# Patient Record
Sex: Male | Born: 1950 | Race: White | Hispanic: No | Marital: Single | Smoking: Former smoker
Health system: Southern US, Community
[De-identification: ages and names within clinical notes are randomized; demographics above are authoritative.]

## PROBLEM LIST (undated history)

## (undated) DIAGNOSIS — R7303 Prediabetes: Principal | ICD-10-CM

## (undated) DIAGNOSIS — R0600 Dyspnea, unspecified: Secondary | ICD-10-CM

## (undated) DIAGNOSIS — J189 Pneumonia, unspecified organism: Secondary | ICD-10-CM

## (undated) DIAGNOSIS — M1711 Unilateral primary osteoarthritis, right knee: Secondary | ICD-10-CM

## (undated) DIAGNOSIS — I25119 Atherosclerotic heart disease of native coronary artery with unspecified angina pectoris: Secondary | ICD-10-CM

## (undated) DIAGNOSIS — I5022 Chronic systolic (congestive) heart failure: Secondary | ICD-10-CM

## (undated) DIAGNOSIS — I2510 Atherosclerotic heart disease of native coronary artery without angina pectoris: Secondary | ICD-10-CM

## (undated) DIAGNOSIS — N5089 Other specified disorders of the male genital organs: Secondary | ICD-10-CM

## (undated) DIAGNOSIS — N400 Enlarged prostate without lower urinary tract symptoms: Secondary | ICD-10-CM

## (undated) DIAGNOSIS — I499 Cardiac arrhythmia, unspecified: Secondary | ICD-10-CM

## (undated) DIAGNOSIS — Z8719 Personal history of other diseases of the digestive system: Secondary | ICD-10-CM

## (undated) DIAGNOSIS — E785 Hyperlipidemia, unspecified: Secondary | ICD-10-CM

## (undated) DIAGNOSIS — N183 Chronic kidney disease, stage 3 unspecified: Secondary | ICD-10-CM

## (undated) DIAGNOSIS — M1990 Unspecified osteoarthritis, unspecified site: Secondary | ICD-10-CM

## (undated) DIAGNOSIS — I10 Essential (primary) hypertension: Secondary | ICD-10-CM

## (undated) HISTORY — DX: Prediabetes: R73.03

## (undated) HISTORY — DX: Personal history of other diseases of the digestive system: Z87.19

## (undated) HISTORY — DX: Atherosclerotic heart disease of native coronary artery with unspecified angina pectoris: I25.119

## (undated) HISTORY — DX: Other specified disorders of the male genital organs: N50.89

## (undated) HISTORY — DX: Unilateral primary osteoarthritis, right knee: M17.11

## (undated) HISTORY — DX: Atherosclerotic heart disease of native coronary artery without angina pectoris: I25.10

---

## 1989-01-28 HISTORY — PX: INGUINAL HERNIA REPAIR: SUR1180

## 2001-02-06 ENCOUNTER — Encounter: Payer: Self-pay | Admitting: Emergency Medicine

## 2001-02-06 ENCOUNTER — Emergency Department (HOSPITAL_COMMUNITY): Admission: EM | Admit: 2001-02-06 | Discharge: 2001-02-06 | Payer: Self-pay | Admitting: Emergency Medicine

## 2001-02-07 ENCOUNTER — Encounter: Payer: Self-pay | Admitting: *Deleted

## 2001-02-07 ENCOUNTER — Inpatient Hospital Stay (HOSPITAL_COMMUNITY): Admission: EM | Admit: 2001-02-07 | Discharge: 2001-02-12 | Payer: Self-pay | Admitting: Emergency Medicine

## 2004-11-08 ENCOUNTER — Emergency Department (HOSPITAL_COMMUNITY): Admission: EM | Admit: 2004-11-08 | Discharge: 2004-11-08 | Payer: Self-pay | Admitting: Emergency Medicine

## 2004-12-15 ENCOUNTER — Ambulatory Visit: Payer: Self-pay | Admitting: *Deleted

## 2004-12-15 ENCOUNTER — Ambulatory Visit: Payer: Self-pay | Admitting: Internal Medicine

## 2004-12-29 ENCOUNTER — Ambulatory Visit: Payer: Self-pay | Admitting: Internal Medicine

## 2005-01-21 ENCOUNTER — Ambulatory Visit: Payer: Self-pay | Admitting: Internal Medicine

## 2005-02-09 ENCOUNTER — Encounter: Payer: Self-pay | Admitting: Cardiology

## 2005-02-09 ENCOUNTER — Ambulatory Visit: Payer: Self-pay | Admitting: Cardiology

## 2005-02-09 ENCOUNTER — Ambulatory Visit (HOSPITAL_COMMUNITY): Admission: RE | Admit: 2005-02-09 | Discharge: 2005-02-09 | Payer: Self-pay | Admitting: Internal Medicine

## 2005-03-25 ENCOUNTER — Ambulatory Visit: Payer: Self-pay | Admitting: Internal Medicine

## 2005-03-25 LAB — CONVERTED CEMR LAB: PSA: 4.14 ng/mL

## 2005-05-20 ENCOUNTER — Ambulatory Visit: Payer: Self-pay | Admitting: Internal Medicine

## 2005-05-31 ENCOUNTER — Ambulatory Visit: Payer: Self-pay | Admitting: Internal Medicine

## 2005-06-23 ENCOUNTER — Ambulatory Visit: Payer: Self-pay | Admitting: Internal Medicine

## 2005-06-23 LAB — CONVERTED CEMR LAB: PSA: 4.71 ng/mL

## 2005-10-24 ENCOUNTER — Emergency Department (HOSPITAL_COMMUNITY): Admission: EM | Admit: 2005-10-24 | Discharge: 2005-10-24 | Payer: Self-pay | Admitting: Emergency Medicine

## 2006-02-20 ENCOUNTER — Ambulatory Visit: Payer: Self-pay | Admitting: Internal Medicine

## 2006-02-20 LAB — CONVERTED CEMR LAB
Hgb A1c MFr Bld: 5.4 %
Microalbumin U total vol: 3.54 mg/L

## 2006-03-22 ENCOUNTER — Ambulatory Visit: Payer: Self-pay | Admitting: Internal Medicine

## 2006-03-27 ENCOUNTER — Ambulatory Visit: Payer: Self-pay | Admitting: Internal Medicine

## 2006-05-08 ENCOUNTER — Ambulatory Visit: Payer: Self-pay | Admitting: Internal Medicine

## 2006-08-16 ENCOUNTER — Emergency Department (HOSPITAL_COMMUNITY): Admission: EM | Admit: 2006-08-16 | Discharge: 2006-08-16 | Payer: Self-pay | Admitting: Emergency Medicine

## 2006-09-25 ENCOUNTER — Ambulatory Visit: Payer: Self-pay | Admitting: Internal Medicine

## 2006-09-25 LAB — CONVERTED CEMR LAB: Triglycerides: 556 mg/dL

## 2006-12-31 ENCOUNTER — Emergency Department (HOSPITAL_COMMUNITY): Admission: EM | Admit: 2006-12-31 | Discharge: 2006-12-31 | Payer: Self-pay | Admitting: Emergency Medicine

## 2007-01-04 ENCOUNTER — Encounter (INDEPENDENT_AMBULATORY_CARE_PROVIDER_SITE_OTHER): Payer: Self-pay | Admitting: Internal Medicine

## 2007-01-04 ENCOUNTER — Ambulatory Visit: Payer: Self-pay | Admitting: Internal Medicine

## 2007-01-10 ENCOUNTER — Ambulatory Visit: Payer: Self-pay | Admitting: Internal Medicine

## 2007-01-16 ENCOUNTER — Encounter (INDEPENDENT_AMBULATORY_CARE_PROVIDER_SITE_OTHER): Payer: Self-pay | Admitting: Internal Medicine

## 2007-01-16 DIAGNOSIS — E119 Type 2 diabetes mellitus without complications: Secondary | ICD-10-CM | POA: Insufficient documentation

## 2007-01-16 DIAGNOSIS — I10 Essential (primary) hypertension: Secondary | ICD-10-CM

## 2007-01-16 DIAGNOSIS — K8020 Calculus of gallbladder without cholecystitis without obstruction: Secondary | ICD-10-CM | POA: Insufficient documentation

## 2007-01-16 DIAGNOSIS — I509 Heart failure, unspecified: Secondary | ICD-10-CM

## 2007-01-16 DIAGNOSIS — E785 Hyperlipidemia, unspecified: Secondary | ICD-10-CM

## 2007-01-16 DIAGNOSIS — K5730 Diverticulosis of large intestine without perforation or abscess without bleeding: Secondary | ICD-10-CM | POA: Insufficient documentation

## 2007-01-16 DIAGNOSIS — N400 Enlarged prostate without lower urinary tract symptoms: Secondary | ICD-10-CM | POA: Insufficient documentation

## 2007-01-17 ENCOUNTER — Ambulatory Visit: Payer: Self-pay | Admitting: Internal Medicine

## 2007-02-06 DIAGNOSIS — F329 Major depressive disorder, single episode, unspecified: Secondary | ICD-10-CM

## 2007-02-14 ENCOUNTER — Encounter (INDEPENDENT_AMBULATORY_CARE_PROVIDER_SITE_OTHER): Payer: Self-pay | Admitting: *Deleted

## 2007-03-06 ENCOUNTER — Telehealth (INDEPENDENT_AMBULATORY_CARE_PROVIDER_SITE_OTHER): Payer: Self-pay | Admitting: *Deleted

## 2007-03-13 ENCOUNTER — Ambulatory Visit: Payer: Self-pay | Admitting: Internal Medicine

## 2007-03-13 DIAGNOSIS — F528 Other sexual dysfunction not due to a substance or known physiological condition: Secondary | ICD-10-CM | POA: Insufficient documentation

## 2007-03-22 ENCOUNTER — Telehealth (INDEPENDENT_AMBULATORY_CARE_PROVIDER_SITE_OTHER): Payer: Self-pay | Admitting: Internal Medicine

## 2007-03-28 ENCOUNTER — Ambulatory Visit: Payer: Self-pay | Admitting: Internal Medicine

## 2007-05-02 ENCOUNTER — Encounter (INDEPENDENT_AMBULATORY_CARE_PROVIDER_SITE_OTHER): Payer: Self-pay | Admitting: Internal Medicine

## 2007-08-21 DIAGNOSIS — Z8719 Personal history of other diseases of the digestive system: Secondary | ICD-10-CM

## 2007-08-21 DIAGNOSIS — E039 Hypothyroidism, unspecified: Secondary | ICD-10-CM | POA: Insufficient documentation

## 2007-08-21 HISTORY — DX: Personal history of other diseases of the digestive system: Z87.19

## 2007-08-22 ENCOUNTER — Inpatient Hospital Stay (HOSPITAL_COMMUNITY): Admission: EM | Admit: 2007-08-22 | Discharge: 2007-08-24 | Payer: Self-pay | Admitting: Emergency Medicine

## 2007-08-24 ENCOUNTER — Encounter (INDEPENDENT_AMBULATORY_CARE_PROVIDER_SITE_OTHER): Payer: Self-pay | Admitting: Internal Medicine

## 2007-10-02 ENCOUNTER — Ambulatory Visit: Payer: Self-pay | Admitting: Internal Medicine

## 2007-10-02 DIAGNOSIS — G4700 Insomnia, unspecified: Secondary | ICD-10-CM

## 2007-10-02 LAB — CONVERTED CEMR LAB: Hgb A1c MFr Bld: 5.5 %

## 2007-10-14 LAB — CONVERTED CEMR LAB
Calcium: 9.2 mg/dL (ref 8.4–10.5)
Creatinine, Ser: 1.24 mg/dL (ref 0.40–1.50)
Glucose, Bld: 124 mg/dL — ABNORMAL HIGH (ref 70–99)
Potassium: 3.7 meq/L (ref 3.5–5.3)

## 2007-10-15 ENCOUNTER — Encounter (INDEPENDENT_AMBULATORY_CARE_PROVIDER_SITE_OTHER): Payer: Self-pay | Admitting: *Deleted

## 2007-10-16 ENCOUNTER — Ambulatory Visit: Payer: Self-pay | Admitting: Internal Medicine

## 2007-10-29 ENCOUNTER — Telehealth (INDEPENDENT_AMBULATORY_CARE_PROVIDER_SITE_OTHER): Payer: Self-pay | Admitting: Internal Medicine

## 2008-02-02 ENCOUNTER — Ambulatory Visit: Payer: Self-pay | Admitting: Internal Medicine

## 2008-02-02 ENCOUNTER — Observation Stay (HOSPITAL_COMMUNITY): Admission: EM | Admit: 2008-02-02 | Discharge: 2008-02-04 | Payer: Self-pay | Admitting: Emergency Medicine

## 2008-02-05 ENCOUNTER — Telehealth (INDEPENDENT_AMBULATORY_CARE_PROVIDER_SITE_OTHER): Payer: Self-pay | Admitting: *Deleted

## 2008-02-10 ENCOUNTER — Encounter (INDEPENDENT_AMBULATORY_CARE_PROVIDER_SITE_OTHER): Payer: Self-pay | Admitting: Internal Medicine

## 2008-02-19 ENCOUNTER — Ambulatory Visit: Payer: Self-pay | Admitting: Internal Medicine

## 2008-02-19 LAB — CONVERTED CEMR LAB
Blood Glucose, Fingerstick: 160
Calcium: 9.7 mg/dL (ref 8.4–10.5)
Glucose, Bld: 142 mg/dL — ABNORMAL HIGH (ref 70–99)
Hgb A1c MFr Bld: 5.8 %
Sodium: 145 meq/L (ref 135–145)
TSH: 1.048 microintl units/mL (ref 0.350–4.50)

## 2008-02-26 ENCOUNTER — Telehealth (INDEPENDENT_AMBULATORY_CARE_PROVIDER_SITE_OTHER): Payer: Self-pay | Admitting: Internal Medicine

## 2008-03-06 ENCOUNTER — Telehealth (INDEPENDENT_AMBULATORY_CARE_PROVIDER_SITE_OTHER): Payer: Self-pay | Admitting: Internal Medicine

## 2008-03-27 ENCOUNTER — Ambulatory Visit: Payer: Self-pay | Admitting: Internal Medicine

## 2008-03-31 ENCOUNTER — Telehealth (INDEPENDENT_AMBULATORY_CARE_PROVIDER_SITE_OTHER): Payer: Self-pay | Admitting: Internal Medicine

## 2008-04-30 ENCOUNTER — Ambulatory Visit: Payer: Self-pay | Admitting: Internal Medicine

## 2008-04-30 LAB — CONVERTED CEMR LAB
Albumin: 4.9 g/dL (ref 3.5–5.2)
Alkaline Phosphatase: 66 units/L (ref 39–117)
BUN: 18 mg/dL (ref 6–23)
Calcium: 9.5 mg/dL (ref 8.4–10.5)
Cholesterol: 149 mg/dL (ref 0–200)
Creatinine, Ser: 1.17 mg/dL (ref 0.40–1.50)
Glucose, Bld: 152 mg/dL — ABNORMAL HIGH (ref 70–99)
LDL Cholesterol: 82 mg/dL (ref 0–99)
Microalb, Ur: 4.33 mg/dL — ABNORMAL HIGH (ref 0.00–1.89)
Total Bilirubin: 0.8 mg/dL (ref 0.3–1.2)
Total CHOL/HDL Ratio: 4
Triglycerides: 152 mg/dL — ABNORMAL HIGH (ref <150)

## 2008-05-02 ENCOUNTER — Ambulatory Visit: Payer: Self-pay | Admitting: Internal Medicine

## 2008-05-02 DIAGNOSIS — H3530 Unspecified macular degeneration: Secondary | ICD-10-CM | POA: Insufficient documentation

## 2008-05-09 ENCOUNTER — Ambulatory Visit (HOSPITAL_COMMUNITY): Admission: RE | Admit: 2008-05-09 | Discharge: 2008-05-09 | Payer: Self-pay | Admitting: Internal Medicine

## 2008-05-09 ENCOUNTER — Ambulatory Visit: Payer: Self-pay | Admitting: Cardiovascular Disease

## 2008-05-09 ENCOUNTER — Encounter (INDEPENDENT_AMBULATORY_CARE_PROVIDER_SITE_OTHER): Payer: Self-pay | Admitting: Internal Medicine

## 2008-05-14 ENCOUNTER — Telehealth (INDEPENDENT_AMBULATORY_CARE_PROVIDER_SITE_OTHER): Payer: Self-pay | Admitting: Internal Medicine

## 2008-05-14 ENCOUNTER — Encounter (INDEPENDENT_AMBULATORY_CARE_PROVIDER_SITE_OTHER): Payer: Self-pay | Admitting: Internal Medicine

## 2008-05-26 ENCOUNTER — Ambulatory Visit: Payer: Self-pay | Admitting: Cardiology

## 2008-06-06 ENCOUNTER — Ambulatory Visit (HOSPITAL_COMMUNITY): Admission: RE | Admit: 2008-06-06 | Discharge: 2008-06-06 | Payer: Self-pay | Admitting: Cardiology

## 2008-06-06 ENCOUNTER — Ambulatory Visit: Payer: Self-pay | Admitting: Cardiology

## 2008-07-23 ENCOUNTER — Telehealth (INDEPENDENT_AMBULATORY_CARE_PROVIDER_SITE_OTHER): Payer: Self-pay | Admitting: Internal Medicine

## 2008-10-30 ENCOUNTER — Encounter (INDEPENDENT_AMBULATORY_CARE_PROVIDER_SITE_OTHER): Payer: Self-pay | Admitting: Internal Medicine

## 2008-12-01 ENCOUNTER — Emergency Department (HOSPITAL_COMMUNITY): Admission: EM | Admit: 2008-12-01 | Discharge: 2008-12-01 | Payer: Self-pay | Admitting: Emergency Medicine

## 2008-12-02 ENCOUNTER — Telehealth (INDEPENDENT_AMBULATORY_CARE_PROVIDER_SITE_OTHER): Payer: Self-pay | Admitting: Internal Medicine

## 2008-12-15 ENCOUNTER — Telehealth (INDEPENDENT_AMBULATORY_CARE_PROVIDER_SITE_OTHER): Payer: Self-pay | Admitting: Internal Medicine

## 2008-12-15 ENCOUNTER — Ambulatory Visit: Payer: Self-pay | Admitting: Internal Medicine

## 2008-12-16 ENCOUNTER — Encounter (INDEPENDENT_AMBULATORY_CARE_PROVIDER_SITE_OTHER): Payer: Self-pay | Admitting: Internal Medicine

## 2008-12-16 LAB — CONVERTED CEMR LAB
Basophils Absolute: 0 10*3/uL (ref 0.0–0.1)
Lymphocytes Relative: 15 % (ref 12–46)
Lymphs Abs: 1.1 10*3/uL (ref 0.7–4.0)
MCHC: 32.7 g/dL (ref 30.0–36.0)
MCV: 88.2 fL (ref 78.0–100.0)
Monocytes Relative: 8 % (ref 3–12)
Neutrophils Relative %: 76 % (ref 43–77)
RBC: 5.1 M/uL (ref 4.22–5.81)
RDW: 14.8 % (ref 11.5–15.5)
WBC: 7.3 10*3/uL (ref 4.0–10.5)

## 2008-12-26 ENCOUNTER — Ambulatory Visit: Payer: Self-pay | Admitting: Internal Medicine

## 2008-12-26 DIAGNOSIS — R3129 Other microscopic hematuria: Secondary | ICD-10-CM

## 2008-12-26 LAB — CONVERTED CEMR LAB
Bacteria, UA: NONE SEEN
Blood in Urine, dipstick: NEGATIVE
Glucose, Urine, Semiquant: NEGATIVE
Hgb A1c MFr Bld: 5.6 %
Ketones, urine, test strip: NEGATIVE
RBC / HPF: NONE SEEN (ref <3)
WBC, UA: NONE SEEN cells/hpf (ref <3)

## 2009-01-13 ENCOUNTER — Emergency Department (HOSPITAL_COMMUNITY): Admission: EM | Admit: 2009-01-13 | Discharge: 2009-01-13 | Payer: Self-pay | Admitting: Emergency Medicine

## 2009-01-26 ENCOUNTER — Ambulatory Visit: Payer: Self-pay | Admitting: Cardiology

## 2009-02-02 LAB — CONVERTED CEMR LAB
GFR calc non Af Amer: 65.5 mL/min (ref >60)
Potassium: 3.7 meq/L (ref 3.5–5.1)
Sodium: 140 meq/L (ref 135–145)

## 2009-02-06 ENCOUNTER — Ambulatory Visit: Payer: Self-pay | Admitting: Internal Medicine

## 2009-02-23 LAB — CONVERTED CEMR LAB
ALT: 20 units/L (ref 0–53)
AST: 20 units/L (ref 0–37)
Bilirubin, Direct: 0.3 mg/dL (ref 0.0–0.3)
Cholesterol: 88 mg/dL (ref 0–200)
HDL: 41 mg/dL (ref >39)
Total CHOL/HDL Ratio: 2.1
Triglycerides: 180 mg/dL — ABNORMAL HIGH (ref <150)

## 2009-03-02 ENCOUNTER — Ambulatory Visit: Payer: Self-pay | Admitting: Internal Medicine

## 2009-03-11 ENCOUNTER — Emergency Department (HOSPITAL_COMMUNITY): Admission: EM | Admit: 2009-03-11 | Discharge: 2009-03-11 | Payer: Self-pay | Admitting: Emergency Medicine

## 2009-03-24 ENCOUNTER — Encounter: Admission: RE | Admit: 2009-03-24 | Discharge: 2009-03-24 | Payer: Self-pay | Admitting: Chiropractic Medicine

## 2009-03-26 ENCOUNTER — Ambulatory Visit: Payer: Self-pay | Admitting: Cardiology

## 2009-04-27 ENCOUNTER — Ambulatory Visit: Payer: Self-pay | Admitting: Internal Medicine

## 2009-05-12 ENCOUNTER — Ambulatory Visit: Payer: Self-pay | Admitting: Cardiology

## 2009-05-18 LAB — CONVERTED CEMR LAB
Cholesterol: 108 mg/dL (ref 0–200)
VLDL: 35 mg/dL (ref 0–40)

## 2009-07-06 ENCOUNTER — Telehealth (INDEPENDENT_AMBULATORY_CARE_PROVIDER_SITE_OTHER): Payer: Self-pay | Admitting: Internal Medicine

## 2009-07-14 ENCOUNTER — Encounter (INDEPENDENT_AMBULATORY_CARE_PROVIDER_SITE_OTHER): Payer: Self-pay | Admitting: *Deleted

## 2009-07-24 ENCOUNTER — Ambulatory Visit: Payer: Self-pay | Admitting: Internal Medicine

## 2009-08-03 LAB — CONVERTED CEMR LAB
AST: 14 units/L (ref 0–37)
Albumin: 4.6 g/dL (ref 3.5–5.2)
Alkaline Phosphatase: 75 units/L (ref 39–117)
HDL: 37 mg/dL — ABNORMAL LOW (ref >39)
LDL Cholesterol: 21 mg/dL (ref 0–99)
Total Protein: 7.1 g/dL (ref 6.0–8.3)
Triglycerides: 134 mg/dL (ref <150)

## 2009-10-13 ENCOUNTER — Telehealth (INDEPENDENT_AMBULATORY_CARE_PROVIDER_SITE_OTHER): Payer: Self-pay | Admitting: Internal Medicine

## 2010-01-26 ENCOUNTER — Ambulatory Visit: Payer: Self-pay | Admitting: Internal Medicine

## 2010-01-26 ENCOUNTER — Telehealth (INDEPENDENT_AMBULATORY_CARE_PROVIDER_SITE_OTHER): Payer: Self-pay | Admitting: Internal Medicine

## 2010-01-26 DIAGNOSIS — M25569 Pain in unspecified knee: Secondary | ICD-10-CM

## 2010-01-26 DIAGNOSIS — R011 Cardiac murmur, unspecified: Secondary | ICD-10-CM

## 2010-02-03 ENCOUNTER — Ambulatory Visit (HOSPITAL_COMMUNITY): Admission: RE | Admit: 2010-02-03 | Discharge: 2010-02-03 | Payer: Self-pay | Admitting: Internal Medicine

## 2010-02-03 ENCOUNTER — Ambulatory Visit: Payer: Self-pay | Admitting: Internal Medicine

## 2010-02-09 ENCOUNTER — Telehealth (INDEPENDENT_AMBULATORY_CARE_PROVIDER_SITE_OTHER): Payer: Self-pay | Admitting: Internal Medicine

## 2010-02-12 ENCOUNTER — Ambulatory Visit: Payer: Self-pay | Admitting: Cardiology

## 2010-02-15 ENCOUNTER — Telehealth (INDEPENDENT_AMBULATORY_CARE_PROVIDER_SITE_OTHER): Payer: Self-pay | Admitting: Internal Medicine

## 2010-02-15 LAB — CONVERTED CEMR LAB
ALT: 14 units/L (ref 0–53)
AST: 14 units/L (ref 0–37)
Albumin: 4.6 g/dL (ref 3.5–5.2)
Basophils Absolute: 0 10*3/uL (ref 0.0–0.1)
Basophils Relative: 0 % (ref 0–1)
Calcium: 9.3 mg/dL (ref 8.4–10.5)
Chloride: 102 meq/L (ref 96–112)
MCHC: 34.8 g/dL (ref 30.0–36.0)
Neutro Abs: 7.5 10*3/uL (ref 1.7–7.7)
Neutrophils Relative %: 73 % (ref 43–77)
PSA: 1.76 ng/mL (ref 0.10–4.00)
Platelets: 148 10*3/uL — ABNORMAL LOW (ref 150–400)
Potassium: 3.9 meq/L (ref 3.5–5.3)
RBC: 4.9 M/uL (ref 4.22–5.81)
RDW: 15.1 % (ref 11.5–15.5)
Sodium: 143 meq/L (ref 135–145)
Total CHOL/HDL Ratio: 2.1
VLDL: 39 mg/dL (ref 0–40)

## 2010-03-09 ENCOUNTER — Ambulatory Visit: Payer: Self-pay | Admitting: Internal Medicine

## 2010-04-08 ENCOUNTER — Telehealth (INDEPENDENT_AMBULATORY_CARE_PROVIDER_SITE_OTHER): Payer: Self-pay | Admitting: Internal Medicine

## 2010-04-16 ENCOUNTER — Ambulatory Visit: Payer: Self-pay | Admitting: Internal Medicine

## 2010-04-16 LAB — CONVERTED CEMR LAB
Cholesterol: 153 mg/dL (ref 0–200)
HDL: 35 mg/dL — ABNORMAL LOW (ref >39)
Triglycerides: 221 mg/dL — ABNORMAL HIGH (ref <150)
VLDL: 44 mg/dL — ABNORMAL HIGH (ref 0–40)

## 2010-04-19 ENCOUNTER — Encounter
Admission: RE | Admit: 2010-04-19 | Discharge: 2010-05-29 | Payer: Self-pay | Source: Home / Self Care | Attending: Internal Medicine | Admitting: Internal Medicine

## 2010-04-20 ENCOUNTER — Ambulatory Visit: Payer: Self-pay | Admitting: Internal Medicine

## 2010-06-04 ENCOUNTER — Ambulatory Visit
Admission: RE | Admit: 2010-06-04 | Discharge: 2010-06-04 | Payer: Self-pay | Source: Home / Self Care | Attending: Cardiology | Admitting: Cardiology

## 2010-06-04 ENCOUNTER — Encounter: Payer: Self-pay | Admitting: Cardiology

## 2010-06-07 ENCOUNTER — Encounter
Admission: RE | Admit: 2010-06-07 | Discharge: 2010-06-28 | Payer: Self-pay | Source: Home / Self Care | Attending: Internal Medicine | Admitting: Internal Medicine

## 2010-06-07 ENCOUNTER — Encounter (INDEPENDENT_AMBULATORY_CARE_PROVIDER_SITE_OTHER): Payer: Self-pay | Admitting: Internal Medicine

## 2010-06-11 ENCOUNTER — Encounter (INDEPENDENT_AMBULATORY_CARE_PROVIDER_SITE_OTHER): Payer: Self-pay | Admitting: Internal Medicine

## 2010-06-11 ENCOUNTER — Ambulatory Visit
Admission: RE | Admit: 2010-06-11 | Discharge: 2010-06-11 | Payer: Self-pay | Source: Home / Self Care | Attending: Internal Medicine | Admitting: Internal Medicine

## 2010-06-11 LAB — CONVERTED CEMR LAB
AST: 13 units/L (ref 0–37)
Albumin: 5.1 g/dL (ref 3.5–5.2)
Alkaline Phosphatase: 70 units/L (ref 39–117)
Blood Glucose, Fingerstick: 139
HDL: 42 mg/dL (ref >39)
Total CHOL/HDL Ratio: 3
Total Protein: 7.3 g/dL (ref 6.0–8.3)
Triglycerides: 135 mg/dL (ref <150)

## 2010-06-16 ENCOUNTER — Encounter (INDEPENDENT_AMBULATORY_CARE_PROVIDER_SITE_OTHER): Payer: Self-pay | Admitting: Internal Medicine

## 2010-06-22 ENCOUNTER — Encounter: Admit: 2010-06-22 | Payer: Self-pay | Admitting: Internal Medicine

## 2010-06-29 ENCOUNTER — Encounter (INDEPENDENT_AMBULATORY_CARE_PROVIDER_SITE_OTHER): Payer: Self-pay | Admitting: Internal Medicine

## 2010-07-01 NOTE — Letter (Signed)
Summary: [facility]  [facility]   Imported By: Arta Bruce 04/29/2010 11:20:37  _____________________________________________________________________  External Attachment:    Type:   Image     Comment:   External Document

## 2010-07-01 NOTE — Progress Notes (Signed)
Summary: [facility] referral   Phone Note Outgoing Call   Summary of Call: [name]--see if can get into [facility]--has macular degeneration and has not been to an eye doctor in 3 years. Initial call taken by: Julieanne Manson MD,  January 26, 2010 1:13 PM  Follow-up for Phone Call        I LVM TO PT TO CALL ME REGARDING TO HIS REFERRALL .Marland KitchenCheryll Dessert  February 10, 2010 1:12 PM I SPOKE TO [facility] THEY TOLD THAT THEY CAN'T HELP ME BECAUSE HE IS NOT BLIND  AND THEY GIVE ME THE PH # FOR A [employer]IAL [employer] I SPOKE WITH LAKEISHA CLEMMONS SHE TOLD ME THAT CAN'T HELP ME AND SHE REFER ME TO [facility] AND THEY ONLY DO SURGERY REFERRAL FROM [place]  WITH THE [facility] CARD . THE ONLY THING THAT CAME TO MY MIND IS REFERRAL TO DR GROAT $100 VISIT AND AFTER THAT IT WILL BE PAYMENT PLAN .  Follow-up by: Cheryll Dessert,  February 11, 2010 12:21 PM  Additional Follow-up for Phone Call Additional follow up Details #1::        See if Patrick [name] is willing to pay the charges to be seen by Dr. Dione Booze as noted above Additional Follow-up by: Julieanne Manson MD,  February 11, 2010 10:38 PM    Additional Follow-up for Phone Call Additional follow up Details #2::    pt will be unable to afford ANY payments as he has no income. Sister is helping with all his bills right now, she would not be able to help with anything additional. may need to hold referral until family is able to save funds. Follow-up by: Michelle Nasuti,  February 12, 2010 12:47 PM  Additional Follow-up for Phone Call Additional follow up Details #3:: Details for Additional Follow-up Action Taken: [name]--could you see if Patrick [name] could go to [facility] if we could arrange it?  If he is able to do so, could you look into how much it would cost with charity care--thanks Julieanne Manson MD  March 23, 2010 11:15 PM  *[facility] charges $100 the first visit and after they can apply for  Financial  Assistance Program ..Cheryll Dessert  March 24, 2010 8:48 AM Did you share this info with Patrick [name]?  If he is willing to do this, please set up.  Is there anywhere else that Patrick [name] st is getting folks for eye care in?  Julieanne Manson MD  April 07, 2010 10:30 AM  *I talk to Patrick [name] she said that he can't pay $100 andher sister is helping himwith the bills and Patrick [name] Emeril't do that no more just referrals with [facility] .Marland KitchenCheryll Dessert  April 08, 2010 10:22 AM I talk to him and he will let ud know .Marland KitchenCheryll Dessert  April 28, 2010 4:12 PM   Okay--thanks for looking into this.  Please let Patrick [name] know to call us when he thinks he can afford the $100.  Julieanne Manson MD  April 27, 2010 2:48 PM

## 2010-07-01 NOTE — Progress Notes (Signed)
Summary: WANTS KNEE X-RAY RESULTS  Phone Note Call from Patient Call back at Home Phone [phone] Call back at OR CELL [phone]   Reason for Call: Lab or Test Results Summary of Call: [name] PT. Patrick [name] CALLED TO GET HIS KNEE X-RAY RESULTS. HE SAYS THAT THERE IS COMETHING GOING TON WITH HIS KNEE THAT CONCERNS HIM. Initial call taken by: Leodis Rains,  February 09, 2010 2:49 PM  Follow-up for Phone Call        Left message on answering machine for pt. to return call.  Dutch Quint RN  February 09, 2010 3:54 PM  Having a lot of pain with his right knee.  When he gets up and puts weight on it, or tries to strengthen it, or leg has been bent, has severe pain.  Wants to know if xray shows anything. Follow-up by: Dutch Quint RN,  February 10, 2010 10:08 AM  Additional Follow-up for Phone Call Additional follow up Details #1::        Patrick Becht CALLED AGAIN TO SEE IF HE  CAN GET HIS KNEE X-RAHY RESULTS. Additional Follow-up by: Leodis Rains,  February 11, 2010 3:37 PM    Additional Follow-up for Phone Call Additional follow up Details #2::    Please see append to his labs--it appears he did not answer his phone--was a letter sent with my requested changes and information regarding lab and Xray results? If changes with meds and other information has not yet been passed on to him, then please do so--see the append as above. Please try pt. again and let him know we tried to contact him previously regarding this. If he feels his knee is much worse, he can come in to discuss further evaluation and treatment, but appears he has some arthritis.  Julieanne Manson MD  February 11, 2010 10:55 PM   Pt. advised of provider's instructions and lab results as per append.  I did not see where a letter had been sent; advised pt. of prior attempts at notification.  Repeat FLP appt. 04/16/10.  Dutch Quint RN  February 12, 2010 12:24 PM

## 2010-07-01 NOTE — Assessment & Plan Note (Signed)
Summary: 3 MONTH FU ON RIGHT KNEE PAIN//[facility]   Vital Signs:  Patient profile:   60 year old male Weight:      227 pounds Temp:     97 degrees F oral Pulse rate:   76 / minute Pulse rhythm:   regular Resp:     22 per minute BP sitting:   118 / 78  (left arm) Cuff size:   regular  Vitals Entered By: Hale Drone CMA (June 11, 2010 2:38 PM) CC: 3 month f/u on right knee pain. Would like a Rx for ibuprofen and would like to know if you would approve him for a Handicapp sticker.  Is Patient Diabetic? Yes Pain Assessment Patient in pain? yes     Location: right knee Intensity: 2 Type: aching Onset of pain  Constant CBG Result 139 CBG Device ID A fasting  Does patient need assistance? Functional Status Self care Ambulation Normal   Primary Care Provider:  Julieanne Manson MD  CC:  3 month f/u on right knee pain. Would like a Rx for ibuprofen and would like to know if you would approve him for a Handicapp sticker. Marland Kitchen  History of Present Illness: 1.  Insomnia:  Taking 100 mg of Trazodone at bedtime and sleep is much better.  Is getting out and walking more, exercises in the house for which he was instructed by PT.  Volunteering in a thrift store with a [employer].  Feels much better now he has something he is excited to do.    2.  Knee pain:  Still doing physical therapy.  Pain markedly better when walks.  Still difficulty with hip.  They are working on the hip now.  Hip pain suspected to be related to initial knee problems.  3.  Dyslipidemia:  Is fasting despite it being afternoon.  Taking 1/2 Lipitor 10 mg tab every other day.  Tolerating fine.  Still taking Niaspan  and Lovaza as well.  Current Medications (verified): 1)  Lisinopril 40 Mg  Tabs (Lisinopril) .Marland Kitchen.. 1 Tab By Mouth Daily 2)  Celexa 40 Mg Tabs (Citalopram Hydrobromide) .Marland Kitchen.. 1 By Mouth Once Daily 3)  Aspirin 325 Mg Tabs (Aspirin) .Marland Kitchen.. 1 By Mouth Once Daily 4)  Niaspan 500 Mg  Tbcr (Niacin  (Antihyperlipidemic)) .... 4 Tabs By Mouth At Bedtime. 5)  Klor-Con M20 20 Meq  Tbcr (Potassium Chloride Crys Cr) .Marland Kitchen.. 1 By Mouth  Once Daily 6)  Proscar 5 Mg  Tabs (Finasteride) .Marland Kitchen.. 1 Tab By Mouth Daily 7)  Carvedilol 25 Mg Tabs (Carvedilol) .... One By Mouth Bid 8)  Triamterene-Hctz 75-50 Mg  Tabs (Triamterene-Hctz) .... 1/2 Tab By Mouth in Am 9)  Glucometer Elite Classic   Kit (Blood Glucose Monitoring Suppl) .... Use For Glucose Testing Once Daily 250.00 10)  Glucometer Elite Test   Strp (Glucose Blood) .... Use For Daily Glucose Testing 250.00 11)  Wellbutrin Sr 150 Mg Xr12h-Tab (Bupropion Hcl) .... Take 1 Tablet By Mouth Every 12 Hours 12)  Lovaza 1 Gm Caps (Omega-3-Acid Ethyl Esters) .... 4 Caps By Mouth Daily 13)  Norvasc 10 Mg Tabs (Amlodipine Besylate) .Marland Kitchen.. 1 Tab By Mouth Daily 14)  Furosemide 40 Mg Tabs (Furosemide) .Marland Kitchen.. 1 By Mouth Once Daily 15)  Vitamin C Cr 1000 Mg Cr-Tabs (Ascorbic Acid) .... 4 By Mouth Daily 16)  Lipitor 10 Mg Tabs (Atorvastatin Calcium) .... 1/2 Tab By Mouth Every Other Day. 17)  Trazodone Hcl 50 Mg Tabs (Trazodone Hcl) .Marland Kitchen.. 1-2 Tabs By  Mouth At Bedtime For Sleep  Allergies (verified): No Known Drug Allergies  Physical Exam  Lungs:  Normal respiratory effort, chest expands symmetrically. Lungs are clear to auscultation, no crackles or wheezes. Heart:  Normal rate and regular rhythm. S1 and S2 normal without gallop, murmur, click, rub or other extra sounds.  Radial pulses normal and equal Extremities:  No edema.  Still with some pain and limping with right hip and knee pain    Impression & Recommendations:  Problem # 1:  KNEE PAIN, RIGHT (ICD-719.46) Improved. Limit Ibuprofen as already on ASA and concern for bp and renal function His updated medication list for this problem includes:    Aspirin 325 Mg Tabs (Aspirin) .Marland Kitchen... 1 by mouth once daily    Ibuprofen 800 Mg Tabs (Ibuprofen) .Marland Kitchen... 1 tab by mouth three times a day with meals  Problem # 2:   INSOMNIA (ICD-780.52) Much improved with Trazodone and lifestyle changes. Continue  Problem # 3:  DYSLIPIDEMIA (ICD-272.4) Lipitor recently restarted His updated medication list for this problem includes:    Niaspan 500 Mg Tbcr (Niacin (antihyperlipidemic)) .Marland KitchenMarland KitchenMarland KitchenMarland Kitchen 4 tabs by mouth at bedtime.    Lovaza 1 Gm Caps (Omega-3-acid ethyl esters) .Marland KitchenMarland KitchenMarland KitchenMarland Kitchen 4 caps by mouth daily    Lipitor 10 Mg Tabs (Atorvastatin calcium) .Marland Kitchen... 1/2 tab by mouth every other day.  Orders: T-Lipid Profile ([id number])  Problem # 4:  DEPRESSION (ICD-311) Doing well and appears much happier now he is getting out of house. His updated medication list for this problem includes:    Celexa 40 Mg Tabs (Citalopram hydrobromide) .Marland Kitchen... 1 by mouth once daily    Wellbutrin Sr 150 Mg Xr12h-tab (Bupropion hcl) .Marland Kitchen... Take 1 tablet by mouth every 12 hours    Trazodone Hcl 50 Mg Tabs (Trazodone hcl) .Marland Kitchen... 1-2 tabs by mouth at bedtime for sleep  Complete Medication List: 1)  Lisinopril 40 Mg Tabs (Lisinopril) .Marland Kitchen.. 1 tab by mouth daily 2)  Celexa 40 Mg Tabs (Citalopram hydrobromide) .Marland Kitchen.. 1 by mouth once daily 3)  Aspirin 325 Mg Tabs (Aspirin) .Marland Kitchen.. 1 by mouth once daily 4)  Niaspan 500 Mg Tbcr (Niacin (antihyperlipidemic)) .... 4 tabs by mouth at bedtime. 5)  Klor-con M20 20 Meq Tbcr (Potassium chloride crys cr) .Marland Kitchen.. 1 by mouth  once daily 6)  Proscar 5 Mg Tabs (Finasteride) .Marland Kitchen.. 1 tab by mouth daily 7)  Carvedilol 25 Mg Tabs (Carvedilol) .... One by mouth bid 8)  Triamterene-hctz 75-50 Mg Tabs (Triamterene-hctz) .... 1/2 tab by mouth in am 9)  Glucometer Elite Classic Kit (Blood glucose monitoring suppl) .... Use for glucose testing once daily 250.00 10)  Glucometer Elite Test Strp (Glucose blood) .... Use for daily glucose testing 250.00 11)  Wellbutrin Sr 150 Mg Xr12h-tab (Bupropion hcl) .... Take 1 tablet by mouth every 12 hours 12)  Lovaza 1 Gm Caps (Omega-3-acid ethyl esters) .... 4 caps by mouth daily 13)  Norvasc 10 Mg Tabs  (Amlodipine besylate) .Marland Kitchen.. 1 tab by mouth daily 14)  Furosemide 40 Mg Tabs (Furosemide) .Marland Kitchen.. 1 by mouth once daily 15)  Vitamin C Cr 1000 Mg Cr-tabs (Ascorbic acid) .... 4 by mouth daily 16)  Lipitor 10 Mg Tabs (Atorvastatin calcium) .... 1/2 tab by mouth every other day. 17)  Trazodone Hcl 50 Mg Tabs (Trazodone hcl) .Marland Kitchen.. 1-2 tabs by mouth at bedtime for sleep 18)  Ibuprofen 800 Mg Tabs (Ibuprofen) .Marland Kitchen.. 1 tab by mouth three times a day with meals  Other Orders: Capillary Blood Glucose/CBG (69629) T-Hepatic  Function [phone])  Patient Instructions: 1)  Follow up with Dr. Delrae Alfred in 4 months for CPE  Prescriptions: IBUPROFEN 800 MG TABS (IBUPROFEN) 1 tab by mouth three times a day with meals  #90 x 1   Entered and Authorized by:   Julieanne Manson MD   Signed by:   Julieanne Manson MD on 06/11/2010   Method used:   Faxed to ...       [facility] (retail)       [address] [place], [place]  [address]       Ph: [phone]       Fax: [phone]   RxID:   [id number]    Orders Added: 1)  Capillary Blood Glucose/CBG [82948] 2)  Est. Patient Level III [[address]] 3)  T-Lipid Profile [80061-22930] 4)  T-Hepatic Function [66440-34742]

## 2010-07-01 NOTE — Assessment & Plan Note (Signed)
Summary: PT WANT TO DISCUSS PAIN MEDS  / NS   Vital Signs:  Patient profile:   60 year old male Height:      68 inches Weight:      232 pounds Temp:     98.7 degrees F oral Pulse rate:   80 / minute Pulse rhythm:   regular Resp:     20 per minute BP sitting:   140 / 80  (left arm) Cuff size:   large  Vitals Entered By: [place] Shannon (April 20, 2010 8:30 AM) CC: pt is here for leg pain...; VISUAL FOOT EXAM DONE TOENAILS LONG AND THICK Is Patient Diabetic? Yes Did you bring your meter with you today? No Pain Assessment Patient in pain? no      CBG Result 129 CBG Device ID B  Does patient need assistance? Functional Status Self care Ambulation Impaired:Risk for fall   Primary Care Provider:  Julieanne Manson MD  CC:  pt is here for leg pain...; VISUAL FOOT EXAM DONE TOENAILS LONG AND THICK.  History of Present Illness: 1. Insomnia:  has had problems for years--preceded pain issue.  Goes to bed about 1 a.m.  daily.  Generally watching T.V. prior.  Has difficulties initiating and staying asleep.  Lies in bed and tosses and turns.  Sometimes will sit up and reads Bible.  Arises at 10 or 11 a.m.  Not much  exercise during the day.  Drinks 2 glasses of tea daily--otherwise no caffeine.  Takes Celexa at 11 a.m. on arising.    2.  Knee and leg pain:  had first PT evaluation yesterday.  Sometimes right knee and sometimes into the thigh, lateral thigh.   Vicodin was not really that helpful.  Current Medications (verified): 1)  Lisinopril 40 Mg  Tabs (Lisinopril) .Marland Kitchen.. 1 Tab By Mouth Daily 2)  Celexa 40 Mg Tabs (Citalopram Hydrobromide) .Marland Kitchen.. 1 By Mouth Once Daily 3)  Aspirin 325 Mg Tabs (Aspirin) .Marland Kitchen.. 1 By Mouth Once Daily 4)  Niaspan 500 Mg  Tbcr (Niacin (Antihyperlipidemic)) .... 4 Tabs By Mouth At Bedtime. 5)  Klor-Con M20 20 Meq  Tbcr (Potassium Chloride Crys Cr) .Marland Kitchen.. 1 By Mouth  Once Daily 6)  Proscar 5 Mg  Tabs (Finasteride) .Marland Kitchen.. 1 Tab By Mouth Daily 7)  Carvedilol 25 Mg  Tabs (Carvedilol) .... One By Mouth Bid 8)  Triamterene-Hctz 75-50 Mg  Tabs (Triamterene-Hctz) .... 1/2 Tab By Mouth in Am 9)  Glucometer Elite Classic   Kit (Blood Glucose Monitoring Suppl) .... Use For Glucose Testing Once Daily 250.00 10)  Glucometer Elite Test   Strp (Glucose Blood) .... Use For Daily Glucose Testing 250.00 11)  Wellbutrin Sr 150 Mg Xr12h-Tab (Bupropion Hcl) .... Take 1 Tablet By Mouth Every 12 Hours 12)  Lovaza 1 Gm Caps (Omega-3-Acid Ethyl Esters) .... 4 Caps By Mouth Daily 13)  Norvasc 10 Mg Tabs (Amlodipine Besylate) .Marland Kitchen.. 1 Tab By Mouth Daily 14)  Furosemide 40 Mg Tabs (Furosemide) .Marland Kitchen.. 1 By Mouth Once Daily 15)  Vitamin C Cr 1000 Mg Cr-Tabs (Ascorbic Acid) .... 4 By Mouth Daily 16)  Ambien 5 Mg Tabs (Zolpidem Tartrate) .... At Bedtime 17)  Hydrocodone-Acetaminophen 5-500 Mg Tabs (Hydrocodone-Acetaminophen) .Marland Kitchen.. 1 Tab By Mouth At Bedtime For Right Knee Pain  Allergies (verified): No Known Drug Allergies  Physical Exam  General:  NAD Lungs:  Normal respiratory effort, chest expands symmetrically. Lungs are clear to auscultation, no crackles or wheezes. Heart:  Normal rate and regular rhythm. S1 and  S2 normal without gallop, murmur, click, rub or other extra sounds.  Radial pulses normal and equal   Impression & Recommendations:  Problem # 1:  KNEE PAIN, RIGHT (ICD-719.46) Pain meds did not help--no refills Will see if PT helps The following medications were removed from the medication list:    Hydrocodone-acetaminophen 5-500 Mg Tabs (Hydrocodone-acetaminophen) .Marland Kitchen... 1 tab by mouth at bedtime for right knee pain His updated medication list for this problem includes:    Aspirin 325 Mg Tabs (Aspirin) .Marland Kitchen... 1 by mouth once daily  Problem # 2:  INSOMNIA (ICD-780.52) Start Trazodone Long discussion regarding good sleep hygiene His updated medication list for this problem includes:    Ambien 5 Mg Tabs (Zolpidem tartrate) .Marland Kitchen... At bedtime  Problem # 3:   DYSLIPIDEMIA (ICD-272.4) Restart low dose Lipitor as LDL and Trigs up with discontinuation. His updated medication list for this problem includes:    Niaspan 500 Mg Tbcr (Niacin (antihyperlipidemic)) .Marland KitchenMarland KitchenMarland KitchenMarland Kitchen 4 tabs by mouth at bedtime.    Lovaza 1 Gm Caps (Omega-3-acid ethyl esters) .Marland KitchenMarland KitchenMarland KitchenMarland Kitchen 4 caps by mouth daily    Lipitor 10 Mg Tabs (Atorvastatin calcium) .Marland Kitchen... 1/2 tab by mouth every other day.  Complete Medication List: 1)  Lisinopril 40 Mg Tabs (Lisinopril) .Marland Kitchen.. 1 tab by mouth daily 2)  Celexa 40 Mg Tabs (Citalopram hydrobromide) .Marland Kitchen.. 1 by mouth once daily 3)  Aspirin 325 Mg Tabs (Aspirin) .Marland Kitchen.. 1 by mouth once daily 4)  Niaspan 500 Mg Tbcr (Niacin (antihyperlipidemic)) .... 4 tabs by mouth at bedtime. 5)  Klor-con M20 20 Meq Tbcr (Potassium chloride crys cr) .Marland Kitchen.. 1 by mouth  once daily 6)  Proscar 5 Mg Tabs (Finasteride) .Marland Kitchen.. 1 tab by mouth daily 7)  Carvedilol 25 Mg Tabs (Carvedilol) .... One by mouth bid 8)  Triamterene-hctz 75-50 Mg Tabs (Triamterene-hctz) .... 1/2 tab by mouth in am 9)  Glucometer Elite Classic Kit (Blood glucose monitoring suppl) .... Use for glucose testing once daily 250.00 10)  Glucometer Elite Test Strp (Glucose blood) .... Use for daily glucose testing 250.00 11)  Wellbutrin Sr 150 Mg Xr12h-tab (Bupropion hcl) .... Take 1 tablet by mouth every 12 hours 12)  Lovaza 1 Gm Caps (Omega-3-acid ethyl esters) .... 4 caps by mouth daily 13)  Norvasc 10 Mg Tabs (Amlodipine besylate) .Marland Kitchen.. 1 tab by mouth daily 14)  Furosemide 40 Mg Tabs (Furosemide) .Marland Kitchen.. 1 by mouth once daily 15)  Vitamin C Cr 1000 Mg Cr-tabs (Ascorbic acid) .... 4 by mouth daily 16)  Ambien 5 Mg Tabs (Zolpidem tartrate) .... At bedtime 17)  Lipitor 10 Mg Tabs (Atorvastatin calcium) .... 1/2 tab by mouth every other day. 18)  Trazodone Hcl 50 Mg Tabs (Trazodone hcl) .Marland Kitchen.. 1-2 tabs by mouth at bedtime for sleep  Other Orders: Capillary Blood Glucose/CBG ([address])  Patient Instructions: 1)  Keep appt. in  January--come fasting so we can check FLP and liver enzymes. 2)  [facility] and see [facility] qualify for scholarship--WATER EXERCISES!!! 3)  Go to bed at 11 p.m. and up at 8-9 a.m. 4)  Find something you enjoy to do on a regular bases--Volunteer. Prescriptions: TRAZODONE HCL 50 MG TABS (TRAZODONE HCL) 1-2 tabs by mouth at bedtime for sleep  #60 x 11   Entered and Authorized by:   Julieanne Manson MD   Signed by:   Julieanne Manson MD on 04/20/2010   Method used:   Faxed to ...       [facility]ail)  [address] [place], [place]  [address]       Ph: [phone]       Fax: [phone]   RxID:   [id number] LIPITOR 10 MG TABS (ATORVASTATIN CALCIUM) 1/2 tab by mouth every other day.  #8 x 11   Entered and Authorized by:   Julieanne Manson MD   Signed by:   Julieanne Manson MD on 04/20/2010   Method used:   Faxed to ...       [facility] (retail)       [address] [place], [place]  [address]       Ph: [phone]       Fax: [phone]   RxID:   [id number]    Orders Added: 1)  Capillary Blood Glucose/CBG [82948] 2)  Est. Patient Level IV [16010]

## 2010-07-01 NOTE — Progress Notes (Signed)
Summary: Wants something for arthritis pain  Phone Note Call from Patient   Summary of Call: XRAY OF KNEE WAS DONE ABOUT A WEEK AGO//OUR OFFICE CALLED AND SAID IT WAS MILD ARTHRITIS IN KNEE AND PT WAS WANDERING IF SOMETHING COULD BE CALLED IN FOR THE  PAIN ,IT'S REALLY HURTING//[facility]//[phone] OR [phone] Initial call taken by: Arta Bruce,  February 15, 2010 9:33 AM  Follow-up for Phone Call        Sent to E. Mulberry.  Dutch Quint RN  February 15, 2010 12:35 PM   Additional Follow-up for Phone Call Additional follow up Details #1::        MR Odriscoll CALLED AGAIN AND WANTS TO KNOW IF YOU WILL BE WILLING TO CALL HIM IN SOMETHING TO [facility]. Additional Follow-up by: Leodis Rains,  February 18, 2010 2:26 PM    Additional Follow-up for Phone Call Additional follow up Details #2::    Please see previous note asking him to make an appt. to reevaluate with severe pain. I'm assuming Ibuprofen is not helping--please clarify. Follow-up by: Julieanne Manson MD,  February 18, 2010 6:38 PM  Additional Follow-up for Phone Call Additional follow up Details #3:: Details for Additional Follow-up Action Taken: pt aware Michelle Nasuti  February 19, 2010 2:19 PM

## 2010-07-01 NOTE — Miscellaneous (Signed)
Summary: Rehab Report/INITIAL SUMMARY  Rehab Report/INITIAL SUMMARY   Imported By: Arta Bruce 06/11/2010 12:28:03  _____________________________________________________________________  External Attachment:    Type:   Image     Comment:   External Document

## 2010-07-01 NOTE — Letter (Signed)
Summary: *HSN Results Follow up  [facility]  [address] [place], [place] [address]   Phone: [phone]  Fax: [phone]      07/14/2009   F[facility] Hellmann [address] [place], [place]  [address]   Dear  Mr. Lynard Jurczyk,                            ____S.Drinkard,FNP   ____D. Gore,FNP       ____B. McPherson,MD   ____V. Rankins,MD    __xx__E. Mulberry,MD    ____N. Daphine Deutscher, FNP  ____D. Reche Dixon, MD    ____K. Philipp Deputy, MD    ____Other     This letter is to inform you that your recent test(s):  _______Pap Smear    _______Lab Test     _______X-ray    _______ is within acceptable limits  _______ requires a medication change  _______ requires a follow-up lab visit  _______ requires a follow-up visit with your provider   Comments:  Mr. Marrs, we have tried to return your call several times, if we can still be of assistance, please let us know.  Thank you.       _________________________________________________________ If you have any questions, please contact our office                     Sincerely,  Vesta Mixer CMA [facility]

## 2010-07-01 NOTE — Assessment & Plan Note (Signed)
Summary: evaluate knee pain..pain management..cm   Vital Signs:  Patient profile:   60 year old male Weight:      227.3 pounds Temp:     98.7 degrees F Pulse rate:   74 / minute Pulse rhythm:   regular Resp:     14 per minute BP sitting:   118 / 62  (left arm) Cuff size:   large  Vitals Entered By: Michelle Nasuti (March 09, 2010 3:34 PM) CC: R KNEE PAIN Is Patient Diabetic? Yes Pain Assessment Patient in pain? yes      Intensity: 6 CBG Result 124 CBG Device ID NF   Primary Care Provider:  Julieanne Manson MD  CC:  R KNEE PAIN.  History of Present Illness: 1.  Right knee pain:  Mild arthritis on radiologic exam.  Taking 800 mg of Ibuprofen only at night.  No real relief with the pain.  Entire knee hurts.  Feels like something grinding.  No sense of catching in knee.  No swelling or erythema, heat from knee.  If knee bends when sleeping, very painful to straighten back out--also notes if sitting for a while and tries to get up.    Problems Prior to Update: 1)  Cardiac Murmur  (ICD-785.2) 2)  Knee Pain, Right  (ICD-719.46) 3)  Microscopic Hematuria  (ICD-599.72) 4)  Macular Degeneration  (ICD-362.50) 5)  Encounter For Long-term Use of Other Medications  (ICD-V58.69) 6)  Insomnia  (ICD-780.52) 7)  Hypothyroidism  (ICD-244.9) 8)  Small Bowel Obstruction, Hx of  (ICD-V12.79) 9)  Erectile Dysfunction  (ICD-302.72) 10)  Depression  (ICD-311) 11)  Hypertension  (ICD-401.9) 12)  Cholelithiasis  (ICD-574.20) 13)  Diverticular Disease  (ICD-562.10) 14)  Benign Prostatic Hypertrophy, Hx of  (ICD-V13.8) 15)  Dyslipidemia  (ICD-272.4) 16)  Dm, Uncomplicated, Type II  (ICD-250.00) 17)  Congestive Heart Failure  (ICD-428.0)  Medications Prior to Update: 1)  Lisinopril 40 Mg  Tabs (Lisinopril) .Marland Kitchen.. 1 Tab By Mouth Daily 2)  Celexa 40 Mg Tabs (Citalopram Hydrobromide) .Marland Kitchen.. 1 By Mouth Once Daily 3)  Aspirin 325 Mg Tabs (Aspirin) .Marland Kitchen.. 1 By Mouth Once Daily 4)  Ibuprofen 800 Mg  Tabs (Ibuprofen) .... As Needed  Take 1 Tablet By Mouth Every 8 Hours As Needed Joint Pain 5)  Niaspan 500 Mg  Tbcr (Niacin (Antihyperlipidemic)) .... 4 Tabs By Mouth At Bedtime. 6)  Klor-Con M20 20 Meq  Tbcr (Potassium Chloride Crys Cr) .Marland Kitchen.. 1 By Mouth  Once Daily 7)  Proscar 5 Mg  Tabs (Finasteride) .Marland Kitchen.. 1 Tab By Mouth Daily 8)  Carvedilol 25 Mg Tabs (Carvedilol) .... One By Mouth Bid 9)  Triamterene-Hctz 75-50 Mg  Tabs (Triamterene-Hctz) .... 1/2 Tab By Mouth in Am 10)  Glucometer Elite Classic   Kit (Blood Glucose Monitoring Suppl) .... Use For Glucose Testing Once Daily 250.00 11)  Glucometer Elite Test   Strp (Glucose Blood) .... Use For Daily Glucose Testing 250.00 12)  Wellbutrin Sr 150 Mg Xr12h-Tab (Bupropion Hcl) .... Take 1 Tablet By Mouth Every 12 Hours 13)  Lovaza 1 Gm Caps (Omega-3-Acid Ethyl Esters) .... 4 Caps By Mouth Daily 14)  Norvasc 10 Mg Tabs (Amlodipine Besylate) .Marland Kitchen.. 1 Tab By Mouth Daily 15)  Furosemide 40 Mg Tabs (Furosemide) .Marland Kitchen.. 1 By Mouth Once Daily 16)  Vitamin C Cr 1000 Mg Cr-Tabs (Ascorbic Acid) .... 4 By Mouth Daily 17)  Ambien 5 Mg Tabs (Zolpidem Tartrate) .... At Bedtime  Current Medications (verified): 1)  Lisinopril 40 Mg  Tabs (  Lisinopril) .Marland Kitchen.. 1 Tab By Mouth Daily 2)  Celexa 40 Mg Tabs (Citalopram Hydrobromide) .Marland Kitchen.. 1 By Mouth Once Daily 3)  Aspirin 325 Mg Tabs (Aspirin) .Marland Kitchen.. 1 By Mouth Once Daily 4)  Ibuprofen 800 Mg Tabs (Ibuprofen) .... As Needed  Take 1 Tablet By Mouth Every 8 Hours As Needed Joint Pain 5)  Niaspan 500 Mg  Tbcr (Niacin (Antihyperlipidemic)) .... 4 Tabs By Mouth At Bedtime. 6)  Klor-Con M20 20 Meq  Tbcr (Potassium Chloride Crys Cr) .Marland Kitchen.. 1 By Mouth  Once Daily 7)  Proscar 5 Mg  Tabs (Finasteride) .Marland Kitchen.. 1 Tab By Mouth Daily 8)  Carvedilol 25 Mg Tabs (Carvedilol) .... One By Mouth Bid 9)  Triamterene-Hctz 75-50 Mg  Tabs (Triamterene-Hctz) .... 1/2 Tab By Mouth in Am 10)  Glucometer Elite Classic   Kit (Blood Glucose Monitoring Suppl) ....  Use For Glucose Testing Once Daily 250.00 11)  Glucometer Elite Test   Strp (Glucose Blood) .... Use For Daily Glucose Testing 250.00 12)  Wellbutrin Sr 150 Mg Xr12h-Tab (Bupropion Hcl) .... Take 1 Tablet By Mouth Every 12 Hours 13)  Lovaza 1 Gm Caps (Omega-3-Acid Ethyl Esters) .... 4 Caps By Mouth Daily 14)  Norvasc 10 Mg Tabs (Amlodipine Besylate) .Marland Kitchen.. 1 Tab By Mouth Daily 15)  Furosemide 40 Mg Tabs (Furosemide) .Marland Kitchen.. 1 By Mouth Once Daily 16)  Vitamin C Cr 1000 Mg Cr-Tabs (Ascorbic Acid) .... 4 By Mouth Daily 17)  Ambien 5 Mg Tabs (Zolpidem Tartrate) .... At Bedtime  Allergies (verified): No Known Drug Allergies  Physical Exam  General:  NAD Extremities:  Mild edema of both ankles. Full ROM of right knee, though has diffiulties getting to full extension after flexing.  No definite effusion.  Mild tenderness over posterior/medial aspect of joint margin.  No pain or laxity on stressing of collateral or cruciate ligaments.     Impression & Recommendations:  Problem # 1:  KNEE PAIN, RIGHT (ICD-719.46) To take the Hydrocodone only at bedtime. Will refill only every 30 days The following medications were removed from the medication list:    Ibuprofen 800 Mg Tabs (Ibuprofen) .Marland Kitchen... As needed  take 1 tablet by mouth every 8 hours as needed joint pain His updated medication list for this problem includes:    Aspirin 325 Mg Tabs (Aspirin) .Marland Kitchen... 1 by mouth once daily    Hydrocodone-acetaminophen 5-500 Mg Tabs (Hydrocodone-acetaminophen) .Marland Kitchen... 1 tab by mouth at bedtime for right knee pain  Orders: Physical Therapy Referral (PT)  Problem # 2:  Preventive Health Care (ICD-V70.0) Flu vaccine  Complete Medication List: 1)  Lisinopril 40 Mg Tabs (Lisinopril) .Marland Kitchen.. 1 tab by mouth daily 2)  Celexa 40 Mg Tabs (Citalopram hydrobromide) .Marland Kitchen.. 1 by mouth once daily 3)  Aspirin 325 Mg Tabs (Aspirin) .Marland Kitchen.. 1 by mouth once daily 4)  Niaspan 500 Mg Tbcr (Niacin (antihyperlipidemic)) .... 4 tabs by mouth  at bedtime. 5)  Klor-con M20 20 Meq Tbcr (Potassium chloride crys cr) .Marland Kitchen.. 1 by mouth  once daily 6)  Proscar 5 Mg Tabs (Finasteride) .Marland Kitchen.. 1 tab by mouth daily 7)  Carvedilol 25 Mg Tabs (Carvedilol) .... One by mouth bid 8)  Triamterene-hctz 75-50 Mg Tabs (Triamterene-hctz) .... 1/2 tab by mouth in am 9)  Glucometer Elite Classic Kit (Blood glucose monitoring suppl) .... Use for glucose testing once daily 250.00 10)  Glucometer Elite Test Strp (Glucose blood) .... Use for daily glucose testing 250.00 11)  Wellbutrin Sr 150 Mg Xr12h-tab (Bupropion hcl) .... Take 1 tablet  by mouth every 12 hours 12)  Lovaza 1 Gm Caps (Omega-3-acid ethyl esters) .... 4 caps by mouth daily 13)  Norvasc 10 Mg Tabs (Amlodipine besylate) .Marland Kitchen.. 1 tab by mouth daily 14)  Furosemide 40 Mg Tabs (Furosemide) .Marland Kitchen.. 1 by mouth once daily 15)  Vitamin C Cr 1000 Mg Cr-tabs (Ascorbic acid) .... 4 by mouth daily 16)  Ambien 5 Mg Tabs (Zolpidem tartrate) .... At bedtime 17)  Hydrocodone-acetaminophen 5-500 Mg Tabs (Hydrocodone-acetaminophen) .Marland Kitchen.. 1 tab by mouth at bedtime for right knee pain  Other Orders: Influenza Vaccine NON MCR (16109)  Patient Instructions: 1)  Follow up with Dr. Delrae Alfred in 3 months --right knee pain Prescriptions: HYDROCODONE-ACETAMINOPHEN 5-500 MG TABS (HYDROCODONE-ACETAMINOPHEN) 1 tab by mouth at bedtime for right knee pain  #30 x 0   Entered and Authorized by:   Julieanne Manson MD   Signed by:   Julieanne Manson MD on 03/09/2010   Method used:   Print then Give to Patient   RxID:   [id number]    Influenza Vaccine    Vaccine Type: Fluvax Non-MCR    Site: left deltoid    Mfr: [email or web address]    Dose: 0.5 ml    Route: IM    Given by: Michelle Nasuti    Exp. Date: 11/20/2010    Lot #: [id number]    VIS given: 12/22/09 version given March 09, 2010.  Flu Vaccine Consent Questions    Do you have a history of severe allergic reactions to this vaccine? no    Any prior history of  allergic reactions to egg and/or gelatin? no    Do you have a sensitivity to the preservative Thimersol? no    Do you have a past history of Guillan-Barre Syndrome? no    Do you currently have an acute febrile illness? no    Have you ever had a severe reaction to latex? no    Vaccine information given and explained to patient? yes

## 2010-07-01 NOTE — Progress Notes (Signed)
   Phone Note Call from Patient Call back at [facility] Phone [phone] Call back at [phone]   Summary of Call: PT WAKE UP IN THE MIDDLE OF THE NIGHT AND HE HAS SORE THROAT AND HE DOESN'T KNOW IF DR Kippy Melena CAN SEE HIM FOR THAT OR WAIT FOR FEW MORE DAYS.   Also he had pain on his neck for two weeks and he has problem to sleep because of the pain and he is not using anything for this problem. Lundon Rosier MD Initial call taken by: Manon Hilding,  July 06, 2009 11:45 AM  Follow-up for Phone Call        No answer at [phone]. Follow-up by: Vesta Mixer CMA,  July 07, 2009 5:05 PM  Additional Follow-up for Phone Call Additional follow up Details #1::        Still no answer. Additional Follow-up by: Vesta Mixer CMA,  July 10, 2009 5:09 PM    Additional Follow-up for Phone Call Additional follow up Details #2::    Wireless customer unavailable, will also mail letter since 3rd attempt to reach pt. Follow-up by: Vesta Mixer CMA,  July 14, 2009 10:02 AM

## 2010-07-01 NOTE — Progress Notes (Signed)
Summary: CONCERNS ABOUT REFILLS   Phone Note Call from Patient Call back at Home Phone [phone]   Reason for Call: Refill Medication Summary of Call: Patrick [name] PT. Patrick [name] CALLED AND SAYS THAT HE IS VERY CONCERNED, BECAUSE HIS ELIGIBILITY WILL EXPIRE ON MAY 21, AND HE WANTS TO KNOW WHAT DOES HE DO BECAUSE HE WILL NEED REFILLS ON HIS MEDICATIONS AND HIS ELIG. DATE IS NOT UNTIL JULY 12th. I TOLD HIM THAT I THINK HE SHOULD BE ABLE T GET ENOUGH REFILLS UNTIL HIS ELIG APPOINTMENT. Initial call taken by: Leodis Rains,  Oct 13, 2009 2:35 PM  Follow-up for Phone Call        left message on answer machine for pt. to return call Gaylyn Cheers RN  Oct 27, 2009 1:37 PM    Additional Follow-up for Phone Call Additional follow up Details #1::        pt. notified, will be able to get refills until his eligibility appt. Additional Follow-up by: Gaylyn Cheers RN,  Oct 27, 2009 3:27 PM
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
## 2010-07-01 NOTE — Assessment & Plan Note (Signed)
Summary: per check out/szf      Allergies Added: NKDA  Visit Type:  Follow-up Primary Provider:  Julieanne Manson MD  CC:  CHF.  History of Present Illness: The patient presents for followup of his known cardiomyopathy. Since I last saw him he has had no new cardiovascular complaints. These had no new palpitations, presyncope or syncope. He has had no chest pressure, neck or arm discomfort. He has had no shortness of breath, PND or orthopnea. He has no weight gain or edema. He is limited by knee pain and hip pain but is having therapy for this. He does walk frequently with his cane.  Current Medications (verified): 1)  Lisinopril 40 Mg  Tabs (Lisinopril) .Marland Kitchen.. 1 Tab By Mouth Daily 2)  Celexa 40 Mg Tabs (Citalopram Hydrobromide) .Marland Kitchen.. 1 By Mouth Once Daily 3)  Aspirin 325 Mg Tabs (Aspirin) .Marland Kitchen.. 1 By Mouth Once Daily 4)  Niaspan 500 Mg  Tbcr (Niacin (Antihyperlipidemic)) .... 4 Tabs By Mouth At Bedtime. 5)  Klor-Con M20 20 Meq  Tbcr (Potassium Chloride Crys Cr) .Marland Kitchen.. 1 By Mouth  Once Daily 6)  Proscar 5 Mg  Tabs (Finasteride) .Marland Kitchen.. 1 Tab By Mouth Daily 7)  Carvedilol 25 Mg Tabs (Carvedilol) .... One By Mouth Bid 8)  Triamterene-Hctz 75-50 Mg  Tabs (Triamterene-Hctz) .... 1/2 Tab By Mouth in Am 9)  Glucometer Elite Classic   Kit (Blood Glucose Monitoring Suppl) .... Use For Glucose Testing Once Daily 250.00 10)  Glucometer Elite Test   Strp (Glucose Blood) .... Use For Daily Glucose Testing 250.00 11)  Wellbutrin Sr 150 Mg Xr12h-Tab (Bupropion Hcl) .... Take 1 Tablet By Mouth Every 12 Hours 12)  Lovaza 1 Gm Caps (Omega-3-Acid Ethyl Esters) .... 4 Caps By Mouth Daily 13)  Norvasc 10 Mg Tabs (Amlodipine Besylate) .Marland Kitchen.. 1 Tab By Mouth Daily 14)  Furosemide 40 Mg Tabs (Furosemide) .Marland Kitchen.. 1 By Mouth Once Daily 15)  Vitamin C Cr 1000 Mg Cr-Tabs (Ascorbic Acid) .... 4 By Mouth Daily 16)  Lipitor 10 Mg Tabs (Atorvastatin Calcium) .... 1/2 Tab By Mouth Every Other Day. 17)  Trazodone Hcl 50 Mg Tabs  (Trazodone Hcl) .Marland Kitchen.. 1-2 Tabs By Mouth At Bedtime For Sleep  Allergies (verified): No Known Drug Allergies  Past History:  Past Medical History: Reviewed history from 03/26/2009 and no changes required. 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 2/07 Prostate biopsies-Tannenbaum-Benign Umbilical hernia repair.      Review of Systems       As stated in the HPI and negative for all other systems.   Vital Signs:  Patient profile:   60 year old male Height:      68 inches Weight:      226 pounds BMI:     34.49 Pulse rate:   81 / minute Resp:     16 per minute BP sitting:   128 / 86  (right arm)  Vitals Entered By: Marrion Coy, CNA (June 04, 2010 10:28 AM)  Physical Exam  General:  Well developed, well nourished, in no acute distress. Head:  normocephalic and atraumatic Neck:  Neck supple, no JVD. No masses, thyromegaly or abnormal cervical nodes. Chest Wall:  no deformities or breast masses noted Lungs:  Normal respiratory effort, chest expands symmetrically. Lungs are clear to auscultation, no crackles  or wheezes. Abdomen:  Bowel sounds positive; abdomen soft and non-tender without masses, organomegaly, or hernias noted. No hepatosplenomegaly. Msk:  Back normal, normal gait. Muscle strength and tone normal. Extremities:  No clubbing or cyanosis. Neurologic:  Alert and oriented x 3. Cervical Nodes:  no significant adenopathy Psych:  Normal affect.   Detailed Cardiovascular Exam  Neck    Carotids: Carotids full and equal bilaterally without bruits.      Neck Veins: Normal, no JVD.    Heart    Inspection: no deformities or lifts noted.      Palpation:  normal PMI with no thrills palpable.      Auscultation: regular rate and rhythm, S1, S2 without murmurs, rubs, gallops, or clicks.    Vascular    Abdominal Aorta: no palpable masses, pulsations, or audible bruits.      Femoral Pulses: normal femoral pulses bilaterally.      Pedal Pulses: pulses normal in all 4 extremities    Radial Pulses: normal radial pulses bilaterally.      Peripheral Circulation: no clubbing, cyanosis, or edema noted with normal capillary refill.     EKG  Procedure date:  06/04/2010  Findings:      Sinus rhythm with premature ectopic complexes, right bundle branch block  Impression & Recommendations:  Problem # 1:  CONGESTIVE HEART FAILURE (ICD-428.0) He has no new symptoms. No change in therapy is indicated. He will continue with meds as listed. Orders: EKG w/ Interpretation (93000)  Problem # 2:  HYPERTENSION (ICD-401.9) His pressure is excellent and he will continue on meds as listed. Orders: EKG w/ Interpretation (93000)  Patient Instructions: 1)  Your physician recommends that you schedule a follow-up appointment in: 1 year 2)  Your physician recommends that you continue on your current medications as directed. Please refer to the Current Medication list given to you today.

## 2010-07-01 NOTE — Assessment & Plan Note (Signed)
Summary: rov per pt call/lg  Medications Added VITAMIN C CR 1000 MG CR-TABS (ASCORBIC ACID) 4 by mouth daily AMBIEN 5 MG TABS (ZOLPIDEM TARTRATE) at bedtime      Allergies Added: NKDA  Visit Type:  Follow-up Primary Provider:  Julieanne Manson MD  CC:  cardiomyopathy.  History of Present Illness: The patient presents for followup of the above. Since I last saw him he has had no new cardiovascular complaints. He's been limited by joint pain particularly in his right knee. He denies any chest pressure, neck or arm discomfort. He denies any palpitations, presyncope or syncope. He has had no new shortness of breath, PND or orthopnea. Of note his cholesterol demonstrated an LDL recently of 7. He was taken off of Lipitor.  Current Medications (verified): 1)  Lisinopril 40 Mg  Tabs (Lisinopril) .Marland Kitchen.. 1 Tab By Mouth Daily 2)  Celexa 40 Mg Tabs (Citalopram Hydrobromide) .Marland Kitchen.. 1 By Mouth Once Daily 3)  Aspirin 325 Mg Tabs (Aspirin) .Marland Kitchen.. 1 By Mouth Once Daily 4)  Ibuprofen 800 Mg Tabs (Ibuprofen) .... As Needed  Take 1 Tablet By Mouth Every 8 Hours As Needed Joint Pain 5)  Niaspan 500 Mg  Tbcr (Niacin (Antihyperlipidemic)) .... 4 Tabs By Mouth At Bedtime. 6)  Klor-Con M20 20 Meq  Tbcr (Potassium Chloride Crys Cr) .Marland Kitchen.. 1 By Mouth  Once Daily 7)  Proscar 5 Mg  Tabs (Finasteride) .Marland Kitchen.. 1 Tab By Mouth Daily 8)  Carvedilol 25 Mg Tabs (Carvedilol) .... One By Mouth Bid 9)  Triamterene-Hctz 75-50 Mg  Tabs (Triamterene-Hctz) .... 1/2 Tab By Mouth in Am 10)  Glucometer Elite Classic   Kit (Blood Glucose Monitoring Suppl) .... Use For Glucose Testing Once Daily 250.00 11)  Glucometer Elite Test   Strp (Glucose Blood) .... Use For Daily Glucose Testing 250.00 12)  Wellbutrin Sr 150 Mg Xr12h-Tab (Bupropion Hcl) .... Take 1 Tablet By Mouth Every 12 Hours 13)  Lovaza 1 Gm Caps (Omega-3-Acid Ethyl Esters) .... 4 Caps By Mouth Daily 14)  Norvasc 10 Mg Tabs (Amlodipine Besylate) .Marland Kitchen.. 1 Tab By Mouth Daily 15)   Furosemide 40 Mg Tabs (Furosemide) .Marland Kitchen.. 1 By Mouth Once Daily 16)  Vitamin C Cr 1000 Mg Cr-Tabs (Ascorbic Acid) .... 4 By Mouth Daily 17)  Ambien 5 Mg Tabs (Zolpidem Tartrate) .... At Bedtime  Allergies (verified): No Known Drug Allergies  Past History:  Past Medical History: Reviewed history from 03/26/2009 and no changes required. 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 2/07 Prostate biopsies-Tannenbaum-Benign Umbilical hernia repair.      Review of Systems       Positive for insomnia. Otherwise as stated in the history of present illness negative for all other systems.  Vital Signs:  Patient profile:   60 year old male Height:      68 inches Weight:      225 pounds BMI:     34.33 Pulse rate:   63 / minute Resp:     18 per minute BP sitting:   138 / 86  (right arm)  Vitals Entered By: Marrion Coy, CNA (February 12, 2010 4:21 PM)  Physical Exam  General:  Well developed, well nourished, in no acute distress. Head:  normocephalic and atraumatic Mouth:  Very poor dentition.  Oral mucosa normal. Neck:  Neck supple,  no JVD. No masses, thyromegaly or abnormal cervical nodes. Chest Wall:  no deformities or breast masses noted Lungs:  Clear bilaterally to auscultation and percussion. Heart:  Non-displaced PMI, chest non-tender; regular rate and rhythm, S1, S2 without murmurs, rubs or gallops. Carotid upstroke normal, no bruit. Normal abdominal aortic size, no bruits. Femorals normal pulses, no bruits. Pedals normal pulses. No edema, no varicosities. Abdomen:  Bowel sounds positive; abdomen soft and non-tender without masses, organomegaly, or hernias noted. No  hepatosplenomegaly. Msk:  Back normal, normal gait. Muscle strength and tone normal. Pulses:  pulses normal in all 4 extremities Extremities:  Mild bilateral edema Neurologic:  Alert and oriented x 3. Skin:  Intact without lesions or rashes. Cervical Nodes:  no significant adenopathy Psych:  Normal affect.   EKG  Procedure date:  02/12/2010  Findings:      Sinus rhythm, rate 69, right bundle branch block, premature ventricular contraction, no acute ST-T wave changes  Impression & Recommendations:  Problem # 1:  CONGESTIVE HEART FAILURE (ICD-428.0) At this point I think the patient has no new symptoms or suggestion of worsening cardiomyopathy. I would make no change to his medical regimen. No further imaging is indicated. Of note he had his last stress test in 2010 with no evidence of ischemia or infarct. His last echo was December 2009 EF 45%. Orders: EKG w/ Interpretation (93000)  Problem # 2:  INSOMNIA (ICD-780.52) I did give him a very limited supply of Ambien but suggested that if he was to have this renewed he needed to come from his primary physician.  Problem # 3:  HYPERTENSION (ICD-401.9) His blood pressure is controlled. He will continue the medicines as listed.  Patient Instructions: 1)  Your physician recommends that you schedule a follow-up appointment in: 12 months with Dr Antoine Poche 2)  Your physician recommends that you continue on your current medications as directed. Please refer to the Current Medication list given to you today. Prescriptions: AMBIEN 5 MG TABS (ZOLPIDEM TARTRATE) at bedtime  #10 x 0   Entered by:   Charolotte Capuchin, RN   Authorized by:   Rollene Rotunda, MD, [facility]   Signed by:   Charolotte Capuchin, RN on 02/12/2010   Method used:   Print then Give to Patient   RxID:   [id number]  I have reviewed and approved all prescriptions at the time of this visit. Rollene Rotunda, MD, [facility]  February 12, 2010 6:14 PM

## 2010-07-01 NOTE — Letter (Signed)
Summary: Lipid Letter  [facility]  [address] [place], [place] [address]   Phone: [phone]  Fax: [phone]    06/16/2010  Patrick [name] [address] [place], [place]  [address]  Dear Patrick [name]:  We have carefully reviewed your last lipid profile from 06/11/2010 and the results are noted below with a summary of recommendations for lipid management.    Cholesterol:       127     Goal: <200   HDL "good" Cholesterol:   42     Goal: >45   LDL "bad" Cholesterol:   58     Goal: <70   Triglycerides:       135     Goal: <150    Cholesterol panel is fine--continue current meds    TLC Diet (Therapeutic Lifestyle Change): Saturated Fats & Transfatty acids should be kept < 7% of total calories ***Reduce Saturated Fats Polyunstaurated Fat can be up to 10% of total calories Monounsaturated Fat Fat can be up to 20% of total calories Total Fat should be no greater than 25-35% of total calories Carbohydrates should be 50-60% of total calories Protein should be approximately 15% of total calories Fiber should be at least 20-30 grams a day ***Increased fiber may help lower LDL Total Cholesterol should be < 200mg /day Consider adding plant stanol/sterols to diet (example: Benacol spread) ***A higher intake of unsaturated fat may reduce Triglycerides and Increase HDL    Adjunctive Measures (may lower LIPIDS and reduce risk of Heart Attack) include: Aerobic Exercise (20-30 minutes 3-4 times a week) Limit Alcohol Consumption Weight Reduction Aspirin 75-81 mg a day by mouth (if not allergic or contraindicated) Dietary Fiber 20-30 grams a day by mouth     Current Medications: 1)    Lisinopril 40 Mg  Tabs (Lisinopril) .Marland Kitchen.. 1 tab by mouth daily 2)    Celexa 40 Mg Tabs (Citalopram hydrobromide) .Marland Kitchen.. 1 by mouth once daily 3)    Aspirin 325 Mg Tabs (Aspirin) .Marland Kitchen.. 1 by mouth once daily 4)    Niaspan 500 Mg  Tbcr (Niacin (antihyperlipidemic)) .... 4 tabs by mouth  at bedtime. 5)    Klor-con M20 20 Meq  Tbcr (Potassium chloride crys cr) .Marland Kitchen.. 1 by mouth  once daily 6)    Proscar 5 Mg  Tabs (Finasteride) .Marland Kitchen.. 1 tab by mouth daily 7)    Carvedilol 25 Mg Tabs (Carvedilol) .... One by mouth bid 8)    Triamterene-hctz 75-50 Mg  Tabs (Triamterene-hctz) .... 1/2 tab by mouth in am 9)    Glucometer Elite Classic   Kit (Blood glucose monitoring suppl) .... Use for glucose testing once daily 250.00 10)    Glucometer Elite Test   Strp (Glucose blood) .... Use for daily glucose testing 250.00 11)    Wellbutrin Sr 150 Mg Xr12h-tab (Bupropion hcl) .... Take 1 tablet by mouth every 12 hours 12)    Lovaza 1 Gm Caps (Omega-3-acid ethyl esters) .... 4 caps by mouth daily 13)    Norvasc 10 Mg Tabs (Amlodipine besylate) .Marland Kitchen.. 1 tab by mouth daily 14)    Furosemide 40 Mg Tabs (Furosemide) .Marland Kitchen.. 1 by mouth once daily 15)    Vitamin C Cr 1000 Mg Cr-tabs (Ascorbic acid) .... 4 by mouth daily 16)    Lipitor 10 Mg Tabs (Atorvastatin calcium) .... 1/2 tab by mouth every other day. 17)    Trazodone Hcl 50 Mg Tabs (Trazodone hcl) .Marland Kitchen.. 1-2 tabs by mouth at  bedtime for sleep 18)    Ibuprofen 800 Mg Tabs (Ibuprofen) .Marland Kitchen.. 1 tab by mouth three times a day with meals  If you have any questions, please call. We appreciate being able to work with you.   Sincerely,    [facility] Julieanne Manson MD

## 2010-07-01 NOTE — Progress Notes (Signed)
Summary: NEED CHANGE MEDS  Phone Note Call from Patient Call back at [phone]   Reason for Call: Talk to Nurse Summary of Call: PATIENT IS CALLING TO SEE IF YOU CAN CHANGE MEDICIENE FOR TRACADONE OR SOMETHING LIKE IT. [facility] [address]Marland Kitchen  FIRST IF YOU CALL SOMETHING IN TO TRY [facility], AND IF NOT THEN TO [facility]. Initial call taken by: Domenic Polite,  April 08, 2010 2:15 PM  Follow-up for Phone Call        Left message on voicemail for pt. to return call.  Dutch Quint RN  April 08, 2010 2:29 PM  States hydrocodone 5-500 mg. is not working -- still having a lot of pain in his leg, unable to sleep a full night. Follow-up by: Dutch Quint RN,  April 12, 2010 10:07 AM  Additional Follow-up for Phone Call Additional follow up Details #1::        He needs to make an appt. to discuss and injection of the knee--1/2 hour in case we decide to inject Additional Follow-up by: Julieanne Manson MD,  April 14, 2010 2:26 PM    Additional Follow-up for Phone Call Additional follow up Details #2::    Left message on voicemail for pt. to return call.  Dutch Quint RN  April 14, 2010 3:05 PM  Advised of provider's recommendation -- appt. schedled 04/20/10.  Dutch Quint RN  April 14, 2010 3:30 PM

## 2010-07-13 ENCOUNTER — Telehealth (INDEPENDENT_AMBULATORY_CARE_PROVIDER_SITE_OTHER): Payer: Self-pay | Admitting: Internal Medicine

## 2010-07-21 NOTE — Miscellaneous (Signed)
Summary: Rehab Report//DISCHARGE SUMMARY  Rehab Report//DISCHARGE SUMMARY   Imported By: Arta Bruce 07/15/2010 13:54:03  _____________________________________________________________________  External Attachment:    Type:   Image     Comment:   External Document

## 2010-07-22 ENCOUNTER — Encounter (INDEPENDENT_AMBULATORY_CARE_PROVIDER_SITE_OTHER): Payer: Self-pay | Admitting: Internal Medicine

## 2010-07-27 NOTE — Letter (Signed)
Summary: CALL A NURSE  CALL A NURSE   Imported By: Arta Bruce 07/23/2010 11:56:11  _____________________________________________________________________  External Attachment:    Type:   Image     Comment:   External Document

## 2010-07-28 ENCOUNTER — Encounter (INDEPENDENT_AMBULATORY_CARE_PROVIDER_SITE_OTHER): Payer: Self-pay | Admitting: Internal Medicine

## 2010-07-29 ENCOUNTER — Telehealth (INDEPENDENT_AMBULATORY_CARE_PROVIDER_SITE_OTHER): Payer: Self-pay | Admitting: Internal Medicine

## 2010-08-05 ENCOUNTER — Telehealth (INDEPENDENT_AMBULATORY_CARE_PROVIDER_SITE_OTHER): Payer: Self-pay | Admitting: Internal Medicine

## 2010-08-05 NOTE — Progress Notes (Signed)
Summary: TRASADONE REFILL  Phone Note Call from Patient Call back at [phone]   Caller: Patient Summary of Call: DR Aqua Denslow PT NEED A HIGHER DOSE TRAZADON [facility] . Fuller Song HIM AT [phone] [facility]  Initial call taken by: Cheryll Dessert,  July 29, 2010 12:57 PM  Follow-up for Phone Call        Left message on voicemail for pt. to return call.  Dutch Quint RN  July 29, 2010 1:05 PM  Spoke with pt. and gave instructions from provider re phone call of 07/13/10 - to try 3 tablets for one week and call us back to let us know if effective.  If so, will change Rx.  Pt. verbalized understanding and agreement.  Dutch Quint RN  July 29, 2010 3:09 PM

## 2010-08-05 NOTE — Letter (Signed)
Summary: Generic Letter  [facility]  [address] [place], [place] [address]   Phone: [phone]  Fax: [phone]        07/28/2010  Lathyn Bouwman [address] [place], [place]  [address]  Dear Mr. SERMENO,  We have been unable to contact you by telephone.  Please call our office, at your earliest convenience, so that we may speak with you regarding your recent phone call to this office.  Sincerely,   Dutch Quint RN

## 2010-08-05 NOTE — Progress Notes (Signed)
Summary: Trazodone not working anymore  Phone Note Call from Patient Call back at cell [phone]   Summary of Call: pt says his trazadone doesn't work like it use to.... pt says he toss and turn again when he is asleep.... pt would like to know if you can up the dose of the med... [facility] Initial call taken by: Armenia Shannon,  July 13, 2010 2:53 PM  Follow-up for Phone Call        Phone is busy.  Dutch Quint RN  July 13, 2010 3:10 PM  States hasn't been working for the past 3-4 nights.  No changes in his life or diet, mind won't rest.  Is tossing and turning trying to get to sleep.  Is waking up tired.   Dutch Quint RN  July 13, 2010 4:07 PM   Additional Follow-up for Phone Call Additional follow up Details #1::        Can try 3 tabs at bedtime--to call in one week and let us know how that works, as will need to change his prescription. Additional Follow-up by: Julieanne Manson MD,  July 21, 2010 12:11 PM    Additional Follow-up for Phone Call Additional follow up Details #2::    Left message on answering machine for pt. to return call.  Dutch Quint RN  July 22, 2010 9:39 AM  Received message from Call A Nurse with pt.'s cell number.  Left message on voicemail for pt. to return call.  Dutch Quint RN  July 23, 2010 11:51 AM  Left message on voicemail for pt. to return call.  Dutch Quint RN  July 27, 2010 2:41 PM  Left message on voicemail for pt. to return call.  Letter sent.  Dutch Quint RN  July 28, 2010 11:18 AM

## 2010-08-17 NOTE — Progress Notes (Signed)
Summary: Change of trazodone dosage  Phone Note Call from Patient   Summary of Call: Pt. states that the increase of Trazadone to 3 tabs per night is working great.  Needs change of Rx.   Initial call taken by: Dutch Quint RN,  August 05, 2010 12:41 PM    New/Updated Medications: TRAZODONE HCL 50 MG TABS (TRAZODONE HCL) 3 tabs by mouth at bedtime for sleep Prescriptions: TRAZODONE HCL 50 MG TABS (TRAZODONE HCL) 3 tabs by mouth at bedtime for sleep  #90 x 11   Entered and Authorized by:   Julieanne Manson MD   Signed by:   Julieanne Manson MD on 08/09/2010   Method used:   Faxed to ...       [facility] (retail)       [address] [place], [place]  [address]       Ph: [phone]       Fax: [phone]   RxID:   [id number]

## 2010-09-04 LAB — POCT I-STAT, CHEM 8
Chloride: 105 mEq/L (ref 96–112)
Creatinine, Ser: 1.2 mg/dL (ref 0.4–1.5)
Hemoglobin: 13.6 g/dL (ref 13.0–17.0)
Potassium: 3.2 mEq/L — ABNORMAL LOW (ref 3.5–5.1)
Sodium: 141 mEq/L (ref 135–145)

## 2010-09-04 LAB — POCT I-STAT 3, VENOUS BLOOD GAS (G3P V)
Acid-Base Excess: 2 mmol/L (ref 0.0–2.0)
Bicarbonate: 25.9 mEq/L — ABNORMAL HIGH (ref 20.0–24.0)
pCO2, Ven: 36.2 mmHg — ABNORMAL LOW (ref 45.0–50.0)
pH, Ven: 7.463 — ABNORMAL HIGH (ref 7.250–7.300)
pO2, Ven: 55 mmHg — ABNORMAL HIGH (ref 30.0–45.0)

## 2010-09-04 LAB — POCT CARDIAC MARKERS
CKMB, poc: <1 ng/mL — ABNORMAL LOW (ref 1.0–8.0)
Myoglobin, poc: 88.5 ng/mL (ref 12–200)
Troponin i, poc: <0.05 ng/mL (ref 0.00–0.09)

## 2010-09-04 LAB — CBC
Platelets: 176 10*3/uL (ref 150–400)
RDW: 14.2 % (ref 11.5–15.5)
WBC: 12.6 10*3/uL — ABNORMAL HIGH (ref 4.0–10.5)

## 2010-09-04 LAB — BRAIN NATRIURETIC PEPTIDE: Pro B Natriuretic peptide (BNP): 719 pg/mL — ABNORMAL HIGH (ref 0.0–100.0)

## 2010-09-04 LAB — DIFFERENTIAL
Basophils Absolute: 0 10*3/uL (ref 0.0–0.1)
Eosinophils Relative: 1 % (ref 0–5)
Lymphocytes Relative: 9 % — ABNORMAL LOW (ref 12–46)
Lymphs Abs: 1.2 10*3/uL (ref 0.7–4.0)
Neutro Abs: 10 10*3/uL — ABNORMAL HIGH (ref 1.7–7.7)
Neutrophils Relative %: 79 % — ABNORMAL HIGH (ref 43–77)

## 2010-09-05 LAB — DIFFERENTIAL
Basophils Absolute: 0 10*3/uL (ref 0.0–0.1)
Basophils Relative: 0 % (ref 0–1)
Neutro Abs: 11.2 10*3/uL — ABNORMAL HIGH (ref 1.7–7.7)
Neutrophils Relative %: 81 % — ABNORMAL HIGH (ref 43–77)

## 2010-09-05 LAB — BASIC METABOLIC PANEL
CO2: 27 mEq/L (ref 19–32)
Calcium: 9.3 mg/dL (ref 8.4–10.5)
Creatinine, Ser: 1.1 mg/dL (ref 0.4–1.5)
GFR calc Af Amer: >60 mL/min (ref >60)
GFR calc non Af Amer: >60 mL/min (ref >60)
Glucose, Bld: 146 mg/dL — ABNORMAL HIGH (ref 70–99)

## 2010-09-05 LAB — URINE MICROSCOPIC-ADD ON

## 2010-09-05 LAB — URINALYSIS, ROUTINE W REFLEX MICROSCOPIC
Leukocytes, UA: NEGATIVE
Nitrite: NEGATIVE
Specific Gravity, Urine: 1.013 (ref 1.005–1.030)
Urobilinogen, UA: 0.2 mg/dL (ref 0.0–1.0)

## 2010-09-05 LAB — CBC
MCHC: 34.3 g/dL (ref 30.0–36.0)
RBC: 4.77 MIL/uL (ref 4.22–5.81)
RDW: 13.5 % (ref 11.5–15.5)

## 2010-10-12 NOTE — Discharge Summary (Signed)
NAMESERVANDO, Patrick [name]                    ACCOUNT NO.:  [id number]   MEDICAL RECORD NO.:  [id number]          PATIENT TYPE:  INP   LOCATION:  4735                         FACILITY:  [facility]   PHYSICIAN:  Alvester Morin, M.D.  DATE OF BIRTH:  11-01-1950   DATE OF ADMISSION:  02/02/2008  DATE OF DISCHARGE:  02/04/2008                               DISCHARGE SUMMARY   DISCHARGE DIAGNOSES:  1. Congestive heart failure exacerbation.  2. Dizziness.  3. Hypokalemia.  4. Hypertension.  5. Type 2 diabetes.  6. Depression.  7. Hyperlipidemia.   DISCHARGE MEDICATIONS:  1. Aspirin 325 mg p.o. daily.  2. Amlodipine 5 mg p.o. daily.  3. Maxzide 25/37.5 mg p.o. daily.  4. Lisinopril 40 mg p.o. daily.  5. Finasteride 5 mg p.o. daily.  6. Niaspan 1500 mg p.o. nightly.  7. Lopid 600 mg p.o. b.i.d.  8. Klor-Con 20 mEq p.o. daily.  9. Celexa 40 mg p.o. daily.  10.Bupropion 150 mg p.o. daily.  11.Atenolol 50 mg p.o. b.i.d.   DISPOSITION AND FOLLOWUP:  The patient was discharged to home and was  instructed to make appointment with PCP, Dr. Delrae Alfred at [facility]  within 10 days.  At that time, the patient should have a basic metabolic  panel checked primarily to evaluate his potassium status as the patient  was hypokalemic in the hospital in the context of Lasix administration.   PROCEDURES PERFORMED:  None.   CONSULTATIONS:  None.   ADMITTING HISTORY AND PHYSICAL:  The patient is a 60 year old male with  a history of congestive heart failure and diabetes as well as  hypertension and hyperlipidemia who presented with complaints of  shortness of breath as well as nausea and dizziness.  The patient  reported shortness of breath starting around noon 1 day prior to  admission.  The patient after going to bed that evening awoke suddenly  feeling nauseated and dizzy.  The dizziness was described as feeling  like the room was spinning.  Upon standing both dizziness and nausea  worsened.  The patient  describes an associated pressured feeling in his  head at the time of dizziness; however, denies any associated chest pain  or any other pain whatsoever.  The patient called EMS.  Blood pressure  was noted to be 180/120 by EMS.  The patient's only other associated  symptoms during this episode was diaphoresis.  The patient has had  similar exacerbation of CHF in the past but without nausea, vomiting, or  dizziness.  The patient reported couple of episodes of palpitations  during episode.  There was no exertional trigger.  The patient did  ultimately report a possible increase in salt intake over the previous 4  days.   PAST MEDICAL HISTORY:  1. Hypertension, diagnosed 25 years ago.  2. Congestive heart failure, diagnosed in 2003.  3. Type 2 diabetes, diagnosed in 2001.  4. Depression, 2008.  5. Dyslipidemia.  6. Benign prostatic hypertrophy.   PAST SURGICAL HISTORY:  Hernia repair in 1980s.   HOSPITALIZATIONS:  A hospitalization last year in 2008 for possible  small bowel obstruction.   PHYSICAL EXAMINATION:  VITAL SIGNS:  At admission, temperature 98.4,  blood pressure 158/98, respiratory rate 18, and saturating 98% on 2 L.  GENERAL:  A well-appearing obese male in no acute distress.  EYES:  Pupils equal, round, and reactive to light.  Extraocular  movements intact.  Sclerae is anicteric.  RESPIRATORY:  Clear to auscultation with the exception of mild crackles  in the bilateral lung bases.  No wheezing.  CARDIOVASCULAR:  No jugular venous distention.  Regular rate and rhythm.  Normal S1 and S2.  Positive S3.  No murmurs or rubs.  ABDOMINAL:  Soft, nontender, and nondistended.  Normoactive bowel  sounds.  EXTREMITIES:  Trace pitting edema in the bilateral lower extremities.  NEUROLOGIC:  The patient was alert and oriented x4.  No focal deficits.  Sensation was full to light touch throughout.  Cranial nerves II through  XII were intact.  Finger-to-nose examination was normal.   Romberg was  normal and rapid alternating movements were normal.   LABORATORY DATA:  The patient's C-MET includes sodium 143, potassium  3.9, chloride 105, bicarb 30, BUN 16, creatinine 1.14, glucose 139 with  calcium 9.1.  AST 18, ALT 20, total protein 6.8, and albumin 3.6.  CBC,  white blood cell 8.0, H and H 14 and 41.7 with MCV of 89.5, and  platelets 195.  ANC 6.3.  BNP 873.  Cardiac enzymes, CK-MB 1.6 followed  by 1.8, troponin I less than 0.05 followed by less than 0.05, and  myoglobin 67.8 followed by 71.6.   Additional studies include a chest x-ray showing interstitial pulmonary  edema developing in the interval from previous comparison study as well  as stable cardiomegaly.   HOSPITAL COURSE:  1. Congestive heart failure exacerbation:  The patient's symptoms of      dizziness and nausea had resolved by the time I saw the patient in      the ED.  The patient had received some Zofran.  Prior to that, the      patient did still complain of some moderate shortness of breath.      The patient's nausea and dizziness did Patrick recur throughout his      inpatient stay.  The patient did remain slightly short of breath      throughout his hospital day #1.  By hospital day #2, the patient      reported improvement in shortness of breath.  Physical exam had      also improved by hospital day #2 after the patient received 40 mg      of Lasix IV.  By hospital day #2, his crackles in his bilateral      lung bases had resolved.  The patient's urine output was      appropriate for his dose of Lasix and he was net negative 2 liters.      The patient's initial complaints were also worked up for possible      ACS.  This was ruled out with multiple unchanged EKGs.  His EKGs      did show a baseline abnormality of right bundle-branch block;      however, did Patrick show signs of ischemia.  His cardiac enzymes were      also cycled and remained within normal limits and unchanged.  The      patient's  presenting symptoms were also concerning for possible      cerebellar CVA given his acute onset of dizziness, nausea,  and      shortness of breath; however, this was Patrick worked up further      because the patient's neuro exam was nonfocal and his symptoms had      resolved.  CHF exacerbation was the final diagnosis.  The patient's      beta-blocker was held while he was an inpatient.  The patient was      markedly improved in his symptoms and physical exam by the day of      discharge.  2. Dizziness:  The patient also had reported a worsening of symptoms      while standing initially and was orthostatic.  As discussed above,      CVA or TIA was initially considered for cause of the patient's      symptoms.  However, these were Patrick worked up further.  After      receiving dose of Lasix and having an increased and negative I's      and O's, the patient's orthostatics were checked again on hospital      day #2, and the patient had no symptoms and also was shown Patrick to      be orthostatic.  Also, considered arrhythmia as a cause of the      patient's dizziness, so the patient was monitored on telemetry      throughout his stay.  The evening of hospital day #1, the patient      was shown to have 3 beats of tachycardia.  However, these were      initially reported as wide complex; however, on further      examination, it was difficult to determine due to the limits on the      gain on telemetry.  However, it was felt that these were likely Patrick      wide complex.  However, given the patient's multiple risk factors      for VTach given his cardiomyopathy, it was felt that maintaining      the patient's potassium and magnesium at 4 and 2 respectively would      be in his best interest.  The patient did Patrick experience any more      symptoms of dizziness after his initial stay in the ED.  3. Hypokalemia:  In the context of receiving a 40 mg dose of Lasix on      hospital day #1, the patient's  potassium on hospital day #2 was      shown to be slightly low at 3.4.  The patient was already receiving      his baseline 20 mEq daily of potassium.  An additional 40 mEq of      potassium were given p.o. to try to correct this.  4. Hypertension:  The patient remained on his home meds throughout his      stay with the exception of atenolol, which was held given the      context of his exacerbation of CHF.  The patient's blood pressures      remained around the 130s to 150s over 80s to low 100s throughout      his stay.  At discharge, the patient was told to restart his      atenolol dose.  5. Type 2 diabetes:  The patient's last A1c was well within normal      limits.  His A1c was rechecked and [name] to be well within normal      limits at 5.6%.  The  patient was Patrick on home pharmacologic therapy      for diabetes and was Patrick started on anything here.  He remained      asymptomatic regarding his diabetes.  6. Depression:  The patient's depression was stable.  He was continued      on his home meds.  7. Dyslipidemia:  The patient had a fasting lipid panel checked while      here and was all [name] to be within normal limits with the      exception of his HDL which was low at 18.  The patient was      continued on his home regimen of gemfibrozil and niacin.   DISCHARGE VITALS:  VITAL SIGNS:  T-max 98.1, systolic blood pressures  140s to 150s over diastolics 70s to 90s, pulse rate of 85, respiratory  rate of 20, and saturating 93% on room air.   The patient's I and O's were negative 2 L.   DISCHARGE LABORATORY DATA:  Chem-7, sodium 141, potassium 3.3, chloride  98, bicarb 33, BUN 17, creatinine 1.27, and glucose 135 with calcium of  9.0.   Dictation by Brooks Sailors, MS IV for Joaquin Courts, MD      Joaquin Courts, MD  Electronically Signed      Alvester Morin, M.D.  Electronically Signed    VW/MEDQ  D:  02/05/2008  T:  02/05/2008  Job:  [id number]   cc:   Marcene Duos, M.D.

## 2010-10-12 NOTE — H&P (Signed)
NAME:  Patrick [name], Patrick [name]                    ACCOUNT NO.:  [id number]   MEDICAL RECORD NO.:  [id number]          PATIENT TYPE:  INP   LOCATION:  3743                         FACILITY:  [facility]   PHYSICIAN:  Hind I Elsaid, MD      DATE OF BIRTH:  1951-04-01   DATE OF ADMISSION:  08/21/2007  DATE OF DISCHARGE:                              HISTORY & PHYSICAL   CHIEF COMPLAINT:  Abdominal pain for one day.   HISTORY OF PRESENT ILLNESS:  This is a 60 year old man with history of  congestive heart failure, ejection fraction 40-50%, diabetes mellitus,  hypertension. The patient was in his usual health until yesterday  afternoon when he started to develop sudden onset of abdominal pain,  mainly periumbilical. Pain was so severe, gradually built up, colicky in  nature, severity 10/10, associated with nausea, but patient denies any  vomiting. The patient admitted he had bowel movement yesterday. Also,  patient noticed some abdominal distention with bloating. The pain was so  severe the patient had to call the ambulance because he was scared to  drive his car. The patient denies any diarrhea. Denies any chest pain,  denies any shortness of breath or palpitations. When he came to the  emergency room, the patient had an x-ray which showed small bowel with  fluid level suggesting obstruction. CT abdomen and pelvis was done which  confirmed the presence of a small-bowel obstruction with possible  adhesion related. The patient was seen and examined. At that time, the  patient denies, feeling much better, being improved.  At this time, the  patient admitted pain around 1 to 2 to 3 and denies any vomiting. The  patient admitted nausea. Abdominal bloating is decreased in size, but  the patient denies any passing gas or flatus at this time and denies any  vomiting.   PAST MEDICAL HISTORY:  1. Congestive heart failure.  2. Diabetes mellitus.  3. Hypertension.  4. Benign prostatic hypertrophy.  5.  Hypercholesterolemia.  6. History of gouty arthritis.   MEDICATIONS:  1. Potassium chloride 20 mEq p.o. daily.  2. Atenolol 50 mg p.o. b.i.d.  3. Lisinopril 40 mg p.o. daily.  4. Niaspan 1500 mg at bedtime.  5. Triamterene/hydrochlorothiazide half tablet daily.  6. Proscar 5 mg p.o. daily.  7. Aspirin 325 mg p.o. daily.  8. _neurontin 600 mg p.o. b.i.d.  9. Celexa 40 mg p.o. daily.  10.Wellbutrin 150 mg every day.  11.Norvasc 2.5 mg daily.   ALLERGIES:  No known drug allergies.   PAST SURGICAL HISTORY:  History of umbilical hernia repair.   FAMILY HISTORY:  Mother died with hypertension, diabetes, and stroke  complications. Father died with Lewy-Body syndrome at age 19.   SYSTEMIC REVIEW:  The patient denies any headache. Denies any numbness  or weakness of his extremities. Denies anesthesia. Positive for nausea  and bloating. Positive for abdominal distention but decreased in size.  Abdominal pain is gone at this time. Denies any hematuria. Denies skin  rashes.   SOCIAL HISTORY:  The patient is single, lives by himself. He  smokes  cigars on and off, but he is not a heavy smoker. Denies any alcohol.  Denies any IV drug abuse. He is on unemployment at this time.   PHYSICAL EXAMINATION:  VITAL SIGNS:  Temperature 96.9, blood pressure  152/89, pulse rate 81, respiratory rate 22, saturating 97% on room air.  Patient lying flat on bed, no respiratory distress or shortness of  breath. The patient denies any pain at this time.  HEENT:  Pupils are equal, round, and reactive to light and  accommodation. Extraocular muscle movements normal.  NECK EXAMINATION:  Supple. No JVD. No lymphadenopathy. No masses.  LUNG EXAMINATION:  Diminished air entry in the left upper lung.  Otherwise, no evidence of wheezing or crackles.  ABDOMINAL EXAM:  Distended. Soft. Nontender. Bowel sounds positive.  RECTAL EXAMINATION:  Not done.  LOWER EXTREMITIES:  There is no edema. Peripheral pulses  intact.  CENTRAL NERVOUS SYSTEM:  Patient alert and oriented x3 with no focal  neurological findings.   LABORATORY DATA:  Lipase 21. Sodium 137, potassium 4, chloride 100,  bicarbonate 28, glucose 158, BUN 18, creatinine 1.46. Bilirubin 1,  alkaline phosphatase 67, AST 10, ALT 16, total protein 7.2, albumin 4,  and calcium 9. White blood cells 16.4, hemoglobin 17.1, hematocrit 49.1,  platelets 183. CT scan of his abdomen and pelvis showed small-bowel  obstruction with two apparent transition points in the distal ileum,  possible adhesions, the most proximal area, may contain a focal area of  the small bowel wall thickening, consider followup. A small amount of  ascites. Cholelithiasis. Mild splenomegaly and probable left adenoma. CT  pelvis:  Prostate enlargement, chronic diverticulosis without evidence  of diverticulitis.  An abdominal x-ray:  Dilated small bowel with fluid  levels suggesting obstruction. Cholelithiasis.   ASSESSMENT AND PLAN:  1. Abdominal pain, most probably small-bowel obstruction, secondary to      adhesions. The patient will be kept n.p.o., start gentle IV fluid      hydration secondary to the history of congestive heart failure.      Will hold off  NG tube at this time as the patient's pain is      resolved, and there is no evidence of vomiting. Dr. Abbey Chatters      informed about the patient, and he will see the patient in a.m. At      this time, I do not think the patient needs to go immediately to      the operating room. Will ask Dr. Abbey Chatters for consultation and      followup.  2. Acute renal failure. Continue with IV fluids. Will change blood      pressure medication to IV and topical, will hold off on any p.o.      medication.  3. Leukocytosis. Cause is unclear. Most probably secondary to the      possibility of underlying infection. Will get chest x-ray,      portable. Will hold off on any antibiotics at this time.  4. Deep vein thrombosis and  gastrointestinal prophylaxis.      Hind Bosie Helper, MD  Electronically Signed     HIE/MEDQ  D:  08/22/2007  T:  08/22/2007  Job:  [id number]

## 2010-10-12 NOTE — Assessment & Plan Note (Signed)
HEALTHCARE                            CARDIOLOGY OFFICE NOTE   NAME:Patrick [name]                           MRN:          [id number]  DATE:05/26/2008                            DOB:          31-Aug-1950    PRIMARY CARE PHYSICIAN:  Marcene Duos, MD   REASON FOR PRESENTATION:  Evaluate the patient with cardiomyopathy.   HISTORY OF PRESENT ILLNESS:  The patient presents for followup of a  reduced ejection fraction.  He was hospitalized in September 2009 with  congestive heart failure.  I went back in the computer and noted that in  2002, there was a description of reduced ejection fraction.  He was  apparently followed by another cardiologist at that time, but he does  not recall this.  He saw Dr. Delrae Alfred recently and had an echocardiogram  this month.  This demonstrates that his ejection fraction is about 45%  with global hypokinesis.  There is mild mitral regurgitation.  There  were no significant other abnormalities.   Since discharge from the hospital in September, the patient states he  has done relatively well.  He does do a little walking for exercise  about 3 times a week.  He states he could walk a 100-yard football field  without problems.  He might have shortness of breath climbing a flight  of stairs, however.  He does not have any resting shortness of breath.  Denies any PND or orthopnea.  She does not have any palpitations,  presyncope, or syncope.  He states he has never had any chest  discomfort, neck or arm discomfort.  He states his diabetes is well  controlled.  He also has his lipids followed by Dr. Delrae Alfred.   PAST MEDICAL HISTORY:  Hypertension x15 years, hyperlipidemia x1 year,  diabetes mellitus x1 year, hypothyroidism, history of small bowel  obstruction, erectile dysfunction, depression, cholelithiasis,  diverticular disease, and benign prostatic hypertrophy.   PAST SURGICAL HISTORY:  Hernia repair.   ALLERGIES:   None.   MEDICATIONS:  1. Lisinopril 40 mg daily.  2. Celexa 40 mg daily.  3. Aspirin 325 mg daily.  4. Niaspan 1500 mg nightly.  5. Proscar 5 mg daily.  6. Atenolol 50 mg b.i.d.  7. Triamterene/hydrochlorothiazide 75/50 one half q.a.m.  8. Wellbutrin 150 mg q.a.m.  9. Norvasc 5 mg daily.  10.Lovaza 4 g daily.  11.Pravastatin 80 mg daily.   SOCIAL HISTORY:  The patient is disabled and unemployed.  He is single.  He does not have any children.  He does not smoke cigarettes.  He does  not drink alcohol.   FAMILY HISTORY:  Noncontributory for early coronary artery disease,  although his mother died complications of hypertension and diabetes at  56.   REVIEW OF SYSTEMS:  As stated in the HPI and negative for all other  systems.   PHYSICAL EXAMINATION:  GENERAL:  The patient is in no distress.  VITAL SIGNS:  Blood pressure 148/89, heart rate 64 and regular, weight  241 pounds, and body mass index 33.  HEENT:  Eyelids  are unremarkable.  Pupils equal, round, and reactive to  light, fundi within normal limits, oral mucosa unremarkable, and poor  dentition.  NECK:  No jugular venous distention at 45 degrees, carotid upstroke  brisk and symmetrical.  No bruits or thyromegaly.  LYMPHATICS:  No cervical, axillary, or inguinal adenopathy.  LUNGS:  Clear to auscultation bilaterally.  BACK:  No costovertebral angle tenderness.  CHEST:  Unremarkable.  HEART:  PMI not displaced or sustained, S1 and S2 within normal limits,  no S3, no S4, no clicks, rubs, or murmurs.  ABDOMEN:  Morbidly obese, positive bowel sounds normal in frequency and  pitch, no bruits, rebound, guarding, or midline pulsatile mass.  No  hepatomegaly or splenomegaly.  SKIN:  No rashes, no nodules.  EXTREMITIES:  Pulses 2+ throughout, no edema, cyanosis, or clubbing.  NEUROLOGIC:  Oriented to person, place, and time, cranial nerves II  through XII grossly intact, and motor grossly intact.   EKG sinus rhythm, rate 64,  right bundle-branch block, and no acute ST-T  wave changes.   ASSESSMENT AND PLAN:  1. Cardiomyopathy.  The patient has a mildly reduced ejection      fraction.  He seems to have class I symptoms at this point.  I      suspect, this is related to his hypertension.  However, he does not      report any kind of ischemia workup.  Therefore, one is indicated.      He will have an exercise Cardiolite.  If there is no evidence of      ischemia or infarct, we will manage this as a nonischemic      hypertensive cardiomyopathy.  2. Hypertension.  I have taken the liberty of increasing his Norvasc      to 10 mg daily.  He thinks he may be on this dose everywhere I see      listed it is 5 mg.  3. Obesity.  He understands the need to lose weight with diet and      exercise.  4. Dyslipidemia per Dr. Delrae Alfred.  5. Diabetes per Dr. Delrae Alfred.  6. Followup.  I would see the patient back in about 6 months unless      there is an obvious abnormality on the stress perfusion study.     Rollene Rotunda, MD, [facility]  Electronically Signed    JH/MedQ  DD: 05/26/2008  DT: 05/27/2008  Job #: [id number]   cc:   Marcene Duos, M.D.

## 2010-10-12 NOTE — Discharge Summary (Signed)
Patrick [name], Patrick [name]                    ACCOUNT NO.:  [id number]   MEDICAL RECORD NO.:  [id number]          PATIENT TYPE:  INP   LOCATION:  3743                         FACILITY:  [facility]   PHYSICIAN:  Madaline Savage, MD        DATE OF BIRTH:  1950-08-31   DATE OF ADMISSION:  08/21/2007  DATE OF DISCHARGE:  08/24/2007                               DISCHARGE SUMMARY   PRIMARY CARE PHYSICIAN:  [facility].   CONSULTATIONS IN THE HOSPITAL:  He was seen by the surgeon on call with  Critical Care Surgery.   DISCHARGE DIAGNOSES:  1. Small bowel obstruction with narrowing in the ileocecal region.  2. Acute renal failure, which has resolved.  3. Hypokalemia.  4. Hypertension.  5. History of congestive cardiac failure.  6. Diabetes mellitus.  7. Hypothyroidism.   DISCHARGE MEDICATIONS:  1. Celexa 40 mg daily.  2. Lopid 600 mg twice daily.  3. Niaspan 500 mg 3 tablets daily.  4. Potassium chloride 20 mEq daily.  5. Lisinopril 40 mg daily.  6. Triamterene/hydrochlorothiazide 75/50 half tablet daily.  7. Wellbutrin 150 mg daily.  8. Atenolol 50 mg twice daily.  9. Aspirin 325 mg daily.  10.Proscar 5 mg daily.  11.Norvasc 2.5 mg daily.   HISTORY OF PRESENT ILLNESS:  For full history and physical, see the  history and physical dictated on August 21, 2007.  In short, Patrick [name] is  a 60 year old gentleman with a history of congestive cardiac failure  with an ejection fraction of 40%-50%, diabetes mellitus who comes in  with sudden onset of abdominal pain.  His initial evaluation showed that  he had small bowel obstruction and Critical Care Surgery was consulted.   PROCEDURES DONE IN THE HOSPITAL:  1. He had a CT of the abdomen and pelvis done on August 22, 2007, which      shows small bowel obstruction with 2 apparent transition points in      the distal ileum, possibly adhesions.  The proximal area may      contain a focal area of small bowel wall thickening.  2. He had an x-ray of chest,  portable, done on August 22, 2007, which      shows cardiomegaly, pulmonary vascular congestion.  3. This patient had a followup abdominal 2-view x-ray on August 23, 2007, which showed improvement in small bowel obstruction with      distal progression of contrast, cholelithiasis.   PROBLEM LIST:  1. Small bowel obstruction.  Mr. Tant came in with abdominal pain and      CT findings consistent with small bowel obstruction.  There were 2      transition points which were narrowed and so Surgery was consulted.      He was kept n.p.o. and his abdominal pain improved and he was      started on po diet  has had bowel movements and he was tolerating a      regular diet without any difficulty.  At this time, Surgery thinks  that there is some narrowing of the ileocecal valve and he needs a      colonoscopy versus CT enterography in a few weeks once he is      stable.  At this time, I will refer him back to his primary care      doctor who can refer him to a gastroenterologist for a colonoscopy.      I have told the patient that he needs to discuss this with his      primary care doctor.  2. History of congestive cardiac failure.  The patient was at his      baseline while he was in the hospital.  He did have short run of 5      beats of wide-complex tachycardia with a slow rate.  He is on a      beta-blocker, and I will continue him on this.  This patient was      asymptomatic at that time.  3. Acute renal failure.  On admission, his creatinine was elevated at      1.46, which is prerenal.  With hydration, his creatinine is now      back to his baseline.   DISPOSITION:  He is now being discharged home in stable condition.   FOLLOWUP:  He is asked to follow up with his primary care doctor in  [facility] in 1-2 weeks.  He is also asked to get a referral for  gastroenterology from his primary care doctor to get colonoscopy/CT  enterography for further evaluation of his narrowing in the  ileocecal  region.      Madaline Savage, MD  Electronically Signed     PKN/MEDQ  D:  08/24/2007  T:  08/25/2007  Job:  [id number]   cc:   Melvern Banker

## 2010-10-12 NOTE — Consult Note (Signed)
Patrick [name], HORSCH                    ACCOUNT NO.:  [id number]   MEDICAL RECORD NO.:  [id number]          PATIENT TYPE:  INP   LOCATION:  1823                         FACILITY:  [facility]   PHYSICIAN:  Adolph Pollack, M.D.DATE OF BIRTH:  1951/05/22   DATE OF CONSULTATION:  08/22/2007  DATE OF DISCHARGE:                                 CONSULTATION   HISTORY OF PRESENT ILLNESS:  This is a 60 year old male in his normal  state of health until yesterday afternoon when he had the gradual onset  of central abdominal pain that became more and more severe and came in  waves.  It was associated with nausea.  He had two bowel movements  yesterday.  The pain got so bad that he called EMS and was brought to  the emergency department.  Since being here, he has been treated was  some pain medication and is currently pain free at this time.  He has  not passed any gas or had a bowel movement since he had one yesterday  afternoon.  No history of small-bowel obstruction.  He has had a left  inguinal hernia repair, but no intra-abdominal surgeries.  He had x-rays  and a CT scan suggesting small-bowel obstruction, and we were asked to  see him.   PAST MEDICAL HISTORY:  1. Cholelithiasis.  2. Congestive heart failure.  3. Hypertension.  4. Type 2 diabetes mellitus.  5. Hyperlipidemia.  6. Depression.  7. BPH with elevated PSA.   PREVIOUS OPERATIONS:  Left inguinal hernia repair.   ALLERGIES:  None.   MEDICATIONS:  1. Potassium chloride.  2. Atenolol.  3. Lisinopril.  4. Niaspan.  5. Triamterene-HCTZ.  6. Proscar.  7. Aspirin.  8. Lopid.  9. Celexa.  10.Wellbutrin.  11.Norvasc.   SOCIAL HISTORY:  He lives alone and is unemployed currently.  Rare  tobacco use.  Denies alcohol use.   FAMILY HISTORY:  Positive for prostate cancer in his father.   REVIEW OF SYSTEMS:  CARDIOVASCULAR:  He denies any heart attack.  PULMONARY:  He denies COPD, asthma, pneumonia.  GI:  As per HPI.  GU:  He  has got some mild dysuria.  He had a history of a bladder stone.  He  states he has had prostate biopsies by Dr. Patsi Sears, and these did not  show cancer by his report.  ENDOCRINE:  As per past medical history.  NEUROLOGIC:  No strokes or seizures.  HEMATOLOGIC:  No blood clots or  bleeding disorders.   PHYSICAL EXAMINATION:  GENERAL:  A mild to moderately overweight male  who is in no acute distress, pleasant and cooperative.  He is pain and  nausea free at this time.  VITAL SIGNS:  Temperature is 97.8, blood pressure is 122/68, pulse of  78.  RESPIRATORY:  Breath sounds are equal and clear.  Respirations  unlabored.  CARDIOVASCULAR:  A regular rate, regular rhythm.  There is no murmur I  can hear.  ABDOMEN:  Soft, nontender, and does not appear to be distended.  There  are no hernias and no  masses palpable, but he does have high-pitched  rushing bowel sounds.  GU:  Well-healed left inguinal scar.  No hernias.  MUSCULOSKELETAL:  As no edema.   LABORATORY DATA:  A white blood cell count was 16,400 with a hemoglobin  of 17.1.  Glucose is 158, potassium is 4.0.  Lipase is 21.  Liver  function tests normal.   Abdominal x-rays demonstrate some dilated small bowel loops, air fluid  levels, no free air and gallstones.  CT scan demonstrates  cholelithiasis.  There are dilated small bowel loops with a transition  zone in the distal ileum.  Comparison to an August 2008 CT scan  demonstrates a similar finding, and there appears be some sort of an  atypical area in the distal ileum, as well, that was present in August  of 2008.   IMPRESSION:  Abdominal pain and x-ray findings which appear to be  consistent with a partial or small bowel obstruction.  He has a  persistent abnormal finding on CT in the distal ileum area.  Currently,  he is asymptomatic.  Also has multiple medical problems.   PLAN:  I agree with admission, bowel rest and repeating x-rays tomorrow.  If he has some vomiting,  I think an NG tube should be inserted to low  wall suction.      Adolph Pollack, M.D.  Electronically Signed     TJR/MEDQ  D:  08/22/2007  T:  08/22/2007  Job:  [id number]

## 2010-10-15 NOTE — Discharge Summary (Signed)
[facility]  Patient:    BARTLEY, VUOLO Visit Number: [id number] MRN: [id number]          Service Type: MED Location: [phone] Attending Physician:  Darlin Priestly Dictated by:   Jetty Duhamel, M.D. Admit Date:  02/07/2001 Disc. Date: 02/12/01   CC:         Dr. Ellene Route, primary care physician  Lenise Herald, M.D., cardiovascular physician  Anselmo Rod, M.D., GI physician   Discharge Summary  DISCHARGE DIAGNOSES: 1. Intractable watery diarrhea - etiology unknown. 2. Hypokalemia secondary to #1. 3. Newly diagnosed adult onset diabetes mellitus. 4. Hypertension. 5. Presumed gout affecting right ankle and great toe. 6. Negative evaluation for cardiomyopathy with echo revealing no such    disorder.  DISCHARGE MEDICATIONS: 1. Potassium chloride 20 mEq q.d. 2. Cipro 500 mg one b.i.d. for six days. 3. Flagyl 500 mg one t.i.d. for six days. 4. Lotensin 20 mg q.d. 5. Vioxx 25 mg two tablets a day for three days. 6. Hydrochlorothiazide 12.5 mg q.d.  FOLLOW-UP: 1. The patient will keep his scheduled follow-up appointment in 24 hours with    Dr. Ellene Route.  At that time, recheck of basic metabolic panel is    suggested.  Also, ongoing outpatient diabetes education should be    continued. Also, the patient will require basic metabolic panel in    approximately two weeks time to assure that he is tolerating his ACE    inhibitor without difficulty and to reassess the need for potassium    chloride replacement therapy. 2. The patient is instructed to call Anselmo Rod, M.D., for follow-up    GI services. 3. The patient does not need specific cardiovascular follow-up but is    instructed to call the offices of [facility]    p.r.n.  CONSULTATIONS: 1. Initially admitted to the service of Lenise Herald, M.D., with transfer    to the service of Jetty Duhamel, M.D. 2. Dr. Anselmo Rod of  gastroenterology.  PROCEDURES:  None in hospital.  HISTORY OF PRESENT ILLNESS:  Mr. Sudberry is a pleasant 60 year old male who was initially sent to Dr. Loraine Grip office on February 07, 2001, for evaluation of a chest x-ray finding from February 06, 2001, suggesting a massive globular heart.  Echocardiogram was accomplished in the office and was unremarkable and therefore, massive cardiomegaly was ruled out.  Nonetheless, the patient reported to Dr. Jenne Campus symptoms of severe abdominal pain, cramping and five to six daily episodes of watery diarrhea for approximately two days beginning after eating [place] food.  Abdominal x-ray per Dr. Jenne Campus revealed a small-bowel ileus versus an early small-bowel obstruction and multiple gallstones.  Because of his intractable watery diarrhea, the patient was admitted for hospitalization for IV fluid and evaluation.  HOSPITAL COURSE BY PROBLEM:  #1 - DIARRHEA WITH ABDOMINAL PAIN:  The patient was admitted to the hospital and dosed with D5 IV fluid initially.  Dr. Charna Elizabeth was consulted and numerous stool cultures and studies were obtained.  Ultimately, the patients stool was negative for evidence of salmonella Shigella, Campylobacter ___________ or enterotoxigenic E. coli.  The decision was made by Dr. Loreta Ave to treat the patient with an empiric course of Cipro and Flagyl.  The patient tolerated this without difficulty.  Prior to his discharge, the diffuse watery stools had discontinued and the patient was simply having formed loose stools of a normal frequency of recurrence.  It should be  noted that C. diff. toxin was negative as part of his initial evaluation as well.  The feeling was that completion of the empiric course of Cipro and Flagyl was most appropriate and the patient is to contact Dr. Loreta Ave in the outpatient setting for ongoing work-up if symptoms do not completely resolve.  It should be further noted that stool for AFB and fungus was  negative.  #2 - REFRACTORY HYPOKALEMIA:  Shortly after admission, the patient was found to have a potassium of 2.6.  This was accompanied by prolongation of QTC on EKG.  THis was felt to most likely be secondary to the patients liquid diarrhea and therefore repletion was accomplished via IV and p.o. routes. Hypomagnesemia was also addressed to assure that hypokalemia was appropriately corrected.  As the patients watery diarrhea resolved and with aggressive p.o. replacement, the patients potassium did return to a normal level and was stable at 3.3 to 3.7 at the time of his discharge.  He is being discharged on a low dose of potassium chloride on a q.d. basis to be used in conjunction with his diuretic therapy as well as ACE inhibitor.  It is possible that this will be able to be discontinued in the future because of the balancing effect of the ACE inhibitor and the diuretic.  It is recommended that the basic metabolic panel be obtained in approximately two weeks to assure the patients potassium continues to be well balanced and to assess the need for further titration of the patients potassium chloride dosing.  #3 - NEWLY DIAGNOSED DIABETES:  The patient reported that two days prior to his admission, a new diagnosis of diabetes mellitus was made through the office of his primary care physician, Dr. Tiburcio Pea.  CBGs during hospitalization did in fact support this diagnosis with multiple values in 150 to 160 range. After I was called in for consultation, I discontinued his D5 fluid and changed him to normal saline alone and did advance to diabetic diet.  He was able to tolerate this without difficulty and blood sugars ranged in the 120 to 138 range on an appropriately strict diabetic diet.  The decision was made to not initiate a diabetic medication at this time.  The patient was educated extensively as to appropriate diabetic diet through diabetic teaching and  nutrition.  He was connected  with the appropriate outpatient facility to continue this education.  He was provided with a glucometer and instructed on its use and was informed that he should check his blood sugar every morning prior to having breakfast, to record these values and report them to Dr. Tiburcio Pea.  In the future in follow-up, the consideration should be given to initiation of a diabetic medication if blood sugars prove that this will be necessary.  Furthermore, the patient was initiated on an ACE inhibitor for appropriate renal protective effect and it is suggested this is to be continued long-term.  Of note, the patient did have a urinalysis which was positive for protein on February 06, 2001, which further necessitates the use of the ACE inhibitor.  Creatinine clearance during this hospitalization was estimated at 989.  Hemoglobin A1C was upper limits of normal at 6.5.  It is felt that with strict adherence to a diabetic diet, the patient may be able to avoid the use of oral hypoglycemic agents at this time.  #4 - HYPERTENSION:  At the time of admission the patient carried a previous diagnosis of hypertension.  His HCTZ was held throughout initial portion  of his hospitalization, because of his hypokalemia as well as his diarrhea.  Once the patients potassium was repleted and he was cleared for discharge, the decision was made to restart his HCTZ.  This was started at a low dose, however, because of the possibility of the patient having a new diagnosis of gout as well.  Electrolytes should be followed up as noted above.  The need for strict blood pressure control in the setting of diabetes was explained to the patient.  #5 - QUESTIONABLY NEWLY DIAGNOSED GOUT:  During this hospitalization, the patient developed acute episode of right ankle pain with erythema about the ankle and edema in the foot. The foot was exquisitely tender to palpation and warm to touch.  By history, the patient did report  approximately six previous episodes of a similar response; however, he stated that this was focused about his right great toe.  He reports that these resolved with use of ibuprofen 800 mg t.i.d. for a regular course.  Given this history and physical exam, findings with an acute gouty attack, a presumptive diagnosis of acute gout was made.  In that this diagnosis was not reached until approximately a day and a half after the acute onset, decision was made to avoid colchicine or indomethacin and to focus our treatment on use of a selective Cox II inhibitor.  The patients treatment was initiated with Vioxx and he tolerated this without difficulty.  He is discharged to complete a five-day course of maximal Vioxx therapy.  In the future, possibility of initiation of allopurinol should be entertained or Colchicine as an alternative therapy. There is no evidence at this time that the patients gout is severe enough to warrant both.  Of note, a serum uric acid was obtained during this hospitalization and was noted to be at the upper limit of the normal range of 6.4 with the upper normal being 7.0.  Before initiation of colchicine or allopurinol, one might consider a formal diagnosis with arthrocentesis and microscopic confirmation of urate crystals.  At the time of discharge, however, the patient was able to ambulate on his foot and there was no evidence of a septic arthritis.  #6 - QUESTIONABLE MASSIVE CARDIOMYOPATHY:  This entire hospitalization was initially instigated by an emergency room chest x-ray which revealed a "massive globular heart."  As a result, the patient was referred to [facility] where he underwent a two-dimensional echocardiogram the date of his admission.  This per patients report, in fact, revealed that there was no such cardiomyopathy and that his heart was "normal."  No further work-up was felt to be necessary by the cardiology service. The  patient was cleared from this indication but nevertheless did require admission because of the issues listed above.  After initial two to three days of hospitalization under the service of the cardiology service, I was consulted for general medical care and treatment of hypokalemia, newly diagnosed diabetes and possible gout.  In the medical issues predominated, I agreed to transfer the patient to my service on February 10, 2001.  DISCHARGE LABORATORIES:  Sodium 146, potassium 3.7, chloride 109, CO2 28, BUN 9, creatinine 0.9, glucose 138.  QTC of 467 with 81 beats per minute and normal sinus rhythm on a normal EKG. Dictated by:   Jetty Duhamel, M.D. Attending Physician:  Darlin Priestly DD:  02/12/01 TD:  02/12/01 Job: [id number]

## 2010-10-15 NOTE — Consult Note (Signed)
[place]  Patient:    Patrick [name], Patrick [name] Visit Number: [id number] MRN: [id number]          Service Type: MED Location: [phone] Attending Physician:  Darlin Priestly Dictated by:   Jetty Duhamel, M.D. Admit Date:  02/07/2001   CC:         Juluis Mire, M.D.   Consultation Report  DATE OF BIRTH:  November 26, 1950  PRIMARY CARE PHYSICIAN:  Juluis Mire, M.D. - unassigned.  GASTROENTEROLOGIST:  Anselmo Rod, M.D.  CARDIOLOGIST:  [facility].  REASON FOR CONSULTATION:  General medical management.  HISTORY OF PRESENT ILLNESS:  The patient is a pleasant 60 year old gentleman with a previous history of hypertension and recently diagnosed diabetes.  he was admitted to the hospital son February 07, 2001 for evaluation of a questionably enlarged heart as well as symptoms of profuse watery diarrhea with dehydration and hypokalemia.  During his hospitalization, he has been evaluated by Dr. Loreta Ave with gastroenterology and was initiated on Cipro and Flagyl therapy empirically for his diarrhea.  Since that time, his diarrhea has resolved and stool studies have been negative thus far.  Hypokalemia continues, but is being repleted and is slowly responding.  His initially dehydration is resolving, as well, with IV fluid.  He has had a minor setback, however, in that, approximately two days prior to this dictation, he had the acute onset of severe pain in his right foot ankle with swelling and warmth. He reports a history of intermittent symptoms similar to this in his right great toe, which he has previously treated with ibuprofen 800 mg at acute onset.  He reports that he has never had an occurrence in his entire foot or focused about the ankle.  Other than this new complaint, the patient is comfortable at this time, and is beginning to tolerate a full regular diet. He has no further complaints at this time.  REVIEW OF  SYSTEMS:  Entirely negative with the exception of positives noted in the history of present illness above.  MEDICAL HISTORY: 1. Diabetes mellitus diagnosed in the outpatient setting approximately two    days prior to this admission.    a. Urinalysis positive for protein on February 06, 2001.    b. No dietary education to date.    c. No current hemoglobin A1c.    d. Creatinine clearance of 90. 2. Refractory hypokalemia secondary to profuse diarrhea. 3. Refractory profuse diarrhea since February 05, 2001, treated with Cipro and    Flagyl per GI with stool studies negative - not resolved. 4. Hypertension.  Systolics in the 140-170 range. 5. Status post right ankle and hernia repair in 1996. 6. Obesity. 7. Right ankle arthritic pain consistent with questionable gout - no previous    history of crystal diagnosis, but signs and symptoms consistent with gouty    flares involving the right great toe with a total of approximately six    episodes.  MEDICATIONS IN THE OUTPATIENT SETTING:  HCTZ dose unknown.  ALLERGIES:  No known drug allergies.  FAMILY HISTORY:  His father is alive and well with a history of prostate cancer.  He is currently 1.  Mother is deceased at the age of 30 with complications of diabetes mellitus.  SOCIAL HISTORY:  The patient is single.  He lives in [place].  He shares a home with his father.  He denies tobacco, alcohol or illicit drugs.  He works as a Office manager  Technical sales engineer.  PHYSICAL EXAMINATION:  GENERAL:  Well-appearing 60 year old male who is pleasant in no acute respiratory distress sitting on the side of the bed.  VITAL SIGNS:  Temperature 97.1, blood pressure 160/85, heart rate 85, respiratory rate 18, oxygen saturation 96% on room air.  HEENT:  Normocephalic and atraumatic.  Pupils equal, round and reactive to light and accommodation.  Extraocular movements intact bilaterally.  Poor dental condition with multiple carious teeth, but OC/OP clear  otherwise.  NECK:  No lymphadenopathy or thyromegaly.  CARDIOVASCULAR:  Regular rate and rhythm without murmurs, rubs, or gallops. Normal S1 and S2.  LUNGS:  Clear to auscultation bilaterally without wheezes or rhonchi.  ABDOMEN:  Nontender and nondistended.  Soft bowel sounds present.  No hepatosplenomegaly.  No rebound.  No ascites.  EXTREMITIES:  No clubbing, cyanosis, or edema of bilateral lower extremities with the exception of pedal edema of the right foot with mild erythema and calor and tenderness to palpation about the right ankle and into the feet.  NEUROLOGIC:  Strength is 5/5 in upper and lower extremities.  Intact sensation to touch throughout.  No Babinski.  LABORATORY DATA:  CBG 133.  Sodium 137, potassium 3.1, chloride 106, CO2 28, albumin 6, creatinine 1.0, glucose 181, calcium 7.9, magnesium 1.7.  From September 12, TSH 1.93.  From this admission, lipase 17.  Normal coag studies.  EKG reveals normal sinus rhythm, 87 beats per minute.  Prolonged QTC and 5/5.  Acute abdominal series from September 10 showing massive, globular heart with questionable effusion, calcified gallstones and ileus versus small bowel obstruction.  Chest x-ray on September 11 revealing no change of cardiac shadow.  IMPRESSION AND RECOMMENDATIONS: 1. Newly-diagnosed diabetes mellitus.    a. We will begin ACE inhibitor, especially given the proteinuria noted on       his admission urinalysis.    b. I will consult nutrition for diabetic education, as I believe there is a       reasonable chance we can control this patients diabetes with       appropriate diet alone.    c. We will discontinue his D5 IV fluid.    d. We will check a hemoglobin A1c.    e. We will withhold oral diabetic agent at this time and follow CBG off of       D5 IV fluids to get a better idea of his baseline CBG.    f. We will begin an aspirin per day after his acute gouty flare has       resolved.     g. We will  attempt to provide the patient with a glucometer and instruct       him on proper monitoring of his CBG. 2. Hypokalemia - The patient is severely hypokalemic, and I agree it is likely    secondary to severe total body deficit because of significant prolonged    diarrhea.  I will push additional p.o. boluses of potassium chloride today    and follow up his level in the morning.  I agree with empiric magnesium    replacement, as well. 3. Hypertension - I will resume low dose of hydrochlorothiazide after the    patients gouty flare has resolved, with preference to a 12.5 mg q.d.    dosing.  I will initiate ACE inhibitor therapy at this time as discussed    above, and will discontinue his beta blocker in order to allow for maximum    ACE inhibitor titration in the  future.  Our goal, of course, is blood    pressure control with systolics in the 110-125 range. 4. Probable gout - The patient has a history consistent with gout, considering    his approximately six prior episodes of right great toe warmth, erythema    and acute pain.  His physical examination at this time would support the    diagnosis of gout involving the right ankle and foot.  At this point, he is    too far out for acute high dose Indocin to have its maximum effect.  I will    begin a COX-II inhibitor for an expected 5- to 7-day course and continue    empiric Protonix.  We will discontinue prescribed Allopurinol, as it has no    role in an acute flare, and I will choose to avoid Colchicine at this point    in the setting of the patients recent diarrhea.  As noted above, when his    hydrochlorothiazide is restarted, we will use a low dose so as to attempt    to not deleteriously affect his serum uric acid. 5. Diarrhea - As per GI.  Thank you for the consult on this pleasant gentleman.  At this time, it appears that medical issues predominate, and the patient has been cleared per the history, which he provides be, by cardiology,  with an apparently unremarkable echocardiogram.  As a result, I will be glad to transfer the patient to my service, though I do not feel that his inpatient stay will extend much longer.  It is likely if we are able to achieve steady state with his potassium, he will be discharged in the next 24-48 hours. Dictated by:   Jetty Duhamel, M.D. Attending Physician:  Darlin Priestly DD:  02/10/01 TD:  02/10/01 Job: [id number]

## 2010-11-15 ENCOUNTER — Encounter (HOSPITAL_BASED_OUTPATIENT_CLINIC_OR_DEPARTMENT_OTHER): Payer: Self-pay | Admitting: Internal Medicine

## 2010-11-15 ENCOUNTER — Other Ambulatory Visit: Payer: Self-pay | Admitting: Internal Medicine

## 2010-11-15 DIAGNOSIS — D696 Thrombocytopenia, unspecified: Secondary | ICD-10-CM

## 2010-11-15 LAB — CBC WITH DIFFERENTIAL/PLATELET
Basophils Absolute: 0 10*3/uL (ref 0.0–0.1)
EOS%: 1.7 % (ref 0.0–7.0)
MCH: 31.1 pg (ref 27.2–33.4)
MCV: 90 fL (ref 79.3–98.0)
MONO%: 7.2 % (ref 0.0–14.0)
RBC: 4.6 10*6/uL (ref 4.20–5.82)
RDW: 14.3 % (ref 11.0–14.6)

## 2010-11-15 LAB — COMPREHENSIVE METABOLIC PANEL
AST: 13 U/L (ref 0–37)
Albumin: 4.3 g/dL (ref 3.5–5.2)
Alkaline Phosphatase: 54 U/L (ref 39–117)
BUN: 18 mg/dL (ref 6–23)
Potassium: 3.3 mEq/L — ABNORMAL LOW (ref 3.5–5.3)

## 2010-12-10 ENCOUNTER — Other Ambulatory Visit: Payer: Self-pay | Admitting: Internal Medicine

## 2010-12-10 DIAGNOSIS — R609 Edema, unspecified: Secondary | ICD-10-CM

## 2010-12-11 ENCOUNTER — Ambulatory Visit (HOSPITAL_COMMUNITY)
Admission: RE | Admit: 2010-12-11 | Discharge: 2010-12-11 | Disposition: A | Discharge status: Home / Self Care | Payer: Self-pay | Source: Ambulatory Visit | Attending: Internal Medicine | Admitting: Internal Medicine

## 2010-12-11 ENCOUNTER — Other Ambulatory Visit: Payer: Self-pay | Admitting: Internal Medicine

## 2010-12-11 DIAGNOSIS — R52 Pain, unspecified: Secondary | ICD-10-CM

## 2010-12-11 DIAGNOSIS — R609 Edema, unspecified: Secondary | ICD-10-CM

## 2010-12-11 DIAGNOSIS — Z01818 Encounter for other preprocedural examination: Secondary | ICD-10-CM | POA: Insufficient documentation

## 2010-12-11 DIAGNOSIS — M7989 Other specified soft tissue disorders: Secondary | ICD-10-CM | POA: Insufficient documentation

## 2010-12-11 DIAGNOSIS — M7020 Olecranon bursitis, unspecified elbow: Secondary | ICD-10-CM | POA: Insufficient documentation

## 2010-12-11 MED ORDER — GADOBENATE DIMEGLUMINE 529 MG/ML IV SOLN
20.0000 mL | Freq: Once | INTRAVENOUS | Status: AC | PRN
  Administered 2010-12-11: 20 mL via INTRAVENOUS

## 2011-02-21 LAB — DIFFERENTIAL
Basophils Absolute: 0.1
Lymphocytes Relative: 5 — ABNORMAL LOW
Lymphs Abs: 0.8
Neutro Abs: 14.6 — ABNORMAL HIGH
Neutrophils Relative %: 89 — ABNORMAL HIGH

## 2011-02-21 LAB — CBC
HCT: 39.9
HCT: 40
HCT: 49.1
Hemoglobin: 14
Hemoglobin: 14
Hemoglobin: 17.1 — ABNORMAL HIGH
MCHC: 34.8
MCV: 90.6
Platelets: 187
RBC: 4.43
RBC: 4.49
RBC: 5.42
RDW: 13.6
RDW: 13.6
WBC: 7.8
WBC: 8.3

## 2011-02-21 LAB — COMPREHENSIVE METABOLIC PANEL
Albumin: 3.4 — ABNORMAL LOW
BUN: 16
BUN: 18
CO2: 28
Calcium: 9.2
Chloride: 105
Creatinine, Ser: 1.3
Creatinine, Ser: 1.46
GFR calc Af Amer: 60 — ABNORMAL LOW
GFR calc non Af Amer: 49 — ABNORMAL LOW
Glucose, Bld: 158 — ABNORMAL HIGH
Total Bilirubin: 0.8
Total Protein: 6.4

## 2011-02-21 LAB — URINALYSIS, ROUTINE W REFLEX MICROSCOPIC
Ketones, ur: NEGATIVE
Nitrite: NEGATIVE
Protein, ur: NEGATIVE
pH: 5.5

## 2011-02-21 LAB — CK TOTAL AND CKMB (NOT AT ARMC)
CK, MB: 1.9
CK, MB: 2.4
CK, MB: 3
Total CK: 66

## 2011-02-21 LAB — RAPID URINE DRUG SCREEN, HOSP PERFORMED
Amphetamines: NOT DETECTED
Cocaine: NOT DETECTED
Opiates: POSITIVE — AB
Tetrahydrocannabinol: NOT DETECTED

## 2011-02-21 LAB — TROPONIN I
Troponin I: 0.02
Troponin I: 0.02

## 2011-02-21 LAB — BASIC METABOLIC PANEL
BUN: 11
Calcium: 8.4
Chloride: 105
GFR calc Af Amer: >60
GFR calc Af Amer: >60
GFR calc non Af Amer: >60
GFR calc non Af Amer: >60
Glucose, Bld: 80
Glucose, Bld: 88
Potassium: 3.1 — ABNORMAL LOW
Potassium: 3.3 — ABNORMAL LOW
Potassium: 3.4 — ABNORMAL LOW
Sodium: 139
Sodium: 140
Sodium: 141

## 2011-02-21 LAB — LIPASE, BLOOD: Lipase: 21

## 2011-02-21 LAB — OCCULT BLOOD X 1 CARD TO LAB, STOOL: Fecal Occult Bld: NEGATIVE

## 2011-02-21 LAB — MAGNESIUM: Magnesium: 1.9

## 2011-02-21 LAB — HEMOGLOBIN A1C: Mean Plasma Glucose: 140

## 2011-02-23 ENCOUNTER — Other Ambulatory Visit: Payer: Self-pay | Admitting: Internal Medicine

## 2011-02-23 ENCOUNTER — Encounter (HOSPITAL_BASED_OUTPATIENT_CLINIC_OR_DEPARTMENT_OTHER): Payer: Self-pay | Admitting: Internal Medicine

## 2011-02-23 DIAGNOSIS — D696 Thrombocytopenia, unspecified: Secondary | ICD-10-CM

## 2011-02-23 LAB — CBC WITH DIFFERENTIAL/PLATELET
BASO%: 0.5 % (ref 0.0–2.0)
Basophils Absolute: 0 10*3/uL (ref 0.0–0.1)
HCT: 42.8 % (ref 38.4–49.9)
HGB: 14.9 g/dL (ref 13.0–17.1)
LYMPH%: 20.2 % (ref 14.0–49.0)
MCH: 29.6 pg (ref 27.2–33.4)
MCHC: 34.8 g/dL (ref 32.0–36.0)
MONO#: 0.4 10*3/uL (ref 0.1–0.9)
NEUT%: 73.1 % (ref 39.0–75.0)
Platelets: 174 10*3/uL (ref 140–400)
lymph#: 1.7 10*3/uL (ref 0.9–3.3)

## 2011-03-02 LAB — DIFFERENTIAL
Basophils Absolute: 0
Lymphocytes Relative: 11 — ABNORMAL LOW
Lymphs Abs: 0.9
Monocytes Absolute: 0.7
Neutro Abs: 6.3

## 2011-03-02 LAB — URINALYSIS, ROUTINE W REFLEX MICROSCOPIC
Glucose, UA: NEGATIVE
Protein, ur: NEGATIVE
Urobilinogen, UA: 0.2

## 2011-03-02 LAB — POCT CARDIAC MARKERS
Troponin i, poc: <0.05
Troponin i, poc: <0.05

## 2011-03-02 LAB — LIPID PANEL
Cholesterol: 131
LDL Cholesterol: 86

## 2011-03-02 LAB — CBC
HCT: 41.7
MCHC: 33.7
MCV: 89.5
Platelets: 195
RDW: 13.2

## 2011-03-02 LAB — COMPREHENSIVE METABOLIC PANEL
Albumin: 3.6
BUN: 16
Calcium: 9.1
Chloride: 105
Creatinine, Ser: 1.14
Total Bilirubin: 0.8
Total Protein: 6.8

## 2011-03-02 LAB — CARDIAC PANEL(CRET KIN+CKTOT+MB+TROPI)
CK, MB: 1.4
Relative Index: INVALID
Total CK: 32
Total CK: 34

## 2011-03-02 LAB — BASIC METABOLIC PANEL
BUN: 17
BUN: 17
Chloride: 103
GFR calc non Af Amer: 58 — ABNORMAL LOW
Glucose, Bld: 94
Potassium: 3.3 — ABNORMAL LOW
Potassium: 3.4 — ABNORMAL LOW
Sodium: 141

## 2011-03-02 LAB — RAPID URINE DRUG SCREEN, HOSP PERFORMED
Amphetamines: NOT DETECTED
Benzodiazepines: NOT DETECTED

## 2011-03-02 LAB — GLUCOSE, CAPILLARY: Glucose-Capillary: 137 — ABNORMAL HIGH

## 2011-03-02 LAB — MAGNESIUM: Magnesium: 2.3

## 2011-03-02 LAB — CK TOTAL AND CKMB (NOT AT ARMC): Relative Index: INVALID

## 2011-03-14 LAB — POCT CARDIAC MARKERS
CKMB, poc: 4.5
Myoglobin, poc: 63.7
Operator id: 257131

## 2011-03-14 LAB — DIFFERENTIAL
Basophils Absolute: 0
Basophils Relative: 0
Eosinophils Absolute: 0.1
Eosinophils Relative: 1
Monocytes Absolute: 1 — ABNORMAL HIGH
Neutro Abs: 10.6 — ABNORMAL HIGH

## 2011-03-14 LAB — URINALYSIS, ROUTINE W REFLEX MICROSCOPIC
Bilirubin Urine: NEGATIVE
Glucose, UA: NEGATIVE
Hgb urine dipstick: NEGATIVE
Ketones, ur: NEGATIVE
Protein, ur: 100 — AB
Urobilinogen, UA: 0.2

## 2011-03-14 LAB — COMPREHENSIVE METABOLIC PANEL
ALT: 16
AST: 20
Albumin: 4.2
Alkaline Phosphatase: 70
BUN: 20
Chloride: 99
Potassium: 4
Sodium: 141
Total Bilirubin: 1.3 — ABNORMAL HIGH

## 2011-03-14 LAB — CBC
HCT: 48.7
Platelets: 186
WBC: 12.9 — ABNORMAL HIGH

## 2011-04-07 ENCOUNTER — Other Ambulatory Visit: Payer: Self-pay | Admitting: Internal Medicine

## 2011-04-07 DIAGNOSIS — R413 Other amnesia: Secondary | ICD-10-CM

## 2011-04-14 ENCOUNTER — Other Ambulatory Visit (HOSPITAL_COMMUNITY): Payer: Self-pay

## 2011-04-15 ENCOUNTER — Ambulatory Visit (HOSPITAL_COMMUNITY)
Admission: RE | Admit: 2011-04-15 | Discharge: 2011-04-15 | Disposition: A | Discharge status: Home / Self Care | Payer: Self-pay | Source: Ambulatory Visit | Attending: Internal Medicine | Admitting: Internal Medicine

## 2011-04-15 DIAGNOSIS — F29 Unspecified psychosis not due to a substance or known physiological condition: Secondary | ICD-10-CM | POA: Insufficient documentation

## 2011-04-15 DIAGNOSIS — G319 Degenerative disease of nervous system, unspecified: Secondary | ICD-10-CM | POA: Insufficient documentation

## 2011-04-15 DIAGNOSIS — R413 Other amnesia: Secondary | ICD-10-CM | POA: Insufficient documentation

## 2011-04-15 MED ORDER — GADOBENATE DIMEGLUMINE 529 MG/ML IV SOLN
20.0000 mL | Freq: Once | INTRAVENOUS | Status: AC
  Administered 2011-04-15: 20 mL via INTRAVENOUS

## 2011-10-29 ENCOUNTER — Emergency Department (HOSPITAL_COMMUNITY)
Admission: EM | Admit: 2011-10-29 | Discharge: 2011-10-29 | Disposition: A | Discharge status: Home / Self Care | Payer: No Typology Code available for payment source | Attending: Emergency Medicine | Admitting: Emergency Medicine

## 2011-10-29 ENCOUNTER — Emergency Department (HOSPITAL_COMMUNITY): Payer: No Typology Code available for payment source

## 2011-10-29 ENCOUNTER — Encounter (HOSPITAL_COMMUNITY): Payer: Self-pay | Admitting: *Deleted

## 2011-10-29 DIAGNOSIS — R51 Headache: Secondary | ICD-10-CM | POA: Insufficient documentation

## 2011-10-29 DIAGNOSIS — M542 Cervicalgia: Secondary | ICD-10-CM | POA: Insufficient documentation

## 2011-10-29 DIAGNOSIS — I10 Essential (primary) hypertension: Secondary | ICD-10-CM | POA: Insufficient documentation

## 2011-10-29 DIAGNOSIS — S139XXA Sprain of joints and ligaments of unspecified parts of neck, initial encounter: Secondary | ICD-10-CM | POA: Insufficient documentation

## 2011-10-29 DIAGNOSIS — R42 Dizziness and giddiness: Secondary | ICD-10-CM | POA: Insufficient documentation

## 2011-10-29 DIAGNOSIS — S161XXA Strain of muscle, fascia and tendon at neck level, initial encounter: Secondary | ICD-10-CM

## 2011-10-29 HISTORY — DX: Essential (primary) hypertension: I10

## 2011-10-29 NOTE — ED Notes (Signed)
Pt ambulated to bathroom 
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
## 2011-10-29 NOTE — ED Notes (Signed)
Patient given discharge instructions, information, prescriptions, and diet order. Patient states that they adequately understand discharge information given and to return to ED if symptoms return or worsen.     
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
## 2011-10-29 NOTE — ED Notes (Signed)
[id number]<AV> Expected date:10/29/11<BR> Expected time: 7:39 PM<BR> Means of arrival:Ambulance<BR> Comments:<BR> Seizure, Hx of same

## 2012-01-06 ENCOUNTER — Emergency Department (HOSPITAL_COMMUNITY)
Admission: EM | Admit: 2012-01-06 | Discharge: 2012-01-06 | Disposition: A | Discharge status: Home / Self Care | Payer: Self-pay | Attending: Emergency Medicine | Admitting: Emergency Medicine

## 2012-01-06 ENCOUNTER — Emergency Department (HOSPITAL_COMMUNITY): Payer: Self-pay

## 2012-01-06 ENCOUNTER — Encounter (HOSPITAL_COMMUNITY): Payer: Self-pay | Admitting: Emergency Medicine

## 2012-01-06 DIAGNOSIS — F17200 Nicotine dependence, unspecified, uncomplicated: Secondary | ICD-10-CM | POA: Insufficient documentation

## 2012-01-06 DIAGNOSIS — M25569 Pain in unspecified knee: Secondary | ICD-10-CM

## 2012-01-06 DIAGNOSIS — I509 Heart failure, unspecified: Secondary | ICD-10-CM | POA: Insufficient documentation

## 2012-01-06 DIAGNOSIS — M1990 Unspecified osteoarthritis, unspecified site: Secondary | ICD-10-CM

## 2012-01-06 DIAGNOSIS — M1710 Unilateral primary osteoarthritis, unspecified knee: Secondary | ICD-10-CM | POA: Insufficient documentation

## 2012-01-06 DIAGNOSIS — E109 Type 1 diabetes mellitus without complications: Secondary | ICD-10-CM | POA: Insufficient documentation

## 2012-01-06 DIAGNOSIS — I10 Essential (primary) hypertension: Secondary | ICD-10-CM | POA: Insufficient documentation

## 2012-01-06 MED ORDER — HYDROCODONE-ACETAMINOPHEN 5-325 MG PO TABS: 1.0000 | ORAL_TABLET | ORAL | Status: AC | PRN
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
## 2012-03-15 ENCOUNTER — Ambulatory Visit: Payer: Self-pay | Admitting: Family Medicine

## 2012-03-22 ENCOUNTER — Encounter: Payer: Self-pay | Admitting: Family Medicine

## 2012-03-22 ENCOUNTER — Other Ambulatory Visit: Payer: Self-pay | Admitting: Family Medicine

## 2012-03-22 ENCOUNTER — Ambulatory Visit (INDEPENDENT_AMBULATORY_CARE_PROVIDER_SITE_OTHER): Payer: Self-pay | Admitting: Family Medicine

## 2012-03-22 VITALS — BP 147/84 | HR 66 | Temp 97.7°F | Ht 69.0 in | Wt 229.0 lb

## 2012-03-22 DIAGNOSIS — E119 Type 2 diabetes mellitus without complications: Secondary | ICD-10-CM

## 2012-03-22 DIAGNOSIS — I10 Essential (primary) hypertension: Secondary | ICD-10-CM

## 2012-03-22 DIAGNOSIS — I509 Heart failure, unspecified: Secondary | ICD-10-CM

## 2012-03-22 LAB — POCT GLYCOSYLATED HEMOGLOBIN (HGB A1C): Hemoglobin A1C: 5.4

## 2012-03-22 MED ORDER — TRIAMTERENE-HCTZ 75-50 MG PO TABS: 0.5000 | ORAL_TABLET | Freq: Every day | ORAL | Status: DC

## 2012-03-22 MED ORDER — ATORVASTATIN CALCIUM 10 MG PO TABS: 5.0000 mg | ORAL_TABLET | ORAL | Status: DC

## 2012-03-22 MED ORDER — POTASSIUM CHLORIDE ER 10 MEQ PO TBCR: 20.0000 meq | EXTENDED_RELEASE_TABLET | Freq: Every day | ORAL | Status: DC

## 2012-03-22 MED ORDER — LISINOPRIL 40 MG PO TABS: 40.0000 mg | ORAL_TABLET | Freq: Every day | ORAL | Status: DC

## 2012-03-22 MED ORDER — ASPIRIN 325 MG PO TABS: 325.0000 mg | ORAL_TABLET | Freq: Every day | ORAL | Status: DC

## 2012-03-22 MED ORDER — OMEGA-3-ACID ETHYL ESTERS 1 G PO CAPS: 2.0000 g | ORAL_CAPSULE | Freq: Two times a day (BID) | ORAL | Status: DC

## 2012-03-22 MED ORDER — FUROSEMIDE 40 MG PO TABS: 40.0000 mg | ORAL_TABLET | Freq: Every day | ORAL | Status: DC

## 2012-03-22 MED ORDER — CARVEDILOL 25 MG PO TABS: 25.0000 mg | ORAL_TABLET | Freq: Two times a day (BID) | ORAL | Status: DC

## 2012-03-22 MED ORDER — AMLODIPINE BESYLATE 10 MG PO TABS: 10.0000 mg | ORAL_TABLET | Freq: Every day | ORAL | Status: DC

## 2012-03-22 MED ORDER — FINASTERIDE 5 MG PO TABS: 5.0000 mg | ORAL_TABLET | Freq: Every day | ORAL | Status: DC

## 2012-03-22 NOTE — Assessment & Plan Note (Signed)
Slightly elevated but patient says this is "good for him."  Will continue current medications, (all refilled) and monitor.

## 2012-03-22 NOTE — Progress Notes (Signed)
  Subjective:    Patient ID: Patrick [name], male    DOB: 05/09/1951, 61 y.o.   MRN: [id number]  HPI  Patrick [name] comes in to establish care.   He carries a diagnosis of DM II, but has never been on medications for it, he says he controls it with his diet.  He does not check blood sugars at home, he says he has not had polyuria or polydipsia.   CV- history of HTN and CHF, taking Amlodipine, Lasix, Lisinopril, Maxzide.  He denies chest pain, palpitations, weight gain, LE edema.   Past Medical History  Diagnosis Date  . Diabetes 1.5, managed as type 1   . CHF (congestive heart failure)   . Hypertension    Family History  Problem Relation Age of Onset  . Hypertension Mother   . Diabetes Mother   . Stroke Mother   . Cancer Father 27    Prostate  . Dementia Father   . COPD Sister   . Arthritis Sister   . Edema Sister    History  Substance Use Topics  . Smoking status: Passive Smoke Exposure - Never Smoker    Types: Pipe, Cigars  . Smokeless tobacco: Not on file  . Alcohol Use: No     Review of Systems See HPI    Objective:   Physical Exam  BP 147/84  Pulse 66  Temp 97.7 F (36.5 C) (Oral)  Ht 5\' 9"  (1.753 m)  Wt 229 lb (103.874 kg)  BMI 33.82 kg/m2 General appearance: alert, cooperative and no distress Neck: supple, symmetrical, trachea midline and thyroid not enlarged, symmetric, no tenderness/mass/nodules Lungs: clear to auscultation bilaterally Heart: regular rate and rhythm, S1, S2 normal, no murmur, click, rub or gallop Extremities: extremities normal, atraumatic, no cyanosis or edema      Assessment & Plan:

## 2012-03-22 NOTE — Assessment & Plan Note (Signed)
A1C 5.4 today, no changes today.

## 2012-03-22 NOTE — Patient Instructions (Signed)
It was nice to meet you.  I have refilled all of your medications- please see if the [facility] is open or try to find out at which pharmacy your medications are cheapest.   Please make an appointment to come back and see me in about one month after you have the orange card.  If you remember, come in fasting so we can do your blood work.

## 2012-03-22 NOTE — Assessment & Plan Note (Signed)
Well compensated, refill current medications, and plan on checking labs next visit.

## 2012-04-10 ENCOUNTER — Telehealth: Payer: Self-pay | Admitting: Family Medicine

## 2012-04-10 NOTE — Telephone Encounter (Signed)
Patient is calling because he needs a refill on Trazadone.  His last appt, was his first appt and he said he discussed increasing the dose since what he currently takes, 150mg , doesn't seem to be working.  He would like it sent to [facility] on [address].

## 2012-04-11 NOTE — Telephone Encounter (Signed)
I am not sure about the conversation regarding Trazodone and am covering Dr. Melina Modena box today.  I left message with patient informing him that Dr. Lula Olszewski is not in town today and that we will try to address this medication tomorrow or Friday.  He was asked to call back if this will be an issue (having medication addressed in 1-2 days instead of today).

## 2012-04-16 ENCOUNTER — Other Ambulatory Visit: Payer: Self-pay | Admitting: Family Medicine

## 2012-04-16 MED ORDER — TRAZODONE HCL 50 MG PO TABS: 50.0000 mg | ORAL_TABLET | Freq: Every day | ORAL | Status: DC

## 2012-04-16 NOTE — Telephone Encounter (Signed)
Rx sent 

## 2012-04-16 NOTE — Telephone Encounter (Signed)
Patient is calling for a refill on Trazadone to be sent to [facility] on [address].

## 2012-04-18 ENCOUNTER — Telehealth: Payer: Self-pay | Admitting: Family Medicine

## 2012-04-18 NOTE — Telephone Encounter (Signed)
Patient is calling because the dose of the Trazadone isn't working for him anymore and he is struggling with getting sleep.  He left a message last week, but says he did not hear back anything.  The previous encounter says differently.

## 2012-04-19 NOTE — Telephone Encounter (Signed)
Patient will need to be seen for sleep medication changes. He has an appointment in one week, we can talk about it then or sooner if I have open appointments and he feels it is urgent.

## 2012-04-19 NOTE — Telephone Encounter (Signed)
LMOVM informing pt that we would discuss "the question he called with at his visit on Wednesday". [name], Maryjo Rochester

## 2012-04-20 NOTE — Telephone Encounter (Signed)
Pt called back and I informed him that  Dr. Lula Olszewski will discuss this matter with him on Wednesday when he comes in however, pt informed me that he is unable to come into the office due to not having been recertified for the [facility] card at this point and has not gotten an appt with Britta Mccreedy although he has called to be seen by her. I asked him if he could bring in $20 on Wednesday when he came in and he stated that he cannot afford to do so and is asking if Dr. Lula Olszewski would call him back regarding his question.Laureen Ochs, Viann Shove

## 2012-04-24 NOTE — Telephone Encounter (Signed)
Called patient back.  He is on the waiting list for the [facility] card.  He says he cannot afford to pay co-pay.  I told him I was sorry for the delay.  I also told him that unfortunately, I do not feel comfortable making changes on medications over the phone, that I would have to see him in the office to increase trazodone.  He says he understands.

## 2012-04-25 ENCOUNTER — Ambulatory Visit: Payer: Self-pay | Admitting: Family Medicine

## 2012-06-01 ENCOUNTER — Ambulatory Visit: Payer: Self-pay | Admitting: Family Medicine

## 2012-06-14 ENCOUNTER — Other Ambulatory Visit: Payer: Self-pay | Admitting: *Deleted

## 2012-06-15 MED ORDER — CARVEDILOL 25 MG PO TABS: 25.0000 mg | ORAL_TABLET | Freq: Two times a day (BID) | ORAL | Status: DC

## 2012-06-22 ENCOUNTER — Ambulatory Visit (INDEPENDENT_AMBULATORY_CARE_PROVIDER_SITE_OTHER): Payer: No Typology Code available for payment source | Admitting: Family Medicine

## 2012-06-22 ENCOUNTER — Encounter: Payer: Self-pay | Admitting: Family Medicine

## 2012-06-22 VITALS — BP 131/90 | HR 71 | Temp 98.4°F | Ht 69.0 in | Wt 226.0 lb

## 2012-06-22 DIAGNOSIS — I10 Essential (primary) hypertension: Secondary | ICD-10-CM

## 2012-06-22 DIAGNOSIS — E785 Hyperlipidemia, unspecified: Secondary | ICD-10-CM

## 2012-06-22 DIAGNOSIS — E119 Type 2 diabetes mellitus without complications: Secondary | ICD-10-CM

## 2012-06-22 DIAGNOSIS — G4700 Insomnia, unspecified: Secondary | ICD-10-CM

## 2012-06-22 DIAGNOSIS — Z23 Encounter for immunization: Secondary | ICD-10-CM

## 2012-06-22 LAB — BASIC METABOLIC PANEL
BUN: 16 mg/dL (ref 6–23)
CO2: 33 mEq/L — ABNORMAL HIGH (ref 19–32)
Chloride: 101 mEq/L (ref 96–112)
Creat: 0.98 mg/dL (ref 0.50–1.35)

## 2012-06-22 LAB — LIPID PANEL
Cholesterol: 116 mg/dL (ref 0–200)
Total CHOL/HDL Ratio: 4 Ratio

## 2012-06-22 MED ORDER — IBUPROFEN 800 MG PO TABS: 800.0000 mg | ORAL_TABLET | Freq: Three times a day (TID) | ORAL | Status: DC | PRN

## 2012-06-22 MED ORDER — TRAZODONE HCL 150 MG PO TABS: 150.0000 mg | ORAL_TABLET | Freq: Every day | ORAL | Status: DC

## 2012-06-22 MED ORDER — LISINOPRIL 20 MG PO TABS: 20.0000 mg | ORAL_TABLET | Freq: Every day | ORAL | Status: DC

## 2012-06-22 MED ORDER — TRIAMTERENE-HCTZ 75-50 MG PO TABS: 0.5000 | ORAL_TABLET | Freq: Every day | ORAL | Status: DC

## 2012-06-22 MED ORDER — CARVEDILOL 25 MG PO TABS: 25.0000 mg | ORAL_TABLET | Freq: Two times a day (BID) | ORAL | Status: DC

## 2012-06-22 MED ORDER — FINASTERIDE 5 MG PO TABS: 5.0000 mg | ORAL_TABLET | Freq: Every day | ORAL | Status: DC

## 2012-06-22 MED ORDER — LOVASTATIN 20 MG PO TABS: 20.0000 mg | ORAL_TABLET | Freq: Every day | ORAL | Status: DC

## 2012-06-22 MED ORDER — FUROSEMIDE 40 MG PO TABS: 40.0000 mg | ORAL_TABLET | Freq: Every day | ORAL | Status: DC

## 2012-06-22 NOTE — Assessment & Plan Note (Signed)
Discussed sleep hygiene, bedtime routines, setting bed time and wake time.  Rx for trazodone 150 mg PO QHS.

## 2012-06-22 NOTE — Assessment & Plan Note (Signed)
Relatively good control without amlodipine or lisinopril, but diastolic slightly high.  Will re-start lisinopril at lower dose considering diet controlled DM.

## 2012-06-22 NOTE — Patient Instructions (Signed)
It was good to see you today.  Your Hemoglobin A1C is  Lab Results  Component Value Date   HGBA1C 5.4 06/22/2012  .  Remember your goal for A1C is less than 7.  Your goal for fasting morning blood sugar is 80-120.    Your blood pressure today was BP: 131/90 mmHg.  Remember your goal blood pressure is about 130/80.  Please be sure to take your medication every day.  I have taken amlodipine off your medication list, you do not need it.  However, you do need to take Lisinopril 20 mg daily in addition to the tramterene-HCTZ, the Carvedilol and the furosemide.   For your sleep, please try the trazadone 150 mg at bedtime.   I will send you a letter with your lab results, or call you if anything is abnormal.

## 2012-06-22 NOTE — Assessment & Plan Note (Signed)
Pt is fasting today, will check lipids, also rx for Lovastatin as this is on $4 list, will have him d/c atorvastatin when he finishes his current bottle.

## 2012-06-22 NOTE — Progress Notes (Signed)
  Subjective:    Patient ID: Patrick [name], male    DOB: 1950-07-20, 62 y.o.   MRN: [id number]  HPI  Mr. Balistreri returns for follow up.    CV: Taking maxzide, coreg, lasix without difficulty.  Was not able to get the lisinopril because 40mg  tablet not on $4 list at [facility]. Denies chest pain, dizziness, palpitations, LE edema.  Patient does not check blood pressures.  DM: diet controlled, does not take medications for it, does not check blood sugars.  No hyper or hypoglycemic episodes, no polyuria or polydypisa.  Taking ASA, stopped taking Ace Inhibitor.  HLD: Patient is taking lipitor without difficulty.  Last lipid profile was 2011.  Patient is making lifestyle modifications. He says that when he runs out of the lipitor he has he will no longer be able to afford it, wants a statin on the $4 list.   Insomina- was taking trazadone 150 mg for sleep, but Rx was for 50 mg.  He says he does not have a tv in his bedroom but he watches TV until right before he goes to bed.  He says that he wakes up 10 times a night.  Only has to go to the bathroom once.  Does not feel rested.  Not working, so he stays up late, sleeps in.   Past Medical History  Diagnosis Date  . CHF (congestive heart failure)   . Hypertension   . DM II (diabetes mellitus, type II), controlled     Diet controlled.    Family History  Problem Relation Age of Onset  . Hypertension Mother   . Diabetes Mother   . Stroke Mother   . Cancer Father 50    Prostate  . Dementia Father   . COPD Sister   . Arthritis Sister   . Edema Sister    History  Substance Use Topics  . Smoking status: Passive Smoke Exposure - Never Smoker    Types: Pipe, Cigars  . Smokeless tobacco: Not on file  . Alcohol Use: No    Review of Systems Pertinent items in HPI    Objective:   Physical Exam BP 131/90  Pulse 71  Temp 98.4 F (36.9 C) (Oral)  Ht 5\' 9"  (1.753 m)  Wt 226 lb (102.513 kg)  BMI 33.37 kg/m2 General appearance: alert, cooperative  and no distress Lungs: clear to auscultation bilaterally Heart: regular rate and rhythm, S1, S2 normal, no murmur, click, rub or gallop Extremities: extremities normal, atraumatic, no cyanosis or edema Pulses: 2+ and symmetric      Assessment & Plan:

## 2012-06-22 NOTE — Assessment & Plan Note (Signed)
Continues to be well controlled, encouraged him to continue diet and exercise.

## 2012-06-26 ENCOUNTER — Telehealth: Payer: Self-pay | Admitting: Family Medicine

## 2012-06-26 ENCOUNTER — Encounter: Payer: Self-pay | Admitting: Family Medicine

## 2012-06-26 NOTE — Telephone Encounter (Signed)
Per Dr Lula Olszewski pt was told that there is not a percise  Amount for her to tell him,only to keep an eye on his urine.to make sure urine is light yellow in color.pt voiced understanding. Patrick [name], Virgel Bouquet

## 2012-06-26 NOTE — Telephone Encounter (Signed)
Patient is calling to find out how much fluid should he be taking in each day.  He doesn't want to have problems with dehydration.

## 2012-09-30 ENCOUNTER — Inpatient Hospital Stay (HOSPITAL_COMMUNITY)
Admission: EM | Admit: 2012-09-30 | Discharge: 2012-10-02 | DRG: 389 | Disposition: A | Discharge status: Home / Self Care | Payer: MEDICAID | Attending: Family Medicine | Admitting: Family Medicine

## 2012-09-30 ENCOUNTER — Emergency Department (HOSPITAL_COMMUNITY): Payer: MEDICAID

## 2012-09-30 ENCOUNTER — Encounter (HOSPITAL_COMMUNITY): Payer: Self-pay | Admitting: Family Medicine

## 2012-09-30 DIAGNOSIS — Z6833 Body mass index (BMI) 33.0-33.9, adult: Secondary | ICD-10-CM

## 2012-09-30 DIAGNOSIS — R011 Cardiac murmur, unspecified: Secondary | ICD-10-CM

## 2012-09-30 DIAGNOSIS — E860 Dehydration: Secondary | ICD-10-CM | POA: Diagnosis present

## 2012-09-30 DIAGNOSIS — H3530 Unspecified macular degeneration: Secondary | ICD-10-CM | POA: Diagnosis present

## 2012-09-30 DIAGNOSIS — K56609 Unspecified intestinal obstruction, unspecified as to partial versus complete obstruction: Secondary | ICD-10-CM

## 2012-09-30 DIAGNOSIS — I509 Heart failure, unspecified: Secondary | ICD-10-CM | POA: Diagnosis present

## 2012-09-30 DIAGNOSIS — K5000 Crohn's disease of small intestine without complications: Secondary | ICD-10-CM | POA: Diagnosis present

## 2012-09-30 DIAGNOSIS — Z79899 Other long term (current) drug therapy: Secondary | ICD-10-CM

## 2012-09-30 DIAGNOSIS — E039 Hypothyroidism, unspecified: Secondary | ICD-10-CM | POA: Diagnosis present

## 2012-09-30 DIAGNOSIS — F3289 Other specified depressive episodes: Secondary | ICD-10-CM | POA: Diagnosis present

## 2012-09-30 DIAGNOSIS — R109 Unspecified abdominal pain: Secondary | ICD-10-CM

## 2012-09-30 DIAGNOSIS — R112 Nausea with vomiting, unspecified: Secondary | ICD-10-CM

## 2012-09-30 DIAGNOSIS — E785 Hyperlipidemia, unspecified: Secondary | ICD-10-CM | POA: Diagnosis present

## 2012-09-30 DIAGNOSIS — I10 Essential (primary) hypertension: Secondary | ICD-10-CM

## 2012-09-30 DIAGNOSIS — D72829 Elevated white blood cell count, unspecified: Secondary | ICD-10-CM | POA: Diagnosis present

## 2012-09-30 DIAGNOSIS — E876 Hypokalemia: Secondary | ICD-10-CM | POA: Diagnosis present

## 2012-09-30 DIAGNOSIS — E669 Obesity, unspecified: Secondary | ICD-10-CM | POA: Diagnosis present

## 2012-09-30 DIAGNOSIS — F329 Major depressive disorder, single episode, unspecified: Secondary | ICD-10-CM | POA: Diagnosis present

## 2012-09-30 DIAGNOSIS — E119 Type 2 diabetes mellitus without complications: Secondary | ICD-10-CM | POA: Diagnosis present

## 2012-09-30 HISTORY — DX: Hyperlipidemia, unspecified: E78.5

## 2012-09-30 LAB — COMPREHENSIVE METABOLIC PANEL
ALT: 16 U/L (ref 0–53)
AST: 21 U/L (ref 0–37)
Albumin: 4.1 g/dL (ref 3.5–5.2)
Alkaline Phosphatase: 80 U/L (ref 39–117)
BUN: 18 mg/dL (ref 6–23)
CO2: 31 mEq/L (ref 19–32)
Calcium: 10 mg/dL (ref 8.4–10.5)
Chloride: 98 mEq/L (ref 96–112)
Creatinine, Ser: 0.95 mg/dL (ref 0.50–1.35)
GFR calc Af Amer: >90 mL/min (ref >90)
GFR calc non Af Amer: 86 mL/min — ABNORMAL LOW (ref >90)
Glucose, Bld: 168 mg/dL — ABNORMAL HIGH (ref 70–99)
Potassium: 3.1 mEq/L — ABNORMAL LOW (ref 3.5–5.1)
Sodium: 142 mEq/L (ref 135–145)
Total Bilirubin: 0.8 mg/dL (ref 0.3–1.2)
Total Protein: 7.6 g/dL (ref 6.0–8.3)

## 2012-09-30 LAB — URINALYSIS, ROUTINE W REFLEX MICROSCOPIC
Bilirubin Urine: NEGATIVE
Glucose, UA: NEGATIVE mg/dL
Hgb urine dipstick: NEGATIVE
Ketones, ur: NEGATIVE mg/dL
Leukocytes, UA: NEGATIVE
Nitrite: NEGATIVE
Protein, ur: 100 mg/dL — AB
Specific Gravity, Urine: >1.046 — ABNORMAL HIGH (ref 1.005–1.030)
Urobilinogen, UA: 0.2 mg/dL (ref 0.0–1.0)
pH: 5.5 (ref 5.0–8.0)

## 2012-09-30 LAB — CBC WITH DIFFERENTIAL/PLATELET
Basophils Absolute: 0 10*3/uL (ref 0.0–0.1)
Basophils Relative: 0 % (ref 0–1)
Eosinophils Absolute: 0.1 10*3/uL (ref 0.0–0.7)
Eosinophils Relative: 0 % (ref 0–5)
HCT: 46.6 % (ref 39.0–52.0)
Hemoglobin: 17.7 g/dL — ABNORMAL HIGH (ref 13.0–17.0)
Lymphocytes Relative: 8 % — ABNORMAL LOW (ref 12–46)
Lymphs Abs: 1.2 10*3/uL (ref 0.7–4.0)
MCH: 30.1 pg (ref 26.0–34.0)
MCHC: 38 g/dL — ABNORMAL HIGH (ref 30.0–36.0)
MCV: 79.1 fL (ref 78.0–100.0)
Monocytes Absolute: 0.9 10*3/uL (ref 0.1–1.0)
Monocytes Relative: 6 % (ref 3–12)
Neutro Abs: 12.2 10*3/uL — ABNORMAL HIGH (ref 1.7–7.7)
Neutrophils Relative %: 85 % — ABNORMAL HIGH (ref 43–77)
Platelets: 144 10*3/uL — ABNORMAL LOW (ref 150–400)
RBC: 5.89 MIL/uL — ABNORMAL HIGH (ref 4.22–5.81)
RDW: 14.4 % (ref 11.5–15.5)
WBC: 14.4 10*3/uL — ABNORMAL HIGH (ref 4.0–10.5)

## 2012-09-30 LAB — URINE MICROSCOPIC-ADD ON

## 2012-09-30 LAB — CBC
HCT: 48.5 % (ref 39.0–52.0)
Hemoglobin: 18 g/dL — ABNORMAL HIGH (ref 13.0–17.0)
MCV: 80.7 fL (ref 78.0–100.0)
RDW: 14.8 % (ref 11.5–15.5)
WBC: 14.4 10*3/uL — ABNORMAL HIGH (ref 4.0–10.5)

## 2012-09-30 LAB — CREATININE, SERUM: GFR calc Af Amer: 76 mL/min — ABNORMAL LOW (ref >90)

## 2012-09-30 LAB — TSH: TSH: 1.16 u[IU]/mL (ref 0.350–4.500)

## 2012-09-30 LAB — LIPASE, BLOOD: Lipase: 31 U/L (ref 11–59)

## 2012-09-30 MED ORDER — IOHEXOL 300 MG/ML  SOLN
100.0000 mL | Freq: Once | INTRAMUSCULAR | Status: AC | PRN
  Administered 2012-09-30: 100 mL via INTRAVENOUS

## 2012-09-30 MED ORDER — HYDRALAZINE HCL 20 MG/ML IJ SOLN
5.0000 mg | Freq: Four times a day (QID) | INTRAMUSCULAR | Status: DC | PRN
  Administered 2012-10-01: 5 mg via INTRAVENOUS
  Filled 2012-09-30: qty 1

## 2012-09-30 MED ORDER — SODIUM CHLORIDE 0.9 % IV SOLN
INTRAVENOUS | Status: DC
  Administered 2012-09-30 – 2012-10-01 (×2): via INTRAVENOUS

## 2012-09-30 MED ORDER — METRONIDAZOLE IN NACL 5-0.79 MG/ML-% IV SOLN
500.0000 mg | Freq: Three times a day (TID) | INTRAVENOUS | Status: DC
  Administered 2012-09-30 – 2012-10-01 (×3): 500 mg via INTRAVENOUS
  Filled 2012-09-30 (×5): qty 100

## 2012-09-30 MED ORDER — MORPHINE SULFATE 4 MG/ML IJ SOLN
6.0000 mg | Freq: Once | INTRAMUSCULAR | Status: AC
  Administered 2012-09-30: 6 mg via INTRAVENOUS
  Filled 2012-09-30: qty 2

## 2012-09-30 MED ORDER — MORPHINE SULFATE 2 MG/ML IJ SOLN: 1.0000 mg | INTRAMUSCULAR | Status: DC | PRN

## 2012-09-30 MED ORDER — CIPROFLOXACIN IN D5W 400 MG/200ML IV SOLN
400.0000 mg | Freq: Two times a day (BID) | INTRAVENOUS | Status: DC
  Administered 2012-09-30 – 2012-10-01 (×2): 400 mg via INTRAVENOUS
  Filled 2012-09-30 (×3): qty 200

## 2012-09-30 MED ORDER — HEPARIN SODIUM (PORCINE) 5000 UNIT/ML IJ SOLN
5000.0000 [IU] | Freq: Three times a day (TID) | INTRAMUSCULAR | Status: DC
  Administered 2012-09-30 – 2012-10-02 (×6): 5000 [IU] via SUBCUTANEOUS
  Filled 2012-09-30 (×10): qty 1

## 2012-09-30 MED ORDER — SODIUM CHLORIDE 0.9 % IJ SOLN
3.0000 mL | Freq: Two times a day (BID) | INTRAMUSCULAR | Status: DC
  Administered 2012-10-01 – 2012-10-02 (×3): 3 mL via INTRAVENOUS

## 2012-09-30 MED ORDER — IOHEXOL 300 MG/ML  SOLN
50.0000 mL | Freq: Once | INTRAMUSCULAR | Status: AC | PRN
  Administered 2012-09-30: 50 mL via ORAL

## 2012-09-30 MED ORDER — ONDANSETRON HCL 4 MG/2ML IJ SOLN: 4.0000 mg | Freq: Four times a day (QID) | INTRAMUSCULAR | Status: DC | PRN

## 2012-09-30 MED ORDER — POTASSIUM CHLORIDE 10 MEQ/100ML IV SOLN
10.0000 meq | INTRAVENOUS | Status: AC
Start: 2012-09-30 — End: 2012-09-30
  Administered 2012-09-30 (×4): 10 meq via INTRAVENOUS
  Filled 2012-09-30 (×4): qty 100

## 2012-09-30 MED ORDER — POTASSIUM CHLORIDE CRYS ER 20 MEQ PO TBCR
40.0000 meq | EXTENDED_RELEASE_TABLET | Freq: Once | ORAL | Status: DC
  Administered 2012-09-30: 40 meq via ORAL

## 2012-09-30 NOTE — ED Notes (Signed)
MD at bedside. 

## 2012-09-30 NOTE — ED Notes (Signed)
Pt finished drinking contrast. CT aware. 

## 2012-09-30 NOTE — ED Provider Notes (Signed)
History     CSN: [id number]  Arrival date & time 09/30/12  0555   First MD Initiated Contact with Patient 09/30/12 [phone]      Chief Complaint  Patient presents with  . Abdominal Pain    (Consider location/radiation/quality/duration/timing/severity/associated sxs/prior treatment) HPI Patient is 62 y.o white male with history of diverticular disease, HTN, DM, hyperlipidemia, CHF who presents to the Emergency department with abdominal pain.  Pain is peri-umbilical and right sided, constant, gnawing, and sharp.  Pain started around 8pm last night just prior to eating dinner and continued throughout the night without relief.  Patient reports having several "sour burps" and some nausea without vomiting.  He did not take any medications for this problem. Patient has never experienced this before.  He takes 800mg  ibuprofen every day, something three times/day.  Patient denies fever, diarrhea, constipation, dark tarry stools, bloody stools, chest pain, and shortness of breath.  Only abdominal surgery was for inguinal hernia repair.   Past Medical History  Diagnosis Date  . CHF (congestive heart failure)   . Hypertension   . DM II (diabetes mellitus, type II), controlled     Diet controlled.   . Hyperlipidemia     Past Surgical History  Procedure Laterality Date  . Hernia repair      Family History  Problem Relation Age of Onset  . Hypertension Mother   . Diabetes Mother   . Stroke Mother   . Cancer Father 46    Prostate  . Dementia Father   . COPD Sister   . Arthritis Sister   . Edema Sister     History  Substance Use Topics  . Smoking status: Passive Smoke Exposure - Never Smoker    Types: Pipe, Cigars  . Smokeless tobacco: Not on file  . Alcohol Use: No      Review of Systems Review of systems as above, otherwise negative Allergies  Review of patient's allergies indicates no known allergies.  Home Medications   Current Outpatient Rx  Name  Route  Sig  Dispense   Refill  . Ascorbic Acid (VITAMIN C) 1000 MG tablet   Oral   Take 2,000 mg by mouth 2 (two) times daily.         Marland Kitchen aspirin 325 MG tablet   Oral   Take 1 tablet (325 mg total) by mouth daily.   30 tablet   2   . carvedilol (COREG) 25 MG tablet   Oral   Take 1 tablet (25 mg total) by mouth 2 (two) times daily with a meal.   60 tablet   5   . finasteride (PROSCAR) 5 MG tablet   Oral   Take 1 tablet (5 mg total) by mouth daily.   30 tablet   5   . furosemide (LASIX) 40 MG tablet   Oral   Take 1 tablet (40 mg total) by mouth daily.   30 tablet   5   . ibuprofen (ADVIL,MOTRIN) 800 MG tablet   Oral   Take 1 tablet (800 mg total) by mouth every 8 (eight) hours as needed. For pain   30 tablet   0   . lisinopril (PRINIVIL,ZESTRIL) 20 MG tablet   Oral   Take 1 tablet (20 mg total) by mouth daily.   30 tablet   5   . lovastatin (MEVACOR) 20 MG tablet   Oral   Take 1 tablet (20 mg total) by mouth at bedtime.   30 tablet  5   . Multiple Vitamins-Minerals (MULTIVITAMIN WITH MINERALS) tablet   Oral   Take 1 tablet by mouth daily.         Marland Kitchen omega-3 acid ethyl esters (LOVAZA) 1 G capsule   Oral   Take 2 capsules (2 g total) by mouth 2 (two) times daily.   120 capsule   2   . potassium chloride (K-DUR) 10 MEQ tablet   Oral   Take 2 tablets (20 mEq total) by mouth daily.   60 tablet   2   . traZODone (DESYREL) 150 MG tablet   Oral   Take 1 tablet (150 mg total) by mouth at bedtime.   30 tablet   5   . triamterene-hydrochlorothiazide (MAXZIDE) 75-50 MG per tablet   Oral   Take 0.5 tablets by mouth daily.   15 tablet   5     BP 186/99  Pulse 90  Temp(Src) 97.9 F (36.6 C) (Oral)  Resp 20  SpO2 97%  Physical Exam  Constitutional: He is oriented to person, place, and time. He appears well-developed and well-nourished. No distress.  HENT:  Head: Normocephalic and atraumatic.  Eyes: Conjunctivae are normal. Pupils are equal, round, and reactive to  light.  Cardiovascular: Normal rate, regular rhythm, normal heart sounds and intact distal pulses.  Exam reveals no gallop and no friction rub.   No murmur heard. Pulmonary/Chest: Effort normal and breath sounds normal. No respiratory distress. He has no wheezes. He has no rales. He exhibits no tenderness.  Abdominal: Normal appearance and bowel sounds are normal. He exhibits no distension. There is no hepatosplenomegaly. There is tenderness in the right upper quadrant, right lower quadrant and periumbilical area. There is no rigidity, no rebound, no guarding, no tenderness at McBurney's point and negative Murphy's sign.    Musculoskeletal: He exhibits no edema.  Neurological: He is alert and oriented to person, place, and time.  Skin: Skin is warm and dry.    ED Course  Procedures (including critical care time)  Labs Reviewed  CBC WITH DIFFERENTIAL  COMPREHENSIVE METABOLIC PANEL  LIPASE, BLOOD  URINALYSIS, ROUTINE W REFLEX MICROSCOPIC   I spoke with general surgery, and the family practice resident about the patient and they will come down to see the patient for admission.  Patient is given the results of his tests and all questions were answered.   MDM  MDM Reviewed: nursing note, vitals and previous chart Interpretation: labs and CT scan Consults: admitting MD and general surgery            Carlyle Dolly, PA-C 09/30/12 1139

## 2012-09-30 NOTE — H&P (Signed)
Family Medicine Teaching [facility] Admission History and Physical Service Pager: [phone]  Patient name: Patrick [name] Medical record number: [id number] Date of birth: 1951-01-18 Age: 62 y.o. Gender: male  Primary Care Provider: Ardyth Gal, MD  Chief Complaint: Abdominal pain  Assessment and Plan: Patrick [name] is a 62 y.o. year old male with PMH of diet-controlled DM-2, HTN, CHF and HLD who presents with abdominal pain.  CT scan revealed SBO and evidence of terminal ileitis.   # Small bowel obstruction and terminal ileitis - Unlear etiology at this time.  Patient does have history of hernia surgery.  He does not report any history of diarrhea or bloody stools that would be consistent with history of IBD. - Will admit to inpatient, Attending Dr. Mauricio Po - General surgery to evaluate patient today. - Will continue NPO status and NG tube for decompression. Gentle IV hydration in the setting of Hx of CHF - NS @ 75 mL/hr. - Given leukocytosis and evidence of terminal ileitis on CT scan, will start empiric abx - Cipro/flagyl. - Will monitor closely with serial abdominal exams.   # Dehydration  - IV fluids as above  # Hx of CHF - Last Echo (2009) revealed slightly decreased EF of 45% - Will administer IV fluids as above in the setting of dehydration.   - Will monitor volume status closely. Daily weights and Strict I/O's.  # DM-2 - Diet controlled.  Last A1C was 5.4 in January, 2014. - Will not monitor CBG's or start insulin therapy.  # HTN - As patient is currently NPO and SBP was ~120's earlier, will hold PO antihypertensives at this time. - PRN IV Hydralazine 5 mg for SBP >160 or DBP >100.  FEN/GI: IV fluids as above.  NPO Prophylaxis: Heparin SQ Disposition: Pending clinical improvement. Code Status: Full Code  History of Present Illness: Patrick [name] is a 62 y.o. year old male with PMH of diet-controlled DM-2, CHF, HTN, and HLD who presents with abdominal pain.  Patient  reports that last night at approximately 7 pm he developed acute, severe abdominal pain located in the epigastric and periumbilical regions.  He reports some associated nausea, but no vomiting or diarrhea.  He also denies fever, chills, SOB, chest pain.  The pain persisted and worsened throughout the night prompting him to go to the ED this morning for evaluation.  In the ED, basic laboratory work up revealed leukocytosis with left shift (WBC 14.4), hemoconcentration with Hb of 17.7, and hypokalemia. LFT's, Alk phos, and lipase were WNL.  CT abdomen and pelvis was obtained and revealed distal small bowel obstruction extending to the terminal ileum in the right lower quadrant.  It also revealed findings suspicious for terminal ileitis.    Patient Active Problem List   Diagnosis Date Noted  . KNEE PAIN, RIGHT 01/26/2010  . CARDIAC MURMUR 01/26/2010  . MICROSCOPIC HEMATURIA 12/26/2008  . MACULAR DEGENERATION 05/02/2008  . INSOMNIA 10/02/2007  . HYPOTHYROIDISM 08/21/2007  . SMALL BOWEL OBSTRUCTION, HX OF 08/21/2007  . ERECTILE DYSFUNCTION 03/13/2007  . DEPRESSION 02/06/2007  . Diet-controlled type 2 diabetes mellitus 01/16/2007  . DYSLIPIDEMIA 01/16/2007  . HYPERTENSION 01/16/2007  . CONGESTIVE HEART FAILURE 01/16/2007  . DIVERTICULAR DISEASE 01/16/2007  . CHOLELITHIASIS 01/16/2007  . BENIGN PROSTATIC HYPERTROPHY, HX OF 01/16/2007   Past Medical History: Past Medical History  Diagnosis Date  . CHF (congestive heart failure)   . Hypertension   . DM II (diabetes mellitus, type II), controlled     Diet  controlled.   . Hyperlipidemia    Past Surgical History: Past Surgical History  Procedure Laterality Date  . Hernia repair     Social History: History  Substance Use Topics  . Smoking status: Passive Smoke Exposure - Never Smoker    Types: Pipe, Cigars  . Smokeless tobacco: Not on file  . Alcohol Use: No    Family History: Family History  Problem Relation Age of Onset  .  Hypertension Mother   . Diabetes Mother   . Stroke Mother   . Cancer Father 73    Prostate  . Dementia Father   . COPD Sister   . Arthritis Sister   . Edema Sister    Allergies: No Known Allergies No current facility-administered medications on file prior to encounter.   Current Outpatient Prescriptions on File Prior to Encounter  Medication Sig Dispense Refill  . Ascorbic Acid (VITAMIN C) 1000 MG tablet Take 2,000 mg by mouth 2 (two) times daily.      Marland Kitchen aspirin 325 MG tablet Take 1 tablet (325 mg total) by mouth daily.  30 tablet  2  . carvedilol (COREG) 25 MG tablet Take 1 tablet (25 mg total) by mouth 2 (two) times daily with a meal.  60 tablet  5  . finasteride (PROSCAR) 5 MG tablet Take 1 tablet (5 mg total) by mouth daily.  30 tablet  5  . furosemide (LASIX) 40 MG tablet Take 1 tablet (40 mg total) by mouth daily.  30 tablet  5  . lisinopril (PRINIVIL,ZESTRIL) 20 MG tablet Take 1 tablet (20 mg total) by mouth daily.  30 tablet  5  . lovastatin (MEVACOR) 20 MG tablet Take 1 tablet (20 mg total) by mouth at bedtime.  30 tablet  5  . Multiple Vitamins-Minerals (MULTIVITAMIN WITH MINERALS) tablet Take 1 tablet by mouth daily.      . traZODone (DESYREL) 150 MG tablet Take 1 tablet (150 mg total) by mouth at bedtime.  30 tablet  5  . triamterene-hydrochlorothiazide (MAXZIDE) 75-50 MG per tablet Take 0.5 tablets by mouth daily.  15 tablet  5   Review Of Systems: Per HPI. Otherwise 12 point review of systems was performed and was unremarkable.  Physical Exam: BP 186/99  Pulse 90  Temp(Src) 97.9 F (36.6 C) (Oral)  Resp 20  SpO2 97% Exam: General: patient sitting up in Bed; NG tube in place.  Appears comfortable and in NAD. HEENT: NCAT. No scleral icterus.  Cardiovascular: RRR. No murmurs, rubs, or gallops. Respiratory: CTAB. No rales, rhonchi, or wheezing. Abdomen: obese, slightly distended.  Tender to palpation in the epigastric region. Normoactive BS present.  Extremities:  warm, well perfused.  No LE edema noted. Skin: warm, dry, intact. Neuro: AO x 3. No focal deficits.  Labs and Imaging: CBC BMET   Recent Labs Lab 09/30/12 0634  WBC 14.4*  HGB 17.7*  HCT 46.6  PLT 144*    Recent Labs Lab 09/30/12 0634  NA 142  K 3.1*  CL 98  CO2 31  BUN 18  CREATININE 0.95  GLUCOSE 168*  CALCIUM 10.0     Ct Abdomen Pelvis W Contrast 09/30/2012  *RADIOLOGY REPORT*  Clinical Data: Lower abdominal and pelvic pain, nausea, diaphoresis  CT ABDOMEN AND PELVIS WITH CONTRAST  Technique:  Multidetector CT imaging of the abdomen and pelvis was performed following the standard protocol during bolus administration of intravenous contrast.  Contrast: OMNIPAQUE IOHEXOL 300 MG/ML  SOLN, 50mL OMNIPAQUE IOHEXOL 300 MG/ML  SOLN  Comparison: 08/22/2007  Findings: The lung bases are clear.  No pericardial or pleural effusion.  Mild cardiac enlargement evident.  No hiatal hernia.  Abdomen:  Dependent layering calcified gallstones as before.  No biliary dilatation or obstruction.  Liver, biliary system, pancreas, spleen, and right adrenal gland are within normal limits and demonstrate no acute process.  Left adrenal gland demonstrates a stable 10 mm nodule, image 37 compatible with an adenoma.  Small amount of left upper quadrant perisplenic free fluid, image 20.  Diffuse distention of the stomach and entire small bowel.  Terminal ileum demonstrates mild wall thickening, images 71 through 83. Minor terminal ileal inflammatory stranding noted.  Adjacent right lower quadrant free fluid, image 83.  Appearance is concerning for terminal ileitis possibly from inflammatory bowel disease with an associated distal small bowel obstruction.  No free air or pneumatosis evident.  Colon is decompressed. Chronic diverticulosis of the sigmoid noted without surrounding inflammation.  No abdominal fluid collection, hemorrhage, abscess, or adenopathy.  Kidneys demonstrate small cortical cyst measuring 10  mm less in size.  Normal renal excretion.  No hydronephrosis or obstruction.  Atherosclerosis of the aorta without aneurysm or occlusive process.  Pelvis:  Small amount pelvic free fluid.  Sigmoid diverticulosis again evident.  Urinary bladder unremarkable.  Prostate gland is enlarged.  No pelvic abscess, hemorrhage, adenopathy, or inguinal abnormality.  No hernia.  Diffuse degenerative changes of the spine, pelvis and hips.  Advanced degenerative changes noted of the right hip as before.  IMPRESSION: Distal small bowel obstruction extending to the terminal ileum in the right lower quadrant.  Terminal ileum demonstrates wall thickening with mucosal enhancement and adjacent mild inflammatory stranding and free fluid suspicious for terminal ileitis.  No free air or abscess  Cholelithiasis  Diverticulosis  Critical Value/emergent results were called by telephone at the time of interpretation on 09/30/2012 at 8:35 a.m. to Dr. Otila Kluver, who verbally acknowledged these results.   Everlene Other, DO 09/30/2012, 10:16 AM  I have evaluated that patient and reviewed Dr. Patsey Berthold note. I agree with his assessment and plan.   Si Raider Clinton Sawyer, MD, MBA 09/30/2012, 5:09 PM Family Medicine Resident, PGY-2

## 2012-09-30 NOTE — ED Notes (Signed)
Pt return from CT.

## 2012-09-30 NOTE — H&P (Signed)
[facility] Attending Admit Note Patient seen and examined by me in ED, case discussed with resident team and I agree with plan as per H&P.  Patient with SBO by CT abdomen; unclear etiology.  Terminal ileitis also noted on scan.  Has NGT already placed and feeling better after decompression. For surgery consult.  To start abx.  Bowel rest.  Paula Compton, MD

## 2012-09-30 NOTE — Consult Note (Signed)
Reason for Consult:Possible Bowel obstruction Referring Physician: Tibor [name] is an 62 y.o. male.  HPI: Abdominal pain started first with subsequent nausea and vomiting.  Has multiple medical problems.  Pain has since original consultation resolve.  He has had a large bowel movement.  He is passing gass; however, he did hav > 2.0 L of drainage form his NGT prior to being clamped by the admitting service  Past Medical History  Diagnosis Date  . CHF (congestive heart failure)   . Hypertension   . DM II (diabetes mellitus, type II), controlled     Diet controlled.   . Hyperlipidemia     Past Surgical History  Procedure Laterality Date  . Hernia repair      Family History  Problem Relation Age of Onset  . Hypertension Mother   . Diabetes Mother   . Stroke Mother   . Cancer Father 85    Prostate  . Dementia Father   . COPD Sister   . Arthritis Sister   . Edema Sister     Social History:  reports that he has been passively smoking Pipe and Cigars.  He does not have any smokeless tobacco history on file. He reports that he does not drink alcohol or use illicit drugs.  Allergies: No Known Allergies  Medications: I have reviewed the patient's current medications.  Results for orders placed during the hospital encounter of 09/30/12 (from the past 48 hour(s))  CBC WITH DIFFERENTIAL     Status: Abnormal   Collection Time    09/30/12  6:34 AM      Result Value Range   WBC 14.4 (*) 4.0 - 10.5 K/uL   RBC 5.89 (*) 4.22 - 5.81 MIL/uL   Hemoglobin 17.7 (*) 13.0 - 17.0 g/dL   HCT 16.1  09.6 - 04.5 %   MCV 79.1  78.0 - 100.0 fL   MCH 30.1  26.0 - 34.0 pg   MCHC 38.0 (*) 30.0 - 36.0 g/dL   Comment: RULED OUT INTERFERING SUBSTANCES   RDW 14.4  11.5 - 15.5 %   Platelets 144 (*) 150 - 400 K/uL   Neutrophils Relative 85 (*) 43 - 77 %   Neutro Abs 12.2 (*) 1.7 - 7.7 K/uL   Lymphocytes Relative 8 (*) 12 - 46 %   Lymphs Abs 1.2  0.7 - 4.0 K/uL   Monocytes Relative 6  3 - 12 %    Monocytes Absolute 0.9  0.1 - 1.0 K/uL   Eosinophils Relative 0  0 - 5 %   Eosinophils Absolute 0.1  0.0 - 0.7 K/uL   Basophils Relative 0  0 - 1 %   Basophils Absolute 0.0  0.0 - 0.1 K/uL  COMPREHENSIVE METABOLIC PANEL     Status: Abnormal   Collection Time    09/30/12  6:34 AM      Result Value Range   Sodium 142  135 - 145 mEq/L   Potassium 3.1 (*) 3.5 - 5.1 mEq/L   Chloride 98  96 - 112 mEq/L   CO2 31  19 - 32 mEq/L   Glucose, Bld 168 (*) 70 - 99 mg/dL   BUN 18  6 - 23 mg/dL   Creatinine, Ser 4.09  0.50 - 1.35 mg/dL   Calcium 81.1  8.4 - 91.4 mg/dL   Total Protein 7.6  6.0 - 8.3 g/dL   Albumin 4.1  3.5 - 5.2 g/dL   AST 21  0 - 37 U/L  ALT 16  0 - 53 U/L   Alkaline Phosphatase 80  39 - 117 U/L   Total Bilirubin 0.8  0.3 - 1.2 mg/dL   GFR calc non Af Amer 86 (*) >90 mL/min   GFR calc Af Amer >90  >90 mL/min   Comment:            The eGFR has been calculated     using the CKD EPI equation.     This calculation has not been     validated in all clinical     situations.     eGFR's persistently     <90 mL/min signify     possible Chronic Kidney Disease.  LIPASE, BLOOD     Status: None   Collection Time    09/30/12  6:34 AM      Result Value Range   Lipase 31  11 - 59 U/L  CBC     Status: Abnormal   Collection Time    09/30/12  2:35 PM      Result Value Range   WBC 14.4 (*) 4.0 - 10.5 K/uL   RBC 6.01 (*) 4.22 - 5.81 MIL/uL   Hemoglobin 18.0 (*) 13.0 - 17.0 g/dL   HCT 16.1  09.6 - 04.5 %   MCV 80.7  78.0 - 100.0 fL   MCH 30.0  26.0 - 34.0 pg   MCHC 37.1 (*) 30.0 - 36.0 g/dL   Comment: RULED OUT INTERFERING SUBSTANCES   RDW 14.8  11.5 - 15.5 %   Platelets 157  150 - 400 K/uL  CREATININE, SERUM     Status: Abnormal   Collection Time    09/30/12  2:35 PM      Result Value Range   Creatinine, Ser 1.15  0.50 - 1.35 mg/dL   GFR calc non Af Amer 65 (*) >90 mL/min   GFR calc Af Amer 76 (*) >90 mL/min   Comment:            The eGFR has been calculated     using  the CKD EPI equation.     This calculation has not been     validated in all clinical     situations.     eGFR's persistently     <90 mL/min signify     possible Chronic Kidney Disease.  URINALYSIS, ROUTINE W REFLEX MICROSCOPIC     Status: Abnormal   Collection Time    09/30/12  4:33 PM      Result Value Range   Color, Urine YELLOW  YELLOW   APPearance CLEAR  CLEAR   Specific Gravity, Urine >1.046 (*) 1.005 - 1.030   pH 5.5  5.0 - 8.0   Glucose, UA NEGATIVE  NEGATIVE mg/dL   Hgb urine dipstick NEGATIVE  NEGATIVE   Bilirubin Urine NEGATIVE  NEGATIVE   Ketones, ur NEGATIVE  NEGATIVE mg/dL   Protein, ur 409 (*) NEGATIVE mg/dL   Urobilinogen, UA 0.2  0.0 - 1.0 mg/dL   Nitrite NEGATIVE  NEGATIVE   Leukocytes, UA NEGATIVE  NEGATIVE  URINE MICROSCOPIC-ADD ON     Status: None   Collection Time    09/30/12  4:33 PM      Result Value Range   WBC, UA 0-2  <3 WBC/hpf    Ct Abdomen Pelvis W Contrast  09/30/2012  *RADIOLOGY REPORT*  Clinical Data: Lower abdominal and pelvic pain, nausea, diaphoresis  CT ABDOMEN AND PELVIS WITH CONTRAST  Technique:  Multidetector CT imaging of  the abdomen and pelvis was performed following the standard protocol during bolus administration of intravenous contrast.  Contrast: OMNIPAQUE IOHEXOL 300 MG/ML  SOLN, 50mL OMNIPAQUE IOHEXOL 300 MG/ML  SOLN  Comparison: 08/22/2007  Findings: The lung bases are clear.  No pericardial or pleural effusion.  Mild cardiac enlargement evident.  No hiatal hernia.  Abdomen:  Dependent layering calcified gallstones as before.  No biliary dilatation or obstruction.  Liver, biliary system, pancreas, spleen, and right adrenal gland are within normal limits and demonstrate no acute process.  Left adrenal gland demonstrates a stable 10 mm nodule, image 37 compatible with an adenoma.  Small amount of left upper quadrant perisplenic free fluid, image 20.  Diffuse distention of the stomach and entire small bowel.  Terminal ileum  demonstrates mild wall thickening, images 71 through 83. Minor terminal ileal inflammatory stranding noted.  Adjacent right lower quadrant free fluid, image 83.  Appearance is concerning for terminal ileitis possibly from inflammatory bowel disease with an associated distal small bowel obstruction.  No free air or pneumatosis evident.  Colon is decompressed. Chronic diverticulosis of the sigmoid noted without surrounding inflammation.  No abdominal fluid collection, hemorrhage, abscess, or adenopathy.  Kidneys demonstrate small cortical cyst measuring 10 mm less in size.  Normal renal excretion.  No hydronephrosis or obstruction.  Atherosclerosis of the aorta without aneurysm or occlusive process.  Pelvis:  Small amount pelvic free fluid.  Sigmoid diverticulosis again evident.  Urinary bladder unremarkable.  Prostate gland is enlarged.  No pelvic abscess, hemorrhage, adenopathy, or inguinal abnormality.  No hernia.  Diffuse degenerative changes of the spine, pelvis and hips.  Advanced degenerative changes noted of the right hip as before.  IMPRESSION: Distal small bowel obstruction extending to the terminal ileum in the right lower quadrant.  Terminal ileum demonstrates wall thickening with mucosal enhancement and adjacent mild inflammatory stranding and free fluid suspicious for terminal ileitis.  No free air or abscess  Cholelithiasis  Diverticulosis  Critical Value/emergent results were called by telephone at the time of interpretation on 09/30/2012 at 8:35 a.m. to Dr. Otila Kluver, who verbally acknowledged these results.   Original Report Authenticated By: Judie Petit. Miles Costain, M.D.     Review of Systems  Constitutional: Negative.   HENT: Negative.   Eyes: Negative.   Respiratory: Negative.   Cardiovascular: Negative.   Gastrointestinal: Positive for abdominal pain (started earlier today, before nausea or vomiting).  Genitourinary: Negative.   Musculoskeletal: Negative.   Skin: Negative.   Neurological: Negative.    Endo/Heme/Allergies: Negative.   Psychiatric/Behavioral: Negative.   All other systems reviewed and are negative.   Blood pressure 145/97, pulse 96, temperature 97.8 F (36.6 C), temperature source Oral, resp. rate 20, height 5\' 9"  (1.753 m), weight 102.967 kg (227 lb), SpO2 96.00%. Physical Exam  Constitutional: He is oriented to person, place, and time. He appears well-developed and well-nourished.  HENT:  Head: Normocephalic and atraumatic.  Eyes: Conjunctivae and EOM are normal. Pupils are equal, round, and reactive to light.  Neck: Normal range of motion. Neck supple.  Cardiovascular: Normal rate, regular rhythm and normal heart sounds.   Respiratory: Effort normal and breath sounds normal.  GI: Soft. He exhibits no distension. There is no tenderness. There is no rebound and no guarding.  Musculoskeletal: Normal range of motion.  Neurological: He is alert and oriented to person, place, and time. He has normal reflexes.  Skin: Skin is warm and dry.  Psychiatric: He has a normal mood and affect. His behavior is  normal. Judgment and thought content normal.    Assessment/Plan: Patient admitted for possible SBO.  I have reviewed his CT and think that this is more consistent with an ileus versus an SBO, but he did drain a significant amount from his NGT.  I would recommend keeping the patient on suction for at least a day prior to clamping or removal.  If he continues to improve by tomorrow morning with BMs and flatus, would DC NGT and start clear liquids.  I will restart the NGT suction.  Cherylynn Ridges 09/30/2012, 6:10 PM

## 2012-09-30 NOTE — Progress Notes (Signed)
Interim Progress Note  Family Medicine Resident Pager [phone]  Patient name: Patrick [name] Medical record number: [id number] Date of birth: Aug 12, 1950 Age: 62 y.o. Gender: male  Overview: 62 year old M with SBO.   S: Patient notes rapid improvement in abdominal pain and distention since this morning, denies current nausea and vomiting, had 1 bowel movement today with formed stool  O: BP 145/97  Pulse 96  Temp(Src) 97.8 F (36.6 C) (Oral)  Resp 20  Ht 5\' 9"  (1.753 m)  Wt 227 lb (102.967 kg)  BMI 33.51 kg/m2  SpO2 96% Gen: well appearing, pleasant and conversant Abd: mild distention, non tender, hypoactive bowel sounds  A/P:  Improving SBO - Clamp NG tube - Allow PO water and ice - Cont antibiotics  Si Raider. Clinton Sawyer, MD, MBA 09/30/2012, 5:12 PM Family Medicine Resident, PGY-2

## 2012-09-30 NOTE — ED Provider Notes (Signed)
Medical screening examination/treatment/procedure(s) were performed by non-physician practitioner and as supervising physician I was immediately available for consultation/collaboration.   Celene Kras, MD 09/30/12 [phone]

## 2012-09-30 NOTE — ED Notes (Signed)
Patient transported to CT 

## 2012-09-30 NOTE — ED Notes (Signed)
PT c/o intermittent central abdominal pain that began at approx 1900 last night. Pt c/o nausea. Denies V/D, SOB, CP, or Chills .

## 2012-10-01 DIAGNOSIS — E119 Type 2 diabetes mellitus without complications: Secondary | ICD-10-CM

## 2012-10-01 DIAGNOSIS — I10 Essential (primary) hypertension: Secondary | ICD-10-CM

## 2012-10-01 LAB — CBC
HCT: 44.6 % (ref 39.0–52.0)
Hemoglobin: 15.8 g/dL (ref 13.0–17.0)
MCV: 82.1 fL (ref 78.0–100.0)
RBC: 5.43 MIL/uL (ref 4.22–5.81)
WBC: 11.3 10*3/uL — ABNORMAL HIGH (ref 4.0–10.5)

## 2012-10-01 LAB — BASIC METABOLIC PANEL
CO2: 35 mEq/L — ABNORMAL HIGH (ref 19–32)
Chloride: 99 mEq/L (ref 96–112)

## 2012-10-01 MED ORDER — METRONIDAZOLE 500 MG PO TABS
500.0000 mg | ORAL_TABLET | Freq: Three times a day (TID) | ORAL | Status: DC
  Administered 2012-10-01 – 2012-10-02 (×4): 500 mg via ORAL
  Filled 2012-10-01 (×6): qty 1

## 2012-10-01 MED ORDER — TRIAMTERENE-HCTZ 37.5-25 MG PO TABS
1.0000 | ORAL_TABLET | Freq: Every day | ORAL | Status: DC
  Administered 2012-10-01 – 2012-10-02 (×2): 1 via ORAL
  Filled 2012-10-01 (×2): qty 1

## 2012-10-01 MED ORDER — CARVEDILOL 25 MG PO TABS
25.0000 mg | ORAL_TABLET | Freq: Two times a day (BID) | ORAL | Status: DC
  Administered 2012-10-01 – 2012-10-02 (×3): 25 mg via ORAL
  Filled 2012-10-01 (×5): qty 1

## 2012-10-01 MED ORDER — POTASSIUM CHLORIDE CRYS ER 20 MEQ PO TBCR
40.0000 meq | EXTENDED_RELEASE_TABLET | Freq: Three times a day (TID) | ORAL | Status: AC
  Administered 2012-10-01 (×3): 40 meq via ORAL
  Filled 2012-10-01 (×3): qty 2

## 2012-10-01 MED ORDER — CIPROFLOXACIN HCL 500 MG PO TABS
500.0000 mg | ORAL_TABLET | Freq: Two times a day (BID) | ORAL | Status: AC
  Administered 2012-10-01: 500 mg via ORAL
  Filled 2012-10-01: qty 1

## 2012-10-01 MED ORDER — CIPROFLOXACIN HCL 500 MG PO TABS
500.0000 mg | ORAL_TABLET | Freq: Two times a day (BID) | ORAL | Status: DC
  Administered 2012-10-02: 500 mg via ORAL
  Filled 2012-10-01 (×4): qty 1

## 2012-10-01 MED ORDER — TRIAMTERENE-HCTZ 75-50 MG PO TABS: 0.5000 | ORAL_TABLET | Freq: Every day | ORAL | Status: DC

## 2012-10-01 MED ORDER — ASPIRIN 325 MG PO TABS
325.0000 mg | ORAL_TABLET | Freq: Every day | ORAL | Status: DC
  Administered 2012-10-01 – 2012-10-02 (×2): 325 mg via ORAL
  Filled 2012-10-01 (×2): qty 1

## 2012-10-01 NOTE — Progress Notes (Signed)
D/C NGT per MD order.  Pt tolerated well.  Will  Continue to monitor.  Amanda Pea, Charity fundraiser.

## 2012-10-01 NOTE — Progress Notes (Signed)
Pt had 4 beats run of VT.  Pt asymptomatic.  BP157/83, P 83 Dr.Kuneff informed.  No new order given.  Amanda Pea, RN

## 2012-10-01 NOTE — Progress Notes (Signed)
PCP Brief Note:  S: Mr. Barley reports feeling much better, says "breakfast" never tasted as good as the broth and jello did this morning.  He reports improved abdominal pain.  No other complaints.  O: BP 147/90  Pulse 91  Temp(Src) 98.4 F (36.9 C) (Oral)  Resp 20  Ht 5\' 9"  (1.753 m)  Wt 220 lb (99.791 kg)  BMI 32.47 kg/m2  SpO2 96% Gen: Alert, no distress Abd: hypoactive bowel sounds, mild distention  Pulses: 2+ Extrem: No edema A/P:  62 year old male with SBO, improving, possible ileitis:  - Continue advancing diet as tolearated - continue cipro/flagyl.  - Thanks to FMTS and CCS for their input - Will plan on patient to follow up with me in clinic about 1 week after d/c  Falynn Ailey 10/01/2012 12:13 PM

## 2012-10-01 NOTE — Progress Notes (Signed)
FMTS Attending Admission Note: Kehinde Eniola,MD I  have seen and examined this patient, reviewed their chart. I have discussed this patient with the resident. I agree with the resident's findings, assessment and care plan.  Patient doing well this morning,having is soft diet,denies any abdominal pain,s/p NGT removal with some small bleed from his nose otherwise denies any concern. Continue to advance diet,consider d/c home tomorrow if remain stable.

## 2012-10-01 NOTE — Progress Notes (Signed)
Pt had some SVT, asymptomatic.  BP157/91, P 101.  Dr. Norina Buzzard informed.  No new order.  Will continue to monitor.  Amanda Pea, Charity fundraiser.

## 2012-10-01 NOTE — Progress Notes (Signed)
Patient ID: Patrick [name], male   DOB: Jun 07, 1950, 62 y.o.   MRN: [id number]    Subjective: Pt feels much better this morning.  Passed flatus and had 3 BMs yesterday.  Bloating and presenting symptoms have resolved.  Objective: Vital signs in last 24 hours: Temp:  [97.5 F (36.4 C)-99.2 F (37.3 C)] 98.4 F (36.9 C) (05/05 0602) Pulse Rate:  [90-96] 90 (05/05 0602) Resp:  [20] 20 (05/05 0602) BP: (145-189)/(88-97) 148/96 mmHg (05/05 0602) SpO2:  [95 %-96 %] 96 % (05/05 0602) Weight:  [220 lb (99.791 kg)-227 lb (102.967 kg)] 220 lb (99.791 kg) (05/05 0602) Last BM Date: 09/30/12  Intake/Output from previous day: 05/04 0701 - 05/05 0700 In: 880 [P.O.:180; IV Piggyback:700] Out: 4001 [Urine:600; Emesis/NG output:2600; Stool:1] Intake/Output this shift: Total I/O In: -  Out: 1000 [Emesis/NG output:1000]  PE: Abd: soft, minimally tender in RLQ, +BS, ND  Lab Results:   Recent Labs  09/30/12 1435 10/01/12 0555  WBC 14.4* 11.3*  HGB 18.0* 15.8  HCT 48.5 44.6  PLT 157 144*   BMET  Recent Labs  09/30/12 0634 09/30/12 1435 10/01/12 0555  NA 142  --  144  K 3.1*  --  2.8*  CL 98  --  99  CO2 31  --  35*  GLUCOSE 168*  --  138*  BUN 18  --  22  CREATININE 0.95 1.15 1.08  CALCIUM 10.0  --  8.8   PT/INR No results found for this basename: LABPROT, INR,  in the last 72 hours CMP     Component Value Date/Time   NA 144 10/01/2012 0555   K 2.8* 10/01/2012 0555   CL 99 10/01/2012 0555   CO2 35* 10/01/2012 0555   GLUCOSE 138* 10/01/2012 0555   BUN 22 10/01/2012 0555   CREATININE 1.08 10/01/2012 0555   CREATININE 0.98 06/22/2012 1417   CALCIUM 8.8 10/01/2012 0555   PROT 7.6 09/30/2012 0634   ALBUMIN 4.1 09/30/2012 0634   AST 21 09/30/2012 0634   ALT 16 09/30/2012 0634   ALKPHOS 80 09/30/2012 0634   BILITOT 0.8 09/30/2012 0634   GFRNONAA 71* 10/01/2012 0555   GFRAA 82* 10/01/2012 0555   Lipase     Component Value Date/Time   LIPASE 31 09/30/2012 0634       Studies/Results: Ct Abdomen  Pelvis W Contrast  09/30/2012  *RADIOLOGY REPORT*  Clinical Data: Lower abdominal and pelvic pain, nausea, diaphoresis  CT ABDOMEN AND PELVIS WITH CONTRAST  Technique:  Multidetector CT imaging of the abdomen and pelvis was performed following the standard protocol during bolus administration of intravenous contrast.  Contrast: OMNIPAQUE IOHEXOL 300 MG/ML  SOLN, 50mL OMNIPAQUE IOHEXOL 300 MG/ML  SOLN  Comparison: 08/22/2007  Findings: The lung bases are clear.  No pericardial or pleural effusion.  Mild cardiac enlargement evident.  No hiatal hernia.  Abdomen:  Dependent layering calcified gallstones as before.  No biliary dilatation or obstruction.  Liver, biliary system, pancreas, spleen, and right adrenal gland are within normal limits and demonstrate no acute process.  Left adrenal gland demonstrates a stable 10 mm nodule, image 37 compatible with an adenoma.  Small amount of left upper quadrant perisplenic free fluid, image 20.  Diffuse distention of the stomach and entire small bowel.  Terminal ileum demonstrates mild wall thickening, images 71 through 83. Minor terminal ileal inflammatory stranding noted.  Adjacent right lower quadrant free fluid, image 83.  Appearance is concerning for terminal ileitis possibly from inflammatory bowel  disease with an associated distal small bowel obstruction.  No free air or pneumatosis evident.  Colon is decompressed. Chronic diverticulosis of the sigmoid noted without surrounding inflammation.  No abdominal fluid collection, hemorrhage, abscess, or adenopathy.  Kidneys demonstrate small cortical cyst measuring 10 mm less in size.  Normal renal excretion.  No hydronephrosis or obstruction.  Atherosclerosis of the aorta without aneurysm or occlusive process.  Pelvis:  Small amount pelvic free fluid.  Sigmoid diverticulosis again evident.  Urinary bladder unremarkable.  Prostate gland is enlarged.  No pelvic abscess, hemorrhage, adenopathy, or inguinal abnormality.  No  hernia.  Diffuse degenerative changes of the spine, pelvis and hips.  Advanced degenerative changes noted of the right hip as before.  IMPRESSION: Distal small bowel obstruction extending to the terminal ileum in the right lower quadrant.  Terminal ileum demonstrates wall thickening with mucosal enhancement and adjacent mild inflammatory stranding and free fluid suspicious for terminal ileitis.  No free air or abscess  Cholelithiasis  Diverticulosis  Critical Value/emergent results were called by telephone at the time of interpretation on 09/30/2012 at 8:35 a.m. to Dr. Otila Kluver, who verbally acknowledged these results.   Original Report Authenticated By: Judie Petit. Miles Costain, M.D.     Anti-infectives: Anti-infectives   Start     Dose/Rate Route Frequency Ordered Stop   09/30/12 1330  metroNIDAZOLE (FLAGYL) IVPB 500 mg     500 mg 100 mL/hr over 60 Minutes Intravenous Every 8 hours 09/30/12 1318     09/30/12 1315  ciprofloxacin (CIPRO) IVPB 400 mg     400 mg 200 mL/hr over 60 Minutes Intravenous Every 12 hours 09/30/12 1318         Assessment/Plan  1. SBO, resolving  Plan: 1. Dc NGT and give clear liquids. 2. Could probably advance diet as tolerates.   LOS: 1 day    Prentiss Hammett E 10/01/2012, 7:44 AM Pager: [phone]

## 2012-10-01 NOTE — Progress Notes (Signed)
Utilization Review Completed.   Makenah Karas, RN, BSN Nurse Case Manager  [phone]  

## 2012-10-01 NOTE — Progress Notes (Signed)
Family Medicine Teaching Service Daily Progress Note Service Page: [phone]  Subjective:  Feeling well this am.  Reports 3 BM's yesterday.  Currently passing flatus. Surgery has seen patient this am and D/C NG tube.  Patient now eating clear liquid diet.  Objective: Temp:  [97.5 F (36.4 C)-99.2 F (37.3 C)] 98.4 F (36.9 C) (05/05 0602) Pulse Rate:  [90-96] 90 (05/05 0602) Resp:  [20] 20 (05/05 0602) BP: (145-189)/(88-97) 148/96 mmHg (05/05 0602) SpO2:  [95 %-96 %] 96 % (05/05 0602) Weight:  [220 lb (99.791 kg)-227 lb (102.967 kg)] 220 lb (99.791 kg) (05/05 0602) Filed Weights   09/30/12 1330 10/01/12 0602  Weight: 227 lb (102.967 kg) 220 lb (99.791 kg)    Intake/Output Summary (Last 24 hours) at 10/01/12 0918 Last data filed at 10/01/12 0805  Gross per 24 hour  Intake    880 ml  Output   5001 ml  Net  -4121 ml   Exam: General: well appearing, sitting up in bed, eating breakfast Cardiovascular: RRR. No murmurs, rubs, or gallops. Respiratory: CTAB. No rales, rhonchi, or wheeze. Abdomen: soft, mild epigastric/RUQ tenderness. + BS. Extremities: No edema noted.  CBC BMET   Recent Labs Lab 09/30/12 0634 09/30/12 1435 10/01/12 0555  WBC 14.4* 14.4* 11.3*  HGB 17.7* 18.0* 15.8  HCT 46.6 48.5 44.6  PLT 144* 157 144*    Recent Labs Lab 09/30/12 0634 09/30/12 1435 10/01/12 0555  NA 142  --  144  K 3.1*  --  2.8*  CL 98  --  99  CO2 31  --  35*  BUN 18  --  22  CREATININE 0.95 1.15 1.08  GLUCOSE 168*  --  138*  CALCIUM 10.0  --  8.8     Imaging/Diagnostic Tests:  Ct Abdomen Pelvis W Contrast 09/30/2012  IMPRESSION: Distal small bowel obstruction extending to the terminal ileum in the right lower quadrant.  Terminal ileum demonstrates wall thickening with mucosal enhancement and adjacent mild inflammatory stranding and free fluid suspicious for terminal ileitis.  No free air or abscess  Cholelithiasis  Diverticulosis  Critical Value/emergent results were called  by telephone at the time of interpretation on 09/30/2012 at 8:35 a.m. to Dr. Otila Kluver, who verbally acknowledged these results.     Assessment/Plan: Patrick [name] is a 62 y.o. year old male with PMH of diet-controlled DM-2, HTN, CHF and HLD who presents with abdominal pain. CT scan revealed SBO and evidence of terminal ileitis.   # Small bowel obstruction and terminal ileitis - Unlear etiology at this time. Patient does have history of hernia surgery. He does not report any history of diarrhea or bloody stools that would be consistent with history of IBD.  - General surgery following. We appreciate their input and help managing this patient. - NG now discontinued and patient tolerating Clear liquid diet this morning.  - Will advance diet as tolerated. - Leukocytosis now improving (11.3).  Will continue empiric Cipro/Flagyl at this time and continue to monitor patient closely with serial abdominal exams.   # Dehydration  - Resolved. Patient now tolerating clear liquid diet.  # Hx of CHF   - Last Echo (2009) revealed slightly decreased EF of 45%  - Will monitor volume status closely. No signs of acute exacerbation at this time.  Will continue to hold home Lasix.  # DM-2  - Diet controlled. Last A1C was 5.4 in January, 2014.  - Will not monitor CBG's or start insulin therapy.   # HTN  -  Patient now tolerating diet - BP elevated at 148/96 this am.  Will restart home Coreg and Triamterene/HCTZ.  Holding ACEI at this time.   - Will monitor BP closely and restart ACEI if BP not controlled with above regimen.  FEN/GI: NPO  Prophylaxis: Heparin SQ  Disposition: Pending clinical improvement.  Code Status: Full Code  Everlene Other, DO 10/01/2012, 9:13 AM

## 2012-10-01 NOTE — Progress Notes (Signed)
Patient tolerating clear liquids. Had a BM this afternoon. Abd - soft, non-tender. Advance to full liquids Advance as tolerated.  Discharge per primary team. No surgical problems - will sign off for now.  Call us if needed.  Wilmon Arms. Corliss Skains, MD, [facility]  10/01/2012 3:50 PM

## 2012-10-02 DIAGNOSIS — E860 Dehydration: Secondary | ICD-10-CM

## 2012-10-02 DIAGNOSIS — E876 Hypokalemia: Secondary | ICD-10-CM

## 2012-10-02 DIAGNOSIS — K5000 Crohn's disease of small intestine without complications: Secondary | ICD-10-CM

## 2012-10-02 DIAGNOSIS — K56609 Unspecified intestinal obstruction, unspecified as to partial versus complete obstruction: Secondary | ICD-10-CM

## 2012-10-02 LAB — BASIC METABOLIC PANEL
CO2: 32 mEq/L (ref 19–32)
GFR calc non Af Amer: 62 mL/min — ABNORMAL LOW (ref >90)
Glucose, Bld: 165 mg/dL — ABNORMAL HIGH (ref 70–99)
Potassium: 3.6 mEq/L (ref 3.5–5.1)
Sodium: 137 mEq/L (ref 135–145)

## 2012-10-02 MED ORDER — CIPROFLOXACIN HCL 500 MG PO TABS: 500.0000 mg | ORAL_TABLET | Freq: Two times a day (BID) | ORAL | Status: DC

## 2012-10-02 MED ORDER — LISINOPRIL 20 MG PO TABS
20.0000 mg | ORAL_TABLET | Freq: Every day | ORAL | Status: DC
  Administered 2012-10-02: 20 mg via ORAL
  Filled 2012-10-02: qty 1

## 2012-10-02 MED ORDER — ONDANSETRON HCL 4 MG PO TABS: 4.0000 mg | ORAL_TABLET | Freq: Three times a day (TID) | ORAL | Status: DC | PRN

## 2012-10-02 MED ORDER — METRONIDAZOLE 500 MG PO TABS: 500.0000 mg | ORAL_TABLET | Freq: Three times a day (TID) | ORAL | Status: DC

## 2012-10-02 NOTE — Progress Notes (Signed)
10/02/12 1145 In to speak with pt. about discharge planning and medication assistance.  Pt. does not have any insurance at this time, and is interested in medication assistance.  Pt. qualifies for the MATCH program.  NCM will give pt. MATCH letter that will afford the pt. 34days of medications for a $3 co-pay/ RX.  Pt. will have to use a specific pharmacies listed on the MATCH letter, and the pt. has to use the letter within 7days of dc.  Pt. to dc home today.  Rashia Mckesson, RN, BSN NCM [phone] 

## 2012-10-02 NOTE — Discharge Summary (Signed)
Family Medicine Teaching [facility] Discharge Summary  Patient name: Patrick [name] Medical record number: [id number] Date of birth: [birth year] Age: 62 y.o. Gender: male Date of Admission: 09/30/2012  Date of Discharge: 10/02/12 Admitting Physician: Barbaraann Barthel, MD  Primary Care Provider: Ardyth Gal, MD  Indication for Hospitalization: Abdominal pain  Discharge Diagnoses:  Principal Problem:   Small bowel obstruction Active Problems:   Diet-controlled type 2 diabetes mellitus   HYPERTENSION   CONGESTIVE HEART FAILURE   Terminal ileitis   Hypokalemia   Dehydration  Brief Hospital Course:  62 y.o. year old male with PMH of diet-controlled DM-2, HTN, CHF and HLD who presents with abdominal pain. CT scan revealed SBO and evidence of terminal ileitis.   1) Small bowel obstruction and terminal ileitis - Given presentation, mild leukocytosis, and abdominal pain, CT scan of the abdomen/pelvis was obtained and revealed distal small bowel obstruction extending to the terminal ileum and wall thickening and inflammatory stranding consistent with terminal ileitis. - General surgery was consulted. Patient was made NPO and NG tube was placed for decompression. - Patient was started on IV fluids, pain was controlled with IV morphine, and nausea was managed with Zofran.  Given evidence of terminal ileitis patient was started on empiric cipro and flagyl.  - Patient improved quickly and began passing stool and flatus.  NG tube was removed on 5/5 and clear liquid diet was started.  Patient tolerated clear liquid diet and was slowly advanced to regular diet. - At discharge, patient was tolerating diet without nausea and had no abdominal pain.  Of note, patient did have some diarrhea prior to discharge.  Patient discharged home on Cipro/flagyl to complete a 7 day course.   2) Dehydration  - Patient dry on physical exam and was hemoconcentrated with Hb of 18 on admission. - Patient was given IV  fluids until he was taking adequate PO intake.  3) Hx of CHF  - Last Echo (2009) revealed slightly decreased EF of 45%  - In the setting of dehydration, home Lasix was held.  - Volume status was monitored closely and patient had no signs of exacerbation during admission.   4) DM-2  - Diet controlled. Last A1C was 5.4 in January, 2014.  - CBG's were not obtained during admission and no therapy was started.  5) HTN  - HTN was treated initially with PRN hydralazine in the setting of NPO status. - Home medications (Triamterene/HCTZ, Coreg, and Lisinopril) were restarted after patient was tolerating PO intake.  6) Hypokalemia  - Resolved after repletion.  Significant Labs and Imaging:   CBC BMET   Recent Labs Lab 09/30/12 0634 09/30/12 1435 10/01/12 0555  WBC 14.4* 14.4* 11.3*  HGB 17.7* 18.0* 15.8  HCT 46.6 48.5 44.6  PLT 144* 157 144*    Recent Labs Lab 09/30/12 0634 09/30/12 1435 10/01/12 0555 10/02/12 1035  NA 142  --  144 137  K 3.1*  --  2.8* 3.6  CL 98  --  99 97  CO2 31  --  35* 32  BUN 18  --  22 18  CREATININE 0.95 1.15 1.08 1.20  GLUCOSE 168*  --  138* 165*  CALCIUM 10.0  --  8.8 9.0     Ct Abdomen Pelvis W Contrast  09/30/2012 IMPRESSION: Distal small bowel obstruction extending to the terminal ileum in the right lower quadrant. Terminal ileum demonstrates wall thickening with mucosal enhancement and adjacent mild inflammatory stranding and free fluid suspicious for terminal ileitis.  No free air or abscess Cholelithiasis Diverticulosis. Procedures: None  Consultations: General Surgery, Dr. Lindie Spruce  Discharge Medications:    Medication List    TAKE these medications       aspirin 325 MG tablet  Take 1 tablet (325 mg total) by mouth daily.     BIOFLAVONOIDS PO  Take 1 tablet by mouth 2 (two) times daily.     carvedilol 25 MG tablet  Commonly known as:  COREG  Take 1 tablet (25 mg total) by mouth 2 (two) times daily with a meal.     ciprofloxacin  500 MG tablet  Commonly known as:  CIPRO  Take 1 tablet (500 mg total) by mouth 2 (two) times daily. For 5 days.     finasteride 5 MG tablet  Commonly known as:  PROSCAR  Take 1 tablet (5 mg total) by mouth daily.     furosemide 40 MG tablet  Commonly known as:  LASIX  Take 1 tablet (40 mg total) by mouth daily.     ibuprofen 800 MG tablet  Commonly known as:  ADVIL,MOTRIN  Take 800 mg by mouth every evening.     lisinopril 20 MG tablet  Commonly known as:  PRINIVIL,ZESTRIL  Take 1 tablet (20 mg total) by mouth daily.     lovastatin 20 MG tablet  Commonly known as:  MEVACOR  Take 1 tablet (20 mg total) by mouth at bedtime.     metroNIDAZOLE 500 MG tablet  Commonly known as:  FLAGYL  Take 1 tablet (500 mg total) by mouth 3 (three) times daily. For 5 days.     multivitamin with minerals tablet  Take 1 tablet by mouth daily.     ondansetron 4 MG tablet  Commonly known as:  ZOFRAN  Take 1 tablet (4 mg total) by mouth every 8 (eight) hours as needed for nausea.     potassium gluconate 595 MG Tabs  Take 595 mg by mouth 2 (two) times daily.     traZODone 150 MG tablet  Commonly known as:  DESYREL  Take 1 tablet (150 mg total) by mouth at bedtime.     triamterene-hydrochlorothiazide 75-50 MG per tablet  Commonly known as:  MAXZIDE  Take 0.5 tablets by mouth daily.     vitamin C 1000 MG tablet  Take 2,000 mg by mouth 2 (two) times daily.       Issues for Follow Up: 1) Resolution of symptoms 2) Follow up diarrhea and ensure resolution.   3) HTN - consider increasing/adding medications 4) Completion of antibiotic course  Outstanding Results: None  Discharge Instructions:  Patient was counseled important signs and symptoms that should prompt return to medical care, changes in medications, dietary instructions, activity restrictions, and follow up appointments.   Follow-up Information   Follow up with [facility], MD On 10/10/2012. (245pm)    Contact  information:   [address] [place] [address] [phone]      Discharge Condition: Stable. Discharged home.  Everlene Other, DO 10/02/2012, 4:59 PM

## 2012-10-02 NOTE — Progress Notes (Signed)
Family Medicine Teaching Service Daily Progress Note Service Page: [phone]  Subjective:  Overnight: 4 beat run of asymptomatic VT.  Feeling well this am.  Having BM's and passing flatus. No complaints. Tolerating diet well.  Objective: Temp:  [97.9 F (36.6 C)-99.1 F (37.3 C)] 99.1 F (37.3 C) (05/06 0514) Pulse Rate:  [77-92] 92 (05/06 0514) Resp:  [20] 20 (05/06 0514) BP: (132-156)/(77-90) 156/78 mmHg (05/06 0514) SpO2:  [94 %-96 %] 96 % (05/06 0514) Weight:  [221 lb 3.2 oz (100.336 kg)] 221 lb 3.2 oz (100.336 kg) (05/06 0514) Filed Weights   09/30/12 1330 10/01/12 0602 10/02/12 0514  Weight: 227 lb (102.967 kg) 220 lb (99.791 kg) 221 lb 3.2 oz (100.336 kg)    Intake/Output Summary (Last 24 hours) at 10/02/12 0919 Last data filed at 10/02/12 4782  Gross per 24 hour  Intake 2669.16 ml  Output    850 ml  Net 1819.16 ml   Exam: General: well appearing, sitting up in bed, eating breakfast Cardiovascular: RRR. No murmurs, rubs, or gallops. Respiratory: CTAB. No rales, rhonchi, or wheeze. Abdomen: soft, nontender, nondistended. +BS. Extremities: No edema noted.  CBC BMET   Recent Labs Lab 09/30/12 0634 09/30/12 1435 10/01/12 0555  WBC 14.4* 14.4* 11.3*  HGB 17.7* 18.0* 15.8  HCT 46.6 48.5 44.6  PLT 144* 157 144*    Recent Labs Lab 09/30/12 0634 09/30/12 1435 10/01/12 0555  NA 142  --  144  K 3.1*  --  2.8*  CL 98  --  99  CO2 31  --  35*  BUN 18  --  22  CREATININE 0.95 1.15 1.08  GLUCOSE 168*  --  138*  CALCIUM 10.0  --  8.8     Imaging/Diagnostic Tests:  Ct Abdomen Pelvis W Contrast 09/30/2012  IMPRESSION: Distal small bowel obstruction extending to the terminal ileum in the right lower quadrant.  Terminal ileum demonstrates wall thickening with mucosal enhancement and adjacent mild inflammatory stranding and free fluid suspicious for terminal ileitis.  No free air or abscess  Cholelithiasis  Diverticulosis  Critical Value/emergent results were  called by telephone at the time of interpretation on 09/30/2012 at 8:35 a.m. to Dr. Otila Kluver, who verbally acknowledged these results.     Assessment/Plan: Patrick [name] is a 62 y.o. year old male with PMH of diet-controlled DM-2, HTN, CHF and HLD who presents with abdominal pain. CT scan revealed SBO and evidence of terminal ileitis.   # Small bowel obstruction and terminal ileitis - Unlear etiology at this time. Patient does have history of hernia surgery. He does not report any history of diarrhea or bloody stools that would be consistent with history of IBD.  - NG now discontinued and patient tolerating Full liquid diet. - Leukocytosis now improving (11.3).  Will continue empiric Cipro/Flagyl for 7 day course.  # Dehydration  - Resolved. Patient now tolerating clear liquid diet.  # Hx of CHF   - Last Echo (2009) revealed slightly decreased EF of 45%  - Will monitor volume status closely. No signs of acute exacerbation at this time.  Will continue to hold home Lasix.  # DM-2  - Diet controlled. Last A1C was 5.4 in January, 2014.  - Will not monitor CBG's or start insulin therapy.   # HTN  - Will continue home Triamterene/HCTZ, Coreg. - ACEI restarted today.  # Hypokalemia - BMP this am.  Will replete if needed.  FEN/GI: Full liquid diet. Prophylaxis: Heparin SQ  Disposition: D/C home  today. Code Status: Full Code  Everlene Other, DO 10/02/2012, 9:19 AM

## 2012-10-02 NOTE — Progress Notes (Signed)
FMTS Attending Admission Note: Patrick Hyder,MD I  have seen and examined this patient, reviewed their chart. I have discussed this patient with the resident. I agree with the resident's findings, assessment and care plan.  Patient improved but now having watery stool,might get C.diff since he has been on antibiotic,also to advance his diet today.

## 2012-10-03 NOTE — Discharge Summary (Signed)
FMTS Attending Admission Note: Alia Parsley,MD I  have seen and examined this patient, reviewed their chart. I have discussed this patient with the resident. I agree with the resident's findings, assessment and care plan.  

## 2012-10-10 ENCOUNTER — Encounter: Payer: Self-pay | Admitting: Family Medicine

## 2012-10-10 ENCOUNTER — Ambulatory Visit (INDEPENDENT_AMBULATORY_CARE_PROVIDER_SITE_OTHER): Payer: No Typology Code available for payment source | Admitting: Family Medicine

## 2012-10-10 VITALS — BP 135/76 | HR 73 | Temp 98.3°F | Ht 69.0 in | Wt 221.2 lb

## 2012-10-10 DIAGNOSIS — E876 Hypokalemia: Secondary | ICD-10-CM

## 2012-10-10 DIAGNOSIS — K56609 Unspecified intestinal obstruction, unspecified as to partial versus complete obstruction: Secondary | ICD-10-CM

## 2012-10-10 DIAGNOSIS — K5000 Crohn's disease of small intestine without complications: Secondary | ICD-10-CM

## 2012-10-10 DIAGNOSIS — I10 Essential (primary) hypertension: Secondary | ICD-10-CM

## 2012-10-10 MED ORDER — POTASSIUM CHLORIDE CRYS ER 20 MEQ PO TBCR: 20.0000 meq | EXTENDED_RELEASE_TABLET | Freq: Two times a day (BID) | ORAL | Status: DC

## 2012-10-10 NOTE — Assessment & Plan Note (Signed)
Resolved, doing well, knows to call/seek medical care if he starts having symptoms again.

## 2012-10-10 NOTE — Progress Notes (Signed)
  Subjective:    Patient ID: Patrick [name], male    DOB: 04-19-1951, 62 y.o.   MRN: [id number]  HPI:  Patrick [name] comes in for hospital follow up.  He says he is doing much better.  He had a small bowel obstruction, and had to receive IVF and do bowel rest.  There was also concern for terminal ileitis  (c-diff neg) due to diarrhea and CT scan findings and he has finished his course of cipro/flagyl.  He reports no more diarrhea.    He has a history of CHF and takes BP medications and diuretics.  He has had hypokalemia in the past, and was prescribed potassium chloride.  This was expensive, so he bought OTC potassium gluconate.  His potassium was low again in the hospital so he wanted to know if this was enough potassium.  He is taking his blood pressure medications without difficulty, no chest pain, dyspnea, LE edema, palpitations.   Past Medical History  Diagnosis Date  . CHF (congestive heart failure)   . Hypertension   . DM II (diabetes mellitus, type II), controlled     Diet controlled.   . Hyperlipidemia     History  Substance Use Topics  . Smoking status: Passive Smoke Exposure - Never Smoker    Types: Pipe, Cigars  . Smokeless tobacco: Not on file  . Alcohol Use: No    Family History  Problem Relation Age of Onset  . Hypertension Mother   . Diabetes Mother   . Stroke Mother   . Cancer Father 52    Prostate  . Dementia Father   . COPD Sister   . Arthritis Sister   . Edema Sister      ROS Pertinent items in HPI    Objective:  Physical Exam:  BP 135/76  Pulse 73  Temp(Src) 98.3 F (36.8 C) (Oral)  Ht 5\' 9"  (1.753 m)  Wt 221 lb 4 oz (100.358 kg)  BMI 32.66 kg/m2 General appearance: alert, cooperative and no distress Head: Normocephalic, without obvious abnormality, atraumatic Lungs: clear to auscultation bilaterally Heart: regular rate and rhythm, S1, S2 normal, no murmur, click, rub or gallop Abdomen: +BS, soft non-tender, no rebound, no organomegaly.  Pulses: 2+ and  symmetric       Assessment & Plan:

## 2012-10-10 NOTE — Assessment & Plan Note (Signed)
Symptoms resolved and pt has completed antibiotics.

## 2012-10-10 NOTE — Assessment & Plan Note (Signed)
Well-controlled on current medications, no changes. 

## 2012-10-10 NOTE — Assessment & Plan Note (Signed)
Consulted with pharmacy student, patient would need to take about 8 calcium gluconate tablets daily to match 20 meq of K-dur.  He agrees to buy the K-dur.

## 2012-10-10 NOTE — Patient Instructions (Signed)
It was good to see you today, I am glad you are feeling so much better.  I recommend you getting the prescription strength dose of potassium, but if you are unable to, you need to take two of the over the counter pills four times a day to get the same amount of potassium.   Your blood pressure today was BP: 135/76 mmHg.  Remember your goal blood pressure is about 130/80.  Please be sure to take your medication every day.    Please call the office if you are having any more stomach issues.

## 2012-10-18 ENCOUNTER — Telehealth: Payer: Self-pay | Admitting: Family Medicine

## 2012-10-18 MED ORDER — POTASSIUM CHLORIDE CRYS ER 20 MEQ PO TBCR: 20.0000 meq | EXTENDED_RELEASE_TABLET | Freq: Two times a day (BID) | ORAL | Status: DC

## 2012-10-18 NOTE — Telephone Encounter (Signed)
Pt is confused about new potassium prescription. It states to take it twice a day but only got 30. Also could he get the generic Please advise

## 2012-10-18 NOTE — Telephone Encounter (Signed)
LMOVM regarding message below from MD.  Radene Ou, CMA

## 2012-10-18 NOTE — Telephone Encounter (Signed)
New Rx sent in with 60 pills/month.  Also, instructions to give Generic in Rx. Please notify him.

## 2012-10-18 NOTE — Telephone Encounter (Signed)
Will fwd to MD for advice.  Tonye Tancredi L, CMA  

## 2012-11-05 ENCOUNTER — Other Ambulatory Visit: Payer: Self-pay | Admitting: Family Medicine

## 2012-12-10 ENCOUNTER — Other Ambulatory Visit: Payer: Self-pay | Admitting: Family Medicine

## 2012-12-14 NOTE — Telephone Encounter (Signed)
I discussed with patient Ibuprofen 800 mg prn is an high dose with s/e such as HTN,CHF,bleeding ulcers,he stated he used medication only as needed,he is aware of s/e and will like to continue current dose.

## 2012-12-19 ENCOUNTER — Other Ambulatory Visit: Payer: Self-pay | Admitting: Family Medicine

## 2012-12-19 ENCOUNTER — Telehealth: Payer: Self-pay | Admitting: Family Medicine

## 2012-12-19 MED ORDER — OLMESARTAN MEDOXOMIL 20 MG PO TABS: 20.0000 mg | ORAL_TABLET | Freq: Every day | ORAL | Status: DC

## 2012-12-19 MED ORDER — CARVEDILOL PHOSPHATE ER 40 MG PO CP24: 40.0000 mg | ORAL_CAPSULE | Freq: Every day | ORAL | Status: DC

## 2012-12-19 MED ORDER — ROSUVASTATIN CALCIUM 10 MG PO TABS: 10.0000 mg | ORAL_TABLET | Freq: Every day | ORAL | Status: DC

## 2012-12-19 MED ORDER — DUTASTERIDE 0.5 MG PO CAPS: 0.5000 mg | ORAL_CAPSULE | Freq: Every day | ORAL | Status: DC

## 2012-12-19 NOTE — Telephone Encounter (Signed)
Patient had been on Lisinopril in addition to Triamterene-HCTZ,also taking potassium for hypokalemia, last K+ was normal. I switched his Lisinopril to benicar due to cost effectiveness. I spoke to him and advised he d/c potassium supplement for now due to risk of hyperkalemia. He is also advised to follow up in few months for K+ check,he verbalized understanding.

## 2012-12-19 NOTE — Telephone Encounter (Signed)
Patient's insurance does not cover Lisinopril, Carvedilol,Finasteride and Lovastatin,alternative includes benicar,Coreg CR,Avodart and Crestor, I discussed with patient he is aware and will like to switch his medications.

## 2013-01-07 ENCOUNTER — Other Ambulatory Visit: Payer: Self-pay | Admitting: Family Medicine

## 2013-02-05 ENCOUNTER — Other Ambulatory Visit: Payer: Self-pay | Admitting: Family Medicine

## 2013-02-05 DIAGNOSIS — I10 Essential (primary) hypertension: Secondary | ICD-10-CM

## 2013-02-05 DIAGNOSIS — E119 Type 2 diabetes mellitus without complications: Secondary | ICD-10-CM

## 2013-02-05 MED ORDER — FUROSEMIDE 40 MG PO TABS: 40.0000 mg | ORAL_TABLET | Freq: Every day | ORAL | Status: DC

## 2013-02-07 ENCOUNTER — Ambulatory Visit (INDEPENDENT_AMBULATORY_CARE_PROVIDER_SITE_OTHER): Payer: No Typology Code available for payment source | Admitting: *Deleted

## 2013-02-07 DIAGNOSIS — Z23 Encounter for immunization: Secondary | ICD-10-CM

## 2013-03-14 ENCOUNTER — Telehealth: Payer: Self-pay | Admitting: Family Medicine

## 2013-03-14 DIAGNOSIS — E119 Type 2 diabetes mellitus without complications: Secondary | ICD-10-CM

## 2013-03-14 DIAGNOSIS — I10 Essential (primary) hypertension: Secondary | ICD-10-CM

## 2013-03-14 NOTE — Telephone Encounter (Signed)
[name] pharmacy sent a request to [place] on Sat.  Haven't heard back.  Please send refill for his bp medication, triamterene-hctz

## 2013-03-15 MED ORDER — TRIAMTERENE-HCTZ 75-50 MG PO TABS
0.5000 | ORAL_TABLET | Freq: Every day | ORAL | Status: DC
Start: 2013-03-15 — End: 2013-05-13

## 2013-03-15 NOTE — Telephone Encounter (Signed)
Left message making pt aware that rx was called into pharmacy. Jazmin Hartsell,CMA

## 2013-03-15 NOTE — Telephone Encounter (Signed)
Done sent to [facility] on [address].  Recommend checking BMET for creatinine/potassium as patient is on HCTZ-triamterene, ACE-I, and ARB.

## 2013-03-28 ENCOUNTER — Encounter: Payer: Self-pay | Admitting: Family Medicine

## 2013-03-28 ENCOUNTER — Ambulatory Visit (INDEPENDENT_AMBULATORY_CARE_PROVIDER_SITE_OTHER): Payer: No Typology Code available for payment source | Admitting: Family Medicine

## 2013-03-28 VITALS — BP 154/82 | HR 64 | Temp 98.6°F | Ht 69.0 in | Wt 220.0 lb

## 2013-03-28 DIAGNOSIS — M25561 Pain in right knee: Secondary | ICD-10-CM

## 2013-03-28 DIAGNOSIS — I509 Heart failure, unspecified: Secondary | ICD-10-CM

## 2013-03-28 DIAGNOSIS — I10 Essential (primary) hypertension: Secondary | ICD-10-CM

## 2013-03-28 DIAGNOSIS — M79609 Pain in unspecified limb: Secondary | ICD-10-CM

## 2013-03-28 DIAGNOSIS — E785 Hyperlipidemia, unspecified: Secondary | ICD-10-CM

## 2013-03-28 DIAGNOSIS — M79644 Pain in right finger(s): Secondary | ICD-10-CM

## 2013-03-28 DIAGNOSIS — M79646 Pain in unspecified finger(s): Secondary | ICD-10-CM | POA: Insufficient documentation

## 2013-03-28 DIAGNOSIS — E876 Hypokalemia: Secondary | ICD-10-CM

## 2013-03-28 DIAGNOSIS — M25569 Pain in unspecified knee: Secondary | ICD-10-CM

## 2013-03-28 DIAGNOSIS — E119 Type 2 diabetes mellitus without complications: Secondary | ICD-10-CM

## 2013-03-28 LAB — BASIC METABOLIC PANEL
CO2: 32 mEq/L (ref 19–32)
Chloride: 100 mEq/L (ref 96–112)
Sodium: 142 mEq/L (ref 135–145)

## 2013-03-28 LAB — POCT GLYCOSYLATED HEMOGLOBIN (HGB A1C): Hemoglobin A1C: 5.3

## 2013-03-28 NOTE — Assessment & Plan Note (Signed)
COntinue Crestor. Continue diet and exercise control as well.

## 2013-03-28 NOTE — Patient Instructions (Signed)
Knee Pain  Knee pain can be a result of an injury or other medical conditions. Treatment will depend on the cause of your pain.  HOME CARE   Only take medicine as told by your doctor.   Keep a healthy weight. Being overweight can make the knee hurt more.   Stretch before exercising or playing sports.   If there is constant knee pain, change the way you exercise. Ask your doctor for advice.   Make sure shoes fit well. Choose the right shoe for the sport or activity.   Protect your knees. Wear kneepads if needed.   Rest when you are tired.  GET HELP RIGHT AWAY IF:    Your knee pain does not stop.   Your knee pain does not get better.   Your knee joint feels hot to the touch.   You have a temperature by mouth above 102 F (38.9 C), not controlled by medicine.   Your baby is older than 3 months with a rectal temperature of 102 F (38.9 C) or higher.   Your baby is 3 months old or younger with a rectal temperature of 100.4 F (38 C) or higher.  MAKE SURE YOU:    Understand these instructions.   Will watch this condition.   Will get help right away if you are not doing well or get worse.  Document Released: 08/12/2008 Document Revised: 08/08/2011 Document Reviewed: 08/12/2008  [employer] Patient Information 2014 [employer].

## 2013-03-28 NOTE — Assessment & Plan Note (Addendum)
A1C checked today 5.2 Continue diet control, may check A1C Q 6 month. DM foot exam completed: Normal pulses,no ulceration,no loss of sensation,abnormal toe nail likely fungal infection. To f/u to address toe nail deformity. Retinal scanning done today,will f/u with report.
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
## 2013-03-28 NOTE — Assessment & Plan Note (Signed)
R/O RA. Rheumatoid factor ordered.

## 2013-03-28 NOTE — Assessment & Plan Note (Signed)
Likely arthritis. Xray ordered. Tylenol prn pain. If xray normal will refer to PT

## 2013-03-28 NOTE — Assessment & Plan Note (Signed)
Initial BP check was 170s/90s. Repeat BP few min later was 154/82. Plan to increase Benicar once Creatinine level is normal. I will call to discuss test result and medication refill with patient. Advised home BP monitoring.

## 2013-03-28 NOTE — Assessment & Plan Note (Signed)
BMP checked today.

## 2013-03-28 NOTE — Assessment & Plan Note (Signed)
Euvolemic,asymptomatic. COntinue Coreg and ASA as well as ACEi.

## 2013-03-29 ENCOUNTER — Telehealth: Payer: Self-pay | Admitting: *Deleted

## 2013-03-29 ENCOUNTER — Other Ambulatory Visit: Payer: Self-pay | Admitting: Family Medicine

## 2013-03-29 MED ORDER — OLMESARTAN MEDOXOMIL 40 MG PO TABS: 40.0000 mg | ORAL_TABLET | Freq: Every day | ORAL | Status: DC

## 2013-03-29 NOTE — Telephone Encounter (Signed)
Message copied by Osborne Oman on Fri Mar 29, 2013 11:47 AM ------      Message from: Janit Pagan T      Created: Fri Mar 29, 2013 11:22 AM       Please call patient to come pick up stool card for hemoccult testing. ------

## 2013-03-29 NOTE — Telephone Encounter (Signed)
LMOVM asking pt to return call.  Please ask if he is willing to come pick these up.  If so I will put them up front. [name], Maryjo Rochester

## 2013-04-01 ENCOUNTER — Telehealth: Payer: Self-pay | Admitting: Family Medicine

## 2013-04-01 NOTE — Telephone Encounter (Signed)
Pt will come in to get the stool cards Told him not to come until afternoon

## 2013-05-07 ENCOUNTER — Ambulatory Visit (HOSPITAL_COMMUNITY)
Admission: RE | Admit: 2013-05-07 | Discharge: 2013-05-07 | Disposition: A | Discharge status: Home / Self Care | Payer: No Typology Code available for payment source | Source: Ambulatory Visit | Attending: Family Medicine | Admitting: Family Medicine

## 2013-05-07 ENCOUNTER — Other Ambulatory Visit: Payer: Self-pay | Admitting: Family Medicine

## 2013-05-07 DIAGNOSIS — IMO0002 Reserved for concepts with insufficient information to code with codable children: Secondary | ICD-10-CM | POA: Insufficient documentation

## 2013-05-07 DIAGNOSIS — M25561 Pain in right knee: Secondary | ICD-10-CM

## 2013-05-07 DIAGNOSIS — M1710 Unilateral primary osteoarthritis, unspecified knee: Secondary | ICD-10-CM | POA: Insufficient documentation

## 2013-05-07 MED ORDER — TRAZODONE HCL 150 MG PO TABS: 150.0000 mg | ORAL_TABLET | Freq: Every day | ORAL | Status: DC

## 2013-05-13 ENCOUNTER — Other Ambulatory Visit: Payer: Self-pay | Admitting: Family Medicine

## 2013-06-24 ENCOUNTER — Telehealth: Payer: Self-pay | Admitting: Family Medicine

## 2013-06-24 NOTE — Telephone Encounter (Signed)
Would like to have ibuphrone refilled. He would 90 pills a month. That gives him 2 per day. [facility] [address] [facility]

## 2013-06-25 ENCOUNTER — Other Ambulatory Visit: Payer: Self-pay | Admitting: Family Medicine

## 2013-06-25 MED ORDER — IBUPROFEN 800 MG PO TABS: 800.0000 mg | ORAL_TABLET | Freq: Two times a day (BID) | ORAL | Status: DC | PRN

## 2013-06-25 NOTE — Telephone Encounter (Signed)
Please let patient know refill done to his pharmacy of choice.

## 2013-06-25 NOTE — Telephone Encounter (Signed)
LM for pt informing him that his "rx was sent to pharmacy". Kashden Deboy,CMA

## 2013-07-03 ENCOUNTER — Ambulatory Visit (INDEPENDENT_AMBULATORY_CARE_PROVIDER_SITE_OTHER): Payer: Medicare Other | Admitting: Family Medicine

## 2013-07-03 VITALS — BP 144/90 | HR 69 | Temp 97.9°F | Wt 222.0 lb

## 2013-07-03 DIAGNOSIS — M25461 Effusion, right knee: Secondary | ICD-10-CM

## 2013-07-03 DIAGNOSIS — M25569 Pain in unspecified knee: Secondary | ICD-10-CM

## 2013-07-03 DIAGNOSIS — M25469 Effusion, unspecified knee: Secondary | ICD-10-CM

## 2013-07-03 NOTE — Assessment & Plan Note (Signed)
Acute joint effusion development over a day. Arthrocentesis performed for further evaluation and to improve patient comfort. Pain improved from 10/10 to near baseline at 3/10. Hemorrhagic on aspiration, therefore did not inject with steroids. Will send for cell count, culture and gram stain, and crystal analysis. History of gout in the past a possibility and want to rule out infection.

## 2013-07-03 NOTE — Patient Instructions (Addendum)
I am sorry you were having knee pain and swelling. You had a large effusion around the knee which we drained a significant portion of. Try to keep the ace bandage on to help your knee not get fluid again on it. We think this is due to your arthritis but we are going to evaluate for gout and infection.   For your labs, I will send you a letter if there are no medication changes needed. I will call you if we need to discuss your lab results.  Orders Placed This Encounter  Procedures  . Body fluid culture  . Gram stain  . Synovial cell count + diff, w/ crystals   See [place] back if this comes back or pain worsens,  Dr. Yong Channel Knee Arthrocentesis Arthrocentesis is a procedure for removing fluid from a joint. The procedure is used to remove uncomfortable amounts of fluid from a joint or to get a sample of joint fluid for testing. Testing joint fluid can help your health care provider figure out the cause of the pain or swelling you are having in your joint. Infection or gout, among other conditions, can cause fluid to form in joints, resulting in pain or swelling.  LET YOUR CAREGIVER KNOW ABOUT:   Allergies.  Medications taken including herbs, eye drops, over the counter medications, and creams.  Use of steroids (by mouth or creams).  Previous problems with anesthetics or Novocaine.  History of blood clots (thrombophlebitis).  History of bleeding or blood problems.  Previous surgery.  Other health problems. RISKS AND COMPLICATIONS  A local anesthetic may not numb the area well enough and you may feel some minor discomfort. In rare cases, you may have an allergic reaction to the drug used to numb the skin.  More fluid may form in the joint.  You may develop infection or bleeding. BEFORE THE PROCEDURE  Wash all of the skin around the entire knee area. Try to remove any loose, scaling skin. There is no other specific preparation necessary unless advised otherwise by your caregiver. AFTER  THE PROCEDURE   You can go home after the procedure.  You may need to put ice on the joint 15-20 minutes, 03-04 times a day until pain goes away.  You may need to put an elastic bandage on the joint. HOME CARE INSTRUCTIONS   Only take over-the-counter or prescription medicines for pain, discomfort, or fever as directed by your caregiver.  You should avoid stressing the joint. Unless advised otherwise, this includes jogging, bicycling, recreational climbing, hiking, and other activities that would put a lot of pressure on a knee joint.  Laying down and elevating the leg/knee above the level of your heart can help to minimize return of swelling. SEEK MEDICAL CARE IF:   You have repeated or worsening swelling.  There is drainage from the puncture area.  You develop red streaking that extends above or below the site where the needle had been placed. SEEK IMMEDIATE MEDICAL CARE IF:   You develop a fever.  You have pain that gets worse even though you are taking pain medicine.  The area is red and warm and you have trouble moving the joint. MAKE SURE YOU:   Understand these instructions.  Will watch your condition.  Will get help right away if you are not doing well or get worse. Document Released: 08/04/2006 Document Revised: 08/08/2011 Document Reviewed: 04/30/2007 [facility] Patient Information 2014 [place], [place].  Health Maintenance Due  Topic Date Due  . Colonoscopy  06/13/1998  . Zostavax  06/13/2008  . Pneumococcal Polysaccharide Vaccine Age 53 And Over  06/13/2013

## 2013-07-03 NOTE — Progress Notes (Signed)
Patrick Reddish, MD Phone: [phone]  Subjective:  Chief complaint-noted  Right knee pain and swelling Patient with a history of right knee arthritis. Usually pain 2-3/10 when taking ibuprofen. X-rays in both of the last 2 years which were unchanged. Had gout many years ago but no recurrence in last 5 years. Takes an aspirin but no other blood thinner. No recent medication changes.   Yesterday, patient noted gardual progression of his normal pain and it got up to 10/10 achy pain with occasional sharp feelings. Hurts through the whole knee. No radiation. Couldn't sleep well and constantly in pain last night. Having difficulty walking due to pain.    ROS- no locking, popping or giving way of joint. No fevers. Occasional chill last night. No redness over the joint.  Past Medical History- hypothyroidism, erectile dysfunction, hyperlipidemia, hypertension, CHF, BPH. History of similar knee pain about 2 years ago without arthrocentesis but does not remember if there was swelling.  Medications- reviewed (on aspirin) Current Outpatient Prescriptions  Medication Sig Dispense Refill  . Ascorbic Acid (VITAMIN C) 1000 MG tablet Take 2,000 mg by mouth 2 (two) times daily.      Marland Kitchen aspirin 325 MG tablet Take 1 tablet (325 mg total) by mouth daily.  30 tablet  2  . BIOFLAVONOIDS PO Take 1 tablet by mouth 2 (two) times daily.      . carvedilol (COREG CR) 40 MG 24 hr capsule Take 1 capsule (40 mg total) by mouth daily.  90 capsule  1  . dutasteride (AVODART) 0.5 MG capsule Take 1 capsule (0.5 mg total) by mouth daily.  90 capsule  1  . furosemide (LASIX) 40 MG tablet Take 1 tablet (40 mg total) by mouth daily.  30 tablet  5  . ibuprofen (ADVIL,MOTRIN) 800 MG tablet Take 1 tablet (800 mg total) by mouth every 12 (twelve) hours as needed for fever, headache, mild pain or moderate pain.  180 tablet  1  . Multiple Vitamins-Minerals (MULTIVITAMIN WITH MINERALS) tablet Take 1 tablet by mouth daily.      Marland Kitchen  olmesartan (BENICAR) 40 MG tablet Take 1 tablet (40 mg total) by mouth daily.  90 tablet  1  . rosuvastatin (CRESTOR) 10 MG tablet Take 1 tablet (10 mg total) by mouth daily.  90 tablet  1  . traZODone (DESYREL) 150 MG tablet Take 1 tablet (150 mg total) by mouth at bedtime.  30 tablet  4  . triamterene-hydrochlorothiazide (MAXZIDE) 75-50 MG per tablet TAKE ONE-HALF TABLET BY MOUTH ONCE DAILY  15 tablet  4   No current facility-administered medications for this visit.   Objective: BP 144/90  Pulse 69  Temp(Src) 97.9 F (36.6 C) (Oral)  Wt 222 lb (100.699 kg) Gen: uncomfortable when walking, moves very slowly and walks gingerly on right leg Ext: no edema at ankles  Left Knee: Normal to inspection with no erythema or effusion or obvious bony abnormalities. Palpation normal with no warmth or joint line tenderness or patellar tenderness or condyle tenderness. ROM normal in flexion and extension and lower leg rotation. Ligaments with solid consistent endpoints including ACL, PCL, LCL, MCL. Negative Mcmurray's  Non painful patellar compression. Patellar and quadriceps tendons unremarkable. Hamstring and quadriceps strength is normal.  Right Knee: Large effusion but no obvious bony abnormalities. Palpation reveals warmth, effusion, and joint line tenderness.  ROM limited in flexion and extension due to effusion Ligaments with solid consistent endpoints including ACL, PCL, LCL, MCL. Negative Mcmurray's and provocative meniscal tests. Patellar and  quadriceps tendons unremarkable. Hamstring and quadriceps strength is normal.   Skin: warm, dry, no redness over joint Neuro: intact distal sensation  Assessment/Plan:  BP mildly elevated due to pain.   KNEE PAIN, RIGHT Acute joint effusion development over a day. Arthrocentesis performed for further evaluation and to improve patient comfort. Pain improved from 10/10 to near baseline at 3/10. Hemorrhagic on aspiration, therefore did not  inject with steroids. Will send for cell count, culture and gram stain, and crystal analysis. History of gout in the past a possibility and want to rule out infection.    Orders Placed This Encounter  Procedures  . Body fluid culture  . Gram stain  . Synovial cell count + diff, w/ crystals

## 2013-07-03 NOTE — Progress Notes (Signed)
Procedure Note-Right Knee Arthrocentesis  After consent was obtained, using sterile technique the Right knee was prepped and 4 ml's of 1% plain Lidocaine used to anesthetize the needle tract into the joint from the lateral infrapatellar approach. The knee joint was entered and 95 ml's of red colored fluid was withdrawn and sent for cell count, differential, culture, gram stain, and fluid analysis.  The procedure was well tolerated.  Patient with immediate improvement of pain from 10/10 to 3/10.   The patient is asked to continue to rest the knee for a few more days before resuming regular activities.  Watch for fever, or increased swelling or persistent pain in knee. Call or return to clinic prn if such symptoms occur or the knee fails to improve as anticipated.

## 2013-07-04 LAB — SYNOVIAL CELL COUNT + DIFF, W/ CRYSTALS
EOSINOPHILS-SYNOVIAL: 0 % (ref 0–1)
LYMPHOCYTES-SYNOVIAL FLD: 2 % (ref 0–20)
Monocyte/Macrophage: 3 % — ABNORMAL LOW (ref 50–90)
NEUTROPHIL, SYNOVIAL: 95 % — AB (ref 0–25)
WBC, SYNOVIAL: 22080 uL — AB (ref 0–200)

## 2013-07-05 ENCOUNTER — Telehealth: Payer: Self-pay | Admitting: Family Medicine

## 2013-07-05 DIAGNOSIS — M25569 Pain in unspecified knee: Secondary | ICD-10-CM

## 2013-07-05 NOTE — Telephone Encounter (Signed)
Pt is calling back to see if he should still be taking Asprin once a day since he also taking ibuprofen? Please call and advise. jw

## 2013-07-05 NOTE — Telephone Encounter (Signed)
Pt called because he said that we called him. I didn't see anything. jw

## 2013-07-05 NOTE — Assessment & Plan Note (Signed)
Hemorrhagic aspiration (from the beginning and did not later turn hemorrhagic). Does have some monosodium urate crystals but neither this or arthritis would explain hemorrhagic aspirate. Aspirate was inflammatory but culture has not yielded any bacteria and septic joint was thought unlikely. Differential includes tibial plateau fracture (doubt as no injury), ACL/PCL/meniscus injury (but exam was reassuring for these), or Pigmented villonodular synovitis. Doubt gout as cause of such a large effusion despite monosodium urate crystals.  I discussed the importance of the hemorrhagic aspirate with patient and asked him to follow up in 2 weeks with Dr. Gwendlyn Deutscher, if pain is completely back to baseline or resolved (currently 4/10) then no further immediate workup. If recurred later and was severe or if no resolution of current symptoms, would need to consider MRI.

## 2013-07-05 NOTE — Telephone Encounter (Signed)
See right knee pain notes.

## 2013-07-05 NOTE — Telephone Encounter (Signed)
Pt is calling back to see if he should still be taking Asprin once a day since he also taking ibuprofen? Please call and advise. jw °

## 2013-07-05 NOTE — Telephone Encounter (Signed)
Returned call to patient and left message on VM that he should not take both meds daily.  Informed to call our office back on Monday.  Nolene Ebbs, RN

## 2013-07-05 NOTE — Telephone Encounter (Signed)
Please call to inform patient: ASA is to protect his heart and does help with pain,this is a daily medication,if helping with his pain he should not need to take Ibuprofen. Now Ibuprofen is only as needed medication and should not be taken daily. Remind patient Ibuprofen can also cause  GERD in addition to his ASA, so if anything he can take his ASA as oppose to his Ibuprofen.

## 2013-07-07 LAB — BODY FLUID CULTURE
GRAM STAIN: NONE SEEN
Organism ID, Bacteria: NO GROWTH

## 2013-07-08 ENCOUNTER — Other Ambulatory Visit: Payer: Self-pay | Admitting: *Deleted

## 2013-07-08 NOTE — Telephone Encounter (Signed)
Will need to fax written Rx to [facility] at [phone] (90-day supply).  ~Semaj Kham, BSN, RN-BC  

## 2013-07-09 MED ORDER — OLMESARTAN MEDOXOMIL 40 MG PO TABS: 40.0000 mg | ORAL_TABLET | Freq: Every day | ORAL | Status: DC

## 2013-07-16 ENCOUNTER — Ambulatory Visit: Payer: Medicare Other | Admitting: Family Medicine

## 2013-07-22 ENCOUNTER — Other Ambulatory Visit: Payer: Self-pay | Admitting: Family Medicine

## 2013-07-29 ENCOUNTER — Telehealth: Payer: Self-pay | Admitting: Family Medicine

## 2013-07-29 NOTE — Telephone Encounter (Signed)
Needs refills on the following; colestripol HCL 1 gram finastreride Carvedilol lisinipriol Omega 3 acid etheyl esters 1 gram capsule For Crestor--either atorvastin, pravstatin or simvastin He needs these refills because he has been dropped from the MAP program [facility] on [address]

## 2013-07-31 ENCOUNTER — Other Ambulatory Visit: Payer: Self-pay | Admitting: Family Medicine

## 2013-07-31 MED ORDER — LISINOPRIL 40 MG PO TABS: 40.0000 mg | ORAL_TABLET | Freq: Every day | ORAL | Status: DC

## 2013-07-31 MED ORDER — OMEGA-3-ACID ETHYL ESTERS 1 G PO CAPS: 2.0000 g | ORAL_CAPSULE | Freq: Two times a day (BID) | ORAL | Status: DC

## 2013-07-31 MED ORDER — FINASTERIDE 5 MG PO TABS: 5.0000 mg | ORAL_TABLET | Freq: Every day | ORAL | Status: DC

## 2013-07-31 MED ORDER — CARVEDILOL PHOSPHATE ER 40 MG PO CP24: 40.0000 mg | ORAL_CAPSULE | Freq: Every day | ORAL | Status: DC

## 2013-07-31 NOTE — Telephone Encounter (Signed)
Patient recently switched his insurance,hence switching his pharmacy and some of his medications. Avodart changed to Finasteride. Benicar changed to Lisinopril Crestor changed to Atorvastatin

## 2013-08-01 ENCOUNTER — Ambulatory Visit: Payer: Medicare Other | Admitting: Family Medicine

## 2013-08-01 ENCOUNTER — Other Ambulatory Visit: Payer: Self-pay | Admitting: Family Medicine

## 2013-08-01 MED ORDER — ATORVASTATIN CALCIUM 20 MG PO TABS: 20.0000 mg | ORAL_TABLET | Freq: Every day | ORAL | Status: DC

## 2013-08-01 NOTE — Telephone Encounter (Signed)
Pt called because all the medications where called in except Crestor. He would like it replaced with either Atorvastin, Pravstatin, or Simvastin. jw

## 2013-08-01 NOTE — Telephone Encounter (Signed)
Lipitor has been e-prescribed.

## 2013-08-01 NOTE — Telephone Encounter (Signed)
Will forward to MD to refill medication. Jazmin Hartsell,CMA  

## 2013-08-05 ENCOUNTER — Telehealth: Payer: Self-pay | Admitting: Family Medicine

## 2013-08-05 NOTE — Telephone Encounter (Signed)
Spoke with pharmacy, pt was previously receiving assistance with his coreg cr.  Now that he does not have this help, he needs something that would be covered by his insurance.   Pharmacist was unable to tell me what was covered. Will forward to MD [name], [name]

## 2013-08-05 NOTE — Telephone Encounter (Signed)
Pt called because he was able to pick up most of the medications except Carvedilol. He said that pharmacy didn't receive the request for that one. Can we resend this. jw

## 2013-08-05 NOTE — Telephone Encounter (Signed)
Pt called because he was able to pickup all his refills except Carvedilol. He said the pharmacy didn't have a request for this. Can we resend this to them so he can pick it up. jw

## 2013-08-06 ENCOUNTER — Other Ambulatory Visit: Payer: Self-pay | Admitting: Family Medicine

## 2013-08-06 ENCOUNTER — Telehealth: Payer: Self-pay | Admitting: *Deleted

## 2013-08-06 MED ORDER — CARVEDILOL 25 MG PO TABS: 25.0000 mg | ORAL_TABLET | Freq: Two times a day (BID) | ORAL | Status: DC

## 2013-08-06 NOTE — Telephone Encounter (Signed)
Please inform patient his Coreg has been switched to generic form which should be covered by his insurance,he is now on Coreg 25 mg BID instead of 40 mg daily of his extended release.Thank you.

## 2013-08-06 NOTE — Telephone Encounter (Signed)
Received Rx refill request for Benicar 20 mg tab: take 1 tablet by mouth daily from [place].  Did not see medication on current list.  MAP fax [phone] or pharmacist [phone]. Derl Barrow, RN

## 2013-08-06 NOTE — Telephone Encounter (Signed)
Pt informed. [name], Jessica Dawn  

## 2013-08-07 NOTE — Telephone Encounter (Signed)
Message left on Pharmacist line [phone] stating that pt is not on Benicar and not using [facility] anymore per PCP.  Derl Barrow, RN

## 2013-08-07 NOTE — Telephone Encounter (Signed)
Patient does not use [facility] anymore, please let them know this and he is no more on Benicar. Please also review patient's note. Thanks.

## 2013-08-13 ENCOUNTER — Ambulatory Visit (INDEPENDENT_AMBULATORY_CARE_PROVIDER_SITE_OTHER): Payer: Medicare Other | Admitting: Family Medicine

## 2013-08-13 ENCOUNTER — Encounter: Payer: Self-pay | Admitting: Family Medicine

## 2013-08-13 VITALS — BP 158/93 | HR 61 | Temp 98.3°F | Wt 228.0 lb

## 2013-08-13 DIAGNOSIS — E785 Hyperlipidemia, unspecified: Secondary | ICD-10-CM

## 2013-08-13 DIAGNOSIS — I509 Heart failure, unspecified: Secondary | ICD-10-CM

## 2013-08-13 DIAGNOSIS — E119 Type 2 diabetes mellitus without complications: Secondary | ICD-10-CM

## 2013-08-13 DIAGNOSIS — M79609 Pain in unspecified limb: Secondary | ICD-10-CM

## 2013-08-13 DIAGNOSIS — I10 Essential (primary) hypertension: Secondary | ICD-10-CM

## 2013-08-13 DIAGNOSIS — G4700 Insomnia, unspecified: Secondary | ICD-10-CM

## 2013-08-13 DIAGNOSIS — M79646 Pain in unspecified finger(s): Secondary | ICD-10-CM

## 2013-08-13 LAB — POCT GLYCOSYLATED HEMOGLOBIN (HGB A1C): Hemoglobin A1C: 5.9

## 2013-08-13 NOTE — Assessment & Plan Note (Signed)
Chronic. RF checked Oct 2014 was negative. Likely OA. I recommended Tylenol prn pain. Consider imaging if no improvement or worsening.

## 2013-08-13 NOTE — Assessment & Plan Note (Signed)
On Lipitor 20 mg qd. FLP checked today.

## 2013-08-13 NOTE — Patient Instructions (Signed)
Arthritis, Nonspecific  Arthritis is pain, redness, warmth, or puffiness (inflammation) of a joint. The joint may be stiff or hurt when you move it. One or more joints may be affected. There are many types of arthritis. Your doctor may not know what type you have right away. The most common cause of arthritis is wear and tear on the joint (osteoarthritis).  HOME CARE   · Only take medicine as told by your doctor.  · Rest the joint as much as possible.  · Raise (elevate) your joint if it is puffy.  · Use crutches if the painful joint is in your leg.  · Drink enough fluids to keep your pee (urine) clear or pale yellow.  · Follow your doctor's diet instructions.  · Use cold packs for very bad joint pain for 10 to 15 minutes every hour. Ask your doctor if it is okay for you to use hot packs.  · Exercise as told by your doctor.  · Take a warm shower if you have stiffness in the morning.  · Move your sore joints throughout the day.  GET HELP RIGHT AWAY IF:   · You have a fever.  · You have very bad joint pain, puffiness, or redness.  · You have many joints that are painful and puffy.  · You are not getting better with treatment.  · You have very bad back pain or leg weakness.  · You cannot control when you poop (bowel movement) or pee (urinate).  · You do not feel better in 24 hours or are getting worse.  · You are having side effects from your medicine.  MAKE SURE YOU:   · Understand these instructions.  · Will watch your condition.  · Will get help right away if you are not doing well or get worse.  Document Released: 08/10/2009 Document Revised: 11/15/2011 Document Reviewed: 08/10/2009  [employer]® Patient Information ©2014 [employer].

## 2013-08-13 NOTE — Progress Notes (Signed)
Subjective:     Patient ID: Patrick [name], male   DOB: 13-Aug-1950, 63 y.o.   MRN: [id number]  HPI Finger pain: C/O pain in her right index finger and his ring finger,pain is shooting nature on and off for about a yr,he denies worsening of his symptoms. He endorsed morning stiffness of his ring finger. He denies any swelling, no redness, he denies recent injury to his fingers. He is right handed,at times pain is bad enough to make him use his left. HTN/HLD:Compliant with his medications which includes Coreg 25 BID ( was on Coreg XR 40 qd),Lasix 40  Mg qd,Lisinopril 40 mg qd,and Maxzide 75/50 mg qd. Here for follow up.Recently started Lipitor for his HLD,denies any S/E to medication. DM:On diet control, here for follow up. CHF: Compliant with coreg. He has been entered into iron study in HF,hence need blood test done today. He denies any other concern.  Insomnia: Patient c/o difficulty sleeping, he uses Trazodone but this does not work as much as it used to. Patient stated typically he would go to bed at 1am and not sleep till 3am. He wakes up at 12 noon. This has been his normal sleeping schedule for many years,but he can't understand why he has to wait 2 hrs before falling asleep. He denies any choking or SOB at night, he was told he snores.  Current Outpatient Prescriptions on File Prior to Visit  Medication Sig Dispense Refill  . Ascorbic Acid (VITAMIN C) 1000 MG tablet Take 2,000 mg by mouth 2 (two) times daily.      Marland Kitchen aspirin 325 MG tablet Take 1 tablet (325 mg total) by mouth daily.  30 tablet  2  . atorvastatin (LIPITOR) 20 MG tablet Take 1 tablet (20 mg total) by mouth daily.  90 tablet  1  . BIOFLAVONOIDS PO Take 1 tablet by mouth 2 (two) times daily.      . carvedilol (COREG) 25 MG tablet Take 1 tablet (25 mg total) by mouth 2 (two) times daily with a meal.  180 tablet  1  . finasteride (PROSCAR) 5 MG tablet Take 1 tablet (5 mg total) by mouth daily.  90 tablet  1  . furosemide (LASIX) 40 MG  tablet TAKE 1 TABLET BY MOUTH EVERY DAY  30 tablet  6  . ibuprofen (ADVIL,MOTRIN) 800 MG tablet Take 1 tablet (800 mg total) by mouth every 12 (twelve) hours as needed for fever, headache, mild pain or moderate pain.  180 tablet  1  . lisinopril (PRINIVIL,ZESTRIL) 40 MG tablet Take 1 tablet (40 mg total) by mouth daily.  90 tablet  1  . Multiple Vitamins-Minerals (MULTIVITAMIN WITH MINERALS) tablet Take 1 tablet by mouth daily.      Marland Kitchen omega-3 acid ethyl esters (LOVAZA) 1 G capsule Take 2 capsules (2 g total) by mouth 2 (two) times daily.  360 capsule  1  . traZODone (DESYREL) 150 MG tablet Take 1 tablet (150 mg total) by mouth at bedtime.  30 tablet  4  . triamterene-hydrochlorothiazide (MAXZIDE) 75-50 MG per tablet TAKE ONE-HALF TABLET BY MOUTH ONCE DAILY  15 tablet  4   No current facility-administered medications on file prior to visit.   Past Medical History  Diagnosis Date  . CHF (congestive heart failure)   . Hypertension   . DM II (diabetes mellitus, type II), controlled     Diet controlled.   . Hyperlipidemia       Review of Systems  Eyes: Negative.  Respiratory: Negative.   Cardiovascular: Negative.  Negative for leg swelling.  Gastrointestinal: Negative.   Musculoskeletal:       Finger pain  Neurological: Negative.   Psychiatric/Behavioral: Positive for sleep disturbance.  All other systems reviewed and are negative.   Filed Vitals:   08/13/13 1046 08/13/13 1115 08/13/13 1130  BP: 150/85 168/103 158/93  Pulse: 61    Temp: 98.3 F (36.8 C)    TempSrc: Oral    Weight: 228 lb (103.42 kg)         Objective:   Physical Exam  Nursing note and vitals reviewed. Constitutional: He is oriented to person, place, and time. He appears well-developed. No distress.  Cardiovascular: Normal rate, regular rhythm, normal heart sounds and intact distal pulses.   No murmur heard. Pulmonary/Chest: Effort normal and breath sounds normal. No respiratory distress. He has no  wheezes. He exhibits no tenderness.  Abdominal: Soft. Bowel sounds are normal. He exhibits no distension and no mass. There is no tenderness.  Musculoskeletal: Normal range of motion. He exhibits no edema.  Neurological: He is alert and oriented to person, place, and time. No cranial nerve deficit.  Psychiatric: He has a normal mood and affect. His behavior is normal. Judgment and thought content normal.       Assessment:     Finger pain-right HTN Hyperlipdemia DM CHF Insomnia    Plan:     Check problem list.

## 2013-08-13 NOTE — Assessment & Plan Note (Addendum)
BP elevated today.Mostly concern about his diastolic readings. May be due to recent BP medication change. I recommended reduce salt intake. Continue current antihypertensive regimen. BMP checked today. Will recheck his BP in 2wks,if no improvement might consider increasing his regimen.

## 2013-08-13 NOTE — Assessment & Plan Note (Signed)
Likely due to poor sleep hygiene. Counseling done on sleep hygiene. He will work on this. Continue Trazodone prn insomnia.

## 2013-08-13 NOTE — Assessment & Plan Note (Signed)
Euvolemic. I discussed with patient about iron study in CHF patient. He agreed with iron study today. Iron study checked today. Continue coreg as instructed.

## 2013-08-13 NOTE — Assessment & Plan Note (Signed)
A1C 5.3. As discussed with her we can check his A1C yearly. Continue diet control.

## 2013-08-14 ENCOUNTER — Other Ambulatory Visit: Payer: Self-pay | Admitting: Family Medicine

## 2013-08-14 LAB — LIPID PANEL
Cholesterol: 71 mg/dL (ref 0–200)
HDL: 36 mg/dL — ABNORMAL LOW (ref >39)
Total CHOL/HDL Ratio: 2 Ratio
Triglycerides: 192 mg/dL — ABNORMAL HIGH (ref <150)
VLDL: 38 mg/dL (ref 0–40)

## 2013-08-14 LAB — BASIC METABOLIC PANEL
BUN: 14 mg/dL (ref 6–23)
CHLORIDE: 98 meq/L (ref 96–112)
CO2: 31 meq/L (ref 19–32)
Calcium: 9.2 mg/dL (ref 8.4–10.5)
Creat: 1.06 mg/dL (ref 0.50–1.35)
Glucose, Bld: 139 mg/dL — ABNORMAL HIGH (ref 70–99)
Potassium: 3.1 mEq/L — ABNORMAL LOW (ref 3.5–5.3)
Sodium: 142 mEq/L (ref 135–145)

## 2013-08-14 LAB — IRON: Iron: 106 ug/dL (ref 42–165)

## 2013-08-14 LAB — FERRITIN: Ferritin: 40 ng/mL (ref 22–322)

## 2013-08-14 MED ORDER — POTASSIUM CHLORIDE CRYS ER 20 MEQ PO TBCR: 20.0000 meq | EXTENDED_RELEASE_TABLET | ORAL | Status: DC

## 2013-08-18 ENCOUNTER — Other Ambulatory Visit: Payer: Self-pay | Admitting: Family Medicine

## 2013-08-26 ENCOUNTER — Telehealth: Payer: Self-pay | Admitting: *Deleted

## 2013-08-26 NOTE — Telephone Encounter (Signed)
Received fax from [place] office for refill for Crestor 10 mg PO.  Left voice message for pharmacist stating that pt is not on Crestor, now on Liptor and uses [facility] pharmacy now. Per previous MD notes.  Derl Barrow, RN

## 2013-09-03 ENCOUNTER — Ambulatory Visit (INDEPENDENT_AMBULATORY_CARE_PROVIDER_SITE_OTHER): Payer: Medicare Other | Admitting: Family Medicine

## 2013-09-03 ENCOUNTER — Encounter: Payer: Self-pay | Admitting: Family Medicine

## 2013-09-03 ENCOUNTER — Other Ambulatory Visit: Payer: Self-pay | Admitting: Family Medicine

## 2013-09-03 VITALS — BP 153/89 | HR 65 | Temp 97.8°F | Ht 69.0 in | Wt 226.0 lb

## 2013-09-03 DIAGNOSIS — B351 Tinea unguium: Secondary | ICD-10-CM

## 2013-09-03 DIAGNOSIS — Z1211 Encounter for screening for malignant neoplasm of colon: Secondary | ICD-10-CM

## 2013-09-03 DIAGNOSIS — M79644 Pain in right finger(s): Secondary | ICD-10-CM

## 2013-09-03 DIAGNOSIS — I10 Essential (primary) hypertension: Secondary | ICD-10-CM

## 2013-09-03 DIAGNOSIS — M79609 Pain in unspecified limb: Secondary | ICD-10-CM

## 2013-09-03 DIAGNOSIS — M79646 Pain in unspecified finger(s): Secondary | ICD-10-CM

## 2013-09-03 MED ORDER — TRIAMTERENE-HCTZ 75-50 MG PO TABS: 1.0000 | ORAL_TABLET | Freq: Every day | ORAL | Status: DC

## 2013-09-03 NOTE — Assessment & Plan Note (Signed)
Still moderately elevated despite compliance. I increased his Maxzide to 1 tab per day from 1/2 tab per day. Continue home BP check. F/U in 4 wks for reassessment.

## 2013-09-03 NOTE — Assessment & Plan Note (Signed)
Likely need biopsy to confirm. He also need trimming of his nail. I referred him to podiatrist for nail trimming and fungal culture.

## 2013-09-03 NOTE — Progress Notes (Signed)
Subjective:     Patient ID: Patrick [name], male   DOB: 1951-05-27, 63 y.o.   MRN: [id number]  HPI HTN: Here for follow up, he is currently on Maxzide 75/50 mg 1/2 tab daily, Coreg 25 mg BID,Lasix 40 mg qd,Lisinopril 40 mg qd. Finger pain: Here for follow up, he still does not have any improvement of his right index finger pain.  Toe nail problem: [facility] podiatry referral. HM: Never had colonoscopy. He has not been interested in getting it done.  Current Outpatient Prescriptions on File Prior to Visit  Medication Sig Dispense Refill  . Ascorbic Acid (VITAMIN C) 1000 MG tablet Take 2,000 mg by mouth 2 (two) times daily.      Marland Kitchen aspirin 325 MG tablet Take 1 tablet (325 mg total) by mouth daily.  30 tablet  2  . atorvastatin (LIPITOR) 20 MG tablet Take 1 tablet (20 mg total) by mouth daily.  90 tablet  1  . BIOFLAVONOIDS PO Take 1 tablet by mouth 2 (two) times daily.      . carvedilol (COREG) 25 MG tablet Take 1 tablet (25 mg total) by mouth 2 (two) times daily with a meal.  180 tablet  1  . finasteride (PROSCAR) 5 MG tablet Take 1 tablet (5 mg total) by mouth daily.  90 tablet  1  . furosemide (LASIX) 40 MG tablet TAKE 1 TABLET BY MOUTH EVERY DAY  30 tablet  6  . ibuprofen (ADVIL,MOTRIN) 800 MG tablet Take 1 tablet (800 mg total) by mouth every 12 (twelve) hours as needed for fever, headache, mild pain or moderate pain.  180 tablet  1  . lisinopril (PRINIVIL,ZESTRIL) 40 MG tablet Take 1 tablet (40 mg total) by mouth daily.  90 tablet  1  . omega-3 acid ethyl esters (LOVAZA) 1 G capsule Take 2 capsules (2 g total) by mouth 2 (two) times daily.  360 capsule  1  . potassium chloride SA (K-DUR,KLOR-CON) 20 MEQ tablet Take 1 tablet (20 mEq total) by mouth once a week.  4 tablet  3  . traZODone (DESYREL) 150 MG tablet Take 1 tablet (150 mg total) by mouth at bedtime.  30 tablet  4  . Multiple Vitamins-Minerals (MULTIVITAMIN WITH MINERALS) tablet Take 1 tablet by mouth daily.       No current  facility-administered medications on file prior to visit.   Past Medical History  Diagnosis Date  . CHF (congestive heart failure)   . Hypertension   . DM II (diabetes mellitus, type II), controlled     Diet controlled.   . Hyperlipidemia       Review of Systems  Respiratory: Negative.   Cardiovascular: Negative.   Gastrointestinal: Negative.   Genitourinary: Negative.   Musculoskeletal: Positive for arthralgias.  All other systems reviewed and are negative.   Filed Vitals:   09/03/13 1130 09/03/13 1155 09/03/13 1157  BP: 159/88 158/91 153/89  Pulse: 65    Temp: 97.8 F (36.6 C)    TempSrc: Oral    Height: 5\' 9"  (1.753 m)    Weight: 226 lb (102.513 kg)         Objective:   Physical Exam  Nursing note and vitals reviewed. Constitutional: He appears well-developed. No distress.  Cardiovascular: Normal rate, regular rhythm, normal heart sounds and intact distal pulses.   No murmur heard. Pulmonary/Chest: Effort normal and breath sounds normal. No respiratory distress. He has no wheezes.  Abdominal: Soft. Bowel sounds are normal. He exhibits no distension  and no mass. There is no tenderness.  Musculoskeletal:       Right hand: Normal.       Left hand: Normal.  Skin:  Deformed yellowish thickened toe nail on his right 1st-3rd toes and his 2nd left toe.       Assessment:     HTN Finger pain  Onychomycosis Colon cancer screening     Plan:     Check problem list.

## 2013-09-03 NOTE — Assessment & Plan Note (Signed)
Counseling done on need for colon cancer screening. Patient will like to discuss various option with gastroenterologist. I referred him to GI.

## 2013-09-03 NOTE — Patient Instructions (Addendum)
Patrick [name], your blood pressure is still slightly elevated, I think we can go up on your Maxzide from 1/2 pill daily to 1 pill daily. Continue other BP medication and please try to check you BP atleast 1-2 times per week. Call if less than 100/60 or more than 160/90   Managing Your High Blood Pressure Blood pressure is a measurement of how forceful your blood is pressing against the walls of the arteries. Arteries are muscular tubes within the circulatory system. Blood pressure does not stay the same. Blood pressure rises when you are active, excited, or nervous; and it lowers during sleep and relaxation. If the numbers measuring your blood pressure stay above normal most of the time, you are at risk for health problems. High blood pressure (hypertension) is a long-term (chronic) condition in which blood pressure is elevated. A blood pressure reading is recorded as two numbers, such as 120 over 80 (or 120/80). The first, higher number is called the systolic pressure. It is a measure of the pressure in your arteries as the heart beats. The second, lower number is called the diastolic pressure. It is a measure of the pressure in your arteries as the heart relaxes between beats.  Keeping your blood pressure in a normal range is important to your overall health and prevention of health problems, such as heart disease and stroke. When your blood pressure is uncontrolled, your heart has to work harder than normal. High blood pressure is a very common condition in adults because blood pressure tends to rise with age. Men and women are equally likely to have hypertension but at different times in life. Before age 28, men are more likely to have hypertension. After 65 years of age, women are more likely to have it. Hypertension is especially common in African Americans. This condition often has no signs or symptoms. The cause of the condition is usually not known. Your caregiver can help you come up with a plan to keep  your blood pressure in a normal, healthy range. BLOOD PRESSURE STAGES Blood pressure is classified into four stages: normal, prehypertension, stage 1, and stage 2. Your blood pressure reading will be used to determine what type of treatment, if any, is necessary. Appropriate treatment options are tied to these four stages:  Normal  Systolic pressure (mm Hg): below 120.  Diastolic pressure (mm Hg): below 80. Prehypertension  Systolic pressure (mm Hg): 120 to 139.  Diastolic pressure (mm Hg): 80 to 89. Stage1  Systolic pressure (mm Hg): 140 to 159.  Diastolic pressure (mm Hg): 90 to 99. Stage2  Systolic pressure (mm Hg): 160 or above.  Diastolic pressure (mm Hg): 100 or above. RISKS RELATED TO HIGH BLOOD PRESSURE Managing your blood pressure is an important responsibility. Uncontrolled high blood pressure can lead to:  A heart attack.  A stroke.  A weakened blood vessel (aneurysm).  Heart failure.  Kidney damage.  Eye damage.  Metabolic syndrome.  Memory and concentration problems. HOW TO MANAGE YOUR BLOOD PRESSURE Blood pressure can be managed effectively with lifestyle changes and medicines (if needed). Your caregiver will help you come up with a plan to bring your blood pressure within a normal range. Your plan should include the following: Education  Read all information provided by your caregivers about how to control blood pressure.  Educate yourself on the latest guidelines and treatment recommendations. New research is always being done to further define the risks and treatments for high blood pressure. Lifestylechanges  Control your weight.  Avoid smoking.  Stay physically active.  Reduce the amount of salt in your diet.  Reduce stress.  Control any chronic conditions, such as high cholesterol or diabetes.  Reduce your alcohol intake. Medicines  Several medicines (antihypertensive medicines) are available, if needed, to bring blood pressure  within a normal range. Communication  Review all the medicines you take with your caregiver because there may be side effects or interactions.  Talk with your caregiver about your diet, exercise habits, and other lifestyle factors that may be contributing to high blood pressure.  See your caregiver regularly. Your caregiver can help you create and adjust your plan for managing high blood pressure. RECOMMENDATIONS FOR TREATMENT AND FOLLOW-UP  The following recommendations are based on current guidelines for managing high blood pressure in nonpregnant adults. Use these recommendations to identify the proper follow-up period or treatment option based on your blood pressure reading. You can discuss these options with your caregiver.  Systolic pressure of 081 to 448 or diastolic pressure of 80 to 89: Follow up with your caregiver as directed.  Systolic pressure of 185 to 631 or diastolic pressure of 90 to 100: Follow up with your caregiver within 2 months.  Systolic pressure above 497 or diastolic pressure above 026: Follow up with your caregiver within 1 month.  Systolic pressure above 378 or diastolic pressure above 588: Consider antihypertensive therapy; follow up with your caregiver within 1 week.  Systolic pressure above 502 or diastolic pressure above 774: Begin antihypertensive therapy; follow up with your caregiver within 1 week. Document Released: 02/08/2012 Document Reviewed: 02/08/2012 [facility] Patient Information 2014 [place], [place].

## 2013-09-03 NOTE — Assessment & Plan Note (Addendum)
Continue current pain regimen. Xray of his Finger ordered to r/o OA RF and ANA negative. Continue Ibuprofen prn pain.

## 2013-09-12 ENCOUNTER — Other Ambulatory Visit: Payer: Self-pay | Admitting: Family Medicine

## 2013-09-25 ENCOUNTER — Ambulatory Visit (INDEPENDENT_AMBULATORY_CARE_PROVIDER_SITE_OTHER): Payer: Medicare Other

## 2013-09-25 VITALS — Ht 69.0 in | Wt 228.0 lb

## 2013-09-25 DIAGNOSIS — E1149 Type 2 diabetes mellitus with other diabetic neurological complication: Secondary | ICD-10-CM

## 2013-09-25 DIAGNOSIS — L608 Other nail disorders: Secondary | ICD-10-CM

## 2013-09-25 DIAGNOSIS — M79609 Pain in unspecified limb: Secondary | ICD-10-CM

## 2013-09-25 DIAGNOSIS — E1140 Type 2 diabetes mellitus with diabetic neuropathy, unspecified: Secondary | ICD-10-CM

## 2013-09-25 DIAGNOSIS — S90129A Contusion of unspecified lesser toe(s) without damage to nail, initial encounter: Secondary | ICD-10-CM

## 2013-09-25 DIAGNOSIS — E1142 Type 2 diabetes mellitus with diabetic polyneuropathy: Secondary | ICD-10-CM

## 2013-09-25 DIAGNOSIS — B351 Tinea unguium: Secondary | ICD-10-CM

## 2013-09-25 MED ORDER — CEPHALEXIN 500 MG PO CAPS: 500.0000 mg | ORAL_CAPSULE | Freq: Three times a day (TID) | ORAL | Status: DC

## 2013-09-25 NOTE — Progress Notes (Signed)
   Subjective:    Patient ID: Patrick [name], male    DOB: 04-Nov-1950, 63 y.o.   MRN: [id number]  HPI Comments: N debridement L 1 - 10 toenails, and left 2nd toenail area is bleeding D long-term O history of right 2nd toenail catching on some object C thick, elongated, curling toenails     Left 2 toenail is loose, and has blood around the area A diabetes, multiple hip and back injuries, and difficulty cutting T hx of debridements at [facility]     Review of Systems  All other systems reviewed and are negative.      Objective:   Physical Exam 63 year old white male process this time well-developed well-nourished oriented x3 with complaints of a trauma to his second toe left foot started bleeding this morning every can shower my above. Toe pulses thick deformed criptotic nails have not been cut for more than a year to 2 years. Does have history of diabetes and complications abnormal sensations. Lower extremity objective findings as follows vascular status is intact pedal pulses palpable DP pulse two over four bilateral PT one over 4 bilateral capillary refill time 3 seconds all digits epicritic sensations intact and symmetrical decreased on Semmes Weinstein testing to forefoot digits and plantar arch bilateral. Neurologically skin color pigment normal hair growth absent nails thick brittle crumbly friable criptotic with discoloration and friability tenderness hallux through fifth right hallux second and third left. These nails are painful tender with enclosed shoe activities and ambulation does have diabetic shoes although worn need replacing at some point in the future. Patient orthopedic exam reveals digital contractures HAV deformity bilateral no x-rays taken at this time no open wounds ulcerations or dried blood second toe nailbed nails extremely thick criptotic discolored over an inch long and have been cut likely a year to 2 years.       Assessment & Plan:  Assessment #1 is contusion  with onychomycosis second toe left foot with partial nail avulsion. Plan at this time local anesthetic blocks Mr. Betadine prep performed the nail plate is avulsed per patient request my recommendation of phenol matricectomy is carried out to prevent any future recurrence of deformity gratified nail. Use of phenol followed Betadine ointment and dressed with dressing reapplied will initiate daily soaking Betadine warm water Neosporin and Band-Aid dressings prescription for cephalexin is also furnished as a precaution prevent infection. 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 for diabetic shoes and future palliative nail care and as-needed basis  Harriet Masson DPM

## 2013-09-25 NOTE — Patient Instructions (Addendum)
Betadine Soak Instructions  Purchase an 8 oz. bottle of BETADINE solution (Povidone)  THE DAY AFTER THE PROCEDURE  Place 1 tablespoon of betadine solution in a quart of warm tap water.  Submerge your foot or feet with outer bandage intact for the initial soak; this will allow the bandage to become moist and wet for easy lift off.  Once you remove your bandage, continue to soak in the solution for 20 minutes.  This soak should be done twice a day.  Next, remove your foot or feet from solution, blot dry the affected area and cover.  You may use a band aid large enough to cover the area or use gauze and tape.  Apply other medications to the area as directed by the doctor such as cortisporin otic solution (ear drops) or neosporin. Apply Neosporin and Band-Aid dressing after soaks daily Betadine or Epson salt soaks need to be used for at least 2 weeks' duration  IF YOUR SKIN BECOMES IRRITATED WHILE USING THESE INSTRUCTIONS, IT IS OKAY TO SWITCH TO EPSOM SALTS AND WATER OR WHITE VINEGAR AND WATER.  For pain recommended Tylenol take 2 tablets 3 times daily as needed for pain

## 2013-09-27 ENCOUNTER — Telehealth: Payer: Self-pay | Admitting: *Deleted

## 2013-09-27 NOTE — Telephone Encounter (Signed)
Had toenail removed Wednesday.  Confused on the treatment of how to take care of it.  I returned his call.  He stated he's been soaking in the Betadine, that's going to get really expensive for him.  He asked if he can switch to [facility].  I informed him that is fine.

## 2013-10-01 ENCOUNTER — Ambulatory Visit (HOSPITAL_COMMUNITY)
Admission: RE | Admit: 2013-10-01 | Discharge: 2013-10-01 | Disposition: A | Discharge status: Home / Self Care | Payer: Medicare Other | Source: Ambulatory Visit | Attending: Family Medicine | Admitting: Family Medicine

## 2013-10-01 DIAGNOSIS — M79644 Pain in right finger(s): Secondary | ICD-10-CM

## 2013-10-01 DIAGNOSIS — M79609 Pain in unspecified limb: Secondary | ICD-10-CM | POA: Insufficient documentation

## 2013-10-02 ENCOUNTER — Telehealth: Payer: Self-pay | Admitting: Family Medicine

## 2013-10-02 ENCOUNTER — Encounter: Payer: Self-pay | Admitting: Family Medicine

## 2013-10-02 NOTE — Telephone Encounter (Signed)
I called to discuss his finger xray report which showed mild arthritis and incidental calcification. As discussed with patient, I spoke directly with the radiologist; Dr Radford Pax who read the report, she stated calcification looked benign,not suspicious for malignancy and no further imaging warranted at this time. Patient verbalized understanding. Plan to treat his pain with Tylenol as needed.

## 2013-10-03 ENCOUNTER — Encounter: Payer: Self-pay | Admitting: Family Medicine

## 2013-10-03 ENCOUNTER — Ambulatory Visit (INDEPENDENT_AMBULATORY_CARE_PROVIDER_SITE_OTHER): Payer: Medicare Other | Admitting: Family Medicine

## 2013-10-03 VITALS — BP 122/73 | HR 58 | Temp 97.8°F | Wt 219.0 lb

## 2013-10-03 DIAGNOSIS — E669 Obesity, unspecified: Secondary | ICD-10-CM | POA: Insufficient documentation

## 2013-10-03 DIAGNOSIS — Z1211 Encounter for screening for malignant neoplasm of colon: Secondary | ICD-10-CM

## 2013-10-03 DIAGNOSIS — L989 Disorder of the skin and subcutaneous tissue, unspecified: Secondary | ICD-10-CM | POA: Insufficient documentation

## 2013-10-03 DIAGNOSIS — I10 Essential (primary) hypertension: Secondary | ICD-10-CM

## 2013-10-03 DIAGNOSIS — M79646 Pain in unspecified finger(s): Secondary | ICD-10-CM

## 2013-10-03 DIAGNOSIS — M79609 Pain in unspecified limb: Secondary | ICD-10-CM

## 2013-10-03 DIAGNOSIS — B351 Tinea unguium: Secondary | ICD-10-CM

## 2013-10-03 DIAGNOSIS — M25569 Pain in unspecified knee: Secondary | ICD-10-CM

## 2013-10-03 MED ORDER — TRAMADOL HCL 50 MG PO TABS: 50.0000 mg | ORAL_TABLET | Freq: Three times a day (TID) | ORAL | Status: DC | PRN

## 2013-10-03 NOTE — Assessment & Plan Note (Signed)
BMI improved from 33.6 to 32.3 Intentional weight loss. Patient commended on his effort.

## 2013-10-03 NOTE — Assessment & Plan Note (Signed)
Finger xray discussed with patient. Mild DJD. Continue Ibuprofen prn alternating with Tramadol prn depending on which helps better.

## 2013-10-03 NOTE — Assessment & Plan Note (Addendum)
R/O malignant lesion. Plan to schedule follow up at the dermatology clinic for biopsy.

## 2013-10-03 NOTE — Assessment & Plan Note (Signed)
Patient refused to go for his GI appointment for colon cancer screening. I gave him various option of testing including stool testing. He stated he does not know why but he just Phelan't want to get screened. I will readdress this at his next visit.

## 2013-10-03 NOTE — Progress Notes (Signed)
Subjective:     Patient ID: Patrick [name], male   DOB: 1951-05-01, 63 y.o.   MRN: [id number]  HPI HTN: Here for follow up after dose adjustment from last visit, he denies any concern,he feels well. Onychomycosis: Seen by podiatrist recently who did nail trimming and removal, he feels very good now. Finger/Knee pain: Still having finger pain,maily her right finger and occasional right knee pain. Ibuprofen does not help him much,he is requesting for a better medication for his pain. Skin lesion: C/O raised bump on his left foot that has been there for about a year, he is not sure how it started, he denies any pain or injury over the lesion. Weight management: Working on losing weight by cutting back on his diet and exercise. NW:GNFAOZH stated he was contacted by the GI clinic to schedule appointment for colon cancer screening, he told them to not schedule his appointment since he is not ready for colon cancer screening. He stated he just does not want to get screened.  Current Outpatient Prescriptions on File Prior to Visit  Medication Sig Dispense Refill  . Ascorbic Acid (VITAMIN C) 1000 MG tablet Take 2,000 mg by mouth 2 (two) times daily.      Marland Kitchen aspirin 325 MG tablet Take 1 tablet (325 mg total) by mouth daily.  30 tablet  2  . atorvastatin (LIPITOR) 20 MG tablet Take 1 tablet (20 mg total) by mouth daily.  90 tablet  1  . BIOFLAVONOIDS PO Take 1 tablet by mouth 2 (two) times daily.      . carvedilol (COREG) 25 MG tablet TAKE 1 TABLET BY MOUTH TWICE DAILY  180 tablet  1  . cephALEXin (KEFLEX) 500 MG capsule Take 1 capsule (500 mg total) by mouth 3 (three) times daily.  30 capsule  0  . finasteride (PROSCAR) 5 MG tablet Take 1 tablet (5 mg total) by mouth daily.  90 tablet  1  . furosemide (LASIX) 40 MG tablet TAKE 1 TABLET BY MOUTH EVERY DAY  30 tablet  6  . ibuprofen (ADVIL,MOTRIN) 800 MG tablet Take 1 tablet (800 mg total) by mouth every 12 (twelve) hours as needed for fever, headache, mild pain  or moderate pain.  180 tablet  1  . lisinopril (PRINIVIL,ZESTRIL) 40 MG tablet Take 1 tablet (40 mg total) by mouth daily.  90 tablet  1  . Multiple Vitamins-Minerals (MULTIVITAMIN WITH MINERALS) tablet Take 1 tablet by mouth daily.      Marland Kitchen omega-3 acid ethyl esters (LOVAZA) 1 G capsule Take 2 capsules (2 g total) by mouth 2 (two) times daily.  360 capsule  1  . potassium chloride SA (K-DUR,KLOR-CON) 20 MEQ tablet Take 1 tablet (20 mEq total) by mouth once a week.  4 tablet  3  . traZODone (DESYREL) 150 MG tablet TAKE 1 TABLET BY MOUTH EVERY NIGHT AT BEDTIME  90 tablet  1  . triamterene-hydrochlorothiazide (MAXZIDE) 75-50 MG per tablet Take 1 tablet by mouth daily.  90 tablet  1   No current facility-administered medications on file prior to visit.   Past Medical History  Diagnosis Date  . CHF (congestive heart failure)   . Hypertension   . DM II (diabetes mellitus, type II), controlled     Diet controlled.   . Hyperlipidemia   . Degenerative joint disease of knee, right        Review of Systems  Constitutional: Negative for fatigue.       Intentional weight loss.  Respiratory: Negative.   Cardiovascular: Negative.   Gastrointestinal: Negative.   Genitourinary: Negative.   Musculoskeletal: Positive for arthralgias. Negative for joint swelling.  Skin:       Skin lesion.  Psychiatric/Behavioral: Negative.   All other systems reviewed and are negative.      Objective:   Physical Exam  Nursing note and vitals reviewed. Constitutional: He is oriented to person, place, and time. He appears well-developed. No distress.  Eyes: Pupils are equal, round, and reactive to light.  Neck: Neck supple.  Cardiovascular: Normal rate, regular rhythm, normal heart sounds and intact distal pulses.   No murmur heard. Pulmonary/Chest: Effort normal and breath sounds normal. No respiratory distress. He has no wheezes.  Abdominal: Soft. Bowel sounds are normal. He exhibits no distension and no  mass. There is no tenderness.  Musculoskeletal: Normal range of motion. He exhibits no edema.  Nail looks trimmed and cleaner.  Neurological: He is alert and oriented to person, place, and time.  Skin:          Assessment:     HTN Onychomycosis FInger pain Knee pain  Skin lesion:  Obesity Colon cancer screening    Plan:     Check problem list.

## 2013-10-03 NOTE — Assessment & Plan Note (Signed)
Improved some. Xray reviewed which showed mild DJD. Continue pain meds prn and home exercise as well as weigh loss. He is doing a great job with his intentional weight loss.

## 2013-10-03 NOTE — Assessment & Plan Note (Signed)
S/P toe nail removal and treatment by podiatrist. I will defer further management to the podiatrist.

## 2013-10-03 NOTE — Patient Instructions (Addendum)
It was nice seeing you today,your blood pressure looks good, please keep checking BP at home, if less than 100/65 please give me a call. I will also like you to schedule appointment for your skin lesion in our dermatology clinic.   Colonoscopy A colonoscopy is an exam to look at the entire large intestine (colon). This exam can help find problems such as tumors, polyps, inflammation, and areas of bleeding. The exam takes about 1 hour.  LET [facility] CARE PROVIDER KNOW ABOUT:   Any allergies you have.  All medicines you are taking, including vitamins, herbs, eye drops, creams, and over-the-counter medicines.  Previous problems you or members of your family have had with the use of anesthetics.  Any blood disorders you have.  Previous surgeries you have had.  Medical conditions you have. RISKS AND COMPLICATIONS  Generally, this is a safe procedure. However, as with any procedure, complications can occur. Possible complications include:  Bleeding.  Tearing or rupture of the colon wall.  Reaction to medicines given during the exam.  Infection (rare). BEFORE THE PROCEDURE   Ask your health care provider about changing or stopping your regular medicines.  You may be prescribed an oral bowel prep. This involves drinking a large amount of medicated liquid, starting the day before your procedure. The liquid will cause you to have multiple loose stools until your stool is almost clear or light green. This cleans out your colon in preparation for the procedure.  Do not eat or drink anything else once you have started the bowel prep, unless your health care provider tells you it is safe to do so.  Arrange for someone to drive you home after the procedure. PROCEDURE   You will be given medicine to help you relax (sedative).  You will lie on your side with your knees bent.  A long, flexible tube with a light and camera on the end (colonoscope) will be inserted through the rectum and  into the colon. The camera sends video back to a computer screen as it moves through the colon. The colonoscope also releases carbon dioxide gas to inflate the colon. This helps your health care provider see the area better.  During the exam, your health care provider may take a small tissue sample (biopsy) to be examined under a microscope if any abnormalities are found.  The exam is finished when the entire colon has been viewed. AFTER THE PROCEDURE   Do not drive for 24 hours after the exam.  You may have a small amount of blood in your stool.  You may pass moderate amounts of gas and have mild abdominal cramping or bloating. This is caused by the gas used to inflate your colon during the exam.  Ask when your test results will be ready and how you will get your results. Make sure you get your test results. Document Released: 05/13/2000 Document Revised: 03/06/2013 Document Reviewed: 01/21/2013 [facility] Patient Information 2014 [place].

## 2013-10-03 NOTE — Assessment & Plan Note (Signed)
BP improved, actually low normal. Patient denies any hypotensive symptoms. Instructed to check his BP once a week, to call if less than 100/65. Continue current BP regimen. F/U in 2 months.

## 2013-10-16 ENCOUNTER — Ambulatory Visit (INDEPENDENT_AMBULATORY_CARE_PROVIDER_SITE_OTHER): Payer: Medicare Other

## 2013-10-16 VITALS — BP 158/79 | HR 58 | Resp 18

## 2013-10-16 DIAGNOSIS — Z09 Encounter for follow-up examination after completed treatment for conditions other than malignant neoplasm: Secondary | ICD-10-CM

## 2013-10-16 DIAGNOSIS — B351 Tinea unguium: Secondary | ICD-10-CM

## 2013-10-16 NOTE — Progress Notes (Signed)
   Subjective:    Patient ID: ANASTASIOS MELANDER, male    DOB: [birth year], 63 y.o.   MRN: [id number]  HPI I had a toenail done and it is scabbing over and I hope it looks good    Review of Systems no systemic changes or findings noted     Objective:   Physical Exam Neurovascular status is intact pedal pulses are palpable DP +2/4 bilateral PT one over 4 eschar over the second digit left foot following AP nail procedure with excision of mycotic nail remaining nails show crepitus is discoloration and friability consistent with onychomycosis patient also has diabetes with neuropathy is scheduled to 3 months for palliative nail care on remaining nails the avulsed nail is healed well mild eschar noted no sign of infection no discharge drainage no pain or discomfort noted       Assessment & Plan:  Assessment good postoperative following AP nail procedure left second toe discharge to an as-needed basis for nail followup suggest 3 month followup for mycotic and diabetic foot nail care next  Harriet Masson DPM

## 2013-10-16 NOTE — Patient Instructions (Signed)

## 2013-10-23 ENCOUNTER — Ambulatory Visit (INDEPENDENT_AMBULATORY_CARE_PROVIDER_SITE_OTHER): Payer: Medicare Other | Admitting: Family Medicine

## 2013-10-23 VITALS — BP 123/77 | HR 56 | Temp 98.1°F | Wt 221.0 lb

## 2013-10-23 DIAGNOSIS — L989 Disorder of the skin and subcutaneous tissue, unspecified: Secondary | ICD-10-CM

## 2013-10-23 NOTE — Patient Instructions (Signed)
Wound Care Wound care helps prevent pain and infection.  You may need a tetanus shot if:  You cannot remember when you had your last tetanus shot.  You have never had a tetanus shot.  The injury broke your skin. If you need a tetanus shot and you choose not to have one, you may get tetanus. Sickness from tetanus can be serious. HOME CARE   Only take medicine as told by your doctor.  Clean the wound daily with mild soap and water.  Change any bandages (dressings) as told by your doctor.  Put medicated cream and a bandage on the wound as told by your doctor.  Change the bandage if it gets wet, dirty, or starts to smell.  Take showers. Do not take baths, swim, or do anything that puts your wound under water.  Rest and raise (elevate) the wound until the pain and puffiness (swelling) are better.  Keep all doctor visits as told. GET HELP RIGHT AWAY IF:   Yellowish-white fluid (pus) comes from the wound.  Medicine does not lessen your pain.  There is a red streak going away from the wound.  You have a fever. MAKE SURE YOU:   Understand these instructions.  Will watch your condition.  Will get help right away if you are not doing well or get worse. Document Released: 02/23/2008 Document Revised: 08/08/2011 Document Reviewed: 09/19/2010 [employer] Patient Information 2014 [employer].  

## 2013-10-23 NOTE — Progress Notes (Signed)
Subjective:     Patient ID: Patrick [name], male   DOB: June 22, 1950, 63 y.o.   MRN: [id number]  HPI Skin lesion: 1 skin lesion present on top of left ankle, 0.5 cm x 0.5 cm raised firm painless lesion. Patient cannot recall any injury, trauma, or burn to the area. Does not know how long it has been present, but thinks years. Not itchy or painful. Has not changed in recent past. No personal history of cancer. Family history of cancer - father with prostate cancer, no skin cancer in family. No personal history of outdoor jobs. No history of bad sunburns to this area. No moles removed before. No personal history of skin biopsy. Denies fever, chills, unintentional weight loss.  Current Outpatient Prescriptions on File Prior to Visit  Medication Sig Dispense Refill  . Ascorbic Acid (VITAMIN C) 1000 MG tablet Take 2,000 mg by mouth 2 (two) times daily.      Marland Kitchen aspirin 325 MG tablet Take 1 tablet (325 mg total) by mouth daily.  30 tablet  2  . atorvastatin (LIPITOR) 20 MG tablet Take 1 tablet (20 mg total) by mouth daily.  90 tablet  1  . BIOFLAVONOIDS PO Take 1 tablet by mouth 2 (two) times daily.      . carvedilol (COREG) 25 MG tablet TAKE 1 TABLET BY MOUTH TWICE DAILY  180 tablet  1  . cephALEXin (KEFLEX) 500 MG capsule Take 1 capsule (500 mg total) by mouth 3 (three) times daily.  30 capsule  0  . finasteride (PROSCAR) 5 MG tablet Take 1 tablet (5 mg total) by mouth daily.  90 tablet  1  . furosemide (LASIX) 40 MG tablet TAKE 1 TABLET BY MOUTH EVERY DAY  30 tablet  6  . ibuprofen (ADVIL,MOTRIN) 800 MG tablet Take 1 tablet (800 mg total) by mouth every 12 (twelve) hours as needed for fever, headache, mild pain or moderate pain.  180 tablet  1  . lisinopril (PRINIVIL,ZESTRIL) 40 MG tablet Take 1 tablet (40 mg total) by mouth daily.  90 tablet  1  . Multiple Vitamins-Minerals (MULTIVITAMIN WITH MINERALS) tablet Take 1 tablet by mouth daily.      Marland Kitchen omega-3 acid ethyl esters (LOVAZA) 1 G capsule Take 2 capsules  (2 g total) by mouth 2 (two) times daily.  360 capsule  1  . potassium chloride SA (K-DUR,KLOR-CON) 20 MEQ tablet Take 1 tablet (20 mEq total) by mouth once a week.  4 tablet  3  . traMADol (ULTRAM) 50 MG tablet Take 1 tablet (50 mg total) by mouth every 8 (eight) hours as needed.  90 tablet  3  . traZODone (DESYREL) 150 MG tablet TAKE 1 TABLET BY MOUTH EVERY NIGHT AT BEDTIME  90 tablet  1  . triamterene-hydrochlorothiazide (MAXZIDE) 75-50 MG per tablet Take 1 tablet by mouth daily.  90 tablet  1   No current facility-administered medications on file prior to visit.   Past Medical History  Diagnosis Date  . CHF (congestive heart failure)   . Hypertension   . DM II (diabetes mellitus, type II), controlled     Diet controlled.   . Hyperlipidemia   . Degenerative joint disease of knee, right      Review of Systems  Respiratory: Negative.   Cardiovascular: Negative.   Skin:       Skin lesion  All other systems reviewed and are negative.  Filed Vitals:   10/23/13 1423  BP: 123/77  Pulse: 56  Temp: 98.1 F (36.7 C)  TempSrc: Oral  Weight: 221 lb (100.245 kg)       Objective:   Physical Exam  Nursing note and vitals reviewed. Skin:          Assessment:     Skin lesion     Plan:     Check problem list.

## 2013-10-23 NOTE — Assessment & Plan Note (Addendum)
Lesion likely benign. Biopsy recommended to r/o malignancy. Informed written and verbal consent given. Lesion well cleaned. Procedure done under sterile condition. 33mm punch biopsy instrument used to obtain specimen after injecting 2% Lidocaine with epi. Procedure was with no major complication, very small bleed which was stopped by compression. Wound dressing done. Wound care instruction was given to patient. F/U in 1 wk for reassessment and to discuss pathology report.

## 2013-10-25 ENCOUNTER — Telehealth: Payer: Self-pay | Admitting: Family Medicine

## 2013-10-25 NOTE — Telephone Encounter (Signed)
Message left about biopsy report, no cancer.

## 2013-10-30 ENCOUNTER — Ambulatory Visit (INDEPENDENT_AMBULATORY_CARE_PROVIDER_SITE_OTHER): Payer: Medicare Other | Admitting: Family Medicine

## 2013-10-30 ENCOUNTER — Encounter: Payer: Self-pay | Admitting: Family Medicine

## 2013-10-30 VITALS — BP 167/90 | HR 56 | Temp 98.0°F | Ht 69.0 in | Wt 223.0 lb

## 2013-10-30 DIAGNOSIS — L989 Disorder of the skin and subcutaneous tissue, unspecified: Secondary | ICD-10-CM

## 2013-10-30 DIAGNOSIS — I10 Essential (primary) hypertension: Secondary | ICD-10-CM

## 2013-10-30 NOTE — Progress Notes (Signed)
Patient ID: Patrick [name], male   DOB: 08-22-1950, 63 y.o.   MRN: [id number]   Subjective:    Patient ID: Patrick [name], male    DOB: 11-23-1950, 63 y.o.   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 Objective:  BP 167/90  Pulse 56  Temp(Src) 98 F (36.7 C) (Oral)  Ht 5\' 9"  (1.753 m)  Wt 223 lb (101.152 kg)  BMI 32.92 kg/m2  General: NAD Cardiac: RRR, normal heart sounds, no murmurs. 2+ radial and PT pulses bilaterally Respiratory: CTAB, normal effort Extremities: no edema or cyanosis. WWP. Skin: warm and dry, no rashes noted. Well healed punch biopsy of left anterior ankle (see picture below). No fluctuance, feels slightly indurated (as if it is a scar). Neuro: alert and oriented, no focal deficits       Assessment & Plan:  See Problem List Documentation

## 2013-10-30 NOTE — Assessment & Plan Note (Signed)
BP above goal today in office. Appears to be almost maximized on current BP medications (coreg, lasix, lisinopril, triamterene-hctz). Plan for f/u in about 1 month with PCP. May consider 24hr BP monitoring through Dr. Valentina Lucks or initial workup for primary hyperaldosteronism, renal artery stenosis.

## 2013-10-30 NOTE — Patient Instructions (Signed)
It was nice to meet you today.  Your biopsy site looks great.  Follow up in 1 month for your blood pressure with Dr. Gwendlyn Deutscher

## 2013-10-30 NOTE — Assessment & Plan Note (Signed)
Well healed on exam today, no evidence of infection. 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 No further follow up scheduled.

## 2013-11-04 ENCOUNTER — Other Ambulatory Visit: Payer: Self-pay | Admitting: Family Medicine

## 2013-12-03 ENCOUNTER — Encounter: Payer: Self-pay | Admitting: Family Medicine

## 2013-12-03 ENCOUNTER — Ambulatory Visit (INDEPENDENT_AMBULATORY_CARE_PROVIDER_SITE_OTHER): Payer: Commercial Managed Care - HMO | Admitting: Family Medicine

## 2013-12-03 VITALS — BP 143/82 | HR 61 | Temp 98.2°F | Wt 225.0 lb

## 2013-12-03 DIAGNOSIS — E876 Hypokalemia: Secondary | ICD-10-CM

## 2013-12-03 DIAGNOSIS — I10 Essential (primary) hypertension: Secondary | ICD-10-CM

## 2013-12-03 DIAGNOSIS — E785 Hyperlipidemia, unspecified: Secondary | ICD-10-CM

## 2013-12-03 LAB — BASIC METABOLIC PANEL
BUN: 23 mg/dL (ref 6–23)
CO2: 29 mEq/L (ref 19–32)
CREATININE: 1.33 mg/dL (ref 0.50–1.35)
Calcium: 8.7 mg/dL (ref 8.4–10.5)
Chloride: 101 mEq/L (ref 96–112)
Glucose, Bld: 147 mg/dL — ABNORMAL HIGH (ref 70–99)
POTASSIUM: 3.1 meq/L — AB (ref 3.5–5.3)
Sodium: 140 mEq/L (ref 135–145)

## 2013-12-03 LAB — LDL CHOLESTEROL, DIRECT: Direct LDL: 23 mg/dL

## 2013-12-03 NOTE — Patient Instructions (Signed)

## 2013-12-03 NOTE — Assessment & Plan Note (Signed)
On potassium replacement. BMP ordered to check his K+ level today. I will follow up with test result.

## 2013-12-03 NOTE — Assessment & Plan Note (Signed)
Compliant with medication. He need LDL rechecked. LDL ordered today. I will follow up with test result.

## 2013-12-03 NOTE — Progress Notes (Signed)
Subjective:     Patient ID: Patrick [name], male   DOB: 1950-06-28, 63 y.o.   MRN: [id number]  HPI HTN/HLD:Compliant with his medications,here for follow up. Hypokalemia: On potassium supplement weekly, here for K+ check.  Current Outpatient Prescriptions on File Prior to Visit  Medication Sig Dispense Refill  . Ascorbic Acid (VITAMIN C) 1000 MG tablet Take 2,000 mg by mouth 2 (two) times daily.      Marland Kitchen aspirin 325 MG tablet Take 1 tablet (325 mg total) by mouth daily.  30 tablet  2  . atorvastatin (LIPITOR) 20 MG tablet Take 1 tablet (20 mg total) by mouth daily.  90 tablet  1  . BIOFLAVONOIDS PO Take 1 tablet by mouth 2 (two) times daily.      . carvedilol (COREG) 25 MG tablet TAKE 1 TABLET BY MOUTH TWICE DAILY  180 tablet  1  . finasteride (PROSCAR) 5 MG tablet Take 1 tablet (5 mg total) by mouth daily.  90 tablet  1  . furosemide (LASIX) 40 MG tablet TAKE 1 TABLET BY MOUTH EVERY DAY  30 tablet  6  . ibuprofen (ADVIL,MOTRIN) 800 MG tablet TAKE 1 TABLET BY MOUTH EVERY 12 HOURS AS NEEDED FOR FEVER, HEADACHE, MILD PAIN, OR MODERATE PAIN  90 tablet  1  . lisinopril (PRINIVIL,ZESTRIL) 40 MG tablet Take 1 tablet (40 mg total) by mouth daily.  90 tablet  1  . Multiple Vitamins-Minerals (MULTIVITAMIN WITH MINERALS) tablet Take 1 tablet by mouth daily.      Marland Kitchen omega-3 acid ethyl esters (LOVAZA) 1 G capsule Take 2 capsules (2 g total) by mouth 2 (two) times daily.  360 capsule  1  . potassium chloride SA (K-DUR,KLOR-CON) 20 MEQ tablet Take 1 tablet (20 mEq total) by mouth once a week.  4 tablet  3  . traMADol (ULTRAM) 50 MG tablet Take 1 tablet (50 mg total) by mouth every 8 (eight) hours as needed.  90 tablet  3  . traZODone (DESYREL) 150 MG tablet TAKE 1 TABLET BY MOUTH EVERY NIGHT AT BEDTIME  90 tablet  1  . triamterene-hydrochlorothiazide (MAXZIDE) 75-50 MG per tablet Take 1 tablet by mouth daily.  90 tablet  1   No current facility-administered medications on file prior to visit.   Past Medical  History  Diagnosis Date  . CHF (congestive heart failure)   . Hypertension   . DM II (diabetes mellitus, type II), controlled     Diet controlled.   . Hyperlipidemia   . Degenerative joint disease of knee, right      Review of Systems  Respiratory: Negative.   Cardiovascular: Negative.   Gastrointestinal: Negative.   Genitourinary: Negative.   Musculoskeletal: Negative.   All other systems reviewed and are negative.  Filed Vitals:   12/03/13 1143  BP: 143/82  Pulse: 61  Temp: 98.2 F (36.8 C)  TempSrc: Oral  Weight: 225 lb (102.059 kg)       Objective:   Physical Exam  Vitals reviewed. Constitutional: He is oriented to person, place, and time. He appears well-developed. No distress.  Cardiovascular: Normal rate, regular rhythm, normal heart sounds and intact distal pulses.   No murmur heard. Pulmonary/Chest: Effort normal and breath sounds normal. No respiratory distress. He has no wheezes.  Abdominal: Soft. Bowel sounds are normal. He exhibits no distension and no mass. There is no tenderness. There is no guarding.  Musculoskeletal: Normal range of motion. He exhibits no edema.  Neurological: He is alert and  oriented to person, place, and time. No cranial nerve deficit.       Assessment:     HTN HLD: Hypokalemia:    Plan:     Check problem list.

## 2013-12-03 NOTE — Assessment & Plan Note (Signed)
BP optimal today All his antihypertensive medication reviewed with him today. He is compliant with his medications. F/U in 3 months for reassessment. Continue home BP monitoring in the interim.

## 2013-12-04 ENCOUNTER — Telehealth: Payer: Self-pay | Admitting: Family Medicine

## 2013-12-04 MED ORDER — POTASSIUM CHLORIDE CRYS ER 20 MEQ PO TBCR
20.0000 meq | EXTENDED_RELEASE_TABLET | ORAL | Status: DC
Start: 2013-12-04 — End: 2014-04-07

## 2013-12-04 NOTE — Telephone Encounter (Signed)
I called to inform patient about his low K+. Advised to increase Kdur to twice weekly from once weekly. I send his refill to the pharmacy.

## 2013-12-20 ENCOUNTER — Other Ambulatory Visit: Payer: Self-pay | Admitting: Family Medicine

## 2013-12-20 ENCOUNTER — Telehealth: Payer: Self-pay | Admitting: Family Medicine

## 2013-12-20 DIAGNOSIS — B351 Tinea unguium: Secondary | ICD-10-CM

## 2013-12-20 DIAGNOSIS — E119 Type 2 diabetes mellitus without complications: Secondary | ICD-10-CM

## 2013-12-20 NOTE — Telephone Encounter (Signed)
DR Blenda Mounts [phone] Was told by Dr Berdine Dance office he needs another referral because he now has [employer]

## 2013-12-20 NOTE — Telephone Encounter (Signed)
Referral order placed.

## 2013-12-20 NOTE — Telephone Encounter (Signed)
Will send to MD for [place]. Raysha Tilmon,CMA

## 2013-12-20 NOTE — Progress Notes (Signed)
Referral order placed.   Will send to MD for [place]. Jazmin Lucien at 12/20/2013 12:06 PM      Status: Signed            DR Blenda Mounts [phone]

## 2013-12-24 ENCOUNTER — Ambulatory Visit (INDEPENDENT_AMBULATORY_CARE_PROVIDER_SITE_OTHER): Payer: Commercial Managed Care - HMO

## 2013-12-24 DIAGNOSIS — M79606 Pain in leg, unspecified: Secondary | ICD-10-CM

## 2013-12-24 DIAGNOSIS — M79609 Pain in unspecified limb: Secondary | ICD-10-CM

## 2013-12-24 DIAGNOSIS — E1142 Type 2 diabetes mellitus with diabetic polyneuropathy: Secondary | ICD-10-CM

## 2013-12-24 DIAGNOSIS — B351 Tinea unguium: Secondary | ICD-10-CM

## 2013-12-24 DIAGNOSIS — E1149 Type 2 diabetes mellitus with other diabetic neurological complication: Secondary | ICD-10-CM

## 2013-12-24 DIAGNOSIS — E1140 Type 2 diabetes mellitus with diabetic neuropathy, unspecified: Secondary | ICD-10-CM

## 2013-12-24 NOTE — Patient Instructions (Signed)
Diabetes and Foot Care Diabetes may cause you to have problems because of poor blood supply (circulation) to your feet and legs. This may cause the skin on your feet to become thinner, break easier, and heal more slowly. Your skin may become dry, and the skin may peel and crack. You may also have nerve damage in your legs and feet causing decreased feeling in them. You may not notice minor injuries to your feet that could lead to infections or more serious problems. Taking care of your feet is one of the most important things you can do for yourself.  HOME CARE INSTRUCTIONS  Wear shoes at all times, even in the house. Do not go barefoot. Bare feet are easily injured.  Check your feet daily for blisters, cuts, and redness. If you cannot see the bottom of your feet, use a mirror or ask someone for help.  Wash your feet with warm water (do not use hot water) and mild soap. Then pat your feet and the areas between your toes until they are completely dry. Do not soak your feet as this can dry your skin.  Apply a moisturizing lotion or petroleum jelly (that does not contain alcohol and is unscented) to the skin on your feet and to dry, brittle toenails. Do not apply lotion between your toes.  Trim your toenails straight across. Do not dig under them or around the cuticle. File the edges of your nails with an emery board or nail file.  Do not cut corns or calluses or try to remove them with medicine.  Wear clean socks or stockings every day. Make sure they are not too tight. Do not wear knee-high stockings since they may decrease blood flow to your legs.  Wear shoes that fit properly and have enough cushioning. To break in new shoes, wear them for just a few hours a day. This prevents you from injuring your feet. Always look in your shoes before you put them on to be sure there are no objects inside.  Do not cross your legs. This may decrease the blood flow to your feet.  If you find a minor scrape,  cut, or break in the skin on your feet, keep it and the skin around it clean and dry. These areas may be cleansed with mild soap and water. Do not cleanse the area with peroxide, alcohol, or iodine.  When you remove an adhesive bandage, be sure not to damage the skin around it.  If you have a wound, look at it several times a day to make sure it is healing.  Do not use heating pads or hot water bottles. They may burn your skin. If you have lost feeling in your feet or legs, you may not know it is happening until it is too late.  Make sure your health care provider performs a complete foot exam at least annually or more often if you have foot problems. Report any cuts, sores, or bruises to your health care provider immediately. SEEK MEDICAL CARE IF:   You have an injury that is not healing.  You have cuts or breaks in the skin.  You have an ingrown nail.  You notice redness on your legs or feet.  You feel burning or tingling in your legs or feet.  You have pain or cramps in your legs and feet.  Your legs or feet are numb.  Your feet always feel cold. SEEK IMMEDIATE MEDICAL CARE IF:   There is increasing redness,   swelling, or pain in or around a wound.  There is a red line that goes up your leg.  Pus is coming from a wound.  You develop a fever or as directed by your health care provider.  You notice a bad smell coming from an ulcer or wound. Document Released: 05/13/2000 Document Revised: 01/16/2013 Document Reviewed: 10/23/2012 [employer] Patient Information 2015 [employer]. This information is not intended to replace advice given to you by your health care provider. Make sure you discuss any questions you have with your health care provider.  

## 2013-12-24 NOTE — Progress Notes (Signed)
   Subjective:    Patient ID: Patrick [name], male    DOB: [birth year], 63 y.o.   MRN: [id number]  HPI Comments: Pt presents for debridement of 9 toenails.     Review of Systems no new findings or systemic changes noted     Objective:   Physical Exam Lower extremity objective findings as follows vascular status is intact DP postal for bilateral PT plus one over 4 bilateral Or refill time 3 seconds all digits epicritic sensations diminished on Semmes Weinstein testing to the forefoot and toes nails thick brittle crumbly friable discolored painful tender palpation with enclosed shoe wear. Patient does have diabetic shoes which knee replacement at this time we'll get appropriate authorization for new diabetic shoes in the near future. No open wounds ulcerations no secondary infections at this time.       Assessment & Plan:  Assessment diabetes with history peripheral neuropathy his feet is stable well-managed nails thick brittle crumbly friable and mycotic AP nail procedure left second digit has successfully healed nails thick brittle crumbly friable dystrophic are debrided x9 at this time return for future diabetic foot and palliative nail care is needed will be contacted once diabetic shoe authorization obtained for measurement and fitting visit  Harriet Masson DPM

## 2014-01-12 ENCOUNTER — Other Ambulatory Visit: Payer: Self-pay | Admitting: Family Medicine

## 2014-01-27 ENCOUNTER — Other Ambulatory Visit: Payer: Self-pay | Admitting: Family Medicine

## 2014-01-28 ENCOUNTER — Other Ambulatory Visit: Payer: Self-pay | Admitting: *Deleted

## 2014-01-28 MED ORDER — LOVASTATIN 20 MG PO TABS: 20.0000 mg | ORAL_TABLET | Freq: Every day | ORAL | Status: DC

## 2014-02-16 ENCOUNTER — Encounter (HOSPITAL_COMMUNITY): Payer: Self-pay | Admitting: Emergency Medicine

## 2014-02-16 ENCOUNTER — Emergency Department (HOSPITAL_COMMUNITY)
Admission: EM | Admit: 2014-02-16 | Discharge: 2014-02-16 | Disposition: A | Discharge status: Home / Self Care | Payer: Medicare HMO | Attending: Emergency Medicine | Admitting: Emergency Medicine

## 2014-02-16 DIAGNOSIS — E119 Type 2 diabetes mellitus without complications: Secondary | ICD-10-CM | POA: Diagnosis not present

## 2014-02-16 DIAGNOSIS — R339 Retention of urine, unspecified: Secondary | ICD-10-CM | POA: Diagnosis not present

## 2014-02-16 DIAGNOSIS — I509 Heart failure, unspecified: Secondary | ICD-10-CM | POA: Diagnosis not present

## 2014-02-16 DIAGNOSIS — I10 Essential (primary) hypertension: Secondary | ICD-10-CM | POA: Insufficient documentation

## 2014-02-16 DIAGNOSIS — N289 Disorder of kidney and ureter, unspecified: Secondary | ICD-10-CM | POA: Insufficient documentation

## 2014-02-16 DIAGNOSIS — Z7982 Long term (current) use of aspirin: Secondary | ICD-10-CM | POA: Insufficient documentation

## 2014-02-16 DIAGNOSIS — E785 Hyperlipidemia, unspecified: Secondary | ICD-10-CM | POA: Diagnosis not present

## 2014-02-16 DIAGNOSIS — Z87891 Personal history of nicotine dependence: Secondary | ICD-10-CM | POA: Diagnosis not present

## 2014-02-16 DIAGNOSIS — Z87448 Personal history of other diseases of urinary system: Secondary | ICD-10-CM | POA: Insufficient documentation

## 2014-02-16 DIAGNOSIS — Z8739 Personal history of other diseases of the musculoskeletal system and connective tissue: Secondary | ICD-10-CM | POA: Diagnosis not present

## 2014-02-16 HISTORY — DX: Benign prostatic hyperplasia without lower urinary tract symptoms: N40.0

## 2014-02-16 LAB — CBC WITH DIFFERENTIAL/PLATELET
BASOS ABS: 0 10*3/uL (ref 0.0–0.1)
Basophils Relative: 0 % (ref 0–1)
EOS ABS: 0.1 10*3/uL (ref 0.0–0.7)
EOS PCT: 1 % (ref 0–5)
HCT: 35 % — ABNORMAL LOW (ref 39.0–52.0)
Hemoglobin: 12.8 g/dL — ABNORMAL LOW (ref 13.0–17.0)
LYMPHS ABS: 0.8 10*3/uL (ref 0.7–4.0)
Lymphocytes Relative: 7 % — ABNORMAL LOW (ref 12–46)
MCH: 31.4 pg (ref 26.0–34.0)
MCHC: 36.6 g/dL — ABNORMAL HIGH (ref 30.0–36.0)
MCV: 86 fL (ref 78.0–100.0)
MONO ABS: 1.5 10*3/uL — AB (ref 0.1–1.0)
Monocytes Relative: 13 % — ABNORMAL HIGH (ref 3–12)
Neutro Abs: 9.1 10*3/uL — ABNORMAL HIGH (ref 1.7–7.7)
Neutrophils Relative %: 79 % — ABNORMAL HIGH (ref 43–77)
PLATELETS: 107 10*3/uL — AB (ref 150–400)
RBC: 4.07 MIL/uL — ABNORMAL LOW (ref 4.22–5.81)
RDW: 14.1 % (ref 11.5–15.5)
WBC: 11.5 10*3/uL — ABNORMAL HIGH (ref 4.0–10.5)

## 2014-02-16 LAB — URINALYSIS, ROUTINE W REFLEX MICROSCOPIC
BILIRUBIN URINE: NEGATIVE
Glucose, UA: NEGATIVE mg/dL
Hgb urine dipstick: NEGATIVE
Ketones, ur: NEGATIVE mg/dL
LEUKOCYTES UA: NEGATIVE
NITRITE: NEGATIVE
PROTEIN: NEGATIVE mg/dL
Specific Gravity, Urine: 1.01 (ref 1.005–1.030)
UROBILINOGEN UA: 0.2 mg/dL (ref 0.0–1.0)
pH: 5.5 (ref 5.0–8.0)

## 2014-02-16 LAB — BASIC METABOLIC PANEL
Anion gap: 16 — ABNORMAL HIGH (ref 5–15)
BUN: 35 mg/dL — AB (ref 6–23)
CALCIUM: 8.6 mg/dL (ref 8.4–10.5)
CO2: 22 mEq/L (ref 19–32)
Chloride: 97 mEq/L (ref 96–112)
Creatinine, Ser: 1.48 mg/dL — ABNORMAL HIGH (ref 0.50–1.35)
GFR, EST AFRICAN AMERICAN: 56 mL/min — AB (ref >90)
GFR, EST NON AFRICAN AMERICAN: 48 mL/min — AB (ref >90)
GLUCOSE: 133 mg/dL — AB (ref 70–99)
Potassium: 4.1 mEq/L (ref 3.7–5.3)
SODIUM: 135 meq/L — AB (ref 137–147)

## 2014-02-16 NOTE — ED Provider Notes (Signed)
CSN: [id number]     Arrival date & time 02/16/14  1109 History   First MD Initiated Contact with Patient 02/16/14 1115     Chief Complaint  Patient presents with  . Urinary Retention     (Consider location/radiation/quality/duration/timing/severity/associated sxs/prior Treatment) HPI 63 year old male with history of BPH reports 3 days of decreased urine output with slight dysuria with chills 2 days ago nausea 2 days ago but no nausea or chills today no abdominal pain no chest pain no cough no shortness of breath no other concerns no treatment prior to arrival. Past Medical History  Diagnosis Date  . CHF (congestive heart failure)   . Hypertension   . DM II (diabetes mellitus, type II), controlled     Diet controlled.   . Hyperlipidemia   . Degenerative joint disease of knee, right   . Enlarged prostate    Past Surgical History  Procedure Laterality Date  . Hernia repair     Family History  Problem Relation Age of Onset  . Hypertension Mother   . Diabetes Mother   . Stroke Mother   . Cancer Father 19    Prostate  . Dementia Father   . COPD Sister   . Arthritis Sister   . Edema Sister    History  Substance Use Topics  . Smoking status: Former Smoker    Types: Pipe, Landscape architect  . Smokeless tobacco: Not on file  . Alcohol Use: No    Review of Systems 10 Systems reviewed and are negative for acute change except as noted in the HPI.   Allergies  Review of patient's allergies indicates no known allergies.  Home Medications   Prior to Admission medications   Medication Sig Start Date End Date Taking? Authorizing Provider  Ascorbic Acid (VITAMIN C) 1000 MG tablet Take 2,000 mg by mouth 2 (two) times daily.   Yes Historical Provider, MD  aspirin 325 MG tablet Take 1 tablet (325 mg total) by mouth daily. 03/22/12  Yes Cletus Gash, MD  BIOFLAVONOIDS PO Take 1 tablet by mouth 2 (two) times daily.   Yes Historical Provider, MD  carvedilol (COREG) 25 MG tablet Take 25  mg by mouth 2 (two) times daily with a meal.   Yes Historical Provider, MD  finasteride (PROSCAR) 5 MG tablet Take 5 mg by mouth daily.   Yes Historical Provider, MD  furosemide (LASIX) 40 MG tablet Take 40 mg by mouth daily.   Yes Historical Provider, MD  ibuprofen (ADVIL,MOTRIN) 800 MG tablet Take 800 mg by mouth every 12 (twelve) hours as needed for headache or moderate pain.   Yes Historical Provider, MD  lisinopril (PRINIVIL,ZESTRIL) 40 MG tablet Take 40 mg by mouth daily.   Yes Historical Provider, MD  lovastatin (MEVACOR) 20 MG tablet Take 1 tablet (20 mg total) by mouth at bedtime. 01/28/14  Yes Andrena Mews, MD  Multiple Vitamins-Minerals (MULTIVITAMIN WITH MINERALS) tablet Take 1 tablet by mouth daily.   Yes Historical Provider, MD  omega-3 acid ethyl esters (LOVAZA) 1 G capsule Take 2 g by mouth 2 (two) times daily.   Yes Historical Provider, MD  potassium chloride SA (K-DUR,KLOR-CON) 20 MEQ tablet Take 1 tablet (20 mEq total) by mouth 2 (two) times a week. 12/04/13  Yes Andrena Mews, MD  traMADol (ULTRAM) 50 MG tablet Take 50 mg by mouth every 8 (eight) hours as needed.   Yes Historical Provider, MD  traZODone (DESYREL) 150 MG tablet Take 150 mg by mouth at bedtime.  Yes Historical Provider, MD  triamterene-hydrochlorothiazide (MAXZIDE) 75-50 MG per tablet Take 1 tablet by mouth daily. 09/03/13  Yes Andrena Mews, MD   BP 118/78  Pulse 69  Temp(Src) 98 F (36.7 C) (Oral)  Resp 15  SpO2 95% Physical Exam  Nursing note and vitals reviewed. Constitutional:  Awake, alert, nontoxic appearance.  HENT:  Head: Atraumatic.  Eyes: Right eye exhibits no discharge. Left eye exhibits no discharge.  Neck: Neck supple.  Cardiovascular: Normal rate and regular rhythm.   No murmur heard. Pulmonary/Chest: Effort normal and breath sounds normal. No respiratory distress. He has no wheezes. He has no rales. He exhibits no tenderness.  Abdominal: Soft. Bowel sounds are normal. He exhibits no  distension and no mass. There is no tenderness. There is no rebound and no guarding.  No CVA tenderness; POCUS reveals a bladder volume of approximately 825ml PVR  Musculoskeletal: He exhibits no edema and no tenderness.  Baseline ROM, no obvious new focal weakness.  Neurological: He is alert.  Mental status and motor strength appears baseline for patient and situation.  Skin: No rash noted.  Psychiatric: He has a normal mood and affect.    ED Course  Procedures (including critical care time) Labs Review Labs Reviewed  BASIC METABOLIC PANEL - Abnormal; Notable for the following:    Sodium 135 (*)    Glucose, Bld 133 (*)    BUN 35 (*)    Creatinine, Ser 1.48 (*)    GFR calc non Af Amer 48 (*)    GFR calc Af Amer 56 (*)    Anion gap 16 (*)    All other components within normal limits  CBC WITH DIFFERENTIAL - Abnormal; Notable for the following:    WBC 11.5 (*)    RBC 4.07 (*)    Hemoglobin 12.8 (*)    HCT 35.0 (*)    MCHC 36.6 (*)    Platelets 107 (*)    Neutrophils Relative % 79 (*)    Lymphocytes Relative 7 (*)    Monocytes Relative 13 (*)    Neutro Abs 9.1 (*)    Monocytes Absolute 1.5 (*)    All other components within normal limits  URINALYSIS, ROUTINE W REFLEX MICROSCOPIC  PATHOLOGIST SMEAR REVIEW    Imaging Review No results found.   EKG Interpretation None      MDM   Final diagnoses:  Urinary retention  Renal insufficiency    Pt feels improved after observation and/or treatment in ED.Patient / Family / Caregiver informed of clinical course, understand medical decision-making process, and agree with plan.  I doubt any other EMC precluding discharge at this time including, but not necessarily limited to the following:sepsis.    Babette Relic, MD 02/26/14 [phone]

## 2014-02-16 NOTE — ED Notes (Signed)
He reports urinary pain and hesitancy and back pain since Thursday. Reports chills also. Hx of enlarged prostate.

## 2014-02-16 NOTE — ED Notes (Signed)
Patrick [name], Phlebotomy at bedside

## 2014-02-17 ENCOUNTER — Telehealth: Payer: Self-pay | Admitting: Family Medicine

## 2014-02-17 ENCOUNTER — Other Ambulatory Visit: Payer: Self-pay | Admitting: Family Medicine

## 2014-02-17 DIAGNOSIS — N400 Enlarged prostate without lower urinary tract symptoms: Secondary | ICD-10-CM

## 2014-02-17 DIAGNOSIS — R339 Retention of urine, unspecified: Secondary | ICD-10-CM

## 2014-02-17 NOTE — Telephone Encounter (Signed)
Pt called because he was seen in the ER on 9/20. They want him to have a referral to [facility]. He has [employer] so they need a silverback to see him. Can we start the process. jw

## 2014-02-17 NOTE — Telephone Encounter (Signed)
Nothing else for you to do. [name], Patrick Spotted

## 2014-02-17 NOTE — Telephone Encounter (Signed)
I placed urology referral order. Is there anything else I should do to expedite his referral?

## 2014-02-18 LAB — PATHOLOGIST SMEAR REVIEW

## 2014-02-28 ENCOUNTER — Ambulatory Visit: Payer: Commercial Managed Care - HMO

## 2014-03-04 ENCOUNTER — Other Ambulatory Visit: Payer: Self-pay | Admitting: Family Medicine

## 2014-03-05 ENCOUNTER — Other Ambulatory Visit: Payer: Self-pay | Admitting: Family Medicine

## 2014-03-11 ENCOUNTER — Encounter: Payer: Self-pay | Admitting: Family Medicine

## 2014-03-11 ENCOUNTER — Ambulatory Visit (INDEPENDENT_AMBULATORY_CARE_PROVIDER_SITE_OTHER): Payer: Commercial Managed Care - HMO | Admitting: Family Medicine

## 2014-03-11 VITALS — BP 115/75 | HR 56 | Temp 98.1°F | Wt 216.3 lb

## 2014-03-11 DIAGNOSIS — Z1331 Encounter for screening for depression: Secondary | ICD-10-CM | POA: Insufficient documentation

## 2014-03-11 DIAGNOSIS — Z452 Encounter for adjustment and management of vascular access device: Secondary | ICD-10-CM | POA: Insufficient documentation

## 2014-03-11 DIAGNOSIS — E785 Hyperlipidemia, unspecified: Secondary | ICD-10-CM

## 2014-03-11 DIAGNOSIS — Z0000 Encounter for general adult medical examination without abnormal findings: Secondary | ICD-10-CM

## 2014-03-11 DIAGNOSIS — Z23 Encounter for immunization: Secondary | ICD-10-CM

## 2014-03-11 DIAGNOSIS — I10 Essential (primary) hypertension: Secondary | ICD-10-CM

## 2014-03-11 DIAGNOSIS — Z1389 Encounter for screening for other disorder: Secondary | ICD-10-CM

## 2014-03-11 DIAGNOSIS — E119 Type 2 diabetes mellitus without complications: Secondary | ICD-10-CM

## 2014-03-11 LAB — POCT GLYCOSYLATED HEMOGLOBIN (HGB A1C): Hemoglobin A1C: 5.8

## 2014-03-11 NOTE — Assessment & Plan Note (Signed)
PHQ9 score of 3. Mild depression. Triggered by moving out from the house he had owned for years. He is not suicidal and doing well in general. Not compiled to starting medication. Counseling completed today. F/U in 2-3 months for reassessment or sooner if symptoms worsens.

## 2014-03-11 NOTE — Assessment & Plan Note (Signed)
Patient is doing well.  A1C today is 5.8. Continue diet control and recheck A1C yearly.

## 2014-03-11 NOTE — Assessment & Plan Note (Signed)
BP optimal 

## 2014-03-11 NOTE — Progress Notes (Signed)
Subjective:     Patient ID: Patrick [name], male   DOB: March 11, 1951, 63 y.o.   MRN: [id number]  HPI DM 2:Currently on diet control, here for follow up, no concern. KXF:[id number] with his medications, here for follow up. IRC:[id number] with Lovastatin, here for follow up. HM: He mentioned he had flu shot in Sept,2015 at [facility] [address]. He has never had colonoscopy and does not want to have it. Depression screening: Feeling sad since he had to sell his house which he had lived in for 15 years, now he is 80yrs, he is now living with his sister who needs his help.  Current Outpatient Prescriptions on File Prior to Visit  Medication Sig Dispense Refill  . Ascorbic Acid (VITAMIN C) 1000 MG tablet Take 2,000 mg by mouth 2 (two) times daily.      Marland Kitchen aspirin 325 MG tablet Take 1 tablet (325 mg total) by mouth daily.  30 tablet  2  . BIOFLAVONOIDS PO Take 1 tablet by mouth 2 (two) times daily.      . carvedilol (COREG) 25 MG tablet Take 25 mg by mouth 2 (two) times daily with a meal.      . finasteride (PROSCAR) 5 MG tablet Take 5 mg by mouth daily.      . furosemide (LASIX) 40 MG tablet Take 40 mg by mouth daily.      Marland Kitchen ibuprofen (ADVIL,MOTRIN) 800 MG tablet Take 800 mg by mouth every 12 (twelve) hours as needed for headache or moderate pain.      Marland Kitchen lisinopril (PRINIVIL,ZESTRIL) 40 MG tablet Take 40 mg by mouth daily.      Marland Kitchen lovastatin (MEVACOR) 20 MG tablet Take 1 tablet (20 mg total) by mouth at bedtime.  90 tablet  1  . Multiple Vitamins-Minerals (MULTIVITAMIN WITH MINERALS) tablet Take 1 tablet by mouth daily.      Marland Kitchen omega-3 acid ethyl esters (LOVAZA) 1 G capsule Take 2 g by mouth 2 (two) times daily.      . potassium chloride SA (K-DUR,KLOR-CON) 20 MEQ tablet Take 1 tablet (20 mEq total) by mouth 2 (two) times a week.  8 tablet  3  . traMADol (ULTRAM) 50 MG tablet Take 50 mg by mouth every 8 (eight) hours as needed.      . traZODone (DESYREL) 150 MG tablet TAKE 1 TABLET BY MOUTH EVERY  NIGHT AT BEDTIME  90 tablet  0  . triamterene-hydrochlorothiazide (MAXZIDE) 75-50 MG per tablet TAKE 1 TABLET BY MOUTH DAILY  30 tablet  0   No current facility-administered medications on file prior to visit.   Past Medical History  Diagnosis Date  . CHF (congestive heart failure)   . Hypertension   . DM II (diabetes mellitus, type II), controlled     Diet controlled.   . Hyperlipidemia   . Degenerative joint disease of knee, right   . Enlarged prostate      Review of Systems  Respiratory: Negative.   Cardiovascular: Negative.   Gastrointestinal: Negative.   Genitourinary: Negative.   Psychiatric/Behavioral: Negative for suicidal ideas, sleep disturbance and self-injury. The patient is not nervous/anxious.   All other systems reviewed and are negative.      Filed Vitals:   03/11/14 1114  BP: 115/75  Pulse: 56  Temp: 98.1 F (36.7 C)  TempSrc: Oral  Weight: 216 lb 4.8 oz (98.113 kg)    Objective:   Physical Exam  Nursing note and vitals reviewed. Constitutional: He is oriented to  person, place, and time. He appears well-developed. No distress.  Cardiovascular: Normal rate, regular rhythm, normal heart sounds and intact distal pulses.   No murmur heard. Pulmonary/Chest: Effort normal and breath sounds normal. No respiratory distress. He has no wheezes.  Abdominal: Soft. Bowel sounds are normal. He exhibits no distension and no mass.  Musculoskeletal: Normal range of motion. He exhibits no edema.  Neurological: He is alert and oriented to person, place, and time. No cranial nerve deficit.  Psychiatric: He has a normal mood and affect. His behavior is normal. Judgment and thought content normal. Thought content is not paranoid. He expresses no homicidal and no suicidal ideation. He expresses no suicidal plans and no homicidal plans.       Assessment:     DM2 diet controlled HTN HLD Health maintenance Depression screening     Plan:     Check problem list.

## 2014-03-11 NOTE — Assessment & Plan Note (Signed)
Compliant with Lovastatin. Continue this in addition to diet control. F/U as needed.

## 2014-03-11 NOTE — Patient Instructions (Signed)
  It was nice seeing you today, you doing well in general, we will check your A1C today, I will call you with test result. I will also like for you to get your Pneumonia shot as well. i did your depression screening, it seem you have very mild depression which might be related to you moving out of your home. This can be tough. As of now I Loyce't feel compelled to start you on medicine. I will recommend relaxation, exercise and good living. I will see you back in 3-4 months.  Depression Depression is feeling sad, low, down in the dumps, blue, gloomy, or empty. In general, there are two kinds of depression:  Normal sadness or grief. This can happen after something upsetting. It often goes away on its own within 2 weeks. After losing a loved one (bereavement), normal sadness and grief may last longer than two weeks. It usually gets better with time.  Clinical depression. This kind lasts longer than normal sadness or grief. It keeps you from doing the things you normally do in life. It is often hard to function at home, work, or at school. It may affect your relationships with others. Treatment is often needed. GET HELP RIGHT AWAY IF:  You have thoughts about hurting yourself or others.  You lose touch with reality (psychotic symptoms). You may:  See or hear things that are not real.  Have untrue beliefs about your life or people around you.  Your medicine is giving you problems. MAKE SURE YOU:  Understand these instructions.  Will watch your condition.  Will get help right away if you are not doing well or get worse. Document Released: 06/18/2010 Document Revised: 09/30/2013 Document Reviewed: 09/15/2011 [facility] Patient Information 2015 [place], [place]. This information is not intended to replace advice given to you by your health care provider. Make sure you discuss any questions you have with your health care provider.

## 2014-03-11 NOTE — Assessment & Plan Note (Signed)
Colonoscopy recommended. He declined GI referral for colonoscopy. Patient mentioned he got his flu shot last month at [facility]. Prevnar 13 given today.  NB: I called his pharmacy and they confirmed he had flu shot on Sept 18th, 2015: Confirmed by Pharmacy tech Scientist, water quality).

## 2014-03-14 ENCOUNTER — Ambulatory Visit (INDEPENDENT_AMBULATORY_CARE_PROVIDER_SITE_OTHER): Payer: Commercial Managed Care - HMO

## 2014-03-14 DIAGNOSIS — E1140 Type 2 diabetes mellitus with diabetic neuropathy, unspecified: Secondary | ICD-10-CM

## 2014-03-18 ENCOUNTER — Other Ambulatory Visit: Payer: Self-pay | Admitting: Family Medicine

## 2014-03-25 ENCOUNTER — Ambulatory Visit (INDEPENDENT_AMBULATORY_CARE_PROVIDER_SITE_OTHER): Payer: Commercial Managed Care - HMO

## 2014-03-25 ENCOUNTER — Ambulatory Visit: Payer: Commercial Managed Care - HMO

## 2014-03-25 DIAGNOSIS — E1140 Type 2 diabetes mellitus with diabetic neuropathy, unspecified: Secondary | ICD-10-CM

## 2014-03-25 DIAGNOSIS — B351 Tinea unguium: Secondary | ICD-10-CM

## 2014-03-25 DIAGNOSIS — M79606 Pain in leg, unspecified: Secondary | ICD-10-CM

## 2014-03-25 NOTE — Progress Notes (Signed)
   Subjective:    Patient ID: Patrick [name], male    DOB: 1951-04-18, 63 y.o.   MRN: [id number]  HPI Patient presents for nail debridement   Review of Systems no new findings or systemic changes noted     Objective:   Physical Exam Neurovascular status is intact pedal pulses DP +2 PT +1 over 4 Refill 3 seconds mild bunion deformity no keratoses no ulcers no open wounds noted nails thick brittle chromic friable discolored with tenderness and palpation noted no secondary infections mild hammertoes identified 2 through 5 bilateral       Assessment & Plan:  Assessment diabetes with history of peripheral neuropathy. Patient appears to be doing well open was no ulcers thick brittle chromic friable mycotic nails 1 through 5 bilateral debrided at this time return for palliative care in 3 months as recommended next  Harriet Masson DPM

## 2014-04-07 ENCOUNTER — Other Ambulatory Visit: Payer: Self-pay | Admitting: Family Medicine

## 2014-04-09 ENCOUNTER — Other Ambulatory Visit: Payer: Self-pay | Admitting: Family Medicine

## 2014-05-02 ENCOUNTER — Other Ambulatory Visit: Payer: Self-pay | Admitting: Family Medicine

## 2014-05-16 ENCOUNTER — Encounter: Payer: Self-pay | Admitting: Family Medicine

## 2014-05-16 NOTE — Progress Notes (Signed)
Patient ID: Patrick [name], male   DOB: [birth year], 63 y.o.   MRN: [id number] I got letter from his insurance about his home assessment. He was assessed for depression and financial stability, I will reassess patient at next visit and ensure appropriate referral.

## 2014-05-19 ENCOUNTER — Other Ambulatory Visit: Payer: Self-pay | Admitting: Family Medicine

## 2014-05-19 ENCOUNTER — Telehealth: Payer: Self-pay | Admitting: Family Medicine

## 2014-05-19 DIAGNOSIS — E119 Type 2 diabetes mellitus without complications: Secondary | ICD-10-CM

## 2014-05-19 NOTE — Telephone Encounter (Signed)
I will put in referral order. Please help with referral processing. Thanks.

## 2014-05-19 NOTE — Telephone Encounter (Signed)
Pt called and said the the doctor wanted him to see a eye doctor. He would like to go to [facility] and see Dr. Zigmund Daniel. Their number is [phone]. jw

## 2014-05-19 NOTE — Addendum Note (Signed)
Addended by: Andrena Mews T on: 05/19/2014 04:34 PM   Modules accepted: Orders

## 2014-05-20 ENCOUNTER — Encounter: Payer: Self-pay | Admitting: Family Medicine

## 2014-05-20 ENCOUNTER — Ambulatory Visit (INDEPENDENT_AMBULATORY_CARE_PROVIDER_SITE_OTHER): Payer: Commercial Managed Care - HMO | Admitting: Family Medicine

## 2014-05-20 VITALS — BP 115/73 | HR 62 | Temp 98.2°F | Wt 211.0 lb

## 2014-05-20 DIAGNOSIS — W57XXXA Bitten or stung by nonvenomous insect and other nonvenomous arthropods, initial encounter: Secondary | ICD-10-CM

## 2014-05-20 DIAGNOSIS — B9789 Other viral agents as the cause of diseases classified elsewhere: Principal | ICD-10-CM

## 2014-05-20 DIAGNOSIS — J069 Acute upper respiratory infection, unspecified: Secondary | ICD-10-CM

## 2014-05-20 DIAGNOSIS — T148 Other injury of unspecified body region: Secondary | ICD-10-CM

## 2014-05-20 MED ORDER — BENZONATATE 100 MG PO CAPS: 100.0000 mg | ORAL_CAPSULE | Freq: Two times a day (BID) | ORAL | Status: DC | PRN

## 2014-05-20 MED ORDER — FLUTICASONE PROPIONATE 50 MCG/ACT NA SUSP: 2.0000 | Freq: Every day | NASAL | Status: DC

## 2014-05-20 NOTE — Assessment & Plan Note (Signed)
3 bites on lower extremities, possibly bed bugs given distribution and appearance.  - Bed bug pamphlet provided. - F/u if not improving in 1 week.

## 2014-05-20 NOTE — Assessment & Plan Note (Addendum)
Viral URI most likely; afebrile with normal vitals in clinic. Lungs clear. - Reassured; conservative treatment with fluids and rest. - Flonase and prn tessalon perles rx'ed - Mucinex PRN. - Return precautions reviewed. F/u 1 week if not improving. - Ear washout bilaterally to help with symptoms given significant amt of cerumen.

## 2014-05-20 NOTE — Progress Notes (Signed)
Patient ID: Patrick [name], male   DOB: September 01, 1950, 63 y.o.   MRN: [id number] Subjective:   CC: Cough and sore throat  HPI:   63 y.o. male presenting to sameday clinic for cough and sore throat for 1 week, productive for 3 days.  Yesterday woke up with terrible sore throat and worsened coughing. Sore throat has resolved except when coughs.  Nasal congestion present.  Has not slept well 2 nights due to cough. Runny nose today, not much sneezing. Subjective fever last night, sweat through PJs this morning. Cough drops help with the sore throat. Has not tried anything else. No pain anywhere. Sleeping with 1-2 pillows. Some PND. No rash, diarrhea, nausea, vomiting, stomach pain, chest pain, dyspnea, swelling, or weight changes. No sick contacts. Taking lasix 40mg  daily without missing any doses. Taking lisinopril years without problems.  Also reports a few red bumps (left upper thigh, right posterior calf, right hip). Denies fevers or chills. No one else in house has similar symptoms.  Review of Systems - Per HPI.   PMH - SBO hx, skin lesion, onychomycosis, obesity, mac degeneration, right knee pain, insomnia, hypothyroidism, HLD, HTN, diverticular disease, diet-controlled DM type II, depression, CHF, cholelithiasis, cardiac murmur, h/o BPH Smoking status: Nonsmoker except occasional pipe/cigar, one a month.    Objective:  Physical Exam BP 115/73 mmHg  Pulse 62  Temp(Src) 98.2 F (36.8 C) (Oral)  Wt 211 lb (95.709 kg) GEN: NAD CV: RRR, no m/r/g, distant; 2+ bilateral radial pulses PULM: CTAB, normal effort, no wheezes or crackles EXTR: No LE edema or calf tenderness HEENT: AT/[place], sclera clear, EOMI, o/p clear, TMs clear with much cerumen bilaterally; nares inflamed appearing (mild erythema), neck supple, right submandibular nontender LAD SKIN: No rash or cyanosis other than right calf, right hip, and left upper thigh with 1cm erythematous nontender nonfluctuant mildly raised areas of  skin with punctate central lesion.  Assessment:     Patrick [name] is a 63 y.o. male here for URI symptoms.    Plan:     # See problem list and after visit summary for problem-specific plans.   # Health Maintenance: Flu shot given this year.  Follow-up: Follow up in 1 week PRN lack of improvement   Hilton Sinclair, MD [place]

## 2014-05-20 NOTE — Patient Instructions (Signed)
Good to see you today.  Your symptoms sound like a viral upper respiratory infection. This should get better with time. Your lungs sound clear and you are breathing easily here. Your legs also do not seem to be swollen. Drink plenty of fluids and get lots of rest. Use tessalon perles as needed for cough. Also try honey/warm teas/salt water gargle. Also use flonase daily to help with nasal congestion. This works best if taken daily. If you have chest congestion that is not breaking up, try over the counter extra strength mucinex. Follow up in 1 week if not feeling better or sooner as needed for severe symptoms.  Your rash looks like bug bites. Look at your mattress in creases/seams for bedbugs. This could also be fleas or another rinsect.  Best,  Hilton Sinclair, MD

## 2014-05-21 ENCOUNTER — Ambulatory Visit: Payer: Medicaid Other | Admitting: Family Medicine

## 2014-05-23 ENCOUNTER — Encounter (HOSPITAL_COMMUNITY): Payer: Self-pay | Admitting: Emergency Medicine

## 2014-05-23 ENCOUNTER — Emergency Department (HOSPITAL_COMMUNITY)
Admission: EM | Admit: 2014-05-23 | Discharge: 2014-05-23 | Disposition: A | Discharge status: Home / Self Care | Payer: Medicare HMO | Attending: Emergency Medicine | Admitting: Emergency Medicine

## 2014-05-23 ENCOUNTER — Emergency Department (HOSPITAL_COMMUNITY): Payer: Medicare HMO

## 2014-05-23 DIAGNOSIS — Z8739 Personal history of other diseases of the musculoskeletal system and connective tissue: Secondary | ICD-10-CM | POA: Diagnosis not present

## 2014-05-23 DIAGNOSIS — E119 Type 2 diabetes mellitus without complications: Secondary | ICD-10-CM | POA: Insufficient documentation

## 2014-05-23 DIAGNOSIS — Z87891 Personal history of nicotine dependence: Secondary | ICD-10-CM | POA: Diagnosis not present

## 2014-05-23 DIAGNOSIS — R059 Cough, unspecified: Secondary | ICD-10-CM

## 2014-05-23 DIAGNOSIS — E785 Hyperlipidemia, unspecified: Secondary | ICD-10-CM | POA: Insufficient documentation

## 2014-05-23 DIAGNOSIS — R05 Cough: Secondary | ICD-10-CM

## 2014-05-23 DIAGNOSIS — J9811 Atelectasis: Secondary | ICD-10-CM

## 2014-05-23 DIAGNOSIS — I10 Essential (primary) hypertension: Secondary | ICD-10-CM | POA: Diagnosis not present

## 2014-05-23 DIAGNOSIS — Z7951 Long term (current) use of inhaled steroids: Secondary | ICD-10-CM | POA: Diagnosis not present

## 2014-05-23 DIAGNOSIS — Z79899 Other long term (current) drug therapy: Secondary | ICD-10-CM | POA: Insufficient documentation

## 2014-05-23 DIAGNOSIS — N400 Enlarged prostate without lower urinary tract symptoms: Secondary | ICD-10-CM | POA: Diagnosis not present

## 2014-05-23 DIAGNOSIS — Z791 Long term (current) use of non-steroidal anti-inflammatories (NSAID): Secondary | ICD-10-CM | POA: Diagnosis not present

## 2014-05-23 DIAGNOSIS — I509 Heart failure, unspecified: Secondary | ICD-10-CM | POA: Diagnosis not present

## 2014-05-23 DIAGNOSIS — Z7982 Long term (current) use of aspirin: Secondary | ICD-10-CM | POA: Insufficient documentation

## 2014-05-23 MED ORDER — AZITHROMYCIN 250 MG PO TABS: ORAL_TABLET | ORAL | Status: DC

## 2014-05-23 MED ORDER — HYDROCODONE-HOMATROPINE 5-1.5 MG/5ML PO SYRP: 5.0000 mL | ORAL_SOLUTION | Freq: Four times a day (QID) | ORAL | Status: DC | PRN

## 2014-05-23 NOTE — ED Notes (Addendum)
Pt reports cough x 1 week. Pt seen by PMD on Monday and given Benzonatate and fluticasone without relief. Pt reports coughing up yellow secretions.

## 2014-05-23 NOTE — ED Provider Notes (Signed)
CSN: [id number]     Arrival date & time 05/23/14  1334 History   First MD Initiated Contact with Patient 05/23/14 1416    This chart was scribed for non-physician practitioner, Margarita Mail, working with No att. providers found by Terressa Koyanagi, ED Scribe. This patient was seen in room TR07C/TR07C and the patient's care was started at 2:34 PM.  Chief Complaint  Patient presents with  . Cough   The history is provided by the patient. No language interpreter was used.   PCP: Andrena Mews, MD HPI Comments: Patrick [name] is a 63 y.o. male, with medical Hx noted below and significant for CHF, HTN, DMTII, HLD, who presents to the Emergency Department complaining of acute, worsening, intermittent productive cough with yellow sputum onset one week ago. Pt reports he was seen by his PCP on 4 days ago for the same and prescribed Benzonatate and fluticasone. Pt reports taking said meds without relief. Pt denies night sweats, unexpected weight loss, fever, or chills.   Pt also reports a concern regarding his BP. Pt reports his diastolic number is 59 and states he has been treated for BP with Lisinopril for the past 15 years or so and his diastolic number has never been so low.  Past Medical History  Diagnosis Date  . CHF (congestive heart failure)   . Hypertension   . DM II (diabetes mellitus, type II), controlled     Diet controlled.   . Hyperlipidemia   . Degenerative joint disease of knee, right   . Enlarged prostate    Past Surgical History  Procedure Laterality Date  . Hernia repair     Family History  Problem Relation Age of Onset  . Hypertension Mother   . Diabetes Mother   . Stroke Mother   . Cancer Father 58    Prostate  . Dementia Father   . COPD Sister   . Arthritis Sister   . Edema Sister    History  Substance Use Topics  . Smoking status: Former Smoker    Types: Pipe, Landscape architect  . Smokeless tobacco: Not on file  . Alcohol Use: No    Review of Systems   Constitutional: Negative for fever, chills and unexpected weight change.  Respiratory: Positive for cough.   Psychiatric/Behavioral: Negative for confusion.      Allergies  Review of patient's allergies indicates no known allergies.  Home Medications   Prior to Admission medications   Medication Sig Start Date End Date Taking? Authorizing Provider  Ascorbic Acid (VITAMIN C) 1000 MG tablet Take 2,000 mg by mouth 2 (two) times daily.    Historical Provider, MD  aspirin EC 81 MG EC tablet Take 1 tablet (81 mg total) by mouth daily. 06/03/14   [place] Rumley, DO  benzonatate (TESSALON) 100 MG capsule Take 1 capsule (100 mg total) by mouth 2 (two) times daily as needed for cough. Patient not taking: Reported on 06/01/2014 05/20/14   Hilton Sinclair, MD  BIOFLAVONOIDS PO Take 1 tablet by mouth 2 (two) times daily.    Historical Provider, MD  carvedilol (COREG) 6.25 MG tablet Take 1 tablet (6.25 mg total) by mouth 2 (two) times daily with a meal. 06/03/14   [place] Rumley, DO  finasteride (PROSCAR) 5 MG tablet Take 5 mg by mouth daily.    Historical Provider, MD  fluticasone (FLONASE) 50 MCG/ACT nasal spray Place 2 sprays into both nostrils daily. 05/20/14   Hilton Sinclair, MD  HYDROcodone-homatropine [facility]) 5-1.5  MG/5ML syrup Take 5 mLs by mouth every 6 (six) hours as needed for cough. 05/23/14   Margarita Mail, PA-C  ibuprofen (ADVIL,MOTRIN) 800 MG tablet Take 800 mg by mouth every 12 (twelve) hours as needed for headache or moderate pain.    Historical Provider, MD  lovastatin (MEVACOR) 20 MG tablet Take 1 tablet (20 mg total) by mouth at bedtime. 01/28/14   Andrena Mews, MD  Multiple Vitamins-Minerals (MULTIVITAMIN WITH MINERALS) tablet Take 1 tablet by mouth daily.    Historical Provider, MD  omega-3 acid ethyl esters (LOVAZA) 1 G capsule Take 2 g by mouth 2 (two) times daily.    Historical Provider, MD  potassium chloride SA (K-DUR,KLOR-CON) 20 MEQ tablet TAKE 1 TABLET BY  MOUTH TWICE A WEEK 04/07/14   Andrena Mews, MD  senna (SENOKOT) 8.6 MG TABS tablet Take 1 tablet (8.6 mg total) by mouth daily as needed for mild constipation. 06/03/14   [place] N Rumley, DO  tamsulosin (FLOMAX) 0.4 MG CAPS capsule Take 0.4 mg by mouth daily.     Historical Provider, MD  traMADol (ULTRAM) 50 MG tablet TAKE 1 TABLET BY MOUTH EVERY 8 HOURS AS NEEDED Patient not taking: Reported on 06/01/2014 05/06/14   Andrena Mews, MD  traZODone (DESYREL) 150 MG tablet TAKE 1 TABLET BY MOUTH EVERY NIGHT AT BEDTIME Patient not taking: Reported on 06/01/2014 03/05/14   Alveda Reasons, MD   Triage Vitals: BP 129/57 mmHg  Pulse 60  Temp(Src) 97.8 F (36.6 C) (Oral)  Resp 16  Ht 5\' 9"  (1.753 m)  Wt 211 lb (95.709 kg)  BMI 31.15 kg/m2  SpO2 96% Physical Exam  Constitutional: He is oriented to person, place, and time. He appears well-developed and well-nourished. No distress.  HENT:  Head: Normocephalic and atraumatic.  Eyes: Conjunctivae and EOM are normal.  Neck: Neck supple.  Cardiovascular: Normal rate.   Pulmonary/Chest: No respiratory distress.  hyperresonant breath sounds on the right.   Abdominal: There is no tenderness.  Musculoskeletal: Normal range of motion.  Neurological: He is alert and oriented to person, place, and time.  Skin: Skin is warm and dry.  Psychiatric: He has a normal mood and affect. His behavior is normal.  Nursing note and vitals reviewed.   ED Course  Procedures (including critical care time) DIAGNOSTIC STUDIES: Oxygen Saturation is 96% on RA, adequate by my interpretation.    COORDINATION OF CARE: 2:39 PM-Discussed treatment plan which includes imaging with pt at bedside and pt agreed to plan.   Labs Review Labs Reviewed - No data to display  Imaging Review No results found.   EKG Interpretation None      MDM   Final diagnoses:  Cough  Atelectasis    Filed Vitals:   05/23/14 1340 05/23/14 1347 05/23/14 1519 05/23/14 1536  BP:  129/57  90/55 102/60  Pulse:  60 60   Temp:  97.8 F (36.6 C) 98.4 F (36.9 C)   TempSrc:  Oral Oral   Resp:  16 20   Height: 5\' 9"  (1.753 m)     Weight: 211 lb (95.709 kg)     SpO2:  96% 95%     Patient will be treated for CAP as he has continued cough and atelectasis on cxr.  Given fluid with increase in his pressure. I have discussed the patient with attending provider. He is hemodynamically stable and negative orthostatics. Dc with zpack and cough medicine     I personally performed the services described in this  documentation, which was scribed in my presence. The recorded information has been reviewed and is accurate.   I personally performed the services described in this documentation, which was scribed in my presence. The recorded information has been reviewed and is accurate.     Margarita Mail, PA-C 06/04/14 [place], MD 06/05/14 [phone]

## 2014-05-23 NOTE — ED Notes (Signed)
Patient returned from X-ray 

## 2014-05-27 ENCOUNTER — Other Ambulatory Visit: Payer: Self-pay | Admitting: *Deleted

## 2014-05-27 ENCOUNTER — Telehealth: Payer: Self-pay | Admitting: Family Medicine

## 2014-05-27 NOTE — Telephone Encounter (Signed)
Informed pt that PCP would like for him to schedule an appt soon for follow up for blood pressure and medications.  Pt stated he has an appt  06/14/2014.  He has not had a chance to take blood pressure today.  Advised to check blood pressure as soon as possible and call nurse back with reading.  Provider my continue hold Lisinopril if BP in not more than 140/80. Pt stated understanding.  Derl Barrow, RN

## 2014-05-27 NOTE — Telephone Encounter (Signed)
Please have patient come see me soon, may continue to hold Lisinopril if BP is not more than 140/80

## 2014-05-27 NOTE — Telephone Encounter (Signed)
Pt called and was seen in the ER on 12/25. The doctor at the ER told him not to take his Lisinopril until today 12/29 because his BP was very low. Now he is worried about starting it again if his BP is going to go down or will it be okay. He would like another opinion. Please call jw

## 2014-05-27 NOTE — Telephone Encounter (Signed)
Spoke with pt regarding Lisinopril.  Pt stated he has not taken the medication today.  Pt asked if he rechecked his blood pressure.  Pt advise to recheck blood pressure so nurse could speak with one of the doctors about restarting the Lisinopril.  Pt offered an appt for a nurse visit today; pt declined he is not able to come into clinic today.  Reviewing blood pressure from 05/23/2014 BP 102/60 and heart rate 60.  Pt told to call nurse back with most recent BP reading.  Pt stated understanding.  Will forward to PCP.  Derl Barrow, RN

## 2014-05-30 DIAGNOSIS — J189 Pneumonia, unspecified organism: Secondary | ICD-10-CM

## 2014-05-30 HISTORY — DX: Pneumonia, unspecified organism: J18.9

## 2014-06-01 ENCOUNTER — Emergency Department (HOSPITAL_COMMUNITY): Payer: Commercial Managed Care - HMO

## 2014-06-01 ENCOUNTER — Observation Stay (HOSPITAL_COMMUNITY)
Admission: EM | Admit: 2014-06-01 | Discharge: 2014-06-03 | Disposition: A | Discharge status: Home / Self Care | Payer: Commercial Managed Care - HMO | Attending: Family Medicine | Admitting: Family Medicine

## 2014-06-01 ENCOUNTER — Encounter (HOSPITAL_COMMUNITY): Payer: Self-pay | Admitting: Emergency Medicine

## 2014-06-01 DIAGNOSIS — R05 Cough: Secondary | ICD-10-CM | POA: Insufficient documentation

## 2014-06-01 DIAGNOSIS — E785 Hyperlipidemia, unspecified: Secondary | ICD-10-CM

## 2014-06-01 DIAGNOSIS — F329 Major depressive disorder, single episode, unspecified: Secondary | ICD-10-CM | POA: Diagnosis not present

## 2014-06-01 DIAGNOSIS — N529 Male erectile dysfunction, unspecified: Secondary | ICD-10-CM | POA: Diagnosis not present

## 2014-06-01 DIAGNOSIS — I509 Heart failure, unspecified: Secondary | ICD-10-CM | POA: Diagnosis not present

## 2014-06-01 DIAGNOSIS — Z87891 Personal history of nicotine dependence: Secondary | ICD-10-CM | POA: Diagnosis not present

## 2014-06-01 DIAGNOSIS — M179 Osteoarthritis of knee, unspecified: Secondary | ICD-10-CM | POA: Insufficient documentation

## 2014-06-01 DIAGNOSIS — E119 Type 2 diabetes mellitus without complications: Secondary | ICD-10-CM | POA: Diagnosis not present

## 2014-06-01 DIAGNOSIS — I4510 Unspecified right bundle-branch block: Secondary | ICD-10-CM | POA: Insufficient documentation

## 2014-06-01 DIAGNOSIS — I10 Essential (primary) hypertension: Secondary | ICD-10-CM

## 2014-06-01 DIAGNOSIS — Z79899 Other long term (current) drug therapy: Secondary | ICD-10-CM | POA: Insufficient documentation

## 2014-06-01 DIAGNOSIS — R7989 Other specified abnormal findings of blood chemistry: Secondary | ICD-10-CM

## 2014-06-01 DIAGNOSIS — Z7982 Long term (current) use of aspirin: Secondary | ICD-10-CM | POA: Diagnosis not present

## 2014-06-01 DIAGNOSIS — R778 Other specified abnormalities of plasma proteins: Secondary | ICD-10-CM | POA: Insufficient documentation

## 2014-06-01 DIAGNOSIS — I5020 Unspecified systolic (congestive) heart failure: Secondary | ICD-10-CM

## 2014-06-01 DIAGNOSIS — N400 Enlarged prostate without lower urinary tract symptoms: Secondary | ICD-10-CM | POA: Insufficient documentation

## 2014-06-01 DIAGNOSIS — R55 Syncope and collapse: Secondary | ICD-10-CM | POA: Diagnosis present

## 2014-06-01 DIAGNOSIS — N289 Disorder of kidney and ureter, unspecified: Secondary | ICD-10-CM | POA: Diagnosis not present

## 2014-06-01 DIAGNOSIS — E876 Hypokalemia: Secondary | ICD-10-CM | POA: Insufficient documentation

## 2014-06-01 DIAGNOSIS — R5081 Fever presenting with conditions classified elsewhere: Secondary | ICD-10-CM | POA: Insufficient documentation

## 2014-06-01 LAB — URINALYSIS, ROUTINE W REFLEX MICROSCOPIC
Bilirubin Urine: NEGATIVE
GLUCOSE, UA: NEGATIVE mg/dL
Hgb urine dipstick: NEGATIVE
Ketones, ur: NEGATIVE mg/dL
Leukocytes, UA: NEGATIVE
Nitrite: NEGATIVE
PH: 7 (ref 5.0–8.0)
Protein, ur: NEGATIVE mg/dL
SPECIFIC GRAVITY, URINE: 1.007 (ref 1.005–1.030)
UROBILINOGEN UA: 0.2 mg/dL (ref 0.0–1.0)

## 2014-06-01 LAB — D-DIMER, QUANTITATIVE (NOT AT ARMC): D DIMER QUANT: 2.19 ug{FEU}/mL — AB (ref 0.00–0.48)

## 2014-06-01 LAB — COMPREHENSIVE METABOLIC PANEL
ALBUMIN: 4 g/dL (ref 3.5–5.2)
ALT: 18 U/L (ref 0–53)
ANION GAP: 10 (ref 5–15)
AST: 22 U/L (ref 0–37)
Alkaline Phosphatase: 67 U/L (ref 39–117)
BUN: 13 mg/dL (ref 6–23)
CALCIUM: 9.3 mg/dL (ref 8.4–10.5)
CO2: 31 mmol/L (ref 19–32)
CREATININE: 1.31 mg/dL (ref 0.50–1.35)
Chloride: 90 mEq/L — ABNORMAL LOW (ref 96–112)
GFR calc Af Amer: 64 mL/min — ABNORMAL LOW (ref >90)
GFR calc non Af Amer: 56 mL/min — ABNORMAL LOW (ref >90)
Glucose, Bld: 126 mg/dL — ABNORMAL HIGH (ref 70–99)
Potassium: 3.3 mmol/L — ABNORMAL LOW (ref 3.5–5.1)
Sodium: 131 mmol/L — ABNORMAL LOW (ref 135–145)
Total Bilirubin: 1.5 mg/dL — ABNORMAL HIGH (ref 0.3–1.2)
Total Protein: 7.4 g/dL (ref 6.0–8.3)

## 2014-06-01 LAB — CBC
HCT: 38.8 % — ABNORMAL LOW (ref 39.0–52.0)
Hemoglobin: 13.7 g/dL (ref 13.0–17.0)
MCH: 29 pg (ref 26.0–34.0)
MCHC: 35.3 g/dL (ref 30.0–36.0)
MCV: 82 fL (ref 78.0–100.0)
PLATELETS: 125 10*3/uL — AB (ref 150–400)
RBC: 4.73 MIL/uL (ref 4.22–5.81)
RDW: 14.1 % (ref 11.5–15.5)
WBC: 9.9 10*3/uL (ref 4.0–10.5)

## 2014-06-01 LAB — CBG MONITORING, ED: GLUCOSE-CAPILLARY: 131 mg/dL — AB (ref 70–99)

## 2014-06-01 LAB — I-STAT TROPONIN, ED: TROPONIN I, POC: 0.01 ng/mL (ref 0.00–0.08)

## 2014-06-01 LAB — TROPONIN I: Troponin I: 0.03 ng/mL (ref <0.031)

## 2014-06-01 MED ORDER — PRAVASTATIN SODIUM 20 MG PO TABS
20.0000 mg | ORAL_TABLET | Freq: Every day | ORAL | Status: DC
  Administered 2014-06-02: 20 mg via ORAL
  Filled 2014-06-01 (×2): qty 1

## 2014-06-01 MED ORDER — ASPIRIN EC 81 MG PO TBEC
81.0000 mg | DELAYED_RELEASE_TABLET | Freq: Every day | ORAL | Status: DC
  Administered 2014-06-02 – 2014-06-03 (×2): 81 mg via ORAL
  Filled 2014-06-01 (×2): qty 1

## 2014-06-01 MED ORDER — LEVOFLOXACIN IN D5W 750 MG/150ML IV SOLN
750.0000 mg | INTRAVENOUS | Status: DC
  Filled 2014-06-01: qty 150

## 2014-06-01 MED ORDER — SODIUM CHLORIDE 0.9 % IV SOLN
INTRAVENOUS | Status: DC
  Administered 2014-06-01: 23:00:00 via INTRAVENOUS
  Administered 2014-06-02: 75 mL/h via INTRAVENOUS

## 2014-06-01 MED ORDER — SODIUM CHLORIDE 0.9 % IJ SOLN
3.0000 mL | Freq: Two times a day (BID) | INTRAMUSCULAR | Status: DC
  Administered 2014-06-02: 3 mL via INTRAVENOUS

## 2014-06-01 MED ORDER — BENZONATATE 100 MG PO CAPS
100.0000 mg | ORAL_CAPSULE | Freq: Two times a day (BID) | ORAL | Status: DC | PRN
Start: 2014-06-01 — End: 2014-06-03
  Filled 2014-06-01: qty 1

## 2014-06-01 MED ORDER — IBUPROFEN 800 MG PO TABS
800.0000 mg | ORAL_TABLET | Freq: Two times a day (BID) | ORAL | Status: DC | PRN
  Filled 2014-06-01: qty 1

## 2014-06-01 MED ORDER — HEPARIN SODIUM (PORCINE) 5000 UNIT/ML IJ SOLN
5000.0000 [IU] | Freq: Three times a day (TID) | INTRAMUSCULAR | Status: DC
  Administered 2014-06-01 – 2014-06-03 (×5): 5000 [IU] via SUBCUTANEOUS
  Filled 2014-06-01 (×8): qty 1

## 2014-06-01 MED ORDER — FINASTERIDE 5 MG PO TABS
5.0000 mg | ORAL_TABLET | Freq: Every day | ORAL | Status: DC
  Administered 2014-06-02 – 2014-06-03 (×2): 5 mg via ORAL
  Filled 2014-06-01 (×2): qty 1

## 2014-06-01 MED ORDER — IOHEXOL 350 MG/ML SOLN
100.0000 mL | Freq: Once | INTRAVENOUS | Status: AC | PRN
  Administered 2014-06-01: 100 mL via INTRAVENOUS

## 2014-06-01 MED ORDER — PANTOPRAZOLE SODIUM 20 MG PO TBEC
20.0000 mg | DELAYED_RELEASE_TABLET | Freq: Every day | ORAL | Status: DC
  Administered 2014-06-02 – 2014-06-03 (×2): 20 mg via ORAL
  Filled 2014-06-01 (×2): qty 1

## 2014-06-01 MED ORDER — HYDROCODONE-HOMATROPINE 5-1.5 MG/5ML PO SYRP: 5.0000 mL | ORAL_SOLUTION | Freq: Four times a day (QID) | ORAL | Status: DC | PRN

## 2014-06-01 MED ORDER — CARVEDILOL 25 MG PO TABS
25.0000 mg | ORAL_TABLET | Freq: Two times a day (BID) | ORAL | Status: DC
  Filled 2014-06-01: qty 1

## 2014-06-01 MED ORDER — TAMSULOSIN HCL 0.4 MG PO CAPS
0.4000 mg | ORAL_CAPSULE | Freq: Every day | ORAL | Status: DC
  Administered 2014-06-02 – 2014-06-03 (×2): 0.4 mg via ORAL
  Filled 2014-06-01 (×2): qty 1

## 2014-06-01 MED ORDER — TRAMADOL HCL 50 MG PO TABS
50.0000 mg | ORAL_TABLET | Freq: Three times a day (TID) | ORAL | Status: DC | PRN
  Administered 2014-06-03: 50 mg via ORAL
  Filled 2014-06-01: qty 1

## 2014-06-01 MED ORDER — POTASSIUM CHLORIDE CRYS ER 20 MEQ PO TBCR
40.0000 meq | EXTENDED_RELEASE_TABLET | Freq: Once | ORAL | Status: AC
  Administered 2014-06-01: 40 meq via ORAL
  Filled 2014-06-01: qty 2

## 2014-06-01 MED ORDER — FLUTICASONE PROPIONATE 50 MCG/ACT NA SUSP
2.0000 | Freq: Every day | NASAL | Status: DC
  Administered 2014-06-02 – 2014-06-03 (×2): 2 via NASAL
  Filled 2014-06-01: qty 16

## 2014-06-01 MED ORDER — SODIUM CHLORIDE 0.9 % IV SOLN
INTRAVENOUS | Status: AC
  Administered 2014-06-01: 22:00:00 via INTRAVENOUS

## 2014-06-01 NOTE — ED Notes (Signed)
Family practice at bedside.

## 2014-06-01 NOTE — ED Notes (Signed)
Per EMS: Pt reports syncope while putting something on top of refrigerator.  Pt does not remember event, pt has no pain in back, neck or head.  Pt was pale on arrival, pinpoint pupils.  Denies n/v/d, pain.  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

## 2014-06-01 NOTE — ED Notes (Signed)
Pt reports syncope while putting something on top of refrigerator.  Pt does not remember event, pt has no pain in back, neck or head.  Pt was pale on arrival, pinpoint pupils.  Denies n/v/d, pain.  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

## 2014-06-01 NOTE — ED Provider Notes (Signed)
CSN: [id number]     Arrival date & time 06/01/14  1543 History   First MD Initiated Contact with Patient 06/01/14 1604     Chief Complaint  Patient presents with  . 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 He reports he had washed her BiPAP machine. He had just placed a part of it on top of the refrigerator dry. He is going back to the sink and felt a mild nausea come on he stated there in put his head down for second and the next thing he was aware of was sitting on the floor with his legs spread out in front of him. He reports he has no idea how he got into that position. He has no idea how long he was on the floor. He denies that he has any headache. He denies he has any chest pain either preceding or after the event. He denies that he was ill preceding the event. He reports he was seen approximately 8 days ago for a persistent cough. He had been treated with Tessalon Perles and fluticasone and not improving. He reports the symptoms are moderately improved now, he does however continue to have a intermittent hacking cough. He denies he has chest pain or shortness of breath in association with this he also denies that he has makes retaining any fever. He did not perceives that this symptom has been worsening. He has a known history of congestive heart failure and hypertension. The patient reports his last stress test was a very long time ago. He denies she's ever had a cardiac catheterization. He reports he stopped taking his lisinopril based on instructions provided to him in the emergency department during his last visit due to documented hypotension. He has not resumed it. Past Medical History  Diagnosis Date  . CHF (congestive heart failure)   . Hypertension   . DM II (diabetes mellitus, type II), controlled     Diet controlled.   . Hyperlipidemia   . Degenerative  joint disease of knee, right   . Enlarged prostate    Past Surgical History  Procedure Laterality Date  . Hernia repair     Family History  Problem Relation Age of Onset  . Hypertension Mother   . Diabetes Mother   . Stroke Mother   . Cancer Father 21    Prostate  . Dementia Father   . COPD Sister   . Arthritis Sister   . Edema Sister    History  Substance Use Topics  . Smoking status: Former Smoker    Types: Pipe, Landscape architect  . Smokeless tobacco: Not on file  . Alcohol Use: No    Review of Systems 10 Systems reviewed and are negative for acute change except as noted in the HPI.    Allergies  Review of patient's allergies indicates no known allergies.  Home Medications   Prior to Admission medications   Medication Sig Start Date End Date Taking? Authorizing Provider  Ascorbic Acid (VITAMIN C) 1000 MG tablet Take 2,000 mg by mouth 2 (two) times daily.   Yes Historical Provider, MD  aspirin 325 MG tablet Take 1 tablet (325 mg total) by mouth daily. 03/22/12  Yes Cletus Gash, MD  BIOFLAVONOIDS PO Take 1 tablet by mouth 2 (two) times daily.   Yes Historical Provider, MD  carvedilol (COREG) 25 MG tablet TAKE 1 TABLET BY MOUTH TWICE DAILY 03/18/14  Yes Tawanna Solo  Gwendlyn Deutscher, MD  finasteride (PROSCAR) 5 MG tablet Take 5 mg by mouth daily.   Yes Historical Provider, MD  fluticasone (FLONASE) 50 MCG/ACT nasal spray Place 2 sprays into both nostrils daily. 05/20/14  Yes Hilton Sinclair, MD  furosemide (LASIX) 40 MG tablet Take 40 mg by mouth daily.   Yes Historical Provider, MD  HYDROcodone-homatropine (HYCODAN) 5-1.5 MG/5ML syrup Take 5 mLs by mouth every 6 (six) hours as needed for cough. 05/23/14  Yes Margarita Mail, PA-C  ibuprofen (ADVIL,MOTRIN) 800 MG tablet Take 800 mg by mouth every 12 (twelve) hours as needed for headache or moderate pain.   Yes Historical Provider, MD  lovastatin (MEVACOR) 20 MG tablet Take 1 tablet (20 mg total) by mouth at bedtime. 01/28/14  Yes  Andrena Mews, MD  Multiple Vitamins-Minerals (MULTIVITAMIN WITH MINERALS) tablet Take 1 tablet by mouth daily.   Yes Historical Provider, MD  omega-3 acid ethyl esters (LOVAZA) 1 G capsule Take 2 g by mouth 2 (two) times daily.   Yes Historical Provider, MD  potassium chloride SA (K-DUR,KLOR-CON) 20 MEQ tablet TAKE 1 TABLET BY MOUTH TWICE A WEEK 04/07/14  Yes Andrena Mews, MD  tamsulosin (FLOMAX) 0.4 MG CAPS capsule Take 0.4 mg by mouth daily.    Yes Historical Provider, MD  traMADol (ULTRAM) 50 MG tablet Take 50 mg by mouth every 8 (eight) hours as needed (pain).   Yes Historical Provider, MD  traZODone (DESYREL) 150 MG tablet Take 150 mg by mouth at bedtime.   Yes Historical Provider, MD  triamterene-hydrochlorothiazide (MAXZIDE) 75-50 MG per tablet TAKE 1 TABLET BY MOUTH DAILY 04/10/14  Yes Andrena Mews, MD  azithromycin (ZITHROMAX Z-PAK) 250 MG tablet 2 po day one, then 1 daily x 4 days Patient not taking: Reported on 06/01/2014 05/23/14   Margarita Mail, PA-C  benzonatate (TESSALON) 100 MG capsule Take 1 capsule (100 mg total) by mouth 2 (two) times daily as needed for cough. Patient not taking: Reported on 06/01/2014 05/20/14   Hilton Sinclair, MD  traMADol (ULTRAM) 50 MG tablet TAKE 1 TABLET BY MOUTH EVERY 8 HOURS AS NEEDED Patient not taking: Reported on 06/01/2014 05/06/14   Andrena Mews, MD  traZODone (DESYREL) 150 MG tablet TAKE 1 TABLET BY MOUTH EVERY NIGHT AT BEDTIME Patient not taking: Reported on 06/01/2014 03/05/14   Alveda Reasons, MD   BP 149/64 mmHg  Pulse 87  Resp 25  SpO2 96% Physical Exam  Constitutional: He is oriented to person, place, and time. He appears well-developed and well-nourished.  HENT:  Head: Normocephalic and atraumatic.  Eyes: EOM are normal. Pupils are equal, round, and reactive to light.  Neck: Neck supple.  Cardiovascular: Normal rate, regular rhythm, normal heart sounds and intact distal pulses.   Pulmonary/Chest: Effort normal and breath  sounds normal.  Abdominal: Soft. Bowel sounds are normal. He exhibits no distension. There is no tenderness.  Musculoskeletal: Normal range of motion. He exhibits no edema.  Neurological: He is alert and oriented to person, place, and time. He has normal strength. Coordination normal. GCS eye subscore is 4. GCS verbal subscore is 5. GCS motor subscore is 6.  Skin: Skin is warm, dry and intact.  Psychiatric: He has a normal mood and affect.    ED Course  Procedures (including critical care time) Labs Review Labs Reviewed  CBC - Abnormal; Notable for the following:    HCT 38.8 (*)    Platelets 125 (*)    All other components within normal limits  COMPREHENSIVE METABOLIC PANEL - Abnormal; Notable for the following:    Sodium 131 (*)    Potassium 3.3 (*)    Chloride 90 (*)    Glucose, Bld 126 (*)    Total Bilirubin 1.5 (*)    GFR calc non Af Amer 56 (*)    GFR calc Af Amer 64 (*)    All other components within normal limits  D-DIMER, QUANTITATIVE - Abnormal; Notable for the following:    D-Dimer, Quant 2.19 (*)    All other components within normal limits  CBG MONITORING, ED - Abnormal; Notable for the following:    Glucose-Capillary 131 (*)    All other components within normal limits  URINALYSIS, ROUTINE W REFLEX MICROSCOPIC  I-STAT TROPOININ, ED    Imaging Review Dg Chest 2 View  06/01/2014   CLINICAL DATA:  Syncopal episode. Loss of consciousness. Productive cough.  EXAM: CHEST  2 VIEW  COMPARISON:  Two-view chest 05/23/2014.  FINDINGS: The heart size is normal. Progressive left basilar airspace disease is concerning for developing pneumonia. The lungs are clear. The lungs are otherwise clear. The visualized soft tissues and bony thorax are otherwise unremarkable.  IMPRESSION: 1. Progressive left lower lobe airspace disease concerning for developing pneumonia.   Electronically Signed   By: Lawrence Santiago M.D.   On: 06/01/2014 17:20   Ct Head Wo Contrast  06/01/2014   CLINICAL  DATA:  Initial evaluation for syncope, patient does not remember advanced, on arrival with emergency medical services patient was pale with pinpoint pupils, personal history of right bundle-branch block with congestive heart failure and hypertension  EXAM: CT HEAD WITHOUT CONTRAST  TECHNIQUE: Contiguous axial images were obtained from the base of the skull through the vertex without intravenous contrast.  COMPARISON:  10/29/2011  FINDINGS: There is moderate age-related atrophy with mild low attenuation in the deep white matter. There is no evidence of mass. There is no hemorrhage or extra-axial fluid. There is no evidence of vascular territory infarct. Calvarium is intact. There is a 15 mm heavily calcified posterior scalp lesion near the vertex. This is stable from 2013.  IMPRESSION: Chronic involutional change with no acute findings.   Electronically Signed   By: Skipper Cliche M.D.   On: 06/01/2014 17:40   Ct Angio Chest Pe W/cm &/or Wo Cm  06/01/2014   CLINICAL DATA:  Initial evaluation for loss of consciousness  EXAM: CT ANGIOGRAPHY CHEST WITH CONTRAST  TECHNIQUE: Multidetector CT imaging of the chest was performed using the standard protocol during bolus administration of intravenous contrast. Multiplanar CT image reconstructions and MIPs were obtained to evaluate the vascular anatomy.  CONTRAST:  152mL OMNIPAQUE IOHEXOL 350 MG/ML SOLN  COMPARISON:  None.  FINDINGS: The inferior posterior tip of the right lower lobe is excluded from the study. Both lung bases demonstrate atelectasis. There is no other infiltrate or consolidation.  Motion artifact limits evaluation of more peripheral lobe are branches in the upper lobes. Allowing for this, no filling defects are identified in the pulmonary arterial system. Motion also limits evaluation of lobar branches in the lower lobes particularly on the right.  Thoracic aorta shows no dissection or dilatation. There is coronary artery calcification. There is mild to  moderate cardiac enlargement.  There is no pleural or pericardial effusion. There is no significant hilar or mediastinal adenopathy.  Thoracic inlet and thyroid are normal. Images through the upper abdomen demonstrate at least 2 gallstones the larger of which measures at least 2 cm. The osseous  thorax is intact.  Review of the MIP images confirms the above findings.  IMPRESSION: *Motion limits evaluation of lobar branches. Otherwise, no evidence of pulmonary embolism. *Inferior tip of right lower lobe excluded from the study. Bibasilar atelectasis. *Cholelithiasis   Electronically Signed   By: Skipper Cliche M.D.   On: 06/01/2014 20:17     EKG Interpretation None      MDM   Final diagnoses:  Syncope and collapse   At this time the patient has ruled out for pulmonary embolus. EKG does not show an acute ischemic pattern in for second cardiac enzymes are negative. Patient will be admitted for continued monitoring for possible dysrhythmia. Patient describes an unprovoked syncope. His current condition is good with normal mental status and stable vital signs.    Charlesetta Shanks, MD 06/01/14 2043

## 2014-06-01 NOTE — H&P (Signed)
[facility] Admission History and Physical Service Pager: [phone]  Patient name: Patrick [name] Medical record number: [id number] Date of birth: [birth year] Age: 64 y.o. Gender: male  Primary Care Provider: Andrena Mews, MD Consultants: None Code Status: Full   Chief Complaint:  Syncope   Assessment and Plan: Patrick [name] is a 64 y.o. male presenting with sycope. PMH is significant for CHF, HTN, HLD  #Syncopal episode: DDx vasovagal vs hypovolemia/orthostatic related vs cardiogenic. Most likely would be vasovagal vs orthostatic (given poor PO intake and aggressive diuretics). Unfortunately, patient does have a significant cardiac history with a RBBB and 1st degree AV block on EKG (appears stable from prior EKGs). CT of head unrevealing. Labs unremarkable currently. - Observe on tele, attending Dr. Lindell Noe - Orthostatic vital signs on admission - IVFs at 75cc/hr - repeat EKG in the AM - trend troponins - repeat TSH as patient has hypothyroidism on his problem list but he is not on medication (euthymic in 2014) - obtain and echocardiogram  - holding Coreg (given 1st degree heart block and RBBB) and other anti-hypertensives given soft BPs in the ED 90s/60s.  #Cough: Less likely ACE induced as the patient discontinued this on 12/25 without improvement in cough. Could be infectious in nature. CXR with concerns for developing L lower lobe PNA however not noted on CTA. No fever, crackles, or leukocytosis on exam.  - Continue Tessalon Perles and Hycodan  - Continue to monitor O2 sats  - Will start Protonix for possible GERD-related variant.  - Low threshold for starting Abx  #CHF: Echo in 2009 revealed an EF of 45% with global hypokinesis. Patient has not followed up Dr. Percival Spanish since 2012. Appears hypovolemic on exam this evening. - Holding Lasix and Coreg  - Has not been on lisinopril x 1.5wks - Will repeat echo.  - Daily weights - Continue to monitor fluid  status given IVFs   #HTN: BPs soft in the ED, around 90s/50s. Patient states he stopped taking lisinopril around Christmas. - Holding home anti-hypertensives of Maxide, Lasix, and Coreg given soft BPs - Consider avoiding numerous diuretics in this patient (?discontinue Maxide on discharge). Would prefer ACEI and home Beta blocker given PMH.  - Continue to monitor BPs and add back anti-hypertensives appropriately   #Hypokalemia: Possibly secondary to Lasix in combination to HCTZ. - Replete with  23mEq on admission  - continue to monitor   #BPH: Stable. Followed by [facility].  - continue finasteride and Flomax  # HLD: Stable - continue Pravastatin 20mg  daily   #T2DM: Diet controlled. - Continue to monitor glucose with daily CBG - If elevated, consider adding SSI.  FEN/GI: NS @ 75cc/hr, heart healthy/carb modified  Prophylaxis: Heparin SQ  Disposition: Observe in tele, attending Dr. Lindell Noe  History of Present Illness: Patrick [name] is a 64 y.o. male presenting with for a cough x 2 weeks and a syncopal episode this evening.   For the last 2 days he hasn't "wanted to do anything" and he has continued cough that is occasionally productive of a clear sputum.  Has been feeling warm for the last day, "particularlly on his face."  He was washing his sister's BiPAP machine, set it on the refrigerator to dry and went to get another piece when he felt nauseous and his vision went blurry. He put his head down on his arms, stayed there for a few seconds, and then proceeded with his work and the next thing he knew, he woke  up on the floor, however he does not know how he got there.  He states he feels like he may have had a coughing spell prior to the event. No chest pain/discomfort or palpitations prior to passing out.   He does not recall passing out but his sister said he did not respond so she called EMS.  The patient admits that he has not eaten or drank anything today, his last meal was  yesterday around 10pm. No abdominal pain, constipation or diarrhea.  No SOB at rest or with exertion. No chest pain.   Of note, he saw Dr. Dianah Field on 12/22 for cough/sore throat and was prescribed Tessalon Perles however his cough didn't improve.  He returned to the ED on 12/25 and was prescribed a Z-pack without improvement. At that time, his BP was in the 90s/50s therefore he was instructed to discontinue his Lasix.   In the ED, a CXR was obtained, EKG appear unchanged, and he was started on NS at 100cc/hr. A d-dimer was elevated therefor a CT   Review Of Systems: Per HPI with the following additions: None  Otherwise 12 point review of systems was performed and was unremarkable.  Patient Active Problem List   Diagnosis Date Noted  . Healthcare maintenance 03/11/2014  . Obesity, unspecified 10/03/2013  . Hyperlipidemia 08/13/2013  . KNEE PAIN, RIGHT 01/26/2010  . CARDIAC MURMUR 01/26/2010  . MACULAR DEGENERATION 05/02/2008  . INSOMNIA 10/02/2007  . HYPOTHYROIDISM 08/21/2007  . SMALL BOWEL OBSTRUCTION, HX OF 08/21/2007  . ERECTILE DYSFUNCTION 03/13/2007  . DEPRESSION 02/06/2007  . Diet-controlled type 2 diabetes mellitus 01/16/2007  . Essential hypertension 01/16/2007  . CONGESTIVE HEART FAILURE 01/16/2007  . CHOLELITHIASIS 01/16/2007  . BENIGN PROSTATIC HYPERTROPHY, HX OF 01/16/2007   Past Medical History: Past Medical History  Diagnosis Date  . CHF (congestive heart failure)   . Hypertension   . DM II (diabetes mellitus, type II), controlled     Diet controlled.   . Hyperlipidemia   . Degenerative joint disease of knee, right   . Enlarged prostate    Past Surgical History: Past Surgical History  Procedure Laterality Date  . Hernia repair     Social History: History  Substance Use Topics  . Smoking status: Former Smoker    Types: Pipe, Landscape architect  . Smokeless tobacco: Not on file  . Alcohol Use: No   Additional social history: Doesn't drink alcohol or smoke.  Lives with his sister and is her primary caregiver.   Please also refer to relevant sections of EMR.  Family History: Family History  Problem Relation Age of Onset  . Hypertension Mother   . Diabetes Mother   . Stroke Mother   . Cancer Father 9    Prostate  . Dementia Father   . COPD Sister   . Arthritis Sister   . Edema Sister    Allergies and Medications: No Known Allergies No current facility-administered medications on file prior to encounter.   Current Outpatient Prescriptions on File Prior to Encounter  Medication Sig Dispense Refill  . Ascorbic Acid (VITAMIN C) 1000 MG tablet Take 2,000 mg by mouth 2 (two) times daily.    Marland Kitchen aspirin 325 MG tablet Take 1 tablet (325 mg total) by mouth daily. 30 tablet 2  . BIOFLAVONOIDS PO Take 1 tablet by mouth 2 (two) times daily.    . carvedilol (COREG) 25 MG tablet TAKE 1 TABLET BY MOUTH TWICE DAILY 180 tablet 1  . finasteride (PROSCAR) 5 MG tablet  Take 5 mg by mouth daily.    . fluticasone (FLONASE) 50 MCG/ACT nasal spray Place 2 sprays into both nostrils daily. 16 g 6  . furosemide (LASIX) 40 MG tablet Take 40 mg by mouth daily.    Marland Kitchen HYDROcodone-homatropine (HYCODAN) 5-1.5 MG/5ML syrup Take 5 mLs by mouth every 6 (six) hours as needed for cough. 118 mL 0  . ibuprofen (ADVIL,MOTRIN) 800 MG tablet Take 800 mg by mouth every 12 (twelve) hours as needed for headache or moderate pain.    Marland Kitchen lovastatin (MEVACOR) 20 MG tablet Take 1 tablet (20 mg total) by mouth at bedtime. 90 tablet 1  . Multiple Vitamins-Minerals (MULTIVITAMIN WITH MINERALS) tablet Take 1 tablet by mouth daily.    Marland Kitchen omega-3 acid ethyl esters (LOVAZA) 1 G capsule Take 2 g by mouth 2 (two) times daily.    . potassium chloride SA (K-DUR,KLOR-CON) 20 MEQ tablet TAKE 1 TABLET BY MOUTH TWICE A WEEK 25 tablet 1  . tamsulosin (FLOMAX) 0.4 MG CAPS capsule Take 0.4 mg by mouth daily.     Marland Kitchen triamterene-hydrochlorothiazide (MAXZIDE) 75-50 MG per tablet TAKE 1 TABLET BY MOUTH DAILY 30  tablet 3  . azithromycin (ZITHROMAX Z-PAK) 250 MG tablet 2 po day one, then 1 daily x 4 days (Patient not taking: Reported on 06/01/2014) 5 tablet 0  . benzonatate (TESSALON) 100 MG capsule Take 1 capsule (100 mg total) by mouth 2 (two) times daily as needed for cough. (Patient not taking: Reported on 06/01/2014) 20 capsule 0  . traMADol (ULTRAM) 50 MG tablet TAKE 1 TABLET BY MOUTH EVERY 8 HOURS AS NEEDED (Patient not taking: Reported on 06/01/2014) 90 tablet 2  . traZODone (DESYREL) 150 MG tablet TAKE 1 TABLET BY MOUTH EVERY NIGHT AT BEDTIME (Patient not taking: Reported on 06/01/2014) 90 tablet 0    Objective: BP 105/53 mmHg  Pulse 88  Resp 34  SpO2 93% Exam: General: Lying in bed watching football in NAD.  HEENT: Atraumatic. Normocephalic. PEERL Dry MM, no erythema/exudate.  Cardiovascular: RRR, no m/r/g noted. Bounding DP pulses bilaterally Respiratory: CTAB without wheezing, rhonchi or crackles noted. Breathing comfortably on RA Abdomen: +BS, soft, NT/ND Extremities: No gross deformities  Skin: No rashes noted Neuro: A&O. No gross neurologic deficits.   Labs and Imaging: CBC BMET   Recent Labs Lab 06/01/14 1638  WBC 9.9  HGB 13.7  HCT 38.8*  PLT 125*    Recent Labs Lab 06/01/14 1638  NA 131*  K 3.3*  CL 90*  CO2 31  BUN 13  CREATININE 1.31  GLUCOSE 126*  CALCIUM 9.3    D-dimer: 2.19 EKG: NSR, HR 66, 1st degree AV block, RBBB, No ST elevation/depressions. No significant change from previous EKGs.   CXR: Progressive left lower lobe airspace disease concerning for developing pneumonia.  CT heat: There is moderate age-related atrophy with mild low attenuation in the deep white matter. There is no evidence of mass. There is no hemorrhage or extra-axial fluid. There is no evidence of vascular territory infarct. Calvarium is intact. There is a 15 mm heavily calcified posterior scalp lesion near the vertex. This is stable from 2013. IMPRESSION: Chronic involutional change  with no acute findings.  CTA chest: *Motion limits evaluation of lobar branches. Otherwise, no evidence of pulmonary embolism. *Inferior tip of right lower lobe excluded from the study. Bibasilar atelectasis. *Cholelithiasis  Archie Patten, MD 06/01/2014, 9:33 PM PGY-1, [facility] pager: [phone], text pages welcome  I have seen  and evaluated the above patient.  I agree with the note. Addendum in blue.  [facility] PGY-3 Pager: [phone]

## 2014-06-02 DIAGNOSIS — R5081 Fever presenting with conditions classified elsewhere: Secondary | ICD-10-CM

## 2014-06-02 DIAGNOSIS — R55 Syncope and collapse: Secondary | ICD-10-CM | POA: Diagnosis not present

## 2014-06-02 DIAGNOSIS — E119 Type 2 diabetes mellitus without complications: Secondary | ICD-10-CM | POA: Diagnosis not present

## 2014-06-02 DIAGNOSIS — R778 Other specified abnormalities of plasma proteins: Secondary | ICD-10-CM | POA: Insufficient documentation

## 2014-06-02 DIAGNOSIS — R05 Cough: Secondary | ICD-10-CM | POA: Diagnosis not present

## 2014-06-02 DIAGNOSIS — R7989 Other specified abnormal findings of blood chemistry: Secondary | ICD-10-CM

## 2014-06-02 DIAGNOSIS — I509 Heart failure, unspecified: Secondary | ICD-10-CM | POA: Diagnosis not present

## 2014-06-02 LAB — BASIC METABOLIC PANEL
Anion gap: 11 (ref 5–15)
BUN: 16 mg/dL (ref 6–23)
CHLORIDE: 93 meq/L — AB (ref 96–112)
CO2: 29 mmol/L (ref 19–32)
Calcium: 8.9 mg/dL (ref 8.4–10.5)
Creatinine, Ser: 1.38 mg/dL — ABNORMAL HIGH (ref 0.50–1.35)
GFR calc non Af Amer: 52 mL/min — ABNORMAL LOW (ref >90)
GFR, EST AFRICAN AMERICAN: 60 mL/min — AB (ref >90)
Glucose, Bld: 96 mg/dL (ref 70–99)
Potassium: 3.3 mmol/L — ABNORMAL LOW (ref 3.5–5.1)
Sodium: 133 mmol/L — ABNORMAL LOW (ref 135–145)

## 2014-06-02 LAB — CLOSTRIDIUM DIFFICILE BY PCR: Toxigenic C. Difficile by PCR: NEGATIVE

## 2014-06-02 LAB — CBC
HEMATOCRIT: 35.6 % — AB (ref 39.0–52.0)
HEMOGLOBIN: 12.5 g/dL — AB (ref 13.0–17.0)
MCH: 28.8 pg (ref 26.0–34.0)
MCHC: 35.1 g/dL (ref 30.0–36.0)
MCV: 82 fL (ref 78.0–100.0)
Platelets: 135 10*3/uL — ABNORMAL LOW (ref 150–400)
RBC: 4.34 MIL/uL (ref 4.22–5.81)
RDW: 14.1 % (ref 11.5–15.5)
WBC: 10.1 10*3/uL (ref 4.0–10.5)

## 2014-06-02 LAB — TROPONIN I
TROPONIN I: 0.08 ng/mL — AB (ref <0.031)
TROPONIN I: 0.09 ng/mL — AB (ref <0.031)
Troponin I: 0.04 ng/mL — ABNORMAL HIGH (ref <0.031)
Troponin I: 0.05 ng/mL — ABNORMAL HIGH (ref <0.031)

## 2014-06-02 LAB — GLUCOSE, CAPILLARY
GLUCOSE-CAPILLARY: 139 mg/dL — AB (ref 70–99)
GLUCOSE-CAPILLARY: 99 mg/dL (ref 70–99)

## 2014-06-02 LAB — TSH: TSH: 0.531 u[IU]/mL (ref 0.350–4.500)

## 2014-06-02 MED ORDER — LEVOFLOXACIN 750 MG PO TABS
750.0000 mg | ORAL_TABLET | Freq: Every day | ORAL | Status: DC
  Administered 2014-06-02 – 2014-06-03 (×2): 750 mg via ORAL
  Filled 2014-06-02 (×2): qty 1

## 2014-06-02 MED ORDER — POTASSIUM CHLORIDE CRYS ER 20 MEQ PO TBCR: 80.0000 meq | EXTENDED_RELEASE_TABLET | Freq: Two times a day (BID) | ORAL | Status: DC

## 2014-06-02 MED ORDER — POTASSIUM CHLORIDE CRYS ER 20 MEQ PO TBCR
40.0000 meq | EXTENDED_RELEASE_TABLET | Freq: Two times a day (BID) | ORAL | Status: DC
  Administered 2014-06-02: 40 meq via ORAL
  Filled 2014-06-02: qty 2

## 2014-06-02 NOTE — Progress Notes (Signed)
Family Medicine Teaching Service Daily Progress Note Intern Pager: [phone]  Patient name: Patrick [name] Medical record number: [id number] Date of birth: [birth year] Age: 64 y.o. Gender: male  Primary Care Provider: Andrena Mews, MD Consultants: Cardiology Code Status: Full  Pt Overview and Major Events to Date:  1/3 - Admitted for observation for syncopal episode 1/4 - cards consulted, echo   Assessment and Plan: Patrick [name] is a 64 y.o. male presenting with sycope. PMH is significant for CHF, HTN, HLD  #Syncopal episode: DDx vasovagal vs hypovolemia/orthostatic related vs cardiogenic. Most likely would be vasovagal vs orthostatic (given poor PO intake and aggressive diuretics). Unfortunately, patient does have a significant cardiac history with a RBBB and 1st degree AV block on EKG (appears stable from prior EKGs). CT of head unrevealing. Labs unremarkable currently. - Orthostatic vital signs were negative - IVFs at 75cc/hr - repeat EKG with no significant change from prior - trend troponins - most recent troponin 0.04, elevated -will consult cards for elevated troponin and EKG changes - repeat TSH - 0.0531 - echocardiogram pending - holding Coreg (given 1st degree heart block and RBBB) and other anti-hypertensives given soft BPs in the ED 90s/60s.  #Cough: Less likely ACE induced as the patient discontinued this on 12/25 without improvement in cough. Most likely infectious in nature; viral process such as a bronchitis. CXR with concerns for developing L lower lobe PNA however not noted on CTA. No leukocytosis on exam. Febrile last night to 101.6.  - Continue Tessalon Perles and Hycodan  - Continue to monitor O2 sats - on RA - If worsening, consider repeat CXR to evaluate for evolving PNA - no need for antibiotics at this time but will monitor - Will start Protonix for possible GERD-related variant.   #CHF: Echo in 2009 revealed an EF of 45% with global hypokinesis. Patient has not  followed up Dr. Percival Spanish since 2012. Appears hypovolemic on exam this evening. - Holding Lasix and Coreg  - Has not been on lisinopril x 1.5wks - Will repeat echo -pending - Daily weights - Continue to monitor fluid status given IVFs   #HTN: BPs soft in the ED, around 90s/50s. Patient states he stopped taking lisinopril around Christmas. - Holding home anti-hypertensives of Maxide, Lasix, and Coreg given soft BPs - Consider avoiding numerous diuretics in this patient (?discontinue Maxide on discharge) - Continue to monitor BPs and add back anti-hypertensives appropriately   #Hypokalemia: Possibly secondary to Lasix in combination to HCTZ. Today 3.3.  - Replete with30mEq  - continue to monitor   #Diarrhea: new-onset as of 1/3. Will continue to monitor -enteric precautions -C.diff was negative  #BPH: Stable. Followed by [facility].  - continue finasteride and Flomax  # HLD: Stable - continue Pravastatin 20mg  daily   #T2DM: Diet controlled. - Continue to monitor glucose with daily CBG - If elevated, consider adding SSI.  FEN/GI: NS @ 75cc/hr, heart healthy/carb modified  Prophylaxis: Heparin SQ   Disposition: Pending clinical improvement and cardiology recs.  Subjective:  Patient still wondering what could have caused his syncopal episode. He is also still having this cough which does not seem to be improving. Otherwise no concerns.   Objective: Temp:  [98.4 F (36.9 C)-101.6 F (38.7 C)] 98.4 F (36.9 C) (01/04 0314) Pulse Rate:  [65-88] 86 (01/04 0314) Resp:  [16-34] 20 (01/04 0314) BP: (99-149)/(45-73) 124/64 mmHg (01/04 0314) SpO2:  [92 %-99 %] 93 % (01/04 0314) Weight:  [206 lb 2.1 oz (93.5 kg)] 206  lb 2.1 oz (93.5 kg) (01/03 2221) Physical Exam:  General: Pleasant, NAD. Coughing.  HEENT: Atraumatic. Normocephalic. MMM. EOMI. no erythema/exudate.  Cardiovascular: RRR, no m/r/g noted.  Respiratory: CTAB without wheezing, rhonchi or crackles noted.  Breathing comfortably on RA Abdomen: +BS, soft, NT/ND Extremities: No gross deformities  Skin: No rashes noted Neuro: A&O. No gross neurologic deficits.   Laboratory: Results for orders placed or performed during the hospital encounter of 06/01/14 (from the past 24 hour(s))  CBG monitoring, ED     Status: Abnormal   Collection Time: 06/01/14  3:44 PM  Result Value Ref Range   Glucose-Capillary 131 (H) 70 - 99 mg/dL  Urinalysis, Routine w reflex microscopic     Status: None   Collection Time: 06/01/14  4:26 PM  Result Value Ref Range   Color, Urine YELLOW YELLOW   APPearance CLEAR CLEAR   Specific Gravity, Urine 1.007 1.005 - 1.030   pH 7.0 5.0 - 8.0   Glucose, UA NEGATIVE NEGATIVE mg/dL   Hgb urine dipstick NEGATIVE NEGATIVE   Bilirubin Urine NEGATIVE NEGATIVE   Ketones, ur NEGATIVE NEGATIVE mg/dL   Protein, ur NEGATIVE NEGATIVE mg/dL   Urobilinogen, UA 0.2 0.0 - 1.0 mg/dL   Nitrite NEGATIVE NEGATIVE   Leukocytes, UA NEGATIVE NEGATIVE  CBC     Status: Abnormal   Collection Time: 06/01/14  4:38 PM  Result Value Ref Range   WBC 9.9 4.0 - 10.5 K/uL   RBC 4.73 4.22 - 5.81 MIL/uL   Hemoglobin 13.7 13.0 - 17.0 g/dL   HCT 38.8 (L) 39.0 - 52.0 %   MCV 82.0 78.0 - 100.0 fL   MCH 29.0 26.0 - 34.0 pg   MCHC 35.3 30.0 - 36.0 g/dL   RDW 14.1 11.5 - 15.5 %   Platelets 125 (L) 150 - 400 K/uL  Comprehensive metabolic panel     Status: Abnormal   Collection Time: 06/01/14  4:38 PM  Result Value Ref Range   Sodium 131 (L) 135 - 145 mmol/L   Potassium 3.3 (L) 3.5 - 5.1 mmol/L   Chloride 90 (L) 96 - 112 mEq/L   CO2 31 19 - 32 mmol/L   Glucose, Bld 126 (H) 70 - 99 mg/dL   BUN 13 6 - 23 mg/dL   Creatinine, Ser 1.31 0.50 - 1.35 mg/dL   Calcium 9.3 8.4 - 10.5 mg/dL   Total Protein 7.4 6.0 - 8.3 g/dL   Albumin 4.0 3.5 - 5.2 g/dL   AST 22 0 - 37 U/L   ALT 18 0 - 53 U/L   Alkaline Phosphatase 67 39 - 117 U/L   Total Bilirubin 1.5 (H) 0.3 - 1.2 mg/dL   GFR calc non Af Amer 56 (L) >90  mL/min   GFR calc Af Amer 64 (L) >90 mL/min   Anion gap 10 5 - 15  D-dimer, quantitative     Status: Abnormal   Collection Time: 06/01/14  4:38 PM  Result Value Ref Range   D-Dimer, Quant 2.19 (H) 0.00 - 0.48 ug/mL-FEU  I-stat troponin, ED     Status: None   Collection Time: 06/01/14  4:49 PM  Result Value Ref Range   Troponin i, poc 0.01 0.00 - 0.08 ng/mL   Comment 3          TSH     Status: None   Collection Time: 06/01/14 10:56 PM  Result Value Ref Range   TSH 0.531 0.350 - 4.500 uIU/mL  Troponin I  Status: None   Collection Time: 06/01/14 10:56 PM  Result Value Ref Range   Troponin I 0.03 <0.031 ng/mL  CBC     Status: Abnormal   Collection Time: 06/02/14  4:45 AM  Result Value Ref Range   WBC 10.1 4.0 - 10.5 K/uL   RBC 4.34 4.22 - 5.81 MIL/uL   Hemoglobin 12.5 (L) 13.0 - 17.0 g/dL   HCT 35.6 (L) 39.0 - 52.0 %   MCV 82.0 78.0 - 100.0 fL   MCH 28.8 26.0 - 34.0 pg   MCHC 35.1 30.0 - 36.0 g/dL   RDW 14.1 11.5 - 15.5 %   Platelets 135 (L) 150 - 400 K/uL    Imaging/Diagnostic Tests: Dg Chest 2 View 06/01/2014  IMPRESSION: 1. Progressive left lower lobe airspace disease concerning for developing pneumonia.    Ct Head Wo Contrast 06/01/2014   IMPRESSION: Chronic involutional change with no acute findings.     Ct Angio Chest Pe W/cm &/or Wo Cm 06/01/2014  IMPRESSION: *Motion limits evaluation of lobar branches. Otherwise, no evidence of pulmonary embolism. *Inferior tip of right lower lobe excluded from the study. Bibasilar atelectasis. *Cholelithiasis    Katheren Shams, DO 06/02/2014, 5:54 AM PGY-1, [place] [facility] pager: [phone], text pages welcome

## 2014-06-02 NOTE — Progress Notes (Signed)
UR completed 

## 2014-06-02 NOTE — Consult Note (Signed)
CARDIOLOGY CONSULT NOTE   Patient ID: Patrick [name] MRN: [id number], DOB/AGE: [birth year]   Admit date: 06/01/2014 Date of Consult: 06/02/2014   Primary Physician: Andrena Mews, MD Primary Cardiologist:  Dr. Percival Spanish remotely (2012)  Pt. Profile  64 year old gentleman admitted with syncope at home.  He has a past history of congestive heart failure.  Problem List  Past Medical History  Diagnosis Date  . CHF (congestive heart failure)   . Hypertension   . DM II (diabetes mellitus, type II), controlled     Diet controlled.   . Hyperlipidemia   . Degenerative joint disease of knee, right   . Enlarged prostate     Past Surgical History  Procedure Laterality Date  . Hernia repair       Allergies  No Known Allergies  HPI   This 64 year old gentleman was admitted on 06/01/14 after having an episode of syncope at home.  He was in the process of washing a BiPAP mask.  He did not have any chest discomfort that he recalls.  He does recall starting to feel nauseated and after that he began to have tunnel vision and loss of vision following which he had syncope.  He woke up on the floor still holding the BiPAP mask in his hand.  He was unresponsive only briefly apparently and there was no bowel or bladder incontinence.  He does not have any prior history of syncope.  He has had a recent chest cold.  He was recently started on Zithromax as an outpatient.  At the time of his syncope he had eaten or drunk very little and may well have been somewhat dehydrated.  He has a past history of hypertension and diabetes mellitus type 2 and hyperlipidemia.  He also has a history of mild systolic heart failure with previous echocardiogram on 05/09/08 showing an ejection fraction of 45%.  There was mild mitral regurgitation and mild LVH.  The patient does not have any history of ischemic heart disease.  He gives a history of mild exertional dyspnea.  He sleeps on one pillow and denies paroxysmal nocturnal  dyspnea.  He has not been aware of any palpitations or irregular pulse. He has a history of old RBBB.   Inpatient Medications  . aspirin EC  81 mg Oral Daily  . finasteride  5 mg Oral Daily  . fluticasone  2 spray Each Nare Daily  . heparin  5,000 Units Subcutaneous 3 times per day  . levofloxacin  750 mg Oral Daily  . pantoprazole  20 mg Oral Daily  . potassium chloride  40 mEq Oral BID  . pravastatin  20 mg Oral q1800  . sodium chloride  3 mL Intravenous Q12H  . tamsulosin  0.4 mg Oral Daily    Family History Family History  Problem Relation Age of Onset  . Hypertension Mother   . Diabetes Mother   . Stroke Mother   . Cancer Father 74    Prostate  . Dementia Father   . COPD Sister   . Arthritis Sister   . Edema Sister      Social History History   Social History  . Marital Status: Single    Spouse Name: N/A    Number of Children: N/A  . Years of Education: N/A   Occupational History  . Not on file.   Social History Main Topics  . Smoking status: Former Smoker    Types: Pipe, Landscape architect  . Smokeless tobacco: Not on file  .  Alcohol Use: No  . Drug Use: No  . Sexual Activity: Not on file   Other Topics Concern  . Not on file   Social History Narrative     Review of Systems  General:  No chills, fever, night sweats or weight changes.  Cardiovascular:  No chest pain, dyspnea on exertion, edema, orthopnea, palpitations, paroxysmal nocturnal dyspnea. Dermatological: No rash, lesions/masses Respiratory: No cough, dyspnea Urologic: No hematuria, dysuria Abdominal:   No nausea, vomiting, diarrhea, bright red blood per rectum, melena, or hematemesis Neurologic:  No visual changes, wkns, changes in mental status.  Positive for syncope All other systems reviewed and are otherwise negative except as noted above.  Physical Exam  Blood pressure 124/64, pulse 86, temperature 98.4 F (36.9 C), temperature source Oral, resp. rate 20, height 5\' 9"  (1.753 m), weight  206 lb 2.1 oz (93.5 kg), SpO2 93 %.  General: Pleasant, NAD Psych: Normal affect. Neuro: Alert and oriented X 3. Moves all extremities spontaneously. HEENT: Normal  Neck: Supple without bruits or JVD. Lungs:  Resp regular and unlabored, CTA. Heart: RRR no s3, s4, or murmurs. Abdomen: Soft, non-tender, non-distended, BS + x 4.  Extremities: No clubbing, cyanosis or edema. DP/PT/Radials 2+ and equal bilaterally.  Labs   Recent Labs  06/01/14 2256 06/02/14 0445  TROPONINI 0.03 0.04*   Lab Results  Component Value Date   WBC 10.1 06/02/2014   HGB 12.5* 06/02/2014   HCT 35.6* 06/02/2014   MCV 82.0 06/02/2014   PLT 135* 06/02/2014     Recent Labs Lab 06/01/14 1638 06/02/14 0445  NA 131* 133*  K 3.3* 3.3*  CL 90* 93*  CO2 31 29  BUN 13 16  CREATININE 1.31 1.38*  CALCIUM 9.3 8.9  PROT 7.4  --   BILITOT 1.5*  --   ALKPHOS 67  --   ALT 18  --   AST 22  --   GLUCOSE 126* 96   Lab Results  Component Value Date   CHOL 71 08/13/2013   HDL 36* 08/13/2013   LDLCALC NOT CALC 08/13/2013   TRIG 192* 08/13/2013   Lab Results  Component Value Date   DDIMER 2.19* 06/01/2014    Radiology/Studies  Dg Chest 2 View  06/01/2014   CLINICAL DATA:  Syncopal episode. Loss of consciousness. Productive cough.  EXAM: CHEST  2 VIEW  COMPARISON:  Two-view chest 05/23/2014.  FINDINGS: The heart size is normal. Progressive left basilar airspace disease is concerning for developing pneumonia. The lungs are clear. The lungs are otherwise clear. The visualized soft tissues and bony thorax are otherwise unremarkable.  IMPRESSION: 1. Progressive left lower lobe airspace disease concerning for developing pneumonia.   Electronically Signed   By: Lawrence Santiago M.D.   On: 06/01/2014 17:20   Dg Chest 2 View  05/23/2014   CLINICAL DATA:  1 week cough  EXAM: CHEST  2 VIEW  COMPARISON:  01/13/2009  FINDINGS: Upper normal heart size. Bibasilar atelectasis. Low volumes. No pleural effusion or  pneumothorax.  IMPRESSION: Bibasilar atelectasis.   Electronically Signed   By: Maryclare Bean M.D.   On: 05/23/2014 15:27   Ct Head Wo Contrast  06/01/2014   CLINICAL DATA:  Initial evaluation for syncope, patient does not remember advanced, on arrival with emergency medical services patient was pale with pinpoint pupils, personal history of right bundle-branch block with congestive heart failure and hypertension  EXAM: CT HEAD WITHOUT CONTRAST  TECHNIQUE: Contiguous axial images were obtained from the base of  the skull through the vertex without intravenous contrast.  COMPARISON:  10/29/2011  FINDINGS: There is moderate age-related atrophy with mild low attenuation in the deep white matter. There is no evidence of mass. There is no hemorrhage or extra-axial fluid. There is no evidence of vascular territory infarct. Calvarium is intact. There is a 15 mm heavily calcified posterior scalp lesion near the vertex. This is stable from 2013.  IMPRESSION: Chronic involutional change with no acute findings.   Electronically Signed   By: Skipper Cliche M.D.   On: 06/01/2014 17:40   Ct Angio Chest Pe W/cm &/or Wo Cm  06/01/2014   CLINICAL DATA:  Initial evaluation for loss of consciousness  EXAM: CT ANGIOGRAPHY CHEST WITH CONTRAST  TECHNIQUE: Multidetector CT imaging of the chest was performed using the standard protocol during bolus administration of intravenous contrast. Multiplanar CT image reconstructions and MIPs were obtained to evaluate the vascular anatomy.  CONTRAST:  139mL OMNIPAQUE IOHEXOL 350 MG/ML SOLN  COMPARISON:  None.  FINDINGS: The inferior posterior tip of the right lower lobe is excluded from the study. Both lung bases demonstrate atelectasis. There is no other infiltrate or consolidation.  Motion artifact limits evaluation of more peripheral lobe are branches in the upper lobes. Allowing for this, no filling defects are identified in the pulmonary arterial system. Motion also limits evaluation of lobar  branches in the lower lobes particularly on the right.  Thoracic aorta shows no dissection or dilatation. There is coronary artery calcification. There is mild to moderate cardiac enlargement.  There is no pleural or pericardial effusion. There is no significant hilar or mediastinal adenopathy.  Thoracic inlet and thyroid are normal. Images through the upper abdomen demonstrate at least 2 gallstones the larger of which measures at least 2 cm. The osseous thorax is intact.  Review of the MIP images confirms the above findings.  IMPRESSION: *Motion limits evaluation of lobar branches. Otherwise, no evidence of pulmonary embolism. *Inferior tip of right lower lobe excluded from the study. Bibasilar atelectasis. *Cholelithiasis   Electronically Signed   By: Skipper Cliche M.D.   On: 06/01/2014 20:17    ECG  Sinus rhythm with 1st degree A-V block with occasional Premature ventricular complexes and Fusion complexes Right bundle branch block Anteroseptal infarct , age undetermined T wave abnormality, consider lateral ischemia Abnormal ECG Personally reviewed.  No  Significant change since 2012.   ASSESSMENT AND PLAN  1.  Syncope probably secondary to dehydration and vasovagal reaction. 2.  Borderline elevated troponin 0.04.  Doubt acute coronary syndrome. 3.  Mild renal insufficiency.  Home Lasix is on hold.  Blood pressure in the emergency room was soft suggesting mild dehydration. 4.  Right bundle branch block--chronic 5 .  Mild left ventricular systolic dysfunction with previous ejection fraction 45% in 2009 6.  Diabetes mellitus 7.  Hyperlipidemia 8.  Hypokalemia--repletion per primary service Recommendation: Agree with workup as planned.  Agree with holding off on diuretics at this point.  His carvedilol will need to be resumed at a lower dosage at discharge given his soft blood pressure.  We are updating his echocardiogram.  Continue on telemetry while in the hospital.  So far he has had no  significant arrhythmias.  Sporadic PVCs are noted.  Continue to track troponin levels.  If no significant further elevation and if echocardiogram does not show significant deterioration in LV function, no further ischemic workup this admission.  Continue to treat for upper respiratory infection.  Can consider outpatient Myoview stress  test.  Needs follow-up with Dr. Michelle Piper after discharge.   Signed, Darlin Coco, MD  06/02/2014, 10:07 AM

## 2014-06-03 DIAGNOSIS — R55 Syncope and collapse: Secondary | ICD-10-CM | POA: Diagnosis not present

## 2014-06-03 DIAGNOSIS — I059 Rheumatic mitral valve disease, unspecified: Secondary | ICD-10-CM

## 2014-06-03 LAB — BASIC METABOLIC PANEL
Anion gap: 11 (ref 5–15)
BUN: 16 mg/dL (ref 6–23)
CALCIUM: 8.5 mg/dL (ref 8.4–10.5)
CHLORIDE: 100 meq/L (ref 96–112)
CO2: 24 mmol/L (ref 19–32)
Creatinine, Ser: 1.24 mg/dL (ref 0.50–1.35)
GFR calc Af Amer: 69 mL/min — ABNORMAL LOW (ref >90)
GFR calc non Af Amer: 59 mL/min — ABNORMAL LOW (ref >90)
Glucose, Bld: 96 mg/dL (ref 70–99)
POTASSIUM: 3.2 mmol/L — AB (ref 3.5–5.1)
SODIUM: 135 mmol/L (ref 135–145)

## 2014-06-03 LAB — GLUCOSE, CAPILLARY: Glucose-Capillary: 111 mg/dL — ABNORMAL HIGH (ref 70–99)

## 2014-06-03 LAB — TROPONIN I: Troponin I: 0.05 ng/mL — ABNORMAL HIGH (ref <0.031)

## 2014-06-03 MED ORDER — POTASSIUM CHLORIDE CRYS ER 20 MEQ PO TBCR
40.0000 meq | EXTENDED_RELEASE_TABLET | Freq: Once | ORAL | Status: AC
  Administered 2014-06-03: 40 meq via ORAL
  Filled 2014-06-03: qty 2

## 2014-06-03 MED ORDER — ASPIRIN 81 MG PO TBEC: 81.0000 mg | DELAYED_RELEASE_TABLET | Freq: Every day | ORAL | Status: DC

## 2014-06-03 MED ORDER — CARVEDILOL 6.25 MG PO TABS: 6.2500 mg | ORAL_TABLET | Freq: Two times a day (BID) | ORAL | Status: DC

## 2014-06-03 MED ORDER — SENNA 8.6 MG PO TABS: 1.0000 | ORAL_TABLET | Freq: Every day | ORAL | Status: DC | PRN

## 2014-06-03 MED ORDER — SENNA 8.6 MG PO TABS
1.0000 | ORAL_TABLET | Freq: Every day | ORAL | Status: DC | PRN
  Filled 2014-06-03: qty 1

## 2014-06-03 NOTE — Progress Notes (Signed)
    Subjective:  Denies CP or dyspnea   Objective:  Filed Vitals:   06/02/14 0314 06/02/14 1500 06/02/14 2100 06/03/14 0522  BP: 124/64 115/59 120/58 126/53  Pulse: 86 66 62 82  Temp: 98.4 F (36.9 C) 98 F (36.7 C) 98.2 F (36.8 C) 99.6 F (37.6 C)  TempSrc: Oral Oral Oral Oral  Resp: 20 20 20 18   Height:      Weight:    208 lb 12.4 oz (94.7 kg)  SpO2: 93% 98% 97% 96%    Intake/Output from previous day:  Intake/Output Summary (Last 24 hours) at 06/03/14 1046 Last data filed at 06/03/14 0525  Gross per 24 hour  Intake    120 ml  Output    825 ml  Net   -705 ml    Physical Exam: Physical exam: Well-developed well-nourished in no acute distress.  Skin is warm and dry.  HEENT is normal.  Neck is supple.  Chest is clear to auscultation with normal expansion.  Cardiovascular exam is regular rate and rhythm.  Abdominal exam nontender or distended. No masses palpated. Extremities show no edema. neuro grossly intact    Lab Results: Basic Metabolic Panel:  Recent Labs  06/02/14 0445 06/03/14 0528  NA 133* 135  K 3.3* 3.2*  CL 93* 100  CO2 29 24  GLUCOSE 96 96  BUN 16 16  CREATININE 1.38* 1.24  CALCIUM 8.9 8.5   CBC:  Recent Labs  06/01/14 1638 06/02/14 0445  WBC 9.9 10.1  HGB 13.7 12.5*  HCT 38.8* 35.6*  MCV 82.0 82.0  PLT 125* 135*   Cardiac Enzymes:  Recent Labs  06/02/14 1715 06/02/14 2156 06/03/14 0528  TROPONINI 0.09* 0.05* 0.05*     Assessment/Plan:  1 syncope-telemetry has been reviewed. This reveals sinus rhythm with PVCs and 3 beats of nonsustained ventricular tachycardia. By description the event sounds potentially vagally mediated. Await echocardiogram. If LV function unchanged patient can be discharged from a cardiac standpoint and follow-up with Dr. Percival Spanish. He has been instructed not to drive at least until that evaluation. If he has recurrent events would consider monitor. 2 minimal elevation in troponin-troponin is  borderline elevated but there is no clear trend up and he has had no chest pain. Would arrange outpatient nuclear study prior to evaluation by Dr. Percival Spanish. 3 possible pneumonia-antibiotics per primary care. 4 hypertension-blood pressure was borderline at time of admission and is controlled at present. I would discontinue Lasix and Maxide. Would decrease carvedilol to 6.25 mg twice a day at discharge. Follow his blood pressure at home and increase medications as needed. Low blood pressure and dehydration may have contributed to syncope. 5 hyperlipidemia-continue statin.  Kirk Ruths 06/03/2014, 10:46 AM

## 2014-06-03 NOTE — Progress Notes (Signed)
Family Medicine Teaching Service Daily Progress Note Intern Pager: [phone]  Patient name: Patrick [name] Medical record number: [id number] Date of birth: [birth year] Age: 64 y.o. Gender: male  Primary Care Provider: Andrena Mews, MD Consultants: Cardiology Code Status: Full  Pt Overview and Major Events to Date:  1/3 - Admitted for observation for syncopal episode 1/4 - Cards consulted, echo   Assessment and Plan: Patrick [name] is a 64 y.o. male presenting with sycope. PMH is significant for CHF, HTN, HLD, RBBB and 1st degree AV block on EKG  #Syncopal episode:  - Orthostatic vital signs were negative - Repeat EKG with no significant change from prior - Troponins: 0.08>0.09>0.05  - Consider cardiology consultation if Troponins continue to trend upward - D dimer 2.19 - TSH - 0.0531 - Follow Up Echocardiogram  - Cardiology Consulted. Appreciate recommendations.  - Suspect syncope is related to dehydration and vasovagal reaction  - Soft BPs support dehydration diagnosis  - Echocardiogram, continue telemetry, trend troponins  - If LV function unchanged on Echo, can be discharged  - Consider Myoview stress test and follow up with Dr. Michelle Piper at discharge. Should not drive until after f/u.  - Outpatient nuclear study  - Discontinue Lasix and Maxide. Decrease Carvedilol to 6.25mg  BID at discharge - NS@75  - Holding Coreg (given 1st degree heart block and RBBB) and other anti-hypertensives given soft BPs in the ED 90s/60s.  #Cough: Suspected to be viral in nature - Afebrile - CXR with concerns for developing L lower lobe PNA however not noted on CTA - Follow up Blood cultures - Continue Tessalon Perles and Hycodan (Hydrocodone-Homatropine)  - Continue to monitor O2 sats, Currently 96% on room air - If worsening, consider repeat CXR to evaluate for evolving PNA - no need for antibiotics at this time but will monitor - Will start Protonix for possible GERD-related variant.   #CHF: Echo  in 2009 revealed an EF of 45% with global hypokinesis. Patient has not followed up Dr. Percival Spanish since 2012.  - Holding Lasix and Coreg  - Has not been on lisinopril x 1.5wks. Cough not improved despite discontinuation - Follow up Echo - Daily weights-208 today up from 206 at admission - Continue to monitor fluid status given IVFs   #HTN:  - BP 125/53 this am - Holding home anti-hypertensives of Maxide, Lasix, and Coreg given soft BPs - Consider avoiding numerous diuretics in this patient (?discontinue Maxide on discharge) - Continue to monitor BPs and add back anti-hypertensives appropriately   #Hypokalemia: Possibly secondary to Lasix in combination to HCTZ.   - 3.2 today - Repleted with 34mEq Kdur - Continue to monitor   #Diarrhea: new-onset as of 1/3.  - C.diff was negative - Continue to monitor  FEN/GI: NS @ 75cc/hr, heart healthy/carb modified  Prophylaxis: Heparin SQ  Disposition: Pending clinical improvement and cardiology recs.  Subjective:  No acute complaints overnight. States he is doing well and would like to go home if possible. Denies shortness of breath or cough. Continues to note cough, however states it has improved since admission. Feels like he is able to take deeper breaths. Admits to constipation as well and would like medication to help with this. No further complaints today.  Initially admitted on 1/3 after episode of syncope at home. Notes that he had consumed very little on day of syncope.  Objective: Temp:  [98 F (36.7 C)-99.6 F (37.6 C)] 99.6 F (37.6 C) (01/05 0522) Pulse Rate:  [62-82] 82 (01/05 0522) Resp:  [  18-20] 18 (01/05 0522) BP: (115-126)/(53-59) 126/53 mmHg (01/05 0522) SpO2:  [96 %-98 %] 96 % (01/05 0522) Weight:  [208 lb 12.4 oz (94.7 kg)] 208 lb 12.4 oz (94.7 kg) (01/05 0522) Physical Exam:  General: 64yo male resting comfortably in no apparent distress. Coughing throughout exam Cardiovascular: S1 and S2 noted. No murmurs, rubs,  gallops. Regular rate and rhythm. Respiratory: Clear to auscultation bilaterally. No wheezes, rales, rhonchi. No increased work of breathing. Abdomen: Bowel sounds noted. Soft and nondistended. No tenderness to palpation. Extremities: No gross deformities  Skin: No rashes noted Neuro: A&O. No gross neurologic deficits.   Laboratory: Results for orders placed or performed during the hospital encounter of 06/01/14 (from the past 24 hour(s))  Troponin I     Status: Abnormal   Collection Time: 06/02/14 10:25 AM  Result Value Ref Range   Troponin I 0.08 (H) <0.031 ng/mL  Glucose, capillary     Status: None   Collection Time: 06/02/14  4:14 PM  Result Value Ref Range   Glucose-Capillary 99 70 - 99 mg/dL   Comment 1 Notify RN    Comment 2 Documented in Chart   Troponin I     Status: Abnormal   Collection Time: 06/02/14  5:15 PM  Result Value Ref Range   Troponin I 0.09 (H) <0.031 ng/mL  Troponin I     Status: Abnormal   Collection Time: 06/02/14  9:56 PM  Result Value Ref Range   Troponin I 0.05 (H) <0.031 ng/mL    Imaging/Diagnostic Tests: Dg Chest 2 View 06/01/2014  IMPRESSION: 1. Progressive left lower lobe airspace disease concerning for developing pneumonia.    Ct Head Wo Contrast 06/01/2014   IMPRESSION: Chronic involutional change with no acute findings.     Ct Angio Chest Pe W/cm &/or Wo Cm 06/01/2014  IMPRESSION: *Motion limits evaluation of lobar branches. Otherwise, no evidence of pulmonary embolism. *Inferior tip of right lower lobe excluded from the study. Bibasilar atelectasis. *Cholelithiasis    [place], DO 06/03/2014, 6:02 AM PGY-1, [place] Intern pager: [phone], text pages welcome

## 2014-06-03 NOTE — Discharge Summary (Signed)
[facility] Discharge Summary  Patient name: Patrick [name] Medical record number: [id number] Date of birth: 1951/01/22 Age: 64 y.o. Gender: male Date of Admission: 06/01/2014  Date of Discharge: 06/03/13 Admitting Physician: Willeen Niece, MD  Primary Care Provider: Andrena Mews, MD Consultants: Cardiology  Indication for Hospitalization: Syncope, Cough  Discharge Diagnoses/Problem List:  Syncope Cough CHF HTN Hypokalemia BPH HLD DM  Disposition: Discharge Home  Discharge Condition: Stable  Brief Hospital Course:  Patrick [name] is a 64yo male who presented to the ED on 1/3 with syncopal episode and complaint of cough. States he did not have anything to eat or drink on day of syncopal episode. Due to significant cardiac history of RBBB and first degree AV block, cardiac workup was initiated. Orthostatic vital signs were normal. Troponins trended up to 0.09, however improved to 0.05 prior to discharge.  TSH was 0.531. Blood pressure was low in 90s/60s at admission, so Coreg and other anti-hypertensive medications were held during hospitalization.  Cardiology was consulted. Suspects that syncope was secondary to dehydration and vasovagal reaction. Echo on 1/5 showed stage 1 diastolic dysfunction with elevated LV filling pressure with EF 60-65%.  Potassium was low during hospitalization. Repleted.  Complained of diarrhea during hospitalization. C. Difficile negative. Improved prior to discharge.  Patrick [name] continued to note cough throughout hospitalization. Had been seen as outpatient in weeks leading up to admission. Tessalon Perles and Hycodan was continued. Noted improvement in cough throughout hospitalization.  Issues for Follow Up:  - Lisinopril discontinued prior to hospitalization due to cough - Outpatient Nuclear Study - Lasix and Maxide discontinued. Carvedilol decreased to 6.25mg  BID. Check blood pressure and adjust medications as indicated. - Follow  up continued improvement of cough and diarrhea - BMP to monitor K. 3.2 at discharge repleted with KDur.  Significant Procedures: Echocardiogram  Significant Labs and Imaging:   Recent Labs Lab 06/01/14 1638 06/02/14 0445  WBC 9.9 10.1  HGB 13.7 12.5*  HCT 38.8* 35.6*  PLT 125* 135*    Recent Labs Lab 06/01/14 1638 06/02/14 0445 06/03/14 0528  NA 131* 133* 135  K 3.3* 3.3* 3.2*  CL 90* 93* 100  CO2 31 29 24   GLUCOSE 126* 96 96  BUN 13 16 16   CREATININE 1.31 1.38* 1.24  CALCIUM 9.3 8.9 8.5  ALKPHOS 67  --   --   AST 22  --   --   ALT 18  --   --   ALBUMIN 4.0  --   --   - Troponins 0.03>0.04>0.08>0.09>0.05>0.05 - C. Difficile negative - D dimer 2.19 - TSH 0.531  Results/Tests Pending at Time of Discharge: None  Discharge Medications:    Medication List    STOP taking these medications        aspirin 325 MG tablet  Replaced by:  aspirin 81 MG EC tablet     azithromycin 250 MG tablet  Commonly known as:  ZITHROMAX Z-PAK     furosemide 40 MG tablet  Commonly known as:  LASIX     triamterene-hydrochlorothiazide 75-50 MG per tablet  Commonly known as:  MAXZIDE      TAKE these medications        aspirin 81 MG EC tablet  Take 1 tablet (81 mg total) by mouth daily.     benzonatate 100 MG capsule  Commonly known as:  TESSALON  Take 1 capsule (100 mg total) by mouth 2 (two) times daily as needed for cough.  BIOFLAVONOIDS PO  Take 1 tablet by mouth 2 (two) times daily.     carvedilol 6.25 MG tablet  Commonly known as:  COREG  Take 1 tablet (6.25 mg total) by mouth 2 (two) times daily with a meal.     finasteride 5 MG tablet  Commonly known as:  PROSCAR  Take 5 mg by mouth daily.     fluticasone 50 MCG/ACT nasal spray  Commonly known as:  FLONASE  Place 2 sprays into both nostrils daily.     HYDROcodone-homatropine 5-1.5 MG/5ML syrup  Commonly known as:  HYCODAN  Take 5 mLs by mouth every 6 (six) hours as needed for cough.     ibuprofen 800  MG tablet  Commonly known as:  ADVIL,MOTRIN  Take 800 mg by mouth every 12 (twelve) hours as needed for headache or moderate pain.     lovastatin 20 MG tablet  Commonly known as:  MEVACOR  Take 1 tablet (20 mg total) by mouth at bedtime.     multivitamin with minerals tablet  Take 1 tablet by mouth daily.     omega-3 acid ethyl esters 1 G capsule  Commonly known as:  LOVAZA  Take 2 g by mouth 2 (two) times daily.     potassium chloride SA 20 MEQ tablet  Commonly known as:  K-DUR,KLOR-CON  TAKE 1 TABLET BY MOUTH TWICE A WEEK     senna 8.6 MG Tabs tablet  Commonly known as:  SENOKOT  Take 1 tablet (8.6 mg total) by mouth daily as needed for mild constipation.     tamsulosin 0.4 MG Caps capsule  Commonly known as:  FLOMAX  Take 0.4 mg by mouth daily.     traMADol 50 MG tablet  Commonly known as:  ULTRAM  TAKE 1 TABLET BY MOUTH EVERY 8 HOURS AS NEEDED     traZODone 150 MG tablet  Commonly known as:  DESYREL  TAKE 1 TABLET BY MOUTH EVERY NIGHT AT BEDTIME     vitamin C 1000 MG tablet  Take 2,000 mg by mouth 2 (two) times daily.        Discharge Instructions: Please refer to Patient Instructions section of EMR for full details.  Patient was counseled important signs and symptoms that should prompt return to medical care, changes in medications, dietary instructions, activity restrictions, and follow up appointments.   Follow-Up Appointments:     Follow-up Information    Follow up with Andrena Mews, MD On 06/06/2014.   Specialty:  Family Medicine   Why:  @10 :45am for Hospital Follow Up   Contact information:   [place]  [address] [phone]       Follow up with Tarri Fuller, PA-C On 06/24/2014.   Specialty:  Physician Assistant   Why:  @11am  for [facility] Follow Up   Contact information:   [address] [place] [address] [phone]       [place], [place] 06/03/2014, 7:59 PM PGY-1, [place]

## 2014-06-03 NOTE — Discharge Instructions (Signed)
Since your blood pressure has been low, we will not continue your Lasix or Maxide at discharge. We will lower your dose of Carvedilol to 6.25mg  twice a day. Please follow up with your family medicine doctor on Friday; she will check your blood pressure and labs to determine if any changes to your medications should be made. Please do not drive until your Cardiology appointment on 06-24-13.  Vasovagal Syncope, Adult Syncope, commonly known as fainting, is a temporary loss of consciousness. It occurs when the blood flow to the brain is reduced. Vasovagal syncope (also called neurocardiogenic syncope) is a fainting spell in which the blood flow to the brain is reduced because of a sudden drop in heart rate and blood pressure. Vasovagal syncope occurs when the brain and the cardiovascular system (blood vessels) do not adequately communicate and respond to each other. This is the most common cause of fainting. It often occurs in response to fear or some other type of emotional or physical stress. The body has a reaction in which the heart starts beating too slowly or the blood vessels expand, reducing blood pressure. This type of fainting spell is generally considered harmless. However, injuries can occur if a person takes a sudden fall during a fainting spell.  CAUSES  Vasovagal syncope occurs when a person's blood pressure and heart rate decrease suddenly, usually in response to a trigger. Many things and situations can trigger an episode. Some of these include:   Pain.   Fear.   The sight of blood or medical procedures, such as blood being drawn from a vein.   Common activities, such as coughing, swallowing, stretching, or going to the bathroom.   Emotional stress.   Prolonged standing, especially in a warm environment.   Lack of sleep or rest.   Prolonged lack of food.   Prolonged lack of fluids.   Recent illness.  The use of certain drugs that affect blood pressure, such as  cocaine, alcohol, marijuana, inhalants, and opiates.  SYMPTOMS  Before the fainting episode, you may:   Feel dizzy or light headed.   Become pale.  Sense that you are going to faint.   Feel like the room is spinning.   Have tunnel vision, only seeing directly in front of you.   Feel sick to your stomach (nauseous).   See spots or slowly lose vision.   Hear ringing in your ears.   Have a headache.   Feel warm and sweaty.   Feel a sensation of pins and needles. During the fainting spell, you will generally be unconscious for no longer than a couple minutes before waking up and returning to normal. If you get up too quickly before your body can recover, you may faint again. Some twitching or jerky movements may occur during the fainting spell.  DIAGNOSIS  Your caregiver will ask about your symptoms, take a medical history, and perform a physical exam. Various tests may be done to rule out other causes of fainting. These may include blood tests and tests to check the heart, such as electrocardiography, echocardiography, and possibly an electrophysiology study. When other causes have been ruled out, a test may be done to check the body's response to changes in position (tilt table test). TREATMENT  Most cases of vasovagal syncope do not require treatment. Your caregiver may recommend ways to avoid fainting triggers and may provide home strategies for preventing fainting. If you must be exposed to a possible trigger, you can drink additional fluids to help  reduce your chances of having an episode of vasovagal syncope. If you have warning signs of an oncoming episode, you can respond by positioning yourself favorably (lying down). If your fainting spells continue, you may be given medicines to prevent fainting. Some medicines may help make you more resistant to repeated episodes of vasovagal syncope. Special exercises or compression stockings may be recommended. In rare cases, the  surgical placement of a pacemaker is considered. HOME CARE INSTRUCTIONS   Learn to identify the warning signs of vasovagal syncope.   Sit or lie down at the first warning sign of a fainting spell. If sitting, put your head down between your legs. If you lie down, swing your legs up in the air to increase blood flow to the brain.   Avoid hot tubs and saunas.  Avoid prolonged standing.  Drink enough fluids to keep your urine clear or pale yellow. Avoid caffeine.  Increase salt in your diet as directed by your caregiver.   If you have to stand for a long time, perform movements such as:   Crossing your legs.   Flexing and stretching your leg muscles.   Squatting.   Moving your legs.   Bending over.   Only take over-the-counter or prescription medicines as directed by your caregiver. Do not suddenly stop any medicines without asking your caregiver first. SEEK MEDICAL CARE IF:   Your fainting spells continue or happen more frequently in spite of treatment.   You lose consciousness for more than a couple minutes.  You have fainting spells during or after exercising or after being startled.   You have new symptoms that occur with the fainting spells, such as:   Shortness of breath.  Chest pain.   Irregular heartbeat.   You have episodes of twitching or jerky movements that last longer than a few seconds.  You have episodes of twitching or jerky movements without obvious fainting. SEEK IMMEDIATE MEDICAL CARE IF:   You have injuries or bleeding after a fainting spell.   You have episodes of twitching or jerky movements that last longer than 5 minutes.   You have more than one spell of twitching or jerky movements before returning to consciousness after fainting. MAKE SURE YOU:   Understand these instructions.  Will watch your condition.  Will get help right away if you are not doing well or get worse. Document Released: 05/02/2012 Document  Reviewed: 05/02/2012 [facility] Patient Information 2015 [employer]. This information is not intended to replace advice given to you by your health care provider. Make sure you discuss any questions you have with your health care provider.

## 2014-06-03 NOTE — Progress Notes (Signed)
[facility] PCP's NOTE Marcea Rojek,MD I stopped by to visit Patrick [name] this morning in the hospital. He denies any concern, no  dizziness or chest pain. We discussed his low BP and antihypertensive regimen he is on. He had done well on this in the past, but he attributed his low BP to not been drinking or eating well and loss weight. He also indicated that taking care of his sister made him lose weight. As discussed with him it might be reasonable to hold back on some of his antihypertensive medications and when I see him back in the clinic we can restart some of them if necessary. He agreed with this.  Consider holding Maxzide upon d/c home. He might benefit from ACE and lasix if his BP can tolerate them due to his cardiac hx. I appreciate the care provided by the in-patient team.

## 2014-06-03 NOTE — Progress Notes (Signed)
UR completed 

## 2014-06-03 NOTE — Progress Notes (Signed)
Echocardiogram 2D Echocardiogram has been performed.  Patrick [name] 06/03/2014, 10:11 AM

## 2014-06-04 ENCOUNTER — Other Ambulatory Visit: Payer: Self-pay | Admitting: Family Medicine

## 2014-06-06 ENCOUNTER — Telehealth: Payer: Self-pay | Admitting: Family Medicine

## 2014-06-06 ENCOUNTER — Ambulatory Visit (INDEPENDENT_AMBULATORY_CARE_PROVIDER_SITE_OTHER): Payer: Commercial Managed Care - HMO | Admitting: Family Medicine

## 2014-06-06 ENCOUNTER — Encounter: Payer: Self-pay | Admitting: Family Medicine

## 2014-06-06 VITALS — BP 147/80 | HR 55 | Temp 98.1°F | Ht 69.0 in | Wt 215.0 lb

## 2014-06-06 DIAGNOSIS — I10 Essential (primary) hypertension: Secondary | ICD-10-CM

## 2014-06-06 DIAGNOSIS — F329 Major depressive disorder, single episode, unspecified: Secondary | ICD-10-CM

## 2014-06-06 DIAGNOSIS — E119 Type 2 diabetes mellitus without complications: Secondary | ICD-10-CM

## 2014-06-06 DIAGNOSIS — I509 Heart failure, unspecified: Secondary | ICD-10-CM

## 2014-06-06 DIAGNOSIS — F320 Major depressive disorder, single episode, mild: Secondary | ICD-10-CM | POA: Insufficient documentation

## 2014-06-06 DIAGNOSIS — F32A Depression, unspecified: Secondary | ICD-10-CM

## 2014-06-06 NOTE — Telephone Encounter (Signed)
Pt called because he forgot to ask Dr. Gwendlyn Deutscher a question. Since he is no longer taking a diuretic how much water should he consume a day?

## 2014-06-06 NOTE — Assessment & Plan Note (Addendum)
Stable on diet control. Referral to podiatrist done for foot exam and grooming.

## 2014-06-06 NOTE — Assessment & Plan Note (Addendum)
Euvolemic. Doing well since hospital discharge. Referral to cardiologist done. F/U as instructed and continue Coreg as instructed.

## 2014-06-06 NOTE — Patient Instructions (Signed)

## 2014-06-06 NOTE — Progress Notes (Signed)
Subjective:     Patient ID: Patrick [name], male   DOB: Oct 01, 1950, 64 y.o.   MRN: [id number]  HPI  CHF: Denies any cough or chest pain, no leg swelling. He is compliant with his Coreg. Lasix was d/c due to recent low BP. Denies any new concern today. Has cardiology follow up with Tarri Fuller. He requested referral for this so that his insurance will pay for the visit. DM2: Doing well with diet control. Need new referral to podiatry for foot exam and grooming. HTN: Was recently d/c from the hospital for syncope secondary to hypotension. He was previously on Maxzide,Lasix, Lisinopril and Coreg. He noticed his BP started dropping low about 4 months ago when he was started on Flomax. He was d/c home only at a lower dose of Coreg 6.25mg  BID which he has been doing well on. Depression: Hx of depression in the past, he is doing well without medication, denies any major concern, not suicidal. Current Outpatient Prescriptions on File Prior to Visit  Medication Sig Dispense Refill  . Ascorbic Acid (VITAMIN C) 1000 MG tablet Take 2,000 mg by mouth 2 (two) times daily.    Marland Kitchen aspirin EC 81 MG EC tablet Take 1 tablet (81 mg total) by mouth daily. 30 tablet 0  . BIOFLAVONOIDS PO Take 1 tablet by mouth 2 (two) times daily.    . carvedilol (COREG) 6.25 MG tablet Take 1 tablet (6.25 mg total) by mouth 2 (two) times daily with a meal. 60 tablet 0  . finasteride (PROSCAR) 5 MG tablet Take 5 mg by mouth daily.    . fluticasone (FLONASE) 50 MCG/ACT nasal spray Place 2 sprays into both nostrils daily. 16 g 6  . ibuprofen (ADVIL,MOTRIN) 800 MG tablet Take 800 mg by mouth every 12 (twelve) hours as needed for headache or moderate pain.    Marland Kitchen lovastatin (MEVACOR) 20 MG tablet Take 1 tablet (20 mg total) by mouth at bedtime. 90 tablet 1  . Multiple Vitamins-Minerals (MULTIVITAMIN WITH MINERALS) tablet Take 1 tablet by mouth daily.    Marland Kitchen omega-3 acid ethyl esters (LOVAZA) 1 G capsule Take 2 g by mouth 2 (two) times daily.    .  potassium chloride SA (K-DUR,KLOR-CON) 20 MEQ tablet TAKE 1 TABLET BY MOUTH TWICE A WEEK 25 tablet 1  . tamsulosin (FLOMAX) 0.4 MG CAPS capsule Take 0.4 mg by mouth daily.     . traMADol (ULTRAM) 50 MG tablet TAKE 1 TABLET BY MOUTH EVERY 8 HOURS AS NEEDED 90 tablet 2  . traZODone (DESYREL) 150 MG tablet TAKE 1 TABLET BY MOUTH EVERY NIGHT AT BEDTIME 90 tablet 1  . senna (SENOKOT) 8.6 MG TABS tablet Take 1 tablet (8.6 mg total) by mouth daily as needed for mild constipation. (Patient not taking: Reported on 06/06/2014) 30 each 0   No current facility-administered medications on file prior to visit.   Past Medical History  Diagnosis Date  . CHF (congestive heart failure)   . Hypertension   . DM II (diabetes mellitus, type II), controlled     Diet controlled.   . Hyperlipidemia   . Degenerative joint disease of knee, right   . Enlarged prostate       Review of Systems  Respiratory: Negative.   Cardiovascular: Negative.   Gastrointestinal: Negative.   Musculoskeletal: Negative.   Neurological: Negative for syncope.  All other systems reviewed and are negative.  Filed Vitals:   06/06/14 1056  BP: 147/80  Pulse: 55  Temp: 98.1 F (  36.7 C)  TempSrc: Oral  Height: 5\' 9"  (1.753 m)  Weight: 215 lb (97.523 kg)       Objective:   Physical Exam  Constitutional: He is oriented to person, place, and time. He appears well-developed. No distress.  Cardiovascular: Normal rate, regular rhythm, normal heart sounds and intact distal pulses.   No murmur heard. Pulmonary/Chest: Effort normal and breath sounds normal. No respiratory distress. He has no wheezes. He exhibits no tenderness.  Abdominal: Soft. Bowel sounds are normal. He exhibits no distension and no mass. There is no tenderness.  Musculoskeletal: Normal range of motion. He exhibits no edema.  Neurological: He is alert and oriented to person, place, and time.  Psychiatric: He has a normal mood and affect. His behavior is normal.  Judgment and thought content normal. He expresses no homicidal and no suicidal ideation.  Nursing note and vitals reviewed.      Assessment:     CHF DM2 HTN Depression     Plan:     Check problem list.

## 2014-06-06 NOTE — Telephone Encounter (Signed)
Pt is aware.  Jazmin Hartsell,CMA  

## 2014-06-06 NOTE — Telephone Encounter (Signed)
Will forward to Md to advise. Jazmin Hartsell,CMA  

## 2014-06-06 NOTE — Assessment & Plan Note (Signed)
BP stable on current regimen at 140/80. BP dropped likely due to addition on Flomax 4 months ago. Continue to d/c Maxzide, Lasix and Lisinopril for now. May consider restarting low dose Lisinopril in the future due to hx of DM and CHF.

## 2014-06-06 NOTE — Telephone Encounter (Signed)
No more than 1 liter a day.

## 2014-06-06 NOTE — Assessment & Plan Note (Signed)
Hx of depression in the past, was on medication. Now only on Trazodone qhs for insomnia. PHQ 9 of 2 Doing well in general. Counseling done today. No additional treatment needed at this time.

## 2014-06-08 LAB — CULTURE, BLOOD (ROUTINE X 2)
Culture: NO GROWTH
Culture: NO GROWTH

## 2014-06-12 ENCOUNTER — Telehealth: Payer: Self-pay | Admitting: Family Medicine

## 2014-06-12 NOTE — Telephone Encounter (Signed)
Pt called because his ankles and feet are swollen just about everyday. He is also very winded now. He is wondering if he should be put back some of his other medications. jw

## 2014-06-12 NOTE — Telephone Encounter (Signed)
Need to see him back for BP check prior to restarting any medication. If having difficulty breathing please advise him to go straight to the hospital.

## 2014-06-12 NOTE — Telephone Encounter (Signed)
No it will take another patient's time. Please have him scNo it will take another patient's time. Please have him schedule a separate time.hedule a separate time.

## 2014-06-12 NOTE — Telephone Encounter (Signed)
No it will take another patient's time. Please have him schedule a separate time.

## 2014-06-12 NOTE — Telephone Encounter (Signed)
Have him see someone else soon next week.

## 2014-06-12 NOTE — Telephone Encounter (Signed)
Spoke with patient and states that his SOB is occasional.  He more concerned about the swelling in his feet.  Informed him that the next available with PCP would be 06/20/14.  He would like to know if you would be willing to see him at 1130 on 1/ 19/16 with Leilani Merl?  Jazmin Hartsell,CMA

## 2014-06-12 NOTE — Telephone Encounter (Signed)
11:30 is the last appointment time for you of the morning.  Cherise Fedder,CMA

## 2014-06-12 NOTE — Telephone Encounter (Signed)
LM with sister for patient to call back and schedule that morning with another provider.  Patrick [name],CMA

## 2014-06-13 ENCOUNTER — Ambulatory Visit (INDEPENDENT_AMBULATORY_CARE_PROVIDER_SITE_OTHER): Payer: Commercial Managed Care - HMO | Admitting: Family Medicine

## 2014-06-13 ENCOUNTER — Encounter: Payer: Self-pay | Admitting: Family Medicine

## 2014-06-13 VITALS — BP 140/96 | HR 80 | Temp 98.6°F | Ht 69.0 in | Wt 223.1 lb

## 2014-06-13 DIAGNOSIS — R0600 Dyspnea, unspecified: Secondary | ICD-10-CM

## 2014-06-13 DIAGNOSIS — I509 Heart failure, unspecified: Secondary | ICD-10-CM

## 2014-06-13 MED ORDER — FUROSEMIDE 40 MG PO TABS: 40.0000 mg | ORAL_TABLET | Freq: Every day | ORAL | Status: DC

## 2014-06-13 MED ORDER — LEVOFLOXACIN 500 MG PO TABS: 500.0000 mg | ORAL_TABLET | Freq: Every day | ORAL | Status: DC

## 2014-06-13 NOTE — Progress Notes (Signed)
Patient ID: ANTWAIN CALIENDO, male   DOB: 02/06/1951, 64 y.o.   MRN: [id number]   HPI  Patient presents today for same-day appointment for dyspnea  Patient explains that over the Christmas holiday he was seen in the ER and found to have a low blood pressure he was then admitted to the hospital for syncope. The syncope was felt to be orthostatic versus vasovagal. On discharge his Lasix and Maxzide were discontinued., Before that lisinopril was stopped due to cough.  Today he presents with a three-day history of worsening exertional dyspnea. He states that it's worse whenever he lays down flat but that he can't sleep on his back at baseline. He also states that he has generalized malaise, chills, and poor appetite over the last 2-3 days. He has one sick contact, his sister, (recently diagnosed with rhonchi this and treated with azithromycin.  He denies chest pain, palpitations. He has had increase in his leg edema over the last few days. He states that he's been on Lasix for several years and known that he's had heart failure for 10 years or more.  Smoking status noted ROS: Per HPI  Objective: BP 140/96 mmHg  Pulse 80  Temp(Src) 98.6 F (37 C) (Oral)  Ht 5\' 9"  (1.753 m)  Wt 223 lb 1.6 oz (101.197 kg)  BMI 32.93 kg/m2 Gen: NAD, alert, cooperative with exam HEENT: NCAT CV: RRR, good S1/S2, no murmur Resp: Nonlabored, crackles in bilateral bases left greater than right Ext: 2+ doughy edema bilateral lower extremities Neuro: Alert and oriented, No gross deficits  Assessment and plan:  Dyspnea Multifactorial dyspnea, concern for pneumonia and volume overload Restart Lasix 40 mg daily Levaquin 500 mg daily 7 days Able to get chest x-ray with follow-up due to the time the day on Friday afternoon, clinically I would consider this pneumonia Creatinine was 1.38 in the hospital 10 days ago and since all of his nephrotoxic agents have been stopped so I Vineet't believe this is due to renal  etiology. Follow-up next week with PCP or myself   Congestive heart failure Appears volume overloaded today Previous echo 10 days ago with normal EF and grade 1 diastolic dysfunction, however this time he was in a volume depleted state Previous echoes in 2009 and 2006 with EF 40 and 45% Would consider this as systolic heart failure with compensation on his last echo given his volume overload today.  Restart Lasix, will restart his old dose to ensure diuresis and hope to down-titrate to lowest possible dose for diuresis Also would be good to restart ACE inhibitor, would consider lower dose given his previous hypotension.  Continue coreg     Meds ordered this encounter  Medications  . levofloxacin (LEVAQUIN) 500 MG tablet    Sig: Take 1 tablet (500 mg total) by mouth daily.    Dispense:  7 tablet    Refill:  0  . furosemide (LASIX) 40 MG tablet    Sig: Take 1 tablet (40 mg total) by mouth daily.    Dispense:  30 tablet    Refill:  3

## 2014-06-13 NOTE — Assessment & Plan Note (Addendum)
Multifactorial dyspnea, concern for pneumonia and volume overload Restart Lasix 40 mg daily Levaquin 500 mg daily 7 days Able to get chest x-ray with follow-up due to the time the day on Friday afternoon, clinically I would consider this pneumonia Creatinine was 1.38 in the hospital 10 days ago and since all of his nephrotoxic agents have been stopped so I Richrd't believe this is due to renal etiology. Follow-up next week with PCP or myself

## 2014-06-13 NOTE — Assessment & Plan Note (Signed)
Appears volume overloaded today Previous echo 10 days ago with normal EF and grade 1 diastolic dysfunction, however this time he was in a volume depleted state Previous echoes in 2009 and 2006 with EF 40 and 45% Would consider this as systolic heart failure with compensation on his last echo given his volume overload today.  Restart Lasix, will restart his old dose to ensure diuresis and hope to down-titrate to lowest possible dose for diuresis Also would be good to restart ACE inhibitor, would consider lower dose given his previous hypotension.  Continue coreg

## 2014-06-13 NOTE — Patient Instructions (Signed)
Great to meet you today!  I think you have 2 reasons to have shortness of breath, too much fluid and pneumonia.   Take levaquin, An antibiotic for the pneumonia Take lasix for your elevated fluid (likely due to heart failure).   Please come back to see Dr. Gwendlyn Deutscher or myself next week.

## 2014-06-17 ENCOUNTER — Ambulatory Visit: Payer: Medicaid Other | Admitting: Family Medicine

## 2014-06-18 ENCOUNTER — Inpatient Hospital Stay (HOSPITAL_COMMUNITY)
Admission: AD | Admit: 2014-06-18 | Discharge: 2014-06-22 | DRG: 293 | Disposition: A | Discharge status: Home / Self Care | Payer: Commercial Managed Care - HMO | Source: Ambulatory Visit | Attending: Family Medicine | Admitting: Family Medicine

## 2014-06-18 ENCOUNTER — Encounter: Payer: Self-pay | Admitting: Family Medicine

## 2014-06-18 ENCOUNTER — Ambulatory Visit (INDEPENDENT_AMBULATORY_CARE_PROVIDER_SITE_OTHER): Payer: Medicaid Other | Admitting: Family Medicine

## 2014-06-18 ENCOUNTER — Observation Stay (HOSPITAL_COMMUNITY): Payer: Commercial Managed Care - HMO

## 2014-06-18 ENCOUNTER — Ambulatory Visit (HOSPITAL_COMMUNITY)
Admission: AD | Admit: 2014-06-18 | Discharge: 2014-06-18 | Disposition: A | Discharge status: Home / Self Care | Payer: Commercial Managed Care - HMO | Source: Ambulatory Visit | Admitting: Family Medicine

## 2014-06-18 VITALS — BP 190/113 | HR 91 | Temp 97.9°F | Wt 218.0 lb

## 2014-06-18 DIAGNOSIS — N400 Enlarged prostate without lower urinary tract symptoms: Secondary | ICD-10-CM | POA: Diagnosis present

## 2014-06-18 DIAGNOSIS — Z791 Long term (current) use of non-steroidal anti-inflammatories (NSAID): Secondary | ICD-10-CM

## 2014-06-18 DIAGNOSIS — Z8249 Family history of ischemic heart disease and other diseases of the circulatory system: Secondary | ICD-10-CM | POA: Diagnosis not present

## 2014-06-18 DIAGNOSIS — I10 Essential (primary) hypertension: Secondary | ICD-10-CM | POA: Diagnosis present

## 2014-06-18 DIAGNOSIS — E039 Hypothyroidism, unspecified: Secondary | ICD-10-CM | POA: Diagnosis present

## 2014-06-18 DIAGNOSIS — Z7982 Long term (current) use of aspirin: Secondary | ICD-10-CM

## 2014-06-18 DIAGNOSIS — M1711 Unilateral primary osteoarthritis, right knee: Secondary | ICD-10-CM | POA: Diagnosis present

## 2014-06-18 DIAGNOSIS — I5033 Acute on chronic diastolic (congestive) heart failure: Principal | ICD-10-CM | POA: Diagnosis present

## 2014-06-18 DIAGNOSIS — I509 Heart failure, unspecified: Secondary | ICD-10-CM

## 2014-06-18 DIAGNOSIS — Z8042 Family history of malignant neoplasm of prostate: Secondary | ICD-10-CM

## 2014-06-18 DIAGNOSIS — R0602 Shortness of breath: Secondary | ICD-10-CM | POA: Diagnosis present

## 2014-06-18 DIAGNOSIS — R0600 Dyspnea, unspecified: Secondary | ICD-10-CM

## 2014-06-18 DIAGNOSIS — D508 Other iron deficiency anemias: Secondary | ICD-10-CM | POA: Insufficient documentation

## 2014-06-18 DIAGNOSIS — E785 Hyperlipidemia, unspecified: Secondary | ICD-10-CM | POA: Diagnosis present

## 2014-06-18 DIAGNOSIS — H3530 Unspecified macular degeneration: Secondary | ICD-10-CM | POA: Diagnosis present

## 2014-06-18 DIAGNOSIS — Z825 Family history of asthma and other chronic lower respiratory diseases: Secondary | ICD-10-CM

## 2014-06-18 DIAGNOSIS — T502X5A Adverse effect of carbonic-anhydrase inhibitors, benzothiadiazides and other diuretics, initial encounter: Secondary | ICD-10-CM | POA: Diagnosis present

## 2014-06-18 DIAGNOSIS — Z833 Family history of diabetes mellitus: Secondary | ICD-10-CM | POA: Diagnosis not present

## 2014-06-18 DIAGNOSIS — Z823 Family history of stroke: Secondary | ICD-10-CM

## 2014-06-18 DIAGNOSIS — E876 Hypokalemia: Secondary | ICD-10-CM | POA: Diagnosis present

## 2014-06-18 DIAGNOSIS — Z87891 Personal history of nicotine dependence: Secondary | ICD-10-CM | POA: Diagnosis not present

## 2014-06-18 DIAGNOSIS — Z792 Long term (current) use of antibiotics: Secondary | ICD-10-CM

## 2014-06-18 DIAGNOSIS — D649 Anemia, unspecified: Secondary | ICD-10-CM | POA: Diagnosis present

## 2014-06-18 DIAGNOSIS — E119 Type 2 diabetes mellitus without complications: Secondary | ICD-10-CM | POA: Diagnosis present

## 2014-06-18 DIAGNOSIS — I499 Cardiac arrhythmia, unspecified: Secondary | ICD-10-CM

## 2014-06-18 HISTORY — DX: Pneumonia, unspecified organism: J18.9

## 2014-06-18 HISTORY — DX: Unspecified osteoarthritis, unspecified site: M19.90

## 2014-06-18 LAB — BASIC METABOLIC PANEL
Anion gap: 12 (ref 5–15)
BUN: 10 mg/dL (ref 6–23)
CHLORIDE: 106 meq/L (ref 96–112)
CO2: 23 mmol/L (ref 19–32)
Calcium: 8.8 mg/dL (ref 8.4–10.5)
Creatinine, Ser: 0.99 mg/dL (ref 0.50–1.35)
GFR calc Af Amer: >90 mL/min (ref >90)
GFR, EST NON AFRICAN AMERICAN: 83 mL/min — AB (ref >90)
GLUCOSE: 85 mg/dL (ref 70–99)
POTASSIUM: 4.2 mmol/L (ref 3.5–5.1)
SODIUM: 141 mmol/L (ref 135–145)

## 2014-06-18 LAB — CBC
HCT: 37 % — ABNORMAL LOW (ref 39.0–52.0)
Hemoglobin: 12.1 g/dL — ABNORMAL LOW (ref 13.0–17.0)
MCH: 28.4 pg (ref 26.0–34.0)
MCHC: 32.7 g/dL (ref 30.0–36.0)
MCV: 86.9 fL (ref 78.0–100.0)
PLATELETS: 155 10*3/uL (ref 150–400)
RBC: 4.26 MIL/uL (ref 4.22–5.81)
RDW: 14.5 % (ref 11.5–15.5)
WBC: 6 10*3/uL (ref 4.0–10.5)

## 2014-06-18 LAB — TROPONIN I: TROPONIN I: 0.04 ng/mL — AB (ref <0.031)

## 2014-06-18 LAB — BRAIN NATRIURETIC PEPTIDE: B Natriuretic Peptide: 938 pg/mL — ABNORMAL HIGH (ref 0.0–100.0)

## 2014-06-18 MED ORDER — CARVEDILOL 6.25 MG PO TABS
6.2500 mg | ORAL_TABLET | Freq: Two times a day (BID) | ORAL | Status: DC
Start: 2014-06-19 — End: 2014-06-19
  Filled 2014-06-18: qty 1

## 2014-06-18 MED ORDER — SENNA 8.6 MG PO TABS: 1.0000 | ORAL_TABLET | Freq: Every day | ORAL | Status: DC | PRN

## 2014-06-18 MED ORDER — TAMSULOSIN HCL 0.4 MG PO CAPS
0.4000 mg | ORAL_CAPSULE | Freq: Every day | ORAL | Status: DC
  Administered 2014-06-18 – 2014-06-22 (×5): 0.4 mg via ORAL
  Filled 2014-06-18 (×5): qty 1

## 2014-06-18 MED ORDER — SODIUM CHLORIDE 0.9 % IJ SOLN
3.0000 mL | Freq: Two times a day (BID) | INTRAMUSCULAR | Status: DC
  Administered 2014-06-19 – 2014-06-20 (×5): 3 mL via INTRAVENOUS

## 2014-06-18 MED ORDER — SODIUM CHLORIDE 0.9 % IV SOLN: 250.0000 mL | INTRAVENOUS | Status: DC | PRN

## 2014-06-18 MED ORDER — TRAZODONE HCL 150 MG PO TABS
150.0000 mg | ORAL_TABLET | Freq: Every day | ORAL | Status: DC
  Administered 2014-06-19 – 2014-06-21 (×4): 150 mg via ORAL
  Filled 2014-06-18 (×5): qty 1

## 2014-06-18 MED ORDER — ASPIRIN EC 81 MG PO TBEC
81.0000 mg | DELAYED_RELEASE_TABLET | Freq: Every day | ORAL | Status: DC
  Administered 2014-06-19 – 2014-06-22 (×4): 81 mg via ORAL
  Filled 2014-06-18 (×4): qty 1

## 2014-06-18 MED ORDER — FINASTERIDE 5 MG PO TABS
5.0000 mg | ORAL_TABLET | Freq: Every day | ORAL | Status: DC
  Administered 2014-06-19 – 2014-06-22 (×4): 5 mg via ORAL
  Filled 2014-06-18 (×4): qty 1

## 2014-06-18 MED ORDER — HEPARIN SODIUM (PORCINE) 5000 UNIT/ML IJ SOLN
5000.0000 [IU] | Freq: Three times a day (TID) | INTRAMUSCULAR | Status: DC
  Administered 2014-06-19 – 2014-06-22 (×11): 5000 [IU] via SUBCUTANEOUS
  Filled 2014-06-18 (×15): qty 1

## 2014-06-18 MED ORDER — LISINOPRIL 2.5 MG PO TABS
2.5000 mg | ORAL_TABLET | Freq: Every day | ORAL | Status: DC
  Filled 2014-06-18: qty 1

## 2014-06-18 MED ORDER — PRAVASTATIN SODIUM 20 MG PO TABS
20.0000 mg | ORAL_TABLET | Freq: Every day | ORAL | Status: DC
  Administered 2014-06-19 – 2014-06-21 (×3): 20 mg via ORAL
  Filled 2014-06-18 (×4): qty 1

## 2014-06-18 MED ORDER — LOSARTAN POTASSIUM 25 MG PO TABS
25.0000 mg | ORAL_TABLET | Freq: Every day | ORAL | Status: DC
  Administered 2014-06-19 (×2): 25 mg via ORAL
  Filled 2014-06-18 (×4): qty 1

## 2014-06-18 MED ORDER — FUROSEMIDE 10 MG/ML IJ SOLN
40.0000 mg | Freq: Two times a day (BID) | INTRAMUSCULAR | Status: DC
  Administered 2014-06-18: 40 mg via INTRAVENOUS
  Filled 2014-06-18 (×3): qty 4

## 2014-06-18 MED ORDER — SODIUM CHLORIDE 0.9 % IJ SOLN: 3.0000 mL | INTRAMUSCULAR | Status: DC | PRN

## 2014-06-18 NOTE — H&P (Signed)
[facility] Admission History and Physical Service Pager: [phone]  Patient name: Patrick [name] Medical record number: [id number] Date of birth: [birth year] Age: 64 y.o. Gender: male  Primary Care Provider: Andrena Mews, MD Consultants: None Code Status: Full  Chief Complaint: CHF exacerbation  Assessment and Plan: Patrick [name] is a 64 y.o. male presenting with acute CHF exacerbation. PMH is significant for CHF, HTN, BPH, T2DM, and HLD.   #acute on chronic diastolic CHF exacerbation: Echo 05/2014 revealed an normal EF but grade 1 diastolic HF. Volume overloaded on exam. Unsure of the cause of his exacerbation. Doesn't appear to be related to infection. Lasix was stopped at discharge from prior admission due to syncope.    - Admit to telemetry under Dr. Lindell Noe - IV Lasix 40mg  q12hrs - Continue daily ASA 81mg  - Continue Coreg - Repeat EKG in AM - troponin x 1 - Labs in AM: BMET and CBC - supply oxygen as needed to keep saturations above 90% - currently on 2L [place] - strict I/Os - Daily weights - Will avoid giving IVFs  #HTN: BPs elevated.  - Continue home Coreg - Unsure why patient is not on ACE/ARB. It appears at his last admission he had syncopal episode and low BPs. Was previously on Lisinopril but had cough - Will add Losartan - Continue to monitor BPs   #BPH: Stable. Followed by [facility].  - continue finasteride and Flomax  # HLD: Stable - continue Pravastatin 20mg  daily   #T2DM: Diet controlled. Last A1c 5.8 (02/2014). - Continue to monitor glucose with daily CBG - If elevated, consider adding SSI.  FEN/GI: HH/carb-modified diet/ SLIV Prophylaxis: heparin SQ  Disposition: Admit to telemtry.  History of Present Illness: Patrick [name] is a 64 y.o. male presenting with acute CHF exacerbation. PMH is significant for CHF, HTN, BPH, T2DM, and HLD. Patient states that Thursday he came to clinic because he was feeling unwell. He was noted to  have a PNA and was started on Levaquin. Also during this visit he was noted to be a little fluid overloaded and so furosemide was started. Patient states that he felt no better and returned to clinic today for worsening symptoms. Patient was noticed to be short of breath and had bilateral edema of lower legs. Patient also endorses general malaise. He denies chest pain and nausea/vomiting. Patient has no oxygen requirements at home. Says he is unable to sleep on his back which is new. Now has to sleep with 2 pillows and on his left side.   Review Of Systems: Per HPI. Otherwise 12 point review of systems was performed and was unremarkable.  Patient Active Problem List   Diagnosis Date Noted  . Dyspnea 06/13/2014  . Mild depression 06/06/2014  . Elevated troponin I level   . Syncope 06/01/2014  . Syncope and collapse   . Type 2 diabetes mellitus without complication   . Healthcare maintenance 03/11/2014  . Obesity, unspecified 10/03/2013  . Hyperlipidemia 08/13/2013  . KNEE PAIN, RIGHT 01/26/2010  . CARDIAC MURMUR 01/26/2010  . MACULAR DEGENERATION 05/02/2008  . INSOMNIA 10/02/2007  . HYPOTHYROIDISM 08/21/2007  . SMALL BOWEL OBSTRUCTION, HX OF 08/21/2007  . ERECTILE DYSFUNCTION 03/13/2007  . DEPRESSION 02/06/2007  . Diet-controlled type 2 diabetes mellitus 01/16/2007  . Essential hypertension 01/16/2007  . Congestive heart failure 01/16/2007  . CHOLELITHIASIS 01/16/2007  . BENIGN PROSTATIC HYPERTROPHY, HX OF 01/16/2007   Past Medical History: Past Medical History  Diagnosis Date  . CHF (  congestive heart failure)   . Hypertension   . DM II (diabetes mellitus, type II), controlled     Diet controlled.   . Hyperlipidemia   . Degenerative joint disease of knee, right   . Enlarged prostate    Past Surgical History: Past Surgical History  Procedure Laterality Date  . Hernia repair     Social History: History  Substance Use Topics  . Smoking status: Former Smoker    Types:  Pipe, Landscape architect  . Smokeless tobacco: Not on file  . Alcohol Use: No   Please also refer to relevant sections of EMR.  Family History: Family History  Problem Relation Age of Onset  . Hypertension Mother   . Diabetes Mother   . Stroke Mother   . Cancer Father 12    Prostate  . Dementia Father   . COPD Sister   . Arthritis Sister   . Edema Sister    Allergies and Medications: No Known Allergies No current facility-administered medications on file prior to encounter.   Current Outpatient Prescriptions on File Prior to Encounter  Medication Sig Dispense Refill  . Ascorbic Acid (VITAMIN C) 1000 MG tablet Take 2,000 mg by mouth 2 (two) times daily.    Marland Kitchen aspirin EC 81 MG EC tablet Take 1 tablet (81 mg total) by mouth daily. 30 tablet 0  . BIOFLAVONOIDS PO Take 1 tablet by mouth 2 (two) times daily.    . carvedilol (COREG) 6.25 MG tablet Take 1 tablet (6.25 mg total) by mouth 2 (two) times daily with a meal. 60 tablet 0  . finasteride (PROSCAR) 5 MG tablet Take 5 mg by mouth daily.    . fluticasone (FLONASE) 50 MCG/ACT nasal spray Place 2 sprays into both nostrils daily. 16 g 6  . furosemide (LASIX) 40 MG tablet Take 1 tablet (40 mg total) by mouth daily. 30 tablet 3  . ibuprofen (ADVIL,MOTRIN) 800 MG tablet Take 800 mg by mouth every 12 (twelve) hours as needed for headache or moderate pain.    Marland Kitchen levofloxacin (LEVAQUIN) 500 MG tablet Take 1 tablet (500 mg total) by mouth daily. 7 tablet 0  . lovastatin (MEVACOR) 20 MG tablet Take 1 tablet (20 mg total) by mouth at bedtime. 90 tablet 1  . Multiple Vitamins-Minerals (MULTIVITAMIN WITH MINERALS) tablet Take 1 tablet by mouth daily.    Marland Kitchen omega-3 acid ethyl esters (LOVAZA) 1 G capsule Take 2 g by mouth 2 (two) times daily.    . potassium chloride SA (K-DUR,KLOR-CON) 20 MEQ tablet TAKE 1 TABLET BY MOUTH TWICE A WEEK 25 tablet 1  . senna (SENOKOT) 8.6 MG TABS tablet Take 1 tablet (8.6 mg total) by mouth daily as needed for mild constipation.  (Patient not taking: Reported on 06/06/2014) 30 each 0  . tamsulosin (FLOMAX) 0.4 MG CAPS capsule Take 0.4 mg by mouth daily.     . traMADol (ULTRAM) 50 MG tablet TAKE 1 TABLET BY MOUTH EVERY 8 HOURS AS NEEDED 90 tablet 2  . traZODone (DESYREL) 150 MG tablet TAKE 1 TABLET BY MOUTH EVERY NIGHT AT BEDTIME 90 tablet 1    Objective: BP 159/81 mmHg  Pulse 65  Temp(Src) 98.4 F (36.9 C) (Oral)  Resp 18  Ht 5\' 9"  (1.753 m)  Wt 212 lb 6.3 oz (96.34 kg)  BMI 31.35 kg/m2  SpO2 98% Exam: General: alert, well-developed, NAD, cooperative HEENT: NCAT, EOMI, MMM, PEERLA, JVD appreciated. [place] in place Lungs: CTAB, comfortable work of breathing, decreased breath sounds on bilateral  lung bases, scattered wheezes. Heart: RRR, systolic murmur appreciated.  Abdomen: Bowel sounds normal; abdomen soft and mildly tender to RUQ palpation. No masses. Under panus samon colored rash with ecchymosis.  Extremities: 2+ bilateral pitting edema of lower extremities, unable to palpate pulses Neurologic: No focal deficits, A&Ox3.  Skin: Intact without suspicious lesions or rashes. Warm and dry. Psych: Mood and affect are normal; no evidence of anxiety or depression.   Labs and Imaging: Results for orders placed or performed during the hospital encounter of 06/18/14 (from the past 24 hour(s))  Brain natriuretic peptide     Status: Abnormal   Collection Time: 06/18/14  5:00 PM  Result Value Ref Range   B Natriuretic Peptide 938.0 (H) 0.0 - 100.0 pg/mL  Basic metabolic panel     Status: Abnormal   Collection Time: 06/18/14  5:00 PM  Result Value Ref Range   Sodium 141 135 - 145 mmol/L   Potassium 4.2 3.5 - 5.1 mmol/L   Chloride 106 96 - 112 mEq/L   CO2 23 19 - 32 mmol/L   Glucose, Bld 85 70 - 99 mg/dL   BUN 10 6 - 23 mg/dL   Creatinine, Ser 0.99 0.50 - 1.35 mg/dL   Calcium 8.8 8.4 - 10.5 mg/dL   GFR calc non Af Amer 83 (L) >90 mL/min   GFR calc Af Amer >90 >90 mL/min   Anion gap 12 5 - 15  CBC     Status:  Abnormal   Collection Time: 06/18/14  5:00 PM  Result Value Ref Range   WBC 6.0 4.0 - 10.5 K/uL   RBC 4.26 4.22 - 5.81 MIL/uL   Hemoglobin 12.1 (L) 13.0 - 17.0 g/dL   HCT 37.0 (L) 39.0 - 52.0 %   MCV 86.9 78.0 - 100.0 fL   MCH 28.4 26.0 - 34.0 pg   MCHC 32.7 30.0 - 36.0 g/dL   RDW 14.5 11.5 - 15.5 %   Platelets 155 150 - 400 K/uL   Dg Chest 2 View  06/18/2014   CLINICAL DATA:  Dyspnea for 3 days.  Diabetes.  Hypertension.  EXAM: CHEST  2 VIEW  COMPARISON:  06/01/2014  FINDINGS: Low lung volumes are present, causing crowding of the pulmonary vasculature. Mild cardiomegaly. Mild atelectasis in the left lower lobe and right middle lobe.  Mild thoracolumbar spondylosis.  IMPRESSION: 1. Mild bibasilar atelectasis. 2. Mild enlargement of the cardiopericardial silhouette 3. Low lung volumes.   Electronically Signed   By: Sherryl Barters M.D.   On: 06/18/2014 18:14    Katheren Shams, DO 06/18/2014, 4:25 PM PGY-1, [place] Intern pager: [phone], text pages welcome  Upper Level Addendum:  I have seen and evaluated this patient along with Dr. Gerarda Fraction and reviewed the above note, making necessary revisions in [facility].   Clearance Coots, MD Family Medicine PGY-2

## 2014-06-18 NOTE — Progress Notes (Addendum)
Patient ID: Patrick [name], male   DOB: 09-08-1950, 65 y.o.   MRN: [id number]  Patrick Rumps, MD Phone: [phone]  Patrick [name] is a 64 y.o. male who presents today for f/u.  Patient presents in follow-up from his last visit for dyspnea. He was given levaquin for a potential PNA and lasix for CHF exacerbation. He reports he does not feel any better. His feet are more swollen. He gets dyspneic with simple activities such as bending over to tie his shoes. He notes malaise as well. He notes he has been coughing and that his blood pressure has been elevated to 180's/120's. Does note chills, though has not checked his temperature. Last echo 06/03/14 with EF 09-98%, grade 1 diastolic dysfunction, no wall motion abnormalities.  Patient is a nonsmoker.   ROS: Per HPI   Physical Exam Filed Vitals:   06/18/14 1442  BP: 190/113  Pulse: 91  Temp: 97.9 F (36.6 C)    Gen: tired appearing, appears short of breath with minimal movement HEENT: PERRL,  MMM Lungs: crackles to mid lungs bilaterally Heart: irregular rhythm on auscultation  Exts: 3+ pitting edema bilaterally to mid shins  EKG: rate 86, sinus rhythm, 1st degree AV block, occasional PVC  Assessment/Plan: Please see individual problem list.  Patrick Rumps, MD [place] PGY-3

## 2014-06-18 NOTE — Assessment & Plan Note (Addendum)
Patient presents with worsening dyspnea and swelling of LE. Likely related to CHF exacerbation and would benefit from hospitalization for IV lasix. Consider PNA, though has been on levaquin and is afebrile. Patient to be direct admitted. CXR, BNP, BMET, and CBC ordered with patient to get lasix 40 mg x1 dose. Inpatient team to see and determine further plan. Sign out called to Dr Ardelia Mems.   Precepted with Dr Gwendlyn Deutscher

## 2014-06-19 ENCOUNTER — Encounter (HOSPITAL_COMMUNITY): Payer: Self-pay | Admitting: General Practice

## 2014-06-19 DIAGNOSIS — D508 Other iron deficiency anemias: Secondary | ICD-10-CM | POA: Insufficient documentation

## 2014-06-19 DIAGNOSIS — R0600 Dyspnea, unspecified: Secondary | ICD-10-CM

## 2014-06-19 DIAGNOSIS — I509 Heart failure, unspecified: Secondary | ICD-10-CM

## 2014-06-19 DIAGNOSIS — I10 Essential (primary) hypertension: Secondary | ICD-10-CM

## 2014-06-19 LAB — BASIC METABOLIC PANEL
Anion gap: 8 (ref 5–15)
Anion gap: 7 (ref 5–15)
BUN: 11 mg/dL (ref 6–23)
BUN: 12 mg/dL (ref 6–23)
CHLORIDE: 101 meq/L (ref 96–112)
CO2: 32 mmol/L (ref 19–32)
CO2: 33 mmol/L — AB (ref 19–32)
Calcium: 8.3 mg/dL — ABNORMAL LOW (ref 8.4–10.5)
Calcium: 8.4 mg/dL (ref 8.4–10.5)
Chloride: 102 mEq/L (ref 96–112)
Creatinine, Ser: 1.13 mg/dL (ref 0.50–1.35)
Creatinine, Ser: 1.37 mg/dL — ABNORMAL HIGH (ref 0.50–1.35)
GFR calc Af Amer: 76 mL/min — ABNORMAL LOW (ref >90)
GFR calc non Af Amer: 66 mL/min — ABNORMAL LOW (ref >90)
GFR, EST AFRICAN AMERICAN: 61 mL/min — AB (ref >90)
GFR, EST NON AFRICAN AMERICAN: 52 mL/min — AB (ref >90)
GLUCOSE: 139 mg/dL — AB (ref 70–99)
Glucose, Bld: 106 mg/dL — ABNORMAL HIGH (ref 70–99)
Potassium: 2.6 mmol/L — CL (ref 3.5–5.1)
Potassium: 3.3 mmol/L — ABNORMAL LOW (ref 3.5–5.1)
Sodium: 142 mmol/L (ref 135–145)
Sodium: 141 mmol/L (ref 135–145)

## 2014-06-19 LAB — CBC
HCT: 32.7 % — ABNORMAL LOW (ref 39.0–52.0)
HEMOGLOBIN: 10.7 g/dL — AB (ref 13.0–17.0)
MCH: 28.2 pg (ref 26.0–34.0)
MCHC: 32.7 g/dL (ref 30.0–36.0)
MCV: 86.1 fL (ref 78.0–100.0)
PLATELETS: 126 10*3/uL — AB (ref 150–400)
RBC: 3.8 MIL/uL — ABNORMAL LOW (ref 4.22–5.81)
RDW: 14.3 % (ref 11.5–15.5)
WBC: 5 10*3/uL (ref 4.0–10.5)

## 2014-06-19 LAB — MAGNESIUM: Magnesium: 1.8 mg/dL (ref 1.5–2.5)

## 2014-06-19 MED ORDER — POTASSIUM CHLORIDE CRYS ER 20 MEQ PO TBCR
40.0000 meq | EXTENDED_RELEASE_TABLET | Freq: Every day | ORAL | Status: DC
  Administered 2014-06-19: 40 meq via ORAL
  Filled 2014-06-19: qty 2

## 2014-06-19 MED ORDER — POTASSIUM CHLORIDE CRYS ER 20 MEQ PO TBCR: 40.0000 meq | EXTENDED_RELEASE_TABLET | Freq: Once | ORAL | Status: DC

## 2014-06-19 MED ORDER — IBUPROFEN 400 MG PO TABS
400.0000 mg | ORAL_TABLET | Freq: Once | ORAL | Status: AC
  Administered 2014-06-19: 400 mg via ORAL
  Filled 2014-06-19: qty 1

## 2014-06-19 MED ORDER — POTASSIUM CHLORIDE CRYS ER 20 MEQ PO TBCR
40.0000 meq | EXTENDED_RELEASE_TABLET | Freq: Two times a day (BID) | ORAL | Status: AC
  Administered 2014-06-19 – 2014-06-20 (×2): 40 meq via ORAL
  Filled 2014-06-19 (×3): qty 2

## 2014-06-19 MED ORDER — FUROSEMIDE 10 MG/ML IJ SOLN
40.0000 mg | Freq: Two times a day (BID) | INTRAMUSCULAR | Status: DC
  Administered 2014-06-19: 40 mg via INTRAVENOUS

## 2014-06-19 MED ORDER — FUROSEMIDE 10 MG/ML IJ SOLN
40.0000 mg | Freq: Four times a day (QID) | INTRAMUSCULAR | Status: DC
  Administered 2014-06-19 – 2014-06-20 (×4): 40 mg via INTRAVENOUS
  Filled 2014-06-19 (×7): qty 4

## 2014-06-19 MED ORDER — POTASSIUM CHLORIDE CRYS ER 20 MEQ PO TBCR
40.0000 meq | EXTENDED_RELEASE_TABLET | Freq: Once | ORAL | Status: AC
  Administered 2014-06-19: 40 meq via ORAL
  Filled 2014-06-19: qty 2

## 2014-06-19 MED ORDER — CARVEDILOL 6.25 MG PO TABS
6.2500 mg | ORAL_TABLET | Freq: Two times a day (BID) | ORAL | Status: DC
  Administered 2014-06-19 – 2014-06-22 (×7): 6.25 mg via ORAL
  Filled 2014-06-19 (×9): qty 1

## 2014-06-19 NOTE — Progress Notes (Signed)
Family Medicine Teaching Service Daily Progress Note Intern Pager: [phone]  Patient name: Patrick [name] Medical record number: [id number] Date of birth: 1950-12-27 Age: 64 y.o. Gender: [name]  Primary Care Provider: Andrena Mews, MD Consultants: None Code Status: Full  Pt Overview and Major Events to Date:  1/20: Admitted for CHF exacerbation  Assessment and Plan: Patrick [name] is a 64 y.o. [name] presenting with acute CHF exacerbation. PMH is significant for CHF, HTN, BPH, T2DM, and HLD.   #Acute on chronic diastolic CHF exacerbation: Echo 05/2014 revealed a normal EF but grade 1 diastolic HF. Volume overloaded on exam. Unsure of the cause of his exacerbation. Doesn't appear to be related to infection. Lasix was stopped at discharge from prior admission due to syncope.  - IV Lasix 40mg  q6hrs  - Continue daily ASA 81mg  - Continue Coreg - troponin x 1 .04 (prior trops elevated .05-.1) - no ischemic changes on EKG - supply oxygen as needed to keep saturations above 90%  - strict I/Os - Daily weights; baseline wt appears to be 220 in clinic, wt today 210 - Will avoid giving IVFs  #Hypokalemic: Today 2.6. Source is diuresis. 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 Asymptomatic. -CBC with Hbg of 10.7 -will continue to monitor  #HTN: BPs elevated.  - Continue home Coreg - Unsure why patient is not on ACE/ARB. It appears at his last admission he had syncopal episode and low BPs. Was previously on Lisinopril but had cough - Continue Losartan; started at low dose so can titrate up if needed - Continue to monitor BPs   #BPH: Stable. Followed by [facility].  - continue finasteride and Flomax  # HLD: Stable - continue Pravastatin 20mg  daily  -consider increasing dose as an outpatient  #T2DM: Diet controlled. Last A1c 5.8 (02/2014). CBGs 85-110. - Continue to monitor glucose with daily CBG -  If elevated, consider adding SSI.  FEN/GI: HH/carb-modified diet/ SLIV Prophylaxis: heparin SQ   Disposition: Continue diureses; pending improvement.   Subjective:  Patient doing well this morning. He says that he has not urinated as much as he would think. Leg swelling has not improved. No overnight events.   Objective: Temp:  [97.7 F (36.5 C)-98.5 F (36.9 C)] 97.9 F (36.6 C) (01/21 0439) Pulse Rate:  [64-91] 64 (01/21 0439) Resp:  [18] 18 (01/21 0439) BP: (154-190)/(64-113) 176/74 mmHg (01/21 0439) SpO2:  [92 %-98 %] 98 % (01/21 0439) Weight:  [210 lb (95.255 kg)-218 lb (98.884 kg)] 210 lb (95.255 kg) (01/21 0439) Physical Exam: General: alert, well-developed, NAD, cooperative HEENT: NCAT, EOMI, MMM Lungs: CTAB, comfortable work of breathing, decreased breath sounds on bilateral lung bases. Heart: RRR, systolic murmur appreciated.  Abdomen: Bowel sounds normal; abdomen soft and non-tender.  Extremities: 2-3+ bilateral pitting edema of lower extremities, unable to palpate pulses Neurologic: No focal deficits, A&Ox3.  Skin: Intact without suspicious lesions or rashes. Warm and dry.   Intake/Output Summary (Last 24 hours) at 06/19/14 0814 Last data filed at 06/19/14 0600  Gross per 24 hour  Intake    840 ml  Output   1875 ml  Net  -1035 ml    Laboratory:  Recent Labs Lab 06/18/14 1700 06/19/14 0347  WBC 6.0 5.0  HGB 12.1* 10.7*  HCT 37.0* 32.7*  PLT 155 126*    Recent Labs Lab 06/18/14 1700 06/19/14 0347  NA 141 142  K 4.2  2.6*  CL 106 102  CO2 23 32  BUN 10 11  CREATININE 0.99 1.13  CALCIUM 8.8 8.3*  GLUCOSE 85 106*   BNP 938 Trop 0.04  Imaging/Diagnostic Tests: Dg Chest 2 View 06/18/2014  IMPRESSION: 1. Mild bibasilar atelectasis. 2. Mild enlargement of the cardiopericardial silhouette 3. Low lung volumes.     Katheren Shams, DO 06/19/2014, 7:37 AM PGY-1, [place] Intern pager: [phone], text pages welcome

## 2014-06-19 NOTE — Consult Note (Signed)
Met with the patient at bedside to offer [facility] services as benefit of his [employer].  Explained services and benefits for care and disease management for follow up, education and resources.  Patient wanted to review the pamphlet and contact given for follow up.  Inpatient RNCM aware of [place] management outreach.   [facility] Care Management will follow up if patient decides he want services. For questions, please call Natividad Brood, RN, BSN, [place] Management at [phone].

## 2014-06-19 NOTE — Care Management Note (Unsigned)
    Page 1 of 1   06/19/2014     4:29:31 PM CARE MANAGEMENT NOTE 06/19/2014  Patient:  Patrick [name], Patrick [name]   Account Number:  [id number]  Date Initiated:  06/19/2014  Documentation initiated by:  Taiki Buckwalter  Subjective/Objective Assessment:   Pt adm on 06/18/14 with CHF exacerbation.  PTA, pt resided at home with sister.     Action/Plan:   Will follow for dc needs as pt progresses.   Anticipated DC Date:  06/22/2014   Anticipated DC Plan:  [place]  CM consult      Choice offered to / List presented to:             Status of service:  In process, will continue to follow Medicare Important Message given?   (If response is "NO", the following Medicare IM given date fields will be blank) Date Medicare IM given:   Medicare IM given by:   Date Additional Medicare IM given:   Additional Medicare IM given by:    Discharge Disposition:    Per UR Regulation:  Reviewed for med. necessity/level of care/duration of stay  If discussed at [place] of Stay Meetings, dates discussed:    Comments:

## 2014-06-20 ENCOUNTER — Ambulatory Visit: Payer: Medicaid Other | Admitting: Family Medicine

## 2014-06-20 LAB — BASIC METABOLIC PANEL
ANION GAP: 13 (ref 5–15)
BUN: 13 mg/dL (ref 6–23)
CHLORIDE: 102 meq/L (ref 96–112)
CO2: 30 mmol/L (ref 19–32)
Calcium: 8.4 mg/dL (ref 8.4–10.5)
Creatinine, Ser: 1.2 mg/dL (ref 0.50–1.35)
GFR calc Af Amer: 71 mL/min — ABNORMAL LOW (ref >90)
GFR calc non Af Amer: 61 mL/min — ABNORMAL LOW (ref >90)
Glucose, Bld: 94 mg/dL (ref 70–99)
POTASSIUM: 2.9 mmol/L — AB (ref 3.5–5.1)
Sodium: 145 mmol/L (ref 135–145)

## 2014-06-20 LAB — CBC
HCT: 35.6 % — ABNORMAL LOW (ref 39.0–52.0)
Hemoglobin: 11.7 g/dL — ABNORMAL LOW (ref 13.0–17.0)
MCH: 27.6 pg (ref 26.0–34.0)
MCHC: 32.9 g/dL (ref 30.0–36.0)
MCV: 84 fL (ref 78.0–100.0)
PLATELETS: 125 10*3/uL — AB (ref 150–400)
RBC: 4.24 MIL/uL (ref 4.22–5.81)
RDW: 14.3 % (ref 11.5–15.5)
WBC: 5 10*3/uL (ref 4.0–10.5)

## 2014-06-20 MED ORDER — FUROSEMIDE 10 MG/ML IJ SOLN
40.0000 mg | Freq: Three times a day (TID) | INTRAMUSCULAR | Status: DC
  Administered 2014-06-20 – 2014-06-21 (×3): 40 mg via INTRAVENOUS
  Filled 2014-06-20 (×3): qty 4

## 2014-06-20 MED ORDER — LOSARTAN POTASSIUM 50 MG PO TABS
50.0000 mg | ORAL_TABLET | Freq: Every day | ORAL | Status: DC
  Administered 2014-06-20 – 2014-06-22 (×3): 50 mg via ORAL
  Filled 2014-06-20 (×3): qty 1

## 2014-06-20 NOTE — Discharge Summary (Signed)
[facility] Discharge Summary  Patient name: Patrick [name] Medical record number: [id number] Date of birth: [birth year] Age: 64 y.o. Gender: male Date of Admission: 06/18/2014  Date of Discharge:  Admitting Physician: Patrick Reasons, MD  Primary Care Provider: Andrena Mews, MD Consultants: None  Indication for Hospitalization: CHF exacerbation  Discharge Diagnoses/Problem List:   Patient Active Problem List   Diagnosis Date Noted  . Other iron deficiency anemias   . CHF exacerbation 06/18/2014  . Dyspnea 06/13/2014  . Mild depression 06/06/2014  . Elevated troponin I level   . Syncope 06/01/2014  . Syncope and collapse   . Type 2 diabetes mellitus without complication   . Healthcare maintenance 03/11/2014  . Obesity, unspecified 10/03/2013  . Hyperlipidemia 08/13/2013  . KNEE PAIN, RIGHT 01/26/2010  . CARDIAC MURMUR 01/26/2010  . MACULAR DEGENERATION 05/02/2008  . INSOMNIA 10/02/2007  . HYPOTHYROIDISM 08/21/2007  . SMALL BOWEL OBSTRUCTION, HX OF 08/21/2007  . ERECTILE DYSFUNCTION 03/13/2007  . DEPRESSION 02/06/2007  . Diet-controlled type 2 diabetes mellitus 01/16/2007  . Essential hypertension 01/16/2007  . Congestive heart failure 01/16/2007  . CHOLELITHIASIS 01/16/2007  . BENIGN PROSTATIC HYPERTROPHY, HX OF 01/16/2007    Disposition: Home   Discharge Condition: Improved  Discharge Exam: Filed Vitals:   06/22/14 0414  BP: 143/75  Pulse: 82  Temp: 97.9 F (36.6 C)  Resp: 18  General: adult male lying in bed in NAD HEENT: NCAT, EOMI, MMM Lungs: CTAB, normal WOB, mildly diminished at bases bilaterally Cardio: RRR, systolic murmur present Abd: BS+, soft, nontender Ext: 1-2+ LE edema, improving; compression stockings in place Neuro: alert, oriented, no gross focal deficit Skin: no rashes appreciated    Brief Hospital Course:  Patrick [name] is a 64 y.o. male who presented with acute CHF exacerbation. PMH is significant for CHF,  HTN, BPH, T2DM, and HLD. Patient presented with new onset 3+ pitting edema in bilateral legs, dyspnea, and orthopnea. Echo performed 05/2014 revealed an normal EF but grade 1 diastolic HF. Etiology of exacerbation is unclear, but possibly due to viral URI given persistent cough; of note Lasix was stopped at a prior discharge due to syncope, but was restarted as an outpt by Dr. Wendi [name] (unclear how much / how often pt was taking this prior to this admission). Details of this admission are as below. At time of discharge, pt's fluid status is net negative ~9 liters with weight about 200 lbs (down from 212 on admission). At time of discharge his dyspnea is much improved, vital signs are stable, and he is able to ambulate.  #Acute on chronic diastolic CHF exacerbation: Echo 05/2014 revealed a normal EF but grade 1 diastolic HF.  - Unsure of the cause of his exacerbation but overall improved; ?related to Lasix stopped at discharge from prior admission due to syncope - transitioned to Lasix 40 mg PO BID for discharge - Continued daily ASA 81mg  and Coreg 6.25 mg BID (pt reports being on a higher dose in the past) - troponin x 1 .04 (prior trops elevated .05-.1) - no ischemic changes on EKG - discussed limiting fluids to prevent overload from too much fluid intake  #Hypokalemic: K 3.0 1/24, likely secondary to diuresis as above; has required considerable repletion - K-Dur 80 mEq given prior to discharge, with continued biweekly K-Dur as an outpt (previous Rx)  #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 Asymptomatic. - Hb stable, 1/22 value of 11.7  #HTN: BPs elevated but improved with increased Losartan -  Continued home Coreg and increased losartan, as above  #BPH: Stable without new complaints. Followed by [facility].  - continued finasteride and Flomax  # HLD: Stable - continued Pravastatin 20mg  daily  - could consider higher dose / more potent agent as an outpt  #T2DM: Diet controlled.  Last A1c 5.8 (02/2014)  Issues for Follow Up:  1. CHF exacerbation - follow up breathing and overall fluid status, and eval for any adjustments to diuretic regimen +/- need for potassium supplementation - STRONGLY consider referral to CHF clinic  2. HTN - monitor BP and adjust medications as needed (continue increased Losartan, reduce back to 25 mg, increase Coreg, etc, depending on how BP trends)  3. HLD - consider high-potency statin or increased dose of Pravachol  Significant Procedures: None  Significant Labs and Imaging:   Recent Labs Lab 06/18/14 1700 06/19/14 0347 06/20/14 0640  WBC 6.0 5.0 5.0  HGB 12.1* 10.7* 11.7*  HCT 37.0* 32.7* 35.6*  PLT 155 126* 125*    Recent Labs Lab 06/19/14 0347 06/19/14 1415 06/20/14 0336 06/21/14 0835 06/22/14 0404  NA 142 141 145 139 139  K 2.6* 3.3* 2.9* 2.8* 3.0*  CL 102 101 102 96 99  CO2 32 33* 30 27 28   GLUCOSE 106* 139* 94 146* 104*  BUN 11 12 13 15 18   CREATININE 1.13 1.37* 1.20 1.17 1.10  CALCIUM 8.3* 8.4 8.4 8.5 8.5  MG 1.8  --   --   --   --    pBNP 1/20: 938 Troponin (serum) 1/20: 0.04  Imaging / Diagnostic Tests: EKG 1/20: NSR, 1st degree A-V block with occasional PAC's / PVC's, RBBB but overall non-ischemic CXR 1/20: Bibasilar atelectasis with low lung volumes and mild cardiac silhouette enlargement  Results/Tests Pending at Time of Discharge: None  Discharge Medications:    Medication List    STOP taking these medications        levofloxacin 500 MG tablet  Commonly known as:  LEVAQUIN      TAKE these medications        aspirin 81 MG EC tablet  Take 1 tablet (81 mg total) by mouth daily.     BIOFLAVONOIDS PO  Take 1 tablet by mouth 2 (two) times daily.     carvedilol 6.25 MG tablet  Commonly known as:  COREG  Take 1 tablet (6.25 mg total) by mouth 2 (two) times daily with a meal.     finasteride 5 MG tablet  Commonly known as:  PROSCAR  Take 5 mg by mouth daily.     fluticasone 50 MCG/ACT  nasal spray  Commonly known as:  FLONASE  Place 2 sprays into both nostrils daily.     furosemide 40 MG tablet  Commonly known as:  LASIX  Take 1 tablet (40 mg total) by mouth 2 (two) times daily.     ibuprofen 800 MG tablet  Commonly known as:  ADVIL,MOTRIN  Take 800 mg by mouth every 12 (twelve) hours as needed for headache or moderate pain.     losartan 50 MG tablet  Commonly known as:  COZAAR  Take 1 tablet (50 mg total) by mouth daily.     lovastatin 20 MG tablet  Commonly known as:  MEVACOR  Take 1 tablet (20 mg total) by mouth at bedtime.     multivitamin with minerals tablet  Take 1 tablet by mouth daily.     omega-3 acid ethyl esters 1 G capsule  Commonly known as:  LOVAZA  Take 2 g by mouth 2 (two) times daily.     potassium chloride SA 20 MEQ tablet  Commonly known as:  K-DUR,KLOR-CON  TAKE 1 TABLET BY MOUTH TWICE A WEEK     senna 8.6 MG Tabs tablet  Commonly known as:  SENOKOT  Take 1 tablet (8.6 mg total) by mouth daily as needed for mild constipation.     tamsulosin 0.4 MG Caps capsule  Commonly known as:  FLOMAX  Take 0.4 mg by mouth daily.     traMADol 50 MG tablet  Commonly known as:  ULTRAM  TAKE 1 TABLET BY MOUTH EVERY 8 HOURS AS NEEDED     traZODone 150 MG tablet  Commonly known as:  DESYREL  TAKE 1 TABLET BY MOUTH EVERY NIGHT AT BEDTIME     vitamin C 1000 MG tablet  Take 2,000 mg by mouth 2 (two) times daily.        Discharge Instructions: Please refer to Patient Instructions section of EMR for full details.  Patient was counseled important signs and symptoms that should prompt return to medical care, changes in medications, dietary instructions, activity restrictions, and follow up appointments.   Follow-Up Appointments: Follow-up Information    Follow up with Patrick Mews, MD.   Specialty:  Family Medicine   Why:  Call [facility] on 1/25 and make a hospital follow-up appointment in 5-10 days   Contact information:    [place] [address] [phone]       This discharge summary was started by Dr. Darcel Bayley prior to discharge and completed by myself on day of discharge.  Sharon Mt Girtha Kilgore, MD 06/22/2014, 9:02 AM PGY-3, [place]

## 2014-06-20 NOTE — Progress Notes (Signed)
Family Medicine Teaching Service Daily Progress Note Intern Pager: [phone]  Patient name: Patrick [name] Medical record number: [id number] Date of birth: 1950/09/05 Age: 64 y.o. Gender: male  Primary Care Provider: Andrena Mews, MD Consultants: None Code Status: Full  Pt Overview and Major Events to Date:  1/20: Admitted for CHF exacerbation  Assessment and Plan: Patrick [name] is a 64 y.o. male presenting with acute CHF exacerbation. PMH is significant for CHF, HTN, BPH, T2DM, and HLD.   #Acute on chronic diastolic CHF exacerbation: Echo 05/2014 revealed a normal EF but grade 1 diastolic HF. Volume overloaded on exam. Unsure of the cause of his exacerbation. Doesn't appear to be related to infection. Lasix was stopped at discharge from prior admission due to syncope. Diuresing well.  - IV Lasix 40mg  q8hrs; will transition to PO tmrw - Continue daily ASA 81mg  - Continue Coreg - troponin x 1 .04 (prior trops elevated .05-.1) - no ischemic changes on EKG - supply oxygen as needed to keep saturations above 90%  - strict I/Os - Net output -6L - Daily weights; came in at 212lbs, today 202lbs - Will avoid giving IVFs but patient encourage to get good PO intake  #Hypokalemic: Today 2.9. Source is diuresis. - will add daily K 66meq BID  #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 Asymptomatic. Stable -CBC with Hbg of 11.7 -will continue to monitor  #HTN: BPs elevated.  - Continue home Coreg  - Continue Losartan; increased dose to 50mg  daily for better BP control - Continue to monitor BPs   #BPH: Stable. Followed by [facility].  - continue finasteride and Flomax  # HLD: Stable - continue Pravastatin 20mg  daily  -consider increasing dose as an outpatient  #T2DM: Diet controlled. Last A1c 5.8 (02/2014). CBGs 85-110. - Continue to monitor glucose with daily CBG - If elevated, consider adding SSI.  FEN/GI: HH/carb-modified diet/ SLIV Prophylaxis: heparin SQ   Disposition:  Continue diureses; pending improvement.   Subjective:  Patient doing well this morning. He has been urinating a lot. Leg swelling has improved some but not back to baseline. Patient able to walk without becoming short of breath. No overnight events.  Objective: Temp:  [97.8 F (36.6 C)-98.2 F (36.8 C)] 98 F (36.7 C) (01/21 2026) Pulse Rate:  [71-76] 72 (01/21 2026) Resp:  [18] 18 (01/21 2026) BP: (151-169)/(78-84) 169/78 mmHg (01/21 2026) SpO2:  [96 %-97 %] 96 % (01/21 2026) Physical Exam: General: alert, well-developed, NAD, cooperative HEENT: NCAT, EOMI, MMM Lungs: CTAB, comfortable work of breathing, decreased breath sounds on bilateral lung bases. Heart: RRR, systolic murmur appreciated.  Abdomen: Bowel sounds normal; abdomen soft and non-tender.  Extremities: 2-3+ bilateral pitting edema of lower extremities, unable to palpate pulses Neurologic: No focal deficits, A&Ox3.  Skin: Intact without suspicious lesions or rashes. Warm and dry.   Intake/Output Summary (Last 24 hours) at 06/20/14 0550 Last data filed at 06/20/14 0450  Gross per 24 hour  Intake   1560 ml  Output   5325 ml  Net  -3765 ml    Laboratory:  Recent Labs Lab 06/18/14 1700 06/19/14 0347  WBC 6.0 5.0  HGB 12.1* 10.7*  HCT 37.0* 32.7*  PLT 155 126*    Recent Labs Lab 06/19/14 0347 06/19/14 1415 06/20/14 0336  NA 142 141 145  K 2.6* 3.3* 2.9*  CL 102 101 102  CO2 32 33* 30  BUN 11 12 13   CREATININE 1.13 1.37* 1.20  CALCIUM 8.3* 8.4 8.4  GLUCOSE 106*  139* 94   BNP 938 Trop 0.04  Imaging/Diagnostic Tests: Dg Chest 2 View 06/18/2014  IMPRESSION: 1. Mild bibasilar atelectasis. 2. Mild enlargement of the cardiopericardial silhouette 3. Low lung volumes.     Katheren Shams, DO 06/20/2014, 5:50 AM PGY-1, [place] Intern pager: [phone], text pages welcome

## 2014-06-20 NOTE — Progress Notes (Signed)
Chaplain responded to spiritual care consult that pt requested prayer. Chaplain provided empathic listening. Pt cares for his sister at home. Pt's faith is of great support to him and gets him through feeling "overwhelmed" at times. Chaplain provided prayer. After prayer, pt said "I feel better." Will follow as needed.   Vanetta Mulders 06/20/2014 4:28 PM

## 2014-06-21 LAB — BASIC METABOLIC PANEL
Anion gap: 16 — ABNORMAL HIGH (ref 5–15)
BUN: 15 mg/dL (ref 6–23)
CO2: 27 mmol/L (ref 19–32)
CREATININE: 1.17 mg/dL (ref 0.50–1.35)
Calcium: 8.5 mg/dL (ref 8.4–10.5)
Chloride: 96 mmol/L (ref 96–112)
GFR calc non Af Amer: 63 mL/min — ABNORMAL LOW (ref >90)
GFR, EST AFRICAN AMERICAN: 73 mL/min — AB (ref >90)
Glucose, Bld: 146 mg/dL — ABNORMAL HIGH (ref 70–99)
Potassium: 2.8 mmol/L — ABNORMAL LOW (ref 3.5–5.1)
SODIUM: 139 mmol/L (ref 135–145)

## 2014-06-21 MED ORDER — BENZONATATE 100 MG PO CAPS
100.0000 mg | ORAL_CAPSULE | Freq: Two times a day (BID) | ORAL | Status: DC | PRN
  Filled 2014-06-21: qty 1

## 2014-06-21 MED ORDER — POTASSIUM CHLORIDE CRYS ER 20 MEQ PO TBCR
40.0000 meq | EXTENDED_RELEASE_TABLET | Freq: Once | ORAL | Status: AC
  Administered 2014-06-21: 40 meq via ORAL

## 2014-06-21 MED ORDER — GUAIFENESIN 100 MG/5ML PO SYRP
200.0000 mg | ORAL_SOLUTION | ORAL | Status: DC | PRN
  Administered 2014-06-21 (×2): 200 mg via ORAL
  Filled 2014-06-21 (×3): qty 10

## 2014-06-21 MED ORDER — BENZONATATE 100 MG PO CAPS
100.0000 mg | ORAL_CAPSULE | Freq: Two times a day (BID) | ORAL | Status: DC
  Administered 2014-06-21: 100 mg via ORAL
  Filled 2014-06-21 (×3): qty 1

## 2014-06-21 MED ORDER — FUROSEMIDE 40 MG PO TABS
40.0000 mg | ORAL_TABLET | Freq: Two times a day (BID) | ORAL | Status: DC
  Administered 2014-06-21 – 2014-06-22 (×3): 40 mg via ORAL
  Filled 2014-06-21 (×5): qty 1

## 2014-06-21 MED ORDER — POTASSIUM CHLORIDE CRYS ER 20 MEQ PO TBCR
40.0000 meq | EXTENDED_RELEASE_TABLET | Freq: Two times a day (BID) | ORAL | Status: AC
  Administered 2014-06-21 (×2): 40 meq via ORAL
  Filled 2014-06-21 (×2): qty 2

## 2014-06-21 MED ORDER — FUROSEMIDE 40 MG PO TABS: 40.0000 mg | ORAL_TABLET | Freq: Two times a day (BID) | ORAL | Status: DC

## 2014-06-21 NOTE — Progress Notes (Addendum)
FMTS Attending Daily Note:  Patrick Sabal MD  [phone] pager  Family Practice pager:  [phone] I have seen and examined this patient and have reviewed their chart. I have discussed this patient with the resident. I agree with the resident's findings, assessment and care plan.  Additionally:  - Continues to improve.  Still with mild orthopnea but better than yesterday.  Becomes "winded" after walking up and down hallway several times.  Lungs clear.  +1 edema BL LE's.  TED hose in place.   - He is doing well from CHF standpoint.  Down >9L since admit.  Switching to PO lasix today.  Plan for observation of continued diuresis and possible DC home tomorrow if continues.  Exercise tolerance much better than prior to admission.  Believe general deconditioning is playing a role as well. - HTN controlled with increase in Losartan. - still with cough.  Add tessalon.   Alveda Reasons, MD 06/21/2014 1:33 PM   ----------------------------------------------------------------------------------------------------------------------- Family Medicine Teaching Service Daily Progress Note Intern Pager: [phone]  Patient name: Patrick [name] Medical record number: [id number] Date of birth: Jul 13, 1950 Age: 64 y.o. Gender: male  Primary Care Provider: Andrena Mews, MD Consultants: None Code Status: Full  Pt Overview and Major Events to Date:  1/20: Admitted for CHF exacerbation  Assessment and Plan: Patrick [name] is a 64 y.o. male presenting with acute CHF exacerbation. PMH is significant for CHF, HTN, BPH, T2DM, and HLD.   #Acute on chronic diastolic CHF exacerbation: Echo 05/2014 revealed a normal EF but grade 1 diastolic HF.  - Unsure of the cause of his exacerbation but overall improved; ?related to Lasix stopped at discharge from prior admission due to syncope. - transition to Lasix 40 mg BID (already received 40 mg IV 1/23 AM at 0630) - consider discharge on 40 mg BID or 40 mg daily (previous home  dose) - Continue daily ASA 81mg  - Continue Coreg - troponin x 1 .04 (prior trops elevated .05-.1) - no ischemic changes on EKG - supply oxygen as needed to keep saturations above 90%  - strict I/Os - Net output -8.5 L - Daily weights; came in at 212lbs, down to 413 (uncertain dry-weight) - avoid IVFs but encourage good PO intake  #Hypokalemic: K 2.9 early AM 1/22, likely secondary to diuresis as above - given K-Dur 40 mEq x1 on 1/22 but K not rechecked - repeat BMP pending this AM, given K-Dur 40 mEq again - monitor BMP daily until discharge  #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 Asymptomatic. - Hb stable, 1/22 value of 11.7  #HTN: BPs elevated but improved with increased Losartan - Continue home Coreg  - Continue Losartan at increased dose of 50mg  daily - Continue to monitor BPs   #BPH: Stable without new complaints. Followed by [facility].  - continue finasteride and Flomax  # HLD: Stable - continue Pravastatin 20mg  daily  -consider increasing dose as an outpatient  #T2DM: Diet controlled. Last A1c 5.8 (02/2014) - Continue to monitor glucose with daily CBG - If elevated, consider adding SSI.  FEN/GI: HH/carb-modified diet/ SLIV Prophylaxis: heparin SQ  Disposition: management as above; possible discharge home, tomorrow  Subjective:  Patient reports overall much improved leg swelling and breathing. Reports some orthopnea, still, unable to lie completely flat, but better. Denies chest pain or frank SOB at rest. Pt has been able to ambulate.  Objective: Temp:  [97.7 F (36.5 C)-98.3 F (36.8 C)] 98.2 F (36.8 C) (01/23 0526) Pulse Rate:  [74-91]  91 (01/23 0900) Resp:  [18-20] 18 (01/23 0526) BP: (130-164)/(73-81) 130/76 mmHg (01/23 0900) SpO2:  [93 %-96 %] 93 % (01/23 0526) Weight:  [199 lb 12.8 oz (90.629 kg)] 199 lb 12.8 oz (90.629 kg) (01/23 0526) Physical Exam: General: adult male lying in bed in NAD HEENT: NCAT, EOMI, MMM Lungs: CTAB, normal WOB,  mildly diminished at bases bilaterally Cardio: RRR, systolic murmur present Abd: BS+, soft, nontender Ext: 1-2+ LE edema, improving; compression stockings in place Neuro: alert, oriented, no gross focal deficit Skin: no rashes appreciated   Intake/Output Summary (Last 24 hours) at 06/21/14 0934 Last data filed at 06/21/14 0926  Gross per 24 hour  Intake   1863 ml  Output   4480 ml  Net  -2617 ml    Laboratory:  Recent Labs Lab 06/18/14 1700 06/19/14 0347 06/20/14 0640  WBC 6.0 5.0 5.0  HGB 12.1* 10.7* 11.7*  HCT 37.0* 32.7* 35.6*  PLT 155 126* 125*    Recent Labs Lab 06/19/14 0347 06/19/14 1415 06/20/14 0336  NA 142 141 145  K 2.6* 3.3* 2.9*  CL 102 101 102  CO2 32 33* 30  BUN 11 12 13   CREATININE 1.13 1.37* 1.20  CALCIUM 8.3* 8.4 8.4  GLUCOSE 106* 139* 94   BNP 938 Trop 0.04  Imaging/Diagnostic Tests: Dg Chest 2 View 06/18/2014  IMPRESSION: 1. Mild bibasilar atelectasis. 2. Mild enlargement of the cardiopericardial silhouette 3. Low lung volumes.     Emmaline Kluver, MD 06/21/2014, 9:34 AM PGY-3, [place] Intern pager: [phone], text pages welcome

## 2014-06-22 LAB — BASIC METABOLIC PANEL
ANION GAP: 12 (ref 5–15)
BUN: 18 mg/dL (ref 6–23)
CALCIUM: 8.5 mg/dL (ref 8.4–10.5)
CHLORIDE: 99 mmol/L (ref 96–112)
CO2: 28 mmol/L (ref 19–32)
CREATININE: 1.1 mg/dL (ref 0.50–1.35)
GFR calc Af Amer: 79 mL/min — ABNORMAL LOW (ref >90)
GFR, EST NON AFRICAN AMERICAN: 68 mL/min — AB (ref >90)
Glucose, Bld: 104 mg/dL — ABNORMAL HIGH (ref 70–99)
Potassium: 3 mmol/L — ABNORMAL LOW (ref 3.5–5.1)
SODIUM: 139 mmol/L (ref 135–145)

## 2014-06-22 MED ORDER — POTASSIUM CHLORIDE CRYS ER 20 MEQ PO TBCR
40.0000 meq | EXTENDED_RELEASE_TABLET | ORAL | Status: AC
  Administered 2014-06-22 (×2): 40 meq via ORAL
  Filled 2014-06-22: qty 2

## 2014-06-22 MED ORDER — LOSARTAN POTASSIUM 50 MG PO TABS: 50.0000 mg | ORAL_TABLET | Freq: Every day | ORAL | Status: DC

## 2014-06-22 MED ORDER — FUROSEMIDE 40 MG PO TABS: 40.0000 mg | ORAL_TABLET | Freq: Two times a day (BID) | ORAL | Status: DC

## 2014-06-22 NOTE — Progress Notes (Signed)
Patient alert oriented , c/o pain to leg, patient said he will take his pain med at home. V/S stable, no shortness of breath. Iv d/c, d/c instruction given, patient verbalized understanding. D/C patient per order.

## 2014-06-22 NOTE — Discharge Instructions (Signed)
You were admitted for a heart failure exacerbation and we removed about 9 liters of fluid with diuretic medicines. You should call the clinic at [phone] on Monday 1/25 to set up a follow-up appointment with Dr. Gwendlyn Deutscher in about 1 week. We prescribed two medicines for you: - Lasix (furosemide) to take 40 mg twice per day - Cozaar (losartan) to take 50 mg once a day (this is an increase from 25 mg once a day) You can take Robitussin over the counter for your cough, as needed. Keep taking your other medicines without any changes. Call or come back to see [place] or come back to the emergency room if your symptoms are getting worse again instead of better.

## 2014-06-24 ENCOUNTER — Ambulatory Visit: Payer: Medicaid Other

## 2014-06-24 ENCOUNTER — Ambulatory Visit: Payer: Medicaid Other | Admitting: Physician Assistant

## 2014-06-25 ENCOUNTER — Other Ambulatory Visit: Payer: Commercial Managed Care - HMO

## 2014-06-25 ENCOUNTER — Telehealth: Payer: Self-pay | Admitting: Family Medicine

## 2014-06-25 ENCOUNTER — Other Ambulatory Visit: Payer: Self-pay | Admitting: Family Medicine

## 2014-06-25 DIAGNOSIS — E876 Hypokalemia: Secondary | ICD-10-CM

## 2014-06-25 LAB — BASIC METABOLIC PANEL
BUN: 15 mg/dL (ref 6–23)
CHLORIDE: 106 meq/L (ref 96–112)
CO2: 30 mEq/L (ref 19–32)
Calcium: 8.6 mg/dL (ref 8.4–10.5)
Creat: 1.04 mg/dL (ref 0.50–1.35)
Glucose, Bld: 90 mg/dL (ref 70–99)
Potassium: 3.6 mEq/L (ref 3.5–5.3)
SODIUM: 143 meq/L (ref 135–145)

## 2014-06-25 NOTE — Telephone Encounter (Signed)
Requesting [employer] referral, pt is scheduled on Monday 06/30/14 at 2:30 pm, Pt switched doctors, now seeing Dr. Little Ishikawa.

## 2014-06-25 NOTE — Progress Notes (Signed)
BMP DONE TODAY Patrick [name] 

## 2014-06-25 NOTE — Telephone Encounter (Signed)
Authorization received, Janett Billow informed. Kayode Petion, [name]

## 2014-06-25 NOTE — Telephone Encounter (Signed)
Have him come in to the lab to check his potassium. I will place lab order.

## 2014-06-25 NOTE — Telephone Encounter (Signed)
Will forward to MD to review labs and see what dose he should be taking. Omaira Mellen,CMA

## 2014-06-25 NOTE — Telephone Encounter (Signed)
Pt is coming in today for lab appt. Patrick [name],CMA

## 2014-06-25 NOTE — Telephone Encounter (Signed)
Pt was informed that his potassium was a little low while in hospital.  Medication was given for potassium and lasix was prescribed 40 mg for twice daily.  Patient experiencing a lot of pain to his rt flank area.  Concerned that his postassium has not been increased.  Still only taking once tab twice weekly.  Please advise as to what patient needs to have

## 2014-06-26 ENCOUNTER — Telehealth: Payer: Self-pay | Admitting: *Deleted

## 2014-06-26 ENCOUNTER — Encounter: Payer: Self-pay | Admitting: Family Medicine

## 2014-06-26 ENCOUNTER — Ambulatory Visit (INDEPENDENT_AMBULATORY_CARE_PROVIDER_SITE_OTHER): Payer: Commercial Managed Care - HMO | Admitting: Family Medicine

## 2014-06-26 VITALS — BP 176/92 | HR 78 | Temp 99.3°F | Ht 69.0 in | Wt 207.7 lb

## 2014-06-26 DIAGNOSIS — M25561 Pain in right knee: Secondary | ICD-10-CM

## 2014-06-26 DIAGNOSIS — M25562 Pain in left knee: Secondary | ICD-10-CM

## 2014-06-26 NOTE — Telephone Encounter (Signed)
Pt is aware of results.  Will discuss at follow up cramping issues. Patrick [name],CMA

## 2014-06-26 NOTE — Telephone Encounter (Signed)
-----   Message from Andrena Mews, MD sent at 06/26/2014  1:13 AM EST ----- Inform patient his potassium level is normal, he does not have to do anything different.

## 2014-06-26 NOTE — Patient Instructions (Signed)
Thank you for coming in,   You can apply heat and ice on your knees. Apply for 20 minutes on and off at least 4 times a day.   You can buy a knee compression sleeve for your knees to help with the stabilization.   If this continues 4 weeks from now, please follow up with me or Dr. Gwendlyn Deutscher.    Please feel free to call with any questions or concerns at any time, at [phone]. --Dr. Raeford Razor

## 2014-06-27 DIAGNOSIS — M25562 Pain in left knee: Principal | ICD-10-CM

## 2014-06-27 DIAGNOSIS — M25561 Pain in right knee: Secondary | ICD-10-CM | POA: Insufficient documentation

## 2014-06-27 NOTE — Assessment & Plan Note (Signed)
Pain occuring on lateral aspect of knee proximally at the insertion of IT band. Pain radiate to the anterior knee and is worse upon initial flexion of the knee. 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 Could be associated with weakened Quad from recent hospitalization. Electrolytes were normal even though with hx of hypokalemia.  - tylenol for pain   - - significant HF so avoiding NSAIDS  - given home modalities  - knee compression sleeve b/l  - ice PRN  - f/u if symptoms persists at 4 weeks   -  - may consider imaging or formal referral to PT

## 2014-06-27 NOTE — Progress Notes (Signed)
    Subjective   Patrick [name] is a 64 y.o. male that presents for a same day visit  EXTREMITY PAIN  Location: lateral aspect of both knees  Pain started: started when he was discharged from the hospital 4 days ago  Pain is:  Sharp and intermittent  Severity: ranges from 0-10/10 Medications tried: none Recent trauma: no Similar pain previously: no  Symptoms Redness:no Swelling:no Fever: no Weakness: no Weight loss: no Rash: no   Review of Symptoms - see HPI PMH - Smoking status noted.    History  Substance Use Topics  . Smoking status: Former Smoker    Types: Pipe, Landscape architect  . Smokeless tobacco: Never Used     Comment: 06/19/2014 "stopped smoking in ~ 2014; used to smoke a pipe or cigar a couple times/month"  . Alcohol Use: No    ROS Per HPI  Objective   BP 176/92 mmHg  Pulse 78  Temp(Src) 99.3 F (37.4 C) (Oral)  Ht 5\' 9"  (1.753 m)  Wt 207 lb 11.2 oz (94.212 kg)  BMI 30.66 kg/m2  General: Well appearing, NAD, alert Knee Exam:  Laterality: right Appearance: symmetric  Edema: no   Tenderness: yes  Lateral. No joint line tenderness. Pain reproduced upon extension at 20-30 degrees. Pain relieved upon max extension  Range of Motion: Passive Extension: normal Flexion:normal Active Extension: normal Flexion: normal Laxity: none Maneuvers: McMurray's: neg Strength:  Quadricep: 5/5 Hamstring: 5/5 Gait: normal Neurovascularly intact  Knee Exam:  Laterality: left Appearance: symmetric  Edema: no   Tenderness: yes  Lateral. No joint line tenderness. Pain reproduced upon extension at 20-30 degrees. Pain relieved upon max extension  Range of Motion: Passive Extension: normal Flexion:normal Active Extension: normal Flexion: normal Laxity: none Maneuvers: McMurray's: neg Strength:  Quadricep: 5/5 Hamstring: 5/5 Gait: normal Neurovascularly intact     Assessment and Plan   Please refer to problem based charting of assessment and plan

## 2014-06-30 ENCOUNTER — Encounter: Payer: Self-pay | Admitting: Podiatry

## 2014-06-30 ENCOUNTER — Ambulatory Visit: Payer: Commercial Managed Care - HMO

## 2014-06-30 ENCOUNTER — Ambulatory Visit (INDEPENDENT_AMBULATORY_CARE_PROVIDER_SITE_OTHER): Payer: Commercial Managed Care - HMO | Admitting: Podiatry

## 2014-06-30 DIAGNOSIS — E1140 Type 2 diabetes mellitus with diabetic neuropathy, unspecified: Secondary | ICD-10-CM | POA: Diagnosis not present

## 2014-06-30 DIAGNOSIS — B351 Tinea unguium: Secondary | ICD-10-CM

## 2014-06-30 DIAGNOSIS — M2011 Hallux valgus (acquired), right foot: Secondary | ICD-10-CM

## 2014-06-30 DIAGNOSIS — M79676 Pain in unspecified toe(s): Secondary | ICD-10-CM | POA: Diagnosis not present

## 2014-06-30 DIAGNOSIS — M2012 Hallux valgus (acquired), left foot: Secondary | ICD-10-CM | POA: Diagnosis not present

## 2014-06-30 NOTE — Patient Instructions (Signed)

## 2014-06-30 NOTE — Progress Notes (Signed)
Patient ID: ARJUN HARD, male   DOB: 1951/04/22, 64 y.o.   MRN: [id number]  Subjective: This patient presents today complaining of painful toenails when walking wearing shoes. 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

## 2014-07-01 ENCOUNTER — Ambulatory Visit (INDEPENDENT_AMBULATORY_CARE_PROVIDER_SITE_OTHER): Payer: Commercial Managed Care - HMO | Admitting: Family Medicine

## 2014-07-01 ENCOUNTER — Encounter: Payer: Self-pay | Admitting: Family Medicine

## 2014-07-01 VITALS — BP 154/118 | HR 101 | Temp 98.0°F | Wt 204.0 lb

## 2014-07-01 DIAGNOSIS — I10 Essential (primary) hypertension: Secondary | ICD-10-CM

## 2014-07-01 DIAGNOSIS — I509 Heart failure, unspecified: Secondary | ICD-10-CM

## 2014-07-01 DIAGNOSIS — M25561 Pain in right knee: Secondary | ICD-10-CM

## 2014-07-01 DIAGNOSIS — E785 Hyperlipidemia, unspecified: Secondary | ICD-10-CM

## 2014-07-01 MED ORDER — CARVEDILOL 12.5 MG PO TABS: 12.5000 mg | ORAL_TABLET | Freq: Two times a day (BID) | ORAL | Status: DC

## 2014-07-01 NOTE — Assessment & Plan Note (Signed)
Euvolemic. Continue lasix 40 mg BID. Check Bmet in 4 wks.

## 2014-07-01 NOTE — Progress Notes (Signed)
Subjective:     Patient ID: Patrick [name], male   DOB: Nov 10, 1950, 64 y.o.   MRN: [id number]  HPI  HTN/HLD:He is compliant with is medications, here for follow up. For his HLD he currently on Lovastain 20 mg qd. He mentioned his BP remains high at home, he was on Lisinopril 40, Coreg 25 BID, Triamterene 75/50 qd, lasix 40 mg BID daily in the past but during his recent hospital admission for hypotension, his Triamterene/HCTZ, Lisinopril was d/c and Coreg was reduced to 6.25 mg BID. He is here to discuss his medication. YQM:VHQI for follow up after hospital admission for exacerbation. He is now back on Lasix 40 mg BID, leg swelling has improved a lot. Knee pain: Right knee, worse in the morning with about 5 steps, improves gradually during the day. Feeling better today  Current Outpatient Prescriptions on File Prior to Visit  Medication Sig Dispense Refill  . aspirin EC 81 MG EC tablet Take 1 tablet (81 mg total) by mouth daily. 30 tablet 0  . finasteride (PROSCAR) 5 MG tablet Take 5 mg by mouth daily.    . furosemide (LASIX) 40 MG tablet Take 1 tablet (40 mg total) by mouth 2 (two) times daily. 60 tablet 1  . losartan (COZAAR) 50 MG tablet Take 1 tablet (50 mg total) by mouth daily. 30 tablet 1  . lovastatin (MEVACOR) 20 MG tablet Take 1 tablet (20 mg total) by mouth at bedtime. 90 tablet 1  . Multiple Vitamins-Minerals (MULTIVITAMIN WITH MINERALS) tablet Take 1 tablet by mouth daily.    Marland Kitchen omega-3 acid ethyl esters (LOVAZA) 1 G capsule Take 2 g by mouth 2 (two) times daily.    . potassium chloride SA (K-DUR,KLOR-CON) 20 MEQ tablet TAKE 1 TABLET BY MOUTH TWICE A WEEK 25 tablet 1  . tamsulosin (FLOMAX) 0.4 MG CAPS capsule Take 0.4 mg by mouth daily.     . traMADol (ULTRAM) 50 MG tablet TAKE 1 TABLET BY MOUTH EVERY 8 HOURS AS NEEDED 90 tablet 2  . traZODone (DESYREL) 150 MG tablet TAKE 1 TABLET BY MOUTH EVERY NIGHT AT BEDTIME 90 tablet 1  . Ascorbic Acid (VITAMIN C) 1000 MG tablet Take 2,000 mg by mouth  2 (two) times daily.    Marland Kitchen BIOFLAVONOIDS PO Take 1 tablet by mouth 2 (two) times daily.    . fluticasone (FLONASE) 50 MCG/ACT nasal spray Place 2 sprays into both nostrils daily. (Patient not taking: Reported on 07/01/2014) 16 g 6  . ibuprofen (ADVIL,MOTRIN) 800 MG tablet Take 800 mg by mouth every 12 (twelve) hours as needed for headache or moderate pain.    Marland Kitchen senna (SENOKOT) 8.6 MG TABS tablet Take 1 tablet (8.6 mg total) by mouth daily as needed for mild constipation. (Patient not taking: Reported on 07/01/2014) 30 each 0   No current facility-administered medications on file prior to visit.   Past Medical History  Diagnosis Date  . CHF (congestive heart failure)   . Hypertension   . Hyperlipidemia   . Enlarged prostate   . Pneumonia 05/2014  . DM II (diabetes mellitus, type II), controlled     Diet controlled.   . Degenerative joint disease of knee, right   . Arthritis     "right hand; right knee" (06/19/2014)      Review of Systems  Respiratory: Negative.   Cardiovascular: Negative.   Gastrointestinal: Negative.   Musculoskeletal: Positive for arthralgias.       Knee pain  Neurological: Negative.  All other systems reviewed and are negative.      Filed Vitals:   07/01/14 1049  BP: 154/118  Pulse: 101  Temp: 98 F (36.7 C)  TempSrc: Oral  Weight: 204 lb (92.534 kg)  SpO2: 96%     Objective:   Physical Exam  Constitutional: He is oriented to person, place, and time. He appears well-developed. No distress.  Cardiovascular: Normal rate, regular rhythm and normal heart sounds.   No murmur heard. Pulmonary/Chest: Effort normal and breath sounds normal. No respiratory distress. He has no wheezes.  Abdominal: Soft. Bowel sounds are normal. He exhibits no distension and no mass. There is no tenderness.  Musculoskeletal: Normal range of motion. He exhibits no edema or tenderness.       Right knee: Normal.       Left knee: Normal.  Neurological: He is alert and oriented to  person, place, and time.  Nursing note and vitals reviewed.      Assessment:     HTN CHF HLD Knee pain     Plan:     Check problem list

## 2014-07-01 NOTE — Assessment & Plan Note (Signed)
Doing well on Lovastatin. Continue same dose.

## 2014-07-01 NOTE — Assessment & Plan Note (Signed)
BP still high despite complaince. We will go up on Coreg to 12.5 mg BID. Continue other current BP regimen. F/U in 4 wks for reassessment.

## 2014-07-01 NOTE — Patient Instructions (Addendum)
It was nice seeing you today but your BP is still running high,please go up on your Coreg to 12.5mg  BID. I will see you back in 4wk. Please continue home BP check, if low, let us know.

## 2014-07-01 NOTE — Assessment & Plan Note (Signed)
Likely arthritis vs muscle strain. Currently asymptomatic. Monitor for now.

## 2014-07-04 ENCOUNTER — Ambulatory Visit: Payer: Commercial Managed Care - HMO | Admitting: Family Medicine

## 2014-07-18 ENCOUNTER — Encounter: Payer: Self-pay | Admitting: Cardiology

## 2014-07-18 ENCOUNTER — Ambulatory Visit (INDEPENDENT_AMBULATORY_CARE_PROVIDER_SITE_OTHER): Payer: Commercial Managed Care - HMO | Admitting: Cardiology

## 2014-07-18 VITALS — BP 130/80 | HR 67 | Ht 69.0 in | Wt 210.0 lb

## 2014-07-18 DIAGNOSIS — R748 Abnormal levels of other serum enzymes: Secondary | ICD-10-CM

## 2014-07-18 DIAGNOSIS — I509 Heart failure, unspecified: Secondary | ICD-10-CM | POA: Diagnosis not present

## 2014-07-18 DIAGNOSIS — R0600 Dyspnea, unspecified: Secondary | ICD-10-CM | POA: Diagnosis not present

## 2014-07-18 LAB — BASIC METABOLIC PANEL
BUN: 15 mg/dL (ref 6–23)
CHLORIDE: 102 meq/L (ref 96–112)
CO2: 35 meq/L — AB (ref 19–32)
Calcium: 9 mg/dL (ref 8.4–10.5)
Creat: 1.15 mg/dL (ref 0.50–1.35)
GLUCOSE: 87 mg/dL (ref 70–99)
POTASSIUM: 3.7 meq/L (ref 3.5–5.3)
SODIUM: 143 meq/L (ref 135–145)

## 2014-07-18 MED ORDER — CARVEDILOL 6.25 MG PO TABS: 6.2500 mg | ORAL_TABLET | Freq: Two times a day (BID) | ORAL | Status: DC

## 2014-07-18 NOTE — Patient Instructions (Signed)
Your physician recommends that you schedule a follow-up appointment in: 3 months with Dr. Percival Spanish  We have ordered labs for you to get done  We have ordered a stress test

## 2014-07-18 NOTE — Progress Notes (Signed)
HPI The patient presents for follow-up after a syncopal episode. He was hospitalized and we did see him in consultation. In 2009 he had a mildly reduced ejection fraction. However, at the time of his hospitalization in January of this year his EF was well-preserved. He did have some PVCs. He apparently had one triplet noted. There was one elevated troponin. He is back here for follow-up with a suggestion that he might need further stress testing.  At the time of that early January hospitalization he was taken off diuretics. However, he came back a couple of weeks later with volume overloaded had to be restarted on Lasix. I see that admission he was diuresed 12 pounds or 9 L. He is now breathing back at baseline. He denies any severe cardiovascular symptoms. He is not having any chest pressure, neck or arm discomfort. He's had no palpitations, presyncope or syncope. He has no PND or orthopnea. His lower extremity swelling is improved. He is watching his salt.  No Known Allergies  Current Outpatient Prescriptions  Medication Sig Dispense Refill  . Ascorbic Acid (VITAMIN C) 1000 MG tablet Take 2,000 mg by mouth 2 (two) times daily.    Marland Kitchen aspirin EC 81 MG EC tablet Take 1 tablet (81 mg total) by mouth daily. 30 tablet 0  . BIOFLAVONOIDS PO Take 1 tablet by mouth 2 (two) times daily.    . carvedilol (COREG) 12.5 MG tablet Take 1 tablet (12.5 mg total) by mouth 2 (two) times daily with a meal. 60 tablet 1  . finasteride (PROSCAR) 5 MG tablet Take 5 mg by mouth daily.    . fluticasone (FLONASE) 50 MCG/ACT nasal spray Place 2 sprays into both nostrils daily. 16 g 6  . furosemide (LASIX) 40 MG tablet Take 1 tablet (40 mg total) by mouth 2 (two) times daily. 60 tablet 1  . ibuprofen (ADVIL,MOTRIN) 800 MG tablet Take 800 mg by mouth every 12 (twelve) hours as needed for headache or moderate pain.    Marland Kitchen losartan (COZAAR) 50 MG tablet Take 1 tablet (50 mg total) by mouth daily. 30 tablet 1  . lovastatin  (MEVACOR) 20 MG tablet Take 1 tablet (20 mg total) by mouth at bedtime. 90 tablet 1  . Multiple Vitamins-Minerals (MULTIVITAMIN WITH MINERALS) tablet Take 1 tablet by mouth daily.    Marland Kitchen omega-3 acid ethyl esters (LOVAZA) 1 G capsule Take 2 g by mouth 2 (two) times daily.    . potassium chloride SA (K-DUR,KLOR-CON) 20 MEQ tablet TAKE 1 TABLET BY MOUTH TWICE A WEEK 25 tablet 1  . senna (SENOKOT) 8.6 MG TABS tablet Take 1 tablet (8.6 mg total) by mouth daily as needed for mild constipation. 30 each 0  . tamsulosin (FLOMAX) 0.4 MG CAPS capsule Take 0.4 mg by mouth daily.     . traMADol (ULTRAM) 50 MG tablet TAKE 1 TABLET BY MOUTH EVERY 8 HOURS AS NEEDED 90 tablet 2  . traZODone (DESYREL) 150 MG tablet TAKE 1 TABLET BY MOUTH EVERY NIGHT AT BEDTIME 90 tablet 1   No current facility-administered medications for this visit.    Past Medical History  Diagnosis Date  . CHF (congestive heart failure)   . Hypertension   . Hyperlipidemia   . Enlarged prostate   . Pneumonia 05/2014  . DM II (diabetes mellitus, type II), controlled     Diet controlled.   . Degenerative joint disease of knee, right   . Arthritis     "right hand; right knee" (  06/19/2014)    Past Surgical History  Procedure Laterality Date  . Inguinal hernia repair Left 1990's    ROS:  As stated in the HPI and negative for all other systems.  PHYSICAL EXAM BP 130/80 mmHg  Pulse 67  Ht 5\' 9"  (1.753 m)  Wt 210 lb (95.255 kg)  BMI 31.00 kg/m2 GENERAL:  Well appearing HEENT:  Pupils equal round and reactive, fundi not visualized, oral mucosa unremarkable NECK:  Positive jugular venous distention, waveform within normal limits, carotid upstroke brisk and symmetric, no bruits, no thyromegaly LYMPHATICS:  No cervical, inguinal adenopathy LUNGS:  Clear to auscultation bilaterally BACK:  No CVA tenderness CHEST:  Unremarkable HEART:  PMI not displaced or sustained,S1 within normal limits, fixed split S2, no S3, no S4, no clicks, no  rubs, no murmurs ABD:  Flat, positive bowel sounds normal in frequency in pitch, no bruits, no rebound, no guarding, no midline pulsatile mass, no hepatomegaly, no splenomegaly EXT:  2 plus pulses throughout, trace edema, no cyanosis no clubbing SKIN:  No rashes no nodules NEURO:  Cranial nerves II through XII grossly intact, motor grossly intact throughout PSYCH:  Cognitively intact, oriented to person place and time   ASSESSMENT AND PLAN  Elevated troponin:  The patient did have a mildly elevated troponin at the time of his hospitalization so he will have a [employer].  PVCs: These are not problematic. No further cardiovascular testing is suggested.  Syncope:  He has had no further syncope. No further evaluation is planned.  Mild renal insufficiency:  I will check a basic metabolic profile.  Previously reduced ejection fraction:  His EF is preserved. However, he does have obvious diastolic dysfunction and his volume sensitive. He will continue the diuretics as listed.  HTN:  His blood pressure is mildly elevated. I'm going to titrate his carvedilol to 18.75 mg twice a day.

## 2014-07-31 ENCOUNTER — Telehealth (HOSPITAL_COMMUNITY): Payer: Self-pay

## 2014-07-31 NOTE — Telephone Encounter (Signed)
Encounter complete. 

## 2014-08-01 ENCOUNTER — Telehealth (HOSPITAL_COMMUNITY): Payer: Self-pay

## 2014-08-01 NOTE — Telephone Encounter (Signed)
Encounter complete. 

## 2014-08-05 ENCOUNTER — Ambulatory Visit (HOSPITAL_COMMUNITY)
Admission: RE | Admit: 2014-08-05 | Discharge: 2014-08-05 | Disposition: A | Discharge status: Home / Self Care | Payer: Commercial Managed Care - HMO | Source: Ambulatory Visit | Attending: Cardiology | Admitting: Cardiology

## 2014-08-05 DIAGNOSIS — R0609 Other forms of dyspnea: Secondary | ICD-10-CM | POA: Diagnosis present

## 2014-08-05 DIAGNOSIS — I509 Heart failure, unspecified: Secondary | ICD-10-CM | POA: Diagnosis not present

## 2014-08-05 DIAGNOSIS — R748 Abnormal levels of other serum enzymes: Secondary | ICD-10-CM

## 2014-08-05 DIAGNOSIS — R0600 Dyspnea, unspecified: Secondary | ICD-10-CM | POA: Diagnosis not present

## 2014-08-05 DIAGNOSIS — R002 Palpitations: Secondary | ICD-10-CM | POA: Insufficient documentation

## 2014-08-05 DIAGNOSIS — I10 Essential (primary) hypertension: Secondary | ICD-10-CM | POA: Diagnosis not present

## 2014-08-05 MED ORDER — REGADENOSON 0.4 MG/5ML IV SOLN
0.4000 mg | Freq: Once | INTRAVENOUS | Status: AC
  Administered 2014-08-05: 0.4 mg via INTRAVENOUS

## 2014-08-05 MED ORDER — TECHNETIUM TC 99M SESTAMIBI GENERIC - CARDIOLITE
10.2000 | Freq: Once | INTRAVENOUS | Status: AC | PRN
  Administered 2014-08-05: 10 via INTRAVENOUS

## 2014-08-05 MED ORDER — TECHNETIUM TC 99M SESTAMIBI GENERIC - CARDIOLITE
32.4000 | Freq: Once | INTRAVENOUS | Status: AC | PRN
  Administered 2014-08-05: 32 via INTRAVENOUS

## 2014-08-05 NOTE — Procedures (Addendum)
[name] [address] [place] [address] [phone]  Cardiology Nuclear Med Study  [name] is a 64 y.o. male     MRN : [id number]     DOB: June 14, 1950  Procedure Date: 08/05/2014  Nuclear Med Background Indication for Stress Test:  Evaluation for Ischemia and Post Hospital History:  CHF Cardiac Risk Factors: Hypertension and NIDDM  Symptoms:  DOE and Palpitations   Nuclear Pre-Procedure Caffeine/Decaff Intake:  1:00am NPO After: 9:00am   IV Site: R Antecubital  IV 0.9% NS with Angio Cath:  22g  Chest Size (in):  44 IV Started by: Otho Perl, CNMT  Height: 5\' 9"  (1.753 m)  Cup Size: n/a  BMI:  Body mass index is 31 kg/(m^2). Weight:  210 lb (95.255 kg)   Tech Comments:  n/a    Nuclear Med Study 1 or 2 day study: 1 day  Stress Test Type:  [place] Provider:  Minus Breeding, MD   Resting Radionuclide: Technetium 32m Sestamibi  Resting Radionuclide Dose: 10.2 mCi   Stress Radionuclide:  Technetium 41m Sestamibi  Stress Radionuclide Dose: 32.4 mCi           Stress Protocol Rest HR: 65 Stress HR: 80  Rest BP: 177/103 Stress BP: 181/95  Exercise Time (min): n/a METS: n/a          Dose of Adenosine (mg):  n/a Dose of Lexiscan: 0.4 mg  Dose of Atropine (mg): n/a Dose of Dobutamine: n/a mcg/kg/min (at max HR)  Stress Test Technologist: Mellody Memos, CCT Nuclear Technologist: Imagene Riches, CNMT   Rest Procedure:  Myocardial perfusion imaging was performed at rest 45 minutes following the intravenous administration of Technetium 44m Sestamibi. Stress Procedure:  The patient received IV Lexiscan 0.4 mg over 15-seconds.  Technetium 50m Sestamibi injected IV at 30-seconds.  There were no significant changes with Lexiscan.  Quantitative spect images were obtained after a 45 minute delay.  Transient Ischemic Dilatation (Normal <1.22):  1.05 QGS EDV:  209 ml QGS ESV:  139 ml LV Ejection  Fraction: 33%  Rest ECG: NSR-RBBB  Stress ECG: There are scattered PVCs.  QPS Raw Data Images:  Normal; no motion artifact; normal heart/lung ratio. Stress Images:  Inferior and mid to distal anterior defects Rest Images:  Inferiori perfusion defect Subtraction (SDS):  4  Impression Exercise Capacity:  Lexiscan with no exercise. BP Response:  Hypotensive blood pressure response. Clinical Symptoms:  There is dyspnea. ECG Impression:  No significant ECG changes with Lexiscan. Comparison with Prior Nuclear Study: No previous nuclear study performed  Overall Impression:  High risk stress nuclear study with large, severe intensity fixed inferior defect suggestive of scar. There is also a small-sized, moderate intensity reversible perfusion defect of the mid to distal anterior wall.  LV Wall Motion:  Global hypokinesis wtih inferior akinesis - LVEF 33%  Pixie Casino, MD, [place] Certified in Nuclear Cardiology Attending Cardiologist [place], MD  08/05/2014 4:50 PM

## 2014-08-06 ENCOUNTER — Other Ambulatory Visit: Payer: Self-pay | Admitting: Family Medicine

## 2014-08-06 NOTE — Telephone Encounter (Signed)
Received message for [facility] pharmacist stating they received Lovaza Rx but is was only for a 15 day supply (#30).  Other prescriptions received were for 90 day supply.  Please advise.  Derl Barrow, RN

## 2014-08-07 NOTE — Telephone Encounter (Signed)
Verbal order given to Pharmacist at [facility] change quantity for one month supply per Dr. Mingo Amber.  Pt to follow up with PCP.  Derl Barrow, RN

## 2014-08-08 ENCOUNTER — Other Ambulatory Visit: Payer: Self-pay | Admitting: Family Medicine

## 2014-08-08 ENCOUNTER — Other Ambulatory Visit: Payer: Self-pay | Admitting: *Deleted

## 2014-08-08 DIAGNOSIS — I259 Chronic ischemic heart disease, unspecified: Secondary | ICD-10-CM

## 2014-08-08 DIAGNOSIS — Z79899 Other long term (current) drug therapy: Secondary | ICD-10-CM

## 2014-08-08 DIAGNOSIS — R0600 Dyspnea, unspecified: Secondary | ICD-10-CM

## 2014-08-08 DIAGNOSIS — D689 Coagulation defect, unspecified: Secondary | ICD-10-CM

## 2014-08-11 ENCOUNTER — Telehealth: Payer: Self-pay | Admitting: Cardiology

## 2014-08-11 NOTE — Telephone Encounter (Signed)
Called and spoke with patient regarding left heart cath that was ordered by Dr. Percival Spanish.  Patient states that his sister is having health issues at the present time and he is helping her.  He will call when he is ready to schedule cath.

## 2014-08-12 ENCOUNTER — Encounter: Payer: Self-pay | Admitting: Family Medicine

## 2014-08-12 ENCOUNTER — Ambulatory Visit (INDEPENDENT_AMBULATORY_CARE_PROVIDER_SITE_OTHER): Payer: Commercial Managed Care - HMO | Admitting: Family Medicine

## 2014-08-12 VITALS — BP 155/92 | HR 61 | Temp 97.6°F | Wt 216.0 lb

## 2014-08-12 DIAGNOSIS — R7989 Other specified abnormal findings of blood chemistry: Secondary | ICD-10-CM

## 2014-08-12 DIAGNOSIS — I10 Essential (primary) hypertension: Secondary | ICD-10-CM | POA: Diagnosis not present

## 2014-08-12 DIAGNOSIS — R778 Other specified abnormalities of plasma proteins: Secondary | ICD-10-CM

## 2014-08-12 DIAGNOSIS — E785 Hyperlipidemia, unspecified: Secondary | ICD-10-CM

## 2014-08-12 DIAGNOSIS — R600 Localized edema: Secondary | ICD-10-CM | POA: Diagnosis not present

## 2014-08-12 DIAGNOSIS — I509 Heart failure, unspecified: Secondary | ICD-10-CM

## 2014-08-12 LAB — LIPID PANEL
CHOLESTEROL: 78 mg/dL (ref 0–200)
HDL: 32 mg/dL — ABNORMAL LOW (ref >=40)
LDL Cholesterol: 32 mg/dL (ref 0–99)
TRIGLYCERIDES: 70 mg/dL (ref <150)
Total CHOL/HDL Ratio: 2.4 Ratio
VLDL: 14 mg/dL (ref 0–40)

## 2014-08-12 LAB — BASIC METABOLIC PANEL
BUN: 17 mg/dL (ref 6–23)
CALCIUM: 8.4 mg/dL (ref 8.4–10.5)
CO2: 33 mEq/L — ABNORMAL HIGH (ref 19–32)
Chloride: 103 mEq/L (ref 96–112)
Creat: 1.03 mg/dL (ref 0.50–1.35)
Glucose, Bld: 107 mg/dL — ABNORMAL HIGH (ref 70–99)
Potassium: 3 mEq/L — ABNORMAL LOW (ref 3.5–5.3)
Sodium: 145 mEq/L (ref 135–145)

## 2014-08-12 MED ORDER — FUROSEMIDE 40 MG PO TABS: 40.0000 mg | ORAL_TABLET | Freq: Two times a day (BID) | ORAL | Status: DC

## 2014-08-12 MED ORDER — FUROSEMIDE 20 MG PO TABS: 20.0000 mg | ORAL_TABLET | Freq: Three times a day (TID) | ORAL | Status: DC

## 2014-08-12 NOTE — Progress Notes (Signed)
Subjective:     Patient ID: [name], male   DOB: 1951-04-01, 64 y.o.   MRN: [id number]  HPI  HTN/HLD:Patient is compliant with his medications as listed on record, here for follow up. Elevated Troponin:Patient had elevated TNI during last hospital, he was scheduled for stress test but canceled it since he had to take care of his sister. He denies any chest pain. LL swelling: Patient c/o worsening leg swelling over the last 2 wks despite Lasix 40 mg BID, he is upset about this. CHF: Patient is currently on Lasix 40 mg BID, he also stated his Cardiologist added Coreg 6.25 mg BID to his Coreg 12.5mg  BID to make a total of 18.75 mg BID, he has been doing well with his medication but still having occasional SOB with exertion.  Current Outpatient Prescriptions on File Prior to Visit  Medication Sig Dispense Refill  . Ascorbic Acid (VITAMIN C) 1000 MG tablet Take 2,000 mg by mouth 2 (two) times daily.    Marland Kitchen aspirin EC 81 MG EC tablet Take 1 tablet (81 mg total) by mouth daily. 30 tablet 0  . BIOFLAVONOIDS PO Take 1 tablet by mouth 2 (two) times daily.    . carvedilol (COREG) 12.5 MG tablet Take 1 tablet (12.5 mg total) by mouth 2 (two) times daily with a meal. 60 tablet 1  . carvedilol (COREG) 6.25 MG tablet Take 1 tablet (6.25 mg total) by mouth 2 (two) times daily. 180 tablet 3  . finasteride (PROSCAR) 5 MG tablet Take 5 mg by mouth daily.    . fluticasone (FLONASE) 50 MCG/ACT nasal spray Place 2 sprays into both nostrils daily. 16 g 6  . ibuprofen (ADVIL,MOTRIN) 800 MG tablet Take 800 mg by mouth every 12 (twelve) hours as needed for headache or moderate pain.    Marland Kitchen losartan (COZAAR) 50 MG tablet TAKE 1 TABLET BY MOUTH EVERY DAY 30 tablet 0  . lovastatin (MEVACOR) 20 MG tablet TAKE 1 TABLET BY MOUTH AT BEDTIME 90 tablet 0  . Multiple Vitamins-Minerals (MULTIVITAMIN WITH MINERALS) tablet Take 1 tablet by mouth daily.    Marland Kitchen omega-3 acid ethyl esters (LOVAZA) 1 G capsule Take 2 g by mouth 2 (two) times  daily.    . potassium chloride SA (K-DUR,KLOR-CON) 20 MEQ tablet TAKE 1 TABLET BY MOUTH TWICE A WEEK 25 tablet 1  . tamsulosin (FLOMAX) 0.4 MG CAPS capsule Take 0.4 mg by mouth daily.     . traMADol (ULTRAM) 50 MG tablet TAKE 1 TABLET BY MOUTH EVERY 8 HOURS AS NEEDED 90 tablet 2  . traZODone (DESYREL) 150 MG tablet TAKE 1 TABLET BY MOUTH EVERY NIGHT AT BEDTIME 90 tablet 1  . senna (SENOKOT) 8.6 MG TABS tablet Take 1 tablet (8.6 mg total) by mouth daily as needed for mild constipation. 30 each 0   No current facility-administered medications on file prior to visit.   Past Medical History  Diagnosis Date  . CHF (congestive heart failure)     Preserved EF  . Hypertension   . Hyperlipidemia   . Enlarged prostate   . Pneumonia 05/2014  . DM II (diabetes mellitus, type II), controlled     Diet controlled.   . Degenerative joint disease of knee, right   . Arthritis     "right hand; right knee" (06/19/2014)      Review of Systems  Respiratory: Positive for shortness of breath. Negative for cough and wheezing.   Cardiovascular: Positive for leg swelling. Negative for chest  pain.  Gastrointestinal: Negative.   Genitourinary: Negative.   All other systems reviewed and are negative.  Filed Vitals:   08/12/14 1147  BP: 155/92  Pulse: 61  Temp: 97.6 F (36.4 C)  Weight: 216 lb (97.977 kg)       Objective:   Physical Exam  Constitutional: He appears well-developed. No distress.  Cardiovascular: Normal rate, regular rhythm, normal heart sounds and intact distal pulses.   No murmur heard. Pulmonary/Chest: Effort normal and breath sounds normal. No respiratory distress. He has no wheezes.    Abdominal: Soft. Bowel sounds are normal. He exhibits no distension and no mass. There is no tenderness.  Musculoskeletal: He exhibits edema.  B/L lower limbs pitting edema +++ extending from few inches below the knees down to the ankles and feet.  Nursing note and vitals reviewed.        Assessment:     HTN HLD: Elevated Troponin: Leg feet swelling:  CHF:    Plan:     Check problem list.

## 2014-08-12 NOTE — Patient Instructions (Addendum)
It was nice seeing you today, I am sorry about your leg swelling, it is likely related to your congestive heart failure. Please increase lasix from 40 mg BID to 40 mg in morning 20mg  in noon and 40mg  at night for 2 wks. If BP is dropping less than 100/70 please return back to 40 mg BID. I will see you back in 2-4 wks. Please call your cardiologist to schedule stress test.   Take 1/2 of the 40 mg tablet to make it 20 mg tablet.

## 2014-08-13 ENCOUNTER — Telehealth: Payer: Self-pay | Admitting: Family Medicine

## 2014-08-13 DIAGNOSIS — R600 Localized edema: Secondary | ICD-10-CM | POA: Insufficient documentation

## 2014-08-13 NOTE — Assessment & Plan Note (Signed)
Stable on Lovastatin. FLP checked today.

## 2014-08-13 NOTE — Assessment & Plan Note (Signed)
Hx of elevated TNI during hospital admission. Currently asymptomatic. He is advised to reschedule stress test with his cardiologist. He will do this as soon as he can.

## 2014-08-13 NOTE — Assessment & Plan Note (Signed)
Some fluid retention with leg swelling and Bi-basal crackles. Dose of Lasix increased. Continue Coreg as instructed. F/U in 2 wks for reassessment or sooner if symptom worsens.

## 2014-08-13 NOTE — Assessment & Plan Note (Addendum)
BP is slightly elevated today. Lasix was increased to 100mg  qd for 1-2 wks hence his other antihypertensive medication was not adjusted. Consider increasing Coreg back to 25 mg BID provided his HR is not too low. Bmet checked today. Continue home BP check.

## 2014-08-13 NOTE — Assessment & Plan Note (Signed)
Likely related to his CHF. Plan to increase Lasix to 100 mg daily for the next 1-2 wks. Return to 80 mg daily afterward. F/U in 2 wks for reassessment. Conservative measures: Leg elevation and wear compression stockings. Patient agreed with plan.

## 2014-08-13 NOTE — Telephone Encounter (Signed)
K+, he is advised to increase Kdur to 20 meq/l three times weekly from 2 times weekly.He agreed with plan.

## 2014-08-15 ENCOUNTER — Telehealth: Payer: Self-pay | Admitting: Family Medicine

## 2014-08-15 NOTE — Telephone Encounter (Signed)
Asking to speak with MD, says the extra pill he was instructed to take is not helping, he is still gaining weight, From Wed-Thu this week he gained 2 lbs, from yesterday - today he gained another 1.4, pt says he has gained 11 lbs since 07/30/14.

## 2014-08-15 NOTE — Telephone Encounter (Signed)
I spoke with patient, despite going up on Lasix to 40 mg am a night with 20 mg noon, his leg swelling is worsening. He stated with exertion she gets short winded,I recommended ER visit but he declined, he stated he has to take care of his sister. As discussed with him he may take Lasix 40mg  TID instead if BP is more than 120/70 He is advised to go to the ED for IV lasix if this does not help still. He agreed with plan.

## 2014-08-18 ENCOUNTER — Telehealth: Payer: Self-pay | Admitting: Family Medicine

## 2014-08-18 NOTE — Telephone Encounter (Signed)
Advised pt as directed below and stated that he tried doing 40mg  TID on Saturday and their was no change with weight. He stated that yesterday he did 80mg  in AM and 40mg  at night and that it helped him to lose some weight and go to the bathroom at least 5 times. He would like to try doing this as taking 40mg  TID did not help. I advised him I would inform you of what he said but that the advice may still be the same as stated before. Please advise. Gerrick Ray, CMA.

## 2014-08-18 NOTE — Telephone Encounter (Signed)
I am concern about his BP, 80 mg in morning and 40 mg in PM is equivalent to 40 mg TID.   Please have him continue 40 mg TID till next appointment when we can check his Kidney function. Also advise to hold medication if BP is dropping below 110/70.

## 2014-08-18 NOTE — Telephone Encounter (Signed)
Tried additional 40mg  of lasix, still gaining weight, pt wanted PCP to know that he took 80mg  in the morning and 40mg  at night and it seemed to work / wants to know if PCP can consent to take 80mg  in morning and 80mg  at night. Said that the 28 wasn't working at all / [employer], ASA

## 2014-08-19 ENCOUNTER — Telehealth: Payer: Self-pay | Admitting: *Deleted

## 2014-08-19 NOTE — Telephone Encounter (Signed)
spoke with patient today to follow up about cath that has yet to be scheduled. Patient stated that his sister is in need of his care at this time and he is afraid that he may have to stay over not and she will be "helpless" . He is unsure when he will be able to schedule the cardiac cath. Note will be forwarded to Dr.Hochrein to determine plan from this point.

## 2014-08-19 NOTE — Telephone Encounter (Signed)
Advised pt as directed below and verbalized understanding. Adisen Bennion, CMA. 

## 2014-08-19 NOTE — Telephone Encounter (Signed)
How often would you like for him to take 80mg  intermittently (like every other day, every 2 days or even once a week etc)? Ziquan Fidel, CMA.

## 2014-08-19 NOTE — Telephone Encounter (Signed)
1-2 times a week. Please set him up to see me next week. Thanks.

## 2014-08-19 NOTE — Telephone Encounter (Signed)
Please call the patient again next week and document when he might be able to have a cath.

## 2014-08-19 NOTE — Telephone Encounter (Signed)
Please inform patient, I [name]'t really want to change his meds till I see him, he can use 80 mf intermittently. I will like to see him next week to check his Kidney status.

## 2014-08-26 ENCOUNTER — Telehealth: Payer: Self-pay | Admitting: *Deleted

## 2014-08-26 NOTE — Telephone Encounter (Signed)
Spoke with patient today to discuss scheduling his cath. Patient stated that it is going to be "at least a few months" before he can do this. He states that his sister has a terrible pressure ulcer on her foot and is unable to take care of herself so he does not want to schedule the cath until her ulcer is healed. I reiterated the risk and the results of the stress test, patient voiced understanding but states he will call when he feels he is able.

## 2014-08-29 ENCOUNTER — Ambulatory Visit (INDEPENDENT_AMBULATORY_CARE_PROVIDER_SITE_OTHER): Payer: Commercial Managed Care - HMO | Admitting: Family Medicine

## 2014-08-29 ENCOUNTER — Encounter: Payer: Self-pay | Admitting: Family Medicine

## 2014-08-29 VITALS — BP 148/91 | HR 59 | Temp 97.6°F | Ht 69.0 in | Wt 216.2 lb

## 2014-08-29 DIAGNOSIS — M25561 Pain in right knee: Secondary | ICD-10-CM

## 2014-08-29 DIAGNOSIS — I10 Essential (primary) hypertension: Secondary | ICD-10-CM

## 2014-08-29 DIAGNOSIS — M7989 Other specified soft tissue disorders: Secondary | ICD-10-CM | POA: Diagnosis not present

## 2014-08-29 DIAGNOSIS — E876 Hypokalemia: Secondary | ICD-10-CM | POA: Insufficient documentation

## 2014-08-29 DIAGNOSIS — F32A Depression, unspecified: Secondary | ICD-10-CM

## 2014-08-29 DIAGNOSIS — I509 Heart failure, unspecified: Secondary | ICD-10-CM

## 2014-08-29 DIAGNOSIS — F329 Major depressive disorder, single episode, unspecified: Secondary | ICD-10-CM

## 2014-08-29 DIAGNOSIS — M25562 Pain in left knee: Secondary | ICD-10-CM

## 2014-08-29 MED ORDER — TRAZODONE HCL 100 MG PO TABS: 100.0000 mg | ORAL_TABLET | Freq: Every evening | ORAL | Status: DC | PRN

## 2014-08-29 NOTE — Assessment & Plan Note (Signed)
As discussed with patient this is likely due t his CHF with worsening EF. Improved a little of lasix 40 mg TID. Concern about kidney function and loss of K+ with higer dose. I rechecked his BMET again today. If kidney function is ok we can go up on Lasix or switch to torsemide to see if this will be more potent for him. I will call him with test result.

## 2014-08-29 NOTE — Progress Notes (Signed)
Subjective:     Patient ID: [name], male   DOB: 05/13/1951, 64 y.o.   MRN: [id number]  HPI  CHF/Leg swelling:Patient is now on Lasix 40mg  TID which helps some with his leg swelling, he is still having SOB. He got his myo perfusion scan done recently, he mentioned result was discussed with him and he need to have cardiac cath but he is not ready to schedule it since he need to take care of his sister.He denies any chest pain. HTN:He is compliant with his medications, his BP has been improving since he went up on his Lasix. Depression:Patient feels depressed with his current situation,he is however not suicidal. Hypokalemia: here for follow up, he is now on K+ 3 times weekly.  Current Outpatient Prescriptions on File Prior to Visit  Medication Sig Dispense Refill  . Ascorbic Acid (VITAMIN C) 1000 MG tablet Take 2,000 mg by mouth 2 (two) times daily.    Marland Kitchen aspirin EC 81 MG EC tablet Take 1 tablet (81 mg total) by mouth daily. 30 tablet 0  . BIOFLAVONOIDS PO Take 1 tablet by mouth 2 (two) times daily.    . carvedilol (COREG) 12.5 MG tablet Take 1 tablet (12.5 mg total) by mouth 2 (two) times daily with a meal. 60 tablet 1  . carvedilol (COREG) 6.25 MG tablet Take 1 tablet (6.25 mg total) by mouth 2 (two) times daily. 180 tablet 3  . finasteride (PROSCAR) 5 MG tablet Take 5 mg by mouth daily.    . fluticasone (FLONASE) 50 MCG/ACT nasal spray Place 2 sprays into both nostrils daily. 16 g 6  . furosemide (LASIX) 40 MG tablet Take 1 tablet (40 mg total) by mouth 2 (two) times daily. 60 tablet 1  . ibuprofen (ADVIL,MOTRIN) 800 MG tablet Take 800 mg by mouth every 12 (twelve) hours as needed for headache or moderate pain.    Marland Kitchen losartan (COZAAR) 50 MG tablet TAKE 1 TABLET BY MOUTH EVERY DAY 30 tablet 0  . lovastatin (MEVACOR) 20 MG tablet TAKE 1 TABLET BY MOUTH AT BEDTIME 90 tablet 0  . Multiple Vitamins-Minerals (MULTIVITAMIN WITH MINERALS) tablet Take 1 tablet by mouth daily.    Marland Kitchen omega-3 acid ethyl  esters (LOVAZA) 1 G capsule Take 2 g by mouth 2 (two) times daily.    . potassium chloride SA (K-DUR,KLOR-CON) 20 MEQ tablet TAKE 1 TABLET BY MOUTH TWICE A WEEK 25 tablet 1  . senna (SENOKOT) 8.6 MG TABS tablet Take 1 tablet (8.6 mg total) by mouth daily as needed for mild constipation. 30 each 0  . tamsulosin (FLOMAX) 0.4 MG CAPS capsule Take 0.4 mg by mouth daily.     . traMADol (ULTRAM) 50 MG tablet TAKE 1 TABLET BY MOUTH EVERY 8 HOURS AS NEEDED 90 tablet 2  . traZODone (DESYREL) 150 MG tablet TAKE 1 TABLET BY MOUTH EVERY NIGHT AT BEDTIME 90 tablet 1   No current facility-administered medications on file prior to visit.   Past Medical History  Diagnosis Date  . CHF (congestive heart failure)     Preserved EF  . Hypertension   . Hyperlipidemia   . Enlarged prostate   . Pneumonia 05/2014  . DM II (diabetes mellitus, type II), controlled     Diet controlled.   . Degenerative joint disease of knee, right   . Arthritis     "right hand; right knee" (06/19/2014)      Review of Systems  Constitutional: Negative for fever.  Respiratory: Positive for  shortness of breath. Negative for cough and chest tightness.   Cardiovascular: Positive for leg swelling. Negative for chest pain and palpitations.  Gastrointestinal: Negative.   Genitourinary: Negative.   Musculoskeletal: Negative.   All other systems reviewed and are negative.      Objective:   Physical Exam  Constitutional: He is oriented to person, place, and time. He appears well-developed. No distress.  Cardiovascular: Normal rate, regular rhythm and normal heart sounds.   No murmur heard. Pulmonary/Chest: Effort normal and breath sounds normal. No respiratory distress. He has no wheezes.  Abdominal: Soft. Bowel sounds are normal. He exhibits no distension and no mass. There is no tenderness.  Musculoskeletal: He exhibits edema.       Feet:  Neurological: He is alert and oriented to person, place, and time.  Psychiatric: His  speech is normal and behavior is normal. Thought content normal. His mood appears not anxious. His affect is not angry. Cognition and memory are normal. He exhibits a depressed mood.  Nursing note and vitals reviewed.      Assessment:     CHF/Leg swelling: HTN: Depression: Hypokalemia     Plan:     Check problem list.

## 2014-08-29 NOTE — Assessment & Plan Note (Signed)
Check Bmet.

## 2014-08-29 NOTE — Assessment & Plan Note (Signed)
Although his previous ECHO shows EF of 60% with grade 1 diastolic dysfunction. I reviewed his Myo perfusion test, it shows EF of 33%. It could be that he had a declined in his LVEF over the last few months. I counseled him on the need for cardiac cath per cardiologist recommendation. He agreed to call for appointment with the cardiologist. Continue Coreg, ASA and Lasix for now.

## 2014-08-29 NOTE — Patient Instructions (Signed)
It was nice seeing you, I am glad you leg swelling improved. I will like to check your lab today with that I can adjust your water pill. Also follow up with cardiologist as we discussed. I will like to adjust your Trazodone to help with your depression, also schedule appointment for counseling and to see a psychiatrist at [facility] as soon as possible, call if you have any question.

## 2014-08-29 NOTE — Assessment & Plan Note (Signed)
No acute change. Likely due to stress at home, taking care of his sister at home. I recommended counseling and to increase the dose of his Trazodone. He will contact [facility] to schedule an appointment. I increased his Trazodone from 150 mg qhs to 100 mg BID. I will reassess him at next visit.

## 2014-08-30 LAB — BASIC METABOLIC PANEL
BUN: 18 mg/dL (ref 6–23)
CO2: 30 meq/L (ref 19–32)
CREATININE: 1.24 mg/dL (ref 0.50–1.35)
Calcium: 8.5 mg/dL (ref 8.4–10.5)
Chloride: 102 mEq/L (ref 96–112)
GLUCOSE: 109 mg/dL — AB (ref 70–99)
Potassium: 3 mEq/L — ABNORMAL LOW (ref 3.5–5.3)
Sodium: 144 mEq/L (ref 135–145)

## 2014-09-01 ENCOUNTER — Telehealth: Payer: Self-pay | Admitting: Family Medicine

## 2014-09-01 MED ORDER — POTASSIUM CHLORIDE CRYS ER 20 MEQ PO TBCR: 20.0000 meq | EXTENDED_RELEASE_TABLET | Freq: Every day | ORAL | Status: DC

## 2014-09-01 MED ORDER — FUROSEMIDE 40 MG PO TABS: ORAL_TABLET | ORAL | Status: DC

## 2014-09-01 NOTE — Telephone Encounter (Signed)
I called to discuss result with patient, his potassium is still low despite Kdur 20 mEq/L 3 times a week. This is likely due to his higher dose of Lasix. I recommended he takes his potassium once daily.  Also his Kidney function bumped a little bit still within range. He has been taking Lasix 40 mg TID which helped some with his swelling. He stated taking Lasix 80 mg in AM and 40 mg in the PM helps better with his edema. As discussed with him today, he may do 80 mg in AM and 40mg  in pm of lasix only if is his BP is over 100/70.  He will need a recheck of his kidney function soon during next visit.  I again discussed the need for him to follow up with cardiologist for his CHF with recent drop in EF to about 30%. He stated he will consider calling their office for follow up this week.

## 2014-09-03 ENCOUNTER — Telehealth: Payer: Self-pay | Admitting: Family Medicine

## 2014-09-03 ENCOUNTER — Other Ambulatory Visit: Payer: Self-pay | Admitting: Family Medicine

## 2014-09-03 MED ORDER — TRAZODONE HCL 100 MG PO TABS: 100.0000 mg | ORAL_TABLET | Freq: Two times a day (BID) | ORAL | Status: DC

## 2014-09-03 NOTE — Telephone Encounter (Signed)
Will forward to MD.  In office note it states that medication dose will be decreased but amount of time will be increased to twice a day.  Looked at medication prescription and it was decreased but instructions for at bedtime stayed the same.  Please advise. Jazmin Hartsell,CMA

## 2014-09-03 NOTE — Telephone Encounter (Signed)
Medication clarification. He should be on Trazodone 100 mg BID. I called pharmacy for clarification and also informed patient of this change.

## 2014-09-03 NOTE — Telephone Encounter (Signed)
Had fu on Friday discussed depression and sleeplessness taking 150 mg of trazodone and noticed that Dr. Gwendlyn Deutscher decreased dosage when he picked up his Rx. He says he is a bit confused, he thought she was going to increase the dosage. Please call pt with clarification when possible / thanks [employer], ASA

## 2014-09-08 ENCOUNTER — Other Ambulatory Visit: Payer: Self-pay | Admitting: Family Medicine

## 2014-09-09 ENCOUNTER — Other Ambulatory Visit: Payer: Self-pay | Admitting: Family Medicine

## 2014-09-15 ENCOUNTER — Other Ambulatory Visit: Payer: Self-pay | Admitting: Family Medicine

## 2014-09-16 ENCOUNTER — Telehealth: Payer: Self-pay | Admitting: Family Medicine

## 2014-09-16 NOTE — Telephone Encounter (Signed)
I spoke with patient about his hand pain. He may take 100mg  tramadol 1-2 times per day not to exceed 400mg  per day only if not doing well with Tramadol 50 mg prn. I encouraged him to see me soon for reassessment.

## 2014-09-16 NOTE — Telephone Encounter (Signed)
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 He is taking Tramadol but that is not helping anymore and also he stopped taking the ibuprofen because he is retaining a lot of fluid. He wanted to know if something can be called in or if there is something over the counter he can take. Please call to discuss. jw

## 2014-10-03 ENCOUNTER — Encounter: Payer: Self-pay | Admitting: Family Medicine

## 2014-10-03 ENCOUNTER — Ambulatory Visit (INDEPENDENT_AMBULATORY_CARE_PROVIDER_SITE_OTHER): Payer: Commercial Managed Care - HMO | Admitting: Family Medicine

## 2014-10-03 VITALS — BP 126/71 | HR 59 | Temp 97.5°F | Ht 69.0 in | Wt 204.0 lb

## 2014-10-03 DIAGNOSIS — M79644 Pain in right finger(s): Secondary | ICD-10-CM

## 2014-10-03 DIAGNOSIS — R7309 Other abnormal glucose: Secondary | ICD-10-CM

## 2014-10-03 DIAGNOSIS — I10 Essential (primary) hypertension: Secondary | ICD-10-CM | POA: Diagnosis not present

## 2014-10-03 DIAGNOSIS — I509 Heart failure, unspecified: Secondary | ICD-10-CM

## 2014-10-03 DIAGNOSIS — E119 Type 2 diabetes mellitus without complications: Secondary | ICD-10-CM | POA: Diagnosis not present

## 2014-10-03 DIAGNOSIS — M25551 Pain in right hip: Secondary | ICD-10-CM

## 2014-10-03 DIAGNOSIS — R7303 Prediabetes: Secondary | ICD-10-CM

## 2014-10-03 HISTORY — DX: Prediabetes: R73.03

## 2014-10-03 LAB — POCT GLYCOSYLATED HEMOGLOBIN (HGB A1C): Hemoglobin A1C: 5

## 2014-10-03 NOTE — Assessment & Plan Note (Signed)
BP looks good today. Continue current regimen. Discuss Coreg with cardiologist.

## 2014-10-03 NOTE — Patient Instructions (Signed)
Hip Exercises RANGE OF MOTION (ROM) AND STRETCHING EXERCISES  These exercises may help you when beginning to rehabilitate your injury. Doing them too aggressively can worsen your condition. Complete them slowly and gently. Your symptoms may resolve with or without further involvement from your physician, physical therapist or athletic trainer. While completing these exercises, remember:   Restoring tissue flexibility helps normal motion to return to the joints. This allows healthier, less painful movement and activity.  An effective stretch should be held for at least 30 seconds.  A stretch should never be painful. You should only feel a gentle lengthening or release in the stretched tissue. If these stretches worsen your symptoms even when done gently, consult your physician, physical therapist or athletic trainer. STRETCH - Hamstrings, Supine   Lie on your back. Loop a belt or towel over the ball of your right / left foot.  Straighten your right / left knee and slowly pull on the belt to raise your leg. Do not allow the right / left knee to bend. Keep your opposite leg flat on the floor.  Raise the leg until you feel a gentle stretch behind your right / left knee or thigh. Hold this position for __________ seconds. Repeat __________ times. Complete this stretch __________ times per day.  STRETCH - Hip Rotators   Lie on your back on a firm surface. Grasp your right / left knee with your right / left hand and your ankle with your opposite hand.  Keeping your hips and shoulders firmly planted, gently pull your right / left knee and rotate your lower leg toward your opposite shoulder until you feel a stretch in your buttocks.  Hold this stretch for __________ seconds. Repeat this stretch __________ times. Complete this stretch __________ times per day. STRETCH - Hamstrings/Adductors, V-Sit   Sit on the floor with your legs extended in a large "V," keeping your knees straight.  With your  head and chest upright, bend at your waist reaching for your right foot to stretch your left adductors.  You should feel a stretch in your left inner thigh. Hold for __________ seconds.  Return to the upright position to relax your leg muscles.  Continuing to keep your chest upright, bend straight forward at your waist to stretch your hamstrings.  You should feel a stretch behind both of your thighs and/or knees. Hold for __________ seconds.  Return to the upright position to relax your leg muscles.  Repeat steps 2 through 4 for opposite leg. Repeat __________ times. Complete this exercise __________ times per day.  STRETCHING - Hip Flexors, Lunge  Half kneel with your right / left knee on the floor and your opposite knee bent and directly over your ankle.  Keep good posture with your head over your shoulders. Tighten your buttocks to point your tailbone downward; this will prevent your back from arching too much.  You should feel a gentle stretch in the front of your thigh and/or hip. If you do not feel any resistance, slightly slide your opposite foot forward and then slowly lunge forward so your knee once again lines up over your ankle. Be sure your tailbone remains pointed downward.  Hold this stretch for __________ seconds. Repeat __________ times. Complete this stretch __________ times per day. STRENGTHENING EXERCISES These exercises may help you when beginning to rehabilitate your injury. They may resolve your symptoms with or without further involvement from your physician, physical therapist or athletic trainer. While completing these exercises, remember:   Muscles  can gain both the endurance and the strength needed for everyday activities through controlled exercises.  Complete these exercises as instructed by your physician, physical therapist or athletic trainer. Progress the resistance and repetitions only as guided.  You may experience muscle soreness or fatigue, but the  pain or discomfort you are trying to eliminate should never worsen during these exercises. If this pain does worsen, stop and make certain you are following the directions exactly. If the pain is still present after adjustments, discontinue the exercise until you can discuss the trouble with your clinician. STRENGTH - Hip Extensors, Bridge   Lie on your back on a firm surface. Bend your knees and place your feet flat on the floor.  Tighten your buttocks muscles and lift your bottom off the floor until your trunk is level with your thighs. You should feel the muscles in your buttocks and back of your thighs working. If you do not feel these muscles, slide your feet 1-2 inches further away from your buttocks.  Hold this position for __________ seconds.  Slowly lower your hips to the starting position and allow your buttock muscles relax completely before beginning the next repetition.  If this exercise is too easy, you may cross your arms over your chest. Repeat __________ times. Complete this exercise __________ times per day.  STRENGTH - Hip Abductors, Straight Leg Raises  Be aware of your form throughout the entire exercise so that you exercise the correct muscles. Sloppy form means that you are not strengthening the correct muscles.  Lie on your side so that your head, shoulders, knee and hip line up. You may bend your lower knee to help maintain your balance. Your right / left leg should be on top.  Roll your hips slightly forward, so that your hips are stacked directly over each other and your right / left knee is facing forward.  Lift your top leg up 4-6 inches, leading with your heel. Be sure that your foot does not drift forward or that your knee does not roll toward the ceiling.  Hold this position for __________ seconds. You should feel the muscles in your outer hip lifting (you may not notice this until your leg begins to tire).  Slowly lower your leg to the starting position. Allow  the muscles to fully relax before beginning the next repetition. Repeat __________ times. Complete this exercise __________ times per day.  STRENGTH - Hip Adductors, Straight Leg Raises   Lie on your side so that your head, shoulders, knee and hip line up. You may place your upper foot in front to help maintain your balance. Your right / left leg should be on the bottom.  Roll your hips slightly forward, so that your hips are stacked directly over each other and your right / left knee is facing forward.  Tense the muscles in your inner thigh and lift your bottom leg 4-6 inches. Hold this position for __________ seconds.  Slowly lower your leg to the starting position. Allow the muscles to fully relax before beginning the next repetition. Repeat __________ times. Complete this exercise __________ times per day.  STRENGTH - Quadriceps, Straight Leg Raises  Quality counts! Watch for signs that the quadriceps muscle is working to insure you are strengthening the correct muscles and not "cheating" by substituting with healthier muscles.  Lay on your back with your right / left leg extended and your opposite knee bent.  Tense the muscles in the front of your right / left  thigh. You should see either your knee cap slide up or increased dimpling just above the knee. Your thigh may even quiver.  Tighten these muscles even more and raise your leg 4 to 6 inches off the floor. Hold for right / left seconds.  Keeping these muscles tense, lower your leg.  Relax the muscles slowly and completely in between each repetition. Repeat __________ times. Complete this exercise __________ times per day.  STRENGTH - Hip Abductors, Standing  Tie one end of a rubber exercise band/tubing to a secure surface (table, pole) and tie a loop at the other end.  Place the loop around your right / left ankle. Keeping your ankle with the band directly opposite of the secured end, step away until there is tension in the  tube/band.  Hold onto a chair as needed for balance.  Keeping your back upright, your shoulders over your hips, and your toes pointing forward, lift your right / left leg out to your side. Be sure to lift your leg with your hip muscles. Do not "throw" your leg or tip your body to lift your leg.  Slowly and with control, return to the starting position. Repeat exercise __________ times. Complete this exercise __________ times per day.  STRENGTH - Quadriceps, Squats  Stand in a door frame so that your feet and knees are in line with the frame.  Use your hands for balance, not support, on the frame.  Slowly lower your weight, bending at the hips and knees. Keep your lower legs upright so that they are parallel with the door frame. Squat only within the range that does not increase your knee pain. Never let your hips drop below your knees.  Slowly return upright, pushing with your legs, not pulling with your hands. Document Released: 06/03/2005 Document Revised: 08/08/2011 Document Reviewed: 08/28/2008 [facility] Patient Information 2015 [place], [place]. This information is not intended to replace advice given to you by your health care provider. Make sure you discuss any questions you have with your health care provider.

## 2014-10-03 NOTE — Assessment & Plan Note (Signed)
Mostly ring finger. Currently asymptomatic. ?? OA or RA. No hand deformity. Plan to continue Tramadol prn pain for now. Consider imaging at next visit with RF check if no improvement.

## 2014-10-03 NOTE — Assessment & Plan Note (Signed)
I doubt if he still has DM2 A1C has always been good. Today A1C is 5. Plan to check A1C yearly and at this time I will change his diagnosis to pre-diabetic.

## 2014-10-03 NOTE — Assessment & Plan Note (Signed)
Currently asymptomatic. Likely OA. Continue Tramadol prn pain. Home hip exercise discussed. If no improvement will consider imaging. F/U prn.

## 2014-10-03 NOTE — Assessment & Plan Note (Signed)
No acute symptoms. Patient advised to discuss Coreg regimen with cardiologist at his next appointment. Continue Lasix as instructed. Check Bmet at next visit.

## 2014-10-03 NOTE — Progress Notes (Signed)
Subjective:     Patient ID: Patrick [name], male   DOB: [birth year], 64 y.o.   MRN: [id number]  HPI  CHF: denies any SOB, no chest pain, his leg swelling has improved on his current dose of Lasix. Leg swelling worse at night only. He is scheduled to follow up with his cardiologist in about 2-3 wks. He continues to take Coreg 12.5 mg BID and 6.25 BID per cardiologist's instruction, he will review his Coreg regimen with his cardiologist. HTN: Home BP has been good, he denies any concern. DM2: He was diagnosed in 2001, had never been on medication, here for follow up. Finger pain: C/O right ring finger pain on and off for months,he can't completely bend finger joint. Tramadol help some but not a lot. Pain is about 2/10 to 7/10 in severity. Now pain is 2/10 in severity. Hip pain: right occasionally. No aggravating factor. Denies any pain currently. Tramadol helps.  Current Outpatient Prescriptions on File Prior to Visit  Medication Sig Dispense Refill  . aspirin EC 81 MG EC tablet Take 1 tablet (81 mg total) by mouth daily. 30 tablet 0  . BIOFLAVONOIDS PO Take 1 tablet by mouth 2 (two) times daily.    . carvedilol (COREG) 12.5 MG tablet TAKE 1 TABLET BY MOUTH TWICE DAILY WITH A MEAL 60 tablet 2  . finasteride (PROSCAR) 5 MG tablet Take 5 mg by mouth daily.    . furosemide (LASIX) 40 MG tablet Take 1-2 tablet in the morning and 1 tablet in the evening. Hold if BP is less than 110/70 90 tablet 2  . losartan (COZAAR) 50 MG tablet TAKE 1 TABLET BY MOUTH EVERY DAY 90 tablet 1  . lovastatin (MEVACOR) 20 MG tablet TAKE 1 TABLET BY MOUTH AT BEDTIME 90 tablet 0  . Multiple Vitamins-Minerals (MULTIVITAMIN WITH MINERALS) tablet Take 1 tablet by mouth daily.    Marland Kitchen omega-3 acid ethyl esters (LOVAZA) 1 G capsule TAKE 2 CAPSULES BY MOUTH TWICE DAILY 360 capsule 0  . potassium chloride SA (K-DUR,KLOR-CON) 20 MEQ tablet Take 1 tablet (20 mEq total) by mouth daily. 30 tablet 3  . senna (SENOKOT) 8.6 MG TABS tablet Take 1  tablet (8.6 mg total) by mouth daily as needed for mild constipation. 30 each 0  . tamsulosin (FLOMAX) 0.4 MG CAPS capsule Take 0.4 mg by mouth daily.     . traMADol (ULTRAM) 50 MG tablet TAKE 1 TABLET BY MOUTH EVERY 8 HOURS AS NEEDED 90 tablet 2  . traZODone (DESYREL) 100 MG tablet Take 1 tablet (100 mg total) by mouth 2 (two) times daily. 180 tablet 1  . Ascorbic Acid (VITAMIN C) 1000 MG tablet Take 2,000 mg by mouth 2 (two) times daily.    . fluticasone (FLONASE) 50 MCG/ACT nasal spray Place 2 sprays into both nostrils daily. (Patient not taking: Reported on 10/03/2014) 16 g 6  . ibuprofen (ADVIL,MOTRIN) 800 MG tablet Take 800 mg by mouth every 12 (twelve) hours as needed for headache or moderate pain.     No current facility-administered medications on file prior to visit.   Past Medical History  Diagnosis Date  . CHF (congestive heart failure)     Preserved EF  . Hypertension   . Hyperlipidemia   . Enlarged prostate   . Pneumonia 05/2014  . DM II (diabetes mellitus, type II), controlled     Diet controlled.   . Degenerative joint disease of knee, right   . Arthritis     "right hand;  right knee" (06/19/2014)    Review of Systems  Respiratory: Negative.   Cardiovascular: Negative.   Gastrointestinal: Negative.   Musculoskeletal: Positive for arthralgias.       Right hip pain and right ring finger pain.  All other systems reviewed and are negative.      Filed Vitals:   10/03/14 1135  BP: 126/71  Pulse: 59  Temp: 97.5 F (36.4 C)  TempSrc: Oral  Height: 5\' 9"  (1.753 m)  Weight: 204 lb (92.534 kg)    Objective:   Physical Exam  Constitutional: He appears well-developed. No distress.  Cardiovascular: Normal rate, regular rhythm, normal heart sounds and intact distal pulses.   No murmur heard. Pulmonary/Chest: Effort normal and breath sounds normal. No respiratory distress. He has no wheezes.  Abdominal: Soft. Bowel sounds are normal. He exhibits no distension and no  mass. There is no tenderness.  Musculoskeletal: Normal range of motion. He exhibits edema.       Right hip: Normal.       Left hip: Normal.       Right hand: Normal.       Left hand: Normal.  No hand or finger swelling or deformity.  Nursing note and vitals reviewed.      Assessment:     CHF:  HTN: DM2: Finger pain Hip pain    Plan:     Check problem list

## 2014-10-06 ENCOUNTER — Ambulatory Visit: Payer: Commercial Managed Care - HMO | Admitting: Podiatry

## 2014-10-07 ENCOUNTER — Other Ambulatory Visit: Payer: Self-pay | Admitting: Family Medicine

## 2014-10-09 ENCOUNTER — Other Ambulatory Visit: Payer: Self-pay | Admitting: *Deleted

## 2014-10-09 MED ORDER — CARVEDILOL 12.5 MG PO TABS: 18.7500 mg | ORAL_TABLET | Freq: Two times a day (BID) | ORAL | Status: DC

## 2014-10-09 NOTE — Telephone Encounter (Signed)
Med list was showing 12.5 mg bid but pt. Is taking a 12.5 mg and a 6.25 mg two times a day

## 2014-10-14 ENCOUNTER — Other Ambulatory Visit: Payer: Self-pay | Admitting: Family Medicine

## 2014-10-14 ENCOUNTER — Telehealth: Payer: Self-pay | Admitting: Family Medicine

## 2014-10-14 DIAGNOSIS — N400 Enlarged prostate without lower urinary tract symptoms: Secondary | ICD-10-CM

## 2014-10-14 NOTE — Telephone Encounter (Signed)
Referral order placed. Please inform patient.

## 2014-10-14 NOTE — Telephone Encounter (Signed)
Will forward to MD to place referral. Latisa Belay,CMA  

## 2014-10-14 NOTE — Telephone Encounter (Signed)
Patrick [name] is calling because he needs a new referral to see Dr. Louis Meckel at [facility]; address: [address], [place], [place] [address]; Phone: [phone]. Please call the patient once this is complete, so that he may reschedule his appt. Thank you, Fonda Kinder, ASA

## 2014-10-15 NOTE — Telephone Encounter (Signed)
[facility] Auth # [id number] 10/15/14 thru 04/13/15  Approved. Pt is aware. Will make his appt.

## 2014-10-16 ENCOUNTER — Ambulatory Visit: Payer: Commercial Managed Care - HMO | Admitting: Cardiology

## 2014-10-20 ENCOUNTER — Ambulatory Visit (INDEPENDENT_AMBULATORY_CARE_PROVIDER_SITE_OTHER): Payer: Commercial Managed Care - HMO | Admitting: Podiatry

## 2014-10-20 ENCOUNTER — Encounter: Payer: Self-pay | Admitting: Podiatry

## 2014-10-20 VITALS — BP 158/87 | HR 62 | Resp 16

## 2014-10-20 DIAGNOSIS — B351 Tinea unguium: Secondary | ICD-10-CM | POA: Diagnosis not present

## 2014-10-20 DIAGNOSIS — M79676 Pain in unspecified toe(s): Secondary | ICD-10-CM

## 2014-10-20 NOTE — Progress Notes (Signed)
Patient ID: Patrick [name], male   DOB: 14-May-1951, 64 y.o.   MRN: [id number]  Subjective: This patient presents again complaining of painful toenails and requesting nail debridement  Objective: Pitting edema ankles bilaterally The toenails are elongated, brittle, discolored, hypertrophic and tender to direct palpation 6-10  Assessment: Symptomatic onychomycoses 6-10  Plan: Debrided toenails 10 without a bleeding  Reappoint 3 months

## 2014-10-20 NOTE — Patient Instructions (Signed)
Diabetes and Foot Care Diabetes may cause you to have problems because of poor blood supply (circulation) to your feet and legs. This may cause the skin on your feet to become thinner, break easier, and heal more slowly. Your skin may become dry, and the skin may peel and crack. You may also have nerve damage in your legs and feet causing decreased feeling in them. You may not notice minor injuries to your feet that could lead to infections or more serious problems. Taking care of your feet is one of the most important things you can do for yourself.  HOME CARE INSTRUCTIONS  Wear shoes at all times, even in the house. Do not go barefoot. Bare feet are easily injured.  Check your feet daily for blisters, cuts, and redness. If you cannot see the bottom of your feet, use a mirror or ask someone for help.  Wash your feet with warm water (do not use hot water) and mild soap. Then pat your feet and the areas between your toes until they are completely dry. Do not soak your feet as this can dry your skin.  Apply a moisturizing lotion or petroleum jelly (that does not contain alcohol and is unscented) to the skin on your feet and to dry, brittle toenails. Do not apply lotion between your toes.  Trim your toenails straight across. Do not dig under them or around the cuticle. File the edges of your nails with an emery board or nail file.  Do not cut corns or calluses or try to remove them with medicine.  Wear clean socks or stockings every day. Make sure they are not too tight. Do not wear knee-high stockings since they may decrease blood flow to your legs.  Wear shoes that fit properly and have enough cushioning. To break in new shoes, wear them for just a few hours a day. This prevents you from injuring your feet. Always look in your shoes before you put them on to be sure there are no objects inside.  Do not cross your legs. This may decrease the blood flow to your feet.  If you find a minor scrape,  cut, or break in the skin on your feet, keep it and the skin around it clean and dry. These areas may be cleansed with mild soap and water. Do not cleanse the area with peroxide, alcohol, or iodine.  When you remove an adhesive bandage, be sure not to damage the skin around it.  If you have a wound, look at it several times a day to make sure it is healing.  Do not use heating pads or hot water bottles. They may burn your skin. If you have lost feeling in your feet or legs, you may not know it is happening until it is too late.  Make sure your health care provider performs a complete foot exam at least annually or more often if you have foot problems. Report any cuts, sores, or bruises to your health care provider immediately. SEEK MEDICAL CARE IF:   You have an injury that is not healing.  You have cuts or breaks in the skin.  You have an ingrown nail.  You notice redness on your legs or feet.  You feel burning or tingling in your legs or feet.  You have pain or cramps in your legs and feet.  Your legs or feet are numb.  Your feet always feel cold. SEEK IMMEDIATE MEDICAL CARE IF:   There is increasing redness,   swelling, or pain in or around a wound.  There is a red line that goes up your leg.  Pus is coming from a wound.  You develop a fever or as directed by your health care provider.  You notice a bad smell coming from an ulcer or wound. Document Released: 05/13/2000 Document Revised: 01/16/2013 Document Reviewed: 10/23/2012 [employer] Patient Information 2015 [employer]. This information is not intended to replace advice given to you by your health care provider. Make sure you discuss any questions you have with your health care provider.  

## 2014-10-24 ENCOUNTER — Encounter: Payer: Self-pay | Admitting: Cardiology

## 2014-10-24 ENCOUNTER — Ambulatory Visit (INDEPENDENT_AMBULATORY_CARE_PROVIDER_SITE_OTHER): Payer: Commercial Managed Care - HMO | Admitting: Cardiology

## 2014-10-24 VITALS — BP 142/83 | HR 74 | Ht 69.0 in | Wt 203.0 lb

## 2014-10-24 DIAGNOSIS — I5021 Acute systolic (congestive) heart failure: Secondary | ICD-10-CM

## 2014-10-24 DIAGNOSIS — I255 Ischemic cardiomyopathy: Secondary | ICD-10-CM | POA: Diagnosis not present

## 2014-10-24 MED ORDER — CARVEDILOL 25 MG PO TABS: 25.0000 mg | ORAL_TABLET | Freq: Two times a day (BID) | ORAL | Status: DC

## 2014-10-24 NOTE — Progress Notes (Signed)
HPI The patient presents for follow-up after a syncopal episode. He was hospitalized and we did see him in consultation. In 2009 he had a mildly reduced ejection fraction. However, at the time of his hospitalization in January of this year his EF was well-preserved. He did have some PVCs. He apparently had one triplet noted. There was one elevated troponin.  Because of the elevated troponin I did send him for a stress perfusion study. He had a large severe intensity fixed inferior defect suggestive of scar. There was some reversible ischemia possibly in the mid and distal anterior wall. He was to have cardiac catheterization.  However, he declined this because he is taking care of his sister. I do note that he saw his primary provider and is having some increased dyspnea. Today he is denying any symptoms. He says he has no shortness of breath, PND or orthopnea. He has no palpitations, presyncope. He's had no weight gain or edema.   No Known Allergies  Current Outpatient Prescriptions  Medication Sig Dispense Refill  . Ascorbic Acid (VITAMIN C) 1000 MG tablet Take 2,000 mg by mouth 2 (two) times daily.    Marland Kitchen aspirin EC 81 MG EC tablet Take 1 tablet (81 mg total) by mouth daily. 30 tablet 0  . BIOFLAVONOIDS PO Take 1 tablet by mouth 2 (two) times daily.    . carvedilol (COREG) 12.5 MG tablet Take 1.5 tablets (18.75 mg total) by mouth 2 (two) times daily with a meal. 180 tablet 6  . finasteride (PROSCAR) 5 MG tablet Take 5 mg by mouth daily.    . fluticasone (FLONASE) 50 MCG/ACT nasal spray Place 2 sprays into both nostrils daily. 16 g 6  . furosemide (LASIX) 40 MG tablet Take 1-2 tablet in the morning and 1 tablet in the evening. Hold if BP is less than 110/70 90 tablet 2  . ibuprofen (ADVIL,MOTRIN) 800 MG tablet Take 800 mg by mouth every 12 (twelve) hours as needed for headache or moderate pain.    Marland Kitchen losartan (COZAAR) 50 MG tablet TAKE 1 TABLET BY MOUTH EVERY DAY 90 tablet 1  . lovastatin  (MEVACOR) 20 MG tablet TAKE 1 TABLET BY MOUTH AT BEDTIME 90 tablet 0  . Multiple Vitamins-Minerals (MULTIVITAMIN WITH MINERALS) tablet Take 1 tablet by mouth daily.    Marland Kitchen omega-3 acid ethyl esters (LOVAZA) 1 G capsule TAKE 2 CAPSULES BY MOUTH TWICE DAILY 360 capsule 0  . potassium chloride SA (K-DUR,KLOR-CON) 20 MEQ tablet Take 1 tablet (20 mEq total) by mouth daily. 30 tablet 3  . senna (SENOKOT) 8.6 MG TABS tablet Take 1 tablet (8.6 mg total) by mouth daily as needed for mild constipation. 30 each 0  . tamsulosin (FLOMAX) 0.4 MG CAPS capsule Take 0.4 mg by mouth daily.     . traMADol (ULTRAM) 50 MG tablet TAKE 1 TABLET BY MOUTH EVERY 8 HOURS AS NEEDED 90 tablet 2  . traZODone (DESYREL) 100 MG tablet Take 1 tablet (100 mg total) by mouth 2 (two) times daily. 180 tablet 1   No current facility-administered medications for this visit.    Past Medical History  Diagnosis Date  . CHF (congestive heart failure)     Preserved EF  . Hypertension   . Hyperlipidemia   . Enlarged prostate   . Pneumonia 05/2014  . DM II (diabetes mellitus, type II), controlled     Diet controlled.   . Degenerative joint disease of knee, right   . Arthritis     "  right hand; right knee" (06/19/2014)    Past Surgical History  Procedure Laterality Date  . Inguinal hernia repair Left 1990's    ROS:  As stated in the HPI and negative for all other systems.  PHYSICAL EXAM BP 142/83 mmHg  Pulse 74  Ht 5\' 9"  (1.753 m)  Wt 203 lb (92.08 kg)  BMI 29.96 kg/m2 GENERAL:  Well appearing HEENT:  Pupils equal round and reactive, fundi not visualized, oral mucosa unremarkable, poor dentition.  NECK:  Positive jugular venous distention, waveform within normal limits, carotid upstroke brisk and symmetric, no bruits, no thyromegaly LUNGS:  Clear to auscultation bilaterally BACK:  No CVA tenderness CHEST:  Unremarkable HEART:  PMI not displaced or sustained,S1 within normal limits, fixed split S2, no S3, no S4, no clicks,  no rubs, no murmurs ABD:  Flat, positive bowel sounds normal in frequency in pitch, no bruits, no rebound, no guarding, no midline pulsatile mass, no hepatomegaly, no splenomegaly EXT:  2 plus pulses throughout, mild edema, no cyanosis no clubbing SKIN:  No rashes no nodules  EKG:  Sinus rhythm, rate 66,  right bundle branch block.  No change from previous. 10/24/2014  ASSESSMENT AND PLAN  Elevated troponin:  He had an anbormal Lexiscan Myoview.  Cath was suggested.  However, he still doesn't want to have this.  I tried again to convince him and he will let me know when he is able. I am going to check an echocardiogram as his EF was lower on the Myoview. Because his potassium was low at the last check I will also check a basic metabolic profile. I'm going to increase his carvedilol to 25 mg twice a day.  PVCs: These are not problematic. No further cardiovascular testing is suggested.  Syncope:  He has had no further syncope. No further evaluation is planned.  Mild renal insufficiency:  I will check a basic metabolic profile.  Previously reduced ejection fraction:  This will be evaluated as above.   HTN:  This is being managed in the context of treating his CHF

## 2014-10-24 NOTE — Patient Instructions (Signed)
Your physician recommends that you schedule a follow-up appointment in: 4 months with Dr. Percival Spanish  We have ordered labs and an echo for you to get done  We have increased your coreg to 25 mg  Two times a day

## 2014-10-25 LAB — BASIC METABOLIC PANEL
BUN: 19 mg/dL (ref 6–23)
CO2: 30 meq/L (ref 19–32)
Calcium: 9 mg/dL (ref 8.4–10.5)
Chloride: 103 mEq/L (ref 96–112)
Creat: 1.2 mg/dL (ref 0.50–1.35)
GLUCOSE: 95 mg/dL (ref 70–99)
POTASSIUM: 4.1 meq/L (ref 3.5–5.3)
SODIUM: 144 meq/L (ref 135–145)

## 2014-10-27 ENCOUNTER — Other Ambulatory Visit: Payer: Self-pay | Admitting: Family Medicine

## 2014-11-04 ENCOUNTER — Telehealth: Payer: Self-pay | Admitting: Family Medicine

## 2014-11-04 MED ORDER — TRAZODONE HCL 100 MG PO TABS: 100.0000 mg | ORAL_TABLET | Freq: Two times a day (BID) | ORAL | Status: DC

## 2014-11-04 NOTE — Telephone Encounter (Signed)
Pt informed that the correct dosage and direction for Trazodone was sent in to his pharmacy.  Derl Barrow, RN

## 2014-11-04 NOTE — Telephone Encounter (Signed)
Please inform patient his refill has been completed. Thank you.

## 2014-11-04 NOTE — Telephone Encounter (Signed)
This patient is calling because he is taking traZODone (DESYREL) and he stated that his PCP lowered the mg to 100 mg and instead of taking one at night like he was before he was instructed to take one in morning and one at night. He called to have it refilled, but the pharmacy stated that it was too early because apparently they still had the old instructions (to take the higher dose once a day) instead of the new instructions (the lower dose twice a day). At this point he is need of a new prescription with the correct instructions because he will run out by the end of this week. Please note that the mg is right, the instructions are what needed to be changed. Thank you, Fonda Kinder, ASA

## 2014-11-10 ENCOUNTER — Other Ambulatory Visit (HOSPITAL_COMMUNITY): Payer: Commercial Managed Care - HMO

## 2014-11-17 ENCOUNTER — Other Ambulatory Visit: Payer: Self-pay | Admitting: Cardiology

## 2014-11-17 ENCOUNTER — Ambulatory Visit (HOSPITAL_COMMUNITY)
Admission: RE | Admit: 2014-11-17 | Discharge: 2014-11-17 | Disposition: A | Discharge status: Home / Self Care | Payer: Commercial Managed Care - HMO | Source: Ambulatory Visit | Attending: Cardiovascular Disease | Admitting: Cardiovascular Disease

## 2014-11-17 DIAGNOSIS — I5021 Acute systolic (congestive) heart failure: Secondary | ICD-10-CM | POA: Insufficient documentation

## 2014-11-17 DIAGNOSIS — Z87891 Personal history of nicotine dependence: Secondary | ICD-10-CM | POA: Diagnosis not present

## 2014-11-17 DIAGNOSIS — E785 Hyperlipidemia, unspecified: Secondary | ICD-10-CM | POA: Insufficient documentation

## 2014-11-17 DIAGNOSIS — I10 Essential (primary) hypertension: Secondary | ICD-10-CM | POA: Diagnosis not present

## 2014-11-17 DIAGNOSIS — E119 Type 2 diabetes mellitus without complications: Secondary | ICD-10-CM | POA: Diagnosis not present

## 2014-11-17 DIAGNOSIS — I255 Ischemic cardiomyopathy: Secondary | ICD-10-CM | POA: Diagnosis not present

## 2014-11-20 ENCOUNTER — Telehealth: Payer: Self-pay | Admitting: *Deleted

## 2014-11-20 NOTE — Telephone Encounter (Signed)
-----   Message from Minus Breeding, MD sent at 11/19/2014  4:54 PM EDT ----- The patients EF is lower.  He had an abnormal Lexiscan Myoview.  However, he has refused cardiac cath.  He does have some suggestion of elevated pulmonary pressures.  He still needs to consider cath.  Call Mr. Augello with the results and send results to Andrena Mews, MD

## 2014-11-20 NOTE — Telephone Encounter (Signed)
Spoke with pt, aware of echo results. He is aware of the fact that he needs a cath and understands the risk of not having it done at this time. Questions regarding the procedure and recovery answered for the patient. He is currently taking care of his sister and he will call as soon as he is able to have the procedure done.

## 2014-12-01 ENCOUNTER — Other Ambulatory Visit: Payer: Self-pay | Admitting: Family Medicine

## 2014-12-02 ENCOUNTER — Other Ambulatory Visit: Payer: Self-pay | Admitting: Family Medicine

## 2014-12-16 ENCOUNTER — Other Ambulatory Visit: Payer: Self-pay | Admitting: Family Medicine

## 2014-12-16 ENCOUNTER — Telehealth: Payer: Self-pay | Admitting: Family Medicine

## 2014-12-16 DIAGNOSIS — E118 Type 2 diabetes mellitus with unspecified complications: Secondary | ICD-10-CM

## 2014-12-16 DIAGNOSIS — B351 Tinea unguium: Secondary | ICD-10-CM

## 2014-12-16 NOTE — Telephone Encounter (Signed)
[employer] silverback referral confirmation # [id number]  12/16/14 thru 06/14/15, Approved for 6 visits.

## 2014-12-16 NOTE — Telephone Encounter (Signed)
Will forward to MD to place a new referral.  Chai Verdejo,CMA  

## 2014-12-16 NOTE — Telephone Encounter (Signed)
Referral order placed.

## 2014-12-16 NOTE — Telephone Encounter (Signed)
Requesting a referral to see Dr. Kendell Bane, podiatrist. Patient has been seeing him every 3 months for his feet since he is diabetic and the visits have run out, new referral needs to be put in.

## 2015-01-14 ENCOUNTER — Other Ambulatory Visit: Payer: Self-pay | Admitting: Family Medicine

## 2015-01-19 ENCOUNTER — Other Ambulatory Visit: Payer: Self-pay | Admitting: Family Medicine

## 2015-01-19 ENCOUNTER — Telehealth: Payer: Self-pay | Admitting: Family Medicine

## 2015-01-19 DIAGNOSIS — I509 Heart failure, unspecified: Secondary | ICD-10-CM

## 2015-01-19 NOTE — Telephone Encounter (Signed)
This was called in recently.    Disp Refills Start End      traMADol (ULTRAM) 50 MG tablet 90 tablet 2 01/14/2015     Sig: TAKE 1 TABLET BY MOUTH EVERY 8 HOURS AS NEEDED FOR PAIN

## 2015-01-19 NOTE — Telephone Encounter (Signed)
Referral completed

## 2015-01-19 NOTE — Telephone Encounter (Signed)
Needs referral extended or "re-placed" for pt to continue seeing Minus Breeding, his cardiologist. Thank you, Fonda Kinder, ASA

## 2015-01-19 NOTE — Telephone Encounter (Signed)
Patient has [employer] and requires a new authorization every 6 months or 6 visits, whichever occurs first.  Will forward to MD.  Johnney Ou

## 2015-01-20 ENCOUNTER — Telehealth: Payer: Self-pay | Admitting: Family Medicine

## 2015-01-20 NOTE — Telephone Encounter (Signed)
Pt calling to request ALL further medications to be sent to [facility] on [address] in [place]. [phone]. Sadie Reynolds, ASA

## 2015-01-20 NOTE — Telephone Encounter (Signed)
Pharmacy has been changed in computer. Jazmin Hartsell,CMA

## 2015-01-27 ENCOUNTER — Other Ambulatory Visit: Payer: Self-pay | Admitting: Family Medicine

## 2015-01-27 ENCOUNTER — Ambulatory Visit (INDEPENDENT_AMBULATORY_CARE_PROVIDER_SITE_OTHER): Payer: Commercial Managed Care - HMO | Admitting: Podiatry

## 2015-01-27 ENCOUNTER — Encounter: Payer: Self-pay | Admitting: Podiatry

## 2015-01-27 DIAGNOSIS — M79676 Pain in unspecified toe(s): Secondary | ICD-10-CM | POA: Diagnosis not present

## 2015-01-27 DIAGNOSIS — B351 Tinea unguium: Secondary | ICD-10-CM

## 2015-01-27 NOTE — Patient Instructions (Signed)
Diabetes and Foot Care Diabetes may cause you to have problems because of poor blood supply (circulation) to your feet and legs. This may cause the skin on your feet to become thinner, break easier, and heal more slowly. Your skin may become dry, and the skin may peel and crack. You may also have nerve damage in your legs and feet causing decreased feeling in them. You may not notice minor injuries to your feet that could lead to infections or more serious problems. Taking care of your feet is one of the most important things you can do for yourself.  HOME CARE INSTRUCTIONS  Wear shoes at all times, even in the house. Do not go barefoot. Bare feet are easily injured.  Check your feet daily for blisters, cuts, and redness. If you cannot see the bottom of your feet, use a mirror or ask someone for help.  Wash your feet with warm water (do not use hot water) and mild soap. Then pat your feet and the areas between your toes until they are completely dry. Do not soak your feet as this can dry your skin.  Apply a moisturizing lotion or petroleum jelly (that does not contain alcohol and is unscented) to the skin on your feet and to dry, brittle toenails. Do not apply lotion between your toes.  Trim your toenails straight across. Do not dig under them or around the cuticle. File the edges of your nails with an emery board or nail file.  Do not cut corns or calluses or try to remove them with medicine.  Wear clean socks or stockings every day. Make sure they are not too tight. Do not wear knee-high stockings since they may decrease blood flow to your legs.  Wear shoes that fit properly and have enough cushioning. To break in new shoes, wear them for just a few hours a day. This prevents you from injuring your feet. Always look in your shoes before you put them on to be sure there are no objects inside.  Do not cross your legs. This may decrease the blood flow to your feet.  If you find a minor scrape,  cut, or break in the skin on your feet, keep it and the skin around it clean and dry. These areas may be cleansed with mild soap and water. Do not cleanse the area with peroxide, alcohol, or iodine.  When you remove an adhesive bandage, be sure not to damage the skin around it.  If you have a wound, look at it several times a day to make sure it is healing.  Do not use heating pads or hot water bottles. They may burn your skin. If you have lost feeling in your feet or legs, you may not know it is happening until it is too late.  Make sure your health care provider performs a complete foot exam at least annually or more often if you have foot problems. Report any cuts, sores, or bruises to your health care provider immediately. SEEK MEDICAL CARE IF:   You have an injury that is not healing.  You have cuts or breaks in the skin.  You have an ingrown nail.  You notice redness on your legs or feet.  You feel burning or tingling in your legs or feet.  You have pain or cramps in your legs and feet.  Your legs or feet are numb.  Your feet always feel cold. SEEK IMMEDIATE MEDICAL CARE IF:   There is increasing redness,   swelling, or pain in or around a wound.  There is a red line that goes up your leg.  Pus is coming from a wound.  You develop a fever or as directed by your health care provider.  You notice a bad smell coming from an ulcer or wound. Document Released: 05/13/2000 Document Revised: 01/16/2013 Document Reviewed: 10/23/2012 [employer] Patient Information 2015 [employer]. This information is not intended to replace advice given to you by your health care provider. Make sure you discuss any questions you have with your health care provider.  

## 2015-01-27 NOTE — Progress Notes (Signed)
Patient ID: Patrick [name], male   DOB: [birth year], 63 y.o.   MRN: [id number]  Subjective: Patient presents for a scheduled visit complaining of painful toenails her request toenail debridement  Objective: Pitting edema bilaterally The toenails are hypertrophic, incurvated, discolored, brittle and tender to direct palpation 6-10 No skin lesions noted bilaterally  Assessment: Symptomatic onychomycoses 6-10  Plan: Debridement toenails 10 mechanically and electrically without any bleeding  Reappoint 3 months

## 2015-01-28 ENCOUNTER — Ambulatory Visit: Payer: Commercial Managed Care - HMO | Admitting: Podiatry

## 2015-02-06 ENCOUNTER — Ambulatory Visit (INDEPENDENT_AMBULATORY_CARE_PROVIDER_SITE_OTHER): Payer: Commercial Managed Care - HMO | Admitting: Family Medicine

## 2015-02-06 ENCOUNTER — Encounter: Payer: Self-pay | Admitting: Family Medicine

## 2015-02-06 VITALS — BP 135/80 | HR 52 | Temp 97.7°F | Wt 211.7 lb

## 2015-02-06 DIAGNOSIS — I10 Essential (primary) hypertension: Secondary | ICD-10-CM | POA: Diagnosis not present

## 2015-02-06 DIAGNOSIS — R600 Localized edema: Secondary | ICD-10-CM

## 2015-02-06 DIAGNOSIS — Z23 Encounter for immunization: Secondary | ICD-10-CM

## 2015-02-06 DIAGNOSIS — I509 Heart failure, unspecified: Secondary | ICD-10-CM | POA: Diagnosis not present

## 2015-02-06 MED ORDER — ZOSTER VACCINE LIVE 19400 UNT/0.65ML ~~LOC~~ SOLR: 0.6500 mL | Freq: Once | SUBCUTANEOUS | Status: DC

## 2015-02-06 NOTE — Progress Notes (Signed)
Subjective:     Patient ID: Patrick [name], male   DOB: [birth year], 64 y.o.   MRN: [id number]  HPI CHF/SOB/leg swelling: SOB Worse with exertion worsening over the past 1-2 month. He has some leg swelling which worsens as the day progresses. No Chest pain. He is compliant with medicine. He sleeps propped up in bed with 2 pillows for months. HTN: His BP at home has been fine. He is compliant with his medication. He is here for follow up. HM: Will like Shingles vaccination. He also need flu shot today.  Current Outpatient Prescriptions on File Prior to Visit  Medication Sig Dispense Refill  . aspirin EC 81 MG EC tablet Take 1 tablet (81 mg total) by mouth daily. 30 tablet 0  . BIOFLAVONOIDS PO Take 1 tablet by mouth 2 (two) times daily.    . carvedilol (COREG) 25 MG tablet Take 1 tablet (25 mg total) by mouth 2 (two) times daily. 180 tablet 3  . finasteride (PROSCAR) 5 MG tablet TAKE 1 TABLET BY MOUTH DAILY 90 tablet 1  . furosemide (LASIX) 40 MG tablet TAKE 1- 2 TABLETS BY MOUTH EVERY MORNING AND 1 TABLET BY MOUTH EVERY EVENING. HOLD IF BLOOD PRESSURE IS<110/70 90 tablet 3  . ibuprofen (ADVIL,MOTRIN) 800 MG tablet Take 800 mg by mouth every 12 (twelve) hours as needed for headache or moderate pain.    Marland Kitchen losartan (COZAAR) 50 MG tablet TAKE 1 TABLET BY MOUTH EVERY DAY 90 tablet 1  . lovastatin (MEVACOR) 20 MG tablet TAKE 1 TABLET BY MOUTH AT BEDTIME 90 tablet 1  . Multiple Vitamins-Minerals (MULTIVITAMIN WITH MINERALS) tablet Take 1 tablet by mouth daily.    Marland Kitchen omega-3 acid ethyl esters (LOVAZA) 1 G capsule TAKE 2 CAPSULES BY MOUTH TWICE DAILY 360 capsule 0  . potassium chloride SA (K-DUR,KLOR-CON) 20 MEQ tablet TAKE 1 TABLET BY MOUTH EVERY DAY 90 tablet 1  . tamsulosin (FLOMAX) 0.4 MG CAPS capsule Take 0.4 mg by mouth daily.     . traMADol (ULTRAM) 50 MG tablet TAKE 1 TABLET BY MOUTH EVERY 8 HOURS AS NEEDED FOR PAIN 90 tablet 2  . traZODone (DESYREL) 100 MG tablet Take 1 tablet (100 mg total) by mouth  2 (two) times daily. 180 tablet 1  . Ascorbic Acid (VITAMIN C) 1000 MG tablet Take 2,000 mg by mouth 2 (two) times daily.    . fluticasone (FLONASE) 50 MCG/ACT nasal spray Place 2 sprays into both nostrils daily. 16 g 6  . senna (SENOKOT) 8.6 MG TABS tablet Take 1 tablet (8.6 mg total) by mouth daily as needed for mild constipation. (Patient not taking: Reported on 02/06/2015) 30 each 0   No current facility-administered medications on file prior to visit.   Past Medical History  Diagnosis Date  . CHF (congestive heart failure)     Preserved EF  . Hypertension   . Hyperlipidemia   . Enlarged prostate   . Pneumonia 05/2014  . DM II (diabetes mellitus, type II), controlled     Diet controlled.   . Degenerative joint disease of knee, right   . Arthritis     "right hand; right knee" (06/19/2014)     Review of Systems  Respiratory: Positive for shortness of breath. Negative for chest tightness and wheezing.   Cardiovascular: Positive for leg swelling.  Gastrointestinal: Negative.   Neurological: Negative.   All other systems reviewed and are negative.  Filed Vitals:   02/06/15 1101  BP: 135/80  Pulse: 52  Temp: 97.7 F (36.5 C)  Weight: 211 lb 11.2 oz (96.026 kg)  SpO2: 97%       Filed Vitals:   02/06/15 1101  BP: 135/80  Pulse: 52  Temp: 97.7 F (36.5 C)  Weight: 211 lb 11.2 oz (96.026 kg)  SpO2: 97%    Objective:   Physical Exam  Constitutional: He is oriented to person, place, and time. He appears well-developed. No distress.  Cardiovascular: Normal rate, regular rhythm and intact distal pulses.   No murmur heard. Pulmonary/Chest: Effort normal and breath sounds normal. No respiratory distress. He has no wheezes.  Abdominal: Soft. Bowel sounds are normal. He exhibits no distension and no mass. There is no tenderness.  Musculoskeletal: Normal range of motion. He exhibits edema.  Neurological: He is alert and oriented to person, place, and time.  Nursing note and  vitals reviewed.      Assessment:     CHF Pedal edema HTN Health maintenance     Plan:     Check problem list.

## 2015-02-06 NOTE — Patient Instructions (Signed)

## 2015-02-06 NOTE — Assessment & Plan Note (Signed)
For his health maintenance. Flu shot given today. Zostavax e-prescribed to his pharmacy. He will get vaccinated at the pharmacy,  NB: Ignore the typo above. Zostavax not Zostrix was e-prescribed.

## 2015-02-06 NOTE — Assessment & Plan Note (Signed)
+   edema today. I however did not change his Lasix dose since his BP might not tolerate an increase. No lung crackles. Continue current dose of Lasix. If worsening of his edema I will consider increasing his dose with close BP monitoring.

## 2015-02-06 NOTE — Assessment & Plan Note (Signed)
For his health maintenance. Flu shot given today. Zostrix e-prescribed to his pharmacy.

## 2015-02-06 NOTE — Assessment & Plan Note (Addendum)
Patients EF is lower. He had an abnormal Lexiscan Myoview. Cardiac cath recommended but he is currently declining due to concern about his sister. He is the main person caring for his sister,who need help with movement from her bed to chair and help with other ADLs due to her weight. Patrick [name] is concern he will not be able to do this if he gets surgery. As discussed with him, I will work with our Education officer, museum and see if we can help him set up a HHA while he is in the hospital getting his procedure done. He agreed on this. I will contact him next week with more information. Continue ASA, Statin, Coreg 25 mg BID and Lasix.

## 2015-02-06 NOTE — Assessment & Plan Note (Signed)
BP looks good today. Continue current regimen. 

## 2015-02-19 ENCOUNTER — Ambulatory Visit (INDEPENDENT_AMBULATORY_CARE_PROVIDER_SITE_OTHER): Payer: Commercial Managed Care - HMO | Admitting: Cardiology

## 2015-02-19 ENCOUNTER — Encounter: Payer: Self-pay | Admitting: Cardiology

## 2015-02-19 VITALS — BP 118/78 | HR 59 | Ht 69.0 in | Wt 212.5 lb

## 2015-02-19 DIAGNOSIS — I509 Heart failure, unspecified: Secondary | ICD-10-CM | POA: Diagnosis not present

## 2015-02-19 DIAGNOSIS — Z79899 Other long term (current) drug therapy: Secondary | ICD-10-CM | POA: Diagnosis not present

## 2015-02-19 DIAGNOSIS — E785 Hyperlipidemia, unspecified: Secondary | ICD-10-CM | POA: Diagnosis not present

## 2015-02-19 NOTE — Progress Notes (Signed)
HPI The patient presents for follow-up after a syncopal episode. He was hospitalized and we did see him in consultation in January of this year. In 2009 he had a mildly reduced ejection fraction. However, at the time of his hospitalization his EF was well-preserved. He did have some PVCs. He apparently had one triplet noted. There was one elevated troponin.  Because of the elevated troponin I did send him for a stress perfusion study. He had a large severe intensity fixed inferior defect suggestive of scar. There was some reversible ischemia possibly in the mid and distal anterior wall. He was to have cardiac catheterization.  However, he declined this because he is taking care of his sister.  Was also noted to have elevated pulmonary pressures on his echo again has not wanted right heart catheterization. Within manage him medically.  Today he is denying any symptoms. He says he has no shortness of breath, PND or orthopnea. He has no palpitations, presyncope. He's had no weight gain or edema.   He tries to watch his salt. His most physical activity use hoping to get his sister into bed. She apparently weighs over 300 pounds.   No Known Allergies  Current Outpatient Prescriptions  Medication Sig Dispense Refill  . Ascorbic Acid (VITAMIN C) 1000 MG tablet Take 2,000 mg by mouth 2 (two) times daily.    Marland Kitchen aspirin EC 81 MG EC tablet Take 1 tablet (81 mg total) by mouth daily. 30 tablet 0  . BIOFLAVONOIDS PO Take 1 tablet by mouth 2 (two) times daily.    . carvedilol (COREG) 25 MG tablet Take 1 tablet (25 mg total) by mouth 2 (two) times daily. 180 tablet 3  . EQL SENNA LAXATIVE 8.6 MG tablet Take 8.6 mg by mouth 3 (three) times daily. Pt states he rotates between 2 and 3 tablets nightly    . finasteride (PROSCAR) 5 MG tablet TAKE 1 TABLET BY MOUTH DAILY 90 tablet 1  . fluticasone (FLONASE) 50 MCG/ACT nasal spray Place 2 sprays into both nostrils daily. 16 g 6  . furosemide (LASIX) 40 MG tablet TAKE  1- 2 TABLETS BY MOUTH EVERY MORNING AND 1 TABLET BY MOUTH EVERY EVENING. HOLD IF BLOOD PRESSURE IS<110/70 90 tablet 3  . ibuprofen (ADVIL,MOTRIN) 800 MG tablet Take 800 mg by mouth every 12 (twelve) hours as needed for headache or moderate pain.    Marland Kitchen losartan (COZAAR) 50 MG tablet TAKE 1 TABLET BY MOUTH EVERY DAY 90 tablet 1  . lovastatin (MEVACOR) 20 MG tablet TAKE 1 TABLET BY MOUTH AT BEDTIME 90 tablet 1  . Multiple Vitamins-Minerals (MULTIVITAMIN WITH MINERALS) tablet Take 1 tablet by mouth daily.    Marland Kitchen omega-3 acid ethyl esters (LOVAZA) 1 G capsule TAKE 2 CAPSULES BY MOUTH TWICE DAILY 360 capsule 0  . potassium chloride SA (K-DUR,KLOR-CON) 20 MEQ tablet TAKE 1 TABLET BY MOUTH EVERY DAY 90 tablet 1  . senna (SENOKOT) 8.6 MG TABS tablet Take 1 tablet (8.6 mg total) by mouth daily as needed for mild constipation. 30 each 0  . tamsulosin (FLOMAX) 0.4 MG CAPS capsule Take 0.4 mg by mouth daily.     . traMADol (ULTRAM) 50 MG tablet TAKE 1 TABLET BY MOUTH EVERY 8 HOURS AS NEEDED FOR PAIN 90 tablet 2  . traZODone (DESYREL) 100 MG tablet Take 1 tablet (100 mg total) by mouth 2 (two) times daily. 180 tablet 1  . zoster vaccine live, PF, (ZOSTAVAX) 37169 UNT/0.65ML injection Inject 19,400 Units into  the skin once. 1 each 0   No current facility-administered medications for this visit.    Past Medical History  Diagnosis Date  . CHF (congestive heart failure)     Preserved EF  . Hypertension   . Hyperlipidemia   . Enlarged prostate   . Pneumonia 05/2014  . DM II (diabetes mellitus, type II), controlled     Diet controlled.   . Degenerative joint disease of knee, right   . Arthritis     "right hand; right knee" (06/19/2014)    Past Surgical History  Procedure Laterality Date  . Inguinal hernia repair Left 1990's    ROS:  As stated in the HPI and negative for all other systems.  PHYSICAL EXAM BP 118/78 mmHg  Pulse 59  Ht 5\' 9"  (1.753 m)  Wt 212 lb 8 oz (96.389 kg)  BMI 31.37  kg/m2 GENERAL:  Well appearing HEENT:  Pupils equal round and reactive, fundi not visualized, oral mucosa unremarkable, poor dentition.  NECK:  Positive jugular venous distention to the jaw at 45 degrees, waveform within normal limits, carotid upstroke brisk and symmetric, no bruits, no thyromegaly LUNGS:  Clear to auscultation bilaterally BACK:  No CVA tenderness CHEST:  Unremarkable HEART:  PMI not displaced or sustained,S1 within normal limits, fixed split S2, no S3, no S4, no clicks, no rubs, no murmurs ABD:  Flat, positive bowel sounds normal in frequency in pitch, no bruits, no rebound, no guarding, no midline pulsatile mass, no hepatomegaly, no splenomegaly EXT:  2 plus pulses throughout, mild/moderate edema, no cyanosis no clubbing   EKG:  Sinus rhythm, rate 59,  right bundle branch block.  First-degree AV block. No change from previous   02/19/2015  ASSESSMENT AND PLAN  Elevated troponin:  He had an anbormal Lexiscan Myoview.  Cath was suggested.  However, he still doesn't want to have this.  His BP at this time will not allow med titration but I will continue to consider this in the future.    PVCs: These are not problematic. No further cardiovascular testing is suggested.  Syncope:  He has had no further syncope. No further evaluation is planned.  Mild renal insufficiency:  I will check a basic metabolic profile.  Previously reduced ejection fraction:  This will be evaluated as above.   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  However, he does not want further testing and without this I would not consider any vasocative meds for his pulm HTN.

## 2015-02-19 NOTE — Patient Instructions (Signed)
Your physician wants you to follow-up in: 6 Months. You will receive a reminder letter in the mail two months in advance. If you Andrews't receive a letter, please call our office to schedule the follow-up appointment.  Your physician recommends that you return for lab work in: Today BMP

## 2015-02-20 LAB — BASIC METABOLIC PANEL
BUN: 14 mg/dL (ref 7–25)
CALCIUM: 8.9 mg/dL (ref 8.6–10.3)
CO2: 33 mmol/L — AB (ref 20–31)
CREATININE: 1.2 mg/dL (ref 0.70–1.25)
Chloride: 101 mmol/L (ref 98–110)
Glucose, Bld: 85 mg/dL (ref 65–99)
Potassium: 3.3 mmol/L — ABNORMAL LOW (ref 3.5–5.3)
Sodium: 144 mmol/L (ref 135–146)

## 2015-02-24 ENCOUNTER — Telehealth: Payer: Self-pay | Admitting: *Deleted

## 2015-02-24 DIAGNOSIS — Z79899 Other long term (current) drug therapy: Secondary | ICD-10-CM

## 2015-02-24 MED ORDER — POTASSIUM CHLORIDE CRYS ER 20 MEQ PO TBCR: EXTENDED_RELEASE_TABLET | ORAL | Status: DC

## 2015-02-24 NOTE — Telephone Encounter (Signed)
-----   Message from Minus Breeding, MD sent at 02/20/2015  8:06 AM EDT ----- Ask him to increase his potassium to 40 meq daily.  He can then have another BMET in two weeks.  Call Mr. Trickel with the results and send results to Andrena Mews, MD

## 2015-03-04 ENCOUNTER — Other Ambulatory Visit: Payer: Self-pay | Admitting: Family Medicine

## 2015-03-13 LAB — BASIC METABOLIC PANEL
BUN: 14 mg/dL (ref 7–25)
CHLORIDE: 102 mmol/L (ref 98–110)
CO2: 29 mmol/L (ref 20–31)
Calcium: 8.7 mg/dL (ref 8.6–10.3)
Creat: 1.33 mg/dL — ABNORMAL HIGH (ref 0.70–1.25)
GLUCOSE: 80 mg/dL (ref 65–99)
POTASSIUM: 4 mmol/L (ref 3.5–5.3)
SODIUM: 143 mmol/L (ref 135–146)

## 2015-03-19 ENCOUNTER — Other Ambulatory Visit: Payer: Self-pay | Admitting: Family Medicine

## 2015-03-23 ENCOUNTER — Other Ambulatory Visit: Payer: Self-pay | Admitting: Family Medicine

## 2015-03-23 MED ORDER — IBUPROFEN 800 MG PO TABS: 800.0000 mg | ORAL_TABLET | Freq: Two times a day (BID) | ORAL | Status: DC | PRN

## 2015-03-23 NOTE — Telephone Encounter (Signed)
Pls advise patient this medication can potentially worsen his CHF.

## 2015-03-23 NOTE — Telephone Encounter (Signed)
Refill completed.

## 2015-03-23 NOTE — Telephone Encounter (Signed)
Spoke with patient and he is aware of this.  States that he doesn't use it a lot and last filled it in January of this year.  He only takes it for his foot pain and will only take 1 pill when they pain first comes on.  He would prefer any that will take his foot pain away. Patrick [name],CMA

## 2015-03-23 NOTE — Telephone Encounter (Signed)
Pt is calling because the doctor refused his refill request for ibuprofen. He said that the last time he had this refilled was 05/2014. He only takes this occasionally for other pains so not to depend on Tramadol so much. jw

## 2015-03-30 ENCOUNTER — Encounter: Payer: Self-pay | Admitting: Family Medicine

## 2015-03-30 ENCOUNTER — Other Ambulatory Visit: Payer: Self-pay | Admitting: Family Medicine

## 2015-03-30 ENCOUNTER — Ambulatory Visit (INDEPENDENT_AMBULATORY_CARE_PROVIDER_SITE_OTHER): Payer: Commercial Managed Care - HMO | Admitting: Family Medicine

## 2015-03-30 VITALS — BP 147/78 | HR 69 | Temp 97.6°F | Ht 69.0 in | Wt 222.2 lb

## 2015-03-30 DIAGNOSIS — N5089 Other specified disorders of the male genital organs: Secondary | ICD-10-CM

## 2015-03-30 MED ORDER — TORSEMIDE 20 MG PO TABS: 60.0000 mg | ORAL_TABLET | Freq: Every day | ORAL | Status: DC

## 2015-03-30 NOTE — Progress Notes (Signed)
   Subjective:    Patient ID: Patrick [name], male    DOB: 1951/02/07, 64 y.o.   MRN: [id number]  HPI  Patient presents for Same Day Appointment  CC: groin swelling  # Scrotal swelling:  Started about 6 weeks ago  Both sides swollen at the same time, did not start as one side worse than the other  Went and saw his urologist Dr. Louis Meckel, says he wasn't told much other than the swelling above his scrotum was edema.  Scrotum has gotten much larger over past 2-3 weeks  Some pain but not the most concerning part  He is able to void, but has BPH and doesn't feel his lasix is working (actually switched himself to taking 120mg  in the morning rather than splitting the doses)  Always has swelling in his legs ROS: No SOB, no penile discharge, no nausea or vomiting  Social Hx: former smoker  Review of Systems   See HPI for ROS. All other systems reviewed and are negative.  Past medical history, surgical, family, and social history reviewed and updated in the EMR as appropriate.  Objective:  BP 147/78 mmHg  Pulse 69  Temp(Src) 97.6 F (36.4 C) (Oral)  Ht 5\' 9"  (1.753 m)  Wt 222 lb 3.2 oz (100.789 kg)  BMI 32.80 kg/m2 Vitals and nursing note reviewed  General: NAD CV: RRR, normal heart sounds, no murmurs appreciated Resp: clear to auscultation bilaterally, normal effort Abdomen: no presacral edema Ext: 3+ pitting edema lower extremities bilaterally  GU: very enlarged scrotum bilaterally, transilluminates, unable to fully elicit for inguinal hernia given the amount of fluid in the scrotum. There are no ulcerations. Skin: no rashes Neuro: alert and oriented  Assessment & Plan:  Scrotal swelling More concerning for worsened edema from CHF. He reports minimal to no help with current dose of lasix (120mg  daily, instructed to take 80mg  in AM and 40mg  in PM but that also was not helping). No evidence for torsion, no unilateral swelling that would be concerning for mass compressing  gonadal vein. Recommended trying to elevate scrotum if possible (laying on incline, as he does not have any respiratory distress with laying down), switch diuretic to torsemide 60mg  daily +/- 20mg  depending on response, scrotal ultrasound scheduled for 4 days from now, also already has PCP follow up scheduled on that day as well. Follow up with results pending diuretic response and ultrasound.

## 2015-03-30 NOTE — Patient Instructions (Signed)
Try to elevate your scrotum above your heart (lay down at an incline with your head at the bottom of the incline and your feet at the top). You do not need to sleep this way, just try it for several hours  Try taking the torsemide 3 tablets daily. If getting a lot of urine, can decrease to 2 tablets daily, or if not peeing enough can increase to 4 tablets daily.  Ultrasound Friday 11/4 at 11:30am, need to arrive at 11:15am, at [facility]

## 2015-03-30 NOTE — Assessment & Plan Note (Signed)
More concerning for worsened edema from CHF. He reports minimal to no help with current dose of lasix (120mg  daily, instructed to take 80mg  in AM and 40mg  in PM but that also was not helping). No evidence for torsion, no unilateral swelling that would be concerning for mass compressing gonadal vein. Recommended trying to elevate scrotum if possible (laying on incline, as he does not have any respiratory distress with laying down), switch diuretic to torsemide 60mg  daily +/- 20mg  depending on response, scrotal ultrasound scheduled for 4 days from now, also already has PCP follow up scheduled on that day as well. Follow up with results pending diuretic response and ultrasound.

## 2015-04-03 ENCOUNTER — Inpatient Hospital Stay (HOSPITAL_COMMUNITY)
Admission: EM | Admit: 2015-04-03 | Discharge: 2015-04-07 | DRG: 292 | Disposition: A | Discharge status: Home Health | Payer: Commercial Managed Care - HMO | Attending: Family Medicine | Admitting: Family Medicine

## 2015-04-03 ENCOUNTER — Emergency Department (HOSPITAL_COMMUNITY): Payer: Commercial Managed Care - HMO

## 2015-04-03 ENCOUNTER — Ambulatory Visit (INDEPENDENT_AMBULATORY_CARE_PROVIDER_SITE_OTHER): Payer: Commercial Managed Care - HMO | Admitting: Family Medicine

## 2015-04-03 ENCOUNTER — Encounter (HOSPITAL_COMMUNITY): Payer: Self-pay | Admitting: Emergency Medicine

## 2015-04-03 ENCOUNTER — Ambulatory Visit (HOSPITAL_COMMUNITY): Payer: Commercial Managed Care - HMO

## 2015-04-03 ENCOUNTER — Encounter: Payer: Self-pay | Admitting: Family Medicine

## 2015-04-03 VITALS — BP 139/84 | HR 63 | Temp 98.4°F | Wt 226.0 lb

## 2015-04-03 DIAGNOSIS — I5023 Acute on chronic systolic (congestive) heart failure: Secondary | ICD-10-CM

## 2015-04-03 DIAGNOSIS — Z79899 Other long term (current) drug therapy: Secondary | ICD-10-CM | POA: Diagnosis not present

## 2015-04-03 DIAGNOSIS — I509 Heart failure, unspecified: Secondary | ICD-10-CM

## 2015-04-03 DIAGNOSIS — N179 Acute kidney failure, unspecified: Secondary | ICD-10-CM | POA: Diagnosis present

## 2015-04-03 DIAGNOSIS — M179 Osteoarthritis of knee, unspecified: Secondary | ICD-10-CM | POA: Diagnosis present

## 2015-04-03 DIAGNOSIS — E785 Hyperlipidemia, unspecified: Secondary | ICD-10-CM | POA: Diagnosis present

## 2015-04-03 DIAGNOSIS — E119 Type 2 diabetes mellitus without complications: Secondary | ICD-10-CM | POA: Diagnosis present

## 2015-04-03 DIAGNOSIS — M13841 Other specified arthritis, right hand: Secondary | ICD-10-CM | POA: Diagnosis present

## 2015-04-03 DIAGNOSIS — Z87891 Personal history of nicotine dependence: Secondary | ICD-10-CM

## 2015-04-03 DIAGNOSIS — K625 Hemorrhage of anus and rectum: Secondary | ICD-10-CM | POA: Diagnosis present

## 2015-04-03 DIAGNOSIS — N5089 Other specified disorders of the male genital organs: Secondary | ICD-10-CM

## 2015-04-03 DIAGNOSIS — E876 Hypokalemia: Secondary | ICD-10-CM | POA: Diagnosis present

## 2015-04-03 DIAGNOSIS — N400 Enlarged prostate without lower urinary tract symptoms: Secondary | ICD-10-CM | POA: Diagnosis present

## 2015-04-03 DIAGNOSIS — N139 Obstructive and reflux uropathy, unspecified: Secondary | ICD-10-CM | POA: Diagnosis not present

## 2015-04-03 DIAGNOSIS — D696 Thrombocytopenia, unspecified: Secondary | ICD-10-CM | POA: Diagnosis present

## 2015-04-03 DIAGNOSIS — I2510 Atherosclerotic heart disease of native coronary artery without angina pectoris: Secondary | ICD-10-CM | POA: Diagnosis present

## 2015-04-03 DIAGNOSIS — I110 Hypertensive heart disease with heart failure: Principal | ICD-10-CM | POA: Diagnosis present

## 2015-04-03 DIAGNOSIS — D509 Iron deficiency anemia, unspecified: Secondary | ICD-10-CM | POA: Diagnosis present

## 2015-04-03 DIAGNOSIS — L304 Erythema intertrigo: Secondary | ICD-10-CM | POA: Diagnosis present

## 2015-04-03 HISTORY — DX: Other specified disorders of the male genital organs: N50.89

## 2015-04-03 LAB — URINALYSIS, ROUTINE W REFLEX MICROSCOPIC
Bilirubin Urine: NEGATIVE
Glucose, UA: NEGATIVE mg/dL
Hgb urine dipstick: NEGATIVE
KETONES UR: 15 mg/dL — AB
LEUKOCYTES UA: NEGATIVE
NITRITE: NEGATIVE
PH: 5.5 (ref 5.0–8.0)
PROTEIN: NEGATIVE mg/dL
Specific Gravity, Urine: 1.016 (ref 1.005–1.030)
Urobilinogen, UA: 1 mg/dL (ref 0.0–1.0)

## 2015-04-03 LAB — COMPREHENSIVE METABOLIC PANEL
ALT: 13 U/L — AB (ref 17–63)
AST: 26 U/L (ref 15–41)
Albumin: 3.3 g/dL — ABNORMAL LOW (ref 3.5–5.0)
Alkaline Phosphatase: 109 U/L (ref 38–126)
Anion gap: 15 (ref 5–15)
BUN: 15 mg/dL (ref 6–20)
CHLORIDE: 98 mmol/L — AB (ref 101–111)
CO2: 29 mmol/L (ref 22–32)
CREATININE: 1.46 mg/dL — AB (ref 0.61–1.24)
Calcium: 9.4 mg/dL (ref 8.9–10.3)
GFR calc Af Amer: 56 mL/min — ABNORMAL LOW (ref >60)
GFR calc non Af Amer: 48 mL/min — ABNORMAL LOW (ref >60)
Glucose, Bld: 64 mg/dL — ABNORMAL LOW (ref 65–99)
Potassium: 4.1 mmol/L (ref 3.5–5.1)
SODIUM: 142 mmol/L (ref 135–145)
Total Bilirubin: 1.9 mg/dL — ABNORMAL HIGH (ref 0.3–1.2)
Total Protein: 6.4 g/dL — ABNORMAL LOW (ref 6.5–8.1)

## 2015-04-03 LAB — I-STAT CHEM 8, ED
BUN: 18 mg/dL (ref 6–20)
CREATININE: 1.5 mg/dL — AB (ref 0.61–1.24)
Calcium, Ion: 1.13 mmol/L (ref 1.13–1.30)
Chloride: 100 mmol/L — ABNORMAL LOW (ref 101–111)
Glucose, Bld: 69 mg/dL (ref 65–99)
HEMATOCRIT: 34 % — AB (ref 39.0–52.0)
HEMOGLOBIN: 11.6 g/dL — AB (ref 13.0–17.0)
POTASSIUM: 4.4 mmol/L (ref 3.5–5.1)
Sodium: 140 mmol/L (ref 135–145)
TCO2: 27 mmol/L (ref 0–100)

## 2015-04-03 LAB — CBC WITH DIFFERENTIAL/PLATELET
BASOS ABS: 0.1 10*3/uL (ref 0.0–0.1)
Basophils Relative: 1 %
EOS ABS: 0.1 10*3/uL (ref 0.0–0.7)
Eosinophils Relative: 1 %
HCT: 34.5 % — ABNORMAL LOW (ref 39.0–52.0)
HEMOGLOBIN: 10.3 g/dL — AB (ref 13.0–17.0)
LYMPHS PCT: 18 %
Lymphs Abs: 0.9 10*3/uL (ref 0.7–4.0)
MCH: 22 pg — ABNORMAL LOW (ref 26.0–34.0)
MCHC: 29.9 g/dL — ABNORMAL LOW (ref 30.0–36.0)
MCV: 73.6 fL — ABNORMAL LOW (ref 78.0–100.0)
Monocytes Absolute: 0.6 10*3/uL (ref 0.1–1.0)
Monocytes Relative: 12 %
NEUTROS PCT: 68 %
Neutro Abs: 3.2 10*3/uL (ref 1.7–7.7)
Platelets: 124 10*3/uL — ABNORMAL LOW (ref 150–400)
RBC: 4.69 MIL/uL (ref 4.22–5.81)
RDW: 18.6 % — ABNORMAL HIGH (ref 11.5–15.5)
WBC: 4.9 10*3/uL (ref 4.0–10.5)

## 2015-04-03 LAB — BRAIN NATRIURETIC PEPTIDE: B NATRIURETIC PEPTIDE 5: 980.2 pg/mL — AB (ref 0.0–100.0)

## 2015-04-03 MED ORDER — TRAMADOL HCL 50 MG PO TABS
50.0000 mg | ORAL_TABLET | Freq: Three times a day (TID) | ORAL | Status: DC | PRN
  Administered 2015-04-06: 50 mg via ORAL
  Filled 2015-04-03: qty 1

## 2015-04-03 MED ORDER — SODIUM CHLORIDE 0.9 % IJ SOLN: 3.0000 mL | INTRAMUSCULAR | Status: DC | PRN

## 2015-04-03 MED ORDER — LOSARTAN POTASSIUM 50 MG PO TABS
50.0000 mg | ORAL_TABLET | Freq: Every day | ORAL | Status: DC
  Administered 2015-04-04 – 2015-04-07 (×4): 50 mg via ORAL
  Filled 2015-04-03 (×4): qty 1

## 2015-04-03 MED ORDER — FINASTERIDE 5 MG PO TABS
5.0000 mg | ORAL_TABLET | Freq: Every day | ORAL | Status: DC
  Administered 2015-04-04 – 2015-04-07 (×4): 5 mg via ORAL
  Filled 2015-04-03 (×5): qty 1

## 2015-04-03 MED ORDER — SODIUM CHLORIDE 0.9 % IV SOLN: 250.0000 mL | INTRAVENOUS | Status: DC | PRN

## 2015-04-03 MED ORDER — FLUTICASONE PROPIONATE 50 MCG/ACT NA SUSP
2.0000 | Freq: Every day | NASAL | Status: DC
  Administered 2015-04-04 – 2015-04-07 (×4): 2 via NASAL
  Filled 2015-04-03: qty 16

## 2015-04-03 MED ORDER — SODIUM CHLORIDE 0.9 % IJ SOLN
3.0000 mL | Freq: Two times a day (BID) | INTRAMUSCULAR | Status: DC
  Administered 2015-04-03 – 2015-04-07 (×8): 3 mL via INTRAVENOUS

## 2015-04-03 MED ORDER — ASPIRIN EC 81 MG PO TBEC
81.0000 mg | DELAYED_RELEASE_TABLET | Freq: Every day | ORAL | Status: DC
  Administered 2015-04-04 – 2015-04-07 (×4): 81 mg via ORAL
  Filled 2015-04-03 (×4): qty 1

## 2015-04-03 MED ORDER — POTASSIUM CHLORIDE CRYS ER 20 MEQ PO TBCR
20.0000 meq | EXTENDED_RELEASE_TABLET | Freq: Every day | ORAL | Status: DC
  Administered 2015-04-04: 20 meq via ORAL
  Filled 2015-04-03: qty 1

## 2015-04-03 MED ORDER — PRAVASTATIN SODIUM 20 MG PO TABS
20.0000 mg | ORAL_TABLET | Freq: Every day | ORAL | Status: DC
  Administered 2015-04-04 – 2015-04-06 (×3): 20 mg via ORAL
  Filled 2015-04-03 (×2): qty 1

## 2015-04-03 MED ORDER — FUROSEMIDE 10 MG/ML IJ SOLN
40.0000 mg | Freq: Once | INTRAMUSCULAR | Status: AC
  Administered 2015-04-03: 40 mg via INTRAVENOUS
  Filled 2015-04-03: qty 4

## 2015-04-03 MED ORDER — CARVEDILOL 25 MG PO TABS
25.0000 mg | ORAL_TABLET | Freq: Two times a day (BID) | ORAL | Status: DC
  Administered 2015-04-03 – 2015-04-07 (×8): 25 mg via ORAL
  Filled 2015-04-03 (×8): qty 1

## 2015-04-03 MED ORDER — TAMSULOSIN HCL 0.4 MG PO CAPS
0.4000 mg | ORAL_CAPSULE | Freq: Every day | ORAL | Status: DC
  Administered 2015-04-04 – 2015-04-07 (×4): 0.4 mg via ORAL
  Filled 2015-04-03 (×4): qty 1

## 2015-04-03 MED ORDER — TRAZODONE HCL 100 MG PO TABS
100.0000 mg | ORAL_TABLET | Freq: Two times a day (BID) | ORAL | Status: DC
  Administered 2015-04-03 – 2015-04-07 (×8): 100 mg via ORAL
  Filled 2015-04-03 (×9): qty 1

## 2015-04-03 MED ORDER — OMEGA-3-ACID ETHYL ESTERS 1 G PO CAPS
2.0000 | ORAL_CAPSULE | Freq: Two times a day (BID) | ORAL | Status: DC
  Administered 2015-04-03 – 2015-04-07 (×8): 2 g via ORAL
  Filled 2015-04-03 (×8): qty 2

## 2015-04-03 MED ORDER — ENOXAPARIN SODIUM 40 MG/0.4ML ~~LOC~~ SOLN
40.0000 mg | SUBCUTANEOUS | Status: DC
  Administered 2015-04-03 – 2015-04-06 (×4): 40 mg via SUBCUTANEOUS
  Filled 2015-04-03 (×4): qty 0.4

## 2015-04-03 NOTE — H&P (Signed)
[facility] Admission History and Physical Service Pager: [phone]  Patient name: Patrick [name] Medical record number: [id number] Date of birth: March 17, 1951 Age: 64 y.o. Gender: male  Primary Care Provider: Andrena Mews, MD Consultants: None Code Status: Full code  Chief Complaint: Scrotal edema  Assessment and Plan: Patrick [name] is a 64 y.o. male presenting with CHF exacerbation . PMH is significant for HFrEF  #CHF exacerbation; HFrEF: patient without pulmonary symptoms, but overall fluid overload significant enough to cause scrotal edema. Possibly related to right heart dysfunction, although actual ventricle normal on last echo. Last echo in June 2016 significant for an EF of 14-78%, grade 2 diastolic dysfunction and pulmonary artery pressure of 76mmHg. S3 heard on exam. S/p foley secondary to significant scrotal edema and inability to void well. BNP 980, which is similar to value at last admission 06/18/2014. Dry weight on discharge from last admission is 200lbs. Patient on current admission is up 19 pounds to 219lbs. - Admit to inpatient, med-surg, Dr. Andria Frames admitting physician - Daily weights - In/outs - continue foley catheter - Keep O2 >92% if stars to develop dyspnea. - Continue Coreg 25mg  BID, Losartan 50mg  QD - continue potassium chloride 42meq daily - daily BMP - lasix 40mg  BID  #Scotal swelling: significant where it is causing patient inability to void with decency. No tenderness. Ultrasound unremarkable for testicular pathology. - management per above  #AKI: creatinine above baseline of about 1.2. Creatinine on admission of 1.46. Patient appears volume up. - diurese as above  #Hypertension: slightly elevated on admission to 140s-150s. - continue home meds as above  #Intertrigo: present in groin area - nystatin powder  #CAD - continue statin  #BPH: followed by [facility] - continue home finasteride and tamsulosin   FEN/GI: Heart  healthy, SLIV Prophylaxis: Lovenox  Disposition: Med-surg  History of Present Illness:  Patrick [name] is a 64 y.o. male presenting with scrotal edema. Patient was seen today for a scheduled visit at PCP's office. He was found to have possible lung edema and was sent to the ED for evaluation. He has had scrotal edema for the past two days ago. He has been having an increasing amount of trouble urinating because he could not get his penis out from his swelling. He was recently switched to torsemide which he took once, but discontinued secondary to not being able to urinate well. He had previous mild dyspnea which has improved. He reports not knowing a dry weight. No chest pain, headaches, nausea or vomiting.  In the ED, patient's x-ray significant for no pulmonary edema. Scrotal ultrasound significant for no testicular pathology. He received Lasix 40mg  and foley was placed.  Review Of Systems: Per HPI with the following additions:  Otherwise the remainder of the systems were negative.  Patient Active Problem List   Diagnosis Date Noted  . Scrotal edema 04/03/2015  . CHF (congestive heart failure) ([place]) 04/03/2015  . Finger pain, right 10/03/2014  . Right hip pain 10/03/2014  . Prediabetes 10/03/2014  . Hypokalemia 08/29/2014  . Pedal edema 08/13/2014  . Bilateral leg pain 06/27/2014  . Other iron deficiency anemias   . Mild depression 06/06/2014  . Elevated troponin I level   . Syncope 06/01/2014  . Obesity, unspecified 10/03/2013  . Hyperlipidemia 08/13/2013  . KNEE PAIN, RIGHT 01/26/2010  . CARDIAC MURMUR 01/26/2010  . MACULAR DEGENERATION 05/02/2008  . INSOMNIA 10/02/2007  . HYPOTHYROIDISM 08/21/2007  . SMALL BOWEL OBSTRUCTION, HX OF 08/21/2007  . ERECTILE  DYSFUNCTION 03/13/2007  . Depression 02/06/2007  . Essential hypertension 01/16/2007  . Congestive heart failure ([place]) 01/16/2007  . CHOLELITHIASIS 01/16/2007  . BENIGN PROSTATIC HYPERTROPHY, HX OF 01/16/2007    Past Medical  History: Past Medical History  Diagnosis Date  . CHF (congestive heart failure) (H[facility])     Preserved EF  . Hypertension   . Hyperlipidemia   . Enlarged prostate   . Pneumonia 05/2014  . DM II (diabetes mellitus, type II), controlled ([place])     Diet controlled.   . Degenerative joint disease of knee, right   . Arthritis     "right hand; right knee" (06/19/2014)    Past Surgical History: Past Surgical History  Procedure Laterality Date  . Inguinal hernia repair Left 1990's    Social History: Social History  Substance Use Topics  . Smoking status: Former Smoker    Types: Pipe, Landscape architect  . Smokeless tobacco: Never Used     Comment: 06/19/2014 "stopped smoking in ~ 2014; used to smoke a pipe or cigar a couple times/month"  . Alcohol Use: No   Additional social history: None  Please also refer to relevant sections of EMR.  Family History: Family History  Problem Relation Age of Onset  . Hypertension Mother   . Diabetes Mother   . Stroke Mother   . Cancer Father 17    Prostate  . Dementia Father   . COPD Sister   . Arthritis Sister   . Edema Sister    Allergies and Medications: No Known Allergies No current facility-administered medications on file prior to encounter.   Current Outpatient Prescriptions on File Prior to Encounter  Medication Sig Dispense Refill  . Ascorbic Acid (VITAMIN C) 1000 MG tablet Take 2,000 mg by mouth 2 (two) times daily.    Marland Kitchen aspirin EC 81 MG EC tablet Take 1 tablet (81 mg total) by mouth daily. 30 tablet 0  . BIOFLAVONOIDS PO Take 1 tablet by mouth 2 (two) times daily.    . carvedilol (COREG) 25 MG tablet Take 1 tablet (25 mg total) by mouth 2 (two) times daily. 180 tablet 3  . EQL SENNA LAXATIVE 8.6 MG tablet Take 8.6 mg by mouth 3 (three) times daily. Pt states he rotates between 2 and 3 tablets nightly    . finasteride (PROSCAR) 5 MG tablet TAKE 1 TABLET BY MOUTH DAILY 90 tablet 1  . fluticasone (FLONASE) 50 MCG/ACT nasal spray Place 2 sprays  into both nostrils daily. 16 g 6  . ibuprofen (ADVIL,MOTRIN) 800 MG tablet Take 1 tablet (800 mg total) by mouth every 12 (twelve) hours as needed for headache or moderate pain. 30 tablet 0  . losartan (COZAAR) 50 MG tablet TAKE 1 TABLET BY MOUTH EVERY DAY 90 tablet 1  . lovastatin (MEVACOR) 20 MG tablet TAKE 1 TABLET BY MOUTH AT BEDTIME 90 tablet 1  . Multiple Vitamins-Minerals (MULTIVITAMIN WITH MINERALS) tablet Take 1 tablet by mouth daily.    Marland Kitchen omega-3 acid ethyl esters (LOVAZA) 1 G capsule TAKE 2 CAPSULES BY MOUTH TWICE DAILY 360 capsule 1  . potassium chloride SA (K-DUR,KLOR-CON) 20 MEQ tablet TAKE 2 TABLET BY MOUTH EVERY DAY 180 tablet 1  . senna (SENOKOT) 8.6 MG TABS tablet Take 1 tablet (8.6 mg total) by mouth daily as needed for mild constipation. 30 each 0  . tamsulosin (FLOMAX) 0.4 MG CAPS capsule Take 0.4 mg by mouth daily.     . traMADol (ULTRAM) 50 MG tablet TAKE 1 TABLET BY  MOUTH EVERY 8 HOURS AS NEEDED FOR PAIN 90 tablet 2  . traZODone (DESYREL) 100 MG tablet Take 1 tablet (100 mg total) by mouth 2 (two) times daily. 180 tablet 1  . zoster vaccine live, PF, (ZOSTAVAX) 46503 UNT/0.65ML injection Inject 19,400 Units into the skin once. 1 each 0  . torsemide (DEMADEX) 20 MG tablet Take 3 tablets (60 mg total) by mouth daily. (Patient not taking: Reported on 04/03/2015) 90 tablet 0    Objective: BP 146/77 mmHg  Pulse 63  Temp(Src) 98 F (36.7 C) (Oral)  Resp 16  SpO2 95% Exam: General: Well appearing, no distress Eyes: PERRLA, no conjunctivitis, EOMI ENTM: Throat clear and moist.  Neck: No adenopathy. JVD not assessable Cardiovascular: Regular rate and rhythm, inaudible S1, S2 and S3 present. No discernable murmur. 2+ pedal pulses Respiratory: Clear to auscultation bilaterally Abdomen: Soft, non-tender, obese but not distended. No hepatosplenomegaly MSK: LE edema present up to thighs bilaterally Skin: Scrotum significant edematous with smooth skin. Bilateral inguinal area  with macular, erythematous rash that is non-tender and moist Neuro: No focal deficits, alert, oriented Psych: Full affect  Labs and Imaging: CBC BMET   Recent Labs Lab 04/03/15 1751  WBC 4.9  HGB 10.3*  HCT 34.5*  PLT 124*    Recent Labs Lab 04/03/15 1751  NA 142  K 4.1  CL 98*  CO2 29  BUN 15  CREATININE 1.46*  GLUCOSE 64*  CALCIUM 9.4     BNP    Component Value Date/Time   BNP 980.2* 04/03/2015 1751   Dg Chest 2 View  04/03/2015  CLINICAL DATA:  64 year old presenting with 1 month history of progressive swelling in the scrotum and the upper legs. Progressive shortness of breath over the past 1 week. EXAM: CHEST  2 VIEW COMPARISON:  06/18/2014 and earlier, including CTA chest 06/01/2014. FINDINGS: AP and lateral images were obtained. Suboptimal inspiration. Cardiac silhouette moderately enlarged, unchanged. Hilar and mediastinal contours otherwise unremarkable. Mild pulmonary venous hypertension without overt edema. Lungs otherwise clear. No localized airspace consolidation. No pleural effusions. No pneumothorax. Osseous demineralization and mild degenerative changes involving the thoracic spine. IMPRESSION: Stable cardiomegaly.  No acute cardiopulmonary disease. Electronically Signed   By: Evangeline Dakin M.D.   On: 04/03/2015 18:49   US Scrotum  04/03/2015  CLINICAL DATA:  Scrotal swelling. EXAM: SCROTAL ULTRASOUND DOPPLER ULTRASOUND OF THE TESTICLES COMPARISON:  Comparison made to prior CT of 09/30/2012. TECHNIQUE: Complete ultrasound examination of the testicles, epididymis, and other scrotal structures was performed. Color and spectral Doppler ultrasound were also utilized to evaluate blood flow to the testicles. FINDINGS: Right testicle Measurements: 4.3 x 2.9 x 2.8 cm. No mass or microlithiasis visualized. Left testicle Measurements: 4.4 x 2.9 x 3.0 cm. No mass or microlithiasis visualized. Right epididymis:  Normal in size and appearance. Left epididymis:  Normal in  size and appearance. Hydrocele: Bilateral hydroceles. Moderate scrotal edema noted bilaterally. Varicocele:  None visualized. Pulsed Doppler interrogation of both testes demonstrates normal low resistance arterial and venous waveforms bilaterally. IMPRESSION: 1. No evidence of testicular mass or torsion. 2. Bilateral moderate-sized hydroceles with moderate scrotal edema. Electronically Signed   By: Marcello Moores  Register   On: 04/03/2015 16:48   [place] Art/ven Flow Abd Pelv Doppler  04/03/2015  CLINICAL DATA:  Scrotal swelling. EXAM: SCROTAL ULTRASOUND DOPPLER ULTRASOUND OF THE TESTICLES COMPARISON:  Comparison made to prior CT of 09/30/2012. TECHNIQUE: Complete ultrasound examination of the testicles, epididymis, and other scrotal structures was performed. Color and spectral Doppler  ultrasound were also utilized to evaluate blood flow to the testicles. FINDINGS: Right testicle Measurements: 4.3 x 2.9 x 2.8 cm. No mass or microlithiasis visualized. Left testicle Measurements: 4.4 x 2.9 x 3.0 cm. No mass or microlithiasis visualized. Right epididymis:  Normal in size and appearance. Left epididymis:  Normal in size and appearance. Hydrocele: Bilateral hydroceles. Moderate scrotal edema noted bilaterally. Varicocele:  None visualized. Pulsed Doppler interrogation of both testes demonstrates normal low resistance arterial and venous waveforms bilaterally. IMPRESSION: 1. No evidence of testicular mass or torsion. 2. Bilateral moderate-sized hydroceles with moderate scrotal edema. Electronically Signed   By: Marcello Moores  Register   On: 04/03/2015 16:48    Mariel Aloe, MD 04/03/2015, 8:35 PM PGY-3, [facility] pager: [phone], text pages welcome

## 2015-04-03 NOTE — Assessment & Plan Note (Signed)
Not in exacerbation, although still having SOB. He might also be retaining fluid in her lungs due to bibasal crackles heard. His pedal swelling seems a bit worse than the last time I saw him. I recommended chest xray in the ED although IV lasix should help with this as well.

## 2015-04-03 NOTE — ED Notes (Signed)
Pt sts scrotal and upper leg edema x 1 month that has gotten so severe that his penis is not able to be seen any more

## 2015-04-03 NOTE — Patient Instructions (Signed)
Scrotal Swelling Scrotal swelling may occur on one or both sides of the scrotum. Pain may also occur with swelling. Possible causes of scrotal swelling include:   Injury.  Infection.  An ingrown hair or abrasion in the area.  Repeated rubbing from tight-fitting underwear.  Poor hygiene.  A weakened area in the muscles around the groin (hernia). A hernia can allow abdominal contents to push into the scrotum.  Fluid around the testicle (hydrocele).  Enlarged vein around the testicle (varicocele).  Certain medical treatments or existing conditions.  A recent genital surgery or procedure.  The spermatic cord becomes twisted in the scrotum, which cuts off blood supply (testicular torsion).  Testicular cancer. HOME CARE INSTRUCTIONS Once the cause of your scrotal swelling has been determined, you may be asked to monitor your scrotum for any changes. The following actions may help to alleviate any discomfort you are experiencing:  Rest and limit activity until the swelling goes away. Lying down is the preferred position.  Put ice on the scrotum:  Put ice in a plastic bag.  Place a towel between your skin and the bag.  Leave the ice on for 20 minutes, 2-3 times a day for 1-2 days.  Place a rolled towel under the testicles for support.  Wear loose-fitting clothing or an athletic support cup for comfort.  Take all medicines as directed by your health care provider.  Perform a monthly self-exam of the scrotum and penis. Feel for changes. Ask your health care provider how to perform a monthly self-exam if you are unsure. SEEK MEDICAL CARE IF:  You have a sudden (acute) onset of pain that is persistent and not improving.  You notice a heavy feeling or fluid in the scrotum.  You have pain or burning while urinating.  You have blood in the urine or semen.  You feel a lump around the testicle.  You notice that one testicle is larger than the other (slight variation is  normal).  You have a persistent dull ache or pain in the groin or scrotum. SEEK IMMEDIATE MEDICAL CARE IF:  The pain does not go away or becomes severe.  You have a fever or shaking chills.  You have pain or vomiting that cannot be controlled.  You notice significant redness or swelling of one or both sides of the scrotum.  You experience redness spreading upward from your scrotum to your abdomen or downward from your scrotum to your thighs. MAKE SURE YOU:  Understand these instructions.  Will watch your condition.  Will get help right away if you are not doing well or get worse.   This information is not intended to replace advice given to you by your health care provider. Make sure you discuss any questions you have with your health care provider.   Document Released: 06/18/2010 Document Revised: 01/16/2013 Document Reviewed: 10/18/2012 Elsevier Interactive Patient Education [employer].

## 2015-04-03 NOTE — ED Notes (Signed)
Bladder scan = 334ml  [facility] RN informed.

## 2015-04-03 NOTE — Progress Notes (Signed)
Subjective:     Patient ID: Patrick [name], male   DOB: 1951/05/10, 64 y.o.   MRN: [id number]  HPI Scrotal swelling: Here to follow up on his scrotal swelling which started about 1 month. He saw his urologist recently who felt it was just all fluid collection. He does have scrotal pain because of the swelling. No redness. No fever. He stopped taking his water pills last week since his penis disappeared inside his scrotum due to the swelling. He has been having involuntary urination.  CHF: He is having just slight worsening SOB over the last few days or week. No chest pain. No cough. He denies his leg swelling worsening, he felt it is about the same.  Current Outpatient Prescriptions on File Prior to Visit  Medication Sig Dispense Refill  . carvedilol (COREG) 25 MG tablet Take 1 tablet (25 mg total) by mouth 2 (two) times daily. 180 tablet 3  . finasteride (PROSCAR) 5 MG tablet TAKE 1 TABLET BY MOUTH DAILY 90 tablet 1  . losartan (COZAAR) 50 MG tablet TAKE 1 TABLET BY MOUTH EVERY DAY 90 tablet 1  . lovastatin (MEVACOR) 20 MG tablet TAKE 1 TABLET BY MOUTH AT BEDTIME 90 tablet 1  . potassium chloride SA (K-DUR,KLOR-CON) 20 MEQ tablet TAKE 2 TABLET BY MOUTH EVERY DAY 180 tablet 1  . tamsulosin (FLOMAX) 0.4 MG CAPS capsule Take 0.4 mg by mouth daily.     . traMADol (ULTRAM) 50 MG tablet TAKE 1 TABLET BY MOUTH EVERY 8 HOURS AS NEEDED FOR PAIN 90 tablet 2  . traZODone (DESYREL) 100 MG tablet Take 1 tablet (100 mg total) by mouth 2 (two) times daily. 180 tablet 1  . zoster vaccine live, PF, (ZOSTAVAX) 76720 UNT/0.65ML injection Inject 19,400 Units into the skin once. 1 each 0  . Ascorbic Acid (VITAMIN C) 1000 MG tablet Take 2,000 mg by mouth 2 (two) times daily.    Marland Kitchen aspirin EC 81 MG EC tablet Take 1 tablet (81 mg total) by mouth daily. 30 tablet 0  . BIOFLAVONOIDS PO Take 1 tablet by mouth 2 (two) times daily.    Marland Kitchen EQL SENNA LAXATIVE 8.6 MG tablet Take 8.6 mg by mouth 3 (three) times daily. Pt states he  rotates between 2 and 3 tablets nightly    . fluticasone (FLONASE) 50 MCG/ACT nasal spray Place 2 sprays into both nostrils daily. 16 g 6  . ibuprofen (ADVIL,MOTRIN) 800 MG tablet Take 1 tablet (800 mg total) by mouth every 12 (twelve) hours as needed for headache or moderate pain. 30 tablet 0  . Multiple Vitamins-Minerals (MULTIVITAMIN WITH MINERALS) tablet Take 1 tablet by mouth daily.    Marland Kitchen omega-3 acid ethyl esters (LOVAZA) 1 G capsule TAKE 2 CAPSULES BY MOUTH TWICE DAILY 360 capsule 1  . senna (SENOKOT) 8.6 MG TABS tablet Take 1 tablet (8.6 mg total) by mouth daily as needed for mild constipation. 30 each 0  . torsemide (DEMADEX) 20 MG tablet Take 3 tablets (60 mg total) by mouth daily. (Patient not taking: Reported on 04/03/2015) 90 tablet 0   No current facility-administered medications on file prior to visit.   Past Medical History  Diagnosis Date  . CHF (congestive heart failure) (H[facility])     Preserved EF  . Hypertension   . Hyperlipidemia   . Enlarged prostate   . Pneumonia 05/2014  . DM II (diabetes mellitus, type II), controlled ([place])     Diet controlled.   . Degenerative joint disease of knee,  right   . Arthritis     "right hand; right knee" (06/19/2014)     Review of Systems  Respiratory: Positive for shortness of breath. Negative for cough and chest tightness.   Cardiovascular: Positive for leg swelling. Negative for chest pain and palpitations.  Genitourinary: Positive for penile swelling, scrotal swelling and difficulty urinating. Negative for dysuria, urgency, hematuria, flank pain, discharge, penile pain and testicular pain.  All other systems reviewed and are negative.      Filed Vitals:   04/03/15 1008  BP: 139/84  Pulse: 63  Temp: 98.4 F (36.9 C)  Weight: 226 lb (102.513 kg)    Objective:   Physical Exam  Constitutional: He appears well-developed. No distress.  Cardiovascular: Normal rate, regular rhythm, normal heart sounds and intact distal pulses.     No murmur heard. ++ bibasal crackles more on the left.  Pulmonary/Chest: Effort normal and breath sounds normal. No respiratory distress. He has no wheezes. He exhibits no tenderness.  Abdominal: Soft. Bowel sounds are normal. He exhibits no distension and no mass. There is no tenderness.  Genitourinary:    Right testis shows swelling. Right testis shows no tenderness. Left testis shows swelling. Left testis shows no tenderness.  Musculoskeletal: He exhibits edema.  B/L pitting pedal edema up to his knees  Nursing note and vitals reviewed.      Assessment:     Scrotal edema HTN     Plan:     Check problem list.      Calla Kicks

## 2015-04-03 NOTE — ED Notes (Signed)
Admit Doctor at bedside.  

## 2015-04-03 NOTE — Assessment & Plan Note (Signed)
Progressively worsening scrotal edema. He d/c diuretic 3 days ago since he could not see his penis and he is worried he will be unable to urinate hence he self d/c med. He is scheduled for scrotal U/S today. I recommended direct hospital admission for catheterization and IV diuresis but he declined,he was crying because there is no one home with his sister. I then recommended ED assessment where he can get catheterized and get IV lasix for some time and if he improves they can send him home from the ED. He agreed with plan. I recommended that he gets his scrotal U/S done at the ED since he will not be able to meet his 11:30 appointment. He will inform ED attending about this. I suspect the ED attending will also read my note. I spoke with charge nurse Lenna Sciara) from the ED and discussed patient care transfer. Patient will see me back in 1-2 wks after discharge.

## 2015-04-03 NOTE — ED Provider Notes (Signed)
CSN: [id number]     Arrival date & time 04/03/15  1123 History   First MD Initiated Contact with Patient 04/03/15 1223     Chief Complaint  Patient presents with  . Groin Swelling    HPI   Patrick [name] is a 64 yo M PMH CHF, HTN, hyperlipidemia, enlarged prostate pw swollen scrotum x 6 weeks. Over the last 3 days, his "scrotum has become so large my penis won't come out." Patient is having trouble voiding (unable to locate penis). He has been off of his Lasix Since Tuesday because he does not want to have to void more. He endorses scrotal redness, warmth, tenderness. No fever, chills, drainage, abdominal pain, new sexual partners, injury. Last urine 04:00 this morning.   Past Medical History  Diagnosis Date  . CHF (congestive heart failure) (HCC)     Preserved EF  . Hypertension   . Hyperlipidemia   . Enlarged prostate   . Pneumonia 05/2014  . DM II (diabetes mellitus, type II), controlled ([place])     Diet controlled.   . Degenerative joint disease of knee, right   . Arthritis     "right hand; right knee" (06/19/2014)   Past Surgical History  Procedure Laterality Date  . Inguinal hernia repair Left 65's   Family History  Problem Relation Age of Onset  . Hypertension Mother   . Diabetes Mother   . Stroke Mother   . Cancer Father 67    Prostate  . Dementia Father   . COPD Sister   . Arthritis Sister   . Edema Sister    Social History  Substance Use Topics  . Smoking status: Former Smoker    Types: Pipe, Landscape architect  . Smokeless tobacco: Never Used     Comment: 06/19/2014 "stopped smoking in ~ 2014; used to smoke a pipe or cigar a couple times/month"  . Alcohol Use: No    Review of Systems  Ten systems are reviewed and are negative for acute change except as noted in the HPI   Allergies  Review of patient's allergies indicates no known allergies.  Home Medications   Prior to Admission medications   Medication Sig Start Date End Date Taking? Authorizing Provider   Ascorbic Acid (VITAMIN C) 1000 MG tablet Take 2,000 mg by mouth 2 (two) times daily.    Historical Provider, MD  aspirin EC 81 MG EC tablet Take 1 tablet (81 mg total) by mouth daily. 06/03/14   [place] N Rumley, DO  BIOFLAVONOIDS PO Take 1 tablet by mouth 2 (two) times daily.    Historical Provider, MD  carvedilol (COREG) 25 MG tablet Take 1 tablet (25 mg total) by mouth 2 (two) times daily. 10/24/14   Minus Breeding, MD  EQL SENNA LAXATIVE 8.6 MG tablet Take 8.6 mg by mouth 3 (three) times daily. Pt states he rotates between 2 and 3 tablets nightly 12/09/14   Historical Provider, MD  finasteride (PROSCAR) 5 MG tablet TAKE 1 TABLET BY MOUTH DAILY 10/28/14   Kinnie Feil, MD  fluticasone (FLONASE) 50 MCG/ACT nasal spray Place 2 sprays into both nostrils daily. 05/20/14   Hilton Sinclair, MD  ibuprofen (ADVIL,MOTRIN) 800 MG tablet Take 1 tablet (800 mg total) by mouth every 12 (twelve) hours as needed for headache or moderate pain. 03/23/15   Kinnie Feil, MD  losartan (COZAAR) 50 MG tablet TAKE 1 TABLET BY MOUTH EVERY DAY 03/05/15   Kinnie Feil, MD  lovastatin (MEVACOR) 20 MG  tablet TAKE 1 TABLET BY MOUTH AT BEDTIME 10/28/14   Kinnie Feil, MD  Multiple Vitamins-Minerals (MULTIVITAMIN WITH MINERALS) tablet Take 1 tablet by mouth daily.    Historical Provider, MD  omega-3 acid ethyl esters (LOVAZA) 1 G capsule TAKE 2 CAPSULES BY MOUTH TWICE DAILY 03/05/15   Kinnie Feil, MD  potassium chloride SA (K-DUR,KLOR-CON) 20 MEQ tablet TAKE 2 TABLET BY MOUTH EVERY DAY 02/24/15   Minus Breeding, MD  senna (SENOKOT) 8.6 MG TABS tablet Take 1 tablet (8.6 mg total) by mouth daily as needed for mild constipation. 06/03/14   [place] N Rumley, DO  tamsulosin (FLOMAX) 0.4 MG CAPS capsule Take 0.4 mg by mouth daily.     Historical Provider, MD  torsemide (DEMADEX) 20 MG tablet Take 3 tablets (60 mg total) by mouth daily. Patient not taking: Reported on 04/03/2015 03/30/15   Leone Brand, MD  traMADol  (ULTRAM) 50 MG tablet TAKE 1 TABLET BY MOUTH EVERY 8 HOURS AS NEEDED FOR PAIN 01/14/15   Kinnie Feil, MD  traZODone (DESYREL) 100 MG tablet Take 1 tablet (100 mg total) by mouth 2 (two) times daily. 11/04/14   Kinnie Feil, MD  zoster vaccine live, PF, (ZOSTAVAX) 26834 UNT/0.65ML injection Inject 19,400 Units into the skin once. 02/06/15   Kinnie Feil, MD   BP 164/82 mmHg  Pulse 51  Temp(Src) 98 F (36.7 C) (Oral)  Resp 18  SpO2 98% Physical Exam  Constitutional: He appears well-developed and well-nourished. No distress.  HENT:  Head: Normocephalic and atraumatic.  Mouth/Throat: Oropharynx is clear and moist. No oropharyngeal exudate.  Eyes: Conjunctivae are normal. Pupils are equal, round, and reactive to light. Right eye exhibits no discharge. Left eye exhibits no discharge. No scleral icterus.  Neck: No tracheal deviation present.  Cardiovascular: Normal rate, regular rhythm, normal heart sounds and intact distal pulses.  Exam reveals no gallop and no friction rub.   No murmur heard. Pulmonary/Chest: Effort normal and breath sounds normal. No respiratory distress. He has no wheezes. He has no rales. He exhibits no tenderness.  Abdominal: Soft. Bowel sounds are normal. He exhibits no distension and no mass. There is no tenderness. There is no rebound and no guarding.  Genitourinary:  Unable to visualize penis. Scrotal sac edematous and erythematous.   Musculoskeletal: Normal range of motion. He exhibits no edema.  Neurological: He is alert. Coordination normal.  Skin: Skin is warm and dry. Rash noted. He is not diaphoretic.  Erythematous macular satellite lesions present bilateral groin.  Psychiatric: He has a normal mood and affect. His behavior is normal.  Nursing note and vitals reviewed.   ED Course  Procedures (including critical care time) Labs Review Labs Reviewed  I-STAT CHEM 8, ED - Abnormal; Notable for the following:    Chloride 100 (*)    Creatinine, Ser  1.50 (*)    Hemoglobin 11.6 (*)    HCT 34.0 (*)    All other components within normal limits  URINALYSIS, ROUTINE W REFLEX MICROSCOPIC (NOT AT [facility])    Imaging Review Dg Chest 2 View  04/03/2015  CLINICAL DATA:  64 year old presenting with 1 month history of progressive swelling in the scrotum and the upper legs. Progressive shortness of breath over the past 1 week. EXAM: CHEST  2 VIEW COMPARISON:  06/18/2014 and earlier, including CTA chest 06/01/2014. FINDINGS: AP and lateral images were obtained. Suboptimal inspiration. Cardiac silhouette moderately enlarged, unchanged. Hilar and mediastinal contours otherwise unremarkable. Mild pulmonary venous hypertension  without overt edema. Lungs otherwise clear. No localized airspace consolidation. No pleural effusions. No pneumothorax. Osseous demineralization and mild degenerative changes involving the thoracic spine. IMPRESSION: Stable cardiomegaly.  No acute cardiopulmonary disease. Electronically Signed   By: Evangeline Dakin M.D.   On: 04/03/2015 18:49   US Scrotum  04/03/2015  CLINICAL DATA:  Scrotal swelling. EXAM: SCROTAL ULTRASOUND DOPPLER ULTRASOUND OF THE TESTICLES COMPARISON:  Comparison made to prior CT of 09/30/2012. TECHNIQUE: Complete ultrasound examination of the testicles, epididymis, and other scrotal structures was performed. Color and spectral Doppler ultrasound were also utilized to evaluate blood flow to the testicles. FINDINGS: Right testicle Measurements: 4.3 x 2.9 x 2.8 cm. No mass or microlithiasis visualized. Left testicle Measurements: 4.4 x 2.9 x 3.0 cm. No mass or microlithiasis visualized. Right epididymis:  Normal in size and appearance. Left epididymis:  Normal in size and appearance. Hydrocele: Bilateral hydroceles. Moderate scrotal edema noted bilaterally. Varicocele:  None visualized. Pulsed Doppler interrogation of both testes demonstrates normal low resistance arterial and venous waveforms bilaterally. IMPRESSION: 1. No  evidence of testicular mass or torsion. 2. Bilateral moderate-sized hydroceles with moderate scrotal edema. Electronically Signed   By: Marcello Moores  Register   On: 04/03/2015 16:48   [place] Art/ven Flow Abd Pelv Doppler  04/03/2015  CLINICAL DATA:  Scrotal swelling. EXAM: SCROTAL ULTRASOUND DOPPLER ULTRASOUND OF THE TESTICLES COMPARISON:  Comparison made to prior CT of 09/30/2012. TECHNIQUE: Complete ultrasound examination of the testicles, epididymis, and other scrotal structures was performed. Color and spectral Doppler ultrasound were also utilized to evaluate blood flow to the testicles. FINDINGS: Right testicle Measurements: 4.3 x 2.9 x 2.8 cm. No mass or microlithiasis visualized. Left testicle Measurements: 4.4 x 2.9 x 3.0 cm. No mass or microlithiasis visualized. Right epididymis:  Normal in size and appearance. Left epididymis:  Normal in size and appearance. Hydrocele: Bilateral hydroceles. Moderate scrotal edema noted bilaterally. Varicocele:  None visualized. Pulsed Doppler interrogation of both testes demonstrates normal low resistance arterial and venous waveforms bilaterally. IMPRESSION: 1. No evidence of testicular mass or torsion. 2. Bilateral moderate-sized hydroceles with moderate scrotal edema. Electronically Signed   By: Marcello Moores  Register   On: 04/03/2015 16:48   I have personally reviewed and evaluated these images and lab results as part of my medical decision-making.   MDM   Final diagnoses:  Scrotal swelling   Ordered IV Lasix, US scrotum.   Bladder scan demonstrates 360 mL. Will attempt in and out cath. RN states in and out cath will be very difficult. Will assist as needed.   Serum creatinine elevated at 1.5 (1.33 three weeks ago).   Assisted with cath. Will work up for CHF.  Dr. Gerald Leitz assigned at supervising physician at this time. She placed admission orders. Discussed plan with patient who is understanding and in agreement.   [place] Lions, PA-C 04/09/15  [place], MD 04/10/15 [phone]

## 2015-04-04 DIAGNOSIS — I5023 Acute on chronic systolic (congestive) heart failure: Secondary | ICD-10-CM | POA: Insufficient documentation

## 2015-04-04 DIAGNOSIS — N139 Obstructive and reflux uropathy, unspecified: Secondary | ICD-10-CM

## 2015-04-04 DIAGNOSIS — N5089 Other specified disorders of the male genital organs: Secondary | ICD-10-CM | POA: Insufficient documentation

## 2015-04-04 LAB — IRON AND TIBC
Iron: 23 ug/dL — ABNORMAL LOW (ref 45–182)
SATURATION RATIOS: 6 % — AB (ref 17.9–39.5)
TIBC: 378 ug/dL (ref 250–450)
UIBC: 355 ug/dL

## 2015-04-04 LAB — BASIC METABOLIC PANEL
ANION GAP: 12 (ref 5–15)
BUN: 16 mg/dL (ref 6–20)
CHLORIDE: 102 mmol/L (ref 101–111)
CO2: 27 mmol/L (ref 22–32)
Calcium: 8.8 mg/dL — ABNORMAL LOW (ref 8.9–10.3)
Creatinine, Ser: 1.43 mg/dL — ABNORMAL HIGH (ref 0.61–1.24)
GFR calc Af Amer: 57 mL/min — ABNORMAL LOW (ref >60)
GFR calc non Af Amer: 50 mL/min — ABNORMAL LOW (ref >60)
GLUCOSE: 74 mg/dL (ref 65–99)
POTASSIUM: 3.5 mmol/L (ref 3.5–5.1)
Sodium: 141 mmol/L (ref 135–145)

## 2015-04-04 LAB — CBC
HEMATOCRIT: 33.6 % — AB (ref 39.0–52.0)
HEMOGLOBIN: 9.7 g/dL — AB (ref 13.0–17.0)
MCH: 21 pg — AB (ref 26.0–34.0)
MCHC: 28.9 g/dL — AB (ref 30.0–36.0)
MCV: 72.6 fL — AB (ref 78.0–100.0)
Platelets: 149 10*3/uL — ABNORMAL LOW (ref 150–400)
RBC: 4.63 MIL/uL (ref 4.22–5.81)
RDW: 18.6 % — ABNORMAL HIGH (ref 11.5–15.5)
WBC: 5.7 10*3/uL (ref 4.0–10.5)

## 2015-04-04 LAB — MAGNESIUM: MAGNESIUM: 2.1 mg/dL (ref 1.7–2.4)

## 2015-04-04 LAB — FERRITIN: Ferritin: 14 ng/mL — ABNORMAL LOW (ref 24–336)

## 2015-04-04 MED ORDER — FUROSEMIDE 10 MG/ML IJ SOLN
40.0000 mg | Freq: Two times a day (BID) | INTRAMUSCULAR | Status: DC
  Administered 2015-04-04 – 2015-04-06 (×6): 40 mg via INTRAVENOUS
  Filled 2015-04-04 (×6): qty 4

## 2015-04-04 MED ORDER — NYSTATIN 100000 UNIT/GM EX POWD
Freq: Two times a day (BID) | CUTANEOUS | Status: DC
  Administered 2015-04-04 – 2015-04-07 (×8): via TOPICAL
  Filled 2015-04-04: qty 15

## 2015-04-04 NOTE — Progress Notes (Signed)
Family Medicine Teaching Service Daily Progress Note Intern Pager: [phone]  Patient name: Patrick [name] Medical record number: [id number] Date of birth: 01/01/1951 Age: 64 y.o. Gender: male  Primary Care Provider: Andrena Mews, MD Consultants: None Code Status: Full code  Pt Overview and Major Events to Date:  11/4: Patient admitted. Wt 2200lbs 11/5: Wt 216lbs, UOP 3.725L  Assessment and Plan: Patrick [name] is a 64 y.o. male presenting with CHF exacerbation . PMH is significant for HFrEF  #CHF exacerbation; HFrEF: Patient has lost about 3lbs since admission down to 216lbs. Respiratory status is stable. He has urinated 3.725L in the last 24 hours. - Daily weights - In/outs - continue foley catheter - Keep O2 >92% if stars to develop dyspnea. - Continue Coreg 25mg  BID, Losartan 50mg  QD - continue potassium chloride 72meq daily - daily BMP - lasix 40mg  BID - follow-up magnesium  #Scotal swelling: significant where it is causing patient inability to void with decency. No tenderness. Ultrasound unremarkable for testicular pathology. - management per above  #AKI: creatinine improved to 1.43 from 1.5 on admission. Patient appears volume up. - diurese as above  #Microcystic anemia: hemoglobin trending down to 9.7 today from recent baseline around 12 - Ferritin, iron TIBC  #Thrombocytopenia: Platelets 124 on admission. Improved to 149 - follow CBC  #Hypertension: slightly elevated on admission to 140s-150s. - continue home meds as above  #Intertrigo: present in groin area - nystatin powder  #CAD - continue statin  #BPH: followed by [facility] - continue home finasteride and tamsulosin   FEN/GI: Heart healthy, SLIV Prophylaxis: Lovenox  Disposition: pending continued diureses  Subjective:  Patient reports no chest pain or dyspnea overnight. No complaints this morning. He feels his scrotum is decreasing  Objective: Temp:  [97.5 F (36.4 C)-98.4 F (36.9 C)]  97.9 F (36.6 C) (11/05 0536) Pulse Rate:  [51-73] 69 (11/05 0536) Resp:  [16-20] 18 (11/05 0536) BP: (131-164)/(72-94) 150/79 mmHg (11/05 0536) SpO2:  [92 %-98 %] 94 % (11/05 0536) Weight:  [216 lb 7.9 oz (98.2 kg)-226 lb (102.513 kg)] 216 lb 7.9 oz (98.2 kg) (11/05 0536)  Physical Exam: General: Well appearing, no distress Cardiovascular: Regular rate and rhythm, S1, S2 audible, S3 audible. No murmur Respiratory: Clear to auscultation bilaterally Abdomen: Soft, obese, non-tender Extremities: Pitting edema up to knees bilaterally, no tenderness  Laboratory:  Recent Labs Lab 04/03/15 1431 04/03/15 1751 04/04/15 0432  WBC  --  4.9 5.7  HGB 11.6* 10.3* 9.7*  HCT 34.0* 34.5* 33.6*  PLT  --  124* PENDING    Recent Labs Lab 04/03/15 1431 04/03/15 1751 04/04/15 0432  NA 140 142 141  K 4.4 4.1 3.5  CL 100* 98* 102  CO2  --  29 27  BUN 18 15 16   CREATININE 1.50* 1.46* 1.43*  CALCIUM  --  9.4 8.8*  PROT  --  6.4*  --   BILITOT  --  1.9*  --   ALKPHOS  --  109  --   ALT  --  13*  --   AST  --  26  --   GLUCOSE 69 64* 74    Imaging/Diagnostic Tests: Dg Chest 2 View  04/03/2015  CLINICAL DATA:  64 year old presenting with 1 month history of progressive swelling in the scrotum and the upper legs. Progressive shortness of breath over the past 1 week. EXAM: CHEST  2 VIEW COMPARISON:  06/18/2014 and earlier, including CTA chest 06/01/2014. FINDINGS: AP and lateral images were obtained.  Suboptimal inspiration. Cardiac silhouette moderately enlarged, unchanged. Hilar and mediastinal contours otherwise unremarkable. Mild pulmonary venous hypertension without overt edema. Lungs otherwise clear. No localized airspace consolidation. No pleural effusions. No pneumothorax. Osseous demineralization and mild degenerative changes involving the thoracic spine. IMPRESSION: Stable cardiomegaly.  No acute cardiopulmonary disease. Electronically Signed   By: Evangeline Dakin M.D.   On: 04/03/2015  18:49   US Scrotum  04/03/2015  CLINICAL DATA:  Scrotal swelling. EXAM: SCROTAL ULTRASOUND DOPPLER ULTRASOUND OF THE TESTICLES COMPARISON:  Comparison made to prior CT of 09/30/2012. TECHNIQUE: Complete ultrasound examination of the testicles, epididymis, and other scrotal structures was performed. Color and spectral Doppler ultrasound were also utilized to evaluate blood flow to the testicles. FINDINGS: Right testicle Measurements: 4.3 x 2.9 x 2.8 cm. No mass or microlithiasis visualized. Left testicle Measurements: 4.4 x 2.9 x 3.0 cm. No mass or microlithiasis visualized. Right epididymis:  Normal in size and appearance. Left epididymis:  Normal in size and appearance. Hydrocele: Bilateral hydroceles. Moderate scrotal edema noted bilaterally. Varicocele:  None visualized. Pulsed Doppler interrogation of both testes demonstrates normal low resistance arterial and venous waveforms bilaterally. IMPRESSION: 1. No evidence of testicular mass or torsion. 2. Bilateral moderate-sized hydroceles with moderate scrotal edema. Electronically Signed   By: Marcello Moores  Register   On: 04/03/2015 16:48   [place] Art/ven Flow Abd Pelv Doppler  04/03/2015  CLINICAL DATA:  Scrotal swelling. EXAM: SCROTAL ULTRASOUND DOPPLER ULTRASOUND OF THE TESTICLES COMPARISON:  Comparison made to prior CT of 09/30/2012. TECHNIQUE: Complete ultrasound examination of the testicles, epididymis, and other scrotal structures was performed. Color and spectral Doppler ultrasound were also utilized to evaluate blood flow to the testicles. FINDINGS: Right testicle Measurements: 4.3 x 2.9 x 2.8 cm. No mass or microlithiasis visualized. Left testicle Measurements: 4.4 x 2.9 x 3.0 cm. No mass or microlithiasis visualized. Right epididymis:  Normal in size and appearance. Left epididymis:  Normal in size and appearance. Hydrocele: Bilateral hydroceles. Moderate scrotal edema noted bilaterally. Varicocele:  None visualized. Pulsed Doppler interrogation of both  testes demonstrates normal low resistance arterial and venous waveforms bilaterally. IMPRESSION: 1. No evidence of testicular mass or torsion. 2. Bilateral moderate-sized hydroceles with moderate scrotal edema. Electronically Signed   By: Marcello Moores  Register   On: 04/03/2015 16:48    Mariel Aloe, MD 04/04/2015, 7:32 AM PGY-3, [place] Intern pager: [phone], text pages welcome

## 2015-04-05 LAB — CBC
HCT: 32.3 % — ABNORMAL LOW (ref 39.0–52.0)
HEMOGLOBIN: 9.4 g/dL — AB (ref 13.0–17.0)
MCH: 21.1 pg — AB (ref 26.0–34.0)
MCHC: 29.1 g/dL — AB (ref 30.0–36.0)
MCV: 72.6 fL — ABNORMAL LOW (ref 78.0–100.0)
PLATELETS: 120 10*3/uL — AB (ref 150–400)
RBC: 4.45 MIL/uL (ref 4.22–5.81)
RDW: 18.6 % — ABNORMAL HIGH (ref 11.5–15.5)
WBC: 6.5 10*3/uL (ref 4.0–10.5)

## 2015-04-05 LAB — BASIC METABOLIC PANEL
Anion gap: 12 (ref 5–15)
BUN: 17 mg/dL (ref 6–20)
CHLORIDE: 103 mmol/L (ref 101–111)
CO2: 28 mmol/L (ref 22–32)
CREATININE: 1.42 mg/dL — AB (ref 0.61–1.24)
Calcium: 8.4 mg/dL — ABNORMAL LOW (ref 8.9–10.3)
GFR, EST AFRICAN AMERICAN: 58 mL/min — AB (ref >60)
GFR, EST NON AFRICAN AMERICAN: 50 mL/min — AB (ref >60)
Glucose, Bld: 80 mg/dL (ref 65–99)
Potassium: 2.8 mmol/L — ABNORMAL LOW (ref 3.5–5.1)
SODIUM: 143 mmol/L (ref 135–145)

## 2015-04-05 MED ORDER — FERROUS SULFATE 325 (65 FE) MG PO TABS
325.0000 mg | ORAL_TABLET | Freq: Every day | ORAL | Status: DC
  Administered 2015-04-06 – 2015-04-07 (×2): 325 mg via ORAL
  Filled 2015-04-05 (×2): qty 1

## 2015-04-05 MED ORDER — SENNA 8.6 MG PO TABS
1.0000 | ORAL_TABLET | Freq: Every day | ORAL | Status: DC | PRN
  Administered 2015-04-05: 8.6 mg via ORAL
  Filled 2015-04-05: qty 1

## 2015-04-05 MED ORDER — POTASSIUM CHLORIDE CRYS ER 20 MEQ PO TBCR
30.0000 meq | EXTENDED_RELEASE_TABLET | Freq: Three times a day (TID) | ORAL | Status: AC
  Administered 2015-04-05 – 2015-04-06 (×4): 30 meq via ORAL
  Filled 2015-04-05 (×8): qty 1

## 2015-04-05 NOTE — Progress Notes (Signed)
Utilization Review Completed.Patrick [name] T11/10/2014  

## 2015-04-05 NOTE — Progress Notes (Signed)
FPTS Interim Progress Note  Paged by nursing about BRBPR noted when patient had a BM. Per nursing there is not a substantial amount of blood. Patient did not appear concerned due to history of hemorrhoids. I have asked nursing to monitor for continued bleeding.  - Will reassess with morning CBC. - Will consults GI if symptoms worsen or patient becomes tachycardic or hypotensive.  Elberta Leatherwood, MD 04/05/2015, 8:03 PM PGY-2, [place] Medicine Service pager [phone]

## 2015-04-05 NOTE — Evaluation (Signed)
Physical Therapy Evaluation Patient Details Name: Patrick [name] MRN: [id number] DOB: May 21, 1951 Today's Date: 04/05/2015   History of Present Illness  Pt is a 64 y/o M presenting w/ a CHF exacerbation.  Pt's PMH includes scrotal edema, anemia, Rt DJD.  Clinical Impression  Pt admitted with above diagnosis. Pt currently with functional limitations due to the deficits listed below (see PT Problem List). Patrick [name] lives at home w/ his sister whom he is the primary caregiver for.  He will need HHPT at d/c to address balance impairments w/ ambulation.  Ideally pt's sister should have [place] aide to assist at home as pt at increased risk for injury while assisting his sister.  Pt will benefit from skilled PT to increase their independence and safety with mobility to allow discharge to the venue listed below.      Follow Up Recommendations Home health PT (to addess balance impairments w/ ambulation)    Equipment Recommendations  None recommended by PT    Recommendations for Other Services       Precautions / Restrictions Precautions Precautions: Fall Precaution Comments: Pt reports h/o ~6 falls in past year from tripping on objects Restrictions Weight Bearing Restrictions: No      Mobility  Bed Mobility Overal bed mobility: Independent                Transfers Overall transfer level: Modified independent                  Ambulation/Gait Ambulation/Gait assistance: Supervision Ambulation Distance (Feet): 300 Feet Assistive device: None Gait Pattern/deviations: Step-through pattern;Decreased weight shift to left;Decreased stride length;Antalgic   Gait velocity interpretation: at or above normal speed for age/gender General Gait Details: Rt trunk lean which pt reports is 2/2 ankle pain (at baseline).  As a result, mild instability noted w/ ambulation.    Stairs            Wheelchair Mobility    Modified Rankin (Stroke Patients Only)       Balance Overall balance  assessment: Needs assistance;History of Falls Sitting-balance support: No upper extremity supported;Feet supported Sitting balance-Leahy Scale: Good     Standing balance support: No upper extremity supported;During functional activity Standing balance-Leahy Scale: Fair                   Standardized Balance Assessment Standardized Balance Assessment : Dynamic Gait Index   Dynamic Gait Index Level Surface: Mild Impairment Change in Gait Speed: Normal Gait with Horizontal Head Turns: Mild Impairment Gait with Vertical Head Turns: Mild Impairment Gait and Pivot Turn: Normal Step Around Obstacles: Normal       Pertinent Vitals/Pain Pain Assessment: No/denies pain    Home Living Family/patient expects to be discharged to:: Private residence Living Arrangements: Other relatives (sister ) Available Help at Discharge: Family;Available PRN/intermittently (other family members who live nearby) Type of Home: House Home Access: Stairs to enter Entrance Stairs-Rails: Left;Right ("rickety stairs") Entrance Stairs-Number of Steps: 3 Home Layout: One level Home Equipment: None Additional Comments: Pt's home is very small and crowded (likely factor in tripping falls).  Pt's sister has severe Bil knee and back arthritis and pt assists his sister w/ tub transfers and dressing.  He says they mostly do microwave dinners as he has never learned to cook.     Prior Function Level of Independence: Independent               Hand Dominance        Extremity/Trunk  Assessment   Upper Extremity Assessment: Overall WFL for tasks assessed           Lower Extremity Assessment:  (Bil edema, otherwise WFL)         Communication   Communication: No difficulties  Cognition Arousal/Alertness: Awake/alert Behavior During Therapy: WFL for tasks assessed/performed Overall Cognitive Status: Within Functional Limits for tasks assessed                      General Comments  General comments (skin integrity, edema, etc.): Pt would benefit from HHPT at d/c to address balance deficts w/ ambulation.  SpO2 remained in high 90s throughout session.    Exercises General Exercises - Lower Extremity Ankle Circles/Pumps: AROM;Both;10 reps;Seated      Assessment/Plan    PT Assessment Patient needs continued PT services  PT Diagnosis Abnormality of gait   PT Problem List Decreased strength;Decreased range of motion;Decreased activity tolerance;Decreased balance;Decreased mobility;Decreased knowledge of use of DME;Decreased safety awareness;Decreased knowledge of precautions  PT Treatment Interventions DME instruction;Gait training;Functional mobility training;Therapeutic activities;Therapeutic exercise;Balance training;Neuromuscular re-education;Patient/family education   PT Goals (Current goals can be found in the Care Plan section) Acute Rehab PT Goals Patient Stated Goal: to go home once ready PT Goal Formulation: With patient Time For Goal Achievement: 04/19/15 Potential to Achieve Goals: Good    Frequency Min 3X/week   Barriers to discharge Inaccessible home environment;Decreased caregiver support 3 steps to enter home, only intermittent assist available at d/c    Co-evaluation               End of Session Equipment Utilized During Treatment: Gait belt Activity Tolerance: Patient tolerated treatment well Patient left: in chair;with call bell/phone within reach Nurse Communication: Mobility status;Precautions         Time: 4650-3546 PT Time Calculation (min) (ACUTE ONLY): 20 min   Charges:   PT Evaluation $Initial PT Evaluation Tier I: 1 Procedure     PT G CodesJoslyn Hy PT, DPT [phone] Pager: [phone] 04/05/2015, 6:03 PM

## 2015-04-05 NOTE — Progress Notes (Signed)
Family Medicine Teaching Service Daily Progress Note Intern Pager: [phone]  Patient name: Patrick [name] Medical record number: [id number] Date of birth: 12/14/1950 Age: 64 y.o. Gender: male  Primary Care Provider: Andrena Mews, MD Consultants: None Code Status: Full code  Pt Overview and Major Events to Date:  11/4: Patient admitted. Wt 220lbs 11/5: Wt 216lbs, UOP 3.725L 11/6: Wt 210lbs, Net: -10.36L  Assessment and Plan: Patrick [name] is a 63 y.o. male presenting with CHF exacerbation . PMH is significant for HFrEF  #CHF exacerbation; HFrEF: Improved; Patient diuresing well. Patient states he has been <200lbs earlier this year "first time since college" (currently 210lb via bed scale). Respiratory status is stable/improved. Net output is >10L. Will plan to continue diuresis as long as renal function does not worsen. - Daily weights - In/outs - Foley catheter >> DC'ing today - Keep O2 >92% if stars to develop dyspnea. - Continue Coreg 25mg  BID, Losartan 50mg  QD - Increase potassium chloride supplementation. - daily BMP - lasix 40mg  BID - Magnesium: 2.1 - PT/OT  #Scotal Edema: Improved; No tenderness. Ultrasound unremarkable for testicular pathology. - management per above - PT/OT  #AKI: creatinine improved since admission. Patient still volume up. - diurese as above - monitor regularly  #Microcystic anemia: hemoglobin trending down to 9.7 today from recent baseline around 12 - Ferritin: 14 (L) - Iron TIBC: 378; Iron: 23 (L) - Begin Iron replacement; Senna PRN for constipation.  - Will need GI consult as outpatient.  #Thrombocytopenia: Platelets 124 on admission. Improved to 149 - follow CBC  #Hypertension: slightly elevated on admission to 140s-150s. - continue home meds as above  #Intertrigo: present in groin area - nystatin powder  #CAD - continue statin  #BPH: followed by [facility] - continue home finasteride and tamsulosin   FEN/GI: Heart healthy,  SLIV Prophylaxis: Lovenox  Disposition: pending continued diureses  Subjective:  Patient reports improved symptoms this morning. Scrotal edema has drastically improved since admission. He denies any current chest pain. Shortness of breath is almost baseline. Patient has no new complaints at this time.  Objective: Temp:  [98.1 F (36.7 C)-98.7 F (37.1 C)] 98.7 F (37.1 C) (11/06 0509) Pulse Rate:  [67-69] 67 (11/06 0509) Resp:  [16-22] 16 (11/06 0509) BP: (128-144)/(70-79) 144/79 mmHg (11/06 0509) SpO2:  [93 %-98 %] 93 % (11/06 0509) Weight:  [210 lb 1.6 oz (95.301 kg)] 210 lb 1.6 oz (95.301 kg) (11/06 0506)  Physical Exam: General: Well appearing, no distress Cardiovascular: RRR. No murmur Respiratory: Clear to auscultation bilaterally Abdomen: Soft, obese, non-tender Extremities: Pitting edema up to knees bilaterally, no tenderness GU: Substantial scrotal edema noted (patient reports that this is improved), no signs of irritation or infection. Skin changes suggestive of chronic edema.  Laboratory:  Recent Labs Lab 04/03/15 1751 04/04/15 0432 04/05/15 0350  WBC 4.9 5.7 6.5  HGB 10.3* 9.7* 9.4*  HCT 34.5* 33.6* 32.3*  PLT 124* 149* 120*    Recent Labs Lab 04/03/15 1751 04/04/15 0432 04/05/15 0350  NA 142 141 143  K 4.1 3.5 2.8*  CL 98* 102 103  CO2 29 27 28   BUN 15 16 17   CREATININE 1.46* 1.43* 1.42*  CALCIUM 9.4 8.8* 8.4*  PROT 6.4*  --   --   BILITOT 1.9*  --   --   ALKPHOS 109  --   --   ALT 13*  --   --   AST 26  --   --   GLUCOSE 64* 74 80  Imaging/Diagnostic Tests: Dg Chest 2 View  04/03/2015  CLINICAL DATA:  64 year old presenting with 1 month history of progressive swelling in the scrotum and the upper legs. Progressive shortness of breath over the past 1 week. EXAM: CHEST  2 VIEW COMPARISON:  06/18/2014 and earlier, including CTA chest 06/01/2014. FINDINGS: AP and lateral images were obtained. Suboptimal inspiration. Cardiac silhouette  moderately enlarged, unchanged. Hilar and mediastinal contours otherwise unremarkable. Mild pulmonary venous hypertension without overt edema. Lungs otherwise clear. No localized airspace consolidation. No pleural effusions. No pneumothorax. Osseous demineralization and mild degenerative changes involving the thoracic spine. IMPRESSION: Stable cardiomegaly.  No acute cardiopulmonary disease. Electronically Signed   By: Evangeline Dakin M.D.   On: 04/03/2015 18:49   US Scrotum  04/03/2015  CLINICAL DATA:  Scrotal swelling. EXAM: SCROTAL ULTRASOUND DOPPLER ULTRASOUND OF THE TESTICLES COMPARISON:  Comparison made to prior CT of 09/30/2012. TECHNIQUE: Complete ultrasound examination of the testicles, epididymis, and other scrotal structures was performed. Color and spectral Doppler ultrasound were also utilized to evaluate blood flow to the testicles. FINDINGS: Right testicle Measurements: 4.3 x 2.9 x 2.8 cm. No mass or microlithiasis visualized. Left testicle Measurements: 4.4 x 2.9 x 3.0 cm. No mass or microlithiasis visualized. Right epididymis:  Normal in size and appearance. Left epididymis:  Normal in size and appearance. Hydrocele: Bilateral hydroceles. Moderate scrotal edema noted bilaterally. Varicocele:  None visualized. Pulsed Doppler interrogation of both testes demonstrates normal low resistance arterial and venous waveforms bilaterally. IMPRESSION: 1. No evidence of testicular mass or torsion. 2. Bilateral moderate-sized hydroceles with moderate scrotal edema. Electronically Signed   By: Marcello Moores  Register   On: 04/03/2015 16:48   [place] Art/ven Flow Abd Pelv Doppler  04/03/2015  CLINICAL DATA:  Scrotal swelling. EXAM: SCROTAL ULTRASOUND DOPPLER ULTRASOUND OF THE TESTICLES COMPARISON:  Comparison made to prior CT of 09/30/2012. TECHNIQUE: Complete ultrasound examination of the testicles, epididymis, and other scrotal structures was performed. Color and spectral Doppler ultrasound were also utilized to  evaluate blood flow to the testicles. FINDINGS: Right testicle Measurements: 4.3 x 2.9 x 2.8 cm. No mass or microlithiasis visualized. Left testicle Measurements: 4.4 x 2.9 x 3.0 cm. No mass or microlithiasis visualized. Right epididymis:  Normal in size and appearance. Left epididymis:  Normal in size and appearance. Hydrocele: Bilateral hydroceles. Moderate scrotal edema noted bilaterally. Varicocele:  None visualized. Pulsed Doppler interrogation of both testes demonstrates normal low resistance arterial and venous waveforms bilaterally. IMPRESSION: 1. No evidence of testicular mass or torsion. 2. Bilateral moderate-sized hydroceles with moderate scrotal edema. Electronically Signed   By: Marcello Moores  Register   On: 04/03/2015 16:48    Elberta Leatherwood, MD 04/05/2015, 12:29 PM PGY-2, [place] Intern pager: [phone], text pages welcome

## 2015-04-06 DIAGNOSIS — I509 Heart failure, unspecified: Secondary | ICD-10-CM

## 2015-04-06 LAB — CBC
HEMATOCRIT: 33.2 % — AB (ref 39.0–52.0)
Hemoglobin: 9.6 g/dL — ABNORMAL LOW (ref 13.0–17.0)
MCH: 21.1 pg — AB (ref 26.0–34.0)
MCHC: 28.9 g/dL — ABNORMAL LOW (ref 30.0–36.0)
MCV: 73 fL — AB (ref 78.0–100.0)
PLATELETS: 134 10*3/uL — AB (ref 150–400)
RBC: 4.55 MIL/uL (ref 4.22–5.81)
RDW: 18.7 % — ABNORMAL HIGH (ref 11.5–15.5)
WBC: 7.5 10*3/uL (ref 4.0–10.5)

## 2015-04-06 LAB — BASIC METABOLIC PANEL
ANION GAP: 9 (ref 5–15)
BUN: 17 mg/dL (ref 6–20)
CHLORIDE: 103 mmol/L (ref 101–111)
CO2: 31 mmol/L (ref 22–32)
Calcium: 8.6 mg/dL — ABNORMAL LOW (ref 8.9–10.3)
Creatinine, Ser: 1.46 mg/dL — ABNORMAL HIGH (ref 0.61–1.24)
GFR calc non Af Amer: 48 mL/min — ABNORMAL LOW (ref >60)
GFR, EST AFRICAN AMERICAN: 56 mL/min — AB (ref >60)
GLUCOSE: 113 mg/dL — AB (ref 65–99)
Potassium: 3.1 mmol/L — ABNORMAL LOW (ref 3.5–5.1)
Sodium: 143 mmol/L (ref 135–145)

## 2015-04-06 MED ORDER — POTASSIUM CHLORIDE CRYS ER 20 MEQ PO TBCR
40.0000 meq | EXTENDED_RELEASE_TABLET | Freq: Once | ORAL | Status: DC
  Filled 2015-04-06: qty 2

## 2015-04-06 MED ORDER — ACETAMINOPHEN 325 MG PO TABS
650.0000 mg | ORAL_TABLET | Freq: Four times a day (QID) | ORAL | Status: DC | PRN
  Filled 2015-04-06: qty 2

## 2015-04-06 MED ORDER — ACETAMINOPHEN 325 MG PO TABS
650.0000 mg | ORAL_TABLET | ORAL | Status: DC | PRN
  Administered 2015-04-06: 650 mg via ORAL
  Filled 2015-04-06: qty 2

## 2015-04-06 MED ORDER — TORSEMIDE 20 MG PO TABS
60.0000 mg | ORAL_TABLET | Freq: Every day | ORAL | Status: DC
  Administered 2015-04-06 – 2015-04-07 (×2): 60 mg via ORAL
  Filled 2015-04-06 (×2): qty 3

## 2015-04-06 MED ORDER — ACETAMINOPHEN 325 MG PO TABS
650.0000 mg | ORAL_TABLET | Freq: Four times a day (QID) | ORAL | Status: DC | PRN
  Administered 2015-04-06 (×2): 650 mg via ORAL
  Filled 2015-04-06: qty 2

## 2015-04-06 MED ORDER — POTASSIUM CHLORIDE CRYS ER 20 MEQ PO TBCR
40.0000 meq | EXTENDED_RELEASE_TABLET | Freq: Once | ORAL | Status: AC
  Administered 2015-04-06: 40 meq via ORAL
  Filled 2015-04-06: qty 2

## 2015-04-06 NOTE — Evaluation (Signed)
Occupational Therapy Evaluation Patient Details Name: Patrick [name] MRN: [id number] DOB: [birth year] Today's Date: 04/06/2015    History of Present Illness Pt is a 64 y/o M presenting w/ a CHF exacerbation.  Pt's PMH includes scrotal edema, anemia, Rt DJD.   Clinical Impression   Pt admitted with above diagnosis.  Pt presented with functional limitations due to the deficits listed below (see OT Problem list).  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  Pt completed LB dressing and functional transfers at Mod I level this session.  Educated on fall risk and recommended pt pace himself and scan environment during ambulation to decrease fall risk, as he reports his home is somewhat cluttered.  Pt will not require further OT follow up at this time.    Follow Up Recommendations  No OT follow up    Equipment Recommendations  None recommended by OT    Recommendations for Other Services       Precautions / Restrictions Precautions Precautions: Fall Precaution Comments: Pt reports h/o ~6 falls in past year from tripping on objects Restrictions Weight Bearing Restrictions: No      Mobility Bed Mobility Overal bed mobility: Independent                Transfers Overall transfer level: Modified independent                         ADL Overall ADL's : At baseline                                       General ADL Comments: Pt donned socks independently without AD, pt reports occasionally using sock aid to assist PTA.  Toilet transfers and toileting Mod I.     Vision Vision Assessment?: No apparent visual deficits          Pertinent Vitals/Pain Pain Assessment: 0-10 Pain Score: 3  Pain Location: Left foot Pain Descriptors / Indicators: Constant Pain Intervention(s): Monitored during session;Patient requesting pain meds-RN notified     Hand Dominance Right   Extremity/Trunk Assessment Upper Extremity Assessment Upper Extremity Assessment:  Overall WFL for tasks assessed   Lower Extremity Assessment Lower Extremity Assessment: Overall WFL for tasks assessed       Communication Communication Communication: No difficulties   Cognition Arousal/Alertness: Awake/alert Behavior During Therapy: WFL for tasks assessed/performed Overall Cognitive Status: Within Functional Limits for tasks assessed                                Home Living Family/patient expects to be discharged to:: Private residence Living Arrangements: Other relatives (is caregiver for sister) Available Help at Discharge: Family;Available PRN/intermittently (other family memebers who live nearby) Type of Home: House Home Access: Stairs to enter [employer] of Steps: 3 Entrance Stairs-Rails: Left;Right ("rickety stairs") Home Layout: One level     Bathroom Shower/Tub: Tub/shower unit Shower/tub characteristics: Architectural technologist: Standard     Home Equipment: Shower seat (sister uses shower seat)   Additional Comments: Pt's home is very small and crowded (likely factor in tripping falls).  Pt's sister has severe Bil knee and back arthritis and pt assists his sister w/ tub transfers and dressing.  He says they mostly do microwave dinners as he has never learned to cook.       Prior Functioning/Environment  Level of Independence: Independent             OT Diagnosis: Generalized weakness   OT Problem List: Decreased strength;Decreased activity tolerance;Pain   OT Treatment/Interventions:      OT Goals(Current goals can be found in the care plan section) Acute Rehab OT Goals Patient Stated Goal: to go home once ready OT Goal Formulation: All assessment and education complete, DC therapy                End of Session Nurse Communication: Mobility status  Activity Tolerance: Patient tolerated treatment well;No increased pain Patient left: in bed;with call bell/phone within reach   Time: 0943-1004 OT Time  Calculation (min): 21 min Charges:  OT General Charges $OT Visit: 1 Procedure OT Evaluation $Initial OT Evaluation Tier I: 1 Procedure G-CodesSimonne Come, [id number] 04/06/2015, 10:14 AM

## 2015-04-06 NOTE — Progress Notes (Signed)
Family Medicine Teaching Service Daily Progress Note Intern Pager: [phone]  Patient name: Patrick [name] Medical record number: [id number] Date of birth: Nov 18, 1950 Age: 64 y.o. Gender: male  Primary Care Provider: Andrena Mews, MD Consultants: None Code Status: Full code  Pt Overview and Major Events to Date:  11/4: Patient admitted. Wt 220lbs 11/5: Wt 216lbs, UOP 3.725L 11/6: Wt 210lbs, Net: -10.36L  Assessment and Plan: Patrick [name] is a 64 y.o. male presenting with CHF exacerbation . PMH is significant for HFrEF  #CHF exacerbation; HFrEF: Improved; Patient diuresing well. Patient states he has been <200lbs earlier this year "first time since college" Respiratory status is stable/improved. - Daily weights 220 lbs> 216 lbs> 210 lbs> 201 lbs  - Will follow K/Scr as diuresis  - Outputs 4.1 L - Continue Coreg 25mg  BID, Losartan 50mg  QD  - K 2.8, 11/6 > recheck this AM, 30 meq today  - Daily BMET - lasix 40mg  BID -> transition to Torsemide 60 mg daily  - Magnesium: 2.1 - PT/OT  #Scotal Edema: Improved; No tenderness. Ultrasound unremarkable for testicular pathology. - management per above - PT/OT  #AKI: creatinine improved since admission. Patient still volume up. - diurese as above - monitor regularly  #Microcystic anemia: Patient with bright red blood per rectum this AM. No hx of colonoscopy.  hemoglobin trending down to 9.7 today from recent baseline around 12 - Ferritin: 14 (L) Iron TIBC: 378; Iron: 23 (L) - Begin Iron replacement; Senna PRN for constipation. - Patient need GI consult as outpatient. - Will get FOBT   #Thrombocytopenia: Platelets 124 on admission. Improved to 149 - follow CBC  #Hypertension: BPs 135/59 - continue home meds as above  #Intertrigo: present in groin area - nystatin powder  #CAD - continue statin  #BPH: followed by [facility] - continue home finasteride and tamsulosin  FEN/GI: Heart healthy, SLIV Prophylaxis:  Lovenox  Disposition: pending continued diureses  Subjective:  Patient states he is doing better. States improved SOB since admission. Denies chest pain, palpations, n/v, fever and chills. Patient with bright red blood per rectum, states he has hemorrhoids and has had this in the past.   Objective: Temp:  [97.5 F (36.4 C)-98.3 F (36.8 C)] 98.3 F (36.8 C) (11/07 0528) Pulse Rate:  [68-75] 75 (11/07 0528) Resp:  [15-18] 15 (11/07 0528) BP: (134-147)/(60-77) 147/77 mmHg (11/07 0528) SpO2:  [97 %-99 %] 99 % (11/07 0528) Weight:  [201 lb 3.2 oz (91.264 kg)] 201 lb 3.2 oz (91.264 kg) (11/07 0346)  Physical Exam: General: Well appearing, no distress Cardiovascular: RRR, systolic murmur  Respiratory: Clear to auscultation bilaterally Abdomen: Soft, obese, non-tender Extremities: 2+ Pitting edema up to knees bilaterally, no tenderness  Laboratory:  Recent Labs Lab 04/04/15 0432 04/05/15 0350 04/06/15 0530  WBC 5.7 6.5 7.5  HGB 9.7* 9.4* 9.6*  HCT 33.6* 32.3* 33.2*  PLT 149* 120* 134*    Recent Labs Lab 04/03/15 1751 04/04/15 0432 04/05/15 0350  NA 142 141 143  K 4.1 3.5 2.8*  CL 98* 102 103  CO2 29 27 28   BUN 15 16 17   CREATININE 1.46* 1.43* 1.42*  CALCIUM 9.4 8.8* 8.4*  PROT 6.4*  --   --   BILITOT 1.9*  --   --   ALKPHOS 109  --   --   ALT 13*  --   --   AST 26  --   --   GLUCOSE 64* 74 80    Imaging/Diagnostic Tests: No results  found.  Aldea Avis Cletis Media, MD 04/06/2015, 7:32 AM PGY-2, [place] Intern pager: [phone], text pages welcome

## 2015-04-06 NOTE — Discharge Summary (Signed)
[facility] Discharge Summary  Patient name: Patrick [name] Medical record number: [id number] Date of birth: 06-14-1950 Age: 64 y.o. Gender: male Date of Admission: 04/03/2015  Date of Discharge: 04/07/2015  Admitting Physician: Zenia Resides, MD  Primary Care Provider: Andrena Mews, MD Consultants: None   Indication for Hospitalization: CHF exacerbation   Discharge Diagnoses/Problem List:  Patient Active Problem List   Diagnosis Date Noted  . Systolic CHF, acute on chronic (HCC)   . Scrotal swelling   . Urinary outflow obstruction   . Scrotal edema 04/03/2015  . CHF (congestive heart failure) ([place]) 04/03/2015  . CHF exacerbation ([place]) 04/03/2015  . Prediabetes 10/03/2014  . Hypokalemia 08/29/2014  . Other iron deficiency anemias   . Essential hypertension 01/16/2007   Disposition: Home   Discharge Condition: Stable   Discharge Exam:  General: Well appearing, no distress, pleasant and conversational Cardiovascular: RRR, systolic murmur  Respiratory: Clear to auscultation bilaterally Abdomen: Soft, obese, non-tender Extremities: 2+ Pitting edema 2/3rds of the way to the knee, no tenderness  Brief Hospital Course:  Patient admitted with CHF exacerbation. Patient with fluid overload symptoms including significant scrotal edema. S/p foley secondary to significant scrotal edema and inability to void well. BNP 980, which is similar to value at last admission 06/18/2014. Dry weight on discharge from last admission is 200lbs. Patient on admission was about 19 pounds up to 219lbs. Patient was diuresised throughout his stay with a discharge weight at 201lbs. Patient scrota edema improved, with unremarkable U/S, foley was removed.    Issues for Follow Up:  1. Patient with normocytic anemia, has no hx of colonscopy, will need a consult to GI for colonoscopy, also patient started on Ferrous sulfate for iron deficency  2. Will continue patient on home  toresmide, follow up on patient's weight in the clinic to ensure maintaining diuresis  3. Would consider getting BMET to recheck K, low during admission  4. Patient receiving home PT, follow up on this as needed.   Significant Procedures: None   Significant Labs and Imaging:   Recent Labs Lab 04/05/15 0350 04/06/15 0530 04/07/15 0513  WBC 6.5 7.5 6.1  HGB 9.4* 9.6* 9.4*  HCT 32.3* 33.2* 32.9*  PLT 120* 134* 124*    Recent Labs Lab 04/03/15 1751 04/04/15 0432 04/04/15 0930 04/05/15 0350 04/06/15 1516 04/07/15 0513  NA 142 141  --  143 143 143  K 4.1 3.5  --  2.8* 3.1* 3.1*  CL 98* 102  --  103 103 103  CO2 29 27  --  28 31 29   GLUCOSE 64* 74  --  80 113* 102*  BUN 15 16  --  17 17 19   CREATININE 1.46* 1.43*  --  1.42* 1.46* 1.33*  CALCIUM 9.4 8.8*  --  8.4* 8.6* 8.7*  MG  --   --  2.1  --   --   --   ALKPHOS 109  --   --   --   --   --   AST 26  --   --   --   --   --   ALT 13*  --   --   --   --   --   ALBUMIN 3.3*  --   --   --   --   --     Results/Tests Pending at Time of Discharge: None   Discharge Medications:    Medication List    TAKE these medications  aspirin 81 MG EC tablet  Take 1 tablet (81 mg total) by mouth daily.     BIOFLAVONOIDS PO  Take 1 tablet by mouth 2 (two) times daily.     carvedilol 25 MG tablet  Commonly known as:  COREG  Take 1 tablet (25 mg total) by mouth 2 (two) times daily.     ferrous sulfate 325 (65 FE) MG tablet  Take 1 tablet (325 mg total) by mouth daily with breakfast.     finasteride 5 MG tablet  Commonly known as:  PROSCAR  TAKE 1 TABLET BY MOUTH DAILY     fluticasone 50 MCG/ACT nasal spray  Commonly known as:  FLONASE  Place 2 sprays into both nostrils daily.     ibuprofen 800 MG tablet  Commonly known as:  ADVIL,MOTRIN  Take 1 tablet (800 mg total) by mouth every 12 (twelve) hours as needed for headache or moderate pain.     losartan 50 MG tablet  Commonly known as:  COZAAR  TAKE 1 TABLET BY  MOUTH EVERY DAY     lovastatin 20 MG tablet  Commonly known as:  MEVACOR  TAKE 1 TABLET BY MOUTH AT BEDTIME     multivitamin with minerals tablet  Take 1 tablet by mouth daily.     omega-3 acid ethyl esters 1 G capsule  Commonly known as:  LOVAZA  TAKE 2 CAPSULES BY MOUTH TWICE DAILY     potassium chloride SA 20 MEQ tablet  Commonly known as:  K-DUR,KLOR-CON  TAKE 2 TABLET BY MOUTH EVERY DAY     senna 8.6 MG Tabs tablet  Commonly known as:  SENOKOT  Take 1 tablet (8.6 mg total) by mouth daily as needed for mild constipation.     EQL SENNA LAXATIVE 8.6 MG tablet  Generic drug:  senna  Take 8.6 mg by mouth 3 (three) times daily. Pt states he rotates between 2 and 3 tablets nightly     tamsulosin 0.4 MG Caps capsule  Commonly known as:  FLOMAX  Take 0.4 mg by mouth daily.     torsemide 20 MG tablet  Commonly known as:  DEMADEX  Take 3 tablets (60 mg total) by mouth daily.     traMADol 50 MG tablet  Commonly known as:  ULTRAM  TAKE 1 TABLET BY MOUTH EVERY 8 HOURS AS NEEDED FOR PAIN     traZODone 100 MG tablet  Commonly known as:  DESYREL  Take 1 tablet (100 mg total) by mouth 2 (two) times daily.     vitamin C 1000 MG tablet  Take 2,000 mg by mouth 2 (two) times daily.     zoster vaccine live (PF) 19400 UNT/0.65ML injection  Commonly known as:  ZOSTAVAX  Inject 19,400 Units into the skin once.        Discharge Instructions: Please refer to Patient Instructions section of EMR for full details.  Patient was counseled important signs and symptoms that should prompt return to medical care, changes in medications, dietary instructions, activity restrictions, and follow up appointments.   Follow-Up Appointments: Follow-up Information    Follow up with [facility].   Why:  HHRN for CHF, HHPT   Contact information:   [address] [place] [address] [phone]       Follow up with [facility].   Why:  cane   Contact  information:   [address] [place] [address] [phone]  Follow up with Andrena Mews, MD On 04/17/2015.   Specialty:  Family Medicine   Why:  hospital follow-up visit at 9:00am.   Contact information:   [place] [address] [place], MD 04/07/2015, 9:45 PM PGY-1, [place]

## 2015-04-06 NOTE — Care Management Important Message (Signed)
Important Message  Patient Details  Name: Patrick [name] MRN: [id number] Date of Birth: 08/06/1950   Medicare Important Message Given:  Yes-second notification given    Nathen May 04/06/2015, 3:54 PM

## 2015-04-06 NOTE — Care Management Note (Addendum)
Case Management Note  Patient Details  Name: Patrick [name] MRN: [id number] Date of Birth: 08/09/1950  Subjective/Objective:    Date:04/06/15 Spoke with patient at the bedside. Introduced self as Tourist information centre manager and explained role in discharge planning and how to be reached. Verified patient lives in town, with sister. Expressed potential need for a cane. Verified patient anticipates to go home with family, at time of discharge and will have part-time supervision by family friends at this time to best of their knowledge. Patient denied needing help with their medication. Patient drives  to MD appointments. Verified patient has PCP [place]. Patient chose [facility] for hhpt, referral made to [facility] with [facility], for [facility] for CHF program and HHPT.  Patient also states would like to have a cane also from [facility], Referral given to [facility].  Plan: CM will continue to follow for discharge planning and [facility] resources.                 Action/Plan:   Expected Discharge Date:                  Expected Discharge Plan:  [place]  In-House Referral:     Discharge planning Services  CM Consult  Post Acute Care Choice:  Home Health Choice offered to:  Patient  DME Arranged:  Kasandra Knudsen DME Agency:  [place] Arranged:  RN, Disease Management, PT [place] Agency:  [place]  Status of Service:  In process, will continue to follow  Medicare Important Message Given:    Date Medicare IM Given:    Medicare IM give by:    Date Additional Medicare IM Given:    Additional Medicare Important Message give by:     If discussed at [name] of Stay Meetings, dates discussed:    Additional Comments:  Zenon Mayo, RN 04/06/2015, 11:26 AM

## 2015-04-07 LAB — BASIC METABOLIC PANEL
ANION GAP: 11 (ref 5–15)
BUN: 19 mg/dL (ref 6–20)
CALCIUM: 8.7 mg/dL — AB (ref 8.9–10.3)
CO2: 29 mmol/L (ref 22–32)
CREATININE: 1.33 mg/dL — AB (ref 0.61–1.24)
Chloride: 103 mmol/L (ref 101–111)
GFR, EST NON AFRICAN AMERICAN: 54 mL/min — AB (ref >60)
Glucose, Bld: 102 mg/dL — ABNORMAL HIGH (ref 65–99)
Potassium: 3.1 mmol/L — ABNORMAL LOW (ref 3.5–5.1)
SODIUM: 143 mmol/L (ref 135–145)

## 2015-04-07 LAB — CBC
HEMATOCRIT: 32.9 % — AB (ref 39.0–52.0)
Hemoglobin: 9.4 g/dL — ABNORMAL LOW (ref 13.0–17.0)
MCH: 21 pg — ABNORMAL LOW (ref 26.0–34.0)
MCHC: 28.6 g/dL — AB (ref 30.0–36.0)
MCV: 73.6 fL — ABNORMAL LOW (ref 78.0–100.0)
Platelets: 124 10*3/uL — ABNORMAL LOW (ref 150–400)
RBC: 4.47 MIL/uL (ref 4.22–5.81)
RDW: 18.8 % — AB (ref 11.5–15.5)
WBC: 6.1 10*3/uL (ref 4.0–10.5)

## 2015-04-07 MED ORDER — POTASSIUM CHLORIDE CRYS ER 20 MEQ PO TBCR
40.0000 meq | EXTENDED_RELEASE_TABLET | Freq: Two times a day (BID) | ORAL | Status: DC
  Administered 2015-04-07: 40 meq via ORAL
  Filled 2015-04-07: qty 2

## 2015-04-07 MED ORDER — TORSEMIDE 20 MG PO TABS: 60.0000 mg | ORAL_TABLET | Freq: Every day | ORAL | Status: DC

## 2015-04-07 MED ORDER — FERROUS SULFATE 325 (65 FE) MG PO TABS: 325.0000 mg | ORAL_TABLET | Freq: Every day | ORAL | Status: DC

## 2015-04-07 NOTE — Progress Notes (Signed)
Patient discharge teaching given, including activity, diet, follow-up appoints, and medications. Patient verbalized understanding of all discharge instructions. IV access was d/c'd. Vitals are stable. Skin is intact except as charted in most recent assessments. Pt to be escorted out by NT, to be driven home by family. 

## 2015-04-07 NOTE — Progress Notes (Signed)
Family Medicine Teaching Service Daily Progress Note Intern Pager: [phone]  Patient name: Patrick [name] Medical record number: [id number] Date of birth: [birth year] Age: 64 y.o. Gender: male  Primary Care Provider: Andrena Mews, MD Consultants: None Code Status: Full code  Pt Overview and Major Events to Date:  11/4: Patient admitted. Wt 220lbs 11/5: Wt 216lbs, UOP 3.725L 11/6: Wt 210lbs, Net: -10.36L  Assessment and Plan: Patrick [name] is a 64 y.o. male presenting with CHF exacerbation . PMH is significant for HFrEF.  #CHF exacerbation; HFrEF: Improved; Patient diuresing well. Patient states he has been <200lbs earlier this year "first time since college" Respiratory status is stable/improved. - Toresemide 60mg  daily - Daily weights 220 lbs> 216 lbs> 210 lbs> 201 lbs  - Cr has trended down from 1.50 on admission to 1.33 today. - Urine output 2.95L in the last 24 hours. - Continue Coreg 25mg  BID, Losartan 50mg  QD  - K 2.8, 11/6 > recheck this AM, 30 meq today  - Daily BMET  - Magnesium: 2.1 - PT recommended HHPT - OT recommended no OT f/u - Will discharge Pt home today, given his clinical improvement.  #Scotal Edema: Improved; No tenderness. Ultrasound unremarkable for testicular pathology. - Management per above  #Hypokalemia: K+ was 4.1 on admission. Has been trending down, and is 3.1 today. Have been replacing with K-dur as needed. - K-Dur 5mEq bid - Monitor with daily BMETs  #AKI: Creatinine improved from 1.50 on admission to 1.33 today. Patient still volume up. - Diurese as above - Monitor with daily BMETs  #Microcystic anemia: Patient with bright red blood per rectum this AM. No hx of colonoscopy. Hemoglobin trending down to 9.4 today from recent baseline around 12 - Ferritin: 14 (L) Iron TIBC: 378; Iron: 23 (L) - Ferrous sulfate 325mg  qd - Senna PRN for constipation. - Patient need GI consult as outpatient. - F/u FOBT  #Thrombocytopenia: Platelets 124 on  admission. Improved to 149. - follow CBC  #Hypertension: BPs 130-159/70-88. - continue home meds as above  #Intertrigo: present in groin area - nystatin powder  #CAD - continue statin  #BPH: followed by [facility] - continue home finasteride and tamsulosin  FEN/GI: Heart healthy, SLIV Prophylaxis: Lovenox  Disposition: Home likely today with HHPT.  Subjective:  Pt states he is doing well this morning. He says that he feels like his scrotal swelling has improved. He does note some very mild shortness of breath, but states that he has this at baseline. He is ready to go home today.  Objective: Temp:  [97.6 F (36.4 C)-98.1 F (36.7 C)] 97.6 F (36.4 C) (11/08 0445) Pulse Rate:  [57-76] 76 (11/08 0445) Resp:  [18-22] 18 (11/08 0445) BP: (130-159)/(59-88) 159/88 mmHg (11/08 0445) SpO2:  [97 %-99 %] 97 % (11/08 0445)  Physical Exam: General: Well appearing, no distress, pleasant and conversational Cardiovascular: RRR, systolic murmur  Respiratory: Clear to auscultation bilaterally Abdomen: Soft, obese, non-tender Extremities: 2+ Pitting edema 2/3rds of the way to the knee, no tenderness  Laboratory:  Recent Labs Lab 04/05/15 0350 04/06/15 0530 04/07/15 0513  WBC 6.5 7.5 6.1  HGB 9.4* 9.6* 9.4*  HCT 32.3* 33.2* 32.9*  PLT 120* 134* 124*    Recent Labs Lab 04/03/15 1751  04/05/15 0350 04/06/15 1516 04/07/15 0513  NA 142  < > 143 143 143  K 4.1  < > 2.8* 3.1* 3.1*  CL 98*  < > 103 103 103  CO2 29  < > 28 31 29  BUN 15  < > 17 17 19   CREATININE 1.46*  < > 1.42* 1.46* 1.33*  CALCIUM 9.4  < > 8.4* 8.6* 8.7*  PROT 6.4*  --   --   --   --   BILITOT 1.9*  --   --   --   --   ALKPHOS 109  --   --   --   --   ALT 13*  --   --   --   --   AST 26  --   --   --   --   GLUCOSE 64*  < > 80 113* 102*  < > = values in this interval not displayed.  Imaging/Diagnostic Tests: No results found.  Sela Hua, MD 04/07/2015, 7:31 AM PGY-1, [place] Intern pager: [phone], text pages welcome

## 2015-04-07 NOTE — Progress Notes (Signed)
Physical Therapy Treatment Patient Details Name: Patrick [name] MRN: [id number] DOB: 06-12-1950 Today's Date: 04-24-2015    History of Present Illness Pt is a 64 y/o M presenting w/ a CHF exacerbation.  Pt's PMH includes scrotal edema, anemia, Rt DJD.    PT Comments    Pt doing well and planning to return home today.  Follow Up Recommendations  Home health PT (to addess balance impairments w/ ambulation)     Equipment Recommendations  Cane (pt received)    Recommendations for Other Services       Precautions / Restrictions Precautions Precautions: Fall Precaution Comments: Pt reports h/o ~6 falls in past year from tripping on objects Restrictions Weight Bearing Restrictions: No    Mobility  Bed Mobility Overal bed mobility: Independent                Transfers Overall transfer level: Modified independent                  Ambulation/Gait Ambulation/Gait assistance: Supervision Ambulation Distance (Feet): 180 Feet Assistive device: Straight cane Gait Pattern/deviations: Step-through pattern;Decreased stride length   Gait velocity interpretation: Below normal speed for age/gender General Gait Details: Good use of cane   Stairs            Wheelchair Mobility    Modified Rankin (Stroke Patients Only)       Balance     Sitting balance-Leahy Scale: Good       Standing balance-Leahy Scale: Fair                      Cognition Arousal/Alertness: Awake/alert Behavior During Therapy: WFL for tasks assessed/performed Overall Cognitive Status: Within Functional Limits for tasks assessed                      Exercises      General Comments        Pertinent Vitals/Pain Pain Assessment: No/denies pain    Home Living                      Prior Function            PT Goals (current goals can now be found in the care plan section) Progress towards PT goals: Progressing toward goals    Frequency  Min  3X/week    PT Plan Current plan remains appropriate    Co-evaluation             End of Session Equipment Utilized During Treatment: Gait belt Activity Tolerance: Patient tolerated treatment well Patient left: with call bell/phone within reach;in bed;with bed alarm set     Time: 1212-1227 PT Time Calculation (min) (ACUTE ONLY): 15 min  Charges:  $Gait Training: 8-22 mins                    G Codes:      Patrick [name] 04/24/15, 2:09 PM [employer] PT [phone]

## 2015-04-07 NOTE — Discharge Instructions (Signed)
You were hospitalized for a CHF exacerbation. We gave you medications to help you get rid of the excess fluid. Please continue to take Torsemide 60mg  daily to decrease your risk of being hospitalized in the future.

## 2015-04-07 NOTE — Care Management Note (Signed)
Case Management Note  Patient Details  Name: NIRVAN LABAN MRN: [id number] Date of Birth: 1951/01/15  Subjective/Objective:        Patient is for dc today, NCM notified [facility] , they are aware and patient has cane in his room.              Action/Plan:   Expected Discharge Date:                  Expected Discharge Plan:  [place]  In-House Referral:     Discharge planning Services  CM Consult  Post Acute Care Choice:  Home Health Choice offered to:  Patient  DME Arranged:  Kasandra Knudsen DME Agency:  [place] Arranged:  RN, Disease Management, PT [place] Agency:  [place]  Status of Service:  Completed, signed off  Medicare Important Message Given:  Yes-second notification given Date Medicare IM Given:    Medicare IM give by:    Date Additional Medicare IM Given:    Additional Medicare Important Message give by:     If discussed at [place] of Stay Meetings, dates discussed:    Additional Comments:  Zenon Mayo, RN 04/07/2015, 10:32 AM

## 2015-04-10 ENCOUNTER — Telehealth: Payer: Self-pay | Admitting: Family Medicine

## 2015-04-10 NOTE — Telephone Encounter (Signed)
Pt is calling because he has gained 4.3 lbs in 2 days. 3 of those pounds were overnight. He would like to speak to Dr. Gwendlyn Deutscher. Blima Rich

## 2015-04-10 NOTE — Telephone Encounter (Signed)
I spoke with patient about his water retention. He stated he has gained about 5 lbs since d/c from the hospital 2 days ago. He leg swelling is worsening as well. He stated his BP is holding up at about 125/75 to about 128/80. I recommended he increases tosemide to 60 mg in am and 20 in pm. Continue home BP check, if less than 100/60 to hold PM dose of 20 mg. He is to come for nurse visit on Monday for BP and weight check.

## 2015-04-13 ENCOUNTER — Ambulatory Visit (INDEPENDENT_AMBULATORY_CARE_PROVIDER_SITE_OTHER): Payer: Commercial Managed Care - HMO | Admitting: *Deleted

## 2015-04-13 DIAGNOSIS — I10 Essential (primary) hypertension: Secondary | ICD-10-CM

## 2015-04-13 NOTE — Progress Notes (Signed)
Pt here today for weight check and Bp check.  His weight is up from 204 at discharge to 212 today.  He brought his recording and Dr. Gwendlyn Deutscher made a copy.  His BP is 130/90.  Per Dr. Gwendlyn Deutscher he will increase his torsemide from 60 in the AM to 40 in the PM.  Pt is agreeable, he will also call his Cardiologist today to make an appt. Anahy Esh, [name]

## 2015-04-13 NOTE — Progress Notes (Signed)
I agree with Jessica's documentation. I increased his torsemide to 60 mg in AM and 40 mg in PM from 60 and 20 mg respectively. I recommended daily weight check and BP check. I advised him to hold diuretics if BP is < 100/60. He is instructed to follow-up with cardiologist soon.

## 2015-04-14 ENCOUNTER — Telehealth: Payer: Self-pay | Admitting: Family Medicine

## 2015-04-14 ENCOUNTER — Other Ambulatory Visit: Payer: Self-pay | Admitting: Family Medicine

## 2015-04-14 DIAGNOSIS — I509 Heart failure, unspecified: Secondary | ICD-10-CM

## 2015-04-14 DIAGNOSIS — F32A Depression, unspecified: Secondary | ICD-10-CM

## 2015-04-14 DIAGNOSIS — F329 Major depressive disorder, single episode, unspecified: Secondary | ICD-10-CM

## 2015-04-14 NOTE — Telephone Encounter (Signed)
Nurse with [facility]: is requesting a Education officer, museum to help with community resourses,. Also pt is depressed. Would dr consider an antidepressent ?

## 2015-04-14 NOTE — Telephone Encounter (Signed)
Will forward to MD. Jenisis Harmsen,CMA  

## 2015-04-14 NOTE — Telephone Encounter (Signed)
Please advise, patient is already on antidepressant. Have him follow up with me soon for reassessment. I will put a Education officer, museum request. Not sure how soon this will kick in since [place] is not around.   Pls call [facility] with this info.

## 2015-04-15 NOTE — Telephone Encounter (Signed)
Sounds good

## 2015-04-15 NOTE — Telephone Encounter (Signed)
Spoke with patient and informed him of message from MD and donita.  Patient states that he is on trazadone and has only used it for sleep.  He doesn't feel like it helps his depression at all.  Informed patient that when he comes in on Friday we can discuss this and also check on options for his community resources. Sven Pinheiro,CMA

## 2015-04-17 ENCOUNTER — Telehealth: Payer: Self-pay | Admitting: Cardiology

## 2015-04-17 ENCOUNTER — Encounter: Payer: Self-pay | Admitting: Family Medicine

## 2015-04-17 ENCOUNTER — Ambulatory Visit (INDEPENDENT_AMBULATORY_CARE_PROVIDER_SITE_OTHER): Payer: Commercial Managed Care - HMO | Admitting: Family Medicine

## 2015-04-17 ENCOUNTER — Inpatient Hospital Stay: Payer: Commercial Managed Care - HMO | Admitting: Family Medicine

## 2015-04-17 VITALS — BP 140/84 | HR 62 | Temp 97.7°F | Ht 69.0 in | Wt 211.4 lb

## 2015-04-17 DIAGNOSIS — N5089 Other specified disorders of the male genital organs: Secondary | ICD-10-CM

## 2015-04-17 DIAGNOSIS — M25551 Pain in right hip: Secondary | ICD-10-CM

## 2015-04-17 DIAGNOSIS — F339 Major depressive disorder, recurrent, unspecified: Secondary | ICD-10-CM | POA: Diagnosis not present

## 2015-04-17 DIAGNOSIS — I509 Heart failure, unspecified: Secondary | ICD-10-CM

## 2015-04-17 DIAGNOSIS — I10 Essential (primary) hypertension: Secondary | ICD-10-CM

## 2015-04-17 DIAGNOSIS — R600 Localized edema: Secondary | ICD-10-CM

## 2015-04-17 MED ORDER — TORSEMIDE 20 MG PO TABS: 60.0000 mg | ORAL_TABLET | Freq: Two times a day (BID) | ORAL | Status: DC

## 2015-04-17 MED ORDER — MIRTAZAPINE 15 MG PO TBDP: 15.0000 mg | ORAL_TABLET | Freq: Every day | ORAL | Status: DC
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
## 2015-04-17 NOTE — Assessment & Plan Note (Signed)
Secondary to CHF. Torsemide dose adjusted. Recommend LL elevation. Continue daily weighing. F/U in 2 wks for reassessment.

## 2015-04-17 NOTE — Assessment & Plan Note (Signed)
Likely arthritis. Hip xray ordered. I will call him with result. Consider switching from Tramadol to Oxy. D/C Ibuprofen.

## 2015-04-17 NOTE — Assessment & Plan Note (Signed)
BP looks good. BP medication adjusted as specified above.

## 2015-04-17 NOTE — Assessment & Plan Note (Signed)
Patient currently not suicidal. Depression likely worsened due to recent hospitalization and declined health status. He is already on Trazodone 100 mg BID with no improvement. I added Remeron 15 mg qhs to his medication and did a brief counseling. I will encourage him to speech with our [facility] specialist during his next visit in 2 wks. Return precaution discussed.

## 2015-04-17 NOTE — Assessment & Plan Note (Signed)
Scrotum not examined today. He endorsed his swelling persist. Torsemide dose increased. Continue daily weighing and documentation.

## 2015-04-17 NOTE — Assessment & Plan Note (Signed)
Patient appears stable although still a bit fluid overloaded. Respiratory status looks good. Plan to continue Coreg 25 mg BID. I increased his Torsemide to 60 mg BID since his BP looks ok.  Continue K+ replacement with Kdur 40 mg qd. F/U with cardiologist as scheduled.

## 2015-04-17 NOTE — Patient Instructions (Signed)
It was nice seeing you today. I am glad your weight is steady even though you still have some swelling. Let us increase your Torsemide to 60 mg BID. Continue taking your potassium as well. See your cardiologist soon for your CHF. I have ordered an xray of your right hip. Please go to the hospital to get it done as soon as possible. Continue tramadol for now pending test result. Continue BP and weight check at home. I will see you back in 2 wks.

## 2015-04-17 NOTE — Telephone Encounter (Signed)
HHRN called in about pt's weight, fluid gain, needed input on fluid restrictions, salt intake, etc. I spoke to patient about these issues.  Pt seen today by Dr. Gwendlyn Deutscher (Fam Med) Pt given instructions today to increase torsemide. No significant problems, no acute concerns. Reiterated HF guidelines for salt intake, fluid considerations. Advised to follow Dr. Macario Golds recommendations on torsemide increase. F/u already scheduled w/ Dr. Percival Spanish for 12/2 and advised to keep this appt, call if problems sooner.  Pt voiced understanding.

## 2015-04-20 ENCOUNTER — Other Ambulatory Visit: Payer: Self-pay

## 2015-04-20 DIAGNOSIS — I509 Heart failure, unspecified: Secondary | ICD-10-CM

## 2015-04-20 NOTE — Patient Outreach (Signed)
[facility] [facility]) Care Management  04/20/2015  Patrick [name] 1950-08-26 [id number]   Telephone Screen  Referral Date: 04/15/15 Referral Source: MD office(Dr. Gwendlyn Deutscher) Referral Reason: CHF with fluid retention, DM, needs support at home, needs community resources  Outreach attempt #1 to patient. Patient reached. Screening completed.  Social: Patient resides in the home with his disabled sister. He reports that he does provided care for her. He does not have a caregiver for himself. Patient is independent with ADLs/IADLs. He drives himself to MD appts. He reports one fall within the last three months in which he fell outside the house. He denies any injuries. He does have a cane. He suffers from occasional pain to feet and legs.   Conditions: patient has CHF. He has history of Dm but it is diet controlled and last A1C was 5.0(10/13/14). Patient was recently discharged from hospital on 04/07/15 for CHF exacerbation. He reports that he continues to have ongoing issues with fluid retention and edema especially scrotal edema. His diuretics were recently adjusted. Patient states while in the hospital he had 20lbs of fluid removed. He has scale in the home and weighing daily but states he needs to obtain a better working scale. He is getting [facility] RN services since hospital discharge. Per records he has h/o depression with worsening symptoms per office visit notes. Medication adjustment recently by MD. RN CM unable to assess during this call as patient had to end call early as [facility] SW in the home.  Medications: Patient taking 10 meds per his report. He denies any issues affording meds at this time.  Consent: Patient gave verbal consent for [facility] services.  Plan: RN CM will notify [facility] administrative assistant that patient agreed to services and case opened. RN CM will send [facility] community referral for [facility] and further in home evaluation and assessment. RN CM will send [facility] pharmacy referral for  polypharmacy med review. RN CM confirmed that patient is aware of when to seek medical attention for changes in condition. RN CM provided patient with [facility] contact info.  Enzo Montgomery, RN,BSN,CCM [place] Management Telephonic Care Management Coordinator Direct Phone: [phone] Toll Free: [phone] Fax: [phone]

## 2015-04-28 ENCOUNTER — Other Ambulatory Visit: Payer: Self-pay | Admitting: *Deleted

## 2015-04-28 NOTE — Patient Outreach (Signed)
Referral received from telephonic care manager for home assessment for heart failure management and depression.  Member recently discharged on 11/8 for heart failure exacerbation.  Call placed to member to initiate transition of care program, introduced self and purpose of call, [facility] care management services explained.  Member states that he has been "doing good" since his discharge.  He state that he has a Marine scientist and a physical therapist coming to see him twice a week, but he does not know what company.  According to notes, it is [place].  He reports that he has been following his discharge instructions, including diet and daily weights.  He states that he weighs himself daily, but do not always write it down.  Discussed the importance of documenting the weights in effort to manage his heart failure appropriately and to be able to visualize a trend to discuss with physicians.  Heart failure zones discussed, member verbalizes understanding, specifically about monitoring the weight gain (more than 3 pounds in one day and 5 pounds in one week) and when to notify the physician.  He reports taking all medications as prescribed, denying the need for pharmacy assistance.  He states that he has already had a follow up appointment with his PCP and will have an appointment with his cardiologist this Friday.  He denies any concerns at this time.  He is open to continue weekly transition of care calls, and a home visit, which is scheduled for 12/13.  Provided with contact information, encouraged to contact this care manager with any questions.  [name], BSN, [place] Management  [facility] Care Manager [phone]

## 2015-04-29 ENCOUNTER — Encounter: Payer: Self-pay | Admitting: Podiatry

## 2015-04-29 ENCOUNTER — Ambulatory Visit (INDEPENDENT_AMBULATORY_CARE_PROVIDER_SITE_OTHER): Payer: Commercial Managed Care - HMO | Admitting: Podiatry

## 2015-04-29 DIAGNOSIS — B351 Tinea unguium: Secondary | ICD-10-CM | POA: Diagnosis not present

## 2015-04-29 DIAGNOSIS — M79676 Pain in unspecified toe(s): Secondary | ICD-10-CM

## 2015-04-29 NOTE — Patient Instructions (Signed)
Diabetes and Foot Care Diabetes may cause you to have problems because of poor blood supply (circulation) to your feet and legs. This may cause the skin on your feet to become thinner, break easier, and heal more slowly. Your skin may become dry, and the skin may peel and crack. You may also have nerve damage in your legs and feet causing decreased feeling in them. You may not notice minor injuries to your feet that could lead to infections or more serious problems. Taking care of your feet is one of the most important things you can do for yourself.  HOME CARE INSTRUCTIONS  Wear shoes at all times, even in the house. Do not go barefoot. Bare feet are easily injured.  Check your feet daily for blisters, cuts, and redness. If you cannot see the bottom of your feet, use a mirror or ask someone for help.  Wash your feet with warm water (do not use hot water) and mild soap. Then pat your feet and the areas between your toes until they are completely dry. Do not soak your feet as this can dry your skin.  Apply a moisturizing lotion or petroleum jelly (that does not contain alcohol and is unscented) to the skin on your feet and to dry, brittle toenails. Do not apply lotion between your toes.  Trim your toenails straight across. Do not dig under them or around the cuticle. File the edges of your nails with an emery board or nail file.  Do not cut corns or calluses or try to remove them with medicine.  Wear clean socks or stockings every day. Make sure they are not too tight. Do not wear knee-high stockings since they may decrease blood flow to your legs.  Wear shoes that fit properly and have enough cushioning. To break in new shoes, wear them for just a few hours a day. This prevents you from injuring your feet. Always look in your shoes before you put them on to be sure there are no objects inside.  Do not cross your legs. This may decrease the blood flow to your feet.  If you find a minor scrape,  cut, or break in the skin on your feet, keep it and the skin around it clean and dry. These areas may be cleansed with mild soap and water. Do not cleanse the area with peroxide, alcohol, or iodine.  When you remove an adhesive bandage, be sure not to damage the skin around it.  If you have a wound, look at it several times a day to make sure it is healing.  Do not use heating pads or hot water bottles. They may burn your skin. If you have lost feeling in your feet or legs, you may not know it is happening until it is too late.  Make sure your health care provider performs a complete foot exam at least annually or more often if you have foot problems. Report any cuts, sores, or bruises to your health care provider immediately. SEEK MEDICAL CARE IF:   You have an injury that is not healing.  You have cuts or breaks in the skin.  You have an ingrown nail.  You notice redness on your legs or feet.  You feel burning or tingling in your legs or feet.  You have pain or cramps in your legs and feet.  Your legs or feet are numb.  Your feet always feel cold. SEEK IMMEDIATE MEDICAL CARE IF:   There is increasing redness,   swelling, or pain in or around a wound.  There is a red line that goes up your leg.  Pus is coming from a wound.  You develop a fever or as directed by your health care provider.  You notice a bad smell coming from an ulcer or wound.   This information is not intended to replace advice given to you by your health care provider. Make sure you discuss any questions you have with your health care provider.   Document Released: 05/13/2000 Document Revised: 01/16/2013 Document Reviewed: 10/23/2012 Elsevier Interactive Patient Education 2016 Elsevier Inc.  

## 2015-04-29 NOTE — Progress Notes (Signed)
Patient ID: Patrick [name], male   DOB: 12-Dec-1950, 64 y.o.   MRN: [id number]  Subjective: This patient presents complaining of painful toenails and walking wearing shoes and request nail debridement. Patient also requesting diabetic shoes. He states that he is never taking any medication for diabetes and has been labeled as a prediabetic  Objective: Orientated 3 No open skin lesions bilaterally Pitting edema bilaterally The toenails are elongated, brittle, incurvated, deformed and tender to direct palpation 6-10  Assessment: Symptomatic onychomycoses 6-10  Plan: Debridement toenails 10 mechanically electronically without any bleeding Patient advised that unless he is diagnosed as a diabetic he would not qualify for diabetic shoes  Reappoint 3 months

## 2015-05-01 ENCOUNTER — Encounter: Payer: Self-pay | Admitting: *Deleted

## 2015-05-01 ENCOUNTER — Ambulatory Visit (INDEPENDENT_AMBULATORY_CARE_PROVIDER_SITE_OTHER): Payer: Commercial Managed Care - HMO | Admitting: Cardiology

## 2015-05-01 ENCOUNTER — Encounter: Payer: Self-pay | Admitting: Cardiology

## 2015-05-01 VITALS — BP 130/84 | HR 68 | Ht 69.0 in | Wt 203.4 lb

## 2015-05-01 DIAGNOSIS — I5032 Chronic diastolic (congestive) heart failure: Secondary | ICD-10-CM | POA: Diagnosis not present

## 2015-05-01 DIAGNOSIS — I429 Cardiomyopathy, unspecified: Secondary | ICD-10-CM | POA: Diagnosis not present

## 2015-05-01 DIAGNOSIS — I10 Essential (primary) hypertension: Secondary | ICD-10-CM

## 2015-05-01 LAB — BASIC METABOLIC PANEL
BUN: 18 mg/dL (ref 7–25)
CALCIUM: 9 mg/dL (ref 8.6–10.3)
CO2: 33 mmol/L — ABNORMAL HIGH (ref 20–31)
CREATININE: 1.31 mg/dL — AB (ref 0.70–1.25)
Chloride: 103 mmol/L (ref 98–110)
GLUCOSE: 80 mg/dL (ref 65–99)
Potassium: 3.4 mmol/L — ABNORMAL LOW (ref 3.5–5.3)
Sodium: 145 mmol/L (ref 135–146)

## 2015-05-01 NOTE — Progress Notes (Signed)
HPI The patient presents for follow-up after a hospitalization for acute on chronic heart failure. I reviewed these records. He says that his weights went up slowly. He was diureses and feels much better at home. He is watching his salt better. He denies any acute shortness of breath, PND or orthopnea. He denies any chest pressure, neck or arm discomfort. He's had no palpitations, presyncope or syncope.  Of note following discharge his weights did start to creep up but now after his primary physician increased his diuretic it has come back down.   No Known Allergies  Current Outpatient Prescriptions  Medication Sig Dispense Refill  . Ascorbic Acid (VITAMIN C) 1000 MG tablet Take 2,000 mg by mouth 2 (two) times daily.    Marland Kitchen aspirin EC 81 MG EC tablet Take 1 tablet (81 mg total) by mouth daily. 30 tablet 0  . carvedilol (COREG) 25 MG tablet Take 1 tablet (25 mg total) by mouth 2 (two) times daily. 180 tablet 3  . EQL SENNA LAXATIVE 8.6 MG tablet Take 8.6 mg by mouth 3 (three) times daily. Pt states he rotates between 2 and 3 tablets nightly    . ferrous sulfate 325 (65 FE) MG tablet Take 1 tablet (325 mg total) by mouth daily with breakfast. 30 tablet 3  . finasteride (PROSCAR) 5 MG tablet TAKE 1 TABLET BY MOUTH DAILY 90 tablet 1  . losartan (COZAAR) 50 MG tablet TAKE 1 TABLET BY MOUTH EVERY DAY 90 tablet 1  . lovastatin (MEVACOR) 20 MG tablet TAKE 1 TABLET BY MOUTH AT BEDTIME 90 tablet 1  . mirtazapine (REMERON SOL-TAB) 15 MG disintegrating tablet Take 1 tablet (15 mg total) by mouth at bedtime. 30 tablet 3  . Multiple Vitamins-Minerals (MULTIVITAMIN WITH MINERALS) tablet Take 1 tablet by mouth daily.    Marland Kitchen omega-3 acid ethyl esters (LOVAZA) 1 G capsule TAKE 2 CAPSULES BY MOUTH TWICE DAILY 360 capsule 1  . potassium chloride SA (K-DUR,KLOR-CON) 20 MEQ tablet TAKE 2 TABLET BY MOUTH EVERY DAY 180 tablet 1  . tamsulosin (FLOMAX) 0.4 MG CAPS capsule Take 0.4 mg by mouth daily.     Marland Kitchen torsemide  (DEMADEX) 20 MG tablet Take 3 tablets (60 mg total) by mouth 2 (two) times daily. 180 tablet 3  . traMADol (ULTRAM) 50 MG tablet TAKE 1 TABLET BY MOUTH EVERY 8 HOURS AS NEEDED FOR PAIN 90 tablet 2  . traZODone (DESYREL) 100 MG tablet Take 1 tablet (100 mg total) by mouth 2 (two) times daily. 180 tablet 1   No current facility-administered medications for this visit.    Past Medical History  Diagnosis Date  . CHF (congestive heart failure) (HCC)     Preserved EF  . Hypertension   . Hyperlipidemia   . Enlarged prostate   . Pneumonia 05/2014  . DM II (diabetes mellitus, type II), controlled ([place])     Diet controlled.   . Degenerative joint disease of knee, right   . Arthritis     "right hand; right knee" (06/19/2014)    Past Surgical History  Procedure Laterality Date  . Inguinal hernia repair Left 1990's    ROS:  As stated in the HPI and negative for all other systems.  PHYSICAL EXAM BP 130/84 mmHg  Pulse 68  Ht 5\' 9"  (1.753 m)  Wt 203 lb 7 oz (92.279 kg)  BMI 30.03 kg/m2 GENERAL:  Well appearing HEENT:  Pupils equal round and reactive, fundi not visualized, oral mucosa unremarkable, poor dentition.  NECK:  Positive jugular venous distention to the jaw at 45 degrees, waveform within normal limits, carotid upstroke brisk and symmetric, no bruits, no thyromegaly LUNGS:  Clear to auscultation bilaterally BACK:  No CVA tenderness CHEST:  Unremarkable HEART:  PMI not displaced or sustained,S1 within normal limits, fixed split S2, no S3, no S4, no clicks, no rubs, no murmurs ABD:  Flat, positive bowel sounds normal in frequency in pitch, no bruits, no rebound, no guarding, no midline pulsatile mass, no hepatomegaly, no splenomegaly EXT:  2 plus pulses throughout, moderate edema bilateral legs, no cyanosis no clubbing    ASSESSMENT AND PLAN  Elevated troponin:  He had an anbormal [employer].  Cath was suggested.  However, he has not wanted to have this. We will continue to  manage him medically.    Mild renal insufficiency/hypokalemia:  I will check a basic metabolic profile.  This was not checked recently and it was 3.1 in the hospital.  Previously reduced ejection fraction:  He did not have an echo in the hospital and I will repeat one next month as a will be one year since his previous. His EF has been preserved on echo although it was slightly low on stress perfusion study.  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  However, he does not want further testing and without this I would not consider any vasocative meds for his pulm HTN.   Hospital records from November reviewed.

## 2015-05-01 NOTE — Patient Instructions (Signed)
Medication Instructions:   NO CHANGE  Labwork:  Your physician recommends that you HAVE LAB WORK TODAY  Testing/Procedures:  Your physician has requested that you have an echocardiogram. Echocardiography is a painless test that uses sound waves to create images of your heart. It provides your doctor with information about the size and shape of your heart and how well your heart's chambers and valves are working. This procedure takes approximately one hour. There are no restrictions for this procedure.SCHEDULE IN [place]    Follow-Up:  Your physician recommends that you schedule a follow-up appointment in: [place] AN EXTENDER AFTER ECHO  If you need a refill on your cardiac medications before your next appointment, please call your pharmacy.

## 2015-05-04 ENCOUNTER — Telehealth: Payer: Self-pay | Admitting: *Deleted

## 2015-05-04 DIAGNOSIS — Z79899 Other long term (current) drug therapy: Secondary | ICD-10-CM

## 2015-05-04 NOTE — Telephone Encounter (Signed)
-----   Message from Minus Breeding, MD sent at 05/02/2015 10:39 PM EST ----- Potassium is low.  Please have him start taking an extra 20 meq of potassium every day.  Repeat BMET in two weeks.  Call Mr. Iuliano with the results and send results to Andrena Mews, MD

## 2015-05-05 ENCOUNTER — Other Ambulatory Visit: Payer: Self-pay | Admitting: *Deleted

## 2015-05-05 ENCOUNTER — Ambulatory Visit (INDEPENDENT_AMBULATORY_CARE_PROVIDER_SITE_OTHER): Payer: Commercial Managed Care - HMO | Admitting: Family Medicine

## 2015-05-05 ENCOUNTER — Encounter: Payer: Self-pay | Admitting: Family Medicine

## 2015-05-05 VITALS — BP 136/82 | HR 63 | Temp 97.8°F | Ht 69.0 in | Wt 203.3 lb

## 2015-05-05 DIAGNOSIS — I10 Essential (primary) hypertension: Secondary | ICD-10-CM | POA: Diagnosis not present

## 2015-05-05 DIAGNOSIS — I5032 Chronic diastolic (congestive) heart failure: Secondary | ICD-10-CM | POA: Diagnosis not present

## 2015-05-05 DIAGNOSIS — F331 Major depressive disorder, recurrent, moderate: Secondary | ICD-10-CM

## 2015-05-05 DIAGNOSIS — M25551 Pain in right hip: Secondary | ICD-10-CM

## 2015-05-05 DIAGNOSIS — E876 Hypokalemia: Secondary | ICD-10-CM

## 2015-05-05 MED ORDER — TRAZODONE HCL 100 MG PO TABS: 100.0000 mg | ORAL_TABLET | Freq: Two times a day (BID) | ORAL | Status: DC

## 2015-05-05 NOTE — Assessment & Plan Note (Signed)
Potassium increased to 60  Mg qd from 40 due to low potassium checked by his cardiologist. Per patient, he has an up coming lab appointment at his cardiologist office in 2 wks for K+ check. F/U as needed.

## 2015-05-05 NOTE — Assessment & Plan Note (Signed)
BP looks good despite increased dose of Torsemide. Continue Coreg, Torsemide and Losartan. Continue home BP monitoring. Return precaution discussed.

## 2015-05-05 NOTE — Assessment & Plan Note (Signed)
No acute exacerbation. He is compliant with his meds. He recently saw his cardiologist. Note reviewed by me. Continue current dose of Torsemide and Coreg. Continue daily weight check. Return precaution discussed. Cardiology f/u as instructed.

## 2015-05-05 NOTE — Assessment & Plan Note (Signed)
Gradually improving on Remeron and Trazodone. PHQ9 score decreased from 10 to 5 today. He is currently not suicidal. I refilled his Trazodone. I recommended counseling by our [facility] but he declined today. F/U as needed.

## 2015-05-05 NOTE — Progress Notes (Signed)
Subjective:     Patient ID: Patrick [name], male   DOB: 03/12/1951, 64 y.o.   MRN: [id number]  HPI Depression:Here for follow up after starting Remeron, he feels this medication helps a lot. He continues to take his Trazodone as well and is needing refill today. Denies any suicidal ideation. CHF/HTN: He is compliant with medication. Now on Torsemide 60 mg BID and Coreg 25 mg BID. His weight this morning at home was 201.6 which was a drop from 214 from last visit. He denies any SOB, no chest pain. Leg and scrotal swelling improved some. He is also on Losartan 50 mg qd for his BP Hip pain:Still having right about 4/10 in severity this morning. He is yet to go for his hip xray. Hypokalemia: His Potassium was low during his last visit to the cardiologist hence his potassium was upped to 60 meq/L per day by his cardiologist. He was instructed to return to in 2 wks for recheck at the cardiologist's office.  Current Outpatient Prescriptions on File Prior to Visit  Medication Sig Dispense Refill  . aspirin EC 81 MG EC tablet Take 1 tablet (81 mg total) by mouth daily. 30 tablet 0  . carvedilol (COREG) 25 MG tablet Take 1 tablet (25 mg total) by mouth 2 (two) times daily. 180 tablet 3  . ferrous sulfate 325 (65 FE) MG tablet Take 1 tablet (325 mg total) by mouth daily with breakfast. 30 tablet 3  . finasteride (PROSCAR) 5 MG tablet TAKE 1 TABLET BY MOUTH DAILY 90 tablet 1  . losartan (COZAAR) 50 MG tablet TAKE 1 TABLET BY MOUTH EVERY DAY 90 tablet 1  . lovastatin (MEVACOR) 20 MG tablet TAKE 1 TABLET BY MOUTH AT BEDTIME 90 tablet 1  . mirtazapine (REMERON SOL-TAB) 15 MG disintegrating tablet Take 1 tablet (15 mg total) by mouth at bedtime. 30 tablet 3  . Multiple Vitamins-Minerals (MULTIVITAMIN WITH MINERALS) tablet Take 1 tablet by mouth daily.    Marland Kitchen omega-3 acid ethyl esters (LOVAZA) 1 G capsule TAKE 2 CAPSULES BY MOUTH TWICE DAILY 360 capsule 1  . potassium chloride SA (K-DUR,KLOR-CON) 20 MEQ tablet TAKE 2  TABLET BY MOUTH EVERY DAY (Patient taking differently: TAKE 3 TABLET BY MOUTH EVERY DAY) 180 tablet 1  . tamsulosin (FLOMAX) 0.4 MG CAPS capsule Take 0.4 mg by mouth daily.     Marland Kitchen torsemide (DEMADEX) 20 MG tablet Take 3 tablets (60 mg total) by mouth 2 (two) times daily. 180 tablet 3  . traMADol (ULTRAM) 50 MG tablet TAKE 1 TABLET BY MOUTH EVERY 8 HOURS AS NEEDED FOR PAIN 90 tablet 2  . traZODone (DESYREL) 100 MG tablet Take 1 tablet (100 mg total) by mouth 2 (two) times daily. 180 tablet 1  . Ascorbic Acid (VITAMIN C) 1000 MG tablet Take 2,000 mg by mouth 2 (two) times daily.    Marland Kitchen EQL SENNA LAXATIVE 8.6 MG tablet Take 8.6 mg by mouth 3 (three) times daily. Pt states he rotates between 2 and 3 tablets nightly     No current facility-administered medications on file prior to visit.   Past Medical History  Diagnosis Date  . CHF (congestive heart failure) (HCC)     Preserved EF  . Hypertension   . Hyperlipidemia   . Enlarged prostate   . Pneumonia 05/2014  . DM II (diabetes mellitus, type II), controlled ([place])     Diet controlled.   . Degenerative joint disease of knee, right   . Arthritis     "  right hand; right knee" (06/19/2014)      Review of Systems  Respiratory: Negative.   Cardiovascular: Negative.   Gastrointestinal: Negative.   Musculoskeletal:       Right hip pain  Psychiatric/Behavioral: Negative for suicidal ideas, hallucinations, sleep disturbance, self-injury and agitation.  All other systems reviewed and are negative.      Filed Vitals:   05/05/15 1137  BP: 136/82  Pulse: 63  Temp: 97.8 F (36.6 C)  TempSrc: Oral  Height: 5\' 9"  (1.753 m)  Weight: 203 lb 5 oz (92.222 kg)    Objective:   Physical Exam  Constitutional: He is oriented to person, place, and time. He appears well-developed. No distress.  Cardiovascular: Normal rate, regular rhythm and normal heart sounds.   No murmur heard. Pulmonary/Chest: Effort normal and breath sounds normal. No respiratory  distress. He has no wheezes.  Abdominal: Soft. Bowel sounds are normal. He exhibits no distension and no mass. There is no tenderness.  Musculoskeletal: Normal range of motion. He exhibits no edema.  Neurological: He is alert and oriented to person, place, and time.  Psychiatric: His speech is normal and behavior is normal. Judgment and thought content normal. His mood appears not anxious. Cognition and memory are normal. He does not exhibit a depressed mood.  Nursing note and vitals reviewed.      Assessment:     MDD. CHF HTN Right Hip pain Hypokalemia     Plan:     Check problem list.

## 2015-05-05 NOTE — Assessment & Plan Note (Signed)
He did not go for his hip xray yet. He will do so this week. Continue Tramadol prn.

## 2015-05-05 NOTE — Patient Instructions (Signed)
DASH Eating Plan  DASH stands for "Dietary Approaches to Stop Hypertension." The DASH eating plan is a healthy eating plan that has been shown to reduce high blood pressure (hypertension). Additional health benefits may include reducing the risk of type 2 diabetes mellitus, heart disease, and stroke. The DASH eating plan may also help with weight loss.  WHAT DO I NEED TO KNOW ABOUT THE DASH EATING PLAN?  For the DASH eating plan, you will follow these general guidelines:  · Choose foods with a percent daily value for sodium of less than 5% (as listed on the food label).  · Use salt-free seasonings or herbs instead of table salt or sea salt.  · Check with your health care provider or pharmacist before using salt substitutes.  · Eat lower-sodium products, often labeled as "lower sodium" or "no salt added."  · Eat fresh foods.  · Eat more vegetables, fruits, and low-fat dairy products.  · Choose whole grains. Look for the word "whole" as the first word in the ingredient list.  · Choose fish and skinless chicken or turkey more often than red meat. Limit fish, poultry, and meat to 6 oz (170 g) each day.  · Limit sweets, desserts, sugars, and sugary drinks.  · Choose heart-healthy fats.  · Limit cheese to 1 oz (28 g) per day.  · Eat more home-cooked food and less restaurant, buffet, and fast food.  · Limit fried foods.  · Cook foods using methods other than frying.  · Limit canned vegetables. If you do use them, rinse them well to decrease the sodium.  · When eating at a restaurant, ask that your food be prepared with less salt, or no salt if possible.  WHAT FOODS CAN I EAT?  Seek help from a dietitian for individual calorie needs.  Grains  Whole grain or whole wheat bread. Brown rice. Whole grain or whole wheat pasta. Quinoa, bulgur, and whole grain cereals. Low-sodium cereals. Corn or whole wheat flour tortillas. Whole grain cornbread. Whole grain crackers. Low-sodium crackers.  Vegetables  Fresh or frozen vegetables  (raw, steamed, roasted, or grilled). Low-sodium or reduced-sodium tomato and vegetable juices. Low-sodium or reduced-sodium tomato sauce and paste. Low-sodium or reduced-sodium canned vegetables.   Fruits  All fresh, canned (in natural juice), or frozen fruits.  Meat and Other Protein Products  Ground beef (85% or leaner), grass-fed beef, or beef trimmed of fat. Skinless chicken or turkey. Ground chicken or turkey. Pork trimmed of fat. All fish and seafood. Eggs. Dried beans, peas, or lentils. Unsalted nuts and seeds. Unsalted canned beans.  Dairy  Low-fat dairy products, such as skim or 1% milk, 2% or reduced-fat cheeses, low-fat ricotta or cottage cheese, or plain low-fat yogurt. Low-sodium or reduced-sodium cheeses.  Fats and Oils  Tub margarines without trans fats. Light or reduced-fat mayonnaise and salad dressings (reduced sodium). Avocado. Safflower, olive, or canola oils. Natural peanut or almond butter.  Other  Unsalted popcorn and pretzels.  The items listed above may not be a complete list of recommended foods or beverages. Contact your dietitian for more options.  WHAT FOODS ARE NOT RECOMMENDED?  Grains  White bread. White pasta. White rice. Refined cornbread. Bagels and croissants. Crackers that contain trans fat.  Vegetables  Creamed or fried vegetables. Vegetables in a cheese sauce. Regular canned vegetables. Regular canned tomato sauce and paste. Regular tomato and vegetable juices.  Fruits  Dried fruits. Canned fruit in light or heavy syrup. Fruit juice.  Meat and Other Protein   Products  Fatty cuts of meat. Ribs, chicken wings, bacon, sausage, bologna, salami, chitterlings, fatback, hot dogs, bratwurst, and packaged luncheon meats. Salted nuts and seeds. Canned beans with salt.  Dairy  Whole or 2% milk, cream, half-and-half, and cream cheese. Whole-fat or sweetened yogurt. Full-fat cheeses or blue cheese. Nondairy creamers and whipped toppings. Processed cheese, cheese spreads, or cheese  curds.  Condiments  Onion and garlic salt, seasoned salt, table salt, and sea salt. Canned and packaged gravies. Worcestershire sauce. Tartar sauce. Barbecue sauce. Teriyaki sauce. Soy sauce, including reduced sodium. Steak sauce. Fish sauce. Oyster sauce. Cocktail sauce. Horseradish. Ketchup and mustard. Meat flavorings and tenderizers. Bouillon cubes. Hot sauce. Tabasco sauce. Marinades. Taco seasonings. Relishes.  Fats and Oils  Butter, stick margarine, lard, shortening, ghee, and bacon fat. Coconut, palm kernel, or palm oils. Regular salad dressings.  Other  Pickles and olives. Salted popcorn and pretzels.  The items listed above may not be a complete list of foods and beverages to avoid. Contact your dietitian for more information.  WHERE CAN I FIND MORE INFORMATION?  National Heart, Lung, and [employer] Institute: www.nhlbi.nih.gov/health/health-topics/topics/dash/     This information is not intended to replace advice given to you by your health care provider. Make sure you discuss any questions you have with your health care provider.     Document Released: 05/05/2011 Document Revised: 06/06/2014 Document Reviewed: 03/20/2013  Elsevier Interactive Patient Education ©2016 Elsevier Inc.

## 2015-05-05 NOTE — Patient Outreach (Signed)
Call received from member questioning appointments with this care manager that were placed on his after visit summary following his office visit today.  Member made aware that the schedule showed a telephone outreach scheduled for this Friday and a home visit scheduled for next Tuesday.  He confirms visit for Tuesday, and since he was already engaged in conversation today, this care manager discussed his current status in reference to his transition since being discharged.  He states that is "getting better, I'm losing more weight."  He reports that his potassium level was low and that his dose of potassium replacement was adjusted.  He denies any shortness of breath or chest discomfort.  Encouraged to contact this care manager with any concerns.  Will proceed with initial home visit next week.  [name], BSN, [facility]  [facility] Care Manager [phone]

## 2015-05-06 ENCOUNTER — Other Ambulatory Visit: Payer: Self-pay | Admitting: Pharmacist

## 2015-05-06 NOTE — Patient Outreach (Signed)
[facility]) Care Management  [place]   05/06/2015  Patrick [name] 1951/01/01 [id number]  Subjective: Patrick [name] is a 64 y.o. male referred to pharmacy for medication review for polypharmacy. Reviewed patient's medication, allergy and problem lists in order to complete this evaluation.   Objective:   Current Medications: Current Outpatient Prescriptions  Medication Sig Dispense Refill  . Ascorbic Acid (VITAMIN C) 1000 MG tablet Take 2,000 mg by mouth 2 (two) times daily.    Marland Kitchen aspirin EC 81 MG EC tablet Take 1 tablet (81 mg total) by mouth daily. 30 tablet 0  . carvedilol (COREG) 25 MG tablet Take 1 tablet (25 mg total) by mouth 2 (two) times daily. 180 tablet 3  . EQL SENNA LAXATIVE 8.6 MG tablet Take 8.6 mg by mouth 3 (three) times daily. Pt states he rotates between 2 and 3 tablets nightly    . ferrous sulfate 325 (65 FE) MG tablet Take 1 tablet (325 mg total) by mouth daily with breakfast. 30 tablet 3  . finasteride (PROSCAR) 5 MG tablet TAKE 1 TABLET BY MOUTH DAILY 90 tablet 1  . losartan (COZAAR) 50 MG tablet TAKE 1 TABLET BY MOUTH EVERY DAY 90 tablet 1  . lovastatin (MEVACOR) 20 MG tablet TAKE 1 TABLET BY MOUTH AT BEDTIME 90 tablet 1  . mirtazapine (REMERON SOL-TAB) 15 MG disintegrating tablet Take 1 tablet (15 mg total) by mouth at bedtime. 30 tablet 3  . Multiple Vitamins-Minerals (MULTIVITAMIN WITH MINERALS) tablet Take 1 tablet by mouth daily.    Marland Kitchen omega-3 acid ethyl esters (LOVAZA) 1 G capsule TAKE 2 CAPSULES BY MOUTH TWICE DAILY 360 capsule 1  . potassium chloride SA (K-DUR,KLOR-CON) 20 MEQ tablet TAKE 2 TABLET BY MOUTH EVERY DAY (Patient taking differently: TAKE 3 TABLET BY MOUTH EVERY DAY) 180 tablet 1  . tamsulosin (FLOMAX) 0.4 MG CAPS capsule Take 0.4 mg by mouth daily.     Marland Kitchen torsemide (DEMADEX) 20 MG tablet Take 3 tablets (60 mg total) by mouth 2 (two) times daily. 180 tablet 3  . traMADol (ULTRAM) 50 MG tablet TAKE 1 TABLET BY MOUTH EVERY 8 HOURS  AS NEEDED FOR PAIN 90 tablet 2  . traZODone (DESYREL) 100 MG tablet Take 1 tablet (100 mg total) by mouth 2 (two) times daily. 180 tablet 1   No current facility-administered medications for this visit.    Assessment:  Drugs sorted by system:  Neurologic/Psychologic: mirtazapine, trazodone  Cardiovascular: aspirin, carvedilol, lovastatin, Lovaza, potassium, torsemide  Gastrointestinal: Senna    Urinary/Prostate: finasteride, tamsulosin  Pain: tramadol  Vitamins/Minerals: ascorbic acid, ferrous sulfate, multivitamin   Duplications in therapy: none noted Gaps in therapy: none noted Medications to avoid in the elderly: none noted Drug interactions: Marland Kitchen Mirtazapine + tramadol: CNS Depressants may enhance the CNS depressant effect of Analgesics   Plan:  1) No significant issues noted in this review. Will close pharmacy episode for now.  Harlow Asa, PharmD Clinical Pharmacist [place] [facility] [phone]

## 2015-05-08 ENCOUNTER — Ambulatory Visit: Payer: Commercial Managed Care - HMO | Admitting: *Deleted

## 2015-05-11 ENCOUNTER — Other Ambulatory Visit: Payer: Self-pay | Admitting: *Deleted

## 2015-05-11 ENCOUNTER — Ambulatory Visit (HOSPITAL_COMMUNITY)
Admission: RE | Admit: 2015-05-11 | Discharge: 2015-05-11 | Disposition: A | Discharge status: Home / Self Care | Payer: Commercial Managed Care - HMO | Source: Ambulatory Visit | Attending: Family Medicine | Admitting: Family Medicine

## 2015-05-11 ENCOUNTER — Other Ambulatory Visit (HOSPITAL_COMMUNITY): Payer: Self-pay | Admitting: *Deleted

## 2015-05-11 DIAGNOSIS — M1611 Unilateral primary osteoarthritis, right hip: Secondary | ICD-10-CM | POA: Insufficient documentation

## 2015-05-11 DIAGNOSIS — M25551 Pain in right hip: Secondary | ICD-10-CM

## 2015-05-11 NOTE — Progress Notes (Signed)
Received call from rad tech Ivin Booty and order needed to be changed from dg artho hip to hip 2-3 views.  Order changed in epic and tech is aware.  Patient is there now. Jazmin Hartsell,CMA

## 2015-05-11 NOTE — Progress Notes (Signed)
Thanks a lot 

## 2015-05-12 ENCOUNTER — Encounter: Payer: Self-pay | Admitting: *Deleted

## 2015-05-12 ENCOUNTER — Telehealth: Payer: Self-pay | Admitting: Family Medicine

## 2015-05-12 ENCOUNTER — Telehealth: Payer: Self-pay | Admitting: Cardiology

## 2015-05-12 ENCOUNTER — Other Ambulatory Visit: Payer: Self-pay | Admitting: *Deleted

## 2015-05-12 MED ORDER — OXYCODONE-ACETAMINOPHEN 5-325 MG PO TABS: 1.0000 | ORAL_TABLET | Freq: Three times a day (TID) | ORAL | Status: DC | PRN

## 2015-05-12 MED ORDER — TORSEMIDE 20 MG PO TABS: ORAL_TABLET | ORAL | Status: DC

## 2015-05-12 NOTE — Telephone Encounter (Signed)
Wife called because Patrick [name] has gained 2 LB overnight and 5 LBS over a week. He was at 201.6 LBS and now is 206.8 LBS. jw

## 2015-05-12 NOTE — Telephone Encounter (Signed)
Advise patient to limit fluid intake to about 1 L per day or less. Continue current dose of Torsemide. Continue weight monitoring and come for nurse visit this Friday or earliest next week. Advised to go to the ED if having SOB or weakness.

## 2015-05-12 NOTE — Telephone Encounter (Signed)
Will forward to MD. Mischell Branford,CMA  

## 2015-05-12 NOTE — Telephone Encounter (Signed)
Received call from [place] with [facility] calling to report last week patient weighed 201.6 today he weighs 206.8.No sob.No chest pain.Increase swelling in both lower legs and ankles.Spoke to DOD Dr.Berry he advised to increase Torsemide to 80 mg twice a day and schedule pt appointment with extender.Appointment scheduled with Tarri Fuller PA 05/18/15 at 2:00 pm at [facility] office.

## 2015-05-12 NOTE — Telephone Encounter (Signed)
Hip xray report discussed with patient. I recommended PT referral which he agreed with. His tramadol is not helping with pain. I will switch him to Oxycodone/Acetaminophen May D/C Tramadol I will have him sign pain contract at next visit.

## 2015-05-12 NOTE — Telephone Encounter (Signed)
Spoke with sister ok per DPR and patient who was speaking with [facility] home nurse.  They are aware of instructions and nurse visit was made for Friday.  Warning signs were made aware too.  Jazmin Hartsell,CMA

## 2015-05-12 NOTE — Patient Outreach (Signed)
[facility]) Care Management   05/12/2015  Patrick [name] Dec 12, 1950 [id number]  Patrick [name] is an 64 y.o. male  Subjective:   Member states that he is "not too good today.  My knee is hurting.  I called the doctor and they have a prescription for some pain medicine ready for me to pick up."  He denies any chest discomfort, shortness of breath, or swelling.  Objective:   Review of Systems  Constitutional: Negative.   HENT: Negative.   Eyes: Negative.   Respiratory: Negative.   Cardiovascular: Negative.   Gastrointestinal: Negative.   Genitourinary: Negative.   Musculoskeletal: Positive for joint pain.  Skin: Negative.   Neurological: Negative.   Endo/Heme/Allergies: Negative.   Psychiatric/Behavioral: Negative.     Physical Exam  Constitutional: He is oriented to person, place, and time. He appears well-developed and well-nourished.  Neck: Normal range of motion.  Cardiovascular: Normal rate, regular rhythm and normal heart sounds.   Respiratory: Effort normal and breath sounds normal.  GI: Soft. Bowel sounds are normal.  Musculoskeletal: Normal range of motion.  Neurological: He is alert and oriented to person, place, and time.  Skin: Skin is warm and dry.   BP 134/94 mmHg  Pulse 62  Temp(Src) 97.8 F (36.6 C)  Resp 16  Ht 1.753 m (_0 )  Wt 206 lb 12.8 oz (93.804 kg)  BMI 30.53 kg/m2  SpO2 97%  Current Medications:   Current Outpatient Prescriptions  Medication Sig Dispense Refill  . Ascorbic Acid (VITAMIN C) 1000 MG tablet Take 2,000 mg by mouth 2 (two) times daily.    Marland Kitchen aspirin EC 81 MG EC tablet Take 1 tablet (81 mg total) by mouth daily. 30 tablet 0  . carvedilol (COREG) 25 MG tablet Take 1 tablet (25 mg total) by mouth 2 (two) times daily. 180 tablet 3  . EQL SENNA LAXATIVE 8.6 MG tablet Take 8.6 mg by mouth 3 (three) times daily. Pt states he rotates between 2 and 3 tablets nightly    . ferrous sulfate 325 (65 FE) MG tablet Take 1 tablet  (325 mg total) by mouth daily with breakfast. 30 tablet 3  . finasteride (PROSCAR) 5 MG tablet TAKE 1 TABLET BY MOUTH DAILY 90 tablet 1  . losartan (COZAAR) 50 MG tablet TAKE 1 TABLET BY MOUTH EVERY DAY 90 tablet 1  . lovastatin (MEVACOR) 20 MG tablet TAKE 1 TABLET BY MOUTH AT BEDTIME 90 tablet 1  . mirtazapine (REMERON SOL-TAB) 15 MG disintegrating tablet Take 1 tablet (15 mg total) by mouth at bedtime. 30 tablet 3  . Multiple Vitamins-Minerals (MULTIVITAMIN WITH MINERALS) tablet Take 1 tablet by mouth daily.    Marland Kitchen omega-3 acid ethyl esters (LOVAZA) 1 G capsule TAKE 2 CAPSULES BY MOUTH TWICE DAILY 360 capsule 1  . oxyCODONE-acetaminophen (PERCOCET/ROXICET) 5-325 MG tablet Take 1 tablet by mouth every 8 (eight) hours as needed for severe pain. 90 tablet 0  . potassium chloride SA (K-DUR,KLOR-CON) 20 MEQ tablet TAKE 2 TABLET BY MOUTH EVERY DAY (Patient taking differently: TAKE 3 TABLET BY MOUTH EVERY DAY) 180 tablet 1  . tamsulosin (FLOMAX) 0.4 MG CAPS capsule Take 0.4 mg by mouth daily.     Marland Kitchen torsemide (DEMADEX) 20 MG tablet Take 3 tablets (60 mg total) by mouth 2 (two) times daily. 180 tablet 3  . traZODone (DESYREL) 100 MG tablet Take 1 tablet (100 mg total) by mouth 2 (two) times daily. 180 tablet 1   No current facility-administered  medications for this visit.    Functional Status:   In your present state of health, do you have any difficulty performing the following activities: 05/12/2015 04/03/2015  Hearing? N N  Vision? Y N  Difficulty concentrating or making decisions? Y N  Walking or climbing stairs? N N  Dressing or bathing? N N  Doing errands, shopping? N N  Preparing Food and eating ? N -  Using the Toilet? N -  In the past six months, have you accidently leaked urine? Y -  Do you have problems with loss of bowel control? N -  Managing your Medications? N -  Managing your Finances? N -  Housekeeping or managing your Housekeeping? N -    Fall/Depression Screening:    PHQ  2/9 Scores 05/05/2015 05/05/2015 04/17/2015 03/30/2015 10/03/2014 08/29/2014 07/01/2014  PHQ - 2 Score _0 0 2 2  PHQ- 9 Score 5 - 10 - - 7 8   Fall Risk  05/12/2015 05/05/2015 04/17/2015 03/30/2015 10/03/2014  Falls in the past year? _1   Number falls in past yr: 2 or more 2 or more 2 or more 2 or more 1  Injury with Fall? No No No No Yes  Risk Factor Category  High Fall Risk - - - -  Risk for fall due to : History of fall(s) - - - -  Follow up Education provided;Falls prevention discussed - - - -    Assessment:    Member is able to report that he has gained more than 5 pounds in the past week, and is in the process of calling his provider at the time of this care manager's arrival.  Member is instructed by provider to decrease fluid intake and to come to the office for a weight check with the nurse on this Friday.  He already has an appointment with the cardiologist next Monday.  He states that he was told last week to increase his Torsemide to 80 mg twice a day, which he has done.  He reports that he has also tried his best to adhere to his 48 oz/day fluid restriction.  He states that it is very hard especially taking medications several times a day, "especially those big potassium pills."  He reports that he will continue to do is best to follow instructions.   He states that takes all of his medications as prescribed daily, and also weighs himself daily but is in need of a more reliable scale.  Made aware that [facility] is able to provide a scale, however none is available at this time as they have been ordered.  He denies any other needs or concerns with the exception of needing grab bars in the bathroom (more for his sister than for himself).  Will discuss this with the Education officer, museum.  Discussed with member the completion of his transition of care program, he denies the need for ongoing home visits, but welcomes involvement with the health coach.  Provided with a new pill box (his  current one is old) and a [facility] calendar tool for recording weights and blood pressures.  Provided with contact information, encouraged to contact with any questions.  Plan:   Will consult with social worker regarding providing member with a scale and providing resources for grab bars in the home. Will refer to health coach once all coordination is complete.  [facility] CM Care Plan Problem One        Most Recent Value  Care Plan Problem One  Recent Hospitalization for heart failure   Role Documenting the Problem One  Care Management [place] for Problem One  Active   [facility] Long Term Goal (31-90 days)  Member will not be readmited to hospital within the next 31 days   [facility] Long Term Goal Start Date  04/08/15   [facility] Long Term Goal Met Date  05/12/15   Interventions for Problem One Long Term Goal  Discussed with member the importance of following discharge instructions, including follow up appointments, medications, diet, and home health involvement, to decrease the risk of readmission   [facility] CM Short Term Goal #1 (0-30 days)  Member will report taking all medications as prescribed for the next 4 weeks   [facility] CM Short Term Goal #1 Start Date  04/28/15   [facility] CM Short Term Goal #1 Met Date  05/12/15   Interventions for Short Term Goal #1  Discussed with member the importance of following discharge instructions, including follow up appointments, medications, diet, and home health involvement, to decrease the risk of readmission   [facility] CM Short Term Goal #2 (0-30 days)  member will weigh self and record readings daily for the next 4 weeks   [facility] CM Short Term Goal #2 Start Date  04/28/15   [facility] CM Short Term Goal #2 Met Date  05/12/15   Interventions for Short Term Goal #2  Discussed with member the importance of following discharge instructions, including follow up appointments, medications, diet, and home health involvement, to decrease the risk of readmission     [name], BSN, [place] Manager [phone]

## 2015-05-13 NOTE — Telephone Encounter (Signed)
This appears to be Dr. Percival Spanish patient

## 2015-05-18 ENCOUNTER — Encounter: Payer: Self-pay | Admitting: Physician Assistant

## 2015-05-18 ENCOUNTER — Ambulatory Visit (INDEPENDENT_AMBULATORY_CARE_PROVIDER_SITE_OTHER): Payer: Commercial Managed Care - HMO | Admitting: Physician Assistant

## 2015-05-18 ENCOUNTER — Ambulatory Visit: Payer: Commercial Managed Care - HMO | Admitting: Physician Assistant

## 2015-05-18 VITALS — BP 124/76 | HR 76 | Ht 69.0 in | Wt 206.0 lb

## 2015-05-18 DIAGNOSIS — I5043 Acute on chronic combined systolic (congestive) and diastolic (congestive) heart failure: Secondary | ICD-10-CM | POA: Diagnosis not present

## 2015-05-18 DIAGNOSIS — I10 Essential (primary) hypertension: Secondary | ICD-10-CM

## 2015-05-18 DIAGNOSIS — E785 Hyperlipidemia, unspecified: Secondary | ICD-10-CM

## 2015-05-18 DIAGNOSIS — Z79899 Other long term (current) drug therapy: Secondary | ICD-10-CM | POA: Diagnosis not present

## 2015-05-18 LAB — BASIC METABOLIC PANEL
BUN: 12 mg/dL (ref 7–25)
CHLORIDE: 103 mmol/L (ref 98–110)
CO2: 31 mmol/L (ref 20–31)
CREATININE: 1.28 mg/dL — AB (ref 0.70–1.25)
Calcium: 8.8 mg/dL (ref 8.6–10.3)
Glucose, Bld: 83 mg/dL (ref 65–99)
Potassium: 3.7 mmol/L (ref 3.5–5.3)
Sodium: 143 mmol/L (ref 135–146)

## 2015-05-18 NOTE — Patient Instructions (Signed)
Continue current dose of Torsemide.  Your physician recommends that you return for lab work in: today at [facility] 1st floor  Your physician recommends that you schedule a follow-up appointment in:  Keep your scheduled appointment on 06/16/14  Decrease your fluid in take every other day to one (1) liter.  Your physician recommends that you weigh, daily, at the same time every day, and in the same amount of clothing. Please record your daily weights on the handout provided and bring it to your next appointment.   Daily Weight Record It is important to weigh yourself daily. Keep this daily weight chart near your scale. Weigh yourself each morning at the same time. Weigh yourself without shoes, and wear the same amount of clothing each day. Compare today's weight to yesterday's weight. Bring this form with you to your follow-up appointments. Call your health care provider if you have concerns about your weight, including rapid weight gain or rapid weight loss. Date: ________ Weight: ____________________ Date: ________ Weight: ____________________ Date: ________ Weight: ____________________ Date: ________ Weight: ____________________ Date: ________ Weight: ____________________ Date: ________ Weight: ____________________ Date: ________ Weight: ____________________ Date: ________ Weight: ____________________ Date: ________ Weight: ____________________ Date: ________ Weight: ____________________ Date: ________ Weight: ____________________ Date: ________ Weight: ____________________ Date: ________ Weight: ____________________ Date: ________ Weight: ____________________ Date: ________ Weight: ____________________ Date: ________ Weight: ____________________ Date: ________ Weight: ____________________ Date: ________ Weight: ____________________ Date: ________ Weight: ____________________ Date: ________ Weight: ____________________ Date: ________ Weight: ____________________ Date: ________ Weight:  ____________________ Date: ________ Weight: ____________________ Date: ________ Weight: ____________________ Date: ________ Weight: ____________________ Date: ________ Weight: ____________________ Date: ________ Weight: ____________________ Date: ________ Weight: ____________________ Date: ________ Weight: ____________________ Date: ________ Weight: ____________________ Date: ________ Weight: ____________________ Date: ________ Weight: ____________________ Date: ________ Weight: ____________________ Date: ________ Weight: ____________________ Date: ________ Weight: ____________________ Date: ________ Weight: ____________________ Date: ________ Weight: ____________________ Date: ________ Weight: ____________________ Date: ________ Weight: ____________________ Date: ________ Weight: ____________________ Date: ________ Weight: ____________________ Date: ________ Weight: ____________________ Date: ________ Weight: ____________________ Date: ________ Weight: ____________________ Date: ________ Weight: ____________________ Date: ________ Weight: ____________________ Date: ________ Weight: ____________________ Date: ________ Weight: ____________________ Date: ________ Weight: ____________________ Date: ________ Weight: ____________________   This information is not intended to replace advice given to you by your health care provider. Make sure you discuss any questions you have with your health care provider.   Document Released: 07/28/2006 Document Revised: 06/06/2014 Document Reviewed: 12/13/2013 Elsevier Interactive Patient Education [employer].

## 2015-05-18 NOTE — Progress Notes (Signed)
Patient ID: Patrick [name], male   DOB: 1950/12/13, 64 y.o.   MRN: [id number]    Date:  05/18/2015   ID:  Patrick [name], DOB 04/16/1951, MRN [id number]  PCP:  Andrena Mews, MD  Primary Cardiologist:  [facility]   Chief Complaint  Patient presents with  . Follow-up     no chest pain, occassional shortness of breath, has edema, no pain or cramping in legs, no lightheaded or dizziness     History of Present Illness: Patrick [name] is a 64 y.o. male   With a history of congestive heart failure,hypertension, hyperlipidemia, large prostate, diabetes mellitus type 2 , degenerative joint disease right knee.   He was seen on December 2  For hospital follow-up.   Patient's torsemide was increased to 80 mg twice a day back on the 13th his weight had gone from 201.6-208.8. Patient is very diligent and brought in a calendar with his weight started his weight is decreased back to 103 pounds and has been stable last 3 days. He states he feels better does not wake up short of breath. He does continue to have some lower extremity edema.  The patient currently denies nausea, vomiting, fever, chest pain, shortness of breath, dizziness,  cough, congestion, abdominal pain, hematochezia, melena, claudication.  Wt Readings from Last 3 Encounters:  05/18/15 206 lb (93.441 kg)  05/12/15 206 lb 12.8 oz (93.804 kg)  05/05/15 203 lb 5 oz (92.222 kg)     Past Medical History  Diagnosis Date  . CHF (congestive heart failure) (HCC)     Preserved EF  . Hypertension   . Hyperlipidemia   . Enlarged prostate   . Pneumonia 05/2014  . DM II (diabetes mellitus, type II), controlled ([place])     Diet controlled.   . Degenerative joint disease of knee, right   . Arthritis     "right hand; right knee" (06/19/2014)    Current Outpatient Prescriptions  Medication Sig Dispense Refill  . Ascorbic Acid (VITAMIN C) 1000 MG tablet Take 2,000 mg by mouth 2 (two) times daily.    Marland Kitchen aspirin EC 81 MG EC tablet Take 1 tablet (81 mg total) by  mouth daily. 30 tablet 0  . carvedilol (COREG) 25 MG tablet Take 1 tablet (25 mg total) by mouth 2 (two) times daily. 180 tablet 3  . EQL SENNA LAXATIVE 8.6 MG tablet Take 8.6 mg by mouth 3 (three) times daily. Pt states he rotates between 2 and 3 tablets nightly    . ferrous sulfate 325 (65 FE) MG tablet Take 1 tablet (325 mg total) by mouth daily with breakfast. 30 tablet 3  . finasteride (PROSCAR) 5 MG tablet TAKE 1 TABLET BY MOUTH DAILY 90 tablet 1  . losartan (COZAAR) 50 MG tablet TAKE 1 TABLET BY MOUTH EVERY DAY 90 tablet 1  . lovastatin (MEVACOR) 20 MG tablet TAKE 1 TABLET BY MOUTH AT BEDTIME 90 tablet 1  . mirtazapine (REMERON SOL-TAB) 15 MG disintegrating tablet Take 1 tablet (15 mg total) by mouth at bedtime. 30 tablet 3  . Multiple Vitamins-Minerals (MULTIVITAMIN WITH MINERALS) tablet Take 1 tablet by mouth daily.    Marland Kitchen omega-3 acid ethyl esters (LOVAZA) 1 G capsule TAKE 2 CAPSULES BY MOUTH TWICE DAILY 360 capsule 1  . oxyCODONE-acetaminophen (PERCOCET/ROXICET) 5-325 MG tablet Take 1 tablet by mouth every 8 (eight) hours as needed for severe pain. 90 tablet 0  . potassium chloride SA (K-DUR,KLOR-CON) 20 MEQ tablet TAKE 2 TABLET BY MOUTH  EVERY DAY (Patient taking differently: TAKE 3 TABLET BY MOUTH EVERY DAY) 180 tablet 1  . tamsulosin (FLOMAX) 0.4 MG CAPS capsule Take 0.4 mg by mouth daily.     Marland Kitchen torsemide (DEMADEX) 20 MG tablet Take 80 mg ( 4 tablets ) twice a day 224 tablet 3  . traZODone (DESYREL) 100 MG tablet Take 1 tablet (100 mg total) by mouth 2 (two) times daily. 180 tablet 1  . traMADol (ULTRAM) 50 MG tablet Take 1 tablet by mouth every 8 (eight) hours. Take 1 tab every 8 hours as needed for pain  0   No current facility-administered medications for this visit.    Allergies:   No Known Allergies  Social History:  The patient  reports that he has quit smoking. His smoking use included Pipe and Cigars. He has never used smokeless tobacco. He reports that he does not drink  alcohol or use illicit drugs.   Family history:   Family History  Problem Relation Age of Onset  . Hypertension Mother   . Diabetes Mother   . Stroke Mother   . Cancer Father 6    Prostate  . Dementia Father   . COPD Sister   . Arthritis Sister   . Edema Sister     ROS:  Please see the history of present illness.  All other systems reviewed and negative.   PHYSICAL EXAM: VS:  BP 124/76 mmHg  Pulse 76  Ht 5\' 9"  (1.753 m)  Wt 206 lb (93.441 kg)  BMI 30.41 kg/m2 Well nourished, well developed, in no acute distress HEENT: Pupils are equal round react to light accommodation extraocular movements are intact.  Neck: no JVDNo cervical lymphadenopathy. Cardiac: Regular rate and rhythm without murmurs rubs or gallops.+S3. Lungs:  clear to auscultation bilaterally, no wheezing, rhonchi or rales Abd: soft, nontender, positive bowel sounds all quadrants, no hepatosplenomegaly Ext: 1-2+ lower extremity edema.  2+ radial and dorsalis pedis pulses. Skin: warm and dry Neuro:  Grossly normal     ASSESSMENT AND PLAN:  Problem List Items Addressed This Visit    Hyperlipidemia   Essential hypertension - Primary   Acute on chronic systolic and diastolic heart failure, NYHA class 2 ([place])    The patient  still appears to be volume overloaded.   His 1-2+ pitting lower from edema as well as an S3  and JVD.   He is feeling better however, and on his weight chart his weight is stabilized at 203 pounds.   For now will continue his current dose of torsemide check a basic metabolic panel. I've asked him to increase his fluid intake every other day to  32 ounces and continue the  48 ounce restriction of the other days.   He seems to be conscientious about watching his sodium intake.   He reports that the lowest he's weighed  was about 3 months ago at 197 pounds.   May need to consider adding as needed  Metolazone.   Also on Coreg 25 mg twice daily and Cozaar 50 mg daily.  Blood pressures well-controlled.  Takes Lovaza for his lipids.

## 2015-05-19 ENCOUNTER — Other Ambulatory Visit: Payer: Self-pay | Admitting: Family Medicine

## 2015-05-19 MED ORDER — LOVASTATIN 20 MG PO TABS: 20.0000 mg | ORAL_TABLET | Freq: Every day | ORAL | Status: DC

## 2015-05-19 MED ORDER — FINASTERIDE 5 MG PO TABS: 5.0000 mg | ORAL_TABLET | Freq: Every day | ORAL | Status: DC

## 2015-05-19 NOTE — Telephone Encounter (Signed)
Needs refills on: finasteride--90 tablets, lovastatin-90 tablets   [facility] on [address]

## 2015-06-03 ENCOUNTER — Telehealth: Payer: Self-pay | Admitting: Cardiology

## 2015-06-03 ENCOUNTER — Other Ambulatory Visit: Payer: Self-pay | Admitting: Family Medicine

## 2015-06-03 MED ORDER — POTASSIUM CHLORIDE CRYS ER 20 MEQ PO TBCR: EXTENDED_RELEASE_TABLET | ORAL | Status: DC

## 2015-06-03 NOTE — Telephone Encounter (Signed)
Pt called and would like some tramadol called in, he has some hydrocodone but doesn't want to take this since it is for very severe pain and he has moderate pain. jw

## 2015-06-03 NOTE — Telephone Encounter (Signed)
°*  STAT* If patient is at the pharmacy, call can be transferred to refill team.   1. Which medications need to be refilled? (please list name of each medication and dose if known) Potassium CL ER-He take 3 a day  2. Which pharmacy/location (including street and city if local pharmacy) is medication to be sent to?[phone]  3. Do they need a 30 day or 90 day supply? #270 and refills

## 2015-06-05 ENCOUNTER — Telehealth: Payer: Self-pay | Admitting: Cardiology

## 2015-06-05 NOTE — Telephone Encounter (Signed)
Pt weight have been up,today is 203.Prior to that it have been 200.2 for about a week. Both of his ankles and abdomen measurements are bigger today as well.Please call to advise.

## 2015-06-05 NOTE — Telephone Encounter (Signed)
Returned call to [employer]. Left message for [facility] requesting callback.  As noted below, pt weight up ~3 lbs today w/ increased ankle swelling.  When I receive call back, will inquire as to whether pt has been compliant w/ dosing recommendations and fluid restrictions as outlined at visit w/ Tarri Fuller.  Last BMP 2 weeks ago w/ Cr 1.28 Current dose of torsemide 80mg  BID Unsure if OK to increase torsemide further - will route to Dr. [place] ([facility]) for options.

## 2015-06-05 NOTE — Telephone Encounter (Signed)
He can take an extra dose of torsemide this evening then resume prior dose and observe weight and edema.  Jillayne Witte Martinique MD, [facility]

## 2015-06-05 NOTE — Telephone Encounter (Signed)
Donita called back w/ further information.  Weight change was over 24 hrs. 1cm ankle girth increase BID. Abdominal increase 2cm - 108 to 110 Pt did admit difficulty in adhering to recommended fluid restrictions.  States that pt noticed he wasn't urinating as much yesterday.  She asked that we call pt back directly w/ instructions.

## 2015-06-05 NOTE — Telephone Encounter (Signed)
Instructions relayed to patient. He asked if OK to elect to do extra dose tomorrow AM instead of tonight d/t concern for incr urinary urgency at night, I advised this was fine, and to call if further concerns. Pt voiced thanks and understanding of instructions.

## 2015-06-15 ENCOUNTER — Ambulatory Visit (HOSPITAL_COMMUNITY): Payer: Commercial Managed Care - HMO | Attending: Cardiovascular Disease

## 2015-06-15 ENCOUNTER — Other Ambulatory Visit: Payer: Self-pay

## 2015-06-15 DIAGNOSIS — I517 Cardiomegaly: Secondary | ICD-10-CM | POA: Insufficient documentation

## 2015-06-15 DIAGNOSIS — Z87891 Personal history of nicotine dependence: Secondary | ICD-10-CM | POA: Diagnosis not present

## 2015-06-15 DIAGNOSIS — Z6830 Body mass index (BMI) 30.0-30.9, adult: Secondary | ICD-10-CM | POA: Diagnosis not present

## 2015-06-15 DIAGNOSIS — R7303 Prediabetes: Secondary | ICD-10-CM | POA: Diagnosis not present

## 2015-06-15 DIAGNOSIS — E785 Hyperlipidemia, unspecified: Secondary | ICD-10-CM | POA: Insufficient documentation

## 2015-06-15 DIAGNOSIS — I429 Cardiomyopathy, unspecified: Secondary | ICD-10-CM | POA: Diagnosis not present

## 2015-06-15 DIAGNOSIS — E669 Obesity, unspecified: Secondary | ICD-10-CM | POA: Insufficient documentation

## 2015-06-15 DIAGNOSIS — I340 Nonrheumatic mitral (valve) insufficiency: Secondary | ICD-10-CM | POA: Insufficient documentation

## 2015-06-15 DIAGNOSIS — I10 Essential (primary) hypertension: Secondary | ICD-10-CM | POA: Insufficient documentation

## 2015-06-17 ENCOUNTER — Other Ambulatory Visit: Payer: Self-pay | Admitting: *Deleted

## 2015-06-17 ENCOUNTER — Encounter: Payer: Self-pay | Admitting: Physician Assistant

## 2015-06-17 ENCOUNTER — Ambulatory Visit (INDEPENDENT_AMBULATORY_CARE_PROVIDER_SITE_OTHER): Payer: Commercial Managed Care - HMO | Admitting: Physician Assistant

## 2015-06-17 VITALS — BP 100/92 | HR 60 | Ht 69.0 in | Wt 198.0 lb

## 2015-06-17 DIAGNOSIS — I420 Dilated cardiomyopathy: Secondary | ICD-10-CM | POA: Diagnosis not present

## 2015-06-17 DIAGNOSIS — I5022 Chronic systolic (congestive) heart failure: Secondary | ICD-10-CM

## 2015-06-17 DIAGNOSIS — I10 Essential (primary) hypertension: Secondary | ICD-10-CM | POA: Diagnosis not present

## 2015-06-17 DIAGNOSIS — Z79899 Other long term (current) drug therapy: Secondary | ICD-10-CM

## 2015-06-17 DIAGNOSIS — I255 Ischemic cardiomyopathy: Secondary | ICD-10-CM | POA: Insufficient documentation

## 2015-06-17 DIAGNOSIS — I5043 Acute on chronic combined systolic (congestive) and diastolic (congestive) heart failure: Secondary | ICD-10-CM | POA: Insufficient documentation

## 2015-06-17 DIAGNOSIS — E785 Hyperlipidemia, unspecified: Secondary | ICD-10-CM

## 2015-06-17 LAB — BASIC METABOLIC PANEL
BUN: 13 mg/dL (ref 7–25)
CALCIUM: 9.3 mg/dL (ref 8.6–10.3)
CO2: 34 mmol/L — AB (ref 20–31)
CREATININE: 1.22 mg/dL (ref 0.70–1.25)
Chloride: 103 mmol/L (ref 98–110)
Glucose, Bld: 87 mg/dL (ref 65–99)
Potassium: 3.6 mmol/L (ref 3.5–5.3)
SODIUM: 145 mmol/L (ref 135–146)

## 2015-06-17 NOTE — Progress Notes (Signed)
Patient ID: Patrick [name], male   DOB: [birth year], 65 y.o.   MRN: [id number]    Date:  06/17/2015   ID:  Patrick [name], DOB [birth year], MRN [id number]  PCP:  Andrena Mews, MD  Primary Cardiologist:  [facility]  Chief Complaint  Patient presents with  . Follow-up    no chest pain, some shortness of breath with exertion, a little swelling, no cramping, no dizziness or lightheadedness     History of Present Illness: Patrick [name] is a 65 y.o. male  With a history of congestive heart failure,hypertension, hyperlipidemia, large prostate, diabetes mellitus type 2 , degenerative joint disease right knee. He was seen on December 2 For hospital follow-up. Patient's torsemide was increased to 80 mg twice a day back on the 13th his weight had gone from 201.6-208.8.   I saw the patient back on 05/18/2015 he still appeared volume overloaded at that time with 1-2+ pitting edema and JVD.  His echocardiogram 11/17/2014 revealed an EF of 45-50% wall motion was normal with grade 2 diastolic dysfunction. Peak PA pressure was 53 mmHg. Mild mitral valve regurgitation left atrium was moderately dilated and was moderate tricuspid regurgitation.  He knew echocardiogram 06/15/2015 as yet ejection fraction is now 25-30% with mild MR left atrium was moderate to severely dilated the peak PA pressure of 67 mmHg. Was mild tricuspid regurgitation  Patient is here to follow-up of his echo. His echocardiogram shows a dramatic reduction in his ejection fraction since June of last year.  He had a stress test in March of last year which was high risk with a severe intensity fixed inferior defect suggestive of scar. There was also a small moderate reversible perfusion defect in the mid to distal anterior wall.  At that time it was recommended he have a heart catheterization.   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  On January 6 the patient had called because he had gained 3 pounds in one  night. He was told to take 160 mg torsemide  1 and then resume prior dosing. His only diuresed done 292.8 pounds which is his weight today.   He says he has very little dyspnea with exertion but otherwise denies orthopnea, PND, chest pain nausea, vomiting, fever, dizziness, cough, congestion, abdominal pain, hematochezia, melena, lower extremity edema, claudication.    Wt Readings from Last 3 Encounters:  06/17/15 198 lb (89.812 kg)  05/18/15 206 lb (93.441 kg)  05/12/15 206 lb 12.8 oz (93.804 kg)     Past Medical History  Diagnosis Date  . CHF (congestive heart failure) (HCC)     Preserved EF  . Hypertension   . Hyperlipidemia   . Enlarged prostate   . Pneumonia 05/2014  . DM II (diabetes mellitus, type II), controlled ([place])     Diet controlled.   . Degenerative joint disease of knee, right   . Arthritis     "right hand; right knee" (06/19/2014)    Current Outpatient Prescriptions  Medication Sig Dispense Refill  . Ascorbic Acid (VITAMIN C) 1000 MG tablet Take 2,000 mg by mouth 2 (two) times daily.    Marland Kitchen aspirin EC 81 MG EC tablet Take 1 tablet (81 mg total) by mouth daily. 30 tablet 0  . carvedilol (COREG) 25 MG tablet Take 1 tablet (25 mg total) by mouth 2 (two) times daily. 180 tablet 3  . EQL SENNA LAXATIVE 8.6 MG tablet Take 8.6 mg by  mouth 3 (three) times daily. Pt states he rotates between 2 and 3 tablets nightly    . ferrous sulfate 325 (65 FE) MG tablet Take 1 tablet (325 mg total) by mouth daily with breakfast. 30 tablet 3  . finasteride (PROSCAR) 5 MG tablet Take 1 tablet (5 mg total) by mouth daily. 90 tablet 1  . losartan (COZAAR) 50 MG tablet TAKE 1 TABLET BY MOUTH EVERY DAY 90 tablet 1  . lovastatin (MEVACOR) 20 MG tablet Take 1 tablet (20 mg total) by mouth at bedtime. 90 tablet 1  . mirtazapine (REMERON SOL-TAB) 15 MG disintegrating tablet Take 1 tablet (15 mg total) by mouth at bedtime. 30 tablet 3  . Multiple Vitamins-Minerals (MULTIVITAMIN WITH MINERALS) tablet  Take 1 tablet by mouth daily.    Marland Kitchen omega-3 acid ethyl esters (LOVAZA) 1 G capsule TAKE 2 CAPSULES BY MOUTH TWICE DAILY 360 capsule 1  . oxyCODONE-acetaminophen (PERCOCET/ROXICET) 5-325 MG tablet Take 1 tablet by mouth every 8 (eight) hours as needed for severe pain. 90 tablet 0  . potassium chloride SA (K-DUR,KLOR-CON) 20 MEQ tablet TAKE 3 TABLET BY MOUTH EVERY DAY 270 tablet 3  . tamsulosin (FLOMAX) 0.4 MG CAPS capsule Take 0.4 mg by mouth daily.     Marland Kitchen torsemide (DEMADEX) 20 MG tablet Take 80 mg ( 4 tablets ) twice a day 224 tablet 3  . traZODone (DESYREL) 100 MG tablet Take 1 tablet (100 mg total) by mouth 2 (two) times daily. 180 tablet 1   No current facility-administered medications for this visit.    Allergies:   No Known Allergies  Social History:  The patient  reports that he has quit smoking. His smoking use included Pipe and Cigars. He has never used smokeless tobacco. He reports that he does not drink alcohol or use illicit drugs.   Family history:   Family History  Problem Relation Age of Onset  . Hypertension Mother   . Diabetes Mother   . Stroke Mother   . Cancer Father 46    Prostate  . Dementia Father   . COPD Sister   . Arthritis Sister   . Edema Sister     ROS:  Please see the history of present illness.  All other systems reviewed and negative.   PHYSICAL EXAM: VS:  BP 100/92 mmHg  Pulse 60  Ht 5\' 9"  (1.753 m)  Wt 198 lb (89.812 kg)  BMI 29.23 kg/m2 Well nourished, well developed, in no acute distress HEENT: Pupils are equal round react to light accommodation extraocular movements are intact.  Neck: no JVDNo cervical lymphadenopathy. Cardiac: Regular rate and rhythm +S3 without murmurs rubs or gallops. Lungs:  clear to auscultation bilaterally, no wheezing, rhonchi or rales Abd: soft, nontender, positive bowel sounds all quadrants, no hepatosplenomegaly Ext: Trace lower extremity edema.  2+ radial  pulses. Skin: warm and dry Neuro:  Grossly  normal    ASSESSMENT AND PLAN:  Problem List Items Addressed This Visit    Hyperlipidemia   Essential hypertension   Congestive dilated cardiomyopathy (HCC)   Chronic systolic heart failure ([place])    Other Visit Diagnoses    Polypharmacy    -  Primary    Relevant Orders    Basic metabolic panel       patient has had a dramatic reduction in his ejection fraction since last June. He denies ever having any chest pain. Since he had a stress test which was high risk in March of last year and how  they recommended and urged him to undergo cardiac catheterization.  He is concerned is concerned about who might take care of his sister is gone for a couple days. I told him I would call him on Friday to see if he's been able to make arrangements.   The procedure has been discussed.  The patient understands that risks include but are not limited to stroke (1 in 1000), death (1 in 46), kidney failure [usually temporary] (1 in 500), bleeding (1 in 200), allergic reaction [possibly serious] (1 in 200).    had 1 high dose of torsemide did a great job diuresing him. He brought in a very detailed chart of his daily weights he went from 203 pounds 292 pounds in the last 2 weeks.    Legs only have a trace of edema last saw him in December he had 2-3+ pitting edema.   Check a basic metabolic panel today.   He is on permanent medications including aspirin, Coreg 25 twice a day , Cozaar 50 mg daily , K Dur. We'll continue his 80 mg of torsemide twice daily.   Have requested follow-up for 2 weeks to make sure he doesn't fall through the cracks.

## 2015-06-17 NOTE — Patient Instructions (Signed)
Your physician recommends that you return for lab work [place] will contact you on Friday in reference to your heart catherization.  Your physician recommends that you schedule a follow-up appointment in: 2 weeks with Tarri Fuller.

## 2015-06-17 NOTE — Patient Outreach (Signed)
Call placed to member to follow up on current health status.  Member states that he has been "good" and denies any concerns at this time.  Member made aware that this care manager now has a scale available for him (he confirms that he is still in need of a new one).  Scheduled for a home visit to deliver equipment.  He states that he has another follow up appointment with his cardiologist today, but does not have any concerns.  After scale is delivered, will transfer to health coach.  [name], BSN, [place] [facility]  [facility] Care Manager [phone]

## 2015-06-22 ENCOUNTER — Telehealth: Payer: Self-pay | Admitting: Physician Assistant

## 2015-06-22 NOTE — Telephone Encounter (Signed)
I spoke to Patrick [name] and he agrees to proceed with a left heart catheterization.  He is trying to arrange care for his sister while the procedure is taking place.  Tarri Fuller PAC

## 2015-06-23 ENCOUNTER — Ambulatory Visit: Payer: Commercial Managed Care - HMO | Admitting: Family Medicine

## 2015-06-23 ENCOUNTER — Other Ambulatory Visit: Payer: Self-pay | Admitting: *Deleted

## 2015-06-23 ENCOUNTER — Telehealth: Payer: Self-pay | Admitting: *Deleted

## 2015-06-23 DIAGNOSIS — I5041 Acute combined systolic (congestive) and diastolic (congestive) heart failure: Secondary | ICD-10-CM

## 2015-06-23 DIAGNOSIS — Z01810 Encounter for preprocedural cardiovascular examination: Secondary | ICD-10-CM

## 2015-06-23 DIAGNOSIS — I5020 Unspecified systolic (congestive) heart failure: Secondary | ICD-10-CM

## 2015-06-23 NOTE — Patient Outreach (Signed)
[facility]) Care Management   06/23/2015  Patrick [name] December 25, 1950 [id number]  Patrick [name] is an 65 y.o. male  Subjective:   Member states that he is "good."  He denies any shortness of breath or chest pain.  He reports that he has been taking all of his medications as prescribed.  He states that his weight has fluctuated "a pound or two" this week but denies the need to contact the physician at this time.  Instructed to continue to monitor.  Objective:   Review of Systems  Constitutional: Negative.   HENT: Negative.   Eyes: Negative.   Respiratory: Negative.   Cardiovascular: Negative.   Gastrointestinal: Negative.   Genitourinary: Negative.   Musculoskeletal: Negative.   Skin: Negative.   Neurological: Negative.   Endo/Heme/Allergies: Negative.   Psychiatric/Behavioral: Negative.     Physical Exam  Current Medications:   Current Outpatient Prescriptions  Medication Sig Dispense Refill  . Ascorbic Acid (VITAMIN C) 1000 MG tablet Take 2,000 mg by mouth 2 (two) times daily.    Marland Kitchen aspirin EC 81 MG EC tablet Take 1 tablet (81 mg total) by mouth daily. 30 tablet 0  . carvedilol (COREG) 25 MG tablet Take 1 tablet (25 mg total) by mouth 2 (two) times daily. 180 tablet 3  . EQL SENNA LAXATIVE 8.6 MG tablet Take 8.6 mg by mouth 3 (three) times daily. Pt states he rotates between 2 and 3 tablets nightly    . ferrous sulfate 325 (65 FE) MG tablet Take 1 tablet (325 mg total) by mouth daily with breakfast. 30 tablet 3  . finasteride (PROSCAR) 5 MG tablet Take 1 tablet (5 mg total) by mouth daily. 90 tablet 1  . losartan (COZAAR) 50 MG tablet TAKE 1 TABLET BY MOUTH EVERY DAY 90 tablet 1  . lovastatin (MEVACOR) 20 MG tablet Take 1 tablet (20 mg total) by mouth at bedtime. 90 tablet 1  . mirtazapine (REMERON SOL-TAB) 15 MG disintegrating tablet Take 1 tablet (15 mg total) by mouth at bedtime. 30 tablet 3  . Multiple Vitamins-Minerals (MULTIVITAMIN WITH MINERALS) tablet Take 1  tablet by mouth daily.    Marland Kitchen omega-3 acid ethyl esters (LOVAZA) 1 G capsule TAKE 2 CAPSULES BY MOUTH TWICE DAILY 360 capsule 1  . oxyCODONE-acetaminophen (PERCOCET/ROXICET) 5-325 MG tablet Take 1 tablet by mouth every 8 (eight) hours as needed for severe pain. 90 tablet 0  . potassium chloride SA (K-DUR,KLOR-CON) 20 MEQ tablet TAKE 3 TABLET BY MOUTH EVERY DAY 270 tablet 3  . tamsulosin (FLOMAX) 0.4 MG CAPS capsule Take 0.4 mg by mouth daily.     Marland Kitchen torsemide (DEMADEX) 20 MG tablet Take 80 mg ( 4 tablets ) twice a day 224 tablet 3  . traZODone (DESYREL) 100 MG tablet Take 1 tablet (100 mg total) by mouth 2 (two) times daily. 180 tablet 1   No current facility-administered medications for this visit.    Functional Status:   In your present state of health, do you have any difficulty performing the following activities: 05/12/2015 04/03/2015  Hearing? N N  Vision? Y N  Difficulty concentrating or making decisions? Y N  Walking or climbing stairs? N N  Dressing or bathing? N N  Doing errands, shopping? N N  Preparing Food and eating ? N -  Using the Toilet? N -  In the past six months, have you accidently leaked urine? Y -  Do you have problems with loss of bowel control? N -  Managing your Medications? N -  Managing your Finances? N -  Housekeeping or managing your Housekeeping? N -    Fall/Depression Screening:    PHQ 2/9 Scores 05/05/2015 05/05/2015 04/17/2015 03/30/2015 10/03/2014 08/29/2014 07/01/2014  PHQ - 2 Score 2 1 4 6  0 2 2  PHQ- 9 Score 5 - 10 - - 7 8    Assessment:    Member provided with with scale, educated on correct use, return demonstration noted.  Member instructed to continue to weigh daily and record readings, reporting excessive weight gain to his physician.  He verbalize understanding.  He also verbalizes understanding on low sodium diet, provided member with education on choosing foods with low sodium, and explanations on how to read food labels.    Member inquires  about how to get grab bars for his bathroom, informed that this care manager will have care management assistant to send information in the mail regarding programs and companies that provide assistance with supplies.  He denies any further questions or concerns.  Offered to have member involved with health coach, but he states that he feels he has a good understanding on how to care for heart failure and when to contact the physician to report concerns.  He is made aware that after he is provided with resources for grab bars, his case will be closed.     Plan:   Will request care management assistant to send member information for community resources to help get grab bars in the shower. Will contact member in 2 weeks to follow up on status of grab bars, if no further needs, will close case at that time.  [name], BSN, [place] Management  [facility] Care Manager [phone]

## 2015-06-23 NOTE — Telephone Encounter (Signed)
Orders for labwork and cath placed at Dr. Rosezella [place] recommendation. Message sent to scheduling for arrangement of proc w/ patient.

## 2015-06-26 ENCOUNTER — Telehealth: Payer: Self-pay

## 2015-06-26 NOTE — Telephone Encounter (Signed)
Signed home health orders faxed to [facility] at [facility].

## 2015-06-30 ENCOUNTER — Ambulatory Visit: Payer: Commercial Managed Care - HMO | Admitting: Family Medicine

## 2015-07-01 ENCOUNTER — Ambulatory Visit: Payer: Commercial Managed Care - HMO | Admitting: Physician Assistant

## 2015-07-03 ENCOUNTER — Encounter: Payer: Self-pay | Admitting: *Deleted

## 2015-07-07 NOTE — Addendum Note (Signed)
Addended byValente David on: 07/07/2015 06:26 PM   Modules accepted: Orders

## 2015-07-08 ENCOUNTER — Other Ambulatory Visit: Payer: Self-pay | Admitting: *Deleted

## 2015-07-09 ENCOUNTER — Encounter: Payer: Self-pay | Admitting: *Deleted

## 2015-07-13 ENCOUNTER — Other Ambulatory Visit: Payer: Self-pay | Admitting: Family Medicine

## 2015-07-13 ENCOUNTER — Telehealth: Payer: Self-pay | Admitting: *Deleted

## 2015-07-13 DIAGNOSIS — B351 Tinea unguium: Secondary | ICD-10-CM

## 2015-07-13 NOTE — Telephone Encounter (Signed)
Referral order placed.

## 2015-07-13 NOTE — Telephone Encounter (Signed)
Will forward to MD to place referral. Jazmin Hartsell,CMA  

## 2015-07-13 NOTE — Telephone Encounter (Signed)
Pt calls and needs a new referral to Dr. Little Ishikawa ([facility]) so his insurance will pay.  Has appt on 07/28/15. Patrick [name], [name]

## 2015-07-13 NOTE — Progress Notes (Signed)
Pt calls and needs a new referral to Dr. Little Ishikawa ([facility]) so his insurance will pay. Has appt on 07/28/15. [name], [name]

## 2015-07-14 ENCOUNTER — Encounter: Payer: Self-pay | Admitting: *Deleted

## 2015-07-14 ENCOUNTER — Other Ambulatory Visit: Payer: Self-pay | Admitting: *Deleted

## 2015-07-14 NOTE — Patient Outreach (Signed)
[facility]) Care Management  [facility] Social Work  07/14/2015  Patrick [name] [birth year] [id number]  Subjective:    "I was told I need some grab bars in my bathroom but I can't afford to buy anything".  Objective:   CSW agreed to assist patient with obtaining financial assistance to purchase durable medical equipment for home use to try and prevent falls/injuries.  Current Medications:  Current Outpatient Prescriptions  Medication Sig Dispense Refill  . Ascorbic Acid (VITAMIN C) 1000 MG tablet Take 2,000 mg by mouth 2 (two) times daily.    Marland Kitchen aspirin EC 81 MG EC tablet Take 1 tablet (81 mg total) by mouth daily. 30 tablet 0  . carvedilol (COREG) 25 MG tablet Take 1 tablet (25 mg total) by mouth 2 (two) times daily. 180 tablet 3  . EQL SENNA LAXATIVE 8.6 MG tablet Take 8.6 mg by mouth 3 (three) times daily. Pt states he rotates between 2 and 3 tablets nightly    . ferrous sulfate 325 (65 FE) MG tablet Take 1 tablet (325 mg total) by mouth daily with breakfast. 30 tablet 3  . finasteride (PROSCAR) 5 MG tablet Take 1 tablet (5 mg total) by mouth daily. 90 tablet 1  . losartan (COZAAR) 50 MG tablet TAKE 1 TABLET BY MOUTH EVERY DAY 90 tablet 1  . lovastatin (MEVACOR) 20 MG tablet Take 1 tablet (20 mg total) by mouth at bedtime. 90 tablet 1  . mirtazapine (REMERON SOL-TAB) 15 MG disintegrating tablet Take 1 tablet (15 mg total) by mouth at bedtime. 30 tablet 3  . Multiple Vitamins-Minerals (MULTIVITAMIN WITH MINERALS) tablet Take 1 tablet by mouth daily.    Marland Kitchen omega-3 acid ethyl esters (LOVAZA) 1 G capsule TAKE 2 CAPSULES BY MOUTH TWICE DAILY 360 capsule 1  . oxyCODONE-acetaminophen (PERCOCET/ROXICET) 5-325 MG tablet Take 1 tablet by mouth every 8 (eight) hours as needed for severe pain. 90 tablet 0  . potassium chloride SA (K-DUR,KLOR-CON) 20 MEQ tablet TAKE 3 TABLET BY MOUTH EVERY DAY 270 tablet 3  . tamsulosin (FLOMAX) 0.4 MG CAPS capsule Take 0.4 mg by mouth daily.     Marland Kitchen  torsemide (DEMADEX) 20 MG tablet Take 80 mg ( 4 tablets ) twice a day 224 tablet 3  . traZODone (DESYREL) 100 MG tablet Take 1 tablet (100 mg total) by mouth 2 (two) times daily. 180 tablet 1   No current facility-administered medications for this visit.    Functional Status:  In your present state of health, do you have any difficulty performing the following activities: 07/14/2015 05/12/2015  Hearing? N N  Vision? N Y  Difficulty concentrating or making decisions? N Y  Walking or climbing stairs? N N  Dressing or bathing? N N  Doing errands, shopping? N N  Preparing Food and eating ? N N  Using the Toilet? N N  In the past six months, have you accidently leaked urine? N Y  Do you have problems with loss of bowel control? N N  Managing your Medications? N N  Managing your Finances? Y N  Housekeeping or managing your Housekeeping? N N    Fall/Depression Screening:  PHQ 2/9 Scores 07/14/2015 05/05/2015 05/05/2015 04/17/2015 03/30/2015 10/03/2014 08/29/2014  PHQ - 2 Score 0 2 1 4 6  0 2  PHQ- 9 Score - 5 - 10 - - 7    Assessment:   CSW received a new referral on patient from patient's RNCM with [facility], [employer] indicating  that patient would benefit from social work services and resources to assist with obtaining safety durable medical equipment for home use.  More specifically, Mrs. Patrick [name] reported that patient is in need of a hand held shower device, a hand rail to assist with stepping into and exiting out of the bathtub and grab bars in the shower. Patient will also need to have this equipment installed in his bathroom. CSW was able to make initial contact with patient today to perform phone assessment, as well as assess and assist with social work needs and services.  CSW introduced self, explained role and types of services provided through [place] [facility] ([place] [facility]).  CSW further explained to patient that CSW works with  patient's RNCM, also with [place] [facility], Patrick [name]. CSW then explained the reason for the call, indicating that Mrs. Patrick [name] thought that patient would benefit from social work services and resources to assist with financial assistance to obtain needed safety devices to be installed in the home.  CSW obtained two HIPAA compliant identifiers from patient, which included patient's name and date of birth. Patient admitted that he is on a very fixed income, he and his sister, Patrick [name], with whom he currently lives, and unable to afford any added expenses each month.  CSW is aware that patient's and Ms. [name]'s income levels are below the [place], as both are current Adult Medicaid recipients with the [place].  Patient is agreeable to having safety equipment installed in his bathroom, reporting that his friend/neighbor, who is also a Cytogeneticist, has already agreed to perform all the installations, free of charge, but patient is unable to come up with the money to purchase the equipment. CSW explained to patient that CSW has already discussed patient's case with CSW's Surveyor, quantity, Patrick [name], also with [place] [facility], and that Patrick [name] has approved for patient to receive financial assistance, not only to receive the durable medical equipment, but also to have it installed, at the expense of [place] [facility].  CSW went on to explain that [facility] has a Soil scientist with [employer], which is a licensed/bonded agency that will bill [place] directly for [employer] and labor provided. Patient was extremely pleased to hear that he would be receiving home safety equipment, at no expense.  Patient denied having any additional social work needs at present.  CSW agreed to follow-up with patient as soon as CSW is able to make contact with  [employer] and schedule a home assessment to ensure patient's availability.  Patient voiced understanding and was agreeable to this plan.  Plan:  CSW will converse with patient's RNCM with [place] [facility], Patrick [name] to report findings of initial phone conversation with patient today. CSW will prescribe and print EMMI information to review with patient at the initial home visit. CSW will fax a correspondence letter to patient's Primary Care Physician, Dr. Andrena Mews to ensure that Dr. Gwendlyn Deutscher is aware of CSW's involvement with patient's care.   CSW will contact [employer] to make a referral for services for patient.  [name], BSW, MSW, LCSW  Licensed Education officer, environmental Health System  Mailing [place] [address] [place], [place] [address] Physical Address-[address], [place], [place] [address] Toll Free Main # [phone] Fax # [phone] Cell # [phone]  Fax #  [email or web address]    [facility] complies with [employer] civil rights laws and does not discriminate on the basis of race, color, national origin, age, disability, or sex.  Espaol (Spanish)  [facility] cumple con las leyes federales de derechos civiles aplicables y no discrimina por motivos de raza, color, nacionalidad, edad, discapacidad o sexo.     Ti?ng Vi?t ([place])  [place] tun th? lu?t dn quy?n hi?n hnh c?a Lin bang v khng phn bi?t ?i x? d?a trn ch?ng t?c, mu da, ngu?n g?c qu?c gia, ? tu?i, khuy?t t?t, ho?c gi?i tnh.     (Arabic)    [place] is Against the [employer]. and its subsidiaries comply with [employer] civil rights laws and do not discriminate on the basis of race, color, national origin, age, disability, or sex. [place] do not exclude people or treat them differently because of race, color, national origin, age, disability, or sex.    Yahoo. and its subsidiaries provide:  . Free auxiliary aids and services, such as qualified sign language interpreters, video remote interpretation, and written information in other formats to people with disabilities when such auxiliary aids and services are necessary to ensure an equal opportunity to participate. . Free language services to people whose primary language is not English when those services are necessary to provide meaningful access, such as translated documents or oral interpretation.    If you need these services, call [phone] or if you use a TTY, call 711.   If you believe that Yahoo. and its subsidiaries have failed to provide these services or discriminated in another way on the basis of race, color, national origin, age, disability, or sex, you can file a Tourist information centre manager with:   Discrimination [place] [address]   If you need help filing a grievance, call [phone] or if you use a TTY, call 711.  You can also file a civil rights complaint with the U.S. Department of Health and Financial controller, Office for Civil Rights electronically through the Office for Civil Rights Complaint Portal, available at OnSiteLending.nl.jsf, or by mail or phone at:   [place]. Department of Health and Human Services  [place], [place], [facility] Building  [place], [place]  [phone], [phone] (TDD)  Complaint forms are available at CutFunds.si [place]: ATTENTION: If you do not speak English, language assistance services, free of charge, are available to  you. Call [phone] (TTY: [id number]).  Espaol (Spanish): ATENCIN: si habla espaol, tiene a su disposicin servicios gratuitos de asistencia lingstica. Llame al [phone] (TTY: R9478181). ???? (Chinese): ?????????????????????????????? [phone]?TTY: 711??  Ti?ng Vi?t (Vietnamese): CH : N?u b?n ni Ti?ng Vi?t, c cc d?ch v? h? tr? ngn ng? mi?n ph dnh cho b?n. G?i s? [phone] (TTY: R9478181).  ??? ([place]): ?? : ???? ????? ?? , ?? ?? ???? ??? ???? ? ???? . [phone] (TTY: 711)??? ??? ???? .  Tagalog (Tagalog - Filipino): PAUNAWA: Kung nagsasalita ka ng Tagalog, maaari kang gumamit ng mga serbisyo ng tulong sa wika nang walang bayad. [place] sa [phone] (TTY: R9478181).   [place]union): :      ,      .  [phone] (: [id number]).  Ethelene Hal [place]  Creole): ATANSYON: Si w pale Ethelene Hal, gen svis d pou lang ki disponib gratis pou ou. Rele [phone] (TTY: R9478181).  Fonnie Jarvis Marland KitchenPakistan): ATTENTION : Si vous parlez franais, des services d'aide linguistique vous sont proposs gratuitement. Appelez le [phone] (ATS : [id number]).  Polski (Polish): UWAGA: Jeeli mwisz po polsku, moesz skorzysta z bezpatnej pomocy jzykowej. Zadzwo pod numer [phone] (TTY: R9478181).  Portugus ([place]): ATENO: Se fala portugus, encontram-se disponveis servios lingusticos, grtis. Ligue para [phone] (TTY: R9478181).  Italiano ([place]): ATTENZIONE: In caso la lingua parlata sia l'italiano, sono disponibili servizi di assistenza linguistica gratuiti. Chiamare il numero [phone] (TTY: [id number]).  Dawayne Patricia ([place]): ACHTUNG: Wenn Sie Deutsch sprechen,  stehen Ihnen kostenlos sprachliche Hilfsdienstleistungen zur [employer]. Rufnummer: [phone] (TTY: R9478181).   (Arabic): [phone]   .             : .)[id number] :   (  ??? ([place]): ??????????????????????????????????[phone] ?TTY?711?????????????????  ? (Farsi): [phone]  . ?   ? ?  ? ? ?~ ?  ?    : .??  (TTY: 711)  Din Bizaad ([place]): D77 baa ak0 n7n7zin: D77 saad bee y1n7[ti'go Risa Grill, saad bee 1k1'1n7da'1wo'd66', t'11 Pricilla Loveless n1 h0l=, koj8' h0d77lnih [phone] (TTY:   [id number]).

## 2015-07-17 ENCOUNTER — Ambulatory Visit (INDEPENDENT_AMBULATORY_CARE_PROVIDER_SITE_OTHER): Payer: Commercial Managed Care - HMO | Admitting: Family Medicine

## 2015-07-17 ENCOUNTER — Encounter: Payer: Self-pay | Admitting: Family Medicine

## 2015-07-17 VITALS — BP 136/75 | HR 62 | Temp 97.6°F | Ht 69.0 in | Wt 201.0 lb

## 2015-07-17 DIAGNOSIS — F331 Major depressive disorder, recurrent, moderate: Secondary | ICD-10-CM

## 2015-07-17 DIAGNOSIS — D649 Anemia, unspecified: Secondary | ICD-10-CM | POA: Diagnosis not present

## 2015-07-17 DIAGNOSIS — I10 Essential (primary) hypertension: Secondary | ICD-10-CM | POA: Diagnosis not present

## 2015-07-17 DIAGNOSIS — D508 Other iron deficiency anemias: Secondary | ICD-10-CM

## 2015-07-17 DIAGNOSIS — I5022 Chronic systolic (congestive) heart failure: Secondary | ICD-10-CM

## 2015-07-17 DIAGNOSIS — R7303 Prediabetes: Secondary | ICD-10-CM | POA: Diagnosis not present

## 2015-07-17 LAB — POCT GLYCOSYLATED HEMOGLOBIN (HGB A1C): Hemoglobin A1C: 4.9

## 2015-07-17 LAB — POCT HEMOGLOBIN: HEMOGLOBIN: 10.9 g/dL — AB (ref 14.1–18.1)

## 2015-07-17 NOTE — Assessment & Plan Note (Addendum)
BP looks good on current regimen. No dose adjustment needed,

## 2015-07-17 NOTE — Patient Instructions (Signed)
It was nice seeing today. I am glad you are doing well in general. Your A1C is 4.9 today. Continue current medications for your CHF and HTN. We will check you for anemia level today. Please call if you have any question.

## 2015-07-17 NOTE — Progress Notes (Signed)
Subjective:     Patient ID: Patrick [name], male   DOB: [birth year], 65 y.o.   MRN: [id number]  HPI HTN/CHF: patient is here for follow up. He denies any concern, no SOB,his leg swelling has improved a lot. He is currently on Torsemide 80 mg BID,Coreg 25 mg BID. He stated his weight check at home this morning was 197lbs while he was only wearing his pyjamas. He was recently evaluated by his cardiologist who continued to recommend cardiac cath but he stated he is still not ready to get it done. Depression: He is compliant with his Trazodone 100 mg BID, he denies any suicidal or homicidal ideation. He feels he is doing well on his current regimen. Anemia:He is compliant with is ferrous sulfate, he will like to check his blood level. Prediabetes: here for follow up. Only on diet control and mild exercise.  Current Outpatient Prescriptions on File Prior to Visit  Medication Sig Dispense Refill  . Ascorbic Acid (VITAMIN C) 1000 MG tablet Take 2,000 mg by mouth 2 (two) times daily.    Marland Kitchen aspirin EC 81 MG EC tablet Take 1 tablet (81 mg total) by mouth daily. 30 tablet 0  . carvedilol (COREG) 25 MG tablet Take 1 tablet (25 mg total) by mouth 2 (two) times daily. 180 tablet 3  . EQL SENNA LAXATIVE 8.6 MG tablet Take 8.6 mg by mouth 3 (three) times daily. Pt states he rotates between 2 and 3 tablets nightly    . ferrous sulfate 325 (65 FE) MG tablet Take 1 tablet (325 mg total) by mouth daily with breakfast. 30 tablet 3  . finasteride (PROSCAR) 5 MG tablet Take 1 tablet (5 mg total) by mouth daily. 90 tablet 1  . losartan (COZAAR) 50 MG tablet TAKE 1 TABLET BY MOUTH EVERY DAY 90 tablet 1  . lovastatin (MEVACOR) 20 MG tablet Take 1 tablet (20 mg total) by mouth at bedtime. 90 tablet 1  . mirtazapine (REMERON SOL-TAB) 15 MG disintegrating tablet Take 1 tablet (15 mg total) by mouth at bedtime. 30 tablet 3  . Multiple Vitamins-Minerals (MULTIVITAMIN WITH MINERALS) tablet Take 1 tablet by mouth daily.    Marland Kitchen omega-3  acid ethyl esters (LOVAZA) 1 G capsule TAKE 2 CAPSULES BY MOUTH TWICE DAILY 360 capsule 1  . oxyCODONE-acetaminophen (PERCOCET/ROXICET) 5-325 MG tablet Take 1 tablet by mouth every 8 (eight) hours as needed for severe pain. 90 tablet 0  . potassium chloride SA (K-DUR,KLOR-CON) 20 MEQ tablet TAKE 3 TABLET BY MOUTH EVERY DAY 270 tablet 3  . tamsulosin (FLOMAX) 0.4 MG CAPS capsule Take 0.4 mg by mouth daily.     Marland Kitchen torsemide (DEMADEX) 20 MG tablet Take 80 mg ( 4 tablets ) twice a day 224 tablet 3  . traZODone (DESYREL) 100 MG tablet Take 1 tablet (100 mg total) by mouth 2 (two) times daily. 180 tablet 1   No current facility-administered medications on file prior to visit.   Past Medical History  Diagnosis Date  . CHF (congestive heart failure) (HCC)     Preserved EF  . Hypertension   . Hyperlipidemia   . Enlarged prostate   . Pneumonia 05/2014  . DM II (diabetes mellitus, type II), controlled ([place])     Diet controlled.   . Degenerative joint disease of knee, right   . Arthritis     "right hand; right knee" (06/19/2014)  . SMALL BOWEL OBSTRUCTION, HX OF 08/21/2007    Annotation: with narrowing in the ileocecal region  Qualifier: Diagnosis of  By: Hassell Done FNP, Tori Milks    . Scrotal edema 04/03/2015     Review of Systems  Respiratory: Negative.   Cardiovascular: Negative.   Gastrointestinal: Negative.   All other systems reviewed and are negative.      Filed Vitals:   07/17/15 1046  BP: 136/75  Pulse: 62  Temp: 97.6 F (36.4 C)  TempSrc: Oral  Height: 5\' 9"  (1.753 m)  Weight: 201 lb (91.173 kg)    Objective:   Physical Exam  Constitutional: He appears well-developed. No distress.  Cardiovascular: Normal rate, regular rhythm, normal heart sounds and intact distal pulses.   No murmur heard. Pulmonary/Chest: Effort normal and breath sounds normal. No respiratory distress. He has no wheezes.  Abdominal: Soft. Bowel sounds are normal. He exhibits no distension and no mass. There  is no tenderness.  Musculoskeletal: Normal range of motion.  Trace edema of his right foot, no edema of his left foot.  Nursing note and vitals reviewed.      Assessment:     CHF HTN Depression Anemia  Prediabetes    Plan:     Check problem list.

## 2015-07-17 NOTE — Assessment & Plan Note (Signed)
No acute change. Continue Trazodone at 100 mg BID.

## 2015-07-17 NOTE — Assessment & Plan Note (Signed)
Hemoglobin POC checked today. Improved from 9.4 to >10. Continue ferrous sulfate. Seems to be at baseline.

## 2015-07-17 NOTE — Assessment & Plan Note (Signed)
Medication reviewed. Seems to be doing well with current regimen in term of fluid retention. I reviewed and discussed his most recent ECHO with him. His EF dropped from 40% to 20% within few months. I reviewed his cardiologist's note, cardiac cath is still recommended but he is still undecided. I again reiterated the need for him to get procedure done. He will follow up with cards as planned.

## 2015-07-17 NOTE — Assessment & Plan Note (Signed)
A1C checked today 4.9. I will resolve diagnosis of prediabetes.

## 2015-07-21 ENCOUNTER — Other Ambulatory Visit: Payer: Self-pay | Admitting: *Deleted

## 2015-07-21 NOTE — Patient Outreach (Signed)
Patrick [name]) Care Management  07/21/2015  Patrick [name] [birth year] [id number]  Patrick [name] left a HIPAA compliant message for Patrick [name], Family Services Coordinator with Patrick [name] [phone]), to check the status of patient's order to receive grab bars installed in patient's bathroom.  Patrick [name] has been unavailable to accept CSW's calls during the last few attempts; therefore, CSW decided to leave a message on voicemail today.  CSW is currently awaiting a return call. CSW was able to make contact with patient today to follow-up regarding the recent referral made for him to Patrick [name] to receive grab bars installed in his bathroom, at the expense of Patrick [name].  CSW explained all of the above information to patient, indicating that CSW is currently awaiting a return call to see when contractors with Patrick [name] will be available to install the grab bars in patient's bathroom.  CSW agreed to follow-up with patient as soon as a return call is received.  Patient voiced understanding and was agreeable to this plan. Patrick [name], BSW, MSW, LCSW  Licensed Education officer, environmental Health System  Mailing [address], [place], [place] [address] Physical Address-[address], [place], [place] [address] Toll Free Main # [phone] Fax # [phone] Cell # [phone]  Fax # [phone]  [name]  [employer]  Discrimination is Against the [employer]. and its subsidiaries comply with applicable Federal civil rights laws and do not discriminate on the basis of race, color, national origin, age, disability, or sex. [place] do not exclude people or treat them differently because of race, color, national origin, age, disability, or sex.    Yahoo. and its subsidiaries provide:  . Free auxiliary  aids and services, such as qualified sign language interpreters, video remote interpretation, and written information in other formats to people with disabilities when such auxiliary aids and services are necessary to ensure an equal opportunity to participate. . Free language services to people whose primary language is not English when those services are necessary to provide meaningful access, such as translated documents or oral interpretation.    If you need these services, call [phone] or if you use a TTY, call 711.   If you believe that Yahoo. and its subsidiaries have failed to provide these services or discriminated in another way on the basis of race, color, national origin, age, disability, or sex, you can file a Tourist information centre manager with:   Discrimination [place]riev[place] [address]   If you need help filing a grievance, call [phone] or if you use a TTY, call 711.  You can also file a civil rights complaint with the U.S. Department of Health and Financial controller, Office for Civil Rights electronically through the Office for Civil Rights Complaint Portal, available at OnSiteLending.nl.jsf, or by mail or phone at:   [place]. Department of Health and Human Services  [place], [place], [facility] Building  [place], [place]  [phone], [phone] (TDD)  Complaint forms are available at CutFunds.si [place]: ATTENTION: If you do not speak English, language assistance services, free of charge, are available to you. Call [phone] (TTY: R9478181).  Espaol (Spanish): ATENCIN: si habla espaol, tiene a su disposicin servicios gratuitos de asistencia lingstica. Llame al [phone] (TTY: [id number]). ???? (Chinese): ?????????????????????????????? [phone]?TTY: 711??  Ti?ng Vi?t (Vietnamese): CH : N?u b?n ni  Ti?ng Vi?t, c cc  d?ch v? h? tr? ngn ng? mi?n ph dnh cho b?n. G?i s? [phone] (TTY: R9478181).  ??? ([place]): ?? : ???? ????? ?? , ?? ?? ???? ??? ???? ? ???? . [phone] (TTY: 711)??? ??? ???? .  Tagalog (Tagalog - Filipino): PAUNAWA: Kung nagsasalita ka ng Tagalog, maaari kang gumamit ng mga serbisyo ng tulong sa wika nang walang bayad. [place] sa [phone] (TTY: R9478181).   Reunion): :      ,      .  [phone] (: [id number]).  Kreyl Ayisyen ([place]): ATANSYON: Si w pale Ethelene Hal, gen svis d pou lang ki disponib gratis pou ou. Rele [phone] (TTY: R9478181).  Fonnie Jarvis Marland KitchenPakistan): ATTENTION : Si vous parlez franais, des services d'aide linguistique vous sont proposs gratuitement. Appelez le [phone] (ATS : R9478181).  Polski (Polish): UWAGA: Jeeli mwisz po polsku, moesz skorzysta z bezpatnej pomocy jzykowej. Zadzwo pod numer [phone] (TTY: R9478181).  Portugus ([place]): ATENO: Se fala portugus, encontram-se disponveis servios lingusticos, grtis. Ligue para [phone] (TTY: R9478181).  Italiano ([place]): ATTENZIONE: In caso la lingua parlata sia l'italiano, sono disponibili servizi Patrick assistenza linguistica gratuiti. Chiamare il numero [phone] (TTY: R9478181).  Dawayne Patricia ([place]): ACHTUNG: Wenn Sie Deutsch sprechen,  stehen Ihnen kostenlos sprachliche Hilfsdienstleistungen zur [employer]. Rufnummer: [phone] (TTY: R9478181).   (Arabic): [phone]   .            : .)[id number] :   (  ??? ([place]): ??????????????????????????????????[phone] ?TTY?711?????????????????  ? (Farsi): [phone]  . ?   ? ?  ? ? ?~ ?  ?    : .??  (TTY: 711)  Din Bizaad ([place]): D77 baa ak0 n7n7zin: D77 saad  bee y1n7[ti'go Risa Grill, saad bee 1k1'1n7da'1wo'd66', t'11 Pricilla Loveless n1 h0l=, koj8' h0d77lnih [phone] (TTY:   R9478181).

## 2015-07-24 ENCOUNTER — Other Ambulatory Visit: Payer: Self-pay | Admitting: *Deleted

## 2015-07-24 NOTE — Patient Outreach (Signed)
Call placed to member to follow up on community resources regarding grab bars in his bathroom and current medical condition, with the intention to refer to health coach for further teaching.  No answer, HIPPA compliant voice message left.  Will await call back.  [name], BSN, [place] [facility]  [facility] Care Manager [phone]

## 2015-07-24 NOTE — Patient Outreach (Signed)
Call received back from member.  He reports that he is doing well, denies any concerns or needs at this time.  He state that he has been in contact with J. Saporito, Education officer, museum, for assistance with grab bars in his shower.  He denies the need for further education with the health coach, stating that he feels confident in caring for himself.  He state that he has changed his diet and monitors his weight daily.  Member made aware that this care manager would close case, but to contact the main office in the future if he feel assistance is needed.  Mrs. Saporito still active, will notify her than nursing case management is complete.  Patrick [name], BSN, [place] Management  [facility] Care Manager [phone]

## 2015-07-27 ENCOUNTER — Other Ambulatory Visit: Payer: Self-pay | Admitting: *Deleted

## 2015-07-27 NOTE — Patient Outreach (Signed)
[place] [facility]) Care Management  07/27/2015  Patrick [name] 1950-06-11 [id number]   CSW was able to make contact with Patrick [name], [facility] Coordinator today to ensure that patient has been enrolled in the "Aging Gracefully Program" through [employer] and that grab bars will be installed in patient's home for safety measures.  Patrick [name] reported that she has not yet had an opportunity to make initial contact with patient, but that she plans to do so within the next few days.  Patrick [name] went on to say that she will need to complete an assessment on patient to determine whether or not patient will be approved for the "Aging Gracefully Program".  If eligible, this would entitle patient to four visits from a home health physical therapist and six visits through home health nursing.  A program would be developed specific to patient's needs and recommendations would be made for further durable medical equipment and/or assistance devices to be installed in patient's home.  If for some reason patient is not approved for the "Aging Gracefully Program", Patrick [name] agreed to approve patient for installation of grab bars in the shower, since this is such a small request and should be an affordable expense.  Patrick [name] agreed to follow-up with CSW to report findings of initial phone conversation with patient.  Patrick [name], BSW, MSW, LCSW  Licensed Education officer, environmental Health System  Mailing [place] [address] [place], [place] [address] Physical Address-[address], [place], [place] [address] Toll Free Main # [phone] Fax # [phone] Cell # [phone]  Fax # [phone]  [email or web address]  Humana  Discrimination is Against the [employer]. and its subsidiaries comply with applicable Federal civil rights laws and do not discriminate on the basis of race, color, national origin,  age, disability, or sex. [place] do not exclude people or treat them differently because of race, color, national origin, age, disability, or sex.    [employer]. and its subsidiaries provide:  . Free auxiliary aids and services, such as qualified sign language interpreters, video remote interpretation, and written information in other formats to people with disabilities when such auxiliary aids and services are necessary to ensure an equal opportunity to participate. . Free language services to people whose primary language is not English when those services are necessary to provide meaningful access, such as translated documents or oral interpretation.    If you need these services, call [phone] or if you use a TTY, call 711.   If you believe that [employer]. and its subsidiaries have failed to provide these services or discriminated in another way on the basis of race, color, national origin, age, disability, or sex, you can file a Tourist information centre manager with:   Discrimination [place]riev[place] [address]   If you need help filing a grievance, call [phone] or if you use a TTY, call 711.  You can also file a civil rights complaint with the U.S. Department of Health and Financial controller, Office for Civil Rights electronically through the Office for Civil Rights Complaint Portal, available at OnSiteLending.nl.jsf, or by mail or phone at:   [place]. Department of Health and Human Services  [place], [place]  Room [phone], [facility] Building  [place], [place]  [phone], [phone] (TDD)  Complaint forms are available at [email or web address] [place]  English: ATTENTION: If you do not speak [place], language  assistance services, free of charge, are available to you. Call [phone] (TTY: [id number]).  Espaol (Spanish): ATENCIN: si habla espaol, tiene a  su disposicin servicios gratuitos de asistencia lingstica. Llame al [phone] (TTY: [id number]). ???? (Chinese): ?????????????????????????????? [phone]?TTY: 711??  Ti?ng Vi?t (Vietnamese): CH : N?u b?n ni Ti?ng Vi?t, c cc d?ch v? h? tr? ngn ng? mi?n ph dnh cho b?n. G?i s? [phone] (TTY: [id number]).  ??? ([place]): ?? : ???? ????? ?? , ?? ?? ???? ??? ???? ? ???? . [phone] (TTY: 711)??? ??? ???? .  Tagalog (Tagalog - Filipino): PAUNAWA: Kung nagsasalita ka ng Tagalog, maaari kang gumamit ng mga serbisyo ng tulong sa wika nang walang bayad. [place] sa [phone] (TTY: [id number]).   [place]union): :      ,      .  [phone] (: [id number]).  Kreyl Ayisyen ([place]): ATANSYON: Si w pale Ethelene Hal, gen svis d pou lang ki disponib gratis pou ou. Rele [phone] (TTY: [id number]).  Fonnie Jarvis [place] Kitchen[place]): ATTENTION : Si vous parlez franais, des services d'aide linguistique vous sont proposs gratuitement. Appelez le [phone] (ATS : [id number]).  Polski (Polish): UWAGA: Jeeli mwisz po polsku, moesz skorzysta z bezpatnej pomocy jzykowej. Zadzwo pod numer [phone] (TTY: [id number]).  Portugus ([place]): ATENO: Se fala portugus, encontram-se disponveis servios lingusticos, grtis. Ligue para [phone] (TTY: [id number]).  Italiano ([place]): ATTENZIONE: In caso la lingua parlata sia l'italiano, sono disponibili servizi di assistenza linguistica gratuiti. Chiamare il numero [phone] (TTY: [id number]).  Dawayne Patricia ([place]): ACHTUNG: Wenn Sie Deutsch sprechen,  stehen Ihnen kostenlos sprachliche Hilfsdienstleistungen zur [employer]. Rufnummer: [phone] (TTY: [id number]).   (Arabic): [phone]   .            : .)[id number] :   (  ??? ([place]):  ??????????????????????????????????[phone] ?TTY?711?????????????????  ? (Farsi): [phone]  . ?   ? ?  ? ? ?~ ?  ?    : .??  (TTY: 711)  Din Bizaad ([place]): D77 baa ak0 n7n7zin: D77 saad bee y1n7[ti'go Risa Grill, saad bee 1k1'1n7da'1wo'd66', t'11 Pricilla Loveless n1 h0l=, koj8' h0d77lnih [phone] (TTY:   [id number]).

## 2015-07-28 ENCOUNTER — Ambulatory Visit: Payer: Commercial Managed Care - HMO | Admitting: Podiatry

## 2015-07-30 ENCOUNTER — Other Ambulatory Visit: Payer: Self-pay | Admitting: *Deleted

## 2015-07-30 ENCOUNTER — Encounter: Payer: Self-pay | Admitting: *Deleted

## 2015-07-30 ENCOUNTER — Other Ambulatory Visit: Payer: Commercial Managed Care - HMO | Admitting: *Deleted

## 2015-07-30 NOTE — Patient Outreach (Signed)
[place] [facility]) Care Management  07/30/2015  OSWELL SAY [birth year] [id number]   CSW was able to make contact with patient today to ensure that patient has been approved for the "Aging Gracefully Program" through [employer].  Patient reported that he spoke with [name], Family Services Coordinator with [employer], who performed an assessment form on patient and deemed him appropriate for their "Aging Gracefully Program".  This will entitle patient to receive home modifications, as well as an individualized care plan, tailored specifically to his medical needs/cconcerns.  No additional social work needs have been identified at present. CSW will perform a case closure on patient, as all goals of treatment have been met from social work standpoint and no additional social work needs have been identified at this time. CSW will notify patient's RNCM with [facility], [name] of CSW's plans to close patient's case. CSW will fax a correspondence letter, as well as a case closure letter to patient's Primary Care Physician, Dr. Andrena Mews to ensure that Dr. Gwendlyn Deutscher is aware of CSW's case closure plans.   CSW will submit a case closure request to Lurline Del, Care Management Assistant with [facility], in the form of an In [employer].  CSW will ensure that Mrs. Laurance Flatten is aware of Roma Schanz, RNCM with [place], continued involvement with patient's care.  [name], BSW, MSW, LCSW  Licensed Education officer, environmental Health System  Mailing [place] [address] [place], [place] [address] Physical Address-[address], [place], [place] [address] Toll Free Main # [phone] Fax # [phone] Cell # [phone]  Fax # [phone]  Di [email or web address]  Humana  Discrimination is Against the  [employer]. and its subsidiaries comply with applicable Federal civil rights laws and do not discriminate on the basis of race, color, national origin, age, disability, or sex. [place] do not exclude people or treat them differently because of race, color, national origin, age, disability, or sex.    [employer]ahoo. and its subsidiaries provide:  . Free auxiliary aids and services, such as qualified sign language interpreters, video remote interpretation, and written information in other formats to people with disabilities when such auxiliary aids and services are necessary to ensure an equal opportunity to participate. . Free language services to people whose primary language is not English when those services are necessary to provide meaningful access, such as translated documents or oral interpretation.    If you need these services, call [phone] or if you use a TTY, call 711.   If you believe that [employer]. and its subsidiaries have failed to provide these services or discriminated in another way on the basis of race, color, national origin, age, disability, or sex, you can file a Tourist information centre manager with:   Discrimination [place]riev[place] [address]   If you need help filing a grievance, call [phone] or if you use a TTY, call 711.  You can also file a civil rights complaint with the U.S. Department of Health and Financial controller, Office for Civil Rights electronically through the Office for Civil Rights Complaint Portal, available at OnSiteLending.nl.jsf, or by mail or phone at:   [place]. Department of Health and Human Services  [address] [place]  Room [phone], [facility] Building  [place], [place]  [phone], [phone] (TDD)  Complaint forms are available at  CutFunds.si [place] [employer] Interpreter Services  English: ATTENTION: If you do not speak  [place], language assistance services, free of charge, are available to you. Call [phone] (TTY: 671).  Espaol (Spanish): ATENCIN: si habla espaol, tiene a su disposicin servicios gratuitos de asistencia lingstica. Llame al [phone] (TTY: 250). ???? (Chinese): ?????????????????????????????? [phone]?TTY: 711??  Ti?ng Vi?t (Vietnamese): CH : N?u b?n ni Ti?ng Vi?t, c cc d?ch v? h? tr? ngn ng? mi?n ph dnh cho b?n. G?i s? [phone] (TTY: 299).  ??? ([place]): ?? : ???? ????? ?? , ?? ?? ???? ??? ???? ? ???? . [phone] (TTY: 711)??? ??? ???? .  Tagalog (Tagalog - Filipino): PAUNAWA: Kung nagsasalita ka ng Tagalog, maaari kang gumamit ng mga serbisyo ng tulong sa wika nang walang bayad. [place] sa [phone] (TTY: 417).   Reunion): :      ,      .  [phone] (: 314).  Kreyl Ayisyen ([place]): ATANSYON: Si w pale Ethelene Hal, gen svis d pou lang ki disponib gratis pou ou. Rele [phone] (TTY: 502).  Fonnie Jarvis [place] KitchenPak[place]): ATTENTION : Si vous parlez franais, des services d'aide linguistique vous sont proposs gratuitement. Appelez le [phone] (ATS : 720).  Polski (Polish): UWAGA: Jeeli mwisz po polsku, moesz skorzysta z bezpatnej pomocy jzykowej. Zadzwo pod numer [phone] (TTY: 294).  Portugus ([place]): ATENO: Se fala portugus, encontram-se disponveis servios lingusticos, grtis. Ligue para [phone] (TTY: 568).  Italiano ([place]): ATTENZIONE: In caso la lingua parlata sia l'italiano, sono disponibili servizi di assistenza linguistica gratuiti. Chiamare il numero [phone] (TTY: 944).  Dawayne Patricia ([place]): ACHTUNG: Wenn Sie Deutsch sprechen,  stehen Ihnen kostenlos sprachliche Hilfsdienstleistungen zur [employer]. Rufnummer: [phone] (TTY: 659).   (Arabic): [phone]    .            : .)030 :   (  ??? ([place]): ??????????????????????????????????[phone] ?TTY?711?????????????????  ? (Farsi): [phone]  . ?   ? ?  ? ? ?~ ?  ?    : .??  (TTY: 711)  Din Bizaad ([place]): D77 baa ak0 n7n7zin: D77 saad bee y1n7[ti'go Risa Grill, saad bee 1k1'1n7da'1wo'd66', t'11 Pricilla Loveless n1 h0l=, koj8' h0d77lnih [phone] (TTY:   811).

## 2015-08-11 ENCOUNTER — Other Ambulatory Visit: Payer: Self-pay | Admitting: Internal Medicine

## 2015-08-12 ENCOUNTER — Encounter: Payer: Self-pay | Admitting: Podiatry

## 2015-08-12 ENCOUNTER — Ambulatory Visit (INDEPENDENT_AMBULATORY_CARE_PROVIDER_SITE_OTHER): Payer: Commercial Managed Care - HMO | Admitting: Podiatry

## 2015-08-12 DIAGNOSIS — B351 Tinea unguium: Secondary | ICD-10-CM | POA: Diagnosis not present

## 2015-08-12 DIAGNOSIS — M79676 Pain in unspecified toe(s): Secondary | ICD-10-CM

## 2015-08-12 NOTE — Progress Notes (Signed)
Patient ID: Patrick [name], male   DOB: Feb 15, 1951, 65 y.o.   MRN: [id number]   Expand All Collapse All   Patient ID: Patrick [name], male DOB: 1951/04/23, 65 y.o. MRN: [id number]  Subjective: This patient presents complaining of painful toenails and walking wearing shoes and request nail debridement. Patient also requesting diabetic shoes. He states that he is never taking any medication for diabetes and has been labeled as a prediabetic  Objective: Orientated 3 HAV bilaterally No open skin lesions bilaterally Pitting edema bilaterally The toenails are elongated, brittle, incurvated, deformed and tender to direct palpation 6-10  Assessment: Symptomatic onychomycoses 6-10  Plan: Debridement toenails 10 mechanically electronically without any bleeding Patient advised that unless he is diagnosed as a diabetic he would not qualify for diabetic shoes  Reappoint 3 months

## 2015-09-03 ENCOUNTER — Other Ambulatory Visit: Payer: Self-pay | Admitting: Family Medicine

## 2015-09-03 ENCOUNTER — Telehealth: Payer: Self-pay | Admitting: Family Medicine

## 2015-09-03 MED ORDER — TORSEMIDE 20 MG PO TABS: ORAL_TABLET | ORAL | Status: DC

## 2015-09-03 MED ORDER — MIRTAZAPINE 15 MG PO TBDP: 15.0000 mg | ORAL_TABLET | Freq: Every day | ORAL | Status: DC

## 2015-09-03 NOTE — Telephone Encounter (Signed)
He is not on Furosemide but on Torsemide. I refilled his meds. Let him know.

## 2015-09-03 NOTE — Telephone Encounter (Signed)
Need refill on his furosemide and mirtazapine.

## 2015-09-04 NOTE — Telephone Encounter (Signed)
Pt infomred. [name], Patrick [name]

## 2015-09-08 ENCOUNTER — Other Ambulatory Visit: Payer: Self-pay | Admitting: *Deleted

## 2015-09-08 ENCOUNTER — Ambulatory Visit: Payer: Commercial Managed Care - HMO | Admitting: Family Medicine

## 2015-09-08 MED ORDER — LOSARTAN POTASSIUM 50 MG PO TABS: 50.0000 mg | ORAL_TABLET | Freq: Every day | ORAL | Status: DC

## 2015-09-08 MED ORDER — OMEGA-3-ACID ETHYL ESTERS 1 G PO CAPS: 2.0000 | ORAL_CAPSULE | Freq: Two times a day (BID) | ORAL | Status: DC

## 2015-11-10 ENCOUNTER — Other Ambulatory Visit: Payer: Self-pay | Admitting: *Deleted

## 2015-11-10 MED ORDER — TRAZODONE HCL 100 MG PO TABS
100.0000 mg | ORAL_TABLET | Freq: Two times a day (BID) | ORAL | Status: DC
Start: 2015-11-10 — End: 2016-05-19

## 2015-11-10 MED ORDER — LOVASTATIN 20 MG PO TABS: 20.0000 mg | ORAL_TABLET | Freq: Every day | ORAL | Status: DC

## 2015-11-10 MED ORDER — FINASTERIDE 5 MG PO TABS: 5.0000 mg | ORAL_TABLET | Freq: Every day | ORAL | Status: DC

## 2015-11-18 ENCOUNTER — Ambulatory Visit: Payer: Commercial Managed Care - HMO | Admitting: Podiatry

## 2015-11-23 ENCOUNTER — Telehealth: Payer: Self-pay | Admitting: *Deleted

## 2015-11-23 NOTE — Telephone Encounter (Signed)
Oxycodone is pretty strong.[place] Kitchen Please have him come in soon to be seen. Will not change his pain meds or increase dose at this time.

## 2015-11-23 NOTE — Telephone Encounter (Signed)
LM for patient to call back.  Please assist him in making an appt with MD. Patrick [name]

## 2015-11-23 NOTE — Telephone Encounter (Signed)
Patient has an appt with Dr. Raeford Razor on 11-24-15. Jazmin Hartsell,CMA

## 2015-11-23 NOTE — Telephone Encounter (Signed)
Will forward to MD to advise. Tiffanny Lamarche,CMA  

## 2015-11-23 NOTE — Telephone Encounter (Signed)
Patient states that his right hip/thigh starting hurting lat Saturday evening. He says he started taking his Oxycodone as directed on the bottle when it started hurting but so far it has had not effect on the pain. Patient wants to know if Dr. Gwendlyn Deutscher thinks there is anything else that he could possibly try for the pain.

## 2015-11-24 ENCOUNTER — Ambulatory Visit (INDEPENDENT_AMBULATORY_CARE_PROVIDER_SITE_OTHER): Payer: Commercial Managed Care - HMO | Admitting: Family Medicine

## 2015-11-24 ENCOUNTER — Encounter: Payer: Self-pay | Admitting: Family Medicine

## 2015-11-24 VITALS — BP 122/66 | HR 60 | Temp 98.1°F | Wt 208.0 lb

## 2015-11-24 DIAGNOSIS — M79675 Pain in left toe(s): Secondary | ICD-10-CM

## 2015-11-24 MED ORDER — COLCHICINE 0.6 MG PO TABS: 0.6000 mg | ORAL_TABLET | Freq: Two times a day (BID) | ORAL | Status: DC

## 2015-11-24 NOTE — Patient Instructions (Addendum)
Thank you for coming in,   Please stop your statin while taking the colchicine.   If you run out then please give [place] a call.   Please follow up after your symptoms have resolved to have your uric acid level checked.   Please complete the medication as directed.   Please bring all of your medications with you to each visit.   Health maintenance items that are due.  Health Maintenance  Topic Date Due  .  Hepatitis C: One time screening is recommended by Center for Disease Control  (CDC) for  adults born from [birth year] through 1967.   Nov 14, 1950  . Colon Cancer Screening  06/13/1998  . Shingles Vaccine  06/13/2008  . Eye exam for diabetics  03/28/2014  . Complete foot exam   10/26/2014  . Tetanus Vaccine  12/16/2014  . Pneumonia vaccines (2 of 2 - PPSV23) 03/12/2015  . Flu Shot  12/29/2015  . Hemoglobin A1C  01/14/2016     Sign up for My Chart to have easy access to your labs results, and communication with your Primary care physician   Please feel free to call with any questions or concerns at any time, at [phone]. --Dr. Raeford Razor
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
## 2015-11-26 DIAGNOSIS — M79676 Pain in unspecified toe(s): Secondary | ICD-10-CM | POA: Insufficient documentation

## 2015-11-26 NOTE — Assessment & Plan Note (Signed)
Exam is consistent with gout  He had a history of gout a long time ago and started eating liver again would is possible to contribute  Start colchicine as avoiding NSAIDS with history of HF  Advised to follow up at end to treatment to make sure there is resolution  There was a fair amount of fluid so possible to tap for diagnosis.

## 2015-12-08 ENCOUNTER — Other Ambulatory Visit: Payer: Self-pay

## 2015-12-08 MED ORDER — CARVEDILOL 25 MG PO TABS: 25.0000 mg | ORAL_TABLET | Freq: Two times a day (BID) | ORAL | Status: DC

## 2015-12-15 ENCOUNTER — Other Ambulatory Visit: Payer: Self-pay | Admitting: *Deleted

## 2015-12-15 MED ORDER — COLCHICINE 0.6 MG PO TABS: 0.6000 mg | ORAL_TABLET | Freq: Two times a day (BID) | ORAL | Status: DC

## 2015-12-15 NOTE — Telephone Encounter (Signed)
Pt states that he is having pain in his toe, this has been going on for 1 day.   He said that he was given colchicine at his last appointment, and wants to know if he can get a refill . [name], [name], CMA

## 2015-12-16 ENCOUNTER — Ambulatory Visit (INDEPENDENT_AMBULATORY_CARE_PROVIDER_SITE_OTHER): Payer: Medicare Other | Admitting: Podiatry

## 2015-12-16 ENCOUNTER — Encounter: Payer: Self-pay | Admitting: Podiatry

## 2015-12-16 DIAGNOSIS — B351 Tinea unguium: Secondary | ICD-10-CM | POA: Diagnosis not present

## 2015-12-16 DIAGNOSIS — M79676 Pain in unspecified toe(s): Secondary | ICD-10-CM

## 2015-12-16 NOTE — Patient Instructions (Signed)
Diabetes and Foot Care Diabetes may cause you to have problems because of poor blood supply (circulation) to your feet and legs. This may cause the skin on your feet to become thinner, break easier, and heal more slowly. Your skin may become dry, and the skin may peel and crack. You may also have nerve damage in your legs and feet causing decreased feeling in them. You may not notice minor injuries to your feet that could lead to infections or more serious problems. Taking care of your feet is one of the most important things you can do for yourself.  HOME CARE INSTRUCTIONS  Wear shoes at all times, even in the house. Do not go barefoot. Bare feet are easily injured.  Check your feet daily for blisters, cuts, and redness. If you cannot see the bottom of your feet, use a mirror or ask someone for help.  Wash your feet with warm water (do not use hot water) and mild soap. Then pat your feet and the areas between your toes until they are completely dry. Do not soak your feet as this can dry your skin.  Apply a moisturizing lotion or petroleum jelly (that does not contain alcohol and is unscented) to the skin on your feet and to dry, brittle toenails. Do not apply lotion between your toes.  Trim your toenails straight across. Do not dig under them or around the cuticle. File the edges of your nails with an emery board or nail file.  Do not cut corns or calluses or try to remove them with medicine.  Wear clean socks or stockings every day. Make sure they are not too tight. Do not wear knee-high stockings since they may decrease blood flow to your legs.  Wear shoes that fit properly and have enough cushioning. To break in new shoes, wear them for just a few hours a day. This prevents you from injuring your feet. Always look in your shoes before you put them on to be sure there are no objects inside.  Do not cross your legs. This may decrease the blood flow to your feet.  If you find a minor scrape,  cut, or break in the skin on your feet, keep it and the skin around it clean and dry. These areas may be cleansed with mild soap and water. Do not cleanse the area with peroxide, alcohol, or iodine.  When you remove an adhesive bandage, be sure not to damage the skin around it.  If you have a wound, look at it several times a day to make sure it is healing.  Do not use heating pads or hot water bottles. They may burn your skin. If you have lost feeling in your feet or legs, you may not know it is happening until it is too late.  Make sure your health care provider performs a complete foot exam at least annually or more often if you have foot problems. Report any cuts, sores, or bruises to your health care provider immediately. SEEK MEDICAL CARE IF:   You have an injury that is not healing.  You have cuts or breaks in the skin.  You have an ingrown nail.  You notice redness on your legs or feet.  You feel burning or tingling in your legs or feet.  You have pain or cramps in your legs and feet.  Your legs or feet are numb.  Your feet always feel cold. SEEK IMMEDIATE MEDICAL CARE IF:   There is increasing redness,   swelling, or pain in or around a wound.  There is a red line that goes up your leg.  Pus is coming from a wound.  You develop a fever or as directed by your health care provider.  You notice a bad smell coming from an ulcer or wound.   This information is not intended to replace advice given to you by your health care provider. Make sure you discuss any questions you have with your health care provider.   Document Released: 05/13/2000 Document Revised: 01/16/2013 Document Reviewed: 10/23/2012 Elsevier Interactive Patient Education 2016 Elsevier Inc.  

## 2015-12-17 NOTE — Progress Notes (Signed)
Patient ID: Patrick [name], male   DOB: 05-Jul-1950, 65 y.o.   MRN: [id number]   Subjective: This patient presents complaining of painful toenails and walking wearing shoes and request nail debridement. Patient also requesting diabetic shoes. He states that he is never taking any medication for diabetes and has been labeled as a prediabetic  Objective: Orientated 3 HAV bilaterally No open skin lesions bilaterally Pitting edema bilaterally The toenails are elongated, brittle, incurvated, deformed and tender to direct palpation 6-10  Assessment: Symptomatic onychomycoses 6-10  Plan: Debridement toenails 10 mechanically electronically without any bleeding Patient advised that unless he is diagnosed as a diabetic he would not qualify for diabetic shoes  Reappoint 3 months

## 2015-12-22 ENCOUNTER — Other Ambulatory Visit: Payer: Self-pay | Admitting: Internal Medicine

## 2015-12-22 ENCOUNTER — Telehealth: Payer: Self-pay | Admitting: *Deleted

## 2015-12-22 ENCOUNTER — Other Ambulatory Visit: Payer: Self-pay | Admitting: *Deleted

## 2015-12-22 MED ORDER — TORSEMIDE 20 MG PO TABS: ORAL_TABLET | ORAL | 0 refills | Status: DC

## 2015-12-22 MED ORDER — TORSEMIDE 20 MG PO TABS: ORAL_TABLET | ORAL | 3 refills | Status: DC

## 2015-12-22 NOTE — Telephone Encounter (Signed)
Pt wants a 3 month supply of torsemide. Please advise. Deseree Kennon Holter, CMA

## 2015-12-22 NOTE — Telephone Encounter (Signed)
Please let Mr. Sloane know that I have refilled his Torsemide. Thank you!

## 2015-12-30 ENCOUNTER — Other Ambulatory Visit: Payer: Self-pay | Admitting: Family Medicine

## 2016-01-17 ENCOUNTER — Encounter (HOSPITAL_COMMUNITY): Payer: Self-pay

## 2016-01-17 ENCOUNTER — Emergency Department (HOSPITAL_COMMUNITY)
Admission: EM | Admit: 2016-01-17 | Discharge: 2016-01-17 | Disposition: A | Discharge status: Home / Self Care | Payer: Medicare Other | Attending: Emergency Medicine | Admitting: Emergency Medicine

## 2016-01-17 DIAGNOSIS — I110 Hypertensive heart disease with heart failure: Secondary | ICD-10-CM | POA: Diagnosis not present

## 2016-01-17 DIAGNOSIS — I5022 Chronic systolic (congestive) heart failure: Secondary | ICD-10-CM | POA: Diagnosis not present

## 2016-01-17 DIAGNOSIS — R339 Retention of urine, unspecified: Secondary | ICD-10-CM

## 2016-01-17 DIAGNOSIS — Z79899 Other long term (current) drug therapy: Secondary | ICD-10-CM | POA: Insufficient documentation

## 2016-01-17 DIAGNOSIS — Z87891 Personal history of nicotine dependence: Secondary | ICD-10-CM | POA: Insufficient documentation

## 2016-01-17 DIAGNOSIS — E119 Type 2 diabetes mellitus without complications: Secondary | ICD-10-CM | POA: Insufficient documentation

## 2016-01-17 DIAGNOSIS — Z7982 Long term (current) use of aspirin: Secondary | ICD-10-CM | POA: Insufficient documentation

## 2016-01-17 LAB — URINALYSIS, ROUTINE W REFLEX MICROSCOPIC
Bilirubin Urine: NEGATIVE
GLUCOSE, UA: NEGATIVE mg/dL
HGB URINE DIPSTICK: NEGATIVE
Ketones, ur: NEGATIVE mg/dL
LEUKOCYTES UA: NEGATIVE
Nitrite: NEGATIVE
PH: 6 (ref 5.0–8.0)
PROTEIN: NEGATIVE mg/dL
SPECIFIC GRAVITY, URINE: 1.014 (ref 1.005–1.030)

## 2016-01-17 NOTE — ED Notes (Signed)
Bladder scanned Pt. Scanner read >999 mL.

## 2016-01-17 NOTE — ED Provider Notes (Signed)
[facility] DEPT Provider Note   CSN: [id number] Arrival date & time: 01/17/16  1534     History   Chief Complaint Chief Complaint  Patient presents with  . Urinary Retention    HPI Patrick [name] is a 65 y.o. male.  Patient with a history of BPH presents today with a chief complaint of urinary obstruction.  He states that he was urinating normally yesterday.  However, today he feels as if he is unable to urinate.  He has only urinated once today and he reports that it was a very small quantity.  He is developing suprapubic abdominal pressure, which has progressively worsened. He denies any dysuria, fever, chills, nausea, or vomiting.  He states that he has a history of urinary retention in the past and had to have a catheter placed in the past.  He does not have a catheter at this time.  He is currently on Flomax and Proscar for the BPH.  He is followed by [facility] for the BPH.        Past Medical History:  Diagnosis Date  . Arthritis    "right hand; right knee" (06/19/2014)  . CHF (congestive heart failure) (HCC)    Preserved EF  . Degenerative joint disease of knee, right   . DM II (diabetes mellitus, type II), controlled ([place])    Diet controlled.   . Enlarged prostate   . Hyperlipidemia   . Hypertension   . Pneumonia 05/2014  . Prediabetes 10/03/2014  . Scrotal edema 04/03/2015  . SMALL BOWEL OBSTRUCTION, HX OF 08/21/2007   Annotation: with narrowing in the ileocecal region Qualifier: Diagnosis of  By: Hassell Done FNP, Tori Milks      Patient Active Problem List   Diagnosis Date Noted  . Great toe pain 11/26/2015  . 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 Chronic systolic heart failure ([place]) 06/17/2015  . Major depressive disorder, recurrent episode ([place]) 04/17/2015  . Hypokalemia 08/29/2014  . Other iron deficiency anemias   . Obesity, unspecified 10/03/2013  . Hyperlipidemia 08/13/2013  . KNEE PAIN, RIGHT 01/26/2010  . CARDIAC MURMUR 01/26/2010  .  MACULAR DEGENERATION 05/02/2008  . ERECTILE DYSFUNCTION 03/13/2007  . Essential hypertension 01/16/2007  . BENIGN PROSTATIC HYPERTROPHY, HX OF 01/16/2007    Past Surgical History:  Procedure Laterality Date  . INGUINAL HERNIA REPAIR Left 1990's       Home Medications    Prior to Admission medications   Medication Sig Start Date End Date Taking? Authorizing Provider  Ascorbic Acid (VITAMIN C) 1000 MG tablet Take 2,000 mg by mouth 2 (two) times daily.    Historical Provider, MD  aspirin EC 81 MG EC tablet Take 1 tablet (81 mg total) by mouth daily. 06/03/14   Lemoyne N Rumley, DO  carvedilol (COREG) 25 MG tablet Take 1 tablet (25 mg total) by mouth 2 (two) times daily. 12/08/15   Minus Breeding, MD  colchicine 0.6 MG tablet Take 1 tablet (0.6 mg total) by mouth 2 (two) times daily. 12/15/15   Sela Hua, MD  EQL SENNA LAXATIVE 8.6 MG tablet Take 8.6 mg by mouth 3 (three) times daily. Pt states he rotates between 2 and 3 tablets nightly 12/09/14   Historical Provider, MD  ferrous sulfate 325 (65 FE) MG tablet take 1 tablet by mouth once daily WITH BREAKFAST 08/11/15   Kinnie Feil, MD  finasteride (PROSCAR) 5 MG tablet Take 1 tablet (5 mg total) by mouth daily. 11/10/15   Kinnie Feil,  MD  losartan (COZAAR) 50 MG tablet Take 1 tablet (50 mg total) by mouth daily. 09/08/15   Kinnie Feil, MD  lovastatin (MEVACOR) 20 MG tablet Take 1 tablet (20 mg total) by mouth at bedtime. 11/10/15   Kinnie Feil, MD  mirtazapine (REMERON SOL-TAB) 15 MG disintegrating tablet place 1 tablet ON TONGUE at bedtime 12/30/15   Carlyle Dolly, MD  Multiple Vitamins-Minerals (MULTIVITAMIN WITH MINERALS) tablet Take 1 tablet by mouth daily.    Historical Provider, MD  omega-3 acid ethyl esters (LOVAZA) 1 g capsule Take 2 capsules (2 g total) by mouth 2 (two) times daily. 09/08/15   Kinnie Feil, MD  oxyCODONE-acetaminophen (PERCOCET/ROXICET) 5-325 MG tablet Take 1 tablet by mouth every 8 (eight)  hours as needed for severe pain. 05/12/15   Kinnie Feil, MD  potassium chloride SA (K-DUR,KLOR-CON) 20 MEQ tablet TAKE 3 TABLET BY MOUTH EVERY DAY 06/03/15   Minus Breeding, MD  tamsulosin (FLOMAX) 0.4 MG CAPS capsule Take 0.4 mg by mouth daily.     Historical Provider, MD  torsemide (DEMADEX) 20 MG tablet Take 80 mg ( 4 tablets ) twice a day 12/22/15   Sela Hua, MD  traZODone (DESYREL) 100 MG tablet Take 1 tablet (100 mg total) by mouth 2 (two) times daily. 11/10/15   Kinnie Feil, MD    Family History Family History  Problem Relation Age of Onset  . Hypertension Mother   . Diabetes Mother   . Stroke Mother   . Cancer Father 39    Prostate  . Dementia Father   . COPD Sister   . Arthritis Sister   . Edema Sister     Social History Social History  Substance Use Topics  . Smoking status: Former Smoker    Types: Pipe, Landscape architect  . Smokeless tobacco: Never Used     Comment: 06/19/2014 "stopped smoking in ~ 2014; used to smoke a pipe or cigar a couple times/month"  . Alcohol use No     Allergies   Review of patient's allergies indicates no known allergies.   Review of Systems Review of Systems  All other systems reviewed and are negative.    Physical Exam Updated Vital Signs BP 147/82 (BP Location: Left Arm)   Pulse 66   Temp 97.7 F (36.5 C) (Oral)   Resp 16   SpO2 99%   Physical Exam  Constitutional: He appears well-developed and well-nourished.  HENT:  Head: Normocephalic and atraumatic.  Neck: Normal range of motion. Neck supple.  Cardiovascular: Normal rate, regular rhythm and normal heart sounds.   Pulmonary/Chest: Effort normal and breath sounds normal.  Abdominal: He exhibits distension. There is tenderness in the suprapubic area.  Musculoskeletal: Normal range of motion.  Neurological: He is alert.  Skin: Skin is warm and dry.  Psychiatric: He has a normal mood and affect.  Nursing note and vitals reviewed.    ED Treatments / Results    Labs (all labs ordered are listed, but only abnormal results are displayed) Labs Reviewed  URINALYSIS, ROUTINE W REFLEX MICROSCOPIC (NOT AT [facility])    EKG  EKG Interpretation None       Radiology No results found.  Procedures Procedures (including critical care time)  Medications Ordered in ED Medications - No data to display   Initial Impression / Assessment and Plan / ED Course  I have reviewed the triage vital signs and the nursing notes.  Pertinent labs & imaging results that were available  during my care of the patient were reviewed by me and considered in my medical decision making (see chart for details).  Clinical Course   6:33 PM Reassessed patient.  He reports significant improvement in symptoms at this time.  Final Clinical Impressions(s) / ED Diagnoses   Final diagnoses:  None   Patient with a history of BPH presents today with urinary retention onset today.  He reports that he was urinating normally yesterday.  Bladder scan performed and he has a PVR of >999.  Foley catheter placed and 875 cc was drained.  Foley catheter left in and patient instructed to follow up with Urology.  UA negative for infection.  Patient stable for discharge.  Return precautions given.  Patient also discussed with Dr Lacinda Axon who is in agreement with the plan.    New Prescriptions New Prescriptions   No medications on file     Hyman Bible, Hershal Coria 01/19/16 2217    [name], MD 01/20/16 1316

## 2016-01-17 NOTE — ED Triage Notes (Signed)
Called to triage x 1. No response.

## 2016-01-17 NOTE — ED Triage Notes (Signed)
Pt states unable to urinate. Pt states minimal output. Complaining of lower abdominal pain. Pt states hx of same. States placed catheter last time.

## 2016-01-25 ENCOUNTER — Ambulatory Visit (INDEPENDENT_AMBULATORY_CARE_PROVIDER_SITE_OTHER): Payer: Medicare Other | Admitting: Internal Medicine

## 2016-01-25 ENCOUNTER — Encounter: Payer: Self-pay | Admitting: Internal Medicine

## 2016-01-25 VITALS — BP 130/68 | HR 62 | Temp 97.6°F | Wt 209.0 lb

## 2016-01-25 DIAGNOSIS — Z0000 Encounter for general adult medical examination without abnormal findings: Secondary | ICD-10-CM | POA: Diagnosis not present

## 2016-01-25 DIAGNOSIS — I10 Essential (primary) hypertension: Secondary | ICD-10-CM | POA: Diagnosis not present

## 2016-01-25 DIAGNOSIS — R338 Other retention of urine: Secondary | ICD-10-CM | POA: Diagnosis not present

## 2016-01-25 DIAGNOSIS — Z23 Encounter for immunization: Secondary | ICD-10-CM

## 2016-01-25 MED ORDER — MIRTAZAPINE 15 MG PO TBDP: ORAL_TABLET | ORAL | 0 refills | Status: DC

## 2016-01-25 NOTE — Assessment & Plan Note (Addendum)
-   Hep C ordered today - Pt given Pneumococcal vaccine - Zostavax ordered sent to pharmacy - Pt just had flu vaccine on 8/17 - Discussed colonoscopy, but Pt declines to have this done

## 2016-01-25 NOTE — Progress Notes (Signed)
   [facility] Phone: [phone]  Subjective:  Hypertension: Checks BPs occasionally at home. BPs have been 110/70-160/90. Tolerating meds without any side effects. No chest pain, no shortness of breath.   ROS: See HPI for pertinent positives and negatives Past Medical History- HTN, congestive dilated cardiomyopathy, macular degeneration, HLD.  Reviewed problem list.  Medications- reviewed and updated Current Outpatient Prescriptions  Medication Sig Dispense Refill  . Ascorbic Acid (VITAMIN C) 1000 MG tablet Take 2,000 mg by mouth 2 (two) times daily.    Marland Kitchen aspirin EC 81 MG EC tablet Take 1 tablet (81 mg total) by mouth daily. 30 tablet 0  . carvedilol (COREG) 25 MG tablet Take 1 tablet (25 mg total) by mouth 2 (two) times daily. 180 tablet 3  . colchicine 0.6 MG tablet Take 1 tablet (0.6 mg total) by mouth 2 (two) times daily. 14 tablet 0  . EQL SENNA LAXATIVE 8.6 MG tablet Take 8.6 mg by mouth 3 (three) times daily. Pt states he rotates between 2 and 3 tablets nightly    . ferrous sulfate 325 (65 FE) MG tablet take 1 tablet by mouth once daily WITH BREAKFAST 30 tablet 3  . finasteride (PROSCAR) 5 MG tablet Take 1 tablet (5 mg total) by mouth daily. 90 tablet 1  . losartan (COZAAR) 50 MG tablet Take 1 tablet (50 mg total) by mouth daily. 90 tablet 1  . lovastatin (MEVACOR) 20 MG tablet Take 1 tablet (20 mg total) by mouth at bedtime. 90 tablet 1  . mirtazapine (REMERON SOL-TAB) 15 MG disintegrating tablet place 1 tablet ON TONGUE at bedtime 30 tablet 0  . Multiple Vitamins-Minerals (MULTIVITAMIN WITH MINERALS) tablet Take 1 tablet by mouth daily.    Marland Kitchen omega-3 acid ethyl esters (LOVAZA) 1 g capsule Take 2 capsules (2 g total) by mouth 2 (two) times daily. 360 capsule 1  . oxyCODONE-acetaminophen (PERCOCET/ROXICET) 5-325 MG tablet Take 1 tablet by mouth every 8 (eight) hours as needed for severe pain. 90 tablet 0  . potassium chloride SA (K-DUR,KLOR-CON) 20 MEQ tablet  TAKE 3 TABLET BY MOUTH EVERY DAY 270 tablet 3  . tamsulosin (FLOMAX) 0.4 MG CAPS capsule Take 0.4 mg by mouth daily.     Marland Kitchen torsemide (DEMADEX) 20 MG tablet Take 80 mg ( 4 tablets ) twice a day 720 tablet 0  . traZODone (DESYREL) 100 MG tablet Take 1 tablet (100 mg total) by mouth 2 (two) times daily. 180 tablet 1   No current facility-administered medications for this visit.    Chief complaint-noted Family history reviewed for today's visit. No changes. Social history- patient is a former smoker.  Objective: Wt 209 lb (94.8 kg)   BMI 30.86 kg/m  Gen: NAD, alert, cooperative with exam HEENT: NCAT, EOMI, MMM Neck: FROM, supple CV: RRR, no murmur Resp: CTABL, no wheezes, normal work of breathing Msk: No edema, warm, normal tone, moves UE/LE spontaneously Neuro: Alert and oriented, no gross deficits Skin: No rashes, no lesions Psych: Appropriate behavior  Assessment/Plan: Hypertension: Well-controlled. BP 130/68 today. Tolerating meds without any side effects. - Continue Coreg 25mg  bid, Cozaar 50mg  daily - Follow-up in 6 months  Health Care Maintenance: - Hep C ordered today - Pt given Pneumococcal vaccine - Zostavax ordered sent to pharmacy - Pt just had flu vaccine on 8/17 - Discussed colonoscopy, but Pt declines to have this done   Hyman Bible, MD PGY-2

## 2016-01-25 NOTE — Assessment & Plan Note (Signed)
Well-controlled. BP 130/68 today. Tolerating meds without any side effects. - Continue Coreg 25mg  bid, Cozaar 50mg  daily - Follow-up in 6 months

## 2016-01-25 NOTE — Patient Instructions (Signed)
It was nice to see you!  We checked a Hepatitis C lab today- we recommend this be done for all adult patients.  We talked about a colonoscopy today. Please let me know if you decide to have this done.  We will see you back in 6 months!  -Dr. Brett Albino

## 2016-01-26 ENCOUNTER — Encounter: Payer: Self-pay | Admitting: *Deleted

## 2016-01-26 LAB — HEPATITIS C ANTIBODY: HCV AB: NEGATIVE

## 2016-01-26 NOTE — Progress Notes (Signed)
Letter mailed to patient and lm for him to call back. Jazmin Hartsell,CMA

## 2016-02-02 ENCOUNTER — Telehealth: Payer: Self-pay

## 2016-02-02 ENCOUNTER — Telehealth: Payer: Self-pay | Admitting: Cardiology

## 2016-02-02 NOTE — Telephone Encounter (Signed)
Pt states that Dr. Percival Spanish wants him to have a heart cath for months. Pt states procedure has not been scheduled his cath due to the fact that he is his sisters primary care giver and has no one to sit or assist with him long enough for the procedure. Pt states that recently his sister was admitted to the hospital and is possible going to a skilled nursing facility. If this happens he will have freedom in his schedule to have the procedure.   I informed the patient I would forward all this information to Dr. Percival Spanish and that they could follow up accordingly. Pt also had questions about timing of procedure. I told him that he would have to have blood work before he could be scheduled and the blood work would only be valid for 14 days. Pt understood and was agreeable.

## 2016-02-02 NOTE — Telephone Encounter (Signed)
New Message  Pt all requesting to speak with RN about scheduling a heart cath procedure. Please call back to discuss

## 2016-02-02 NOTE — Telephone Encounter (Signed)
Returned patient call-pt would like to go ahead and schedule heart cath that MD Hochrein has been wanting to do.  Reports he has transportation issues but as long as he has 72 hour notice he should be able to get transportation worked out.  Advised I would send to MD Hochrein for approval for cath and/or recommendations and we would plan from there.  Pt aware and verbalized understanding.   LOV 01/17 with Luisa Dago PA-C, plan was to proceed with [facility] at that time but was never completed.

## 2016-02-02 NOTE — Telephone Encounter (Signed)
Leave message for pt to call back 

## 2016-02-02 NOTE — Telephone Encounter (Signed)
He will need to be seen by an APP to schedule him for a cath.

## 2016-02-03 NOTE — Telephone Encounter (Signed)
Returning your call from yesterday. °

## 2016-02-03 NOTE — Telephone Encounter (Signed)
Pt have appt for 09/08 @2 :00 pm with Kerin Ransom

## 2016-02-05 ENCOUNTER — Ambulatory Visit (INDEPENDENT_AMBULATORY_CARE_PROVIDER_SITE_OTHER): Payer: Medicare Other | Admitting: Cardiology

## 2016-02-05 ENCOUNTER — Encounter: Payer: Self-pay | Admitting: Cardiology

## 2016-02-05 VITALS — BP 120/64 | HR 55 | Ht 69.0 in | Wt 201.8 lb

## 2016-02-05 DIAGNOSIS — N183 Chronic kidney disease, stage 3 unspecified: Secondary | ICD-10-CM | POA: Insufficient documentation

## 2016-02-05 DIAGNOSIS — N400 Enlarged prostate without lower urinary tract symptoms: Secondary | ICD-10-CM

## 2016-02-05 DIAGNOSIS — I10 Essential (primary) hypertension: Secondary | ICD-10-CM

## 2016-02-05 DIAGNOSIS — I5022 Chronic systolic (congestive) heart failure: Secondary | ICD-10-CM | POA: Diagnosis not present

## 2016-02-05 DIAGNOSIS — I255 Ischemic cardiomyopathy: Secondary | ICD-10-CM

## 2016-02-05 DIAGNOSIS — Z01818 Encounter for other preprocedural examination: Secondary | ICD-10-CM | POA: Diagnosis not present

## 2016-02-05 DIAGNOSIS — R5383 Other fatigue: Secondary | ICD-10-CM

## 2016-02-05 DIAGNOSIS — E785 Hyperlipidemia, unspecified: Secondary | ICD-10-CM

## 2016-02-05 DIAGNOSIS — R9439 Abnormal result of other cardiovascular function study: Secondary | ICD-10-CM

## 2016-02-05 DIAGNOSIS — D689 Coagulation defect, unspecified: Secondary | ICD-10-CM

## 2016-02-05 NOTE — Patient Instructions (Signed)
Medication Instructions:  Your physician recommends that you continue on your current medications as directed. Please refer to the Current Medication list given to you today.   Testing/Procedures: Your physician has requested that you have a cardiac catheterization @ [facility]. Cardiac catheterization is used to diagnose and/or treat various heart conditions. Doctors may recommend this procedure for a number of different reasons. The most common reason is to evaluate chest pain. Chest pain can be a symptom of coronary artery disease (CAD), and cardiac catheterization can show whether plaque is narrowing or blocking your heart's arteries. This procedure is also used to evaluate the valves, as well as measure the blood flow and oxygen levels in different parts of your heart. For further information please visit [email or web address]. Please follow instruction sheet, as given.  Following your catheterization, you will not be allowed to drive for 3 days.  No lifting, pushing, or pulling greater that 10 pounds is allowed for 1 week.  You will be required to have the following tests prior to the procedure:  1. Blood work - the blood work can be done no more than 14 days prior to the procedure.  It can be done at any [facility] lab. There is a lab downstairs on the first floor of this building in [address] and one at [address].  2. Chest X-ray - this can be done at [facility] in the [place] @ [address]   Follow-Up: Your physician recommends that you schedule a follow-up appointment in: [place] with DR [facility].  Any Other Special Instructions Will Be Listed Below (If Applicable).  SCHEDULE LEFT AND RIGHT HEART CATH   If you need a refill on your cardiac medications before your next appointment, please call your pharmacy.

## 2016-02-05 NOTE — Assessment & Plan Note (Signed)
Last SCr 1.22 in Jan 2017

## 2016-02-05 NOTE — Assessment & Plan Note (Signed)
Presumably ischemic with abnormal (high risk) Myoview march 2016 EF 25% by echo Jan 2017

## 2016-02-05 NOTE — Assessment & Plan Note (Signed)
Followed at [facility] In ED with obstruction 01/17/16

## 2016-02-05 NOTE — Assessment & Plan Note (Signed)
Intermittent flairs of acute on chronic CHF, last admitted Jan 2017

## 2016-02-05 NOTE — Assessment & Plan Note (Signed)
Controlled.  

## 2016-02-05 NOTE — Assessment & Plan Note (Signed)
Followed by PCP

## 2016-02-05 NOTE — Assessment & Plan Note (Signed)
Abnormal Myoview March 2016- declined coronary angiogram then

## 2016-02-05 NOTE — Progress Notes (Signed)
02/05/2016 Patrick [name]   1951-01-16  [id number]  Primary Physician Evette Doffing, MD Primary Cardiologist: Dr Percival Spanish  HPI:  65 y/o male with a history of presumed ischemic cardiomyopathy. He had an abnormal Myoview in March 2016 that showed inferior scar with anterior ischemia. 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 In Jan 2017 echo showed his EF to be 20%, down from 40%. Dr Percival Spanish again recommended cath but he again declined. Recently his sister developed an infected leg and had to go to SNF for rehab. He feels now is a good time for him to have the cath and he is in the office to discuss this. He denies chest pain. He has intermittent LE edema and occasional DOE but denies orthopnea. He was seen in the ED 01/17/16 with urinary outlet obstruction (hx of BPH). He had a catheter placed that was later removed by his urologist and he has had no further difficulty voiding.    Current Outpatient Prescriptions  Medication Sig Dispense Refill  . Ascorbic Acid (VITAMIN C) 1000 MG tablet Take 2,000 mg by mouth 2 (two) times daily.    Marland Kitchen aspirin EC 81 MG EC tablet Take 1 tablet (81 mg total) by mouth daily. 30 tablet 0  . carvedilol (COREG) 25 MG tablet Take 1 tablet (25 mg total) by mouth 2 (two) times daily. 180 tablet 3  . ferrous sulfate 325 (65 FE) MG tablet take 1 tablet by mouth once daily WITH BREAKFAST 30 tablet 3  . finasteride (PROSCAR) 5 MG tablet Take 1 tablet (5 mg total) by mouth daily. 90 tablet 1  . losartan (COZAAR) 50 MG tablet Take 1 tablet (50 mg total) by mouth daily. 90 tablet 1  . lovastatin (MEVACOR) 20 MG tablet Take 1 tablet (20 mg total) by mouth at bedtime. 90 tablet 1  . mirtazapine (REMERON SOL-TAB) 15 MG disintegrating tablet place 1 tablet ON TONGUE at bedtime 90 tablet 0  . Multiple Vitamins-Minerals (MULTIVITAMIN WITH MINERALS) tablet Take 1 tablet by  mouth daily.    Marland Kitchen omega-3 acid ethyl esters (LOVAZA) 1 g capsule Take 2 capsules (2 g total) by mouth 2 (two) times daily. 360 capsule 1  . oxyCODONE-acetaminophen (PERCOCET/ROXICET) 5-325 MG tablet Take 1 tablet by mouth every 8 (eight) hours as needed for severe pain. 90 tablet 0  . potassium chloride SA (K-DUR,KLOR-CON) 20 MEQ tablet TAKE 3 TABLET BY MOUTH EVERY DAY 270 tablet 3  . tamsulosin (FLOMAX) 0.4 MG CAPS capsule Take 0.4 mg by mouth daily.     Marland Kitchen torsemide (DEMADEX) 20 MG tablet Take 80 mg ( 4 tablets ) twice a day 720 tablet 0  . traZODone (DESYREL) 100 MG tablet Take 1 tablet (100 mg total) by mouth 2 (two) times daily. 180 tablet 1   No current facility-administered medications for this visit.     No Known Allergies  Social History   Social History  . Marital status: Single    Spouse name: N/A  . Number of children: N/A  . Years of education: N/A   Occupational History  . Not on file.   Social History Main Topics  . Smoking status: Former Smoker    Types: Pipe, Landscape architect  . Smokeless tobacco: Never Used     Comment: 06/19/2014 "stopped smoking in ~ 2014; used to smoke a pipe or cigar  a couple times/month"  . Alcohol use No  . Drug use: No  . Sexual activity: No   Other Topics Concern  . Not on file   Social History Narrative  . No narrative on file     Review of Systems: General: negative for chills, fever, night sweats or weight changes.  Cardiovascular: negative for chest pain, dyspnea on exertion, edema, orthopnea, palpitations, paroxysmal nocturnal dyspnea or shortness of breath Dermatological: negative for rash Respiratory: negative for cough or wheezing Urologic: negative for hematuria Abdominal: negative for nausea, vomiting, diarrhea, bright red blood per rectum, melena, or hematemesis Neurologic: negative for visual changes, syncope, or dizziness All other systems reviewed and are otherwise negative except as noted above.    Blood pressure  120/64, pulse (!) 55, height 5\' 9"  (1.753 m), weight 201 lb 12.8 oz (91.5 kg).  General appearance: alert, cooperative, no distress and mildly obese Neck: no JVD Lungs: clear to auscultation bilaterally Heart: regular rate and rhythm Abdomen: soft, non-tender; bowel sounds normal; no masses,  no organomegaly Extremities: trace LE edema Skin: pale, cool, dry Neurologic: Grossly normal  EKG NSR, SB-52, 1st AVB (PR 300), PVCs, RBBB (same as Sept 2016)  ASSESSMENT AND PLAN:   Cardiomyopathy, ischemic Presumably ischemic with abnormal (high risk) Myoview march 2016 EF 25% by echo Jan 2017  Abnormal finding on cardiovascular stress test Abnormal Myoview March 2016- declined coronary angiogram then  Chronic renal disease, stage III Last SCr 1.22 in Jan 0000000  Chronic systolic heart failure (HCC) Intermittent flairs of acute on chronic CHF, last admitted Jan 2017  Dyslipidemia Followed by PCP  Essential hypertension Controlled  BPH (benign prostatic hyperplasia) Followed at [facility] In ED with obstruction 01/17/16   PLAN  Discussed with Dr Percival Spanish- proceed with Rt and Lt heart cath. The patient understands that risks included but are not limited to stroke (1 in 1000), death (1 in 29), kidney failure [usually temporary] (1 in 500), bleeding (1 in 200), allergic reaction [possibly serious] (1 in 200).  The patient understands and agrees to proceed.   Kerin Ransom PA-C 02/05/2016 2:44 PM

## 2016-02-08 ENCOUNTER — Telehealth: Payer: Self-pay | Admitting: Cardiology

## 2016-02-08 NOTE — Telephone Encounter (Signed)
Please call,he have a question about the Cath he is going to have next week.

## 2016-02-08 NOTE — Telephone Encounter (Signed)
Returned call to patient-pt reports he is having a cath next week and was reading over the paperwork and realized he would not have anyone to stay with him for 24 hours when he arrives home.  Reports he has no family close or friends.  Reports he has neighbors but are not close enough to spend the night with them.  Pt does report that he knows neighbors well enough to ask them to check on him periodically when he gets home and also has their phone number to call them if needed.    Advised I feel that this would be okay as long as he has access to help if needed and neighbors could check on him periodically in the first 24 hours when he arrives home.  Pt verbalized understanding and advised to call with further questions/concerns.

## 2016-02-09 ENCOUNTER — Ambulatory Visit
Admission: RE | Admit: 2016-02-09 | Discharge: 2016-02-09 | Disposition: A | Discharge status: Home / Self Care | Payer: Medicare Other | Source: Ambulatory Visit | Attending: Cardiology | Admitting: Cardiology

## 2016-02-09 DIAGNOSIS — Z01818 Encounter for other preprocedural examination: Secondary | ICD-10-CM

## 2016-02-09 DIAGNOSIS — R5383 Other fatigue: Secondary | ICD-10-CM | POA: Diagnosis not present

## 2016-02-09 DIAGNOSIS — D689 Coagulation defect, unspecified: Secondary | ICD-10-CM | POA: Diagnosis not present

## 2016-02-10 LAB — CBC
HCT: 38 % — ABNORMAL LOW (ref 38.5–50.0)
Hemoglobin: 12.5 g/dL — ABNORMAL LOW (ref 13.2–17.1)
MCH: 27.2 pg (ref 27.0–33.0)
MCHC: 32.9 g/dL (ref 32.0–36.0)
MCV: 82.8 fL (ref 80.0–100.0)
MPV: 9.4 fL (ref 7.5–12.5)
Platelets: 109 10*3/uL — ABNORMAL LOW (ref 140–400)
RBC: 4.59 MIL/uL (ref 4.20–5.80)
RDW: 15.8 % — ABNORMAL HIGH (ref 11.0–15.0)
WBC: 6.2 10*3/uL (ref 3.8–10.8)

## 2016-02-10 LAB — COMPREHENSIVE METABOLIC PANEL
ALT: 12 U/L (ref 9–46)
AST: 19 U/L (ref 10–35)
Albumin: 3.9 g/dL (ref 3.6–5.1)
Alkaline Phosphatase: 97 U/L (ref 40–115)
BUN: 10 mg/dL (ref 7–25)
CO2: 29 mmol/L (ref 20–31)
Calcium: 8.9 mg/dL (ref 8.6–10.3)
Chloride: 106 mmol/L (ref 98–110)
Creat: 1.23 mg/dL (ref 0.70–1.25)
Glucose, Bld: 104 mg/dL — ABNORMAL HIGH (ref 65–99)
Potassium: 3.9 mmol/L (ref 3.5–5.3)
Sodium: 145 mmol/L (ref 135–146)
Total Bilirubin: 0.8 mg/dL (ref 0.2–1.2)
Total Protein: 7 g/dL (ref 6.1–8.1)

## 2016-02-10 LAB — PROTIME-INR
INR: 1.1
Prothrombin Time: 11.6 s — ABNORMAL HIGH (ref 9.0–11.5)

## 2016-02-10 LAB — TSH: TSH: 2.04 mIU/L (ref 0.40–4.50)

## 2016-02-16 ENCOUNTER — Encounter (HOSPITAL_COMMUNITY)
Admission: RE | Disposition: A | Discharge status: Home / Self Care | Payer: Self-pay | Source: Ambulatory Visit | Attending: Cardiology

## 2016-02-16 ENCOUNTER — Ambulatory Visit (HOSPITAL_COMMUNITY)
Admission: RE | Admit: 2016-02-16 | Discharge: 2016-02-16 | Disposition: A | Discharge status: Home / Self Care | Payer: Medicare Other | Source: Ambulatory Visit | Attending: Cardiology | Admitting: Cardiology

## 2016-02-16 DIAGNOSIS — N138 Other obstructive and reflux uropathy: Secondary | ICD-10-CM | POA: Insufficient documentation

## 2016-02-16 DIAGNOSIS — I130 Hypertensive heart and chronic kidney disease with heart failure and stage 1 through stage 4 chronic kidney disease, or unspecified chronic kidney disease: Secondary | ICD-10-CM | POA: Diagnosis not present

## 2016-02-16 DIAGNOSIS — I2510 Atherosclerotic heart disease of native coronary artery without angina pectoris: Secondary | ICD-10-CM | POA: Diagnosis not present

## 2016-02-16 DIAGNOSIS — Z87891 Personal history of nicotine dependence: Secondary | ICD-10-CM | POA: Diagnosis not present

## 2016-02-16 DIAGNOSIS — Z7982 Long term (current) use of aspirin: Secondary | ICD-10-CM | POA: Insufficient documentation

## 2016-02-16 DIAGNOSIS — I255 Ischemic cardiomyopathy: Secondary | ICD-10-CM | POA: Diagnosis not present

## 2016-02-16 DIAGNOSIS — N401 Enlarged prostate with lower urinary tract symptoms: Secondary | ICD-10-CM | POA: Diagnosis not present

## 2016-02-16 DIAGNOSIS — Y840 Cardiac catheterization as the cause of abnormal reaction of the patient, or of later complication, without mention of misadventure at the time of the procedure: Secondary | ICD-10-CM | POA: Diagnosis not present

## 2016-02-16 DIAGNOSIS — I5022 Chronic systolic (congestive) heart failure: Secondary | ICD-10-CM | POA: Diagnosis not present

## 2016-02-16 DIAGNOSIS — E785 Hyperlipidemia, unspecified: Secondary | ICD-10-CM | POA: Diagnosis not present

## 2016-02-16 DIAGNOSIS — I272 Other secondary pulmonary hypertension: Secondary | ICD-10-CM | POA: Insufficient documentation

## 2016-02-16 DIAGNOSIS — N183 Chronic kidney disease, stage 3 (moderate): Secondary | ICD-10-CM | POA: Insufficient documentation

## 2016-02-16 DIAGNOSIS — I97630 Postprocedural hematoma of a circulatory system organ or structure following a cardiac catheterization: Secondary | ICD-10-CM | POA: Diagnosis not present

## 2016-02-16 DIAGNOSIS — R9439 Abnormal result of other cardiovascular function study: Secondary | ICD-10-CM | POA: Diagnosis present

## 2016-02-16 HISTORY — PX: CARDIAC CATHETERIZATION: SHX172

## 2016-02-16 LAB — POCT I-STAT 3, ART BLOOD GAS (G3+)
ACID-BASE EXCESS: 5 mmol/L — AB (ref 0.0–2.0)
BICARBONATE: 30.2 mmol/L — AB (ref 20.0–28.0)
O2 Saturation: 97 %
TCO2: 32 mmol/L (ref 0–100)
pCO2 arterial: 44.9 mmHg (ref 32.0–48.0)
pH, Arterial: 7.436 (ref 7.350–7.450)
pO2, Arterial: 88 mmHg (ref 83.0–108.0)

## 2016-02-16 LAB — POCT I-STAT 3, VENOUS BLOOD GAS (G3P V)
ACID-BASE DEFICIT: 2 mmol/L (ref 0.0–2.0)
Acid-Base Excess: 6 mmol/L — ABNORMAL HIGH (ref 0.0–2.0)
BICARBONATE: 24.5 mmol/L (ref 20.0–28.0)
Bicarbonate: 31.6 mmol/L — ABNORMAL HIGH (ref 20.0–28.0)
O2 SAT: 64 %
O2 Saturation: 64 %
TCO2: 26 mmol/L (ref 0–100)
TCO2: 33 mmol/L (ref 0–100)
pCO2, Ven: 48.8 mmHg (ref 44.0–60.0)
pCO2, Ven: 52.2 mmHg (ref 44.0–60.0)
pH, Ven: 7.309 (ref 7.250–7.430)
pH, Ven: 7.39 (ref 7.250–7.430)
pO2, Ven: 34 mmHg (ref 32.0–45.0)
pO2, Ven: 37 mmHg (ref 32.0–45.0)

## 2016-02-16 LAB — CARDIAC CATHETERIZATION: CATHEFQUANT: 20 %

## 2016-02-16 SURGERY — RIGHT/LEFT HEART CATH AND CORONARY ANGIOGRAPHY

## 2016-02-16 MED ORDER — FENTANYL CITRATE (PF) 100 MCG/2ML IJ SOLN
INTRAMUSCULAR | Status: AC
  Filled 2016-02-16: qty 2

## 2016-02-16 MED ORDER — LIDOCAINE HCL (PF) 1 % IJ SOLN
INTRAMUSCULAR | Status: AC
  Filled 2016-02-16: qty 30

## 2016-02-16 MED ORDER — ACETAMINOPHEN 325 MG PO TABS: 650.0000 mg | ORAL_TABLET | ORAL | Status: DC | PRN

## 2016-02-16 MED ORDER — HEPARIN SODIUM (PORCINE) 1000 UNIT/ML IJ SOLN
INTRAMUSCULAR | Status: AC
  Filled 2016-02-16: qty 1

## 2016-02-16 MED ORDER — SODIUM CHLORIDE 0.9% FLUSH: 3.0000 mL | INTRAVENOUS | Status: DC | PRN

## 2016-02-16 MED ORDER — SODIUM CHLORIDE 0.9 % IV SOLN
INTRAVENOUS | Status: DC
  Administered 2016-02-16: 09:00:00 via INTRAVENOUS

## 2016-02-16 MED ORDER — SODIUM CHLORIDE 0.9 % IV SOLN: 250.0000 mL | INTRAVENOUS | Status: DC | PRN

## 2016-02-16 MED ORDER — FENTANYL CITRATE (PF) 100 MCG/2ML IJ SOLN
INTRAMUSCULAR | Status: DC | PRN
  Administered 2016-02-16: 25 ug via INTRAVENOUS

## 2016-02-16 MED ORDER — VERAPAMIL HCL 2.5 MG/ML IV SOLN
INTRAVENOUS | Status: AC
Start: 2016-02-16 — End: 2016-02-16
  Filled 2016-02-16: qty 2

## 2016-02-16 MED ORDER — FUROSEMIDE 10 MG/ML IJ SOLN
40.0000 mg | Freq: Once | INTRAMUSCULAR | Status: AC
  Administered 2016-02-16: 40 mg via INTRAVENOUS

## 2016-02-16 MED ORDER — MIDAZOLAM HCL 2 MG/2ML IJ SOLN
INTRAMUSCULAR | Status: DC | PRN
  Administered 2016-02-16: 2 mg via INTRAVENOUS

## 2016-02-16 MED ORDER — HEPARIN SODIUM (PORCINE) 1000 UNIT/ML IJ SOLN
INTRAMUSCULAR | Status: DC | PRN
  Administered 2016-02-16: 4500 [IU] via INTRAVENOUS

## 2016-02-16 MED ORDER — IOPAMIDOL (ISOVUE-370) INJECTION 76%
INTRAVENOUS | Status: DC | PRN
  Administered 2016-02-16: 120 mL via INTRA_ARTERIAL

## 2016-02-16 MED ORDER — ONDANSETRON HCL 4 MG/2ML IJ SOLN: 4.0000 mg | Freq: Four times a day (QID) | INTRAMUSCULAR | Status: DC | PRN

## 2016-02-16 MED ORDER — SODIUM CHLORIDE 0.9% FLUSH: 3.0000 mL | Freq: Two times a day (BID) | INTRAVENOUS | Status: DC

## 2016-02-16 MED ORDER — HYDRALAZINE HCL 20 MG/ML IJ SOLN
10.0000 mg | Freq: Once | INTRAMUSCULAR | Status: AC
  Administered 2016-02-16: 10 mg via INTRAVENOUS

## 2016-02-16 MED ORDER — VERAPAMIL HCL 2.5 MG/ML IV SOLN
INTRAVENOUS | Status: DC | PRN
  Administered 2016-02-16: 10 mL via INTRA_ARTERIAL

## 2016-02-16 MED ORDER — SODIUM CHLORIDE 0.9 % IV SOLN: INTRAVENOUS | Status: AC

## 2016-02-16 MED ORDER — FUROSEMIDE 10 MG/ML IJ SOLN
INTRAMUSCULAR | Status: AC
  Administered 2016-02-16: 40 mg via INTRAVENOUS
  Filled 2016-02-16: qty 4

## 2016-02-16 MED ORDER — HEPARIN (PORCINE) IN NACL 2-0.9 UNIT/ML-% IJ SOLN
INTRAMUSCULAR | Status: AC
Start: 2016-02-16 — End: 2016-02-16
  Filled 2016-02-16: qty 1000

## 2016-02-16 MED ORDER — IOPAMIDOL (ISOVUE-370) INJECTION 76%
INTRAVENOUS | Status: AC
  Filled 2016-02-16: qty 100

## 2016-02-16 MED ORDER — HYDRALAZINE HCL 20 MG/ML IJ SOLN
INTRAMUSCULAR | Status: AC
  Administered 2016-02-16: 10 mg via INTRAVENOUS
  Filled 2016-02-16: qty 1

## 2016-02-16 MED ORDER — ASPIRIN 81 MG PO CHEW
CHEWABLE_TABLET | ORAL | Status: AC
  Administered 2016-02-16: 81 mg via ORAL
  Filled 2016-02-16: qty 1

## 2016-02-16 MED ORDER — LIDOCAINE HCL (PF) 1 % IJ SOLN
INTRAMUSCULAR | Status: DC | PRN
  Administered 2016-02-16: 1 mL via INTRADERMAL
  Administered 2016-02-16: 2 mL via INTRADERMAL

## 2016-02-16 MED ORDER — MIDAZOLAM HCL 2 MG/2ML IJ SOLN
INTRAMUSCULAR | Status: AC
  Filled 2016-02-16: qty 2

## 2016-02-16 MED ORDER — ASPIRIN 81 MG PO CHEW
81.0000 mg | CHEWABLE_TABLET | ORAL | Status: AC
  Administered 2016-02-16: 81 mg via ORAL

## 2016-02-16 MED ORDER — HEPARIN (PORCINE) IN NACL 2-0.9 UNIT/ML-% IJ SOLN
INTRAMUSCULAR | Status: DC | PRN
  Administered 2016-02-16: 1000 mL

## 2016-02-16 MED ORDER — IOPAMIDOL (ISOVUE-370) INJECTION 76%
INTRAVENOUS | Status: AC
  Filled 2016-02-16: qty 50

## 2016-02-16 SURGICAL SUPPLY — 15 items
CATH BALLN WEDGE 5F 110CM (CATHETERS) ×1 IMPLANT
CATH IMPULSE 5F ANG/FL3.5 (CATHETERS) ×1 IMPLANT
CATH INFINITI 5 FR 3DRC (CATHETERS) ×1 IMPLANT
DEVICE RAD COMP TR BAND LRG (VASCULAR PRODUCTS) ×1 IMPLANT
GLIDESHEATH SLEND SS 6F .021 (SHEATH) ×1 IMPLANT
GUIDEWIRE .025 260CM (WIRE) ×1 IMPLANT
KIT HEART LEFT (KITS) ×2 IMPLANT
KIT HEART RIGHT NAMIC (KITS) ×2 IMPLANT
PACK CARDIAC CATHETERIZATION (CUSTOM PROCEDURE TRAY) ×2 IMPLANT
SHEATH FAST CATH BRACH 5F 5CM (SHEATH) ×1 IMPLANT
SYR MEDRAD MARK V 150ML (SYRINGE) ×2 IMPLANT
TRANSDUCER W/STOPCOCK (MISCELLANEOUS) ×3 IMPLANT
TUBING CIL FLEX 10 FLL-RA (TUBING) ×2 IMPLANT
WIRE EMERALD 3MM-J .035X260CM (WIRE) ×1 IMPLANT
WIRE HI TORQ VERSACORE-J 145CM (WIRE) ×2 IMPLANT

## 2016-02-16 NOTE — H&P (View-Only) (Signed)
02/05/2016 Patrick [name]   12/10/1950  [id number]  Primary Physician Evette Doffing, MD Primary Cardiologist: Dr Percival Spanish  HPI:  65 y/o male with a history of presumed ischemic cardiomyopathy. He had an abnormal Myoview in March 2016 that showed inferior scar with anterior ischemia. 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 In Jan 2017 echo showed his EF to be 20%, down from 40%. Dr Percival Spanish again recommended cath but he again declined. Recently his sister developed an infected leg and had to go to SNF for rehab. He feels now is a good time for him to have the cath and he is in the office to discuss this. He denies chest pain. He has intermittent LE edema and occasional DOE but denies orthopnea. He was seen in the ED 01/17/16 with urinary outlet obstruction (hx of BPH). He had a catheter placed that was later removed by his urologist and he has had no further difficulty voiding.    Current Outpatient Prescriptions  Medication Sig Dispense Refill  . Ascorbic Acid (VITAMIN C) 1000 MG tablet Take 2,000 mg by mouth 2 (two) times daily.    Marland Kitchen aspirin EC 81 MG EC tablet Take 1 tablet (81 mg total) by mouth daily. 30 tablet 0  . carvedilol (COREG) 25 MG tablet Take 1 tablet (25 mg total) by mouth 2 (two) times daily. 180 tablet 3  . ferrous sulfate 325 (65 FE) MG tablet take 1 tablet by mouth once daily WITH BREAKFAST 30 tablet 3  . finasteride (PROSCAR) 5 MG tablet Take 1 tablet (5 mg total) by mouth daily. 90 tablet 1  . losartan (COZAAR) 50 MG tablet Take 1 tablet (50 mg total) by mouth daily. 90 tablet 1  . lovastatin (MEVACOR) 20 MG tablet Take 1 tablet (20 mg total) by mouth at bedtime. 90 tablet 1  . mirtazapine (REMERON SOL-TAB) 15 MG disintegrating tablet place 1 tablet ON TONGUE at bedtime 90 tablet 0  . Multiple Vitamins-Minerals (MULTIVITAMIN WITH MINERALS) tablet Take 1 tablet by  mouth daily.    Marland Kitchen omega-3 acid ethyl esters (LOVAZA) 1 g capsule Take 2 capsules (2 g total) by mouth 2 (two) times daily. 360 capsule 1  . oxyCODONE-acetaminophen (PERCOCET/ROXICET) 5-325 MG tablet Take 1 tablet by mouth every 8 (eight) hours as needed for severe pain. 90 tablet 0  . potassium chloride SA (K-DUR,KLOR-CON) 20 MEQ tablet TAKE 3 TABLET BY MOUTH EVERY DAY 270 tablet 3  . tamsulosin (FLOMAX) 0.4 MG CAPS capsule Take 0.4 mg by mouth daily.     Marland Kitchen torsemide (DEMADEX) 20 MG tablet Take 80 mg ( 4 tablets ) twice a day 720 tablet 0  . traZODone (DESYREL) 100 MG tablet Take 1 tablet (100 mg total) by mouth 2 (two) times daily. 180 tablet 1   No current facility-administered medications for this visit.     No Known Allergies  Social History   Social History  . Marital status: Single    Spouse name: N/A  . Number of children: N/A  . Years of education: N/A   Occupational History  . Not on file.   Social History Main Topics  . Smoking status: Former Smoker    Types: Pipe, Landscape architect  . Smokeless tobacco: Never Used     Comment: 06/19/2014 "stopped smoking in ~ 2014; used to smoke a pipe or cigar  a couple times/month"  . Alcohol use No  . Drug use: No  . Sexual activity: No   Other Topics Concern  . Not on file   Social History Narrative  . No narrative on file     Review of Systems: General: negative for chills, fever, night sweats or weight changes.  Cardiovascular: negative for chest pain, dyspnea on exertion, edema, orthopnea, palpitations, paroxysmal nocturnal dyspnea or shortness of breath Dermatological: negative for rash Respiratory: negative for cough or wheezing Urologic: negative for hematuria Abdominal: negative for nausea, vomiting, diarrhea, bright red blood per rectum, melena, or hematemesis Neurologic: negative for visual changes, syncope, or dizziness All other systems reviewed and are otherwise negative except as noted above.    Blood pressure  120/64, pulse (!) 55, height 5\' 9"  (1.753 m), weight 201 lb 12.8 oz (91.5 kg).  General appearance: alert, cooperative, no distress and mildly obese Neck: no JVD Lungs: clear to auscultation bilaterally Heart: regular rate and rhythm Abdomen: soft, non-tender; bowel sounds normal; no masses,  no organomegaly Extremities: trace LE edema Skin: pale, cool, dry Neurologic: Grossly normal  EKG NSR, SB-52, 1st AVB (PR 300), PVCs, RBBB (same as Sept 2016)  ASSESSMENT AND PLAN:   Cardiomyopathy, ischemic Presumably ischemic with abnormal (high risk) Myoview march 2016 EF 25% by echo Jan 2017  Abnormal finding on cardiovascular stress test Abnormal Myoview March 2016- declined coronary angiogram then  Chronic renal disease, stage III Last SCr 1.22 in Jan 0000000  Chronic systolic heart failure (HCC) Intermittent flairs of acute on chronic CHF, last admitted Jan 2017  Dyslipidemia Followed by PCP  Essential hypertension Controlled  BPH (benign prostatic hyperplasia) Followed at [facility] In ED with obstruction 01/17/16   PLAN  Discussed with Dr Percival Spanish- proceed with Rt and Lt heart cath. The patient understands that risks included but are not limited to stroke (1 in 1000), death (1 in 56), kidney failure [usually temporary] (1 in 500), bleeding (1 in 200), allergic reaction [possibly serious] (1 in 200).  The patient understands and agrees to proceed.   Kerin Ransom PA-C 02/05/2016 2:44 PM

## 2016-02-16 NOTE — Progress Notes (Addendum)
Hematoma proximal to tr band/ pressure being held by tech. Dr Jeffie Pollock ordered bp[ meds/ see orders.

## 2016-02-16 NOTE — Interval H&P Note (Signed)
History and Physical Interval Note:  02/16/2016 12:41 PM  Patrick [name]  has presented today for surgery, with the diagnosis of Abnormal Stress Test  The various methods of treatment have been discussed with the patient and family. After consideration of risks, benefits and other options for treatment, the patient has consented to  Right and left heart cath with coronary angiography and possible angioplasty as a surgical intervention .  The patient's history has been reviewed, patient examined, no change in status, stable for surgery.  I have reviewed the patient's chart and labs.  Questions were answered to the patient's satisfaction.     Patrick [name]  Cath Lab Visit (complete for each Cath Lab visit)  Clinical Evaluation Leading to the Procedure:   ACS: No.  Non-ACS:    Anginal Classification: CCS IV  Anti-ischemic medical therapy: Minimal Therapy (1 class of medications)  Non-Invasive Test Results: High-risk stress test findings: cardiac mortality >3%/year  Prior CABG: No previous CABG

## 2016-02-16 NOTE — Discharge Instructions (Signed)
Radial Site Care °Refer to this sheet in the next few weeks. These instructions provide you with information about caring for yourself after your procedure. Your health care provider may also give you more specific instructions. Your treatment has been planned according to current medical practices, but problems sometimes occur. Call your health care provider if you have any problems or questions after your procedure. °WHAT TO EXPECT AFTER THE PROCEDURE °After your procedure, it is typical to have the following: °· Bruising at the radial site that usually fades within 1-2 weeks. °· Blood collecting in the tissue (hematoma) that may be painful to the touch. It should usually decrease in size and tenderness within 1-2 weeks. °HOME CARE INSTRUCTIONS °· Take medicines only as directed by your health care provider. °· You may shower 24-48 hours after the procedure or as directed by your health care provider. Remove the bandage (dressing) and gently wash the site with plain soap and water. Pat the area dry with a clean towel. Do not rub the site, because this may cause bleeding. °· Do not take baths, swim, or use a hot tub until your health care provider approves. °· Check your insertion site every day for redness, swelling, or drainage. °· Do not apply powder or lotion to the site. °· Do not flex or bend the affected arm for 24 hours or as directed by your health care provider. °· Do not push or pull heavy objects with the affected arm for 24 hours or as directed by your health care provider. °· Do not lift over 10 lb (4.5 kg) for 5 days after your procedure or as directed by your health care provider. °· Ask your health care provider when it is okay to: °¨ Return to work or school. °¨ Resume usual physical activities or sports. °¨ Resume sexual activity. °· Do not drive home if you are discharged the same day as the procedure. Have someone else drive you. °· You may drive 24 hours after the procedure unless otherwise  instructed by your health care provider. °· Do not operate machinery or power tools for 24 hours after the procedure. °· If your procedure was done as an outpatient procedure, which means that you went home the same day as your procedure, a responsible adult should be with you for the first 24 hours after you arrive home. °· Keep all follow-up visits as directed by your health care provider. This is important. °SEEK MEDICAL CARE IF: °· You have a fever. °· You have chills. °· You have increased bleeding from the radial site. Hold pressure on the site. CALL 911 °SEEK IMMEDIATE MEDICAL CARE IF: °· You have unusual pain at the radial site. °· You have redness, warmth, or swelling at the radial site. °· You have drainage (other than a small amount of blood on the dressing) from the radial site. °· The radial site is bleeding, and the bleeding does not stop after 30 minutes of holding steady pressure on the site. °· Your arm or hand becomes pale, cool, tingly, or numb. °  °This information is not intended to replace advice given to you by your health care provider. Make sure you discuss any questions you have with your health care provider. °  °Document Released: 06/18/2010 Document Revised: 06/06/2014 Document Reviewed: 12/02/2013 °Elsevier Interactive Patient Education ©2016 [employer] ° °

## 2016-02-17 ENCOUNTER — Encounter (HOSPITAL_COMMUNITY): Payer: Self-pay | Admitting: Internal Medicine

## 2016-02-17 ENCOUNTER — Telehealth: Payer: Self-pay | Admitting: Cardiology

## 2016-02-17 NOTE — Telephone Encounter (Signed)
Recommendations given regarding lifting restrictions, etc. Pt notes he has a sister who requires lifting assistance out of bed, is currently in SNF for wound/infection treatment and will be discharged soon. Advised pt that he will need to discuss her care w SNF provider team and see if options for home including assistive lifting devices, care worker, etc.  Caller voiced understanding & thanks.

## 2016-02-17 NOTE — Telephone Encounter (Signed)
Please call,questions about Cath he had yesterday.

## 2016-02-22 DIAGNOSIS — R338 Other retention of urine: Secondary | ICD-10-CM | POA: Diagnosis not present

## 2016-03-01 ENCOUNTER — Other Ambulatory Visit: Payer: Self-pay | Admitting: Family Medicine

## 2016-03-08 ENCOUNTER — Telehealth: Payer: Self-pay | Admitting: Internal Medicine

## 2016-03-08 ENCOUNTER — Other Ambulatory Visit: Payer: Self-pay | Admitting: Internal Medicine

## 2016-03-08 NOTE — Telephone Encounter (Signed)
Would like to have a RX called in for ibuprofen.Patrick [name]  His feet are hurting a lot right now.  It is the only thing that will help.  [facility] on [address].  Please advise

## 2016-03-08 NOTE — Progress Notes (Signed)
Pt calling to request that a prescription for Ibuprofen be sent to his pharmacy. He states his feet are hurting and this is the only thing that helps. His most recent creatinine is 1.23, GFR 54. He just had a right and left heart cath showing 2 vessel CAD. Do not think Ibuprofen is a great choice for his foot pain. Will call him in the morning to discuss, but he may need to be seen in clinic.  Hyman Bible, MD PGY-2

## 2016-03-09 NOTE — Telephone Encounter (Signed)
Left a message on patient's voicemail stating that I do not think Ibuprofen would be a good choice for his foot pain, due to his CAD and CKD III. I recommended that he make a same day appointment to be seen for his foot pain, so that we can figure out the etiology and treat him appropriately.  Hyman Bible, MD

## 2016-03-09 NOTE — Telephone Encounter (Signed)
Left message for patient on voicemail recommending that he make a same day appointment to be seen for his foot pain. Would not recommend Ibuprofen given his CAD and CKD.

## 2016-03-17 ENCOUNTER — Encounter: Payer: Self-pay | Admitting: Cardiology

## 2016-03-21 ENCOUNTER — Other Ambulatory Visit: Payer: Self-pay | Admitting: General Surgery

## 2016-03-21 DIAGNOSIS — K629 Disease of anus and rectum, unspecified: Secondary | ICD-10-CM | POA: Diagnosis not present

## 2016-03-23 ENCOUNTER — Other Ambulatory Visit: Payer: Self-pay | Admitting: Internal Medicine

## 2016-03-23 ENCOUNTER — Ambulatory Visit: Payer: Medicare Other | Admitting: Podiatry

## 2016-03-23 ENCOUNTER — Telehealth: Payer: Self-pay | Admitting: Internal Medicine

## 2016-03-23 NOTE — Telephone Encounter (Signed)
Pt is having a colonoscopy on November 3rd. Pt would like to know if he is supposed to quit taking the low Asprin prior to procedure? If so when do you want pt to stop and when do you want pt to resume? Please advise. Thanks! ep

## 2016-03-23 NOTE — Telephone Encounter (Signed)
Will forward to MD to advise. Jazmin Hartsell,CMA  

## 2016-03-30 ENCOUNTER — Encounter: Payer: Self-pay | Admitting: Podiatry

## 2016-03-30 ENCOUNTER — Ambulatory Visit (INDEPENDENT_AMBULATORY_CARE_PROVIDER_SITE_OTHER): Payer: Medicare Other | Admitting: Podiatry

## 2016-03-30 ENCOUNTER — Encounter: Payer: Self-pay | Admitting: Cardiology

## 2016-03-30 VITALS — BP 142/80 | HR 64 | Resp 18

## 2016-03-30 DIAGNOSIS — M79676 Pain in unspecified toe(s): Secondary | ICD-10-CM | POA: Diagnosis not present

## 2016-03-30 DIAGNOSIS — B351 Tinea unguium: Secondary | ICD-10-CM | POA: Diagnosis not present

## 2016-03-30 NOTE — Patient Instructions (Signed)
Diabetes and Foot Care Diabetes may cause you to have problems because of poor blood supply (circulation) to your feet and legs. This may cause the skin on your feet to become thinner, break easier, and heal more slowly. Your skin may become dry, and the skin may peel and crack. You may also have nerve damage in your legs and feet causing decreased feeling in them. You may not notice minor injuries to your feet that could lead to infections or more serious problems. Taking care of your feet is one of the most important things you can do for yourself.  HOME CARE INSTRUCTIONS  Wear shoes at all times, even in the house. Do not go barefoot. Bare feet are easily injured.  Check your feet daily for blisters, cuts, and redness. If you cannot see the bottom of your feet, use a mirror or ask someone for help.  Wash your feet with warm water (do not use hot water) and mild soap. Then pat your feet and the areas between your toes until they are completely dry. Do not soak your feet as this can dry your skin.  Apply a moisturizing lotion or petroleum jelly (that does not contain alcohol and is unscented) to the skin on your feet and to dry, brittle toenails. Do not apply lotion between your toes.  Trim your toenails straight across. Do not dig under them or around the cuticle. File the edges of your nails with an emery board or nail file.  Do not cut corns or calluses or try to remove them with medicine.  Wear clean socks or stockings every day. Make sure they are not too tight. Do not wear knee-high stockings since they may decrease blood flow to your legs.  Wear shoes that fit properly and have enough cushioning. To break in new shoes, wear them for just a few hours a day. This prevents you from injuring your feet. Always look in your shoes before you put them on to be sure there are no objects inside.  Do not cross your legs. This may decrease the blood flow to your feet.  If you find a minor scrape,  cut, or break in the skin on your feet, keep it and the skin around it clean and dry. These areas may be cleansed with mild soap and water. Do not cleanse the area with peroxide, alcohol, or iodine.  When you remove an adhesive bandage, be sure not to damage the skin around it.  If you have a wound, look at it several times a day to make sure it is healing.  Do not use heating pads or hot water bottles. They may burn your skin. If you have lost feeling in your feet or legs, you may not know it is happening until it is too late.  Make sure your health care provider performs a complete foot exam at least annually or more often if you have foot problems. Report any cuts, sores, or bruises to your health care provider immediately. SEEK MEDICAL CARE IF:   You have an injury that is not healing.  You have cuts or breaks in the skin.  You have an ingrown nail.  You notice redness on your legs or feet.  You feel burning or tingling in your legs or feet.  You have pain or cramps in your legs and feet.  Your legs or feet are numb.  Your feet always feel cold. SEEK IMMEDIATE MEDICAL CARE IF:   There is increasing redness,   swelling, or pain in or around a wound.  There is a red line that goes up your leg.  Pus is coming from a wound.  You develop a fever or as directed by your health care provider.  You notice a bad smell coming from an ulcer or wound.   This information is not intended to replace advice given to you by your health care provider. Make sure you discuss any questions you have with your health care provider.   Document Released: 05/13/2000 Document Revised: 01/16/2013 Document Reviewed: 10/23/2012 Elsevier Interactive Patient Education 2016 Elsevier Inc.  

## 2016-03-30 NOTE — Progress Notes (Signed)
HPI The patient presents for follow-up after cardiac cath.  He had subtotal occlusion of the small codominant right coronary. He had a borderline lesion in a moderate size first marginal. He had a severe cardiomyopathy with an ejection fraction of 20%. He did have mild to moderate pulmonary venous hypertension. Normal cardiac output. I reviewed these records.   The patient denies any new symptoms such as chest discomfort, neck or arm discomfort. There has been no new shortness of breath, PND or orthopnea. There have been no reported palpitations, presyncope or syncope.  Unfortunately he might have a rectal cancer and actually is going to have a colonoscopy tomorrow.   No Known Allergies  Current Outpatient Prescriptions  Medication Sig Dispense Refill  . acetaminophen (TYLENOL) 500 MG tablet Take 1,000 mg by mouth every 6 (six) hours as needed for mild pain.    . Ascorbic Acid (VITAMIN C) 1000 MG tablet Take 2,000 mg by mouth 2 (two) times daily.    Marland Kitchen aspirin EC 81 MG EC tablet Take 1 tablet (81 mg total) by mouth daily. 30 tablet 0  . carvedilol (COREG) 25 MG tablet Take 1 tablet (25 mg total) by mouth 2 (two) times daily. 180 tablet 3  . ferrous sulfate 325 (65 FE) MG tablet take 1 tablet by mouth once daily WITH BREAKFAST 30 tablet 3  . finasteride (PROSCAR) 5 MG tablet Take 1 tablet (5 mg total) by mouth daily. 90 tablet 1  . losartan (COZAAR) 100 MG tablet Take 1 tablet (100 mg total) by mouth daily. 30 tablet 11  . lovastatin (MEVACOR) 20 MG tablet Take 1 tablet (20 mg total) by mouth at bedtime. 90 tablet 1  . mirtazapine (REMERON SOL-TAB) 15 MG disintegrating tablet place 1 tablet ON TONGUE at bedtime (Patient taking differently: Take 15 mg by mouth at bedtime. place 1 tablet ON TONGUE at bedtime) 90 tablet 0  . Multiple Vitamins-Minerals (MULTIVITAMIN WITH MINERALS) tablet Take 1 tablet by mouth daily.    Marland Kitchen omega-3 acid ethyl esters (LOVAZA) 1 g capsule take 2 capsules by mouth twice  a day 360 capsule 1  . oxyCODONE-acetaminophen (PERCOCET/ROXICET) 5-325 MG tablet Take 1 tablet by mouth every 8 (eight) hours as needed for severe pain. 90 tablet 0  . potassium chloride SA (K-DUR,KLOR-CON) 20 MEQ tablet TAKE 3 TABLET BY MOUTH EVERY DAY (Patient taking differently: Take 20 mEq by mouth 3 (three) times daily. ) 270 tablet 3  . tamsulosin (FLOMAX) 0.4 MG CAPS capsule Take 0.4 mg by mouth daily.     Marland Kitchen torsemide (DEMADEX) 20 MG tablet take 4 tablets by mouth twice a day 720 tablet 0  . traZODone (DESYREL) 100 MG tablet Take 1 tablet (100 mg total) by mouth 2 (two) times daily. 180 tablet 1   No current facility-administered medications for this visit.     Past Medical History:  Diagnosis Date  . Arthritis    "right hand; right knee" (06/19/2014)  . CAD (coronary artery disease)    2v CAD with subtotal occlusion of small co-dominant RCA and borderline lesion in moderate-sized OM-1  . CHF (congestive heart failure) (HCC)    Preserved EF  . Degenerative joint disease of knee, right   . DM II (diabetes mellitus, type II), controlled ([place])    Diet controlled.   . Enlarged prostate   . Hyperlipidemia   . Hypertension   . Pneumonia 05/2014  . Prediabetes 10/03/2014  . Scrotal edema 04/03/2015  . SMALL BOWEL OBSTRUCTION,  HX OF 08/21/2007   Annotation: with narrowing in the ileocecal region Qualifier: Diagnosis of  By: Hassell Done FNP, Tori Milks      Past Surgical History:  Procedure Laterality Date  . CARDIAC CATHETERIZATION N/A 02/16/2016   Procedure: Right/Left Heart Cath and Coronary Angiography;  Surgeon: Jolaine Artist, MD;  Location: [facility];  Service: Cardiovascular;  Laterality: N/A;  . INGUINAL HERNIA REPAIR Left 1990's    ROS:   As stated in the HPI and negative for all other systems.  PHYSICAL EXAM BP (!) 142/76   Pulse 71   Ht 5\' 9"  (1.753 m)   Wt 205 lb (93 kg)   BMI 30.27 kg/m  GENERAL:  Well appearing HEENT:  Pupils equal round and reactive,  fundi not visualized, oral mucosa unremarkable, poor dentition.  NECK:  Positive jugular venous distention to the jaw at 45 degrees, waveform within normal limits, carotid upstroke brisk and symmetric, no bruits, no thyromegaly LUNGS:  Clear to auscultation bilaterally BACK:  No CVA tenderness CHEST:  Unremarkable HEART:  PMI not displaced or sustained,S1 within normal limits, fixed split S2, no S3, no S4, no clicks, no rubs, no murmurs ABD:  Flat, positive bowel sounds normal in frequency in pitch, no bruits, no rebound, no guarding, no midline pulsatile mass, no hepatomegaly, no splenomegaly EXT:  2 plus pulses throughout, moderate edema bilateral legs, no cyanosis no clubbing   ASSESSMENT AND PLAN  CAD:   We will continue to manage this medically.    Mild renal insufficiency/hypokalemia:  I will repeat  BMET in two weeks.  CM:  Probably predominantly nonischemic. I'm going to increase his Cozaar to 100 mg daily.  Basic metabolic profile in 2 weeks. I like to have him come back in a couple of weeks for med titration.  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    Hospital records from cath reviewed.

## 2016-03-30 NOTE — Progress Notes (Signed)
Patient ID: Patrick [name], male   DOB: September 16, 1950, 64 y.o.   MRN: [id number]    Subjective: This patient presents complaining of painful toenails and walking wearing shoes and request nail debridement. Patient also requesting diabetic shoes. He states that he is never taking any medication for diabetes and has been labeled as a prediabetic  Objective: Orientated 3 DP and PT pulses 2/4 bilaterally Capillary reflex immediate bilaterally Sensation to 10 g monofilament wire intact 5/5 bilaterally Vibratory sensation reactive bilaterally Ankle reflex equal reactive bilaterally Hammertoe second bilaterally HAV bilaterally No open skin lesions bilaterally Pitting edema bilaterally The toenails are elongated, brittle, incurvated, deformed and tender to direct palpation 6-10  Assessment: Symptomatic onychomycoses 6-10  Plan: Debridement toenails 10 mechanically electronically without any bleeding Patient advised that unless he is diagnosed as a diabetic he would not qualify for diabetic shoes  Reappoint 3 months

## 2016-03-31 ENCOUNTER — Ambulatory Visit (INDEPENDENT_AMBULATORY_CARE_PROVIDER_SITE_OTHER): Payer: Medicare Other | Admitting: Cardiology

## 2016-03-31 ENCOUNTER — Encounter: Payer: Self-pay | Admitting: Cardiology

## 2016-03-31 VITALS — BP 142/76 | HR 71 | Ht 69.0 in | Wt 205.0 lb

## 2016-03-31 DIAGNOSIS — I428 Other cardiomyopathies: Secondary | ICD-10-CM | POA: Diagnosis not present

## 2016-03-31 DIAGNOSIS — Z79899 Other long term (current) drug therapy: Secondary | ICD-10-CM | POA: Diagnosis not present

## 2016-03-31 MED ORDER — LOSARTAN POTASSIUM 100 MG PO TABS: 100.0000 mg | ORAL_TABLET | Freq: Every day | ORAL | 11 refills | Status: DC

## 2016-03-31 NOTE — Patient Instructions (Signed)
Medication Instructions:  INCREASE- Losartan 100 mg daily  Labwork: BMP in 2 weeks  Testing/Procedures: None Ordered  Follow-Up: Your physician recommends that you schedule a follow-up appointment in: 4 weeks with APP   Any Other Special Instructions Will Be Listed Below (If Applicable).     If you need a refill on your cardiac medications before your next appointment, please call your pharmacy.

## 2016-04-01 ENCOUNTER — Other Ambulatory Visit: Payer: Self-pay | Admitting: General Surgery

## 2016-04-01 ENCOUNTER — Encounter (HOSPITAL_COMMUNITY)
Admission: RE | Disposition: A | Discharge status: Home / Self Care | Payer: Self-pay | Source: Ambulatory Visit | Attending: General Surgery

## 2016-04-01 ENCOUNTER — Ambulatory Visit (HOSPITAL_COMMUNITY)
Admission: RE | Admit: 2016-04-01 | Discharge: 2016-04-01 | Disposition: A | Discharge status: Home / Self Care | Payer: Medicare Other | Source: Ambulatory Visit | Attending: General Surgery | Admitting: General Surgery

## 2016-04-01 DIAGNOSIS — K6289 Other specified diseases of anus and rectum: Secondary | ICD-10-CM

## 2016-04-01 DIAGNOSIS — Z79899 Other long term (current) drug therapy: Secondary | ICD-10-CM | POA: Diagnosis not present

## 2016-04-01 DIAGNOSIS — F419 Anxiety disorder, unspecified: Secondary | ICD-10-CM | POA: Diagnosis not present

## 2016-04-01 DIAGNOSIS — I110 Hypertensive heart disease with heart failure: Secondary | ICD-10-CM | POA: Insufficient documentation

## 2016-04-01 DIAGNOSIS — K629 Disease of anus and rectum, unspecified: Secondary | ICD-10-CM | POA: Diagnosis present

## 2016-04-01 DIAGNOSIS — N400 Enlarged prostate without lower urinary tract symptoms: Secondary | ICD-10-CM | POA: Insufficient documentation

## 2016-04-01 DIAGNOSIS — E7800 Pure hypercholesterolemia, unspecified: Secondary | ICD-10-CM | POA: Insufficient documentation

## 2016-04-01 DIAGNOSIS — I509 Heart failure, unspecified: Secondary | ICD-10-CM | POA: Diagnosis not present

## 2016-04-01 DIAGNOSIS — F329 Major depressive disorder, single episode, unspecified: Secondary | ICD-10-CM | POA: Diagnosis not present

## 2016-04-01 DIAGNOSIS — Z7982 Long term (current) use of aspirin: Secondary | ICD-10-CM | POA: Insufficient documentation

## 2016-04-01 DIAGNOSIS — K5730 Diverticulosis of large intestine without perforation or abscess without bleeding: Secondary | ICD-10-CM | POA: Diagnosis not present

## 2016-04-01 DIAGNOSIS — E119 Type 2 diabetes mellitus without complications: Secondary | ICD-10-CM | POA: Insufficient documentation

## 2016-04-01 DIAGNOSIS — C20 Malignant neoplasm of rectum: Secondary | ICD-10-CM | POA: Diagnosis not present

## 2016-04-01 HISTORY — PX: COLONOSCOPY: SHX5424

## 2016-04-01 SURGERY — COLONOSCOPY
Anesthesia: Moderate Sedation

## 2016-04-01 MED ORDER — MIDAZOLAM HCL 5 MG/ML IJ SOLN
INTRAMUSCULAR | Status: AC
  Filled 2016-04-01: qty 2

## 2016-04-01 MED ORDER — LACTATED RINGERS IV SOLN
INTRAVENOUS | Status: DC
  Administered 2016-04-01: 1000 mL via INTRAVENOUS

## 2016-04-01 MED ORDER — FENTANYL CITRATE (PF) 100 MCG/2ML IJ SOLN
INTRAMUSCULAR | Status: AC
  Filled 2016-04-01: qty 2

## 2016-04-01 MED ORDER — MIDAZOLAM HCL 10 MG/2ML IJ SOLN
INTRAMUSCULAR | Status: DC | PRN
  Administered 2016-04-01 (×2): 2 mg via INTRAVENOUS
  Administered 2016-04-01 (×2): 1 mg via INTRAVENOUS

## 2016-04-01 MED ORDER — SODIUM CHLORIDE 0.9 % IV SOLN: INTRAVENOUS | Status: DC

## 2016-04-01 MED ORDER — FENTANYL CITRATE (PF) 100 MCG/2ML IJ SOLN
INTRAMUSCULAR | Status: DC | PRN
  Administered 2016-04-01 (×3): 25 ug via INTRAVENOUS

## 2016-04-01 NOTE — H&P (Signed)
History of Present Illness Leighton Ruff MD; 0000000 3:18 PM) Patient words: rectal mass.  The patient is a 65 year old male who presents with a complaint of Rectal bleeding. 65 year old male who presents to the office as a referral due to rectal mass palpated during his prostate exam by his urologist. The patient states that he does have rectal bleeding occasionally. He has been worse over the last few months. He has noticed some changes in his stool with them being more loose recently. He has had some weight loss recently but this was delivered. He has never had any abdominal surgery before except for an inguinal hernia repair. He denies any abdominal pain.   Other Problems Mammie Lorenzo, LPN; QA348G 624THL PM) Anxiety Disorder Arthritis Back Pain Congestive Heart Failure Depression Diabetes Mellitus Enlarged Prostate Hemorrhoids High blood pressure Hypercholesterolemia  Diagnostic Studies History Mammie Lorenzo, LPN; QA348G 624THL PM) Colonoscopy never  Allergies Mammie Lorenzo, LPN; QA348G 075-GRM PM) No Known Allergies10/23/2017  Medication History Mammie Lorenzo, LPN; QA348G [id number] PM) Carvedilol (25MG  Tablet, Oral) Active. Ferrous Sulfate (325 (65 Fe)MG Tablet, Oral) Active. Finasteride (5MG  Tablet, Oral) Active. Losartan Potassium (50MG  Tablet, Oral) Active. Lovastatin (20MG  Tablet, Oral) Active. Mirtazapine (15MG  Tablet Disint, Oral) Active. Omega-3-acid Ethyl Esters (1GM Capsule, Oral) Active. Potassium Chloride Crys ER (20MEQ Tablet ER, Oral) Active. Tamsulosin HCl (0.4MG  Capsule, Oral) Active. Torsemide (20MG  Tablet, Oral) Active. TraZODone HCl (100MG  Tablet, Oral) Active. Vitamin C (1000MG  Tablet, Oral) Active. Aspirin (81MG  Tablet DR, Oral) Active. Multivitamin Adult (Oral) Active. Oxycodone-Acetaminophen (5-325MG  Tablet, Oral) Active.  Social History Mammie Lorenzo, LPN; QA348G 624THL PM) Caffeine use  Carbonated beverages. No alcohol use No drug use Tobacco use Never smoker.  Family History Mammie Lorenzo, LPN; QA348G 624THL PM) Arthritis Mother, Sister. Depression Mother, Sister. Diabetes Mellitus Mother, Sister. Hypertension Mother, Sister. Migraine Headache Mother, Sister.    Review of Systems Claiborne Billings [facility] LPN; QA348G 624THL PM) General Present- Fatigue. Not Present- Appetite Loss, Chills, Fever, Night Sweats, Weight Gain and Weight Loss. Skin Not Present- Change in Wart/Mole, Dryness, Hives, Jaundice, New Lesions, Non-Healing Wounds, Rash and Ulcer. HEENT Not Present- Earache, Hearing Loss, Hoarseness, Nose Bleed, Oral Ulcers, Ringing in the Ears, Seasonal Allergies, Sinus Pain, Sore Throat, Visual Disturbances, Wears glasses/contact lenses and Yellow Eyes. Respiratory Not Present- Bloody sputum, Chronic Cough, Difficulty Breathing, Snoring and Wheezing. Breast Not Present- Breast Mass, Breast Pain, Nipple Discharge and Skin Changes. Cardiovascular Present- Difficulty Breathing Lying Down, Shortness of Breath and Swelling of Extremities. Not Present- Chest Pain, Leg Cramps, Palpitations and Rapid Heart Rate. Gastrointestinal Present- Bloody Stool and Hemorrhoids. Not Present- Abdominal Pain, Bloating, Change in Bowel Habits, Chronic diarrhea, Constipation, Difficulty Swallowing, Excessive gas, Gets full quickly at meals, Indigestion, Nausea, Rectal Pain and Vomiting. Male Genitourinary Not Present- Blood in Urine, Change in Urinary Stream, Frequency, Impotence, Nocturia, Painful Urination, Urgency and Urine Leakage. Musculoskeletal Present- Joint Pain and Joint Stiffness. Not Present- Back Pain, Muscle Pain, Muscle Weakness and Swelling of Extremities. Neurological Present- Weakness. Not Present- Decreased Memory, Fainting, Headaches, Numbness, Seizures, Tingling, Tremor and Trouble walking. Psychiatric Present- Anxiety and Depression. Not Present- Bipolar, Change in  Sleep Pattern, Fearful and Frequent crying. Endocrine Present- Cold Intolerance. Not Present- Excessive Hunger, Hair Changes, Heat Intolerance, Hot flashes and New Diabetes. Hematology Present- Easy Bruising. Not Present- Blood Thinners, Excessive bleeding, Gland problems, HIV and Persistent Infections.  BP (!) 163/75   Pulse 63   Temp 97.8 F (36.6 C) (Oral)   Resp 12   SpO2 97%  Physical Exam Leighton Ruff MD; 0000000 3:22 PM) General Mental Status-Alert. General Appearance-Not in acute distress. Build & Nutrition-Well nourished. Posture-Normal posture. Gait-Normal.  Head and Neck Head-normocephalic, atraumatic with no lesions or palpable masses. Trachea-midline.  Chest and Lung Exam Chest and lung exam reveals -on auscultation, normal breath sounds, no adventitious sounds and normal vocal resonance.  Cardiovascular Cardiovascular examination reveals -normal heart sounds, regular rate and rhythm with no murmurs.  Abdomen Inspection Inspection of the abdomen reveals - No Hernias. Palpation/Percussion Palpation and Percussion of the abdomen reveal - Soft, Non Tender, No Rigidity (guarding), No hepatosplenomegaly and No Palpable abdominal masses.  Rectal Note: Mass palpated anteriorly on digital rectal exam.   Neurologic Neurologic evaluation reveals -alert and oriented x 3 with no impairment of recent or remote memory, normal attention span and ability to concentrate, normal sensation and normal coordination.  Musculoskeletal Normal Exam - Bilateral-Upper Extremity Strength Normal and Lower Extremity Strength Normal.    Assessment & Plan Leighton Ruff MD; 0000000 3:23 PM) RECTAL MASS (K62.9) Impression: 65 year old male who presents to the office with a rectal mass found by his urologist. This appears to be anterior and distal in nature. It is most certainly a cancer. I have recommended a colonoscopy to start his workup.  Risks  include bleeding, perforation, failure to complete exam or missed pathology.  I believe he understands this and has agreed to proceed.

## 2016-04-01 NOTE — Op Note (Signed)
[facility] Patient Name: Patrick [name] Procedure Date: 04/01/2016 MRN: [id number] Attending MD: Leighton Ruff , MD Date of Birth: [birth year] CSN: [id number] Age: 65 Admit Type: Outpatient Procedure:                Colonoscopy Indications:              Incidental - Evaluation of unexplained GI bleeding,                            Screening for colorectal malignant neoplasm,                            Incidental - Rectal mass Providers:                Leighton Ruff, MD, Cleda Daub, RN, Corliss Parish, Technician Referring MD:              Medicines:                Fentanyl 75 micrograms IV, Midazolam 6 mg IV Complications:            No immediate complications. Estimated blood loss:                            Minimal. Estimated Blood Loss:     Estimated blood loss: 15 mL. Procedure:                Pre-Anesthesia Assessment:                           - Prior to the procedure, a History and Physical                            was performed, and patient medications and                            allergies were reviewed. The patient's tolerance of                            previous anesthesia was also reviewed. The risks                            and benefits of the procedure and the sedation                            options and risks were discussed with the patient.                            All questions were answered, and informed consent                            was obtained. Prior Anticoagulants: The patient has                            taken  no previous anticoagulant or antiplatelet                            agents. ASA Grade Assessment: III - A patient with                            severe systemic disease. After reviewing the risks                            and benefits, the patient was deemed in                            satisfactory condition to undergo the procedure.                           - The risks and benefits of the  procedure and the                            sedation options and risks were discussed with the                            patient. All questions were answered and informed                            consent was obtained.                           - Patient identification and proposed procedure                            were verified prior to the procedure by the                            physician, the nurse and the technician. The                            procedure was verified in the procedure room.                           - The anesthesia plan was to use moderate                            sedation/analgesia (conscious sedation).                           - Sedation was administered by an endoscopy nurse.                            The sedation level attained was moderate.                           - The heart rate, respiratory rate, oxygen  saturations, blood pressure, adequacy of pulmonary                            ventilation, and response to care were monitored                            throughout the procedure.                           After obtaining informed consent, the colonoscope                            was passed under direct vision. Throughout the                            procedure, the patient's blood pressure, pulse, and                            oxygen saturations were monitored continuously. The                            [id number]) scope was introduced through                            the anus and advanced to the the cecum, identified                            by appendiceal orifice and ileocecal valve. The                            colonoscopy was performed without difficulty. The                            patient tolerated the procedure well. The quality                            of the bowel preparation was adequate to identify                            polyps. Scope withdrawal time was 21 minutes.                             During the procedure the patient had to be                            repositioned. The ileocecal valve was photographed. Scope In: 1:28:59 PM Scope Out: 2:00:39 PM Scope Withdrawal Time: 0 hours 20 minutes 49 seconds  Total Procedure Duration: 0 hours 31 minutes 40 seconds  Findings:      The digital rectal exam revealed a firm rectal mass.      Many small and large-mouthed diverticula were found in the sigmoid colon.      A fungating non-obstructing medium-sized mass was found in the distal       rectum. The mass was non-circumferential. The mass measured three  cm in       length. No bleeding was present. Biopsies were taken with a cold forceps       for histology. Impression:               - Diverticulosis in the sigmoid colon.                           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 Biopsied. Moderate Sedation:      Moderate (conscious) sedation was administered by the endoscopy nurse       and supervised by the endoscopist. The following parameters were       monitored: oxygen saturation, heart rate, blood pressure, and response       to care. Recommendation:           - Discharge patient to home (ambulatory).                           - Written discharge instructions were provided to                            the patient.                           - Perform a CT scan (computed tomography) of chest                            with contrast, abdomen with contrast and pelvis                            with contrast. The office will call you to set this                            up.                           - Return to my office after studies are complete.                           - Resume previous diet - advance as tolerated [Diet                            Recommendation] [Duration].                           - Continue present medications.                           - Return to normal activities tomorrow.                           - Repeat colonoscopy in  1 year for surveillance. Procedure Code(s):        --- Professional ---                           [phone], Colonoscopy, flexible; with biopsy, single  or multiple Diagnosis Code(s):        --- Professional ---                           Z12.11, Encounter for screening for malignant                            neoplasm of colon                           K62.89, Other specified diseases of anus and rectum                           C20, Malignant neoplasm of rectum                           K57.30, Diverticulosis of large intestine without                            perforation or abscess without bleeding CPT copyright 2016 [employer]. All rights reserved. The codes documented in this report are preliminary and upon coder review may  be revised to meet current compliance requirements. Leighton Ruff, MD Leighton Ruff, MD 0000000 2:20:51 PM This report has been signed electronically. Number of Addenda: 0

## 2016-04-01 NOTE — Discharge Instructions (Signed)
Post Colonoscopy Instructions ° °1. DIET: Follow a light bland diet the first 24 hours after arrival home, such as soup, liquids, crackers, etc.  Be sure to include lots of fluids daily.  Avoid fast food or heavy meals as your are more likely to get nauseated.   °2. You may have some mild rectal bleeding for the first few days after the procedure.  This should get less and less with time.  Resume any blood thinners 2 days after your procedure unless directed otherwise by your physician. °3. Take your usually prescribed home medications unless otherwise directed. °a. If you have any pain, it is helpful to get up and walk around, as it is usually from excess gas. °b. If this is not helpful, you can take an over-the-counter pain medication.  Choose one of the following that works best for you: °i. Naproxen (Aleve, etc)  Two 220mg tabs twice a day °ii. Ibuprofen (Advil, etc) Three 200mg tabs four times a day (every meal & bedtime) °iii. If you still have pain after using one of these, please call the office °4. It is normal to not have a bowel movement for 2-3 days after colonoscopy.   ° °5. ACTIVITIES as tolerated:   °6. You may resume regular (light) daily activities beginning the next day--such as daily self-care, walking, climbing stairs--gradually increasing activities as tolerated.  ° ° °WHEN TO CALL US [phone]: °1. Fever over 101.5 F (38.5 C)  °2. Severe abdominal or chest pain  °3. Large amount of rectal bleeding, passing multiple blood clots  °4. Dizziness or shortness of breath °5. Increasing nausea or vomiting ° ° The clinic staff is available to answer your questions during regular business hours (8:30am-5pm).  Please Toris’t hesitate to call and ask to speak to one of our nurses for clinical concerns.  ° If you have a medical emergency, go to the nearest emergency room or call 911. ° A surgeon from [facility] is always on call at the hospitals ° ° °[facility] °[address], [address], , [place]  [address] ? °MAIN: [phone] ? TOLL FREE: [phone] ?  °FAX [phone] °[email or web address] ° ° °

## 2016-04-04 ENCOUNTER — Ambulatory Visit
Admission: RE | Admit: 2016-04-04 | Discharge: 2016-04-04 | Disposition: A | Discharge status: Home / Self Care | Payer: Medicaid Other | Source: Ambulatory Visit | Attending: General Surgery | Admitting: General Surgery

## 2016-04-04 ENCOUNTER — Encounter (HOSPITAL_COMMUNITY): Payer: Self-pay | Admitting: General Surgery

## 2016-04-04 DIAGNOSIS — K6289 Other specified diseases of anus and rectum: Secondary | ICD-10-CM

## 2016-04-04 DIAGNOSIS — C20 Malignant neoplasm of rectum: Secondary | ICD-10-CM | POA: Diagnosis not present

## 2016-04-04 MED ORDER — IOPAMIDOL (ISOVUE-300) INJECTION 61%
125.0000 mL | Freq: Once | INTRAVENOUS | Status: AC | PRN
  Administered 2016-04-04: 125 mL via INTRAVENOUS

## 2016-04-07 ENCOUNTER — Other Ambulatory Visit: Payer: Self-pay | Admitting: General Surgery

## 2016-04-07 DIAGNOSIS — C801 Malignant (primary) neoplasm, unspecified: Secondary | ICD-10-CM

## 2016-04-14 DIAGNOSIS — C20 Malignant neoplasm of rectum: Secondary | ICD-10-CM | POA: Insufficient documentation

## 2016-04-15 ENCOUNTER — Ambulatory Visit (HOSPITAL_BASED_OUTPATIENT_CLINIC_OR_DEPARTMENT_OTHER): Payer: Medicare Other | Admitting: Hematology

## 2016-04-15 ENCOUNTER — Encounter: Payer: Self-pay | Admitting: *Deleted

## 2016-04-15 ENCOUNTER — Ambulatory Visit (HOSPITAL_BASED_OUTPATIENT_CLINIC_OR_DEPARTMENT_OTHER): Payer: Medicare Other

## 2016-04-15 ENCOUNTER — Encounter: Payer: Self-pay | Admitting: Hematology

## 2016-04-15 ENCOUNTER — Ambulatory Visit: Payer: Medicaid Other | Admitting: Nutrition

## 2016-04-15 ENCOUNTER — Ambulatory Visit
Admission: RE | Admit: 2016-04-15 | Discharge: 2016-04-15 | Disposition: A | Discharge status: Home / Self Care | Payer: Medicare Other | Source: Ambulatory Visit | Attending: Radiation Oncology | Admitting: Radiation Oncology

## 2016-04-15 ENCOUNTER — Encounter: Payer: Self-pay | Admitting: Physical Therapy

## 2016-04-15 ENCOUNTER — Ambulatory Visit: Payer: Medicare Other | Attending: Hematology | Admitting: Physical Therapy

## 2016-04-15 VITALS — BP 148/79 | HR 66 | Temp 97.7°F | Resp 18 | Ht 69.0 in | Wt 207.9 lb

## 2016-04-15 DIAGNOSIS — D509 Iron deficiency anemia, unspecified: Secondary | ICD-10-CM

## 2016-04-15 DIAGNOSIS — I2510 Atherosclerotic heart disease of native coronary artery without angina pectoris: Secondary | ICD-10-CM | POA: Insufficient documentation

## 2016-04-15 DIAGNOSIS — C20 Malignant neoplasm of rectum: Secondary | ICD-10-CM

## 2016-04-15 DIAGNOSIS — Z7982 Long term (current) use of aspirin: Secondary | ICD-10-CM | POA: Insufficient documentation

## 2016-04-15 DIAGNOSIS — K769 Liver disease, unspecified: Secondary | ICD-10-CM

## 2016-04-15 DIAGNOSIS — I10 Essential (primary) hypertension: Secondary | ICD-10-CM

## 2016-04-15 DIAGNOSIS — Z823 Family history of stroke: Secondary | ICD-10-CM | POA: Insufficient documentation

## 2016-04-15 DIAGNOSIS — Z483 Aftercare following surgery for neoplasm: Secondary | ICD-10-CM | POA: Diagnosis not present

## 2016-04-15 DIAGNOSIS — I509 Heart failure, unspecified: Secondary | ICD-10-CM | POA: Insufficient documentation

## 2016-04-15 DIAGNOSIS — Z825 Family history of asthma and other chronic lower respiratory diseases: Secondary | ICD-10-CM | POA: Insufficient documentation

## 2016-04-15 DIAGNOSIS — M6281 Muscle weakness (generalized): Secondary | ICD-10-CM

## 2016-04-15 DIAGNOSIS — E785 Hyperlipidemia, unspecified: Secondary | ICD-10-CM | POA: Insufficient documentation

## 2016-04-15 DIAGNOSIS — Z8042 Family history of malignant neoplasm of prostate: Secondary | ICD-10-CM

## 2016-04-15 DIAGNOSIS — Z87891 Personal history of nicotine dependence: Secondary | ICD-10-CM | POA: Insufficient documentation

## 2016-04-15 DIAGNOSIS — I110 Hypertensive heart disease with heart failure: Secondary | ICD-10-CM | POA: Insufficient documentation

## 2016-04-15 DIAGNOSIS — I5022 Chronic systolic (congestive) heart failure: Secondary | ICD-10-CM

## 2016-04-15 DIAGNOSIS — N400 Enlarged prostate without lower urinary tract symptoms: Secondary | ICD-10-CM | POA: Insufficient documentation

## 2016-04-15 DIAGNOSIS — D500 Iron deficiency anemia secondary to blood loss (chronic): Secondary | ICD-10-CM | POA: Insufficient documentation

## 2016-04-15 DIAGNOSIS — Z79899 Other long term (current) drug therapy: Secondary | ICD-10-CM | POA: Insufficient documentation

## 2016-04-15 DIAGNOSIS — Z833 Family history of diabetes mellitus: Secondary | ICD-10-CM | POA: Insufficient documentation

## 2016-04-15 DIAGNOSIS — Z809 Family history of malignant neoplasm, unspecified: Secondary | ICD-10-CM | POA: Insufficient documentation

## 2016-04-15 DIAGNOSIS — Z8249 Family history of ischemic heart disease and other diseases of the circulatory system: Secondary | ICD-10-CM | POA: Insufficient documentation

## 2016-04-15 DIAGNOSIS — Z510 Encounter for antineoplastic radiation therapy: Secondary | ICD-10-CM | POA: Insufficient documentation

## 2016-04-15 DIAGNOSIS — K5730 Diverticulosis of large intestine without perforation or abscess without bleeding: Secondary | ICD-10-CM | POA: Insufficient documentation

## 2016-04-15 DIAGNOSIS — M1990 Unspecified osteoarthritis, unspecified site: Secondary | ICD-10-CM | POA: Insufficient documentation

## 2016-04-15 DIAGNOSIS — R7303 Prediabetes: Secondary | ICD-10-CM | POA: Insufficient documentation

## 2016-04-15 LAB — COMPREHENSIVE METABOLIC PANEL
ALT: 21 U/L (ref 0–55)
ANION GAP: 10 meq/L (ref 3–11)
AST: 23 U/L (ref 5–34)
Albumin: 3.7 g/dL (ref 3.5–5.0)
Alkaline Phosphatase: 98 U/L (ref 40–150)
BILIRUBIN TOTAL: 0.9 mg/dL (ref 0.20–1.20)
BUN: 10.6 mg/dL (ref 7.0–26.0)
CALCIUM: 9.2 mg/dL (ref 8.4–10.4)
CHLORIDE: 107 meq/L (ref 98–109)
CO2: 29 meq/L (ref 22–29)
CREATININE: 1.2 mg/dL (ref 0.7–1.3)
EGFR: 63 mL/min/{1.73_m2} — ABNORMAL LOW (ref >90)
Glucose: 130 mg/dl (ref 70–140)
Potassium: 3.8 mEq/L (ref 3.5–5.1)
Sodium: 146 mEq/L — ABNORMAL HIGH (ref 136–145)
TOTAL PROTEIN: 7.4 g/dL (ref 6.4–8.3)

## 2016-04-15 LAB — CBC WITH DIFFERENTIAL/PLATELET
BASO%: 1 % (ref 0.0–2.0)
BASOS ABS: 0.1 10*3/uL (ref 0.0–0.1)
EOS ABS: 0.1 10*3/uL (ref 0.0–0.5)
EOS%: 1.3 % (ref 0.0–7.0)
HCT: 33 % — ABNORMAL LOW (ref 38.4–49.9)
HGB: 11 g/dL — ABNORMAL LOW (ref 13.0–17.1)
LYMPH%: 14.3 % (ref 14.0–49.0)
MCH: 28.3 pg (ref 27.2–33.4)
MCHC: 33.4 g/dL (ref 32.0–36.0)
MCV: 84.8 fL (ref 79.3–98.0)
MONO#: 0.6 10*3/uL (ref 0.1–0.9)
MONO%: 8.2 % (ref 0.0–14.0)
NEUT#: 5.3 10*3/uL (ref 1.5–6.5)
NEUT%: 75.2 % — AB (ref 39.0–75.0)
Platelets: 125 10*3/uL — ABNORMAL LOW (ref 140–400)
RBC: 3.89 10*6/uL — AB (ref 4.20–5.82)
RDW: 16.8 % — AB (ref 11.0–14.6)
WBC: 7.1 10*3/uL (ref 4.0–10.3)
lymph#: 1 10*3/uL (ref 0.9–3.3)

## 2016-04-15 LAB — IRON AND TIBC
%SAT: 12 % — AB (ref 20–55)
IRON: 44 ug/dL (ref 42–163)
TIBC: 363 ug/dL (ref 202–409)
UIBC: 319 ug/dL (ref 117–376)

## 2016-04-15 LAB — FERRITIN: FERRITIN: 54 ng/mL (ref 22–316)

## 2016-04-15 LAB — CEA (IN HOUSE-CHCC): CEA (CHCC-In House): 4 ng/mL (ref 0.00–5.00)

## 2016-04-15 NOTE — Progress Notes (Signed)
Patient was seen in GI clinic.  65 year old male diagnosed with Rectal cancer.  PMH includes anxiety, CHF, Depression, DM2, HTN, hypercholesterolemia, CAD.  Medications include Vit C, Ferrous sulfate, Remeron, MVI, and Kdur.  Labs include glucose 104.  Height: 5'9". Weight: 207.9 pounds. UBW: 209 pounds August 2017. BMI: 30.7.  Patient was seen in [facility]. Denies nutrition impact symptoms. Noted poor dentition.  Is concerned about finances.  Nutrition Diagnosis:  Food and nutrition related knowledge deficit related to rectal cancer as evidenced by no prior need for nutrition related information.  Intervention: Patient was educated to consume small frequent meals utilizing high-calorie, high-protein foods. Provided fact sheet on increasing calories and protein. Encouraged patient to avoid concentrated sweets Recommended patient follow a low fiber diet if/when he develops diarrhea and provided fact sheet. Provided samples of ensure Enlive and encouraged patient to use as needed. I offered to provide him with a complementary case of Ensure Plus if he likes product. Questions were answered.  Teach back method used.  Contact information given.  Monitoring, evaluation, goals: Patient will tolerate adequate calories and protein to minimize weight loss throughout treatment.  Next visit:  Patient to contact me for questions or concerns.  **Disclaimer: This note was dictated with voice recognition software. Similar sounding words can inadvertently be transcribed and this note may contain transcription errors which may not have been corrected upon publication of note.**

## 2016-04-15 NOTE — Patient Instructions (Signed)
Care Plan Summary- 04/15/2016 Name: Patrick [name]     DOB:  Oct 22, 1950 Your Medical Team: Medical Oncologist:  Dr. Truitt Merle Radiation Oncologist:  Dr. Kyung Rudd Surgeon:   Dr. Leighton Ruff Type of Cancer: Adenocarcinoma of rectum Stage/Grade: Undetermined *Exact staging of your cancer is based on size of the tumor, depth of invasion, involvement of lymph nodes or not, and whether or not the cancer has spread beyond the primary site   Recommendations: Based on information available as of today's consult. Recommendations may change depending on the results of further tests or exams. 1) Radiation therapy M-F for 5  weeks with oral Xeloda chemotherapy M-F twice daily-plan to start week after Thanksgiving or 12/04 at the latest 2) MRI pelvis on as scheduled for staging on 04/17/16 at 4:30 pm 3) Surgery will most likely follow the neoadjuvant chemo/radiation (2 months later) Next Steps: 1) Chemo education class and baseline labs  2) Simulation for RT-radiation oncology will call you 3) Xeloda script will be sent to [place] and you will be called when it is ready to pick up. Questions? Merceda Elks, RN, BSN at [phone]. [name] is your Oncology Nurse Navigator and is available to assist you while you're receiving your medical care at [facility].

## 2016-04-15 NOTE — Patient Instructions (Signed)
Skin Care and Bowel Hygiene  Anyone who has frequent bowel movements, diarrhea, or bowel leakage (fecal incontinence) may experience soreness or skin irritation around the anal region.  Occasionally, the skin can become so inflamed that it breaks into open sores.  Prevent skin breakdown by following good skin care habits.  Cleaning and Washing Techniques After having a bowel movement, men and women should tighten their anal sphincter before wiping.  Women should always wipe from front to back to prevent fecal matter from getting into the urethra and vagina.   Tips for Cleaning and Washing . wipe from front to back towards the anus . always wipe gently with soft toilet paper, or ideally with moist toilet paper . wipe only once with each piece of toilet paper so as not to re-contaminate the area . wash in warm water alone or with a minimal amount of mild, fragrance-free soap . use non-biological washing powder . gently pat skin completely dry, avoiding rubbing . if drying the skin after washing is difficult or uncomfortable, try using a hairdryer on a low setting (use very carefully) . allow air to get to the irritated area for some part of every day . use protective skin creams containing zinc as recommended by your doctor  What To Avoid . baths with extra-hot water . soaking for long periods of time in the bathtub . disinfectants and antiseptics  . bath oils, bath salts, and talcum powder . using plastic pants, pads, and sheets, which cause sweating . scratching at the irritated area  Additional Tips . some people find that citrus and acidic foods cause or worsen skin irritation . eat a healthy, balanced diet that is high in fiber  . drink plenty of fluids . wear cotton underwear to allow the skin to breath . talk to your healthcare provider about further treatment options; persistent problems need medical attention  Earlie Counts, PT [place], [place] [address]      [phone]  Tandem Stance    Can hold onto counter if not feeling steady. Right foot in front of left, heel touching toe both feet "straight ahead". Stand on Foot Triangle of Support with both feet. Balance in this position _30__ seconds. Do with left foot in front of right. 3 times each  Copyright  VHI. All rights reserved.    Stance: single leg on floor. Raise leg. Hold _15__ seconds. Repeat with other leg. __3_ reps per set, _1__ sets per day .  Can hold onto counter to keep steady  Copyright  VHI. All rights reserved.    Tips for Energy Conservation for Activities of Daily Living . Plan ahead to avoid rushing. . Sit down to bathe and dry off. Wear a terry robe instead of drying off. . Use a shower/bath organizer to decrease leaning and reaching. . Use extension handles on sponges and brushes. Susa Simmonds grab rails in the bathroom or use an elevated toilet seat. Hoyle Barr out clothes and toiletries before dressing. . Minimize leaning over to put on clothes and shoes. Bring your foot to your knee to apply socks and shoes. . Wear comfortable shoes and low-heeled, slip on shoes. Wear button front shirts rather than pullovers. Housekeeping . Schedule household tasks throughout the week. . Do housework sitting down when possible. . Delegate heavy housework, shopping, laundry and child care when possible. . Drag or slide objects rather than lifting. . Sit when ironing and take rest periods. . Stop working before becoming overly tired.  Shopping . Organize list by aisle. . Use a grocery cart for support. Marland Kitchen Shop at less busy times. . Ask for help with getting to the car. Meal Preparation . Use convenience and easy-to-prepare foods. . Use small appliances that take less effort to use. Marland Kitchen Prepare meals sitting down. . Soak dishes instead of scrubbing and let dishes air dry. . Prepare double portions and freeze half. Child Care . Plan activities that can be done sitting down, such  as drawing pictures, playing games, reading, and computer games. . Encourage children to climb up onto your lap or into the highchair instead of being lifted. . Make a game of the household chores so that children will want to help. . Delegate child care when possible.  Earlie Counts, PT, [facility]; [place], [place], [place] [address] Toileting Techniques for Bowel Movements (Defecation) Using your belly (abdomen) and pelvic floor muscles to have a bowel movement is usually instinctive.  Sometimes people can have problems with these muscles and have to relearn proper defecation (emptying) techniques.  If you have weakness in your muscles, organs that are falling out, decreased sensation in your pelvis, or ignore your urge to go, you may find yourself straining to have a bowel movement.  You are straining if you are: . holding your breath or taking in a huge gulp of air and holding it  . keeping your lips and jaw tensed and closed tightly . turning red in the face because of excessive pushing or forcing . developing or worsening your  hemorrhoids . getting faint while pushing . not emptying completely and have to defecate many times a day  If you are straining, you are actually making it harder for yourself to have a bowel movement.  Many people find they are pulling up with the pelvic floor muscles and closing off instead of opening the anus. Due to lack pelvic floor relaxation and coordination the abdominal muscles, one has to work harder to push the feces out.  Many people have never been taught how to defecate efficiently and effectively.  Notice what happens to your body when you are having a bowel movement.  While you are sitting on the toilet pay attention to the following areas: . Jaw and mouth position . Angle of your hips   . Whether your feet touch the ground or not . Arm placement  . Spine position . Waist . Belly tension . Anus  (opening of the anal canal)  An Evacuation/Defecation Plan   Here are the 4 basic points:  1. Lean forward enough for your elbows to rest on your knees 2. Support your feet on the floor or use a low stool if your feet Kaylee't touch the floor  3. Push out your belly as if you have swallowed a beach ball-you should feel a widening of your waist 4. Open and relax your pelvic floor muscles, rather than tightening around the anus      The following conditions my require modifications to your toileting posture:  . If you have had surgery in the past that limits your back, hip, pelvic, knee or ankle flexibility . Constipation   Your healthcare practitioner may make the following additional suggestions and adjustments:  1) Sit on the toilet  a) Make sure your feet are supported. b) Notice your hip angle and spine position-most people find it effective to lean forward or raise their knees, which can help the muscles around  the anus to relax  c) When you lean forward, place your forearms on your thighs for support  2) Relax suggestions a) Breath deeply in through your nose and out slowly through your mouth as if you are smelling the flowers and blowing out the candles. b) To become aware of how to relax your muscles, contracting and releasing muscles can be helpful.  Pull your pelvic floor muscles in tightly by using the image of holding back gas, or closing around the anus (visualize making a circle smaller) and lifting the anus up and in.  Then release the muscles and your anus should drop down and feel open. Repeat 5 times ending with the feeling of relaxation. c) Keep your pelvic floor muscles relaxed; let your belly bulge out. d) The digestive tract starts at the mouth and ends at the anal opening, so be sure to relax both ends of the tube.  Place your tongue on the roof of your mouth with your teeth separated.  This helps relax your mouth and will help to relax the anus at the same  time.  3) Empty (defecation) a) Keep your pelvic floor and sphincter relaxed, then bulge your anal muscles.  Make the anal opening wide.  b) Stick your belly out as if you have swallowed a beach ball. c) Make your belly wall hard using your belly muscles while continuing to breathe. Doing this makes it easier to open your anus. d) Breath out and give a grunt (or try using other sounds such as ahhhh, shhhhh, ohhhh or grrrrrrr).  4) Finish a) As you finish your bowel movement, pull the pelvic floor muscles up and in.  This will leave your anus in the proper place rather than remaining pushed out and down. If you leave your anus pushed out and down, it will start to feel as though that is normal and give you incorrect signals about needing to have a bowel movement.    Earlie Counts, PT [facility] [place] [address] [place], [place] [address]   Ways to get started on an exercise program 1.  Start for 10 minutes per day with a walking program. 2. Work towards 30 minutes of exercise per day 3. When you do an aerobic exercise program start on a low level 4. Water aerobics is a good place due to decreased strain on your joints 5. Begin your exercise program gradually and progress slowly over time 6. When exercising use correct form. a. Keep neutral spine b. Engage abdominals c. Keep chest up  d. Chin down e. Do not lock your knees  Earlie Counts, PT [facility] [place], [place], [place] [address] [phone]  Pelvic Floor Exercises for Bowel Control Exercises using both the external anal sphincter and the deep pelvic floor muscles can help you to improve your bowel control. When done correctly, these exercises can tone and strengthen the muscles to help you hold back gas and prevent fecal incontinence (leakage of stool). Exercise programs take time; you may not see any noticeable change in your bowel control immediately.   In some cases it may take several months to regain control. Bowel Control Muscles The anus and the anal canal, has rings of muscle around it. The outer ring of muscle is called the external anal sphincter; it is a voluntary muscle which you can learn to tighten and close more efficiently. When you contract it you will feel the skin around your anus  tighten and pull in as if the anus is winking. Try to keep the buttocks muscles relaxed. The inner ring around the anus is the internal anal sphincter. It is an involuntary and automatic muscle; you Henrique't have to think to keep it closed or open.  This muscle should be closed at all times, except when you are actually trying to have a bowel movement.  In addition to the sphincter muscles, there are deeper muscles called the levator ani that form a sling from your tailbone to your pubic bone. The levator ani muscle has a specific part called the puborectalis that holds stool in until you give the signal to relax and empty.  When you contract these muscles it creates a feeling of lifting the anus inward.   External Anal Sphincter     Levator ani deep layer   Effective Exercises for Control of Gas and Bowels . Identify the specific areas of the pelvic floor muscles you need to use.  This can be done using a mirror to see if you are contracting the correct muscles or by placing the pad of your finger at or just inside the anal opening. . Develop an exercise plan for strength, endurance and quick response of the muscles and stick with it.  You must make the muscles do more than they are used to doing. . Incorporate the exercises into your daily activities.

## 2016-04-15 NOTE — Progress Notes (Signed)
Oncology Nurse Navigator Documentation  Oncology Nurse Navigator Flowsheets 04/15/2016  Navigator Location [facility]  Referral date to RadOnc/MedOnc 04/06/2016  Navigator Encounter Type Clinic/MDC  Abnormal Finding Date 04/01/2016  Confirmed Diagnosis Date 04/01/2016  Multidisiplinary Clinic Date 04/15/2016  Patient Visit Type MedOnc;RadOnc  Treatment Phase Pre-Tx/Tx Discussion  Barriers/Navigation Needs Education;Coordination of Care;Financial  Education Understanding Cancer/ Treatment Options;Coping with Diagnosis/ Prognosis;Newly Diagnosed Cancer Education;Preparing for Upcoming Surgery/ Treatment;Concerns with Finances/ Eligibility  Interventions Education;Coordination of Care;Other--provided him with contact information of his financial advocate, Lenise White  Coordination of Care Appts--scheduled his chemo class and labs for next week; forwarded today's labs to Dr. Hochrein as requested by patient  Education Method Written;Verbal;Teach-back  Support Groups/Services [employer]--Personal Health Manager  Acuity Level 2  Time Spent with Patient 60  Met with patient  during new [facility]DC patient visit. Explained the role of the GI Nurse Navigator and provided New Patient Packet with information on: 1. Colorectal cancer--information on anatomy of colon/rectum; CEA test; map to location of [facility], SIM procedure information 2. Support groups 3. Advanced Directives 4. Fall Safety Plan Answered questions, reviewed current treatment plan using TEACH back and provided emotional support. Provided copy of current treatment plan. Mr. Wassmer comes to appointment today alone. His sister, of whom he is her caregiver is in SNF and expecting discharge home soon and he is concerned about this and his ability to care for her and himself a this time. CSW met with him as well today to review this stress on him. He has baseline depression/anxiety as well as chronic pain issues. Feels his  antidepressants are not working as well as they could. Tells RN he has no enjoyment of life, but denies any suicidal ideations. Will route today's note to his PCP and his verbalization of his depression status. He reports feeling overwhelmed, but has general understanding of treatment plan after it was reviewed w/him by navigator.    , RN, BSN GI Oncology Navigator [facility] 

## 2016-04-15 NOTE — Therapy (Addendum)
[facility] [address] [place], [place], [address] Phone: [phone]   Fax:  [phone]  Physical Therapy Evaluation  Patient Details  Name: Patrick [name] MRN: [id number] Date of Birth: 1950/06/11 Referring Provider: Dr. Burr Medico  Encounter Date: 04/15/2016      PT End of Session - 04/15/16 1104    Visit Number 1   PT Start Time [id number]   PT Stop Time 1057   PT Time Calculation (min) 20 min   Activity Tolerance Patient tolerated treatment well   Behavior During Therapy [facility] for tasks assessed/performed      Past Medical History:  Diagnosis Date  . Arthritis    "right hand; right knee" (06/19/2014)  . CAD (coronary artery disease)    2v CAD with subtotal occlusion of small co-dominant RCA and borderline lesion in moderate-sized OM-1  . CHF (congestive heart failure) (HCC)    Preserved EF  . Degenerative joint disease of knee, right   . Enlarged prostate   . Hyperlipidemia   . Hypertension   . Pneumonia 05/2014  . Prediabetes 10/03/2014  . Scrotal edema 04/03/2015  . SMALL BOWEL OBSTRUCTION, HX OF 08/21/2007   Annotation: with narrowing in the ileocecal region Qualifier: Diagnosis of  By: Hassell Done FNP, Tori Milks      Past Surgical History:  Procedure Laterality Date  . CARDIAC CATHETERIZATION N/A 02/16/2016   Procedure: Right/Left Heart Cath and Coronary Angiography;  Surgeon: Jolaine Artist, MD;  Location: [facility];  Service: Cardiovascular;  Laterality: N/A;  . COLONOSCOPY N/A 04/01/2016   Procedure: COLONOSCOPY;  Surgeon: Leighton Ruff, MD;  Location: [facility];  Service: Endoscopy;  Laterality: N/A;  . INGUINAL HERNIA REPAIR Left 1990's    There were no vitals filed for this visit.       Subjective Assessment - 04/15/16 1159    Subjective Patient is attending GI clinic   Patient Stated Goals education   Currently in Pain? Yes   Pain Score 6    Pain Location Hip   Pain Orientation  Right;Left   Pain Descriptors / Indicators Aching   Pain Type Chronic pain   Pain Onset More than a month ago   Pain Frequency Constant   Aggravating Factors  sitting, getting out of chair, first thing in morning   Pain Relieving Factors medication   Multiple Pain Sites No            [facility] PT Assessment - 04/15/16 0001      Assessment   Medical Diagnosis Rectal cancer   Referring Provider Dr. Burr Medico   Onset Date/Surgical Date 04/01/16   Prior Therapy None     Precautions   Precautions Other (comment)   Precaution Comments Cancer     Restrictions   Weight Bearing Restrictions No     Balance Screen   Has the patient fallen in the past 6 months No   Has the patient had a decrease in activity level because of a fear of falling?  No   Is the patient reluctant to leave their home because of a fear of falling?  No     Home Ecologist residence   Living Arrangements Other relatives     Prior Function   Level of Independence Independent     Cognition   Overall Cognitive Status Within Functional Limits for tasks assessed     Observation/Other Assessments   Focus on Therapeutic Outcomes (FOTO)  therapist discretion limited  by 40% due to sit to stand score     Posture/Postural Control   Posture/Postural Control No significant limitations     ROM / Strength   AROM / PROM / Strength AROM;Strength     AROM   Overall AROM  Within functional limits for tasks performed  lumbar     Strength   Overall Strength Comments bil. quads 4/5     Ambulation/Gait   Ambulation/Gait No     Standardized Balance Assessment   Five times sit to stand comments  16 sec                           PT Education - 04/15/16 1129    Education provided Yes   Education Details toileting technique; balance exercise; conserving energy, pelvic floor exercise, walking program, ways to take care of skin during radiation   Person(s) Educated Patient    Methods Explanation;Demonstration;Handout   Comprehension Returned demonstration;Verbalized understanding             PT Long Term Goals - 04/15/16 1133      PT LONG TERM GOAL #1   Title independent with contraction of pelvic floor muscles, balance exercises, walking program   Time 1   Period Days   Status Achieved     PT LONG TERM GOAL #2   Title understand how to take care of skin while having radiation treatment   Time 1   Period Days   Status Achieved     PT LONG TERM GOAL #3   Title understand correct toileting technique to decrease strain on pelvic floor   Time 1   Period Days   Status Achieved     PT LONG TERM GOAL #4   Title understand how to conserve energy during daily tasks to avoid being fatiqued   Time 1   Period Days   Status Achieved               Plan - 04/15/16 1122    Clinical Impression Statement Patient is attending GI clinic for assessment with his cancer team.  Patient reports constant pain in bilateral hips at level 6/10 that is a dull ache.  Patient is worse with sitting, morning, and cold weather. Patient was diagnosed on 04/01/2016 with colonoscopy. Patietn reports no falls. Patient walks 3 times per week for 15-20 minutes.  Patient lives with his sister but is her primary caregiver at this time. Sit to stand is 16 seconds indicating a chance of falls. Bilateral knee extension is 4/5. Patient is of low complexity due to no comorbidities that will impact his care.  Patient wil be having chemotherapy and radiation prior to radiation for treatment and then he may be a higher complexity.  Patient benefit from physical therapy to educate on ways to manage affects from cancer treatment and work on his balance.    Rehab Potential Excellent   Clinical Impairments Affecting Rehab Potential Recta cancer   PT Frequency 1x / week   PT Duration Other (comment)  1 time in GI clinic   PT Treatment/Interventions Neuromuscular re-education;Therapeutic  exercise;Therapeutic activities;Patient/family education   PT Next Visit Plan Discharge to HEP   PT Home Exercise Plan current HEP   Recommended Other Services None   Consulted and Agree with Plan of Care Patient      Patient will benefit from skilled therapeutic intervention in order to improve the following deficits and impairments:  Decreased strength  Visit  Diagnosis: Muscle weakness (generalized) - Plan: PT plan of care cert/re-cert  Aftercare following surgery for neoplasm - Plan: PT plan of care cert/re-cert      G-Codes - 0000000 1136    Functional Assessment Tool Used FOTO score is 40% limitation   Functional Limitation Other PT primary   Other PT Primary Current Status [id number]) At least 40 percent but less than 60 percent impaired, limited or restricted   Other PT Primary Goal Status [id number]) At least 40 percent but less than 60 percent impaired, limited or restricted   Other PT Primary Discharge Status [id number]) At least 40 percent but less than 60 percent impaired, limited or restricted       Problem List Patient Active Problem List   Diagnosis Date Noted  . Iron deficiency anemia due to chronic blood loss 04/15/2016  . 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 Chronic renal disease, stage III 02/05/2016  . Abnormal finding on cardiovascular stress test 02/05/2016  . Cardiomyopathy, ischemic 06/17/2015  . Chronic systolic heart failure ([place]) 06/17/2015  . Major depressive disorder, recurrent episode ([place]) 04/17/2015  . Hypokalemia 08/29/2014  . Other iron deficiency anemias   . Health care maintenance 03/11/2014  . Obesity, unspecified 10/03/2013  . Dyslipidemia 08/13/2013  . CARDIAC MURMUR 01/26/2010  . MACULAR DEGENERATION 05/02/2008  . ERECTILE DYSFUNCTION 03/13/2007  . Essential hypertension 01/16/2007  . BPH (benign prostatic hyperplasia) 01/16/2007    Earlie Counts, PT [facility] [address] [place], [place], [address] Phone: [phone]   Fax:  [phone]  Name: Patrick [name] MRN: [id number] Date of Birth: 12-12-1950

## 2016-04-15 NOTE — Progress Notes (Signed)
[facility] Psychosocial Distress Screening Clinical Social Work  Clinical Social Work met with Patrick [name] at [employer], where he presents alone, to introduce self, explain role of CSW/Support Team and review the distress screening protocol.  The patient scored a 7 on the Psychosocial Distress Thermometer which indicates severe distress. Clinical Social Worker reviewed screen in detail to assess for distress and other psychosocial needs. Patrick [name] reports to live with his sister and is her caregiver. Her reports she has many health problems and is also overweight that impacts further on her health. Currently, sister is at a SNF, but will be discharging home soon. Mr. Friedel shared this is his biggest stressor as he begins his own treatment for his cancer. He reports they have no other support and have strained relations with extended family. CSW reviewed additional options for caregiver support in the community.   Patrick [name] shared he has struggled with depression and anxiety for many years. He reports he takes remeron and trazadone to help, but feels they are not really helping currently. He shared these medications were started by his PCP at [facility]. CSW discussed possible need for review of these medications along with additional counseling support to assist. Patrick [name] interested in additional support and CSW educated Patrick [name] on GI Support Group, counseling through CSW and Spiritual Care. CSW also educated Patrick [name] on common emotions and coping techniques related to cancer diagnosis and caregiving concerns. Patrick [name] open to CSW following and will also reach out as needed.  CSW concern about need for possible medication review with Dr. Burr Medico. She will review with [facility].   Patrick [name] currently on medicaid and food stamps. He reports to have ongoing financial concerns. CSW reviewed options for additional asst and provided Patrick [name] with financial counselor info as well. CSW will also follow and assist for additional resources due to current financial toxicity. Patrick [name] provided  with handouts about CSW team and Support Programs. Patrick [name] aware to reach out as needed.   ONCBCN DISTRESS SCREENING 04/15/2016  Screening Type Initial Screening  Distress experienced in past week (1-10) 7  Family Problem type Other (comment)  Emotional problem type Depression;Nervousness/Anxiety;Adjusting to illness  Spiritual/Religous concerns type Facing my mortality  Physical Problem type Pain;Loss of appetitie;Constipation/diarrhea  Physician notified of physical symptoms Yes  Referral to clinical social work Yes  Referral to financial advocate Yes  Referral to support programs Yes     Clinical Social Worker follow up needed: Yes.    If yes, follow up plan:  See above  Loren Racer, [place] Worker [place]  [facility] Phone: [phone] Fax: [phone]

## 2016-04-15 NOTE — Progress Notes (Signed)
[place]  Telephone:([phone] Fax:([phone]) [phone]  Clinic New Consult Note   Patient Care Team: Sela Hua, MD as PCP - General (Family Medicine) 04/15/2016  REFERRAL PHYSICIAN: Dr. Marcello Moores   CHIEF COMPLAINTS/PURPOSE OF CONSULTATION:  Newly diagnosed rectal cancer    Adenocarcinoma of rectum ([place])   04/01/2016 Initial Diagnosis    Adenocarcinoma of rectum ([place])      04/01/2016 Procedure    Colonoscopy showed a fungating nonobstructing medium-sized mass in the distal rectum, measured 3 cm in lengths. Diverticulosis in the sigmoid colon.      04/01/2016 Initial Biopsy    Rectal mass biopsy showed adenocarcinoma, grade 2      04/05/2016 Imaging    CT chest, abdomen and pelvis with contrast showed no primary rectal carcinoma is not well visualized, 7 mm left posterior perirectal and presacral lymph nodes, indeterminate 1.4 cm low to attenuation lesion in the left hepatic lobe, question hepatic cirrhosis, recommend abdominal MRI for further evaluation. No other evidence of distant metastasis.       HISTORY OF PRESENTING ILLNESS (04/15/2016):  Patrick [name] 65 y.o. male is here because of his recent diagnosed rectal cancer. He presents to our multidisciplinary GI clinic by himself today.  He has had intermittent rectal bleeding for a few years, which she contributes to hemorrhoids, slightly worse lately. He used to have regular bowel movement twice a day, which has been more irregular lately, and occasional loose or watery bowel movement. No significant rectal pain. He was seen by his urologist for routine follow-up of his BPH, and on the rectal exam rectal mass was palpated, and he was referred to colorectal surgeon Dr. Marcello Moores. Colonoscopy on 04/01/2016 showed a fungating nonobstructing medium-sized mass in the distal rectum, 3 cm in length. Biopsy showed adenocarcinoma grade 2. CT CAP was negative distant metastasis, except a 1.4cm indeterminate lesion in the  liver.  Review of systems also reviewed slightly decreased appetite, no signal weight loss. No other pain, nausea, or other symptoms. He has significant cardiomyopathy, nonischemic, has orthopnea, mild, able to function and tolerating routine activity at home. He is the primary caregiver of his sister, who is continuous nursing home. He is single, no children. Retired. No family history of colon cancer.  MEDICAL HISTORY:  Past Medical History:  Diagnosis Date  . Arthritis    "right hand; right knee" (06/19/2014)  . CAD (coronary artery disease)    2v CAD with subtotal occlusion of small co-dominant RCA and borderline lesion in moderate-sized OM-1  . CHF (congestive heart failure) (HCC)    Preserved EF  . Degenerative joint disease of knee, right   . Enlarged prostate   . Hyperlipidemia   . Hypertension   . Pneumonia 05/2014  . Prediabetes 10/03/2014  . Scrotal edema 04/03/2015  . SMALL BOWEL OBSTRUCTION, HX OF 08/21/2007   Annotation: with narrowing in the ileocecal region Qualifier: Diagnosis of  By: Hassell Done FNP, Tori Milks      SURGICAL HISTORY: Past Surgical History:  Procedure Laterality Date  . CARDIAC CATHETERIZATION N/A 02/16/2016   Procedure: Right/Left Heart Cath and Coronary Angiography;  Surgeon: Jolaine Artist, MD;  Location: [facility];  Service: Cardiovascular;  Laterality: N/A;  . COLONOSCOPY N/A 04/01/2016   Procedure: COLONOSCOPY;  Surgeon: Leighton Ruff, MD;  Location: [facility];  Service: Endoscopy;  Laterality: N/A;  . INGUINAL HERNIA REPAIR Left 1990's    SOCIAL HISTORY: Social History   Social History  . Marital status: Single  Spouse name: N/A  . Number of children: N/A  . Years of education: N/A   Occupational History  . Not on file.   Social History Main Topics  . Smoking status: Former Smoker    Years: 5.00    Types: Pipe, Landscape architect  . Smokeless tobacco: Never Used     Comment: 06/19/2014 "stopped smoking in ~ 2014; used to smoke a pipe  or cigar a couple times/month"  . Alcohol use No  . Drug use: No  . Sexual activity: No   Other Topics Concern  . Not on file   Social History Narrative  . No narrative on file    FAMILY HISTORY: Family History  Problem Relation Age of Onset  . Hypertension Mother   . Diabetes Mother   . Stroke Mother   . Cancer Father 37    Prostate  . Dementia Father   . COPD Sister   . Arthritis Sister   . Edema Sister     ALLERGIES:  has No Known Allergies.  MEDICATIONS:  Current Outpatient Prescriptions  Medication Sig Dispense Refill  . acetaminophen (TYLENOL) 500 MG tablet Take 1,000 mg by mouth every 6 (six) hours as needed for mild pain.    . Ascorbic Acid (VITAMIN C) 1000 MG tablet Take 2,000 mg by mouth 2 (two) times daily.    Marland Kitchen aspirin EC 81 MG EC tablet Take 1 tablet (81 mg total) by mouth daily. 30 tablet 0  . carvedilol (COREG) 25 MG tablet Take 1 tablet (25 mg total) by mouth 2 (two) times daily. 180 tablet 3  . ferrous sulfate 325 (65 FE) MG tablet take 1 tablet by mouth once daily WITH BREAKFAST 30 tablet 3  . finasteride (PROSCAR) 5 MG tablet Take 1 tablet (5 mg total) by mouth daily. 90 tablet 1  . losartan (COZAAR) 100 MG tablet Take 1 tablet (100 mg total) by mouth daily. 30 tablet 11  . lovastatin (MEVACOR) 20 MG tablet Take 1 tablet (20 mg total) by mouth at bedtime. 90 tablet 1  . mirtazapine (REMERON SOL-TAB) 15 MG disintegrating tablet place 1 tablet ON TONGUE at bedtime (Patient taking differently: Take 15 mg by mouth at bedtime. place 1 tablet ON TONGUE at bedtime) 90 tablet 0  . Multiple Vitamins-Minerals (MULTIVITAMIN WITH MINERALS) tablet Take 1 tablet by mouth daily.    Marland Kitchen omega-3 acid ethyl esters (LOVAZA) 1 g capsule take 2 capsules by mouth twice a day 360 capsule 1  . oxyCODONE-acetaminophen (PERCOCET/ROXICET) 5-325 MG tablet Take 1 tablet by mouth every 8 (eight) hours as needed for severe pain. 90 tablet 0  . potassium chloride SA (K-DUR,KLOR-CON) 20  MEQ tablet TAKE 3 TABLET BY MOUTH EVERY DAY (Patient taking differently: Take 20 mEq by mouth 3 (three) times daily. ) 270 tablet 3  . tamsulosin (FLOMAX) 0.4 MG CAPS capsule Take 0.4 mg by mouth daily.     Marland Kitchen torsemide (DEMADEX) 20 MG tablet take 4 tablets by mouth twice a day 720 tablet 0  . traZODone (DESYREL) 100 MG tablet Take 1 tablet (100 mg total) by mouth 2 (two) times daily. 180 tablet 1   No current facility-administered medications for this visit.     REVIEW OF SYSTEMS:   Constitutional: Denies fevers, chills or abnormal night sweats Eyes: Denies blurriness of vision, double vision or watery eyes Ears, nose, mouth, throat, and face: Denies mucositis or sore throat Respiratory: Denies cough, dyspnea or wheezes Cardiovascular: Denies palpitation, chest discomfort or lower extremity  swelling Gastrointestinal:  Denies nausea, heartburn or change in bowel habits Skin: Denies abnormal skin rashes Lymphatics: Denies new lymphadenopathy or easy bruising Neurological:Denies numbness, tingling or new weaknesses Behavioral/Psych: Mood is stable, no new changes  All other systems were reviewed with the patient and are negative.  PHYSICAL EXAMINATION: ECOG PERFORMANCE STATUS: 1 - Symptomatic but completely ambulatory  Vitals:   04/15/16 0823  BP: (!) 148/79  Pulse: 66  Resp: 18  Temp: 97.7 F (36.5 C)   Filed Weights   04/15/16 0823  Weight: 207 lb 14.4 oz (94.3 kg)    GENERAL:alert, no distress and comfortable SKIN: skin color, texture, turgor are normal, no rashes or significant lesions EYES: normal, conjunctiva are pink and non-injected, sclera clear OROPHARYNX:no exudate, no erythema and lips, buccal mucosa, and tongue normal  NECK: supple, thyroid normal size, non-tender, without nodularity LYMPH:  no palpable lymphadenopathy in the cervical, axillary or inguinal LUNGS: clear to auscultation and percussion with normal breathing effort HEART: regular rate & rhythm and no  murmurs and no lower extremity edema ABDOMEN:abdomen soft, non-tender and normal bowel sounds, rectal exam showed a firm mass in the low rectum anteriorly, no bleeding. Bilateral inguinal lymph nodes were negative. Musculoskeletal:no cyanosis of digits and no clubbing  PSYCH: alert & oriented x 3 with fluent speech NEURO: no focal motor/sensory deficits  LABORATORY DATA:  I have reviewed the data as listed CBC Latest Ref Rng & Units 02/09/2016 07/17/2015 04/07/2015  WBC 3.8 - 10.8 K/uL 6.2 - 6.1  Hemoglobin 13.2 - 17.1 g/dL 12.5(L) 10.9(A) 9.4(L)  Hematocrit 38.5 - 50.0 % 38.0(L) - 32.9(L)  Platelets 140 - 400 K/uL 109(L) - 124(L)   CMP Latest Ref Rng & Units 02/09/2016 06/17/2015 05/18/2015  Glucose 65 - 99 mg/dL 104(H) 87 83  BUN 7 - 25 mg/dL _0 Creatinine 0.70 - 1.25 mg/dL 1.23 1.22 1.28(H)  Sodium 135 - 146 mmol/L 145 145 143  Potassium 3.5 - 5.3 mmol/L 3.9 3.6 3.7  Chloride 98 - 110 mmol/L 106 103 103  CO2 20 - 31 mmol/L 29 34(H) 31  Calcium 8.6 - 10.3 mg/dL 8.9 9.3 8.8  Total Protein 6.1 - 8.1 g/dL 7.0 - -  Total Bilirubin 0.2 - 1.2 mg/dL 0.8 - -  Alkaline Phos 40 - 115 U/L 97 - -  AST 10 - 35 U/L 19 - -  ALT 9 - 46 U/L 12 - -   PATHOLOGY REPORT  Diagnosis 04/01/2016 Rectum, biopsy, mass INVASIVE ADENOCARCINOMA, GRADE 2 MARGIN OF RESECTION IS POSITIVE FOR ADENOCARCINOMA  MMR-normal   RADIOGRAPHIC STUDIES: I have personally reviewed the radiological images as listed and agreed with the findings in the report. Ct Chest W Contrast  Result Date: 04/05/2016 CLINICAL DATA:  Newly diagnosed rectal adenocarcinoma.  Staging. Creatinine was obtained on site at [facility] at [address]Results: Creatinine 1.2 mg/dL. EXAM: CT CHEST, ABDOMEN, AND PELVIS WITH CONTRAST TECHNIQUE: Multidetector CT imaging of the chest, abdomen and pelvis was performed following the standard protocol during bolus administration of intravenous contrast. CONTRAST:  118m ISOVUE-300  IOPAMIDOL (ISOVUE-300) INJECTION 61% COMPARISON:  Chest CTA on 06/01/2014 and AP CT on 09/30/2012 FINDINGS: CT CHEST FINDINGS Cardiovascular: No acute findings. Stable cardiomegaly and coronary artery calcification. Mediastinum/Lymph Nodes: No masses or pathologically enlarged lymph nodes identified. Lungs/Pleura: No pulmonary infiltrate or mass identified. No effusion present. Musculoskeletal: No suspicious bone lesions or other significant abnormality. CT ABDOMEN AND PELVIS FINDINGS Hepatobiliary: A 1.4 cm ill-defined low-attenuation lesion is  seen in the left hepatic lobe at the junction of segments 2 and 3 on image 54/3. This was not seen on previous study. No other liver lesions are identified. Mild enlargement of caudate and left lobes is seen with mild capsular nodularity, suspicious for hepatic cirrhosis. Pancreas:  No mass or inflammatory changes. Spleen:  Within normal limits in size and appearance. Adrenals/Urinary tract: No masses or hydronephrosis. Tiny sub-cm low-attenuation renal lesions are too small to characterize, but show no significant change in most likely represent tiny cysts. Mild diffuse bladder wall thickening is likely due to chronic bladder outlet obstruction. A 9 mm calculus is seen in the urinary bladder along the left posterior wall. Stomach/Bowel: Known primary rectal carcinoma not well visualized by CT. No evidence of obstruction, inflammatory process, or abnormal fluid collections. Diverticulosis is seen involving the descending and sigmoid colon, however there is no evidence of diverticulitis. Vascular/Lymphatic: A 7 mm left posterior perirectal/presacral lymph node is seen on image 108/series 3. No other pathologically enlarged lymph nodes identified within the abdomen or pelvis. No abdominal aortic aneurysm. Aortic atherosclerosis. Reproductive: Moderately enlarged prostate gland seen with mass effect on bladder base. Other:  None. Musculoskeletal: No suspicious bone lesions  identified. Right greater than left hip osteoarthritis. Degenerative lumbar spondylosis, with severe degenerative disc disease at L2-3. IMPRESSION: Known primary rectal carcinoma not well visualized by CT. 7 mm left posterior perirectal/ presacral lymph node ; local lymph node metastasis cannot be excluded. Indeterminate 1.4 cm low-attenuation lesion in left hepatic lobe. Liver metastasis cannot be excluded. Question hepatic cirrhosis. Recommend abdomen MRI without and with contrast for further evaluation. No evidence of metastatic disease within the thorax. Colonic diverticulosis. No radiographic evidence of diverticulitis. Moderately enlarged prostate and findings of chronic bladder outlet obstruction. 9 mm nonobstructive calculus within urinary bladder. Cholelithiasis.  No radiographic evidence of cholecystitis. Electronically Signed   By: Earle Gell M.D.   On: 04/05/2016 10:46   Ct Abdomen Pelvis W Contrast  Result Date: 04/05/2016 CLINICAL DATA:  Newly diagnosed rectal adenocarcinoma.  Staging. Creatinine was obtained on site at [facility] at [address]Results: Creatinine 1.2 mg/dL. EXAM: CT CHEST, ABDOMEN, AND PELVIS WITH CONTRAST TECHNIQUE: Multidetector CT imaging of the chest, abdomen and pelvis was performed following the standard protocol during bolus administration of intravenous contrast. CONTRAST:  168m ISOVUE-300 IOPAMIDOL (ISOVUE-300) INJECTION 61% COMPARISON:  Chest CTA on 06/01/2014 and AP CT on 09/30/2012 FINDINGS: CT CHEST FINDINGS Cardiovascular: No acute findings. Stable cardiomegaly and coronary artery calcification. Mediastinum/Lymph Nodes: No masses or pathologically enlarged lymph nodes identified. Lungs/Pleura: No pulmonary infiltrate or mass identified. No effusion present. Musculoskeletal: No suspicious bone lesions or other significant abnormality. CT ABDOMEN AND PELVIS FINDINGS Hepatobiliary: A 1.4 cm ill-defined low-attenuation lesion is seen in the left  hepatic lobe at the junction of segments 2 and 3 on image 54/3. This was not seen on previous study. No other liver lesions are identified. Mild enlargement of caudate and left lobes is seen with mild capsular nodularity, suspicious for hepatic cirrhosis. Pancreas:  No mass or inflammatory changes. Spleen:  Within normal limits in size and appearance. Adrenals/Urinary tract: No masses or hydronephrosis. Tiny sub-cm low-attenuation renal lesions are too small to characterize, but show no significant change in most likely represent tiny cysts. Mild diffuse bladder wall thickening is likely due to chronic bladder outlet obstruction. A 9 mm calculus is seen in the urinary bladder along the left posterior wall. Stomach/Bowel: Known primary rectal carcinoma not well visualized by  CT. No evidence of obstruction, inflammatory process, or abnormal fluid collections. Diverticulosis is seen involving the descending and sigmoid colon, however there is no evidence of diverticulitis. Vascular/Lymphatic: A 7 mm left posterior perirectal/presacral lymph node is seen on image 108/series 3. No other pathologically enlarged lymph nodes identified within the abdomen or pelvis. No abdominal aortic aneurysm. Aortic atherosclerosis. Reproductive: Moderately enlarged prostate gland seen with mass effect on bladder base. Other:  None. Musculoskeletal: No suspicious bone lesions identified. Right greater than left hip osteoarthritis. Degenerative lumbar spondylosis, with severe degenerative disc disease at L2-3. IMPRESSION: Known primary rectal carcinoma not well visualized by CT. 7 mm left posterior perirectal/ presacral lymph node ; local lymph node metastasis cannot be excluded. Indeterminate 1.4 cm low-attenuation lesion in left hepatic lobe. Liver metastasis cannot be excluded. Question hepatic cirrhosis. Recommend abdomen MRI without and with contrast for further evaluation. No evidence of metastatic disease within the thorax. Colonic  diverticulosis. No radiographic evidence of diverticulitis. Moderately enlarged prostate and findings of chronic bladder outlet obstruction. 9 mm nonobstructive calculus within urinary bladder. Cholelithiasis.  No radiographic evidence of cholecystitis. Electronically Signed   By: Earle Gell M.D.   On: 04/05/2016 10:46   COLONOSCOPY 04/01/2016 Dr. Marcello Moores  A fungating non-obstructing medium-sized mass was found in the distal rectum. The mass was noncircumferential. The mass measured three cm in length. No bleeding was present. Biopsies were taken with a cold forceps for histology. IMPRESSION  - Diverticulosis in the sigmoid colon. 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 Biopsied.  ASSESSMENT & PLAN: 65 year old Caucasian male, with past medical history of hypertension, chronic systolic heart failure with EF of 20-25%, iron deficient anemia, presented with a palpable rectal mass.  1. Adenoma of rectum, low anterior rectum, [id number] -I reviewed his CT scan, endoscopy, and biopsy results in details with patient -His CT scan reviewed no definitive evidence of distant metastasis, however he a 1.4 cm indeterminate liver lesion, need further workup. He is scheduled to have abdominal MRI for that -His staging pelvic MRI is scheduled for next week -We reviewed the natural history of rectal cancer and treatment options -If he has locally advanced rectal cancer (>T2 or positive node) and no liver metastasis, I recommended him to have neoadjuvant chemoradiation. Option of Xeloda and 5-fu infusion were discussed with patient in detail, he opted Xeloda if needed --Chemotherapy consent: Side effects including but does not not limited to, fatigue, nausea, vomiting, diarrhea, hair loss, neuropathy, fluid retention, renal and kidney dysfunction, neutropenic fever, needed for blood transfusion, bleeding, coronary artery spasm and heart attack, were discussed with patient in great detail. He agrees to proceed if  needed. 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 HTN, chronic systolic heart failure with EF 20-25% -He will continue follow-up with his primary care physician and cardiologist -Continue medication  3. Iron deficient anemia -His on study in 2016 was consistent with iron deficiency -She is on oral iron, we'll continue -I'll repeat his CBC and iron studies today, to see if he needs IV iron.  Plan -lab today, including CBC, CMP, ferritin, serum iron and TIBC, CEA -We'll call him about his MRI results next week -If he needs neoadjuvant chemotherapy and irradiation based on his staging MRI, I'll send a prescription of Xeloda to specialty pharmacy -I plan to see him on his first day of chemotherapy and radiation, sooner if his abdomen MRI is abnormal  All questions were answered. The  patient knows to call the clinic with any problems, questions or concerns. I spent 55 minutes counseling the patient face to face. The total time spent in the appointment was 60 minutes and more than 50% was on counseling.     Truitt Merle, MD 04/15/2016 10:14 AM

## 2016-04-17 ENCOUNTER — Ambulatory Visit
Admission: RE | Admit: 2016-04-17 | Discharge: 2016-04-17 | Disposition: A | Discharge status: Home / Self Care | Payer: Medicaid Other | Source: Ambulatory Visit | Attending: General Surgery | Admitting: General Surgery

## 2016-04-17 DIAGNOSIS — C801 Malignant (primary) neoplasm, unspecified: Secondary | ICD-10-CM

## 2016-04-17 DIAGNOSIS — K7460 Unspecified cirrhosis of liver: Secondary | ICD-10-CM | POA: Diagnosis not present

## 2016-04-18 NOTE — Progress Notes (Signed)
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  No matching staging information was found for the patient.   HISTORY OF PRESENT ILLNESS::  Patrick [name] is a 65 y.o. male seen at the request of Dr. Marcello Moores for a new diagnosis of rectal cancer. The patient was being evaluated by his urologist and on rectal examination was found to have a palpable mass. He apparently had intermittent rectal bleeding as well. He met with Dr. Marcello Moores who recommended colonoscopy she performed on 04/01/16. This revealed a fungating, non obstructing medium sized mass at the distal rectum, measuring 3 cm in length. Biopsies were consistent with adenocarcinoma. He had CT imaging of the c/a/p on 04/04/16 which revealed thickening of the rectum, and a 7 mm left posterior perirectal/presacral node with a 1.4 cm low attenuation lesion in the left hepatic lobe. He comes today to the multidisciplinary GI clinic to discuss options for his treatment, and to specifically discuss radiotherapy. On 04/17/16, he is scheduled to undergo MRI of the pelvis to evaluate for locoregional disease, and of the abdomen to evaluate his liver lesion.  PREVIOUS RADIATION THERAPY: No   PAST MEDICAL HISTORY:  has a past medical history of Arthritis; CAD (coronary artery disease); CHF (congestive heart failure) ([place]); Degenerative joint disease of knee, right; Enlarged prostate; Hyperlipidemia; Hypertension; Pneumonia (05/2014); Prediabetes (10/03/2014); Scrotal edema (04/03/2015); and SMALL BOWEL OBSTRUCTION, HX OF (08/21/2007).     PAST SURGICAL HISTORY: Past Surgical History:  Procedure Laterality Date  . CARDIAC CATHETERIZATION N/A 02/16/2016   Procedure: Right/Left Heart Cath and Coronary Angiography;   Surgeon: Jolaine Artist, MD;  Location: [facility];  Service: Cardiovascular;  Laterality: N/A;  . COLONOSCOPY N/A 04/01/2016   Procedure: COLONOSCOPY;  Surgeon: Leighton Ruff, MD;  Location: [facility];  Service: Endoscopy;  Laterality: N/A;  . INGUINAL HERNIA REPAIR Left 1990's     FAMILY HISTORY: family history includes Arthritis in his sister; COPD in his sister; Cancer (age of onset: 66) in his father; Dementia in his father; Diabetes in his mother; Edema in his sister; Hypertension in his mother; Stroke in his mother.   SOCIAL HISTORY:  reports that he has quit smoking. His smoking use included Pipe and Cigars. He quit after 5.00 years of use. He has never used smokeless tobacco. He reports that he does not drink alcohol or use drugs.   ALLERGIES: Patient has no known allergies.   MEDICATIONS:  Current Outpatient Prescriptions  Medication Sig Dispense Refill  . acetaminophen (TYLENOL) 500 MG tablet Take 1,000 mg by mouth every 6 (six) hours as needed for mild pain.    . Ascorbic Acid (VITAMIN C) 1000 MG tablet Take 2,000 mg by mouth 2 (two) times daily.    Marland Kitchen aspirin EC 81 MG EC tablet Take 1 tablet (81 mg total) by mouth daily. 30 tablet 0  . carvedilol (COREG) 25 MG tablet Take 1 tablet (25 mg total) by mouth 2 (two) times daily. 180 tablet 3  . ferrous sulfate 325 (65 FE) MG tablet take 1 tablet by mouth once daily WITH BREAKFAST 30 tablet 3  . finasteride (PROSCAR) 5 MG tablet Take 1 tablet (5 mg total) by mouth daily. 90 tablet 1  . losartan (  COZAAR) 100 MG tablet Take 1 tablet (100 mg total) by mouth daily. 30 tablet 11  . lovastatin (MEVACOR) 20 MG tablet Take 1 tablet (20 mg total) by mouth at bedtime. 90 tablet 1  . mirtazapine (REMERON SOL-TAB) 15 MG disintegrating tablet place 1 tablet ON TONGUE at bedtime (Patient taking differently: Take 15 mg by mouth at bedtime. place 1 tablet ON TONGUE at bedtime) 90 tablet 0  . Multiple Vitamins-Minerals (MULTIVITAMIN  WITH MINERALS) tablet Take 1 tablet by mouth daily.    Marland Kitchen omega-3 acid ethyl esters (LOVAZA) 1 g capsule take 2 capsules by mouth twice a day 360 capsule 1  . oxyCODONE-acetaminophen (PERCOCET/ROXICET) 5-325 MG tablet Take 1 tablet by mouth every 8 (eight) hours as needed for severe pain. 90 tablet 0  . potassium chloride SA (K-DUR,KLOR-CON) 20 MEQ tablet TAKE 3 TABLET BY MOUTH EVERY DAY (Patient taking differently: Take 20 mEq by mouth 3 (three) times daily. ) 270 tablet 3  . tamsulosin (FLOMAX) 0.4 MG CAPS capsule Take 0.4 mg by mouth daily.     Marland Kitchen torsemide (DEMADEX) 20 MG tablet take 4 tablets by mouth twice a day 720 tablet 0  . traZODone (DESYREL) 100 MG tablet Take 1 tablet (100 mg total) by mouth 2 (two) times daily. 180 tablet 1   No current facility-administered medications for this encounter.      REVIEW OF SYSTEMS:  A 10 point review of systems is documented in the electronic medical record. This was obtained by the nursing staff. However, I reviewed this with the patient to discuss relevant findings and make appropriate changes.  Pertinent items are noted in HPI.    PHYSICAL EXAM:  vitals were not taken for this visit.  ECOG = 1  0 - Asymptomatic (Fully active, able to carry on all predisease activities without restriction)  1 - Symptomatic but completely ambulatory (Restricted in physically strenuous activity but ambulatory and able to carry out work of a light or sedentary nature. For example, light housework, office work)  2 - Symptomatic, <50% in bed during the day (Ambulatory and capable of all self care but unable to carry out any work activities. Up and about more than 50% of waking hours)  3 - Symptomatic, >50% in bed, but not bedbound (Capable of only limited self-care, confined to bed or chair 50% or more of waking hours)  4 - Bedbound (Completely disabled. Cannot carry on any self-care. Totally confined to bed or chair)  5 - Death   Eustace Pen MM, Creech RH, Tormey DC,  et al. [phone]). "Toxicity and response criteria of the [facility]". [place] Oncol. 5 (6): 649-55  General: Well-developed, in no acute distress HEENT: Normocephalic, atraumatic; oral cavity clear Neck: Supple without any lymphadenopathy Cardiovascular: Regular rate and rhythm Respiratory: Clear to auscultation bilaterally GI: Soft, nontender, normal bowel sounds Extremities: No edema present Neuro: No focal deficits Lymphadenopathy:  Inguinal lymphadenopathy was not identified. Rectal:  Rectal tumor was palpable. Bleeding was not present. This was present approximately 2 cm proximal to the anal verge.    LABORATORY DATA:  Lab Results  Component Value Date   WBC 7.1 04/15/2016   HGB 11.0 (L) 04/15/2016   HCT 33.0 (L) 04/15/2016   MCV 84.8 04/15/2016   PLT 125 (L) 04/15/2016   Lab Results  Component Value Date   NA 146 (H) 04/15/2016   K 3.8 04/15/2016   CL 106 02/09/2016   CO2 29 04/15/2016   Lab Results  Component Value Date   ALT 21 04/15/2016   AST 23 04/15/2016   ALKPHOS 98 04/15/2016   BILITOT 0.90 04/15/2016       RADIOGRAPHY: Ct Chest W Contrast  Result Date: 04/05/2016 CLINICAL DATA:  Newly diagnosed rectal adenocarcinoma.  Staging. Creatinine was obtained on site at [place] at [address]Results: Creatinine 1.2 mg/dL. EXAM: CT CHEST, ABDOMEN, AND PELVIS WITH CONTRAST TECHNIQUE: Multidetector CT imaging of the chest, abdomen and pelvis was performed following the standard protocol during bolus administration of intravenous contrast. CONTRAST:  153m ISOVUE-300 IOPAMIDOL (ISOVUE-300) INJECTION 61% COMPARISON:  Chest CTA on 06/01/2014 and AP CT on 09/30/2012 FINDINGS: CT CHEST FINDINGS Cardiovascular: No acute findings. Stable cardiomegaly and coronary artery calcification. Mediastinum/Lymph Nodes: No masses or pathologically enlarged lymph nodes identified. Lungs/Pleura: No pulmonary infiltrate or mass identified. No  effusion present. Musculoskeletal: No suspicious bone lesions or other significant abnormality. CT ABDOMEN AND PELVIS FINDINGS Hepatobiliary: A 1.4 cm ill-defined low-attenuation lesion is seen in the left hepatic lobe at the junction of segments 2 and 3 on image 54/3. This was not seen on previous study. No other liver lesions are identified. Mild enlargement of caudate and left lobes is seen with mild capsular nodularity, suspicious for hepatic cirrhosis. Pancreas:  No mass or inflammatory changes. Spleen:  Within normal limits in size and appearance. Adrenals/Urinary tract: No masses or hydronephrosis. Tiny sub-cm low-attenuation renal lesions are too small to characterize, but show no significant change in most likely represent tiny cysts. Mild diffuse bladder wall thickening is likely due to chronic bladder outlet obstruction. A 9 mm calculus is seen in the urinary bladder along the left posterior wall. Stomach/Bowel: Known primary rectal carcinoma not well visualized by CT. No evidence of obstruction, inflammatory process, or abnormal fluid collections. Diverticulosis is seen involving the descending and sigmoid colon, however there is no evidence of diverticulitis. Vascular/Lymphatic: A 7 mm left posterior perirectal/presacral lymph node is seen on image 108/series 3. No other pathologically enlarged lymph nodes identified within the abdomen or pelvis. No abdominal aortic aneurysm. Aortic atherosclerosis. Reproductive: Moderately enlarged prostate gland seen with mass effect on bladder base. Other:  None. Musculoskeletal: No suspicious bone lesions identified. Right greater than left hip osteoarthritis. Degenerative lumbar spondylosis, with severe degenerative disc disease at L2-3. IMPRESSION: Known primary rectal carcinoma not well visualized by CT. 7 mm left posterior perirectal/ presacral lymph node ; local lymph node metastasis cannot be excluded. Indeterminate 1.4 cm low-attenuation lesion in left  hepatic lobe. Liver metastasis cannot be excluded. Question hepatic cirrhosis. Recommend abdomen MRI without and with contrast for further evaluation. No evidence of metastatic disease within the thorax. Colonic diverticulosis. No radiographic evidence of diverticulitis. Moderately enlarged prostate and findings of chronic bladder outlet obstruction. 9 mm nonobstructive calculus within urinary bladder. Cholelithiasis.  No radiographic evidence of cholecystitis. Electronically Signed   By: JEarle GellM.D.   On: 04/05/2016 10:46   Ct Abdomen Pelvis W Contrast  Result Date: 04/05/2016 CLINICAL DATA:  Newly diagnosed rectal adenocarcinoma.  Staging. [place] [address]Results: Creatinine 1.2 mg/dL. EXAM: CT CHEST, ABDOMEN, AND PELVIS WITH CONTRAST TECHNIQUE: Multidetector CT imaging of the chest, abdomen and pelvis was performed following the standard protocol during bolus administration of intravenous contrast. CONTRAST:  1219mISOVUE-300 IOPAMIDOL (ISOVUE-300) INJECTION 61% COMPARISON:  Chest CTA on 06/01/2014 and AP CT on 09/30/2012 FINDINGS: CT CHEST FINDINGS Cardiovascular: No acute findings. Stable cardiomegaly and coronary artery calcification. Mediastinum/Lymph  Nodes: No masses or pathologically enlarged lymph nodes identified. Lungs/Pleura: No pulmonary infiltrate or mass identified. No effusion present. Musculoskeletal: No suspicious bone lesions or other significant abnormality. CT ABDOMEN AND PELVIS FINDINGS Hepatobiliary: A 1.4 cm ill-defined low-attenuation lesion is seen in the left hepatic lobe at the junction of segments 2 and 3 on image 54/3. This was not seen on previous study. No other liver lesions are identified. Mild enlargement of caudate and left lobes is seen with mild capsular nodularity, suspicious for hepatic cirrhosis. Pancreas:  No mass or inflammatory changes. Spleen:  Within normal limits in size and appearance.  Adrenals/Urinary tract: No masses or hydronephrosis. Tiny sub-cm low-attenuation renal lesions are too small to characterize, but show no significant change in most likely represent tiny cysts. Mild diffuse bladder wall thickening is likely due to chronic bladder outlet obstruction. A 9 mm calculus is seen in the urinary bladder along the left posterior wall. Stomach/Bowel: Known primary rectal carcinoma not well visualized by CT. No evidence of obstruction, inflammatory process, or abnormal fluid collections. Diverticulosis is seen involving the descending and sigmoid colon, however there is no evidence of diverticulitis. Vascular/Lymphatic: A 7 mm left posterior perirectal/presacral lymph node is seen on image 108/series 3. No other pathologically enlarged lymph nodes identified within the abdomen or pelvis. No abdominal aortic aneurysm. Aortic atherosclerosis. Reproductive: Moderately enlarged prostate gland seen with mass effect on bladder base. Other:  None. Musculoskeletal: No suspicious bone lesions identified. Right greater than left hip osteoarthritis. Degenerative lumbar spondylosis, with severe degenerative disc disease at L2-3. IMPRESSION: Known primary rectal carcinoma not well visualized by CT. 7 mm left posterior perirectal/ presacral lymph node ; local lymph node metastasis cannot be excluded. Indeterminate 1.4 cm low-attenuation lesion in left hepatic lobe. Liver metastasis cannot be excluded. Question hepatic cirrhosis. Recommend abdomen MRI without and with contrast for further evaluation. No evidence of metastatic disease within the thorax. Colonic diverticulosis. No radiographic evidence of diverticulitis. Moderately enlarged prostate and findings of chronic bladder outlet obstruction. 9 mm nonobstructive calculus within urinary bladder. Cholelithiasis.  No radiographic evidence of cholecystitis. Electronically Signed   By: Earle Gell M.D.   On: 04/05/2016 10:46       IMPRESSION:      Adenocarcinoma of rectum ([place])   04/01/2016 Initial Diagnosis    Adenocarcinoma of rectum ([place])      04/01/2016 Procedure    Colonoscopy showed a fungating nonobstructing medium-sized mass in the distal rectum, measured 3 cm in lengths. Diverticulosis in the sigmoid colon.      04/01/2016 Initial Biopsy    Rectal mass biopsy showed adenocarcinoma, grade 2      04/05/2016 Imaging    CT chest, abdomen and pelvis with contrast showed no primary rectal carcinoma is not well visualized, 7 mm left posterior perirectal and presacral lymph nodes, indeterminate 1.4 cm low to attenuation lesion in the left hepatic lobe, question hepatic cirrhosis, recommend abdominal MRI for further evaluation. No other evidence of distant metastasis.       The patient is an appropriate candidate for preoperative chemoradiation treatment. The patient is not fully stage at this time but he appears to have a locally advanced tumor, likely with early adjacent lymph node involvement. There is a lesion within the left hepatic lobe which requires further workup. MRI scan is pending later this week and to complete staging.  I discussed with the patient the rationale of radiation treatment in this setting. I discussed the benefit in terms of local/regional control and  we also discussed how this can aid surgical resection. We also discussed the potential side effects and risks of treatment as well.   All of the patient's questions were answered. The patient wishes to proceed with radiation treatment.   PLAN: The patient will proceed with a simulation in the near future such that we can proceed with treatment planning. I anticipate treating the patient to 50.4 Gy in 5-1/2 weeks. This will correspond to a 3-D conformal technique with daily optical guidance and weekly port films to help ensure accurate localization of the target volume. I anticipate beginning this treatment the week of 05/02/2016. The patient also is seeing  medical oncology today and concurrent chemotherapy will be coordinated.    Pending issues:  MRI scan of the abdomen which will be completed on 04/17/2016     ________________________________   Jodelle Gross, MD, PhD   **Disclaimer: This note was dictated with voice recognition software. Similar sounding words can inadvertently be transcribed and this note may contain transcription errors which may not have been corrected upon publication of note.**

## 2016-04-21 ENCOUNTER — Other Ambulatory Visit: Payer: Self-pay | Admitting: Hematology

## 2016-04-21 MED ORDER — CAPECITABINE 500 MG PO TABS: ORAL_TABLET | ORAL | 0 refills | Status: DC

## 2016-04-23 ENCOUNTER — Other Ambulatory Visit: Payer: Medicaid Other

## 2016-04-25 ENCOUNTER — Other Ambulatory Visit: Payer: Medicaid Other

## 2016-04-25 ENCOUNTER — Telehealth: Payer: Self-pay | Admitting: *Deleted

## 2016-04-25 ENCOUNTER — Ambulatory Visit: Admission: RE | Admit: 2016-04-25 | Payer: Medicare Other | Source: Ambulatory Visit | Admitting: Radiation Oncology

## 2016-04-25 DIAGNOSIS — C20 Malignant neoplasm of rectum: Secondary | ICD-10-CM

## 2016-04-25 MED ORDER — CAPECITABINE 500 MG PO TABS: ORAL_TABLET | ORAL | 0 refills | Status: DC

## 2016-04-25 NOTE — Telephone Encounter (Signed)
Determined that Xeloda script was sent electronically to [facility] to cancel the script and called in to [facility] with message to process, but not fill yet. Most likely will not be needed until 12/11 at earliest. Faxed demographics, insurance information and forwarded copies to oral chemo pharmacist at [facility].

## 2016-04-25 NOTE — Telephone Encounter (Signed)
Received vm call from [place] at [facility] stating that pt's copay for xeloda is zero b/t insurance & medicaid.  Routed to [facility].

## 2016-04-25 NOTE — Telephone Encounter (Signed)
Oncology Nurse Navigator Documentation  Oncology Nurse Navigator Flowsheets 04/25/2016  Navigator Location [facility]  Referral date to RadOnc/MedOnc -  Navigator Encounter Type Telephone  Telephone Outgoing Call;Appt Confirmation/Clarification;Patient Update--he is not able to make his MRI appointment today due to it being in am--his sister is home and he has to get her OOB and dressed and ready for the day. He is able to leave her alone for short intervals once she is up. All appointments need to be after 12:00.  Abnormal Finding Date -  Confirmed Diagnosis Date -  Multidisiplinary Clinic Date -  Patient Visit Type -  Treatment Phase -  Barriers/Navigation Needs Coordination of Care;Family concerns  Education -  Interventions Coordination of Care--MRI pelvis and SIM appointments  Coordination of Care Appts;Radiology--rescheduled MRI for 12/6 at 3:30 and SIM for 12/7 at 3:00pm  Education Method Verbal;Teach-back  Support Groups/Services -  Acuity -  Time Spent with Patient 86  Made Dr. Lisbeth Renshaw aware that MRI pelvis was rescheduled and confirmed he prefers to have MRI before his SIM is done. Called SIM and cancelled today and rescheduled for day after his MRI.

## 2016-04-28 ENCOUNTER — Ambulatory Visit (INDEPENDENT_AMBULATORY_CARE_PROVIDER_SITE_OTHER): Payer: Medicare Other | Admitting: Physician Assistant

## 2016-04-28 ENCOUNTER — Encounter: Payer: Self-pay | Admitting: Physician Assistant

## 2016-04-28 VITALS — BP 104/60 | HR 50 | Ht 69.0 in | Wt 209.2 lb

## 2016-04-28 DIAGNOSIS — Z79899 Other long term (current) drug therapy: Secondary | ICD-10-CM | POA: Diagnosis not present

## 2016-04-28 DIAGNOSIS — I5023 Acute on chronic systolic (congestive) heart failure: Secondary | ICD-10-CM

## 2016-04-28 DIAGNOSIS — I255 Ischemic cardiomyopathy: Secondary | ICD-10-CM | POA: Diagnosis not present

## 2016-04-28 DIAGNOSIS — I2510 Atherosclerotic heart disease of native coronary artery without angina pectoris: Secondary | ICD-10-CM | POA: Diagnosis not present

## 2016-04-28 MED ORDER — NITROGLYCERIN 0.4 MG SL SUBL: 0.4000 mg | SUBLINGUAL_TABLET | SUBLINGUAL | 3 refills | Status: DC | PRN

## 2016-04-28 MED ORDER — METOLAZONE 2.5 MG PO TABS: ORAL_TABLET | ORAL | 3 refills | Status: DC

## 2016-04-28 MED FILL — metOLazone 2.5 MG TABS: 2.5 | 28 days supply | Qty: 4 | Fill #0 | Status: TO

## 2016-04-28 MED FILL — NITROGLYCERIN 0.4 MG TAB SL: 0.4 | 20 days supply | Qty: 25 | Fill #0

## 2016-04-28 NOTE — Progress Notes (Signed)
Cardiology Office Note   Date:  04/28/2016   ID:  KIN TOTTY, DOB Jul 09, 1950, MRN [id number]  PCP:  Evette Doffing, MD  Cardiologist:  Dr Percival Spanish 03/31/2016 Rosaria Ferries, PA-C   Chief Complaint  Patient presents with  . Follow-up    no chest pain, has been getting tired easily, has some shortness of breath, occassional edema in feet and legs, no lightheaded or dizziness, rarely has pain or cramping in legs    History of Present Illness: Patrick [name] is a 65 y.o. male with a history of CAD w/ 99% small RCA and 70% mod OM1 at cath 0000000, chronic systolic CHF w/ EF [id number] at cath 25% by echo, HTN, HLD, Pre-DM.   11/02, seen by [facility] and doing well.  11/03, seen by oncology and dx rectal CA. Had colonoscopy and dx adenocarcinoma, grade 2. CT w/ 2 small nodes, MRI performed, results below. F/u with Oncology MD scheduled.   Patrick [name] presents for cardiology follow up.  He gets tired easily. He can walk a flight of steps, he does not have to stop, but is winded at the top. His weight is up a little today, he has not noticed any LE edema upon waking but gets it during the day. He is weighing at home and his weight is trending up slowly there also.    He is eating a lot of frozen dinners, does not cook much (if at all). He is reading the labels, trying to be aware of hidden sodium. He denies orthopnea or PND recently. He had 1 episode of PND, but that was not recent and may have been before he was being treated for CHF.   He has not had chest pain. He is compliant with his medications. He does not have SL NTG. He is willing to increase his activity, but does not want to attend cardiac rehab.   He is to get chemo and XRT for about 6 months, take a break, and then consider surgery.  Code status was discussed with the patient. He is clear in his wishes. He wants everything done. Wants to be aggressive about his care. Would be willing to consider a defibrillator.    Past Medical History:    Diagnosis Date  . Arthritis    "right hand; right knee" (06/19/2014)  . CAD (coronary artery disease)    2v CAD with subtotal occlusion of small co-dominant RCA and borderline lesion in moderate-sized OM-1  . CHF (congestive heart failure) (HCC)    Preserved EF  . Degenerative joint disease of knee, right   . Enlarged prostate   . Hyperlipidemia   . Hypertension   . Pneumonia 05/2014  . Prediabetes 10/03/2014  . Scrotal edema 04/03/2015  . SMALL BOWEL OBSTRUCTION, HX OF 08/21/2007   Annotation: with narrowing in the ileocecal region Qualifier: Diagnosis of  By: Hassell Done FNP, Tori Milks      Past Surgical History:  Procedure Laterality Date  . CARDIAC CATHETERIZATION N/A 02/16/2016   Procedure: Right/Left Heart Cath and Coronary Angiography;  Surgeon: Jolaine Artist, MD;  Location: [facility];  Service: Cardiovascular;  Laterality: N/A;  . COLONOSCOPY N/A 04/01/2016   Procedure: COLONOSCOPY;  Surgeon: Leighton Ruff, MD;  Location: [facility];  Service: Endoscopy;  Laterality: N/A;  . INGUINAL HERNIA REPAIR Left 1990's    Medication Sig  . acetaminophen (TYLENOL) 500 MG tablet Take 1,000 mg by mouth every 6 (six) hours as needed for mild pain.  Marland Kitchen  Ascorbic Acid (VITAMIN C) 1000 MG tablet Take 2,000 mg by mouth 2 (two) times daily.  Marland Kitchen aspirin EC 81 MG EC tablet Take 1 tablet (81 mg total) by mouth daily.  Derrill Memo ON 05/09/2016] capecitabine (XELODA) 500 MG tablet Take #4 tab in morning and #3 tab in evening on days of radiation-Monday-Friday  . carvedilol (COREG) 25 MG tablet Take 1 tablet (25 mg total) by mouth 2 (two) times daily.  . ferrous sulfate 325 (65 FE) MG tablet take 1 tablet by mouth once daily WITH BREAKFAST  . finasteride (PROSCAR) 5 MG tablet Take 1 tablet (5 mg total) by mouth daily.  Marland Kitchen losartan (COZAAR) 100 MG tablet Take 1 tablet (100 mg total) by mouth daily.  Marland Kitchen lovastatin (MEVACOR) 20 MG tablet Take 1 tablet (20 mg total) by mouth at bedtime.  . mirtazapine  (REMERON SOL-TAB) 15 MG disintegrating tablet place 1 tablet ON TONGUE at bedtime (Patient taking differently: Take 15 mg by mouth at bedtime. place 1 tablet ON TONGUE at bedtime)  . Multiple Vitamins-Minerals (MULTIVITAMIN WITH MINERALS) tablet Take 1 tablet by mouth daily.  Marland Kitchen omega-3 acid ethyl esters (LOVAZA) 1 g capsule take 2 capsules by mouth twice a day  . oxyCODONE-acetaminophen (PERCOCET/ROXICET) 5-325 MG tablet Take 1 tablet by mouth every 8 (eight) hours as needed for severe pain.  . potassium chloride SA (K-DUR,KLOR-CON) 20 MEQ tablet TAKE 3 TABLET BY MOUTH EVERY DAY (Patient taking differently: Take 20 mEq by mouth 3 (three) times daily. )  . tamsulosin (FLOMAX) 0.4 MG CAPS capsule Take 0.4 mg by mouth daily.   Marland Kitchen torsemide (DEMADEX) 20 MG tablet take 4 tablets by mouth twice a day  . traZODone (DESYREL) 100 MG tablet Take 1 tablet (100 mg total) by mouth 2 (two) times daily.   No current facility-administered medications for this visit.     Allergies:   Patient has no known allergies.    Social History:  The patient  reports that he has quit smoking. His smoking use included Pipe and Cigars. He quit after 5.00 years of use. He has never used smokeless tobacco. He reports that he does not drink alcohol or use drugs.   Family History:  The patient's family history includes Arthritis in his sister; COPD in his sister; Cancer (age of onset: 47) in his father; Dementia in his father; Diabetes in his mother; Edema in his sister; Hypertension in his mother; Stroke in his mother.    ROS:  Please see the history of present illness. All other systems are reviewed and negative.    PHYSICAL EXAM: VS:  BP 104/60 (BP Location: Right Arm, Patient Position: Sitting, Cuff Size: Normal)   Pulse (!) 50   Ht 5\' 9"  (1.753 m)   Wt 209 lb 4 oz (94.9 kg)   SpO2 97%   BMI 30.90 kg/m  , BMI Body mass index is 30.9 kg/m. GEN: Well nourished, well developed, male in no acute distress  HEENT: normal  for age  Neck: JVD 9 cm, +HJR, no carotid bruit, no masses Cardiac: RRR; no murmur, no rubs, or gallops Respiratory: decreased BS bases bilaterally, normal work of breathing GI: soft, nontender, nondistended, + BS MS: no deformity or atrophy; trace edema; distal pulses are 2+ in all 4 extremities   Skin: warm and dry, no rash Neuro:  Strength and sensation are intact Psych: euthymic mood, full affect   EKG:  EKG is not ordered today.  CATH: 02/16/2016 Assessment: 1. 2v CAD with  small co-dominant RCA 99% and moderate-sized OM-1 70% 2. 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 Normal cardiac output Plan/Discussion: Continue aggressive medical therapy. Consider switching losartan to Entresto and sleep study.  ECHO: 05/2015 - Left ventricle: Diffuse hypokinesis worse in the inferior wall.   The cavity size was moderately dilated. Wall thickness was   normal. Systolic function was severely reduced. The estimated   ejection fraction was in the range of 25% to 30%. - Mitral valve: There was mild regurgitation. - Left atrium: The atrium was moderately to severely dilated. - Atrial septum: No defect or patent foramen ovale was identified. - Pulmonary arteries: PA peak pressure: 67 mm Hg (S).  ABDOMINAL MRI: 04/17/2016 IMPRESSION: 1. Morphologic features of liver compatible cirrhosis. 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 2. There is a complex lesion within the left kidney which is only partially visualized on this study which was tailored to assess the liver. When compared with study from 04/04/2016 this is favored to represent a small solid enhancing neoplasm, i.e. renal cell carcinoma. Urologic consultation is recommended. 3. The indeterminate lesion within the left lobe of liver described on recent CT remains indeterminate. This large reflects motion artifact which  degrades the post-contrast sequences. There are no specific findings associated with this lesion to suggest [place] however correlation with AFP levels would be advised. Additionally, in light of the patient's increased risk for hepatoma and liver metastases, followup imaging with repeat contrast enhanced MRI abdomen in 3-6 months is advised to ensure stability of this lesion.  Recent Labs: 02/09/2016: TSH 2.04 04/15/2016: ALT 21; BUN 10.6; Creatinine 1.2; HGB 11.0; Platelets 125; Potassium 3.8; Sodium 146    Lipid Panel    Component Value Date/Time   CHOL 78 08/12/2014 1220   TRIG 70 08/12/2014 1220   HDL 32 (L) 08/12/2014 1220   CHOLHDL 2.4 08/12/2014 1220   VLDL 14 08/12/2014 1220   LDLCALC 32 08/12/2014 1220   LDLDIRECT 23 12/03/2013 1206     Wt Readings from Last 3 Encounters:  04/28/16 209 lb 4 oz (94.9 kg)  04/15/16 207 lb 14.4 oz (94.3 kg)  03/31/16 205 lb (93 kg)     Other studies Reviewed: Additional studies/ records that were reviewed today include: Office notes, hospital records and testing.  ASSESSMENT AND PLAN:  1.  ICM: His EF was 25% at echo, 20% at cath 01/2016. Next month, he will get a repeat echo. If his EF has not improved, he will need to be started on Entresto and be referred to EP to consider an ICD. His QRS duration was 178 ms at last ECG. He may benefit from a BiV device.  2. Acute on Chronic systolic CHF: His volume and his weight are up a little. His BP is borderline, but he is on good doses of an ARB and a BB. Will add metolazone 2.5 mg tomorrow and weekly as needed for weight gain or edema. He is to take 2 extra K+ tabs with it. Ck BMET today. Cut the losartan in half when he takes the metolazone.   3. CAD: He needs to work on his fitness. He is to start walking by cardiac rehab guidelines and increase as tolerated. Any strength he can build will help keep his strength up during the cancer treatment. Add NTG  4. Code status: Pt was clear he wants to  be a full code.   5. Rectal CA:  He wants to fight it. Chemo and XRT will be challenging.    Current medicines are reviewed at length with the patient today.  The patient does not have concerns regarding medicines.  The following changes have been made:  Add metolazone prn   Labs/ tests ordered today include:   Orders Placed This Encounter  Procedures  . Basic metabolic panel  . ECHOCARDIOGRAM COMPLETE     Disposition:   FU with Dr Percival Spanish  Signed, Rosaria Ferries, PA-C  04/28/2016 6:22 PM    Patrick [name] Phone: [phone]; Fax: [phone]  This note was written with the assistance of speech recognition software. Please excuse any transcriptional errors.

## 2016-04-28 NOTE — Patient Instructions (Addendum)
Rhonda Barrett, PA-C, has recommended making the following medication changes: 1. START Metolazone 2.5 mg - take 1 tablet once a week as needed for extra edema/weight gain  >>TAKE ONE TABLET TOMORROW 2. TAKE 2 tablets of Potassium whenever you take Metolazone 3. TAKE 0.5 tablet of Losartan whenever you take Metolazone 4. TAKE Nitroglycerin (NTG) 0.4 mg - place 1 tablet underneath your tongue every 5 minutes as needed for chest pain max 3 doses  >>Please SEEK MEDICAL ATTENTION if chest pain does NOT go away after 3rd dose  Your physician recommends that you weigh, daily, at the same time every day, and in the same amount of clothing. Please record your daily weights and bring it to your next appointment.  Your physician discussed the importance of regular exercise and recommended that you start or continue a regular exercise program for good health. Start by walking 5 minutes twice a day. Increase by 1 minute a day up to 15 minutes twice a day and then switch to 30 minutes daily. TARGET HEART RATE - 110 bpm  Your physician has requested that you have an echocardiogram. Echocardiography is a painless test that uses sound waves to create images of your heart. It provides your doctor with information about the size and shape of your heart and how well your heart's chambers and valves are working. This procedure takes approximately one hour. There are no restrictions for this procedure. This will be done at our [facility] location - [address], [address].  Your physician recommends that you return for lab work TODAY.  Please look for frozen dinners with 500 mg of sodium or less.  Suanne Marker recommends that you schedule a follow-up appointment after the echocardiogram with Dr Percival Spanish.     Nitroglycerin sublingual tablets What is this medicine? NITROGLYCERIN (nye troe GLI ser in) is a type of vasodilator. It relaxes blood vessels, increasing the blood and oxygen supply to your heart. This  medicine is used to relieve chest pain caused by angina. It is also used to prevent chest pain before activities like climbing stairs, going outdoors in cold weather, or sexual activity. This medicine may be used for other purposes; ask your health care provider or pharmacist if you have questions. COMMON BRAND NAME(S): Nitroquick, Nitrostat, Nitrotab What should I tell my health care provider before I take this medicine? They need to know if you have any of these conditions: -anemia -head injury, recent stroke, or bleeding in the brain -liver disease -previous heart attack -an unusual or allergic reaction to nitroglycerin, other medicines, foods, dyes, or preservatives -pregnant or trying to get pregnant -breast-feeding How should I use this medicine? Take this medicine by mouth as needed. At the first sign of an angina attack (chest pain or tightness) place one tablet under your tongue. You can also take this medicine 5 to 10 minutes before an event likely to produce chest pain. Follow the directions on the prescription label. Let the tablet dissolve under the tongue. Do not swallow whole. Replace the dose if you accidentally swallow it. It will help if your mouth is not dry. Saliva around the tablet will help it to dissolve more quickly. Do not eat or drink, smoke or chew tobacco while a tablet is dissolving. If you are not better within 5 minutes after taking ONE dose of nitroglycerin, call 9-1-1 immediately to seek emergency medical care. Do not take more than 3 nitroglycerin tablets over 15 minutes. If you take this medicine often to  relieve symptoms of angina, your doctor or health care professional may provide you with different instructions to manage your symptoms. If symptoms do not go away after following these instructions, it is important to call 9-1-1 immediately. Do not take more than 3 nitroglycerin tablets over 15 minutes. Talk to your pediatrician regarding the use of this medicine  in children. Special care may be needed. Overdosage: If you think you have taken too much of this medicine contact a poison control center or emergency room at once. NOTE: This medicine is only for you. Do not share this medicine with others. What if I miss a dose? This does not apply. This medicine is only used as needed. What may interact with this medicine? Do not take this medicine with any of the following medications: -certain migraine medicines like ergotamine and dihydroergotamine (DHE) -medicines used to treat erectile dysfunction like sildenafil, tadalafil, and vardenafil -riociguat This medicine may also interact with the following medications: -alteplase -aspirin -heparin -medicines for high blood pressure -medicines for mental depression -other medicines used to treat angina -phenothiazines like chlorpromazine, mesoridazine, prochlorperazine, thioridazine This list may not describe all possible interactions. Give your health care provider a list of all the medicines, herbs, non-prescription drugs, or dietary supplements you use. Also tell them if you smoke, drink alcohol, or use illegal drugs. Some items may interact with your medicine. What should I watch for while using this medicine? Tell your doctor or health care professional if you feel your medicine is no longer working. Keep this medicine with you at all times. Sit or lie down when you take your medicine to prevent falling if you feel dizzy or faint after using it. Try to remain calm. This will help you to feel better faster. If you feel dizzy, take several deep breaths and lie down with your feet propped up, or bend forward with your head resting between your knees. You may get drowsy or dizzy. Do not drive, use machinery, or do anything that needs mental alertness until you know how this drug affects you. Do not stand or sit up quickly, especially if you are an older patient. This reduces the risk of dizzy or fainting  spells. Alcohol can make you more drowsy and dizzy. Avoid alcoholic drinks. Do not treat yourself for coughs, colds, or pain while you are taking this medicine without asking your doctor or health care professional for advice. Some ingredients may increase your blood pressure. What side effects may I notice from receiving this medicine? Side effects that you should report to your doctor or health care professional as soon as possible: -blurred vision -dry mouth -skin rash -sweating -the feeling of extreme pressure in the head -unusually weak or tired Side effects that usually do not require medical attention (report to your doctor or health care professional if they continue or are bothersome): -flushing of the face or neck -headache -irregular heartbeat, palpitations -nausea, vomiting This list may not describe all possible side effects. Call your doctor for medical advice about side effects. You may report side effects to FDA at 1-800-FDA-1088. Where should I keep my medicine? Keep out of the reach of children. Store at room temperature between 20 and 25 degrees C (68 and 77 degrees F). Store in [employer]. Protect from light and moisture. Keep tightly closed. Throw away any unused medicine after the expiration date. NOTE: This sheet is a summary. It may not cover all possible information. If you have questions about this medicine, talk to your  doctor, pharmacist, or health care provider.  2017 Elsevier/Gold Standard (2013-03-14 17:57:36)

## 2016-04-29 ENCOUNTER — Telehealth: Payer: Self-pay | Admitting: Cardiology

## 2016-04-29 ENCOUNTER — Other Ambulatory Visit: Payer: Self-pay | Admitting: *Deleted

## 2016-04-29 LAB — BASIC METABOLIC PANEL
BUN: 13 mg/dL (ref 7–25)
CHLORIDE: 105 mmol/L (ref 98–110)
CO2: 29 mmol/L (ref 20–31)
Calcium: 8.5 mg/dL — ABNORMAL LOW (ref 8.6–10.3)
Creat: 1.49 mg/dL — ABNORMAL HIGH (ref 0.70–1.25)
Glucose, Bld: 93 mg/dL (ref 65–99)
POTASSIUM: 4 mmol/L (ref 3.5–5.3)
SODIUM: 143 mmol/L (ref 135–146)

## 2016-04-29 MED ORDER — METOLAZONE 2.5 MG PO TABS: ORAL_TABLET | ORAL | 3 refills | Status: DC

## 2016-04-29 MED ORDER — NITROGLYCERIN 0.4 MG SL SUBL: 0.4000 mg | SUBLINGUAL_TABLET | SUBLINGUAL | 3 refills | Status: DC | PRN

## 2016-04-29 NOTE — Telephone Encounter (Signed)
New message  Pt is calling referring to RX called in to wrong pharmacy  RX needs to be sent to [facility] was accidentally sent to [facility]   metolazone 2.5mg  nitroglycerin 0.4mg  sl  Please follow up

## 2016-05-01 ENCOUNTER — Other Ambulatory Visit: Payer: Medicaid Other

## 2016-05-02 MED FILL — CAPECITABINE 500 MG TABLET: 500 | 42 days supply | Qty: 210 | Fill #0

## 2016-05-04 ENCOUNTER — Other Ambulatory Visit: Payer: Self-pay | Admitting: *Deleted

## 2016-05-04 ENCOUNTER — Ambulatory Visit
Admission: RE | Admit: 2016-05-04 | Discharge: 2016-05-04 | Disposition: A | Discharge status: Home / Self Care | Payer: Medicare Other | Source: Ambulatory Visit | Attending: General Surgery | Admitting: General Surgery

## 2016-05-04 DIAGNOSIS — C20 Malignant neoplasm of rectum: Secondary | ICD-10-CM | POA: Diagnosis not present

## 2016-05-04 DIAGNOSIS — R7989 Other specified abnormal findings of blood chemistry: Secondary | ICD-10-CM

## 2016-05-04 DIAGNOSIS — Z79899 Other long term (current) drug therapy: Secondary | ICD-10-CM

## 2016-05-04 MED ORDER — GADOBENATE DIMEGLUMINE 529 MG/ML IV SOLN
20.0000 mL | Freq: Once | INTRAVENOUS | Status: AC | PRN
  Administered 2016-05-04: 20 mL via INTRAVENOUS

## 2016-05-05 ENCOUNTER — Encounter: Payer: Self-pay | Admitting: *Deleted

## 2016-05-05 ENCOUNTER — Other Ambulatory Visit: Payer: Medicare Other

## 2016-05-05 ENCOUNTER — Ambulatory Visit
Admission: RE | Admit: 2016-05-05 | Discharge: 2016-05-05 | Disposition: A | Discharge status: Home / Self Care | Payer: Medicare Other | Source: Ambulatory Visit | Attending: Radiation Oncology | Admitting: Radiation Oncology

## 2016-05-05 ENCOUNTER — Encounter: Payer: Self-pay | Admitting: Hematology

## 2016-05-05 ENCOUNTER — Ambulatory Visit (HOSPITAL_BASED_OUTPATIENT_CLINIC_OR_DEPARTMENT_OTHER): Payer: Medicare Other | Admitting: Hematology

## 2016-05-05 VITALS — BP 129/61 | HR 62 | Temp 97.6°F | Resp 17 | Ht 69.0 in | Wt 197.8 lb

## 2016-05-05 DIAGNOSIS — I509 Heart failure, unspecified: Secondary | ICD-10-CM | POA: Diagnosis not present

## 2016-05-05 DIAGNOSIS — N400 Enlarged prostate without lower urinary tract symptoms: Secondary | ICD-10-CM | POA: Diagnosis not present

## 2016-05-05 DIAGNOSIS — I5022 Chronic systolic (congestive) heart failure: Secondary | ICD-10-CM | POA: Diagnosis not present

## 2016-05-05 DIAGNOSIS — Z87891 Personal history of nicotine dependence: Secondary | ICD-10-CM | POA: Diagnosis not present

## 2016-05-05 DIAGNOSIS — C20 Malignant neoplasm of rectum: Secondary | ICD-10-CM

## 2016-05-05 DIAGNOSIS — K7689 Other specified diseases of liver: Secondary | ICD-10-CM

## 2016-05-05 DIAGNOSIS — Z8249 Family history of ischemic heart disease and other diseases of the circulatory system: Secondary | ICD-10-CM | POA: Diagnosis not present

## 2016-05-05 DIAGNOSIS — Z7982 Long term (current) use of aspirin: Secondary | ICD-10-CM | POA: Diagnosis not present

## 2016-05-05 DIAGNOSIS — R7303 Prediabetes: Secondary | ICD-10-CM | POA: Diagnosis not present

## 2016-05-05 DIAGNOSIS — I2510 Atherosclerotic heart disease of native coronary artery without angina pectoris: Secondary | ICD-10-CM | POA: Diagnosis not present

## 2016-05-05 DIAGNOSIS — Z809 Family history of malignant neoplasm, unspecified: Secondary | ICD-10-CM | POA: Diagnosis not present

## 2016-05-05 DIAGNOSIS — K5730 Diverticulosis of large intestine without perforation or abscess without bleeding: Secondary | ICD-10-CM | POA: Diagnosis not present

## 2016-05-05 DIAGNOSIS — Z79899 Other long term (current) drug therapy: Secondary | ICD-10-CM | POA: Diagnosis not present

## 2016-05-05 DIAGNOSIS — Z823 Family history of stroke: Secondary | ICD-10-CM | POA: Diagnosis not present

## 2016-05-05 DIAGNOSIS — I10 Essential (primary) hypertension: Secondary | ICD-10-CM

## 2016-05-05 DIAGNOSIS — Z833 Family history of diabetes mellitus: Secondary | ICD-10-CM | POA: Diagnosis not present

## 2016-05-05 DIAGNOSIS — D509 Iron deficiency anemia, unspecified: Secondary | ICD-10-CM | POA: Diagnosis not present

## 2016-05-05 DIAGNOSIS — Z510 Encounter for antineoplastic radiation therapy: Secondary | ICD-10-CM | POA: Diagnosis not present

## 2016-05-05 DIAGNOSIS — I110 Hypertensive heart disease with heart failure: Secondary | ICD-10-CM | POA: Diagnosis not present

## 2016-05-05 DIAGNOSIS — M1990 Unspecified osteoarthritis, unspecified site: Secondary | ICD-10-CM | POA: Diagnosis not present

## 2016-05-05 DIAGNOSIS — Z825 Family history of asthma and other chronic lower respiratory diseases: Secondary | ICD-10-CM | POA: Diagnosis not present

## 2016-05-05 DIAGNOSIS — E785 Hyperlipidemia, unspecified: Secondary | ICD-10-CM | POA: Diagnosis not present

## 2016-05-05 DIAGNOSIS — K769 Liver disease, unspecified: Secondary | ICD-10-CM | POA: Diagnosis not present

## 2016-05-05 NOTE — Progress Notes (Signed)
Oncology Nurse Navigator Documentation  Oncology Nurse Navigator Flowsheets 05/05/2016  Navigator Location [facility]  Referral date to RadOnc/MedOnc -  Navigator Encounter Type Other--note given to education nurse to forward to patient today  Telephone -  Abnormal Finding Date -  Confirmed Diagnosis Date -  Multidisiplinary Clinic Date -  Patient Visit Type -  Treatment Phase -  Barriers/Navigation Needs Coordination of Care  Education -  Interventions Coordination of Care--note to patient that MRI pelvis shows stage III and he will need RT/Xeloda before surgery. Informed him Xeloda ready at [place] with no copay. Provided map how to get to pharmacy.  Coordination of Care -  Education Method Written  Support Groups/Services -  Acuity -  Time Spent with Patient -

## 2016-05-05 NOTE — Progress Notes (Signed)
[place]  [phone] Fax:([phone]  Clinic Follow up Note   Patient Care Team: Sela Hua, MD as PCP - General (Family Medicine) Leighton Ruff, MD as Consulting Physician (General Surgery) Kyung Rudd, MD as Consulting Physician (Radiation Oncology) Truitt Merle, MD as Consulting Physician (Hematology) Tania Ade, RN as Registered Nurse 05/05/2016  CHIEF COMPLAINTS:  Follow up rectal cancer  Oncology History   Adenocarcinoma of rectum [facility])   Staging form: Colon and Rectum, AJCC 7th Edition   - Clinical stage from 04/01/2016: T2, N1 - Signed by Truitt Merle, MD on 05/05/2016      Adenocarcinoma of rectum ([place])   04/01/2016 Initial Diagnosis    Adenocarcinoma of rectum ([place])      04/01/2016 Procedure    Colonoscopy showed a fungating nonobstructing medium-sized mass in the distal rectum, measured 3 cm in lengths. Diverticulosis in the sigmoid colon.      04/01/2016 Initial Biopsy    Rectal mass biopsy showed adenocarcinoma, grade 2      04/05/2016 Imaging    CT chest, abdomen and pelvis with contrast showed no primary rectal carcinoma is not well visualized, 7 mm left posterior perirectal and presacral lymph nodes, indeterminate 1.4 cm low to attenuation lesion in the left hepatic lobe, question hepatic cirrhosis, recommend abdominal MRI for further evaluation. No other evidence of distant metastasis.       HISTORY OF PRESENTING ILLNESS (04/15/2016):  Patrick [name] 65 y.o. male is here because of his recent diagnosed rectal cancer. He presents to our multidisciplinary GI clinic by himself today.  He has had intermittent rectal bleeding for a few years, which she contributes to hemorrhoids, slightly worse lately. He used to have regular bowel movement twice a day, which has been more irregular lately, and occasional loose or watery bowel movement. No significant rectal pain. He was seen by his urologist for routine follow-up of his BPH, and on  the rectal exam rectal mass was palpated, and he was referred to colorectal surgeon Dr. Marcello Moores. Colonoscopy on 04/01/2016 showed a fungating nonobstructing medium-sized mass in the distal rectum, 3 cm in length. Biopsy showed adenocarcinoma grade 2. CT CAP was negative distant metastasis, except a 1.4cm indeterminate lesion in the liver.  Review of systems also reviewed slightly decreased appetite, no signal weight loss. No other pain, nausea, or other symptoms. He has significant cardiomyopathy, nonischemic, has orthopnea, mild, able to function and tolerating routine activity at home. He is the primary caregiver of his sister, who is continuous nursing home. He is single, no children. Retired. No family history of colon cancer.  CURRENT THERAPY: Concurrent chemotherapy and irradiation with Xeloda 2069m in am and 15059mpm, starting next week  INTERIM HISTORY: Mr. RoLanuzaeturns for follow-up and CT simulation. He is scheduled to start concurrent chemoradiation next week. His abdominal and pelvis MRI was rescheduled a few times due to his limited availability, he is his sisters caregiver. He is doing well overall, occasionally has rectal bleeding, no significant pain or other complains.    MEDICAL HISTORY:  Past Medical History:  Diagnosis Date  . Arthritis    "right hand; right knee" (06/19/2014)  . CAD (coronary artery disease)    2v CAD with subtotal occlusion of small co-dominant RCA and borderline lesion in moderate-sized OM-1  . CHF (congestive heart failure) (HCC)    Preserved EF  . Degenerative joint disease of knee, right   . Enlarged prostate   . Hyperlipidemia   .  Hypertension   . Pneumonia 05/2014  . Prediabetes 10/03/2014  . Scrotal edema 04/03/2015  . SMALL BOWEL OBSTRUCTION, HX OF 08/21/2007   Annotation: with narrowing in the ileocecal region Qualifier: Diagnosis of  By: Hassell Done FNP, Tori Milks      SURGICAL HISTORY: Past Surgical History:  Procedure Laterality Date  . CARDIAC  CATHETERIZATION N/A 02/16/2016   Procedure: Right/Left Heart Cath and Coronary Angiography;  Surgeon: Jolaine Artist, MD;  Location: [facility];  Service: Cardiovascular;  Laterality: N/A;  . COLONOSCOPY N/A 04/01/2016   Procedure: COLONOSCOPY;  Surgeon: Leighton Ruff, MD;  Location: [facility];  Service: Endoscopy;  Laterality: N/A;  . INGUINAL HERNIA REPAIR Left 1990's    SOCIAL HISTORY: Social History   Social History  . Marital status: Single    Spouse name: N/A  . Number of children: N/A  . Years of education: N/A   Occupational History  . Not on file.   Social History Main Topics  . Smoking status: Former Smoker    Years: 5.00    Types: Pipe, Landscape architect  . Smokeless tobacco: Never Used     Comment: 06/19/2014 "stopped smoking in ~ 2014; used to smoke a pipe or cigar a couple times/month"  . Alcohol use No  . Drug use: No  . Sexual activity: No   Other Topics Concern  . Not on file   Social History Narrative   Single, lives w/his sister of whom he is caregiver       FAMILY HISTORY: Family History  Problem Relation Age of Onset  . Hypertension Mother   . Diabetes Mother   . Stroke Mother   . Cancer Father 68    Prostate  . Dementia Father   . COPD Sister   . Arthritis Sister   . Edema Sister     ALLERGIES:  has No Known Allergies.  MEDICATIONS:  Current Outpatient Prescriptions  Medication Sig Dispense Refill  . acetaminophen (TYLENOL) 500 MG tablet Take 1,000 mg by mouth every 6 (six) hours as needed for mild pain.    . Ascorbic Acid (VITAMIN C) 1000 MG tablet Take 2,000 mg by mouth 2 (two) times daily.    Marland Kitchen aspirin EC 81 MG EC tablet Take 1 tablet (81 mg total) by mouth daily. 30 tablet 0  . [START ON 05/09/2016] capecitabine (XELODA) 500 MG tablet Take #4 tab in morning and #3 tab in evening on days of radiation-Monday-Friday 210 tablet 0  . carvedilol (COREG) 25 MG tablet Take 1 tablet (25 mg total) by mouth 2 (two) times daily. 180 tablet 3    . ferrous sulfate 325 (65 FE) MG tablet take 1 tablet by mouth once daily WITH BREAKFAST 30 tablet 3  . finasteride (PROSCAR) 5 MG tablet Take 1 tablet (5 mg total) by mouth daily. 90 tablet 1  . losartan (COZAAR) 100 MG tablet Take 1 tablet (100 mg total) by mouth daily. 30 tablet 11  . lovastatin (MEVACOR) 20 MG tablet Take 1 tablet (20 mg total) by mouth at bedtime. 90 tablet 1  . metolazone (ZAROXOLYN) 2.5 MG tablet Take 1 tablet (2.5 mg total) by mouth once a week as needed for extra edema/weight gain. 30 tablet 3  . mirtazapine (REMERON SOL-TAB) 15 MG disintegrating tablet place 1 tablet ON TONGUE at bedtime (Patient taking differently: Take 15 mg by mouth at bedtime. place 1 tablet ON TONGUE at bedtime) 90 tablet 0  . Multiple Vitamins-Minerals (MULTIVITAMIN WITH MINERALS) tablet Take 1 tablet  by mouth daily.    . nitroGLYCERIN (NITROSTAT) 0.4 MG SL tablet Place 1 tablet (0.4 mg total) under the tongue every 5 (five) minutes as needed for chest pain. 25 tablet 3  . omega-3 acid ethyl esters (LOVAZA) 1 g capsule take 2 capsules by mouth twice a day 360 capsule 1  . oxyCODONE-acetaminophen (PERCOCET/ROXICET) 5-325 MG tablet Take 1 tablet by mouth every 8 (eight) hours as needed for severe pain. 90 tablet 0  . potassium chloride SA (K-DUR,KLOR-CON) 20 MEQ tablet TAKE 3 TABLET BY MOUTH EVERY DAY (Patient taking differently: Take 20 mEq by mouth 3 (three) times daily. ) 270 tablet 3  . tamsulosin (FLOMAX) 0.4 MG CAPS capsule Take 0.4 mg by mouth daily.     Marland Kitchen torsemide (DEMADEX) 20 MG tablet take 4 tablets by mouth twice a day 720 tablet 0  . traZODone (DESYREL) 100 MG tablet Take 1 tablet (100 mg total) by mouth 2 (two) times daily. 180 tablet 1   No current facility-administered medications for this visit.     REVIEW OF SYSTEMS:   Constitutional: Denies fevers, chills or abnormal night sweats Eyes: Denies blurriness of vision, double vision or watery eyes Ears, nose, mouth, throat, and  face: Denies mucositis or sore throat Respiratory: Denies cough, dyspnea or wheezes Cardiovascular: Denies palpitation, chest discomfort or lower extremity swelling Gastrointestinal:  Denies nausea, heartburn or change in bowel habits Skin: Denies abnormal skin rashes Lymphatics: Denies new lymphadenopathy or easy bruising Neurological:Denies numbness, tingling or new weaknesses Behavioral/Psych: Mood is stable, no new changes  All other systems were reviewed with the patient and are negative.  PHYSICAL EXAMINATION: ECOG PERFORMANCE STATUS: 1 - Symptomatic but completely ambulatory  Vitals:   05/05/16 1615  BP: 129/61  Pulse: 62  Resp: 17  Temp: 97.6 F (36.4 C)   Filed Weights   05/05/16 1615  Weight: 197 lb 12.8 oz (89.7 kg)    GENERAL:alert, no distress and comfortable SKIN: skin color, texture, turgor are normal, no rashes or significant lesions EYES: normal, conjunctiva are pink and non-injected, sclera clear OROPHARYNX:no exudate, no erythema and lips, buccal mucosa, and tongue normal  NECK: supple, thyroid normal size, non-tender, without nodularity LYMPH:  no palpable lymphadenopathy in the cervical, axillary or inguinal LUNGS: clear to auscultation and percussion with normal breathing effort HEART: regular rate & rhythm and no murmurs and no lower extremity edema ABDOMEN:abdomen soft, non-tender and normal bowel sounds, rectal exam showed a firm mass in the low rectum anteriorly, no bleeding. Bilateral inguinal lymph nodes were negative. Musculoskeletal:no cyanosis of digits and no clubbing  PSYCH: alert & oriented x 3 with fluent speech NEURO: no focal motor/sensory deficits  LABORATORY DATA:  I have reviewed the data as listed CBC Latest Ref Rng & Units 04/15/2016 02/09/2016 07/17/2015  WBC 4.0 - 10.3 10e3/uL 7.1 6.2 -  Hemoglobin 13.0 - 17.1 g/dL 11.0(L) 12.5(L) 10.9(A)  Hematocrit 38.4 - 49.9 % 33.0(L) 38.0(L) -  Platelets 140 - 400 10e3/uL 125(L) 109(L) -    CMP Latest Ref Rng & Units 04/28/2016 04/15/2016 02/09/2016  Glucose 65 - 99 mg/dL 93 130 104(H)  BUN 7 - 25 mg/dL 13 10.6 10  Creatinine 0.70 - 1.25 mg/dL 1.49(H) 1.2 1.23  Sodium 135 - 146 mmol/L 143 146(H) 145  Potassium 3.5 - 5.3 mmol/L 4.0 3.8 3.9  Chloride 98 - 110 mmol/L 105 - 106  CO2 20 - 31 mmol/L _0 Calcium 8.6 - 10.3 mg/dL 8.5(L) 9.2 8.9  Total  Protein 6.4 - 8.3 g/dL - 7.4 7.0  Total Bilirubin 0.20 - 1.20 mg/dL - 0.90 0.8  Alkaline Phos 40 - 150 U/L - 98 97  AST 5 - 34 U/L - 23 19  ALT 0 - 55 U/L - 21 12   PATHOLOGY REPORT  Diagnosis 04/01/2016 Rectum, biopsy, mass INVASIVE ADENOCARCINOMA, GRADE 2 MARGIN OF RESECTION IS POSITIVE FOR ADENOCARCINOMA  MMR-normal   RADIOGRAPHIC STUDIES: I have personally reviewed the radiological images as listed and agreed with the findings in the report. Mr Pelvis W Wo Contrast  Result Date: 05/04/2016 CLINICAL DATA:  Newly diagnosed rectal adenocarcinoma. EXAM: MRI PELVIS WITHOUT AND WITH CONTRAST TECHNIQUE: Multiplanar multisequence MR imaging of the pelvis was performed both before and after administration of intravenous contrast. CONTRAST:  71m MULTIHANCE GADOBENATE DIMEGLUMINE 529 MG/ML IV SOLN COMPARISON:  None. FINDINGS: Location of Tumor in Rectum:  Low Tumor Description: Nodular soft tissue mass is seen along the anterior and right lateral walls of the rectum Length of Tumor: 4.6 cm Distance from inferior tumor margin to internal sphincter/anorectal junction: <1 cm Tumor Extension through Muscularis Propria: No (T2) Adjacent Organ Involvement: No Levator Ani Involvement:  No Perirectal Lymph Nodes >=54m A left posterior perirectal lymph node is seen measuring 9 mm on image 9/3. A right posterior perirectal lymph node is seen measuring 7 mm on image 6/3. (N1) Other: Mild to moderately enlarged prostate gland, with numerous BPH nodules and mass effect on bladder base. IMPRESSION: Low rectal adenocarcinoma, T2 N1 MX by imaging.  Distance from inferior tumor margin to anorectal junction is <1 cm. Electronically Signed   By: JoEarle Gell.D.   On: 05/04/2016 19:40   Mr Abdomen Wwo Contrast  Result Date: 04/18/2016 CLINICAL DATA:  Evaluate indeterminate liver lesion identified on recent CT EXAM: MRI ABDOMEN WITHOUT AND WITH CONTRAST TECHNIQUE: Multiplanar multisequence MR imaging of the abdomen was performed both before and after the administration of intravenous contrast. CONTRAST:  10 cc Eovist COMPARISON:  04/04/2016 FINDINGS: Lower chest: No acute findings. Hepatobiliary: The liver is cirrhotic. There is hypertrophy of the lateral segment of left lobe and caudate lobe of liver. The ill defined lesion within the lateral segment of left lobe is again noted measuring 1.1 cm. This is T2 hyperintense and T1 hypo intense. This does not have the typical enhancement characteristics of a hepatoma. Unfortunately, the post contrast images are somewhat limited by respiratory motion artifact. There is peripheral enhancement along the margin of this lesion which is suggestive of a hemangioma. Multiple stones identified within the gallbladder which measure up to 2 cm. No biliary ductal dilatation. Pancreas: No mass, inflammatory changes, or other parenchymal abnormality identified. Spleen: The spleen measures 17.5 cm in length. No focal splenic abnormality identified. Adrenals/Urinary Tract: Left adrenal nodule is identified measuring 11 mm, image 49 of series 5 and is compatible with a benign adenoma. Small bilateral renal cysts are identified. Arising from the posterior cortex of the left kidney is an indeterminate, mildly T2 hyperintense which does not have the appearance of a simple cysts. This is incompletely characterized on the postcontrast images.On the study from 04/04/2016 this has the appearance of a solid enhancing lesion (image 674 of series 3 from the study dated 04/04/2016. Stomach/Bowel: The stomach and visualized upper abdominal  bowel loops are unremarkable. Vascular/Lymphatic: No pathologically enlarged lymph nodes identified. No abdominal aortic aneurysm demonstrated. Other:  None. Musculoskeletal: No suspicious bone lesions identified. IMPRESSION: 1. Morphologic features of liver compatible cirrhosis. 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 2. There is a complex lesion within the left kidney which is only partially visualized on this study which was tailored to assess the liver. When compared with study from 04/04/2016 this is favored to represent a small solid enhancing neoplasm, i.e. renal cell carcinoma. Urologic consultation is recommended. 3. The indeterminate lesion within the left lobe of liver described on recent CT remains indeterminate. This large reflects motion artifact which degrades the post-contrast sequences. There are no specific findings associated with this lesion to suggest [place] however correlation with AFP levels would be advised. Additionally, in light of the patient's increased risk for hepatoma and liver metastases, followup imaging with repeat contrast enhanced MRI abdomen in 3-6 months is advised to ensure stability of this lesion. Electronically Signed   By: Kerby Moors M.D.   On: 04/18/2016 08:47   COLONOSCOPY 04/01/2016 Dr. Marcello Moores  A fungating non-obstructing medium-sized mass was found in the distal rectum. The mass was noncircumferential. The mass measured three cm in length. No bleeding was present. Biopsies were taken with a cold forceps for histology. IMPRESSION  - Diverticulosis in the sigmoid colon. 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 Biopsied.  ASSESSMENT & PLAN: 65 year old Caucasian male, with past medical history of hypertension, chronic systolic heart failure with EF of 20-25%, iron deficient anemia, presented with a palpable rectal mass.  1. Adenoma of rectum, low anterior rectum, cT2N1M0, stage IIIA -I reviewed his CT scan, endoscopy, and biopsy  results in details with patient -His CT scan reviewed no definitive evidence of distant metastasis, however he a 1.4 cm indeterminate liver lesion, need further workup.  -His pelvic MRI showed T2N1 disease, I reviewed with patient -His abdominal MRI showed indeterminate liver lesion, needs close follow-up. He also has imaging evidence of liver cirrhosis, unclear etiology, I'll check his AFP to ruled out hepatocellular carcinoma. -We reviewed the natural history of rectal cancer and treatment options -Given his locally advanced stage IIIA disease, I recommended him to have neoadjuvant chemoradiation. Option of Xeloda and 5-fu infusion were discussed with patient in detail, he opted Xeloda  --Chemotherapy consent: Side effects including but does not not limited to, fatigue, nausea, vomiting, diarrhea, hair loss, neuropathy, fluid retention, renal and kidney dysfunction, neutropenic fever, needed for blood transfusion, bleeding, coronary artery spasm and heart attack, were discussed with patient in great detail. He agrees to proceed if needed. 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 HTN, chronic systolic heart failure with EF 20-25% -He will continue follow-up with his primary care physician and cardiologist -Continue medication  3. Iron deficient anemia -His on study in 2016 was consistent with iron deficiency -He is on oral iron, we'll continue -I'll repeat his CBC and iron studies today, to see if he needs IV iron.  4. Liver lesion -He has a 1.4 cm indeterminate liver lesion in the left lobe, found on his staging CT scan, and was evaluated by MRI, no typical MRI image characteristics of hepatocellular carcinoma, however image quality was limited. Giving the imaging evidence of liver cirrhosis, I'll check AFP. -will follow up closely with repeated CT or MRI in 2-3 months   Plan -he will start concurrent chemotherapy and  irradiation next week 12/14 -lab weekly when he is on treatment -I will see him back on 12/20   All questions were answered. The patient knows to call the clinic with any problems, questions or concerns. I spent 25  minutes counseling the patient face to face. The total time spent in the appointment was 30 minutes and more than 50% was on counseling.     Truitt Merle, MD 05/05/2016 10:51 PM

## 2016-05-06 NOTE — Progress Notes (Signed)
  Radiation Oncology         [phone] ________________________________  Name: Patrick [name] MRN: [id number]  Date: 05/05/2016  DOB: 22-May-1951   SIMULATION AND TREATMENT PLANNING NOTE  DIAGNOSIS:     ICD-9-CM ICD-10-CM   1. Adenocarcinoma of rectum (HCC) 154.1 C20      The patient presented for simulation for the patient's upcoming course of radiation for the diagnosis of rectal cancer. The patient was placed in a supine position. A customized vac-lock bag was constructed to aid in patient immobilization on. This complex treatment device will be used on a daily basis during the treatment. In this fashion a CT scan was obtained through the pelvic region and the isocenter was placed near midline within the pelvis. Surface markings were placed.  The patient's imaging was loaded into the radiation treatment planning system. The patient will initially be planned to receive a course of radiation to a dose of 45 Gy. This will be accomplished in 25 fractions at 1.8 gray per fraction. This initial treatment will correspond to a 3-D conformal technique. The target has been contoured in addition to the rectum, bladder and femoral heads. Dose volume histograms of each of these structures have been requested and these will be carefully reviewed as part of the 3-D conformal treatment planning process. To accomplish this initial treatment, 4 customized blocks have been designed for this purpose. Each of these 4 complex treatment devices will be used on a daily basis during the initial course of the treatment. It is anticipated that the patient will then receive a boost for an additional 5.4 Gy. The anticipated total dose therefore will be 50.4 Gy.    Special treatment procedure The patient will receive chemotherapy during the course of radiation treatment. The patient may experience increased or overlapping toxicity due to this combined-modality approach and the patient will be monitored for such problems.  This may include extra lab work as necessary. This therefore constitutes a special treatment procedure.    ________________________________  Jodelle Gross, MD, PhD

## 2016-05-06 NOTE — Progress Notes (Signed)
  Radiation Oncology         [phone] ________________________________  Name: Patrick [name] MRN: [id number]  Date: 05/05/2016  DOB: 07/10/1950  Optical Surface Tracking Plan:  Since intensity modulated radiotherapy (IMRT) and 3D conformal radiation treatment methods are predicated on accurate and precise positioning for treatment, intrafraction motion monitoring is medically necessary to ensure accurate and safe treatment delivery.  The ability to quantify intrafraction motion without excessive ionizing radiation dose can only be performed with optical surface tracking. Accordingly, surface imaging offers the opportunity to obtain 3D measurements of patient position throughout IMRT and 3D treatments without excessive radiation exposure.  I am ordering optical surface tracking for this patient's upcoming course of radiotherapy. ________________________________  Kyung Rudd, MD 05/06/2016 10:12 AM    Reference:   Particia Jasper, et al. Surface imaging-based analysis of intrafraction motion for breast radiotherapy patients.Journal of [place], n. 6, nov. 2014. ISSN [id number].   Available at: <http://www.jacmp.org/index.php/jacmp/article/view/4957>.

## 2016-05-09 ENCOUNTER — Telehealth: Payer: Self-pay | Admitting: Hematology

## 2016-05-09 NOTE — Telephone Encounter (Signed)
Lvm advising appt 12/14 @ 1.15pm and asked that he pick up a calendar then.

## 2016-05-10 DIAGNOSIS — I2510 Atherosclerotic heart disease of native coronary artery without angina pectoris: Secondary | ICD-10-CM | POA: Diagnosis not present

## 2016-05-10 DIAGNOSIS — Z825 Family history of asthma and other chronic lower respiratory diseases: Secondary | ICD-10-CM | POA: Diagnosis not present

## 2016-05-10 DIAGNOSIS — R7303 Prediabetes: Secondary | ICD-10-CM | POA: Diagnosis not present

## 2016-05-10 DIAGNOSIS — Z833 Family history of diabetes mellitus: Secondary | ICD-10-CM | POA: Diagnosis not present

## 2016-05-10 DIAGNOSIS — E785 Hyperlipidemia, unspecified: Secondary | ICD-10-CM | POA: Diagnosis not present

## 2016-05-10 DIAGNOSIS — Z87891 Personal history of nicotine dependence: Secondary | ICD-10-CM | POA: Diagnosis not present

## 2016-05-10 DIAGNOSIS — K5730 Diverticulosis of large intestine without perforation or abscess without bleeding: Secondary | ICD-10-CM | POA: Diagnosis not present

## 2016-05-10 DIAGNOSIS — Z7982 Long term (current) use of aspirin: Secondary | ICD-10-CM | POA: Diagnosis not present

## 2016-05-10 DIAGNOSIS — M1990 Unspecified osteoarthritis, unspecified site: Secondary | ICD-10-CM | POA: Diagnosis not present

## 2016-05-10 DIAGNOSIS — Z510 Encounter for antineoplastic radiation therapy: Secondary | ICD-10-CM | POA: Diagnosis not present

## 2016-05-10 DIAGNOSIS — C20 Malignant neoplasm of rectum: Secondary | ICD-10-CM | POA: Diagnosis not present

## 2016-05-10 DIAGNOSIS — I509 Heart failure, unspecified: Secondary | ICD-10-CM | POA: Diagnosis not present

## 2016-05-10 DIAGNOSIS — K769 Liver disease, unspecified: Secondary | ICD-10-CM | POA: Diagnosis not present

## 2016-05-10 DIAGNOSIS — Z79899 Other long term (current) drug therapy: Secondary | ICD-10-CM | POA: Diagnosis not present

## 2016-05-10 DIAGNOSIS — Z823 Family history of stroke: Secondary | ICD-10-CM | POA: Diagnosis not present

## 2016-05-10 DIAGNOSIS — Z8249 Family history of ischemic heart disease and other diseases of the circulatory system: Secondary | ICD-10-CM | POA: Diagnosis not present

## 2016-05-10 DIAGNOSIS — I110 Hypertensive heart disease with heart failure: Secondary | ICD-10-CM | POA: Diagnosis not present

## 2016-05-10 DIAGNOSIS — Z809 Family history of malignant neoplasm, unspecified: Secondary | ICD-10-CM | POA: Diagnosis not present

## 2016-05-10 DIAGNOSIS — N400 Enlarged prostate without lower urinary tract symptoms: Secondary | ICD-10-CM | POA: Diagnosis not present

## 2016-05-12 ENCOUNTER — Ambulatory Visit
Admission: RE | Admit: 2016-05-12 | Discharge: 2016-05-12 | Disposition: A | Discharge status: Home / Self Care | Payer: Medicare Other | Source: Ambulatory Visit | Attending: Radiation Oncology | Admitting: Radiation Oncology

## 2016-05-12 ENCOUNTER — Encounter: Payer: Self-pay | Admitting: Radiation Oncology

## 2016-05-12 ENCOUNTER — Other Ambulatory Visit: Payer: Medicare Other

## 2016-05-12 VITALS — BP 124/70 | HR 53 | Temp 98.0°F | Resp 18 | Ht 69.0 in | Wt 198.6 lb

## 2016-05-12 DIAGNOSIS — Z79899 Other long term (current) drug therapy: Secondary | ICD-10-CM | POA: Diagnosis not present

## 2016-05-12 DIAGNOSIS — N400 Enlarged prostate without lower urinary tract symptoms: Secondary | ICD-10-CM | POA: Diagnosis not present

## 2016-05-12 DIAGNOSIS — Z87891 Personal history of nicotine dependence: Secondary | ICD-10-CM | POA: Diagnosis not present

## 2016-05-12 DIAGNOSIS — Z825 Family history of asthma and other chronic lower respiratory diseases: Secondary | ICD-10-CM | POA: Diagnosis not present

## 2016-05-12 DIAGNOSIS — Z809 Family history of malignant neoplasm, unspecified: Secondary | ICD-10-CM | POA: Diagnosis not present

## 2016-05-12 DIAGNOSIS — Z833 Family history of diabetes mellitus: Secondary | ICD-10-CM | POA: Diagnosis not present

## 2016-05-12 DIAGNOSIS — R7303 Prediabetes: Secondary | ICD-10-CM | POA: Diagnosis not present

## 2016-05-12 DIAGNOSIS — M1990 Unspecified osteoarthritis, unspecified site: Secondary | ICD-10-CM | POA: Diagnosis not present

## 2016-05-12 DIAGNOSIS — I509 Heart failure, unspecified: Secondary | ICD-10-CM | POA: Diagnosis not present

## 2016-05-12 DIAGNOSIS — Z8249 Family history of ischemic heart disease and other diseases of the circulatory system: Secondary | ICD-10-CM | POA: Diagnosis not present

## 2016-05-12 DIAGNOSIS — K769 Liver disease, unspecified: Secondary | ICD-10-CM | POA: Diagnosis not present

## 2016-05-12 DIAGNOSIS — I2510 Atherosclerotic heart disease of native coronary artery without angina pectoris: Secondary | ICD-10-CM | POA: Diagnosis not present

## 2016-05-12 DIAGNOSIS — I110 Hypertensive heart disease with heart failure: Secondary | ICD-10-CM | POA: Diagnosis not present

## 2016-05-12 DIAGNOSIS — E785 Hyperlipidemia, unspecified: Secondary | ICD-10-CM | POA: Diagnosis not present

## 2016-05-12 DIAGNOSIS — Z510 Encounter for antineoplastic radiation therapy: Secondary | ICD-10-CM | POA: Diagnosis not present

## 2016-05-12 DIAGNOSIS — C20 Malignant neoplasm of rectum: Secondary | ICD-10-CM | POA: Diagnosis not present

## 2016-05-12 DIAGNOSIS — Z823 Family history of stroke: Secondary | ICD-10-CM | POA: Diagnosis not present

## 2016-05-12 DIAGNOSIS — Z7982 Long term (current) use of aspirin: Secondary | ICD-10-CM | POA: Diagnosis not present

## 2016-05-12 DIAGNOSIS — K5730 Diverticulosis of large intestine without perforation or abscess without bleeding: Secondary | ICD-10-CM | POA: Diagnosis not present

## 2016-05-12 NOTE — Progress Notes (Signed)
Department of Radiation Oncology  Phone:  [phone] Fax:        [phone]  Weekly Treatment Note    Name: Patrick [name] Date: 05/12/2016 MRN: [id number] DOB: 1950/12/06   Diagnosis:     ICD-9-CM ICD-10-CM   1. 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 acetaminophen (TYLENOL) 500 MG tablet Take 1,000 mg by mouth every 6 (six) hours as needed for mild pain.    . Ascorbic Acid (VITAMIN C) 1000 MG tablet Take 2,000 mg by mouth 2 (two) times daily.    Marland Kitchen aspirin EC 81 MG EC tablet Take 1 tablet (81 mg total) by mouth daily. 30 tablet 0  . capecitabine (XELODA) 500 MG tablet Take #4 tab in morning and #3 tab in evening on days of radiation-Monday-Friday 210 tablet 0  . carvedilol (COREG) 25 MG tablet Take 1 tablet (25 mg total) by mouth 2 (two) times daily. 180 tablet 3  . ferrous sulfate 325 (65 FE) MG tablet take 1 tablet by mouth once daily WITH BREAKFAST 30 tablet 3  . finasteride (PROSCAR) 5 MG tablet Take 1 tablet (5 mg total) by mouth daily. 90 tablet 1  . losartan (COZAAR) 100 MG tablet Take 1 tablet (100 mg total) by mouth daily. 30 tablet 11  . lovastatin (MEVACOR) 20 MG tablet Take 1 tablet (20 mg total) by mouth at bedtime. 90 tablet 1  . metolazone (ZAROXOLYN) 2.5 MG tablet Take 1 tablet (2.5 mg total) by mouth once a week as needed for extra edema/weight gain. 30 tablet 3  . mirtazapine (REMERON SOL-TAB) 15 MG disintegrating tablet place 1 tablet ON TONGUE at bedtime (Patient taking differently: Take 15 mg by mouth at bedtime. place 1 tablet ON TONGUE at bedtime) 90 tablet 0  . Multiple Vitamins-Minerals (MULTIVITAMIN WITH MINERALS) tablet Take 1 tablet by mouth daily.    Marland Kitchen omega-3 acid ethyl esters (LOVAZA) 1 g capsule take 2 capsules by mouth twice a day 360 capsule 1  . oxyCODONE-acetaminophen (PERCOCET/ROXICET) 5-325 MG tablet Take 1  tablet by mouth every 8 (eight) hours as needed for severe pain. 90 tablet 0  . potassium chloride SA (K-DUR,KLOR-CON) 20 MEQ tablet TAKE 3 TABLET BY MOUTH EVERY DAY (Patient taking differently: Take 20 mEq by mouth 3 (three) times daily. ) 270 tablet 3  . tamsulosin (FLOMAX) 0.4 MG CAPS capsule Take 0.4 mg by mouth daily.     Marland Kitchen torsemide (DEMADEX) 20 MG tablet take 4 tablets by mouth twice a day 720 tablet 0  . traZODone (DESYREL) 100 MG tablet Take 1 tablet (100 mg total) by mouth 2 (two) times daily. 180 tablet 1  . nitroGLYCERIN (NITROSTAT) 0.4 MG SL tablet Place 1 tablet (0.4 mg total) under the tongue every 5 (five) minutes as needed for chest pain. (Patient not taking: Reported on 05/12/2016) 25 tablet 3   No current facility-administered medications for this encounter.      ALLERGIES: Patient has no known allergies.   LABORATORY DATA:  Lab Results  Component Value Date   WBC 7.1 04/15/2016   HGB 11.0 (L) 04/15/2016   HCT 33.0 (L) 04/15/2016   MCV 84.8 04/15/2016   PLT 125 (L) 04/15/2016   Lab Results  Component Value Date   NA 143 04/28/2016   K 4.0 04/28/2016   CL 105 04/28/2016   CO2 29  04/28/2016   Lab Results  Component Value Date   ALT 21 04/15/2016   AST 23 04/15/2016   ALKPHOS 98 04/15/2016   BILITOT 0.90 04/15/2016     NARRATIVE: Patrick [name] was seen today for weekly treatment management. The chart was checked and the patient's films were reviewed.  Patrick [name] has received one radiation treatment to rectum. He is having regular bowel movements every other day. Denies nausea, vomiting , diarrhea, or skin irritation. Reports feeling some fatigue, feels it is from his cardiac issues. Appetite is good.   PHYSICAL EXAMINATION: height is 5\' 9"  (1.753 m) and weight is 198 lb 9.6 oz (90.1 kg). His oral temperature is 98 F (36.7 C). His blood pressure is 124/70 and his pulse is 53 (abnormal). His respiration is 18 and oxygen saturation is 98%.      Alert and in no  acute distress.   ASSESSMENT: The patient is doing satisfactorily with treatment.  PLAN: We will continue with the patient's radiation treatment as planned.    ------------------------------------------------  Jodelle Gross, MD, PhD  This document serves as a record of services personally performed by Kyung Rudd, MD. It was created on his behalf by Arlyce Harman, a trained medical scribe. The creation of this record is based on the scribe's personal observations and the provider's statements to them. This document has been checked and approved by the attending provider.

## 2016-05-12 NOTE — Progress Notes (Signed)
Patrick [name] has received one radiation treatment to rectum.  Patient teaching patient edication done, radiation therapy and you book, my business card,discussed ways to manage side effects, nausea,vomiting,diarrhea, fatige,skin irritation, get imodium for diarrhea prn,low fiber diet, 5-6 smaller meals if having diarrhea, baby wipes no alcohol, let staff rn know if needs sitz bath,, urinary changes, eat protin diet,drink more wtare, , verbal understanding, teach back Having regular bowel movements every other day.  Denies nausea,vomiting,diarrhea or skin irritation.  Reports feeling some fatigue feels it is from his cardiac issues.  Appetite is good. Wt Readings from Last 3 Encounters:  05/12/16 198 lb 9.6 oz (90.1 kg)  05/05/16 197 lb 12.8 oz (89.7 kg)  04/28/16 209 lb 4 oz (94.9 kg)  BP 124/70   Pulse (!) 53   Temp 98 F (36.7 C) (Oral)   Resp 18   Ht 5\' 9"  (1.753 m)   Wt 198 lb 9.6 oz (90.1 kg)   SpO2 98%   BMI 29.33 kg/m

## 2016-05-13 ENCOUNTER — Ambulatory Visit
Admission: RE | Admit: 2016-05-13 | Discharge: 2016-05-13 | Disposition: A | Discharge status: Home / Self Care | Payer: Medicare Other | Source: Ambulatory Visit | Attending: Radiation Oncology | Admitting: Radiation Oncology

## 2016-05-13 DIAGNOSIS — K769 Liver disease, unspecified: Secondary | ICD-10-CM | POA: Diagnosis not present

## 2016-05-13 DIAGNOSIS — N400 Enlarged prostate without lower urinary tract symptoms: Secondary | ICD-10-CM | POA: Diagnosis not present

## 2016-05-13 DIAGNOSIS — R7303 Prediabetes: Secondary | ICD-10-CM | POA: Diagnosis not present

## 2016-05-13 DIAGNOSIS — Z823 Family history of stroke: Secondary | ICD-10-CM | POA: Diagnosis not present

## 2016-05-13 DIAGNOSIS — Z87891 Personal history of nicotine dependence: Secondary | ICD-10-CM | POA: Diagnosis not present

## 2016-05-13 DIAGNOSIS — M1990 Unspecified osteoarthritis, unspecified site: Secondary | ICD-10-CM | POA: Diagnosis not present

## 2016-05-13 DIAGNOSIS — K5730 Diverticulosis of large intestine without perforation or abscess without bleeding: Secondary | ICD-10-CM | POA: Diagnosis not present

## 2016-05-13 DIAGNOSIS — Z8249 Family history of ischemic heart disease and other diseases of the circulatory system: Secondary | ICD-10-CM | POA: Diagnosis not present

## 2016-05-13 DIAGNOSIS — E785 Hyperlipidemia, unspecified: Secondary | ICD-10-CM | POA: Diagnosis not present

## 2016-05-13 DIAGNOSIS — Z809 Family history of malignant neoplasm, unspecified: Secondary | ICD-10-CM | POA: Diagnosis not present

## 2016-05-13 DIAGNOSIS — Z7982 Long term (current) use of aspirin: Secondary | ICD-10-CM | POA: Diagnosis not present

## 2016-05-13 DIAGNOSIS — C20 Malignant neoplasm of rectum: Secondary | ICD-10-CM | POA: Diagnosis not present

## 2016-05-13 DIAGNOSIS — I509 Heart failure, unspecified: Secondary | ICD-10-CM | POA: Diagnosis not present

## 2016-05-13 DIAGNOSIS — I2510 Atherosclerotic heart disease of native coronary artery without angina pectoris: Secondary | ICD-10-CM | POA: Diagnosis not present

## 2016-05-13 DIAGNOSIS — Z510 Encounter for antineoplastic radiation therapy: Secondary | ICD-10-CM | POA: Diagnosis not present

## 2016-05-13 DIAGNOSIS — Z833 Family history of diabetes mellitus: Secondary | ICD-10-CM | POA: Diagnosis not present

## 2016-05-13 DIAGNOSIS — I110 Hypertensive heart disease with heart failure: Secondary | ICD-10-CM | POA: Diagnosis not present

## 2016-05-13 DIAGNOSIS — Z825 Family history of asthma and other chronic lower respiratory diseases: Secondary | ICD-10-CM | POA: Diagnosis not present

## 2016-05-13 DIAGNOSIS — Z79899 Other long term (current) drug therapy: Secondary | ICD-10-CM | POA: Diagnosis not present

## 2016-05-16 ENCOUNTER — Other Ambulatory Visit: Payer: Self-pay | Admitting: Family Medicine

## 2016-05-16 ENCOUNTER — Ambulatory Visit
Admission: RE | Admit: 2016-05-16 | Discharge: 2016-05-16 | Disposition: A | Discharge status: Home / Self Care | Payer: Medicare Other | Source: Ambulatory Visit | Attending: Radiation Oncology | Admitting: Radiation Oncology

## 2016-05-16 DIAGNOSIS — M1990 Unspecified osteoarthritis, unspecified site: Secondary | ICD-10-CM | POA: Diagnosis not present

## 2016-05-16 DIAGNOSIS — N400 Enlarged prostate without lower urinary tract symptoms: Secondary | ICD-10-CM | POA: Diagnosis not present

## 2016-05-16 DIAGNOSIS — R7303 Prediabetes: Secondary | ICD-10-CM | POA: Diagnosis not present

## 2016-05-16 DIAGNOSIS — Z809 Family history of malignant neoplasm, unspecified: Secondary | ICD-10-CM | POA: Diagnosis not present

## 2016-05-16 DIAGNOSIS — E785 Hyperlipidemia, unspecified: Secondary | ICD-10-CM | POA: Diagnosis not present

## 2016-05-16 DIAGNOSIS — K769 Liver disease, unspecified: Secondary | ICD-10-CM | POA: Diagnosis not present

## 2016-05-16 DIAGNOSIS — Z79899 Other long term (current) drug therapy: Secondary | ICD-10-CM | POA: Diagnosis not present

## 2016-05-16 DIAGNOSIS — Z8249 Family history of ischemic heart disease and other diseases of the circulatory system: Secondary | ICD-10-CM | POA: Diagnosis not present

## 2016-05-16 DIAGNOSIS — Z7982 Long term (current) use of aspirin: Secondary | ICD-10-CM | POA: Diagnosis not present

## 2016-05-16 DIAGNOSIS — Z823 Family history of stroke: Secondary | ICD-10-CM | POA: Diagnosis not present

## 2016-05-16 DIAGNOSIS — Z87891 Personal history of nicotine dependence: Secondary | ICD-10-CM | POA: Diagnosis not present

## 2016-05-16 DIAGNOSIS — Z833 Family history of diabetes mellitus: Secondary | ICD-10-CM | POA: Diagnosis not present

## 2016-05-16 DIAGNOSIS — I509 Heart failure, unspecified: Secondary | ICD-10-CM | POA: Diagnosis not present

## 2016-05-16 DIAGNOSIS — C20 Malignant neoplasm of rectum: Secondary | ICD-10-CM | POA: Diagnosis not present

## 2016-05-16 DIAGNOSIS — Z510 Encounter for antineoplastic radiation therapy: Secondary | ICD-10-CM | POA: Diagnosis not present

## 2016-05-16 DIAGNOSIS — K5730 Diverticulosis of large intestine without perforation or abscess without bleeding: Secondary | ICD-10-CM | POA: Diagnosis not present

## 2016-05-16 DIAGNOSIS — I110 Hypertensive heart disease with heart failure: Secondary | ICD-10-CM | POA: Diagnosis not present

## 2016-05-16 DIAGNOSIS — Z825 Family history of asthma and other chronic lower respiratory diseases: Secondary | ICD-10-CM | POA: Diagnosis not present

## 2016-05-16 DIAGNOSIS — I2510 Atherosclerotic heart disease of native coronary artery without angina pectoris: Secondary | ICD-10-CM | POA: Diagnosis not present

## 2016-05-17 ENCOUNTER — Ambulatory Visit
Admission: RE | Admit: 2016-05-17 | Discharge: 2016-05-17 | Disposition: A | Discharge status: Home / Self Care | Payer: Medicare Other | Source: Ambulatory Visit | Attending: Radiation Oncology | Admitting: Radiation Oncology

## 2016-05-17 DIAGNOSIS — M1990 Unspecified osteoarthritis, unspecified site: Secondary | ICD-10-CM | POA: Diagnosis not present

## 2016-05-17 DIAGNOSIS — C20 Malignant neoplasm of rectum: Secondary | ICD-10-CM | POA: Diagnosis not present

## 2016-05-17 DIAGNOSIS — Z809 Family history of malignant neoplasm, unspecified: Secondary | ICD-10-CM | POA: Diagnosis not present

## 2016-05-17 DIAGNOSIS — R7303 Prediabetes: Secondary | ICD-10-CM | POA: Diagnosis not present

## 2016-05-17 DIAGNOSIS — Z823 Family history of stroke: Secondary | ICD-10-CM | POA: Diagnosis not present

## 2016-05-17 DIAGNOSIS — I2510 Atherosclerotic heart disease of native coronary artery without angina pectoris: Secondary | ICD-10-CM | POA: Diagnosis not present

## 2016-05-17 DIAGNOSIS — N400 Enlarged prostate without lower urinary tract symptoms: Secondary | ICD-10-CM | POA: Diagnosis not present

## 2016-05-17 DIAGNOSIS — Z79899 Other long term (current) drug therapy: Secondary | ICD-10-CM | POA: Diagnosis not present

## 2016-05-17 DIAGNOSIS — I509 Heart failure, unspecified: Secondary | ICD-10-CM | POA: Diagnosis not present

## 2016-05-17 DIAGNOSIS — I110 Hypertensive heart disease with heart failure: Secondary | ICD-10-CM | POA: Diagnosis not present

## 2016-05-17 DIAGNOSIS — Z7982 Long term (current) use of aspirin: Secondary | ICD-10-CM | POA: Diagnosis not present

## 2016-05-17 DIAGNOSIS — Z510 Encounter for antineoplastic radiation therapy: Secondary | ICD-10-CM | POA: Diagnosis not present

## 2016-05-17 DIAGNOSIS — K5730 Diverticulosis of large intestine without perforation or abscess without bleeding: Secondary | ICD-10-CM | POA: Diagnosis not present

## 2016-05-17 DIAGNOSIS — Z8249 Family history of ischemic heart disease and other diseases of the circulatory system: Secondary | ICD-10-CM | POA: Diagnosis not present

## 2016-05-17 DIAGNOSIS — Z825 Family history of asthma and other chronic lower respiratory diseases: Secondary | ICD-10-CM | POA: Diagnosis not present

## 2016-05-17 DIAGNOSIS — Z833 Family history of diabetes mellitus: Secondary | ICD-10-CM | POA: Diagnosis not present

## 2016-05-17 DIAGNOSIS — K769 Liver disease, unspecified: Secondary | ICD-10-CM | POA: Diagnosis not present

## 2016-05-17 DIAGNOSIS — E785 Hyperlipidemia, unspecified: Secondary | ICD-10-CM | POA: Diagnosis not present

## 2016-05-17 DIAGNOSIS — Z87891 Personal history of nicotine dependence: Secondary | ICD-10-CM | POA: Diagnosis not present

## 2016-05-18 ENCOUNTER — Ambulatory Visit: Payer: Medicare Other | Admitting: Hematology

## 2016-05-18 ENCOUNTER — Other Ambulatory Visit: Payer: Medicare Other

## 2016-05-18 ENCOUNTER — Ambulatory Visit
Admission: RE | Admit: 2016-05-18 | Discharge: 2016-05-18 | Disposition: A | Discharge status: Home / Self Care | Payer: Medicare Other | Source: Ambulatory Visit | Attending: Radiation Oncology | Admitting: Radiation Oncology

## 2016-05-18 DIAGNOSIS — E785 Hyperlipidemia, unspecified: Secondary | ICD-10-CM | POA: Diagnosis not present

## 2016-05-18 DIAGNOSIS — Z510 Encounter for antineoplastic radiation therapy: Secondary | ICD-10-CM | POA: Diagnosis not present

## 2016-05-18 DIAGNOSIS — K769 Liver disease, unspecified: Secondary | ICD-10-CM | POA: Diagnosis not present

## 2016-05-18 DIAGNOSIS — K5730 Diverticulosis of large intestine without perforation or abscess without bleeding: Secondary | ICD-10-CM | POA: Diagnosis not present

## 2016-05-18 DIAGNOSIS — Z7982 Long term (current) use of aspirin: Secondary | ICD-10-CM | POA: Diagnosis not present

## 2016-05-18 DIAGNOSIS — N400 Enlarged prostate without lower urinary tract symptoms: Secondary | ICD-10-CM | POA: Diagnosis not present

## 2016-05-18 DIAGNOSIS — Z825 Family history of asthma and other chronic lower respiratory diseases: Secondary | ICD-10-CM | POA: Diagnosis not present

## 2016-05-18 DIAGNOSIS — I2510 Atherosclerotic heart disease of native coronary artery without angina pectoris: Secondary | ICD-10-CM | POA: Diagnosis not present

## 2016-05-18 DIAGNOSIS — R7303 Prediabetes: Secondary | ICD-10-CM | POA: Diagnosis not present

## 2016-05-18 DIAGNOSIS — M1990 Unspecified osteoarthritis, unspecified site: Secondary | ICD-10-CM | POA: Diagnosis not present

## 2016-05-18 DIAGNOSIS — C20 Malignant neoplasm of rectum: Secondary | ICD-10-CM | POA: Diagnosis not present

## 2016-05-18 DIAGNOSIS — Z823 Family history of stroke: Secondary | ICD-10-CM | POA: Diagnosis not present

## 2016-05-18 DIAGNOSIS — Z809 Family history of malignant neoplasm, unspecified: Secondary | ICD-10-CM | POA: Diagnosis not present

## 2016-05-18 DIAGNOSIS — Z833 Family history of diabetes mellitus: Secondary | ICD-10-CM | POA: Diagnosis not present

## 2016-05-18 DIAGNOSIS — Z8249 Family history of ischemic heart disease and other diseases of the circulatory system: Secondary | ICD-10-CM | POA: Diagnosis not present

## 2016-05-18 DIAGNOSIS — I110 Hypertensive heart disease with heart failure: Secondary | ICD-10-CM | POA: Diagnosis not present

## 2016-05-18 DIAGNOSIS — Z79899 Other long term (current) drug therapy: Secondary | ICD-10-CM | POA: Diagnosis not present

## 2016-05-18 DIAGNOSIS — I509 Heart failure, unspecified: Secondary | ICD-10-CM | POA: Diagnosis not present

## 2016-05-18 DIAGNOSIS — Z87891 Personal history of nicotine dependence: Secondary | ICD-10-CM | POA: Diagnosis not present

## 2016-05-18 NOTE — Progress Notes (Deleted)
[place]  [phone] [phone]  Clinic Follow up Note   Patient Care Team: Sela Hua, MD as PCP - General (Family Medicine) Leighton Ruff, MD as Consulting Physician (General Surgery) Kyung Rudd, MD as Consulting Physician (Radiation Oncology) Truitt Merle, MD as Consulting Physician (Hematology) Tania Ade, RN as Registered Nurse 05/18/2016  CHIEF COMPLAINTS:  Follow up rectal cancer  Oncology History   Adenocarcinoma of rectum [facility])   Staging form: Colon and Rectum, AJCC 7th Edition   - Clinical stage from 04/01/2016: T2, N1 - Signed by Truitt Merle, MD on 05/05/2016      Adenocarcinoma of rectum ([place])   04/01/2016 Initial Diagnosis    Adenocarcinoma of rectum ([place])      04/01/2016 Procedure    Colonoscopy showed a fungating nonobstructing medium-sized mass in the distal rectum, measured 3 cm in lengths. Diverticulosis in the sigmoid colon.      04/01/2016 Initial Biopsy    Rectal mass biopsy showed adenocarcinoma, grade 2      04/05/2016 Imaging    CT chest, abdomen and pelvis with contrast showed no primary rectal carcinoma is not well visualized, 7 mm left posterior perirectal and presacral lymph nodes, indeterminate 1.4 cm low to attenuation lesion in the left hepatic lobe, question hepatic cirrhosis, recommend abdominal MRI for further evaluation. No other evidence of distant metastasis.       HISTORY OF PRESENTING ILLNESS (04/15/2016):  Patrick [name] 65 y.o. male is here because of his recent diagnosed rectal cancer. He presents to our multidisciplinary GI clinic by himself today.  He has had intermittent rectal bleeding for a few years, which she contributes to hemorrhoids, slightly worse lately. He used to have regular bowel movement twice a day, which has been more irregular lately, and occasional loose or watery bowel movement. No significant rectal pain. He was seen by his urologist for routine follow-up of his BPH, and on  the rectal exam rectal mass was palpated, and he was referred to colorectal surgeon Dr. Marcello Moores. Colonoscopy on 04/01/2016 showed a fungating nonobstructing medium-sized mass in the distal rectum, 3 cm in length. Biopsy showed adenocarcinoma grade 2. CT CAP was negative distant metastasis, except a 1.4cm indeterminate lesion in the liver.  Review of systems also reviewed slightly decreased appetite, no signal weight loss. No other pain, nausea, or other symptoms. He has significant cardiomyopathy, nonischemic, has orthopnea, mild, able to function and tolerating routine activity at home. He is the primary caregiver of his sister, who is continuous nursing home. He is single, no children. Retired. No family history of colon cancer.  CURRENT THERAPY: Concurrent chemotherapy and irradiation with Xeloda 2071m in am and 15061mpm, starting next week  INTERIM HISTORY: [name] for follow-up and CT simulation. He is scheduled to start concurrent chemoradiation next week. His abdominal and pelvis MRI was rescheduled a few times due to his limited availability, he is his sisters caregiver. He is doing well overall, occasionally has rectal bleeding, no significant pain or other complains.    MEDICAL HISTORY:  Past Medical History:  Diagnosis Date  . Arthritis    "right hand; right knee" (06/19/2014)  . CAD (coronary artery disease)    2v CAD with subtotal occlusion of small co-dominant RCA and borderline lesion in moderate-sized OM-1  . CHF (congestive heart failure) (HCC)    Preserved EF  . Degenerative joint disease of knee, right   . Enlarged prostate   . Hyperlipidemia   .  Hypertension   . Pneumonia 05/2014  . Prediabetes 10/03/2014  . Scrotal edema 04/03/2015  . SMALL BOWEL OBSTRUCTION, HX OF 08/21/2007   Annotation: with narrowing in the ileocecal region Qualifier: Diagnosis of  By: Hassell Done FNP, Tori Milks      SURGICAL HISTORY: Past Surgical History:  Procedure Laterality Date  . CARDIAC  CATHETERIZATION N/A 02/16/2016   Procedure: Right/Left Heart Cath and Coronary Angiography;  Surgeon: Jolaine Artist, MD;  Location: [facility];  Service: Cardiovascular;  Laterality: N/A;  . COLONOSCOPY N/A 04/01/2016   Procedure: COLONOSCOPY;  Surgeon: Leighton Ruff, MD;  Location: [facility];  Service: Endoscopy;  Laterality: N/A;  . INGUINAL HERNIA REPAIR Left 1990's    SOCIAL HISTORY: Social History   Social History  . Marital status: Single    Spouse name: N/A  . Number of children: N/A  . Years of education: N/A   Occupational History  . Not on file.   Social History Main Topics  . Smoking status: Former Smoker    Years: 5.00    Types: Pipe, Landscape architect  . Smokeless tobacco: Never Used     Comment: 06/19/2014 "stopped smoking in ~ 2014; used to smoke a pipe or cigar a couple times/month"  . Alcohol use No  . Drug use: No  . Sexual activity: No   Other Topics Concern  . Not on file   Social History Narrative   Single, lives w/his sister of whom he is caregiver       FAMILY HISTORY: Family History  Problem Relation Age of Onset  . Hypertension Mother   . Diabetes Mother   . Stroke Mother   . Cancer Father 64    Prostate  . Dementia Father   . COPD Sister   . Arthritis Sister   . Edema Sister     ALLERGIES:  has No Known Allergies.  MEDICATIONS:  Current Outpatient Prescriptions  Medication Sig Dispense Refill  . acetaminophen (TYLENOL) 500 MG tablet Take 1,000 mg by mouth every 6 (six) hours as needed for mild pain.    . Ascorbic Acid (VITAMIN C) 1000 MG tablet Take 2,000 mg by mouth 2 (two) times daily.    Marland Kitchen aspirin EC 81 MG EC tablet Take 1 tablet (81 mg total) by mouth daily. 30 tablet 0  . capecitabine (XELODA) 500 MG tablet Take #4 tab in morning and #3 tab in evening on days of radiation-Monday-Friday 210 tablet 0  . carvedilol (COREG) 25 MG tablet Take 1 tablet (25 mg total) by mouth 2 (two) times daily. 180 tablet 3  . ferrous sulfate  325 (65 FE) MG tablet take 1 tablet by mouth once daily WITH BREAKFAST 30 tablet 3  . finasteride (PROSCAR) 5 MG tablet Take 1 tablet (5 mg total) by mouth daily. 90 tablet 1  . losartan (COZAAR) 100 MG tablet Take 1 tablet (100 mg total) by mouth daily. 30 tablet 11  . lovastatin (MEVACOR) 20 MG tablet Take 1 tablet (20 mg total) by mouth at bedtime. 90 tablet 1  . metolazone (ZAROXOLYN) 2.5 MG tablet Take 1 tablet (2.5 mg total) by mouth once a week as needed for extra edema/weight gain. 30 tablet 3  . mirtazapine (REMERON SOL-TAB) 15 MG disintegrating tablet place 1 tablet ON TONGUE at bedtime (Patient taking differently: Take 15 mg by mouth at bedtime. place 1 tablet ON TONGUE at bedtime) 90 tablet 0  . Multiple Vitamins-Minerals (MULTIVITAMIN WITH MINERALS) tablet Take 1 tablet by mouth daily.    Marland Kitchen  nitroGLYCERIN (NITROSTAT) 0.4 MG SL tablet Place 1 tablet (0.4 mg total) under the tongue every 5 (five) minutes as needed for chest pain. (Patient not taking: Reported on 05/12/2016) 25 tablet 3  . omega-3 acid ethyl esters (LOVAZA) 1 g capsule take 2 capsules by mouth twice a day 360 capsule 1  . oxyCODONE-acetaminophen (PERCOCET/ROXICET) 5-325 MG tablet Take 1 tablet by mouth every 8 (eight) hours as needed for severe pain. 90 tablet 0  . potassium chloride SA (K-DUR,KLOR-CON) 20 MEQ tablet TAKE 3 TABLET BY MOUTH EVERY DAY (Patient taking differently: Take 20 mEq by mouth 3 (three) times daily. ) 270 tablet 3  . tamsulosin (FLOMAX) 0.4 MG CAPS capsule Take 0.4 mg by mouth daily.     Marland Kitchen torsemide (DEMADEX) 20 MG tablet take 4 tablets by mouth twice a day 720 tablet 0  . traZODone (DESYREL) 100 MG tablet Take 1 tablet (100 mg total) by mouth 2 (two) times daily. 180 tablet 1   No current facility-administered medications for this visit.     REVIEW OF SYSTEMS:   Constitutional: Denies fevers, chills or abnormal night sweats Eyes: Denies blurriness of vision, double vision or watery eyes Ears,  nose, mouth, throat, and face: Denies mucositis or sore throat Respiratory: Denies cough, dyspnea or wheezes Cardiovascular: Denies palpitation, chest discomfort or lower extremity swelling Gastrointestinal:  Denies nausea, heartburn or change in bowel habits Skin: Denies abnormal skin rashes Lymphatics: Denies new lymphadenopathy or easy bruising Neurological:Denies numbness, tingling or new weaknesses Behavioral/Psych: Mood is stable, no new changes  All other systems were reviewed with the patient and are negative.  PHYSICAL EXAMINATION: ECOG PERFORMANCE STATUS: 1 - Symptomatic but completely ambulatory  There were no vitals filed for this visit. There were no vitals filed for this visit.  GENERAL:alert, no distress and comfortable SKIN: skin color, texture, turgor are normal, no rashes or significant lesions EYES: normal, conjunctiva are pink and non-injected, sclera clear OROPHARYNX:no exudate, no erythema and lips, buccal mucosa, and tongue normal  NECK: supple, thyroid normal size, non-tender, without nodularity LYMPH:  no palpable lymphadenopathy in the cervical, axillary or inguinal LUNGS: clear to auscultation and percussion with normal breathing effort HEART: regular rate & rhythm and no murmurs and no lower extremity edema ABDOMEN:abdomen soft, non-tender and normal bowel sounds, rectal exam showed a firm mass in the low rectum anteriorly, no bleeding. Bilateral inguinal lymph nodes were negative. Musculoskeletal:no cyanosis of digits and no clubbing  PSYCH: alert & oriented x 3 with fluent speech NEURO: no focal motor/sensory deficits  LABORATORY DATA:  I have reviewed the data as listed CBC Latest Ref Rng & Units 04/15/2016 02/09/2016 07/17/2015  WBC 4.0 - 10.3 10e3/uL 7.1 6.2 -  Hemoglobin 13.0 - 17.1 g/dL 11.0(L) 12.5(L) 10.9(A)  Hematocrit 38.4 - 49.9 % 33.0(L) 38.0(L) -  Platelets 140 - 400 10e3/uL 125(L) 109(L) -   CMP Latest Ref Rng & Units 04/28/2016 04/15/2016  02/09/2016  Glucose 65 - 99 mg/dL 93 130 104(H)  BUN 7 - 25 mg/dL 13 10.6 10  Creatinine 0.70 - 1.25 mg/dL 1.49(H) 1.2 1.23  Sodium 135 - 146 mmol/L 143 146(H) 145  Potassium 3.5 - 5.3 mmol/L 4.0 3.8 3.9  Chloride 98 - 110 mmol/L 105 - 106  CO2 20 - 31 mmol/L _0 Calcium 8.6 - 10.3 mg/dL 8.5(L) 9.2 8.9  Total Protein 6.4 - 8.3 g/dL - 7.4 7.0  Total Bilirubin 0.20 - 1.20 mg/dL - 0.90 0.8  Alkaline Phos 40 -  150 U/L - 98 97  AST 5 - 34 U/L - 23 19  ALT 0 - 55 U/L - 21 12   PATHOLOGY REPORT  Diagnosis 04/01/2016 Rectum, biopsy, mass INVASIVE ADENOCARCINOMA, GRADE 2 MARGIN OF RESECTION IS POSITIVE FOR ADENOCARCINOMA  MMR-normal   RADIOGRAPHIC STUDIES: I have personally reviewed the radiological images as listed and agreed with the findings in the report. Mr Pelvis W Wo Contrast  Result Date: 05/04/2016 CLINICAL DATA:  Newly diagnosed rectal adenocarcinoma. EXAM: MRI PELVIS WITHOUT AND WITH CONTRAST TECHNIQUE: Multiplanar multisequence MR imaging of the pelvis was performed both before and after administration of intravenous contrast. CONTRAST:  23m MULTIHANCE GADOBENATE DIMEGLUMINE 529 MG/ML IV SOLN COMPARISON:  None. FINDINGS: Location of Tumor in Rectum:  Low Tumor Description: Nodular soft tissue mass is seen along the anterior and right lateral walls of the rectum Length of Tumor: 4.6 cm Distance from inferior tumor margin to internal sphincter/anorectal junction: <1 cm Tumor Extension through Muscularis Propria: No (T2) Adjacent Organ Involvement: No Levator Ani Involvement:  No Perirectal Lymph Nodes >=512m A left posterior perirectal lymph node is seen measuring 9 mm on image 9/3. A right posterior perirectal lymph node is seen measuring 7 mm on image 6/3. (N1) Other: Mild to moderately enlarged prostate gland, with numerous BPH nodules and mass effect on bladder base. IMPRESSION: Low rectal adenocarcinoma, T2 N1 MX by imaging. Distance from inferior tumor margin to anorectal  junction is <1 cm. Electronically Signed   By: JoEarle Gell.D.   On: 05/04/2016 19:40   COLONOSCOPY 04/01/2016 Dr. ThMarcello MooresA fungating non-obstructing medium-sized mass was found in the distal rectum. The mass was noncircumferential. The mass measured three cm in length. No bleeding was present. Biopsies were taken with a cold forceps for histology. IMPRESSION  - Diverticulosis in the sigmoid colon. 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 Biopsied.  ASSESSMENT & PLAN: [age]ear old Caucasian male, with past medical history of hypertension, chronic systolic heart failure with EF of 20-25%, iron deficient anemia, presented with a palpable rectal mass.  1. Adenoma of rectum, low anterior rectum, cT2N1M0, stage IIIA -I reviewed his CT scan, endoscopy, and biopsy results in details with patient -His CT scan reviewed no definitive evidence of distant metastasis, however he a 1.4 cm indeterminate liver lesion, need further workup.  -His pelvic MRI showed T2N1 disease, I reviewed with patient -His abdominal MRI showed indeterminate liver lesion, needs close follow-up. He also has imaging evidence of liver cirrhosis, unclear etiology, I'll check his AFP to ruled out hepatocellular carcinoma. -We reviewed the natural history of rectal cancer and treatment options -Given his locally advanced stage IIIA disease, I recommended him to have neoadjuvant chemoradiation. Option of Xeloda and 5-fu infusion were discussed with patient in detail, he opted Xeloda  --Chemotherapy consent: Side effects including but does not not limited to, fatigue, nausea, vomiting, diarrhea, hair loss, neuropathy, fluid retention, renal and kidney dysfunction, neutropenic fever, needed for blood transfusion, bleeding, coronary artery spasm and heart attack, were discussed with patient in great detail. He agrees to proceed if needed. 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 HTN, chronic systolic heart failure with EF 20-25% -He will continue follow-up with his primary care physician and cardiologist -Continue medication  3. Iron deficient anemia -His on study in 2016 was consistent with iron deficiency -He is on oral iron, we'll continue -I'll repeat his CBC and iron studies today, to  see if he needs IV iron.  4. Liver lesion -He has a 1.4 cm indeterminate liver lesion in the left lobe, found on his staging CT scan, and was evaluated by MRI, no typical MRI image characteristics of hepatocellular carcinoma, however image quality was limited. Giving the imaging evidence of liver cirrhosis, I'll check AFP. -will follow up closely with repeated CT or MRI in 2-3 months   Plan -he will start concurrent chemotherapy and irradiation next week 12/14 -lab weekly when he is on treatment -I will see him back on 12/20   All questions were answered. The patient knows to call the clinic with any problems, questions or concerns. I spent 25 minutes counseling the patient face to face. The total time spent in the appointment was 30 minutes and more than 50% was on counseling.     Truitt Merle, MD 05/18/2016 7:24 AM

## 2016-05-19 ENCOUNTER — Ambulatory Visit (HOSPITAL_BASED_OUTPATIENT_CLINIC_OR_DEPARTMENT_OTHER): Payer: Medicare Other | Admitting: Hematology

## 2016-05-19 ENCOUNTER — Ambulatory Visit (HOSPITAL_BASED_OUTPATIENT_CLINIC_OR_DEPARTMENT_OTHER): Payer: Medicare Other

## 2016-05-19 ENCOUNTER — Ambulatory Visit
Admission: RE | Admit: 2016-05-19 | Discharge: 2016-05-19 | Disposition: A | Discharge status: Home / Self Care | Payer: Medicare Other | Source: Ambulatory Visit | Attending: Radiation Oncology | Admitting: Radiation Oncology

## 2016-05-19 ENCOUNTER — Other Ambulatory Visit: Payer: Self-pay | Admitting: Family Medicine

## 2016-05-19 VITALS — BP 117/53 | HR 52 | Temp 97.6°F | Resp 18 | Ht 69.0 in | Wt 198.7 lb

## 2016-05-19 DIAGNOSIS — C20 Malignant neoplasm of rectum: Secondary | ICD-10-CM | POA: Diagnosis not present

## 2016-05-19 DIAGNOSIS — I2510 Atherosclerotic heart disease of native coronary artery without angina pectoris: Secondary | ICD-10-CM | POA: Diagnosis not present

## 2016-05-19 DIAGNOSIS — D509 Iron deficiency anemia, unspecified: Secondary | ICD-10-CM

## 2016-05-19 DIAGNOSIS — D500 Iron deficiency anemia secondary to blood loss (chronic): Secondary | ICD-10-CM

## 2016-05-19 DIAGNOSIS — Z79899 Other long term (current) drug therapy: Secondary | ICD-10-CM | POA: Diagnosis not present

## 2016-05-19 DIAGNOSIS — R7303 Prediabetes: Secondary | ICD-10-CM | POA: Diagnosis not present

## 2016-05-19 DIAGNOSIS — M1990 Unspecified osteoarthritis, unspecified site: Secondary | ICD-10-CM | POA: Diagnosis not present

## 2016-05-19 DIAGNOSIS — N400 Enlarged prostate without lower urinary tract symptoms: Secondary | ICD-10-CM | POA: Diagnosis not present

## 2016-05-19 DIAGNOSIS — I5022 Chronic systolic (congestive) heart failure: Secondary | ICD-10-CM

## 2016-05-19 DIAGNOSIS — Z8249 Family history of ischemic heart disease and other diseases of the circulatory system: Secondary | ICD-10-CM | POA: Diagnosis not present

## 2016-05-19 DIAGNOSIS — Z7982 Long term (current) use of aspirin: Secondary | ICD-10-CM | POA: Diagnosis not present

## 2016-05-19 DIAGNOSIS — K5730 Diverticulosis of large intestine without perforation or abscess without bleeding: Secondary | ICD-10-CM | POA: Diagnosis not present

## 2016-05-19 DIAGNOSIS — I10 Essential (primary) hypertension: Secondary | ICD-10-CM

## 2016-05-19 DIAGNOSIS — K769 Liver disease, unspecified: Secondary | ICD-10-CM

## 2016-05-19 DIAGNOSIS — Z825 Family history of asthma and other chronic lower respiratory diseases: Secondary | ICD-10-CM | POA: Diagnosis not present

## 2016-05-19 DIAGNOSIS — E785 Hyperlipidemia, unspecified: Secondary | ICD-10-CM | POA: Diagnosis not present

## 2016-05-19 DIAGNOSIS — Z833 Family history of diabetes mellitus: Secondary | ICD-10-CM | POA: Diagnosis not present

## 2016-05-19 DIAGNOSIS — Z809 Family history of malignant neoplasm, unspecified: Secondary | ICD-10-CM | POA: Diagnosis not present

## 2016-05-19 DIAGNOSIS — I110 Hypertensive heart disease with heart failure: Secondary | ICD-10-CM | POA: Diagnosis not present

## 2016-05-19 DIAGNOSIS — Z87891 Personal history of nicotine dependence: Secondary | ICD-10-CM | POA: Diagnosis not present

## 2016-05-19 DIAGNOSIS — Z823 Family history of stroke: Secondary | ICD-10-CM | POA: Diagnosis not present

## 2016-05-19 DIAGNOSIS — I509 Heart failure, unspecified: Secondary | ICD-10-CM | POA: Diagnosis not present

## 2016-05-19 DIAGNOSIS — Z510 Encounter for antineoplastic radiation therapy: Secondary | ICD-10-CM | POA: Diagnosis not present

## 2016-05-19 DIAGNOSIS — K7689 Other specified diseases of liver: Secondary | ICD-10-CM | POA: Diagnosis not present

## 2016-05-19 LAB — CBC WITH DIFFERENTIAL/PLATELET
BASO%: 0.5 % (ref 0.0–2.0)
Basophils Absolute: 0 10*3/uL (ref 0.0–0.1)
EOS ABS: 0.1 10*3/uL (ref 0.0–0.5)
EOS%: 0.9 % (ref 0.0–7.0)
HEMATOCRIT: 36 % — AB (ref 38.4–49.9)
HEMOGLOBIN: 12 g/dL — AB (ref 13.0–17.1)
LYMPH%: 13 % — ABNORMAL LOW (ref 14.0–49.0)
MCH: 27.5 pg (ref 27.2–33.4)
MCHC: 33.3 g/dL (ref 32.0–36.0)
MCV: 82.4 fL (ref 79.3–98.0)
MONO#: 0.6 10*3/uL (ref 0.1–0.9)
MONO%: 9.9 % (ref 0.0–14.0)
NEUT#: 4.4 10*3/uL (ref 1.5–6.5)
NEUT%: 75.7 % — ABNORMAL HIGH (ref 39.0–75.0)
Platelets: 107 10*3/uL — ABNORMAL LOW (ref 140–400)
RBC: 4.37 10*6/uL (ref 4.20–5.82)
RDW: 15.4 % — ABNORMAL HIGH (ref 11.0–14.6)
WBC: 5.9 10*3/uL (ref 4.0–10.3)
lymph#: 0.8 10*3/uL — ABNORMAL LOW (ref 0.9–3.3)

## 2016-05-19 LAB — COMPREHENSIVE METABOLIC PANEL
ALT: 22 U/L (ref 0–55)
AST: 26 U/L (ref 5–34)
Albumin: 4.2 g/dL (ref 3.5–5.0)
Alkaline Phosphatase: 98 U/L (ref 40–150)
Anion Gap: 11 mEq/L (ref 3–11)
BUN: 31.9 mg/dL — AB (ref 7.0–26.0)
CALCIUM: 9.5 mg/dL (ref 8.4–10.4)
CHLORIDE: 101 meq/L (ref 98–109)
CO2: 31 mEq/L — ABNORMAL HIGH (ref 22–29)
CREATININE: 1.6 mg/dL — AB (ref 0.7–1.3)
EGFR: 43 mL/min/{1.73_m2} — ABNORMAL LOW (ref >90)
Glucose: 125 mg/dl (ref 70–140)
Potassium: 3.6 mEq/L (ref 3.5–5.1)
Sodium: 144 mEq/L (ref 136–145)
Total Bilirubin: 1.56 mg/dL — ABNORMAL HIGH (ref 0.20–1.20)
Total Protein: 7.8 g/dL (ref 6.4–8.3)

## 2016-05-19 NOTE — Progress Notes (Signed)
[place]  [phone] [phone]  Clinic Follow up Note   Patient Care Team: Patrick Hua, MD as PCP - General (Family Medicine) Patrick Ruff, MD as Consulting Physician (General Surgery) Patrick Rudd, MD as Consulting Physician (Radiation Oncology) Patrick Merle, MD as Consulting Physician (Hematology) Patrick Ade, RN as Registered Nurse 05/19/2016  CHIEF COMPLAINTS:  Follow up rectal cancer  Oncology History   Adenocarcinoma of rectum [facility])   Staging form: Colon and Rectum, AJCC 7th Edition   - Clinical stage from 04/01/2016: T2, N1 - Signed by Patrick Merle, MD on 05/05/2016      Adenocarcinoma of rectum ([place])   04/01/2016 Initial Diagnosis    Adenocarcinoma of rectum ([place])      04/01/2016 Procedure    Colonoscopy showed a fungating nonobstructing medium-sized mass in the distal rectum, measured 3 cm in lengths. Diverticulosis in the sigmoid colon.      04/01/2016 Initial Biopsy    Rectal mass biopsy showed adenocarcinoma, grade 2      04/04/2016 Imaging    CT chest, abdomen and pelvis with contrast showed no primary rectal carcinoma is not well visualized, 7 mm left posterior perirectal and presacral lymph nodes, indeterminate 1.4 cm low to attenuation lesion in the left hepatic lobe, question hepatic cirrhosis, recommend abdominal MRI for further evaluation. No other evidence of distant metastasis.      04/17/2016 Imaging    MR abdomen w w/o contrast IMPRESSION: 1. Morphologic features of liver compatible cirrhosis. 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 2. There is a complex lesion within the left kidney which is only partially visualized on this study which was tailored to assess the liver. When compared with study from 04/04/2016 this is favored to represent a small solid enhancing neoplasm, i.e. renal cell carcinoma. Urologic consultation is recommended. 3. The indeterminate lesion within the left  lobe of liver described on recent CT remains indeterminate. This large reflects motion artifact which degrades the post-contrast sequences. There are no specific findings associated with this lesion to suggest [place] however correlation with AFP levels would be advised. Additionally, in light of the patient's increased risk for hepatoma and liver metastases, followup imaging with repeat contrast enhanced MRI abdomen in 3-6 months is advised to ensure stability of this lesion.      05/04/2016 Imaging    MR Pelvis w w/o contrast 05/04/16 IMPRESSION: Low rectal adenocarcinoma, T2 N1 MX by imaging. Distance from inferior tumor margin to anorectal junction is <1 cm.       HISTORY OF PRESENTING ILLNESS (04/15/2016):  Patrick [name] 65 y.o. male is here because of his recent diagnosed rectal cancer. He presents to our multidisciplinary GI clinic by himself today.  He has had intermittent rectal bleeding for a few years, which she contributes to hemorrhoids, slightly worse lately. He used to have regular bowel movement twice a day, which has been more irregular lately, and occasional loose or watery bowel movement. No significant rectal pain. He was seen by his urologist for routine follow-up of his BPH, and on the rectal exam rectal mass was palpated, and he was referred to colorectal surgeon Dr. Marcello [name]. Colonoscopy on 04/01/2016 showed a fungating nonobstructing medium-sized mass in the distal rectum, 3 cm in length. Biopsy showed adenocarcinoma grade 2. CT CAP was negative distant metastasis, except a 1.4cm indeterminate lesion in the liver.  Review of systems also reviewed slightly decreased appetite, no signal weight loss. No other pain, nausea, or other  symptoms. He has significant cardiomyopathy, nonischemic, has orthopnea, mild, able to function and tolerating routine activity at home. He is the primary caregiver of his sister, who is continuous nursing home. He is single, no children. Retired. No  family history of colon cancer.  CURRENT THERAPY: Concurrent chemotherapy and irradiation with Xeloda 2031m in am and 15042mpm. Started on 05/12/16.  INTERIM HISTORY: [name] for follow-up. He started concurrent chemoradiation 1 week ago. States things are not going bad. Reports loose stools 2-3 times a day. One episode of nausea yesterday that lasted about 10 minutes. Reports blood in his stool about once a week. SOB on exertion. Due to systolic heart failure, he can only drink about 40 oz a day.  MEDICAL HISTORY:  Past Medical History:  Diagnosis Date  . Arthritis    "right hand; right knee" (06/19/2014)  . CAD (coronary artery disease)    2v CAD with subtotal occlusion of small co-dominant RCA and borderline lesion in moderate-sized OM-1  . CHF (congestive heart failure) (HCC)    Preserved EF  . Degenerative joint disease of knee, right   . Enlarged prostate   . Hyperlipidemia   . Hypertension   . Pneumonia 05/2014  . Prediabetes 10/03/2014  . Scrotal edema 04/03/2015  . SMALL BOWEL OBSTRUCTION, HX OF 08/21/2007   Annotation: with narrowing in the ileocecal region Qualifier: Diagnosis of  By: Patrick [name], Patrick [name]    SURGICAL HISTORY: Past Surgical History:  Procedure Laterality Date  . CARDIAC CATHETERIZATION N/A 02/16/2016   Procedure: Right/Left Heart Cath and Coronary Angiography;  Surgeon: Patrick [name];  [facility];  Service: Cardiovascular;  Laterality: N/A;  . COLONOSCOPY N/A 04/01/2016   Procedure: COLONOSCOPY;  Surgeon: Patrick [name];  Location: [facility];  Service: Endoscopy;  Laterality: N/A;  . INGUINAL HERNIA REPAIR Left 1990's    SOCIAL HISTORY: Social History   Social History  . Marital status: Single    Spouse name: N/A  . Number of children: N/A  . Years of education: N/A   Occupational History  . Not on file.   Social History Main Topics  . Smoking status: Former Smoker    Years: 5.00    Types: Pipe,  CiLandscape architect. Smokeless tobacco: Never Used     Comment: 06/19/2014 "stopped smoking in ~ 2014; used to smoke a pipe or cigar a couple times/month"  . Alcohol use No  . Drug use: No  . Sexual activity: No   Other Topics Concern  . Not on file   Social History Narrative   Single, lives w/his sister of whom he is caregiver       FAMILY HISTORY: Family History  Problem Relation Age of Onset  . Hypertension Mother   . Diabetes Mother   . Stroke Mother   . Cancer Father [id number]  Prostate  . Dementia Father   . COPD Sister   . Arthritis Sister   . Edema Sister     ALLERGIES:  has No Known Allergies.  MEDICATIONS:  Current Outpatient Prescriptions  Medication Sig Dispense Refill  . acetaminophen (TYLENOL) 500 MG tablet Take 1,000 mg by mouth every 6 (six) hours as needed for mild pain.    . Ascorbic Acid (VITAMIN C) 1000 MG tablet Take 2,000 mg by mouth 2 (two) times daily.    . Marland Kitchenspirin EC 81 MG EC tablet Take 1 tablet (81 mg total) by mouth daily. 30 tablet 0  .  capecitabine (XELODA) 500 MG tablet Take #4 tab in morning and #3 tab in evening on days of radiation-Monday-Friday 210 tablet 0  . carvedilol (COREG) 25 MG tablet Take 1 tablet (25 mg total) by mouth 2 (two) times daily. 180 tablet 3  . ferrous sulfate 325 (65 FE) MG tablet take 1 tablet by mouth once daily WITH BREAKFAST 30 tablet 3  . finasteride (PROSCAR) 5 MG tablet take 1 tablet by mouth once daily 90 tablet 1  . losartan (COZAAR) 100 MG tablet Take 1 tablet (100 mg total) by mouth daily. 30 tablet 11  . lovastatin (MEVACOR) 20 MG tablet Take 1 tablet (20 mg total) by mouth at bedtime. 90 tablet 1  . metolazone (ZAROXOLYN) 2.5 MG tablet Take 1 tablet (2.5 mg total) by mouth once a week as needed for extra edema/weight gain. 30 tablet 3  . mirtazapine (REMERON SOL-TAB) 15 MG disintegrating tablet place 1 tablet ON TONGUE at bedtime (Patient taking differently: Take 15 mg by mouth at bedtime. place 1 tablet ON TONGUE at  bedtime) 90 tablet 0  . Multiple Vitamins-Minerals (MULTIVITAMIN WITH MINERALS) tablet Take 1 tablet by mouth daily.    Marland Kitchen omega-3 acid ethyl esters (LOVAZA) 1 g capsule take 2 capsules by mouth twice a day 360 capsule 1  . oxyCODONE-acetaminophen (PERCOCET/ROXICET) 5-325 MG tablet Take 1 tablet by mouth every 8 (eight) hours as needed for severe pain. 90 tablet 0  . potassium chloride SA (K-DUR,KLOR-CON) 20 MEQ tablet TAKE 3 TABLET BY MOUTH EVERY DAY (Patient taking differently: Take 20 mEq by mouth 3 (three) times daily. ) 270 tablet 3  . tamsulosin (FLOMAX) 0.4 MG CAPS capsule Take 0.4 mg by mouth daily.     Marland Kitchen torsemide (DEMADEX) 20 MG tablet take 4 tablets by mouth twice a day 720 tablet 0  . traZODone (DESYREL) 100 MG tablet take 1 tablet by mouth twice a day 180 tablet 1  . nitroGLYCERIN (NITROSTAT) 0.4 MG SL tablet Place 1 tablet (0.4 mg total) under the tongue every 5 (five) minutes as needed for chest pain. (Patient not taking: Reported on 05/12/2016) 25 tablet 3   No current facility-administered medications for this visit.     REVIEW OF SYSTEMS:   Constitutional: Denies fevers, chills or abnormal night sweats Eyes: Denies blurriness of vision, double vision or watery eyes Ears, nose, mouth, throat, and face: Denies mucositis or sore throat Respiratory: Denies cough, dyspnea or wheezes (+) SOB on exertion. Cardiovascular: Denies palpitation, chest discomfort or lower extremity swelling Gastrointestinal:  Denies heartburn. (+) nausea. (+) loose stool (+) blood in stool Skin: Denies abnormal skin rashes Lymphatics: Denies new lymphadenopathy or easy bruising Neurological:Denies numbness, tingling or new weaknesses Behavioral/Psych: Mood is stable, no new changes  All other systems were reviewed with the patient and are negative.  PHYSICAL EXAMINATION: ECOG PERFORMANCE STATUS: 1 - Symptomatic but completely ambulatory  Vitals:   05/19/16 1549  BP: (!) 117/53  Pulse: (!) 52    Resp: 18  Temp: 97.6 F (36.4 C)   Filed Weights   05/19/16 1549  Weight: 198 lb 11.2 oz (90.1 kg)    GENERAL:alert, no distress and comfortable SKIN: skin color, texture, turgor are normal, no rashes or significant lesions EYES: normal, conjunctiva are pink and non-injected, sclera clear OROPHARYNX:no exudate, no erythema and lips, buccal mucosa, and tongue normal  NECK: supple, thyroid normal size, non-tender, without nodularity LYMPH:  no palpable lymphadenopathy in the cervical, axillary or inguinal LUNGS: clear to auscultation and  percussion with normal breathing effort HEART: regular rate & rhythm and no murmurs and no lower extremity edema ABDOMEN:abdomen soft, non-tender and normal bowel sounds, rectal exam showed a firm mass in the low rectum anteriorly, no bleeding. Bilateral inguinal lymph nodes were negative. Musculoskeletal:no cyanosis of digits and no clubbing  PSYCH: alert & oriented x 3 with fluent speech NEURO: no focal motor/sensory deficits  LABORATORY DATA:  I have reviewed the data as listed CBC Latest Ref Rng & Units 05/19/2016 04/15/2016 02/09/2016  WBC 4.0 - 10.3 10e3/uL 5.9 7.1 6.2  Hemoglobin 13.0 - 17.1 g/dL 12.0(L) 11.0(L) 12.5(L)  Hematocrit 38.4 - 49.9 % 36.0(L) 33.0(L) 38.0(L)  Platelets 140 - 400 10e3/uL 107(L) 125(L) 109(L)   CMP Latest Ref Rng & Units 05/19/2016 04/28/2016 04/15/2016  Glucose 70 - 140 mg/dl 125 93 130  BUN 7.0 - 26.0 mg/dL 31.9(H) 13 10.6  Creatinine 0.7 - 1.3 mg/dL 1.6(H) 1.49(H) 1.2  Sodium 136 - 145 mEq/L 144 143 146(H)  Potassium 3.5 - 5.1 mEq/L 3.6 4.0 3.8  Chloride 98 - 110 mmol/L - 105 -  CO2 22 - 29 mEq/L 31(H) 29 29  Calcium 8.4 - 10.4 mg/dL 9.5 8.5(L) 9.2  Total Protein 6.4 - 8.3 g/dL 7.8 - 7.4  Total Bilirubin 0.20 - 1.20 mg/dL 1.56(H) - 0.90  Alkaline Phos 40 - 150 U/L 98 - 98  AST 5 - 34 U/L 26 - 23  ALT 0 - 55 U/L 22 - 21   PATHOLOGY REPORT  Diagnosis 04/01/2016 Rectum, biopsy, mass INVASIVE  ADENOCARCINOMA, GRADE 2 MARGIN OF RESECTION IS POSITIVE FOR ADENOCARCINOMA  MMR-normal   RADIOGRAPHIC STUDIES: I have personally reviewed the radiological images as listed and agreed with the findings in the report. Mr Pelvis W Wo Contrast  Result Date: 05/04/2016 CLINICAL DATA:  Newly diagnosed rectal adenocarcinoma. EXAM: MRI PELVIS WITHOUT AND WITH CONTRAST TECHNIQUE: Multiplanar multisequence MR imaging of the pelvis was performed both before and after administration of intravenous contrast. CONTRAST:  39m MULTIHANCE GADOBENATE DIMEGLUMINE 529 MG/ML IV SOLN COMPARISON:  None. FINDINGS: Location of Tumor in Rectum:  Low Tumor Description: Nodular soft tissue mass is seen along the anterior and right lateral walls of the rectum Length of Tumor: 4.6 cm Distance from inferior tumor margin to internal sphincter/anorectal junction: <1 cm Tumor Extension through Muscularis Propria: No (T2) Adjacent Organ Involvement: No Levator Ani Involvement:  No Perirectal Lymph Nodes >=523m A left posterior perirectal lymph node is seen measuring 9 mm on image 9/3. A right posterior perirectal lymph node is seen measuring 7 mm on image 6/3. (N1) Other: Mild to moderately enlarged prostate gland, with numerous BPH nodules and mass effect on bladder base. IMPRESSION: Low rectal adenocarcinoma, T2 N1 MX by imaging. Distance from inferior tumor margin to anorectal junction is <1 cm. Electronically Signed   By: JoEarle Gell.D.   On: 05/04/2016 19:40   COLONOSCOPY 04/01/2016 Dr. ThMarcello MooresA fungating non-obstructing medium-sized mass was found in the distal rectum. The mass was noncircumferential. The mass measured three cm in length. No bleeding was present. Biopsies were taken with a cold forceps for histology. IMPRESSION  - Diverticulosis in the sigmoid colon. 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 Biopsied.  ASSESSMENT & PLAN: [age].[id number]. Caucasian male, with past medical history of hypertension, chronic systolic heart  failure with EF of 20-25%, iron deficient anemia, presented with a palpable rectal mass.  1. Adenoma of rectum, low anterior rectum, cT2N1M0, stage IIIA -I reviewed his CT scan,  endoscopy, and biopsy results in details with patient -His CT scan reviewed no definitive evidence of distant metastasis, however he a 1.4 cm indeterminate liver lesion, need further workup.  -His pelvic MRI showed T2N1 disease, I reviewed with patient -His abdominal MRI showed indeterminate liver lesion, needs close follow-up. He also has imaging evidence of liver cirrhosis, unclear etiology, I'll check his AFP to ruled out hepatocellular carcinoma. -We reviewed the natural history of rectal cancer and treatment options -Given his locally advanced stage IIIA disease, I recommended him to have neoadjuvant chemoradiation. Option of Xeloda and 5-fu infusion were discussed with patient in detail, he opted Xeloda  -He has significant not ischemia cardiomyopathy, which needs to be watched closely during chemo and radiation  -He started concurrent chemoradiation 1 week ago, tolerating well so far. Laboratory reviewed, he has slightly worsening renal function, and slightly increased total bilirubin. This could be related to Xeloda, we'll continue and monitor closely. If he has significant worsening kidney function next week, I may hold Xeloda.  2. HTN, chronic systolic heart failure with EF 20-25% -He will continue follow-up with his primary care physician and cardiologist -Creatinine 1.6 on 05/19/16, worse than before. Hold the Metolazone and decrease the Torsemide to 3 tablets twice daily. -Slightly increase his water intake to 40-50 oz a day. -I'll copy his cardiologist  3. Iron deficient anemia -His on study in 2016 was consistent with iron deficiency -He is on oral iron, we'll continue -Iron on 04/15/16 was 44. Do not need IV iron at this time.  4. Liver lesion -He has a 1.4 cm indeterminate liver lesion in the left  lobe, found on his staging CT scan, and was evaluated by MRI, no typical MRI image characteristics of hepatocellular carcinoma, however image quality was limited. Giving the imaging evidence of liver cirrhosis, I'll check AFP. -MRI of the abd on 04/17/16 shows the liver lesion remains indeterminate. Will repeat in the future.  Plan -Started chemoradiation 05/12/16, week 2 now  -due to his worsening renal function, I'll descalate his diuretics. I'll copy his cardiologist Dr. Earney Hamburg  -If his liver function continue getting worse next week, I may decrease or hold Xeloda -lab weekly when he is on treatment -I will see him back on 06/02/16.  All questions were answered. The patient knows to call the clinic with any problems, questions or concerns. I spent 20 minutes counseling the patient face to face. The total time spent in the appointment was 25 minutes and more than 50% was on counseling.     Patrick Merle, MD 05/19/2016 This document serves as a record of services personally performed by Patrick Merle, MD. It was created on her behalf by Darcus Austin, a trained medical scribe. The creation of this record is based on the scribe's personal observations and the provider's statements to them. This document has been checked and approved by the attending provider.

## 2016-05-19 NOTE — Progress Notes (Signed)
Open in error

## 2016-05-20 ENCOUNTER — Ambulatory Visit
Admission: RE | Admit: 2016-05-20 | Discharge: 2016-05-20 | Disposition: A | Discharge status: Home / Self Care | Payer: Medicare Other | Source: Ambulatory Visit | Attending: Radiation Oncology | Admitting: Radiation Oncology

## 2016-05-20 ENCOUNTER — Ambulatory Visit (HOSPITAL_COMMUNITY): Payer: Medicare Other | Attending: Cardiology

## 2016-05-20 ENCOUNTER — Other Ambulatory Visit: Payer: Self-pay

## 2016-05-20 ENCOUNTER — Encounter: Payer: Self-pay | Admitting: Radiation Oncology

## 2016-05-20 ENCOUNTER — Encounter: Payer: Self-pay | Admitting: Hematology

## 2016-05-20 VITALS — BP 114/64 | HR 49 | Temp 97.8°F | Resp 20 | Wt 198.6 lb

## 2016-05-20 DIAGNOSIS — Z825 Family history of asthma and other chronic lower respiratory diseases: Secondary | ICD-10-CM | POA: Diagnosis not present

## 2016-05-20 DIAGNOSIS — I509 Heart failure, unspecified: Secondary | ICD-10-CM | POA: Insufficient documentation

## 2016-05-20 DIAGNOSIS — E785 Hyperlipidemia, unspecified: Secondary | ICD-10-CM | POA: Diagnosis not present

## 2016-05-20 DIAGNOSIS — E669 Obesity, unspecified: Secondary | ICD-10-CM | POA: Insufficient documentation

## 2016-05-20 DIAGNOSIS — R7303 Prediabetes: Secondary | ICD-10-CM | POA: Diagnosis not present

## 2016-05-20 DIAGNOSIS — C20 Malignant neoplasm of rectum: Secondary | ICD-10-CM

## 2016-05-20 DIAGNOSIS — Z8249 Family history of ischemic heart disease and other diseases of the circulatory system: Secondary | ICD-10-CM | POA: Diagnosis not present

## 2016-05-20 DIAGNOSIS — I255 Ischemic cardiomyopathy: Secondary | ICD-10-CM | POA: Diagnosis not present

## 2016-05-20 DIAGNOSIS — M1990 Unspecified osteoarthritis, unspecified site: Secondary | ICD-10-CM | POA: Diagnosis not present

## 2016-05-20 DIAGNOSIS — K5730 Diverticulosis of large intestine without perforation or abscess without bleeding: Secondary | ICD-10-CM | POA: Diagnosis not present

## 2016-05-20 DIAGNOSIS — I081 Rheumatic disorders of both mitral and tricuspid valves: Secondary | ICD-10-CM | POA: Insufficient documentation

## 2016-05-20 DIAGNOSIS — I110 Hypertensive heart disease with heart failure: Secondary | ICD-10-CM | POA: Diagnosis not present

## 2016-05-20 DIAGNOSIS — K769 Liver disease, unspecified: Secondary | ICD-10-CM | POA: Diagnosis not present

## 2016-05-20 DIAGNOSIS — Z833 Family history of diabetes mellitus: Secondary | ICD-10-CM | POA: Diagnosis not present

## 2016-05-20 DIAGNOSIS — N400 Enlarged prostate without lower urinary tract symptoms: Secondary | ICD-10-CM | POA: Diagnosis not present

## 2016-05-20 DIAGNOSIS — Z6829 Body mass index (BMI) 29.0-29.9, adult: Secondary | ICD-10-CM | POA: Diagnosis not present

## 2016-05-20 DIAGNOSIS — Z7982 Long term (current) use of aspirin: Secondary | ICD-10-CM | POA: Diagnosis not present

## 2016-05-20 DIAGNOSIS — Z87891 Personal history of nicotine dependence: Secondary | ICD-10-CM | POA: Insufficient documentation

## 2016-05-20 DIAGNOSIS — Z823 Family history of stroke: Secondary | ICD-10-CM | POA: Diagnosis not present

## 2016-05-20 DIAGNOSIS — Z510 Encounter for antineoplastic radiation therapy: Secondary | ICD-10-CM | POA: Diagnosis not present

## 2016-05-20 DIAGNOSIS — I2510 Atherosclerotic heart disease of native coronary artery without angina pectoris: Secondary | ICD-10-CM | POA: Diagnosis not present

## 2016-05-20 DIAGNOSIS — Z809 Family history of malignant neoplasm, unspecified: Secondary | ICD-10-CM | POA: Diagnosis not present

## 2016-05-20 DIAGNOSIS — Z79899 Other long term (current) drug therapy: Secondary | ICD-10-CM | POA: Diagnosis not present

## 2016-05-20 LAB — ECHOCARDIOGRAM COMPLETE
AOASC: 32 cm
CHL CUP DOP CALC LVOT VTI: 17.7 cm
CHL CUP MV DEC (S): 222
CHL CUP STROKE VOLUME: 49 mL
E decel time: 222 msec
E/e' ratio: 14.47
FS: 21 % — AB (ref 28–44)
IVS/LV PW RATIO, ED: 1.14
LA diam index: 2.67 cm/m2
LA vol A4C: 101 ml
LA vol: 113 mL
LASIZE: 55 mm
LAVOLIN: 54.9 mL/m2
LDCA: 3.46 cm2
LEFT ATRIUM END SYS DIAM: 55 mm
LV E/e' medial: 14.47
LV E/e'average: 14.47
LV PW d: 11.6 mm — AB (ref 0.6–1.1)
LV dias vol: 114 mL (ref 62–150)
LV sys vol index: 32 mL/m2
LV sys vol: 65 mL — AB (ref 21–61)
LVDIAVOLIN: 55 mL/m2
LVELAT: 7.12 cm/s
LVOT diameter: 21 mm
LVOT peak vel: 82.5 cm/s
LVOTSV: 61 mL
MV pk A vel: 56.9 m/s
MV pk E vel: 103 m/s
MVPG: 4 mmHg
RV LATERAL S' VELOCITY: 15.5 cm/s
Simpson's disk: 43
TDI e' lateral: 7.12
TDI e' medial: 4.58
Weight: 3177.6 oz

## 2016-05-20 LAB — AFP TUMOR MARKER: AFP, SERUM, TUMOR MARKER: 5 ng/mL (ref 0.0–8.3)

## 2016-05-20 MED ORDER — PROCHLORPERAZINE MALEATE 10 MG PO TABS: 10.0000 mg | ORAL_TABLET | Freq: Four times a day (QID) | ORAL | 5 refills | Status: DC | PRN
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
## 2016-05-23 ENCOUNTER — Other Ambulatory Visit: Payer: Self-pay | Admitting: Family Medicine

## 2016-05-24 ENCOUNTER — Ambulatory Visit
Admission: RE | Admit: 2016-05-24 | Discharge: 2016-05-24 | Disposition: A | Discharge status: Home / Self Care | Payer: Medicare Other | Source: Ambulatory Visit | Attending: Radiation Oncology | Admitting: Radiation Oncology

## 2016-05-24 DIAGNOSIS — I509 Heart failure, unspecified: Secondary | ICD-10-CM | POA: Diagnosis not present

## 2016-05-24 DIAGNOSIS — K769 Liver disease, unspecified: Secondary | ICD-10-CM | POA: Diagnosis not present

## 2016-05-24 DIAGNOSIS — M1990 Unspecified osteoarthritis, unspecified site: Secondary | ICD-10-CM | POA: Diagnosis not present

## 2016-05-24 DIAGNOSIS — Z809 Family history of malignant neoplasm, unspecified: Secondary | ICD-10-CM | POA: Diagnosis not present

## 2016-05-24 DIAGNOSIS — I2510 Atherosclerotic heart disease of native coronary artery without angina pectoris: Secondary | ICD-10-CM | POA: Diagnosis not present

## 2016-05-24 DIAGNOSIS — Z7982 Long term (current) use of aspirin: Secondary | ICD-10-CM | POA: Diagnosis not present

## 2016-05-24 DIAGNOSIS — Z823 Family history of stroke: Secondary | ICD-10-CM | POA: Diagnosis not present

## 2016-05-24 DIAGNOSIS — Z8249 Family history of ischemic heart disease and other diseases of the circulatory system: Secondary | ICD-10-CM | POA: Diagnosis not present

## 2016-05-24 DIAGNOSIS — I110 Hypertensive heart disease with heart failure: Secondary | ICD-10-CM | POA: Diagnosis not present

## 2016-05-24 DIAGNOSIS — Z87891 Personal history of nicotine dependence: Secondary | ICD-10-CM | POA: Diagnosis not present

## 2016-05-24 DIAGNOSIS — Z833 Family history of diabetes mellitus: Secondary | ICD-10-CM | POA: Diagnosis not present

## 2016-05-24 DIAGNOSIS — K5730 Diverticulosis of large intestine without perforation or abscess without bleeding: Secondary | ICD-10-CM | POA: Diagnosis not present

## 2016-05-24 DIAGNOSIS — R7303 Prediabetes: Secondary | ICD-10-CM | POA: Diagnosis not present

## 2016-05-24 DIAGNOSIS — Z79899 Other long term (current) drug therapy: Secondary | ICD-10-CM | POA: Diagnosis not present

## 2016-05-24 DIAGNOSIS — C20 Malignant neoplasm of rectum: Secondary | ICD-10-CM | POA: Diagnosis not present

## 2016-05-24 DIAGNOSIS — Z510 Encounter for antineoplastic radiation therapy: Secondary | ICD-10-CM | POA: Diagnosis not present

## 2016-05-24 DIAGNOSIS — E785 Hyperlipidemia, unspecified: Secondary | ICD-10-CM | POA: Diagnosis not present

## 2016-05-24 DIAGNOSIS — N400 Enlarged prostate without lower urinary tract symptoms: Secondary | ICD-10-CM | POA: Diagnosis not present

## 2016-05-24 DIAGNOSIS — Z825 Family history of asthma and other chronic lower respiratory diseases: Secondary | ICD-10-CM | POA: Diagnosis not present

## 2016-05-25 ENCOUNTER — Ambulatory Visit
Admission: RE | Admit: 2016-05-25 | Discharge: 2016-05-25 | Disposition: A | Discharge status: Home / Self Care | Payer: Medicare Other | Source: Ambulatory Visit | Attending: Radiation Oncology | Admitting: Radiation Oncology

## 2016-05-25 ENCOUNTER — Telehealth: Payer: Self-pay | Admitting: Cardiology

## 2016-05-25 DIAGNOSIS — I509 Heart failure, unspecified: Secondary | ICD-10-CM | POA: Diagnosis not present

## 2016-05-25 DIAGNOSIS — N400 Enlarged prostate without lower urinary tract symptoms: Secondary | ICD-10-CM | POA: Diagnosis not present

## 2016-05-25 DIAGNOSIS — Z87891 Personal history of nicotine dependence: Secondary | ICD-10-CM | POA: Diagnosis not present

## 2016-05-25 DIAGNOSIS — Z79899 Other long term (current) drug therapy: Secondary | ICD-10-CM | POA: Diagnosis not present

## 2016-05-25 DIAGNOSIS — Z823 Family history of stroke: Secondary | ICD-10-CM | POA: Diagnosis not present

## 2016-05-25 DIAGNOSIS — E785 Hyperlipidemia, unspecified: Secondary | ICD-10-CM | POA: Diagnosis not present

## 2016-05-25 DIAGNOSIS — Z7982 Long term (current) use of aspirin: Secondary | ICD-10-CM | POA: Diagnosis not present

## 2016-05-25 DIAGNOSIS — Z825 Family history of asthma and other chronic lower respiratory diseases: Secondary | ICD-10-CM | POA: Diagnosis not present

## 2016-05-25 DIAGNOSIS — K769 Liver disease, unspecified: Secondary | ICD-10-CM | POA: Diagnosis not present

## 2016-05-25 DIAGNOSIS — Z809 Family history of malignant neoplasm, unspecified: Secondary | ICD-10-CM | POA: Diagnosis not present

## 2016-05-25 DIAGNOSIS — M1990 Unspecified osteoarthritis, unspecified site: Secondary | ICD-10-CM | POA: Diagnosis not present

## 2016-05-25 DIAGNOSIS — Z8249 Family history of ischemic heart disease and other diseases of the circulatory system: Secondary | ICD-10-CM | POA: Diagnosis not present

## 2016-05-25 DIAGNOSIS — R7303 Prediabetes: Secondary | ICD-10-CM | POA: Diagnosis not present

## 2016-05-25 DIAGNOSIS — Z510 Encounter for antineoplastic radiation therapy: Secondary | ICD-10-CM | POA: Diagnosis not present

## 2016-05-25 DIAGNOSIS — C20 Malignant neoplasm of rectum: Secondary | ICD-10-CM | POA: Diagnosis not present

## 2016-05-25 DIAGNOSIS — K5730 Diverticulosis of large intestine without perforation or abscess without bleeding: Secondary | ICD-10-CM | POA: Diagnosis not present

## 2016-05-25 DIAGNOSIS — I110 Hypertensive heart disease with heart failure: Secondary | ICD-10-CM | POA: Diagnosis not present

## 2016-05-25 DIAGNOSIS — Z833 Family history of diabetes mellitus: Secondary | ICD-10-CM | POA: Diagnosis not present

## 2016-05-25 DIAGNOSIS — I2510 Atherosclerotic heart disease of native coronary artery without angina pectoris: Secondary | ICD-10-CM | POA: Diagnosis not present

## 2016-05-25 NOTE — Telephone Encounter (Signed)
Mr. Mender is returning your call. Please call. Thanks.

## 2016-05-25 NOTE — Telephone Encounter (Signed)
Done see result notes. 

## 2016-05-26 ENCOUNTER — Other Ambulatory Visit (HOSPITAL_BASED_OUTPATIENT_CLINIC_OR_DEPARTMENT_OTHER): Payer: Medicare Other

## 2016-05-26 ENCOUNTER — Ambulatory Visit
Admission: RE | Admit: 2016-05-26 | Discharge: 2016-05-26 | Disposition: A | Discharge status: Home / Self Care | Payer: Medicare Other | Source: Ambulatory Visit | Attending: Radiation Oncology | Admitting: Radiation Oncology

## 2016-05-26 ENCOUNTER — Encounter: Payer: Self-pay | Admitting: *Deleted

## 2016-05-26 ENCOUNTER — Telehealth: Payer: Self-pay | Admitting: *Deleted

## 2016-05-26 DIAGNOSIS — N400 Enlarged prostate without lower urinary tract symptoms: Secondary | ICD-10-CM | POA: Diagnosis not present

## 2016-05-26 DIAGNOSIS — M1990 Unspecified osteoarthritis, unspecified site: Secondary | ICD-10-CM | POA: Diagnosis not present

## 2016-05-26 DIAGNOSIS — Z8249 Family history of ischemic heart disease and other diseases of the circulatory system: Secondary | ICD-10-CM | POA: Diagnosis not present

## 2016-05-26 DIAGNOSIS — D509 Iron deficiency anemia, unspecified: Secondary | ICD-10-CM | POA: Diagnosis not present

## 2016-05-26 DIAGNOSIS — C20 Malignant neoplasm of rectum: Secondary | ICD-10-CM

## 2016-05-26 DIAGNOSIS — E785 Hyperlipidemia, unspecified: Secondary | ICD-10-CM | POA: Diagnosis not present

## 2016-05-26 DIAGNOSIS — I110 Hypertensive heart disease with heart failure: Secondary | ICD-10-CM | POA: Diagnosis not present

## 2016-05-26 DIAGNOSIS — Z510 Encounter for antineoplastic radiation therapy: Secondary | ICD-10-CM | POA: Diagnosis not present

## 2016-05-26 DIAGNOSIS — K769 Liver disease, unspecified: Secondary | ICD-10-CM | POA: Diagnosis not present

## 2016-05-26 DIAGNOSIS — Z825 Family history of asthma and other chronic lower respiratory diseases: Secondary | ICD-10-CM | POA: Diagnosis not present

## 2016-05-26 DIAGNOSIS — Z79899 Other long term (current) drug therapy: Secondary | ICD-10-CM | POA: Diagnosis not present

## 2016-05-26 DIAGNOSIS — Z87891 Personal history of nicotine dependence: Secondary | ICD-10-CM | POA: Diagnosis not present

## 2016-05-26 DIAGNOSIS — I509 Heart failure, unspecified: Secondary | ICD-10-CM | POA: Diagnosis not present

## 2016-05-26 DIAGNOSIS — Z7982 Long term (current) use of aspirin: Secondary | ICD-10-CM | POA: Diagnosis not present

## 2016-05-26 DIAGNOSIS — R7303 Prediabetes: Secondary | ICD-10-CM | POA: Diagnosis not present

## 2016-05-26 DIAGNOSIS — K5730 Diverticulosis of large intestine without perforation or abscess without bleeding: Secondary | ICD-10-CM | POA: Diagnosis not present

## 2016-05-26 DIAGNOSIS — Z833 Family history of diabetes mellitus: Secondary | ICD-10-CM | POA: Diagnosis not present

## 2016-05-26 DIAGNOSIS — I2510 Atherosclerotic heart disease of native coronary artery without angina pectoris: Secondary | ICD-10-CM | POA: Diagnosis not present

## 2016-05-26 DIAGNOSIS — Z823 Family history of stroke: Secondary | ICD-10-CM | POA: Diagnosis not present

## 2016-05-26 DIAGNOSIS — Z809 Family history of malignant neoplasm, unspecified: Secondary | ICD-10-CM | POA: Diagnosis not present

## 2016-05-26 LAB — COMPREHENSIVE METABOLIC PANEL
ALBUMIN: 3.6 g/dL (ref 3.5–5.0)
ALK PHOS: 88 U/L (ref 40–150)
ALT: 14 U/L (ref 0–55)
ANION GAP: 9 meq/L (ref 3–11)
AST: 17 U/L (ref 5–34)
BILIRUBIN TOTAL: 1.27 mg/dL — AB (ref 0.20–1.20)
BUN: 17.2 mg/dL (ref 7.0–26.0)
CALCIUM: 8.9 mg/dL (ref 8.4–10.4)
CO2: 28 mEq/L (ref 22–29)
CREATININE: 1.5 mg/dL — AB (ref 0.7–1.3)
Chloride: 106 mEq/L (ref 98–109)
EGFR: 48 mL/min/{1.73_m2} — ABNORMAL LOW (ref >90)
Glucose: 146 mg/dl — ABNORMAL HIGH (ref 70–140)
Potassium: 4.2 mEq/L (ref 3.5–5.1)
Sodium: 143 mEq/L (ref 136–145)
Total Protein: 6.8 g/dL (ref 6.4–8.3)

## 2016-05-26 LAB — CBC WITH DIFFERENTIAL/PLATELET
BASO%: 0.5 % (ref 0.0–2.0)
BASOS ABS: 0 10*3/uL (ref 0.0–0.1)
EOS%: 1.1 % (ref 0.0–7.0)
Eosinophils Absolute: 0.1 10*3/uL (ref 0.0–0.5)
HEMATOCRIT: 31 % — AB (ref 38.4–49.9)
HGB: 10.1 g/dL — ABNORMAL LOW (ref 13.0–17.1)
LYMPH#: 0.4 10*3/uL — AB (ref 0.9–3.3)
LYMPH%: 6.9 % — ABNORMAL LOW (ref 14.0–49.0)
MCH: 27.8 pg (ref 27.2–33.4)
MCHC: 32.7 g/dL (ref 32.0–36.0)
MCV: 84.9 fL (ref 79.3–98.0)
MONO#: 0.6 10*3/uL (ref 0.1–0.9)
MONO%: 10 % (ref 0.0–14.0)
NEUT#: 4.7 10*3/uL (ref 1.5–6.5)
NEUT%: 81.5 % — AB (ref 39.0–75.0)
PLATELETS: 91 10*3/uL — AB (ref 140–400)
RBC: 3.65 10*6/uL — ABNORMAL LOW (ref 4.20–5.82)
RDW: 17.5 % — ABNORMAL HIGH (ref 11.0–14.6)
WBC: 5.8 10*3/uL (ref 4.0–10.3)

## 2016-05-26 LAB — FERRITIN: Ferritin: 70 ng/ml (ref 22–316)

## 2016-05-26 NOTE — Progress Notes (Signed)
Oncology Nurse Navigator Documentation  Oncology Nurse Navigator Flowsheets 05/26/2016  Navigator Location [facility]  Referral date to RadOnc/MedOnc -  Navigator Encounter Type Treatment  Telephone -  Abnormal Finding Date 04/01/2016  Confirmed Diagnosis Date 04/01/2016  Multidisiplinary Clinic Date 04/15/2016  Treatment Initiated Date 05/12/2016  Patient Visit Type RadOnc  Treatment Phase Active Tx--RT/Xeloda  Barriers/Navigation Needs No barriers at this time;No Questions;No Needs  Education -  Interventions Education--reviewed diarrhea management and encourage him to allow outside agency assist with his sister's rehab.  Coordination of Care -  Education Method Verbal  Support Groups/Services -  Acuity Level 1  Time Spent with Patient 15  Reports his main complaint is fatigue. 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 He has to help pull her up from sitting to standing. Says a home health agency is coming out tomorrow to assess if they can help her. Prior agency could not meet his expectations.

## 2016-05-26 NOTE — Telephone Encounter (Signed)
Pt had labs today.  Total bilirubin  1.27 -  Per Dr. Burr Medico , if liver function result better, no worse , then pt can continue Xeloda  Today and Friday.  Jenny Reichmann, NP reviewed lab results.   Spoke with pt and informed pt of lab results, and pt can continue taking Xeloda for today and Friday.  Pt voiced understanding.

## 2016-05-27 ENCOUNTER — Ambulatory Visit
Admission: RE | Admit: 2016-05-27 | Discharge: 2016-05-27 | Disposition: A | Discharge status: Home / Self Care | Payer: Medicare Other | Source: Ambulatory Visit | Attending: Radiation Oncology | Admitting: Radiation Oncology

## 2016-05-27 ENCOUNTER — Encounter: Payer: Self-pay | Admitting: Radiation Oncology

## 2016-05-27 VITALS — BP 121/59 | HR 51 | Temp 97.7°F | Resp 16 | Wt 206.4 lb

## 2016-05-27 DIAGNOSIS — Z809 Family history of malignant neoplasm, unspecified: Secondary | ICD-10-CM | POA: Diagnosis not present

## 2016-05-27 DIAGNOSIS — Z825 Family history of asthma and other chronic lower respiratory diseases: Secondary | ICD-10-CM | POA: Diagnosis not present

## 2016-05-27 DIAGNOSIS — R7303 Prediabetes: Secondary | ICD-10-CM | POA: Diagnosis not present

## 2016-05-27 DIAGNOSIS — C20 Malignant neoplasm of rectum: Secondary | ICD-10-CM

## 2016-05-27 DIAGNOSIS — Z823 Family history of stroke: Secondary | ICD-10-CM | POA: Diagnosis not present

## 2016-05-27 DIAGNOSIS — K769 Liver disease, unspecified: Secondary | ICD-10-CM | POA: Diagnosis not present

## 2016-05-27 DIAGNOSIS — I2510 Atherosclerotic heart disease of native coronary artery without angina pectoris: Secondary | ICD-10-CM | POA: Diagnosis not present

## 2016-05-27 DIAGNOSIS — Z510 Encounter for antineoplastic radiation therapy: Secondary | ICD-10-CM | POA: Diagnosis not present

## 2016-05-27 DIAGNOSIS — I110 Hypertensive heart disease with heart failure: Secondary | ICD-10-CM | POA: Diagnosis not present

## 2016-05-27 DIAGNOSIS — E785 Hyperlipidemia, unspecified: Secondary | ICD-10-CM | POA: Diagnosis not present

## 2016-05-27 DIAGNOSIS — Z79899 Other long term (current) drug therapy: Secondary | ICD-10-CM | POA: Diagnosis not present

## 2016-05-27 DIAGNOSIS — M1990 Unspecified osteoarthritis, unspecified site: Secondary | ICD-10-CM | POA: Diagnosis not present

## 2016-05-27 DIAGNOSIS — K5730 Diverticulosis of large intestine without perforation or abscess without bleeding: Secondary | ICD-10-CM | POA: Diagnosis not present

## 2016-05-27 DIAGNOSIS — Z7982 Long term (current) use of aspirin: Secondary | ICD-10-CM | POA: Diagnosis not present

## 2016-05-27 DIAGNOSIS — Z833 Family history of diabetes mellitus: Secondary | ICD-10-CM | POA: Diagnosis not present

## 2016-05-27 DIAGNOSIS — N400 Enlarged prostate without lower urinary tract symptoms: Secondary | ICD-10-CM | POA: Diagnosis not present

## 2016-05-27 DIAGNOSIS — Z8249 Family history of ischemic heart disease and other diseases of the circulatory system: Secondary | ICD-10-CM | POA: Diagnosis not present

## 2016-05-27 DIAGNOSIS — Z87891 Personal history of nicotine dependence: Secondary | ICD-10-CM | POA: Diagnosis not present

## 2016-05-27 DIAGNOSIS — I509 Heart failure, unspecified: Secondary | ICD-10-CM | POA: Diagnosis not present

## 2016-05-27 NOTE — Progress Notes (Signed)
Weekly rad txs rectum 11/28 completed, says having gas, no cramping, pain in right hip3/10 score, no bladder changes, last bowel movement 2 days ago and only coming  Out in little squirts, feeling bloated, Hasn't eaten today yet, usus ally eats 2 meals a day, drinks 23 oz water daily and other fluids, tea and apple juice,  no coffee,  Has gained 8 lbs since last week, also drinks ensure daily BP (!) 121/59 (BP Location: Left Arm, Patient Position: Sitting, Cuff Size: Normal)   Pulse (!) 51   Temp 97.7 F (36.5 C) (Oral)   Resp 16   Wt 206 lb 6.4 oz (93.6 kg)   BMI 30.48 kg/m   Wt Readings from Last 3 Encounters:  05/27/16 206 lb 6.4 oz (93.6 kg)  05/20/16 198 lb 9.6 oz (90.1 kg)  05/19/16 198 lb 11.2 oz (90.1 kg)   I

## 2016-05-27 NOTE — Progress Notes (Signed)
Department of Radiation Oncology  Phone:  [phone] Fax:        [phone]  Weekly Treatment Note    Name: Patrick [name] Date: 05/27/2016 MRN: [id number] DOB: Dec 07, 1950   Diagnosis:     ICD-9-CM ICD-10-CM   1. 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 acetaminophen (TYLENOL) 500 MG tablet Take 1,000 mg by mouth every 6 (six) hours as needed for mild pain.    . Ascorbic Acid (VITAMIN C) 1000 MG tablet Take 2,000 mg by mouth 2 (two) times daily.    Marland Kitchen aspirin EC 81 MG EC tablet Take 1 tablet (81 mg total) by mouth daily. 30 tablet 0  . capecitabine (XELODA) 500 MG tablet Take #4 tab in morning and #3 tab in evening on days of radiation-Monday-Friday 210 tablet 0  . carvedilol (COREG) 25 MG tablet Take 1 tablet (25 mg total) by mouth 2 (two) times daily. 180 tablet 3  . ferrous sulfate 325 (65 FE) MG tablet take 1 tablet by mouth once daily WITH BREAKFAST 30 tablet 3  . finasteride (PROSCAR) 5 MG tablet take 1 tablet by mouth once daily 90 tablet 1  . losartan (COZAAR) 100 MG tablet Take 1 tablet (100 mg total) by mouth daily. 30 tablet 11  . lovastatin (MEVACOR) 20 MG tablet take 1 tablet by mouth at bedtime 90 tablet 1  . mirtazapine (REMERON SOL-TAB) 15 MG disintegrating tablet place 1 tablet ON TONGUE at bedtime (Patient taking differently: Take 15 mg by mouth at bedtime. place 1 tablet ON TONGUE at bedtime) 90 tablet 0  . Multiple Vitamins-Minerals (MULTIVITAMIN WITH MINERALS) tablet Take 1 tablet by mouth daily.    Marland Kitchen omega-3 acid ethyl esters (LOVAZA) 1 g capsule take 2 capsules by mouth twice a day 360 capsule 1  . oxyCODONE-acetaminophen (PERCOCET/ROXICET) 5-325 MG tablet Take 1 tablet by mouth every 8 (eight) hours as needed for severe pain. 90 tablet 0  . potassium chloride SA (K-DUR,KLOR-CON) 20 MEQ tablet TAKE 3 TABLET BY MOUTH EVERY  DAY (Patient taking differently: Take 20 mEq by mouth 3 (three) times daily. ) 270 tablet 3  . prochlorperazine (COMPAZINE) 10 MG tablet Take 1 tablet (10 mg total) by mouth every 6 (six) hours as needed for nausea or vomiting. 30 tablet 5  . tamsulosin (FLOMAX) 0.4 MG CAPS capsule Take 0.4 mg by mouth daily.     Marland Kitchen torsemide (DEMADEX) 20 MG tablet take 4 tablets by mouth twice a day (Patient taking differently: take 3tablets by mouth twice a day) 720 tablet 0  . traZODone (DESYREL) 100 MG tablet take 1 tablet by mouth twice a day 180 tablet 1  . metolazone (ZAROXOLYN) 2.5 MG tablet Take 1 tablet (2.5 mg total) by mouth once a week as needed for extra edema/weight gain. (Patient not taking: Reported on 05/27/2016) 30 tablet 3  . nitroGLYCERIN (NITROSTAT) 0.4 MG SL tablet Place 1 tablet (0.4 mg total) under the tongue every 5 (five) minutes as needed for chest pain. (Patient not taking: Reported on 05/27/2016) 25 tablet 3   No current facility-administered medications for this encounter.      ALLERGIES: Patient has no known allergies.   LABORATORY DATA:  Lab Results  Component Value Date   WBC 5.8 05/26/2016   HGB 10.1 (L) 05/26/2016   HCT 31.0 (L) 05/26/2016   MCV  84.9 05/26/2016   PLT 91 (L) 05/26/2016   Lab Results  Component Value Date   NA 143 05/26/2016   K 4.2 05/26/2016   CL 105 04/28/2016   CO2 28 05/26/2016   Lab Results  Component Value Date   ALT 14 05/26/2016   AST 17 05/26/2016   ALKPHOS 88 05/26/2016   BILITOT 1.27 (H) 05/26/2016     NARRATIVE: Patrick [name] was seen today for weekly treatment management. The chart was checked and the patient's films were reviewed.  The patient has completed 11/28 fractions. The patient reports pain in his right hip, 3/10 in severity. He reports no bladder concerns. The patient reports his last bowel movement was 2 days ago, and he is feeling bloated. He reports typically eating 2 meals a day, though he has not eaten yet today. He  drinks 23 oz of water daily, and other fluids including tea, apple juice, and Ensure. He has gained 8 lbs since last week.  PHYSICAL EXAMINATION: weight is 206 lb 6.4 oz (93.6 kg). His oral temperature is 97.7 F (36.5 C). His blood pressure is 121/59 (abnormal) and his pulse is 51 (abnormal). His respiration is 16.  Alert and in no acute distress.   ASSESSMENT: The patient is doing satisfactorily with treatment.  PLAN: We will continue with the patient's radiation treatment as planned. I encouraged the patient to try Miralax to relieve his constipation.    ------------------------------------------------  Jodelle Gross, MD, PhD  This document serves as a record of services personally performed by Kyung Rudd, MD. It was created on his behalf by Maryla Morrow, a trained medical scribe. The creation of this record is based on the scribe's personal observations and the provider's statements to them. This document has been checked and approved by the attending provider.

## 2016-05-29 ENCOUNTER — Encounter (HOSPITAL_COMMUNITY): Payer: Self-pay | Admitting: Nurse Practitioner

## 2016-05-29 ENCOUNTER — Emergency Department (HOSPITAL_COMMUNITY): Payer: Medicare Other

## 2016-05-29 ENCOUNTER — Observation Stay (HOSPITAL_COMMUNITY)
Admission: EM | Admit: 2016-05-29 | Discharge: 2016-06-02 | Disposition: A | Discharge status: Home / Self Care | Payer: Medicare Other | Attending: Internal Medicine | Admitting: Internal Medicine

## 2016-05-29 DIAGNOSIS — T451X5A Adverse effect of antineoplastic and immunosuppressive drugs, initial encounter: Secondary | ICD-10-CM | POA: Diagnosis not present

## 2016-05-29 DIAGNOSIS — I110 Hypertensive heart disease with heart failure: Secondary | ICD-10-CM | POA: Insufficient documentation

## 2016-05-29 DIAGNOSIS — D6959 Other secondary thrombocytopenia: Secondary | ICD-10-CM | POA: Diagnosis not present

## 2016-05-29 DIAGNOSIS — G4700 Insomnia, unspecified: Secondary | ICD-10-CM | POA: Diagnosis not present

## 2016-05-29 DIAGNOSIS — Z87891 Personal history of nicotine dependence: Secondary | ICD-10-CM | POA: Diagnosis not present

## 2016-05-29 DIAGNOSIS — E785 Hyperlipidemia, unspecified: Secondary | ICD-10-CM | POA: Insufficient documentation

## 2016-05-29 DIAGNOSIS — I5022 Chronic systolic (congestive) heart failure: Secondary | ICD-10-CM

## 2016-05-29 DIAGNOSIS — C20 Malignant neoplasm of rectum: Secondary | ICD-10-CM | POA: Diagnosis present

## 2016-05-29 DIAGNOSIS — K625 Hemorrhage of anus and rectum: Principal | ICD-10-CM | POA: Diagnosis present

## 2016-05-29 DIAGNOSIS — M1990 Unspecified osteoarthritis, unspecified site: Secondary | ICD-10-CM | POA: Insufficient documentation

## 2016-05-29 DIAGNOSIS — N400 Enlarged prostate without lower urinary tract symptoms: Secondary | ICD-10-CM | POA: Insufficient documentation

## 2016-05-29 DIAGNOSIS — F329 Major depressive disorder, single episode, unspecified: Secondary | ICD-10-CM | POA: Insufficient documentation

## 2016-05-29 DIAGNOSIS — I10 Essential (primary) hypertension: Secondary | ICD-10-CM | POA: Diagnosis not present

## 2016-05-29 DIAGNOSIS — I2510 Atherosclerotic heart disease of native coronary artery without angina pectoris: Secondary | ICD-10-CM | POA: Diagnosis not present

## 2016-05-29 DIAGNOSIS — Z79899 Other long term (current) drug therapy: Secondary | ICD-10-CM | POA: Diagnosis not present

## 2016-05-29 DIAGNOSIS — D630 Anemia in neoplastic disease: Secondary | ICD-10-CM | POA: Diagnosis not present

## 2016-05-29 DIAGNOSIS — K5900 Constipation, unspecified: Secondary | ICD-10-CM | POA: Insufficient documentation

## 2016-05-29 DIAGNOSIS — I509 Heart failure, unspecified: Secondary | ICD-10-CM | POA: Diagnosis not present

## 2016-05-29 DIAGNOSIS — I255 Ischemic cardiomyopathy: Secondary | ICD-10-CM | POA: Diagnosis present

## 2016-05-29 DIAGNOSIS — D62 Acute posthemorrhagic anemia: Secondary | ICD-10-CM | POA: Diagnosis present

## 2016-05-29 DIAGNOSIS — D696 Thrombocytopenia, unspecified: Secondary | ICD-10-CM | POA: Diagnosis present

## 2016-05-29 LAB — CBC WITH DIFFERENTIAL/PLATELET
BASOS ABS: 0 10*3/uL (ref 0.0–0.1)
BASOS PCT: 0 %
EOS ABS: 0.1 10*3/uL (ref 0.0–0.7)
EOS PCT: 1 %
HCT: 28.7 % — ABNORMAL LOW (ref 39.0–52.0)
Hemoglobin: 9.6 g/dL — ABNORMAL LOW (ref 13.0–17.0)
Lymphocytes Relative: 12 %
Lymphs Abs: 0.7 10*3/uL (ref 0.7–4.0)
MCH: 28.2 pg (ref 26.0–34.0)
MCHC: 33.4 g/dL (ref 30.0–36.0)
MCV: 84.4 fL (ref 78.0–100.0)
MONO ABS: 0.6 10*3/uL (ref 0.1–1.0)
MONOS PCT: 10 %
Neutro Abs: 4.1 10*3/uL (ref 1.7–7.7)
Neutrophils Relative %: 76 %
PLATELETS: 87 10*3/uL — AB (ref 150–400)
RBC: 3.4 MIL/uL — ABNORMAL LOW (ref 4.22–5.81)
RDW: 18.3 % — AB (ref 11.5–15.5)
WBC: 5.4 10*3/uL (ref 4.0–10.5)

## 2016-05-29 LAB — COMPREHENSIVE METABOLIC PANEL
ALBUMIN: 4.2 g/dL (ref 3.5–5.0)
ALT: 13 U/L — ABNORMAL LOW (ref 17–63)
ANION GAP: 8 (ref 5–15)
AST: 16 U/L (ref 15–41)
Alkaline Phosphatase: 81 U/L (ref 38–126)
BILIRUBIN TOTAL: 1.5 mg/dL — AB (ref 0.3–1.2)
BUN: 16 mg/dL (ref 6–20)
CHLORIDE: 106 mmol/L (ref 101–111)
CO2: 27 mmol/L (ref 22–32)
Calcium: 8.6 mg/dL — ABNORMAL LOW (ref 8.9–10.3)
Creatinine, Ser: 1.5 mg/dL — ABNORMAL HIGH (ref 0.61–1.24)
GFR calc Af Amer: 54 mL/min — ABNORMAL LOW (ref >60)
GFR calc non Af Amer: 46 mL/min — ABNORMAL LOW (ref >60)
GLUCOSE: 91 mg/dL (ref 65–99)
POTASSIUM: 4.1 mmol/L (ref 3.5–5.1)
SODIUM: 141 mmol/L (ref 135–145)
TOTAL PROTEIN: 6.6 g/dL (ref 6.5–8.1)

## 2016-05-29 LAB — PROTIME-INR
INR: 1.35
Prothrombin Time: 16.7 seconds — ABNORMAL HIGH (ref 11.4–15.2)

## 2016-05-29 MED ORDER — MIRTAZAPINE 15 MG PO TBDP
15.0000 mg | ORAL_TABLET | Freq: Every day | ORAL | Status: DC
  Administered 2016-05-30 – 2016-06-02 (×4): 15 mg via ORAL
  Filled 2016-05-29 (×5): qty 1

## 2016-05-29 MED ORDER — PRAVASTATIN SODIUM 20 MG PO TABS
20.0000 mg | ORAL_TABLET | Freq: Every day | ORAL | Status: DC
  Administered 2016-05-31 – 2016-06-01 (×2): 20 mg via ORAL
  Filled 2016-05-29 (×2): qty 1

## 2016-05-29 MED ORDER — CARVEDILOL 25 MG PO TABS
25.0000 mg | ORAL_TABLET | Freq: Two times a day (BID) | ORAL | Status: DC
  Administered 2016-05-30 – 2016-06-02 (×6): 25 mg via ORAL
  Filled 2016-05-29 (×6): qty 1

## 2016-05-29 MED ORDER — LOSARTAN POTASSIUM 50 MG PO TABS
100.0000 mg | ORAL_TABLET | Freq: Every day | ORAL | Status: DC
  Administered 2016-05-30: 100 mg via ORAL
  Filled 2016-05-29: qty 2

## 2016-05-29 MED ORDER — TRAZODONE HCL 50 MG PO TABS
100.0000 mg | ORAL_TABLET | Freq: Two times a day (BID) | ORAL | Status: DC
  Administered 2016-05-30 – 2016-06-02 (×8): 100 mg via ORAL
  Filled 2016-05-29 (×8): qty 2

## 2016-05-29 MED ORDER — FINASTERIDE 5 MG PO TABS
5.0000 mg | ORAL_TABLET | Freq: Every day | ORAL | Status: DC
  Administered 2016-05-30 – 2016-06-02 (×5): 5 mg via ORAL
  Filled 2016-05-29 (×6): qty 1

## 2016-05-29 MED ORDER — SODIUM CHLORIDE 0.9 % IV BOLUS (SEPSIS)
1000.0000 mL | Freq: Once | INTRAVENOUS | Status: AC
  Administered 2016-05-29: 1000 mL via INTRAVENOUS

## 2016-05-29 MED ORDER — TAMSULOSIN HCL 0.4 MG PO CAPS
0.4000 mg | ORAL_CAPSULE | Freq: Every day | ORAL | Status: DC
  Administered 2016-05-30 – 2016-06-02 (×4): 0.4 mg via ORAL
  Filled 2016-05-29 (×4): qty 1

## 2016-05-29 MED ORDER — ACETAMINOPHEN 500 MG PO TABS
1000.0000 mg | ORAL_TABLET | Freq: Four times a day (QID) | ORAL | Status: DC | PRN
  Administered 2016-05-30 – 2016-06-02 (×5): 1000 mg via ORAL
  Filled 2016-05-29 (×5): qty 2

## 2016-05-29 MED ORDER — SODIUM CHLORIDE 0.9 % IV SOLN
INTRAVENOUS | Status: DC
  Administered 2016-05-30 – 2016-06-01 (×4): via INTRAVENOUS

## 2016-05-29 MED ORDER — FERROUS SULFATE 325 (65 FE) MG PO TABS
325.0000 mg | ORAL_TABLET | Freq: Every day | ORAL | Status: DC
Start: 2016-05-30 — End: 2016-06-02
  Administered 2016-05-30 – 2016-06-02 (×4): 325 mg via ORAL
  Filled 2016-05-29 (×4): qty 1

## 2016-05-29 MED ORDER — CAPECITABINE 500 MG PO TABS: 1500.0000 mg | ORAL_TABLET | Freq: Two times a day (BID) | ORAL | Status: DC

## 2016-05-29 NOTE — ED Triage Notes (Signed)
Pt state he has not had a BM for the las 2 days which is atypical of him, adds that he has noticed more rectal bleeding albeit going through treatment for rectal cancer. Denies any pain.

## 2016-05-29 NOTE — ED Provider Notes (Signed)
Patrick [name] DEPT Provider Note   CSN: [id number] Arrival date & time: 05/29/16  2008     History   Chief Complaint Chief Complaint  Patient presents with  . Constipation    HPI Patrick [name] is a 65 y.o. male hx of rectal cancer on chemo and radiation (most recently 2 days ago), CAD, CHF here presenting with constipation, blood in the stool. Patient states that for the last 5 days or so, he has been constipated. Saw his oncologist 2 days ago was started on MiraLAX. For the last 2 days, patient states that he felt like he needs to use the bathroom but when he does, patient has some blood from his rectum. States that occasionally some clots come out too. Denies any abdominal pain or passing out or weakness. Only on baby ASA, no blood thinners.    The history is provided by the patient.    Past Medical History:  Diagnosis Date  . Arthritis    "right hand; right knee" (06/19/2014)  . CAD (coronary artery disease)    2v CAD with subtotal occlusion of small co-dominant RCA and borderline lesion in moderate-sized OM-1  . CHF (congestive heart failure) (HCC)    Preserved EF  . Degenerative joint disease of knee, right   . Enlarged prostate   . Hyperlipidemia   . Hypertension   . Pneumonia 05/2014  . Prediabetes 10/03/2014  . Scrotal edema 04/03/2015  . SMALL BOWEL OBSTRUCTION, HX OF 08/21/2007   Annotation: with narrowing in the ileocecal region Qualifier: Diagnosis of  By: Hassell Done FNP, Tori Milks      Patient Active Problem List   Diagnosis Date Noted  . Iron deficiency anemia due to chronic blood loss 04/15/2016  . Adenocarcinoma of rectum ([place]) 04/14/2016  . Chronic renal disease, stage III 02/05/2016  . Abnormal finding on cardiovascular stress test 02/05/2016  . Cardiomyopathy, ischemic 06/17/2015  . Chronic systolic heart failure ([place]) 06/17/2015  . Major depressive disorder, recurrent episode ([place]) 04/17/2015  . Hypokalemia 08/29/2014  . Other iron deficiency anemias   .  Health care maintenance 03/11/2014  . Obesity, unspecified 10/03/2013  . Dyslipidemia 08/13/2013  . CARDIAC MURMUR 01/26/2010  . MACULAR DEGENERATION 05/02/2008  . ERECTILE DYSFUNCTION 03/13/2007  . Essential hypertension 01/16/2007  . BPH (benign prostatic hyperplasia) 01/16/2007    Past Surgical History:  Procedure Laterality Date  . CARDIAC CATHETERIZATION N/A 02/16/2016   Procedure: Right/Left Heart Cath and Coronary Angiography;  Surgeon: Jolaine Artist, MD;  Location: [facility];  Service: Cardiovascular;  Laterality: N/A;  . COLONOSCOPY N/A 04/01/2016   Procedure: COLONOSCOPY;  Surgeon: Leighton Ruff, MD;  Location: [facility];  Service: Endoscopy;  Laterality: N/A;  . INGUINAL HERNIA REPAIR Left 1990's       Home Medications    Prior to Admission medications   Medication Sig Start Date End Date Taking? Authorizing Provider  acetaminophen (TYLENOL) 500 MG tablet Take 1,000 mg by mouth every 6 (six) hours as needed for mild pain.   Yes Historical Provider, MD  Ascorbic Acid (VITAMIN C) 1000 MG tablet Take 2,000 mg by mouth 2 (two) times daily.   Yes Historical Provider, MD  aspirin EC 81 MG EC tablet Take 1 tablet (81 mg total) by mouth daily. 06/03/14  Yes [place] N Rumley, DO  capecitabine (XELODA) 500 MG tablet Take #4 tab in morning and #3 tab in evening on days of radiation-Monday-Friday 05/09/16  Yes Truitt Merle, MD  carvedilol (COREG) 25 MG tablet Take  1 tablet (25 mg total) by mouth 2 (two) times daily. 12/08/15  Yes Minus Breeding, MD  ferrous sulfate 325 (65 FE) MG tablet take 1 tablet by mouth once daily WITH BREAKFAST 08/11/15  Yes Kinnie Feil, MD  finasteride (PROSCAR) 5 MG tablet take 1 tablet by mouth once daily 05/18/16  Yes Sela Hua, MD  losartan (COZAAR) 100 MG tablet Take 1 tablet (100 mg total) by mouth daily. 03/31/16  Yes Minus Breeding, MD  lovastatin (MEVACOR) 20 MG tablet take 1 tablet by mouth at bedtime 05/25/16  Yes Sela Hua, MD   mirtazapine (REMERON SOL-TAB) 15 MG disintegrating tablet place 1 tablet ON TONGUE at bedtime Patient taking differently: Take 15 mg by mouth at bedtime. place 1 tablet ON TONGUE at bedtime 01/25/16  Yes Sela Hua, MD  Multiple Vitamins-Minerals (MULTIVITAMIN WITH MINERALS) tablet Take 1 tablet by mouth daily.   Yes Historical Provider, MD  omega-3 acid ethyl esters (LOVAZA) 1 g capsule take 2 capsules by mouth twice a day 03/02/16  Yes Sela Hua, MD  potassium chloride SA (K-DUR,KLOR-CON) 20 MEQ tablet TAKE 3 TABLET BY MOUTH EVERY DAY Patient taking differently: Take 20 mEq by mouth 3 (three) times daily.  06/03/15  Yes Minus Breeding, MD  tamsulosin (FLOMAX) 0.4 MG CAPS capsule Take 0.4 mg by mouth daily.    Yes Historical Provider, MD  torsemide (DEMADEX) 20 MG tablet take 4 tablets by mouth twice a day Patient taking differently: take 3 tablets by mouth twice a day 03/23/16  Yes Sela Hua, MD  traZODone (DESYREL) 100 MG tablet take 1 tablet by mouth twice a day 05/19/16  Yes Sela Hua, MD  metolazone (ZAROXOLYN) 2.5 MG tablet Take 1 tablet (2.5 mg total) by mouth once a week as needed for extra edema/weight gain. Patient not taking: Reported on 05/29/2016 04/29/16   Evelene Croon Barrett, PA-C  nitroGLYCERIN (NITROSTAT) 0.4 MG SL tablet Place 1 tablet (0.4 mg total) under the tongue every 5 (five) minutes as needed for chest pain. 04/29/16 07/28/16  Evelene Croon Barrett, PA-C  oxyCODONE-acetaminophen (PERCOCET/ROXICET) 5-325 MG tablet Take 1 tablet by mouth every 8 (eight) hours as needed for severe pain. Patient not taking: Reported on 05/29/2016 05/12/15   Kinnie Feil, MD  prochlorperazine (COMPAZINE) 10 MG tablet Take 1 tablet (10 mg total) by mouth every 6 (six) hours as needed for nausea or vomiting. Patient not taking: Reported on 05/29/2016 05/20/16   [name], MD    Family History Family History  Problem Relation Age of Onset  . Hypertension Mother   . Diabetes  Mother   . Stroke Mother   . Cancer Father 70    Prostate  . Dementia Father   . COPD Sister   . Arthritis Sister   . Edema Sister     Social History Social History  Substance Use Topics  . Smoking status: Former Smoker    Years: 5.00    Types: Pipe, Landscape architect  . Smokeless tobacco: Never Used     Comment: 06/19/2014 "stopped smoking in ~ 2014; used to smoke a pipe or cigar a couple times/month"  . Alcohol use No     Allergies   Patient has no known allergies.   Review of Systems Review of Systems  Gastrointestinal: Positive for constipation.  All other systems reviewed and are negative.    Physical Exam Updated Vital Signs BP 108/57 (BP Location: Left Arm)   Pulse (!) 55  Temp 98.2 F (36.8 C) (Oral)   Resp 16   SpO2 99%   Physical Exam  Constitutional: He is oriented to person, place, and time.  Chronically ill   HENT:  Head: Normocephalic.  Mouth/Throat: Oropharynx is clear and moist.  Eyes: EOM are normal. Pupils are equal, round, and reactive to light.  Neck: Normal range of motion. Neck supple.  Cardiovascular: Normal rate, regular rhythm and normal heart sounds.   Pulmonary/Chest: Effort normal and breath sounds normal. No respiratory distress. He has no wheezes. He has no rales.  Abdominal: Soft. Bowel sounds are normal. He exhibits no distension. There is no tenderness.  Genitourinary:  Genitourinary Comments: Rectal- + rectal mass that is not tender, some blood on exam, no obvious melena   Musculoskeletal: Normal range of motion.  Neurological: He is alert and oriented to person, place, and time.  Skin: Skin is warm.  Psychiatric: He has a normal mood and affect.  Nursing note and vitals reviewed.    ED Treatments / Results  Labs (all labs ordered are listed, but only abnormal results are displayed) Labs Reviewed  CBC WITH DIFFERENTIAL/PLATELET - Abnormal; Notable for the following:       Result Value   RBC 3.40 (*)    Hemoglobin 9.6 (*)     HCT 28.7 (*)    RDW 18.3 (*)    Platelets 87 (*)    All other components within normal limits  COMPREHENSIVE METABOLIC PANEL - Abnormal; Notable for the following:    Creatinine, Ser 1.50 (*)    Calcium 8.6 (*)    ALT 13 (*)    Total Bilirubin 1.5 (*)    GFR calc non Af Amer 46 (*)    GFR calc Af Amer 54 (*)    All other components within normal limits  PROTIME-INR - Abnormal; Notable for the following:    Prothrombin Time 16.7 (*)    All other components within normal limits    EKG  EKG Interpretation None       Radiology Dg Abd Acute W/chest  Result Date: 05/29/2016 CLINICAL DATA:  No bowel movement in 5 days.  Rectal bleeding. EXAM: DG ABDOMEN ACUTE W/ 1V CHEST COMPARISON:  None. FINDINGS: There is no evidence of dilated bowel loops or free intraperitoneal air. Gallstones are noted in the gallbladder. The heart size is enlarged. The mediastinal contour is normal. Both lungs are clear. IMPRESSION: No bowel obstruction or free air. Gallstones identified. No acute cardiopulmonary disease. Electronically Signed   By: Abelardo Diesel M.D.   On: 05/29/2016 20:56    Procedures Procedures (including critical care time)  Medications Ordered in ED Medications  sodium chloride 0.9 % bolus 1,000 mL (0 mLs Intravenous Stopped 05/29/16 2222)     Initial Impression / Assessment and Plan / ED Course  I have reviewed the triage vital signs and the nursing notes.  Pertinent labs & imaging results that were available during my care of the patient were reviewed by me and considered in my medical decision making (see chart for details).  Clinical Course    ISAAC RADILLO is a 65 y.o. male here with rectal mass with constipation and some bleeding. Likely bleeding from the known rectal mass. Will get xray to r/o SBO and to assess for severity of constipation. Will check CBC.    10:41 PM Hg 9.6, was 10.1 several days ago. Had another bloody bowel movement in the ED. Has rectal cancer and  likely that is the  source of bleeding. I consulted Dr. Hassell Done from surgery to consult on patient. Will admit to medicine service to trend serial CBCs.   Final Clinical Impressions(s) / ED Diagnoses   Final diagnoses:  None    New Prescriptions New Prescriptions   No medications on file     Drenda Freeze, MD 05/29/16 2310

## 2016-05-29 NOTE — Progress Notes (Signed)
  Spoke with Dr. Jennette Kettle regarding this patient being transferred to [facility] for admission. Informed him of 2 hour policy for transfer. Will accept patient to our service if he arrives within 2 hours.   Lucila Maine, DO PGY-1, [facility] 05/29/2016 11:44 PM Text pages welcome [phone]

## 2016-05-29 NOTE — ED Notes (Signed)
Bed: WA01 Expected date:  Expected time:  Means of arrival:  Comments: EMS rectal cancer/constipated-bloody drainage

## 2016-05-29 NOTE — H&P (Signed)
History and Physical    Patrick [name] [id number] DOB: 1950/06/01 DOA: 05/29/2016   PCP: Evette Doffing, MD Chief Complaint:  Chief Complaint  Patient presents with  . Constipation    HPI: Patrick [name] is a 65 y.o. male with medical history significant of hx of rectal cancer on chemo and radiation (most recent radiation 2 days ago, chemo is daily PO), CAD, CHF here presenting with constipation, blood in the stool. Patient states that for the last 5 days or so, he has been constipated.  Saw his oncologist 2 days ago was started on MiraLAX.  For the last 2 days, patient states that he felt like he needs to use the bathroom but when he does, patient has some blood from his rectum. States that occasionally some clots come out too.  Denies any abdominal pain or passing out or weakness. Only on baby ASA, no blood thinners.  With regards to the cancer, it is my understanding that it is currently stage 3A, and the plan was for neoadjuvant chemo and radiation followed by surgical resection 2 months after completion of this for best chance at cure.  ED Course: Dr. Hassell Done called by EDP, HGB 9.6 down from 10.1 a couple of days ago.  Review of Systems: As per HPI otherwise 10 point review of systems negative.    Past Medical History:  Diagnosis Date  . Arthritis    "right hand; right knee" (06/19/2014)  . CAD (coronary artery disease)    2v CAD with subtotal occlusion of small co-dominant RCA and borderline lesion in moderate-sized OM-1  . CHF (congestive heart failure) (HCC)    Preserved EF  . Degenerative joint disease of knee, right   . Enlarged prostate   . Hyperlipidemia   . Hypertension   . Pneumonia 05/2014  . Prediabetes 10/03/2014  . Scrotal edema 04/03/2015  . SMALL BOWEL OBSTRUCTION, HX OF 08/21/2007   Annotation: with narrowing in the ileocecal region Qualifier: Diagnosis of  By: Hassell Done FNP, Tori Milks      Past Surgical History:  Procedure Laterality Date  . CARDIAC CATHETERIZATION N/A  02/16/2016   Procedure: Right/Left Heart Cath and Coronary Angiography;  Surgeon: Jolaine Artist, MD;  Location: [facility];  Service: Cardiovascular;  Laterality: N/A;  . COLONOSCOPY N/A 04/01/2016   Procedure: COLONOSCOPY;  Surgeon: Leighton Ruff, MD;  Location: [facility];  Service: Endoscopy;  Laterality: N/A;  . INGUINAL HERNIA REPAIR Left 1990's     reports that he has quit smoking. His smoking use included Pipe and Cigars. He quit after 5.00 years of use. He has never used smokeless tobacco. He reports that he does not drink alcohol or use drugs.  No Known Allergies  Family History  Problem Relation Age of Onset  . Hypertension Mother   . Diabetes Mother   . Stroke Mother   . Cancer Father 65    Prostate  . Dementia Father   . COPD Sister   . Arthritis Sister   . Edema Sister       Prior to Admission medications   Medication Sig Start Date End Date Taking? Authorizing Provider  acetaminophen (TYLENOL) 500 MG tablet Take 1,000 mg by mouth every 6 (six) hours as needed for mild pain.   Yes Historical Provider, MD  Ascorbic Acid (VITAMIN C) 1000 MG tablet Take 2,000 mg by mouth 2 (two) times daily.   Yes Historical Provider, MD  aspirin EC 81 MG EC tablet Take 1 tablet (81  mg total) by mouth daily. 06/03/14  Yes Crosby N Rumley, DO  capecitabine (XELODA) 500 MG tablet Take #4 tab in morning and #3 tab in evening on days of radiation-Monday-Friday 05/09/16  Yes Truitt Merle, MD  carvedilol (COREG) 25 MG tablet Take 1 tablet (25 mg total) by mouth 2 (two) times daily. 12/08/15  Yes Minus Breeding, MD  ferrous sulfate 325 (65 FE) MG tablet take 1 tablet by mouth once daily WITH BREAKFAST 08/11/15  Yes Kinnie Feil, MD  finasteride (PROSCAR) 5 MG tablet take 1 tablet by mouth once daily 05/18/16  Yes Sela Hua, MD  losartan (COZAAR) 100 MG tablet Take 1 tablet (100 mg total) by mouth daily. 03/31/16  Yes Minus Breeding, MD  lovastatin (MEVACOR) 20 MG tablet take 1  tablet by mouth at bedtime 05/25/16  Yes Sela Hua, MD  mirtazapine (REMERON SOL-TAB) 15 MG disintegrating tablet place 1 tablet ON TONGUE at bedtime Patient taking differently: Take 15 mg by mouth at bedtime. place 1 tablet ON TONGUE at bedtime 01/25/16  Yes Sela Hua, MD  Multiple Vitamins-Minerals (MULTIVITAMIN WITH MINERALS) tablet Take 1 tablet by mouth daily.   Yes Historical Provider, MD  omega-3 acid ethyl esters (LOVAZA) 1 g capsule take 2 capsules by mouth twice a day 03/02/16  Yes Sela Hua, MD  potassium chloride SA (K-DUR,KLOR-CON) 20 MEQ tablet TAKE 3 TABLET BY MOUTH EVERY DAY Patient taking differently: Take 20 mEq by mouth 3 (three) times daily.  06/03/15  Yes Minus Breeding, MD  tamsulosin (FLOMAX) 0.4 MG CAPS capsule Take 0.4 mg by mouth daily.    Yes Historical Provider, MD  torsemide (DEMADEX) 20 MG tablet take 4 tablets by mouth twice a day Patient taking differently: take 3 tablets by mouth twice a day 03/23/16  Yes Sela Hua, MD  traZODone (DESYREL) 100 MG tablet take 1 tablet by mouth twice a day 05/19/16  Yes Sela Hua, MD  nitroGLYCERIN (NITROSTAT) 0.4 MG SL tablet Place 1 tablet (0.4 mg total) under the tongue every 5 (five) minutes as needed for chest pain. 04/29/16 07/28/16  Evelene Croon Barrett, PA-C  oxyCODONE-acetaminophen (PERCOCET/ROXICET) 5-325 MG tablet Take 1 tablet by mouth every 8 (eight) hours as needed for severe pain. Patient not taking: Reported on 05/29/2016 05/12/15   Kinnie Feil, MD    Physical Exam: Vitals:   05/29/16 2026 05/29/16 2244  BP: 108/57 132/68  Pulse: (!) 55 70  Resp: 16 14  Temp: 98.2 F (36.8 C)   TempSrc: Oral   SpO2: 99% 98%      Constitutional: NAD, calm, comfortable Eyes: PERRL, lids and conjunctivae normal ENMT: Mucous membranes are moist. Posterior pharynx clear of any exudate or lesions.Normal dentition.  Neck: normal, supple, no masses, no thyromegaly Respiratory: clear to auscultation  bilaterally, no wheezing, no crackles. Normal respiratory effort. No accessory muscle use.  Cardiovascular: Regular rate and rhythm, no murmurs / rubs / gallops. No extremity edema. 2+ pedal pulses. No carotid bruits.  Abdomen: no tenderness, no masses palpated. No hepatosplenomegaly. Bowel sounds positive.  Musculoskeletal: no clubbing / cyanosis. No joint deformity upper and lower extremities. Good ROM, no contractures. Normal muscle tone.  Skin: no rashes, lesions, ulcers. No induration Neurologic: CN 2-12 grossly intact. Sensation intact, DTR normal. Strength 5/5 in all 4.  Psychiatric: Normal judgment and insight. Alert and oriented x 3. Normal mood.    Labs on Admission: I have personally reviewed following labs and imaging studies  CBC:  Recent Labs Lab 05/26/16 1358 05/29/16 2111  WBC 5.8 5.4  NEUTROABS 4.7 4.1  HGB 10.1* 9.6*  HCT 31.0* 28.7*  MCV 84.9 84.4  PLT 91* 87*   Basic Metabolic Panel:  Recent Labs Lab 05/26/16 1358 05/29/16 2111  NA 143 141  K 4.2 4.1  CL  --  106  CO2 28 27  GLUCOSE 146* 91  BUN 17.2 16  CREATININE 1.5* 1.50*  CALCIUM 8.9 8.6*   GFR: Estimated Creatinine Clearance: 54 mL/min (by C-G formula based on SCr of 1.5 mg/dL (H)). Liver Function Tests:  Recent Labs Lab 05/26/16 1358 05/29/16 2111  AST 17 16  ALT 14 13*  ALKPHOS 88 81  BILITOT 1.27* 1.5*  PROT 6.8 6.6  ALBUMIN 3.6 4.2   No results for input(s): LIPASE, AMYLASE in the last 168 hours. No results for input(s): AMMONIA in the last 168 hours. Coagulation Profile:  Recent Labs Lab 05/29/16 2111  INR 1.35   Cardiac Enzymes: No results for input(s): CKTOTAL, CKMB, CKMBINDEX, TROPONINI in the last 168 hours. BNP (last 3 results) No results for input(s): PROBNP in the last 8760 hours. HbA1C: No results for input(s): HGBA1C in the last 72 hours. CBG: No results for input(s): GLUCAP in the last 168 hours. Lipid Profile: No results for input(s): CHOL, HDL, LDLCALC,  TRIG, CHOLHDL, LDLDIRECT in the last 72 hours. Thyroid Function Tests: No results for input(s): TSH, T4TOTAL, FREET4, T3FREE, THYROIDAB in the last 72 hours. Anemia Panel: No results for input(s): VITAMINB12, FOLATE, FERRITIN, TIBC, IRON, RETICCTPCT in the last 72 hours. Urine analysis:    Component Value Date/Time   COLORURINE YELLOW 01/17/2016 [place] 01/17/2016 1615   LABSPEC 1.014 01/17/2016 1615   PHURINE 6.0 01/17/2016 1615   GLUCOSEU NEGATIVE 01/17/2016 1615   HGBUR NEGATIVE 01/17/2016 1615   HGBUR negative 12/26/2008 1541   BILIRUBINUR NEGATIVE 01/17/2016 1615   KETONESUR NEGATIVE 01/17/2016 1615   PROTEINUR NEGATIVE 01/17/2016 1615   UROBILINOGEN 1.0 04/03/2015 1725   NITRITE NEGATIVE 01/17/2016 1615   LEUKOCYTESUR NEGATIVE 01/17/2016 1615   Sepsis Labs: @LABRCNTIP (procalcitonin:4,lacticidven:4) )No results found for this or any previous visit (from the past 240 hour(s)).   Radiological Exams on Admission: Dg Abd Acute W/chest  Result Date: 05/29/2016 CLINICAL DATA:  No bowel movement in 5 days.  Rectal bleeding. EXAM: DG ABDOMEN ACUTE W/ 1V CHEST COMPARISON:  None. FINDINGS: There is no evidence of dilated bowel loops or free intraperitoneal air. Gallstones are noted in the gallbladder. The heart size is enlarged. The mediastinal contour is normal. Both lungs are clear. IMPRESSION: No bowel obstruction or free air. Gallstones identified. No acute cardiopulmonary disease. Electronically Signed   By: Abelardo Diesel M.D.   On: 05/29/2016 20:56    EKG: Independently reviewed.  Assessment/Plan Principal Problem:   BRBPR (bright red blood per rectum) Active Problems:   Essential hypertension   Adenocarcinoma of rectum (HCC)   Acute blood loss anemia   Thrombocytopenia (HCC)    1. BRBPR and acute blood loss anemia - Most likely due to bleeding from the known adenocarcinoma of the rectum 1. Tele monitor 2. Repeat CBC in AM 3. Per my discussion with  Dr. Hassell Done: 1. Clear liquid diet for now 2. No specific request for laxatives 2. Ischemic cardiomyopathy 1. Holding ASA - last cath was in Sept: but no stent put in at this time. 2. Hold diuretics to "hydrate" patient in acute blood loss 3. Continue other meds 3. Stage  3A rectal cancer - 1. Currently undergoing radiation and chemo with Xeloda 2. Will continue Xeloda for the moment while in patient 4. HTN - continue home meds   DVT prophylaxis: SCDs only Code Status: Full Family Communication: No family in room Consults called: Spoke with Dr. Hassell Done with general surgery Admission status: Admit to inpatient   Etta Quill DO [facility] Pager [phone] from 7PM-7AM  If 7AM-7PM, please contact the day physician for the patient [email or web address] Password [email or web address]  05/29/2016, 11:21 PM

## 2016-05-30 DIAGNOSIS — K625 Hemorrhage of anus and rectum: Secondary | ICD-10-CM | POA: Diagnosis not present

## 2016-05-30 DIAGNOSIS — M1990 Unspecified osteoarthritis, unspecified site: Secondary | ICD-10-CM | POA: Diagnosis not present

## 2016-05-30 DIAGNOSIS — T451X5A Adverse effect of antineoplastic and immunosuppressive drugs, initial encounter: Secondary | ICD-10-CM | POA: Diagnosis not present

## 2016-05-30 DIAGNOSIS — I5022 Chronic systolic (congestive) heart failure: Secondary | ICD-10-CM | POA: Diagnosis not present

## 2016-05-30 DIAGNOSIS — K5900 Constipation, unspecified: Secondary | ICD-10-CM | POA: Diagnosis not present

## 2016-05-30 DIAGNOSIS — D6959 Other secondary thrombocytopenia: Secondary | ICD-10-CM | POA: Diagnosis not present

## 2016-05-30 DIAGNOSIS — I509 Heart failure, unspecified: Secondary | ICD-10-CM | POA: Diagnosis not present

## 2016-05-30 DIAGNOSIS — I2510 Atherosclerotic heart disease of native coronary artery without angina pectoris: Secondary | ICD-10-CM | POA: Diagnosis not present

## 2016-05-30 DIAGNOSIS — Z87891 Personal history of nicotine dependence: Secondary | ICD-10-CM | POA: Diagnosis not present

## 2016-05-30 DIAGNOSIS — D62 Acute posthemorrhagic anemia: Secondary | ICD-10-CM | POA: Diagnosis not present

## 2016-05-30 DIAGNOSIS — I110 Hypertensive heart disease with heart failure: Secondary | ICD-10-CM | POA: Diagnosis not present

## 2016-05-30 DIAGNOSIS — G4700 Insomnia, unspecified: Secondary | ICD-10-CM | POA: Diagnosis not present

## 2016-05-30 DIAGNOSIS — N400 Enlarged prostate without lower urinary tract symptoms: Secondary | ICD-10-CM | POA: Diagnosis not present

## 2016-05-30 DIAGNOSIS — I255 Ischemic cardiomyopathy: Secondary | ICD-10-CM | POA: Diagnosis not present

## 2016-05-30 DIAGNOSIS — C20 Malignant neoplasm of rectum: Secondary | ICD-10-CM | POA: Diagnosis not present

## 2016-05-30 DIAGNOSIS — E785 Hyperlipidemia, unspecified: Secondary | ICD-10-CM | POA: Diagnosis not present

## 2016-05-30 DIAGNOSIS — I10 Essential (primary) hypertension: Secondary | ICD-10-CM | POA: Diagnosis not present

## 2016-05-30 DIAGNOSIS — D630 Anemia in neoplastic disease: Secondary | ICD-10-CM | POA: Diagnosis not present

## 2016-05-30 DIAGNOSIS — Z79899 Other long term (current) drug therapy: Secondary | ICD-10-CM | POA: Diagnosis not present

## 2016-05-30 LAB — BASIC METABOLIC PANEL
ANION GAP: 7 (ref 5–15)
BUN: 15 mg/dL (ref 6–20)
CALCIUM: 8.2 mg/dL — AB (ref 8.9–10.3)
CO2: 28 mmol/L (ref 22–32)
Chloride: 105 mmol/L (ref 101–111)
Creatinine, Ser: 1.15 mg/dL (ref 0.61–1.24)
Glucose, Bld: 84 mg/dL (ref 65–99)
Potassium: 3.8 mmol/L (ref 3.5–5.1)
Sodium: 140 mmol/L (ref 135–145)

## 2016-05-30 LAB — CBC
HEMATOCRIT: 25.9 % — AB (ref 39.0–52.0)
Hemoglobin: 8.7 g/dL — ABNORMAL LOW (ref 13.0–17.0)
MCH: 28.5 pg (ref 26.0–34.0)
MCHC: 33.6 g/dL (ref 30.0–36.0)
MCV: 84.9 fL (ref 78.0–100.0)
PLATELETS: 62 10*3/uL — AB (ref 150–400)
RBC: 3.05 MIL/uL — ABNORMAL LOW (ref 4.22–5.81)
RDW: 18.5 % — AB (ref 11.5–15.5)
WBC: 4.4 10*3/uL (ref 4.0–10.5)

## 2016-05-30 NOTE — Progress Notes (Signed)
[facility] PROGRESS NOTE  Patrick [name] [id number] DOB: [birth year] DOA: 05/29/2016 PCP: Evette Doffing, MD  Interim summary and HPI 66 y.o. male with medical history significant of hx of rectal cancer on chemo and radiation (most recent radiation 2 days ago, chemo is daily PO), CAD, CHF here presenting with constipation, blood in the stool. Patient states that for the last 5 days or so, he has been constipated.  Saw his oncologist 2 days ago was started on MiraLAX.  For the last 2 days, patient states that he felt like he needs to use the bathroom but when he does, patient has some blood from his rectum. States that occasionally some clots come out too.  Denies any abdominal pain or passing out or weakness. Only on baby ASA, no blood thinners.  With regards to the cancer, it is my understanding that it is currently stage 3A, and the plan was for neoadjuvant chemo and radiation followed by surgical resection 2 months after completion of this for best chance at cure.  Assessment/Plan: 1-BRBPR: due to bleeding from rectal area. 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 Also reports constipation for 5 days or so and attempted miralax at home w/o success prior to start bleeding. -will continue CLD -will follow surgery recommendations -no laxatives for now -follow Hgb trend and transfuse as needed   2-ischemic cardiomyopathy  -no CP or SOB -last echo EF 30-35% in Dec 2017 -will hold diuretics for now,  -follow closely volume status -holding ASA due to acute bleeding  -continue B-blocker to prevent rebound tachycardia   3-HTN -continue coreg -follow VS -hold the rest of antihypertensive regimen for now  4-HLD -continue statins   5-depression and insomnia -continue trazodone  6-BPH -continue proscar and flomax  7-DVT prophylaxis -will use SCD's  8-Thrombocytopenia and anemia: associated with chemotherapy and malignancy -will avoid  heparin products -follow Hgb and Platelets trend   Code Status: Full Family Communication: no family at bedside  Disposition Plan: remains inpatient, follow Hgb trend and follow general surgery rec's. High risk for deterioration and symptomatic anemia (given underlying thrombocytopenia).   Consultants:  General surgery   Procedures:  See below for x-ray reports   Antibiotics:  None   HPI/Subjective: Afebrile, no nausea, no vomiting, no BM's. Reports decrease in the amount of BRBPR  Objective: Vitals:   05/30/16 1014 05/30/16 1300  BP: 122/83 (!) 115/56  Pulse: 60 (!) 56  Resp: 18 20  Temp:      Intake/Output Summary (Last 24 hours) at 05/30/16 1729 Last data filed at 05/30/16 1717  Gross per 24 hour  Intake          4523.75 ml  Output                0 ml  Net          4523.75 ml   Filed Weights   05/30/16 0035  Weight: 92.2 kg (203 lb 3.2 oz)    Exam:   General:  Afebrile, in no distress. Reports very little blood in his stools, even still present. Still no BM's. No nausea, no vomiting, no CP and SOB.  Cardiovascular: RRR, no rubs, no gallops  Respiratory: CTA bilaterally  Abdomen: soft, NT, ND, positive BS  Musculoskeletal: no edema, no cyanosis   Data Reviewed: Basic Metabolic Panel:  Recent Labs Lab 05/26/16 1358 05/29/16 2111 05/30/16 0543  NA 143 141 140  K 4.2 4.1 3.8  CL  --  106 105  CO2 28 27 28   GLUCOSE 146* 91 84  BUN 17.2 16 15   CREATININE 1.5* 1.50* 1.15  CALCIUM 8.9 8.6* 8.2*   Liver Function Tests:  Recent Labs Lab 05/26/16 1358 05/29/16 2111  AST 17 16  ALT 14 13*  ALKPHOS 88 81  BILITOT 1.27* 1.5*  PROT 6.8 6.6  ALBUMIN 3.6 4.2   CBC:  Recent Labs Lab 05/26/16 1358 05/29/16 2111 05/30/16 0543  WBC 5.8 5.4 4.4  NEUTROABS 4.7 4.1  --   HGB 10.1* 9.6* 8.7*  HCT 31.0* 28.7* 25.9*  MCV 84.9 84.4 84.9  PLT 91* 87* 62*   No results found for this or any previous visit (from the past 240 hour(s)).    Studies: Dg Abd Acute W/chest  Result Date: 05/29/2016 CLINICAL DATA:  No bowel movement in 5 days.  Rectal bleeding. EXAM: DG ABDOMEN ACUTE W/ 1V CHEST COMPARISON:  None. FINDINGS: There is no evidence of dilated bowel loops or free intraperitoneal air. Gallstones are noted in the gallbladder. The heart size is enlarged. The mediastinal contour is normal. Both lungs are clear. IMPRESSION: No bowel obstruction or free air. Gallstones identified. No acute cardiopulmonary disease. Electronically Signed   By: Abelardo Diesel M.D.   On: 05/29/2016 20:56    Scheduled Meds: . carvedilol  25 mg Oral BID WC  . ferrous sulfate  325 mg Oral Q breakfast  . finasteride  5 mg Oral Daily  . losartan  100 mg Oral Daily  . mirtazapine  15 mg Oral QHS  . pravastatin  20 mg Oral q1800  . tamsulosin  0.4 mg Oral Daily  . traZODone  100 mg Oral BID   Continuous Infusions: . sodium chloride 75 mL/hr at 05/30/16 0600    Principal Problem:   BRBPR (bright red blood per rectum) Active Problems:   Essential hypertension   Cardiomyopathy, ischemic   Adenocarcinoma of rectum (HCC)   Acute blood loss anemia   Thrombocytopenia ([place])    Time spent: 25 minutes    Barton Dubois  [facility] Pager [phone]. If 7PM-7AM, please contact night-coverage at [email or web address], password [email or web address] 05/30/2016, 5:29 PM  LOS: 1 day

## 2016-05-30 NOTE — Progress Notes (Signed)
Patient had 16 beats of v-tach on monitor.  Patient asymptomatic and VSS; BP 122/83.  Dr. Dyann Kief rounding on floor and aware.  No new orders at this time.  Will continue to monitor patient.

## 2016-05-30 NOTE — Progress Notes (Signed)
Patient ID: Patrick [name], male   DOB: [birth year], 66 y.o.   MRN: [id number] [facility] Surgery Progress Note:   * No surgery found *  Subjective: Mental status is alert.  Last RT was Friday Objective: Vital signs in last 24 hours: Temp:  [97.6 F (36.4 C)-98.2 F (36.8 C)] 98 F (36.7 C) (01/01 0600) Pulse Rate:  [55-70] 62 (01/01 0600) Resp:  [14-18] 18 (01/01 0600) BP: (108-132)/(57-72) 123/58 (01/01 0600) SpO2:  [93 %-100 %] 93 % (01/01 0600) Weight:  [92.2 kg (203 lb 3.2 oz)] 92.2 kg (203 lb 3.2 oz) (01/01 0035)  Intake/Output from previous day: 12/31 0701 - 01/01 0700 In: 1403.8 [I.V.:403.8; IV Piggyback:1000] Out: -  Intake/Output this shift: No intake/output data recorded.  Physical Exam: Work of breathing is not labored.  No abdominal pain but still feeling constipated.  On clear liquids  Lab Results:  Results for orders placed or performed during the hospital encounter of 05/29/16 (from the past 48 hour(s))  CBC with Differential/Platelet     Status: Abnormal   Collection Time: 05/29/16  9:11 PM  Result Value Ref Range   WBC 5.4 4.0 - 10.5 K/uL   RBC 3.40 (L) 4.22 - 5.81 MIL/uL   Hemoglobin 9.6 (L) 13.0 - 17.0 g/dL   HCT 28.7 (L) 39.0 - 52.0 %   MCV 84.4 78.0 - 100.0 fL   MCH 28.2 26.0 - 34.0 pg   MCHC 33.4 30.0 - 36.0 g/dL   RDW 18.3 (H) 11.5 - 15.5 %   Platelets 87 (L) 150 - 400 K/uL    Comment: RESULT REPEATED AND VERIFIED SPECIMEN CHECKED FOR CLOTS PLATELET COUNT CONFIRMED BY SMEAR    Neutrophils Relative % 76 %   Neutro Abs 4.1 1.7 - 7.7 K/uL   Lymphocytes Relative 12 %   Lymphs Abs 0.7 0.7 - 4.0 K/uL   Monocytes Relative 10 %   Monocytes Absolute 0.6 0.1 - 1.0 K/uL   Eosinophils Relative 1 %   Eosinophils Absolute 0.1 0.0 - 0.7 K/uL   Basophils Relative 0 %   Basophils Absolute 0.0 0.0 - 0.1 K/uL  Comprehensive metabolic panel     Status: Abnormal   Collection Time: 05/29/16  9:11 PM  Result Value Ref Range   Sodium 141 135 - 145 mmol/L    Potassium 4.1 3.5 - 5.1 mmol/L   Chloride 106 101 - 111 mmol/L   CO2 27 22 - 32 mmol/L   Glucose, Bld 91 65 - 99 mg/dL   BUN 16 6 - 20 mg/dL   Creatinine, Ser 1.50 (H) 0.61 - 1.24 mg/dL   Calcium 8.6 (L) 8.9 - 10.3 mg/dL   Total Protein 6.6 6.5 - 8.1 g/dL   Albumin 4.2 3.5 - 5.0 g/dL   AST 16 15 - 41 U/L   ALT 13 (L) 17 - 63 U/L   Alkaline Phosphatase 81 38 - 126 U/L   Total Bilirubin 1.5 (H) 0.3 - 1.2 mg/dL   GFR calc non Af Amer 46 (L) >60 mL/min   GFR calc Af Amer 54 (L) >60 mL/min    Comment: (NOTE) The eGFR has been calculated using the CKD EPI equation. This calculation has not been validated in all clinical situations. eGFR's persistently <60 mL/min signify possible Chronic Kidney Disease.    Anion gap 8 5 - 15  Protime-INR     Status: Abnormal   Collection Time: 05/29/16  9:11 PM  Result Value Ref Range   Prothrombin Time 16.7 (  H) 11.4 - 15.2 seconds   INR 1.35   CBC     Status: Abnormal   Collection Time: 05/30/16  5:43 AM  Result Value Ref Range   WBC 4.4 4.0 - 10.5 K/uL   RBC 3.05 (L) 4.22 - 5.81 MIL/uL   Hemoglobin 8.7 (L) 13.0 - 17.0 g/dL   HCT 25.9 (L) 39.0 - 52.0 %   MCV 84.9 78.0 - 100.0 fL   MCH 28.5 26.0 - 34.0 pg   MCHC 33.6 30.0 - 36.0 g/dL   RDW 18.5 (H) 11.5 - 15.5 %   Platelets 62 (L) 150 - 400 K/uL    Comment: CONSISTENT WITH PREVIOUS RESULT  Basic metabolic panel     Status: Abnormal   Collection Time: 05/30/16  5:43 AM  Result Value Ref Range   Sodium 140 135 - 145 mmol/L   Potassium 3.8 3.5 - 5.1 mmol/L   Chloride 105 101 - 111 mmol/L   CO2 28 22 - 32 mmol/L   Glucose, Bld 84 65 - 99 mg/dL   BUN 15 6 - 20 mg/dL   Creatinine, Ser 1.15 0.61 - 1.24 mg/dL   Calcium 8.2 (L) 8.9 - 10.3 mg/dL   GFR calc non Af Amer >60 >60 mL/min   GFR calc Af Amer >60 >60 mL/min    Comment: (NOTE) The eGFR has been calculated using the CKD EPI equation. This calculation has not been validated in all clinical situations. eGFR's persistently <60 mL/min  signify possible Chronic Kidney Disease.    Anion gap 7 5 - 15    Radiology/Results: Dg Abd Acute W/chest  Result Date: 05/29/2016 CLINICAL DATA:  No bowel movement in 5 days.  Rectal bleeding. EXAM: DG ABDOMEN ACUTE W/ 1V CHEST COMPARISON:  None. FINDINGS: There is no evidence of dilated bowel loops or free intraperitoneal air. Gallstones are noted in the gallbladder. The heart size is enlarged. The mediastinal contour is normal. Both lungs are clear. IMPRESSION: No bowel obstruction or free air. Gallstones identified. No acute cardiopulmonary disease. Electronically Signed   By: Abelardo Diesel M.D.   On: 05/29/2016 20:56    Anti-infectives: Anti-infectives    None      Assessment/Plan: Problem List: Patient Active Problem List   Diagnosis Date Noted  . BRBPR (bright red blood per rectum) 05/29/2016  . Acute blood loss anemia 05/29/2016  . Thrombocytopenia ([place]) 05/29/2016  . Iron deficiency anemia due to chronic blood loss 04/15/2016  . Adenocarcinoma of rectum ([place]) 04/14/2016  . Chronic renal disease, stage III 02/05/2016  . Abnormal finding on cardiovascular stress test 02/05/2016  . Cardiomyopathy, ischemic 06/17/2015  . Chronic systolic heart failure ([place]) 06/17/2015  . Major depressive disorder, recurrent episode ([place]) 04/17/2015  . Hypokalemia 08/29/2014  . Other iron deficiency anemias   . Health care maintenance 03/11/2014  . Obesity, unspecified 10/03/2013  . Dyslipidemia 08/13/2013  . CARDIAC MURMUR 01/26/2010  . MACULAR DEGENERATION 05/02/2008  . ERECTILE DYSFUNCTION 03/13/2007  . Essential hypertension 01/16/2007  . BPH (benign prostatic hyperplasia) 01/16/2007    Would continue clear liquids.  Would not give cathartics at this point as bleeding has abated.  Dr. Marcello Moores will see tomorrow.   * No surgery found *    LOS: 1 day   Matt B. Hassell Done, MD, [facility] [phone] beeper [phone]  05/30/2016 9:19 AM

## 2016-05-31 ENCOUNTER — Ambulatory Visit
Admission: RE | Admit: 2016-05-31 | Discharge: 2016-05-31 | Disposition: A | Discharge status: Home / Self Care | Payer: Medicare Other | Source: Ambulatory Visit | Attending: Radiation Oncology | Admitting: Radiation Oncology

## 2016-05-31 ENCOUNTER — Encounter: Payer: Self-pay | Admitting: *Deleted

## 2016-05-31 DIAGNOSIS — K5900 Constipation, unspecified: Secondary | ICD-10-CM | POA: Diagnosis not present

## 2016-05-31 DIAGNOSIS — I509 Heart failure, unspecified: Secondary | ICD-10-CM | POA: Diagnosis not present

## 2016-05-31 DIAGNOSIS — D6959 Other secondary thrombocytopenia: Secondary | ICD-10-CM | POA: Diagnosis not present

## 2016-05-31 DIAGNOSIS — I110 Hypertensive heart disease with heart failure: Secondary | ICD-10-CM | POA: Diagnosis not present

## 2016-05-31 DIAGNOSIS — I5022 Chronic systolic (congestive) heart failure: Secondary | ICD-10-CM | POA: Diagnosis not present

## 2016-05-31 DIAGNOSIS — Z87891 Personal history of nicotine dependence: Secondary | ICD-10-CM | POA: Diagnosis not present

## 2016-05-31 DIAGNOSIS — N400 Enlarged prostate without lower urinary tract symptoms: Secondary | ICD-10-CM | POA: Diagnosis not present

## 2016-05-31 DIAGNOSIS — I255 Ischemic cardiomyopathy: Secondary | ICD-10-CM | POA: Diagnosis not present

## 2016-05-31 DIAGNOSIS — D62 Acute posthemorrhagic anemia: Secondary | ICD-10-CM | POA: Diagnosis not present

## 2016-05-31 DIAGNOSIS — K625 Hemorrhage of anus and rectum: Secondary | ICD-10-CM | POA: Diagnosis not present

## 2016-05-31 DIAGNOSIS — M1990 Unspecified osteoarthritis, unspecified site: Secondary | ICD-10-CM | POA: Diagnosis not present

## 2016-05-31 DIAGNOSIS — I2510 Atherosclerotic heart disease of native coronary artery without angina pectoris: Secondary | ICD-10-CM | POA: Diagnosis not present

## 2016-05-31 DIAGNOSIS — D630 Anemia in neoplastic disease: Secondary | ICD-10-CM | POA: Diagnosis not present

## 2016-05-31 DIAGNOSIS — E785 Hyperlipidemia, unspecified: Secondary | ICD-10-CM | POA: Diagnosis not present

## 2016-05-31 DIAGNOSIS — G4700 Insomnia, unspecified: Secondary | ICD-10-CM | POA: Diagnosis not present

## 2016-05-31 DIAGNOSIS — T451X5A Adverse effect of antineoplastic and immunosuppressive drugs, initial encounter: Secondary | ICD-10-CM | POA: Diagnosis not present

## 2016-05-31 DIAGNOSIS — C20 Malignant neoplasm of rectum: Secondary | ICD-10-CM | POA: Diagnosis not present

## 2016-05-31 DIAGNOSIS — Z79899 Other long term (current) drug therapy: Secondary | ICD-10-CM | POA: Diagnosis not present

## 2016-05-31 LAB — BASIC METABOLIC PANEL
ANION GAP: 6 (ref 5–15)
BUN: 11 mg/dL (ref 6–20)
CHLORIDE: 109 mmol/L (ref 101–111)
CO2: 25 mmol/L (ref 22–32)
Calcium: 8.2 mg/dL — ABNORMAL LOW (ref 8.9–10.3)
Creatinine, Ser: 0.97 mg/dL (ref 0.61–1.24)
Glucose, Bld: 93 mg/dL (ref 65–99)
POTASSIUM: 3.8 mmol/L (ref 3.5–5.1)
SODIUM: 140 mmol/L (ref 135–145)

## 2016-05-31 LAB — CBC
HEMATOCRIT: 25.2 % — AB (ref 39.0–52.0)
HEMOGLOBIN: 8.4 g/dL — AB (ref 13.0–17.0)
MCH: 27.5 pg (ref 26.0–34.0)
MCHC: 33.3 g/dL (ref 30.0–36.0)
MCV: 82.6 fL (ref 78.0–100.0)
Platelets: 67 10*3/uL — ABNORMAL LOW (ref 150–400)
RBC: 3.05 MIL/uL — ABNORMAL LOW (ref 4.22–5.81)
RDW: 18.5 % — AB (ref 11.5–15.5)
WBC: 4.1 10*3/uL (ref 4.0–10.5)

## 2016-05-31 LAB — MAGNESIUM: MAGNESIUM: 2.4 mg/dL (ref 1.7–2.4)

## 2016-05-31 MED ORDER — POLYETHYLENE GLYCOL 3350 17 G PO PACK
17.0000 g | PACK | Freq: Two times a day (BID) | ORAL | Status: DC
  Administered 2016-05-31 – 2016-06-02 (×4): 17 g via ORAL
  Filled 2016-05-31 (×4): qty 1

## 2016-05-31 NOTE — Progress Notes (Signed)
Patient ID: Patrick [name], male   DOB: 04-Aug-1950, 66 y.o.   MRN: [id number] [facility] Surgery Progress Note:   * No surgery found *  Subjective: No bleeding overnight.  Last RT was Friday.  Feels constipated Objective: Vital signs in last 24 hours: Temp:  [97.5 F (36.4 C)-97.9 F (36.6 C)] 97.5 F (36.4 C) (01/02 0458) Pulse Rate:  [56-67] 67 (01/02 0458) Resp:  [18-20] 18 (01/02 0458) BP: (115-140)/(56-70) 140/70 (01/02 0458) SpO2:  [95 %-100 %] 97 % (01/02 0458)  Intake/Output from previous day: 01/01 0701 - 01/02 0700 In: 4920 [P.O.:3120; I.V.:1800] Out: 2000 [Urine:2000] Intake/Output this shift: Total I/O In: 7200 [P.O.:7200] Out: 250 [Urine:250]  Physical Exam: NAD Abd soft   Lab Results:  Results for orders placed or performed during the hospital encounter of 05/29/16 (from the past 48 hour(s))  CBC with Differential/Platelet     Status: Abnormal   Collection Time: 05/29/16  9:11 PM  Result Value Ref Range   WBC 5.4 4.0 - 10.5 K/uL   RBC 3.40 (L) 4.22 - 5.81 MIL/uL   Hemoglobin 9.6 (L) 13.0 - 17.0 g/dL   HCT 28.7 (L) 39.0 - 52.0 %   MCV 84.4 78.0 - 100.0 fL   MCH 28.2 26.0 - 34.0 pg   MCHC 33.4 30.0 - 36.0 g/dL   RDW 18.3 (H) 11.5 - 15.5 %   Platelets 87 (L) 150 - 400 K/uL    Comment: RESULT REPEATED AND VERIFIED SPECIMEN CHECKED FOR CLOTS PLATELET COUNT CONFIRMED BY SMEAR    Neutrophils Relative % 76 %   Neutro Abs 4.1 1.7 - 7.7 K/uL   Lymphocytes Relative 12 %   Lymphs Abs 0.7 0.7 - 4.0 K/uL   Monocytes Relative 10 %   Monocytes Absolute 0.6 0.1 - 1.0 K/uL   Eosinophils Relative 1 %   Eosinophils Absolute 0.1 0.0 - 0.7 K/uL   Basophils Relative 0 %   Basophils Absolute 0.0 0.0 - 0.1 K/uL  Comprehensive metabolic panel     Status: Abnormal   Collection Time: 05/29/16  9:11 PM  Result Value Ref Range   Sodium 141 135 - 145 mmol/L   Potassium 4.1 3.5 - 5.1 mmol/L   Chloride 106 101 - 111 mmol/L   CO2 27 22 - 32 mmol/L   Glucose, Bld 91 65 - 99  mg/dL   BUN 16 6 - 20 mg/dL   Creatinine, Ser 1.50 (H) 0.61 - 1.24 mg/dL   Calcium 8.6 (L) 8.9 - 10.3 mg/dL   Total Protein 6.6 6.5 - 8.1 g/dL   Albumin 4.2 3.5 - 5.0 g/dL   AST 16 15 - 41 U/L   ALT 13 (L) 17 - 63 U/L   Alkaline Phosphatase 81 38 - 126 U/L   Total Bilirubin 1.5 (H) 0.3 - 1.2 mg/dL   GFR calc non Af Amer 46 (L) >60 mL/min   GFR calc Af Amer 54 (L) >60 mL/min    Comment: (NOTE) The eGFR has been calculated using the CKD EPI equation. This calculation has not been validated in all clinical situations. eGFR's persistently <60 mL/min signify possible Chronic Kidney Disease.    Anion gap 8 5 - 15  Protime-INR     Status: Abnormal   Collection Time: 05/29/16  9:11 PM  Result Value Ref Range   Prothrombin Time 16.7 (H) 11.4 - 15.2 seconds   INR 1.35   CBC     Status: Abnormal   Collection Time: 05/30/16  5:43  AM  Result Value Ref Range   WBC 4.4 4.0 - 10.5 K/uL   RBC 3.05 (L) 4.22 - 5.81 MIL/uL   Hemoglobin 8.7 (L) 13.0 - 17.0 g/dL   HCT 25.9 (L) 39.0 - 52.0 %   MCV 84.9 78.0 - 100.0 fL   MCH 28.5 26.0 - 34.0 pg   MCHC 33.6 30.0 - 36.0 g/dL   RDW 18.5 (H) 11.5 - 15.5 %   Platelets 62 (L) 150 - 400 K/uL    Comment: CONSISTENT WITH PREVIOUS RESULT  Basic metabolic panel     Status: Abnormal   Collection Time: 05/30/16  5:43 AM  Result Value Ref Range   Sodium 140 135 - 145 mmol/L   Potassium 3.8 3.5 - 5.1 mmol/L   Chloride 105 101 - 111 mmol/L   CO2 28 22 - 32 mmol/L   Glucose, Bld 84 65 - 99 mg/dL   BUN 15 6 - 20 mg/dL   Creatinine, Ser 1.15 0.61 - 1.24 mg/dL   Calcium 8.2 (L) 8.9 - 10.3 mg/dL   GFR calc non Af Amer >60 >60 mL/min   GFR calc Af Amer >60 >60 mL/min    Comment: (NOTE) The eGFR has been calculated using the CKD EPI equation. This calculation has not been validated in all clinical situations. eGFR's persistently <60 mL/min signify possible Chronic Kidney Disease.    Anion gap 7 5 - 15  Basic metabolic panel     Status: Abnormal    Collection Time: 05/31/16  4:43 AM  Result Value Ref Range   Sodium 140 135 - 145 mmol/L   Potassium 3.8 3.5 - 5.1 mmol/L   Chloride 109 101 - 111 mmol/L   CO2 25 22 - 32 mmol/L   Glucose, Bld 93 65 - 99 mg/dL   BUN 11 6 - 20 mg/dL   Creatinine, Ser 0.97 0.61 - 1.24 mg/dL   Calcium 8.2 (L) 8.9 - 10.3 mg/dL   GFR calc non Af Amer >60 >60 mL/min   GFR calc Af Amer >60 >60 mL/min    Comment: (NOTE) The eGFR has been calculated using the CKD EPI equation. This calculation has not been validated in all clinical situations. eGFR's persistently <60 mL/min signify possible Chronic Kidney Disease.    Anion gap 6 5 - 15  Magnesium     Status: None   Collection Time: 05/31/16  4:43 AM  Result Value Ref Range   Magnesium 2.4 1.7 - 2.4 mg/dL  CBC     Status: Abnormal   Collection Time: 05/31/16  4:43 AM  Result Value Ref Range   WBC 4.1 4.0 - 10.5 K/uL   RBC 3.05 (L) 4.22 - 5.81 MIL/uL   Hemoglobin 8.4 (L) 13.0 - 17.0 g/dL   HCT 25.2 (L) 39.0 - 52.0 %   MCV 82.6 78.0 - 100.0 fL   MCH 27.5 26.0 - 34.0 pg   MCHC 33.3 30.0 - 36.0 g/dL   RDW 18.5 (H) 11.5 - 15.5 %   Platelets 67 (L) 150 - 400 K/uL    Comment: CONSISTENT WITH PREVIOUS RESULT    Radiology/Results: Dg Abd Acute W/chest  Result Date: 05/29/2016 CLINICAL DATA:  No bowel movement in 5 days.  Rectal bleeding. EXAM: DG ABDOMEN ACUTE W/ 1V CHEST COMPARISON:  None. FINDINGS: There is no evidence of dilated bowel loops or free intraperitoneal air. Gallstones are noted in the gallbladder. The heart size is enlarged. The mediastinal contour is normal. Both lungs are clear. IMPRESSION: No  bowel obstruction or free air. Gallstones identified. No acute cardiopulmonary disease. Electronically Signed   By: Abelardo Diesel M.D.   On: 05/29/2016 20:56    Anti-infectives: Anti-infectives    None      Assessment/Plan: Problem List: Patient Active Problem List   Diagnosis Date Noted  . BRBPR (bright red blood per rectum) 05/29/2016  .  Acute blood loss anemia 05/29/2016  . Thrombocytopenia ([place]) 05/29/2016  . Iron deficiency anemia due to chronic blood loss 04/15/2016  . 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 Chronic renal disease, stage III 02/05/2016  . Abnormal finding on cardiovascular stress test 02/05/2016  . Cardiomyopathy, ischemic 06/17/2015  . Chronic systolic heart failure ([place]) 06/17/2015  . Major depressive disorder, recurrent episode ([place]) 04/17/2015  . Hypokalemia 08/29/2014  . Other iron deficiency anemias   . Health care maintenance 03/11/2014  . Obesity, unspecified 10/03/2013  . Dyslipidemia 08/13/2013  . CARDIAC MURMUR 01/26/2010  . MACULAR DEGENERATION 05/02/2008  . ERECTILE DYSFUNCTION 03/13/2007  . Essential hypertension 01/16/2007  . BPH (benign prostatic hyperplasia) 01/16/2007   Advance diet as tolerated.   Miralax BID for constipation.   Needs to get RT Surgical intervention will not decrease bleeding Will sign off.  Pt to f/u with me the week after finishing RT   LOS: 2 days  Rosario Adie, MD  [facility]   05/31/2016 11:20 AM

## 2016-05-31 NOTE — Consult Note (Signed)
   [facility] CM Inpatient Consult   05/31/2016  Patrick [name] 1951-04-15 [id number]   [name] screened for [place] Management program services. Went to bedside to discuss. He was active in the past and is agreeable to re-enrolling for [facility] Care Management follow up. He lives with his sister who is disabled. He recently began treatments for cancer. He reports he came to hospital due to bleeding. Written consent obtained. Provided [facility] Care Management packet and contact information. Explained that he will receive post hospital discharge follow up from [facility] staff. Confirmed best contact number as home number as [phone]. Confirmed Primary Care MD is with [facility]. Appreciative of visit. Will make inpatient RNCM aware that [place] Management will follow post hospital discharge.    Will request to be assigned to [facility] team.   Marthenia Rolling, MSN-Ed, RN,BSN [facility] Liaison [phone]

## 2016-05-31 NOTE — Progress Notes (Signed)
[facility] PROGRESS NOTE  Patrick [name] [id number] DOB: July 31, 1950 DOA: 05/29/2016 PCP: Evette Doffing, MD  Interim summary and HPI 66 y.o. male with medical history significant of hx of rectal cancer on chemo and radiation (most recent radiation 2 days ago, chemo is daily PO), CAD, CHF here presenting with constipation, blood in the stool. Patient states that for the last 5 days or so, he has been constipated.  Saw his oncologist 2 days ago was started on MiraLAX.  For the last 2 days, patient states that he felt like he needs to use the bathroom but when he does, patient has some blood from his rectum. States that occasionally some clots come out too.  Denies any abdominal pain or passing out or weakness. Only on baby ASA, no blood thinners.  With regards to the cancer, it is my understanding that it is currently stage 3A, and the plan was for neoadjuvant chemo and radiation followed by surgical resection 2 months after completion of this for best chance at cure.  Assessment/Plan: 1-BRBPR: due to bleeding from rectal area. 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 Also reports constipation for 5 days or so and attempted miralax at home w/o success prior to start bleeding. -will advance soft diet -will continue radiotherapy -per surgery will start miralax BID, no surgery for now -Will follow Hgb trend and transfuse as needed   2-ischemic cardiomyopathy  -No CP or SOB -Last echo EF 30-35% in Dec 2017 -Will continue holding diuretics for now,  -Follow closely volume status -Holding ASA due to acute bleeding  -Continue B-blocker to prevent rebound tachycardia   3-HTN -continue coreg -follow VS -hold the rest of antihypertensive regimen for now as BP is well controlled.  4-HLD -continue statins   5-depression and insomnia -continue trazodone -no SI and no hallucinations -mood stable  6-BPH -continue proscar and flomax  7-DVT  prophylaxis -will use SCD's  8-Thrombocytopenia and anemia/with acute blood loss component: associated with chemotherapy and malignancy -will avoid heparin products -follow Hgb and Platelets trend   Code Status: Full Family Communication: no family at bedside  Disposition Plan: remains inpatient, follow Hgb trend and follow general surgery rec's. High risk for deterioration and symptomatic anemia (given underlying thrombocytopenia).   Consultants:  General surgery   Procedures:  See below for x-ray reports   Antibiotics:  None   HPI/Subjective: Afebrile, no nausea, no vomiting, no BM's yet. Patient passing gas and endorses no further hematochezia.   Objective: Vitals:   05/30/16 2058 05/31/16 0458  BP: 127/61 140/70  Pulse: (!) 57 67  Resp: 20 18  Temp: 97.9 F (36.6 C) 97.5 F (36.4 C)    Intake/Output Summary (Last 24 hours) at 05/31/16 1346 Last data filed at 05/31/16 0832  Gross per 24 hour  Intake             9960 ml  Output             2250 ml  Net             7710 ml   Filed Weights   05/30/16 0035  Weight: 92.2 kg (203 lb 3.2 oz)    Exam:   General:  Afebrile, in no distress. Reports no further blood in his stools. Still no BM's, but passing gas. Patient denies nausea and vomiting.    Cardiovascular: RRR, no rubs, no gallops  Respiratory: CTA bilaterally  Abdomen: soft, NT, ND, positive BS  Musculoskeletal:  no edema, no cyanosis   Data Reviewed: Basic Metabolic Panel:  Recent Labs Lab 05/26/16 1358 05/29/16 2111 05/30/16 0543 05/31/16 0443  NA 143 141 140 140  K 4.2 4.1 3.8 3.8  CL  --  106 105 109  CO2 28 27 28 25   GLUCOSE 146* 91 84 93  BUN 17.2 16 15 11   CREATININE 1.5* 1.50* 1.15 0.97  CALCIUM 8.9 8.6* 8.2* 8.2*  MG  --   --   --  2.4   Liver Function Tests:  Recent Labs Lab 05/26/16 1358 05/29/16 2111  AST 17 16  ALT 14 13*  ALKPHOS 88 81  BILITOT 1.27* 1.5*  PROT 6.8 6.6  ALBUMIN 3.6 4.2   CBC:  Recent  Labs Lab 05/26/16 1358 05/29/16 2111 05/30/16 0543 05/31/16 0443  WBC 5.8 5.4 4.4 4.1  NEUTROABS 4.7 4.1  --   --   HGB 10.1* 9.6* 8.7* 8.4*  HCT 31.0* 28.7* 25.9* 25.2*  MCV 84.9 84.4 84.9 82.6  PLT 91* 87* 62* 67*    Studies: Dg Abd Acute W/chest  Result Date: 05/29/2016 CLINICAL DATA:  No bowel movement in 5 days.  Rectal bleeding. EXAM: DG ABDOMEN ACUTE W/ 1V CHEST COMPARISON:  None. FINDINGS: There is no evidence of dilated bowel loops or free intraperitoneal air. Gallstones are noted in the gallbladder. The heart size is enlarged. The mediastinal contour is normal. Both lungs are clear. IMPRESSION: No bowel obstruction or free air. Gallstones identified. No acute cardiopulmonary disease. Electronically Signed   By: Abelardo Diesel M.D.   On: 05/29/2016 20:56    Scheduled Meds: . carvedilol  25 mg Oral BID WC  . ferrous sulfate  325 mg Oral Q breakfast  . finasteride  5 mg Oral Daily  . mirtazapine  15 mg Oral QHS  . polyethylene glycol  17 g Oral BID  . pravastatin  20 mg Oral q1800  . tamsulosin  0.4 mg Oral Daily  . traZODone  100 mg Oral BID   Continuous Infusions: . sodium chloride Stopped (05/31/16 1338)    Principal Problem:   BRBPR (bright red blood per rectum) Active Problems:   Essential hypertension   Cardiomyopathy, ischemic   Adenocarcinoma of rectum (HCC)   Acute blood loss anemia   Thrombocytopenia (HCC)    Time spent: 25 minutes    Barton Dubois  [facility] Pager [phone]. If 7PM-7AM, please contact night-coverage at [email or web address], password [facility] 05/31/2016, 1:46 PM  LOS: 2 days

## 2016-05-31 NOTE — Progress Notes (Signed)
Called floor for room 1417,spoke with Denton Ar, RN, patien can come for his radiation treatment per Rn, thnaked RN and called Faith, RT therapist, can treat patient today 10:09 AM

## 2016-06-01 ENCOUNTER — Ambulatory Visit
Admission: RE | Admit: 2016-06-01 | Discharge: 2016-06-01 | Disposition: A | Discharge status: Home / Self Care | Payer: Medicare Other | Source: Ambulatory Visit | Attending: Radiation Oncology | Admitting: Radiation Oncology

## 2016-06-01 ENCOUNTER — Observation Stay (HOSPITAL_COMMUNITY): Payer: Medicare Other

## 2016-06-01 DIAGNOSIS — I255 Ischemic cardiomyopathy: Secondary | ICD-10-CM | POA: Diagnosis not present

## 2016-06-01 DIAGNOSIS — D62 Acute posthemorrhagic anemia: Secondary | ICD-10-CM | POA: Diagnosis not present

## 2016-06-01 DIAGNOSIS — K625 Hemorrhage of anus and rectum: Secondary | ICD-10-CM | POA: Diagnosis not present

## 2016-06-01 DIAGNOSIS — T451X5A Adverse effect of antineoplastic and immunosuppressive drugs, initial encounter: Secondary | ICD-10-CM | POA: Diagnosis not present

## 2016-06-01 DIAGNOSIS — D6959 Other secondary thrombocytopenia: Secondary | ICD-10-CM | POA: Diagnosis not present

## 2016-06-01 DIAGNOSIS — M1990 Unspecified osteoarthritis, unspecified site: Secondary | ICD-10-CM | POA: Diagnosis not present

## 2016-06-01 DIAGNOSIS — I110 Hypertensive heart disease with heart failure: Secondary | ICD-10-CM | POA: Diagnosis not present

## 2016-06-01 DIAGNOSIS — Z79899 Other long term (current) drug therapy: Secondary | ICD-10-CM | POA: Diagnosis not present

## 2016-06-01 DIAGNOSIS — Z87891 Personal history of nicotine dependence: Secondary | ICD-10-CM | POA: Diagnosis not present

## 2016-06-01 DIAGNOSIS — C20 Malignant neoplasm of rectum: Secondary | ICD-10-CM | POA: Diagnosis not present

## 2016-06-01 DIAGNOSIS — K5900 Constipation, unspecified: Secondary | ICD-10-CM | POA: Diagnosis not present

## 2016-06-01 DIAGNOSIS — E785 Hyperlipidemia, unspecified: Secondary | ICD-10-CM | POA: Diagnosis not present

## 2016-06-01 DIAGNOSIS — G4700 Insomnia, unspecified: Secondary | ICD-10-CM | POA: Diagnosis not present

## 2016-06-01 DIAGNOSIS — N400 Enlarged prostate without lower urinary tract symptoms: Secondary | ICD-10-CM | POA: Diagnosis not present

## 2016-06-01 DIAGNOSIS — I2510 Atherosclerotic heart disease of native coronary artery without angina pectoris: Secondary | ICD-10-CM | POA: Diagnosis not present

## 2016-06-01 DIAGNOSIS — I509 Heart failure, unspecified: Secondary | ICD-10-CM | POA: Diagnosis not present

## 2016-06-01 DIAGNOSIS — K8020 Calculus of gallbladder without cholecystitis without obstruction: Secondary | ICD-10-CM | POA: Diagnosis not present

## 2016-06-01 DIAGNOSIS — D630 Anemia in neoplastic disease: Secondary | ICD-10-CM | POA: Diagnosis not present

## 2016-06-01 LAB — BASIC METABOLIC PANEL
ANION GAP: 9 (ref 5–15)
BUN: 11 mg/dL (ref 6–20)
CHLORIDE: 111 mmol/L (ref 101–111)
CO2: 22 mmol/L (ref 22–32)
Calcium: 8.1 mg/dL — ABNORMAL LOW (ref 8.9–10.3)
Creatinine, Ser: 1.23 mg/dL (ref 0.61–1.24)
GFR calc Af Amer: >60 mL/min (ref >60)
GFR, EST NON AFRICAN AMERICAN: 59 mL/min — AB (ref >60)
GLUCOSE: 129 mg/dL — AB (ref 65–99)
POTASSIUM: 3.4 mmol/L — AB (ref 3.5–5.1)
Sodium: 142 mmol/L (ref 135–145)

## 2016-06-01 LAB — CBC
HEMATOCRIT: 24.5 % — AB (ref 39.0–52.0)
HEMOGLOBIN: 8.2 g/dL — AB (ref 13.0–17.0)
MCH: 28.9 pg (ref 26.0–34.0)
MCHC: 33.5 g/dL (ref 30.0–36.0)
MCV: 86.3 fL (ref 78.0–100.0)
Platelets: 60 10*3/uL — ABNORMAL LOW (ref 150–400)
RBC: 2.84 MIL/uL — AB (ref 4.22–5.81)
RDW: 19.7 % — ABNORMAL HIGH (ref 11.5–15.5)
WBC: 4.1 10*3/uL (ref 4.0–10.5)

## 2016-06-01 MED ORDER — POTASSIUM CHLORIDE CRYS ER 20 MEQ PO TBCR
40.0000 meq | EXTENDED_RELEASE_TABLET | Freq: Once | ORAL | Status: AC
  Administered 2016-06-01: 40 meq via ORAL
  Filled 2016-06-01: qty 2

## 2016-06-01 MED ORDER — MAGNESIUM CITRATE PO SOLN
1.0000 | Freq: Once | ORAL | Status: AC
  Administered 2016-06-01: 1 via ORAL
  Filled 2016-06-01: qty 296

## 2016-06-01 NOTE — Care Management CC44 (Signed)
Condition Code 44 Documentation Completed  Patient Details  Name: Patrick [name] MRN: [id number] Date of Birth: [birth year]   Condition Code 44 given:  Yes Patient signature on Condition Code 44 notice:  Yes Documentation of 2 MD's agreement:  Yes Code 44 added to claim:  Yes    Dessa Phi, RN 06/01/2016, 1:01 PM

## 2016-06-01 NOTE — Care Management Obs Status (Signed)
[place] NOTIFICATION   Patient Details  Name: Patrick [name] MRN: [id number] Date of Birth: 1951-03-25   Medicare Observation Status Notification Given:  Yes    MahabirJuliann Pulse, RN 06/01/2016, 1:01 PM

## 2016-06-01 NOTE — Discharge Summary (Addendum)
Physician Discharge Summary  Patrick [name] [id number] DOB: [birth year] DOA: 05/29/2016  PCP: Evette Doffing, MD  Admit date: 05/29/2016 Discharge date: 06/02/2016  Admitted From: Home Disposition:  Home  Recommendations for Outpatient Follow-up:  1. Follow up with PCP in 2-3 weeks  Discharge Condition:Stable CODE STATUS:Full Diet recommendation: Soft   Brief/Interim Summary: 66 y.o.malewith medical history significant of hx of rectal cancer on chemo and radiation (most recent radiation2 days ago, chemo is daily PO), CAD, CHF here presenting with constipation, blood in the stool. Patient states that for the last 5 days or so, he has been constipated. Saw his oncologist 2 days ago was started on MiraLAX. For the last 2 days, patient states that he felt like he needs to use the bathroom but when he does, patient has some blood from his rectum. States that occasionally some clots come out too. Denies any abdominal pain or passing out or weakness. Only on baby ASA, no blood thinners.  With regards to the cancer, it is my understanding that it is currently stage 3A, and the plan was for neoadjuvant chemo and radiation followed by surgical resection 2 months after completion of this for best chance at cure.  1-BRBPR: due to bleeding from rectal area. 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 Also reports constipation for 5 days or so and attempted miralax at home w/o success prior to start bleeding. -advanced to soft diet -continued radiotherapy -per surgery will startedmiralax BID, no surgery for now -repeat abd xray with moderate stool seen. Notable results with Mg citrate  2-ischemic cardiomyopathy  -No CP or SOB -Last echo EF 30-35% in Dec 2017 -helddiuretics -Held ASA due to acute bleeding  -ContinuedB-blocker to prevent rebound tachycardia   3-HTN -continuedcoreg -held the rest of antihypertensive regimen for now as BP noted to be  well controlled.  4-HLD -continuedstatins   5-depression and insomnia -continued trazodone -no SI and no hallucinations -mood remains stable  6-BPH -continued proscar and flomax  7-DVT prophylaxis -usedSCD's  8-Thrombocytopenia and anemia/with acute blood loss component: associated with chemotherapy and malignancy -avoided heparin products   Discharge Diagnoses:  Principal Problem:   BRBPR (bright red blood per rectum) Active Problems:   Essential hypertension   Cardiomyopathy, ischemic   Adenocarcinoma of rectum (HCC)   Acute blood loss anemia   Thrombocytopenia (HCC)   Rectal bleeding    Discharge Instructions  Discharge Instructions    AMB Referral to [facility] [facility]    Complete by:  As directed    Please assign to [place] for transition of care and [facility] LCSW for community resources. Written consent obtained. Currently at [facility]. Please call with questions. Marthenia Rolling, [place], RN,BSN-[facility] [phone]   Reason for consult:  Please assign to [place] LCSW   Expected date of contact:  1-3 days (reserved for hospital discharges)     Allergies as of 06/02/2016   No Known Allergies     Medication List    STOP taking these medications   losartan 100 MG tablet Commonly known as:  COZAAR     TAKE these medications   acetaminophen 500 MG tablet Commonly known as:  TYLENOL Take 1,000 mg by mouth every 6 (six) hours as needed for mild pain.   aspirin 81 MG EC tablet Take 1 tablet (81 mg total) by mouth daily.   capecitabine 500 MG tablet Commonly known as:  XELODA Take #4  tab in morning and #3 tab in evening on days of radiation-Monday-Friday   carvedilol 25 MG tablet Commonly known as:  COREG Take 1 tablet (25 mg total) by mouth 2 (two) times daily.   ferrous sulfate 325 (65 FE) MG tablet take 1 tablet by mouth once daily WITH BREAKFAST   finasteride 5 MG  tablet Commonly known as:  PROSCAR take 1 tablet by mouth once daily   lovastatin 20 MG tablet Commonly known as:  MEVACOR take 1 tablet by mouth at bedtime   mirtazapine 15 MG disintegrating tablet Commonly known as:  REMERON SOL-TAB place 1 tablet ON TONGUE at bedtime What changed:  how much to take  how to take this  when to take this  additional instructions   multivitamin with minerals tablet Take 1 tablet by mouth daily.   nitroGLYCERIN 0.4 MG SL tablet Commonly known as:  NITROSTAT Place 1 tablet (0.4 mg total) under the tongue every 5 (five) minutes as needed for chest pain.   omega-3 acid ethyl esters 1 g capsule Commonly known as:  LOVAZA take 2 capsules by mouth twice a day   oxyCODONE-acetaminophen 5-325 MG tablet Commonly known as:  PERCOCET/ROXICET Take 1 tablet by mouth every 8 (eight) hours as needed for severe pain.   potassium chloride SA 20 MEQ tablet Commonly known as:  K-DUR,KLOR-CON TAKE 3 TABLET BY MOUTH EVERY DAY What changed:  how much to take  how to take this  when to take this  additional instructions   tamsulosin 0.4 MG Caps capsule Commonly known as:  FLOMAX Take 0.4 mg by mouth daily.   torsemide 20 MG tablet Commonly known as:  DEMADEX take 4 tablets by mouth twice a day What changed:  See the new instructions.   traZODone 100 MG tablet Commonly known as:  DESYREL take 1 tablet by mouth twice a day   vitamin C 1000 MG tablet Take 2,000 mg by mouth 2 (two) times daily.      Follow-up Information    Evette Doffing, MD. Schedule an appointment as soon as possible for a visit in 2 week(s).   Specialty:  Family Medicine Contact information: [place] [address] [phone]          No Known Allergies  Consultations:    Procedures/Studies: Mr Pelvis W Wo Contrast  Result Date: 05/04/2016 CLINICAL DATA:  Newly diagnosed rectal adenocarcinoma. EXAM: MRI PELVIS WITHOUT AND WITH CONTRAST  TECHNIQUE: Multiplanar multisequence MR imaging of the pelvis was performed both before and after administration of intravenous contrast. CONTRAST:  28mL MULTIHANCE GADOBENATE DIMEGLUMINE 529 MG/ML IV SOLN COMPARISON:  None. FINDINGS: Location of Tumor in Rectum:  Low Tumor Description: Nodular soft tissue mass is seen along the anterior and right lateral walls of the rectum Length of Tumor: 4.6 cm Distance from inferior tumor margin to internal sphincter/anorectal junction: <1 cm Tumor Extension through Muscularis Propria: No (T2) Adjacent Organ Involvement: No Levator Ani Involvement:  No Perirectal Lymph Nodes >=66mm: A left posterior perirectal lymph node is seen measuring 9 mm on image 9/3. A right posterior perirectal lymph node is seen measuring 7 mm on image 6/3. (N1) Other: Mild to moderately enlarged prostate gland, with numerous BPH nodules and mass effect on bladder base. IMPRESSION: Low rectal adenocarcinoma, T2 N1 MX by imaging. Distance from inferior tumor margin to anorectal junction is <1 cm. Electronically Signed   By: Earle Gell M.D.   On: 05/04/2016 19:40   Dg Abd  Acute W/chest  Result Date: 05/29/2016 CLINICAL DATA:  No bowel movement in 5 days.  Rectal bleeding. EXAM: DG ABDOMEN ACUTE W/ 1V CHEST COMPARISON:  None. FINDINGS: There is no evidence of dilated bowel loops or free intraperitoneal air. Gallstones are noted in the gallbladder. The heart size is enlarged. The mediastinal contour is normal. Both lungs are clear. IMPRESSION: No bowel obstruction or free air. Gallstones identified. No acute cardiopulmonary disease. Electronically Signed   By: Abelardo Diesel M.D.   On: 05/29/2016 20:56   Dg Abd Portable 1v  Result Date: 06/01/2016 CLINICAL DATA:  Constipation.  History of rectal cancer . EXAM: PORTABLE ABDOMEN - 1 VIEW COMPARISON:  05/29/2016.  CT 04/04/2016. FINDINGS: Multiple large bladder stones again noted . Multiple pelvic calcifications system phleboliths. Moderate stool  volume. No bowel distention. Degenerative changes lumbar spine and both hips. IMPRESSION: 1. Multiple large gallstones again noted. No acute abnormality identified. 2.  Moderate stool volume, constipation cannot be excluded . Electronically Signed   By: Marcello Moores  Register   On: 06/01/2016 13:01    Subjective: Still complains of constipation  Discharge Exam: Vitals:   06/01/16 2155 06/02/16 0450  BP: (!) 155/79 134/64  Pulse: 66 69  Resp: 18 18  Temp: 97.3 F (36.3 C) 98.1 F (36.7 C)   Vitals:   06/01/16 0500 06/01/16 1437 06/01/16 2155 06/02/16 0450  BP: (!) 120/52 (!) 148/70 (!) 155/79 134/64  Pulse: 65 61 66 69  Resp: 18 18 18 18   Temp: 99 F (37.2 C) 98.2 F (36.8 C) 97.3 F (36.3 C) 98.1 F (36.7 C)  TempSrc: Oral Oral Oral Oral  SpO2: 96% 96%  94%  Weight:    98.9 kg (218 lb 0.6 oz)  Height:        General: Pt is alert, awake, not in acute distress Cardiovascular: RRR, S1/S2 +, no rubs, no gallops Respiratory: CTA bilaterally, no wheezing, no rhonchi Abdominal: Soft, NT, ND, bowel sounds + Extremities: no edema, no cyanosis   The results of significant diagnostics from this hospitalization (including imaging, microbiology, ancillary and laboratory) are listed below for reference.     Microbiology: No results found for this or any previous visit (from the past 240 hour(s)).   Labs: BNP (last 3 results) No results for input(s): BNP in the last 8760 hours. Basic Metabolic Panel:  Recent Labs Lab 05/29/16 2111 05/30/16 0543 05/31/16 0443 06/01/16 0559 06/02/16 0524  NA 141 140 140 142 144  K 4.1 3.8 3.8 3.4* 3.7  CL 106 105 109 111 111  CO2 27 28 25 22 24   GLUCOSE 91 84 93 129* 116*  BUN 16 15 11 11 11   CREATININE 1.50* 1.15 0.97 1.23 1.17  CALCIUM 8.6* 8.2* 8.2* 8.1* 8.2*  MG  --   --  2.4  --   --    Liver Function Tests:  Recent Labs Lab 05/26/16 1358 05/29/16 2111  AST 17 16  ALT 14 13*  ALKPHOS 88 81  BILITOT 1.27* 1.5*  PROT 6.8 6.6   ALBUMIN 3.6 4.2   No results for input(s): LIPASE, AMYLASE in the last 168 hours. No results for input(s): AMMONIA in the last 168 hours. CBC:  Recent Labs Lab 05/26/16 1358 05/29/16 2111 05/30/16 0543 05/31/16 0443 06/01/16 0559  WBC 5.8 5.4 4.4 4.1 4.1  NEUTROABS 4.7 4.1  --   --   --   HGB 10.1* 9.6* 8.7* 8.4* 8.2*  HCT 31.0* 28.7* 25.9* 25.2* 24.5*  MCV 84.9  84.4 84.9 82.6 86.3  PLT 91* 87* 62* 67* 60*   Cardiac Enzymes: No results for input(s): CKTOTAL, CKMB, CKMBINDEX, TROPONINI in the last 168 hours. BNP: Invalid input(s): POCBNP CBG: No results for input(s): GLUCAP in the last 168 hours. D-Dimer No results for input(s): DDIMER in the last 72 hours. Hgb A1c No results for input(s): HGBA1C in the last 72 hours. Lipid Profile No results for input(s): CHOL, HDL, LDLCALC, TRIG, CHOLHDL, LDLDIRECT in the last 72 hours. Thyroid function studies No results for input(s): TSH, T4TOTAL, T3FREE, THYROIDAB in the last 72 hours.  Invalid input(s): FREET3 Anemia work up No results for input(s): VITAMINB12, FOLATE, FERRITIN, TIBC, IRON, RETICCTPCT in the last 72 hours. Urinalysis    Component Value Date/Time   COLORURINE YELLOW 01/17/2016 [place] 01/17/2016 1615   LABSPEC 1.014 01/17/2016 1615   PHURINE 6.0 01/17/2016 1615   GLUCOSEU NEGATIVE 01/17/2016 1615   HGBUR NEGATIVE 01/17/2016 1615   HGBUR negative 12/26/2008 1541   BILIRUBINUR NEGATIVE 01/17/2016 1615   KETONESUR NEGATIVE 01/17/2016 1615   PROTEINUR NEGATIVE 01/17/2016 1615   UROBILINOGEN 1.0 04/03/2015 1725   NITRITE NEGATIVE 01/17/2016 1615   LEUKOCYTESUR NEGATIVE 01/17/2016 1615   Sepsis Labs Invalid input(s): PROCALCITONIN,  WBC,  LACTICIDVEN Microbiology No results found for this or any previous visit (from the past 240 hour(s)).   SIGNED:   Donne Hazel, MD  [facility] 06/02/2016, 12:46 PM  If 7PM-7AM, please contact night-coverage [email or web address] Password  [email or web address]

## 2016-06-02 ENCOUNTER — Ambulatory Visit: Payer: Medicare Other | Admitting: Hematology

## 2016-06-02 ENCOUNTER — Other Ambulatory Visit: Payer: Medicare Other

## 2016-06-02 ENCOUNTER — Ambulatory Visit
Admission: RE | Admit: 2016-06-02 | Discharge: 2016-06-02 | Disposition: A | Discharge status: Home / Self Care | Payer: Medicare Other | Source: Ambulatory Visit | Attending: Radiation Oncology | Admitting: Radiation Oncology

## 2016-06-02 DIAGNOSIS — D62 Acute posthemorrhagic anemia: Secondary | ICD-10-CM

## 2016-06-02 DIAGNOSIS — G4700 Insomnia, unspecified: Secondary | ICD-10-CM | POA: Diagnosis not present

## 2016-06-02 DIAGNOSIS — K5900 Constipation, unspecified: Secondary | ICD-10-CM | POA: Diagnosis not present

## 2016-06-02 DIAGNOSIS — Z87891 Personal history of nicotine dependence: Secondary | ICD-10-CM | POA: Diagnosis not present

## 2016-06-02 DIAGNOSIS — I509 Heart failure, unspecified: Secondary | ICD-10-CM

## 2016-06-02 DIAGNOSIS — D509 Iron deficiency anemia, unspecified: Secondary | ICD-10-CM

## 2016-06-02 DIAGNOSIS — K625 Hemorrhage of anus and rectum: Secondary | ICD-10-CM | POA: Diagnosis not present

## 2016-06-02 DIAGNOSIS — I5022 Chronic systolic (congestive) heart failure: Secondary | ICD-10-CM

## 2016-06-02 DIAGNOSIS — I255 Ischemic cardiomyopathy: Secondary | ICD-10-CM

## 2016-06-02 DIAGNOSIS — I2510 Atherosclerotic heart disease of native coronary artery without angina pectoris: Secondary | ICD-10-CM | POA: Diagnosis not present

## 2016-06-02 DIAGNOSIS — C20 Malignant neoplasm of rectum: Secondary | ICD-10-CM | POA: Diagnosis not present

## 2016-06-02 DIAGNOSIS — Z79899 Other long term (current) drug therapy: Secondary | ICD-10-CM | POA: Diagnosis not present

## 2016-06-02 DIAGNOSIS — N400 Enlarged prostate without lower urinary tract symptoms: Secondary | ICD-10-CM | POA: Diagnosis not present

## 2016-06-02 DIAGNOSIS — D630 Anemia in neoplastic disease: Secondary | ICD-10-CM | POA: Diagnosis not present

## 2016-06-02 DIAGNOSIS — E785 Hyperlipidemia, unspecified: Secondary | ICD-10-CM | POA: Diagnosis not present

## 2016-06-02 DIAGNOSIS — D6959 Other secondary thrombocytopenia: Secondary | ICD-10-CM | POA: Diagnosis not present

## 2016-06-02 DIAGNOSIS — I110 Hypertensive heart disease with heart failure: Secondary | ICD-10-CM | POA: Diagnosis not present

## 2016-06-02 DIAGNOSIS — M1990 Unspecified osteoarthritis, unspecified site: Secondary | ICD-10-CM | POA: Diagnosis not present

## 2016-06-02 DIAGNOSIS — I10 Essential (primary) hypertension: Secondary | ICD-10-CM

## 2016-06-02 DIAGNOSIS — T451X5A Adverse effect of antineoplastic and immunosuppressive drugs, initial encounter: Secondary | ICD-10-CM | POA: Diagnosis not present

## 2016-06-02 LAB — BASIC METABOLIC PANEL
Anion gap: 9 (ref 5–15)
BUN: 11 mg/dL (ref 6–20)
CALCIUM: 8.2 mg/dL — AB (ref 8.9–10.3)
CO2: 24 mmol/L (ref 22–32)
CREATININE: 1.17 mg/dL (ref 0.61–1.24)
Chloride: 111 mmol/L (ref 101–111)
GFR calc Af Amer: >60 mL/min (ref >60)
GLUCOSE: 116 mg/dL — AB (ref 65–99)
POTASSIUM: 3.7 mmol/L (ref 3.5–5.1)
Sodium: 144 mmol/L (ref 135–145)

## 2016-06-02 MED ORDER — POTASSIUM CHLORIDE CRYS ER 20 MEQ PO TBCR: 40.0000 meq | EXTENDED_RELEASE_TABLET | Freq: Once | ORAL | Status: DC

## 2016-06-02 NOTE — Care Management Note (Signed)
Case Management Note  Patient Details  Name: Patrick [name] MRN: [id number] Date of Birth: 1951/04/22  Subjective/Objective:  Transportation issues-CSW will manage for home. No further CM needs.                 Action/Plan: d/c home.   Expected Discharge Date:                  Expected Discharge Plan:  Home/Self Care  In-House Referral:     Discharge planning Services  CM Consult  Post Acute Care Choice:    Choice offered to:     DME Arranged:    DME Agency:     HH Arranged:    [place] Agency:     Status of Service:  Completed, signed off  If discussed at [employer] of Stay Meetings, dates discussed:    Additional Comments:  Dessa Phi, RN 06/02/2016, 12:57 PM

## 2016-06-02 NOTE — Progress Notes (Signed)
PROGRESS NOTE    PAVEL PEPITONE  [id number] DOB: 1951/03/09 DOA: 05/29/2016 PCP: Evette Doffing, MD     Assessment & Plan:   Principal Problem:   BRBPR (bright red blood per rectum) Active Problems:   Essential hypertension   Cardiomyopathy, ischemic   Adenocarcinoma of rectum (HCC)   Acute blood loss anemia   Thrombocytopenia (HCC)   Rectal bleeding   1-BRBPR: due to bleeding from rectal area. 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 Also reports constipation for 5 days or so and attempted miralax at home w/o success prior to start bleeding. -advanced to soft diet -continued radiotherapy -per surgery will started miralax BID, no surgery for now -repeat abd xray with moderate stool seen. Notable results with Mg citrate  2-ischemic cardiomyopathy  -No CP or SOB -Last echo EF 30-35% in Dec 2017 -helddiuretics -Held ASA due to acute bleeding  -Continued B-blocker to prevent rebound tachycardia   3-HTN -continued coreg -held the rest of antihypertensive regimen for now as BP noted to be well controlled.  4-HLD -continued statins   5-depression and insomnia -continued trazodone -no SI and no hallucinations -mood remains stable  6-BPH -continued proscar and flomax  7-DVT prophylaxis -used SCD's  8-Thrombocytopenia and anemia/with acute blood loss component: associated with chemotherapy and malignancy -avoided heparin products  DVT prophylaxis: SCD Code Status: Full Family Communication: Pt in room, family not at bedside Disposition Plan: D/c home today. Discharge was held on 1/3 given increased abd discomfort and loose stools    Antimicrobials: Anti-infectives    None       Subjective: Eager to go home  Objective: Vitals:   06/01/16 0500 06/01/16 1437 06/01/16 2155 06/02/16 0450  BP: (!) 120/52 (!) 148/70 (!) 155/79 134/64  Pulse: 65 61 66 69  Resp: 18 18 18 18   Temp: 99 F (37.2 C) 98.2 F (36.8  C) 97.3 F (36.3 C) 98.1 F (36.7 C)  TempSrc: Oral Oral Oral Oral  SpO2: 96% 96%  94%  Weight:    98.9 kg (218 lb 0.6 oz)  Height:        Intake/Output Summary (Last 24 hours) at 06/02/16 1117 Last data filed at 06/01/16 1800  Gross per 24 hour  Intake              900 ml  Output                0 ml  Net              900 ml   Filed Weights   05/30/16 0035 06/02/16 0450  Weight: 92.2 kg (203 lb 3.2 oz) 98.9 kg (218 lb 0.6 oz)    Examination:  General exam: Appears calm and comfortable  Respiratory system: Clear to auscultation. Respiratory effort normal. Cardiovascular system: S1 & S2 heard, RRR. Gastrointestinal system: Abdomen is nondistended, soft and nontender. No organomegaly or masses felt. Normal bowel sounds heard. Central nervous system: Alert and oriented. No focal neurological deficits. Extremities: Symmetric 5 x 5 power. Skin: No rashes, lesions Psychiatry: Judgement and insight appear normal. Mood & affect appropriate.   Data Reviewed: I have personally reviewed following labs and imaging studies  CBC:  Recent Labs Lab 05/26/16 1358 05/29/16 2111 05/30/16 0543 05/31/16 0443 06/01/16 0559  WBC 5.8 5.4 4.4 4.1 4.1  NEUTROABS 4.7 4.1  --   --   --   HGB 10.1* 9.6* 8.7* 8.4* 8.2*  HCT 31.0* 28.7* 25.9*  25.2* 24.5*  MCV 84.9 84.4 84.9 82.6 86.3  PLT 91* 87* 62* 67* 60*   Basic Metabolic Panel:  Recent Labs Lab 05/29/16 2111 05/30/16 0543 05/31/16 0443 06/01/16 0559 06/02/16 0524  NA 141 140 140 142 144  K 4.1 3.8 3.8 3.4* 3.7  CL 106 105 109 111 111  CO2 27 28 25 22 24   GLUCOSE 91 84 93 129* 116*  BUN 16 15 11 11 11   CREATININE 1.50* 1.15 0.97 1.23 1.17  CALCIUM 8.6* 8.2* 8.2* 8.1* 8.2*  MG  --   --  2.4  --   --    GFR: Estimated Creatinine Clearance: 71.1 mL/min (by C-G formula based on SCr of 1.17 mg/dL). Liver Function Tests:  Recent Labs Lab 05/26/16 1358 05/29/16 2111  AST 17 16  ALT 14 13*  ALKPHOS 88 81  BILITOT 1.27*  1.5*  PROT 6.8 6.6  ALBUMIN 3.6 4.2   No results for input(s): LIPASE, AMYLASE in the last 168 hours. No results for input(s): AMMONIA in the last 168 hours. Coagulation Profile:  Recent Labs Lab 05/29/16 2111  INR 1.35   Cardiac Enzymes: No results for input(s): CKTOTAL, CKMB, CKMBINDEX, TROPONINI in the last 168 hours. BNP (last 3 results) No results for input(s): PROBNP in the last 8760 hours. HbA1C: No results for input(s): HGBA1C in the last 72 hours. CBG: No results for input(s): GLUCAP in the last 168 hours. Lipid Profile: No results for input(s): CHOL, HDL, LDLCALC, TRIG, CHOLHDL, LDLDIRECT in the last 72 hours. Thyroid Function Tests: No results for input(s): TSH, T4TOTAL, FREET4, T3FREE, THYROIDAB in the last 72 hours. Anemia Panel: No results for input(s): VITAMINB12, FOLATE, FERRITIN, TIBC, IRON, RETICCTPCT in the last 72 hours. Sepsis Labs: No results for input(s): PROCALCITON, LATICACIDVEN in the last 168 hours.  No results found for this or any previous visit (from the past 240 hour(s)).   Radiology Studies: Dg Abd Portable 1v  Result Date: 06/01/2016 CLINICAL DATA:  Constipation.  History of rectal cancer . EXAM: PORTABLE ABDOMEN - 1 VIEW COMPARISON:  05/29/2016.  CT 04/04/2016. FINDINGS: Multiple large bladder stones again noted . Multiple pelvic calcifications system phleboliths. Moderate stool volume. No bowel distention. Degenerative changes lumbar spine and both hips. IMPRESSION: 1. Multiple large gallstones again noted. No acute abnormality identified. 2.  Moderate stool volume, constipation cannot be excluded . Electronically Signed   By: Marcello Moores  Register   On: 06/01/2016 13:01    Scheduled Meds: . carvedilol  25 mg Oral BID WC  . ferrous sulfate  325 mg Oral Q breakfast  . finasteride  5 mg Oral Daily  . mirtazapine  15 mg Oral QHS  . polyethylene glycol  17 g Oral BID  . pravastatin  20 mg Oral q1800  . tamsulosin  0.4 mg Oral Daily  . traZODone   100 mg Oral BID   Continuous Infusions: . sodium chloride 75 mL/hr at 06/01/16 1800     LOS: 2 days   Karriem Muench, Orpah Melter, MD [facility] Pager [phone]  If 7PM-7AM, please contact night-coverage [email or web address] Password [email or web address] 06/02/2016, 11:17 AM

## 2016-06-02 NOTE — Progress Notes (Signed)
CSW was contacted for aid to transportation for patient via taxi cab. CSW met with patient to confirm with travel plans. CSW completed taxi cab voucher - provided to RN, Lesly Rubenstein for patient to give to taxi driver. RN to call Lilian Kapur (ph#: [phone]) when ready. CSW signing off.   Raynaldo Opitz, [facility] Clinical Social Worker cell #: [phone]

## 2016-06-02 NOTE — Progress Notes (Signed)
Patrick [name]   DOB:01-03-1951   [id number]   [id number]  Oncology follow-up  Subjective: Patient is known to me, under my care for his rectal cancer. He was scheduled to see me my clinic today, and noticed he was admitted to hospital a few days ago due to rectal bleeding. He states his rectal bleeding has resolved, he only notices it to be blood on the toilet paper tissue, and no blood in the stool. He has been receiving radiation this week, Xeloda was stopped.   Objective:  Vitals:   06/01/16 2155 06/02/16 0450  BP: (!) 155/79 134/64  Pulse: 66 69  Resp: 18 18  Temp: 97.3 F (36.3 C) 98.1 F (36.7 C)    Body mass index is 32.2 kg/m.  Intake/Output Summary (Last 24 hours) at 06/02/16 1350 Last data filed at 06/01/16 1800  Gross per 24 hour  Intake              900 ml  Output                0 ml  Net              900 ml     Sclerae unicteric  Oropharynx clear  No peripheral adenopathy  Lungs clear -- no rales or rhonchi  Heart regular rate and rhythm  Abdomen benign  MSK no focal spinal tenderness, no peripheral edema  Neuro nonfocal   CBG (last 3)  No results for input(s): GLUCAP in the last 72 hours.   Labs:  CBC Latest Ref Rng & Units 06/01/2016 05/31/2016 05/30/2016  WBC 4.0 - 10.5 K/uL 4.1 4.1 4.4  Hemoglobin 13.0 - 17.0 g/dL 8.2(L) 8.4(L) 8.7(L)  Hematocrit 39.0 - 52.0 % 24.5(L) 25.2(L) 25.9(L)  Platelets 150 - 400 K/uL 60(L) 67(L) 62(L)   CMP Latest Ref Rng & Units 06/02/2016 06/01/2016 05/31/2016  Glucose 65 - 99 mg/dL 116(H) 129(H) 93  BUN 6 - 20 mg/dL 11 11 11   Creatinine 0.61 - 1.24 mg/dL 1.17 1.23 0.97  Sodium 135 - 145 mmol/L 144 142 140  Potassium 3.5 - 5.1 mmol/L 3.7 3.4(L) 3.8  Chloride 101 - 111 mmol/L 111 111 109  CO2 22 - 32 mmol/L 24 22 25   Calcium 8.9 - 10.3 mg/dL 8.2(L) 8.1(L) 8.2(L)  Total Protein 6.5 - 8.1 g/dL - - -  Total Bilirubin 0.3 - 1.2 mg/dL - - -  Alkaline Phos 38 - 126 U/L - - -  AST 15 - 41 U/L - - -  ALT 17 - 63 U/L - - -     Urine Studies No results for input(s): UHGB, CRYS in the last 72 hours.  Invalid input(s): UACOL, UAPR, USPG, UPH, UTP, UGL, [place], UBIL, UNIT, UROB, [place], UEPI, UWBC, Duwayne Heck [place], [place]  Basic Metabolic Panel:  Recent Labs Lab 05/29/16 2111 05/30/16 0543 05/31/16 0443 06/01/16 0559 06/02/16 0524  NA 141 140 140 142 144  K 4.1 3.8 3.8 3.4* 3.7  CL 106 105 109 111 111  CO2 27 28 25 22 24   GLUCOSE 91 84 93 129* 116*  BUN 16 15 11 11 11   CREATININE 1.50* 1.15 0.97 1.23 1.17  CALCIUM 8.6* 8.2* 8.2* 8.1* 8.2*  MG  --   --  2.4  --   --    GFR Estimated Creatinine Clearance: 71.1 mL/min (by C-G formula based on SCr of 1.17 mg/dL). Liver Function Tests:  Recent Labs Lab 05/26/16 1358 05/29/16 2111  AST 17  16  ALT 14 13*  ALKPHOS 88 81  BILITOT 1.27* 1.5*  PROT 6.8 6.6  ALBUMIN 3.6 4.2   No results for input(s): LIPASE, AMYLASE in the last 168 hours. No results for input(s): AMMONIA in the last 168 hours. Coagulation profile  Recent Labs Lab 05/29/16 2111  INR 1.35    CBC:  Recent Labs Lab 05/26/16 1358 05/29/16 2111 05/30/16 0543 05/31/16 0443 06/01/16 0559  WBC 5.8 5.4 4.4 4.1 4.1  NEUTROABS 4.7 4.1  --   --   --   HGB 10.1* 9.6* 8.7* 8.4* 8.2*  HCT 31.0* 28.7* 25.9* 25.2* 24.5*  MCV 84.9 84.4 84.9 82.6 86.3  PLT 91* 87* 62* 67* 60*   Cardiac Enzymes: No results for input(s): CKTOTAL, CKMB, CKMBINDEX, TROPONINI in the last 168 hours. BNP: Invalid input(s): POCBNP CBG: No results for input(s): GLUCAP in the last 168 hours. D-Dimer No results for input(s): DDIMER in the last 72 hours. Hgb A1c No results for input(s): HGBA1C in the last 72 hours. Lipid Profile No results for input(s): CHOL, HDL, LDLCALC, TRIG, CHOLHDL, LDLDIRECT in the last 72 hours. Thyroid function studies No results for input(s): TSH, T4TOTAL, T3FREE, THYROIDAB in the last 72 hours.  Invalid input(s): FREET3 Anemia work up No results for input(s): VITAMINB12,  FOLATE, FERRITIN, TIBC, IRON, RETICCTPCT in the last 72 hours. Microbiology No results found for this or any previous visit (from the past 240 hour(s)).    Studies:  Dg Abd Portable 1v  Result Date: 06/01/2016 CLINICAL DATA:  Constipation.  History of rectal cancer . EXAM: PORTABLE ABDOMEN - 1 VIEW COMPARISON:  05/29/2016.  CT 04/04/2016. FINDINGS: Multiple large bladder stones again noted . Multiple pelvic calcifications system phleboliths. Moderate stool volume. No bowel distention. Degenerative changes lumbar spine and both hips. IMPRESSION: 1. Multiple large gallstones again noted. No acute abnormality identified. 2.  Moderate stool volume, constipation cannot be excluded . Electronically Signed   By: Marcello Moores  Register   On: 06/01/2016 13:01    Assessment: 66 y.o. male   1. Rectal bleeding secondary to rectal cancer  2. Rectal cancer, stage IIIa, on chemotherapy (on hold) and radiation 3. Anemia secondary to rectal cancer bleeding and iron deficiency 4. Moderate thrombocytopenia, secondary to chemotherapy Xeloda 5. CHF, stable, EF 20-25% 6. HTN  Plan:  -will hold xeloda for now due to his moderate thrombocytopenia. -He will continue radiation per Dr. Lisbeth Renshaw -I'll see him in my office next week, to see if we can restart [name], MD 06/02/2016  1:50 PM

## 2016-06-03 ENCOUNTER — Ambulatory Visit
Admission: RE | Admit: 2016-06-03 | Discharge: 2016-06-03 | Disposition: A | Discharge status: Home / Self Care | Payer: Medicare Other | Source: Ambulatory Visit | Attending: Radiation Oncology | Admitting: Radiation Oncology

## 2016-06-03 ENCOUNTER — Other Ambulatory Visit: Payer: Self-pay | Admitting: *Deleted

## 2016-06-03 ENCOUNTER — Encounter: Payer: Self-pay | Admitting: Radiation Oncology

## 2016-06-03 ENCOUNTER — Telehealth: Payer: Self-pay | Admitting: Hematology

## 2016-06-03 ENCOUNTER — Other Ambulatory Visit: Payer: Self-pay | Admitting: Cardiology

## 2016-06-03 DIAGNOSIS — Z823 Family history of stroke: Secondary | ICD-10-CM | POA: Diagnosis not present

## 2016-06-03 DIAGNOSIS — Z825 Family history of asthma and other chronic lower respiratory diseases: Secondary | ICD-10-CM | POA: Diagnosis not present

## 2016-06-03 DIAGNOSIS — I509 Heart failure, unspecified: Secondary | ICD-10-CM | POA: Diagnosis not present

## 2016-06-03 DIAGNOSIS — C20 Malignant neoplasm of rectum: Secondary | ICD-10-CM | POA: Diagnosis not present

## 2016-06-03 DIAGNOSIS — Z833 Family history of diabetes mellitus: Secondary | ICD-10-CM | POA: Diagnosis not present

## 2016-06-03 DIAGNOSIS — K769 Liver disease, unspecified: Secondary | ICD-10-CM | POA: Diagnosis not present

## 2016-06-03 DIAGNOSIS — K5730 Diverticulosis of large intestine without perforation or abscess without bleeding: Secondary | ICD-10-CM | POA: Diagnosis not present

## 2016-06-03 DIAGNOSIS — Z79899 Other long term (current) drug therapy: Secondary | ICD-10-CM | POA: Diagnosis not present

## 2016-06-03 DIAGNOSIS — Z87891 Personal history of nicotine dependence: Secondary | ICD-10-CM | POA: Diagnosis not present

## 2016-06-03 DIAGNOSIS — Z510 Encounter for antineoplastic radiation therapy: Secondary | ICD-10-CM | POA: Diagnosis not present

## 2016-06-03 DIAGNOSIS — M1990 Unspecified osteoarthritis, unspecified site: Secondary | ICD-10-CM | POA: Diagnosis not present

## 2016-06-03 DIAGNOSIS — Z8249 Family history of ischemic heart disease and other diseases of the circulatory system: Secondary | ICD-10-CM | POA: Diagnosis not present

## 2016-06-03 DIAGNOSIS — I110 Hypertensive heart disease with heart failure: Secondary | ICD-10-CM | POA: Diagnosis not present

## 2016-06-03 DIAGNOSIS — R7303 Prediabetes: Secondary | ICD-10-CM | POA: Diagnosis not present

## 2016-06-03 DIAGNOSIS — Z809 Family history of malignant neoplasm, unspecified: Secondary | ICD-10-CM | POA: Diagnosis not present

## 2016-06-03 DIAGNOSIS — I2510 Atherosclerotic heart disease of native coronary artery without angina pectoris: Secondary | ICD-10-CM | POA: Diagnosis not present

## 2016-06-03 DIAGNOSIS — N400 Enlarged prostate without lower urinary tract symptoms: Secondary | ICD-10-CM | POA: Diagnosis not present

## 2016-06-03 DIAGNOSIS — Z7982 Long term (current) use of aspirin: Secondary | ICD-10-CM | POA: Diagnosis not present

## 2016-06-03 DIAGNOSIS — E785 Hyperlipidemia, unspecified: Secondary | ICD-10-CM | POA: Diagnosis not present

## 2016-06-03 NOTE — Patient Outreach (Addendum)
[facility]) Care Management  06/03/2016  Patrick [name] 1951-01-21 [id number]   Referral received from hospital liaison to initiate [facility] care management services with member once discharged.  He has been involved with [facility] in the past for heart failure management but now has a diagnosis of colorectal cancer.  He was admitted 12/31-1/4 for rectal bleeding.  He has been receiving daily radiation and oral chemotherapy for the past few weeks.  Per chart, other history include hypertension, dyslipidemia, chronic renal disease, and benign prostatic hyperplasia.    Call placed to member, identity verified.  He state that he has been progressing well since his discharge, denies any bleeding today.  He does have another radiation treatment today.  He report that he has been taken off the oral chemotherapy agent but will continue to have daily (Monday-Friday) radiation sessions.  He state that he has visit with oncologist on a weekly basis, next appointment 1/9.  He report that the plan is to complete treatment and then have surgery.  He denies the need for transportation, stating that he is able to drive himself to/from treatment.  He reports compliance with all medications, denies questions.  Patient was recently discharged from hospital and all medications have been reviewed.  Discussed with member the concern for his health as he is the primary caregiver for his disabled sister.  He report that he is also concerned and would like for her to be placed in a facility at least for short term care until she is able to provide more care for herself or until he is more stable.  He is made aware that she is concerned with him being able to afford the home and other bills on his income alone.  Discussed other options that would provide more support for both, such as assisted/senior living, he state that he is not interested at this time.  He is made aware that his sister is only willing to consider other  living arrangements if he will be able to maintain financially on his own.  He questions if his sister has paid days left for SNF left or if paid days have been renewed since this is a new year.  He is aware that LCSW has been referred, advised to direct those questions toward her.    Offered to complete home visit on Monday morning at 11 am to discuss plan of care, he declines stating that was too early.  He prefer appointments no earlier than 1 pm, however he also has radiation treatments every afternoon.  He is advised that this care manage does not have a 1 pm appointment open until Friday, 1/12.  He agrees to home visit at that time.    He denies any urgent concerns, provided with contact information.  Advised to contact with questions.  [name], [place], MSN [place] [phone]

## 2016-06-03 NOTE — Progress Notes (Signed)
Weekly rd txs  Rectum, 15/28 completed, Was in hospital this past Sunday rectla bleeding with vclots, d/c 06/01/2016, had a good bowel movement yesterday, today stated "ryunny again,diarrhea', not taking imodium yet, no blood in stool or tissue, not taking xeloda, appetite okay, mild fatigue, pain in right hip and thigh 3:11 PM BP 137/73 (BP Location: Left Arm, Patient Position: Sitting, Cuff Size: Normal)   Pulse (!) 55   Temp 97.8 F (36.6 C) (Oral)   Resp 20   Wt 217 lb (98.4 kg)   SpO2 100% Comment: room air  BMI 32.05 kg/m   Wt Readings from Last 3 Encounters:  06/03/16 217 lb (98.4 kg)  06/02/16 218 lb 0.6 oz (98.9 kg)  05/27/16 206 lb 6.4 oz (93.6 kg)

## 2016-06-03 NOTE — Patient Outreach (Addendum)
[facility]) Care Management  06/03/2016  VICTORIA EUCEDA 04-20-1951 [id number]  CSW received a new referral on patient from [facility] Liaison with [facility], indicating that patient would benefit from social work services and resources to assist with possible home care needs and services, as patient is primary caregiver for his sister, Zakye Baby.  Ms. Nevada Crane is aware that CSW and patient's RNCM, [name], also with [employer], are working with Ms. Butterfield on long-term care placement into an assisted living facility, hoping that patient may also be agreeable, at least while undergoing aggressive treatment for rectal cancer.  CSW will also assess patient for home care services and area providers. CSW was able to make initial contact with patient today to perform phone assessment, as well as assess and assist with social work needs and services.  CSW introduced self, explained role and types of services provided through [facility] ([facility]).  CSW further explained to patient that CSW works with Ms. Nevada Crane and Mrs. Lane.  CSW then explained the reason for the call, indicating that Ms. Nevada Crane and Mrs. Orene Desanctis thought that patient would benefit from social work services and resources to assist with referrals to various community agencies and resources.  CSW obtained two HIPAA compliant identifiers from patient, which included patient's name and date of birth. Patient indicated that he was discharged from [facility] on Thursday, January 4th, returning home to live with Ms. Chestnut.  Patient reports to feeling much better and ready to resume his responsibility of caring for Ms. Heinzman.  CSW explained to patient that CSW is already involved with Ms. Kumpf care, encouraging her to consider long-term care placement in an assisted living facility.  CSW then spoke with patient about the possibility  of he and Ms. Delcastillo being able to go into an assisted living facility together.  Patient denied having an interest in placement, reporting, "And, I seriously doubt you will ever get my sister to agree to placement either". Patient was extremely pleasant and appreciative of the call, but denied having any social work needs at present.  Patient went on to say, "I would love to be able to take advantage of your services, but I can't think of a thing I need".  Patient took down CSW's contact information, agreeing to contact CSW directly if social work needs arise in the near future.  CSW explained to patient that he will also be receiving a call from Mrs. Orene Desanctis to discuss disease management services.  Patient voiced understanding and was agreeable to this plan. CSW will perform a case closure on patient, as all goals of treatment have been met from social work standpoint and no additional social work needs have been identified at this time.  CSW will notify patient's RNCM with [facility], [name] of CSW's plans to close patient's case.  CSW will fax an update to patient's Primary Care Physician, Dr. Hyman Bible to ensure that they are aware of CSW's involvement with patient's plan of care.  CSW will submit a case closure request to Josepha Pigg, Care Management Assistant with [facility], in the form of an [employer].  CSW will ensure that Mrs. Quentin Cornwall is aware of Mrs. Lane's, RNCM with [facility], continued involvement with patient's care.

## 2016-06-03 NOTE — Telephone Encounter (Signed)
sw pt to confirm 1/9 appt date/time per LOS

## 2016-06-04 ENCOUNTER — Encounter (HOSPITAL_COMMUNITY): Payer: Self-pay | Admitting: Emergency Medicine

## 2016-06-04 ENCOUNTER — Emergency Department (HOSPITAL_COMMUNITY)
Admission: EM | Admit: 2016-06-04 | Discharge: 2016-06-04 | Disposition: A | Discharge status: Home / Self Care | Payer: Medicare Other | Attending: Emergency Medicine | Admitting: Emergency Medicine

## 2016-06-04 DIAGNOSIS — R339 Retention of urine, unspecified: Secondary | ICD-10-CM | POA: Diagnosis not present

## 2016-06-04 DIAGNOSIS — I5022 Chronic systolic (congestive) heart failure: Secondary | ICD-10-CM | POA: Insufficient documentation

## 2016-06-04 DIAGNOSIS — I130 Hypertensive heart and chronic kidney disease with heart failure and stage 1 through stage 4 chronic kidney disease, or unspecified chronic kidney disease: Secondary | ICD-10-CM | POA: Diagnosis not present

## 2016-06-04 DIAGNOSIS — Z7982 Long term (current) use of aspirin: Secondary | ICD-10-CM | POA: Diagnosis not present

## 2016-06-04 DIAGNOSIS — R195 Other fecal abnormalities: Secondary | ICD-10-CM | POA: Diagnosis present

## 2016-06-04 DIAGNOSIS — E876 Hypokalemia: Secondary | ICD-10-CM

## 2016-06-04 DIAGNOSIS — K625 Hemorrhage of anus and rectum: Secondary | ICD-10-CM | POA: Diagnosis not present

## 2016-06-04 DIAGNOSIS — Z87891 Personal history of nicotine dependence: Secondary | ICD-10-CM | POA: Diagnosis not present

## 2016-06-04 DIAGNOSIS — R197 Diarrhea, unspecified: Secondary | ICD-10-CM | POA: Diagnosis not present

## 2016-06-04 DIAGNOSIS — I2510 Atherosclerotic heart disease of native coronary artery without angina pectoris: Secondary | ICD-10-CM | POA: Insufficient documentation

## 2016-06-04 DIAGNOSIS — N183 Chronic kidney disease, stage 3 (moderate): Secondary | ICD-10-CM | POA: Insufficient documentation

## 2016-06-04 LAB — CBC
HCT: 28.3 % — ABNORMAL LOW (ref 39.0–52.0)
Hemoglobin: 9.2 g/dL — ABNORMAL LOW (ref 13.0–17.0)
MCH: 27.3 pg (ref 26.0–34.0)
MCHC: 32.5 g/dL (ref 30.0–36.0)
MCV: 84 fL (ref 78.0–100.0)
PLATELETS: 79 10*3/uL — AB (ref 150–400)
RBC: 3.37 MIL/uL — AB (ref 4.22–5.81)
RDW: 21.6 % — ABNORMAL HIGH (ref 11.5–15.5)
WBC: 5.6 10*3/uL (ref 4.0–10.5)

## 2016-06-04 LAB — COMPREHENSIVE METABOLIC PANEL
ALK PHOS: 81 U/L (ref 38–126)
ALT: 12 U/L — AB (ref 17–63)
AST: 19 U/L (ref 15–41)
Albumin: 3.6 g/dL (ref 3.5–5.0)
Anion gap: 9 (ref 5–15)
BILIRUBIN TOTAL: 1.5 mg/dL — AB (ref 0.3–1.2)
BUN: 12 mg/dL (ref 6–20)
CALCIUM: 8.3 mg/dL — AB (ref 8.9–10.3)
CO2: 25 mmol/L (ref 22–32)
CREATININE: 1.18 mg/dL (ref 0.61–1.24)
Chloride: 106 mmol/L (ref 101–111)
GFR calc non Af Amer: >60 mL/min (ref >60)
Glucose, Bld: 95 mg/dL (ref 65–99)
Potassium: 3.2 mmol/L — ABNORMAL LOW (ref 3.5–5.1)
SODIUM: 140 mmol/L (ref 135–145)
Total Protein: 6.9 g/dL (ref 6.5–8.1)

## 2016-06-04 LAB — URINALYSIS, ROUTINE W REFLEX MICROSCOPIC
BILIRUBIN URINE: NEGATIVE
GLUCOSE, UA: NEGATIVE mg/dL
Ketones, ur: NEGATIVE mg/dL
Nitrite: NEGATIVE
PH: 5 (ref 5.0–8.0)
Protein, ur: NEGATIVE mg/dL
SPECIFIC GRAVITY, URINE: 1.015 (ref 1.005–1.030)

## 2016-06-04 LAB — URINALYSIS, MICROSCOPIC (REFLEX)

## 2016-06-04 LAB — LIPASE, BLOOD: Lipase: 17 U/L (ref 11–51)

## 2016-06-04 LAB — POC OCCULT BLOOD, ED: FECAL OCCULT BLD: POSITIVE — AB

## 2016-06-04 MED ORDER — ONDANSETRON HCL 4 MG/2ML IJ SOLN: 4.0000 mg | Freq: Once | INTRAMUSCULAR | Status: DC

## 2016-06-04 MED ORDER — CEPHALEXIN 500 MG PO CAPS: 500.0000 mg | ORAL_CAPSULE | Freq: Two times a day (BID) | ORAL | 0 refills | Status: DC

## 2016-06-04 MED ORDER — POTASSIUM CHLORIDE CRYS ER 20 MEQ PO TBCR
40.0000 meq | EXTENDED_RELEASE_TABLET | Freq: Once | ORAL | Status: AC
  Administered 2016-06-04: 40 meq via ORAL
  Filled 2016-06-04: qty 2

## 2016-06-04 NOTE — Discharge Instructions (Signed)
You will go home with the foley catheter. You need to call [facility] on Monday. I am also giving you an antibiotic for a urinary tract infection. Take as prescribed. You may take imodium for diarrhea. You need to also make sure you are taking your potassium supplements and have your potassium rechecked with your primary care doctor next week. You need to stay hydrated and drink plenty of water. Return to the ED if you develop worsening abdominal pain, vomiting, fever, or worsening rectal bleeding. Follow up with your oncologist.

## 2016-06-04 NOTE — ED Provider Notes (Signed)
[facility] DEPT Provider Note   CSN: [id number] Arrival date & time: 06/04/16  1046     History   Chief Complaint Chief Complaint  Patient presents with  . Urinary Retention  . Diarrhea  . Blood In Stools    HPI Patrick [name] is a 66 y.o. male.  This 66 year old Caucasian male past medical history sig for CAD, CHF, anemia, adenocarcinoma of rectum currently receiving radiation, that presents to the ED today for urinary retention, diarrhea, hematochezia. Patient states that he was just released from the hospital approximately 3 days ago after a 3 day stay for rectal bleeding. Patient is currently being treated for adenocarcinoma of the rectum. He stopped chemotherapy pills proximally 4 days ago. He is still receiving radiation. His last treatment was yesterday. Patient states that when he was admitted to the hospital he was having constipation with small amount of hematochezia. They gave him GoLYTELY and MiraLAX in the hospital that allowed him to have normal bowel movement. States that yesterday he had an episode of diarrhea and noticed a small amount of blood when he wiped on the tissue. He denies any further episodes of blood in his stool. Patient states that he has had urinary retention since yesterday. Patient is currently on Lasix and his Lasix was increased after discharge from the hospital. States that he has not been able to urinate since yesterday. States he feels like his bladder full and he needs to go. Patient states he had this problem before and required a Foley. Patient denies any fever, chills, headache, vision changes, lightheadedness, dizziness, cough, chest pain, shortness of breath, abdominal pain, nausea, emesis, melena. Patient denies any recent antibiotics use. Denies any history of C. difficile.      Past Medical History:  Diagnosis Date  . Arthritis    "right hand; right knee" (06/19/2014)  . CAD (coronary artery disease)    2v CAD with subtotal occlusion of  small co-dominant RCA and borderline lesion in moderate-sized OM-1  . CHF (congestive heart failure) (HCC)    Preserved EF  . Degenerative joint disease of knee, right   . Enlarged prostate   . Hyperlipidemia   . Hypertension   . Pneumonia 05/2014  . Prediabetes 10/03/2014  . Scrotal edema 04/03/2015  . SMALL BOWEL OBSTRUCTION, HX OF 08/21/2007   Annotation: with narrowing in the ileocecal region Qualifier: Diagnosis of  By: Hassell Done FNP, Tori Milks      Patient Active Problem List   Diagnosis Date Noted  . Rectal bleeding   . BRBPR (bright red blood per rectum) 05/29/2016  . Acute blood loss anemia 05/29/2016  . Thrombocytopenia ([place]) 05/29/2016  . Iron deficiency anemia due to chronic blood loss 04/15/2016  . Adenocarcinoma of rectum ([place]) 04/14/2016  . Chronic renal disease, stage III 02/05/2016  . Abnormal finding on cardiovascular stress test 02/05/2016  . Cardiomyopathy, ischemic 06/17/2015  . Chronic systolic heart failure ([place]) 06/17/2015  . Major depressive disorder, recurrent episode ([place]) 04/17/2015  . Hypokalemia 08/29/2014  . Other iron deficiency anemias   . Health care maintenance 03/11/2014  . Obesity, unspecified 10/03/2013  . Dyslipidemia 08/13/2013  . CARDIAC MURMUR 01/26/2010  . MACULAR DEGENERATION 05/02/2008  . ERECTILE DYSFUNCTION 03/13/2007  . Essential hypertension 01/16/2007  . BPH (benign prostatic hyperplasia) 01/16/2007    Past Surgical History:  Procedure Laterality Date  . CARDIAC CATHETERIZATION N/A 02/16/2016   Procedure: Right/Left Heart Cath and Coronary Angiography;  Surgeon: Jolaine Artist, MD;  Location: [facility];  Service: Cardiovascular;  Laterality: N/A;  . COLONOSCOPY N/A 04/01/2016   Procedure: COLONOSCOPY;  Surgeon: Leighton Ruff, MD;  Location: [facility];  Service: Endoscopy;  Laterality: N/A;  . INGUINAL HERNIA REPAIR Left 1990's       Home Medications    Prior to Admission medications   Medication Sig Start  Date End Date Taking? Authorizing Provider  metolazone (ZAROXOLYN) 2.5 MG tablet Take 2.5 mg by mouth daily. 04/28/16  Yes Historical Provider, MD  acetaminophen (TYLENOL) 500 MG tablet Take 1,000 mg by mouth every 6 (six) hours as needed for mild pain.    Historical Provider, MD  Ascorbic Acid (VITAMIN C) 1000 MG tablet Take 2,000 mg by mouth 2 (two) times daily.    Historical Provider, MD  aspirin EC 81 MG EC tablet Take 1 tablet (81 mg total) by mouth daily. 06/03/14   Car[place] N Rumley, DO  capecitabine (XELODA) 500 MG tablet Take #4 tab in morning and #3 tab in evening on days of radiation-Monday-Friday Patient not taking: Reported on 06/03/2016 05/09/16   Truitt Merle, MD  carvedilol (COREG) 25 MG tablet Take 1 tablet (25 mg total) by mouth 2 (two) times daily. 12/08/15   Minus Breeding, MD  ferrous sulfate 325 (65 FE) MG tablet take 1 tablet by mouth once daily WITH BREAKFAST 08/11/15   Kinnie Feil, MD  finasteride (PROSCAR) 5 MG tablet take 1 tablet by mouth once daily 05/18/16   Sela Hua, MD  losartan (COZAAR) 100 MG tablet  05/24/16   Historical Provider, MD  lovastatin (MEVACOR) 20 MG tablet take 1 tablet by mouth at bedtime 05/25/16   Sela Hua, MD  mirtazapine (REMERON SOL-TAB) 15 MG disintegrating tablet place 1 tablet ON TONGUE at bedtime Patient taking differently: Take 15 mg by mouth at bedtime. place 1 tablet ON TONGUE at bedtime 01/25/16   Sela Hua, MD  Multiple Vitamins-Minerals (MULTIVITAMIN WITH MINERALS) tablet Take 1 tablet by mouth daily.    Historical Provider, MD  nitroGLYCERIN (NITROSTAT) 0.4 MG SL tablet Place 1 tablet (0.4 mg total) under the tongue every 5 (five) minutes as needed for chest pain. Patient not taking: Reported on 06/03/2016 04/29/16 07/28/16  Evelene Croon Barrett, PA-C  omega-3 acid ethyl esters (LOVAZA) 1 g capsule take 2 capsules by mouth twice a day 03/02/16   Sela Hua, MD  oxyCODONE-acetaminophen (PERCOCET/ROXICET) 5-325 MG tablet Take 1  tablet by mouth every 8 (eight) hours as needed for severe pain. 05/12/15   Kinnie Feil, MD  potassium chloride SA (K-DUR,KLOR-CON) 20 MEQ tablet take 3 tablets by mouth once daily 06/03/16   Minus Breeding, MD  prochlorperazine (COMPAZINE) 10 MG tablet take 1 tablet by mouth every 6 hours if needed for nausea and vomiting 05/20/16   Historical Provider, MD  tamsulosin (FLOMAX) 0.4 MG CAPS capsule Take 0.4 mg by mouth daily.     Historical Provider, MD  torsemide (DEMADEX) 20 MG tablet take 4 tablets by mouth twice a day Patient taking differently: take 3 tablets by mouth twice a day 03/23/16   Sela Hua, MD  traZODone (DESYREL) 100 MG tablet take 1 tablet by mouth twice a day 05/19/16   Sela Hua, MD    Family History Family History  Problem Relation Age of Onset  . Hypertension Mother   . Diabetes Mother   . Stroke Mother   . Cancer Father 58    Prostate  . Dementia Father   . COPD Sister   .  Arthritis Sister   . Edema Sister     Social History Social History  Substance Use Topics  . Smoking status: Former Smoker    Years: 5.00    Types: Pipe, Landscape architect  . Smokeless tobacco: Never Used     Comment: 06/19/2014 "stopped smoking in ~ 2014; used to smoke a pipe or cigar a couple times/month"  . Alcohol use No     Allergies   Patient has no known allergies.   Review of Systems Review of Systems  Constitutional: Negative for chills and fever.  HENT: Negative for congestion.   Respiratory: Negative for cough and shortness of breath.   Cardiovascular: Negative for chest pain.  Gastrointestinal: Positive for anal bleeding and diarrhea. Negative for abdominal pain, nausea and vomiting.  Genitourinary: Positive for decreased urine volume. Negative for discharge, flank pain, hematuria and testicular pain.  Musculoskeletal: Negative.   Skin: Negative.   Neurological: Negative for dizziness, syncope, weakness, light-headedness, numbness and headaches.  All other systems  reviewed and are negative.    Physical Exam Updated Vital Signs BP 149/83 (BP Location: Left Arm)   Pulse 70   Temp 97.3 F (36.3 C) (Oral)   Resp 18   Ht 5\' 9"  (1.753 m)   Wt 98.4 kg   SpO2 97%   BMI 32.05 kg/m   Physical Exam  Constitutional: He appears well-developed and well-nourished. No distress.  HENT:  Head: Normocephalic and atraumatic.  Mouth/Throat: Oropharynx is clear and moist.  Eyes: Conjunctivae are normal. Pupils are equal, round, and reactive to light. Right eye exhibits no discharge. Left eye exhibits no discharge. No scleral icterus.  Neck: Normal range of motion. Neck supple. No thyromegaly present.  Cardiovascular: Normal rate, regular rhythm, normal heart sounds and intact distal pulses.  Exam reveals no gallop and no friction rub.   No murmur heard. Pulmonary/Chest: Effort normal and breath sounds normal. No respiratory distress.  Abdominal: Soft. Bowel sounds are normal. He exhibits distension (over the suprapubic region). There is tenderness (mild tenderness and pressure with palpation) in the suprapubic area. There is no rigidity, no rebound, no guarding and no CVA tenderness.  Genitourinary:  Genitourinary Comments: Chaperone present for exam. Rectal mass noted that is non tender. No gross blood on exam. Soft brown stool noted in rectal vault. No obvious melena. Normal rectal tone     Musculoskeletal: Normal range of motion.  Lymphadenopathy:    He has no cervical adenopathy.  Neurological: He is alert.  Skin: Skin is warm and dry. Capillary refill takes less than 2 seconds.  Nursing note and vitals reviewed.    ED Treatments / Results  Labs (all labs ordered are listed, but only abnormal results are displayed) Labs Reviewed  URINALYSIS, ROUTINE W REFLEX MICROSCOPIC - Abnormal; Notable for the following:       Result Value   Hgb urine dipstick SMALL (*)    Leukocytes, UA PRESENT (*)    All other components within normal limits    COMPREHENSIVE METABOLIC PANEL - Abnormal; Notable for the following:    Potassium 3.2 (*)    Calcium 8.3 (*)    ALT 12 (*)    Total Bilirubin 1.5 (*)    All other components within normal limits  CBC - Abnormal; Notable for the following:    RBC 3.37 (*)    Hemoglobin 9.2 (*)    HCT 28.3 (*)    RDW 21.6 (*)    Platelets 79 (*)    All other components  within normal limits  URINALYSIS, MICROSCOPIC (REFLEX) - Abnormal; Notable for the following:    Bacteria, UA MANY (*)    Squamous Epithelial / LPF 0-5 (*)    All other components within normal limits  POC OCCULT BLOOD, ED - Abnormal; Notable for the following:    Fecal Occult Bld POSITIVE (*)    All other components within normal limits  LIPASE, BLOOD    EKG  EKG Interpretation None       Radiology No results found.  Procedures Procedures (including critical care time)  Medications Ordered in ED Medications  potassium chloride SA (K-DUR,KLOR-CON) CR tablet 40 mEq (40 mEq Oral Given 06/04/16 1438)     Initial Impression / Assessment and Plan / ED Course  I have reviewed the triage vital signs and the nursing notes.  Pertinent labs & imaging results that were available during my care of the patient were reviewed by me and considered in my medical decision making (see chart for details).  Clinical Course   Patient presents to the ED for urinary retention, diarrhea, and rectal bleeding. Pt with hx of rectal adenocarcinoma and receiving radiation. Patient on lasix and unable to urinate since last night. Foley was placed and 900+ was returned. Patient felt sign relief in suprapubic abd pain and pressure. Repeat abd exam was benign. UA with many bacteria, rbc, and wbc. Likely uti. Will culture urine. Will place patient on abx. Foley bag given. Rectal exam reveals non tender mass that is baseline. No gross signs of melena or hematochezia. Hemoccult positive. Likely from mass. Hgb is 9.2 up from 8 after d/c from hospital. Doubt  acute gi bleed. Known chronic thrombocytopenia. Potassium mildly decreased at 3.2. Patient on lasix and k supplements. He does not take potassium daily. Encouraged to take k supplements and increase k intake. Given oral potasium in the ED. Patient is afebrile, not tachycardic, and normotensive. He feels much improved after foley. HGB stable. Diarrhea likely from bowel regimen in the hospital for constipation. No episode of diarrhea today. Patient is passing gas. Doubt sbo. No recent abx use. No watery foul smelling stools. Doubt C diff. Patient encouraged symptomatic treatment. Encouraged to follow up with urologist and pcp this week. Pt discharged home with foley. Patient is hemodynamically stable, vs stable, nad and feels safe for discharge. Pt is hemodynamically stable, in NAD, & able to ambulate in the ED. Pain has been managed & has no complaints prior to dc. Pt is comfortable with above plan and is stable for discharge at this time. All questions were answered prior to disposition. Strict return precautions for f/u to the ED were discussed. Pt seen and examined by Dr. Zenia Resides who is agreeable to the above plan.    Final Clinical Impressions(s) / ED Diagnoses   Final diagnoses:  Urinary retention  Rectal bleeding  Hypokalemia  Diarrhea, unspecified type    New Prescriptions Discharge Medication List as of 06/04/2016  2:49 PM    START taking these medications   Details  cephALEXin (KEFLEX) 500 MG capsule Take 1 capsule (500 mg total) by mouth 2 (two) times daily., Starting Sat 06/04/2016, Print         Doristine Devoid, PA-C 06/05/16 [phone]

## 2016-06-04 NOTE — ED Notes (Signed)
Lab contacted by RN to check CBC results

## 2016-06-04 NOTE — ED Triage Notes (Signed)
Pt complaint of diarrhea with associated blood noted with wipe onset yesterday. Pt continues to verbalizes minimal urinary output yesterday and retention since 0900 today. Pt hx rectal cancer. Pt currently receives radiation and chemotherapy; last treatment yesterday.

## 2016-06-04 NOTE — ED Notes (Signed)
2 IV start attempts 

## 2016-06-04 NOTE — ED Provider Notes (Signed)
Medical screening examination/treatment/procedure(s) were conducted as a shared visit with non-physician practitioner(s) and myself.  I personally evaluated the patient during the encounter.   EKG Interpretation None     Patient here complaining of decreased urination as well as persistent bloody stools. She did vomit is stable. Likely from his rectal cancer. Did have urinary retention which was relieved after Foley placed. Follow-up instructions given   Lacretia Leigh, MD 06/04/16 1427

## 2016-06-04 NOTE — ED Notes (Signed)
Bed: WA19 Expected date:  Expected time:  Means of arrival:  Comments: 

## 2016-06-04 NOTE — ED Notes (Signed)
Diarrhea and Urinary Protocols placed for pt. PA aware made aware blood in stool orders for complaint have not been placed; if needed will need to be placed.

## 2016-06-06 ENCOUNTER — Ambulatory Visit
Admission: RE | Admit: 2016-06-06 | Discharge: 2016-06-06 | Disposition: A | Discharge status: Home / Self Care | Payer: Medicare Other | Source: Ambulatory Visit | Attending: Radiation Oncology | Admitting: Radiation Oncology

## 2016-06-06 DIAGNOSIS — Z823 Family history of stroke: Secondary | ICD-10-CM | POA: Diagnosis not present

## 2016-06-06 DIAGNOSIS — I2510 Atherosclerotic heart disease of native coronary artery without angina pectoris: Secondary | ICD-10-CM | POA: Diagnosis not present

## 2016-06-06 DIAGNOSIS — C20 Malignant neoplasm of rectum: Secondary | ICD-10-CM | POA: Diagnosis not present

## 2016-06-06 DIAGNOSIS — K769 Liver disease, unspecified: Secondary | ICD-10-CM | POA: Diagnosis not present

## 2016-06-06 DIAGNOSIS — Z79899 Other long term (current) drug therapy: Secondary | ICD-10-CM | POA: Diagnosis not present

## 2016-06-06 DIAGNOSIS — R7303 Prediabetes: Secondary | ICD-10-CM | POA: Diagnosis not present

## 2016-06-06 DIAGNOSIS — Z809 Family history of malignant neoplasm, unspecified: Secondary | ICD-10-CM | POA: Diagnosis not present

## 2016-06-06 DIAGNOSIS — E785 Hyperlipidemia, unspecified: Secondary | ICD-10-CM | POA: Diagnosis not present

## 2016-06-06 DIAGNOSIS — I110 Hypertensive heart disease with heart failure: Secondary | ICD-10-CM | POA: Diagnosis not present

## 2016-06-06 DIAGNOSIS — I509 Heart failure, unspecified: Secondary | ICD-10-CM | POA: Diagnosis not present

## 2016-06-06 DIAGNOSIS — Z7982 Long term (current) use of aspirin: Secondary | ICD-10-CM | POA: Diagnosis not present

## 2016-06-06 DIAGNOSIS — Z833 Family history of diabetes mellitus: Secondary | ICD-10-CM | POA: Diagnosis not present

## 2016-06-06 DIAGNOSIS — K5730 Diverticulosis of large intestine without perforation or abscess without bleeding: Secondary | ICD-10-CM | POA: Diagnosis not present

## 2016-06-06 DIAGNOSIS — Z825 Family history of asthma and other chronic lower respiratory diseases: Secondary | ICD-10-CM | POA: Diagnosis not present

## 2016-06-06 DIAGNOSIS — Z87891 Personal history of nicotine dependence: Secondary | ICD-10-CM | POA: Diagnosis not present

## 2016-06-06 DIAGNOSIS — M1990 Unspecified osteoarthritis, unspecified site: Secondary | ICD-10-CM | POA: Diagnosis not present

## 2016-06-06 DIAGNOSIS — Z510 Encounter for antineoplastic radiation therapy: Secondary | ICD-10-CM | POA: Diagnosis not present

## 2016-06-06 DIAGNOSIS — N400 Enlarged prostate without lower urinary tract symptoms: Secondary | ICD-10-CM | POA: Diagnosis not present

## 2016-06-06 DIAGNOSIS — Z8249 Family history of ischemic heart disease and other diseases of the circulatory system: Secondary | ICD-10-CM | POA: Diagnosis not present

## 2016-06-07 ENCOUNTER — Telehealth: Payer: Self-pay | Admitting: Hematology

## 2016-06-07 ENCOUNTER — Other Ambulatory Visit (HOSPITAL_BASED_OUTPATIENT_CLINIC_OR_DEPARTMENT_OTHER): Payer: Medicare Other

## 2016-06-07 ENCOUNTER — Ambulatory Visit (HOSPITAL_BASED_OUTPATIENT_CLINIC_OR_DEPARTMENT_OTHER): Payer: Medicare Other | Admitting: Hematology

## 2016-06-07 ENCOUNTER — Ambulatory Visit
Admission: RE | Admit: 2016-06-07 | Discharge: 2016-06-07 | Disposition: A | Discharge status: Home / Self Care | Payer: Medicare Other | Source: Ambulatory Visit | Attending: Radiation Oncology | Admitting: Radiation Oncology

## 2016-06-07 ENCOUNTER — Encounter: Payer: Self-pay | Admitting: Hematology

## 2016-06-07 VITALS — BP 145/65 | HR 71 | Temp 98.2°F | Resp 18 | Ht 69.0 in | Wt 205.0 lb

## 2016-06-07 DIAGNOSIS — Z809 Family history of malignant neoplasm, unspecified: Secondary | ICD-10-CM | POA: Diagnosis not present

## 2016-06-07 DIAGNOSIS — I5022 Chronic systolic (congestive) heart failure: Secondary | ICD-10-CM

## 2016-06-07 DIAGNOSIS — Z87891 Personal history of nicotine dependence: Secondary | ICD-10-CM | POA: Diagnosis not present

## 2016-06-07 DIAGNOSIS — Z8249 Family history of ischemic heart disease and other diseases of the circulatory system: Secondary | ICD-10-CM | POA: Diagnosis not present

## 2016-06-07 DIAGNOSIS — D500 Iron deficiency anemia secondary to blood loss (chronic): Secondary | ICD-10-CM

## 2016-06-07 DIAGNOSIS — Z79899 Other long term (current) drug therapy: Secondary | ICD-10-CM | POA: Diagnosis not present

## 2016-06-07 DIAGNOSIS — C20 Malignant neoplasm of rectum: Secondary | ICD-10-CM | POA: Diagnosis not present

## 2016-06-07 DIAGNOSIS — Z833 Family history of diabetes mellitus: Secondary | ICD-10-CM | POA: Diagnosis not present

## 2016-06-07 DIAGNOSIS — K5730 Diverticulosis of large intestine without perforation or abscess without bleeding: Secondary | ICD-10-CM | POA: Diagnosis not present

## 2016-06-07 DIAGNOSIS — K769 Liver disease, unspecified: Secondary | ICD-10-CM

## 2016-06-07 DIAGNOSIS — Z825 Family history of asthma and other chronic lower respiratory diseases: Secondary | ICD-10-CM | POA: Diagnosis not present

## 2016-06-07 DIAGNOSIS — I509 Heart failure, unspecified: Secondary | ICD-10-CM | POA: Diagnosis not present

## 2016-06-07 DIAGNOSIS — D6959 Other secondary thrombocytopenia: Secondary | ICD-10-CM

## 2016-06-07 DIAGNOSIS — I2510 Atherosclerotic heart disease of native coronary artery without angina pectoris: Secondary | ICD-10-CM | POA: Diagnosis not present

## 2016-06-07 DIAGNOSIS — Z510 Encounter for antineoplastic radiation therapy: Secondary | ICD-10-CM | POA: Diagnosis not present

## 2016-06-07 DIAGNOSIS — N400 Enlarged prostate without lower urinary tract symptoms: Secondary | ICD-10-CM | POA: Diagnosis not present

## 2016-06-07 DIAGNOSIS — M1990 Unspecified osteoarthritis, unspecified site: Secondary | ICD-10-CM | POA: Diagnosis not present

## 2016-06-07 DIAGNOSIS — Z823 Family history of stroke: Secondary | ICD-10-CM | POA: Diagnosis not present

## 2016-06-07 DIAGNOSIS — R7303 Prediabetes: Secondary | ICD-10-CM | POA: Diagnosis not present

## 2016-06-07 DIAGNOSIS — Z7982 Long term (current) use of aspirin: Secondary | ICD-10-CM | POA: Diagnosis not present

## 2016-06-07 DIAGNOSIS — I10 Essential (primary) hypertension: Secondary | ICD-10-CM

## 2016-06-07 DIAGNOSIS — D509 Iron deficiency anemia, unspecified: Secondary | ICD-10-CM

## 2016-06-07 DIAGNOSIS — I110 Hypertensive heart disease with heart failure: Secondary | ICD-10-CM | POA: Diagnosis not present

## 2016-06-07 DIAGNOSIS — E785 Hyperlipidemia, unspecified: Secondary | ICD-10-CM | POA: Diagnosis not present

## 2016-06-07 LAB — COMPREHENSIVE METABOLIC PANEL
ALT: 8 U/L (ref 0–55)
AST: 12 U/L (ref 5–34)
Albumin: 3.5 g/dL (ref 3.5–5.0)
Alkaline Phosphatase: 92 U/L (ref 40–150)
Anion Gap: 9 mEq/L (ref 3–11)
BUN: 13.4 mg/dL (ref 7.0–26.0)
CHLORIDE: 103 meq/L (ref 98–109)
CO2: 32 meq/L — AB (ref 22–29)
CREATININE: 1.3 mg/dL (ref 0.7–1.3)
Calcium: 8.8 mg/dL (ref 8.4–10.4)
EGFR: 58 mL/min/{1.73_m2} — ABNORMAL LOW (ref >90)
GLUCOSE: 141 mg/dL — AB (ref 70–140)
POTASSIUM: 3.5 meq/L (ref 3.5–5.1)
SODIUM: 143 meq/L (ref 136–145)
Total Bilirubin: 1.49 mg/dL — ABNORMAL HIGH (ref 0.20–1.20)
Total Protein: 7.1 g/dL (ref 6.4–8.3)

## 2016-06-07 LAB — CBC WITH DIFFERENTIAL/PLATELET
BASO%: 0.2 % (ref 0.0–2.0)
BASOS ABS: 0 10*3/uL (ref 0.0–0.1)
EOS ABS: 0.1 10*3/uL (ref 0.0–0.5)
EOS%: 1 % (ref 0.0–7.0)
HCT: 29.6 % — ABNORMAL LOW (ref 38.4–49.9)
HGB: 9.7 g/dL — ABNORMAL LOW (ref 13.0–17.1)
LYMPH%: 6.9 % — AB (ref 14.0–49.0)
MCH: 28.5 pg (ref 27.2–33.4)
MCHC: 32.8 g/dL (ref 32.0–36.0)
MCV: 87.1 fL (ref 79.3–98.0)
MONO#: 0.6 10*3/uL (ref 0.1–0.9)
MONO%: 10.1 % (ref 0.0–14.0)
NEUT#: 5 10*3/uL (ref 1.5–6.5)
NEUT%: 81.8 % — ABNORMAL HIGH (ref 39.0–75.0)
PLATELETS: 61 10*3/uL — AB (ref 140–400)
RBC: 3.4 10*6/uL — AB (ref 4.20–5.82)
RDW: 21.8 % — ABNORMAL HIGH (ref 11.0–14.6)
WBC: 6.1 10*3/uL (ref 4.0–10.3)
lymph#: 0.4 10*3/uL — ABNORMAL LOW (ref 0.9–3.3)

## 2016-06-07 NOTE — Telephone Encounter (Signed)
Gave patient avs report and appointments for January  °

## 2016-06-07 NOTE — Progress Notes (Signed)
[place]  Telephone:([phone]) [phone] Fax:([phone]) [phone]  Clinic Follow up Note   Patient Care Team: Sela Hua, MD as PCP - General (Family Medicine) Leighton Ruff, MD as Consulting Physician (General Surgery) Kyung Rudd, MD as Consulting Physician (Radiation Oncology) Truitt Merle, MD as Consulting Physician (Hematology) Tania Ade, RN as Registered Nurse Minus Breeding, MD as Consulting Physician (Cardiology) [name], RN as [place] Management 06/07/2016   CHIEF COMPLAINTS:  Follow up rectal cancer  Oncology History   Adenocarcinoma of rectum [facility])   Staging form: Colon and Rectum, AJCC 7th Edition   - Clinical stage from 04/01/2016: T2, N1 - Signed by Truitt Merle, MD on 05/05/2016      Adenocarcinoma of rectum ([place])   04/01/2016 Initial Diagnosis    Adenocarcinoma of rectum ([place])      04/01/2016 Procedure    Colonoscopy showed a fungating nonobstructing medium-sized mass in the distal rectum, measured 3 cm in lengths. Diverticulosis in the sigmoid colon.      04/01/2016 Initial Biopsy    Rectal mass biopsy showed adenocarcinoma, grade 2      04/04/2016 Imaging    CT chest, abdomen and pelvis with contrast showed no primary rectal carcinoma is not well visualized, 7 mm left posterior perirectal and presacral lymph nodes, indeterminate 1.4 cm low to attenuation lesion in the left hepatic lobe, question hepatic cirrhosis, recommend abdominal MRI for further evaluation. No other evidence of distant metastasis.      04/17/2016 Imaging    MR abdomen w w/o contrast IMPRESSION: 1. Morphologic features of liver compatible cirrhosis. 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 2. There is a complex lesion within the left kidney which is only partially visualized on this study which was tailored to assess the liver. When compared with study from 04/04/2016 this is favored to represent a small solid enhancing  neoplasm, i.e. renal cell carcinoma. Urologic consultation is recommended. 3. The indeterminate lesion within the left lobe of liver described on recent CT remains indeterminate. This large reflects motion artifact which degrades the post-contrast sequences. There are no specific findings associated with this lesion to suggest [place] however correlation with AFP levels would be advised. Additionally, in light of the patient's increased risk for hepatoma and liver metastases, followup imaging with repeat contrast enhanced MRI abdomen in 3-6 months is advised to ensure stability of this lesion.      05/04/2016 Imaging    MR Pelvis w w/o contrast 05/04/16 IMPRESSION: Low rectal adenocarcinoma, T2 N1 MX by imaging. Distance from inferior tumor margin to anorectal junction is <1 cm.       HISTORY OF PRESENTING ILLNESS (04/15/2016):  Patrick [name] 66 y.o. male is here because of his recent diagnosed rectal cancer. He presents to our multidisciplinary GI clinic by himself today.  He has had intermittent rectal bleeding for a few years, which she contributes to hemorrhoids, slightly worse lately. He used to have regular bowel movement twice a day, which has been more irregular lately, and occasional loose or watery bowel movement. No significant rectal pain. He was seen by his urologist for routine follow-up of his BPH, and on the rectal exam rectal mass was palpated, and he was referred to colorectal surgeon Dr. Marcello Moores. Colonoscopy on 04/01/2016 showed a fungating nonobstructing medium-sized mass in the distal rectum, 3 cm in length. Biopsy showed adenocarcinoma grade 2. CT CAP was negative distant metastasis, except a 1.4cm indeterminate lesion in the liver.  Review  of systems also reviewed slightly decreased appetite, no signal weight loss. No other pain, nausea, or other symptoms. He has significant cardiomyopathy, nonischemic, has orthopnea, mild, able to function and tolerating routine activity at  home. He is the primary caregiver of his sister, who is continuous nursing home. He is single, no children. Retired. No family history of colon cancer.  CURRENT THERAPY: Concurrent chemotherapy and irradiation with Xeloda 2061m in am and 15041mpm. Started on 05/12/16.  INTERIM HISTORY: [name] for follow-up. He was admitted to hospital for rectal bleeding on 05/29/2016, chemotherapy Xeloda was held due to cytopenia, and he continued radiation. His bleeding spontaneously resolved, and he was discharged on 06/03/2015. He has mild blood on the toilet paper, no significant blood in the stool. He has been tolerating radiation well overall, no significant rectal pain. He has moderate fatigue, able to tolerate his routine activities at home, but he does in a slow pace, and takes breaks in between. No significant dyspnea or chest pain. No other new complaints.  MEDICAL HISTORY:  Past Medical History:  Diagnosis Date  . Arthritis    "right hand; right knee" (06/19/2014)  . CAD (coronary artery disease)    2v CAD with subtotal occlusion of small co-dominant RCA and borderline lesion in moderate-sized OM-1  . CHF (congestive heart failure) (HCC)    Preserved EF  . Degenerative joint disease of knee, right   . Enlarged prostate   . Hyperlipidemia   . Hypertension   . Pneumonia 05/2014  . Prediabetes 10/03/2014  . Scrotal edema 04/03/2015  . SMALL BOWEL OBSTRUCTION, HX OF 08/21/2007   Annotation: with narrowing in the ileocecal region Qualifier: Diagnosis of  By: MaHassell DoneNP, NyTori Milks    SURGICAL HISTORY: Past Surgical History:  Procedure Laterality Date  . CARDIAC CATHETERIZATION N/A 02/16/2016   Procedure: Right/Left Heart Cath and Coronary Angiography;  Surgeon: DaJolaine ArtistMD;  [facility];  Service: Cardiovascular;  Laterality: N/A;  . COLONOSCOPY N/A 04/01/2016   Procedure: COLONOSCOPY;  Surgeon: AlLeighton RuffMD;  Location: [facility];  Service: Endoscopy;   Laterality: N/A;  . INGUINAL HERNIA REPAIR Left 1990's    SOCIAL HISTORY: Social History   Social History  . Marital status: Single    Spouse name: N/A  . Number of children: N/A  . Years of education: N/A   Occupational History  . Not on file.   Social History Main Topics  . Smoking status: Former Smoker    Years: 5.00    Types: Pipe, CiLandscape architect. Smokeless tobacco: Never Used     Comment: 06/19/2014 "stopped smoking in ~ 2014; used to smoke a pipe or cigar a couple times/month"  . Alcohol use No  . Drug use: No  . Sexual activity: No   Other Topics Concern  . Not on file   Social History Narrative   Single, lives w/his sister of whom he is caregiver       FAMILY HISTORY: Family History  Problem Relation Age of Onset  . Hypertension Mother   . Diabetes Mother   . Stroke Mother   . Cancer Father [id number]  Prostate  . Dementia Father   . COPD Sister   . Arthritis Sister   . Edema Sister     ALLERGIES:  has No Known Allergies.  MEDICATIONS:  Current Outpatient Prescriptions  Medication Sig Dispense Refill  . acetaminophen (TYLENOL) 500 MG tablet Take 1,000 mg by mouth every  6 (six) hours as needed for mild pain.    . Ascorbic Acid (VITAMIN C) 1000 MG tablet Take 2,000 mg by mouth 2 (two) times daily.    Marland Kitchen aspirin EC 81 MG EC tablet Take 1 tablet (81 mg total) by mouth daily. 30 tablet 0  . carvedilol (COREG) 25 MG tablet Take 1 tablet (25 mg total) by mouth 2 (two) times daily. 180 tablet 3  . cephALEXin (KEFLEX) 500 MG capsule Take 1 capsule (500 mg total) by mouth 2 (two) times daily. 10 capsule 0  . ferrous sulfate 325 (65 FE) MG tablet take 1 tablet by mouth once daily WITH BREAKFAST 30 tablet 3  . finasteride (PROSCAR) 5 MG tablet take 1 tablet by mouth once daily 90 tablet 1  . lovastatin (MEVACOR) 20 MG tablet take 1 tablet by mouth at bedtime 90 tablet 1  . mirtazapine (REMERON SOL-TAB) 15 MG disintegrating tablet place 1 tablet ON TONGUE at bedtime (Patient  taking differently: Take 15 mg by mouth at bedtime. place 1 tablet ON TONGUE at bedtime) 90 tablet 0  . Multiple Vitamins-Minerals (MULTIVITAMIN WITH MINERALS) tablet Take 1 tablet by mouth daily.    Marland Kitchen omega-3 acid ethyl esters (LOVAZA) 1 g capsule take 2 capsules by mouth twice a day 360 capsule 1  . oxyCODONE-acetaminophen (PERCOCET/ROXICET) 5-325 MG tablet Take 1 tablet by mouth every 8 (eight) hours as needed for severe pain. 90 tablet 0  . potassium chloride SA (K-DUR,KLOR-CON) 20 MEQ tablet take 3 tablets by mouth once daily 270 tablet 3  . prochlorperazine (COMPAZINE) 10 MG tablet take 1 tablet by mouth every 6 hours if needed for nausea and vomiting  0  . tamsulosin (FLOMAX) 0.4 MG CAPS capsule Take 0.4 mg by mouth daily.     Marland Kitchen torsemide (DEMADEX) 20 MG tablet take 4 tablets by mouth twice a day (Patient taking differently: take 3 tablets by mouth twice a day) 720 tablet 0  . traZODone (DESYREL) 100 MG tablet take 1 tablet by mouth twice a day 180 tablet 1  . capecitabine (XELODA) 500 MG tablet Take #4 tab in morning and #3 tab in evening on days of radiation-Monday-Friday (Patient not taking: Reported on 06/07/2016) 210 tablet 0  . nitroGLYCERIN (NITROSTAT) 0.4 MG SL tablet Place 1 tablet (0.4 mg total) under the tongue every 5 (five) minutes as needed for chest pain. (Patient not taking: Reported on 06/07/2016) 25 tablet 3   No current facility-administered medications for this visit.     REVIEW OF SYSTEMS:   Constitutional: Denies fevers, chills or abnormal night sweats Eyes: Denies blurriness of vision, double vision or watery eyes Ears, nose, mouth, throat, and face: Denies mucositis or sore throat Respiratory: Denies cough, dyspnea or wheezes (+) SOB on exertion. Cardiovascular: Denies palpitation, chest discomfort or lower extremity swelling Gastrointestinal:  Denies heartburn. (+) nausea. (+) loose stool (+) blood in stool Skin: Denies abnormal skin rashes Lymphatics: Denies new  lymphadenopathy or easy bruising Neurological:Denies numbness, tingling or new weaknesses Behavioral/Psych: Mood is stable, no new changes  All other systems were reviewed with the patient and are negative.  PHYSICAL EXAMINATION: ECOG PERFORMANCE STATUS: 1 - Symptomatic but completely ambulatory  Vitals:   06/07/16 1352  BP: (!) 145/65  Pulse: 71  Resp: 18  Temp: 98.2 F (36.8 C)   Filed Weights   06/07/16 1352  Weight: 205 lb (93 kg)    GENERAL:alert, no distress and comfortable SKIN: skin color, texture, turgor are normal, no rashes  or significant lesions EYES: normal, conjunctiva are pink and non-injected, sclera clear OROPHARYNX:no exudate, no erythema and lips, buccal mucosa, and tongue normal  NECK: supple, thyroid normal size, non-tender, without nodularity LYMPH:  no palpable lymphadenopathy in the cervical, axillary or inguinal LUNGS: clear to auscultation and percussion with normal breathing effort HEART: regular rate & rhythm and no murmurs and no lower extremity edema ABDOMEN:abdomen soft, non-tender and normal bowel sounds, rectal exam showed a firm mass in the low rectum anteriorly, no bleeding. Bilateral inguinal lymph nodes were negative. Musculoskeletal:no cyanosis of digits and no clubbing  PSYCH: alert & oriented x 3 with fluent speech NEURO: no focal motor/sensory deficits  LABORATORY DATA:  I have reviewed the data as listed CBC Latest Ref Rng & Units 06/07/2016 06/04/2016 06/01/2016  WBC 4.0 - 10.3 10e3/uL 6.1 5.6 4.1  Hemoglobin 13.0 - 17.1 g/dL 9.7(L) 9.2(L) 8.2(L)  Hematocrit 38.4 - 49.9 % 29.6(L) 28.3(L) 24.5(L)  Platelets 140 - 400 10e3/uL 61(L) 79(L) 60(L)   CMP Latest Ref Rng & Units 06/07/2016 06/04/2016 06/02/2016  Glucose 70 - 140 mg/dl 141(H) 95 116(H)  BUN 7.0 - 26.0 mg/dL 13._0 Creatinine 0.7 - 1.3 mg/dL 1.3 1.18 1.17  Sodium 136 - 145 mEq/L 143 140 144  Potassium 3.5 - 5.1 mEq/L 3.5 3.2(L) 3.7  Chloride 101 - 111 mmol/L - 106 111  CO2 22  - 29 mEq/L 32(H) 25 24  Calcium 8.4 - 10.4 mg/dL 8.8 8.3(L) 8.2(L)  Total Protein 6.4 - 8.3 g/dL 7.1 6.9 -  Total Bilirubin 0.20 - 1.20 mg/dL 1.49(H) 1.5(H) -  Alkaline Phos 40 - 150 U/L 92 81 -  AST 5 - 34 U/L 12 19 -  ALT 0 - 55 U/L 8 12(L) -   PATHOLOGY REPORT  Diagnosis 04/01/2016 Rectum, biopsy, mass INVASIVE ADENOCARCINOMA, GRADE 2 MARGIN OF RESECTION IS POSITIVE FOR ADENOCARCINOMA  MMR-normal   RADIOGRAPHIC STUDIES: I have personally reviewed the radiological images as listed and agreed with the findings in the report. Dg Abd Acute W/chest  Result Date: 05/29/2016 CLINICAL DATA:  No bowel movement in 5 days.  Rectal bleeding. EXAM: DG ABDOMEN ACUTE W/ 1V CHEST COMPARISON:  None. FINDINGS: There is no evidence of dilated bowel loops or free intraperitoneal air. Gallstones are noted in the gallbladder. The heart size is enlarged. The mediastinal contour is normal. Both lungs are clear. IMPRESSION: No bowel obstruction or free air. Gallstones identified. No acute cardiopulmonary disease. Electronically Signed   By: Abelardo Diesel M.D.   On: 05/29/2016 20:56   Dg Abd Portable 1v  Result Date: 06/01/2016 CLINICAL DATA:  Constipation.  History of rectal cancer . EXAM: PORTABLE ABDOMEN - 1 VIEW COMPARISON:  05/29/2016.  CT 04/04/2016. FINDINGS: Multiple large bladder stones again noted . Multiple pelvic calcifications system phleboliths. Moderate stool volume. No bowel distention. Degenerative changes lumbar spine and both hips. IMPRESSION: 1. Multiple large gallstones again noted. No acute abnormality identified. 2.  Moderate stool volume, constipation cannot be excluded . Electronically Signed   By: Marcello Moores  Register   On: 06/01/2016 13:01   COLONOSCOPY 04/01/2016 Dr. Marcello Moores  A fungating non-obstructing medium-sized mass was found in the distal rectum. The mass was noncircumferential. The mass measured three cm in length. No bleeding was present. Biopsies were taken with a cold forceps for  histology. IMPRESSION  - Diverticulosis in the sigmoid colon. 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 Biopsied.  ASSESSMENT & PLAN: 66 y.o. Caucasian male, with past medical history of  hypertension, chronic systolic heart failure with EF of 20-25%, iron deficient anemia, presented with a palpable rectal mass.  1. Adenoma of rectum, low anterior rectum, cT2N1M0, stage IIIA -I reviewed his CT scan, endoscopy, and biopsy results in details with patient -His CT scan reviewed no definitive evidence of distant metastasis, however he a 1.4 cm indeterminate liver lesion, need further workup.  -His pelvic MRI showed T2N1 disease, I reviewed with patient -His abdominal MRI showed indeterminate liver lesion, needs close follow-up. He also has imaging evidence of liver cirrhosis, unclear etiology, I'll check his AFP to ruled out hepatocellular carcinoma. -We reviewed the natural history of rectal cancer and treatment options -Given his locally advanced stage IIIA disease, I recommended him to have neoadjuvant chemoradiation. Option of Xeloda and 5-fu infusion were discussed with patient in detail, he opted Xeloda  -He has significant non-ischemia cardiomyopathy, which needs to be watched closely during chemo and radiation  -He has been tolerating radiation well, concurrent chemotherapy Xeloda has been held since 05/29/2016 due to significant cytopenia.  -Lab reviewed, he still has moderate thrombocytopenia, we'll continue holding Xeloda for now  2. HTN, chronic systolic heart failure with EF 20-25% -He will continue follow-up with his primary care physician and cardiologist -Creatinine 1.6 on 05/19/16, worse than before. Hold the Metolazone and decrease the Torsemide to 3 tablets twice daily. -Slightly increase his water intake to 40-50 oz a day. -He will follow-up with his cardiologist closely  3. Iron deficient anemia -His on study in 2016 was consistent with iron deficiency -He is on oral iron,  we'll continue -Iron on 04/15/16 was 44. Do not need IV iron at this time.  4. Liver lesion -He has a 1.4 cm indeterminate liver lesion in the left lobe, found on his staging CT scan, and was evaluated by MRI, no typical MRI image characteristics of hepatocellular carcinoma, however image quality was limited. Giving the imaging evidence of liver cirrhosis, I'll check AFP. -MRI of the abd on 04/17/16 shows the liver lesion remains indeterminate. Will repeat in the future.  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 The patient knows to call the clinic with any problems, questions or concerns. I spent 20 minutes counseling the patient face to face. The total time spent in the appointment was 25 minutes and more than 50% was on counseling.     Truitt Merle, MD 06/07/2016

## 2016-06-08 ENCOUNTER — Encounter: Payer: Self-pay | Admitting: Radiation Oncology

## 2016-06-08 ENCOUNTER — Ambulatory Visit: Payer: Medicare Other

## 2016-06-08 DIAGNOSIS — Z809 Family history of malignant neoplasm, unspecified: Secondary | ICD-10-CM | POA: Diagnosis not present

## 2016-06-08 DIAGNOSIS — Z823 Family history of stroke: Secondary | ICD-10-CM | POA: Diagnosis not present

## 2016-06-08 DIAGNOSIS — I509 Heart failure, unspecified: Secondary | ICD-10-CM | POA: Diagnosis not present

## 2016-06-08 DIAGNOSIS — Z7982 Long term (current) use of aspirin: Secondary | ICD-10-CM | POA: Diagnosis not present

## 2016-06-08 DIAGNOSIS — Z510 Encounter for antineoplastic radiation therapy: Secondary | ICD-10-CM | POA: Diagnosis not present

## 2016-06-08 DIAGNOSIS — I110 Hypertensive heart disease with heart failure: Secondary | ICD-10-CM | POA: Diagnosis not present

## 2016-06-08 DIAGNOSIS — Z833 Family history of diabetes mellitus: Secondary | ICD-10-CM | POA: Diagnosis not present

## 2016-06-08 DIAGNOSIS — E785 Hyperlipidemia, unspecified: Secondary | ICD-10-CM | POA: Diagnosis not present

## 2016-06-08 DIAGNOSIS — K5730 Diverticulosis of large intestine without perforation or abscess without bleeding: Secondary | ICD-10-CM | POA: Diagnosis not present

## 2016-06-08 DIAGNOSIS — I2510 Atherosclerotic heart disease of native coronary artery without angina pectoris: Secondary | ICD-10-CM | POA: Diagnosis not present

## 2016-06-08 DIAGNOSIS — Z825 Family history of asthma and other chronic lower respiratory diseases: Secondary | ICD-10-CM | POA: Diagnosis not present

## 2016-06-08 DIAGNOSIS — M1990 Unspecified osteoarthritis, unspecified site: Secondary | ICD-10-CM | POA: Diagnosis not present

## 2016-06-08 DIAGNOSIS — N400 Enlarged prostate without lower urinary tract symptoms: Secondary | ICD-10-CM | POA: Diagnosis not present

## 2016-06-08 DIAGNOSIS — K769 Liver disease, unspecified: Secondary | ICD-10-CM | POA: Diagnosis not present

## 2016-06-08 DIAGNOSIS — Z87891 Personal history of nicotine dependence: Secondary | ICD-10-CM | POA: Diagnosis not present

## 2016-06-08 DIAGNOSIS — Z8249 Family history of ischemic heart disease and other diseases of the circulatory system: Secondary | ICD-10-CM | POA: Diagnosis not present

## 2016-06-08 DIAGNOSIS — R7303 Prediabetes: Secondary | ICD-10-CM | POA: Diagnosis not present

## 2016-06-08 DIAGNOSIS — Z79899 Other long term (current) drug therapy: Secondary | ICD-10-CM | POA: Diagnosis not present

## 2016-06-08 DIAGNOSIS — C20 Malignant neoplasm of rectum: Secondary | ICD-10-CM | POA: Diagnosis not present

## 2016-06-09 ENCOUNTER — Other Ambulatory Visit: Payer: Medicare Other

## 2016-06-09 ENCOUNTER — Ambulatory Visit
Admission: RE | Admit: 2016-06-09 | Discharge: 2016-06-09 | Disposition: A | Discharge status: Home / Self Care | Payer: Medicare Other | Source: Ambulatory Visit | Attending: Radiation Oncology | Admitting: Radiation Oncology

## 2016-06-09 DIAGNOSIS — Z8249 Family history of ischemic heart disease and other diseases of the circulatory system: Secondary | ICD-10-CM | POA: Diagnosis not present

## 2016-06-09 DIAGNOSIS — Z833 Family history of diabetes mellitus: Secondary | ICD-10-CM | POA: Diagnosis not present

## 2016-06-09 DIAGNOSIS — K5730 Diverticulosis of large intestine without perforation or abscess without bleeding: Secondary | ICD-10-CM | POA: Diagnosis not present

## 2016-06-09 DIAGNOSIS — M1990 Unspecified osteoarthritis, unspecified site: Secondary | ICD-10-CM | POA: Diagnosis not present

## 2016-06-09 DIAGNOSIS — I110 Hypertensive heart disease with heart failure: Secondary | ICD-10-CM | POA: Diagnosis not present

## 2016-06-09 DIAGNOSIS — Z79899 Other long term (current) drug therapy: Secondary | ICD-10-CM | POA: Diagnosis not present

## 2016-06-09 DIAGNOSIS — Z809 Family history of malignant neoplasm, unspecified: Secondary | ICD-10-CM | POA: Diagnosis not present

## 2016-06-09 DIAGNOSIS — I2510 Atherosclerotic heart disease of native coronary artery without angina pectoris: Secondary | ICD-10-CM | POA: Diagnosis not present

## 2016-06-09 DIAGNOSIS — N400 Enlarged prostate without lower urinary tract symptoms: Secondary | ICD-10-CM | POA: Diagnosis not present

## 2016-06-09 DIAGNOSIS — Z87891 Personal history of nicotine dependence: Secondary | ICD-10-CM | POA: Diagnosis not present

## 2016-06-09 DIAGNOSIS — E785 Hyperlipidemia, unspecified: Secondary | ICD-10-CM | POA: Diagnosis not present

## 2016-06-09 DIAGNOSIS — C20 Malignant neoplasm of rectum: Secondary | ICD-10-CM | POA: Diagnosis not present

## 2016-06-09 DIAGNOSIS — R338 Other retention of urine: Secondary | ICD-10-CM | POA: Diagnosis not present

## 2016-06-09 DIAGNOSIS — Z510 Encounter for antineoplastic radiation therapy: Secondary | ICD-10-CM | POA: Diagnosis not present

## 2016-06-09 DIAGNOSIS — Z825 Family history of asthma and other chronic lower respiratory diseases: Secondary | ICD-10-CM | POA: Diagnosis not present

## 2016-06-09 DIAGNOSIS — I509 Heart failure, unspecified: Secondary | ICD-10-CM | POA: Diagnosis not present

## 2016-06-09 DIAGNOSIS — Z7982 Long term (current) use of aspirin: Secondary | ICD-10-CM | POA: Diagnosis not present

## 2016-06-09 DIAGNOSIS — Z823 Family history of stroke: Secondary | ICD-10-CM | POA: Diagnosis not present

## 2016-06-09 DIAGNOSIS — K769 Liver disease, unspecified: Secondary | ICD-10-CM | POA: Diagnosis not present

## 2016-06-09 DIAGNOSIS — R7303 Prediabetes: Secondary | ICD-10-CM | POA: Diagnosis not present

## 2016-06-10 ENCOUNTER — Encounter: Payer: Self-pay | Admitting: Radiation Oncology

## 2016-06-10 ENCOUNTER — Ambulatory Visit: Payer: Medicare Other | Admitting: Hematology

## 2016-06-10 ENCOUNTER — Other Ambulatory Visit: Payer: Self-pay | Admitting: *Deleted

## 2016-06-10 ENCOUNTER — Ambulatory Visit
Admission: RE | Admit: 2016-06-10 | Discharge: 2016-06-10 | Disposition: A | Discharge status: Home / Self Care | Payer: Medicare Other | Source: Ambulatory Visit | Attending: Radiation Oncology | Admitting: Radiation Oncology

## 2016-06-10 ENCOUNTER — Encounter: Payer: Self-pay | Admitting: *Deleted

## 2016-06-10 VITALS — BP 120/62 | HR 51 | Temp 98.0°F | Resp 20 | Wt 201.8 lb

## 2016-06-10 DIAGNOSIS — Z510 Encounter for antineoplastic radiation therapy: Secondary | ICD-10-CM | POA: Diagnosis not present

## 2016-06-10 DIAGNOSIS — Z7982 Long term (current) use of aspirin: Secondary | ICD-10-CM | POA: Diagnosis not present

## 2016-06-10 DIAGNOSIS — C20 Malignant neoplasm of rectum: Secondary | ICD-10-CM

## 2016-06-10 DIAGNOSIS — M1990 Unspecified osteoarthritis, unspecified site: Secondary | ICD-10-CM | POA: Diagnosis not present

## 2016-06-10 DIAGNOSIS — Z825 Family history of asthma and other chronic lower respiratory diseases: Secondary | ICD-10-CM | POA: Diagnosis not present

## 2016-06-10 DIAGNOSIS — Z833 Family history of diabetes mellitus: Secondary | ICD-10-CM | POA: Diagnosis not present

## 2016-06-10 DIAGNOSIS — Z8249 Family history of ischemic heart disease and other diseases of the circulatory system: Secondary | ICD-10-CM | POA: Diagnosis not present

## 2016-06-10 DIAGNOSIS — I509 Heart failure, unspecified: Secondary | ICD-10-CM | POA: Diagnosis not present

## 2016-06-10 DIAGNOSIS — R7303 Prediabetes: Secondary | ICD-10-CM | POA: Diagnosis not present

## 2016-06-10 DIAGNOSIS — E785 Hyperlipidemia, unspecified: Secondary | ICD-10-CM | POA: Diagnosis not present

## 2016-06-10 DIAGNOSIS — N400 Enlarged prostate without lower urinary tract symptoms: Secondary | ICD-10-CM | POA: Diagnosis not present

## 2016-06-10 DIAGNOSIS — Z823 Family history of stroke: Secondary | ICD-10-CM | POA: Diagnosis not present

## 2016-06-10 DIAGNOSIS — I2510 Atherosclerotic heart disease of native coronary artery without angina pectoris: Secondary | ICD-10-CM | POA: Diagnosis not present

## 2016-06-10 DIAGNOSIS — K769 Liver disease, unspecified: Secondary | ICD-10-CM | POA: Diagnosis not present

## 2016-06-10 DIAGNOSIS — K5730 Diverticulosis of large intestine without perforation or abscess without bleeding: Secondary | ICD-10-CM | POA: Diagnosis not present

## 2016-06-10 DIAGNOSIS — Z79899 Other long term (current) drug therapy: Secondary | ICD-10-CM | POA: Diagnosis not present

## 2016-06-10 DIAGNOSIS — I110 Hypertensive heart disease with heart failure: Secondary | ICD-10-CM | POA: Diagnosis not present

## 2016-06-10 DIAGNOSIS — Z87891 Personal history of nicotine dependence: Secondary | ICD-10-CM | POA: Diagnosis not present

## 2016-06-10 DIAGNOSIS — Z809 Family history of malignant neoplasm, unspecified: Secondary | ICD-10-CM | POA: Diagnosis not present

## 2016-06-10 NOTE — Progress Notes (Addendum)
Weekly rad txs rectum   19/28  Completed,   Having more formed stools, uses baby wipes prn,  no nausea, slight gas in abdomen, appetite good, home health nurse visit at patient home today , pain in right hip and thigh 2/3 on 10,  Scale, had gone to ER last Saturday and couldn't void, slight dysuria still at times,  Has a foley  Catheter bag  Right thigh   Drinking more water stated 3:10 PM BP 120/62 (BP Location: Left Arm, Patient Position: Sitting, Cuff Size: Normal)   Pulse (!) 51   Temp 98 F (36.7 C) (Oral)   Resp 20   Wt 201 lb 12.8 oz (91.5 kg)   BMI 29.80 kg/m   Wt Readings from Last 3 Encounters:  06/10/16 201 lb 12.8 oz (91.5 kg)  06/10/16 202 lb (91.6 kg)  06/09/16 201 lb 3.2 oz (91.3 kg)

## 2016-06-10 NOTE — Progress Notes (Signed)
Department of Radiation Oncology  Phone:  [phone] Fax:        [phone]  Weekly Treatment Note    Name: Patrick [name] Date: 06/10/2016 MRN: [id number] DOB: 1951-03-13   Diagnosis:     ICD-9-CM ICD-10-CM   1. Adenocarcinoma of rectum (HCC) 154.1 C20      Current dose: 34.2 Gy  Current fraction: 19   MEDICATIONS: Current Outpatient Prescriptions  Medication Sig Dispense Refill  . acetaminophen (TYLENOL) 500 MG tablet Take 1,000 mg by mouth every 6 (six) hours as needed for mild pain.    . Ascorbic Acid (VITAMIN C) 1000 MG tablet Take 2,000 mg by mouth 2 (two) times daily.    . carvedilol (COREG) 25 MG tablet Take 1 tablet (25 mg total) by mouth 2 (two) times daily. 180 tablet 3  . cephALEXin (KEFLEX) 500 MG capsule Take 1 capsule (500 mg total) by mouth 2 (two) times daily. 10 capsule 0  . ferrous sulfate 325 (65 FE) MG tablet take 1 tablet by mouth once daily WITH BREAKFAST 30 tablet 3  . finasteride (PROSCAR) 5 MG tablet take 1 tablet by mouth once daily 90 tablet 1  . lovastatin (MEVACOR) 20 MG tablet take 1 tablet by mouth at bedtime 90 tablet 1  . mirtazapine (REMERON SOL-TAB) 15 MG disintegrating tablet place 1 tablet ON TONGUE at bedtime (Patient taking differently: Take 15 mg by mouth at bedtime. place 1 tablet ON TONGUE at bedtime) 90 tablet 0  . Multiple Vitamins-Minerals (MULTIVITAMIN WITH MINERALS) tablet Take 1 tablet by mouth daily.    . nitroGLYCERIN (NITROSTAT) 0.4 MG SL tablet Place 1 tablet (0.4 mg total) under the tongue every 5 (five) minutes as needed for chest pain. 25 tablet 3  . omega-3 acid ethyl esters (LOVAZA) 1 g capsule take 2 capsules by mouth twice a day 360 capsule 1  . oxyCODONE-acetaminophen (PERCOCET/ROXICET) 5-325 MG tablet Take 1 tablet by mouth every 8 (eight) hours as needed for severe pain. 90 tablet 0  . potassium chloride SA (K-DUR,KLOR-CON) 20 MEQ tablet take 3 tablets by mouth once daily 270 tablet 3  . prochlorperazine  (COMPAZINE) 10 MG tablet take 1 tablet by mouth every 6 hours if needed for nausea and vomiting  0  . tamsulosin (FLOMAX) 0.4 MG CAPS capsule Take 0.4 mg by mouth daily.     Marland Kitchen torsemide (DEMADEX) 20 MG tablet take 4 tablets by mouth twice a day (Patient taking differently: take 3 tablets by mouth twice a day) 720 tablet 0  . traZODone (DESYREL) 100 MG tablet take 1 tablet by mouth twice a day 180 tablet 1  . aspirin EC 81 MG EC tablet Take 1 tablet (81 mg total) by mouth daily. (Patient not taking: Reported on 06/10/2016) 30 tablet 0  . capecitabine (XELODA) 500 MG tablet Take #4 tab in morning and #3 tab in evening on days of radiation-Monday-Friday (Patient not taking: Reported on 06/10/2016) 210 tablet 0   No current facility-administered medications for this encounter.      ALLERGIES: Patient has no known allergies.   LABORATORY DATA:  Lab Results  Component Value Date   WBC 6.1 06/07/2016   HGB 9.7 (L) 06/07/2016   HCT 29.6 (L) 06/07/2016   MCV 87.1 06/07/2016   PLT 61 (L) 06/07/2016   Lab Results  Component Value Date   NA 143 06/07/2016   K 3.5 06/07/2016   CL 106 06/04/2016   CO2 32 (H) 06/07/2016   Lab Results  Component Value Date   ALT 8 06/07/2016   AST 12 06/07/2016   ALKPHOS 92 06/07/2016   BILITOT 1.49 (H) 06/07/2016     NARRATIVE: Patrick [name] was seen today for weekly treatment management. The chart was checked and the patient's films were reviewed.  Weekly rad txs rectum   19/28  Completed,   Having more formed stools, uses baby wipes prn,  no nausea, slight gas in abdomen, appetite good, home health nurse visit at patient home today , pain in right hip and thigh 2/3 on 10,  Scale, had gone to ER last Saturday and couldn't void, slight dysuria still at times,  Has a foley  Catheter bag  Right thigh   Drinking more water stated 6:26 PM BP 120/62 (BP Location: Left Arm, Patient Position: Sitting, Cuff Size: Normal)   Pulse (!) 51   Temp 98 F (36.7 C) (Oral)    Resp 20   Wt 201 lb 12.8 oz (91.5 kg)   BMI 29.80 kg/m   Wt Readings from Last 3 Encounters:  06/10/16 201 lb 12.8 oz (91.5 kg)  06/10/16 202 lb (91.6 kg)  06/09/16 201 lb 3.2 oz (91.3 kg)    PHYSICAL EXAMINATION: weight is 201 lb 12.8 oz (91.5 kg). His oral temperature is 98 F (36.7 C). His blood pressure is 120/62 and his pulse is 51 (abnormal). His respiration is 20.        ASSESSMENT: The patient is doing satisfactorily with treatment.  PLAN: We will continue with the patient's radiation treatment as planned.

## 2016-06-10 NOTE — Patient Outreach (Signed)
[facility]) Care Management   06/10/2016  Patrick [name] 16-May-1951 [id number]  Patrick [name] is an 66 y.o. male  Subjective:   Member report that he is "alright."  He denies complaints of pain or discomfort at this time, but does report some weakness and feelings of being tired.  He missed his treatment Wednesday due to caring for his sister, but did go yesterday and will be able to attend today.  He discusses the difficulty of having "so much coming at me all at once" stating that he feels like sometimes he can't do it all.    He is compliant with medications, stopped taking chemo medication on 12/31 and has also recently stopped taking aspirin, both per oncology orders.  He had a visit to the ED within the past week for urinary retention, has foley leg bag in place.  He report that he was evaluated this week, decision made not to remove yet.  He denies performing daily weights, stating that he does not have a level floor to weigh, but he has someone coming to put a new floor in the bathroom today.  He will begin daily weights once that is done.  He report that he "do the best I can" with adhering to low sodium diet, but report that they eat what they (he and his sister) can afford.    Objective:   Review of Systems  HENT: Negative.   Eyes: Negative.   Respiratory: Negative.   Cardiovascular: Negative.   Gastrointestinal: Negative.   Genitourinary:       Retention, has foley leg bag  Musculoskeletal: Negative.   Skin: Negative.   Neurological: Positive for weakness.  Endo/Heme/Allergies: Negative.   Psychiatric/Behavioral: Positive for depression.    Physical Exam  Constitutional: He is oriented to person, place, and time. He appears well-developed and well-nourished.  Neck: Normal range of motion.  Cardiovascular: Normal rate, regular rhythm and normal heart sounds.   Respiratory: Effort normal and breath sounds normal.  GI: Soft. Bowel sounds are normal.   Musculoskeletal: Normal range of motion.  Neurological: He is alert and oriented to person, place, and time.  Skin: Skin is warm and dry.   BP 120/68   Pulse (!) 59   Resp 18   Ht 1.753 m ('5\' 9"' )   Wt 202 lb (91.6 kg)   SpO2 96%   BMI 29.83 kg/m   Encounter Medications:   Outpatient Encounter Prescriptions as of 06/10/2016  Medication Sig Note  . acetaminophen (TYLENOL) 500 MG tablet Take 1,000 mg by mouth every 6 (six) hours as needed for mild pain.   . Ascorbic Acid (VITAMIN C) 1000 MG tablet Take 2,000 mg by mouth 2 (two) times daily.   . carvedilol (COREG) 25 MG tablet Take 1 tablet (25 mg total) by mouth 2 (two) times daily.   . cephALEXin (KEFLEX) 500 MG capsule Take 1 capsule (500 mg total) by mouth 2 (two) times daily.   . ferrous sulfate 325 (65 FE) MG tablet take 1 tablet by mouth once daily WITH BREAKFAST   . finasteride (PROSCAR) 5 MG tablet take 1 tablet by mouth once daily   . lovastatin (MEVACOR) 20 MG tablet take 1 tablet by mouth at bedtime   . mirtazapine (REMERON SOL-TAB) 15 MG disintegrating tablet place 1 tablet ON TONGUE at bedtime (Patient taking differently: Take 15 mg by mouth at bedtime. place 1 tablet ON TONGUE at bedtime)   . Multiple Vitamins-Minerals (MULTIVITAMIN WITH MINERALS)  tablet Take 1 tablet by mouth daily.   . nitroGLYCERIN (NITROSTAT) 0.4 MG SL tablet Place 1 tablet (0.4 mg total) under the tongue every 5 (five) minutes as needed for chest pain.   Marland Kitchen omega-3 acid ethyl esters (LOVAZA) 1 g capsule take 2 capsules by mouth twice a day   . oxyCODONE-acetaminophen (PERCOCET/ROXICET) 5-325 MG tablet Take 1 tablet by mouth every 8 (eight) hours as needed for severe pain.   . potassium chloride SA (K-DUR,KLOR-CON) 20 MEQ tablet take 3 tablets by mouth once daily   . prochlorperazine (COMPAZINE) 10 MG tablet take 1 tablet by mouth every 6 hours if needed for nausea and vomiting 06/03/2016: Received from: External Pharmacy  . tamsulosin (FLOMAX) 0.4 MG CAPS  capsule Take 0.4 mg by mouth daily.    Marland Kitchen torsemide (DEMADEX) 20 MG tablet take 4 tablets by mouth twice a day (Patient taking differently: take 3 tablets by mouth twice a day)   . traZODone (DESYREL) 100 MG tablet take 1 tablet by mouth twice a day   . aspirin EC 81 MG EC tablet Take 1 tablet (81 mg total) by mouth daily. (Patient not taking: Reported on 06/10/2016)   . capecitabine (XELODA) 500 MG tablet Take #4 tab in morning and #3 tab in evening on days of radiation-Monday-Friday (Patient not taking: Reported on 06/10/2016)    No facility-administered encounter medications on file as of 06/10/2016.     Functional Status:   In your present state of health, do you have any difficulty performing the following activities: 06/10/2016 05/30/2016  Hearing? N N  Vision? N N  Difficulty concentrating or making decisions? N N  Walking or climbing stairs? N N  Dressing or bathing? N N  Doing errands, shopping? N N  Preparing Food and eating ? N -  Using the Toilet? Y -  In the past six months, have you accidently leaked urine? N -  Do you have problems with loss of bowel control? N -  Managing your Medications? N -  Managing your Finances? N -  Housekeeping or managing your Housekeeping? N -  Some recent data might be hidden    Fall/Depression Screening:    PHQ 2/9 Scores 06/10/2016 01/25/2016 07/14/2015 05/05/2015 05/05/2015 04/17/2015 03/30/2015  PHQ - 2 Score 6 0 0 '2 1 4 6  ' PHQ- 9 Score 16 - - 5 - 10 -   Fall Risk  06/10/2016 01/25/2016 07/14/2015 05/12/2015 05/05/2015  Falls in the past year? No No No Yes Yes  Number falls in past yr: - - - 2 or more 2 or more  Injury with Fall? - - - No No  Risk Factor Category  - - - High Fall Risk -  Risk for fall due to : - - - History of fall(s) -  Follow up - - - Education provided;Falls prevention discussed -    Assessment:    Met with member at scheduled time, sister present during visit.  Member denies concern regarding caring for self, able to  provide his own transportation, meals and ADLs.  His major concern is the amount of time/energy needed to care for his sister.  Although this is the case, he also state that he is doing all that he can because he "made a promise" to care for her.  He is aware that she has consistently refused placement unless it is somewhere the both of them can go.  Discussed placement again, made aware that he may be a candidate for  assisted/senior living, but she will have to consider the level of care that she is requiring.    Member discussed feelings of depression due to the complications of his treatment as well as the stress of caring for his sister.  He state he had a "meltdown" yesterday because he "just couldn't get everything done."  He denies discussing this with his doctor, will discuss with the cancer center and look into therapy sessions offered there.  His sister is in tears hearing the member talk about the amount of stress he is under, but neither of them are ready to make the decision to apply for Medicaid and proceed with the process of placement.  This care manager inquired about the plan if the member is too weak to care for his sister, he report that he has not thought of that and he "thank God I have still been able to do it."  Strongly urged them to consider, keeping in mind that he is facing surgery within the next few months and the assistance that his sister is receiving now is not long term.  He is made aware that the LCSW has closed his case but remain open with his sister, advised for them to discuss placement when she is contacted.  Member denies any further concerns, confirmed that he has contact information for this care manager.  Advised to contact with questions.  Plan:   Will collaborate with social worker regarding PHQ9 score and possible resources for depression. Will notify MD of involvement and PHQ9 score. Will follow up with member next week.  [facility] CM Care Plan Problem One    Flowsheet Row Most Recent Value  Care Plan Problem One  Risk for readmission related to rectal bleeding (colorectal cancer) as evidenced by recent hospitalization  Role Documenting the Problem One  Care Management Coordinator  Care Plan for Problem One  Active  THN Long Term Goal (31-90 days)  Member will not be readmited to hospital within the next 31 days  THN Long Term Goal Start Date  06/03/16  Interventions for Problem One Long Term Goal  Discussed with member the importance of following discharge instructions, including follow up appointments, medications, diet, to decrease the risk of readmission  [facility]N CM Short Term Goal #1 (0-30 days)  Member will report compliance with daily radiation treatments within the next 2 weeks  [facility]N CM Short Term Goal #1 Start Date  06/03/16  Interventions for Short Term Goal #1  Discussed with member the importance of his daily treatments in effort to have optimal chance of cancer treatment.  Advised to contact this care manager or social worker if transportation becomes issue  [facility]N CM Short Term Goal #2 (0-30 days)  Member will report no rectal bleeding over the next 2 weeks  [facility]N CM Short Term Goal #2 Start Date  06/03/16  [facility] CM Short Term Goal #2 Met Date  06/10/16  Interventions for Short Term Goal #2  Educated member on interventions for rectal bleeding, when to notify MD and/or seek medical attention.     [name], [place], MSN [place] [phone]

## 2016-06-11 ENCOUNTER — Encounter: Payer: Self-pay | Admitting: *Deleted

## 2016-06-13 ENCOUNTER — Other Ambulatory Visit (HOSPITAL_BASED_OUTPATIENT_CLINIC_OR_DEPARTMENT_OTHER): Payer: Medicare Other

## 2016-06-13 ENCOUNTER — Other Ambulatory Visit: Payer: Self-pay | Admitting: Urology

## 2016-06-13 ENCOUNTER — Ambulatory Visit
Admission: RE | Admit: 2016-06-13 | Discharge: 2016-06-13 | Disposition: A | Discharge status: Home / Self Care | Payer: Medicare Other | Source: Ambulatory Visit | Attending: Radiation Oncology | Admitting: Radiation Oncology

## 2016-06-13 DIAGNOSIS — K5730 Diverticulosis of large intestine without perforation or abscess without bleeding: Secondary | ICD-10-CM | POA: Diagnosis not present

## 2016-06-13 DIAGNOSIS — Z833 Family history of diabetes mellitus: Secondary | ICD-10-CM | POA: Diagnosis not present

## 2016-06-13 DIAGNOSIS — I110 Hypertensive heart disease with heart failure: Secondary | ICD-10-CM | POA: Diagnosis not present

## 2016-06-13 DIAGNOSIS — Z809 Family history of malignant neoplasm, unspecified: Secondary | ICD-10-CM | POA: Diagnosis not present

## 2016-06-13 DIAGNOSIS — Z510 Encounter for antineoplastic radiation therapy: Secondary | ICD-10-CM | POA: Diagnosis not present

## 2016-06-13 DIAGNOSIS — C20 Malignant neoplasm of rectum: Secondary | ICD-10-CM

## 2016-06-13 DIAGNOSIS — R7303 Prediabetes: Secondary | ICD-10-CM | POA: Diagnosis not present

## 2016-06-13 DIAGNOSIS — Z87891 Personal history of nicotine dependence: Secondary | ICD-10-CM | POA: Diagnosis not present

## 2016-06-13 DIAGNOSIS — Z823 Family history of stroke: Secondary | ICD-10-CM | POA: Diagnosis not present

## 2016-06-13 DIAGNOSIS — I2510 Atherosclerotic heart disease of native coronary artery without angina pectoris: Secondary | ICD-10-CM | POA: Diagnosis not present

## 2016-06-13 DIAGNOSIS — N289 Disorder of kidney and ureter, unspecified: Secondary | ICD-10-CM

## 2016-06-13 DIAGNOSIS — N400 Enlarged prostate without lower urinary tract symptoms: Secondary | ICD-10-CM | POA: Diagnosis not present

## 2016-06-13 DIAGNOSIS — E785 Hyperlipidemia, unspecified: Secondary | ICD-10-CM | POA: Diagnosis not present

## 2016-06-13 DIAGNOSIS — I509 Heart failure, unspecified: Secondary | ICD-10-CM | POA: Diagnosis not present

## 2016-06-13 DIAGNOSIS — Z8249 Family history of ischemic heart disease and other diseases of the circulatory system: Secondary | ICD-10-CM | POA: Diagnosis not present

## 2016-06-13 DIAGNOSIS — M1990 Unspecified osteoarthritis, unspecified site: Secondary | ICD-10-CM | POA: Diagnosis not present

## 2016-06-13 DIAGNOSIS — K769 Liver disease, unspecified: Secondary | ICD-10-CM | POA: Diagnosis not present

## 2016-06-13 DIAGNOSIS — Z79899 Other long term (current) drug therapy: Secondary | ICD-10-CM | POA: Diagnosis not present

## 2016-06-13 DIAGNOSIS — Z7982 Long term (current) use of aspirin: Secondary | ICD-10-CM | POA: Diagnosis not present

## 2016-06-13 DIAGNOSIS — Z825 Family history of asthma and other chronic lower respiratory diseases: Secondary | ICD-10-CM | POA: Diagnosis not present

## 2016-06-13 LAB — CBC WITH DIFFERENTIAL/PLATELET
BASO%: 0.2 % (ref 0.0–2.0)
Basophils Absolute: 0 10*3/uL (ref 0.0–0.1)
EOS%: 1.6 % (ref 0.0–7.0)
Eosinophils Absolute: 0.1 10*3/uL (ref 0.0–0.5)
HEMATOCRIT: 28.4 % — AB (ref 38.4–49.9)
HEMOGLOBIN: 9 g/dL — AB (ref 13.0–17.1)
LYMPH%: 6.1 % — ABNORMAL LOW (ref 14.0–49.0)
MCH: 27.6 pg (ref 27.2–33.4)
MCHC: 31.7 g/dL — ABNORMAL LOW (ref 32.0–36.0)
MCV: 87.1 fL (ref 79.3–98.0)
MONO#: 0.6 10*3/uL (ref 0.1–0.9)
MONO%: 13.6 % (ref 0.0–14.0)
NEUT%: 78.5 % — AB (ref 39.0–75.0)
NEUTROS ABS: 3.3 10*3/uL (ref 1.5–6.5)
PLATELETS: 86 10*3/uL — AB (ref 140–400)
RBC: 3.26 10*6/uL — ABNORMAL LOW (ref 4.20–5.82)
RDW: 20.7 % — ABNORMAL HIGH (ref 11.0–14.6)
WBC: 4.3 10*3/uL (ref 4.0–10.3)
lymph#: 0.3 10*3/uL — ABNORMAL LOW (ref 0.9–3.3)

## 2016-06-13 LAB — COMPREHENSIVE METABOLIC PANEL
ALBUMIN: 3.4 g/dL — AB (ref 3.5–5.0)
ALK PHOS: 84 U/L (ref 40–150)
ALT: 9 U/L (ref 0–55)
AST: 15 U/L (ref 5–34)
Anion Gap: 9 mEq/L (ref 3–11)
BUN: 15.6 mg/dL (ref 7.0–26.0)
CO2: 31 mEq/L — ABNORMAL HIGH (ref 22–29)
Calcium: 9 mg/dL (ref 8.4–10.4)
Chloride: 107 mEq/L (ref 98–109)
Creatinine: 1.3 mg/dL (ref 0.7–1.3)
EGFR: 57 mL/min/{1.73_m2} — AB (ref >90)
GLUCOSE: 101 mg/dL (ref 70–140)
POTASSIUM: 4.1 meq/L (ref 3.5–5.1)
SODIUM: 147 meq/L — AB (ref 136–145)
Total Bilirubin: 1.07 mg/dL (ref 0.20–1.20)
Total Protein: 6.9 g/dL (ref 6.4–8.3)

## 2016-06-14 ENCOUNTER — Ambulatory Visit
Admission: RE | Admit: 2016-06-14 | Discharge: 2016-06-14 | Disposition: A | Discharge status: Home / Self Care | Payer: Medicare Other | Source: Ambulatory Visit | Attending: Radiation Oncology | Admitting: Radiation Oncology

## 2016-06-14 DIAGNOSIS — Z825 Family history of asthma and other chronic lower respiratory diseases: Secondary | ICD-10-CM | POA: Diagnosis not present

## 2016-06-14 DIAGNOSIS — Z7982 Long term (current) use of aspirin: Secondary | ICD-10-CM | POA: Diagnosis not present

## 2016-06-14 DIAGNOSIS — I110 Hypertensive heart disease with heart failure: Secondary | ICD-10-CM | POA: Diagnosis not present

## 2016-06-14 DIAGNOSIS — Z87891 Personal history of nicotine dependence: Secondary | ICD-10-CM | POA: Diagnosis not present

## 2016-06-14 DIAGNOSIS — Z8249 Family history of ischemic heart disease and other diseases of the circulatory system: Secondary | ICD-10-CM | POA: Diagnosis not present

## 2016-06-14 DIAGNOSIS — R7303 Prediabetes: Secondary | ICD-10-CM | POA: Diagnosis not present

## 2016-06-14 DIAGNOSIS — C20 Malignant neoplasm of rectum: Secondary | ICD-10-CM | POA: Diagnosis not present

## 2016-06-14 DIAGNOSIS — Z809 Family history of malignant neoplasm, unspecified: Secondary | ICD-10-CM | POA: Diagnosis not present

## 2016-06-14 DIAGNOSIS — Z510 Encounter for antineoplastic radiation therapy: Secondary | ICD-10-CM | POA: Diagnosis not present

## 2016-06-14 DIAGNOSIS — I509 Heart failure, unspecified: Secondary | ICD-10-CM | POA: Diagnosis not present

## 2016-06-14 DIAGNOSIS — K5730 Diverticulosis of large intestine without perforation or abscess without bleeding: Secondary | ICD-10-CM | POA: Diagnosis not present

## 2016-06-14 DIAGNOSIS — N400 Enlarged prostate without lower urinary tract symptoms: Secondary | ICD-10-CM | POA: Diagnosis not present

## 2016-06-14 DIAGNOSIS — I2510 Atherosclerotic heart disease of native coronary artery without angina pectoris: Secondary | ICD-10-CM | POA: Diagnosis not present

## 2016-06-14 DIAGNOSIS — Z833 Family history of diabetes mellitus: Secondary | ICD-10-CM | POA: Diagnosis not present

## 2016-06-14 DIAGNOSIS — E785 Hyperlipidemia, unspecified: Secondary | ICD-10-CM | POA: Diagnosis not present

## 2016-06-14 DIAGNOSIS — Z79899 Other long term (current) drug therapy: Secondary | ICD-10-CM | POA: Diagnosis not present

## 2016-06-14 DIAGNOSIS — M1990 Unspecified osteoarthritis, unspecified site: Secondary | ICD-10-CM | POA: Diagnosis not present

## 2016-06-14 DIAGNOSIS — Z823 Family history of stroke: Secondary | ICD-10-CM | POA: Diagnosis not present

## 2016-06-14 DIAGNOSIS — K769 Liver disease, unspecified: Secondary | ICD-10-CM | POA: Diagnosis not present

## 2016-06-15 ENCOUNTER — Ambulatory Visit: Payer: Medicare Other

## 2016-06-15 ENCOUNTER — Ambulatory Visit: Payer: Medicare Other | Admitting: Cardiology

## 2016-06-16 ENCOUNTER — Other Ambulatory Visit: Payer: Medicare Other

## 2016-06-16 ENCOUNTER — Ambulatory Visit: Payer: Medicare Other

## 2016-06-16 ENCOUNTER — Other Ambulatory Visit: Payer: Self-pay | Admitting: *Deleted

## 2016-06-16 ENCOUNTER — Ambulatory Visit: Payer: Medicare Other | Admitting: Nurse Practitioner

## 2016-06-16 ENCOUNTER — Telehealth: Payer: Self-pay

## 2016-06-16 NOTE — Patient Outreach (Signed)
[facility]) Care Management  06/16/2016  ENAN OUTMAN 1950/11/11 [id number]  CSW has made arrangements to meet with patient and patient's sister, Rory Swatek, with whom patient currently resides, to perform an initial home visit.  This home visit will take place on Tuesday, January 23rd at 1:00pm with patient and then at 3:00pm with Ms. Harrington Challenger, to discuss long-term care placement arrangements for patient and Ms. Causer in an assisted living facility.  Also present during the home visit will be Deloria Lair, Geriatric Nurse Practitioner with [facility], currently working with patient and Ms. Barris regarding their medical care.  After [name] spoke with patient and Ms. Reardon, both are a lot more receptive to the possibility of placement, if they are able to be placed together at the same assisted living facility.  Patient's RNCM, [name], also with [facility], is aware of the current plan of care. [name], BSW, MSW, LCSW  Licensed Education officer, environmental Health System  Mailing [place] [address], [place], [place] [address] Physical Address-[address], [place],  [address] Toll Free Main # [phone] Fax # [phone] Cell # [phone]  Fax # [phone]  [email or web address]

## 2016-06-16 NOTE — Telephone Encounter (Signed)
Called patient about appointments today d/t snow. Patient states he called and cancelled his radiation appt and didn't know they didn't cancel the other appts too. Informed pt scheduling will call with new appt date and time.

## 2016-06-17 ENCOUNTER — Encounter: Payer: Self-pay | Admitting: Radiation Oncology

## 2016-06-17 ENCOUNTER — Other Ambulatory Visit: Payer: Self-pay | Admitting: *Deleted

## 2016-06-17 ENCOUNTER — Ambulatory Visit: Payer: Medicare Other

## 2016-06-17 NOTE — Patient Outreach (Signed)
[facility]) Care Management  06/17/2016  Patrick [name] 1950-07-19 [id number]   Weekly transition of care call placed to member.  He state that he has been feeling "really tired" since yesterday.  He state that the feel a little better today but slept most of the day yesterday.  He state that he did miss his radiation treatments for the past 3 days (including today) due to the weather, but state that he Nos[place]'t think that he could've made it yesterday even if the weather was good due to him not feeling well.  He report that he will begin with his normal routine starting Monday.    He is made aware that J. Saporito, LCSW is planning to do a visit with him as well as his sister next Tuesday to discuss long term plan of care.  He state that he was unaware of the scheduled appointment, stating that it will conflict with his radiation appointment.  He is advised to contact Mrs. Saporito to discuss changing appointment time.  He is also advised to have sister (both on speaker phone to hear discussion) contact Mrs. Saporito and/or C. Spinks to schedule a time that someone would be available to open door for her visit.  They are made aware that the plan was for both LCSW and NP to discuss plan of care with the two of them together.  They verbalize understanding and will contact.    Will follow up next week.  [name], [place], MSN [place] [phone]

## 2016-06-20 ENCOUNTER — Ambulatory Visit
Admission: RE | Admit: 2016-06-20 | Discharge: 2016-06-20 | Disposition: A | Discharge status: Home / Self Care | Payer: Medicare Other | Source: Ambulatory Visit | Attending: Radiation Oncology | Admitting: Radiation Oncology

## 2016-06-20 ENCOUNTER — Other Ambulatory Visit (HOSPITAL_BASED_OUTPATIENT_CLINIC_OR_DEPARTMENT_OTHER): Payer: Medicare Other

## 2016-06-20 ENCOUNTER — Ambulatory Visit (HOSPITAL_BASED_OUTPATIENT_CLINIC_OR_DEPARTMENT_OTHER): Payer: Medicare Other | Admitting: Nurse Practitioner

## 2016-06-20 ENCOUNTER — Ambulatory Visit: Payer: Medicare Other

## 2016-06-20 ENCOUNTER — Telehealth: Payer: Self-pay | Admitting: Hematology

## 2016-06-20 VITALS — BP 127/67 | HR 53 | Temp 97.5°F | Resp 18 | Ht 69.0 in | Wt 202.0 lb

## 2016-06-20 DIAGNOSIS — E785 Hyperlipidemia, unspecified: Secondary | ICD-10-CM | POA: Diagnosis not present

## 2016-06-20 DIAGNOSIS — Z79899 Other long term (current) drug therapy: Secondary | ICD-10-CM | POA: Diagnosis not present

## 2016-06-20 DIAGNOSIS — D509 Iron deficiency anemia, unspecified: Secondary | ICD-10-CM

## 2016-06-20 DIAGNOSIS — I5022 Chronic systolic (congestive) heart failure: Secondary | ICD-10-CM

## 2016-06-20 DIAGNOSIS — R338 Other retention of urine: Secondary | ICD-10-CM | POA: Diagnosis not present

## 2016-06-20 DIAGNOSIS — I2510 Atherosclerotic heart disease of native coronary artery without angina pectoris: Secondary | ICD-10-CM | POA: Diagnosis not present

## 2016-06-20 DIAGNOSIS — Z510 Encounter for antineoplastic radiation therapy: Secondary | ICD-10-CM | POA: Diagnosis not present

## 2016-06-20 DIAGNOSIS — Z809 Family history of malignant neoplasm, unspecified: Secondary | ICD-10-CM | POA: Diagnosis not present

## 2016-06-20 DIAGNOSIS — Z8249 Family history of ischemic heart disease and other diseases of the circulatory system: Secondary | ICD-10-CM | POA: Diagnosis not present

## 2016-06-20 DIAGNOSIS — D500 Iron deficiency anemia secondary to blood loss (chronic): Secondary | ICD-10-CM

## 2016-06-20 DIAGNOSIS — I110 Hypertensive heart disease with heart failure: Secondary | ICD-10-CM | POA: Diagnosis not present

## 2016-06-20 DIAGNOSIS — Z825 Family history of asthma and other chronic lower respiratory diseases: Secondary | ICD-10-CM | POA: Diagnosis not present

## 2016-06-20 DIAGNOSIS — D6959 Other secondary thrombocytopenia: Secondary | ICD-10-CM | POA: Diagnosis not present

## 2016-06-20 DIAGNOSIS — C20 Malignant neoplasm of rectum: Secondary | ICD-10-CM

## 2016-06-20 DIAGNOSIS — K769 Liver disease, unspecified: Secondary | ICD-10-CM

## 2016-06-20 DIAGNOSIS — Z833 Family history of diabetes mellitus: Secondary | ICD-10-CM | POA: Diagnosis not present

## 2016-06-20 DIAGNOSIS — Z7982 Long term (current) use of aspirin: Secondary | ICD-10-CM | POA: Diagnosis not present

## 2016-06-20 DIAGNOSIS — Z823 Family history of stroke: Secondary | ICD-10-CM | POA: Diagnosis not present

## 2016-06-20 DIAGNOSIS — Z87891 Personal history of nicotine dependence: Secondary | ICD-10-CM | POA: Diagnosis not present

## 2016-06-20 DIAGNOSIS — K5730 Diverticulosis of large intestine without perforation or abscess without bleeding: Secondary | ICD-10-CM | POA: Diagnosis not present

## 2016-06-20 DIAGNOSIS — I10 Essential (primary) hypertension: Secondary | ICD-10-CM

## 2016-06-20 DIAGNOSIS — N400 Enlarged prostate without lower urinary tract symptoms: Secondary | ICD-10-CM | POA: Diagnosis not present

## 2016-06-20 DIAGNOSIS — R7303 Prediabetes: Secondary | ICD-10-CM | POA: Diagnosis not present

## 2016-06-20 DIAGNOSIS — M1990 Unspecified osteoarthritis, unspecified site: Secondary | ICD-10-CM | POA: Diagnosis not present

## 2016-06-20 DIAGNOSIS — I509 Heart failure, unspecified: Secondary | ICD-10-CM | POA: Diagnosis not present

## 2016-06-20 LAB — COMPREHENSIVE METABOLIC PANEL
ALBUMIN: 3.5 g/dL (ref 3.5–5.0)
ALK PHOS: 93 U/L (ref 40–150)
ALT: 7 U/L (ref 0–55)
AST: 15 U/L (ref 5–34)
Anion Gap: 10 mEq/L (ref 3–11)
BUN: 12 mg/dL (ref 7.0–26.0)
CO2: 28 mEq/L (ref 22–29)
Calcium: 9.1 mg/dL (ref 8.4–10.4)
Chloride: 104 mEq/L (ref 98–109)
Creatinine: 1.4 mg/dL — ABNORMAL HIGH (ref 0.7–1.3)
EGFR: 53 mL/min/{1.73_m2} — ABNORMAL LOW (ref >90)
Glucose: 106 mg/dl (ref 70–140)
POTASSIUM: 4 meq/L (ref 3.5–5.1)
Sodium: 141 mEq/L (ref 136–145)
Total Bilirubin: 1.28 mg/dL — ABNORMAL HIGH (ref 0.20–1.20)
Total Protein: 7.3 g/dL (ref 6.4–8.3)

## 2016-06-20 LAB — CBC WITH DIFFERENTIAL/PLATELET
BASO%: 0.7 % (ref 0.0–2.0)
Basophils Absolute: 0 10*3/uL (ref 0.0–0.1)
EOS%: 2.4 % (ref 0.0–7.0)
Eosinophils Absolute: 0.1 10*3/uL (ref 0.0–0.5)
HCT: 29.8 % — ABNORMAL LOW (ref 38.4–49.9)
HGB: 9.7 g/dL — ABNORMAL LOW (ref 13.0–17.1)
LYMPH%: 6.2 % — AB (ref 14.0–49.0)
MCH: 27.9 pg (ref 27.2–33.4)
MCHC: 32.4 g/dL (ref 32.0–36.0)
MCV: 86.1 fL (ref 79.3–98.0)
MONO#: 0.7 10*3/uL (ref 0.1–0.9)
MONO%: 13.8 % (ref 0.0–14.0)
NEUT%: 76.9 % — ABNORMAL HIGH (ref 39.0–75.0)
NEUTROS ABS: 3.7 10*3/uL (ref 1.5–6.5)
Platelets: 118 10*3/uL — ABNORMAL LOW (ref 140–400)
RBC: 3.46 10*6/uL — AB (ref 4.20–5.82)
RDW: 23.1 % — ABNORMAL HIGH (ref 11.0–14.6)
WBC: 4.9 10*3/uL (ref 4.0–10.3)
lymph#: 0.3 10*3/uL — ABNORMAL LOW (ref 0.9–3.3)

## 2016-06-20 LAB — FERRITIN: FERRITIN: 56 ng/mL (ref 22–316)

## 2016-06-20 NOTE — Telephone Encounter (Signed)
Appointments scheduled per 06/20/16 los. Patient was given a copy of the appointment schedule and AVS report, per 06/20/16 los. ° °

## 2016-06-20 NOTE — Progress Notes (Signed)
[facility] OFFICE PROGRESS NOTE   Diagnosis:  Rectal cancer Oncology History   Adenocarcinoma of rectum [facility])   Staging form: Colon and Rectum, AJCC 7th Edition   - Clinical stage from 04/01/2016: T2, N1 - Signed by Truitt Merle, MD on 05/05/2016      Adenocarcinoma of rectum ([place])   04/01/2016 Initial Diagnosis    Adenocarcinoma of rectum ([place])      04/01/2016 Procedure    Colonoscopy showed a fungating nonobstructing medium-sized mass in the distal rectum, measured 3 cm in lengths. Diverticulosis in the sigmoid colon.      04/01/2016 Initial Biopsy    Rectal mass biopsy showed adenocarcinoma, grade 2      04/04/2016 Imaging    CT chest, abdomen and pelvis with contrast showed no primary rectal carcinoma is not well visualized, 7 mm left posterior perirectal and presacral lymph nodes, indeterminate 1.4 cm low to attenuation lesion in the left hepatic lobe, question hepatic cirrhosis, recommend abdominal MRI for further evaluation. No other evidence of distant metastasis.      04/17/2016 Imaging    MR abdomen w w/o contrast IMPRESSION: 1. Morphologic features of liver compatible cirrhosis. 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 2. There is a complex lesion within the left kidney which is only partially visualized on this study which was tailored to assess the liver. When compared with study from 04/04/2016 this is favored to represent a small solid enhancing neoplasm, i.e. renal cell carcinoma. Urologic consultation is recommended. 3. The indeterminate lesion within the left lobe of liver described on recent CT remains indeterminate. This large reflects motion artifact which degrades the post-contrast sequences. There are no specific findings associated with this lesion to suggest Walker however correlation with AFP levels would be advised. Additionally, in light of the patient's increased risk for hepatoma  and liver metastases, followup imaging with repeat contrast enhanced MRI abdomen in 3-6 months is advised to ensure stability of this lesion.      05/04/2016 Imaging    MR Pelvis w w/o contrast 05/04/16 IMPRESSION: Low rectal adenocarcinoma, T2 N1 MX by imaging. Distance from inferior tumor margin to anorectal junction is <1 cm.     INTERVAL HISTORY:   Mr. Treiber returns as scheduled. He continues radiation. He missed several appointments last week due to the weather. He remains off of Xeloda. He denies rectal bleeding. No rectal pain. He had an episode of mild nausea yesterday. Bowel habits are erratic. Appetite is poor. Weight remains stable.  Objective:  Vital signs in last 24 hours:  Blood pressure 127/67, pulse (!) 53, temperature 97.5 F (36.4 C), temperature source Oral, resp. rate 18, height 5\' 9"  (1.753 m), weight 202 lb (91.6 kg), SpO2 100 %.    HEENT: No thrush or ulcers. Resp: Lungs clear bilaterally. Cardio: Regular rate and rhythm. GI: Abdomen soft and nontender. No hepatomegaly. Vascular: Trace lower leg/ankle edema bilaterally. Skin: Palms without erythema.    Lab Results:  Lab Results  Component Value Date   WBC 4.9 06/20/2016   HGB 9.7 (L) 06/20/2016   HCT 29.8 (L) 06/20/2016   MCV 86.1 06/20/2016   PLT 118 (L) 06/20/2016   NEUTROABS 3.7 06/20/2016    Imaging:  No results found.  Medications: I have reviewed the patient's current medications.  Assessment/Plan: 1. Adenocarcinoma of rectum, low anterior rectum, cT2N1M0, stage IIIA; initiation of radiation/Xeloda 05/12/2016; Xeloda placed on hold beginning 05/29/2016 due to thrombocytopenia. Platelet count improved 06/20/2016. Xeloda resumed at  a reduced dose. 2. Hypertension, chronic systolic heart failure with EF 20-25% 3. Iron deficiency anemia. Iron studies in 2016 consistent with iron deficiency. He continues oral iron. 4. Liver lesion. 1.4 cm indeterminate liver lesion in the left lobe on  staging CT scans. MRI 04/17/2016-indeterminate lesion within the left lobe of the liver. 05/19/2016 AFP in normal range. 5. Thrombocytopenia secondary to chemotherapy. Xeloda placed on hold 05/29/2016. Platelet count improved 06/20/2016. Xeloda was resumed.   Disposition: Mr. Kahele appears stable. He continues radiation. I reviewed today's labs with Dr. Burr Medico. The platelet count is better. He will resume Xeloda at a reduced dose of 1000 mg every morning and 500 mg every afternoon for the remainder of the course of radiation. He understands to contact the office with any bleeding. We will repeat a CBC in one week. He will return for a follow-up visit in 2 weeks. He will contact the office in the interim as outlined above or with any other problems.  Plan reviewed with Dr. Burr Medico.    Ned Card ANP/GNP-BC   06/20/2016  2:49 PM

## 2016-06-21 ENCOUNTER — Other Ambulatory Visit: Payer: Self-pay | Admitting: *Deleted

## 2016-06-21 ENCOUNTER — Encounter: Payer: Self-pay | Admitting: *Deleted

## 2016-06-21 ENCOUNTER — Ambulatory Visit
Admission: RE | Admit: 2016-06-21 | Discharge: 2016-06-21 | Disposition: A | Discharge status: Home / Self Care | Payer: Medicare Other | Source: Ambulatory Visit | Attending: Radiation Oncology | Admitting: Radiation Oncology

## 2016-06-21 ENCOUNTER — Ambulatory Visit: Payer: Medicare Other

## 2016-06-21 DIAGNOSIS — N400 Enlarged prostate without lower urinary tract symptoms: Secondary | ICD-10-CM | POA: Diagnosis not present

## 2016-06-21 DIAGNOSIS — C20 Malignant neoplasm of rectum: Secondary | ICD-10-CM | POA: Diagnosis not present

## 2016-06-21 DIAGNOSIS — Z87891 Personal history of nicotine dependence: Secondary | ICD-10-CM | POA: Diagnosis not present

## 2016-06-21 DIAGNOSIS — Z833 Family history of diabetes mellitus: Secondary | ICD-10-CM | POA: Diagnosis not present

## 2016-06-21 DIAGNOSIS — Z510 Encounter for antineoplastic radiation therapy: Secondary | ICD-10-CM | POA: Diagnosis not present

## 2016-06-21 DIAGNOSIS — Z809 Family history of malignant neoplasm, unspecified: Secondary | ICD-10-CM | POA: Diagnosis not present

## 2016-06-21 DIAGNOSIS — R7303 Prediabetes: Secondary | ICD-10-CM | POA: Diagnosis not present

## 2016-06-21 DIAGNOSIS — K769 Liver disease, unspecified: Secondary | ICD-10-CM | POA: Diagnosis not present

## 2016-06-21 DIAGNOSIS — E785 Hyperlipidemia, unspecified: Secondary | ICD-10-CM | POA: Diagnosis not present

## 2016-06-21 DIAGNOSIS — I110 Hypertensive heart disease with heart failure: Secondary | ICD-10-CM | POA: Diagnosis not present

## 2016-06-21 DIAGNOSIS — Z7982 Long term (current) use of aspirin: Secondary | ICD-10-CM | POA: Diagnosis not present

## 2016-06-21 DIAGNOSIS — K5730 Diverticulosis of large intestine without perforation or abscess without bleeding: Secondary | ICD-10-CM | POA: Diagnosis not present

## 2016-06-21 DIAGNOSIS — Z823 Family history of stroke: Secondary | ICD-10-CM | POA: Diagnosis not present

## 2016-06-21 DIAGNOSIS — I509 Heart failure, unspecified: Secondary | ICD-10-CM | POA: Diagnosis not present

## 2016-06-21 DIAGNOSIS — I2510 Atherosclerotic heart disease of native coronary artery without angina pectoris: Secondary | ICD-10-CM | POA: Diagnosis not present

## 2016-06-21 DIAGNOSIS — Z79899 Other long term (current) drug therapy: Secondary | ICD-10-CM | POA: Diagnosis not present

## 2016-06-21 DIAGNOSIS — Z8249 Family history of ischemic heart disease and other diseases of the circulatory system: Secondary | ICD-10-CM | POA: Diagnosis not present

## 2016-06-21 DIAGNOSIS — Z825 Family history of asthma and other chronic lower respiratory diseases: Secondary | ICD-10-CM | POA: Diagnosis not present

## 2016-06-21 DIAGNOSIS — M1990 Unspecified osteoarthritis, unspecified site: Secondary | ICD-10-CM | POA: Diagnosis not present

## 2016-06-21 NOTE — Patient Outreach (Signed)
[facility]) Care Management  06/21/2016  ADARRYLL LARRIMORE 1950/06/30 [id number]   CSW was able to meet with patient and patient's sister, Jeris Verrill today to perform the initial home visit with patient.  Also present during the visit was Deloria Lair, Geriatric Nurse Practitioner with [facility].  CSW introduced self, explained role and types of services provided through [facility] ([facility]).  CSW further explained to patient that CSW works with patient's RNCM, [name], as well as patient's Geriatric Nurse Practitioner, Deloria Lair, both with [facility]. CSW than explained the reason for the visit, indicating that Mrs. Orene Desanctis and [name] thought that patient and Ms. Cogan would benefit from social work services and resources to assist with long-term care placement arrangements in an assisted living facility.  CSW obtained two HIPAA compliant identifiers from patient, which included patient's name and date of birth. Patient appeared to be more receptive to the idea then Ms. Rohlik.  Ms. Pate was tearful throughout the entire visit, expressing her reluctance to part with all her personal belongings and independence. Ms. Palermo also admitted that she did not wish to have to give up her entire Social Security check each month and only receive a minimal allowance.  Patient and Ms. Schipani talked a lot about having worked very hard for their furnished rental house, not wanting to have to give away or get rid of items that they cherish.  CSW spoke with patient and Ms. Roat at length about being able to take a few items with them to an assisted living facility, encouraging them to go through the house and write down items that they are simply not willing to part with.  CSW and [name] talked with patient and Ms. Moring about assisting them with taking unwanted items to the dumpster, gently used items to [employer] to donate  and pricing items they are unable to take with them to sale.  Ms. [name] agreed to adopt their 48 year old cat, Joellen [place], in the event that they are unable to bring Katie with them to the assisted living facility. Another task that CSW charged patient and Ms. Fleig with was making a list of their monthly expenses, such as vehicle maintenance, repairs, gas and insurance, credit cards, etc., as patient and Ms. Reha fear that they will not have enough money to afford an assisted living facility of their choice.  Patient currently receives [employer], as well as Adult Medicaid. CSW agreed to assist patient with contacting his Medicaid Case Worker at the [facility], Mrs. Kenton Kingfisher to switch patient's Adult Medicaid to [place] Medicaid.  If necessary, CSW agreed to assist Ms. Delsol with completion of a Long-Term Care Medicaid application, as she currently only receives [employer].  Ideally, patient and Ms. Kawamura would like to be placed together in the same assisted living facility, with adjoining rooms, or even a suite, to cut costs.  CSW obtained verbal consent from patient and Ms. Bagent to request completed and signed FL-2 Forms from their Primary Care Physician, Dr. Hyman Bible so that CSW can fax to all assisted living facilities, as soon as possible.  CSW would like to have patient and Ms. Esch securely placed, prior to patient having to go into the hospital for scheduled surgery.  Patient meets with his Medical Oncologist at the [facility], Dr. Truitt Merle on Monday, January 29th, at which time, patient  has been encouraged to inquire about specific dates of his surgery and the type of procedure to be performed. CSW agreed to contact patient and Ms. Nickell as soon as bed offers are received so that CSW can schedule the next routine home visit.  In the meantime, patient and Ms. Weyand have been encouraged to contact CSW if they have question or concerns  during this entire placement process.  Ms. Gearlds admits that placement is the best option for her and patient at this time, even though she is having a very difficult time coming to terms with it.  Patient admits that he is no longer able to adequately care for patient in the home, which is a major source of concern for him.  CSW offered counseling and supportive services, where appropriate. [name], BSW, MSW, LCSW  Licensed Education officer, environmental Health System  Mailing [place] [address] [place], [place] [address] Physical Address-[address], [place], [place] [address] Toll Free Main # [phone] Fax # [phone] Cell # [phone]  Office # [phone] [email or web address]

## 2016-06-22 ENCOUNTER — Ambulatory Visit: Payer: Medicare Other

## 2016-06-22 ENCOUNTER — Ambulatory Visit
Admission: RE | Admit: 2016-06-22 | Discharge: 2016-06-22 | Disposition: A | Discharge status: Home / Self Care | Payer: Medicare Other | Source: Ambulatory Visit | Attending: Radiation Oncology | Admitting: Radiation Oncology

## 2016-06-22 DIAGNOSIS — Z833 Family history of diabetes mellitus: Secondary | ICD-10-CM | POA: Diagnosis not present

## 2016-06-22 DIAGNOSIS — Z79899 Other long term (current) drug therapy: Secondary | ICD-10-CM | POA: Diagnosis not present

## 2016-06-22 DIAGNOSIS — R7303 Prediabetes: Secondary | ICD-10-CM | POA: Diagnosis not present

## 2016-06-22 DIAGNOSIS — Z87891 Personal history of nicotine dependence: Secondary | ICD-10-CM | POA: Diagnosis not present

## 2016-06-22 DIAGNOSIS — I110 Hypertensive heart disease with heart failure: Secondary | ICD-10-CM | POA: Diagnosis not present

## 2016-06-22 DIAGNOSIS — E785 Hyperlipidemia, unspecified: Secondary | ICD-10-CM | POA: Diagnosis not present

## 2016-06-22 DIAGNOSIS — N400 Enlarged prostate without lower urinary tract symptoms: Secondary | ICD-10-CM | POA: Diagnosis not present

## 2016-06-22 DIAGNOSIS — Z7982 Long term (current) use of aspirin: Secondary | ICD-10-CM | POA: Diagnosis not present

## 2016-06-22 DIAGNOSIS — Z825 Family history of asthma and other chronic lower respiratory diseases: Secondary | ICD-10-CM | POA: Diagnosis not present

## 2016-06-22 DIAGNOSIS — K5730 Diverticulosis of large intestine without perforation or abscess without bleeding: Secondary | ICD-10-CM | POA: Diagnosis not present

## 2016-06-22 DIAGNOSIS — Z510 Encounter for antineoplastic radiation therapy: Secondary | ICD-10-CM | POA: Diagnosis not present

## 2016-06-22 DIAGNOSIS — C20 Malignant neoplasm of rectum: Secondary | ICD-10-CM | POA: Diagnosis not present

## 2016-06-22 DIAGNOSIS — K769 Liver disease, unspecified: Secondary | ICD-10-CM | POA: Diagnosis not present

## 2016-06-22 DIAGNOSIS — M1990 Unspecified osteoarthritis, unspecified site: Secondary | ICD-10-CM | POA: Diagnosis not present

## 2016-06-22 DIAGNOSIS — Z8249 Family history of ischemic heart disease and other diseases of the circulatory system: Secondary | ICD-10-CM | POA: Diagnosis not present

## 2016-06-22 DIAGNOSIS — Z823 Family history of stroke: Secondary | ICD-10-CM | POA: Diagnosis not present

## 2016-06-22 DIAGNOSIS — I2510 Atherosclerotic heart disease of native coronary artery without angina pectoris: Secondary | ICD-10-CM | POA: Diagnosis not present

## 2016-06-22 DIAGNOSIS — Z809 Family history of malignant neoplasm, unspecified: Secondary | ICD-10-CM | POA: Diagnosis not present

## 2016-06-22 DIAGNOSIS — I509 Heart failure, unspecified: Secondary | ICD-10-CM | POA: Diagnosis not present

## 2016-06-23 ENCOUNTER — Other Ambulatory Visit: Payer: Self-pay | Admitting: *Deleted

## 2016-06-23 ENCOUNTER — Ambulatory Visit: Payer: Medicare Other

## 2016-06-23 ENCOUNTER — Ambulatory Visit
Admission: RE | Admit: 2016-06-23 | Discharge: 2016-06-23 | Disposition: A | Discharge status: Home / Self Care | Payer: Medicare Other | Source: Ambulatory Visit | Attending: Radiation Oncology | Admitting: Radiation Oncology

## 2016-06-23 DIAGNOSIS — Z8249 Family history of ischemic heart disease and other diseases of the circulatory system: Secondary | ICD-10-CM | POA: Diagnosis not present

## 2016-06-23 DIAGNOSIS — Z87891 Personal history of nicotine dependence: Secondary | ICD-10-CM | POA: Diagnosis not present

## 2016-06-23 DIAGNOSIS — R8271 Bacteriuria: Secondary | ICD-10-CM | POA: Diagnosis not present

## 2016-06-23 DIAGNOSIS — E785 Hyperlipidemia, unspecified: Secondary | ICD-10-CM | POA: Diagnosis not present

## 2016-06-23 DIAGNOSIS — C20 Malignant neoplasm of rectum: Secondary | ICD-10-CM | POA: Diagnosis not present

## 2016-06-23 DIAGNOSIS — Z7982 Long term (current) use of aspirin: Secondary | ICD-10-CM | POA: Diagnosis not present

## 2016-06-23 DIAGNOSIS — Z809 Family history of malignant neoplasm, unspecified: Secondary | ICD-10-CM | POA: Diagnosis not present

## 2016-06-23 DIAGNOSIS — I509 Heart failure, unspecified: Secondary | ICD-10-CM | POA: Diagnosis not present

## 2016-06-23 DIAGNOSIS — I2510 Atherosclerotic heart disease of native coronary artery without angina pectoris: Secondary | ICD-10-CM | POA: Diagnosis not present

## 2016-06-23 DIAGNOSIS — K5730 Diverticulosis of large intestine without perforation or abscess without bleeding: Secondary | ICD-10-CM | POA: Diagnosis not present

## 2016-06-23 DIAGNOSIS — Z510 Encounter for antineoplastic radiation therapy: Secondary | ICD-10-CM | POA: Diagnosis not present

## 2016-06-23 DIAGNOSIS — M1990 Unspecified osteoarthritis, unspecified site: Secondary | ICD-10-CM | POA: Diagnosis not present

## 2016-06-23 DIAGNOSIS — N400 Enlarged prostate without lower urinary tract symptoms: Secondary | ICD-10-CM | POA: Diagnosis not present

## 2016-06-23 DIAGNOSIS — K769 Liver disease, unspecified: Secondary | ICD-10-CM | POA: Diagnosis not present

## 2016-06-23 DIAGNOSIS — Z79899 Other long term (current) drug therapy: Secondary | ICD-10-CM | POA: Diagnosis not present

## 2016-06-23 DIAGNOSIS — Z825 Family history of asthma and other chronic lower respiratory diseases: Secondary | ICD-10-CM | POA: Diagnosis not present

## 2016-06-23 DIAGNOSIS — R7303 Prediabetes: Secondary | ICD-10-CM | POA: Diagnosis not present

## 2016-06-23 DIAGNOSIS — R34 Anuria and oliguria: Secondary | ICD-10-CM | POA: Diagnosis not present

## 2016-06-23 DIAGNOSIS — Z823 Family history of stroke: Secondary | ICD-10-CM | POA: Diagnosis not present

## 2016-06-23 DIAGNOSIS — Z833 Family history of diabetes mellitus: Secondary | ICD-10-CM | POA: Diagnosis not present

## 2016-06-23 DIAGNOSIS — I110 Hypertensive heart disease with heart failure: Secondary | ICD-10-CM | POA: Diagnosis not present

## 2016-06-23 NOTE — Patient Outreach (Signed)
[facility]) Care Management  06/23/2016  Patrick [name] 09/23/1950 [id number]   Call placed to member to follow up on current health status.  He state that he has been compliant with his treatment daily without problems.  He has restarted his Xeloda, denies bleeding.  He report that he had his catheter removed Monday.  Although he state that he has been able to urinate, he has been having retention again, will see urologist today after radiation treatment is complete.  He state that he has 4 more treatments to complete and the first phase will be done.  He will then be evaluated for surgery.    He report that he was able to have a meeting with his sister and LCSW as well as his sister's assigned NP on Tuesday.  He does not provide any details but state "it went real good."  They are developing a plan of care for his sister, which will decrease the amount of stress that he is under.    He denies any questions at this time, advised to contact with concerns.  Will follow up next week.  [name], [place], MSN [place] [phone]

## 2016-06-23 NOTE — Progress Notes (Signed)
HPI The patient presents for follow-up CAD.  On cardiac cath in Sept he had subtotal occlusion of the small codominant right coronary. He had a borderline lesion in a moderate size first marginal. He had a severe cardiomyopathy with an ejection fraction of 20%. He did have mild to moderate pulmonary venous hypertension and  normal cardiac output.  His echo in Dec demonstrated his EF to be somewhat improved at 30 - 35%. He is being managed for rectal cancer.  Since I last saw him he was in the hospital with some bleeding and I reviewed these records. The blood pressure was low and he actually had his Cozaar stopped. He's had some problems with urinary retention and has had to have a Foley catheter indwelling at times but this is currently out. He takes care of his sister [place].  He has a little shortness of breath but this seems to be baseline. He's not having any PND or orthopnea.  The patient denies any new symptoms such as chest discomfort, neck or arm discomfort. There has been no new PND or orthopnea. There have been no reported palpitations, presyncope or syncope.    No Known Allergies  Current Outpatient Prescriptions  Medication Sig Dispense Refill  . acetaminophen (TYLENOL) 500 MG tablet Take 1,000 mg by mouth every 6 (six) hours as needed for mild pain.    . Ascorbic Acid (VITAMIN C) 1000 MG tablet Take 2,000 mg by mouth 2 (two) times daily.    . capecitabine (XELODA) 500 MG tablet Take 2 tablets in the morning and 1 tablets at night    . carvedilol (COREG) 25 MG tablet Take 1 tablet (25 mg total) by mouth 2 (two) times daily. 180 tablet 3  . ferrous sulfate 325 (65 FE) MG tablet take 1 tablet by mouth once daily WITH BREAKFAST 30 tablet 3  . finasteride (PROSCAR) 5 MG tablet take 1 tablet by mouth once daily 90 tablet 1  . lovastatin (MEVACOR) 20 MG tablet take 1 tablet by mouth at bedtime 90 tablet 1  . mirtazapine (REMERON SOL-TAB) 15 MG disintegrating tablet place 1 tablet ON TONGUE  at bedtime (Patient taking differently: Take 15 mg by mouth at bedtime. place 1 tablet ON TONGUE at bedtime) 90 tablet 0  . Multiple Vitamins-Minerals (MULTIVITAMIN WITH MINERALS) tablet Take 1 tablet by mouth daily.    . nitroGLYCERIN (NITROSTAT) 0.4 MG SL tablet Place 1 tablet (0.4 mg total) under the tongue every 5 (five) minutes as needed for chest pain. 25 tablet 3  . omega-3 acid ethyl esters (LOVAZA) 1 g capsule take 2 capsules by mouth twice a day 360 capsule 1  . oxyCODONE-acetaminophen (PERCOCET/ROXICET) 5-325 MG tablet Take 1 tablet by mouth every 8 (eight) hours as needed for severe pain. 90 tablet 0  . potassium chloride SA (K-DUR,KLOR-CON) 20 MEQ tablet take 3 tablets by mouth once daily 270 tablet 3  . prochlorperazine (COMPAZINE) 10 MG tablet take 1 tablet by mouth every 6 hours if needed for nausea and vomiting  0  . sulfamethoxazole-trimethoprim (BACTRIM DS,SEPTRA DS) 800-160 MG tablet Take 1 tablet by mouth 2 (two) times daily.  0  . tamsulosin (FLOMAX) 0.4 MG CAPS capsule Take 0.4 mg by mouth daily.     Marland Kitchen torsemide (DEMADEX) 20 MG tablet Take 60 mg by mouth 2 (two) times daily.    . traZODone (DESYREL) 100 MG tablet take 1 tablet by mouth twice a day 180 tablet 1  . losartan (COZAAR)  25 MG tablet Take 0.5 tablets (12.5 mg total) by mouth daily. 45 tablet 3   No current facility-administered medications for this visit.     Past Medical History:  Diagnosis Date  . Arthritis    "right hand; right knee" (06/19/2014)  . CAD (coronary artery disease)    2v CAD with subtotal occlusion of small co-dominant RCA and borderline lesion in moderate-sized OM-1  . CHF (congestive heart failure) (HCC)    Preserved EF  . Degenerative joint disease of knee, right   . Enlarged prostate   . Hyperlipidemia   . Hypertension   . Pneumonia 05/2014  . Prediabetes 10/03/2014  . Scrotal edema 04/03/2015  . SMALL BOWEL OBSTRUCTION, HX OF 08/21/2007   Annotation: with narrowing in the ileocecal  region Qualifier: Diagnosis of  By: Hassell Done FNP, Tori Milks      Past Surgical History:  Procedure Laterality Date  . CARDIAC CATHETERIZATION N/A 02/16/2016   Procedure: Right/Left Heart Cath and Coronary Angiography;  Surgeon: Jolaine Artist, MD;  Location: [facility];  Service: Cardiovascular;  Laterality: N/A;  . COLONOSCOPY N/A 04/01/2016   Procedure: COLONOSCOPY;  Surgeon: Leighton Ruff, MD;  Location: [facility];  Service: Endoscopy;  Laterality: N/A;  . INGUINAL HERNIA REPAIR Left 1990's    ROS:     As stated in the HPI and negative for all other systems.  PHYSICAL EXAM BP 123/70   Pulse 65   Ht 5\' 9"  (1.753 m)   Wt 203 lb 12.8 oz (92.4 kg)   BMI 30.10 kg/m  GENERAL:  Well appearing HEENT:  Pupils equal round and reactive, fundi not visualized, oral mucosa unremarkable, poor dentition.  NECK:  Positive jugular venous distention to the jaw at 45 degrees, waveform within normal limits, carotid upstroke brisk and symmetric, no bruits, no thyromegaly LUNGS:  Clear to auscultation bilaterally BACK:  No CVA tenderness CHEST:  Unremarkable HEART:  PMI not displaced or sustained,S1 within normal limits, fixed split S2, no S3, no S4, no clicks, no rubs, no murmurs ABD:  Flat, positive bowel sounds normal in frequency in pitch, no bruits, no rebound, no guarding, no midline pulsatile mass, no hepatomegaly, no splenomegaly EXT:  2 plus pulses throughout, moderate edema bilateral legs, no cyanosis no clubbing   EKG:  Not done today.    ASSESSMENT AND PLAN  CAD:   We will continue to manage this medically.   He has no new symptoms.    Mild renal insufficiency/hypokalemia:    She had a normal creat three days ago.  We will follow this as below  CM:  Probably predominantly nonischemic.  His blood pressures rebounded. I would like to try to restart his Cozaar and I'll start at 12.5 mg daily watching his blood pressure and he will get a basic metabolic profile in 2 weeks.  He'll come back in one month to see if we can further titrate.  He seems to be euvolemic and he says he's had decreased urine output but this point I Olan't think he needs a higher dose of diuretic. We talked about when necessary dosing.  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  No change in therapy is indicated.

## 2016-06-24 ENCOUNTER — Encounter: Payer: Self-pay | Admitting: Radiation Oncology

## 2016-06-24 ENCOUNTER — Ambulatory Visit
Admission: RE | Admit: 2016-06-24 | Discharge: 2016-06-24 | Disposition: A | Discharge status: Home / Self Care | Payer: Medicare Other | Source: Ambulatory Visit | Attending: Radiation Oncology | Admitting: Radiation Oncology

## 2016-06-24 ENCOUNTER — Ambulatory Visit: Payer: Medicare Other

## 2016-06-24 ENCOUNTER — Other Ambulatory Visit: Payer: Medicare Other

## 2016-06-24 ENCOUNTER — Ambulatory Visit (INDEPENDENT_AMBULATORY_CARE_PROVIDER_SITE_OTHER): Payer: Medicare Other | Admitting: Cardiology

## 2016-06-24 ENCOUNTER — Ambulatory Visit: Payer: Medicare Other | Admitting: Nurse Practitioner

## 2016-06-24 ENCOUNTER — Ambulatory Visit: Payer: Medicare Other | Admitting: Cardiology

## 2016-06-24 VITALS — BP 123/70 | HR 65 | Ht 69.0 in | Wt 203.8 lb

## 2016-06-24 VITALS — BP 138/85 | HR 57 | Temp 97.7°F | Resp 18 | Wt 200.4 lb

## 2016-06-24 DIAGNOSIS — E785 Hyperlipidemia, unspecified: Secondary | ICD-10-CM | POA: Diagnosis not present

## 2016-06-24 DIAGNOSIS — I255 Ischemic cardiomyopathy: Secondary | ICD-10-CM

## 2016-06-24 DIAGNOSIS — I509 Heart failure, unspecified: Secondary | ICD-10-CM | POA: Diagnosis not present

## 2016-06-24 DIAGNOSIS — Z8249 Family history of ischemic heart disease and other diseases of the circulatory system: Secondary | ICD-10-CM | POA: Diagnosis not present

## 2016-06-24 DIAGNOSIS — K5730 Diverticulosis of large intestine without perforation or abscess without bleeding: Secondary | ICD-10-CM | POA: Diagnosis not present

## 2016-06-24 DIAGNOSIS — Z833 Family history of diabetes mellitus: Secondary | ICD-10-CM | POA: Diagnosis not present

## 2016-06-24 DIAGNOSIS — I110 Hypertensive heart disease with heart failure: Secondary | ICD-10-CM | POA: Diagnosis not present

## 2016-06-24 DIAGNOSIS — C20 Malignant neoplasm of rectum: Secondary | ICD-10-CM

## 2016-06-24 DIAGNOSIS — Z79899 Other long term (current) drug therapy: Secondary | ICD-10-CM | POA: Diagnosis not present

## 2016-06-24 DIAGNOSIS — I2510 Atherosclerotic heart disease of native coronary artery without angina pectoris: Secondary | ICD-10-CM | POA: Diagnosis not present

## 2016-06-24 DIAGNOSIS — R7303 Prediabetes: Secondary | ICD-10-CM | POA: Diagnosis not present

## 2016-06-24 DIAGNOSIS — K769 Liver disease, unspecified: Secondary | ICD-10-CM | POA: Diagnosis not present

## 2016-06-24 DIAGNOSIS — Z825 Family history of asthma and other chronic lower respiratory diseases: Secondary | ICD-10-CM | POA: Diagnosis not present

## 2016-06-24 DIAGNOSIS — N400 Enlarged prostate without lower urinary tract symptoms: Secondary | ICD-10-CM | POA: Diagnosis not present

## 2016-06-24 DIAGNOSIS — Z510 Encounter for antineoplastic radiation therapy: Secondary | ICD-10-CM | POA: Diagnosis not present

## 2016-06-24 DIAGNOSIS — Z7982 Long term (current) use of aspirin: Secondary | ICD-10-CM | POA: Diagnosis not present

## 2016-06-24 DIAGNOSIS — M1990 Unspecified osteoarthritis, unspecified site: Secondary | ICD-10-CM | POA: Diagnosis not present

## 2016-06-24 DIAGNOSIS — Z87891 Personal history of nicotine dependence: Secondary | ICD-10-CM | POA: Diagnosis not present

## 2016-06-24 DIAGNOSIS — Z809 Family history of malignant neoplasm, unspecified: Secondary | ICD-10-CM | POA: Diagnosis not present

## 2016-06-24 DIAGNOSIS — Z823 Family history of stroke: Secondary | ICD-10-CM | POA: Diagnosis not present

## 2016-06-24 MED ORDER — LOSARTAN POTASSIUM 25 MG PO TABS: 12.5000 mg | ORAL_TABLET | Freq: Every day | ORAL | 3 refills | Status: DC

## 2016-06-24 MED FILL — LOSARTAN POTASSIUM 25 MG TA: 25 | 30 days supply | Qty: 45 | Fill #0 | Status: TO

## 2016-06-24 NOTE — Progress Notes (Signed)
Weekly rad txs rectum 26/28 completed, still has dysuria, started on new antibiotic rx fro [facility]  Yesterday,  Jeanelle Malling is a little bit better stated, bowels are better, using sitz bath, baby wipes, no skin breakdown, , appetite good,  Follow up appt with Shona Simpson on 07/28/2016 BP 138/85 (BP Location: Left Arm, Patient Position: Sitting, Cuff Size: Normal)   Pulse (!) 57   Temp 97.7 F (36.5 C) (Oral)   Resp 18   Wt 200 lb 6.4 oz (90.9 kg)   BMI 29.59 kg/m   Wt Readings from Last 3 Encounters:  06/24/16 200 lb 6.4 oz (90.9 kg)  06/20/16 202 lb (91.6 kg)  06/10/16 201 lb 12.8 oz (91.5 kg)

## 2016-06-24 NOTE — Progress Notes (Signed)
Department of Radiation Oncology  Phone:  [phone] Fax:        [phone]  Weekly Treatment Note    Name: Patrick [name] Date: 06/24/2016 MRN: [id number] DOB: 24-May-1951   Diagnosis:     ICD-9-CM ICD-10-CM   1. 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 acetaminophen (TYLENOL) 500 MG tablet Take 1,000 mg by mouth every 6 (six) hours as needed for mild pain.    . Ascorbic Acid (VITAMIN C) 1000 MG tablet Take 2,000 mg by mouth 2 (two) times daily.    Marland Kitchen aspirin EC 81 MG EC tablet Take 1 tablet (81 mg total) by mouth daily. (Patient not taking: Reported on 06/10/2016) 30 tablet 0  . capecitabine (XELODA) 500 MG tablet Take #4 tab in morning and #3 tab in evening on days of radiation-Monday-Friday (Patient not taking: Reported on 06/10/2016) 210 tablet 0  . carvedilol (COREG) 25 MG tablet Take 1 tablet (25 mg total) by mouth 2 (two) times daily. 180 tablet 3  . ferrous sulfate 325 (65 FE) MG tablet take 1 tablet by mouth once daily WITH BREAKFAST 30 tablet 3  . finasteride (PROSCAR) 5 MG tablet take 1 tablet by mouth once daily 90 tablet 1  . lovastatin (MEVACOR) 20 MG tablet take 1 tablet by mouth at bedtime 90 tablet 1  . mirtazapine (REMERON SOL-TAB) 15 MG disintegrating tablet place 1 tablet ON TONGUE at bedtime (Patient taking differently: Take 15 mg by mouth at bedtime. place 1 tablet ON TONGUE at bedtime) 90 tablet 0  . Multiple Vitamins-Minerals (MULTIVITAMIN WITH MINERALS) tablet Take 1 tablet by mouth daily.    . nitroGLYCERIN (NITROSTAT) 0.4 MG SL tablet Place 1 tablet (0.4 mg total) under the tongue every 5 (five) minutes as needed for chest pain. (Patient not taking: Reported on 06/20/2016) 25 tablet 3  . omega-3 acid ethyl esters (LOVAZA) 1 g capsule take 2 capsules by mouth twice a day 360 capsule 1  . oxyCODONE-acetaminophen  (PERCOCET/ROXICET) 5-325 MG tablet Take 1 tablet by mouth every 8 (eight) hours as needed for severe pain. 90 tablet 0  . potassium chloride SA (K-DUR,KLOR-CON) 20 MEQ tablet take 3 tablets by mouth once daily 270 tablet 3  . prochlorperazine (COMPAZINE) 10 MG tablet take 1 tablet by mouth every 6 hours if needed for nausea and vomiting  0  . tamsulosin (FLOMAX) 0.4 MG CAPS capsule Take 0.4 mg by mouth daily.     Marland Kitchen torsemide (DEMADEX) 20 MG tablet take 4 tablets by mouth twice a day (Patient taking differently: take 3 tablets by mouth twice a day) 720 tablet 0  . traZODone (DESYREL) 100 MG tablet take 1 tablet by mouth twice a day 180 tablet 1   No current facility-administered medications for this encounter.      ALLERGIES: Patient has no known allergies.   LABORATORY DATA:  Lab Results  Component Value Date   WBC 4.9 06/20/2016   HGB 9.7 (L) 06/20/2016   HCT 29.8 (L) 06/20/2016   MCV 86.1 06/20/2016   PLT 118 (L) 06/20/2016   Lab Results  Component Value Date   NA 141 06/20/2016   K 4.0 06/20/2016   CL 106 06/04/2016   CO2 28 06/20/2016   Lab Results  Component Value Date   ALT 7 06/20/2016   AST 15 06/20/2016   ALKPHOS  93 06/20/2016   BILITOT 1.28 (H) 06/20/2016     NARRATIVE: Patrick [name] was seen today for weekly treatment management. The chart was checked and the patient's films were reviewed.  The patient continues with radiation treatment this week. Weakness several treatments last week due to the weather. No unexpected issues at this point. Patient tolerating treatment well overall.  PHYSICAL EXAMINATION: vitals were not taken for this visit.     Alert, no acute distress  ASSESSMENT: The patient is doing satisfactorily with treatment.  PLAN: We will continue with the patient's radiation treatment as planned.

## 2016-06-24 NOTE — Progress Notes (Signed)
Department of Radiation Oncology  Phone:  [phone] Fax:        [phone]  Weekly Treatment Note    Name: Patrick [name] Date: 06/24/2016 MRN: [id number] DOB: 1950/06/21   Diagnosis:     ICD-9-CM ICD-10-CM   1. 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 acetaminophen (TYLENOL) 500 MG tablet Take 1,000 mg by mouth every 6 (six) hours as needed for mild pain.    . Ascorbic Acid (VITAMIN C) 1000 MG tablet Take 2,000 mg by mouth 2 (two) times daily.    . carvedilol (COREG) 25 MG tablet Take 1 tablet (25 mg total) by mouth 2 (two) times daily. 180 tablet 3  . ferrous sulfate 325 (65 FE) MG tablet take 1 tablet by mouth once daily WITH BREAKFAST 30 tablet 3  . finasteride (PROSCAR) 5 MG tablet take 1 tablet by mouth once daily 90 tablet 1  . lovastatin (MEVACOR) 20 MG tablet take 1 tablet by mouth at bedtime 90 tablet 1  . mirtazapine (REMERON SOL-TAB) 15 MG disintegrating tablet place 1 tablet ON TONGUE at bedtime (Patient taking differently: Take 15 mg by mouth at bedtime. place 1 tablet ON TONGUE at bedtime) 90 tablet 0  . Multiple Vitamins-Minerals (MULTIVITAMIN WITH MINERALS) tablet Take 1 tablet by mouth daily.    Marland Kitchen omega-3 acid ethyl esters (LOVAZA) 1 g capsule take 2 capsules by mouth twice a day 360 capsule 1  . oxyCODONE-acetaminophen (PERCOCET/ROXICET) 5-325 MG tablet Take 1 tablet by mouth every 8 (eight) hours as needed for severe pain. 90 tablet 0  . potassium chloride SA (K-DUR,KLOR-CON) 20 MEQ tablet take 3 tablets by mouth once daily 270 tablet 3  . prochlorperazine (COMPAZINE) 10 MG tablet take 1 tablet by mouth every 6 hours if needed for nausea and vomiting  0  . tamsulosin (FLOMAX) 0.4 MG CAPS capsule Take 0.4 mg by mouth daily.     Marland Kitchen torsemide (DEMADEX) 20 MG tablet take 4 tablets by mouth twice a day (Patient taking differently:  take 3 tablets by mouth twice a day) 720 tablet 0  . traZODone (DESYREL) 100 MG tablet take 1 tablet by mouth twice a day 180 tablet 1  . aspirin EC 81 MG EC tablet Take 1 tablet (81 mg total) by mouth daily. (Patient not taking: Reported on 06/10/2016) 30 tablet 0  . capecitabine (XELODA) 500 MG tablet Take #4 tab in morning and #3 tab in evening on days of radiation-Monday-Friday (Patient not taking: Reported on 06/10/2016) 210 tablet 0  . nitroGLYCERIN (NITROSTAT) 0.4 MG SL tablet Place 1 tablet (0.4 mg total) under the tongue every 5 (five) minutes as needed for chest pain. (Patient not taking: Reported on 06/20/2016) 25 tablet 3   No current facility-administered medications for this encounter.      ALLERGIES: Patient has no known allergies.   LABORATORY DATA:  Lab Results  Component Value Date   WBC 4.9 06/20/2016   HGB 9.7 (L) 06/20/2016   HCT 29.8 (L) 06/20/2016   MCV 86.1 06/20/2016   PLT 118 (L) 06/20/2016   Lab Results  Component Value Date   NA 141 06/20/2016   K 4.0 06/20/2016   CL 106 06/04/2016   CO2 28 06/20/2016   Lab Results  Component Value Date   ALT 7 06/20/2016   AST 15 06/20/2016   ALKPHOS  93 06/20/2016   BILITOT 1.28 (H) 06/20/2016     NARRATIVE: Patrick [name] was seen today for weekly treatment management. The chart was checked and the patient's films were reviewed.  The patient states that he is doing well this week. He has 2 more fractions and will finish next Tuesday. He has been given an appointment to come back and see [place] in our clinic in early March. He indicates that his skin is doing quite well in the treatment area.  PHYSICAL EXAMINATION: weight is 200 lb 6.4 oz (90.9 kg). His oral temperature is 97.7 F (36.5 C). His blood pressure is 138/85 and his pulse is 57 (abnormal). His respiration is 18.        ASSESSMENT: The patient is doing satisfactorily with treatment.  PLAN: We will continue with the patient's radiation treatment as planned.

## 2016-06-24 NOTE — Progress Notes (Signed)
  Radiation Oncology         [phone] ________________________________  Name: Patrick [name] MRN: [id number]  Date: 06/08/2016  DOB: January 27, 1951  COMPLEX SIMULATION  NOTE  Diagnosis: rectal cancer  Narrative The patient has initially been planned to receive a course of radiation treatment to a dose of 45 Gy in 25 fractions at 1.8 gray per fraction. The patient will now receive a boost to the high risk target volume for an additional 5.4 Gy. This will be delivered in 3 fractions at 1.8 Gy per fraction and a cone down boost technique will be utilized. To accomplish this, an additional 4 customized blocks have been designed for this purpose. A complex isodose plan is requested to ensure that the high-risk target region receives the appropriate radiation dose and that the nearby normal structures continue to be appropriately spared. The patient's final total dose therefore will be 50.4 Gy.   ________________________________ ------------------------------------------------  Jodelle Gross, MD, PhD

## 2016-06-24 NOTE — Patient Instructions (Addendum)
Medication Instructions:  START- Losartan 25 mg take 1/2 tablets(12.5 mg) daily  Labwork: BMP in 2 weeks  Testing/Procedures: None Ordered  Follow-Up: Your physician recommends that you schedule a follow-up appointment in: 1 Month with Suanne Marker or Lurena Joiner   Any Other Special Instructions Will Be Listed Below (If Applicable).   If you need a refill on your cardiac medications before your next appointment, please call your pharmacy.

## 2016-06-25 ENCOUNTER — Encounter: Payer: Self-pay | Admitting: Cardiology

## 2016-06-27 ENCOUNTER — Ambulatory Visit
Admission: RE | Admit: 2016-06-27 | Discharge: 2016-06-27 | Disposition: A | Discharge status: Home / Self Care | Payer: Medicare Other | Source: Ambulatory Visit | Attending: Radiation Oncology | Admitting: Radiation Oncology

## 2016-06-27 ENCOUNTER — Other Ambulatory Visit (HOSPITAL_BASED_OUTPATIENT_CLINIC_OR_DEPARTMENT_OTHER): Payer: Medicare Other

## 2016-06-27 ENCOUNTER — Ambulatory Visit: Payer: Medicare Other

## 2016-06-27 DIAGNOSIS — Z79899 Other long term (current) drug therapy: Secondary | ICD-10-CM | POA: Diagnosis not present

## 2016-06-27 DIAGNOSIS — Z8249 Family history of ischemic heart disease and other diseases of the circulatory system: Secondary | ICD-10-CM | POA: Diagnosis not present

## 2016-06-27 DIAGNOSIS — I110 Hypertensive heart disease with heart failure: Secondary | ICD-10-CM | POA: Diagnosis not present

## 2016-06-27 DIAGNOSIS — I2510 Atherosclerotic heart disease of native coronary artery without angina pectoris: Secondary | ICD-10-CM | POA: Diagnosis not present

## 2016-06-27 DIAGNOSIS — K5730 Diverticulosis of large intestine without perforation or abscess without bleeding: Secondary | ICD-10-CM | POA: Diagnosis not present

## 2016-06-27 DIAGNOSIS — E785 Hyperlipidemia, unspecified: Secondary | ICD-10-CM | POA: Diagnosis not present

## 2016-06-27 DIAGNOSIS — Z823 Family history of stroke: Secondary | ICD-10-CM | POA: Diagnosis not present

## 2016-06-27 DIAGNOSIS — C20 Malignant neoplasm of rectum: Secondary | ICD-10-CM

## 2016-06-27 DIAGNOSIS — K769 Liver disease, unspecified: Secondary | ICD-10-CM | POA: Diagnosis not present

## 2016-06-27 DIAGNOSIS — I509 Heart failure, unspecified: Secondary | ICD-10-CM | POA: Diagnosis not present

## 2016-06-27 DIAGNOSIS — N400 Enlarged prostate without lower urinary tract symptoms: Secondary | ICD-10-CM | POA: Diagnosis not present

## 2016-06-27 DIAGNOSIS — Z510 Encounter for antineoplastic radiation therapy: Secondary | ICD-10-CM | POA: Diagnosis not present

## 2016-06-27 DIAGNOSIS — Z809 Family history of malignant neoplasm, unspecified: Secondary | ICD-10-CM | POA: Diagnosis not present

## 2016-06-27 DIAGNOSIS — Z825 Family history of asthma and other chronic lower respiratory diseases: Secondary | ICD-10-CM | POA: Diagnosis not present

## 2016-06-27 DIAGNOSIS — Z833 Family history of diabetes mellitus: Secondary | ICD-10-CM | POA: Diagnosis not present

## 2016-06-27 DIAGNOSIS — Z87891 Personal history of nicotine dependence: Secondary | ICD-10-CM | POA: Diagnosis not present

## 2016-06-27 DIAGNOSIS — M1990 Unspecified osteoarthritis, unspecified site: Secondary | ICD-10-CM | POA: Diagnosis not present

## 2016-06-27 DIAGNOSIS — R7303 Prediabetes: Secondary | ICD-10-CM | POA: Diagnosis not present

## 2016-06-27 DIAGNOSIS — Z7982 Long term (current) use of aspirin: Secondary | ICD-10-CM | POA: Diagnosis not present

## 2016-06-27 LAB — CBC WITH DIFFERENTIAL/PLATELET
BASO%: 0.2 % (ref 0.0–2.0)
BASOS ABS: 0 10*3/uL (ref 0.0–0.1)
EOS ABS: 0.1 10*3/uL (ref 0.0–0.5)
EOS%: 1.5 % (ref 0.0–7.0)
HEMATOCRIT: 30.8 % — AB (ref 38.4–49.9)
HGB: 9.6 g/dL — ABNORMAL LOW (ref 13.0–17.1)
LYMPH#: 0.5 10*3/uL — AB (ref 0.9–3.3)
LYMPH%: 11.6 % — ABNORMAL LOW (ref 14.0–49.0)
MCH: 27.7 pg (ref 27.2–33.4)
MCHC: 31.2 g/dL — AB (ref 32.0–36.0)
MCV: 88.8 fL (ref 79.3–98.0)
MONO#: 0.3 10*3/uL (ref 0.1–0.9)
MONO%: 7.6 % (ref 0.0–14.0)
NEUT#: 3.2 10*3/uL (ref 1.5–6.5)
NEUT%: 79.1 % — AB (ref 39.0–75.0)
Platelets: 73 10*3/uL — ABNORMAL LOW (ref 140–400)
RBC: 3.47 10*6/uL — ABNORMAL LOW (ref 4.20–5.82)
RDW: 19.9 % — ABNORMAL HIGH (ref 11.0–14.6)
WBC: 4.1 10*3/uL (ref 4.0–10.3)

## 2016-06-28 ENCOUNTER — Telehealth: Payer: Self-pay | Admitting: *Deleted

## 2016-06-28 ENCOUNTER — Other Ambulatory Visit: Payer: Self-pay | Admitting: *Deleted

## 2016-06-28 ENCOUNTER — Ambulatory Visit
Admission: RE | Admit: 2016-06-28 | Discharge: 2016-06-28 | Disposition: A | Discharge status: Home / Self Care | Payer: Medicare Other | Source: Ambulatory Visit | Attending: Radiation Oncology | Admitting: Radiation Oncology

## 2016-06-28 DIAGNOSIS — M1990 Unspecified osteoarthritis, unspecified site: Secondary | ICD-10-CM | POA: Diagnosis not present

## 2016-06-28 DIAGNOSIS — K5730 Diverticulosis of large intestine without perforation or abscess without bleeding: Secondary | ICD-10-CM | POA: Diagnosis not present

## 2016-06-28 DIAGNOSIS — I2510 Atherosclerotic heart disease of native coronary artery without angina pectoris: Secondary | ICD-10-CM | POA: Diagnosis not present

## 2016-06-28 DIAGNOSIS — R7303 Prediabetes: Secondary | ICD-10-CM | POA: Diagnosis not present

## 2016-06-28 DIAGNOSIS — K769 Liver disease, unspecified: Secondary | ICD-10-CM | POA: Diagnosis not present

## 2016-06-28 DIAGNOSIS — C20 Malignant neoplasm of rectum: Secondary | ICD-10-CM | POA: Diagnosis not present

## 2016-06-28 DIAGNOSIS — Z510 Encounter for antineoplastic radiation therapy: Secondary | ICD-10-CM | POA: Diagnosis not present

## 2016-06-28 DIAGNOSIS — Z7982 Long term (current) use of aspirin: Secondary | ICD-10-CM | POA: Diagnosis not present

## 2016-06-28 DIAGNOSIS — Z825 Family history of asthma and other chronic lower respiratory diseases: Secondary | ICD-10-CM | POA: Diagnosis not present

## 2016-06-28 DIAGNOSIS — Z8249 Family history of ischemic heart disease and other diseases of the circulatory system: Secondary | ICD-10-CM | POA: Diagnosis not present

## 2016-06-28 DIAGNOSIS — Z79899 Other long term (current) drug therapy: Secondary | ICD-10-CM | POA: Diagnosis not present

## 2016-06-28 DIAGNOSIS — Z833 Family history of diabetes mellitus: Secondary | ICD-10-CM | POA: Diagnosis not present

## 2016-06-28 DIAGNOSIS — Z823 Family history of stroke: Secondary | ICD-10-CM | POA: Diagnosis not present

## 2016-06-28 DIAGNOSIS — I509 Heart failure, unspecified: Secondary | ICD-10-CM | POA: Diagnosis not present

## 2016-06-28 DIAGNOSIS — Z87891 Personal history of nicotine dependence: Secondary | ICD-10-CM | POA: Diagnosis not present

## 2016-06-28 DIAGNOSIS — I110 Hypertensive heart disease with heart failure: Secondary | ICD-10-CM | POA: Diagnosis not present

## 2016-06-28 DIAGNOSIS — Z809 Family history of malignant neoplasm, unspecified: Secondary | ICD-10-CM | POA: Diagnosis not present

## 2016-06-28 DIAGNOSIS — E785 Hyperlipidemia, unspecified: Secondary | ICD-10-CM | POA: Diagnosis not present

## 2016-06-28 DIAGNOSIS — N400 Enlarged prostate without lower urinary tract symptoms: Secondary | ICD-10-CM | POA: Diagnosis not present

## 2016-06-28 NOTE — Telephone Encounter (Signed)
CALLED PATIENT TO RESCHEDULE FU ON 07-28-16 , PT. DECLINED APPT. ON 07-28-16 , APPT. SCHEDULED FOR 08-04-16 PER PATIENT REQUEST

## 2016-06-28 NOTE — Patient Outreach (Signed)
[facility]) Care Management  06/28/2016  Patrick [name] 1950/10/19 [id number]   CSW was able to make contact with patient today to follow-up with him regarding long-term care placement arrangements in an assisted living facility, as well as congratulate him on his final radiation treatment, which took place today.  Patient was most appreciative of the call and so happy to have his radiation treatment course completed.  Patient admits that he is still not scheduled for surgery, nor does he know the name and type of procedure he will receive. Patient voiced a great deal of concern regarding long-term care placement arrangements for he and his sister, [name], reporting "There has got to be somewhere that people can go in between home and long-term care".  CSW spoke with patient about respite care for [name], just while patient is hospitalized and receiving short-term rehabilitative services, as [name] will not be able to care for herself independently, solely relying on maximum assistance from patient.  Patient agreed that he and [name] may actually be more agreeable to this plan of care, as patient, nor [name] are ready to leave their home and receive long-term care placement of any kind.  Patient reported, "I just need help for her while I am regaining my strength so that I will be able to care for her again".  Patient went on to say that it would "kill" [name] if she had to give up her 33 year old cat, [name]   Patient agreed to find out when his surgery is scheduled and report findings to CSW.  CSW explained to patient that CSW had delivered two blank FL-2 Forms to patient's Primary Care Physician, Dr. Valetta Fuller Mayo's office today and that once the forms are completed and signed, CSW will proceed with faxing them out to all facilities of choice to pursue bed offers.  Patient voiced understanding and was agreeable to this plan.  CSW will contact patient in one week, unless CSW has  news to report in the meantime. [name], BSW, MSW, LCSW  Licensed Education officer, [employer]  Mailing [place] [address] [place], [place] [address] Physical Address-[address], [place], [place] [address] Toll Free Main # [phone] Fax # [phone] Cell # [phone]  Office # [phone] [email or web address]

## 2016-06-29 ENCOUNTER — Ambulatory Visit: Payer: Medicaid Other | Admitting: Podiatry

## 2016-06-29 ENCOUNTER — Other Ambulatory Visit: Payer: Self-pay | Admitting: *Deleted

## 2016-06-29 ENCOUNTER — Encounter: Payer: Self-pay | Admitting: *Deleted

## 2016-06-29 ENCOUNTER — Telehealth: Payer: Self-pay | Admitting: Cardiology

## 2016-06-29 NOTE — Telephone Encounter (Signed)
New Message  Pt c/o medication issue:  1. Name of Medication: losartan (Cozaar) 25 mg tablet 12.5 mg tablet once daily  2. How are you currently taking this medication (dosage and times per day)? See above  3. Are you having a reaction (difficulty breathing--STAT)? N/A  4. What is your medication issue? Pt voiced wanting to know if he can take lasartan every other day with pill being extremely small. When he tries to break pill it half it shoots across room.  Please f/u with pt

## 2016-06-29 NOTE — Patient Outreach (Signed)
[facility]) Care Management   06/29/2016  Patrick [name] 1950-08-21 [id number]  Patrick [name] is an 66 y.o. male  Subjective: INITIAL HOME VISIT: Pt has been involved telephonically with [facility] and has been initially involved with Patrick David, RN, Care Manager and [employer], [place]. 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 He has his last radiation tx yesterday. He will see his oncologist, Dr. Burr Medico, on Monday and his plan of care will be discussed and decided. Pt has been told he will need surgery.  Objective:   Review of Systems  Constitutional: Positive for malaise/fatigue.  HENT: Negative.   Eyes: Negative.   Respiratory: Positive for shortness of breath.        Upon exertion.  Cardiovascular: Positive for palpitations.       Only on exertion.  Gastrointestinal: Positive for blood in stool and constipation.       Pt is unsure if what he saw was blood or not but no quantity or reoccurance.  Genitourinary:       Hesitancy and stream is like a "spray"  Musculoskeletal: Positive for joint pain.       Right Hip today.  Skin: Negative.   Neurological: Positive for weakness.  Endo/Heme/Allergies: Bruises/bleeds easily.  Psychiatric/Behavioral: Positive for depression. The patient is nervous/anxious and has insomnia.    BP (!) 100/50 (BP Location: Left Arm, Patient Position: Sitting, Cuff Size: Normal)   Pulse (!) 48   Resp 16   Ht 1.753 m (5\' 9" )   Wt 202 lb (91.6 kg)   SpO2 98%   BMI 29.83 kg/m   Physical Exam  Constitutional: He is oriented to person, place, and time. He appears well-developed and well-nourished.  Neck: Normal range of motion.  Cardiovascular: Normal rate, regular rhythm and normal heart sounds.   Respiratory: Effort normal and breath sounds normal.  GI: Soft. Bowel sounds are normal.  Musculoskeletal: Normal range of motion.  Neurological: He is alert and oriented to person, place, and  time.  Skin: Skin is warm and dry.  Psychiatric:  Admits to depression.    Encounter Medications:   Outpatient Encounter Prescriptions as of 06/29/2016  Medication Sig Note  . acetaminophen (TYLENOL) 500 MG tablet Take 1,000 mg by mouth every 6 (six) hours as needed for mild pain.   . Ascorbic Acid (VITAMIN C) 1000 MG tablet Take 2,000 mg by mouth 2 (two) times daily.   . carvedilol (COREG) 25 MG tablet Take 1 tablet (25 mg total) by mouth 2 (two) times daily.   . ferrous sulfate 325 (65 FE) MG tablet take 1 tablet by mouth once daily WITH BREAKFAST   . finasteride (PROSCAR) 5 MG tablet take 1 tablet by mouth once daily   . losartan (COZAAR) 25 MG tablet Take 0.5 tablets (12.5 mg total) by mouth daily. 06/29/2016: Pt having difficulty breaking this tablet. Advised him to request pharmacy to cut.  . lovastatin (MEVACOR) 20 MG tablet take 1 tablet by mouth at bedtime   . mirtazapine (REMERON SOL-TAB) 15 MG disintegrating tablet place 1 tablet ON TONGUE at bedtime (Patient taking differently: Take 15 mg by mouth at bedtime. place 1 tablet ON TONGUE at bedtime)   . Multiple Vitamins-Minerals (MULTIVITAMIN WITH MINERALS) tablet Take 1 tablet by mouth daily.   . nitroGLYCERIN (NITROSTAT) 0.4 MG SL tablet Place 1 tablet (0.4 mg total) under the tongue every 5 (five) minutes as needed for chest pain.   Marland Kitchen  omega-3 acid ethyl esters (LOVAZA) 1 g capsule take 2 capsules by mouth twice a day   . potassium chloride SA (K-DUR,KLOR-CON) 20 MEQ tablet take 3 tablets by mouth once daily   . sulfamethoxazole-trimethoprim (BACTRIM DS,SEPTRA DS) 800-160 MG tablet Take 1 tablet by mouth 2 (two) times daily. 06/29/2016: 2 tablets left.  . tamsulosin (FLOMAX) 0.4 MG CAPS capsule Take 0.4 mg by mouth daily.    Marland Kitchen torsemide (DEMADEX) 20 MG tablet Take 60 mg by mouth 2 (two) times daily.   . traZODone (DESYREL) 100 MG tablet take 1 tablet by mouth twice a day   . capecitabine (XELODA) 500 MG tablet Take 2 tablets in the  morning and 1 tablets at night 06/29/2016: Took last dose 06/28/16.  Marland Kitchen oxyCODONE-acetaminophen (PERCOCET/ROXICET) 5-325 MG tablet Take 1 tablet by mouth every 8 (eight) hours as needed for severe pain. (Patient not taking: Reported on 06/29/2016) 06/29/2016: Takes rarely.  . prochlorperazine (COMPAZINE) 10 MG tablet take 1 tablet by mouth every 6 hours if needed for nausea and vomiting 06/03/2016: Received from: External Pharmacy   No facility-administered encounter medications on file as of 06/29/2016.     Functional Status:   In your present state of health, do you have any difficulty performing the following activities: 06/21/2016 06/10/2016  Hearing? N N  Vision? N N  Difficulty concentrating or making decisions? N N  Walking or climbing stairs? N N  Dressing or bathing? N N  Doing errands, shopping? N N  Preparing Food and eating ? N N  Using the Toilet? Y Y  In the past six months, have you accidently leaked urine? N N  Do you have problems with loss of bowel control? N N  Managing your Medications? N N  Managing your Finances? N N  Housekeeping or managing your Housekeeping? Y N  Some recent data might be hidden    Fall/Depression Screening:    PHQ 2/9 Scores 06/21/2016 06/10/2016 01/25/2016 07/14/2015 05/05/2015 05/05/2015 04/17/2015  PHQ - 2 Score 6 6 0 0 2 1 4   PHQ- 9 Score 12 16 - - 5 - 10   Depression screen [facility] 2/9 06/21/2016 06/10/2016 01/25/2016 07/14/2015 05/05/2015  Decreased Interest 3 3 0 0 1  Down, Depressed, Hopeless 3 3 0 0 1  PHQ - 2 Score 6 6 0 0 2  Altered sleeping 0 0 - - 0  Tired, decreased energy 3 3 - - 1  Change in appetite 1 2 - - 0  Feeling bad or failure about yourself  1 1 - - 1  Trouble concentrating 0 0 - - 1  Moving slowly or fidgety/restless 1 3 - - 0  Suicidal thoughts 0 1 - - 0  PHQ-9 Score 12 16 - - 5  Difficult doing work/chores Somewhat difficult Not difficult at all - - Somewhat difficult  Some recent data might be hidden    Assessment:   Adenocarcinoma of the rectum                         Depression  Plan:  Will play a supporting role to pt as he goes through his cancer treatment and surgery.            Will assist with placement and finding appropriate care for his sister.            Pt has asked that I talk with their landlord to see if they can be forgiven for one  months rent while pt undergoes his surgery.            I have sent Dr. Burr Medico a message asking about pt plan of care and to please update me.  Deloria Lair [facility] [phone]

## 2016-06-29 NOTE — Telephone Encounter (Signed)
Spoke to patient   patient states has taken blood pressure reading since last office visit. And does not have a way to do so.  aware will defer to Dr hocherin and contact with answer

## 2016-06-30 ENCOUNTER — Encounter: Payer: Self-pay | Admitting: Radiation Oncology

## 2016-06-30 NOTE — Telephone Encounter (Signed)
OK if he cannot measure BP.

## 2016-06-30 NOTE — Progress Notes (Signed)
  Radiation Oncology         [phone] ________________________________  Name: Patrick [name] MRN: [id number]  Date: 06/30/2016  DOB: [birth year]  End of Treatment Note  Diagnosis:   Adenocarcinoma of the rectum     Indication for treatment:  Curative       Radiation treatment dates:   05/12/16 - 06/28/16  Site/dose:   The Rectum was treated to 45 Gy in 25 fractions, and then Boosted an additional 5.4 Gy in 3 fractions.  Beams/energy:   Rectum : 3D  //  15X, 6X        Boost : Isodose Plan  //  15X, 6X  Narrative: The patient tolerated radiation treatment relatively well. The patient experienced some pain to th right hip and thigh which he rated 2/3 out of 10 in severity. The patient presented to the ER on 06/04/16 because of an inability to void. A Foley catheter was placed and the patient was discharged home.  Plan: The patient has completed radiation treatment. The patient will return to radiation oncology clinic for routine followup in one month. I advised them to call or return sooner if they have any questions or concerns related to their recovery or treatment.  ------------------------------------------------  Jodelle Gross, MD, PhD  This document serves as a record of services personally performed by Kyung Rudd, MD. It was created on his behalf by Maryla Morrow, a trained medical scribe. The creation of this record is based on the scribe's personal observations and the provider's statements to them. This document has been checked and approved by the attending provider.

## 2016-07-01 ENCOUNTER — Other Ambulatory Visit: Payer: Self-pay | Admitting: *Deleted

## 2016-07-01 NOTE — Progress Notes (Signed)
[place]  Telephone:([phone]) [phone] Fax:([phone]) [phone]  Clinic Follow up Note   Patient Care Team: Sela Hua, MD as PCP - General (Family Medicine) Leighton Ruff, MD as Consulting Physician (General Surgery) Kyung Rudd, MD as Consulting Physician (Radiation Oncology) Truitt Merle, MD as Consulting Physician (Hematology) Tania Ade, RN as Registered Nurse Minus Breeding, MD as Consulting Physician (Cardiology) [name], RN as [place] as [place] (Licensed Clinical Social Worker) 07/04/2016   CHIEF COMPLAINTS:  Follow up rectal cancer  Oncology History   Adenocarcinoma of rectum [facility])   Staging form: Colon and Rectum, AJCC 7th Edition   - Clinical stage from 04/01/2016: T2, N1 - Signed by Truitt Merle, MD on 05/05/2016      Adenocarcinoma of rectum ([place])   04/01/2016 Initial Diagnosis    Adenocarcinoma of rectum ([place])      04/01/2016 Procedure    Colonoscopy showed a fungating nonobstructing medium-sized mass in the distal rectum, measured 3 cm in lengths. Diverticulosis in the sigmoid colon.      04/01/2016 Initial Biopsy    Rectal mass biopsy showed adenocarcinoma, grade 2      04/04/2016 Imaging    CT chest, abdomen and pelvis with contrast showed no primary rectal carcinoma is not well visualized, 7 mm left posterior perirectal and presacral lymph nodes, indeterminate 1.4 cm low to attenuation lesion in the left hepatic lobe, question hepatic cirrhosis, recommend abdominal MRI for further evaluation. No other evidence of distant metastasis.      04/17/2016 Imaging    MR abdomen w w/o contrast IMPRESSION: 1. Morphologic features of liver compatible cirrhosis. 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 2. There is a complex lesion within the left kidney which is only partially visualized on this study which was tailored to assess  the liver. When compared with study from 04/04/2016 this is favored to represent a small solid enhancing neoplasm, i.e. renal cell carcinoma. Urologic consultation is recommended. 3. The indeterminate lesion within the left lobe of liver described on recent CT remains indeterminate. This large reflects motion artifact which degrades the post-contrast sequences. There are no specific findings associated with this lesion to suggest [place] however correlation with AFP levels would be advised. Additionally, in light of the patient's increased risk for hepatoma and liver metastases, followup imaging with repeat contrast enhanced MRI abdomen in 3-6 months is advised to ensure stability of this lesion.      05/04/2016 Imaging    MR Pelvis w w/o contrast 05/04/16 IMPRESSION: Low rectal adenocarcinoma, T2 N1 MX by imaging. Distance from inferior tumor margin to anorectal junction is <1 cm.      05/12/2016 - 06/28/2016 Radiation Therapy    Concurrent chemoradiation to his rectal cancer      05/12/2016 - 06/28/2016 Chemotherapy    Xeloda 2000 mg in morning and 1500 mg in the evening, held on 05/29/2016 due to rectal bleeding and a cytopenia, restarted at a low dose (1066m am and 500 pm) on 06/20/16        05/29/2016 - 06/02/2016 Hospital Admission    The patient was admitted for rectal bleeding, chemotherapy Xeloda was held, he continued radiation. He did not require blood transfusion. Bleeding spontaneously improved.       HISTORY OF PRESENTING ILLNESS (04/15/2016):  DSaintclair Halsted66y.o. male is here because of his recent diagnosed rectal cancer. He presents to our multidisciplinary GI clinic by  himself today.  He has had intermittent rectal bleeding for a few years, which she contributes to hemorrhoids, slightly worse lately. He used to have regular bowel movement twice a day, which has been more irregular lately, and occasional loose or watery bowel movement. No significant rectal pain. He was  seen by his urologist for routine follow-up of his BPH, and on the rectal exam rectal mass was palpated, and he was referred to colorectal surgeon Dr. Marcello Moores. Colonoscopy on 04/01/2016 showed a fungating nonobstructing medium-sized mass in the distal rectum, 3 cm in length. Biopsy showed adenocarcinoma grade 2. CT CAP was negative distant metastasis, except a 1.4cm indeterminate lesion in the liver.  Review of systems also reviewed slightly decreased appetite, no signal weight loss. No other pain, nausea, or other symptoms. He has significant cardiomyopathy, nonischemic, has orthopnea, mild, able to function and tolerating routine activity at home. He is the primary caregiver of his sister, who is continuous nursing home. He is single, no children. Retired. No family history of colon cancer.  CURRENT THERAPY: Concurrent chemotherapy and irradiation with Xeloda 68m in am and 15036mpm. Started on 05/12/16.  INTERIM HISTORY: Mr. RoGranjaeturns for follow-up. He completed chemo-radiation last week. He is doing well overall. He is not sure what to do with the rest of his Xeloda because he stopped taking it when he completed radiation. He will keep this in case they are needed after surgery. He feels pretty well overall, though he still feels pretty weak. He denies rectal bleeding since completing radiation. He denies any issues with bowel movements. He will see Dr. ThMarcello Moores week from today. He is eating and drinking well.  MEDICAL HISTORY:  Past Medical History:  Diagnosis Date  . Arthritis    "right hand; right knee" (06/19/2014)  . CAD (coronary artery disease)    2v CAD with subtotal occlusion of small co-dominant RCA and borderline lesion in moderate-sized OM-1  . CHF (congestive heart failure) (HCC)    Preserved EF  . Degenerative joint disease of knee, right   . Enlarged prostate   . Hyperlipidemia   . Hypertension   . Pneumonia 05/2014  . Prediabetes 10/03/2014  . Scrotal edema 04/03/2015  .  SMALL BOWEL OBSTRUCTION, HX OF 08/21/2007   Annotation: with narrowing in the ileocecal region Qualifier: Diagnosis of  By: MaHassell DoneNP, NyTori Milks    SURGICAL HISTORY: Past Surgical History:  Procedure Laterality Date  . CARDIAC CATHETERIZATION N/A 02/16/2016   Procedure: Right/Left Heart Cath and Coronary Angiography;  Surgeon: DaJolaine ArtistMD;  [facility];  Service: Cardiovascular;  Laterality: N/A;  . COLONOSCOPY N/A 04/01/2016   Procedure: COLONOSCOPY;  Surgeon: AlLeighton RuffMD;  Location: [facility];  Service: Endoscopy;  Laterality: N/A;  . INGUINAL HERNIA REPAIR Left 1990's    SOCIAL HISTORY: Social History   Social History  . Marital status: Single    Spouse name: N/A  . Number of children: N/A  . Years of education: N/A   Occupational History  . Not on file.   Social History Main Topics  . Smoking status: Former Smoker    Years: 5.00    Types: Pipe, CiLandscape architect. Smokeless tobacco: Never Used     Comment: 06/19/2014 "stopped smoking in ~ 2014; used to smoke a pipe or cigar a couple times/month"  . Alcohol use No  . Drug use: No  . Sexual activity: No   Other Topics Concern  . Not on file  Social History Narrative   Single, lives w/his sister of whom he is caregiver       FAMILY HISTORY: Family History  Problem Relation Age of Onset  . Hypertension Mother   . Diabetes Mother   . Stroke Mother   . Cancer Father 27    Prostate  . Dementia Father   . COPD Sister   . Arthritis Sister   . Edema Sister     ALLERGIES:  has No Known Allergies.  MEDICATIONS:  Current Outpatient Prescriptions  Medication Sig Dispense Refill  . acetaminophen (TYLENOL) 500 MG tablet Take 1,000 mg by mouth every 6 (six) hours as needed for mild pain.    . Ascorbic Acid (VITAMIN C) 1000 MG tablet Take 2,000 mg by mouth 2 (two) times daily.    . carvedilol (COREG) 25 MG tablet Take 1 tablet (25 mg total) by mouth 2 (two) times daily. 180 tablet 3  .  ferrous sulfate 325 (65 FE) MG tablet take 1 tablet by mouth once daily WITH BREAKFAST 30 tablet 3  . finasteride (PROSCAR) 5 MG tablet take 1 tablet by mouth once daily 90 tablet 1  . losartan (COZAAR) 25 MG tablet Take 0.5 tablets (12.5 mg total) by mouth daily. 45 tablet 3  . lovastatin (MEVACOR) 20 MG tablet take 1 tablet by mouth at bedtime 90 tablet 1  . mirtazapine (REMERON SOL-TAB) 15 MG disintegrating tablet place 1 tablet ON TONGUE at bedtime (Patient taking differently: Take 15 mg by mouth at bedtime. place 1 tablet ON TONGUE at bedtime) 90 tablet 0  . Multiple Vitamins-Minerals (MULTIVITAMIN WITH MINERALS) tablet Take 1 tablet by mouth daily.    . nitroGLYCERIN (NITROSTAT) 0.4 MG SL tablet Place 1 tablet (0.4 mg total) under the tongue every 5 (five) minutes as needed for chest pain. 25 tablet 3  . omega-3 acid ethyl esters (LOVAZA) 1 g capsule take 2 capsules by mouth twice a day 360 capsule 1  . oxyCODONE-acetaminophen (PERCOCET/ROXICET) 5-325 MG tablet Take 1 tablet by mouth every 8 (eight) hours as needed for severe pain. 90 tablet 0  . potassium chloride SA (K-DUR,KLOR-CON) 20 MEQ tablet take 3 tablets by mouth once daily 270 tablet 3  . prochlorperazine (COMPAZINE) 10 MG tablet take 1 tablet by mouth every 6 hours if needed for nausea and vomiting  0  . sulfamethoxazole-trimethoprim (BACTRIM DS,SEPTRA DS) 800-160 MG tablet Take 1 tablet by mouth 2 (two) times daily.  0  . tamsulosin (FLOMAX) 0.4 MG CAPS capsule Take 0.4 mg by mouth daily.     Marland Kitchen torsemide (DEMADEX) 20 MG tablet Take 60 mg by mouth 2 (two) times daily.    . traZODone (DESYREL) 100 MG tablet take 1 tablet by mouth twice a day 180 tablet 1  . capecitabine (XELODA) 500 MG tablet Take 2 tablets in the morning and 1 tablets at night     No current facility-administered medications for this visit.     REVIEW OF SYSTEMS:   Constitutional: Denies fevers, chills or abnormal night sweats (+) fatigue, weakness Eyes: Denies  blurriness of vision, double vision or watery eyes Ears, nose, mouth, throat, and face: Denies mucositis or sore throat Respiratory: Denies cough, dyspnea or wheezes (+) SOB on exertion. Cardiovascular: Denies palpitation, chest discomfort or lower extremity swelling Gastrointestinal:  Denies heartburn. Skin: Denies abnormal skin rashes Lymphatics: Denies new lymphadenopathy or easy bruising Neurological:Denies numbness, tingling or new weaknesses Behavioral/Psych: Mood is stable, no new changes  All other systems were reviewed  with the patient and are negative.  PHYSICAL EXAMINATION: ECOG PERFORMANCE STATUS: 1 - Symptomatic but completely ambulatory  Vitals:   07/04/16 1448  BP: 134/76  Pulse: (!) 55  Resp: 18  Temp: 98.7 F (37.1 C)   Filed Weights   07/04/16 1448  Weight: 202 lb 8 oz (91.9 kg)    GENERAL:alert, no distress and comfortable SKIN: skin color, texture, turgor are normal, no rashes or significant lesions EYES: normal, conjunctiva are pink and non-injected, sclera clear OROPHARYNX:no exudate, no erythema and lips, buccal mucosa, and tongue normal  NECK: supple, thyroid normal size, non-tender, without nodularity LYMPH:  no palpable lymphadenopathy in the cervical, axillary or inguinal LUNGS: clear to auscultation and percussion with normal breathing effort HEART: regular rate & rhythm and no murmurs and no lower extremity edema ABDOMEN:abdomen soft, non-tender and normal bowel sounds, rectal exam showed a firm mass in the low rectum anteriorly, no bleeding. Bilateral inguinal lymph nodes were negative. Musculoskeletal:no cyanosis of digits and no clubbing  PSYCH: alert & oriented x 3 with fluent speech NEURO: no focal motor/sensory deficits  LABORATORY DATA:  I have reviewed the data as listed CBC Latest Ref Rng & Units 07/04/2016 06/27/2016 06/20/2016  WBC 4.0 - 10.3 10e3/uL 4.4 4.1 4.9  Hemoglobin 13.0 - 17.1 g/dL 10.5(L) 9.6(L) 9.7(L)  Hematocrit 38.4 - 49.9  % 32.7(L) 30.8(L) 29.8(L)  Platelets 140 - 400 10e3/uL 102(L) 73(L) 118(L)   CMP Latest Ref Rng & Units 07/04/2016 06/20/2016 06/13/2016  Glucose 70 - 140 mg/dl 97 106 101  BUN 7.0 - 26.0 mg/dL 12.3 12.0 15.6  Creatinine 0.7 - 1.3 mg/dL 1.2 1.4(H) 1.3  Sodium 136 - 145 mEq/L 144 141 147(H)  Potassium 3.5 - 5.1 mEq/L 3.8 4.0 4.1  Chloride 101 - 111 mmol/L - - -  CO2 22 - 29 mEq/L 28 28 31(H)  Calcium 8.4 - 10.4 mg/dL 9.2 9.1 9.0  Total Protein 6.4 - 8.3 g/dL 7.7 7.3 6.9  Total Bilirubin 0.20 - 1.20 mg/dL 0.82 1.28(H) 1.07  Alkaline Phos 40 - 150 U/L 99 93 84  AST 5 - 34 U/L '16 15 15  ' ALT 0 - 55 U/L '11 7 9   ' PATHOLOGY REPORT  Diagnosis 04/01/2016 Rectum, biopsy, mass INVASIVE ADENOCARCINOMA, GRADE 2 MARGIN OF RESECTION IS POSITIVE FOR ADENOCARCINOMA  MMR-normal   RADIOGRAPHIC STUDIES: I have personally reviewed the radiological images as listed and agreed with the findings in the report. No results found. COLONOSCOPY 04/01/2016 Dr. Marcello Moores  A fungating non-obstructing medium-sized mass was found in the distal rectum. The mass was noncircumferential. The mass measured three cm in length. No bleeding was present. Biopsies were taken with a cold forceps for histology. IMPRESSION  - Diverticulosis in the sigmoid colon. 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 Biopsied.  ASSESSMENT & PLAN: 66 y.o. Caucasian male, with past medical history of hypertension, chronic systolic heart failure with EF of 20-25%, iron deficient anemia, presented with a palpable rectal mass.  1. Adenoma of rectum, low anterior rectum, cT2N1M0, stage IIIA -I previously reviewed his CT scan, endoscopy, and biopsy results in details with patient -His CT scan reviewed no definitive evidence of distant metastasis, however he a 1.4 cm indeterminate liver lesion, need further workup.  -His pelvic MRI showed T2N1 disease, I reviewed with patient -His abdominal MRI showed indeterminate liver lesion, needs close follow-up.  He also has imaging evidence of liver cirrhosis, unclear etiology. AFP was normal  -We previously reviewed the natural history of rectal cancer  and treatment options -He underwent neoadjuvant chemoradiation, tolerated well. However due to his significant cytopenia, Xeloda was held and the dose reduced significantly. -He has completed concurrent chemo-radiation treatment. -Lab reviewed, counts have improved overall. -He is scheduled to meet with surgeon Dr. Marcello Moores next week. He will likely have APR in 6-8 weeks after he completes radiation and chemotherapy. -We briefly discussed the role of adjuvant chemotherapy after his surgery. He depend on his residual disease and no lymph nodes involvement, we'll decide about single agent Xeloda versus FOLFOX as adjuvant chemotherapy. -I plan to see him back after his surgery.  2. HTN, chronic systolic heart failure with EF 20-25% -He will continue follow-up with his primary care physician and cardiologist -Creatinine 1.6 on 05/19/16, worse than before. Hold the Metolazone and decrease the Torsemide to 3 tablets twice daily. -Slightly increase his water intake to 40-50 oz a day. -He will follow-up with his cardiologist closely -Creatinine 1.2 TODAY  3. Iron deficient anemia -His on study in 2016 was consistent with iron deficiency -He is on oral iron, we'll continue -Iron on 06/20/16 was 56.   4. Liver lesion -He has a 1.4 cm indeterminate liver lesion in the left lobe, found on his staging CT scan, and was evaluated by MRI, no typical MRI image characteristics of hepatocellular carcinoma, however image quality was limited. Giving the imaging evidence of liver cirrhosis, I'll check AFP. -MRI of the abd on 04/17/16 shows the liver lesion remains indeterminate. Will repeat in the future.  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 -No other signs of bleeding except mild rectal bleeding from the tumor   Plan -He has  completed chemoradiation on 06/29/2016. He is scheduled to meet with surgeon Dr. Marcello Moores next week. -He is scheduled to follow up in radiation oncology with Shona Simpson, PA-C on 08/04/2016 -I will see him again after surgery to finalize his adjuvant chemotherapy plan.  All questions were answered. The patient knows to call the clinic with any problems, questions or concerns. I spent 20 minutes counseling the patient face to face. The total time spent in the appointment was 25 minutes and more than 50% was on counseling.   This document serves as a record of services personally performed by Truitt Merle, MD. It was created on her behalf by Arlyce Harman, a trained medical scribe. The creation of this record is based on the scribe's personal observations and the provider's statements to them. This document has been checked and approved by the attending provider.   Truitt Merle, MD 07/04/2016

## 2016-07-01 NOTE — Patient Outreach (Signed)
[facility]) Care Management  07/01/2016  Patrick [name] 20-Sep-1950 [id number]   Weekly transition of care call placed to member.  He state that he completed his radiation therapy this week and has also completed his chemotherapy.  He report that he has a follow up appointment next week with the oncologist as well as lab work.  At that time he state he may have a plan of care regarding his surgery.  He denies any concerns of urinary retention or bleeding.    He denies any concerns at this time.  He is aware that his transition of care program is complete, will follow up within 2 weeks with home visit.  [name], [place], MSN [place] [phone]

## 2016-07-04 ENCOUNTER — Encounter: Payer: Self-pay | Admitting: Hematology

## 2016-07-04 ENCOUNTER — Telehealth: Payer: Self-pay | Admitting: Physician Assistant

## 2016-07-04 ENCOUNTER — Other Ambulatory Visit (HOSPITAL_BASED_OUTPATIENT_CLINIC_OR_DEPARTMENT_OTHER): Payer: Medicare Other

## 2016-07-04 ENCOUNTER — Other Ambulatory Visit: Payer: Self-pay | Admitting: Internal Medicine

## 2016-07-04 ENCOUNTER — Ambulatory Visit (HOSPITAL_BASED_OUTPATIENT_CLINIC_OR_DEPARTMENT_OTHER): Payer: Medicare Other | Admitting: Hematology

## 2016-07-04 VITALS — BP 134/76 | HR 55 | Temp 98.7°F | Resp 18 | Ht 69.0 in | Wt 202.5 lb

## 2016-07-04 DIAGNOSIS — K769 Liver disease, unspecified: Secondary | ICD-10-CM | POA: Diagnosis not present

## 2016-07-04 DIAGNOSIS — D500 Iron deficiency anemia secondary to blood loss (chronic): Secondary | ICD-10-CM

## 2016-07-04 DIAGNOSIS — C20 Malignant neoplasm of rectum: Secondary | ICD-10-CM

## 2016-07-04 DIAGNOSIS — I10 Essential (primary) hypertension: Secondary | ICD-10-CM

## 2016-07-04 DIAGNOSIS — I5022 Chronic systolic (congestive) heart failure: Secondary | ICD-10-CM

## 2016-07-04 DIAGNOSIS — D509 Iron deficiency anemia, unspecified: Secondary | ICD-10-CM | POA: Diagnosis not present

## 2016-07-04 DIAGNOSIS — D6959 Other secondary thrombocytopenia: Secondary | ICD-10-CM

## 2016-07-04 LAB — COMPREHENSIVE METABOLIC PANEL
ALBUMIN: 3.8 g/dL (ref 3.5–5.0)
ALK PHOS: 99 U/L (ref 40–150)
ALT: 11 U/L (ref 0–55)
AST: 16 U/L (ref 5–34)
Anion Gap: 9 mEq/L (ref 3–11)
BUN: 12.3 mg/dL (ref 7.0–26.0)
CO2: 28 mEq/L (ref 22–29)
Calcium: 9.2 mg/dL (ref 8.4–10.4)
Chloride: 107 mEq/L (ref 98–109)
Creatinine: 1.2 mg/dL (ref 0.7–1.3)
EGFR: 62 mL/min/{1.73_m2} — AB (ref >90)
GLUCOSE: 97 mg/dL (ref 70–140)
POTASSIUM: 3.8 meq/L (ref 3.5–5.1)
SODIUM: 144 meq/L (ref 136–145)
Total Bilirubin: 0.82 mg/dL (ref 0.20–1.20)
Total Protein: 7.7 g/dL (ref 6.4–8.3)

## 2016-07-04 LAB — CBC WITH DIFFERENTIAL/PLATELET
BASO%: 0.7 % (ref 0.0–2.0)
Basophils Absolute: 0 10*3/uL (ref 0.0–0.1)
EOS ABS: 0.1 10*3/uL (ref 0.0–0.5)
EOS%: 2.3 % (ref 0.0–7.0)
HEMATOCRIT: 32.7 % — AB (ref 38.4–49.9)
HGB: 10.5 g/dL — ABNORMAL LOW (ref 13.0–17.1)
LYMPH#: 0.3 10*3/uL — AB (ref 0.9–3.3)
LYMPH%: 7.3 % — ABNORMAL LOW (ref 14.0–49.0)
MCH: 28.5 pg (ref 27.2–33.4)
MCHC: 32 g/dL (ref 32.0–36.0)
MCV: 88.9 fL (ref 79.3–98.0)
MONO#: 0.5 10*3/uL (ref 0.1–0.9)
MONO%: 10.3 % (ref 0.0–14.0)
NEUT%: 79.4 % — AB (ref 39.0–75.0)
NEUTROS ABS: 3.5 10*3/uL (ref 1.5–6.5)
PLATELETS: 102 10*3/uL — AB (ref 140–400)
RBC: 3.68 10*6/uL — ABNORMAL LOW (ref 4.20–5.82)
RDW: 24 % — ABNORMAL HIGH (ref 11.0–14.6)
WBC: 4.4 10*3/uL (ref 4.0–10.3)

## 2016-07-04 LAB — IRON AND TIBC
%SAT: 12 % — AB (ref 20–55)
Iron: 43 ug/dL (ref 42–163)
TIBC: 348 ug/dL (ref 202–409)
UIBC: 305 ug/dL (ref 117–376)

## 2016-07-04 LAB — CEA (IN HOUSE-CHCC): CEA (CHCC-In House): 1.73 ng/mL (ref 0.00–5.00)

## 2016-07-04 NOTE — Telephone Encounter (Signed)
Pt needs a refill oxycodone. Please advise. ep

## 2016-07-04 NOTE — Telephone Encounter (Signed)
Received records from [facility] for appointment on 07/25/16 with Rosaria Ferries, PA  Records put with Rhonda's schedule for 07/25/16. lp

## 2016-07-05 ENCOUNTER — Ambulatory Visit
Admission: RE | Admit: 2016-07-05 | Discharge: 2016-07-05 | Disposition: A | Discharge status: Home / Self Care | Payer: Medicare Other | Source: Ambulatory Visit | Attending: Urology | Admitting: Urology

## 2016-07-05 ENCOUNTER — Other Ambulatory Visit: Payer: Self-pay | Admitting: Internal Medicine

## 2016-07-05 ENCOUNTER — Other Ambulatory Visit: Payer: Self-pay | Admitting: *Deleted

## 2016-07-05 ENCOUNTER — Other Ambulatory Visit: Payer: Self-pay | Admitting: Interventional Radiology

## 2016-07-05 DIAGNOSIS — N289 Disorder of kidney and ureter, unspecified: Secondary | ICD-10-CM

## 2016-07-05 HISTORY — PX: IR GENERIC HISTORICAL: IMG1180011

## 2016-07-05 NOTE — Telephone Encounter (Signed)
Spoke with Patrick [name] letting him know to take Losartan 25 mg daily

## 2016-07-05 NOTE — Telephone Encounter (Signed)
Pt stated he's having problem cutting his Losartan 25 mg in half because the pill is too small, he wants to know if he can take losartan every other day instead

## 2016-07-05 NOTE — Consult Note (Signed)
Chief Complaint: I have a kidney tumor.   Referring Physician(s): Dr. Louis Meckel, Urology  History of Present Illness: Patrick [name] is a 66 y.o. male presenting to vascular and interventional radiology clinic today as a scheduled appointment, kindly referred by Dr. 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  Patrick [name] received the imaging diagnosis recently on contrast-enhanced MRI 04/17/2016. This MRI was liver protocol to evaluate an incidental liver lesion, as he has cirrhosis, with identification of the left-sided renal tumor. There are 2 separate CT studies available for comparison 2016/04/18, and 09/30/2012.  He denies any symptoms attributable to the renal cell carcinoma. Specifically he denies any urinary symptoms, hematuria, or flank pain.  In retrospect, the tumor measured perhaps 1.6 cm in 2014 with slow growth to 2.6 cm on CT 04/18/16.   Patrick [name]'s medical history is complicated by a recently discovered rectal carcinoma, diagnosed on physical exam by Dr. Louis Meckel.  His current oncology team includes Dr. Leighton Ruff of [place] surgery, Dr. Kyung Rudd of radiation oncology [place], Dr. Truitt Merle of hematology/oncology of [place].  Until last Tuesday, he tells me he has been receiving chemotherapy and radiation therapy. He tells me as of last Tuesday he has no further scheduled therapy. He has had 2 appointments with Dr. Marcello Moores, with upcoming appointment for decision making regarding any future surgery.  He did tell me he has had admission to [facility] 05/29/2016 area to admission diagnosis was bright red blood per rectum. He tells me he had no procedure or surgery performed, and it resolved spontaneously with medical therapy. His discharge was 06/01/2016.  Since that admission he has had no problems.  Medical comorbidities include known coronary artery disease with congestive heart failure,  liver cirrhosis, hyperlipidemia, hypertension, diabetes.  From a social standpoint, he lives with his sister, and is her primary caretaker, as apparently she has morbid obesity with a weight of greater than 350 pounds. He is concerned that he would not be able to help care for her should he have a complicated treatment plan.  During our time, the majority of our conversation was regarding his medical history and renal cell carcinoma, including the natural history, pathology/pathophysiology, and treatment options which would include surveillance, surgical solutions with nephron sparing surgery, and interventional solutions with CT-guided ablation. Specifically, I discussed with him the option of CT-guided cryoablation.  Regarding risks, specific risks I discussed included: Bleeding, infection, injury to local structures, recurrence/progression, anesthesia complications, need for further surgery/procedure, nerve injury/sensory changes including pain, cardiopulmonary collapse, death.  The left-sided tumor, presumed renal cell carcinoma, is of a size, location, and geography adjacent to the collecting system with a high complexity nephrometry score ([email or web address].com/index) of 10p.        Past Medical History:  Diagnosis Date  . Arthritis    "right hand; right knee" (06/19/2014)  . CAD (coronary artery disease)    2v CAD with subtotal occlusion of small co-dominant RCA and borderline lesion in moderate-sized OM-1  . CHF (congestive heart failure) (HCC)    Preserved EF  . Degenerative joint disease of knee, right   . Enlarged prostate   . Hyperlipidemia   . Hypertension   . Pneumonia 05/2014  . Prediabetes 10/03/2014  . Scrotal edema 04/03/2015  . SMALL BOWEL OBSTRUCTION, HX OF 08/21/2007   Annotation: with narrowing in the ileocecal region Qualifier: Diagnosis of  By: Hassell Done FNP, Tori Milks  Past Surgical History:  Procedure Laterality Date  . CARDIAC CATHETERIZATION N/A 02/16/2016    Procedure: Right/Left Heart Cath and Coronary Angiography;  Surgeon: Jolaine Artist, MD;  Location: [place] CV LAB;  Service: Cardiovascular;  Laterality: N/A;  . COLONOSCOPY N/A 04/01/2016   Procedure: COLONOSCOPY;  Surgeon: Leighton Ruff, MD;  Location: [facility] ENDOSCOPY;  Service: Endoscopy;  Laterality: N/A;  . INGUINAL HERNIA REPAIR Left 1990's    Allergies: Patient has no known allergies.  Medications: Prior to Admission medications   Medication Sig Start Date End Date Taking? Authorizing Provider  acetaminophen (TYLENOL) 500 MG tablet Take 1,000 mg by mouth every 6 (six) hours as needed for mild pain.    Historical Provider, MD  Ascorbic Acid (VITAMIN C) 1000 MG tablet Take 2,000 mg by mouth 2 (two) times daily.    Historical Provider, MD  capecitabine (XELODA) 500 MG tablet Take 2 tablets in the morning and 1 tablets at night    Historical Provider, MD  carvedilol (COREG) 25 MG tablet Take 1 tablet (25 mg total) by mouth 2 (two) times daily. 12/08/15   Minus Breeding, MD  ferrous sulfate 325 (65 FE) MG tablet take 1 tablet by mouth once daily WITH BREAKFAST 08/11/15   Kinnie Feil, MD  finasteride (PROSCAR) 5 MG tablet take 1 tablet by mouth once daily 05/18/16   Sela Hua, MD  losartan (COZAAR) 25 MG tablet Take 0.5 tablets (12.5 mg total) by mouth daily. 06/24/16 09/22/16  Minus Breeding, MD  lovastatin (MEVACOR) 20 MG tablet take 1 tablet by mouth at bedtime 05/25/16   Sela Hua, MD  mirtazapine (REMERON SOL-TAB) 15 MG disintegrating tablet place 1 tablet ON TONGUE at bedtime Patient taking differently: Take 15 mg by mouth at bedtime. place 1 tablet ON TONGUE at bedtime 01/25/16   Sela Hua, MD  Multiple Vitamins-Minerals (MULTIVITAMIN WITH MINERALS) tablet Take 1 tablet by mouth daily.    Historical Provider, MD  nitroGLYCERIN (NITROSTAT) 0.4 MG SL tablet Place 1 tablet (0.4 mg total) under the tongue every 5 (five) minutes as needed for chest pain. 04/29/16 07/28/16   Evelene Croon Barrett, PA-C  omega-3 acid ethyl esters (LOVAZA) 1 g capsule take 2 capsules by mouth twice a day 03/02/16   Sela Hua, MD  oxyCODONE-acetaminophen (PERCOCET/ROXICET) 5-325 MG tablet Take 1 tablet by mouth every 8 (eight) hours as needed for severe pain. 05/12/15   Kinnie Feil, MD  potassium chloride SA (K-DUR,KLOR-CON) 20 MEQ tablet take 3 tablets by mouth once daily 06/03/16   Minus Breeding, MD  prochlorperazine (COMPAZINE) 10 MG tablet take 1 tablet by mouth every 6 hours if needed for nausea and vomiting 05/20/16   Historical Provider, MD  sulfamethoxazole-trimethoprim (BACTRIM DS,SEPTRA DS) 800-160 MG tablet Take 1 tablet by mouth 2 (two) times daily. 06/23/16   Historical Provider, MD  tamsulosin (FLOMAX) 0.4 MG CAPS capsule Take 0.4 mg by mouth daily.     Historical Provider, MD  torsemide (DEMADEX) 20 MG tablet Take 60 mg by mouth 2 (two) times daily.    Historical Provider, MD  traZODone (DESYREL) 100 MG tablet take 1 tablet by mouth twice a day 05/19/16   Sela Hua, MD     Family History  Problem Relation Age of Onset  . Hypertension Mother   . Diabetes Mother   . Stroke Mother   . Cancer Father 62    Prostate  . Dementia Father   . COPD Sister   .  Arthritis Sister   . Edema Sister     Social History   Social History  . Marital status: Single    Spouse name: N/A  . Number of children: N/A  . Years of education: N/A   Social History Main Topics  . Smoking status: Former Smoker    Years: 5.00    Types: Pipe, Landscape architect  . Smokeless tobacco: Never Used     Comment: 06/19/2014 "stopped smoking in ~ 2014; used to smoke a pipe or cigar a couple times/month"  . Alcohol use No  . Drug use: No  . Sexual activity: No   Other Topics Concern  . Not on file   Social History Narrative   Single, lives w/his sister of whom he is caregiver       ECOG Status: 1 - Symptomatic but completely ambulatory  Review of Systems: A 12 point ROS discussed and  pertinent positives are indicated in the HPI above.  All other systems are negative.  Review of Systems  Vital Signs: BP 128/65 (BP Location: Left Arm, Patient Position: Sitting, Cuff Size: Normal)   Pulse (!) 53   Temp 98.1 F (36.7 C) (Oral)   Resp 15   Ht 5\' 9"  (1.753 m)   Wt 202 lb (91.6 kg)   SpO2 98%   BMI 29.83 kg/m   Physical Exam  Atraumatic, normocephalic. His membranes moist pink. Conjugate gaze. No scleral icterus or scleral injection. Moving all 4 extremities equally and grossly with gross motor or gross sensory intact. Appropriate affect. Appropriate insightful questions. Symmetric excursion of the chest on inspiration and expiration. No labored breathing. Skin without rash or lesion. Abdomen soft nontender. Genitourinary deferred.  Mallampati Score:  2  Imaging: No results found.  Labs:  CBC:  Recent Labs  06/13/16 1416 06/20/16 1419 06/27/16 1415 07/04/16 1413  WBC 4.3 4.9 4.1 4.4  HGB 9.0* 9.7* 9.6* 10.5*  HCT 28.4* 29.8* 30.8* 32.7*  PLT 86* 118* 73* 102*    COAGS:  Recent Labs  02/09/16 1332 05/29/16 2111  INR 1.1 1.35    BMP:  Recent Labs  05/31/16 0443 06/01/16 0559 06/02/16 0524 06/04/16 1125 06/07/16 1321 06/13/16 1416 06/20/16 1419 07/04/16 1413  NA 140 142 144 140 143 147* 141 144  K 3.8 3.4* 3.7 3.2* 3.5 4.1 4.0 3.8  CL 109 111 111 106  --   --   --   --   CO2 25 22 24 25  32* 31* 28 28  GLUCOSE 93 129* 116* 95 141* 101 106 97  BUN 11 11 11 12  13.4 15.6 12.0 12.3  CALCIUM 8.2* 8.1* 8.2* 8.3* 8.8 9.0 9.1 9.2  CREATININE 0.97 1.23 1.17 1.18 1.3 1.3 1.4* 1.2  GFRNONAA >60 59* >60 >60  --   --   --   --   GFRAA >60 >60 >60 >60  --   --   --   --     LIVER FUNCTION TESTS:  Recent Labs  06/07/16 1321 06/13/16 1416 06/20/16 1419 07/04/16 1413  BILITOT 1.49* 1.07 1.28* 0.82  AST 12 15 15 16   ALT 8 9 7 11   ALKPHOS 92 84 93 99  PROT 7.1 6.9 7.3 7.7  ALBUMIN 3.5 3.4* 3.5 3.8    TUMOR MARKERS: No results  for input(s): AFPTM, CEA, CA199, CHROMGRNA in the last 8760 hours.  Assessment and Plan:  Patrick [name] is a pleasant 66 year old gentleman with incidentally discovered left renal tumor, presumably renal cell carcinoma, with a  size of less than 3 cm which would be stage T 1a.    We had a lengthy discussion regarding the treatment option of CT-guided ablation, of which I would offer cryoablation, as well as the logistics involved with preprocedural workup, treatment day, and recovery period.  While I do believe he is a good candidate for ablation given the tumor size, there are a few details which would complicate the treatment strategy. These include: Preprocedural cardiology workup given his history of coronary artery disease, congestive heart failure, and cirrhosis Consideration of intra-procedural ureteral stent for fluid infusion for protection given the geography of the tumor adjacent to the collecting system Recovery logistics which would include modified activities for at least one week afterwards. (He is concerned because his sister relies on his care which would be limited if we treated him.)  As he is currently undergoing therapy for a new diagnosis of rectal carcinoma, and has decisions to make regarding further surgery/medical/radiation treatment, he admits that he is overwhelmed at this time by the presumed RCC diagnosis.  I did discuss with him my impression that this is a slow-growing tumor, having grown approximately 1 cm in the interval of approximately 4 years.  I did emphasize that early treatment offers better outcomes with renal cell carcinoma, particularly with these size RCC, it would be reasonable, given his social situation, comorbidities, and parallel treatment plan for rectal carcinoma to employ a conservative surveillance approach with his follow-up imaging studies.  Plan: At this time his preference would be surveillance, as he is not ready to commit to a treatment plan for  the kidney tumor.  We will review all of his future cross-sectional imaging that is prescribed for the rectal carcinoma treatment, and if there is no contrast-enhanced CT before 6 months, we will order a 6 month renal protocol contrast enhanced abdominal CT (as an alternative in the setting of reduced renal function, MRI).  I answered all of his questions to the best of my ability.    Thank you for this interesting consult.  I greatly enjoyed meeting Patrick [name] and look forward to participating in their care.  A copy of this report was sent to the requesting provider on this date.  Electronically Signed: Corrie Mckusick 07/05/2016, 2:44 PM   I spent a total of  40 Minutes   in face to face in clinical consultation, greater than 50% of which was counseling/coordinating care for left renal cell carcinoma, possible CT-guided ablation.

## 2016-07-05 NOTE — Telephone Encounter (Signed)
I would prefer that he use the whole pill daily.

## 2016-07-06 ENCOUNTER — Encounter: Payer: Self-pay | Admitting: *Deleted

## 2016-07-06 NOTE — Patient Outreach (Signed)
[facility]) Care Management  07/06/2016  Patrick [name] [birth year] [id number]   CSW was able to make contact with patient today to follow-up regarding his scheduled surgery, as well as to discuss short-term respite care for his sister, Jamarqus Mcshea, with whom patient currently resides.  According to patient, he is scheduled to see a surgeon on Monday to discuss the actual procedure, as well as to schedule the surgery.  Patient was encouraged to discuss recovery time after the surgery and if patient will need to be placed in a skilled nursing facility for short-term rehabilitative services. Patient indicated that he had a follow-up appointment with a physician at [facility] yesterday, learning that he has a 3 centimeter lesion growing on one of his kidneys.  Patient reported, "They know it's cancer, but they [place]'t think it's related to my colon cancer".  Patient recently underwent aggressive therapies (both chemotherapy and radiation) for Adenocarcinoma of the Rectum at the [facility].  Patient did not sound discouraged, reporting that he plans to try and remain optimistic.  CSW offered counseling and supportive services, where appropriate.  CSW agreed to follow-up with patient next week, after he meets with the surgeon, to determine when patient will be hospitalized for surgery. [name], BSW, MSW, LCSW  Licensed Education officer, [facility]  Mailing [place] [address] [place], [place] [address] Physical Address-[address], [place], [place] [address] Toll Free Main # [phone] Fax # [phone] Cell # [phone]  Office # [phone] [email or web address]

## 2016-07-07 ENCOUNTER — Other Ambulatory Visit: Payer: Self-pay | Admitting: Internal Medicine

## 2016-07-07 MED ORDER — OXYCODONE-ACETAMINOPHEN 5-325 MG PO TABS: 1.0000 | ORAL_TABLET | Freq: Three times a day (TID) | ORAL | 0 refills | Status: DC | PRN

## 2016-07-07 NOTE — Telephone Encounter (Signed)
Did you leave Dons up front as well?

## 2016-07-07 NOTE — Telephone Encounter (Signed)
Patient came in to pick up to pick Rx for oxyCODONE-acetaminophen (PERCOCET/ROXICET) 5-325 MG    Was on pick up list but not in basket. Please f/u with patient.

## 2016-07-08 ENCOUNTER — Other Ambulatory Visit: Payer: Self-pay | Admitting: *Deleted

## 2016-07-08 ENCOUNTER — Other Ambulatory Visit: Payer: Self-pay | Admitting: Internal Medicine

## 2016-07-08 MED ORDER — OXYCODONE-ACETAMINOPHEN 5-325 MG PO TABS: 1.0000 | ORAL_TABLET | Freq: Three times a day (TID) | ORAL | 0 refills | Status: DC | PRN

## 2016-07-08 NOTE — Patient Outreach (Signed)
[facility]) Care Management  07/08/2016  DAYMEIN GELFAND 01-21-1951 [id number]   Call placed to member to follow up on current health status and to discuss reassignment of case to NP, C. Spinks.  However, there was no answer, HIPAA compliant voice message left.  Ms. [name] will take over case as she is currently also working with his sister.  Plan of care will be provided for both per [name].  [name], [place], MSN [place] [phone]

## 2016-07-09 ENCOUNTER — Telehealth: Payer: Self-pay | Admitting: Hematology

## 2016-07-09 NOTE — Telephone Encounter (Signed)
No los per 07/04/16 visit.

## 2016-07-11 ENCOUNTER — Other Ambulatory Visit: Payer: Self-pay | Admitting: General Surgery

## 2016-07-11 DIAGNOSIS — Z79899 Other long term (current) drug therapy: Secondary | ICD-10-CM | POA: Diagnosis not present

## 2016-07-11 DIAGNOSIS — C801 Malignant (primary) neoplasm, unspecified: Secondary | ICD-10-CM | POA: Diagnosis not present

## 2016-07-11 NOTE — H&P (Signed)
History of Present Illness Leighton Ruff MD; [id number] 2:34 PM) The patient is a 66 year old male who presents with a complaint of Rectal bleeding. 66 year old male who presents to the office as a referral due to rectal mass palpated during his prostate exam by his urologist. The patient states that he has had stool changes and rectal bleeding occasionally. He has had some weight loss recently but this was delivered. He has never had any abdominal surgery before except for an inguinal hernia repair. He denies any abdominal pain. He was taken to colonoscopy and a distal rectal cancer was identified. He was sent for radiation and chemotherapy. His final treatment was June 28, 2016. He seems to have tolerated this well.   Past Surgical History Malachy Moan, [place]; 07/11/2016 2:02 PM) Ventral / Umbilical Hernia Surgery Bilateral.  Diagnostic Studies History Malachy Moan, RMA; 07/11/2016 2:02 PM) Colonoscopy 04/01/2016 Abnormal. within last year  Allergies Malachy Moan, RMA; 07/11/2016 2:03 PM) No Known Allergies 03/21/2016  Medication History Malachy Moan, RMA; 07/11/2016 2:03 PM) Carvedilol (25MG  Tablet, Oral) Active. Ferrous Sulfate (325 (65 Fe)MG Tablet, Oral) Active. Finasteride (5MG  Tablet, Oral) Active. Losartan Potassium (50MG  Tablet, Oral) Active. Lovastatin (20MG  Tablet, Oral) Active. Mirtazapine (15MG  Tablet Disint, Oral) Active. Omega-3-acid Ethyl Esters (1GM Capsule, Oral) Active. Potassium Chloride Crys ER (20MEQ Tablet ER, Oral) Active. Tamsulosin HCl (0.4MG  Capsule, Oral) Active. Torsemide (20MG  Tablet, Oral) Active. TraZODone HCl (100MG  Tablet, Oral) Active. Vitamin C (1000MG  Tablet, Oral) Active. Aspirin (81MG  Tablet DR, Oral) Active. Multivitamin Adult (Oral) Active. Oxycodone-Acetaminophen (5-325MG  Tablet, Oral) Active. Medications Reconciled  Social History Malachy Moan, [place]; 07/11/2016 2:03 PM) Alcohol use Remotely  quit alcohol use. Caffeine use Carbonated beverages. Illicit drug use Remotely quit drug use. Tobacco use Never smoker.  Family History Malachy Moan, [place]; 07/11/2016 2:03 PM) Arthritis Mother, Sister. Depression Mother, Sister. Diabetes Mellitus Mother, Sister. Heart Disease Sister. Hypertension Mother, Sister. Migraine Headache Mother, Sister. Prostate Cancer Father.  Other Problems Malachy Moan, [place]; 07/11/2016 2:03 PM) Anxiety Disorder Arthritis Cancer Cholelithiasis Congestive Heart Failure Depression Diabetes Mellitus Diverticulosis Enlarged Prostate High blood pressure Hypercholesterolemia Rectal Cancer Ventral Hernia Repair     Review of Systems Malachy Moan RMA; 07/11/2016 2:03 PM) General Present- Fatigue. Not Present- Appetite Loss, Chills, Fever, Night Sweats, Weight Gain and Weight Loss. Skin Present- Non-Healing Wounds. Not Present- Change in Wart/Mole, Dryness, Hives, Jaundice, New Lesions, Rash and Ulcer. HEENT Not Present- Earache, Hearing Loss, Hoarseness, Nose Bleed, Oral Ulcers, Ringing in the Ears, Seasonal Allergies, Sinus Pain, Sore Throat, Visual Disturbances, Wears glasses/contact lenses and Yellow Eyes. Respiratory Not Present- Bloody sputum, Chronic Cough, Difficulty Breathing, Snoring and Wheezing. Breast Not Present- Breast Mass, Breast Pain, Nipple Discharge and Skin Changes. Cardiovascular Present- Shortness of Breath and Swelling of Extremities. Not Present- Chest Pain, Difficulty Breathing Lying Down, Leg Cramps, Palpitations and Rapid Heart Rate. Gastrointestinal Present- Constipation. Not Present- Abdominal Pain, Bloating, Bloody Stool, Change in Bowel Habits, Chronic diarrhea, Difficulty Swallowing, Excessive gas, Gets full quickly at meals, Hemorrhoids, Indigestion, Nausea, Rectal Pain and Vomiting. Male Genitourinary Present- Change in Urinary Stream and Urine Leakage. Not Present- Blood in Urine,  Frequency, Impotence, Nocturia, Painful Urination and Urgency. Musculoskeletal Present- Joint Pain and Joint Stiffness. Not Present- Back Pain, Muscle Pain, Muscle Weakness and Swelling of Extremities. Neurological Present- Weakness. Not Present- Decreased Memory, Fainting, Headaches, Numbness, Seizures, Tingling, Tremor and Trouble walking. Psychiatric Present- Anxiety and Depression. Not Present- Bipolar, Change in Sleep Pattern, Fearful and Frequent crying. Endocrine Not Present- Cold Intolerance, Excessive Hunger,  Hair Changes, Heat Intolerance, Hot flashes and New Diabetes. Hematology Present- Easy Bruising. Not Present- Blood Thinners, Excessive bleeding, Gland problems, HIV and Persistent Infections.  Vitals Malachy Moan RMA; 07/11/2016 2:04 PM) 07/11/2016 2:03 PM Weight: 207.4 lb Height: 69in Body Surface Area: 2.1 m Body Mass Index: 30.63 kg/m  Temp.: 97.74F  Pulse: 57 (Regular)  BP: 138/70 (Sitting, Left Arm, Standard)      Physical Exam Leighton Ruff MD; [id number] 2:39 PM)  General Mental Status-Alert. General Appearance-Not in acute distress. Build & Nutrition-Well nourished. Posture-Normal posture. Gait-Normal.  Head and Neck Head-normocephalic, atraumatic with no lesions or palpable masses. Trachea-midline.  Chest and Lung Exam Chest and lung exam reveals -on auscultation, normal breath sounds, no adventitious sounds and normal vocal resonance.  Cardiovascular Cardiovascular examination reveals -normal heart sounds, regular rate and rhythm with no murmurs.  Abdomen Inspection Inspection of the abdomen reveals - No Hernias. Palpation/Percussion Palpation and Percussion of the abdomen reveal - Soft, Non Tender, No Rigidity (guarding), No hepatosplenomegaly and No Palpable abdominal masses.  Rectal Note: Mass palpated anteriorly on digital rectal exam. Scar/tumor extends to proximal anal sphincters  Neurologic Neurologic  evaluation reveals -alert and oriented x 3 with no impairment of recent or remote memory, normal attention span and ability to concentrate, normal sensation and normal coordination.  Musculoskeletal Normal Exam - Bilateral-Upper Extremity Strength Normal and Lower Extremity Strength Normal.    Assessment & Plan Leighton Ruff MD; [id number] 2:38 PM)  ADENOCARCINOMA (C80.1) Impression: 66 year old male with distal rectal cancer. He is status post radiation and chemotherapy. He seems to be doing well from this. On exam he has tumor that abuts or possibly invades the proximal anterior anal sphincter. I have recommended a robotic abdominal peroneal resection. We will have him be evaluated by the ostomy nurse prior to surgery for ostomy marking. He is also undergoing workup for a left-sided renal lesion. I do not think it would be feasible to undergo simultaneous resections if needed. We will touch base with his cardiologist for clearance prior to surgery.

## 2016-07-13 ENCOUNTER — Telehealth: Payer: Self-pay | Admitting: Internal Medicine

## 2016-07-13 ENCOUNTER — Other Ambulatory Visit: Payer: Self-pay | Admitting: Internal Medicine

## 2016-07-13 ENCOUNTER — Other Ambulatory Visit: Payer: Self-pay | Admitting: *Deleted

## 2016-07-13 ENCOUNTER — Telehealth: Payer: Self-pay | Admitting: *Deleted

## 2016-07-13 MED ORDER — COLCHICINE 0.6 MG PO TABS: ORAL_TABLET | ORAL | 0 refills | Status: DC

## 2016-07-13 NOTE — Patient Outreach (Signed)
[facility]) Care Management  07/13/2016  [name] 09-Aug-1950 [id number]  CSW was able to make contact with patient today to discuss the results of his appointment with Leighton Ruff, General Surgeon, which took place on Monday, February 12th.  Patient admitted that he is still somewhat reeling in shock, not expecting the news he received.  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  Patient was informed that he will be required to wear a colostomy bag for the remainder of his life because once the rectal muscles are cut, his bowels will no longer function properly.  Patient had a very positive attitude about the situation, admitting that it is his sister, Dorr Rasso that is having the most difficult time accepting the news. Patient reported that he will begin preparations for his surgery, scheduled to take place at the end of March. Patient indicated that none of his care providers appear to be concerned about his cancer growing/spreading during that period of time.  Patient's oncology team will continue to monitor the 3 centimeter mass on patient's left kidney.  Patient will purchase colostomy supplies and begin to educate himself on proper use.  Patient was told that he will more than likely be hospitalized for 4-5 days, after the surgical procedure and then he will be required to attend short-term rehabilitative services in a skilled nursing facility for at least two weeks.  Patient will have certain restrictions, such as no heavy lifting for at least two months after the procedure.  Patient and CSW were able to conclude that it would probably be best for Ms. Morin to reside in a skilled nursing facility for respite care during that entire time period, which is roughly 3 months. CSW offered counseling and supportive services to patient, ensuring that patient is aware of  support groups offered at the [facility].  Patient indicated that he may consider support groups at a later time, not really wanting to disclose such information at present.  CSW voiced understanding and explained that CSW will also be available to patient and Ms. Lacosse during this process, as CSW will be making placement arrangements for Ms. Haran in a skilled facility.  CSW agreed to follow-up with patient and Ms. Cressman in a few weeks, when CSW returns to the office from vacation.  However, patient was encouraged to contact the main [facility] office if social work services are needed in the meantime.  Patient was agreeable to this plan.  [name], BSW, MSW, LCSW  Licensed Education officer, environmental Health System  Mailing [place] [address], [place], [place] [address] Physical Address-[address], [place], [place] [address] Toll Free Main # [phone] Fax # [phone] Cell # [phone]  Office # [phone] [email or web address]

## 2016-07-13 NOTE — Telephone Encounter (Signed)
Has bunions on both big toes.  The one on right foot is hurting very badyly.  It is just like it was when he had gout there. It is red and warm. Can something be called in? [facility] on [address]

## 2016-07-13 NOTE — Telephone Encounter (Signed)
Please let Patrick [name] know that I have called in some Colchicine for his gout. He should take 2 tablets (1.2mg ), then 1 tablet (0.6mg ) 1 hour later. If this does not improve his symptoms, he should schedule an appointment to be seen in clinic so that we can make sure nothing else is going on. Thank you!

## 2016-07-13 NOTE — Telephone Encounter (Signed)
Requesting surgical clearance:   1. Type of surgery: Adenocarcinoma  2. Surgeon: Leighton Ruff  3. Surgical date: Pending  4. Medications that need to be help: None   5. [facility]: (P) [phone] (F) [phone]  Pt saw you on 01/26, Is pt cleared for surgery?

## 2016-07-13 NOTE — Telephone Encounter (Signed)
Patient is aware of instructions and will call us back if he needs an appointment. Jazmin Hartsell,CMA

## 2016-07-13 NOTE — Telephone Encounter (Signed)
Will forward to Md to advise. Kareema Keitt,CMA  

## 2016-07-13 NOTE — Progress Notes (Signed)
Patient states he has a gout flare. Would like something called in. Will prescribe Colchicine 1.2mg , then 0.6mg  1 hour later. Manny't want to use NSAIDs with patient's significant cardiac history. If not improved with this treatment, will need to be seen in clinic.  Hyman Bible, MD

## 2016-07-14 ENCOUNTER — Ambulatory Visit: Payer: Medicare Other | Admitting: *Deleted

## 2016-07-17 NOTE — Telephone Encounter (Signed)
He has an appt with Rhonda on 2/26.  This will be the preop visit.

## 2016-07-20 ENCOUNTER — Ambulatory Visit (INDEPENDENT_AMBULATORY_CARE_PROVIDER_SITE_OTHER): Payer: Medicare Other

## 2016-07-20 ENCOUNTER — Encounter: Payer: Self-pay | Admitting: Podiatry

## 2016-07-20 ENCOUNTER — Ambulatory Visit: Payer: Medicare Other | Admitting: Podiatry

## 2016-07-20 VITALS — BP 142/70 | HR 62 | Resp 18

## 2016-07-20 DIAGNOSIS — R52 Pain, unspecified: Secondary | ICD-10-CM

## 2016-07-20 DIAGNOSIS — M722 Plantar fascial fibromatosis: Secondary | ICD-10-CM

## 2016-07-20 DIAGNOSIS — B351 Tinea unguium: Secondary | ICD-10-CM

## 2016-07-20 DIAGNOSIS — M79676 Pain in unspecified toe(s): Secondary | ICD-10-CM

## 2016-07-20 DIAGNOSIS — M2012 Hallux valgus (acquired), left foot: Secondary | ICD-10-CM

## 2016-07-20 NOTE — Patient Instructions (Addendum)
Today your screen demonstrated mild right heel tenderness symptomatic for about 1 day, labeled as heel pain are plantar fasciitis Wear sturdy athletic style shoes and notify us if the pain increases The left bunion demonstrates no warmth or redness. There is no obvious gout episodes at this time if the symptoms persisted and worsened over time please notify us   Plantar Fasciitis Plantar fasciitis is a painful foot condition that affects the heel. It occurs when the band of tissue that connects the toes to the heel bone (plantar fascia) becomes irritated. This can happen after exercising too much or doing other repetitive activities (overuse injury). The pain from plantar fasciitis can range from mild irritation to severe pain that makes it difficult for you to walk or move. The pain is usually worse in the morning or after you have been sitting or lying down for a while. CAUSES This condition may be caused by:  Standing for long periods of time.  Wearing shoes that do not fit.  Doing high-impact activities, including running, aerobics, and ballet.  Being overweight.  Having an abnormal way of walking (gait).  Having tight calf muscles.  Having high arches in your feet.  Starting a new athletic activity. SYMPTOMS The main symptom of this condition is heel pain. Other symptoms include:  Pain that gets worse after activity or exercise.  Pain that is worse in the morning or after resting.  Pain that goes away after you walk for a few minutes. DIAGNOSIS This condition may be diagnosed based on your signs and symptoms. Your health care provider will also do a physical exam to check for:  A tender area on the bottom of your foot.  A high arch in your foot.  Pain when you move your foot.  Difficulty moving your foot. You may also need to have imaging studies to confirm the diagnosis. These can include:  X-rays.  Ultrasound.  MRI. TREATMENT  Treatment for plantar fasciitis  depends on the severity of the condition. Your treatment may include:  Rest, ice, and over-the-counter pain medicines to manage your pain.  Exercises to stretch your calves and your plantar fascia.  A splint that holds your foot in a stretched, upward position while you sleep (night splint).  Physical therapy to relieve symptoms and prevent problems in the future.  Cortisone injections to relieve severe pain.  Extracorporeal shock wave therapy (ESWT) to stimulate damaged plantar fascia with electrical impulses. It is often used as a last resort before surgery.  Surgery, if other treatments have not worked after 12 months. HOME CARE INSTRUCTIONS  Take medicines only as directed by your health care provider.  Avoid activities that cause pain.  Roll the bottom of your foot over a bag of ice or a bottle of cold water. Do this for 20 minutes, 3-4 times a day.  Perform simple stretches as directed by your health care provider.  Try wearing athletic shoes with air-sole or gel-sole cushions or soft shoe inserts.  Wear a night splint while sleeping, if directed by your health care provider.  Keep all follow-up appointments with your health care provider. PREVENTION   Do not perform exercises or activities that cause heel pain.  Consider finding low-impact activities if you continue to have problems.  Lose weight if you need to. The best way to prevent plantar fasciitis is to avoid the activities that aggravate your plantar fascia. SEEK MEDICAL CARE IF:  Your symptoms do not go away after treatment with home care  measures.  Your pain gets worse.  Your pain affects your ability to move or do your daily activities. This information is not intended to replace advice given to you by your health care provider. Make sure you discuss any questions you have with your health care provider. Document Released: 02/08/2001 Document Revised: 09/07/2015 Document Reviewed: 03/26/2014 Elsevier  Interactive Patient Education  2017 [employer].

## 2016-07-20 NOTE — Progress Notes (Signed)
   Subjective:    Patient ID: Patrick [name], male    DOB: 10/13/1950, 66 y.o.   MRN: [id number]  HPI   I am here to get my toenails trimmed and I have some pain on my right heel that has been going on since yesterday and hurts on bottom and my left bunion hurts and has been that way for 3 days and red and swelling     Review of Systems  All other systems reviewed and are negative.      Objective:   Physical Exam        Assessment & Plan:

## 2016-07-22 ENCOUNTER — Other Ambulatory Visit: Payer: Self-pay | Admitting: *Deleted

## 2016-07-22 NOTE — Patient Outreach (Signed)
Acute foot pain. Patrick [name] reports his L ankle, achilles tendon and the first metatarsal head and extremely painful and he is limping. He has had gout in the past but states this is different.  I called in an RX for prednisone 5 mg: 6 tabs day 1, 5 tabs day 2 and so forth.  I will follow up on Tuesday.  Deloria Lair [facility] [phone]

## 2016-07-25 ENCOUNTER — Encounter: Payer: Self-pay | Admitting: Physician Assistant

## 2016-07-25 ENCOUNTER — Other Ambulatory Visit: Payer: Self-pay | Admitting: Physician Assistant

## 2016-07-25 ENCOUNTER — Ambulatory Visit (INDEPENDENT_AMBULATORY_CARE_PROVIDER_SITE_OTHER): Payer: Medicare Other | Admitting: Physician Assistant

## 2016-07-25 VITALS — BP 135/72 | HR 78 | Ht 69.0 in | Wt 199.6 lb

## 2016-07-25 DIAGNOSIS — I4891 Unspecified atrial fibrillation: Secondary | ICD-10-CM

## 2016-07-25 DIAGNOSIS — I38 Endocarditis, valve unspecified: Secondary | ICD-10-CM | POA: Diagnosis not present

## 2016-07-25 DIAGNOSIS — I509 Heart failure, unspecified: Secondary | ICD-10-CM | POA: Diagnosis not present

## 2016-07-25 DIAGNOSIS — I5042 Chronic combined systolic (congestive) and diastolic (congestive) heart failure: Secondary | ICD-10-CM | POA: Diagnosis not present

## 2016-07-25 DIAGNOSIS — I2510 Atherosclerotic heart disease of native coronary artery without angina pectoris: Secondary | ICD-10-CM | POA: Diagnosis not present

## 2016-07-25 DIAGNOSIS — Z01818 Encounter for other preprocedural examination: Secondary | ICD-10-CM

## 2016-07-25 MED ORDER — POTASSIUM CHLORIDE CRYS ER 20 MEQ PO TBCR: 60.0000 meq | EXTENDED_RELEASE_TABLET | Freq: Every day | ORAL | 3 refills | Status: DC

## 2016-07-25 MED ORDER — METOLAZONE 2.5 MG PO TABS: 2.5000 mg | ORAL_TABLET | Freq: Every day | ORAL | 6 refills | Status: DC | PRN

## 2016-07-25 MED ORDER — LOSARTAN POTASSIUM 50 MG PO TABS: 50.0000 mg | ORAL_TABLET | Freq: Every day | ORAL | 6 refills | Status: DC

## 2016-07-25 NOTE — Patient Instructions (Addendum)
Medication Instructions:    1. START TAKING METOLAZONE 2.5 MG  ONCE A  DAY AS NEEDED FOR WEIGHT GAIN OF 3 LBS IN 24 HOURS      5 LB S IN A  WEEK    2.  ONLY WHEN TAKING METOLAZONE MAKE SURE YOU TAKE EXTRA  2 TABLETS OF POTASSIUM WITH IT  3. START TAKING LOSARTAN 50 MG ONCE A DAY   If you need a refill on your cardiac medications before your next appointment, please call your pharmacy.  Labwork: BMET TODAY    Testing/Procedures: NONE ORDERED  TODAY'   Follow-Up: WITH DR [place]  IN 2 TO 3 MONTH S   Any Other Special Instructions Will Be Listed Below (If Applicable).
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
## 2016-07-26 ENCOUNTER — Other Ambulatory Visit: Payer: Self-pay | Admitting: Internal Medicine

## 2016-07-26 LAB — BASIC METABOLIC PANEL
BUN / CREAT RATIO: 22 (ref 10–24)
BUN: 30 mg/dL — AB (ref 8–27)
CHLORIDE: 92 mmol/L — AB (ref 96–106)
CO2: 33 mmol/L — ABNORMAL HIGH (ref 18–29)
Calcium: 9.6 mg/dL (ref 8.6–10.2)
Creatinine, Ser: 1.39 mg/dL — ABNORMAL HIGH (ref 0.76–1.27)
GFR calc non Af Amer: 52 mL/min/{1.73_m2} — ABNORMAL LOW (ref >59)
GFR, EST AFRICAN AMERICAN: 60 mL/min/{1.73_m2} (ref >59)
Glucose: 97 mg/dL (ref 65–99)
Potassium: 3.6 mmol/L (ref 3.5–5.2)
Sodium: 143 mmol/L (ref 134–144)

## 2016-07-28 ENCOUNTER — Ambulatory Visit: Payer: Self-pay | Admitting: Radiation Oncology

## 2016-07-29 ENCOUNTER — Other Ambulatory Visit: Payer: Self-pay | Admitting: *Deleted
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
## 2016-07-29 NOTE — Patient Outreach (Signed)
[facility]) Care Management  07/29/2016  Patrick [name] 12-Aug-1950 [id number]   CSW was able to make contact with patient today to follow-up regarding patient's upcoming surgery schedule, as well as to assess and assist with social work needs and services.  Patient admits that he has been trying to contact his Primary Care Physician, Dr. Valetta Fuller Mayo's office to learn of his upcoming surgery schedule, but has not received a return call.  CSW explained to patient that CSW has spoken with Deloria Lair, Geriatric Nurse Practitioner with [facility], who reports that she has also been trying to find out patient's scheduled surgery date, without success.  Once a date is nailed down, CSW will then be able to pursue short-term respite care placement for patient's sister, Axeton Mure.  Patient admitted that Ms. Hughart is not wanting to return to [facility], as patient has been there in the past and was not happy with the services provided.  CSW offered supportive services to patient, agreeing to continue to follow-up to assist with social work needs and services, as well as pursue placement options for Ms. Mangieri. [name], BSW, MSW, LCSW  Licensed Education officer, environmental Health System  Mailing [place] [address] [place], [place] [address] Physical Address-[address], [place], [place] [address] Toll Free Main # [phone] Fax # [phone] Cell # [phone]  Office # [phone] [email or web address]

## 2016-08-02 ENCOUNTER — Other Ambulatory Visit: Payer: Self-pay | Admitting: *Deleted

## 2016-08-02 NOTE — Patient Outreach (Signed)
Routine home visit.  S:  Patrick [name] is scheduled for his rectal cancer surgery next week, 08/10/16. He is having difficulty coping with the scheduling of the surgery being so soon. He was thinking it would be at the end of the month. I have talked with the surgeons office and found out that if he doesn't have it done then it will have to wait until May and I have encouraged him to consent, which he finally has at this time.  He denies any problems at present except for worrying about his sister who will have to be placed during his surgery and recovery.   O:  BP 110/70 (BP Location: Right Arm, Patient Position: Sitting, Cuff Size: Normal)   Pulse 68   Resp 18   SpO2 96%        RRR       Lungs are clear       Abdomen soft with BS present       No edema  A:   Rectal Cancer otherwise stable  P:  Will continue to support pt throughout his hospitalization and recovery.       Advised he needs to have [name]'s things all ready to go by Thursday evening so she can be ready when a bed is            offered.  Deloria Lair [facility] [phone]

## 2016-08-02 NOTE — Patient Outreach (Signed)
[name]) Care Management  08/02/2016  Patrick [name] 1951/02/24 [id number]   CSW received a call from patient informing CSW of his scheduled surgery date, which will take place on Wednesday, March 14th.  Patient appeared to be a bit anxious, reporting "I thought it would be later in the month, I need more time to prepare".  CSW was able to offer assistance to patient and help him process his feelings of anxiety.  CSW soon learned that a great deal of patient's anxiety was centered around his sister, Patrick [name] and whether or not she would have a safe place to reside while he is hospitalized and undergoing short-term rehabilitative services.  CSW agreed to contact patient and [name] Primary Care Physician, Dr. Hyman Bible to request a signed and updated copy of [name]'s FL-2 Form so that CSW may begin the placement process for patient today.  CSW is hopeful that placement for [name] no later than Monday, March 12th, as patient will need to undergo pre-operative measures, without having to worry about providing 24 hour care and supervision to [name].  [name] is interested in placement at [facility] so [place] will contact Patrick [name] to request an expedited review of patient's FL-2 Form and basic demographic information.  CSW will continue to follow-up with patient and Ms. Husband to report findings. [name], BSW, MSW, LCSW  Licensed Education officer, environmental Health System  Mailing [place] [address] [place], [place] [address] Physical Address-[address], [place], [place] [address] Toll Free Main # [phone] Fax # [phone] Cell # [phone]  Office # [phone] [email or web address]

## 2016-08-04 ENCOUNTER — Ambulatory Visit
Admission: RE | Admit: 2016-08-04 | Discharge: 2016-08-04 | Disposition: A | Discharge status: Home / Self Care | Payer: Medicare Other | Source: Ambulatory Visit | Attending: Radiation Oncology | Admitting: Radiation Oncology

## 2016-08-04 ENCOUNTER — Encounter: Payer: Self-pay | Admitting: Radiation Oncology

## 2016-08-04 VITALS — BP 102/62 | HR 59 | Temp 98.2°F | Resp 18 | Ht 69.0 in | Wt 199.0 lb

## 2016-08-04 DIAGNOSIS — M25561 Pain in right knee: Secondary | ICD-10-CM | POA: Insufficient documentation

## 2016-08-04 DIAGNOSIS — Z79899 Other long term (current) drug therapy: Secondary | ICD-10-CM | POA: Diagnosis not present

## 2016-08-04 DIAGNOSIS — C20 Malignant neoplasm of rectum: Secondary | ICD-10-CM | POA: Insufficient documentation

## 2016-08-04 DIAGNOSIS — Z923 Personal history of irradiation: Secondary | ICD-10-CM | POA: Insufficient documentation

## 2016-08-04 DIAGNOSIS — Z9221 Personal history of antineoplastic chemotherapy: Secondary | ICD-10-CM | POA: Insufficient documentation

## 2016-08-04 NOTE — Patient Instructions (Signed)
Patrick [name]  08/04/2016   Your procedure is scheduled on: 08/10/2016    Report to [facility] Main  Entrance take [place]  elevators to 3rd floor to  [facility] at    [place] AM.  Call this number if you have problems the morning of surgery [phone]   Remember: ONLY 1 PERSON MAY GO WITH YOU TO SHORT STAY TO GET  READY MORNING OF [place].  Do not eat food or drink liquids :After Midnight.              Follow Bowel Prep Instructions per surgeon.               Remember to drink plenty of clear liquids on day of bowel prep to prevent dehydration .       Take these medicines the morning of surgery with A SIP OF WATER: Carvedilol ( coregt0, Proscar, Flomax, Oxycodone if needed                                 You may not have any metal on your body including hair pins and              piercings  Do not wear jewelry,  lotions, powders or perfumes, deodorant                    Men may shave face and neck.   Do not bring valuables to the hospital. [place].  Contacts, dentures or bridgework may not be worn into surgery.  Leave suitcase in the car. After surgery it may be brought to your room.   Marland Kitchen  Special Instructions: coughing and deep breathing exercises, leg exercises               Please read over the following fact sheets you were given: _____________________________________________________________________                CLEAR LIQUID DIET   Foods Allowed                                                                     Foods Excluded  Coffee and tea, regular and decaf                             liquids that you cannot  Plain Jell-O in any flavor                                             see through such as: Fruit ices (not with fruit pulp)                                     milk, soups, orange juice  Iced Popsicles  All solid food Carbonated beverages,  regular and diet                                    Cranberry, grape and apple juices Sports drinks like Gatorade Lightly seasoned clear broth or consume(fat free) Sugar, honey syrup  Sample Menu Breakfast                                Lunch                                     Supper Cranberry juice                    Beef broth                            Chicken broth Jell-O                                     Grape juice                           Apple juice Coffee or tea                        Jell-O                                      Popsicle                                                Coffee or tea                        Coffee or tea  _____________________________________________________________________  [facility] - Preparing for Surgery Before surgery, you can play an important role.  Because skin is not sterile, your skin needs to be as free of germs as possible.  You can reduce the number of germs on your skin by washing with CHG (chlorahexidine gluconate) soap before surgery.  CHG is an antiseptic cleaner which kills germs and bonds with the skin to continue killing germs even after washing. Please DO NOT use if you have an allergy to CHG or antibacterial soaps.  If your skin becomes reddened/irritated stop using the CHG and inform your nurse when you arrive at Short Stay. Do not shave (including legs and underarms) for at least 48 hours prior to the first CHG shower.  You may shave your face/neck. Please follow these instructions carefully:  1.  Shower with CHG Soap the night before surgery and the  morning of Surgery.  2.  If you choose to wash your hair, wash your hair first as usual with your  normal  shampoo.  3.  After you shampoo, rinse your hair and body thoroughly to remove the  shampoo.  4.  Use CHG as you would any other liquid soap.  You can apply chg directly  to the skin and wash                       Gently with a scrungie or clean  washcloth.  5.  Apply the CHG Soap to your body ONLY FROM THE NECK DOWN.   Do not use on face/ open                           Wound or open sores. Avoid contact with eyes, ears mouth and genitals (private parts).                       Wash face,  Genitals (private parts) with your normal soap.             6.  Wash thoroughly, paying special attention to the area where your surgery  will be performed.  7.  Thoroughly rinse your body with warm water from the neck down.  8.  DO NOT shower/wash with your normal soap after using and rinsing off  the CHG Soap.                9.  Pat yourself dry with a clean towel.            10.  Wear clean pajamas.            11.  Place clean sheets on your bed the night of your first shower and do not  sleep with pets. Day of Surgery : Do not apply any lotions/deodorants the morning of surgery.  Please wear clean clothes to the hospital/surgery center.  FAILURE TO FOLLOW THESE INSTRUCTIONS MAY RESULT IN THE CANCELLATION OF YOUR SURGERY PATIENT SIGNATURE_________________________________  NURSE SIGNATURE__________________________________  ________________________________________________________________________  WHAT IS A BLOOD TRANSFUSION? Blood Transfusion Information  A transfusion is the replacement of blood or some of its parts. Blood is made up of multiple cells which provide different functions.  Red blood cells carry oxygen and are used for blood loss replacement.  White blood cells fight against infection.  Platelets control bleeding.  Plasma helps clot blood.  Other blood products are available for specialized needs, such as hemophilia or other clotting disorders. BEFORE THE TRANSFUSION  Who gives blood for transfusions?   Healthy volunteers who are fully evaluated to make sure their blood is safe. This is blood bank blood. Transfusion therapy is the safest it has ever been in the practice of medicine. Before blood is taken from a donor, a  complete history is taken to make sure that person has no history of diseases nor engages in risky social behavior (examples are intravenous drug use or sexual activity with multiple partners). The donor's travel history is screened to minimize risk of transmitting infections, such as malaria. The donated blood is tested for signs of infectious diseases, such as HIV and hepatitis. The blood is then tested to be sure it is compatible with you in order to minimize the chance of a transfusion reaction. If you or a relative donates blood, this is often done in anticipation of surgery and is not appropriate for emergency situations. It takes many days to process the donated blood. RISKS AND COMPLICATIONS Although transfusion therapy is very safe and saves many lives, the main dangers of transfusion include:   Getting an infectious disease.  Developing a transfusion reaction.  This is an allergic reaction to something in the blood you were given. Every precaution is taken to prevent this. The decision to have a blood transfusion has been considered carefully by your caregiver before blood is given. Blood is not given unless the benefits outweigh the risks. AFTER THE TRANSFUSION  Right after receiving a blood transfusion, you will usually feel much better and more energetic. This is especially true if your red blood cells have gotten low (anemic). The transfusion raises the level of the red blood cells which carry oxygen, and this usually causes an energy increase.  The nurse administering the transfusion will monitor you carefully for complications. HOME CARE INSTRUCTIONS  No special instructions are needed after a transfusion. You may find your energy is better. Speak with your caregiver about any limitations on activity for underlying diseases you may have. SEEK MEDICAL CARE IF:   Your condition is not improving after your transfusion.  You develop redness or irritation at the intravenous (IV)  site. SEEK IMMEDIATE MEDICAL CARE IF:  Any of the following symptoms occur over the next 12 hours:  Shaking chills.  You have a temperature by mouth above 102 F (38.9 C), not controlled by medicine.  Chest, back, or muscle pain.  People around you feel you are not acting correctly or are confused.  Shortness of breath or difficulty breathing.  Dizziness and fainting.  You get a rash or develop hives.  You have a decrease in urine output.  Your urine turns a dark color or changes to pink, red, or brown. Any of the following symptoms occur over the next 10 days:  You have a temperature by mouth above 102 F (38.9 C), not controlled by medicine.  Shortness of breath.  Weakness after normal activity.  The white part of the eye turns yellow (jaundice).  You have a decrease in the amount of urine or are urinating less often.  Your urine turns a dark color or changes to pink, red, or brown. Document Released: 05/13/2000 Document Revised: 08/08/2011 Document Reviewed: 12/31/2007 [facility] Patient Information 2014 [place], [place].  _______________________________________________________________________

## 2016-08-07 NOTE — Progress Notes (Signed)
Radiation Oncology         ([phone] ________________________________  Name: Patrick [name] MRN: [id number]  Date: 08/04/2016  DOB: Dec 30, 1950  Post Treatment Note  CC: Evette Doffing, MD  Leighton Ruff, MD  Diagnosis:  Stage IIIA, adenocarcinoma of the distal rectum.  Interval Since Last Radiation:  5 weeks   05/12/16 - 06/28/16: The Rectum was treated to 45 Gy in 25 fractions, and then Boosted an additional 5.4 Gy in 3 fractions  Narrative:  The patient returns today for routine follow-up. He's scheduled for robotic APR with Dr. Marcello Moores on 08/10/16. He tolerated radiotherapy well without incident.  On review of systems, the patient states he's nervous about surgery, and trying to come to terms with the idea of having a colostomy bag. He continues to have right knee pain and takes oxycodone and tylenol for this. He reports his bowels are moving well without difficulty. He denies any rectal bleeding, nausea, vomiting, chest pain, or shortness of breath. No other complaints are noted.   ALLERGIES:  has No Known Allergies.  Meds: Current Outpatient Prescriptions  Medication Sig Dispense Refill  . acetaminophen (TYLENOL) 500 MG tablet Take 1,000 mg by mouth every 6 (six) hours as needed (for pain/fever/headaches.).     Marland Kitchen Ascorbic Acid (VITAMIN C) 1000 MG tablet Take 2,000 mg by mouth 2 (two) times daily.    . carvedilol (COREG) 25 MG tablet Take 1 tablet (25 mg total) by mouth 2 (two) times daily. 180 tablet 3  . ferrous sulfate 325 (65 FE) MG tablet take 1 tablet by mouth once daily WITH BREAKFAST 30 tablet 3  . finasteride (PROSCAR) 5 MG tablet take 1 tablet by mouth once daily 90 tablet 1  . losartan (COZAAR) 25 MG tablet Take 25 mg by mouth at bedtime.    . lovastatin (MEVACOR) 20 MG tablet take 1 tablet by mouth at bedtime 90 tablet 1  . metolazone (ZAROXOLYN) 2.5 MG tablet Take 1 tablet (2.5 mg total) by mouth daily as needed (WEIGHT GAIN 3LBS OR MORE IN 24 HOURS). Weight gain 30 tablet  6  . mirtazapine (REMERON SOL-TAB) 15 MG disintegrating tablet place 1 tablet ON TONGUE at bedtime 90 tablet 0  . Multiple Vitamins-Minerals (MULTIVITAMIN WITH MINERALS) tablet Take 1 tablet by mouth daily. Men's 50+ Multivitamin    . omega-3 acid ethyl esters (LOVAZA) 1 g capsule take 2 capsules by mouth twice a day 360 capsule 1  . oxyCODONE-acetaminophen (PERCOCET/ROXICET) 5-325 MG tablet Take 1 tablet by mouth every 8 (eight) hours as needed for severe pain. 30 tablet 0  . potassium chloride SA (K-DUR,KLOR-CON) 20 MEQ tablet Take 3 tablets (60 mEq total) by mouth daily. TAKE AN  2 EXTRA TABLETS WHEN TAKING METOLAZONE 2.5 MG AS NEEDED 286 tablet 3  . tamsulosin (FLOMAX) 0.4 MG CAPS capsule Take 0.4 mg by mouth daily.     Marland Kitchen torsemide (DEMADEX) 20 MG tablet Take 40-80 mg by mouth 2 (two) times daily. 80 mg in the morning  & 40 mg at night    . traZODone (DESYREL) 100 MG tablet Take 100 mg by mouth 2 (two) times daily.  0  . metroNIDAZOLE (FLAGYL) 500 MG tablet Take 1,000 mg by mouth 3 (three) times daily.     Marland Kitchen neomycin (MYCIFRADIN) 500 MG tablet Take 1,000 mg by mouth 3 (three) times daily.     . nitroGLYCERIN (NITROSTAT) 0.4 MG SL tablet Place 1 tablet (0.4 mg total) under the tongue every 5 (  five) minutes as needed for chest pain. (Patient not taking: Reported on 08/04/2016) 25 tablet 3  . prochlorperazine (COMPAZINE) 10 MG tablet take 1 tablet by mouth every 6 hours if needed for nausea and vomiting  0   No current facility-administered medications for this encounter.     Physical Findings:  height is 5\' 9"  (1.753 m) and weight is 199 lb (90.3 kg). His oral temperature is 98.2 F (36.8 C). His blood pressure is 102/62 and his pulse is 59 (abnormal). His respiration is 18 and oxygen saturation is 97%.  Pain Assessment Pain Score: 7  (Right knee)/10 In general this is a well appearing caucasian male in no acute distress. He's alert and oriented x4 and appropriate throughout the examination.  Cardiopulmonary assessment is negative for acute distress and he exhibits normal effort.   Lab Findings: Lab Results  Component Value Date   WBC 4.4 07/04/2016   HGB 10.5 (L) 07/04/2016   HCT 32.7 (L) 07/04/2016   MCV 88.9 07/04/2016   PLT 102 (L) 07/04/2016     Radiographic Findings: No results found.  Impression/Plan: 1. Stage IIIA, adenocarcinoma of the distal rectum. The patient has done well since completion of radiotherpay and chemo. He will move on to surgical resection and is scheduled on 08/10/16 with Dr. Marcello Moores for robotic APR. We will see him back as needed moving forward. He will see Dr. Burr Medico as well postoperatively, and will begin surveillance under her management.     Carola Rhine, PAC

## 2016-08-08 ENCOUNTER — Inpatient Hospital Stay (HOSPITAL_COMMUNITY)
Admission: RE | Admit: 2016-08-08 | Discharge: 2016-08-08 | Disposition: A | Discharge status: Home / Self Care | Payer: Medicare Other | Source: Ambulatory Visit

## 2016-08-08 ENCOUNTER — Other Ambulatory Visit: Payer: Self-pay | Admitting: *Deleted

## 2016-08-08 NOTE — Patient Outreach (Signed)
Telephone call from pt's sister, advising me that he has cancelled his surgery. He says he just hasn't had enough time to get ready (he has been waiting to be informed of surgery for a month and had a 10 day notice when the surgeons office notified him. He is also worried that [place], is still waiting placement.  I will be seeing him tomorrow as I have a visit scheduled to see his sister. We will discuss the plan of care then.  Deloria Lair [facility] [phone]

## 2016-08-08 NOTE — Progress Notes (Signed)
Left VM with [facility] scheduler that patient called and cancelled his preop apt. And said he was not going to be able to have his surgery on 3/14

## 2016-08-09 ENCOUNTER — Other Ambulatory Visit: Payer: Self-pay | Admitting: *Deleted

## 2016-08-09 NOTE — Patient Outreach (Signed)
[facility]) Care Management  08/09/2016  GLENROY CROSSEN 08/16/1950 [id number]  CSW was able to make contact with patient and patient's sister, Abhimanyu Cruces today to follow-up regarding short-term skilled nursing placement for Ms. Garlitz to receive rehabilitative services.  CSW explained that no bed offers have been received, thus far, but that CSW plans to re-fax patient's FL-2 Form to all skilled nursing facilities within a 50-mile radius, with the exception of [place], [place] and [facility], per patient's request.  Present during the phone conversation with patient and Ms. Allender was Deloria Lair, Geriatric Nurse Practitioner with [facility].  Patient admitted to cancelling his surgical procedure, scheduled for Wednesday, March 14th, out of concern for MS. Dasher not having 24 hour care and supervision in place.  Patient agreed to reschedule his surgery, as soon as Ms. Helms is securely placed in a skilled nursing facility, of her choice.  In the meantime, patient agreed to locate a new surgeon, as well as reschedule his surgery date.  Patient denied having any additional social work needs at present, encouraging CSW to continue to follow-up with Ms. Nosbisch. CSW will perform a case closure on patient, as all goals of treatment have been met from social work standpoint and no additional social work needs have been identified at this time.  CSW will notify patient's Geriatric Nurse Practitioner, Deloria Lair with [facility], of CSW's plans to close patient's case.  CSW will fax an update to patient's Primary Care Physician, Dr. Hyman Bible to ensure that they are aware of CSW's involvement with patient's plan of care.  CSW will submit a case closure request to Josepha Pigg, Care Management Assistant with [facility]  [facility], in the form of an [employer].  CSW will ensure that Mrs. Quentin Cornwall is aware of [name], Geriatric Nurse Practitioner with [facility], continued involvement with patient's care.  [name], BSW, MSW, LCSW  Licensed Education officer, environmental Health System  Mailing [place] [address]. [address], [place], [place] [address] Physical Address-[address], [place], [place] [address] Toll Free Main # [phone] Fax # [phone] Cell # [phone]  Office # [phone] [email or web address]

## 2016-08-10 ENCOUNTER — Inpatient Hospital Stay (HOSPITAL_COMMUNITY): Admission: RE | Admit: 2016-08-10 | Payer: Medicare Other | Source: Ambulatory Visit | Admitting: General Surgery

## 2016-08-10 ENCOUNTER — Encounter (HOSPITAL_COMMUNITY): Admission: RE | Payer: Self-pay | Source: Ambulatory Visit

## 2016-08-10 SURGERY — RESECTION, RECTUM, LOW ANTERIOR, ROBOT-ASSISTED
Anesthesia: General

## 2016-08-11 ENCOUNTER — Telehealth: Payer: Self-pay | Admitting: *Deleted

## 2016-08-11 NOTE — Telephone Encounter (Signed)
I have called him back and referred him to Dr. Morton Stall at [facility]. I spoke with his case manager and was informed that both pt and his sister have been accepted to [facility] when he knows the date of his surgery.    Truitt Merle MD

## 2016-08-11 NOTE — Telephone Encounter (Signed)
Pt called and left message re:  Pt is still waiting to hear from Dr. Burr Medico for a referral to a new surgeon.  Stated his current surgeon had dismissed pt.  Pt would like to know what the next plan of care is . Pt's   Phone    [phone].

## 2016-08-12 ENCOUNTER — Telehealth: Payer: Self-pay | Admitting: Hematology

## 2016-08-12 ENCOUNTER — Ambulatory Visit: Payer: Medicare Other | Admitting: *Deleted

## 2016-08-12 NOTE — Telephone Encounter (Signed)
PT APPT WITH DR WATERS IS 08/25/16@9 :00.  MEDICAL RECORDS FAXED ERIC, THE SCHEDULER FROM DR WATERS WILL EMAIL THE DR TO SEE IF THEY CAN OVERBOOK SO THE PT CAN BE SEEN SOONER. PT IS AWARE

## 2016-08-15 ENCOUNTER — Other Ambulatory Visit: Payer: Self-pay | Admitting: *Deleted

## 2016-08-15 NOTE — Patient Outreach (Signed)
[facility]) Care Management  08/15/2016  JAYDRIEN WASSENAAR [birth year] [id number]   CSW was able to make contact with patient today, in response to a voicemail message that patient left for CSW late Friday afternoon regarding his surgery date and location.  Patient was quite upset that his surgery was not scheduled until the end of the month, for fear that he and his sister, Merric Yost would lose their bed offers at [facility].  Patient also voiced concern about not having his surgery within the [place], feeling as though he is being retaliate against for being sent to [facility] to have his surgery performed.  CSW agreed to check into the situation and will follow-up with patient as soon as possible to report findings. [name], BSW, MSW, LCSW  Licensed Education officer, environmental Health System  Mailing [place] [address]. [address] [place], [place] [address] Physical Address-[address], [place], [place] [address] Toll Free Main # [phone] Fax # [phone] Cell # [phone]  Office # [phone] [email or web address]

## 2016-08-17 ENCOUNTER — Telehealth: Payer: Self-pay | Admitting: Hematology

## 2016-08-17 NOTE — Telephone Encounter (Signed)
I called the patient to see if he is planning to meet Dr. Morton Stall at [facility] next week. He states he is not happy because no colorectal surgeon in [place] will operate on him, but he is planning to keep his appointment with Dr. Morton Stall next week. I encouraged him to call my office after his surgery and I'll set up his follow-up with [place].  Patrick [name]  08/17/2016

## 2016-08-18 ENCOUNTER — Ambulatory Visit: Payer: Self-pay | Admitting: Surgery

## 2016-08-29 ENCOUNTER — Other Ambulatory Visit: Payer: Self-pay | Admitting: Internal Medicine

## 2016-09-02 ENCOUNTER — Encounter: Payer: Self-pay | Admitting: Internal Medicine

## 2016-09-02 ENCOUNTER — Ambulatory Visit (INDEPENDENT_AMBULATORY_CARE_PROVIDER_SITE_OTHER): Payer: Medicare Other | Admitting: Internal Medicine

## 2016-09-02 DIAGNOSIS — I5022 Chronic systolic (congestive) heart failure: Secondary | ICD-10-CM

## 2016-09-02 DIAGNOSIS — I10 Essential (primary) hypertension: Secondary | ICD-10-CM | POA: Diagnosis not present

## 2016-09-02 NOTE — Patient Instructions (Signed)
Everything looks great today!  I will be thinking of your during your upcoming surgery. Let me know if I can be of any assistance.  We will see you back in 6 months!  -Dr. Brett Albino

## 2016-09-02 NOTE — Progress Notes (Signed)
   [facility] Phone: [phone]  Subjective:  HFrEF: Weights have been up and down. Normally 193-197. Usually up towards the end of the week. Torsemide 80mg  in the morning and 40mg  at night. Also taking Metolazone once per week. Watching his salt intake. No orthopnea or PND.  HTN: Taking Coreg and Losartan. No side effects to medications. No chest pain, no shortness of breath.  ROS: See HPI for pertinent positives and negatives  Past Medical History- HTN, HFrEF 2/2 ischemic cardiomyopathy, active adenocarcinoma of the rectum, HLD, CKD III, major depressive disorder  Family history reviewed for today's visit. No changes.  Social history- patient is a former smoker  Objective: BP 102/62   Pulse 60   Temp 97.7 F (36.5 C) (Oral)   Wt 203 lb (92.1 kg)   SpO2 99%   BMI 29.98 kg/m  Gen: NAD, alert, cooperative with exam HEENT: NCAT, EOMI, MMM Neck: FROM, supple, no JVD CV: RRR, no murmur Resp: CTABL, no wheezes, normal work of breathing Msk: No edema  Assessment/Plan: HFrEF: Stable. Last ECHO 05/20/16 with EF 30-35%. Weights have been between 193-197. No signs of fluid overload on exam. - Continue Torsemide 80mg  in the am and 40mg  in the pm, Metolazone 2.5mg  once weekly, Coreg 25mg  bid, Cozaar 25mg  qhs, and K-dur 36mEq daily. - Follow-up in 6 months  HTN: Well-controlled. BP 102/62 today. - Continue Cozaar 25ng qhs and Coreg 25mg  bid - Follow-up in 6 months   Hyman Bible, MD PGY-2

## 2016-09-05 ENCOUNTER — Telehealth: Payer: Self-pay | Admitting: Internal Medicine

## 2016-09-05 NOTE — Assessment & Plan Note (Signed)
Stable. Last ECHO 05/20/16 with EF 30-35%. Weights have been between 193-197. No signs of fluid overload on exam. - Continue Torsemide 80mg  in the am and 40mg  in the pm, Metolazone 2.5mg  once weekly, Coreg 25mg  bid, Cozaar 25mg  qhs, and K-dur 14mEq daily. - Follow-up in 6 months

## 2016-09-05 NOTE — Telephone Encounter (Signed)
Pt called because his Trazodone is not working and he is not sleeping at all. He would like to increase the dosage. jw

## 2016-09-05 NOTE — Assessment & Plan Note (Signed)
Well-controlled. BP 102/62 today. - Continue Cozaar 25ng qhs and Coreg 25mg  bid - Follow-up in 6 months

## 2016-09-05 NOTE — Telephone Encounter (Signed)
Will forward to MD to advise. Jazmin Hartsell,CMA  

## 2016-09-06 ENCOUNTER — Other Ambulatory Visit: Payer: Self-pay | Admitting: *Deleted

## 2016-09-06 NOTE — Patient Outreach (Signed)
Routine home visit.  S: Pt states he has been not feeling very well. Yesterday he felt aching all over and had a HA. He took his prn pain medication (APAP).  Pt to go for his surgical consult this week. Surgery scheduled for May 2nd.  O:  .BP 120/70 (BP Location: Right Arm, Patient Position: Sitting, Cuff Size: Normal)   Pulse 78   Resp 16   Wt 197 lb (89.4 kg)   SpO2 97%   BMI 29.09 kg/m         Heart:  Irregular        Lungs:  Clear        Extremities: no edema  A:  Rectal Adenocarcinoma  P:  Encouraged pt to call me if he has further complaints.  Deloria Lair [facility] [place] [phone]

## 2016-09-06 NOTE — Patient Outreach (Signed)
Pt also reports insomnia, he does not nap, or watch TV in bed. He does drink [place] and I have asked him not to drink any caffeinated beverages after lunch. [place] has called and left a message for Dr. Brett Albino, asking for something to help him sleep.  Deloria Lair [facility] [phone]

## 2016-09-06 NOTE — Telephone Encounter (Signed)
Per provider patient may take 100mg  in the morning and 200mg  in the evening. Spoke with patient and he states that he has been taking both of his pills (all 200mg ) at night only.  Gave patient instructions from MD and asked him to call us on Friday morning to see if this has helped at all.  Patient voiced understanding. Jazmin Hartsell,CMA

## 2016-09-08 ENCOUNTER — Ambulatory Visit: Payer: Self-pay | Admitting: Surgery

## 2016-09-08 DIAGNOSIS — C20 Malignant neoplasm of rectum: Secondary | ICD-10-CM | POA: Diagnosis not present

## 2016-09-08 DIAGNOSIS — N289 Disorder of kidney and ureter, unspecified: Secondary | ICD-10-CM | POA: Diagnosis not present

## 2016-09-08 DIAGNOSIS — Z01818 Encounter for other preprocedural examination: Secondary | ICD-10-CM | POA: Diagnosis not present

## 2016-09-08 NOTE — H&P (Signed)
RUFINO STAUP 09/08/2016 12:44 PM Location: [facility] Patient #: [id number] DOB: 08-Jul-1950 Single / Language: Cleophus Molt / Race: White Male  History of Present Illness Adin Hector MD; 09/08/2016 1:53 PM) The patient is a 66 year old male who presents with colorectal cancer. Note for "Colorectal cancer": ` ` ` Patient returns for second opinion on very low rectal cancer.  66 year old male found to have worsening bleeding and bowel changes. Found to have a very distal rectal cancer. MRI showing invasion or at least abutment to the sphincters. Underwent neoadjuvant urination therapy. Tentative plan was abdominal perineal resection. Patient had issues with very poor follow-up and missing clinic visits. Was fired by prior Psychologist, sport and exercise. Patient noted he is having some family crisis is taking care of his parents. He says that center better controlled wishes to readdress. There was discussion about going to [facility], but he wished to reconsider being with our group. I was willing to see him.  Patient notes that he is having loose bowel movements 2 or 3 times a day. Often has accidents and and fecal incontinence with this. He is has less bleeding. No fevers chills or sweats. He was seen by cardiology. Moderate risk with his history of heart failure but not prohibitive. He is not on blood thinners. He claims he can walk a half hour without any difficulty. He did have an MRI to follow-up on renal lesion which was concerning on the left side. There is discussion about urology evaluation. That has not happened yet. Concern of possible cirrhosis. Seems borderline to me. Patient denies any alcohol use. Denies any liver problems or hepatitis in the past. Patient comes in today by himself. Denies any fevers or chills. He was hoping to avoid a colostomy but understands the importance of need for negative margins and has come to terms with it. Patient does not smoke. He  is not a diabetic. He notes he has less issues of urination. Less urgency and frequency. Better flow.  No personal nor family history of inflammatory bowel disease, irritable bowel syndrome, allergy such as Celiac Sprue, dietary/dairy problems, colitis, ulcers nor gastritis. No recent sick contacts/gastroenteritis. No travel outside the country. No changes in diet. No dysphagia to solids or liquids. No significant heartburn or reflux. No hematochezia, hematemesis, coffee ground emesis. No evidence of prior gastric/peptic ulceration.   Allergies (Yasmin Izola Price, CMA; 09/08/2016 12:44 PM) No Known Allergies 03/21/2016 Allergies Reconciled  Medication History (Yasmin Roberson, CMA; 09/08/2016 12:45 PM) Neomycin Sulfate (500MG  Tablet, 2 (two) Tablet Oral SEE NOTE, Taken starting 07/11/2016) Active. (TAKE TWO TABLETS AT 2 PM, 3 PM, AND 10 PM THE DAY PRIOR TO SURGERY) Flagyl (500MG  Tablet, 2 (two) Tablet Oral SEE NOTE, Taken starting 07/11/2016) Active. (Take at 2pm, 3pm, and 10pm the day prior to your colon operation) Carvedilol (25MG  Tablet, Oral) Active. Ferrous Sulfate (325 (65 Fe)MG Tablet, Oral) Active. Finasteride (5MG  Tablet, Oral) Active. Losartan Potassium (50MG  Tablet, Oral) Active. Lovastatin (20MG  Tablet, Oral) Active. Mirtazapine (15MG  Tablet Disint, Oral) Active. Omega-3-acid Ethyl Esters (1GM Capsule, Oral) Active. Potassium Chloride Crys ER (20MEQ Tablet ER, Oral) Active. Tamsulosin HCl (0.4MG  Capsule, Oral) Active. Torsemide (20MG  Tablet, Oral) Active. TraZODone HCl (100MG  Tablet, Oral) Active. Vitamin C (1000MG  Tablet, Oral) Active. Aspirin (81MG  Tablet DR, Oral) Active. Multivitamin Adult (Oral) Active. Oxycodone-Acetaminophen (5-325MG  Tablet, Oral) Active. Medications Reconciled     Review of Systems Adin Hector MD; 09/08/2016 1:53 PM) General Present- Fatigue. Not Present- Appetite Loss, Chills, Fever, Night Sweats, Weight  Gain and  Weight Loss. Skin Present- Non-Healing Wounds. Not Present- Change in Wart/Mole, Dryness, Hives, Jaundice, New Lesions, Rash and Ulcer. HEENT Not Present- Earache, Hearing Loss, Hoarseness, Nose Bleed, Oral Ulcers, Ringing in the Ears, Seasonal Allergies, Sinus Pain, Sore Throat, Visual Disturbances, Wears glasses/contact lenses and Yellow Eyes. Respiratory Not Present- Bloody sputum, Chronic Cough, Difficulty Breathing, Snoring and Wheezing. Breast Not Present- Breast Mass, Breast Pain, Nipple Discharge and Skin Changes. Cardiovascular Present- Shortness of Breath and Swelling of Extremities. Not Present- Chest Pain, Difficulty Breathing Lying Down, Leg Cramps, Palpitations and Rapid Heart Rate. Gastrointestinal Present- Constipation. Not Present- Abdominal Pain, Bloating, Bloody Stool, Change in Bowel Habits, Chronic diarrhea, Difficulty Swallowing, Excessive gas, Gets full quickly at meals, Hemorrhoids, Indigestion, Nausea, Rectal Pain and Vomiting. Male Genitourinary Present- Change in Urinary Stream and Urine Leakage. Not Present- Blood in Urine, Frequency, Impotence, Nocturia, Painful Urination and Urgency. Musculoskeletal Present- Joint Pain and Joint Stiffness. Not Present- Back Pain, Muscle Pain, Muscle Weakness and Swelling of Extremities. Neurological Present- Weakness. Not Present- Decreased Memory, Fainting, Headaches, Numbness, Seizures, Tingling, Tremor and Trouble walking. Psychiatric Present- Anxiety and Depression. Not Present- Bipolar, Change in Sleep Pattern, Fearful and Frequent crying. Endocrine Not Present- Cold Intolerance, Excessive Hunger, Hair Changes, Heat Intolerance and New Diabetes. Hematology Present- Easy Bruising. Not Present- Blood Thinners, Excessive bleeding, Gland problems, HIV and Persistent Infections. All other systems negative  Vitals (Yasmin Roberson CMA; 09/08/2016 12:44 PM) 09/08/2016 12:44 PM Weight: 202.2 lb Height: 69in Body Surface Area: 2.08 m  Body Mass Index: 29.86 kg/m  Temp.: 97.48F  Pulse: 73 (Regular)  BP: 116/68 (Sitting, Left Arm, Standard)      Physical Exam Adin Hector MD; 09/08/2016 1:54 PM)  General Mental Status-Alert. General Appearance-Not in acute distress, Not Sickly. Orientation-Oriented X3. Hydration-Well hydrated. Voice-Normal. Note: Calm. Pleasant. Hygiene fair. Speaks mildly slowly  Integumentary Global Assessment Upon inspection and palpation of skin surfaces of the - Axillae: non-tender, no inflammation or ulceration, no drainage. and Distribution of scalp and body hair is normal. General Characteristics Temperature - normal warmth is noted.  Head and Neck Head-normocephalic, atraumatic with no lesions or palpable masses. Face Global Assessment - atraumatic, no absence of expression. Neck Global Assessment - no abnormal movements, no bruit auscultated on the right, no bruit auscultated on the left, no decreased range of motion, non-tender. Trachea-midline. Thyroid Gland Characteristics - non-tender.  Eye Eyeball - Left-Extraocular movements intact, No Nystagmus. Eyeball - Right-Extraocular movements intact, No Nystagmus. Cornea - Left-No Hazy. Cornea - Right-No Hazy. Sclera/Conjunctiva - Left-No scleral icterus, No Discharge. Sclera/Conjunctiva - Right-No scleral icterus, No Discharge. Pupil - Left-Direct reaction to light normal. Pupil - Right-Direct reaction to light normal.  ENMT Ears Pinna - Left - no drainage observed, no generalized tenderness observed. Right - no drainage observed, no generalized tenderness observed. Nose and Sinuses External Inspection of the Nose - no destructive lesion observed. Inspection of the nares - Left - quiet respiration. Right - quiet respiration. Mouth and Throat Lips - Upper Lip - no fissures observed, no pallor noted. Lower Lip - no fissures observed, no pallor noted. Nasopharynx - no discharge  present. Oral Cavity/Oropharynx - Tongue - no dryness observed. Oral Mucosa - no cyanosis observed. Hypopharynx - no evidence of airway distress observed.  Chest and Lung Exam Inspection Movements - Normal and Symmetrical. Accessory muscles - No use of accessory muscles in breathing. Palpation Palpation of the chest reveals - Non-tender. Auscultation Breath sounds - Normal and Clear.  Cardiovascular Auscultation Rhythm -  Regular. Murmurs & Other Heart Sounds - Auscultation of the heart reveals - No Murmurs and No Systolic Clicks.  Abdomen Inspection Inspection of the abdomen reveals - No Visible peristalsis and No Abnormal pulsations. Umbilicus - No Bleeding, No Urine drainage. Palpation/Percussion Palpation and Percussion of the abdomen reveal - Soft, Non Tender, No Rebound tenderness, No Rigidity (guarding) and No Cutaneous hyperesthesia. Note: Abdomen soft. Nontender, nondistended. No guarding. Moderate diastases. Subtle impulse felt supraumbilically but no definite hernia felt  Male Genitourinary Sexual Maturity Tanner 5 - Adult hair pattern and Adult penile size and shape. Note: No inguinal hernias. Normal external genitalia. Epididymi, testes, and spermatic cords normal without any masses. No definite incision seen. Patient claims he had a prior inguinal hernia repair done.  Rectal Note: Nearly absent sphincter tone. Anterior midline rectal mass mobile. Most distal aspect is at least within 1 cm of the sphincters. Thick and hard but a little more mobile off the prostate. Posterior circumference not involved.  Perianal skin clean with fair hygiene. Leaking stool easily. No chronic skin changes or pruritus ani. No pilonidal disease. No fissure. No abscess/fistula. Normal sphincter tone. No external hemorrhoids. No condyloma warts. Tolerates digital rectal exam. Hemorrhoidal piles normal.  Peripheral Vascular Upper Extremity Inspection - Left - No Cyanotic nailbeds, Not  Ischemic. Right - No Cyanotic nailbeds, Not Ischemic.  Neurologic Neurologic evaluation reveals -normal attention span and ability to concentrate, able to name objects and repeat phrases. Appropriate fund of knowledge , normal sensation and normal coordination. Mental Status Affect - not angry, not paranoid. Cranial Nerves-Normal Bilaterally. Gait-Normal.  Neuropsychiatric Mental status exam performed with findings of-able to articulate well with normal speech/language, rate, volume and coherence, thought content normal with ability to perform basic computations and apply abstract reasoning and no evidence of hallucinations, delusions, obsessions or homicidal/suicidal ideation.  Musculoskeletal Global Assessment Spine, Ribs and Pelvis - no instability, subluxation or laxity. Right Upper Extremity - no instability, subluxation or laxity.  Lymphatic Head & Neck  General Head & Neck Lymphatics: Bilateral - Description - No Localized lymphadenopathy. Axillary  General Axillary Region: Bilateral - Description - No Localized lymphadenopathy. Femoral & Inguinal  Generalized Femoral & Inguinal Lymphatics: Left - Description - No Localized lymphadenopathy. Right - Description - No Localized lymphadenopathy.    Assessment & Plan Adin Hector MD; 09/08/2016 1:56 PM)  RECTAL ADENOCARCINOMA (C20) Impression: Primary low rectal cancer with evidence of involvement of sphincters for neoadjuvant therapy. Seems to be less so now but with very poor sphincter function and incontinence makes me highly skeptical he would tolerate an ultralow coloanal handsewn anastomosis anastomosis.  I agree with plan for abdominoperineal resection. Reasonable for a robotic/minimally invasive approach.  Cardiac clearance is done. Increased but not prohibitive.  MRI shows concerns of possible cirrhosis and portal hypertension. I Avyon't see any evidence of hepatic dysfunction or any strong evidence of this.  Nonetheless nonsurgical management of his rectal cancer is not a good idea. We'll proceed with surgery. Low threshold to place an stepdown unit/ICU postoperatively.  Do not think the renal lesions ever been evaluated since it was instantly noted on his metastatic workup. We'll have urology see. Appointment made with Dr. Louis Meckel for tomorrow  Current Plans Pt Education - [place] (Evalise Abruzzese): discussed with patient and provided information. Pt Education - [place] (Ladavia Lindenbaum): discussed with patient and provided information. KIDNEY LESION, NATIVE, LEFT (N28.9) Impression: Lesion in left kidney close to the hilum. Suspicious for possible malignancy.  We called [facility]. He had seen Dr. Louis Meckel in the past for issues of urinary retention improved with Flomax. Dr. Louis Meckel also has expertise in renal lesions. He had a sudden opening for tomorrow. We have added him on  The if patient needed surgery, ideally could do at the same time. However I Younes't know if that's feasible in the short-term notice and the need for the large abdominal perineal resection. We have a narrow window to do abdominoperineal resection.  PREOP COLON - ENCOUNTER FOR PREOPERATIVE EXAMINATION FOR GENERAL SURGICAL PROCEDURE (Z01.818)  Current Plans You are being scheduled for surgery- Our schedulers will call you.  You should hear from our office's scheduling department within 5 working days about the location, date, and time of surgery. We try to make accommodations for patient's preferences in scheduling surgery, but sometimes the OR schedule or the surgeon's schedule prevents [place] from making those accommodations.  If you have not heard from our office [phone] in 5 working days, call the office and ask for your surgeon's nurse.  If you have other questions about your diagnosis, plan, or surgery, call the office and ask for your surgeon's nurse.  Written instructions provided Pt Education - Pamphlet Given  - Laparoscopic Colorectal Surgery: discussed with patient and provided information. Pt Education - [facility] Colon Bowel Prep 2015 Miralax/Antibiotics Continued Neomycin Sulfate 500MG , 2 (two) Tablet SEE NOTE, #6, 09/08/2016, No Refill. Local Order: TAKE TWO TABLETS AT 2 PM, 3 PM, AND 10 PM THE DAY PRIOR TO SURGERY Continued Flagyl 500MG , 2 (two) Tablet SEE NOTE, #6, 09/08/2016, No Refill. Local Order: Take at 2pm, 3pm, and 10pm the day prior to your colon operation The anatomy & physiology of the digestive tract was discussed. The pathophysiology of the colon was discussed. Natural history risks without surgery was discussed. I feel the risks of no intervention will lead to serious problems that outweigh the operative risks; therefore, I recommended a partial colectomy to remove the pathology. Minimally invasive (Robotic/Laparoscopic) & open techniques were discussed.  Risks such as bleeding, infection, abscess, leak, reoperation, possible ostomy, hernia, heart attack, death, and other risks were discussed. I noted a good likelihood this will help address the problem. Goals of post-operative recovery were discussed as well. Need for adequate nutrition, daily bowel regimen and healthy physical activity, to optimize recovery was noted as well. We will work to minimize complications. Educational materials were available as well. Questions were answered. The patient expresses understanding & wishes to proceed with surgery.  Pt Education - [facility] Colectomy post-op instructions: discussed with patient and provided information. Pt Education - [place] (Benito Lemmerman): discussed with patient and provided information.  Adin Hector, M.D., F.A.C.S. Gastrointestinal and Minimally Invasive Surgery [facility] [address] [place], [place] [address] [phone] Main / Paging

## 2016-09-08 NOTE — Progress Notes (Signed)
HPI The patient presents for follow-up CAD.  On cardiac cath in Sept of last year he had subtotal occlusion of the small codominant right coronary. He had a borderline lesion in a moderate size first marginal. He had a severe cardiomyopathy with an ejection fraction of 20%. He did have mild to moderate pulmonary venous hypertension and  normal cardiac output.  His echo in Dec demonstrated his EF to be somewhat improved at 30 - 35%. He is being managed for rectal cancer.  He was seen in Feb for preop evaluation.   However, his surgery has been delayed. I do note that he look to be in atrial fibrillation at last visit. He doesn't notice this. He does not feel any palpitations, presyncope or syncope. She's not had any chest pressure, neck or arm discomfort. He's had no weight gain or edema. I reviewed a rhythm strip today 12-lead and he is in atrial fibrillation.    No Known Allergies  Current Outpatient Prescriptions  Medication Sig Dispense Refill  . acetaminophen (TYLENOL) 500 MG tablet Take 1,000 mg by mouth every 6 (six) hours as needed (for pain/fever/headaches.).     Marland Kitchen Ascorbic Acid (VITAMIN C) 1000 MG tablet Take 2,000 mg by mouth 2 (two) times daily.    . carvedilol (COREG) 25 MG tablet Take 1 tablet (25 mg total) by mouth 2 (two) times daily. 180 tablet 3  . ferrous sulfate 325 (65 FE) MG tablet take 1 tablet by mouth once daily WITH BREAKFAST 30 tablet 3  . finasteride (PROSCAR) 5 MG tablet take 1 tablet by mouth once daily 90 tablet 1  . losartan (COZAAR) 25 MG tablet Take 25 mg by mouth at bedtime.    . lovastatin (MEVACOR) 20 MG tablet take 1 tablet by mouth at bedtime 90 tablet 1  . metolazone (ZAROXOLYN) 2.5 MG tablet Take 1 tablet (2.5 mg total) by mouth daily as needed (WEIGHT GAIN 3LBS OR MORE IN 24 HOURS). Weight gain 30 tablet 6  . mirtazapine (REMERON SOL-TAB) 15 MG disintegrating tablet place 1 tablet ON TONGUE at bedtime 90 tablet 0  . Multiple Vitamins-Minerals  (MULTIVITAMIN WITH MINERALS) tablet Take 1 tablet by mouth daily. Men's 50+ Multivitamin    . nitroGLYCERIN (NITROSTAT) 0.4 MG SL tablet Place 1 tablet (0.4 mg total) under the tongue every 5 (five) minutes as needed for chest pain. 25 tablet 3  . omega-3 acid ethyl esters (LOVAZA) 1 g capsule take 2 capsules by mouth twice a day 360 capsule 1  . oxyCODONE-acetaminophen (PERCOCET/ROXICET) 5-325 MG tablet Take 1 tablet by mouth every 8 (eight) hours as needed for severe pain. 30 tablet 0  . potassium chloride SA (K-DUR,KLOR-CON) 20 MEQ tablet Take 3 tablets (60 mEq total) by mouth daily. TAKE AN  2 EXTRA TABLETS WHEN TAKING METOLAZONE 2.5 MG AS NEEDED 286 tablet 3  . tamsulosin (FLOMAX) 0.4 MG CAPS capsule Take 0.4 mg by mouth daily.     Marland Kitchen torsemide (DEMADEX) 20 MG tablet Take 40-80 mg by mouth 2 (two) times daily. 80 mg in the morning  & 40 mg at night    . traZODone (DESYREL) 100 MG tablet Take 100 mg by mouth 2 (two) times daily.  0   No current facility-administered medications for this visit.     Past Medical History:  Diagnosis Date  . Arthritis    "right hand; right knee" (06/19/2014)  . CAD (coronary artery disease)    2v CAD with subtotal occlusion of small  co-dominant RCA and borderline lesion in moderate-sized OM-1  . CHF (congestive heart failure) (HCC)    Preserved EF  . Degenerative joint disease of knee, right   . Enlarged prostate   . Hyperlipidemia   . Hypertension   . Pneumonia 05/2014  . Prediabetes 10/03/2014  . Scrotal edema 04/03/2015  . SMALL BOWEL OBSTRUCTION, HX OF 08/21/2007   Annotation: with narrowing in the ileocecal region Qualifier: Diagnosis of  By: Hassell Done FNP, Tori Milks      Past Surgical History:  Procedure Laterality Date  . CARDIAC CATHETERIZATION N/A 02/16/2016   Procedure: Right/Left Heart Cath and Coronary Angiography;  Surgeon: Jolaine Artist, MD;  Location: [facility];  Service: Cardiovascular;  Laterality: N/A;  . COLONOSCOPY N/A  04/01/2016   Procedure: COLONOSCOPY;  Surgeon: Leighton Ruff, MD;  Location: [facility];  Service: Endoscopy;  Laterality: N/A;  . INGUINAL HERNIA REPAIR Left 1990's  . IR GENERIC HISTORICAL  07/05/2016   IR RADIOLOGIST EVAL & MGMT 07/05/2016 Corrie Mckusick, DO [facility] INTERV RAD    ROS:     As stated in the HPI and negative for all other systems.  PHYSICAL EXAM BP 132/62   Pulse 76   Ht 5\' 9"  (1.753 m)   Wt 205 lb (93 kg)   SpO2 97%   BMI 30.27 kg/m  GENERAL:  Well appearing and in no distress HEENT:  Pupils equal round and reactive, fundi not visualized, oral mucosa unremarkable, poor dentition.  NECK:  Positive jugular venous distention to the jaw at 45 degrees, waveform within normal limits, carotid upstroke brisk and symmetric, no bruits, no thyromegaly LUNGS:  Clear to auscultation bilaterally BACK:  No CVA tenderness CHEST:  Unremarkable HEART:  PMI not displaced or sustained,S1 within normal limits, fixed split S2, no S3,  no clicks, no rubs, no murmurs, irregular ABD:  Flat, positive bowel sounds normal in frequency in pitch, no bruits, no rebound, no guarding, no midline pulsatile mass, no hepatomegaly, no splenomegaly EXT:  2 plus pulses throughout, moderate edema bilateral legs, no cyanosis no clubbing   EKG:  Atrial fibrillation, rate 76, right bundle branch block, left anterior fascicular block, premature ectopic complexes.     ASSESSMENT AND PLAN  ATRIAL FIB:  This is a new finding. However, because of his rectal bleeding cancer he can start anticoagulation. Rate seems to be well controlled and is asymptomati with the use c. Once he said his surgery I can likely start him on anticoagulation. Patrick [name] has a CHA2DS2 - VASc score of 5.  CAD:   We will continue to manage this medically.   He has no new symptoms.   He has had rare NTG use.   Mild renal insufficiency/hypokalemia:    The creat in Feb was 1.39.  No change in therapy is planned.   CM:  Probably  predominantly nonischemic. His blood pressure is somewhat marginal at times not going to titrate his ARB at this point. He also has renal insufficiency seems to be euvolemic with improvement in his ejection fraction.  HTN:  This is being managed in the context of treating his CHF.    PULM HTN:  This is likely secondary.  No change in therapy is indicated.

## 2016-09-09 ENCOUNTER — Ambulatory Visit (INDEPENDENT_AMBULATORY_CARE_PROVIDER_SITE_OTHER): Payer: Medicare Other | Admitting: Cardiology

## 2016-09-09 ENCOUNTER — Encounter: Payer: Self-pay | Admitting: Cardiology

## 2016-09-09 VITALS — BP 132/62 | HR 76 | Ht 69.0 in | Wt 205.0 lb

## 2016-09-09 DIAGNOSIS — I25119 Atherosclerotic heart disease of native coronary artery with unspecified angina pectoris: Secondary | ICD-10-CM

## 2016-09-09 DIAGNOSIS — I4891 Unspecified atrial fibrillation: Secondary | ICD-10-CM | POA: Diagnosis not present

## 2016-09-09 DIAGNOSIS — D3000 Benign neoplasm of unspecified kidney: Secondary | ICD-10-CM | POA: Diagnosis not present

## 2016-09-09 DIAGNOSIS — I428 Other cardiomyopathies: Secondary | ICD-10-CM | POA: Diagnosis not present

## 2016-09-09 NOTE — Telephone Encounter (Signed)
Patient should try taking 3 tablets at night (he does not need to take any in the morning). Thank you!

## 2016-09-09 NOTE — Patient Instructions (Signed)
Your physician recommends that you schedule a follow-up appointment MIDDLE OF June WITH DR [facility]

## 2016-09-09 NOTE — Telephone Encounter (Signed)
Spoke with sister and she is aware of new instructions. Patrik Turnbaugh,CMA

## 2016-09-09 NOTE — Telephone Encounter (Signed)
Will forward to MD to make aware. Jazmin Hartsell,CMA  

## 2016-09-09 NOTE — Telephone Encounter (Signed)
Pt was calling back to let PCP it did not help at all. Pt states he has 2 appointments this afternoon and that it is ok to call the house and talk with his sister Stoudt. ep

## 2016-09-11 ENCOUNTER — Encounter: Payer: Self-pay | Admitting: Cardiology

## 2016-09-11 DIAGNOSIS — I25119 Atherosclerotic heart disease of native coronary artery with unspecified angina pectoris: Secondary | ICD-10-CM | POA: Insufficient documentation

## 2016-09-11 DIAGNOSIS — I428 Other cardiomyopathies: Secondary | ICD-10-CM | POA: Insufficient documentation

## 2016-09-11 DIAGNOSIS — I4891 Unspecified atrial fibrillation: Secondary | ICD-10-CM | POA: Insufficient documentation

## 2016-09-11 HISTORY — DX: Atherosclerotic heart disease of native coronary artery with unspecified angina pectoris: I25.119

## 2016-09-12 ENCOUNTER — Telehealth: Payer: Self-pay | Admitting: Internal Medicine

## 2016-09-12 NOTE — Telephone Encounter (Signed)
Great, thank you!

## 2016-09-12 NOTE — Telephone Encounter (Signed)
Will forward to MD to make aware. Harkirat Orozco,CMA  

## 2016-09-12 NOTE — Telephone Encounter (Signed)
Pt is calling to inform the doctor that since increasing the Trazodone to 3 tablets at bedtime seems to be helping. jw

## 2016-09-13 NOTE — Addendum Note (Signed)
Addended by: Milderd Meager on: 09/13/2016 01:38 PM   Modules accepted: Orders

## 2016-09-19 ENCOUNTER — Telehealth: Payer: Self-pay

## 2016-09-21 NOTE — Patient Instructions (Addendum)
Patrick [name]  09/21/2016   Your procedure is scheduled on: 09-28-16  Report to [facility]  elevators to 3rd floor to [place] at [place] AM.    Call this number if you have problems the morning of surgery [phone]    Remember: ONLY 1 PERSON MAY GO WITH YOU TO SHORT STAY TO GET  READY MORNING OF [place].  Take these medication the morning of surgery with A SIP OF WATER: Carvedilol (Coreg), Finasteride (Proscar), Tamsulosin (Flomax)   Do not eat food or drink liquids :After Midnight.  Drink clear liquids the day of prep to prevent dehydration    CLEAR LIQUID DIET   Foods Allowed                                                                     Foods Excluded  Coffee and tea, regular and decaf                             liquids that you cannot  Plain Jell-O in any flavor                                             see through such as: Fruit ices (not with fruit pulp)                                     milk, soups, orange juice  Iced Popsicles                                    All solid food Carbonated beverages, regular and diet                                    Cranberry, grape and apple juices Sports drinks like Gatorade Lightly seasoned clear broth or consume(fat free) Sugar, honey syrup  Sample Menu Breakfast                                Lunch                                     Supper Cranberry juice                    Beef broth                            Chicken broth Jell-O  Grape juice                           Apple juice Coffee or tea                        Jell-O                                      Popsicle                                                Coffee or tea                        Coffee or tea  _____________________________________________________________________    [facility] - Preparing for Surgery Before surgery, you can play an important role.   Because skin is not sterile, your skin needs to be as free of germs as possible.  You can reduce the number of germs on your skin by washing with CHG (chlorahexidine gluconate) soap before surgery.  CHG is an antiseptic cleaner which kills germs and bonds with the skin to continue killing germs even after washing. Please DO NOT use if you have an allergy to CHG or antibacterial soaps.  If your skin becomes reddened/irritated stop using the CHG and inform your nurse when you arrive at Short Stay. Do not shave (including legs and underarms) for at least 48 hours prior to the first CHG shower.  You may shave your face/neck. Please follow these instructions carefully:  1.  Shower with CHG Soap the night before surgery and the  morning of Surgery.  2.  If you choose to wash your hair, wash your hair first as usual with your  normal  shampoo.  3.  After you shampoo, rinse your hair and body thoroughly to remove the  shampoo.                           4.  Use CHG as you would any other liquid soap.  You can apply chg directly  to the skin and wash                       Gently with a scrungie or clean washcloth.  5.  Apply the CHG Soap to your body ONLY FROM THE NECK DOWN.   Do not use on face/ open                           Wound or open sores. Avoid contact with eyes, ears mouth and genitals (private parts).                       Wash face,  Genitals (private parts) with your normal soap.             6.  Wash thoroughly, paying special attention to the area where your surgery  will be performed.  7.  Thoroughly rinse your body with warm water from the neck down.  8.  DO NOT shower/wash with your normal soap after using and rinsing off  the CHG Soap.                9.  Pat yourself dry with a clean towel.            10.  Wear clean pajamas.            11.  Place clean sheets on your bed the night of your first shower and do not  sleep with pets. Day of Surgery : Do not apply any lotions/deodorants the  morning of surgery.  Please wear clean clothes to the hospital/surgery center.  FAILURE TO FOLLOW THESE INSTRUCTIONS MAY RESULT IN THE CANCELLATION OF YOUR SURGERY PATIENT SIGNATURE_________________________________  NURSE SIGNATURE__________________________________  ________________________________________________________________________  WHAT IS A BLOOD TRANSFUSION? Blood Transfusion Information  A transfusion is the replacement of blood or some of its parts. Blood is made up of multiple cells which provide different functions.  Red blood cells carry oxygen and are used for blood loss replacement.  White blood cells fight against infection.  Platelets control bleeding.  Plasma helps clot blood.  Other blood products are available for specialized needs, such as hemophilia or other clotting disorders. BEFORE THE TRANSFUSION  Who gives blood for transfusions?   Healthy volunteers who are fully evaluated to make sure their blood is safe. This is blood bank blood. Transfusion therapy is the safest it has ever been in the practice of medicine. Before blood is taken from a donor, a complete history is taken to make sure that person has no history of diseases nor engages in risky social behavior (examples are intravenous drug use or sexual activity with multiple partners). The donor's travel history is screened to minimize risk of transmitting infections, such as malaria. The donated blood is tested for signs of infectious diseases, such as HIV and hepatitis. The blood is then tested to be sure it is compatible with you in order to minimize the chance of a transfusion reaction. If you or a relative donates blood, this is often done in anticipation of surgery and is not appropriate for emergency situations. It takes many days to process the donated blood. RISKS AND COMPLICATIONS Although transfusion therapy is very safe and saves many lives, the main dangers of transfusion include:   Getting an  infectious disease.  Developing a transfusion reaction. This is an allergic reaction to something in the blood you were given. Every precaution is taken to prevent this. The decision to have a blood transfusion has been considered carefully by your caregiver before blood is given. Blood is not given unless the benefits outweigh the risks. AFTER THE TRANSFUSION  Right after receiving a blood transfusion, you will usually feel much better and more energetic. This is especially true if your red blood cells have gotten low (anemic). The transfusion raises the level of the red blood cells which carry oxygen, and this usually causes an energy increase.  The nurse administering the transfusion will monitor you carefully for complications. HOME CARE INSTRUCTIONS  No special instructions are needed after a transfusion. You may find your energy is better. Speak with your caregiver about any limitations on activity for underlying diseases you may have. SEEK MEDICAL CARE IF:   Your condition is not improving after your transfusion.  You develop redness or irritation at the intravenous (IV) site. SEEK IMMEDIATE MEDICAL CARE IF:  Any of the following symptoms occur over the next 12 hours:  Shaking chills.  You have a temperature by mouth above 102 F (38.9 C), not controlled by  medicine.  Chest, back, or muscle pain.  People around you feel you are not acting correctly or are confused.  Shortness of breath or difficulty breathing.  Dizziness and fainting.  You get a rash or develop hives.  You have a decrease in urine output.  Your urine turns a dark color or changes to pink, red, or brown. Any of the following symptoms occur over the next 10 days:  You have a temperature by mouth above 102 F (38.9 C), not controlled by medicine.  Shortness of breath.  Weakness after normal activity.  The white part of the eye turns yellow (jaundice).  You have a decrease in the amount of urine or  are urinating less often.  Your urine turns a dark color or changes to pink, red, or brown. Document Released: 05/13/2000 Document Revised: 08/08/2011 Document Reviewed: 12/31/2007 [facility] Patient Information 2014 [place], [place].  _______________________________________________________________________

## 2016-09-21 NOTE — Progress Notes (Signed)
09-09-16 (EPIC) EKG 05-20-16 (EPIC) ECHO 05-29-16  (EPIC) CXR

## 2016-09-22 ENCOUNTER — Ambulatory Visit: Payer: Medicare Other | Admitting: Family Medicine

## 2016-09-23 ENCOUNTER — Encounter (HOSPITAL_COMMUNITY)
Admission: RE | Admit: 2016-09-23 | Discharge: 2016-09-23 | Disposition: A | Discharge status: Home / Self Care | Payer: Medicare Other | Source: Ambulatory Visit | Attending: Surgery | Admitting: Surgery

## 2016-09-23 ENCOUNTER — Encounter (HOSPITAL_COMMUNITY): Payer: Self-pay

## 2016-09-23 DIAGNOSIS — C20 Malignant neoplasm of rectum: Secondary | ICD-10-CM | POA: Diagnosis not present

## 2016-09-23 DIAGNOSIS — Z0183 Encounter for blood typing: Secondary | ICD-10-CM | POA: Diagnosis not present

## 2016-09-23 DIAGNOSIS — Z01812 Encounter for preprocedural laboratory examination: Secondary | ICD-10-CM | POA: Insufficient documentation

## 2016-09-23 DIAGNOSIS — Z01818 Encounter for other preprocedural examination: Secondary | ICD-10-CM | POA: Insufficient documentation

## 2016-09-23 HISTORY — DX: Cardiac arrhythmia, unspecified: I49.9

## 2016-09-23 LAB — CBC
HCT: 32.3 % — ABNORMAL LOW (ref 39.0–52.0)
Hemoglobin: 10.6 g/dL — ABNORMAL LOW (ref 13.0–17.0)
MCH: 28.2 pg (ref 26.0–34.0)
MCHC: 32.8 g/dL (ref 30.0–36.0)
MCV: 85.9 fL (ref 78.0–100.0)
PLATELETS: 109 10*3/uL — AB (ref 150–400)
RBC: 3.76 MIL/uL — AB (ref 4.22–5.81)
RDW: 17 % — ABNORMAL HIGH (ref 11.5–15.5)
WBC: 6 10*3/uL (ref 4.0–10.5)

## 2016-09-23 LAB — BASIC METABOLIC PANEL
Anion gap: 13 (ref 5–15)
BUN: 43 mg/dL — AB (ref 6–20)
CALCIUM: 9.2 mg/dL (ref 8.9–10.3)
CO2: 32 mmol/L (ref 22–32)
CREATININE: 1.55 mg/dL — AB (ref 0.61–1.24)
Chloride: 98 mmol/L — ABNORMAL LOW (ref 101–111)
GFR calc non Af Amer: 44 mL/min — ABNORMAL LOW (ref >60)
GFR, EST AFRICAN AMERICAN: 51 mL/min — AB (ref >60)
Glucose, Bld: 130 mg/dL — ABNORMAL HIGH (ref 65–99)
Potassium: 2.6 mmol/L — CL (ref 3.5–5.1)
SODIUM: 143 mmol/L (ref 135–145)

## 2016-09-23 NOTE — Consult Note (Addendum)
[place] Nurse requested for preoperative stoma site marking  Discussed surgical procedure and stoma creation with patient.  Explained role of the [place] nurse team.  Provided the patient with educational booklet/DVD and provided samples of pouching options.  Answered patient's questions and demonstrated pouch appearance.   Examined patient sitting and standing in order to place the marking in the patient's visual field, away from any creases or abdominal contour issues and within the rectus muscle.  Attempted to mark below the patient's belt line, but this was not possible since he has significant creases below that level which occur when he sits forward and area should be avoided if possible.   Marked for colostomy in the LUQ  __5__ cm to the left of the umbilicus and _1__OX above the umbilicus. Marked for ileostomy  in the RUQ  __5__ cm to the right of the umbilicus and _0__RU above the umbilicus.   Patient's abdomen cleansed with CHG wipes at site markings, allowed to air dry prior to marking. Gave patient a marking pen and instructed him to re-draw the circle if it fades before surgery.  [place] team will follow post-op for educational sessions. Julien Girt MSN, RN, [place], [place], [place]

## 2016-09-23 NOTE — Progress Notes (Signed)
09-23-16 CBC results faxed to Dr. Johney [place] for review.

## 2016-09-24 LAB — ABO/RH: ABO/RH(D): O NEG

## 2016-09-24 LAB — HEMOGLOBIN A1C
HEMOGLOBIN A1C: 5.7 % — AB (ref 4.8–5.6)
MEAN PLASMA GLUCOSE: 117 mg/dL

## 2016-09-26 ENCOUNTER — Telehealth: Payer: Self-pay | Admitting: Internal Medicine

## 2016-09-26 ENCOUNTER — Other Ambulatory Visit: Payer: Self-pay | Admitting: Family Medicine

## 2016-09-26 ENCOUNTER — Other Ambulatory Visit: Payer: Self-pay | Admitting: Internal Medicine

## 2016-09-26 ENCOUNTER — Other Ambulatory Visit: Payer: Medicare Other

## 2016-09-26 DIAGNOSIS — E876 Hypokalemia: Secondary | ICD-10-CM | POA: Diagnosis not present

## 2016-09-26 NOTE — Telephone Encounter (Signed)
Pt was contacted and is on his way now. I informed pt he needed to be here no later than 445p, pt voiced understanding.

## 2016-09-26 NOTE — Progress Notes (Signed)
Called over to [place] [facility] and spoke with [facility] in the nursing office. Informed her that patient had a critical potassium that resulted after office hours on 09-23-16 and RN wanted to inquire if the critical had been addressed. Shameera confirmed that Dr Johney [place] was aware of the critical potassium and that patient would "definitely need a BMP and Magnesium collected on the day of surgery". She also stated that she was just about to contact patient because Dr Johney [place] wanted patient to follow up with their primary doctor before surgery if possible.   Indicated on patient front sheet to collect a BMP and Magnesium day of surgery

## 2016-09-26 NOTE — Telephone Encounter (Signed)
Pt is scheduled for surgery 09-28-16. His potassium level was very low.  The dr at [place] [facility] increased his pills to 5 per day.  Pt wants to know if can come in and have this rechecked.  If the level is still low , the surgery will be postponed.

## 2016-09-26 NOTE — Telephone Encounter (Signed)
Please let Mr. Blackwelder know that I have placed an order to have his potassium rechecked. He can come in at any time to have this done. I have also ordered a magnesium level, because if this is low, it can cause potassium to be low too. Will call him with the results.

## 2016-09-27 ENCOUNTER — Other Ambulatory Visit: Payer: Medicare Other

## 2016-09-27 ENCOUNTER — Ambulatory Visit: Payer: Medicare Other | Admitting: Cardiology

## 2016-09-27 LAB — BASIC METABOLIC PANEL
BUN/Creatinine Ratio: 13 (ref 10–24)
BUN/Creatinine Ratio: 16 (ref 10–24)
BUN: 23 mg/dL (ref 8–27)
BUN: 23 mg/dL (ref 8–27)
CHLORIDE: 100 mmol/L (ref 96–106)
CO2: 29 mmol/L (ref 18–29)
CO2: 30 mmol/L — ABNORMAL HIGH (ref 18–29)
Calcium: 9.1 mg/dL (ref 8.6–10.2)
Calcium: 8.9 mg/dL (ref 8.6–10.2)
Chloride: 100 mmol/L (ref 96–106)
Creatinine, Ser: 1.75 mg/dL — ABNORMAL HIGH (ref 0.76–1.27)
Creatinine, Ser: 1.47 mg/dL — ABNORMAL HIGH (ref 0.76–1.27)
GFR calc Af Amer: 56 mL/min/{1.73_m2} — ABNORMAL LOW (ref >59)
GFR, EST AFRICAN AMERICAN: 45 mL/min/{1.73_m2} — AB (ref >59)
GFR, EST NON AFRICAN AMERICAN: 39 mL/min/{1.73_m2} — AB (ref >59)
GFR, EST NON AFRICAN AMERICAN: 48 mL/min/{1.73_m2} — AB (ref >59)
Glucose: 150 mg/dL — ABNORMAL HIGH (ref 65–99)
Glucose: 152 mg/dL — ABNORMAL HIGH (ref 65–99)
POTASSIUM: 3.8 mmol/L (ref 3.5–5.2)
Potassium: 3.8 mmol/L (ref 3.5–5.2)
SODIUM: 146 mmol/L — AB (ref 134–144)
Sodium: 148 mmol/L — ABNORMAL HIGH (ref 134–144)

## 2016-09-27 LAB — SPECIMEN STATUS

## 2016-09-27 LAB — MAGNESIUM
MAGNESIUM: 2 mg/dL (ref 1.6–2.3)
Magnesium: 2 mg/dL (ref 1.6–2.3)

## 2016-09-28 ENCOUNTER — Inpatient Hospital Stay (HOSPITAL_COMMUNITY): Payer: Medicare Other | Admitting: Anesthesiology

## 2016-09-28 ENCOUNTER — Inpatient Hospital Stay (HOSPITAL_COMMUNITY)
Admission: RE | Admit: 2016-09-28 | Discharge: 2016-10-02 | DRG: 330 | Disposition: A | Discharge status: Skilled Nursing Facility | Payer: Medicare Other | Source: Ambulatory Visit | Attending: Surgery | Admitting: Surgery

## 2016-09-28 ENCOUNTER — Encounter (HOSPITAL_COMMUNITY)
Admission: RE | Disposition: A | Discharge status: Skilled Nursing Facility | Payer: Self-pay | Source: Ambulatory Visit | Attending: Surgery

## 2016-09-28 ENCOUNTER — Encounter (HOSPITAL_COMMUNITY): Payer: Self-pay | Admitting: *Deleted

## 2016-09-28 DIAGNOSIS — I129 Hypertensive chronic kidney disease with stage 1 through stage 4 chronic kidney disease, or unspecified chronic kidney disease: Secondary | ICD-10-CM | POA: Diagnosis present

## 2016-09-28 DIAGNOSIS — I2510 Atherosclerotic heart disease of native coronary artery without angina pectoris: Secondary | ICD-10-CM | POA: Diagnosis not present

## 2016-09-28 DIAGNOSIS — C772 Secondary and unspecified malignant neoplasm of intra-abdominal lymph nodes: Secondary | ICD-10-CM | POA: Diagnosis not present

## 2016-09-28 DIAGNOSIS — N400 Enlarged prostate without lower urinary tract symptoms: Secondary | ICD-10-CM | POA: Diagnosis present

## 2016-09-28 DIAGNOSIS — E669 Obesity, unspecified: Secondary | ICD-10-CM | POA: Diagnosis present

## 2016-09-28 DIAGNOSIS — K6289 Other specified diseases of anus and rectum: Secondary | ICD-10-CM | POA: Diagnosis present

## 2016-09-28 DIAGNOSIS — E785 Hyperlipidemia, unspecified: Secondary | ICD-10-CM | POA: Diagnosis not present

## 2016-09-28 DIAGNOSIS — Z6829 Body mass index (BMI) 29.0-29.9, adult: Secondary | ICD-10-CM

## 2016-09-28 DIAGNOSIS — I130 Hypertensive heart and chronic kidney disease with heart failure and stage 1 through stage 4 chronic kidney disease, or unspecified chronic kidney disease: Secondary | ICD-10-CM | POA: Diagnosis not present

## 2016-09-28 DIAGNOSIS — N183 Chronic kidney disease, stage 3 unspecified: Secondary | ICD-10-CM | POA: Diagnosis present

## 2016-09-28 DIAGNOSIS — Z9049 Acquired absence of other specified parts of digestive tract: Secondary | ICD-10-CM | POA: Diagnosis not present

## 2016-09-28 DIAGNOSIS — Z8042 Family history of malignant neoplasm of prostate: Secondary | ICD-10-CM | POA: Diagnosis not present

## 2016-09-28 DIAGNOSIS — C188 Malignant neoplasm of overlapping sites of colon: Secondary | ICD-10-CM | POA: Diagnosis not present

## 2016-09-28 DIAGNOSIS — I255 Ischemic cardiomyopathy: Secondary | ICD-10-CM | POA: Diagnosis not present

## 2016-09-28 DIAGNOSIS — I428 Other cardiomyopathies: Secondary | ICD-10-CM | POA: Diagnosis not present

## 2016-09-28 DIAGNOSIS — K625 Hemorrhage of anus and rectum: Secondary | ICD-10-CM | POA: Diagnosis not present

## 2016-09-28 DIAGNOSIS — Z7982 Long term (current) use of aspirin: Secondary | ICD-10-CM

## 2016-09-28 DIAGNOSIS — N184 Chronic kidney disease, stage 4 (severe): Secondary | ICD-10-CM | POA: Diagnosis present

## 2016-09-28 DIAGNOSIS — C20 Malignant neoplasm of rectum: Principal | ICD-10-CM | POA: Diagnosis present

## 2016-09-28 DIAGNOSIS — I10 Essential (primary) hypertension: Secondary | ICD-10-CM | POA: Diagnosis not present

## 2016-09-28 DIAGNOSIS — Y842 Radiological procedure and radiotherapy as the cause of abnormal reaction of the patient, or of later complication, without mention of misadventure at the time of the procedure: Secondary | ICD-10-CM | POA: Diagnosis present

## 2016-09-28 DIAGNOSIS — R32 Unspecified urinary incontinence: Secondary | ICD-10-CM | POA: Diagnosis not present

## 2016-09-28 DIAGNOSIS — Z9221 Personal history of antineoplastic chemotherapy: Secondary | ICD-10-CM | POA: Diagnosis not present

## 2016-09-28 DIAGNOSIS — C801 Malignant (primary) neoplasm, unspecified: Secondary | ICD-10-CM | POA: Diagnosis not present

## 2016-09-28 DIAGNOSIS — D500 Iron deficiency anemia secondary to blood loss (chronic): Secondary | ICD-10-CM | POA: Diagnosis not present

## 2016-09-28 DIAGNOSIS — I5022 Chronic systolic (congestive) heart failure: Secondary | ICD-10-CM | POA: Diagnosis not present

## 2016-09-28 HISTORY — PX: XI ROBOT ABDOMINAL PERINEAL RESECTION: SHX6709

## 2016-09-28 LAB — POCT I-STAT 7, (LYTES, BLD GAS, ICA,H+H)
Acid-Base Excess: 2 mmol/L (ref 0.0–2.0)
Bicarbonate: 28.5 mmol/L — ABNORMAL HIGH (ref 20.0–28.0)
Calcium, Ion: 1.19 mmol/L (ref 1.15–1.40)
HCT: 26 % — ABNORMAL LOW (ref 39.0–52.0)
HEMOGLOBIN: 8.8 g/dL — AB (ref 13.0–17.0)
O2 Saturation: 99 %
PCO2 ART: 52.3 mmHg — AB (ref 32.0–48.0)
PO2 ART: 130 mmHg — AB (ref 83.0–108.0)
Potassium: 4 mmol/L (ref 3.5–5.1)
Sodium: 143 mmol/L (ref 135–145)
TCO2: 30 mmol/L (ref 0–100)
pH, Arterial: 7.345 — ABNORMAL LOW (ref 7.350–7.450)

## 2016-09-28 LAB — GLUCOSE, CAPILLARY: Glucose-Capillary: 127 mg/dL — ABNORMAL HIGH (ref 65–99)

## 2016-09-28 LAB — MRSA PCR SCREENING: MRSA BY PCR: POSITIVE — AB

## 2016-09-28 SURGERY — RESECTION, ABDOMINOPERINEAL, ROBOT-ASSISTED
Anesthesia: General | Site: Abdomen

## 2016-09-28 MED ORDER — ALUM & MAG HYDROXIDE-SIMETH 200-200-20 MG/5ML PO SUSP: 30.0000 mL | Freq: Four times a day (QID) | ORAL | Status: DC | PRN

## 2016-09-28 MED ORDER — METOPROLOL TARTRATE 12.5 MG HALF TABLET: 12.5000 mg | ORAL_TABLET | Freq: Two times a day (BID) | ORAL | Status: DC | PRN

## 2016-09-28 MED ORDER — ROCURONIUM BROMIDE 50 MG/5ML IV SOSY
PREFILLED_SYRINGE | INTRAVENOUS | Status: AC
Start: 2016-09-28 — End: 2016-09-28
  Filled 2016-09-28: qty 5

## 2016-09-28 MED ORDER — HYDROMORPHONE HCL 1 MG/ML IJ SOLN: 0.5000 mg | INTRAMUSCULAR | Status: DC | PRN

## 2016-09-28 MED ORDER — DIPHENHYDRAMINE HCL 12.5 MG/5ML PO ELIX: 12.5000 mg | ORAL_SOLUTION | Freq: Four times a day (QID) | ORAL | Status: DC | PRN

## 2016-09-28 MED ORDER — SODIUM CHLORIDE 0.9% FLUSH: 3.0000 mL | INTRAVENOUS | Status: DC | PRN

## 2016-09-28 MED ORDER — FENTANYL CITRATE (PF) 250 MCG/5ML IJ SOLN
INTRAMUSCULAR | Status: AC
  Filled 2016-09-28: qty 5

## 2016-09-28 MED ORDER — PROCHLORPERAZINE EDISYLATE 5 MG/ML IJ SOLN: 5.0000 mg | INTRAMUSCULAR | Status: DC | PRN

## 2016-09-28 MED ORDER — NEOMYCIN SULFATE 500 MG PO TABS: 1000.0000 mg | ORAL_TABLET | ORAL | Status: DC

## 2016-09-28 MED ORDER — ONDANSETRON HCL 4 MG PO TABS: 4.0000 mg | ORAL_TABLET | Freq: Four times a day (QID) | ORAL | Status: DC | PRN

## 2016-09-28 MED ORDER — ENOXAPARIN SODIUM 40 MG/0.4ML ~~LOC~~ SOLN: 40.0000 mg | SUBCUTANEOUS | Status: DC

## 2016-09-28 MED ORDER — DIPHENHYDRAMINE HCL 50 MG/ML IJ SOLN: 12.5000 mg | Freq: Four times a day (QID) | INTRAMUSCULAR | Status: DC | PRN

## 2016-09-28 MED ORDER — ALBUMIN HUMAN 5 % IV SOLN
INTRAVENOUS | Status: AC
  Filled 2016-09-28: qty 250

## 2016-09-28 MED ORDER — GABAPENTIN 300 MG PO CAPS
300.0000 mg | ORAL_CAPSULE | ORAL | Status: AC
  Administered 2016-09-28: 300 mg via ORAL
  Filled 2016-09-28: qty 1

## 2016-09-28 MED ORDER — ROCURONIUM BROMIDE 50 MG/5ML IV SOSY
PREFILLED_SYRINGE | INTRAVENOUS | Status: AC
  Filled 2016-09-28: qty 5

## 2016-09-28 MED ORDER — MIDAZOLAM HCL 2 MG/2ML IJ SOLN
INTRAMUSCULAR | Status: AC
  Filled 2016-09-28: qty 2

## 2016-09-28 MED ORDER — ALVIMOPAN 12 MG PO CAPS
12.0000 mg | ORAL_CAPSULE | Freq: Two times a day (BID) | ORAL | Status: DC
  Administered 2016-09-29 (×2): 12 mg via ORAL
  Filled 2016-09-28 (×3): qty 1

## 2016-09-28 MED ORDER — METOPROLOL TARTRATE 5 MG/5ML IV SOLN: 5.0000 mg | Freq: Four times a day (QID) | INTRAVENOUS | Status: DC | PRN

## 2016-09-28 MED ORDER — ONDANSETRON HCL 4 MG/2ML IJ SOLN
INTRAMUSCULAR | Status: DC | PRN
  Administered 2016-09-28: 4 mg via INTRAVENOUS

## 2016-09-28 MED ORDER — MAGIC MOUTHWASH: 15.0000 mL | Freq: Four times a day (QID) | ORAL | Status: DC | PRN

## 2016-09-28 MED ORDER — FENTANYL CITRATE (PF) 100 MCG/2ML IJ SOLN
INTRAMUSCULAR | Status: DC | PRN
  Administered 2016-09-28: 50 ug via INTRAVENOUS
  Administered 2016-09-28: 100 ug via INTRAVENOUS
  Administered 2016-09-28 (×7): 50 ug via INTRAVENOUS

## 2016-09-28 MED ORDER — DEXTROSE 5 % IV SOLN
2.0000 g | INTRAVENOUS | Status: AC
  Administered 2016-09-28: 2 g via INTRAVENOUS

## 2016-09-28 MED ORDER — ETOMIDATE 2 MG/ML IV SOLN
INTRAVENOUS | Status: DC | PRN
  Administered 2016-09-28: 16 mg via INTRAVENOUS

## 2016-09-28 MED ORDER — PROMETHAZINE HCL 25 MG/ML IJ SOLN: 6.2500 mg | INTRAMUSCULAR | Status: DC | PRN

## 2016-09-28 MED ORDER — ENALAPRILAT 1.25 MG/ML IV SOLN
0.6250 mg | Freq: Four times a day (QID) | INTRAVENOUS | Status: DC | PRN
  Filled 2016-09-28: qty 1

## 2016-09-28 MED ORDER — ACETAMINOPHEN 500 MG PO TABS
1000.0000 mg | ORAL_TABLET | Freq: Three times a day (TID) | ORAL | Status: DC
  Administered 2016-09-28 – 2016-10-02 (×11): 1000 mg via ORAL
  Filled 2016-09-28 (×11): qty 2

## 2016-09-28 MED ORDER — TRAZODONE HCL 100 MG PO TABS
300.0000 mg | ORAL_TABLET | Freq: Every day | ORAL | Status: DC
  Administered 2016-09-28 – 2016-10-02 (×4): 300 mg via ORAL
  Filled 2016-09-28 (×3): qty 3
  Filled 2016-09-28: qty 6
  Filled 2016-09-28: qty 3

## 2016-09-28 MED ORDER — LIDOCAINE 2% (20 MG/ML) 5 ML SYRINGE
INTRAMUSCULAR | Status: AC
  Filled 2016-09-28: qty 5

## 2016-09-28 MED ORDER — SUGAMMADEX SODIUM 500 MG/5ML IV SOLN
INTRAVENOUS | Status: AC
  Filled 2016-09-28: qty 5

## 2016-09-28 MED ORDER — DEXTROSE 5 % IV SOLN: 2.0000 g | Freq: Two times a day (BID) | INTRAVENOUS | Status: DC

## 2016-09-28 MED ORDER — ONDANSETRON HCL 4 MG/2ML IJ SOLN: 4.0000 mg | Freq: Four times a day (QID) | INTRAMUSCULAR | Status: DC | PRN

## 2016-09-28 MED ORDER — SODIUM CHLORIDE 0.9 % IJ SOLN
INTRAMUSCULAR | Status: DC | PRN
  Administered 2016-09-28: 50 mL

## 2016-09-28 MED ORDER — BISACODYL 5 MG PO TBEC: 20.0000 mg | DELAYED_RELEASE_TABLET | Freq: Once | ORAL | Status: DC

## 2016-09-28 MED ORDER — METHOCARBAMOL 750 MG PO TABS: 750.0000 mg | ORAL_TABLET | Freq: Four times a day (QID) | ORAL | 2 refills | Status: DC | PRN

## 2016-09-28 MED ORDER — NITROGLYCERIN 0.4 MG SL SUBL: 0.4000 mg | SUBLINGUAL_TABLET | SUBLINGUAL | Status: DC | PRN

## 2016-09-28 MED ORDER — OXYCODONE HCL 5 MG PO TABS
5.0000 mg | ORAL_TABLET | ORAL | Status: DC | PRN
  Administered 2016-09-29: 10 mg via ORAL
  Administered 2016-09-29: 5 mg via ORAL
  Administered 2016-10-01 – 2016-10-02 (×3): 10 mg via ORAL
  Filled 2016-09-28 (×4): qty 2
  Filled 2016-09-28: qty 1

## 2016-09-28 MED ORDER — SACCHAROMYCES BOULARDII 250 MG PO CAPS
250.0000 mg | ORAL_CAPSULE | Freq: Two times a day (BID) | ORAL | Status: DC
  Administered 2016-09-28 – 2016-10-02 (×8): 250 mg via ORAL
  Filled 2016-09-28 (×8): qty 1

## 2016-09-28 MED ORDER — DEXAMETHASONE SODIUM PHOSPHATE 4 MG/ML IJ SOLN
4.0000 mg | Freq: Two times a day (BID) | INTRAMUSCULAR | Status: AC
  Administered 2016-09-28 – 2016-10-01 (×6): 4 mg via INTRAVENOUS
  Filled 2016-09-28 (×6): qty 1

## 2016-09-28 MED ORDER — SUCCINYLCHOLINE CHLORIDE 200 MG/10ML IV SOSY
PREFILLED_SYRINGE | INTRAVENOUS | Status: DC | PRN
  Administered 2016-09-28: 120 mg via INTRAVENOUS

## 2016-09-28 MED ORDER — BUPIVACAINE HCL (PF) 0.25 % IJ SOLN
INTRAMUSCULAR | Status: AC
  Filled 2016-09-28: qty 30

## 2016-09-28 MED ORDER — SACCHAROMYCES BOULARDII 250 MG PO CAPS: 250.0000 mg | ORAL_CAPSULE | Freq: Two times a day (BID) | ORAL | Status: DC

## 2016-09-28 MED ORDER — MIRTAZAPINE 15 MG PO TBDP
15.0000 mg | ORAL_TABLET | Freq: Every day | ORAL | Status: DC
  Administered 2016-09-28 – 2016-10-01 (×4): 15 mg via ORAL
  Filled 2016-09-28 (×4): qty 1

## 2016-09-28 MED ORDER — SODIUM CHLORIDE 0.9% FLUSH
3.0000 mL | Freq: Two times a day (BID) | INTRAVENOUS | Status: DC
  Administered 2016-09-28: 3 mL via INTRAVENOUS

## 2016-09-28 MED ORDER — HYDROMORPHONE HCL 1 MG/ML IJ SOLN
INTRAMUSCULAR | Status: AC
  Administered 2016-09-28: 0.5 mg via INTRAVENOUS
  Filled 2016-09-28: qty 1

## 2016-09-28 MED ORDER — GENTAMICIN SULFATE 40 MG/ML IJ SOLN
INTRAMUSCULAR | Status: DC
  Filled 2016-09-28: qty 6

## 2016-09-28 MED ORDER — 0.9 % SODIUM CHLORIDE (POUR BTL) OPTIME
TOPICAL | Status: DC | PRN
  Administered 2016-09-28: 2000 mL

## 2016-09-28 MED ORDER — ENOXAPARIN SODIUM 40 MG/0.4ML ~~LOC~~ SOLN
40.0000 mg | Freq: Once | SUBCUTANEOUS | Status: AC
  Administered 2016-09-28: 40 mg via SUBCUTANEOUS
  Filled 2016-09-28: qty 0.4

## 2016-09-28 MED ORDER — SODIUM CHLORIDE 0.9% FLUSH: 3.0000 mL | Freq: Two times a day (BID) | INTRAVENOUS | Status: DC

## 2016-09-28 MED ORDER — ENALAPRILAT 1.25 MG/ML IV SOLN: 0.6250 mg | Freq: Four times a day (QID) | INTRAVENOUS | Status: DC | PRN

## 2016-09-28 MED ORDER — TAMSULOSIN HCL 0.4 MG PO CAPS
0.4000 mg | ORAL_CAPSULE | Freq: Every day | ORAL | Status: DC
  Administered 2016-09-29 – 2016-10-02 (×4): 0.4 mg via ORAL
  Filled 2016-09-28 (×4): qty 1

## 2016-09-28 MED ORDER — BUPIVACAINE LIPOSOME 1.3 % IJ SUSP
20.0000 mL | INTRAMUSCULAR | Status: DC
  Filled 2016-09-28: qty 20

## 2016-09-28 MED ORDER — CEFOTETAN DISODIUM-DEXTROSE 2-2.08 GM-% IV SOLR
INTRAVENOUS | Status: AC
  Administered 2016-09-28: 2 g via INTRAVENOUS
  Filled 2016-09-28: qty 50

## 2016-09-28 MED ORDER — SODIUM CHLORIDE 0.9 % IJ SOLN
INTRAMUSCULAR | Status: AC
  Filled 2016-09-28: qty 50

## 2016-09-28 MED ORDER — SODIUM CHLORIDE 0.9 % IV SOLN: Freq: Once | INTRAVENOUS | Status: DC

## 2016-09-28 MED ORDER — METRONIDAZOLE 500 MG PO TABS: 1000.0000 mg | ORAL_TABLET | ORAL | Status: DC

## 2016-09-28 MED ORDER — PHENOL 1.4 % MT LIQD: 2.0000 | OROMUCOSAL | Status: DC | PRN

## 2016-09-28 MED ORDER — LACTATED RINGERS IV SOLN: 1000.0000 mL | Freq: Three times a day (TID) | INTRAVENOUS | Status: AC | PRN

## 2016-09-28 MED ORDER — LIDOCAINE 2% (20 MG/ML) 5 ML SYRINGE
INTRAMUSCULAR | Status: AC
  Filled 2016-09-28: qty 10

## 2016-09-28 MED ORDER — MAGIC MOUTHWASH
15.0000 mL | Freq: Four times a day (QID) | ORAL | Status: DC | PRN
  Filled 2016-09-28: qty 15

## 2016-09-28 MED ORDER — LIP MEDEX EX OINT
1.0000 "application " | TOPICAL_OINTMENT | Freq: Two times a day (BID) | CUTANEOUS | Status: DC
  Administered 2016-09-28 – 2016-10-01 (×6): 1 via TOPICAL
  Filled 2016-09-28 (×3): qty 7

## 2016-09-28 MED ORDER — BUPIVACAINE HCL (PF) 0.25 % IJ SOLN
INTRAMUSCULAR | Status: DC | PRN
  Administered 2016-09-28: 30 mL

## 2016-09-28 MED ORDER — POLYETHYLENE GLYCOL 3350 17 GM/SCOOP PO POWD: 1.0000 | Freq: Once | ORAL | Status: DC

## 2016-09-28 MED ORDER — VITAMIN C 500 MG PO TABS
2000.0000 mg | ORAL_TABLET | Freq: Two times a day (BID) | ORAL | Status: DC
  Administered 2016-09-28 – 2016-10-02 (×8): 2000 mg via ORAL
  Filled 2016-09-28 (×9): qty 4

## 2016-09-28 MED ORDER — METHOCARBAMOL 1000 MG/10ML IJ SOLN
1000.0000 mg | Freq: Four times a day (QID) | INTRAVENOUS | Status: DC | PRN
  Filled 2016-09-28: qty 10

## 2016-09-28 MED ORDER — SODIUM CHLORIDE 0.9 % IV SOLN
INTRAVENOUS | Status: DC | PRN
  Administered 2016-09-28: 1000 mL

## 2016-09-28 MED ORDER — EPHEDRINE 5 MG/ML INJ
INTRAVENOUS | Status: AC
  Filled 2016-09-28: qty 10

## 2016-09-28 MED ORDER — CHLORHEXIDINE GLUCONATE 4 % EX LIQD: 60.0000 mL | Freq: Once | CUTANEOUS | Status: DC

## 2016-09-28 MED ORDER — DEXTROSE 5 % IV SOLN
2.0000 g | Freq: Two times a day (BID) | INTRAVENOUS | Status: AC
  Administered 2016-09-29: 2 g via INTRAVENOUS
  Filled 2016-09-28: qty 2

## 2016-09-28 MED ORDER — DEXAMETHASONE SODIUM PHOSPHATE 10 MG/ML IJ SOLN
INTRAMUSCULAR | Status: AC
  Filled 2016-09-28: qty 1

## 2016-09-28 MED ORDER — SODIUM CHLORIDE 0.9 % IV SOLN: INTRAVENOUS | Status: DC

## 2016-09-28 MED ORDER — MENTHOL 3 MG MT LOZG: 1.0000 | LOZENGE | OROMUCOSAL | Status: DC | PRN

## 2016-09-28 MED ORDER — ALBUMIN HUMAN 5 % IV SOLN
INTRAVENOUS | Status: DC | PRN
  Administered 2016-09-28 (×2): via INTRAVENOUS

## 2016-09-28 MED ORDER — METHYLENE BLUE 0.5 % INJ SOLN
INTRAVENOUS | Status: AC
  Filled 2016-09-28: qty 10

## 2016-09-28 MED ORDER — ALVIMOPAN 12 MG PO CAPS
12.0000 mg | ORAL_CAPSULE | Freq: Once | ORAL | Status: AC
  Administered 2016-09-28: 12 mg via ORAL
  Filled 2016-09-28: qty 1

## 2016-09-28 MED ORDER — SODIUM CHLORIDE 0.9 % IV SOLN
INTRAVENOUS | Status: DC
  Administered 2016-09-28: 20:00:00 via INTRAVENOUS

## 2016-09-28 MED ORDER — BUPIVACAINE LIPOSOME 1.3 % IJ SUSP
INTRAMUSCULAR | Status: DC | PRN
  Administered 2016-09-28: 20 mL

## 2016-09-28 MED ORDER — SUCCINYLCHOLINE CHLORIDE 200 MG/10ML IV SOSY
PREFILLED_SYRINGE | INTRAVENOUS | Status: AC
Start: 2016-09-28 — End: 2016-09-28
  Filled 2016-09-28: qty 10

## 2016-09-28 MED ORDER — ONDANSETRON HCL 4 MG/2ML IJ SOLN
INTRAMUSCULAR | Status: AC
  Filled 2016-09-28: qty 2

## 2016-09-28 MED ORDER — ALVIMOPAN 12 MG PO CAPS: 12.0000 mg | ORAL_CAPSULE | Freq: Two times a day (BID) | ORAL | Status: DC

## 2016-09-28 MED ORDER — ROCURONIUM BROMIDE 10 MG/ML (PF) SYRINGE
PREFILLED_SYRINGE | INTRAVENOUS | Status: DC | PRN
  Administered 2016-09-28: 20 mg via INTRAVENOUS
  Administered 2016-09-28: 50 mg via INTRAVENOUS
  Administered 2016-09-28 (×4): 20 mg via INTRAVENOUS

## 2016-09-28 MED ORDER — LACTATED RINGERS IV SOLN: 1000.0000 mL | Freq: Three times a day (TID) | INTRAVENOUS | Status: DC | PRN

## 2016-09-28 MED ORDER — KETAMINE HCL 10 MG/ML IJ SOLN
INTRAMUSCULAR | Status: AC
  Filled 2016-09-28: qty 1

## 2016-09-28 MED ORDER — ADULT MULTIVITAMIN W/MINERALS CH
1.0000 | ORAL_TABLET | Freq: Every day | ORAL | Status: DC
  Administered 2016-09-29 – 2016-10-02 (×4): 1 via ORAL
  Filled 2016-09-28 (×5): qty 1

## 2016-09-28 MED ORDER — PROPOFOL 10 MG/ML IV BOLUS
INTRAVENOUS | Status: AC
  Filled 2016-09-28: qty 20

## 2016-09-28 MED ORDER — LACTATED RINGERS IV SOLN
INTRAVENOUS | Status: DC | PRN
  Administered 2016-09-28 (×3): via INTRAVENOUS

## 2016-09-28 MED ORDER — PRAVASTATIN SODIUM 20 MG PO TABS
20.0000 mg | ORAL_TABLET | Freq: Every day | ORAL | Status: DC
  Administered 2016-09-28 – 2016-10-01 (×4): 20 mg via ORAL
  Filled 2016-09-28 (×4): qty 1

## 2016-09-28 MED ORDER — LIDOCAINE 2% (20 MG/ML) 5 ML SYRINGE
INTRAMUSCULAR | Status: DC | PRN
  Administered 2016-09-28: 1.5 mg/kg/h via INTRAVENOUS

## 2016-09-28 MED ORDER — CARVEDILOL 25 MG PO TABS
25.0000 mg | ORAL_TABLET | Freq: Two times a day (BID) | ORAL | Status: DC
  Administered 2016-09-28 – 2016-10-02 (×8): 25 mg via ORAL
  Filled 2016-09-28 (×6): qty 1
  Filled 2016-09-28 (×2): qty 2

## 2016-09-28 MED ORDER — LACTATED RINGERS IV SOLN
INTRAVENOUS | Status: DC | PRN
  Administered 2016-09-28 (×2): via INTRAVENOUS

## 2016-09-28 MED ORDER — ACETAMINOPHEN 500 MG PO TABS
1000.0000 mg | ORAL_TABLET | ORAL | Status: AC
Start: 2016-09-28 — End: 2016-09-28
  Administered 2016-09-28: 1000 mg via ORAL
  Filled 2016-09-28: qty 2

## 2016-09-28 MED ORDER — LIDOCAINE 2% (20 MG/ML) 5 ML SYRINGE
INTRAMUSCULAR | Status: DC | PRN
  Administered 2016-09-28: 100 mg via INTRAVENOUS

## 2016-09-28 MED ORDER — SODIUM CHLORIDE 0.9 % IV SOLN: 250.0000 mL | INTRAVENOUS | Status: DC | PRN

## 2016-09-28 MED ORDER — HYDROMORPHONE HCL 1 MG/ML IJ SOLN
0.2500 mg | INTRAMUSCULAR | Status: DC | PRN
  Administered 2016-09-28 (×2): 0.5 mg via INTRAVENOUS

## 2016-09-28 MED ORDER — LACTATED RINGERS IR SOLN
Status: DC | PRN
  Administered 2016-09-28: 1000 mL

## 2016-09-28 MED ORDER — CEFOTETAN DISODIUM-DEXTROSE 2-2.08 GM-% IV SOLR
2.0000 g | Freq: Once | INTRAVENOUS | Status: AC
  Administered 2016-09-28: 2 g via INTRAVENOUS

## 2016-09-28 MED ORDER — SODIUM CHLORIDE 0.9 % IV SOLN
250.0000 mL | INTRAVENOUS | Status: DC | PRN
Start: 2016-09-28 — End: 2016-09-29

## 2016-09-28 MED ORDER — CEFOTETAN DISODIUM-DEXTROSE 2-2.08 GM-% IV SOLR
INTRAVENOUS | Status: AC
  Filled 2016-09-28: qty 50

## 2016-09-28 MED ORDER — DEXAMETHASONE SODIUM PHOSPHATE 10 MG/ML IJ SOLN
INTRAMUSCULAR | Status: DC | PRN
  Administered 2016-09-28: 4 mg via INTRAVENOUS

## 2016-09-28 MED ORDER — ACETAMINOPHEN 500 MG PO TABS: 1000.0000 mg | ORAL_TABLET | Freq: Three times a day (TID) | ORAL | Status: DC

## 2016-09-28 MED ORDER — ENOXAPARIN SODIUM 40 MG/0.4ML ~~LOC~~ SOLN
40.0000 mg | SUBCUTANEOUS | Status: DC
  Administered 2016-09-29 – 2016-10-02 (×4): 40 mg via SUBCUTANEOUS
  Filled 2016-09-28 (×4): qty 0.4

## 2016-09-28 MED ORDER — MIDAZOLAM HCL 5 MG/5ML IJ SOLN
INTRAMUSCULAR | Status: DC | PRN
  Administered 2016-09-28 (×2): 1 mg via INTRAVENOUS

## 2016-09-28 MED ORDER — KETAMINE HCL 10 MG/ML IJ SOLN
INTRAMUSCULAR | Status: DC | PRN
  Administered 2016-09-28: 20 mg via INTRAVENOUS
  Administered 2016-09-28: 30 mg via INTRAVENOUS
  Administered 2016-09-28 (×2): 20 mg via INTRAVENOUS

## 2016-09-28 MED ORDER — OXYCODONE HCL 5 MG PO TABS: 5.0000 mg | ORAL_TABLET | Freq: Four times a day (QID) | ORAL | 0 refills | Status: DC | PRN

## 2016-09-28 MED ORDER — LIP MEDEX EX OINT: 1.0000 "application " | TOPICAL_OINTMENT | Freq: Two times a day (BID) | CUTANEOUS | Status: DC

## 2016-09-28 MED ORDER — ETOMIDATE 2 MG/ML IV SOLN
INTRAVENOUS | Status: AC
  Filled 2016-09-28: qty 10

## 2016-09-28 SURGICAL SUPPLY — 120 items
APPLIER CLIP 5 13 M/L LIGAMAX5 (MISCELLANEOUS)
APPLIER CLIP ROT 10 11.4 M/L (STAPLE)
APR CLP MED LRG 11.4X10 (STAPLE)
APR CLP MED LRG 5 ANG JAW (MISCELLANEOUS)
BLADE EXTENDED COATED 6.5IN (ELECTRODE) IMPLANT
CANNULA REDUC XI 12-8 STAPL (CANNULA) ×1
CANNULA REDUC XI 12-8MM STAPL (CANNULA) ×1
CANNULA REDUCER 12-8 DVNC XI (CANNULA) ×1 IMPLANT
CELLS DAT CNTRL 66122 CELL SVR (MISCELLANEOUS) IMPLANT
CHLORAPREP W/TINT 26ML (MISCELLANEOUS) ×3 IMPLANT
CLIP APPLIE 5 13 M/L LIGAMAX5 (MISCELLANEOUS) IMPLANT
CLIP APPLIE ROT 10 11.4 M/L (STAPLE) IMPLANT
CLIP LIGATING HEM O LOK PURPLE (MISCELLANEOUS) IMPLANT
CLIP LIGATING HEMO O LOK GREEN (MISCELLANEOUS) IMPLANT
COUNTER NEEDLE 20 DBL MAG RED (NEEDLE) ×1 IMPLANT
COVER SURGICAL LIGHT HANDLE (MISCELLANEOUS) ×5 IMPLANT
COVER TIP SHEARS 8 DVNC (MISCELLANEOUS) ×1 IMPLANT
COVER TIP SHEARS 8MM DA VINCI (MISCELLANEOUS) ×4
DECANTER SPIKE VIAL GLASS SM (MISCELLANEOUS) ×3 IMPLANT
DEVICE TROCAR PUNCTURE CLOSURE (ENDOMECHANICALS) ×2 IMPLANT
DRAIN CHANNEL 19F RND (DRAIN) ×2 IMPLANT
DRAPE ARM DVNC X/XI (DISPOSABLE) ×4 IMPLANT
DRAPE COLUMN DVNC XI (DISPOSABLE) ×1 IMPLANT
DRAPE DA VINCI XI ARM (DISPOSABLE) ×8
DRAPE DA VINCI XI COLUMN (DISPOSABLE) ×2
DRAPE SURG IRRIG POUCH 19X23 (DRAPES) ×3 IMPLANT
DRSG OPSITE POSTOP 4X10 (GAUZE/BANDAGES/DRESSINGS) IMPLANT
DRSG OPSITE POSTOP 4X6 (GAUZE/BANDAGES/DRESSINGS) ×2 IMPLANT
DRSG OPSITE POSTOP 4X8 (GAUZE/BANDAGES/DRESSINGS) IMPLANT
DRSG TEGADERM 2-3/8X2-3/4 SM (GAUZE/BANDAGES/DRESSINGS) ×6 IMPLANT
DRSG TEGADERM 4X4.75 (GAUZE/BANDAGES/DRESSINGS) ×2 IMPLANT
ELECT BLADE TIP CTD 4 INCH (ELECTRODE) ×2 IMPLANT
ELECT PENCIL ROCKER SW 15FT (MISCELLANEOUS) ×2 IMPLANT
ELECT REM PT RETURN 15FT ADLT (MISCELLANEOUS) ×3 IMPLANT
ELECT REM PT RETURN 9FT ADLT (ELECTROSURGICAL) ×3
ELECTRODE REM PT RTRN 9FT ADLT (ELECTROSURGICAL) IMPLANT
ENDOLOOP SUT PDS II  0 18 (SUTURE)
ENDOLOOP SUT PDS II 0 18 (SUTURE) IMPLANT
EVACUATOR SILICONE 100CC (DRAIN) IMPLANT
GAUZE SPONGE 2X2 8PLY STRL LF (GAUZE/BANDAGES/DRESSINGS) ×1 IMPLANT
GAUZE SPONGE 4X4 12PLY STRL (GAUZE/BANDAGES/DRESSINGS) IMPLANT
GLOVE ECLIPSE 8.0 STRL XLNG CF (GLOVE) ×12 IMPLANT
GLOVE INDICATOR 8.0 STRL GRN (GLOVE) ×12 IMPLANT
GOWN STRL REUS W/TWL XL LVL3 (GOWN DISPOSABLE) ×17 IMPLANT
GRASPER ENDOPATH ANVIL 10MM (MISCELLANEOUS) IMPLANT
HOLDER FOLEY CATH W/STRAP (MISCELLANEOUS) ×3 IMPLANT
IRRIG SUCT STRYKERFLOW 2 WTIP (MISCELLANEOUS) ×3
IRRIGATION SUCT STRKRFLW 2 WTP (MISCELLANEOUS) ×1 IMPLANT
KIT PROCEDURE DA VINCI SI (MISCELLANEOUS)
KIT PROCEDURE DVNC SI (MISCELLANEOUS) IMPLANT
LEGGING LITHOTOMY PAIR STRL (DRAPES) ×3 IMPLANT
LUBRICANT JELLY K Y 4OZ (MISCELLANEOUS) ×3 IMPLANT
NDL INSUFFLATION 14GA 120MM (NEEDLE) ×1 IMPLANT
NDL SPNL 22GX7 QUINCKE BK (NEEDLE) IMPLANT
NEEDLE INSUFFLATION 14GA 120MM (NEEDLE) ×3 IMPLANT
NEEDLE SPNL 22GX7 QUINCKE BK (NEEDLE) ×3 IMPLANT
PACK CARDIOVASCULAR III (CUSTOM PROCEDURE TRAY) ×3 IMPLANT
PACK COLON (CUSTOM PROCEDURE TRAY) ×3 IMPLANT
PAD POSITIONING PINK XL (MISCELLANEOUS) ×5 IMPLANT
PORT LAP GEL ALEXIS MED 5-9CM (MISCELLANEOUS) IMPLANT
POUCH OSTOMY 1 PC DRNBL  2 1/2 (OSTOMY) IMPLANT
POUCH OSTOMY 2 1/2 (OSTOMY) ×3
RELOAD STAPLE 45 BLU REG DVNC (STAPLE) IMPLANT
RELOAD STAPLE 45 GRN THCK DVNC (STAPLE) IMPLANT
RETRACTOR LONE STAR DISPOSABLE (INSTRUMENTS) ×3 IMPLANT
RETRACTOR STAY HOOK 5MM (MISCELLANEOUS) ×3 IMPLANT
RETRACTOR WND ALEXIS 18 MED (MISCELLANEOUS) ×1 IMPLANT
RTRCTR WOUND ALEXIS 18CM MED (MISCELLANEOUS)
SCISSORS LAP 5X35 DISP (ENDOMECHANICALS) ×3 IMPLANT
SEAL CANN UNIV 5-8 DVNC XI (MISCELLANEOUS) ×3 IMPLANT
SEAL XI 5MM-8MM UNIVERSAL (MISCELLANEOUS) ×6
SEALER VESSEL DA VINCI XI (MISCELLANEOUS) ×2
SEALER VESSEL EXT DVNC XI (MISCELLANEOUS) ×1 IMPLANT
SLEEVE ADV FIXATION 5X100MM (TROCAR) IMPLANT
SOLUTION ELECTROLUBE (MISCELLANEOUS) ×3 IMPLANT
SPONGE GAUZE 2X2 STER 10/PKG (GAUZE/BANDAGES/DRESSINGS) ×2
SPONGE LAP 18X18 X RAY DECT (DISPOSABLE) ×2 IMPLANT
STAPLER 45 BLU RELOAD XI (STAPLE) ×2 IMPLANT
STAPLER 45 BLUE RELOAD XI (STAPLE) ×4
STAPLER 45 GREEN RELOAD XI (STAPLE)
STAPLER 45 GRN RELOAD XI (STAPLE) IMPLANT
STAPLER CANNULA SEAL DVNC XI (STAPLE) ×1 IMPLANT
STAPLER CANNULA SEAL XI (STAPLE) ×2
STAPLER SHEATH (SHEATH) ×2
STAPLER SHEATH ENDOWRIST DVNC (SHEATH) ×1 IMPLANT
SUT MNCRL AB 4-0 PS2 18 (SUTURE) ×7 IMPLANT
SUT PDS AB 0 CT1 36 (SUTURE) ×2 IMPLANT
SUT PDS AB 1 CT1 27 (SUTURE) ×2 IMPLANT
SUT PDS AB 1 CTX 36 (SUTURE) ×6 IMPLANT
SUT PDS AB 1 TP1 96 (SUTURE) ×3 IMPLANT
SUT PDS AB 2-0 CT2 27 (SUTURE) IMPLANT
SUT PROLENE 0 CT 2 (SUTURE) ×3 IMPLANT
SUT PROLENE 2 0 BLUE (SUTURE) ×2 IMPLANT
SUT PROLENE 2 0 KS (SUTURE) ×1 IMPLANT
SUT PROLENE 2 0 SH DA (SUTURE) IMPLANT
SUT SILK 2 0 (SUTURE) ×3
SUT SILK 2 0 SH CR/8 (SUTURE) ×3 IMPLANT
SUT SILK 2-0 18XBRD TIE 12 (SUTURE) ×1 IMPLANT
SUT SILK 3 0 (SUTURE) ×3
SUT SILK 3 0 SH CR/8 (SUTURE) ×3 IMPLANT
SUT SILK 3-0 18XBRD TIE 12 (SUTURE) ×1 IMPLANT
SUT V-LOC BARB 180 2/0GR6 GS22 (SUTURE)
SUT VIC AB 2-0 SH 18 (SUTURE) ×10 IMPLANT
SUT VIC AB 3-0 SH 18 (SUTURE) IMPLANT
SUT VIC AB 3-0 SH 27 (SUTURE)
SUT VIC AB 3-0 SH 27XBRD (SUTURE) IMPLANT
SUT VICRYL 0 UR6 27IN ABS (SUTURE) ×7 IMPLANT
SUT VLOC 180 2-0 9IN GS21 (SUTURE) ×4 IMPLANT
SUTURE V-LC BRB 180 2/0GR6GS22 (SUTURE) IMPLANT
SYR 10ML LL (SYRINGE) ×3 IMPLANT
SYS LAPSCP GELPORT 120MM (MISCELLANEOUS)
SYSTEM LAPSCP GELPORT 120MM (MISCELLANEOUS) IMPLANT
TAPE UMBILICAL COTTON 1/8X30 (MISCELLANEOUS) ×5 IMPLANT
TOWEL OR 17X26 10 PK STRL BLUE (TOWEL DISPOSABLE) IMPLANT
TOWEL OR NON WOVEN STRL DISP B (DISPOSABLE) ×3 IMPLANT
TRAY FOLEY W/METER SILVER 16FR (SET/KITS/TRAYS/PACK) ×3 IMPLANT
TROCAR ADV FIXATION 5X100MM (TROCAR) ×3 IMPLANT
TUBING CONNECTING 10 (TUBING) ×2 IMPLANT
TUBING CONNECTING 10' (TUBING) ×2
TUBING INSUFFLATION 10FT LAP (TUBING) ×3 IMPLANT

## 2016-09-28 NOTE — Op Note (Signed)
09/28/2016  2:16 PM  PATIENT:  Patrick [name]  66 y.o. male  Patient Care Team: Sela Hua, MD as PCP - General (Family Medicine) Kyung Rudd, MD as Consulting Physician (Radiation Oncology) Truitt Merle, MD as Consulting Physician (Hematology) Tania Ade, RN as Registered Nurse Minus Breeding, MD as Consulting Physician (Cardiology) Deloria Lair, NP as [place] Management Michael Boston, MD as Consulting Physician (General Surgery) Ardis Hughs, MD as Attending Physician (Urology)  PRE-OPERATIVE DIAGNOSIS:  Very low rectal cancer  POST-OPERATIVE DIAGNOSIS:  Very low rectal cancer  PROCEDURE:   XI ROBOT ABDOMINOPERINEAL RESECTION  PERMANENT COLOSTOMY   SURGEON:  Adin Hector, MD  ASSISTANT: Gurney Maxin, MD    ANESTHESIA:   General and Local anesthetic as a field block}  ERAS PATHWAY  EBL:  Total I/O In: 4000 [I.V.:3500; IV Piggyback:500] Out: 725 [Urine:275; Blood:450] "see anesthesia record"  Delay start of Pharmacological VTE agent (>24hrs) due to surgical blood loss or risk of bleeding:  no  DRAINS: 19 Fr Blake drain in the pelvis   SPECIMEN:  "RECTOSIGMOID COLON WITH ANUS.  2.  New right posteriolateral margin.  3.  New right anteriolateral margin  DISPOSITION OF SPECIMEN:  PATHOLOGY  COUNTS:  YES  PLAN OF CARE: Admit to inpatient   PATIENT DISPOSITION:  PACU - hemodynamically stable.  INDICATION:    66 year old male with worsening pain incontinence and bleeding.  Found a very thick stool rectal cancer.  Underwent neoadjuvant radiation chemotherapy.  Lost to follow-up due to family and other issues.  Switched from a Museum/gallery conservator to me.  Felt the area was extremely low on the sphincters and with loss of sphincter function and incontinence, I did not think he was a candidate for sphincter-sparing surgery.  Therefore I recommended abdominal.  Resection with colostomy.  I recommended segmental resection:  The anatomy &  physiology of the digestive tract was discussed.  The pathophysiology was discussed.  Natural history risks without surgery was discussed.   I worked to give an overview of the disease and the frequent need to have multispecialty involvement.  I feel the risks of no intervention will lead to serious problems that outweigh the operative risks; therefore, I recommended a partial colectomy to remove the pathology.  Laparoscopic & open techniques were discussed.  Need for permanent colostomy discussed.  Patient had meeting with ostomy nurse preoperatively.  Risks such as bleeding, infection, abscess, leak, reoperation, hernia, heart attack, death, and other risks were discussed.  I noted a good likelihood this will help address the problem.   Goals of post-operative recovery were discussed as well.  We will work to minimize complications.  Educational materials on the pathology had been given in the office.  Questions were answered.    The patient expressed understanding & wished to proceed with surgery.  OR FINDINGS:   Patient had ulcerated scar suspicious for some remaining tumor in the distal anterior prior primarily right anterior rectum.  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  No obvious metastatic disease on visceral parietal peritoneum or liver.    PROCEDURE:  Informed consent was confirmed.  The patient underwent general anaesthesia without difficulty.  The patient was positioned appropriately.  VTE prevention in place.  The patient's abdomen was clipped, prepped, & draped in a sterile fashion.  Surgical  timeout confirmed our plan.  The patient was positioned in reverse Trendelenburg.  Abdominal entry was gained using optical entry technique in the  right upper abdomen.  Entry was clean.  I induced carbon dioxide insufflation.  Camera  inspection revealed no injury.  Extra ports were carefully placed under direct laparoscopic visualization.  I reflected the greater omentum and the upper abdomen the small bowel in the upper abdomen.  The patient was carefully positioned.  The Intuitive daVinci robot was carefully docked with camera & instruments carefully placed.  The patient had no evidence of visceral or parietal peritoneal metastatic disease.  No evidence of metastasis.    I freed off some lateral adhesions of the sigmoid colon to help straighten it out.  It was somewhat redundant and corkscrewed.  I was able to elevate the rectosigmoid colon.  I scored the base of peritoneum of the medial side of the mesentery of the left colon from the ligament of Treitz to the peritoneal reflection of the mid rectum.   I elevated the sigmoid mesentery and entered into the retro-mesenteric plane. We were able to identify the left ureter and gonadal vessels.  Patient had somewhat torturous iliac vessels.  Prominent internal iliac vessels as well.  We kept those posterior within the retroperitoneum and elevated the left colon mesentery off that. I did isolated IMA pedicle but did not ligate it yet.  I continued distally and got into the avascular plane posterior to the mesorectum. This allowed me to help mobilize the rectum as well by freeing the mesorectum off the sacrum.  I mobilized the peritoneal coverings towards the peritoneal reflection on both the right and left sides of the rectum.  I stayed away from the right and left ureters.  I kept the lateral vascular pedicles to the rectum intact.  Proceed with total mesorectal excision.  Gradually exposed the rectum and freed it off its attachments to the pelvis.  Initially focused posteriorly.  Patient had a very narrow pelvis with a thickened fibrotic mesorectum.  Works pain was limited.  However I was able to eventually elevate the mesorectum sleeve intact off the presacral fascia.  I came around  anteriorly and help free the anterior rectum off the seminal vesicles and prostate.  Patient had edematous fibrotic tissues consistent with his neoadjuvant shemoradiation therapy I switched over to focusing on isolating and transecting the lateral stalks.  Eventually came more distally until I had pelvic floor laterally and posteriorly.  Did have some oozing on the distal sacral and coccygeal presacral space consistent with a branching vein.  I was able to isolate control with bipolar energy.  Pressure held.  Improved hemostasis.  I did digital rectal exam to confirm that I was at the level of the pelvic floor within centimeter of the sphincter complex.   He had obvious radiation change and inflammation.  After confirming the left ureter was out of the way, I went ahead and ligated the inferior mesenteric artery pedicle just near its takeoff from the aorta.   took the venous pedicle as well, sparing the left colic vein.  I ended up transecting the superior hemorrhoidal and left colic pedicles off the inferior mesenteric artery, and there very proximal takeoff within 2 cm of the aorta.  I kept a prominent marginal artery intact.  I transected the mesentery radially with vessel sealer to the descending colon an area that had healthy pink supply and could reach the left upper quadrant without much laxity.  I transected  across this mid descending colon with a robotic stapler.  I did reinspection confirmed I had good mesorectum resection circumferentially as well as I can go.  I then transitioned to perineal resection.  Patient's place and I lithotomy.  He used a Acupuncturist to help explore those the perianal region.  I transected the skin several simmers away from the anal verge and a biconcave ellipse fashion removing any potential perianal skin.  Came through the subcutaneous tease tissues.  Focused undergoing posteriorly to the pelvic floor and entered into the true pelvis just distal and anterior to the  coccygeal tip.  Use cautery to help transect pelvic floor on the posterior two thirds circumference.  Attention was able to bring out the rectosigmoid specimen.  Came around anteriorly.  Left anterior aspect came around with pelvic floor cuff.  As expected right lateral and right anterior tissues were thickened and scarred.  Focused on coming around those and try to have decent lateral margins.  Ensure removed.  I had been some thinned out areas that I reapproximated with silk suture for anatomical purposes.  At inspection of the pelvis.  Hemostasis was good on the presacral space.  I did leave some packing in there.  I confirmed that mesorectum dissection was adequate was very narrowed and the anal canal.  I took right lateral and right anterior margins on the pelvic floor and lateral fat to confirm had negative margins.  I closed the perineal wound with 20V LOC serrated absorbable suture from each corner of the pelvic floor anteriorly and posteriorly overlapping.  I closed the very deep perineal wound in layers with 2-0 Vicryl interrupted suture as well as deep dermal sutures.  Three layers.  Placed an umbilical tape wicks down to the level of the muscle closure.  His tissues were decent quality without tension, held off on any mesh reinforcement.  A closed the skin with interrupted Monocryl suture.  Sterile dressing applied.  Dr. Cherlyn Roberts had helped bring up the end descending colon through the left upper quadrant premarked colostomy area.  Two testicular disc of skin and then opened up the fascia anteriorly a cruciate fashion.  Was able to eviscerate 6 cm of descending colon intact.  I had passed a drain up through the perineal wound.  Tip was brought out the right lateral port site.  At inspection per he did not have any redundant peritoneum on the retroperitoneum to try and close.  Thickened without any mobility.  She had no omentum for an omental flap patching of the region.  I did not close  irrigation.  Hemostasis was good.  Colostomy mesentery not twisted nor torturous.  I removed CO2 gas out through the ports.  I secured the drain with 2-0 Prolene suture.  A closed fascia at the robotic sites with #1 PDS at the staple port and 0 Vicryl on the 8 mm ports.  I closed the 41mm port sites using Monocryl stitch and sterile dressing.   sterile dressings applied.  Left upper quadrant colostomy matured.  #1 PDS laterally medially to help reinforce the fashion and tacked the colostomy at the level of the fascia.  The matured the colostomy in a classic [place] fashion with 2-0 Vicryl interrupted sutures.  Got a 4 cm rosebud that aimed inferiorly.  Finger easily intubated across it without twisting or torsion.  Patient had healthy bleeding mucosa.  Hemostasis was good at the end the case.  Colostomy appliance placed.  Patient is being extubated go  to recovery room. I discussed postop care with the patient in detail the office & in the holding area. Instructions are written. I tried to reach the patient's sister per his request.  Got voicemail.  Left a message on her [phone].  I will try again   Adin Hector, M.D., F.A.C.S. Gastrointestinal and Minimally Invasive Surgery [facility] [address] [place], [place] [address] [phone] Main / Paging  09/28/2016 2:47 PM

## 2016-09-28 NOTE — H&P (View-Only) (Signed)
HARM JOU 09/08/2016 12:44 PM Location: [facility] Patient #: [id number] DOB: [birth year] Single / Language: Cleophus Molt / Race: White Male  History of Present Illness Adin Hector MD; 09/08/2016 1:53 PM) The patient is a 66 year old male who presents with colorectal cancer. Note for "Colorectal cancer": ` ` ` Patient returns for second opinion on very low rectal cancer.  66 year old male found to have worsening bleeding and bowel changes. Found to have a very distal rectal cancer. MRI showing invasion or at least abutment to the sphincters. Underwent neoadjuvant urination therapy. Tentative plan was abdominal perineal resection. Patient had issues with very poor follow-up and missing clinic visits. Was fired by prior Psychologist, sport and exercise. Patient noted he is having some family crisis is taking care of his parents. He says that center better controlled wishes to readdress. There was discussion about going to [facility], but he wished to reconsider being with our group. I was willing to see him.  Patient notes that he is having loose bowel movements 2 or 3 times a day. Often has accidents and and fecal incontinence with this. He is has less bleeding. No fevers chills or sweats. He was seen by cardiology. Moderate risk with his history of heart failure but not prohibitive. He is not on blood thinners. He claims he can walk a half hour without any difficulty. He did have an MRI to follow-up on renal lesion which was concerning on the left side. There is discussion about urology evaluation. That has not happened yet. Concern of possible cirrhosis. Seems borderline to me. Patient denies any alcohol use. Denies any liver problems or hepatitis in the past. Patient comes in today by himself. Denies any fevers or chills. He was hoping to avoid a colostomy but understands the importance of need for negative margins and has come to terms with it. Patient does not smoke. He  is not a diabetic. He notes he has less issues of urination. Less urgency and frequency. Better flow.  No personal nor family history of inflammatory bowel disease, irritable bowel syndrome, allergy such as Celiac Sprue, dietary/dairy problems, colitis, ulcers nor gastritis. No recent sick contacts/gastroenteritis. No travel outside the country. No changes in diet. No dysphagia to solids or liquids. No significant heartburn or reflux. No hematochezia, hematemesis, coffee ground emesis. No evidence of prior gastric/peptic ulceration.   Allergies (Yasmin Izola Price, CMA; 09/08/2016 12:44 PM) No Known Allergies 03/21/2016 Allergies Reconciled  Medication History (Yasmin Roberson, CMA; 09/08/2016 12:45 PM) Neomycin Sulfate (500MG  Tablet, 2 (two) Tablet Oral SEE NOTE, Taken starting 07/11/2016) Active. (TAKE TWO TABLETS AT 2 PM, 3 PM, AND 10 PM THE DAY PRIOR TO SURGERY) Flagyl (500MG  Tablet, 2 (two) Tablet Oral SEE NOTE, Taken starting 07/11/2016) Active. (Take at 2pm, 3pm, and 10pm the day prior to your colon operation) Carvedilol (25MG  Tablet, Oral) Active. Ferrous Sulfate (325 (65 Fe)MG Tablet, Oral) Active. Finasteride (5MG  Tablet, Oral) Active. Losartan Potassium (50MG  Tablet, Oral) Active. Lovastatin (20MG  Tablet, Oral) Active. Mirtazapine (15MG  Tablet Disint, Oral) Active. Omega-3-acid Ethyl Esters (1GM Capsule, Oral) Active. Potassium Chloride Crys ER (20MEQ Tablet ER, Oral) Active. Tamsulosin HCl (0.4MG  Capsule, Oral) Active. Torsemide (20MG  Tablet, Oral) Active. TraZODone HCl (100MG  Tablet, Oral) Active. Vitamin C (1000MG  Tablet, Oral) Active. Aspirin (81MG  Tablet DR, Oral) Active. Multivitamin Adult (Oral) Active. Oxycodone-Acetaminophen (5-325MG  Tablet, Oral) Active. Medications Reconciled     Review of Systems Adin Hector MD; 09/08/2016 1:53 PM) General Present- Fatigue. Not Present- Appetite Loss, Chills, Fever, Night Sweats, Weight  Gain and  Weight Loss. Skin Present- Non-Healing Wounds. Not Present- Change in Wart/Mole, Dryness, Hives, Jaundice, New Lesions, Rash and Ulcer. HEENT Not Present- Earache, Hearing Loss, Hoarseness, Nose Bleed, Oral Ulcers, Ringing in the Ears, Seasonal Allergies, Sinus Pain, Sore Throat, Visual Disturbances, Wears glasses/contact lenses and Yellow Eyes. Respiratory Not Present- Bloody sputum, Chronic Cough, Difficulty Breathing, Snoring and Wheezing. Breast Not Present- Breast Mass, Breast Pain, Nipple Discharge and Skin Changes. Cardiovascular Present- Shortness of Breath and Swelling of Extremities. Not Present- Chest Pain, Difficulty Breathing Lying Down, Leg Cramps, Palpitations and Rapid Heart Rate. Gastrointestinal Present- Constipation. Not Present- Abdominal Pain, Bloating, Bloody Stool, Change in Bowel Habits, Chronic diarrhea, Difficulty Swallowing, Excessive gas, Gets full quickly at meals, Hemorrhoids, Indigestion, Nausea, Rectal Pain and Vomiting. Male Genitourinary Present- Change in Urinary Stream and Urine Leakage. Not Present- Blood in Urine, Frequency, Impotence, Nocturia, Painful Urination and Urgency. Musculoskeletal Present- Joint Pain and Joint Stiffness. Not Present- Back Pain, Muscle Pain, Muscle Weakness and Swelling of Extremities. Neurological Present- Weakness. Not Present- Decreased Memory, Fainting, Headaches, Numbness, Seizures, Tingling, Tremor and Trouble walking. Psychiatric Present- Anxiety and Depression. Not Present- Bipolar, Change in Sleep Pattern, Fearful and Frequent crying. Endocrine Not Present- Cold Intolerance, Excessive Hunger, Hair Changes, Heat Intolerance and New Diabetes. Hematology Present- Easy Bruising. Not Present- Blood Thinners, Excessive bleeding, Gland problems, HIV and Persistent Infections. All other systems negative  Vitals (Yasmin Roberson CMA; 09/08/2016 12:44 PM) 09/08/2016 12:44 PM Weight: 202.2 lb Height: 69in Body Surface Area: 2.08 m  Body Mass Index: 29.86 kg/m  Temp.: 97.74F  Pulse: 73 (Regular)  BP: 116/68 (Sitting, Left Arm, Standard)      Physical Exam Adin Hector MD; 09/08/2016 1:54 PM)  General Mental Status-Alert. General Appearance-Not in acute distress, Not Sickly. Orientation-Oriented X3. Hydration-Well hydrated. Voice-Normal. Note: Calm. Pleasant. Hygiene fair. Speaks mildly slowly  Integumentary Global Assessment Upon inspection and palpation of skin surfaces of the - Axillae: non-tender, no inflammation or ulceration, no drainage. and Distribution of scalp and body hair is normal. General Characteristics Temperature - normal warmth is noted.  Head and Neck Head-normocephalic, atraumatic with no lesions or palpable masses. Face Global Assessment - atraumatic, no absence of expression. Neck Global Assessment - no abnormal movements, no bruit auscultated on the right, no bruit auscultated on the left, no decreased range of motion, non-tender. Trachea-midline. Thyroid Gland Characteristics - non-tender.  Eye Eyeball - Left-Extraocular movements intact, No Nystagmus. Eyeball - Right-Extraocular movements intact, No Nystagmus. Cornea - Left-No Hazy. Cornea - Right-No Hazy. Sclera/Conjunctiva - Left-No scleral icterus, No Discharge. Sclera/Conjunctiva - Right-No scleral icterus, No Discharge. Pupil - Left-Direct reaction to light normal. Pupil - Right-Direct reaction to light normal.  ENMT Ears Pinna - Left - no drainage observed, no generalized tenderness observed. Right - no drainage observed, no generalized tenderness observed. Nose and Sinuses External Inspection of the Nose - no destructive lesion observed. Inspection of the nares - Left - quiet respiration. Right - quiet respiration. Mouth and Throat Lips - Upper Lip - no fissures observed, no pallor noted. Lower Lip - no fissures observed, no pallor noted. Nasopharynx - no discharge  present. Oral Cavity/Oropharynx - Tongue - no dryness observed. Oral Mucosa - no cyanosis observed. Hypopharynx - no evidence of airway distress observed.  Chest and Lung Exam Inspection Movements - Normal and Symmetrical. Accessory muscles - No use of accessory muscles in breathing. Palpation Palpation of the chest reveals - Non-tender. Auscultation Breath sounds - Normal and Clear.  Cardiovascular Auscultation Rhythm -  Regular. Murmurs & Other Heart Sounds - Auscultation of the heart reveals - No Murmurs and No Systolic Clicks.  Abdomen Inspection Inspection of the abdomen reveals - No Visible peristalsis and No Abnormal pulsations. Umbilicus - No Bleeding, No Urine drainage. Palpation/Percussion Palpation and Percussion of the abdomen reveal - Soft, Non Tender, No Rebound tenderness, No Rigidity (guarding) and No Cutaneous hyperesthesia. Note: Abdomen soft. Nontender, nondistended. No guarding. Moderate diastases. Subtle impulse felt supraumbilically but no definite hernia felt  Male Genitourinary Sexual Maturity Tanner 5 - Adult hair pattern and Adult penile size and shape. Note: No inguinal hernias. Normal external genitalia. Epididymi, testes, and spermatic cords normal without any masses. No definite incision seen. Patient claims he had a prior inguinal hernia repair done.  Rectal Note: Nearly absent sphincter tone. Anterior midline rectal mass mobile. Most distal aspect is at least within 1 cm of the sphincters. Thick and hard but a little more mobile off the prostate. Posterior circumference not involved.  Perianal skin clean with fair hygiene. Leaking stool easily. No chronic skin changes or pruritus ani. No pilonidal disease. No fissure. No abscess/fistula. Normal sphincter tone. No external hemorrhoids. No condyloma warts. Tolerates digital rectal exam. Hemorrhoidal piles normal.  Peripheral Vascular Upper Extremity Inspection - Left - No Cyanotic nailbeds, Not  Ischemic. Right - No Cyanotic nailbeds, Not Ischemic.  Neurologic Neurologic evaluation reveals -normal attention span and ability to concentrate, able to name objects and repeat phrases. Appropriate fund of knowledge , normal sensation and normal coordination. Mental Status Affect - not angry, not paranoid. Cranial Nerves-Normal Bilaterally. Gait-Normal.  Neuropsychiatric Mental status exam performed with findings of-able to articulate well with normal speech/language, rate, volume and coherence, thought content normal with ability to perform basic computations and apply abstract reasoning and no evidence of hallucinations, delusions, obsessions or homicidal/suicidal ideation.  Musculoskeletal Global Assessment Spine, Ribs and Pelvis - no instability, subluxation or laxity. Right Upper Extremity - no instability, subluxation or laxity.  Lymphatic Head & Neck  General Head & Neck Lymphatics: Bilateral - Description - No Localized lymphadenopathy. Axillary  General Axillary Region: Bilateral - Description - No Localized lymphadenopathy. Femoral & Inguinal  Generalized Femoral & Inguinal Lymphatics: Left - Description - No Localized lymphadenopathy. Right - Description - No Localized lymphadenopathy.    Assessment & Plan Adin Hector MD; 09/08/2016 1:56 PM)  RECTAL ADENOCARCINOMA (C20) Impression: Primary low rectal cancer with evidence of involvement of sphincters for neoadjuvant therapy. Seems to be less so now but with very poor sphincter function and incontinence makes me highly skeptical he would tolerate an ultralow coloanal handsewn anastomosis anastomosis.  I agree with plan for abdominoperineal resection. Reasonable for a robotic/minimally invasive approach.  Cardiac clearance is done. Increased but not prohibitive.  MRI shows concerns of possible cirrhosis and portal hypertension. I Coner't see any evidence of hepatic dysfunction or any strong evidence of this.  Nonetheless nonsurgical management of his rectal cancer is not a good idea. We'll proceed with surgery. Low threshold to place an stepdown unit/ICU postoperatively.  Do not think the renal lesions ever been evaluated since it was instantly noted on his metastatic workup. We'll have urology see. Appointment made with Dr. Louis Meckel for tomorrow  Current Plans Pt Education - [place] (Salima Rumer): discussed with patient and provided information. Pt Education - [place] (Zaryia Markel): discussed with patient and provided information. KIDNEY LESION, NATIVE, LEFT (N28.9) Impression: Lesion in left kidney close to the hilum. Suspicious for possible malignancy.  We called [facility]. He had seen Dr. Louis Meckel in the past for issues of urinary retention improved with Flomax. Dr. Louis Meckel also has expertise in renal lesions. He had a sudden opening for tomorrow. We have added him on  The if patient needed surgery, ideally could do at the same time. However I Davon't know if that's feasible in the short-term notice and the need for the large abdominal perineal resection. We have a narrow window to do abdominoperineal resection.  PREOP COLON - ENCOUNTER FOR PREOPERATIVE EXAMINATION FOR GENERAL SURGICAL PROCEDURE (Z01.818)  Current Plans You are being scheduled for surgery- Our schedulers will call you.  You should hear from our office's scheduling department within 5 working days about the location, date, and time of surgery. We try to make accommodations for patient's preferences in scheduling surgery, but sometimes the OR schedule or the surgeon's schedule prevents [place] from making those accommodations.  If you have not heard from our office [phone]) in 5 working days, call the office and ask for your surgeon's nurse.  If you have other questions about your diagnosis, plan, or surgery, call the office and ask for your surgeon's nurse.  Written instructions provided Pt Education - Pamphlet Given  - Laparoscopic Colorectal Surgery: discussed with patient and provided information. Pt Education - [facility] Colon Bowel Prep 2015 Miralax/Antibiotics Continued Neomycin Sulfate 500MG , 2 (two) Tablet SEE NOTE, #6, 09/08/2016, No Refill. Local Order: TAKE TWO TABLETS AT 2 PM, 3 PM, AND 10 PM THE DAY PRIOR TO SURGERY Continued Flagyl 500MG , 2 (two) Tablet SEE NOTE, #6, 09/08/2016, No Refill. Local Order: Take at 2pm, 3pm, and 10pm the day prior to your colon operation The anatomy & physiology of the digestive tract was discussed. The pathophysiology of the colon was discussed. Natural history risks without surgery was discussed. I feel the risks of no intervention will lead to serious problems that outweigh the operative risks; therefore, I recommended a partial colectomy to remove the pathology. Minimally invasive (Robotic/Laparoscopic) & open techniques were discussed.  Risks such as bleeding, infection, abscess, leak, reoperation, possible ostomy, hernia, heart attack, death, and other risks were discussed. I noted a good likelihood this will help address the problem. Goals of post-operative recovery were discussed as well. Need for adequate nutrition, daily bowel regimen and healthy physical activity, to optimize recovery was noted as well. We will work to minimize complications. Educational materials were available as well. Questions were answered. The patient expresses understanding & wishes to proceed with surgery.  Pt Education - [facility] Colectomy post-op instructions: discussed with patient and provided information. Pt Education - [place] (Nazyia Gaugh): discussed with patient and provided information.  Adin Hector, M.D., F.A.C.S. Gastrointestinal and Minimally Invasive Surgery [facility] [address], [place], [place] [address] [phone] Main / Paging

## 2016-09-28 NOTE — Interval H&P Note (Signed)
History and Physical Interval Note:  09/28/2016 8:03 AM  Patrick [name]  has presented today for surgery, with the diagnosis of Very low rectal cancer  The various methods of treatment have been discussed with the patient and family. After consideration of risks, benefits and other options for treatment, the patient has consented to  Procedure(s): XI [place] (N/A) as a surgical intervention .  The patient's history has been reviewed, patient examined, no change in status, stable for surgery.  I have reviewed the patient's chart and labs.  Questions were answered to the patient's satisfaction.     Sheleen Conchas C.

## 2016-09-28 NOTE — Anesthesia Preprocedure Evaluation (Addendum)
Anesthesia Evaluation  Patient identified by MRN, date of birth, ID band Patient awake    Reviewed: Allergy & Precautions, NPO status , Patient's Chart, lab work & pertinent test results  Airway Mallampati: II  TM Distance: >3 FB Neck ROM: Full    Dental no notable dental hx.    Pulmonary neg pulmonary ROS, former smoker,    Pulmonary exam normal breath sounds clear to auscultation       Cardiovascular hypertension, Pt. on medications + CAD and +CHF  + dysrhythmias Atrial Fibrillation  Rhythm:Irregular Rate:Normal + Systolic murmurs 2v CAD with subtotal occlusion of small co-dominant RCA and borderline lesion in moderate-sized OM-1 2. 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 Normal cardiac output   Neuro/Psych negative neurological ROS  negative psych ROS   GI/Hepatic negative GI ROS, Neg liver ROS,   Endo/Other  negative endocrine ROS  Renal/GU Renal InsufficiencyRenal disease  negative genitourinary   Musculoskeletal negative musculoskeletal ROS (+)   Abdominal   Peds negative pediatric ROS (+)  Hematology  (+) anemia ,   Anesthesia Other Findings   Reproductive/Obstetrics negative OB ROS                            Anesthesia Physical Anesthesia Plan  ASA: IV  Anesthesia Plan: General   Post-op Pain Management:    Induction: Intravenous  Airway Management Planned: Oral ETT  Additional Equipment: Arterial line  Intra-op Plan:   Post-operative Plan: Possible Post-op intubation/ventilation  Informed Consent: I have reviewed the patients History and Physical, chart, labs and discussed the procedure including the risks, benefits and alternatives for the proposed anesthesia with the patient or authorized representative who has indicated his/her understanding and acceptance.   Dental advisory  given  Plan Discussed with: CRNA and Surgeon  Anesthesia Plan Comments:         Anesthesia Quick Evaluation

## 2016-09-28 NOTE — Anesthesia Procedure Notes (Signed)
Arterial Line Insertion Start/End5/06/2016 9:10 AM, 09/28/2016 9:10 AM Performed by: Myrtie Soman, anesthesiologist  Patient location: OR. Preanesthetic checklist: patient identified, IV checked, site marked, risks and benefits discussed, surgical consent, monitors and equipment checked, pre-op evaluation, timeout performed and anesthesia consent Lidocaine 1% used for infiltration Right, radial was placed Catheter size: 20 Fr Hand hygiene performed  and maximum sterile barriers used   Attempts: 2 Procedure performed without using ultrasound guided technique. Following insertion, dressing applied and Biopatch. Post procedure assessment: normal and unchanged  Patient tolerated the procedure well with no immediate complications.

## 2016-09-28 NOTE — Anesthesia Procedure Notes (Signed)
Procedure Name: Intubation Date/Time: 09/28/2016 8:30 AM Performed by: Lind Covert Pre-anesthesia Checklist: Patient identified, Emergency Drugs available, Suction available, Patient being monitored and Timeout performed Patient Re-evaluated:Patient Re-evaluated prior to inductionOxygen Delivery Method: Circle system utilized Preoxygenation: Pre-oxygenation with 100% oxygen Intubation Type: IV induction Ventilation: Mask ventilation without difficulty Laryngoscope Size: Mac and 4 Grade View: Grade II Tube size: 7.5 mm Number of attempts: 1 Airway Equipment and Method: Stylet Placement Confirmation: ETT inserted through vocal cords under direct vision,  positive ETCO2 and breath sounds checked- equal and bilateral (large floppy epiglottis) Secured at: 22 cm Tube secured with: Tape Dental Injury: Teeth and Oropharynx as per pre-operative assessment

## 2016-09-28 NOTE — Transfer of Care (Signed)
Immediate Anesthesia Transfer of Care Note  Patient: Patrick BALESTRIERI  Procedure(s) Performed: Procedure(s): XI ROBOT ABDOMINOPERINEAL RESECTION WITH PERMANENT COLOSTOMY ERAS PATHWAY (N/A)  Patient Location: PACU  Anesthesia Type:General  Level of Consciousness: sedated  Airway & Oxygen Therapy: Patient Spontanous Breathing and Patient connected to face mask oxygen  Post-op Assessment: Report given to RN and Post -op Vital signs reviewed and stable  Post vital signs: Reviewed and stable  Last Vitals:  Vitals:   09/28/16 0619  BP: 136/77  Pulse: 70  Resp: 18  Temp: 36.6 C    Last Pain:  Vitals:   09/28/16 0619  TempSrc: Oral      Patients Stated Pain Goal: 4 (85/99/23 4144)  Complications: No apparent anesthesia complications

## 2016-09-29 ENCOUNTER — Encounter (HOSPITAL_COMMUNITY): Payer: Self-pay | Admitting: Surgery

## 2016-09-29 LAB — CBC
HEMATOCRIT: 29.9 % — AB (ref 39.0–52.0)
HEMOGLOBIN: 10 g/dL — AB (ref 13.0–17.0)
MCH: 28.7 pg (ref 26.0–34.0)
MCHC: 33.4 g/dL (ref 30.0–36.0)
MCV: 85.7 fL (ref 78.0–100.0)
Platelets: 97 10*3/uL — ABNORMAL LOW (ref 150–400)
RBC: 3.49 MIL/uL — ABNORMAL LOW (ref 4.22–5.81)
RDW: 16.4 % — AB (ref 11.5–15.5)
WBC: 9.9 10*3/uL (ref 4.0–10.5)

## 2016-09-29 LAB — BASIC METABOLIC PANEL
ANION GAP: 8 (ref 5–15)
BUN: 20 mg/dL (ref 6–20)
CO2: 26 mmol/L (ref 22–32)
Calcium: 8.2 mg/dL — ABNORMAL LOW (ref 8.9–10.3)
Chloride: 105 mmol/L (ref 101–111)
Creatinine, Ser: 1.37 mg/dL — ABNORMAL HIGH (ref 0.61–1.24)
GFR calc Af Amer: 60 mL/min — ABNORMAL LOW (ref >60)
GFR calc non Af Amer: 51 mL/min — ABNORMAL LOW (ref >60)
GLUCOSE: 160 mg/dL — AB (ref 65–99)
Potassium: 3.9 mmol/L (ref 3.5–5.1)
Sodium: 139 mmol/L (ref 135–145)

## 2016-09-29 LAB — GLUCOSE, CAPILLARY: GLUCOSE-CAPILLARY: 130 mg/dL — AB (ref 65–99)

## 2016-09-29 LAB — MAGNESIUM: Magnesium: 1.6 mg/dL — ABNORMAL LOW (ref 1.7–2.4)

## 2016-09-29 MED ORDER — TORSEMIDE 20 MG PO TABS
80.0000 mg | ORAL_TABLET | Freq: Every day | ORAL | Status: DC
  Administered 2016-09-30 – 2016-10-02 (×3): 80 mg via ORAL
  Filled 2016-09-29 (×3): qty 4

## 2016-09-29 MED ORDER — SODIUM CHLORIDE 0.9% FLUSH: 3.0000 mL | INTRAVENOUS | Status: DC | PRN

## 2016-09-29 MED ORDER — SODIUM CHLORIDE 0.9 % IV SOLN: 250.0000 mL | INTRAVENOUS | Status: DC | PRN

## 2016-09-29 MED ORDER — SODIUM CHLORIDE 0.9% FLUSH
3.0000 mL | Freq: Two times a day (BID) | INTRAVENOUS | Status: DC
  Administered 2016-09-29 – 2016-10-01 (×6): 3 mL via INTRAVENOUS

## 2016-09-29 MED ORDER — LACTATED RINGERS IV BOLUS (SEPSIS): 1000.0000 mL | Freq: Three times a day (TID) | INTRAVENOUS | Status: AC | PRN

## 2016-09-29 MED ORDER — MAGNESIUM SULFATE 2 GM/50ML IV SOLN
2.0000 g | Freq: Once | INTRAVENOUS | Status: AC
  Administered 2016-09-29: 2 g via INTRAVENOUS
  Filled 2016-09-29: qty 50

## 2016-09-29 MED ORDER — TORSEMIDE 20 MG PO TABS
40.0000 mg | ORAL_TABLET | Freq: Every day | ORAL | Status: DC
  Administered 2016-09-30 – 2016-10-01 (×2): 40 mg via ORAL
  Filled 2016-09-29 (×3): qty 2

## 2016-09-29 MED ORDER — SODIUM CHLORIDE 0.9% FLUSH
3.0000 mL | Freq: Two times a day (BID) | INTRAVENOUS | Status: DC
  Administered 2016-09-29 (×2): 3 mL via INTRAVENOUS

## 2016-09-29 MED ORDER — POTASSIUM CHLORIDE CRYS ER 20 MEQ PO TBCR
20.0000 meq | EXTENDED_RELEASE_TABLET | Freq: Every day | ORAL | Status: DC
  Administered 2016-09-30 – 2016-10-02 (×3): 20 meq via ORAL
  Filled 2016-09-29 (×3): qty 1

## 2016-09-29 NOTE — Consult Note (Signed)
   [facility] CM Inpatient Consult   09/29/2016  JORDANNY WADDINGTON 1951-05-25 [id number]    Mr. Willden is active with [place] [facility] program. Martin Majestic to bedside to speak with him about ongoing [place] [facility] follow up. He remains agreeable and very appreciative of the assistance both he and his sister have received from [place] [facility] program.   Mr. Hammitt was the primary caregiver for his sister who is now at [facility] Mr. Bognar indicates his discharge plan is to go to SNF as he does not have any assistance at home. He states he prefers to go where his sister is residing- [facility].   Telephone call made to inpatient LCSW to make aware that [facility] is following. Made inpatient LCSW Roselyn Reef) aware of bedside conversation with Mr. Schulke and his desire to go to [facility] SNF at discharge.   Will continue to follow.    Marthenia Rolling, MSN-Ed, RN,BSN [facility] Liaison [phone]

## 2016-09-29 NOTE — Progress Notes (Signed)
[address] [place], [place] [address] Phone: [phone]  FAX: [phone]      Patrick [name] [id number] 1950-12-11  CARE TEAM:  PCP: Evette Doffing, MD  Outpatient Care Team: Patient Care Team: Sela Hua, MD as PCP - General (Family Medicine) Kyung Rudd, MD as Consulting Physician (Radiation Oncology) Truitt Merle, MD as Consulting Physician (Hematology) Minus Breeding, MD as Consulting Physician (Cardiology) Deloria Lair, NP as [place] Management Michael Boston, MD as Consulting Physician (General Surgery) Ardis Hughs, MD as Attending Physician (Urology)  Inpatient Treatment Team: Treatment Team: Attending Provider: Michael Boston, MD; Registered Nurse: Casimer Bilis, RN; Technician: Bearl Mulberry, NT; Registered Nurse: Alphonse Guild, RN   Problem List:   Principal Problem:   Adenocarcinoma of rectum s/p robotic APR/colostomy 09/28/2016 Active Problems:   BPH (benign prostatic hyperplasia)   Dyslipidemia   Obesity, unspecified   Chronic renal disease, stage III   Nonischemic cardiomyopathy ([place])   History of resection of rectum   Rectal cancer ([place])   1 Day Post-Op  09/28/2016  POST-OPERATIVE DIAGNOSIS:  Very low rectal cancer  Patient had ulcerated scar suspicious for some remaining tumor in the distal right anteriolateral rectum.  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 No obvious metastatic disease on visceral parietal peritoneum or liver.  PROCEDURE:   XI ROBOTIC ABDOMINOPERINEAL RESECTION  PERMANENT COLOSTOMY   SURGEON:  Adin Hector, MD  Assessment  Recovering well so far  Plan:  -transfer to floor  -adv diet -stop IVF (IVF bolus backup) -restart diuresis -Tx low Mag. -restart PO K w diuretics -HTN  control -ostomy care -Med +/- card consult PRN -f/u pathology -VTE prophylaxis- SCDs, etc -mobilize as tolerated to help recovery  35 minutes spent in review, evaluation, examination, counseling, and coordination of care.  More than 50% of that time was spent in counseling.  Adin Hector, M.D., F.A.C.S. Gastrointestinal and Minimally Invasive Surgery [facility] [address], [place], [place] [address] [phone] Main / Paging   09/29/2016    Subjective: (Chief complaint)  No events in SDU Sister aware of events/surgery (phone not working & cannot come to hospital) Minimal pain - tylenol working  Objective:  Vital signs:  Vitals:   09/29/16 0305 09/29/16 0400 09/29/16 0500 09/29/16 0600  BP:  (!) 121/54    Pulse:  64 62 65  Resp:  (!) 22 (!) 33 (!) 25  Temp: 97.4 F (36.3 C)     TempSrc: Axillary     SpO2:  96% 98% 98%  Weight:      Height:           Intake/Output   Yesterday:  05/02 0701 - 05/03 0700 In: 6564.5 [P.O.:750; I.V.:4937.5; Blood:277; IV VOHYWVPXT:062] Out: 2130 [Urine:1210; Drains:345; Stool:125; Blood:450] This shift:  No intake/output data recorded.  Bowel function:  Flatus: YES  BM:  YES  Drain: Serosanguinous   Physical Exam:  General: Pt awake/alert/oriented x4 in no acute distress Eyes: PERRL, normal EOM.  Sclera clear.  No icterus Neuro: CN II-XII intact w/o focal sensory/motor deficits. Lymph: No head/neck/groin lymphadenopathy Psych:  No delerium/psychosis/paranoia HENT: Normocephalic, Mucus membranes moist.  No thrush Neck: Supple, No tracheal deviation Chest: No chest wall pain w good excursion  CV:  Pulses intact.  Regular rhythm MS: Normal AROM mjr joints.  No obvious deformity  Abdomen: Soft.  Mildy distended.  Nontender.  No evidence of peritonitis.  No incarcerated hernias.  Colostomy pink with minimal edema  Ext:  No deformity.  No mjr edema.  No cyanosis Skin: No  petechiae / purpura  Results:   Labs: Results for orders placed or performed during the hospital encounter of 09/28/16 (from the past 48 hour(s))  I-STAT 7, (LYTES, BLD GAS, ICA, H+H)     Status: Abnormal   Collection Time: 09/28/16  1:24 PM  Result Value Ref Range   pH, Arterial 7.345 (L) 7.350 - 7.450   pCO2 arterial 52.3 (H) 32.0 - 48.0 mmHg   pO2, Arterial 130.0 (H) 83.0 - 108.0 mmHg   Bicarbonate 28.5 (H) 20.0 - 28.0 mmol/L   TCO2 30 0 - 100 mmol/L   O2 Saturation 99.0 %   Acid-Base Excess 2.0 0.0 - 2.0 mmol/L   Sodium 143 135 - 145 mmol/L   Potassium 4.0 3.5 - 5.1 mmol/L   Calcium, Ion 1.19 1.15 - 1.40 mmol/L   HCT 26.0 (L) 39.0 - 52.0 %   Hemoglobin 8.8 (L) 13.0 - 17.0 g/dL   Patient temperature HIDE    Sample type ARTERIAL   MRSA PCR Screening     Status: Abnormal   Collection Time: 09/28/16  4:06 PM  Result Value Ref Range   MRSA by PCR POSITIVE (A) NEGATIVE    Comment:        The GeneXpert MRSA Assay (FDA approved for NASAL specimens only), is one component of a comprehensive MRSA colonization surveillance program. It is not intended to diagnose MRSA infection nor to guide or monitor treatment for MRSA infections. RESULT CALLED TO, READ BACK BY AND VERIFIED WITH: S.ODOM RN AT 1931 ON 09/28/16 BY S.VANHOORNE   Glucose, capillary     Status: Abnormal   Collection Time: 09/28/16  7:36 PM  Result Value Ref Range   Glucose-Capillary 127 (H) 65 - 99 mg/dL   Comment 1 Notify RN    Comment 2 Document in Chart   Basic metabolic panel     Status: Abnormal   Collection Time: 09/29/16  3:49 AM  Result Value Ref Range   Sodium 139 135 - 145 mmol/L   Potassium 3.9 3.5 - 5.1 mmol/L   Chloride 105 101 - 111 mmol/L   CO2 26 22 - 32 mmol/L   Glucose, Bld 160 (H) 65 - 99 mg/dL   BUN 20 6 - 20 mg/dL   Creatinine, Ser 1.37 (H) 0.61 - 1.24 mg/dL   Calcium 8.2 (L) 8.9 - 10.3 mg/dL   GFR calc non Af Amer 51 (L) >60 mL/min   GFR calc Af Amer 60 (L) >60 mL/min    Comment:  (NOTE) The eGFR has been calculated using the CKD EPI equation. This calculation has not been validated in all clinical situations. eGFR's persistently <60 mL/min signify possible Chronic Kidney Disease.    Anion gap 8 5 - 15  CBC     Status: Abnormal   Collection Time: 09/29/16  3:49 AM  Result Value Ref Range   WBC 9.9 4.0 - 10.5 K/uL   RBC 3.49 (L) 4.22 - 5.81 MIL/uL   Hemoglobin 10.0 (L) 13.0 - 17.0 g/dL   HCT 29.9 (L) 39.0 - 52.0 %   MCV 85.7 78.0 - 100.0 fL   MCH 28.7 26.0 - 34.0 pg   MCHC 33.4 30.0 -  36.0 g/dL   RDW 16.4 (H) 11.5 - 15.5 %   Platelets 97 (L) 150 - 400 K/uL    Comment: SPECIMEN CHECKED FOR CLOTS REPEATED TO VERIFY PLATELET COUNT CONFIRMED BY SMEAR   Magnesium     Status: Abnormal   Collection Time: 09/29/16  3:49 AM  Result Value Ref Range   Magnesium 1.6 (L) 1.7 - 2.4 mg/dL    Imaging / Studies: No results found.  Medications / Allergies: per chart  Antibiotics: Anti-infectives    Start     Dose/Rate Route Frequency Ordered Stop   09/29/16 0400  cefoTEtan (CEFOTAN) 2 g in dextrose 5 % 50 mL IVPB     2 g 100 mL/hr over 30 Minutes Intravenous Every 12 hours 09/28/16 1714 09/29/16 0409   09/28/16 2200  cefoTEtan (CEFOTAN) 2 g in dextrose 5 % 50 mL IVPB  Status:  Discontinued     2 g 100 mL/hr over 30 Minutes Intravenous Every 12 hours 09/28/16 1730 09/28/16 1751   09/28/16 1731  neomycin (MYCIFRADIN) tablet 1,000 mg  Status:  Discontinued     1,000 mg Oral 3 times per day 09/28/16 1731 09/28/16 1825   09/28/16 1731  metroNIDAZOLE (FLAGYL) tablet 1,000 mg  Status:  Discontinued     1,000 mg Oral 3 times per day 09/28/16 1731 09/28/16 1826   09/28/16 1500  cefoTEtan in Dextrose 5% (CEFOTAN) IVPB 2 g     2 g Intravenous  Once 09/28/16 1449 09/28/16 1513   09/28/16 1300  clindamycin (CLEOCIN) 900 mg, gentamicin (GARAMYCIN) 240 mg in sodium chloride 0.9 % 1,000 mL for intraperitoneal lavage  Status:  Discontinued       As needed 09/28/16 1300 09/28/16  1412   09/28/16 0810  cefoTEtan in Dextrose 5% (CEFOTAN) 2-2.08 GM-% IVPB    Comments:  [place]   : cabinet override      09/28/16 0810 09/28/16 2014   09/28/16 0615  clindamycin (CLEOCIN) 900 mg, gentamicin (GARAMYCIN) 240 mg in sodium chloride 0.9 % 1,000 mL for intraperitoneal lavage  Status:  Discontinued      Intraperitoneal To Surgery 09/28/16 0614 09/28/16 1553   09/28/16 0614  cefoTEtan (CEFOTAN) 2 g in dextrose 5 % 50 mL IVPB     2 g 100 mL/hr over 30 Minutes Intravenous On call to O.R. 09/28/16 3790 09/28/16 1915        Note: Portions of this report may have been transcribed using voice recognition software. Every effort was made to ensure accuracy; however, inadvertent computerized transcription errors may be present.   Any transcriptional errors that result from this process are unintentional.     Adin Hector, M.D., F.A.C.S. Gastrointestinal and Minimally Invasive Surgery [facility] [address] [place], [place] [address] [phone] Main / Paging   09/29/2016

## 2016-09-29 NOTE — Care Management Note (Signed)
Case Management Note  Patient Details  Name: Patrick [name] MRN: [id number] Date of Birth: 1950/07/31  Subjective/Objective:   Invasive retcal surgery with formation of a permanent colostomy /a.line/iv fld/o2 via [place]                Action/Plan:  From home [place], BSN, [place], CCM/[phone] Chart review for patient progression. Chart review for case management needs: Next review due on [id number].  Expected Discharge Date:                  Expected Discharge Plan:  Home/Self Care  In-House Referral:     Discharge planning Services     Post Acute Care Choice:    Choice offered to:     DME Arranged:    DME Agency:     HH Arranged:    HH Agency:     Status of Service:  In process, will continue to follow  If discussed at Long Length of Stay Meetings, dates discussed:    Additional Comments:  Leeroy Cha, RN 09/29/2016, 9:07 AM

## 2016-09-29 NOTE — Anesthesia Postprocedure Evaluation (Signed)
Anesthesia Post Note  Patient: Patrick [name]  Procedure(s) Performed: Procedure(s) (LRB): XI ROBOT ABDOMINOPERINEAL RESECTION WITH PERMANENT COLOSTOMY ERAS PATHWAY (N/A)  Patient location during evaluation: PACU Anesthesia Type: General Level of consciousness: awake and alert Pain management: pain level controlled Vital Signs Assessment: post-procedure vital signs reviewed and stable Respiratory status: spontaneous breathing, nonlabored ventilation, respiratory function stable and patient connected to nasal cannula oxygen Cardiovascular status: blood pressure returned to baseline and stable Postop Assessment: no signs of nausea or vomiting Anesthetic complications: no       Last Vitals:  Vitals:   09/29/16 0600 09/29/16 0700  BP:    Pulse: 65 67  Resp: (!) 25 (!) 21  Temp:  36.7 C    Last Pain:  Vitals:   09/29/16 0700  TempSrc: Oral  PainSc:                  Sumedh Shinsato S

## 2016-09-29 NOTE — Consult Note (Signed)
[facility] Nurse ostomy consult note Stoma type/location: LUQ, end permanent colostomy Stomal assessment/size: aprox 2", edematous, budded, pink and moist Peristomal assessment: NA Treatment options for stomal/peristomal skin: NA Output liquid dark brown Ostomy pouching: 1pc.  Education provided:  Discussed role of the ostomy RN and the care we provide.  We discussed care of his stoma and he has some basic knowledge of products and care.  I have left educational materials in the room for the patient.  He is very engaged in Immunologist.  He lives with his sister and he is her primary CG, he will not have any other assistance at home.  I demonstrated emptying the pouch and closing lock and roll closure, demonstrated using toilet paper wick to clean the bottom of the spout. Discussed diet and change/empty regimen.   Enrolled patient in [place] program: No Supplies obtained and placed at the bedside 4 barrier rings, 4 pouches, and 4 skin barriers.  [place] Nurse team will follow along with you for continued support with ostomy teaching and care Kallyn Demarcus [facility], RN, [place], [place]

## 2016-09-30 LAB — MAGNESIUM: Magnesium: 2.5 mg/dL — ABNORMAL HIGH (ref 1.7–2.4)

## 2016-09-30 LAB — CBC
HEMATOCRIT: 29.2 % — AB (ref 39.0–52.0)
Hemoglobin: 9.8 g/dL — ABNORMAL LOW (ref 13.0–17.0)
MCH: 29.2 pg (ref 26.0–34.0)
MCHC: 33.6 g/dL (ref 30.0–36.0)
MCV: 86.9 fL (ref 78.0–100.0)
Platelets: 89 10*3/uL — ABNORMAL LOW (ref 150–400)
RBC: 3.36 MIL/uL — ABNORMAL LOW (ref 4.22–5.81)
RDW: 16.6 % — AB (ref 11.5–15.5)
WBC: 10.4 10*3/uL (ref 4.0–10.5)

## 2016-09-30 LAB — POTASSIUM: Potassium: 3.7 mmol/L (ref 3.5–5.1)

## 2016-09-30 LAB — CREATININE, SERUM
Creatinine, Ser: 1.16 mg/dL (ref 0.61–1.24)
GFR calc Af Amer: >60 mL/min (ref >60)
GFR calc non Af Amer: >60 mL/min (ref >60)

## 2016-09-30 NOTE — Clinical Social Work Placement (Addendum)
   CLINICAL SOCIAL WORK PLACEMENT  NOTE  Date:  09/30/2016  Patient Details  Name: Patrick [name] MRN: [id number] Date of Birth: Dec 17, 1950  Clinical Social Work is seeking post-discharge placement for this patient at the [place] level of care (*CSW will initial, date and re-position this form in  chart as items are completed):      Patient/family provided with [place] Work Department's list of facilities offering this level of care within the geographic area requested by the patient (or if unable, by the patient's family).  Yes   Patient/family informed of their freedom to choose among providers that offer the needed level of care, that participate in Medicare, Medicaid or managed care program needed by the patient, have an available bed and are willing to accept the patient.  Yes   Patient/family informed of [facility]'s ownership interest in [facility] and [facility], as well as of the fact that they are under no obligation to receive care at these facilities.  PASRR submitted to EDS on 09/30/16     PASRR number received on 09/30/16     Existing PASRR number confirmed on       FL2 transmitted to all facilities in geographic area requested by pt/family on 09/30/16     FL2 transmitted to all facilities within larger geographic area on    09/30/16.  Patient informed that his/her managed care company has contracts with or will negotiate with certain facilities, including the following:            Patient/family informed of bed offers received.  Patient chooses bed at   [facility].    Physician recommends and patient chooses bed at      Patient to be transferred to  [facility] on  10/02/16.  Patient to be transferred to facility by    [facility]   Patient family notified on   of transfer. Attempted to reach sister, unable to reach.  Name of family member notified:     Per pt, sister phone is not working and no other family to contact.    PHYSICIAN       Additional Comment:    _______________________________________________ Luretha Rued, LCSW  [phone] 09/30/2016, 1:15 PM

## 2016-09-30 NOTE — NC FL2 (Signed)
[place] LEVEL OF CARE SCREENING TOOL     IDENTIFICATION  Patient Name: Patrick [name] Birthdate: 25-Feb-1951 Sex: male Admission Date (Current Location): 09/28/2016  [facility] and [place] Number:  Herbalist and Address:  [facility],  [facility] [address] [place]      Provider Number: [id number]  Attending Physician Name and Address:  Michael Boston, MD  Relative Name and Phone Number:       Current Level of Care: Hospital Recommended Level of Care: [place] Prior Approval Number:    Date Approved/Denied:   PASRR Number: [id number]  Discharge Plan: SNF    Current Diagnoses: Patient Active Problem List   Diagnosis Date Noted  . Hypomagnesemia 09/29/2016  . History of resection of rectum 09/28/2016  . Rectal cancer ([place]) 09/28/2016  . Atrial fibrillation ([place]) 09/11/2016  . Coronary artery disease involving native coronary artery of native heart with angina pectoris ([place]) 09/11/2016  . Nonischemic cardiomyopathy ([place]) 09/11/2016  . Acute blood loss anemia 05/29/2016  . Thrombocytopenia ([place]) 05/29/2016  . Iron deficiency anemia due to chronic blood loss 04/15/2016  . Adenocarcinoma of rectum s/p robotic APR/colostomy 09/28/2016 04/14/2016  . Chronic kidney disease (CKD), stage III (moderate) 02/05/2016  . Abnormal finding on cardiovascular stress test 02/05/2016  . Cardiomyopathy, ischemic 06/17/2015  . Chronic systolic heart failure ([place]) 06/17/2015  . Major depressive disorder, recurrent episode ([place]) 04/17/2015  . Other iron deficiency anemias   . Health care maintenance 03/11/2014  . Obesity, unspecified 10/03/2013  . Dyslipidemia 08/13/2013  . CARDIAC MURMUR 01/26/2010  . MACULAR DEGENERATION 05/02/2008  . ERECTILE DYSFUNCTION 03/13/2007  . Essential hypertension 01/16/2007  . BPH (benign prostatic hyperplasia) 01/16/2007    Orientation RESPIRATION BLADDER Height & Weight     Self, Time, Situation,  Place  Normal Indwelling catheter Weight: 203 lb 14.8 oz (92.5 kg) Height:  5\' 9"  (175.3 cm)  BEHAVIORAL SYMPTOMS/MOOD NEUROLOGICAL BOWEL NUTRITION STATUS  Other (Comment) (no behaviors)   Colostomy Diet  AMBULATORY STATUS COMMUNICATION OF NEEDS Skin   Limited Assist Verbally Surgical wounds                       Personal Care Assistance Level of Assistance  Bathing, Feeding, Dressing Bathing Assistance: Limited assistance Feeding assistance: Independent Dressing Assistance: Limited assistance     Functional Limitations Info  Sight, Hearing, Speech Sight Info: Adequate Hearing Info: Adequate Speech Info: Adequate    SPECIAL CARE FACTORS FREQUENCY  PT (By licensed PT), OT (By licensed OT)     PT Frequency: 5x wk OT Frequency: 5x wk            Contractures Contractures Info: Not present    Additional Factors Info  Code Status, Isolation Precautions Code Status Info: Full Code       Isolation Precautions Info: 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 0.9 %  sodium chloride infusion  250 mL Intravenous PRN Michael Boston, MD      . acetaminophen (TYLENOL) tablet 1,000 mg  1,000 mg Oral TID Michael Boston, MD   1,000 mg at 09/30/16 1021  . alum & mag hydroxide-simeth (MAALOX/MYLANTA) 200-200-20 MG/5ML suspension 30 mL  30 mL Oral Q6H PRN Michael Boston, MD      . carvedilol (COREG) tablet 25 mg  25 mg Oral BID WC Michael Boston, MD   25 mg at 09/30/16 1021  . dexamethasone (DECADRON) injection 4 mg  4 mg Intravenous Q12H Michael Boston, MD   4 mg at 09/30/16 1023  . diphenhydrAMINE (BENADRYL) 12.5 MG/5ML elixir 12.5 mg  12.5 mg Oral Q6H PRN Michael Boston, MD       Or  . diphenhydrAMINE (BENADRYL) injection 12.5 mg  12.5 mg Intravenous Q6H PRN Michael Boston, MD      . enalaprilat (VASOTEC) injection  0.625-1.25 mg  0.625-1.25 mg Intravenous Q6H PRN Michael Boston, MD      . enoxaparin (LOVENOX) injection 40 mg  40 mg Subcutaneous Q24H Michael Boston, MD   40 mg at 09/30/16 1020  . HYDROmorphone (DILAUDID) injection 0.5-2 mg  0.5-2 mg Intravenous Q2H PRN Michael Boston, MD      . lactated ringers bolus 1,000 mL  1,000 mL Intravenous Q8H PRN Michael Boston, MD      . lactated ringers infusion 1,000 mL  1,000 mL Intravenous Q8H PRN Michael Boston, MD      . lip balm (CARMEX) ointment 1 application  1 application Topical BID Michael Boston, MD   1 application at 82/50/53 1023  . magic mouthwash  15 mL Oral QID PRN Michael Boston, MD      . menthol-cetylpyridinium (CEPACOL) lozenge 3 mg  1 lozenge Oral PRN Michael Boston, MD      . methocarbamol (ROBAXIN) 1,000 mg in dextrose 5 % 50 mL IVPB  1,000 mg Intravenous Q6H PRN Michael Boston, MD      . metoprolol (LOPRESSOR) injection 5 mg  5 mg Intravenous Q6H PRN Michael Boston, MD      . metoprolol tartrate (LOPRESSOR) tablet 12.5 mg  12.5 mg Oral Q12H PRN Michael Boston, MD      . mirtazapine (REMERON SOL-TAB) disintegrating tablet 15 mg  15 mg Oral QHS Michael Boston, MD   15 mg at 09/29/16 2234  . multivitamin with minerals tablet 1 tablet  1 tablet Oral Daily Michael Boston, MD   1 tablet at 09/30/16 1022  . nitroGLYCERIN (NITROSTAT) SL tablet 0.4 mg  0.4 mg Sublingual Q5 min PRN Michael Boston, MD      . ondansetron [facility]) tablet 4 mg  4 mg Oral Q6H PRN Michael Boston, MD       Or  . ondansetron [facility]) injection 4 mg  4 mg Intravenous Q6H PRN Michael Boston, MD      . oxyCODONE (Oxy IR/ROXICODONE) immediate release tablet 5-10 mg  5-10 mg Oral Q4H PRN Michael Boston, MD   10 mg at 09/29/16 1538  . phenol (CHLORASEPTIC) mouth spray 2 spray  2 spray Mouth/Throat PRN Michael Boston, MD      . potassium chloride SA (K-DUR,KLOR-CON) CR tablet 20 mEq  20 mEq Oral Daily Michael Boston, MD   20 mEq at 09/30/16 1022  . pravastatin (PRAVACHOL) tablet 20 mg  20 mg Oral q1800 Michael Boston,  MD   20 mg at 09/29/16 1845  . prochlorperazine (COMPAZINE) injection 5-10 mg  5-10 mg Intravenous Q4H PRN Michael Boston, MD      . saccharomyces boulardii (FLORASTOR) capsule 250 mg  250 mg Oral BID Michael Boston, MD   250 mg at 09/30/16 1021  . sodium chloride flush (NS) 0.9 % injection 3 mL  3 mL Intravenous Q12H Michael Boston, MD   3 mL at 09/30/16 1023  . sodium chloride flush (NS) 0.9 % injection 3 mL  3 mL  Intravenous PRN Michael Boston, MD      . tamsulosin [facility]) capsule 0.4 mg  0.4 mg Oral Daily Michael Boston, MD   0.4 mg at 09/30/16 1021  . torsemide (DEMADEX) tablet 40 mg  40 mg Oral q1800 Michael Boston, MD      . torsemide [facility]) tablet 80 mg  80 mg Oral Q0600 Michael Boston, MD   80 mg at 09/30/16 7793  . traZODone (DESYREL) tablet 300 mg  300 mg Oral QHS Michael Boston, MD   300 mg at 09/30/16 0001  . vitamin C (ASCORBIC ACID) tablet 2,000 mg  2,000 mg Oral BID Michael Boston, MD   2,000 mg at 09/30/16 1022     Discharge Medications: Please see discharge summary for a list of discharge medications.  Relevant Imaging Results:  Relevant Lab Results:   Additional Information SS # [id number]  Kimberley Dastrup, Randall An, LCSW

## 2016-09-30 NOTE — Progress Notes (Signed)
[address] [place], [place] [address] Phone: [phone]  FAX: [place] [phone] 1951/02/07  CARE TEAM:  PCP: Evette Doffing, MD  Outpatient Care Team: Patient Care Team: Sela Hua, MD as PCP - General (Family Medicine) Kyung Rudd, MD as Consulting Physician (Radiation Oncology) Truitt Merle, MD as Consulting Physician (Hematology) Minus Breeding, MD as Consulting Physician (Cardiology) Deloria Lair, NP as [place] Management Michael Boston, MD as Consulting Physician (General Surgery) Ardis Hughs, MD as Attending Physician (Urology)  Inpatient Treatment Team: Treatment Team: Attending Provider: Michael Boston, MD; Technician: Resa Miner, NT; Technician: Resa Miner Spaugh, NT   Problem List:   Principal Problem:   Adenocarcinoma of rectum s/p robotic APR/colostomy 09/28/2016 Active Problems:   Essential hypertension   BPH (benign prostatic hyperplasia)   Dyslipidemia   Obesity, unspecified   Cardiomyopathy, ischemic   Chronic kidney disease (CKD), stage III (moderate)   Iron deficiency anemia due to chronic blood loss   Nonischemic cardiomyopathy ([place])   History of resection of rectum   Rectal cancer ([place])   Hypomagnesemia   2 Days Post-Op  09/28/2016  POST-OPERATIVE DIAGNOSIS:  Very low rectal cancer  OR FINDINGS: Patient had ulcerated scar suspicious for some remaining tumor in the distal right anteriolateral rectum.  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 No obvious metastatic disease on visceral parietal peritoneum or liver.  PROCEDURE:    XI ROBOTIC ABDOMINOPERINEAL RESECTION  PERMANENT COLOSTOMY   SURGEON:  Adin Hector, MD  Assessment  Recovering well so  far  Plan:  -adv diet to solids -diuresis -restart PO K w diuretics -HTN control -ostomy care -Med +/- card consult PRN -f/u pathology -VTE prophylaxis- SCDs, etc -mobilize as tolerated to help recovery  D/C patient from hospital when patient meets criteria (anticipate in 1-2 day(s)):  Tolerating oral intake well Ambulating well Adequate pain control without IV medications Urinating  Having flatus Disposition planning in place - Patient has no help & concerned about self-care.  Will setup SNF for a few weeks w PT & colostomy care until until pt more independent   35 minutes spent in review, evaluation, examination, counseling, and coordination of care.  More than 50% of that time was spent in counseling.  Adin Hector, M.D., F.A.C.S. Gastrointestinal and Minimally Invasive Surgery [facility] [address] [place], [place] [address] [phone] Main / Paging   09/30/2016    Subjective: (Chief complaint)  No events on floor Tol pureed.  No nausea Minimal pain - tylenol working  Objective:  Vital signs:  Vitals:   09/29/16 1400 09/29/16 1600 09/29/16 1847 09/29/16 2216  BP: (!) 131/52 (!) 130/50 (!) 143/69 (!) 109/55  Pulse: 70 70 74 63  Resp: (!) 24 (!) '24 16 16  ' Temp:  98.5 F (36.9 C) 97.5 F (36.4 C) 98.1 F (36.7 C)  TempSrc:  Oral Axillary Oral  SpO2: 94% 95% 97% 96%  Weight:      Height:        Last BM Date: 09/29/16  Intake/Output   Yesterday:  05/03 0701 - 05/04 0700 In: 1010 [P.O.:1010] Out: 1250 [Urine:1000; Drains:110; Stool:140] This shift:  Total I/O In: 120 [P.O.:120] Out: 650 [Urine:500;  Drains:50; Stool:100]  Bowel function:  Flatus: YES  BM:  YES  Drain: Serosanguinous   Physical Exam:  General: Pt awake/alert/oriented x4 in no acute distress Eyes: PERRL, normal EOM.  Sclera clear.  No icterus Neuro: CN II-XII intact w/o focal sensory/motor deficits. Lymph: No head/neck/groin  lymphadenopathy Psych:  No delerium/psychosis/paranoia HENT: Normocephalic, Mucus membranes moist.  No thrush Neck: Supple, No tracheal deviation Chest: No chest wall pain w good excursion CV:  Pulses intact.  Regular rhythm MS: Normal AROM mjr joints.  No obvious deformity  Abdomen: Soft. Obese  Nondistended.  Nontender.  No evidence of peritonitis.  No incarcerated hernias.  Colostomy pink with minimal edema +gas/stool  Ext:  No deformity.  No mjr edema.  No cyanosis Skin: No petechiae / purpura  Results:   Labs: Results for orders placed or performed during the hospital encounter of 09/28/16 (from the past 48 hour(s))  I-STAT 7, (LYTES, BLD GAS, ICA, H+H)     Status: Abnormal   Collection Time: 09/28/16  1:24 PM  Result Value Ref Range   pH, Arterial 7.345 (L) 7.350 - 7.450   pCO2 arterial 52.3 (H) 32.0 - 48.0 mmHg   pO2, Arterial 130.0 (H) 83.0 - 108.0 mmHg   Bicarbonate 28.5 (H) 20.0 - 28.0 mmol/L   TCO2 30 0 - 100 mmol/L   O2 Saturation 99.0 %   Acid-Base Excess 2.0 0.0 - 2.0 mmol/L   Sodium 143 135 - 145 mmol/L   Potassium 4.0 3.5 - 5.1 mmol/L   Calcium, Ion 1.19 1.15 - 1.40 mmol/L   HCT 26.0 (L) 39.0 - 52.0 %   Hemoglobin 8.8 (L) 13.0 - 17.0 g/dL   Patient temperature HIDE    Sample type ARTERIAL   MRSA PCR Screening     Status: Abnormal   Collection Time: 09/28/16  4:06 PM  Result Value Ref Range   MRSA by PCR POSITIVE (A) NEGATIVE    Comment:        The GeneXpert MRSA Assay (FDA approved for NASAL specimens only), is one component of a comprehensive MRSA colonization surveillance program. It is not intended to diagnose MRSA infection nor to guide or monitor treatment for MRSA infections. RESULT CALLED TO, READ BACK BY AND VERIFIED WITH: S.ODOM RN AT 1931 ON 09/28/16 BY S.VANHOORNE   Glucose, capillary     Status: Abnormal   Collection Time: 09/28/16  7:36 PM  Result Value Ref Range   Glucose-Capillary 127 (H) 65 - 99 mg/dL   Comment 1 Notify RN     Comment 2 Document in Chart   Basic metabolic panel     Status: Abnormal   Collection Time: 09/29/16  3:49 AM  Result Value Ref Range   Sodium 139 135 - 145 mmol/L   Potassium 3.9 3.5 - 5.1 mmol/L   Chloride 105 101 - 111 mmol/L   CO2 26 22 - 32 mmol/L   Glucose, Bld 160 (H) 65 - 99 mg/dL   BUN 20 6 - 20 mg/dL   Creatinine, Ser 1.37 (H) 0.61 - 1.24 mg/dL   Calcium 8.2 (L) 8.9 - 10.3 mg/dL   GFR calc non Af Amer 51 (L) >60 mL/min   GFR calc Af Amer 60 (L) >60 mL/min    Comment: (NOTE) The eGFR has been calculated using the CKD EPI equation. This calculation has not been validated in all clinical situations. eGFR's persistently <60 mL/min signify possible Chronic Kidney Disease.    Anion gap 8 5 - 15  CBC  Status: Abnormal   Collection Time: 09/29/16  3:49 AM  Result Value Ref Range   WBC 9.9 4.0 - 10.5 K/uL   RBC 3.49 (L) 4.22 - 5.81 MIL/uL   Hemoglobin 10.0 (L) 13.0 - 17.0 g/dL   HCT 29.9 (L) 39.0 - 52.0 %   MCV 85.7 78.0 - 100.0 fL   MCH 28.7 26.0 - 34.0 pg   MCHC 33.4 30.0 - 36.0 g/dL   RDW 16.4 (H) 11.5 - 15.5 %   Platelets 97 (L) 150 - 400 K/uL    Comment: SPECIMEN CHECKED FOR CLOTS REPEATED TO VERIFY PLATELET COUNT CONFIRMED BY SMEAR   Magnesium     Status: Abnormal   Collection Time: 09/29/16  3:49 AM  Result Value Ref Range   Magnesium 1.6 (L) 1.7 - 2.4 mg/dL  Glucose, capillary     Status: Abnormal   Collection Time: 09/29/16  9:24 AM  Result Value Ref Range   Glucose-Capillary 130 (H) 65 - 99 mg/dL    Imaging / Studies: No results found.  Medications / Allergies: per chart  Antibiotics: Anti-infectives    Start     Dose/Rate Route Frequency Ordered Stop   09/29/16 0400  cefoTEtan (CEFOTAN) 2 g in dextrose 5 % 50 mL IVPB     2 g 100 mL/hr over 30 Minutes Intravenous Every 12 hours 09/28/16 1714 09/29/16 0409   09/28/16 2200  cefoTEtan (CEFOTAN) 2 g in dextrose 5 % 50 mL IVPB  Status:  Discontinued     2 g 100 mL/hr over 30 Minutes Intravenous  Every 12 hours 09/28/16 1730 09/28/16 1751   09/28/16 1731  neomycin (MYCIFRADIN) tablet 1,000 mg  Status:  Discontinued     1,000 mg Oral 3 times per day 09/28/16 1731 09/28/16 1825   09/28/16 1731  metroNIDAZOLE (FLAGYL) tablet 1,000 mg  Status:  Discontinued     1,000 mg Oral 3 times per day 09/28/16 1731 09/28/16 1826   09/28/16 1500  cefoTEtan in Dextrose 5% (CEFOTAN) IVPB 2 g     2 g Intravenous  Once 09/28/16 1449 09/28/16 1513   09/28/16 1300  clindamycin (CLEOCIN) 900 mg, gentamicin (GARAMYCIN) 240 mg in sodium chloride 0.9 % 1,000 mL for intraperitoneal lavage  Status:  Discontinued       As needed 09/28/16 1300 09/28/16 1412   09/28/16 0810  cefoTEtan in Dextrose 5% (CEFOTAN) 2-2.08 GM-% IVPB    Comments:  [place]   : cabinet override      09/28/16 0810 09/28/16 2014   09/28/16 0615  clindamycin (CLEOCIN) 900 mg, gentamicin (GARAMYCIN) 240 mg in sodium chloride 0.9 % 1,000 mL for intraperitoneal lavage  Status:  Discontinued      Intraperitoneal To Surgery 09/28/16 0614 09/28/16 1553   09/28/16 0614  cefoTEtan (CEFOTAN) 2 g in dextrose 5 % 50 mL IVPB     2 g 100 mL/hr over 30 Minutes Intravenous On call to O.R. 09/28/16 6553 09/28/16 1915        Note: Portions of this report may have been transcribed using voice recognition software. Every effort was made to ensure accuracy; however, inadvertent computerized transcription errors may be present.   Any transcriptional errors that result from this process are unintentional.     Adin Hector, M.D., F.A.C.S. Gastrointestinal and Minimally Invasive Surgery [facility] [address] [place], [place] [address] [phone] Main / Paging   09/30/2016

## 2016-09-30 NOTE — Discharge Summary (Signed)
Physician Discharge Summary  Patient ID: Patrick [name] MRN: [id number] DOB/AGE: [birth year]  66 y.o.  Admit date: 09/28/2016 Discharge date: 10/02/2016  Patient Care Team: Sela Hua, MD as PCP - General (Family Medicine) Kyung Rudd, MD as Consulting Physician (Radiation Oncology) Truitt Merle, MD as Consulting Physician (Hematology) Minus Breeding, MD as Consulting Physician (Cardiology) Deloria Lair, NP as [place] Management Michael Boston, MD as Consulting Physician (General Surgery) Ardis Hughs, MD as Attending Physician (Urology)  Discharge Diagnoses:  Principal Problem:   Adenocarcinoma of rectum s/p robotic APR/colostomy 09/28/2016 Active Problems:   Essential hypertension   BPH (benign prostatic hyperplasia)   Dyslipidemia   Obesity, unspecified   Cardiomyopathy, ischemic   Chronic kidney disease (CKD), stage III (moderate)   Iron deficiency anemia due to chronic blood loss   Nonischemic cardiomyopathy ([place])   History of resection of rectum   Rectal cancer ([place])   Hypomagnesemia   POST-OPERATIVE DIAGNOSIS:   Very low rectal cancer  SURGERY:  09/28/2016  Procedure(s): XI ROBOT ABDOMINOPERINEAL RESECTION WITH PERMANENT COLOSTOMY ERAS PATHWAY  SURGEON:    Surgeon(s): Michael Boston, MD Mickeal Skinner, MD  Consults: None  Hospital Course:   The patient underwent the surgery above.  Postoperatively, the patient gradually mobilized and advanced to a solid diet.  Pain and other symptoms were treated aggressively.    By the time of discharge, the patient was walking well the hallways, eating food, having flatus.  Pain was well-controlled on an oral medications.  Based on meeting discharge criteria and continuing to recover, I felt it was safe for the patient to be discharged from the hospital to further recover with close followup. Postoperative recommendations were discussed in detail.  They are written as well.  Discharged Condition:  good  Disposition:  Follow-up Information    GROSS,STEVEN C., MD. Schedule an appointment as soon as possible for a visit in 2 weeks.   Specialty:  General Surgery Why:  To follow up after your operation, To follow up after your hospital stay Contact information: [place] [address] [phone]           01-Home or Self Care  Discharge Instructions    Call MD for:    Complete by:  As directed    FEVER > 101.5 F  (temperatures < 101.5 F are not significant)   Call MD for:  extreme fatigue    Complete by:  As directed    Call MD for:  persistant dizziness or light-headedness    Complete by:  As directed    Call MD for:  persistant nausea and vomiting    Complete by:  As directed    Call MD for:  redness, tenderness, or signs of infection (pain, swelling, redness, odor or green/yellow discharge around incision site)    Complete by:  As directed    Call MD for:  severe uncontrolled pain    Complete by:  As directed    Diet - low sodium heart healthy    Complete by:  As directed    Follow a light diet the first few days at home.   Start with a bland diet such as soups, liquids, starchy foods, low fat foods, etc.   If you feel full, bloated, or constipated, stay on a full liquid or pureed/blenderized diet for a few days until you feel better and no longer constipated. Be sure to drink plenty of fluids every day to avoid  getting dehydrated (feeling dizzy, not urinating, etc.). Gradually add a fiber supplement to your diet   Discharge instructions    Complete by:  As directed    See Discharge Instructions If you are not getting better after two weeks or are noticing you are getting worse, contact our office [phone] for further advice.  We may need to adjust your medications, re-evaluate you in the office, send you to the emergency room, or see what other things we can do to help. The clinic staff is available to answer your questions during  regular business hours (8:30am-5pm).  Please Brylon't hesitate to call and ask to speak to one of our nurses for clinical concerns.    A surgeon from [facility] is always on call at the hospitals 24 hours/day If you have a medical emergency, go to the nearest emergency room or call 911.   Driving Restrictions    Complete by:  As directed    You may drive when you are no longer taking narcotic prescription pain medication, you can comfortably wear a seatbelt, and you can safely make sudden turns/stops to protect yourself without hesitating due to pain.   Increase activity slowly    Complete by:  As directed    Start light daily activities --- self-care, walking, climbing stairs- beginning the day after surgery.  Gradually increase activities as tolerated.  Control your pain to be active.  Stop when you are tired.  Ideally, walk several times a day, eventually an hour a day.   Most people are back to most day-to-day activities in a few weeks.  It takes 4-8 weeks to get back to unrestricted, intense activity. If you can walk 30 minutes without difficulty, it is safe to try more intense activity such as jogging, treadmill, bicycling, low-impact aerobics, swimming, etc. Save the most intensive and strenuous activity for last (Usually 4-8 weeks after surgery) such as sit-ups, heavy lifting, contact sports, etc.  Refrain from any intense heavy lifting or straining until you are off narcotics for pain control.  You will have off days, but things should improve week-by-week. DO NOT PUSH THROUGH PAIN.  Let pain be your guide: If it hurts to do something, Muhamed't do it.  Pain is your body warning you to avoid that activity for another week until the pain goes down.   Lifting restrictions    Complete by:  As directed    If you can walk 30 minutes without difficulty, it is safe to try more intense activity such as jogging, treadmill, bicycling, low-impact aerobics, swimming, etc. Save the most intensive  and strenuous activity for last (Usually 4-8 weeks after surgery) such as sit-ups, heavy lifting, contact sports, etc.  Refrain from any intense heavy lifting or straining until you are off narcotics for pain control.  You will have off days, but things should improve week-by-week. DO NOT PUSH THROUGH PAIN.  Let pain be your guide: If it hurts to do something, Daveon't do it.  Pain is your body warning you to avoid that activity for another week until the pain goes down.   May walk up steps    Complete by:  As directed    No wound care    Complete by:  As directed    It is good for closed incision and even open wounds to be washed every day.  Shower every day.  Short baths are fine.  Wash the incisions and wounds clean with soap & water.  If you have a closed incision(s), wash the incision with soap & water every day.  You may leave closed incisions open to air if it is dry.   You may cover the incision with clean gauze & replace it after your daily shower for comfort. If you have skin tapes (Steristrips) or skin glue (Dermabond) on your incision, leave them in place.  They will fall off on their own like a scab.  You may trim any edges that curl up with clean scissors.  If you have staples, set up an appointment for them to be removed in the office in 10 days after surgery.  If you have a drain, wash around the skin exit site with soap & water and place a new dressing of gauze or band aid around the skin every day.  Keep the drain site clean & dry.   Sexual Activity Restrictions    Complete by:  As directed    You may have sexual intercourse when it is comfortable. If it hurts to do something, stop.      Allergies as of 09/30/2016   Not on File     Medication List    TAKE these medications   acetaminophen 500 MG tablet Commonly known as:  TYLENOL Take 1,000 mg by mouth every 6 (six) hours as needed (for pain/fever/headaches.).   carvedilol 25 MG tablet Commonly known as:  COREG Take 1  tablet (25 mg total) by mouth 2 (two) times daily.   ferrous sulfate 325 (65 FE) MG tablet take 1 tablet by mouth once daily WITH BREAKFAST   finasteride 5 MG tablet Commonly known as:  PROSCAR take 1 tablet by mouth once daily   losartan 50 MG tablet Commonly known as:  COZAAR Take 50 mg by mouth at bedtime.   lovastatin 20 MG tablet Commonly known as:  MEVACOR take 1 tablet by mouth at bedtime   methocarbamol 750 MG tablet Commonly known as:  ROBAXIN Take 1 tablet (750 mg total) by mouth 4 (four) times daily as needed (use for muscle cramps/pain).   metolazone 2.5 MG tablet Commonly known as:  ZAROXOLYN Take 1 tablet (2.5 mg total) by mouth daily as needed (WEIGHT GAIN 3LBS OR MORE IN 24 HOURS). Weight gain   mirtazapine 15 MG disintegrating tablet Commonly known as:  REMERON SOL-TAB place 1 tablet ON TONGUE at bedtime   multivitamin with minerals tablet Take 1 tablet by mouth daily. Men's 50+ Multivitamin   nitroGLYCERIN 0.4 MG SL tablet Commonly known as:  NITROSTAT Place 1 tablet (0.4 mg total) under the tongue every 5 (five) minutes as needed for chest pain.   omega-3 acid ethyl esters 1 g capsule Commonly known as:  LOVAZA take 2 capsules by mouth twice a day   oxyCODONE 5 MG immediate release tablet Commonly known as:  Oxy IR/ROXICODONE Take 1-2 tablets (5-10 mg total) by mouth every 6 (six) hours as needed for moderate pain, severe pain or breakthrough pain.   oxyCODONE-acetaminophen 5-325 MG tablet Commonly known as:  PERCOCET/ROXICET Take 1 tablet by mouth every 8 (eight) hours as needed for severe pain.   potassium chloride SA 20 MEQ tablet Commonly known as:  K-DUR,KLOR-CON Take 3 tablets (60 mEq total) by mouth daily. TAKE AN  2 EXTRA TABLETS WHEN TAKING METOLAZONE 2.5 MG AS NEEDED What changed:  how much to take  additional instructions   tamsulosin 0.4 MG Caps capsule Commonly known as:  FLOMAX Take 0.4 mg by mouth daily.  torsemide 20 MG  tablet Commonly known as:  DEMADEX Take 40-80 mg by mouth 2 (two) times daily. 80 mg in the morning  & 40 mg at night   traZODone 100 MG tablet Commonly known as:  DESYREL Take 300 mg by mouth at bedtime.   vitamin C 1000 MG tablet Take 2,000 mg by mouth 2 (two) times daily.       Significant Diagnostic Studies:  Results for orders placed or performed during the hospital encounter of 09/28/16 (from the past 72 hour(s))  I-STAT 7, (LYTES, BLD GAS, ICA, H+H)     Status: Abnormal   Collection Time: 09/28/16  1:24 PM  Result Value Ref Range   pH, Arterial 7.345 (L) 7.350 - 7.450   pCO2 arterial 52.3 (H) 32.0 - 48.0 mmHg   pO2, Arterial 130.0 (H) 83.0 - 108.0 mmHg   Bicarbonate 28.5 (H) 20.0 - 28.0 mmol/L   TCO2 30 0 - 100 mmol/L   O2 Saturation 99.0 %   Acid-Base Excess 2.0 0.0 - 2.0 mmol/L   Sodium 143 135 - 145 mmol/L   Potassium 4.0 3.5 - 5.1 mmol/L   Calcium, Ion 1.19 1.15 - 1.40 mmol/L   HCT 26.0 (L) 39.0 - 52.0 %   Hemoglobin 8.8 (L) 13.0 - 17.0 g/dL   Patient temperature HIDE    Sample type ARTERIAL   MRSA PCR Screening     Status: Abnormal   Collection Time: 09/28/16  4:06 PM  Result Value Ref Range   MRSA by PCR POSITIVE (A) NEGATIVE    Comment:        The GeneXpert MRSA Assay (FDA approved for NASAL specimens only), is one component of a comprehensive MRSA colonization surveillance program. It is not intended to diagnose MRSA infection nor to guide or monitor treatment for MRSA infections. RESULT CALLED TO, READ BACK BY AND VERIFIED WITH: S.ODOM RN AT 1931 ON 09/28/16 BY S.VANHOORNE   Glucose, capillary     Status: Abnormal   Collection Time: 09/28/16  7:36 PM  Result Value Ref Range   Glucose-Capillary 127 (H) 65 - 99 mg/dL   Comment 1 Notify RN    Comment 2 Document in Chart   Basic metabolic panel     Status: Abnormal   Collection Time: 09/29/16  3:49 AM  Result Value Ref Range   Sodium 139 135 - 145 mmol/L   Potassium 3.9 3.5 - 5.1 mmol/L    Chloride 105 101 - 111 mmol/L   CO2 26 22 - 32 mmol/L   Glucose, Bld 160 (H) 65 - 99 mg/dL   BUN 20 6 - 20 mg/dL   Creatinine, Ser 1.37 (H) 0.61 - 1.24 mg/dL   Calcium 8.2 (L) 8.9 - 10.3 mg/dL   GFR calc non Af Amer 51 (L) >60 mL/min   GFR calc Af Amer 60 (L) >60 mL/min    Comment: (NOTE) The eGFR has been calculated using the CKD EPI equation. This calculation has not been validated in all clinical situations. eGFR's persistently <60 mL/min signify possible Chronic Kidney Disease.    Anion gap 8 5 - 15  CBC     Status: Abnormal   Collection Time: 09/29/16  3:49 AM  Result Value Ref Range   WBC 9.9 4.0 - 10.5 K/uL   RBC 3.49 (L) 4.22 - 5.81 MIL/uL   Hemoglobin 10.0 (L) 13.0 - 17.0 g/dL   HCT 29.9 (L) 39.0 - 52.0 %   MCV 85.7 78.0 - 100.0 fL  MCH 28.7 26.0 - 34.0 pg   MCHC 33.4 30.0 - 36.0 g/dL   RDW 16.4 (H) 11.5 - 15.5 %   Platelets 97 (L) 150 - 400 K/uL    Comment: SPECIMEN CHECKED FOR CLOTS REPEATED TO VERIFY PLATELET COUNT CONFIRMED BY SMEAR   Magnesium     Status: Abnormal   Collection Time: 09/29/16  3:49 AM  Result Value Ref Range   Magnesium 1.6 (L) 1.7 - 2.4 mg/dL  Glucose, capillary     Status: Abnormal   Collection Time: 09/29/16  9:24 AM  Result Value Ref Range   Glucose-Capillary 130 (H) 65 - 99 mg/dL  CBC     Status: Abnormal   Collection Time: 09/30/16  5:56 AM  Result Value Ref Range   WBC 10.4 4.0 - 10.5 K/uL   RBC 3.36 (L) 4.22 - 5.81 MIL/uL   Hemoglobin 9.8 (L) 13.0 - 17.0 g/dL   HCT 29.2 (L) 39.0 - 52.0 %   MCV 86.9 78.0 - 100.0 fL   MCH 29.2 26.0 - 34.0 pg   MCHC 33.6 30.0 - 36.0 g/dL   RDW 16.6 (H) 11.5 - 15.5 %   Platelets 89 (L) 150 - 400 K/uL    Comment: CONSISTENT WITH PREVIOUS RESULT  Potassium     Status: None   Collection Time: 09/30/16  5:56 AM  Result Value Ref Range   Potassium 3.7 3.5 - 5.1 mmol/L  Creatinine, serum     Status: None   Collection Time: 09/30/16  5:56 AM  Result Value Ref Range   Creatinine, Ser 1.16 0.61 -  1.24 mg/dL   GFR calc non Af Amer >60 >60 mL/min   GFR calc Af Amer >60 >60 mL/min    Comment: (NOTE) The eGFR has been calculated using the CKD EPI equation. This calculation has not been validated in all clinical situations. eGFR's persistently <60 mL/min signify possible Chronic Kidney Disease.   Magnesium     Status: Abnormal   Collection Time: 09/30/16  5:56 AM  Result Value Ref Range   Magnesium 2.5 (H) 1.7 - 2.4 mg/dL    No results found.  Discharge Exam: Blood pressure 131/70, pulse 71, temperature 97.8 F (36.6 C), temperature source Oral, resp. rate 16, height '5\' 9"'  (1.753 m), weight 92.5 kg (203 lb 14.8 oz), SpO2 96 %.  General: Pt awake/alert/oriented x4 in No acute distress Eyes: PERRL, normal EOM.  Sclera clear.  No icterus Neuro: CN II-XII intact w/o focal sensory/motor deficits. Lymph: No head/neck/groin lymphadenopathy Psych:  No delerium/psychosis/paranoia HENT: Normocephalic, Mucus membranes moist.  No thrush Neck: Supple, No tracheal deviation Chest: No chest wall pain w good excursion CV:  Pulses intact.  Regular rhythm MS: Normal AROM mjr joints.  No obvious deformity Abdomen: Soft.  Nondistended.  Mildly tender at incisions only.  No evidence of peritonitis.  No incarcerated hernias. Ext:  SCDs BLE.  No mjr edema.  No cyanosis Skin: No petechiae / purpura  Past Medical History:  Diagnosis Date  . Arthritis    "right hand; right knee" (06/19/2014)  . CAD (coronary artery disease)    2v CAD with subtotal occlusion of small co-dominant RCA and borderline lesion in moderate-sized OM-1  . CHF (congestive heart failure) (HCC)    Preserved EF  . Degenerative joint disease of knee, right   . Dysrhythmia   . Enlarged prostate   . Hyperlipidemia   . Hypertension   . Pneumonia 05/2014  . Prediabetes 10/03/2014  . Scrotal edema  04/03/2015  . SMALL BOWEL OBSTRUCTION, HX OF 08/21/2007   Annotation: with narrowing in the ileocecal region Qualifier: Diagnosis of   By: Hassell Done FNP, Tori Milks      Past Surgical History:  Procedure Laterality Date  . CARDIAC CATHETERIZATION N/A 02/16/2016   Procedure: Right/Left Heart Cath and Coronary Angiography;  Surgeon: Jolaine Artist, MD;  Location: [facility];  Service: Cardiovascular;  Laterality: N/A;  . COLONOSCOPY N/A 04/01/2016   Procedure: COLONOSCOPY;  Surgeon: Leighton Ruff, MD;  Location: [facility];  Service: Endoscopy;  Laterality: N/A;  . INGUINAL HERNIA REPAIR Left 1990's  . IR GENERIC HISTORICAL  07/05/2016   IR RADIOLOGIST EVAL & MGMT 07/05/2016 Corrie Mckusick, DO [facility] RAD  . XI ROBOT ABDOMINAL PERINEAL RESECTION N/A 09/28/2016   Procedure: XI ROBOT ABDOMINOPERINEAL RESECTION WITH PERMANENT COLOSTOMY ERAS PATHWAY;  Surgeon: Michael Boston, MD;  Location: [facility];  Service: General;  Laterality: N/A;    Social History   Social History  . Marital status: Single    Spouse name: N/A  . Number of children: N/A  . Years of education: N/A   Occupational History  . Not on file.   Social History Main Topics  . Smoking status: Former Smoker    Years: 5.00    Types: Pipe, Landscape architect  . Smokeless tobacco: Never Used     Comment: 06/19/2014 "stopped smoking in ~ 2014; used to smoke a pipe or cigar a couple times/month"  . Alcohol use No  . Drug use: No  . Sexual activity: No   Other Topics Concern  . Not on file   Social History Narrative   Single, lives w/his sister of whom he is caregiver       Family History  Problem Relation Age of Onset  . Hypertension Mother   . Diabetes Mother   . Stroke Mother   . Cancer Father 8    Prostate  . Dementia Father   . COPD Sister   . Arthritis Sister   . Edema Sister     Current Facility-Administered Medications  Medication Dose Route Frequency Provider Last Rate Last Dose  . 0.9 %  sodium chloride infusion  250 mL Intravenous PRN Michael Boston, MD      . acetaminophen (TYLENOL) tablet 1,000 mg  1,000 mg Oral TID Michael Boston, MD    1,000 mg at 09/29/16 2235  . alum & mag hydroxide-simeth (MAALOX/MYLANTA) 200-200-20 MG/5ML suspension 30 mL  30 mL Oral Q6H PRN Michael Boston, MD      . alvimopan (ENTEREG) capsule 12 mg  12 mg Oral BID Michael Boston, MD   12 mg at 09/29/16 2235  . carvedilol (COREG) tablet 25 mg  25 mg Oral BID WC Michael Boston, MD   25 mg at 09/29/16 1845  . dexamethasone (DECADRON) injection 4 mg  4 mg Intravenous Q12H Michael Boston, MD   4 mg at 09/29/16 2235  . diphenhydrAMINE (BENADRYL) 12.5 MG/5ML elixir 12.5 mg  12.5 mg Oral Q6H PRN Michael Boston, MD       Or  . diphenhydrAMINE (BENADRYL) injection 12.5 mg  12.5 mg Intravenous Q6H PRN Michael Boston, MD      . enalaprilat (VASOTEC) injection 0.625-1.25 mg  0.625-1.25 mg Intravenous Q6H PRN Michael Boston, MD      . enoxaparin (LOVENOX) injection 40 mg  40 mg Subcutaneous Q24H Michael Boston, MD   40 mg at 09/29/16 0900  . HYDROmorphone (DILAUDID) injection 0.5-2 mg  0.5-2 mg Intravenous Q2H  PRN Michael Boston, MD      . lactated ringers bolus 1,000 mL  1,000 mL Intravenous Q8H PRN Michael Boston, MD      . lactated ringers infusion 1,000 mL  1,000 mL Intravenous Q8H PRN Michael Boston, MD      . lip balm (CARMEX) ointment 1 application  1 application Topical BID Michael Boston, MD   1 application at 02/40/97 2234  . magic mouthwash  15 mL Oral QID PRN Michael Boston, MD      . menthol-cetylpyridinium (CEPACOL) lozenge 3 mg  1 lozenge Oral PRN Michael Boston, MD      . methocarbamol (ROBAXIN) 1,000 mg in dextrose 5 % 50 mL IVPB  1,000 mg Intravenous Q6H PRN Michael Boston, MD      . metoprolol (LOPRESSOR) injection 5 mg  5 mg Intravenous Q6H PRN Michael Boston, MD      . metoprolol tartrate (LOPRESSOR) tablet 12.5 mg  12.5 mg Oral Q12H PRN Michael Boston, MD      . mirtazapine (REMERON SOL-TAB) disintegrating tablet 15 mg  15 mg Oral QHS Michael Boston, MD   15 mg at 09/29/16 2234  . multivitamin with minerals tablet 1 tablet  1 tablet Oral Daily Michael Boston, MD   1 tablet at 09/29/16  0924  . nitroGLYCERIN (NITROSTAT) SL tablet 0.4 mg  0.4 mg Sublingual Q5 min PRN Michael Boston, MD      . ondansetron [facility]) tablet 4 mg  4 mg Oral Q6H PRN Michael Boston, MD       Or  . ondansetron [facility]) injection 4 mg  4 mg Intravenous Q6H PRN Michael Boston, MD      . oxyCODONE (Oxy IR/ROXICODONE) immediate release tablet 5-10 mg  5-10 mg Oral Q4H PRN Michael Boston, MD   10 mg at 09/29/16 1538  . phenol (CHLORASEPTIC) mouth spray 2 spray  2 spray Mouth/Throat PRN Michael Boston, MD      . potassium chloride SA (K-DUR,KLOR-CON) CR tablet 20 mEq  20 mEq Oral Daily Michael Boston, MD      . pravastatin (PRAVACHOL) tablet 20 mg  20 mg Oral q1800 Michael Boston, MD   20 mg at 09/29/16 1845  . prochlorperazine (COMPAZINE) injection 5-10 mg  5-10 mg Intravenous Q4H PRN Michael Boston, MD      . saccharomyces boulardii (FLORASTOR) capsule 250 mg  250 mg Oral BID Michael Boston, MD   250 mg at 09/29/16 2235  . sodium chloride flush (NS) 0.9 % injection 3 mL  3 mL Intravenous Q12H Michael Boston, MD   3 mL at 09/29/16 2236  . sodium chloride flush (NS) 0.9 % injection 3 mL  3 mL Intravenous PRN Michael Boston, MD      . tamsulosin [facility]) capsule 0.4 mg  0.4 mg Oral Daily Michael Boston, MD   0.4 mg at 09/29/16 0925  . torsemide (DEMADEX) tablet 40 mg  40 mg Oral q1800 Michael Boston, MD      . torsemide [facility]) tablet 80 mg  80 mg Oral Q0600 Michael Boston, MD   80 mg at 09/30/16 3532  . traZODone (DESYREL) tablet 300 mg  300 mg Oral QHS Michael Boston, MD   300 mg at 09/30/16 0001  . vitamin C (ASCORBIC ACID) tablet 2,000 mg  2,000 mg Oral BID Michael Boston, MD   2,000 mg at 09/29/16 2234     Not on File  Signed: Morton Peters, M.D., F.A.C.S. Gastrointestinal and Minimally Invasive Surgery  [facility] [phone] [address] [place], [place] [address] [phone] Main / Paging   09/30/2016, 7:03 AM

## 2016-09-30 NOTE — Progress Notes (Signed)
Pt had trouble voiding after catheter removal.  Had some output on his own, otherwise he was incontinent, not being able to tell he was voiding.  Did a bladder scan and found 751 mls of urine.  In and out catheterized and retrieved 1150 mls of urine.  Will continue to monitor.

## 2016-09-30 NOTE — Clinical Social Work Note (Signed)
Clinical Social Work Assessment  Patient Details  Name: Patrick [name] MRN: [id number] Date of Birth: 1951-01-25  Date of referral:  09/30/16               Reason for consult:  Discharge Planning                Permission sought to share information with:  Facility Art therapist granted to share information::  Yes, Verbal Permission Granted  Name::        Agency::     Relationship::     Contact Information:     Housing/Transportation Living arrangements for the past 2 months:  Apartment Source of Information:  Patient Patient Interpreter Needed:  None Criminal Activity/Legal Involvement Pertinent to Current Situation/Hospitalization:  No - Comment as needed Significant Relationships:  Siblings Lives with:  Siblings Do you feel safe going back to the place where you live?  No (Requesting SNF.) Need for family participation in patient care:  No (Coment)  Care giving concerns:  Pt's care cannot be managed at home following hospital d/c.   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 CSW met with pt at bedside to assist with d/c planning. Pt reports that he is having trouble managing his care at home. Pt reports that he resides with his sister but she was recently placed at [facility]. Pt is requesting placement at [facility] following hospital dc. PT eval is pending. Clinicals have been sent to [facility] for review. PT eval will be sent once available. [facility] will offer pt placement if PT recommends SNF. CSW will continue to follow to assist with d/c planning.  Employment status:  Retired Nurse, adult PT Recommendations:  Not assessed at this time Information / Referral to community resources:  [place]  Patient/Family's Response to care:  Pt would like SNF placement at [name] where his sister has been  placed.  Patient/Family's Understanding of and Emotional Response to Diagnosis, Current Treatment, and Prognosis:  Pt reports that he is feeling a little better today. He is looking forward to working with therapy. Pt appreciates CSW assistance with d/c planning.  Emotional Assessment Appearance:  Appears stated age Attitude/Demeanor/Rapport:  Other (cooperative) Affect (typically observed):  Accepting, Calm, Appropriate Orientation:  Oriented to Self, Oriented to Place, Oriented to  Time, Oriented to Situation Alcohol / Substance use:  Not Applicable Psych involvement (Current and /or in the community):  No (Comment)  Discharge Needs  Concerns to be addressed:  Discharge Planning Concerns Readmission within the last 30 days:  No Current discharge risk:  None Barriers to Discharge:  No Barriers Identified   Luretha Rued, [place] 09/30/2016, 11:56 AM

## 2016-09-30 NOTE — Progress Notes (Signed)
Pharmacy Brief Note - Alvimopan (Entereg)  The standing order set for alvimopan (Entereg) now includes an automatic order to discontinue the drug after the patient has had a bowel movement. The change was approved by the [place] and the Medical Executive Committee.   This patient has had bowel movements documented by nursing. Therefore, alvimopan has been discontinued. If there are questions, please contact the pharmacy at [phone].   Thank you- Dolly Rias RPh 09/30/2016, 10:53 AM Pager [phone]

## 2016-09-30 NOTE — Progress Notes (Signed)
Plan for d/c to SNF, discharge planning per CSW. [phone] 

## 2016-10-01 LAB — CREATININE, SERUM
Creatinine, Ser: 1.3 mg/dL — ABNORMAL HIGH (ref 0.61–1.24)
GFR, EST NON AFRICAN AMERICAN: 55 mL/min — AB (ref >60)

## 2016-10-01 LAB — CBC
HCT: 29.7 % — ABNORMAL LOW (ref 39.0–52.0)
Hemoglobin: 9.9 g/dL — ABNORMAL LOW (ref 13.0–17.0)
MCH: 28.9 pg (ref 26.0–34.0)
MCHC: 33.3 g/dL (ref 30.0–36.0)
MCV: 86.8 fL (ref 78.0–100.0)
PLATELETS: 79 10*3/uL — AB (ref 150–400)
RBC: 3.42 MIL/uL — ABNORMAL LOW (ref 4.22–5.81)
RDW: 16.4 % — AB (ref 11.5–15.5)
WBC: 8.3 10*3/uL (ref 4.0–10.5)

## 2016-10-01 LAB — POTASSIUM: Potassium: 3.8 mmol/L (ref 3.5–5.1)

## 2016-10-01 NOTE — Evaluation (Signed)
Physical Therapy Evaluation Patient Details Name: Patrick [name] MRN: [id number] DOB: 05/09/1951 Today's Date: 10/01/2016   History of Present Illness  Pt s/p abdominoperineal resection with colostomy 2* rectal CA and with hx of CHF, CAD, and DJD  Clinical Impression  Pt admitted as above and presenting with functional mobility limitations 2* post op pain and mild ambulatory balance deficits.  Pt would benefit from follow up rehab at SNF level to maximize IND and safety prior to return home without assistance.    Follow Up Recommendations SNF    Equipment Recommendations  None recommended by PT    Recommendations for Other Services OT consult     Precautions / Restrictions Precautions Precautions: Fall Restrictions Weight Bearing Restrictions: No      Mobility  Bed Mobility Overal bed mobility: Needs Assistance Bed Mobility: Supine to Sit     Supine to sit: Min guard     General bed mobility comments: Increased time and pt utilizing bed rail with HOB elevated to complete transition to EOB sitting  Transfers Overall transfer level: Needs assistance Equipment used: Rolling walker (2 wheeled) Transfers: Sit to/from Stand Sit to Stand: Min assist         General transfer comment: cues for use of UEs to self assist  Ambulation/Gait Ambulation/Gait assistance: Min assist;Min guard Ambulation Distance (Feet): 130 Feet (twice) Assistive device: Rolling walker (2 wheeled) Gait Pattern/deviations: Step-through pattern;Decreased step length - right;Decreased step length - left;Shuffle;Trunk flexed Gait velocity: decr Gait velocity interpretation: Below normal speed for age/gender General Gait Details: cues for posture and position from [employer]            Wheelchair Mobility    Modified Rankin (Stroke Patients Only)       Balance Overall balance assessment: Needs assistance Sitting-balance support: No upper extremity supported;Feet supported Sitting  balance-Leahy Scale: Good     Standing balance support: No upper extremity supported Standing balance-Leahy Scale: Fair                               Pertinent Vitals/Pain Pain Assessment: 0-10 Pain Score: 5  Pain Location: abdomen Pain Descriptors / Indicators: Sore Pain Intervention(s): Limited activity within patient's tolerance;Monitored during session;Premedicated before session    Home Living Family/patient expects to be discharged to:: Skilled nursing facility                      Prior Function Level of Independence: Independent with assistive device(s);Independent         Comments: used cane as needed     Hand Dominance        Extremity/Trunk Assessment   Upper Extremity Assessment Upper Extremity Assessment: Overall WFL for tasks assessed    Lower Extremity Assessment Lower Extremity Assessment: Overall WFL for tasks assessed       Communication   Communication: No difficulties  Cognition Arousal/Alertness: Awake/alert Behavior During Therapy: WFL for tasks assessed/performed Overall Cognitive Status: Within Functional Limits for tasks assessed                                        General Comments      Exercises     Assessment/Plan    PT Assessment Patient needs continued PT services  PT Problem List Decreased activity tolerance;Decreased balance;Decreased mobility;Pain;Decreased knowledge of use of DME  PT Treatment Interventions DME instruction;Gait training;Functional mobility training;Therapeutic activities;Therapeutic exercise;Balance training;Patient/family education    PT Goals (Current goals can be found in the Care Plan section)  Acute Rehab PT Goals Patient Stated Goal: Regain IND PT Goal Formulation: With patient Time For Goal Achievement: 10/08/16 Potential to Achieve Goals: Good    Frequency Min 3X/week   Barriers to discharge Decreased caregiver support Pt without assist at  home.  Pt states he is caregiver for disabled sister who is temporarily in SNF while pt recovers    Co-evaluation               AM-PAC PT "6 Clicks" Daily Activity  Outcome Measure Difficulty turning over in bed (including adjusting bedclothes, sheets and blankets)?: A Little Difficulty moving from lying on back to sitting on the side of the bed? : A Little Difficulty sitting down on and standing up from a chair with arms (e.g., wheelchair, bedside commode, etc,.)?: A Little Help needed moving to and from a bed to chair (including a wheelchair)?: A Little Help needed walking in hospital room?: A Little Help needed climbing 3-5 steps with a railing? : A Lot 6 Click Score: 17    End of Session   Activity Tolerance: Patient tolerated treatment well Patient left: in chair;with call bell/phone within reach Nurse Communication: Mobility status PT Visit Diagnosis: Unsteadiness on feet (R26.81);Difficulty in walking, not elsewhere classified (R26.2)    Time: 6644-0347 PT Time Calculation (min) (ACUTE ONLY): 21 min   Charges:   PT Evaluation $PT Eval Low Complexity: 1 Procedure     PT G Codes:        Pg [phone]   Kimani Hovis 10/01/2016, 1:14 PM

## 2016-10-01 NOTE — Progress Notes (Signed)
3 Days Post-Op robotic APR Subjective: Pt doing well.  Tolerating a diet.  Pain controlled.  Unable to void last night, [place] for >1L.  Awaiting ostomy edu  Objective: Vital signs in last 24 hours: Temp:  [97.6 F (36.4 C)-99 F (37.2 C)] 98.7 F (37.1 C) (05/05 0604) Pulse Rate:  [66-76] 75 (05/05 0604) Resp:  [16] 16 (05/04 2100) BP: (142-157)/(75-81) 154/75 (05/05 0604) SpO2:  [96 %-98 %] 98 % (05/04 2100) Weight:  [91.3 kg (201 lb 4.5 oz)] 91.3 kg (201 lb 4.5 oz) (05/05 0559)   Intake/Output from previous day: 05/04 0701 - 05/05 0700 In: 1440 [P.O.:1440] Out: 2855 [Urine:1985; Drains:190; Stool:680] Intake/Output this shift: Total I/O In: -  Out: 200 [Urine:200]   General appearance: alert and cooperative GI: normal findings: soft, non-tender  Incision: no significant erythema  Lab Results:   Recent Labs  09/30/16 0556 10/01/16 0533  WBC 10.4 8.3  HGB 9.8* 9.9*  HCT 29.2* 29.7*  PLT 89* 79*   BMET  Recent Labs  09/28/16 1324  09/29/16 0349 09/30/16 0556 10/01/16 0533  NA 143  --  139  --   --   K 4.0  --  3.9 3.7 3.8  CL  --   --  105  --   --   CO2  --   --  26  --   --   GLUCOSE  --   --  160*  --   --   BUN  --   --  20  --   --   CREATININE  --   < > 1.37* 1.16 1.30*  CALCIUM  --   --  8.2*  --   --   < > = values in this interval not displayed. PT/INR No results for input(s): LABPROT, INR in the last 72 hours. ABG  Recent Labs  09/28/16 1324  PHART 7.345*  HCO3 28.5*    MEDS, Scheduled . acetaminophen  1,000 mg Oral TID  . carvedilol  25 mg Oral BID WC  . dexamethasone  4 mg Intravenous Q12H  . enoxaparin (LOVENOX) injection  40 mg Subcutaneous Q24H  . lip balm  1 application Topical BID  . mirtazapine  15 mg Oral QHS  . multivitamin with minerals  1 tablet Oral Daily  . potassium chloride SA  20 mEq Oral Daily  . pravastatin  20 mg Oral q1800  . saccharomyces boulardii  250 mg Oral BID  . sodium chloride flush  3 mL Intravenous  Q12H  . tamsulosin  0.4 mg Oral Daily  . torsemide  40 mg Oral q1800  . torsemide  80 mg Oral Q0600  . traZODone  300 mg Oral QHS  . vitamin C  2,000 mg Oral BID    Studies/Results: No results found.  Assessment: s/p Procedure(s): XI ROBOT ABDOMINOPERINEAL RESECTION WITH PERMANENT COLOSTOMY ERAS PATHWAY Patient Active Problem List   Diagnosis Date Noted  . Hypomagnesemia 09/29/2016  . History of resection of rectum 09/28/2016  . Rectal cancer ([place]) 09/28/2016  . Atrial fibrillation ([place]) 09/11/2016  . Coronary artery disease involving native coronary artery of native heart with angina pectoris ([place]) 09/11/2016  . Nonischemic cardiomyopathy ([place]) 09/11/2016  . Acute blood loss anemia 05/29/2016  . Thrombocytopenia ([place]) 05/29/2016  . Iron deficiency anemia due to chronic blood loss 04/15/2016  . Adenocarcinoma of rectum s/p robotic APR/colostomy 09/28/2016 04/14/2016  . Chronic kidney disease (CKD), stage III (moderate) 02/05/2016  . Abnormal finding on cardiovascular stress  test 02/05/2016  . Cardiomyopathy, ischemic 06/17/2015  . Chronic systolic heart failure ([place]) 06/17/2015  . Major depressive disorder, recurrent episode ([place]) 04/17/2015  . Other iron deficiency anemias   . Health care maintenance 03/11/2014  . Obesity, unspecified 10/03/2013  . Dyslipidemia 08/13/2013  . CARDIAC MURMUR 01/26/2010  . MACULAR DEGENERATION 05/02/2008  . ERECTILE DYSFUNCTION 03/13/2007  . Essential hypertension 01/16/2007  . BPH (benign prostatic hyperplasia) 01/16/2007    Expected post op course  Plan: urinary retetnion: will monitor today, straight cath PRN Cont ostomy Education Possible d/c tom or Mon   LOS: 3 days     .Rosario Adie, [facility] [place]   10/01/2016 8:32 AM

## 2016-10-02 DIAGNOSIS — Z87891 Personal history of nicotine dependence: Secondary | ICD-10-CM | POA: Diagnosis not present

## 2016-10-02 DIAGNOSIS — I130 Hypertensive heart and chronic kidney disease with heart failure and stage 1 through stage 4 chronic kidney disease, or unspecified chronic kidney disease: Secondary | ICD-10-CM | POA: Diagnosis not present

## 2016-10-02 DIAGNOSIS — F0630 Mood disorder due to known physiological condition, unspecified: Secondary | ICD-10-CM | POA: Diagnosis not present

## 2016-10-02 DIAGNOSIS — T451X5S Adverse effect of antineoplastic and immunosuppressive drugs, sequela: Secondary | ICD-10-CM | POA: Diagnosis not present

## 2016-10-02 DIAGNOSIS — I5022 Chronic systolic (congestive) heart failure: Secondary | ICD-10-CM | POA: Diagnosis not present

## 2016-10-02 DIAGNOSIS — Z923 Personal history of irradiation: Secondary | ICD-10-CM | POA: Diagnosis not present

## 2016-10-02 DIAGNOSIS — N179 Acute kidney failure, unspecified: Secondary | ICD-10-CM | POA: Diagnosis not present

## 2016-10-02 DIAGNOSIS — Z9221 Personal history of antineoplastic chemotherapy: Secondary | ICD-10-CM | POA: Diagnosis not present

## 2016-10-02 DIAGNOSIS — E875 Hyperkalemia: Secondary | ICD-10-CM | POA: Diagnosis not present

## 2016-10-02 DIAGNOSIS — Z79899 Other long term (current) drug therapy: Secondary | ICD-10-CM | POA: Diagnosis not present

## 2016-10-02 DIAGNOSIS — E785 Hyperlipidemia, unspecified: Secondary | ICD-10-CM | POA: Diagnosis not present

## 2016-10-02 DIAGNOSIS — C20 Malignant neoplasm of rectum: Secondary | ICD-10-CM | POA: Diagnosis not present

## 2016-10-02 DIAGNOSIS — R748 Abnormal levels of other serum enzymes: Secondary | ICD-10-CM | POA: Diagnosis not present

## 2016-10-02 DIAGNOSIS — N183 Chronic kidney disease, stage 3 (moderate): Secondary | ICD-10-CM | POA: Diagnosis not present

## 2016-10-02 DIAGNOSIS — D500 Iron deficiency anemia secondary to blood loss (chronic): Secondary | ICD-10-CM | POA: Diagnosis not present

## 2016-10-02 DIAGNOSIS — E876 Hypokalemia: Secondary | ICD-10-CM | POA: Diagnosis not present

## 2016-10-02 DIAGNOSIS — R0602 Shortness of breath: Secondary | ICD-10-CM | POA: Diagnosis not present

## 2016-10-02 DIAGNOSIS — I480 Paroxysmal atrial fibrillation: Secondary | ICD-10-CM | POA: Diagnosis not present

## 2016-10-02 DIAGNOSIS — R45851 Suicidal ideations: Secondary | ICD-10-CM | POA: Diagnosis present

## 2016-10-02 DIAGNOSIS — N390 Urinary tract infection, site not specified: Secondary | ICD-10-CM | POA: Diagnosis not present

## 2016-10-02 DIAGNOSIS — D649 Anemia, unspecified: Secondary | ICD-10-CM | POA: Diagnosis not present

## 2016-10-02 DIAGNOSIS — Z9049 Acquired absence of other specified parts of digestive tract: Secondary | ICD-10-CM | POA: Diagnosis not present

## 2016-10-02 DIAGNOSIS — I10 Essential (primary) hypertension: Secondary | ICD-10-CM | POA: Diagnosis not present

## 2016-10-02 DIAGNOSIS — Z792 Long term (current) use of antibiotics: Secondary | ICD-10-CM | POA: Diagnosis not present

## 2016-10-02 DIAGNOSIS — N401 Enlarged prostate with lower urinary tract symptoms: Secondary | ICD-10-CM | POA: Diagnosis not present

## 2016-10-02 DIAGNOSIS — Z85048 Personal history of other malignant neoplasm of rectum, rectosigmoid junction, and anus: Secondary | ICD-10-CM | POA: Diagnosis not present

## 2016-10-02 DIAGNOSIS — N281 Cyst of kidney, acquired: Secondary | ICD-10-CM | POA: Diagnosis not present

## 2016-10-02 DIAGNOSIS — M25561 Pain in right knee: Secondary | ICD-10-CM | POA: Diagnosis not present

## 2016-10-02 DIAGNOSIS — C801 Malignant (primary) neoplasm, unspecified: Secondary | ICD-10-CM | POA: Diagnosis not present

## 2016-10-02 DIAGNOSIS — M19071 Primary osteoarthritis, right ankle and foot: Secondary | ICD-10-CM | POA: Diagnosis not present

## 2016-10-02 DIAGNOSIS — N19 Unspecified kidney failure: Secondary | ICD-10-CM | POA: Diagnosis not present

## 2016-10-02 DIAGNOSIS — I4891 Unspecified atrial fibrillation: Secondary | ICD-10-CM | POA: Diagnosis not present

## 2016-10-02 DIAGNOSIS — R079 Chest pain, unspecified: Secondary | ICD-10-CM | POA: Diagnosis not present

## 2016-10-02 DIAGNOSIS — Z7982 Long term (current) use of aspirin: Secondary | ICD-10-CM | POA: Diagnosis not present

## 2016-10-02 DIAGNOSIS — N400 Enlarged prostate without lower urinary tract symptoms: Secondary | ICD-10-CM | POA: Diagnosis not present

## 2016-10-02 DIAGNOSIS — K625 Hemorrhage of anus and rectum: Secondary | ICD-10-CM | POA: Diagnosis not present

## 2016-10-02 DIAGNOSIS — G8929 Other chronic pain: Secondary | ICD-10-CM | POA: Diagnosis not present

## 2016-10-02 DIAGNOSIS — I428 Other cardiomyopathies: Secondary | ICD-10-CM | POA: Diagnosis not present

## 2016-10-02 DIAGNOSIS — I255 Ischemic cardiomyopathy: Secondary | ICD-10-CM | POA: Diagnosis not present

## 2016-10-02 DIAGNOSIS — I25119 Atherosclerotic heart disease of native coronary artery with unspecified angina pectoris: Secondary | ICD-10-CM | POA: Diagnosis not present

## 2016-10-02 DIAGNOSIS — E860 Dehydration: Secondary | ICD-10-CM | POA: Diagnosis not present

## 2016-10-02 DIAGNOSIS — Z22322 Carrier or suspected carrier of Methicillin resistant Staphylococcus aureus: Secondary | ICD-10-CM | POA: Diagnosis not present

## 2016-10-02 DIAGNOSIS — D6959 Other secondary thrombocytopenia: Secondary | ICD-10-CM | POA: Diagnosis not present

## 2016-10-02 DIAGNOSIS — I5042 Chronic combined systolic (congestive) and diastolic (congestive) heart failure: Secondary | ICD-10-CM | POA: Diagnosis not present

## 2016-10-02 DIAGNOSIS — Z933 Colostomy status: Secondary | ICD-10-CM | POA: Diagnosis not present

## 2016-10-02 DIAGNOSIS — I2510 Atherosclerotic heart disease of native coronary artery without angina pectoris: Secondary | ICD-10-CM | POA: Diagnosis not present

## 2016-10-02 DIAGNOSIS — D696 Thrombocytopenia, unspecified: Secondary | ICD-10-CM | POA: Diagnosis not present

## 2016-10-02 LAB — CREATININE, SERUM
CREATININE: 1.28 mg/dL — AB (ref 0.61–1.24)
GFR calc Af Amer: >60 mL/min (ref >60)
GFR, EST NON AFRICAN AMERICAN: 56 mL/min — AB (ref >60)

## 2016-10-02 LAB — TYPE AND SCREEN
ABO/RH(D): O NEG
ANTIBODY SCREEN: NEGATIVE
UNIT DIVISION: 0
Unit division: 0

## 2016-10-02 LAB — BPAM RBC
BLOOD PRODUCT EXPIRATION DATE: 201806052359
Blood Product Expiration Date: 201806052359
ISSUE DATE / TIME: 201805021435
Unit Type and Rh: 9500
Unit Type and Rh: 9500

## 2016-10-02 LAB — CBC
HCT: 31.1 % — ABNORMAL LOW (ref 39.0–52.0)
HEMOGLOBIN: 10.3 g/dL — AB (ref 13.0–17.0)
MCH: 28.6 pg (ref 26.0–34.0)
MCHC: 33.1 g/dL (ref 30.0–36.0)
MCV: 86.4 fL (ref 78.0–100.0)
Platelets: 97 10*3/uL — ABNORMAL LOW (ref 150–400)
RBC: 3.6 MIL/uL — ABNORMAL LOW (ref 4.22–5.81)
RDW: 16.3 % — AB (ref 11.5–15.5)
WBC: 8.5 10*3/uL (ref 4.0–10.5)

## 2016-10-02 LAB — POTASSIUM: Potassium: 3.7 mmol/L (ref 3.5–5.1)

## 2016-10-02 NOTE — Social Work (Addendum)
Clinical Social Worker facilitated patient discharge including contacting patient family and facility to confirm patient discharge plans. CSW unable to reach sister and patient indicated her phone does not work.  No other family to contact at this time.  Clinical information faxed to facility and family agreeable with plan.  CSW arranged ambulance transport via [facility] to [name].  Facility will call RN for report prior to discharge.  Clinical Social Worker will sign off for now as social work intervention is no longer needed. Please consult [place] again if new need arises.  Elissa Hefty, LCSW Clinical Social Worker [phone]

## 2016-10-02 NOTE — Discharge Instructions (Addendum)
Ostomy Support Information ° °You’ve heard that people get along just fine with only one of their eyes, or one of their lungs, or one of their kidneys. But you also know that you have only one intestine and only one bladder, and that leaves you feeling awfully empty, both physically and emotionally: You think no other people go around without part of their intestine with the ends of their intestines sticking out through their abdominal walls.  ° °YOU ARE NOT ALONE.  There are nearly three quarters of a million people in the [place] who have an ostomy; people who have had surgery to remove all or part of their colons or bladders.  ° °There is even a national association, the [employer] with over 350 local affiliated support groups that are organized by volunteers who provide peer support and counseling. [employer] has a toll free telephone num-ber, 800-826-0826 and an educational, interactive website, www.ostomy.org  ° °An ostomy is an opening in the belly (abdominal wall) made by surgery. Ostomates are people who have had this procedure. The opening (stoma) allows the kidney or bowel to discharge waste. An external pouch covers the stoma to collect waste. Pouches are are a simple bag and are odor free. Different companies have disposable or reusable pouches to fit one's lifestyle. An ostomy can either be temporary or permanent.  ° °THERE ARE THREE MAIN TYPES OF OSTOMIES °· Colostomy. A colostomy is a surgically created opening in the large intestine (colon). °· Ileostomy. An ileostomy is a surgically created opening in the small intestine. °· Urostomy. A urostomy is a surgically created opening to divert urine away from the bladder. ° °OSTOMY Care ° °The following guidelines will make care of your colostomy easier. Keep this information close by for quick reference. ° °Helpful DIET hints °Eat a well-balanced diet including vegetables and fresh fruits. Eat on a regular schedule. Drink at least 6 to 8  glasses of fluids daily. °Eat slowly in a relaxed atmosphere. Chew your food thoroughly. Avoid chewing gum, smoking, and drinking from a straw. This will help decrease the amount of air you swallow, which may help reduce gas. °Eating yogurt or drinking buttermilk may help reduce gas. ° °To control gas at night, do not eat after 8 p.m. This will give your bowel time to quiet down before you go to bed. ° °If gas is a problem, you can purchase Beano. Sprinkle Beano on the first bite of food before eating to reduce gas. It has no flavor and should not change the taste of your food. You can buy Beano over the counter at your local drugstore. ° °Foods like fish, onions, garlic, broccoli, asparagus, and cabbage produce odor. Although your pouch is odor-proof, if you eat these foods you may notice a stronger odor when emptying your pouch. If this is a concern, you may want to limit these foods in your diet. ° °If you have an ileostomy, you will have chronic diarrhea & need to drink more liquids to avoid getting dehydrated.  Consider antidiarrheal medicine like imodium (loperamide) or Lomotil to help slow down bowel movements / diarrhea into your ileostomy bag. ° °GETTING TO GOOD BOWEL HEALTH WITH AN ILEOSTOMY °. °Irregular bowel habits such as constipation and diarrhea can lead to many problems over time.  The goal: 3-6 small BOWEL MOVEMENTS A DAY!  To have soft, regular bowel movements:  °• Drink plenty of fluids, consider 4-6 tall glasses of water a day.   ° °Controlling   diarrheA ° °o Switch to liquids and simpler foods for a few days to avoid stressing your intestines further. °o Avoid dairy products (especially milk & ice cream) for a short time.  The intestines often can lose the ability to digest lactose when stressed. °o Avoid foods that cause gassiness or bloating.  Typical foods include beans and other legumes, cabbage, broccoli, and dairy foods.  Every person has some sensitivity to other foods, so listen to our  body and avoid those foods that trigger problems for you. °o Adding fiber (Citrucel, Metamucil, psyllium, Miralax) gradually can help thicken stools by absorbing excess fluid and retrain the intestines to act more normally.  Slowly increase the dose over a few weeks.  Too much fiber too soon can backfire and cause cramping & bloating. °o Probiotics (such as active yogurt, Align, etc) may help repopulate the intestines and colon with normal bacteria and calm down a sensitive digestive tract.  Most studies show it to be of mild help, though, and such products can be costly. °o Medicines: °- Bismuth subsalicylate (ex. Kayopectate, Pepto Bismol) every 30 minutes for up to 6 doses can help control diarrhea.  Avoid if pregnant. °- Loperamide (Immodium) can slow down diarrhea.  Start with two tablets (4mg total) first and then try one tablet every 6 hours.  Avoid if you are having fevers or severe pain.  If you are not better or start feeling worse, stop all medicines and call your doctor for advice °o Call your doctor if you are getting worse or not better.  Sometimes further testing (cultures, endoscopy, X-ray studies, bloodwork, etc) may be needed to help diagnose and treat the cause of the diarrhea. ° °TROUBLESHOOTING IRREGULAR BOWELS °1) Avoid extremes of bowel movements (no bad constipation/diarrhea) °2) Miralax 17gm mixed in 8oz. water or juice-daily. May use twice a day as needed  °3) Gas-x,Phazyme, etc. as needed for gas & bloating.  °4) Soft,bland diet. No spicy,greasy,fried foods.  °5) Prilosec (omeprazole) over-the-counter as needed  °6) May hold gluten/wheat products from diet to see if symptoms improve.  °7)  May try probiotics (Align, Activa, etc) to help calm the bowels down °7) If symptoms become worse call back immediately. ° ° °Applying the pouching system °To apply your pouch, follow these steps: ° °Place all your equipment close at hand before removing your pouch. ° °Wash your hands. ° °Stand or sit in  front of a mirror. Use the position that works best for you. Remember that you must keep the skin around the stoma wrinkle-free for a good seal. ° °Gently remove the used pouch (1-piece system) or the pouch and old wafer (2-piece system). Empty the pouch into the toilet. Save the closure clip to use again. ° °Wash the stoma itself and the skin around the stoma. Your stoma may bleed a little when being washed. This is normal. Rinse and pat dry. You may use a wash cloth or soft paper towels (like Bounty), mild soap (like Dial, Safeguard, or Ivory), and water. Avoid soaps that contain perfumes or lotions. ° °For a new pouch (1-piece system) or a new wafer (2-piece system), measure your stoma using the stoma guide in each box of supplies. ° °Trace the shape of your stoma onto the back of the new pouch or the back of the new wafer. Cut out the opening. Remove the paper backing and set it aside. ° °Optional: Apply a skin barrier powder to surrounding skin if it is irritated (bare or weeping),   and dust off the excess. °Optional: Apply a skin-prep wipe (such as Skin Prep or All-Kare) to the skin around the stoma, and let it dry. Do not apply this solution if the skin is irritated (red, tender, or broken) or if you have shaved around the stoma. °Optional: Apply a skin barrier paste (such as Stomahesive, Coloplast, or Premium) around the opening cut in the back of the pouch or wafer. Allow it to dry for 30 to 60 seconds. ° °Hold the pouch (1-piece system) or wafer (2-piece system) with the sticky side toward your body. Make sure the skin around the stoma is wrinkle-free. Center the opening on the stoma, then press firmly to your abdomen (Fig. 4). Look in the mirror to check if you are placing the pouch, or wafer, in the right position. For a 2-piece system, snap the pouch onto the wafer. Make sure it snaps into place securely. ° °Place your hand over the stoma and the pouch or wafer for about 30 seconds. The heat from your  hand can help the pouch or wafer stick to your skin. ° °Add deodorant (such as Super Banish or Nullo) to your pouch. Other options include food extracts such as vanilla oil and peppermint extract. Add about 10 drops of the deodorant to the pouch. Then apply the closure clamp. Note: Do not use toxic ° chemicals or commercial cleaning agents in your pouch. These substances may harm the stoma. ° °Optional: For extra seal, apply tape to all 4 sides around the pouch or wafer, as if you were framing a picture. You may use any brand of medical adhesive tape. °Change your pouch every 5 to 7 days. Change it immediately if a leak occurs.  Wash your hands afterwards. ° °If you are wearing a 2-piece system, you may use 2 new pouches per week and alternate them. Rinse the pouch with mild soap and warm water and hang it to dry for the next day. Apply the fresh pouch. Alternate the 2 pouches like this for a week. After a week, change the wafer and begin with 2 new pouches. Place the old pouches in a plastic bag, and put them in the trash. ° ° ° °Tips for colostomy care ° °Applying Your Pouch °You may stand or sit to apply your pouch. ° °Keep the skin where you apply the pouch wrinkle-free. If the skin around the pouch is wrinkled, the seal may break when your skin stretches. ° °If hair grows close to your stoma, you may trim off the hair with scissors, an electric razor, or a safety razor. ° °Always have a mirror nearby so you can get a better view of your stoma. ° °When you apply a new pouch, write the date on the adhesive tape. This will remind you of when you last changed your pouch. ° °Changing Your Pouch °The best time to change your pouch is in the morning, before eating or drinking anything. Your stoma can function at any time, but it will function more after eating or drinking. ° °Emptying Your Pouch °Empty your pouch when it is one-third full (of urine, stool, and/or gas). If you wait until your pouch is fuller than this,  it will be more difficult to empty and more noticeable. °When you empty your pouch, either put toilet paper in the toilet bowl first, or flush the toilet while you empty the pouch. This will reduce splashing. You can empty the pouch between your legs or to one side while sitting,   or while standing or stooping. If you have a 2-piece system, you can snap off the pouch to empty it. Remember that your stoma may function during this time. °If you wish to rinse your pouch after you empty it, a turkey baster can be helpful. When using a baster, squirt water up into the pouch through the opening at the °bottom. With a 2-piece system, you can snap off the pouch to rinse it. After rinsing  your pouch, empty it into the toilet. °When rinsing your pouch at home, put a few granules of Dreft soap in the rinse water. This helps lubricate and freshen your pouch. °The inside of your pouch can be sprayed with non-stick cooking oil (Pam spray). This may help reduce stool sticking to the inside of the pouch. ° °Bathing °You may shower or bathe with your pouch on or off. Remember that your stoma may function during this time. ° °The materials you use to wash your stoma and the skin around it should be clean, but they do not need to be sterile. ° °Wearing Your Pouch °During hot weather, or if you perspire a lot in general, wear a cover over your pouch. This may prevent a rash on your skin under the pouch. Pouch covers are sold at ostomy supply stores. °Wear the pouch inside your underwear for better support. °Watch your weight. Any gain or loss of 10 to 15 pounds or more can change the way your pouch fits. ° °Going Away From Home °A collapsible cup (like those that come in travel kits) or a soft plastic squirt bottle with a pull-up top (like a travel bottle for shampoo) can be used for rinsing your pouch when you are away from home. Tilt the opening of the pouch at an upward angle when using a cup to rinse. ° °Carry wet wipes or extra  tissues to use in public bathrooms. ° °Carry an extra pouching system with you at all times. ° °Never keep ostomy supplies in the glove compartment of your car. Extreme heat or cold can damage the skin barriers and adhesive wafers on the pouch. ° °When you travel, carry your ostomy supplies with you at all times. Keep them within easy reach. Do not pack ostomy supplies in baggage that will be checked or otherwise separated from you, because your baggage might be lost. If you’re traveling out of the country, it is helpful to have a letter stating that you are carrying ostomy supplies as a medical necessity. ° °If you need ostomy supplies while traveling, look in the yellow pages of the telephone book under “Surgical Supplies.” Or call the local ostomy organization to find out where supplies are available. ° °Do not let your ostomy supplies get low. Always order new pouches before you use the last one. ° °Reducing Odor °Limit foods such as broccoli, cabbage, onions, fish, and garlic in your diet to help reduce odor. °Each time you empty your pouch, carefully clean the opening of the pouch, both inside and outside, with toilet paper. °Rinse your pouch 1 or 2 times daily after you empty it (see directions for emptying your pouch and going away from home). °Add deodorant (such as Super Banish or Nullo) to your pouch. °Use air deodorizers in your bathroom. °Do not add aspirin to your pouch. Even though aspirin can help prevent odor, it could cause ulcers on your stoma. ° °When to call the doctor °Call the doctor if you have any of the following symptoms: °Purple, black,   or white stoma Severe cramps lasting more than 6 hours Severe watery discharge from the stoma lasting more than 6 hours No output from the colostomy for 3 days Excessive bleeding from your stoma Swelling of your stoma to more than 1/2-inch larger than usual Pulling inward of your stoma below skin level Severe skin irritation or deep ulcers Bulging  or other changes in your abdomen  When to call your ostomy nurse Call your ostomy/enterostomal therapy (ET) nurse if any of the following occurs: Frequent leaking of your pouching system Change in size or appearance of your stoma, causing discomfort or problems with your pouch Skin rash or rawness Weight gain or loss that causes problems with your pouch      FREQUENTLY ASKED QUESTIONS   Why havent you met any of these folks who have an ostomy?  Well, maybe you have! You just did not recognize them because an ostomy doesn't show. It can be kept secret if you wish. Why, maybe some of your best friends, office associates or neighbors have an ostomy ... you never can tell.   People facing ostomy surgery have many quality-of-life questions like:  Will you bulge? Smell? Make noises? Will you feel waste leaving your body? Will you be a captive of the toilet? Will you starve? Be a social outcast? Get/stay married? Have babies? Easily bathe, go swimming, bend over?  OK, lets look at what you can expect:   Will you bulge?  Remember, without part of the intestine or bladder, and its contents, you should have a flatter tummy than before. You can expect to wear, with little exception, what you wore before surgery ... and this in-cludes tight clothing and bathing suits.   Will you smell?  Today, thanks to modern odor proof pouching systems, you can walk into an ostomy support group meeting and not smell anything that is foul or offensive. And, for those with an ileostomy or colostomy who are concerned about odor when emptying their pouch, there are in-pouch deodorants that can be used to eliminate any waste odors that may exist.   Will you make noises?  Everyone produces gas, especially if they are an air-swallower. But intestinal sounds that occur from time to time are no differ-ent than a gurgling tummy, and quite often your clothing will muffle any sounds.   Will you feel the waste discharges?   For those with a colostomy or ileostomy there might be a slight pressure when waste leaves your body, but understand that the intestines have no nerve endings, so there will be no unpleasant sensations. Those with a urostomy will probably be unaware of any kidney drainage.   Will you be a captive of the toilet?  Immediately post-op you will spend more time in the bathroom than you will after your body recovers from surgery. Every person is different, but on average those with an ileostomy or urostomy may empty their pouches 4 to 6 times a day; a little  less if you have a colostomy. The average wear time between pouch system changes is 3 to 5 days and the changing process should take less than 30 minutes.   Will I need to be on a special diet? Most people return to their normal diet when they have recovered from surgery. Be sure to chew your food well, eat a well-balanced diet and drink plenty of fluids. If you experience problems with a certain food, wait a couple of weeks and try it again.  Will there be  odor and noises? °Pouching systems are designed to be odor-proof or odor-resistant. There are deodorants that can be used in the pouch. Medications are also available to help reduce odor. Limit gas-producing foods and carbonated beverages. You will experience less gas and fewer noises as you heal from surgery. ° °How much time will it take to care for my ostomy? °At first, you may spend a lot of time learning about your ostomy and how to take care of it. As you become more comfortable and skilled at changing the pouching system, it will take very little time to care for it.  ° °Will I be able to return to work? °People with ostomies can perform most jobs. As soon as you have healed from surgery, you should be able to return to work. Heavy lifting (more than 10 pounds) may be discouraged.  ° °What about intimacy? °Sexual relationships and intimacy are important and fulfilling aspects of your life. They  should continue after ostomy surgery. Intimacy-related concerns should be discussed openly between you and your partner.  ° °Can I wear regular clothing? °You do not need to wear special clothing. Ostomy pouches are fairly flat and barely noticeable. Elastic undergarments will not hurt the stoma or prevent the ostomy from functioning.  ° °Can I participate in sports? °An ostomy should not limit your involvement in sports. Many people with ostomies are runners, skiers, swimmers or participate in other active lifestyles. Talk with your caregiver first before doing heavy physical activity. ° °Will you starve?  °Not if you follow doctor’s orders at each stage of your post-op adjustment. There is no such thing as an “ostomy diet”. Some people with an ostomy will be able to eat and tolerate anything; others may find diffi-culty with some foods. Each person is an individual and must determine, by trial, what is best for them. A good practice for all is to drink plenty of water.  ° °Will you be a social outcast?  °Have you met anyone who has an ostomy and is a social outcast? Why should you be the first? Only your attitude and self image will effect how you are treated. No confi-dent person is an outcast.  ° ° ° °PROFESSIONAL HELP  °Resources are available if you need help or have questions about your ostomy.  ° °· Specially trained nurses called Wound, Ostomy Continence Nurses (WOCN) are available for consultation in most major medical centers. ° °· Consider getting an ostomy consult with Kathy Probst at [facility] to help troubleshoot stoma pouch fittings and other issues with your ostomy: [phone] ° °· The United Ostomy Association (UOA) is a group made up of many local chapters throughout the [place]. These local groups hold meetings and provide support to prospective and existing ostomates. They sponsor educational events and have qualified visitors to make personal or telephone visits. Contact  the UOA for the chapter nearest you and for other educational publications. ° °· More detailed information can be found in Colostomy Guide, a publication of the United Ostomy Association (UOA). Contact UOA at 1-800-826-0826 or visit their web site at www.uoaa.org. The website contains links to other sites, suppliers and resources. ° °· [facility] Secure Start Services: °· Start at the website to enlist for support.  Your Wound Ostomy (WOCN) nurse may have started this process. https://www.[facility].com/en/securestart °· Secure Start services are designed to support people as they live their lives with an ostomy or neurogenic bladder. Enrolling is easy and at no cost to the   patient. We realize that each person's needs and life journey are different. Through Secure Start services, we want to help people live their life, their way.  SURGERY: POST OP INSTRUCTIONS (Surgery for small bowel obstruction, colon resection, etc)   ######################################################################  EAT Gradually transition to a high fiber diet with a fiber supplement over the next few days after discharge  WALK Walk an hour a day.  Control your pain to do that.    CONTROL PAIN Control pain so that you can walk, sleep, tolerate sneezing/coughing, go up/down stairs.  HAVE A BOWEL MOVEMENT DAILY Keep your bowels regular to avoid problems.  OK to try a laxative to override constipation.  OK to use an antidairrheal to slow down diarrhea.  Call if not better after 2 tries  CALL IF YOU HAVE PROBLEMS/CONCERNS Call if you are still struggling despite following these instructions. Call if you have concerns not answered by these instructions  ######################################################################   DIET Follow a light diet the first few days at home.  Start with a bland diet such as soups, liquids, starchy foods, low fat foods, etc.  If you feel full, bloated, or constipated, stay on a ful  liquid or pureed/blenderized diet for a few days until you feel better and no longer constipated. Be sure to drink plenty of fluids every day to avoid getting dehydrated (feeling dizzy, not urinating, etc.). Gradually add a fiber supplement to your diet over the next week.  Gradually get back to a regular solid diet.  Avoid fast food or heavy meals the first week as you are more likely to get nauseated. It is expected for your digestive tract to need a few months to get back to normal.  It is common for your bowel movements and stools to be irregular.  You will have occasional bloating and cramping that should eventually fade away.  Until you are eating solid food normally, off all pain medications, and back to regular activities; your bowels will not be normal. Focus on eating a low-fat, high fiber diet the rest of your life (See Getting to [place], below).  CARE of your INCISION or WOUND It is good for closed incision and even open wounds to be washed every day.  Shower every day.  Short baths are fine.  Wash the incisions and wounds clean with soap & water.    If you have a closed incision(s), wash the incision with soap & water every day.  You may leave closed incisions open to air if it is dry.   You may cover the incision with clean gauze & replace it after your daily shower for comfort. If you have skin tapes (Steristrips) or skin glue (Dermabond) on your incision, leave them in place.  They will fall off on their own like a scab.  You may trim any edges that curl up with clean scissors.  If you have staples, set up an appointment for them to be removed in the office in 10 days after surgery.  If you have a drain, wash around the skin exit site with soap & water and place a new dressing of gauze or band aid around the skin every day.  Keep the drain site clean & dry.    If you have an open wound with packing, see wound care instructions.  In general, it is encouraged that you remove your  dressing and packing, shower with soap & water, and replace your dressing once a day.  Pack the  wound with clean gauze moistened with normal (0.9%) saline to keep the wound moist & uninfected.  Pressure on the dressing for 30 minutes will stop most wound bleeding.  Eventually your body will heal & pull the open wound closed over the next few months.  °Raw open wounds will occasionally bleed or secrete yellow drainage until it heals closed.  Drain sites will drain a little until the drain is removed.  Even closed incisions can have mild bleeding or drainage the first few days until the skin edges scab over & seal.   °If you have an open wound with a wound vac, see wound vac care instructions. ° ° ° ° °ACTIVITIES as tolerated °Start light daily activities --- self-care, walking, climbing stairs-- beginning the day after surgery.  Gradually increase activities as tolerated.  Control your pain to be active.  Stop when you are tired.  Ideally, walk several times a day, eventually an hour a day.   °Most people are back to most day-to-day activities in a few weeks.  It takes 4-8 weeks to get back to unrestricted, intense activity. °If you can walk 30 minutes without difficulty, it is safe to try more intense activity such as jogging, treadmill, bicycling, low-impact aerobics, swimming, etc. °Save the most intensive and strenuous activity for last (Usually 4-8 weeks after surgery) such as sit-ups, heavy lifting, contact sports, etc.  Refrain from any intense heavy lifting or straining until you are off narcotics for pain control.  You will have off days, but things should improve week-by-week. °DO NOT PUSH THROUGH PAIN.  Let pain be your guide: If it hurts to do something, Josie't do it.  Pain is your body warning you to avoid that activity for another week until the pain goes down. °You may drive when you are no longer taking narcotic prescription pain medication, you can comfortably wear a seatbelt, and you can safely make  sudden turns/stops to protect yourself without hesitating due to pain. °You may have sexual intercourse when it is comfortable. If it hurts to do something, stop. ° °MEDICATIONS °Take your usually prescribed home medications unless otherwise directed.   °Blood thinners:  °Usually you can restart any strong blood thinners after the second postoperative day.  It is OK to take aspirin right away.    ° If you are on strong blood thinners (warfarin/Coumadin, Plavix, Xerelto, Eliquis, Pradaxa, etc), discuss with your surgeon, medicine PCP, and/or cardiologist for instructions on when to restart the blood thinner & if blood monitoring is needed (PT/INR blood check, etc).   ° ° °PAIN CONTROL °Pain after surgery or related to activity is often due to strain/injury to muscle, tendon, nerves and/or incisions.  This pain is usually short-term and will improve in a few months.  °To help speed the process of healing and to get back to regular activity more quickly, DO THE FOLLOWING THINGS TOGETHER: °1. Increase activity gradually.  DO NOT PUSH THROUGH PAIN °2. Use Ice and/or Heat °3. Try Gentle Massage and/or Stretching °4. Take over the counter pain medication °5. Take Narcotic prescription pain medication for more severe pain ° °Good pain control = faster recovery.  It is better to take more medicine to be more active than to stay in bed all day to avoid medications. °1.  Increase activity gradually °Avoid heavy lifting at first, then increase to lifting as tolerated over the next 6 weeks. °Do not “push through” the pain.  Listen to your body and avoid positions and maneuvers   than reproduce the pain.  Wait a few days before trying something more intense °Walking an hour a day is encouraged to help your body recover faster and more safely.  Start slowly and stop when getting sore.  If you can walk 30 minutes without stopping or pain, you can try more intense activity (running, jogging, aerobics, cycling, swimming, treadmill,  sex, sports, weightlifting, etc.) °Remember: If it hurts to do it, then Keimari’t do it! °2. Use Ice and/or Heat °You will have swelling and bruising around the incisions.  This will take several weeks to resolve. °Ice packs or heating pads (6-8 times a day, 30-60 minutes at a time) will help sooth soreness & bruising. °Some people prefer to use ice alone, heat alone, or alternate between ice & heat.  Experiment and see what works best for you.  Consider trying ice for the first few days to help decrease swelling and bruising; then, switch to heat to help relax sore spots and speed recovery. °Shower every day.  Short baths are fine.  It feels good!  Keep the incisions and wounds clean with soap & water.   °3. Try Gentle Massage and/or Stretching °Massage at the area of pain many times a day °Stop if you feel pain - do not overdo it °4. Take over the counter pain medication °This helps the muscle and nerve tissues become less irritable and calm down faster °Choose ONE of the following over-the-counter anti-inflammatory medications: °Acetaminophen 500mg tabs (Tylenol) 1-2 pills with every meal and just before bedtime (avoid if you have liver problems or if you have acetaminophen in you narcotic prescription) °Naproxen 220mg tabs (ex. Aleve, Naprosyn) 1-2 pills twice a day (avoid if you have kidney, stomach, IBD, or bleeding problems) °Ibuprofen 200mg tabs (ex. Advil, Motrin) 3-4 pills with every meal and just before bedtime (avoid if you have kidney, stomach, IBD, or bleeding problems) °Take with food/snack several times a day as directed for at least 2 weeks to help keep pain / soreness down & more manageable. °5. Take Narcotic prescription pain medication for more severe pain °A prescription for strong pain control is often given to you upon discharge (for example: oxycodone/Percocet, hydrocodone/Norco/Vicodin, or tramadol/Ultram) °Take your pain medication as prescribed. °Be mindful that most narcotic prescriptions  contain Tylenol (acetaminophen) as well - avoid taking too much Tylenol. °If you are having problems/concerns with the prescription medicine (does not control pain, nausea, vomiting, rash, itching, etc.), please call us [phone] to see if we need to switch you to a different pain medicine that will work better for you and/or control your side effects better. °If you need a refill on your pain medication, you must call the office before 4 pm and on weekdays only.  By federal law, prescriptions for narcotics cannot be called into a pharmacy.  They must be filled out on paper & picked up from our office by the patient or authorized caretaker.  Prescriptions cannot be filled after 4 pm nor on weekends.   ° °WHEN TO CALL US [phone] °Severe uncontrolled or worsening pain  °Fever over 101 F (38.5 C) °Concerns with the incision: Worsening pain, redness, rash/hives, swelling, bleeding, or drainage °Reactions / problems with new medications (itching, rash, hives, nausea, etc.) °Nausea and/or vomiting °Difficulty urinating °Difficulty breathing °Worsening fatigue, dizziness, lightheadedness, blurred vision °Other concerns °If you are not getting better after two weeks or are noticing you are getting worse, contact our office [phone] for further advice.  We may   need to adjust your medications, re-evaluate you in the office, send you to the emergency room, or see what other things we can do to help. °The clinic staff is available to answer your questions during regular business hours (8:30am-5pm).  Please Anand’t hesitate to call and ask to speak to one of our nurses for clinical concerns.    °A surgeon from [facility] is always on call at the hospitals 24 hours/day °If you have a medical emergency, go to the nearest emergency room or call 911. ° °FOLLOW UP in our office °One the day of your discharge from the hospital (or the next business weekday), please call [facility] to set up  or confirm an appointment to see your surgeon in the office for a follow-up appointment.  Usually it is 2-3 weeks after your surgery.   °If you have skin staples at your incision(s), let the office know so we can set up a time in the office for the nurse to remove them (usually around 10 days after surgery). °Make sure that you call for appointments the day of discharge (or the next business weekday) from the hospital to ensure a convenient appointment time. °IF YOU HAVE DISABILITY OR FAMILY LEAVE FORMS, BRING THEM TO THE OFFICE FOR PROCESSING.  DO NOT GIVE THEM TO YOUR DOCTOR. ° °[facility] °[address], [address], [place], [place]  [address] ? °[phone] - Main °[phone] - Toll Free,  [phone] - Fax °[email or web address] ° °GETTING TO GOOD BOWEL HEALTH. °It is expected for your digestive tract to need a few months to get back to normal.  It is common for your bowel movements and stools to be irregular.  You will have occasional bloating and cramping that should eventually fade away.  Until you are eating solid food normally, off all pain medications, and back to regular activities; your bowels will not be normal.   °Avoiding constipation °The goal: ONE SOFT BOWEL MOVEMENT A DAY!    °Drink plenty of fluids.  Choose water first. °TAKE A FIBER SUPPLEMENT EVERY DAY THE REST OF YOUR LIFE °During your first week back home, gradually add back a fiber supplement every day °Experiment which form you can tolerate.   There are many forms such as powders, tablets, wafers, gummies, etc °Psyllium bran (Metamucil), methylcellulose (Citrucel), Miralax or Glycolax, Benefiber, Flax Seed.  °Adjust the dose week-by-week (1/2 dose/day to 6 doses a day) until you are moving your bowels 1-2 times a day.  Cut back the dose or try a different fiber product if it is giving you problems such as diarrhea or bloating. °Sometimes a laxative is needed to help jump-start bowels if constipated  until the fiber supplement can help regulate your bowels.  If you are tolerating eating & you are farting, it is okay to try a gentle laxative such as double dose MiraLax, prune juice, or Milk of Magnesia.  Avoid using laxatives too often. °Stool softeners can sometimes help counteract the constipating effects of narcotic pain medicines.  It can also cause diarrhea, so avoid using for too long. °If you are still constipated despite taking fiber daily, eating solids, and a few doses of laxatives, call our office. °Controlling diarrhea °Try drinking liquids and eating bland foods for a few days to avoid stressing your intestines further. °Avoid dairy products (especially milk & ice cream) for a short time.  The intestines often can lose the ability to digest lactose when stressed. °Avoid foods that   cause gassiness or bloating.  Typical foods include beans and other legumes, cabbage, broccoli, and dairy foods.  Avoid greasy, spicy, fast foods.  Every person has some sensitivity to other foods, so listen to your body and avoid those foods that trigger problems for you. °Probiotics (such as active yogurt, Align, etc) may help repopulate the intestines and colon with normal bacteria and calm down a sensitive digestive tract °Adding a fiber supplement gradually can help thicken stools by absorbing excess fluid and retrain the intestines to act more normally.  Slowly increase the dose over a few weeks.  Too much fiber too soon can backfire and cause cramping & bloating. °It is okay to try and slow down diarrhea with a few doses of antidiarrheal medicines.   °Bismuth subsalicylate (ex. Kayopectate, Pepto Bismol) for a few doses can help control diarrhea.  Avoid if pregnant.   °Loperamide (Imodium) can slow down diarrhea.  Start with one tablet (2mg) first.  Avoid if you are having fevers or severe pain.  °ILEOSTOMY PATIENTS WILL HAVE CHRONIC DIARRHEA since their colon is not in use.    °Drink plenty of liquids.  You will  need to drink even more glasses of water/liquid a day to avoid getting dehydrated. °Record output from your ileostomy.  Expect to empty the bag every 3-4 hours at first.  Most people with a permanent ileostomy empty their bag 4-6 times at the least.   °Use antidiarrheal medicine (especially Imodium) several times a day to avoid getting dehydrated.  Start with a dose at bedtime & breakfast.  Adjust up or down as needed.  Increase antidiarrheal medications as directed to avoid emptying the bag more than 8 times a day (every 3 hours). °Work with your wound ostomy nurse to learn care for your ostomy.  See ostomy care instructions. °TROUBLESHOOTING IRREGULAR BOWELS °1) Start with a soft & bland diet. No spicy, greasy, or fried foods.  °2) Avoid gluten/wheat or dairy products from diet to see if symptoms improve. °3) Miralax 17gm or flax seed mixed in 8oz. water or juice-daily. May use 2-4 times a day as needed. °4) Gas-X, Phazyme, etc. as needed for gas & bloating.  °5) Prilosec (omeprazole) over-the-counter as needed °6)  Consider probiotics (Align, Activa, etc) to help calm the bowels down ° °Call your doctor if you are getting worse or not getting better.  Sometimes further testing (cultures, endoscopy, X-ray studies, CT scans, bloodwork, etc.) may be needed to help diagnose and treat the cause of the diarrhea. °[facility] °[address], [address], [place], [place]  [address] °[phone] - Main.    °[phone]  - Toll Free.   [phone] - Fax °[email or web address] ° °

## 2016-10-02 NOTE — Progress Notes (Signed)
Call from [place] calling from [facility] to get report; Report given. Rick verbalized understanding;

## 2016-10-03 ENCOUNTER — Encounter: Payer: Self-pay | Admitting: *Deleted

## 2016-10-03 ENCOUNTER — Other Ambulatory Visit: Payer: Self-pay | Admitting: *Deleted

## 2016-10-03 DIAGNOSIS — I10 Essential (primary) hypertension: Secondary | ICD-10-CM

## 2016-10-03 NOTE — Consult Note (Signed)
[facility] Care Management follow up. Chart reviewed. Noted Mr. Riedesel was discharged to [facility] on 10/02/16. Will make [facility] LCSW referral as Mr. Tith was active with [facility] prior to admission. Will notify [facility] NP of Mr. Lollar disposition.   Marthenia Rolling, MSN-Ed, RN,BSN [facility] Liaison [phone]

## 2016-10-04 ENCOUNTER — Ambulatory Visit: Payer: Self-pay | Admitting: *Deleted

## 2016-10-04 ENCOUNTER — Non-Acute Institutional Stay (SKILLED_NURSING_FACILITY): Payer: Medicare Other | Admitting: Adult Health

## 2016-10-04 ENCOUNTER — Encounter: Payer: Self-pay | Admitting: Adult Health

## 2016-10-04 DIAGNOSIS — I25119 Atherosclerotic heart disease of native coronary artery with unspecified angina pectoris: Secondary | ICD-10-CM

## 2016-10-04 DIAGNOSIS — N401 Enlarged prostate with lower urinary tract symptoms: Secondary | ICD-10-CM | POA: Diagnosis not present

## 2016-10-04 DIAGNOSIS — N183 Chronic kidney disease, stage 3 unspecified: Secondary | ICD-10-CM

## 2016-10-04 DIAGNOSIS — E785 Hyperlipidemia, unspecified: Secondary | ICD-10-CM | POA: Diagnosis not present

## 2016-10-04 DIAGNOSIS — I4891 Unspecified atrial fibrillation: Secondary | ICD-10-CM | POA: Diagnosis not present

## 2016-10-04 DIAGNOSIS — C20 Malignant neoplasm of rectum: Secondary | ICD-10-CM

## 2016-10-04 DIAGNOSIS — I255 Ischemic cardiomyopathy: Secondary | ICD-10-CM

## 2016-10-04 DIAGNOSIS — R338 Other retention of urine: Secondary | ICD-10-CM

## 2016-10-04 NOTE — Progress Notes (Signed)
Location:   [facility]    Place of Service:  SNF (31)   CODE STATUS: full code   No Known Allergies  Chief Complaint  Patient presents with  . 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  He is here for short term rehab. He did tell me that he had increased pain yesterday; but is feeling better today. There are no nursing concerns at this time. His goal is to return back home.    Past Medical History:  Diagnosis Date  . Arthritis    "right hand; right knee" (06/19/2014)  . CAD (coronary artery disease)    2v CAD with subtotal occlusion of small co-dominant RCA and borderline lesion in moderate-sized OM-1  . CHF (congestive heart failure) (HCC)    Preserved EF  . Degenerative joint disease of knee, right   . Dysrhythmia   . Enlarged prostate   . Hyperlipidemia   . Hypertension   . Pneumonia 05/2014  . Prediabetes 10/03/2014  . Scrotal edema 04/03/2015  . SMALL BOWEL OBSTRUCTION, HX OF 08/21/2007   Annotation: with narrowing in the ileocecal region Qualifier: Diagnosis of  By: Hassell Done FNP, Tori Milks      Past Surgical History:  Procedure Laterality Date  . CARDIAC CATHETERIZATION N/A 02/16/2016   Procedure: Right/Left Heart Cath and Coronary Angiography;  Surgeon: Jolaine Artist, MD;  Location: [facility];  Service: Cardiovascular;  Laterality: N/A;  . COLONOSCOPY N/A 04/01/2016   Procedure: COLONOSCOPY;  Surgeon: Leighton Ruff, MD;  Location: [facility];  Service: Endoscopy;  Laterality: N/A;  . INGUINAL HERNIA REPAIR Left 1990's  . IR GENERIC HISTORICAL  07/05/2016   IR RADIOLOGIST EVAL & MGMT 07/05/2016 Corrie Mckusick, DO [facility] INTERV RAD  . XI ROBOT ABDOMINAL PERINEAL RESECTION N/A 09/28/2016   Procedure: XI ROBOT ABDOMINOPERINEAL RESECTION WITH PERMANENT COLOSTOMY ERAS PATHWAY;  Surgeon: Michael Boston, MD;  Location: [facility];  Service: General;  Laterality: N/A;    Social History    Social History  . Marital status: Single    Spouse name: N/A  . Number of children: N/A  . Years of education: N/A   Occupational History  . Not on file.   Social History Main Topics  . Smoking status: Former Smoker    Years: 5.00    Types: Pipe, Landscape architect  . Smokeless tobacco: Never Used     Comment: 06/19/2014 "stopped smoking in ~ 2014; used to smoke a pipe or cigar a couple times/month"  . Alcohol use No  . Drug use: No  . Sexual activity: No   Other Topics Concern  . Not on file   Social History Narrative   Single, lives w/his sister of whom he is caregiver      Family History  Problem Relation Age of Onset  . Hypertension Mother   . Diabetes Mother   . Stroke Mother   . Cancer Father 91    Prostate  . Dementia Father   . COPD Sister   . Arthritis Sister   . Edema Sister       VITAL SIGNS BP (!) 144/86   Pulse 88   Temp (!) 96.5 F (35.8 C)   Resp 18   Ht 5\' 9"  (1.753 m)   Wt 206 lb (93.4 kg)   SpO2 96%   BMI 30.42 kg/m   Patient's Medications  New Prescriptions   No medications on file  Previous Medications   ACETAMINOPHEN (TYLENOL) 500 MG TABLET    Take 1,000 mg by mouth every 6 (six) hours as needed (for pain/fever/headaches.).    ASCORBIC ACID (VITAMIN C) 1000 MG TABLET    Take 2,000 mg by mouth 2 (two) times daily.   CARVEDILOL (COREG) 25 MG TABLET    Take 1 tablet (25 mg total) by mouth 2 (two) times daily.   FERROUS SULFATE 325 (65 FE) MG TABLET    take 1 tablet by mouth once daily WITH BREAKFAST   FINASTERIDE (PROSCAR) 5 MG TABLET    take 1 tablet by mouth once daily   LOSARTAN (COZAAR) 50 MG TABLET    Take 50 mg by mouth at bedtime.    LOVASTATIN (MEVACOR) 20 MG TABLET    take 1 tablet by mouth at bedtime   METHOCARBAMOL (ROBAXIN) 750 MG TABLET    Take 1 tablet (750 mg total) by mouth 4 (four) times daily as needed (use for muscle cramps/pain).   METOLAZONE (ZAROXOLYN) 2.5 MG TABLET    Take 1 tablet (2.5 mg total) by mouth daily as needed  (WEIGHT GAIN 3LBS OR MORE IN 24 HOURS). Weight gain   MIRTAZAPINE (REMERON SOL-TAB) 15 MG DISINTEGRATING TABLET    place 1 tablet ON TONGUE at bedtime   MULTIPLE VITAMINS-MINERALS (MULTIVITAMIN WITH MINERALS) TABLET    Take 1 tablet by mouth daily. Men's 50+ Multivitamin   NITROGLYCERIN (NITROSTAT) 0.4 MG SL TABLET    Place 1 tablet (0.4 mg total) under the tongue every 5 (five) minutes as needed for chest pain.   OMEGA-3 ACID ETHYL ESTERS (LOVAZA) 1 G CAPSULE    take 2 capsules by mouth twice a day   OXYCODONE (OXY IR/ROXICODONE) 5 MG IMMEDIATE RELEASE TABLET    Take 1-2 tablets (5-10 mg total) by mouth every 6 (six) hours as needed for moderate pain, severe pain or breakthrough pain.   OXYCODONE-ACETAMINOPHEN (PERCOCET/ROXICET) 5-325 MG TABLET    Take 1 tablet by mouth every 8 (eight) hours as needed for severe pain.   POTASSIUM CHLORIDE SA (K-DUR,KLOR-CON) 20 MEQ TABLET    Take 60 mEq by mouth daily.   TAMSULOSIN (FLOMAX) 0.4 MG CAPS CAPSULE    Take 0.4 mg by mouth daily.    TORSEMIDE (DEMADEX) 20 MG TABLET    Take 80 mg by mouth every morning.   TORSEMIDE (DEMADEX) 20 MG TABLET    Take 40 mg by mouth daily after supper.   TRAZODONE (DESYREL) 100 MG TABLET    Take 300 mg by mouth at bedtime.   Modified Medications   No medications on file  Discontinued Medications   POTASSIUM CHLORIDE SA (K-DUR,KLOR-CON) 20 MEQ TABLET    Take 3 tablets (60 mEq total) by mouth daily. TAKE AN  2 EXTRA TABLETS WHEN TAKING METOLAZONE 2.5 MG AS NEEDED   TORSEMIDE (DEMADEX) 20 MG TABLET    Take 40-80 mg by mouth 2 (two) times daily. 80 mg in the morning  & 40 mg at night     SIGNIFICANT DIAGNOSTIC EXAMS    LABS REVIEWED:   09-29-16: wbc 9.9; hgb 10.0; hct 29.9; mcv 85.7; plt 97; glucose 160; bun 20; creat 1.37; k+ 3.9; na++139; mag 1.6 10-02-16: wbc 8.5; hgb 10.3; hct 31.1; mcv 86.4; plt 97; k+ 3.7    Review of Systems  Constitutional: Negative for malaise/fatigue.  Respiratory: Negative for cough and  shortness of breath.   Cardiovascular: Negative for chest pain, palpitations and leg swelling.  Gastrointestinal: Positive for  abdominal pain. Negative for constipation and heartburn.       New colostomy Pain is managed   Musculoskeletal: Negative for back pain, joint pain and myalgias.  Skin: Negative.   Neurological: Negative for dizziness.  Psychiatric/Behavioral: The patient is not nervous/anxious.     Physical Exam  Constitutional: He is oriented to person, place, and time. No distress.  Obese   Eyes: Conjunctivae are normal.  Neck: Neck supple. No JVD present. No thyromegaly present.  Cardiovascular: Normal rate, regular rhythm and intact distal pulses.   Respiratory: Effort normal and breath sounds normal. No respiratory distress. He has no wheezes.  GI: Soft. Bowel sounds are normal. He exhibits no distension. There is no tenderness.  Has colostomy present with stool in bag   Genitourinary:  Genitourinary Comments: Foley present   Musculoskeletal: He exhibits no edema.  Able to move all extremities   Lymphadenopathy:    He has no cervical adenopathy.  Neurological: He is alert and oriented to person, place, and time.  Skin: Skin is warm and dry. He is not diaphoretic.  Psychiatric: He has a normal mood and affect.     ASSESSMENT/ PLAN:  1. Ischemic cardiomyopathy: has chronic systolic heart failure EF 30-35% (05-20-16): will continue demadex 80 mg in the AM and 40 mg in the PM; with k+ 60 meq daily will continue cozaar 50 mg daily coreg 25 mg twice daily  Will continue zaroxolyn 2.5 mg daily as needed for weight gain >3 pounds in one day   2. CAD: no complaint of chest pain present; will continue prn ntg  3.  Hypertension: will continue coreg 25 mg twice daily cozaar 50 mg daily   4. Dyslipidemia: will continue mevacor 20 mg daily   5. Urine retention has BPH: does have a foley will continue flomax 0.4 mg daily will continue proscar 5 mg daily   6. Anemia: hgb  10.3; will continue iron daily  7. CKD stage III: bun 20; creat 1.37  8. Major depression: will continue trazodone 300 mg nightly remeron 15 mg nightly   9. Adenocarcinoma of rectum status permanent colostomy: will continue therapy as directed will follow up with surgeon. Will contine oxycodone 5 or 10 mg every 6 hours as needed will stop percocet as it is duplicate therapy.    will place on daily weights  Will need to follow up with DR. Gross in 2 weeks Will setup urology appointment for him regarding inability to have foley removed.  Will check cbc; bmp   Time spent with patient  50  minutes >50% time spent counseling; reviewing medical record; tests; labs; and developing future plan of care    MD is aware of resident's narcotic use and is in agreement with current plan of care. We will attempt to wean resident as apropriate   Ok Edwards NP [facility]  Contact [phone] Monday through Friday 8am- 5pm  After hours call [phone]

## 2016-10-05 ENCOUNTER — Ambulatory Visit: Payer: Self-pay | Admitting: *Deleted

## 2016-10-06 ENCOUNTER — Other Ambulatory Visit: Payer: Self-pay

## 2016-10-06 ENCOUNTER — Other Ambulatory Visit: Payer: Self-pay | Admitting: *Deleted

## 2016-10-06 ENCOUNTER — Emergency Department (HOSPITAL_COMMUNITY): Payer: Medicare Other

## 2016-10-06 ENCOUNTER — Encounter (HOSPITAL_COMMUNITY): Payer: Self-pay | Admitting: Emergency Medicine

## 2016-10-06 ENCOUNTER — Emergency Department (HOSPITAL_COMMUNITY): Admit: 2016-10-06 | Payer: Medicare Other

## 2016-10-06 ENCOUNTER — Inpatient Hospital Stay (HOSPITAL_COMMUNITY)
Admission: EM | Admit: 2016-10-06 | Discharge: 2016-10-10 | DRG: 683 | Disposition: A | Discharge status: Skilled Nursing Facility | Payer: Medicare Other | Attending: Internal Medicine | Admitting: Internal Medicine

## 2016-10-06 ENCOUNTER — Inpatient Hospital Stay (HOSPITAL_COMMUNITY): Payer: Medicare Other

## 2016-10-06 ENCOUNTER — Encounter: Payer: Self-pay | Admitting: *Deleted

## 2016-10-06 DIAGNOSIS — B952 Enterococcus as the cause of diseases classified elsewhere: Secondary | ICD-10-CM

## 2016-10-06 DIAGNOSIS — I5042 Chronic combined systolic (congestive) and diastolic (congestive) heart failure: Secondary | ICD-10-CM | POA: Diagnosis present

## 2016-10-06 DIAGNOSIS — I480 Paroxysmal atrial fibrillation: Secondary | ICD-10-CM | POA: Diagnosis present

## 2016-10-06 DIAGNOSIS — N400 Enlarged prostate without lower urinary tract symptoms: Secondary | ICD-10-CM | POA: Diagnosis not present

## 2016-10-06 DIAGNOSIS — I2510 Atherosclerotic heart disease of native coronary artery without angina pectoris: Secondary | ICD-10-CM | POA: Diagnosis not present

## 2016-10-06 DIAGNOSIS — Z7982 Long term (current) use of aspirin: Secondary | ICD-10-CM | POA: Diagnosis not present

## 2016-10-06 DIAGNOSIS — Z792 Long term (current) use of antibiotics: Secondary | ICD-10-CM

## 2016-10-06 DIAGNOSIS — N183 Chronic kidney disease, stage 3 (moderate): Secondary | ICD-10-CM | POA: Diagnosis present

## 2016-10-06 DIAGNOSIS — Z79899 Other long term (current) drug therapy: Secondary | ICD-10-CM

## 2016-10-06 DIAGNOSIS — T451X5S Adverse effect of antineoplastic and immunosuppressive drugs, sequela: Secondary | ICD-10-CM | POA: Diagnosis not present

## 2016-10-06 DIAGNOSIS — E860 Dehydration: Secondary | ICD-10-CM | POA: Diagnosis present

## 2016-10-06 DIAGNOSIS — N189 Chronic kidney disease, unspecified: Secondary | ICD-10-CM | POA: Diagnosis not present

## 2016-10-06 DIAGNOSIS — E875 Hyperkalemia: Secondary | ICD-10-CM | POA: Diagnosis present

## 2016-10-06 DIAGNOSIS — G8929 Other chronic pain: Secondary | ICD-10-CM | POA: Diagnosis not present

## 2016-10-06 DIAGNOSIS — R0602 Shortness of breath: Secondary | ICD-10-CM | POA: Diagnosis not present

## 2016-10-06 DIAGNOSIS — R079 Chest pain, unspecified: Secondary | ICD-10-CM | POA: Diagnosis not present

## 2016-10-06 DIAGNOSIS — E876 Hypokalemia: Secondary | ICD-10-CM | POA: Diagnosis present

## 2016-10-06 DIAGNOSIS — I428 Other cardiomyopathies: Secondary | ICD-10-CM | POA: Diagnosis not present

## 2016-10-06 DIAGNOSIS — I255 Ischemic cardiomyopathy: Secondary | ICD-10-CM | POA: Diagnosis not present

## 2016-10-06 DIAGNOSIS — Z9221 Personal history of antineoplastic chemotherapy: Secondary | ICD-10-CM

## 2016-10-06 DIAGNOSIS — Z923 Personal history of irradiation: Secondary | ICD-10-CM

## 2016-10-06 DIAGNOSIS — Z87891 Personal history of nicotine dependence: Secondary | ICD-10-CM | POA: Diagnosis not present

## 2016-10-06 DIAGNOSIS — R45851 Suicidal ideations: Secondary | ICD-10-CM | POA: Diagnosis present

## 2016-10-06 DIAGNOSIS — I5043 Acute on chronic combined systolic (congestive) and diastolic (congestive) heart failure: Secondary | ICD-10-CM | POA: Diagnosis present

## 2016-10-06 DIAGNOSIS — N390 Urinary tract infection, site not specified: Secondary | ICD-10-CM | POA: Diagnosis present

## 2016-10-06 DIAGNOSIS — Z22322 Carrier or suspected carrier of Methicillin resistant Staphylococcus aureus: Secondary | ICD-10-CM | POA: Diagnosis not present

## 2016-10-06 DIAGNOSIS — D649 Anemia, unspecified: Secondary | ICD-10-CM | POA: Diagnosis present

## 2016-10-06 DIAGNOSIS — I5022 Chronic systolic (congestive) heart failure: Secondary | ICD-10-CM

## 2016-10-06 DIAGNOSIS — N179 Acute kidney failure, unspecified: Principal | ICD-10-CM | POA: Diagnosis present

## 2016-10-06 DIAGNOSIS — I10 Essential (primary) hypertension: Secondary | ICD-10-CM | POA: Diagnosis present

## 2016-10-06 DIAGNOSIS — Z85048 Personal history of other malignant neoplasm of rectum, rectosigmoid junction, and anus: Secondary | ICD-10-CM

## 2016-10-06 DIAGNOSIS — D500 Iron deficiency anemia secondary to blood loss (chronic): Secondary | ICD-10-CM | POA: Diagnosis not present

## 2016-10-06 DIAGNOSIS — C20 Malignant neoplasm of rectum: Secondary | ICD-10-CM | POA: Diagnosis not present

## 2016-10-06 DIAGNOSIS — N19 Unspecified kidney failure: Secondary | ICD-10-CM | POA: Diagnosis not present

## 2016-10-06 DIAGNOSIS — E785 Hyperlipidemia, unspecified: Secondary | ICD-10-CM | POA: Diagnosis not present

## 2016-10-06 DIAGNOSIS — Z933 Colostomy status: Secondary | ICD-10-CM

## 2016-10-06 DIAGNOSIS — Z9049 Acquired absence of other specified parts of digestive tract: Secondary | ICD-10-CM | POA: Diagnosis not present

## 2016-10-06 DIAGNOSIS — M25561 Pain in right knee: Secondary | ICD-10-CM | POA: Diagnosis present

## 2016-10-06 DIAGNOSIS — D6959 Other secondary thrombocytopenia: Secondary | ICD-10-CM | POA: Diagnosis present

## 2016-10-06 DIAGNOSIS — I130 Hypertensive heart and chronic kidney disease with heart failure and stage 1 through stage 4 chronic kidney disease, or unspecified chronic kidney disease: Secondary | ICD-10-CM | POA: Diagnosis not present

## 2016-10-06 DIAGNOSIS — F0630 Mood disorder due to known physiological condition, unspecified: Secondary | ICD-10-CM | POA: Diagnosis not present

## 2016-10-06 DIAGNOSIS — N281 Cyst of kidney, acquired: Secondary | ICD-10-CM | POA: Diagnosis not present

## 2016-10-06 DIAGNOSIS — R338 Other retention of urine: Secondary | ICD-10-CM | POA: Diagnosis present

## 2016-10-06 DIAGNOSIS — R748 Abnormal levels of other serum enzymes: Secondary | ICD-10-CM | POA: Diagnosis not present

## 2016-10-06 DIAGNOSIS — N401 Enlarged prostate with lower urinary tract symptoms: Secondary | ICD-10-CM | POA: Diagnosis not present

## 2016-10-06 DIAGNOSIS — R7881 Bacteremia: Secondary | ICD-10-CM

## 2016-10-06 DIAGNOSIS — M19071 Primary osteoarthritis, right ankle and foot: Secondary | ICD-10-CM | POA: Diagnosis not present

## 2016-10-06 DIAGNOSIS — M79609 Pain in unspecified limb: Secondary | ICD-10-CM | POA: Diagnosis not present

## 2016-10-06 DIAGNOSIS — D696 Thrombocytopenia, unspecified: Secondary | ICD-10-CM | POA: Diagnosis not present

## 2016-10-06 DIAGNOSIS — Z1621 Resistance to vancomycin: Secondary | ICD-10-CM

## 2016-10-06 LAB — COMPREHENSIVE METABOLIC PANEL
ALK PHOS: 65 U/L (ref 38–126)
ALT: 18 U/L (ref 17–63)
ANION GAP: 11 (ref 5–15)
AST: 21 U/L (ref 15–41)
Albumin: 2.8 g/dL — ABNORMAL LOW (ref 3.5–5.0)
BILIRUBIN TOTAL: 1.1 mg/dL (ref 0.3–1.2)
BUN: 69 mg/dL — AB (ref 6–20)
CO2: 22 mmol/L (ref 22–32)
Calcium: 8 mg/dL — ABNORMAL LOW (ref 8.9–10.3)
Chloride: 102 mmol/L (ref 101–111)
Creatinine, Ser: 4.19 mg/dL — ABNORMAL HIGH (ref 0.61–1.24)
GFR calc Af Amer: 15 mL/min — ABNORMAL LOW (ref >60)
GFR calc non Af Amer: 13 mL/min — ABNORMAL LOW (ref >60)
Glucose, Bld: 86 mg/dL (ref 65–99)
POTASSIUM: 5.4 mmol/L — AB (ref 3.5–5.1)
SODIUM: 135 mmol/L (ref 135–145)
Total Protein: 6 g/dL — ABNORMAL LOW (ref 6.5–8.1)

## 2016-10-06 LAB — I-STAT TROPONIN, ED: Troponin i, poc: 0.06 ng/mL (ref 0.00–0.08)

## 2016-10-06 LAB — CBC WITH DIFFERENTIAL/PLATELET
BASOS ABS: 0 10*3/uL (ref 0.0–0.1)
Basophils Relative: 0 %
EOS ABS: 0.1 10*3/uL (ref 0.0–0.7)
Eosinophils Relative: 1 %
HEMATOCRIT: 29 % — AB (ref 39.0–52.0)
HEMOGLOBIN: 9.4 g/dL — AB (ref 13.0–17.0)
LYMPHS ABS: 1.2 10*3/uL (ref 0.7–4.0)
LYMPHS PCT: 13 %
MCH: 27.5 pg (ref 26.0–34.0)
MCHC: 32.4 g/dL (ref 30.0–36.0)
MCV: 84.8 fL (ref 78.0–100.0)
MONO ABS: 0.9 10*3/uL (ref 0.1–1.0)
Monocytes Relative: 10 %
Neutro Abs: 7.5 10*3/uL (ref 1.7–7.7)
Neutrophils Relative %: 77 %
Platelets: 106 10*3/uL — ABNORMAL LOW (ref 150–400)
RBC: 3.42 MIL/uL — ABNORMAL LOW (ref 4.22–5.81)
RDW: 16.9 % — AB (ref 11.5–15.5)
WBC: 9.7 10*3/uL (ref 4.0–10.5)

## 2016-10-06 LAB — BASIC METABOLIC PANEL
BUN: 62 mg/dL — AB (ref 4–21)
Creatinine: 3.9 mg/dL — AB (ref 0.6–1.3)
GLUCOSE: 105 mg/dL
Potassium: 5.7 mmol/L — AB (ref 3.4–5.3)
Sodium: 139 mmol/L (ref 137–147)

## 2016-10-06 LAB — URINALYSIS, ROUTINE W REFLEX MICROSCOPIC
BILIRUBIN URINE: NEGATIVE
GLUCOSE, UA: NEGATIVE mg/dL
KETONES UR: NEGATIVE mg/dL
NITRITE: NEGATIVE
PH: 5 (ref 5.0–8.0)
PROTEIN: 30 mg/dL — AB
Specific Gravity, Urine: 1.009 (ref 1.005–1.030)
Squamous Epithelial / LPF: NONE SEEN

## 2016-10-06 MED ORDER — GI COCKTAIL ~~LOC~~
30.0000 mL | Freq: Once | ORAL | Status: AC
  Administered 2016-10-06: 30 mL via ORAL
  Filled 2016-10-06: qty 30

## 2016-10-06 MED ORDER — MIRTAZAPINE 15 MG PO TBDP
15.0000 mg | ORAL_TABLET | Freq: Every day | ORAL | Status: DC
  Administered 2016-10-07 – 2016-10-09 (×4): 15 mg via ORAL
  Filled 2016-10-06 (×4): qty 1

## 2016-10-06 MED ORDER — DEXTROSE 5 % IV SOLN
1.0000 g | INTRAVENOUS | Status: DC
  Administered 2016-10-07 – 2016-10-08 (×2): 1 g via INTRAVENOUS
  Filled 2016-10-06 (×2): qty 10

## 2016-10-06 MED ORDER — SODIUM CHLORIDE 0.9 % IV BOLUS (SEPSIS)
1000.0000 mL | Freq: Once | INTRAVENOUS | Status: AC
  Administered 2016-10-06: 1000 mL via INTRAVENOUS

## 2016-10-06 MED ORDER — ACETAMINOPHEN 325 MG PO TABS
650.0000 mg | ORAL_TABLET | Freq: Four times a day (QID) | ORAL | Status: DC | PRN
  Administered 2016-10-09 – 2016-10-10 (×2): 650 mg via ORAL
  Filled 2016-10-06 (×2): qty 2

## 2016-10-06 MED ORDER — ONDANSETRON HCL 4 MG/2ML IJ SOLN: 4.0000 mg | Freq: Four times a day (QID) | INTRAMUSCULAR | Status: DC | PRN

## 2016-10-06 MED ORDER — TRAZODONE HCL 100 MG PO TABS
300.0000 mg | ORAL_TABLET | Freq: Every day | ORAL | Status: DC
  Administered 2016-10-07 – 2016-10-09 (×4): 300 mg via ORAL
  Filled 2016-10-06 (×6): qty 3

## 2016-10-06 MED ORDER — DEXTROSE 5 % IV SOLN
2.0000 g | Freq: Once | INTRAVENOUS | Status: AC
  Administered 2016-10-07: 2 g via INTRAVENOUS
  Filled 2016-10-06: qty 2

## 2016-10-06 MED ORDER — OXYCODONE HCL 5 MG PO TABS
5.0000 mg | ORAL_TABLET | ORAL | Status: DC | PRN
  Administered 2016-10-08: 5 mg via ORAL
  Administered 2016-10-08 – 2016-10-09 (×4): 10 mg via ORAL
  Administered 2016-10-10: 5 mg via ORAL
  Filled 2016-10-06: qty 1
  Filled 2016-10-06: qty 2
  Filled 2016-10-06: qty 1
  Filled 2016-10-06 (×3): qty 2

## 2016-10-06 MED ORDER — ONDANSETRON HCL 4 MG PO TABS: 4.0000 mg | ORAL_TABLET | Freq: Four times a day (QID) | ORAL | Status: DC | PRN

## 2016-10-06 MED ORDER — ONDANSETRON HCL 4 MG/2ML IJ SOLN
4.0000 mg | Freq: Once | INTRAMUSCULAR | Status: AC
  Administered 2016-10-06: 4 mg via INTRAVENOUS
  Filled 2016-10-06: qty 2

## 2016-10-06 MED ORDER — NITROGLYCERIN 0.4 MG SL SUBL: 0.4000 mg | SUBLINGUAL_TABLET | SUBLINGUAL | Status: DC | PRN

## 2016-10-06 MED ORDER — SODIUM CHLORIDE 0.9 % IV SOLN
INTRAVENOUS | Status: DC
  Administered 2016-10-06 – 2016-10-09 (×6): via INTRAVENOUS

## 2016-10-06 MED ORDER — IPRATROPIUM-ALBUTEROL 0.5-2.5 (3) MG/3ML IN SOLN: 3.0000 mL | RESPIRATORY_TRACT | Status: DC | PRN

## 2016-10-06 MED ORDER — FINASTERIDE 5 MG PO TABS
5.0000 mg | ORAL_TABLET | Freq: Every day | ORAL | Status: DC
  Administered 2016-10-07 – 2016-10-10 (×4): 5 mg via ORAL
  Filled 2016-10-06 (×4): qty 1

## 2016-10-06 MED ORDER — TAMSULOSIN HCL 0.4 MG PO CAPS
0.4000 mg | ORAL_CAPSULE | Freq: Every day | ORAL | Status: DC
Start: 2016-10-07 — End: 2016-10-10
  Administered 2016-10-07 – 2016-10-10 (×4): 0.4 mg via ORAL
  Filled 2016-10-06 (×4): qty 1

## 2016-10-06 MED ORDER — FENTANYL CITRATE (PF) 100 MCG/2ML IJ SOLN
75.0000 ug | Freq: Once | INTRAMUSCULAR | Status: AC
  Administered 2016-10-06: 75 ug via INTRAVENOUS
  Filled 2016-10-06: qty 2

## 2016-10-06 MED ORDER — ACETAMINOPHEN 650 MG RE SUPP: 650.0000 mg | Freq: Four times a day (QID) | RECTAL | Status: DC | PRN

## 2016-10-06 MED ORDER — PRAVASTATIN SODIUM 20 MG PO TABS
20.0000 mg | ORAL_TABLET | Freq: Every day | ORAL | Status: DC
  Administered 2016-10-07 – 2016-10-09 (×3): 20 mg via ORAL
  Filled 2016-10-06 (×3): qty 1

## 2016-10-06 MED ORDER — SODIUM POLYSTYRENE SULFONATE 15 GM/60ML PO SUSP
30.0000 g | Freq: Once | ORAL | Status: AC
  Administered 2016-10-06: 30 g via ORAL
  Filled 2016-10-06: qty 120

## 2016-10-06 MED ORDER — METHOCARBAMOL 500 MG PO TABS
750.0000 mg | ORAL_TABLET | Freq: Four times a day (QID) | ORAL | Status: DC | PRN
  Administered 2016-10-08: 750 mg via ORAL
  Filled 2016-10-06: qty 2

## 2016-10-06 MED ORDER — HEPARIN SODIUM (PORCINE) 5000 UNIT/ML IJ SOLN
5000.0000 [IU] | Freq: Three times a day (TID) | INTRAMUSCULAR | Status: DC
  Administered 2016-10-06 – 2016-10-10 (×11): 5000 [IU] via SUBCUTANEOUS
  Filled 2016-10-06 (×11): qty 1

## 2016-10-06 NOTE — Progress Notes (Signed)
Pharmacy Antibiotic Note  Patrick [name] is a 66 y.o. male admitted on 10/06/2016 with UTI.  Pharmacy has been consulted for Ceftriaxone dosing. WBC WNL. Noted elevated Scr.   Plan: -Ceftriaxone 1g IV q24h -F/U urine culture for directed therapy  Temp (24hrs), Avg:98.2 F (36.8 C), Min:98.2 F (36.8 C), Max:98.2 F (36.8 C)   Recent Labs Lab 09/30/16 0556 10/01/16 0533 10/02/16 0432 10/06/16 1819  WBC 10.4 8.3 8.5 9.7  CREATININE 1.16 1.30* 1.28* 4.19*    Estimated Creatinine Clearance: 19 mL/min (A) (by C-G formula based on SCr of 4.19 mg/dL (H)).    No Known Allergies   Narda Bonds 10/06/2016 11:51 PM

## 2016-10-06 NOTE — Patient Outreach (Signed)
Patrick [name] [facility]) Care Management  10/06/2016  Patrick [name] 1950/07/30 [id number]   CSW was able to make contact with patient today to perform the initial assessment, as well as assess and assist with social work needs and services, when CSW met with patient at [name] [name] where patient currently resides to receive short-term rehabilitative services.  CSW did not need to introduce self, as patient is already quite familiar with CSW and services provided through [name].  Also present during the visit was patient's Geriatric Nurse Practitioner, Patrick [name]. CSW obtained two HIPAA compliant identifiers from patient, which included patient's name and date of birth. Patient appeared to be in a great deal of pain today, not only at his surgical site, but also in his feet.  Ms. Patrick [name] was able to assess patient's surgical site and reported that it looked healthy, healing nicely and that the bandages and dressing were properly intact.  However, Patrick [name] was quite concerned about the pain, swelling, heat and inflammation in patient's legs and feet, especially on the left side.  Ms. Patrick [name] spoke with patient's nurse, requesting a physician consult, as she believes patient is suffering from Gout and needs to be prescribed prednisone. Patient was unable to articulate how to change and clean out his colostomy bag, appearing to be heavily medicated at the time of the visit.  Patient reported that he does not believe his pain is being well-controlled, but he was quite confused and repeating the same words and sentences. Patient was unable to answer questions appropriately, nor was he able to stay focused.  Patient indicated that he is scheduled for discharge on Tuesday, May 22nd, but admits that there is no way that he can go home and take care of himself independently.  Patient is also concerned  about his sister, Patrick [name] going home, as she is scheduled for discharge on Thursday, May 24th.  Patient reported that there is no way that he will be able to take care of himself, let alone Patrick [name]. CSW was able to wheel Patrick [name] into patient's room, after speaking with them individually at length, to discuss long-term care placement arrangements for both of them.  Patient and Patrick [name] were both agreeable to remaining at [name] or going into another long-term care facility, to continue to receive rehabilitative services, at least for the next three months.  CSW agreed to speak with the social worker on staff at [name], as well as a representative from the business office, to see if them remaining at [name] is even an option.  If not, CSW will enlist the help of the social worker to have patient and Patrick [name] moved to another facility. Patient has not even gotten out of bed since his surgery and Patrick [name] is still unable to ambulate 20 feet without maximum assistance.  Patrick [name] is agreeable to continuing with therapies (both physical and occupational) to regain her strength and mobility, as well as try to work toward caring for herself independently, realizing that she will now need to take care of patient, as he is incapable of caring for himself at present.  CSW will follow-up with patient and Ms. Lanigan in one week to report findings. Patrick [name], BSW, MSW, LCSW  Licensed Education officer, [facility]  Mailing [place] [address] [place], [place] [address] Physical Address-[address], [place], [place] [address]  Toll Free Main # [phone] Fax # [phone] Cell # [phone]  Office # [phone] [email or web address]

## 2016-10-06 NOTE — ED Triage Notes (Signed)
Pt arrives from Nursing home for pain all over and change in appearance of colostomy. 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 Pt is alert and ox4.

## 2016-10-06 NOTE — ED Notes (Signed)
Report attempted x 1

## 2016-10-06 NOTE — H&P (Signed)
History and Physical    Patrick [name] VVO:[id number] DOB: January 22, 1951 DOA: 10/06/2016  Referring MD/NP/PA: Dr. Leonette Monarch PCP: Sela Hua, MD  Patient coming from: SNF  Chief Complaint: Right knee pain  HPI: Patrick [name] is a 66 y.o. male with medical history significant of adenocarcinoma rectal cancer(s/p resection with permanent colostomy on 09/28/2016, chemo, and radiation), CAD, and CHF last LVEF 30-35% with grade 2 dFx; who presents with complaints of right knee pain. He had been at a skilled nursing facility following his recent colon cancer resection surgery. Patient states that he's had acute worsening of his right knee pain that he describes as sharp and stabbing in nature. He reports symptoms are not new and have intermittently been in the left leg as well. Normally patient able to ambulate without assistance, but has recently required help just to stand. With sitting up he reports diffuse lower abdominal pain. A Foley catheter at the nursing facility that he had been in place for 1 week due to urinary retention. Associated symptoms include subjective fever, shortness of breath, dark and stools (patient on iron supplementation), decreased appetite 3 days, nausea, and reports of suicidal ideations. Denies any vomiting, chest pain, palpitation, dysuria, or focal weakness.   ED Course: Patient was evaluated for right knee pain uncontrolled, but found to have elevated creatinine at 4.19 (previously 1.2) and BUN 69. Patient's potassium was also seen to be elevated at 5.4 and patient was given 30g of Kayexalate with 1 L IV fluids. Patient has Foley in place draining urine. X-rays reveal cardiomegaly without pulmonary edema, mild right knee osteoarthritis, and degenerative changes in the right great toe. Urinalysis was abnormal with bacteria and hematuria. During nurse's initial evaluation patient noted to have suicidal ideations, but denied any active plan of suicide.  Review of Systems: As per HPI  otherwise 10 point review of systems negative.   Past Medical History:  Diagnosis Date  . Arthritis    "right hand; right knee" (06/19/2014)  . CAD (coronary artery disease)    2v CAD with subtotal occlusion of small co-dominant RCA and borderline lesion in moderate-sized OM-1  . CHF (congestive heart failure) (HCC)    Preserved EF  . Coronary artery disease involving native coronary artery of native heart with angina pectoris ([place]) 09/11/2016  . Degenerative joint disease of knee, right   . Dysrhythmia   . Enlarged prostate   . Hyperlipidemia   . Hypertension   . Pneumonia 05/2014  . Prediabetes 10/03/2014  . Scrotal edema 04/03/2015  . SMALL BOWEL OBSTRUCTION, HX OF 08/21/2007   Annotation: with narrowing in the ileocecal region Qualifier: Diagnosis of  By: Hassell Done FNP, Tori Milks      Past Surgical History:  Procedure Laterality Date  . CARDIAC CATHETERIZATION N/A 02/16/2016   Procedure: Right/Left Heart Cath and Coronary Angiography;  Surgeon: Jolaine Artist, MD;  Location: [place] [facility];  Service: Cardiovascular;  Laterality: N/A;  . COLONOSCOPY N/A 04/01/2016   Procedure: COLONOSCOPY;  Surgeon: Leighton Ruff, MD;  Location: [facility] ENDOSCOPY;  Service: Endoscopy;  Laterality: N/A;  . INGUINAL HERNIA REPAIR Left 1990's  . IR GENERIC HISTORICAL  07/05/2016   IR RADIOLOGIST EVAL & MGMT 07/05/2016 Corrie Mckusick, DO [facility] INTERV RAD  . XI ROBOT ABDOMINAL PERINEAL RESECTION N/A 09/28/2016   Procedure: XI ROBOT ABDOMINOPERINEAL RESECTION WITH PERMANENT COLOSTOMY ERAS PATHWAY;  Surgeon: Michael Boston, MD;  Location: [facility] ORS;  Service: General;  Laterality: N/A;     reports that he has quit  smoking. His smoking use included Pipe and Cigars. He quit after 5.00 years of use. He has never used smokeless tobacco. He reports that he does not drink alcohol or use drugs.  No Known Allergies  Family History  Problem Relation Age of Onset  . Hypertension Mother   . Diabetes Mother   . Stroke Mother     . Cancer Father 37       Prostate  . Dementia Father   . COPD Sister   . Arthritis Sister   . Edema Sister     Prior to Admission medications   Medication Sig Start Date End Date Taking? Authorizing Provider  acetaminophen (TYLENOL) 500 MG tablet Take 1,000 mg by mouth every 6 (six) hours as needed (for pain/fever/headaches.).     [provider]  Ascorbic Acid (VITAMIN C) 1000 MG tablet Take 2,000 mg by mouth 2 (two) times daily.    [provider]  carvedilol (COREG) 25 MG tablet Take 1 tablet (25 mg total) by mouth 2 (two) times daily. 12/08/15   Minus Breeding, MD  ferrous sulfate 325 (65 FE) MG tablet take 1 tablet by mouth once daily WITH BREAKFAST 08/11/15   Kinnie Feil, MD  finasteride (PROSCAR) 5 MG tablet take 1 tablet by mouth once daily 05/18/16   Mayo, Pete Pelt, MD  losartan (COZAAR) 50 MG tablet Take 50 mg by mouth at bedtime.     [provider]  lovastatin (MEVACOR) 20 MG tablet take 1 tablet by mouth at bedtime 05/25/16   Mayo, Pete Pelt, MD  methocarbamol (ROBAXIN) 750 MG tablet Take 1 tablet (750 mg total) by mouth 4 (four) times daily as needed (use for muscle cramps/pain). 09/28/16   Michael Boston, MD  metolazone (ZAROXOLYN) 2.5 MG tablet Take 1 tablet (2.5 mg total) by mouth daily as needed (WEIGHT GAIN 3LBS OR MORE IN 24 HOURS). Weight gain 07/25/16   Barrett, Evelene Croon, PA-C  mirtazapine (REMERON SOL-TAB) 15 MG disintegrating tablet place 1 tablet ON TONGUE at bedtime 07/27/16   Nicolette Bang, DO  Multiple Vitamins-Minerals (MULTIVITAMIN WITH MINERALS) tablet Take 1 tablet by mouth daily. Men's 50+ Multivitamin    [provider]  nitroGLYCERIN (NITROSTAT) 0.4 MG SL tablet Place 1 tablet (0.4 mg total) under the tongue every 5 (five) minutes as needed for chest pain. 04/29/16 08/04/17  Barrett, Evelene Croon, PA-C  omega-3 acid ethyl esters (LOVAZA) 1 g capsule take 2 capsules by mouth twice a day 08/30/16   Mayo, Pete Pelt, MD   oxyCODONE (OXY IR/ROXICODONE) 5 MG immediate release tablet Take 1-2 tablets (5-10 mg total) by mouth every 6 (six) hours as needed for moderate pain, severe pain or breakthrough pain. 09/28/16   Michael Boston, MD  oxyCODONE-acetaminophen (PERCOCET/ROXICET) 5-325 MG tablet Take 1 tablet by mouth every 8 (eight) hours as needed for severe pain. 07/08/16   Mayo, Pete Pelt, MD  potassium chloride SA (K-DUR,KLOR-CON) 20 MEQ tablet Take 60 mEq by mouth daily.    [provider]  tamsulosin (FLOMAX) 0.4 MG CAPS capsule Take 0.4 mg by mouth daily.     [provider]  torsemide (DEMADEX) 20 MG tablet Take 80 mg by mouth every morning.    [provider]  torsemide (DEMADEX) 20 MG tablet Take 40 mg by mouth daily after supper.    [provider]  traZODone (DESYREL) 100 MG tablet Take 300 mg by mouth at bedtime.  05/19/16   [provider]  Physical Exam:  Constitutional: Elderly male in NAD, calm, comfortable Vitals:   10/06/16 2000 10/06/16 2015 10/06/16 2030 10/06/16 2045  BP: (!) 106/58 (!) 90/47 114/66 119/66  Pulse: 64 66 67   Resp: 20 (!) 28 (!) 24 13  Temp:      TempSrc:      SpO2: 100% 99% 100%    Eyes: PERRL, lids and conjunctivae normal ENMT: Mucous membranes are moist. Posterior pharynx clear of any exudate or lesions. Neck: normal, supple, no masses, no thyromegaly Respiratory: clear to auscultation bilaterally, no wheezing, no crackles. Normal respiratory effort. No accessory muscle use.  Cardiovascular: Irregular irregular, no murmurs / rubs / gallops. No extremity edema. 2+ pedal pulses. No carotid bruits.  Abdomen: Lower abdominal tenderness with sitting upright, no masses palpated. No hepatosplenomegaly. Bowel sounds positive. Foley catheter in place. Musculoskeletal: no clubbing / cyanosis. No joint deformity upper and lower extremities. Good ROM, no contractures. Normal muscle tone.  Skin: no rashes, lesions, ulcers. No  induration Neurologic: CN 2-12 grossly intact. Sensation intact, DTR normal. Strength 5/5 in all 4.  Psychiatric:  Alert and oriented x 3. Flat affect    Labs on Admission: I have personally reviewed following labs and imaging studies  CBC:  Recent Labs Lab 09/30/16 0556 10/01/16 0533 10/02/16 0432 10/06/16 1819  WBC 10.4 8.3 8.5 9.7  NEUTROABS  --   --   --  7.5  HGB 9.8* 9.9* 10.3* 9.4*  HCT 29.2* 29.7* 31.1* 29.0*  MCV 86.9 86.8 86.4 84.8  PLT 89* 79* 97* 782*   Basic Metabolic Panel:  Recent Labs Lab 09/30/16 0556 10/01/16 0533 10/02/16 0432 10/06/16 1819  NA  --   --   --  135  K 3.7 3.8 3.7 5.4*  CL  --   --   --  102  CO2  --   --   --  22  GLUCOSE  --   --   --  86  BUN  --   --   --  69*  CREATININE 1.16 1.30* 1.28* 4.19*  CALCIUM  --   --   --  8.0*  MG 2.5*  --   --   --    GFR: Estimated Creatinine Clearance: 19 mL/min (A) (by C-G formula based on SCr of 4.19 mg/dL (H)). Liver Function Tests:  Recent Labs Lab 10/06/16 1819  AST 21  ALT 18  ALKPHOS 65  BILITOT 1.1  PROT 6.0*  ALBUMIN 2.8*   No results for input(s): LIPASE, AMYLASE in the last 168 hours. No results for input(s): AMMONIA in the last 168 hours. Coagulation Profile: No results for input(s): INR, PROTIME in the last 168 hours. Cardiac Enzymes: No results for input(s): CKTOTAL, CKMB, CKMBINDEX, TROPONINI in the last 168 hours. BNP (last 3 results) No results for input(s): PROBNP in the last 8760 hours. HbA1C: No results for input(s): HGBA1C in the last 72 hours. CBG: No results for input(s): GLUCAP in the last 168 hours. Lipid Profile: No results for input(s): CHOL, HDL, LDLCALC, TRIG, CHOLHDL, LDLDIRECT in the last 72 hours. Thyroid Function Tests: No results for input(s): TSH, T4TOTAL, FREET4, T3FREE, THYROIDAB in the last 72 hours. Anemia Panel: No results for input(s): VITAMINB12, FOLATE, FERRITIN, TIBC, IRON, RETICCTPCT in the last 72 hours. Urine analysis:     Component Value Date/Time   COLORURINE YELLOW 06/04/2016 [place] 06/04/2016 1152   LABSPEC 1.015 06/04/2016 1152   PHURINE 5.0 06/04/2016 1152   GLUCOSEU  NEGATIVE 06/04/2016 1152   HGBUR SMALL (A) 06/04/2016 1152   HGBUR negative 12/26/2008 1541   BILIRUBINUR NEGATIVE 06/04/2016 1152   KETONESUR NEGATIVE 06/04/2016 1152   PROTEINUR NEGATIVE 06/04/2016 1152   UROBILINOGEN 1.0 04/03/2015 1725   NITRITE NEGATIVE 06/04/2016 1152   LEUKOCYTESUR PRESENT (A) 06/04/2016 1152   Sepsis Labs: Recent Results (from the past 240 hour(s))  MRSA PCR Screening     Status: Abnormal   Collection Time: 09/28/16  4:06 PM  Result Value Ref Range Status   MRSA by PCR POSITIVE (A) NEGATIVE Final    Comment:        The GeneXpert MRSA Assay (FDA approved for NASAL specimens only), is one component of a comprehensive MRSA colonization surveillance program. It is not intended to diagnose MRSA infection nor to guide or monitor treatment for MRSA infections. RESULT CALLED TO, READ BACK BY AND VERIFIED WITH: S.ODOM RN AT 1931 ON 09/28/16 BY S.VANHOORNE      Radiological Exams on Admission: Dg Chest 2 View  Result Date: 10/06/2016 CLINICAL DATA:  Chest pain. EXAM: CHEST  2 VIEW COMPARISON:  Abdominal radiograph 05/29/2016 FINDINGS: Cardiomegaly, with prominence of the epicardial fat pad. No evidence pulmonary edema. Mild central bronchial thickening. No consolidation, pleural effusion or pneumothorax. No acute osseous abnormalities. IMPRESSION: Cardiomegaly without acute abnormality. Electronically Signed   By: Jeb Levering M.D.   On: 10/06/2016 21:04   Dg Knee 2 Views Right  Result Date: 10/06/2016 CLINICAL DATA:  Knee pain EXAM: RIGHT KNEE - 1-2 VIEW COMPARISON:  None. FINDINGS: The bones are osteopenic. There is medial compartment predominant narrowing of the femorotibial joint space, though this may be exaggerated by positioning. No fracture or dislocation. Narrowing of the  patellofemoral joint space with superior patellar enthesophyte. IMPRESSION: Mild right knee osteoarthrosis without acute osseous abnormality. Electronically Signed   By: Ulyses Jarred M.D.   On: 10/06/2016 19:47   Dg Foot 2 Views Right  Result Date: 10/06/2016 CLINICAL DATA:  Right foot pain EXAM: RIGHT FOOT - 2 VIEW COMPARISON:  None. FINDINGS: No fracture. No subluxation or dislocation. Degenerative changes noted in the great toe MTP joint with associated hallux valgus deformity. IMPRESSION: Degenerative changes in the great toe.  No acute bony abnormality. Electronically Signed   By: Misty Stanley M.D.   On: 10/06/2016 19:41    EKG: Independently reviewed. Atrial fibrillation  Assessment/Plan Acute renal failure on chronic kidney disease: Baseline creatinine had previously been 1.2-1.5, but presents with a creatinine of 4.19 with BUN 69. - Admit to a telemetry bed - Check renal ultrasound - FeUr given patient on torsemide  - Gentle IV fluids given history of CHF - Avoid nephrotoxic agents  Urinary tract infection: Abnormal UA with hematuria and bacteriuria.  Patient came from outside facility with Foley catheter in place. - f/u urine culture - Rocephin IV  Right knee pain: Acute on chronic. Patient notes intermittent pain in the opposite knee as well. X-rays only show marked osteoarthritis - Follow-up vascular Doppler ultrasound  Hyperkalemia: Initial potassium 5.4 on admission. Patient given 30 g of Kayexalate on the ED. No significant EKG changes noted. - IVF - Recheck BMP   Paroxysmal Atrial fibrillation: Patient with previous history of atrial fibrillation noted in the past. - Restart Coreg when blood pressure stable - Patient likely not good anticoagulation candidate given recent surgery.  Combined systolic and diastolic CHF: Chest x-ray shows cardiomegaly without overt edema. - Hold Torsemide and metolazone due to renal failure - Check I&O's  Rectal cancer s/p  colostomy - Check Hemoccult   Normocytic normochromic anemia: Stable. Hemoglobin 9.4 on admission. Baseline hemoglobin between 9-10. This could signify hemoconcentration setting of dehydration. - Repeat CBC in a.m.  Essential hypertension: Patient with borderline hypotension on admission with blood pressures 90/47. Blood pressures responded to IV fluids. - Hold blood pressure medications losartan and Coreg restart when blood pressure stabilizes  Suicidal ideations Patient reports days of thoughts of wanting to hurt himself, but denies having active plan. - Sitter to bedside - may need psych eval was medically stable   BPH and H/O urinary retention - Continue Foley catheter - Continue finasteride and Flomax  Thrombocytopenia: initial platelet count 106 Patient reports dark stools but currently on iron supplementation.  - Recheck CBC in a.m.    DVT prophylaxis: Heparin   Code Status: Full famiy Communication:  no family present at bedside  Disposition Plan: SNF Consults called: None  Admission status: Inpatient  Norval Morton MD [facility] Pager [phone]  If 7PM-7AM, please contact night-coverage [email or web address] Password [facility]  10/06/2016, 9:23 PM

## 2016-10-06 NOTE — ED Notes (Signed)
Patient transported to Ultrasound 

## 2016-10-06 NOTE — ED Provider Notes (Signed)
[facility] DEPT Provider Note   CSN: [id number] Arrival date & time: 10/06/16  1644     History   Chief Complaint Chief Complaint  Patient presents with  . Knee Pain    HPI Patrick [name] is a 66 y.o. male.  HPI 66 year old gentleman presents with right knee and right heel pain that began yesterday. Patient reports that this pain is chronic in nature and migrates from left to right side intermittently. The patient is currently living in a skilled is a facility where he gets Tylenol and Percocets for the pain which she reports is not helping. Pain is exacerbated with movement of the extremities and palpation. He denies any trauma, redness, fevers.  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    Past Medical History:  Diagnosis Date  . Arthritis    "right hand; right knee" (06/19/2014)  . CAD (coronary artery disease)    2v CAD with subtotal occlusion of small co-dominant RCA and borderline lesion in moderate-sized OM-1  . CHF (congestive heart failure) (HCC)    Preserved EF  . Coronary artery disease involving native coronary artery of native heart with angina pectoris ([place]) 09/11/2016  . Degenerative joint disease of knee, right   . Dysrhythmia   . Enlarged prostate   . Hyperlipidemia   . Hypertension   . Pneumonia 05/2014  . Prediabetes 10/03/2014  . Scrotal edema 04/03/2015  . SMALL BOWEL OBSTRUCTION, HX OF 08/21/2007   Annotation: with narrowing in the ileocecal region Qualifier: Diagnosis of  By: Hassell Done FNP, Tori Milks      Patient Active Problem List   Diagnosis Date Noted  . Hypomagnesemia 09/29/2016  . History of resection of rectum 09/28/2016  . Rectal cancer ([place]) 09/28/2016  . Atrial fibrillation ([place]) 09/11/2016  . Coronary artery disease involving native coronary artery of native heart with angina pectoris ([place]) 09/11/2016  . Nonischemic cardiomyopathy ([place]) 09/11/2016  . Acute blood  loss anemia 05/29/2016  . Thrombocytopenia ([place]) 05/29/2016  . Iron deficiency anemia due to chronic blood loss 04/15/2016  . Adenocarcinoma of rectum s/p robotic APR/colostomy 09/28/2016 04/14/2016  . Chronic kidney disease (CKD), stage III (moderate) 02/05/2016  . Abnormal finding on cardiovascular stress test 02/05/2016  . Cardiomyopathy, ischemic 06/17/2015  . Chronic systolic heart failure ([place]) 06/17/2015  . Major depressive disorder, recurrent episode ([place]) 04/17/2015  . Other iron deficiency anemias   . Health care maintenance 03/11/2014  . Obesity, unspecified 10/03/2013  . Dyslipidemia 08/13/2013  . CARDIAC MURMUR 01/26/2010  . MACULAR DEGENERATION 05/02/2008  . ERECTILE DYSFUNCTION 03/13/2007  . Essential hypertension 01/16/2007  . BPH (benign prostatic hyperplasia) 01/16/2007    Past Surgical History:  Procedure Laterality Date  . CARDIAC CATHETERIZATION N/A 02/16/2016   Procedure: Right/Left Heart Cath and Coronary Angiography;  Surgeon: Jolaine Artist, MD;  Location: [facility];  Service: Cardiovascular;  Laterality: N/A;  . COLONOSCOPY N/A 04/01/2016   Procedure: COLONOSCOPY;  Surgeon: Leighton Ruff, MD;  Location: [facility];  Service: Endoscopy;  Laterality: N/A;  . INGUINAL HERNIA REPAIR Left 1990's  . IR GENERIC HISTORICAL  07/05/2016   IR RADIOLOGIST EVAL & MGMT 07/05/2016 Corrie Mckusick, DO [facility] INTERV RAD  . XI ROBOT ABDOMINAL PERINEAL RESECTION N/A 09/28/2016   Procedure: XI ROBOT ABDOMINOPERINEAL RESECTION WITH PERMANENT COLOSTOMY ERAS PATHWAY;  Surgeon: Michael Boston, MD;  Location: [facility] ORS;  Service: General;  Laterality: N/A;  Home Medications    Prior to Admission medications   Medication Sig Start Date End Date Taking? Authorizing Provider  acetaminophen (TYLENOL) 500 MG tablet Take 1,000 mg by mouth every 6 (six) hours as needed (for pain/fever/headaches.).     [provider]  Ascorbic Acid (VITAMIN C) 1000 MG tablet Take 2,000  mg by mouth 2 (two) times daily.    [provider]  carvedilol (COREG) 25 MG tablet Take 1 tablet (25 mg total) by mouth 2 (two) times daily. 12/08/15   Minus Breeding, MD  ferrous sulfate 325 (65 FE) MG tablet take 1 tablet by mouth once daily WITH BREAKFAST 08/11/15   Kinnie Feil, MD  finasteride (PROSCAR) 5 MG tablet take 1 tablet by mouth once daily 05/18/16   Mayo, Pete Pelt, MD  losartan (COZAAR) 50 MG tablet Take 50 mg by mouth at bedtime.     [provider]  lovastatin (MEVACOR) 20 MG tablet take 1 tablet by mouth at bedtime 05/25/16   Mayo, Pete Pelt, MD  methocarbamol (ROBAXIN) 750 MG tablet Take 1 tablet (750 mg total) by mouth 4 (four) times daily as needed (use for muscle cramps/pain). 09/28/16   Michael Boston, MD  metolazone (ZAROXOLYN) 2.5 MG tablet Take 1 tablet (2.5 mg total) by mouth daily as needed (WEIGHT GAIN 3LBS OR MORE IN 24 HOURS). Weight gain 07/25/16   Barrett, Evelene Croon, PA-C  mirtazapine (REMERON SOL-TAB) 15 MG disintegrating tablet place 1 tablet ON TONGUE at bedtime 07/27/16   Nicolette Bang, DO  Multiple Vitamins-Minerals (MULTIVITAMIN WITH MINERALS) tablet Take 1 tablet by mouth daily. Men's 50+ Multivitamin    [provider]  nitroGLYCERIN (NITROSTAT) 0.4 MG SL tablet Place 1 tablet (0.4 mg total) under the tongue every 5 (five) minutes as needed for chest pain. 04/29/16 08/04/17  Barrett, Evelene Croon, PA-C  omega-3 acid ethyl esters (LOVAZA) 1 g capsule take 2 capsules by mouth twice a day 08/30/16   Mayo, Pete Pelt, MD  oxyCODONE (OXY IR/ROXICODONE) 5 MG immediate release tablet Take 1-2 tablets (5-10 mg total) by mouth every 6 (six) hours as needed for moderate pain, severe pain or breakthrough pain. 09/28/16   Michael Boston, MD  oxyCODONE-acetaminophen (PERCOCET/ROXICET) 5-325 MG tablet Take 1 tablet by mouth every 8 (eight) hours as needed for severe pain. 07/08/16   Mayo, Pete Pelt, MD  potassium chloride SA (K-DUR,KLOR-CON) 20 MEQ  tablet Take 60 mEq by mouth daily.    [provider]  tamsulosin (FLOMAX) 0.4 MG CAPS capsule Take 0.4 mg by mouth daily.     [provider]  torsemide (DEMADEX) 20 MG tablet Take 80 mg by mouth every morning.    [provider]  torsemide (DEMADEX) 20 MG tablet Take 40 mg by mouth daily after supper.    [provider]  traZODone (DESYREL) 100 MG tablet Take 300 mg by mouth at bedtime.  05/19/16   [provider]    Family History Family History  Problem Relation Age of Onset  . Hypertension Mother   . Diabetes Mother   . Stroke Mother   . Cancer Father 91       Prostate  . Dementia Father   . COPD Sister   . Arthritis Sister   . Edema Sister     Social History Social History  Substance Use Topics  . Smoking status: Former Smoker    Years: 5.00    Types: Pipe, Landscape architect  . Smokeless tobacco: Never Used  Comment: 06/19/2014 "stopped smoking in ~ 2014; used to smoke a pipe or cigar a couple times/month"  . Alcohol use No     Allergies   Patient has no known allergies.   Review of Systems Review of Systems All other systems are reviewed and are negative for acute change except as noted in the HPI   Physical Exam Updated Vital Signs BP (!) 100/54 (BP Location: Right Arm)   Pulse 66   Temp 98.2 F (36.8 C) (Oral)   Resp 18   SpO2 95%   Physical Exam  Constitutional: He is oriented to person, place, and time. He appears well-developed and well-nourished. No distress.  HENT:  Head: Normocephalic and atraumatic.  Nose: Nose normal.  Eyes: Conjunctivae and EOM are normal. Pupils are equal, round, and reactive to light. Right eye exhibits no discharge. Left eye exhibits no discharge. No scleral icterus.  Neck: Normal range of motion. Neck supple.  Cardiovascular: Normal rate and regular rhythm.  Exam reveals no gallop and no friction rub.   No murmur heard. Pulmonary/Chest: Effort normal and breath sounds normal. No  stridor. No respiratory distress. He has no rales.  Abdominal: Soft. He exhibits no distension. There is no tenderness. There is no rigidity, no rebound and no guarding.    Musculoskeletal: He exhibits no edema.       Right knee: He exhibits no effusion, no deformity and no erythema. Tenderness found. Medial joint line tenderness noted.       Right foot: There is tenderness. There is no swelling and no deformity.  Neurological: He is alert and oriented to person, place, and time.  Skin: Skin is warm and dry. No rash noted. He is not diaphoretic. No erythema.  Psychiatric: He has a normal mood and affect.  Vitals reviewed.    ED Treatments / Results  Labs (all labs ordered are listed, but only abnormal results are displayed) Labs Reviewed  CBC WITH DIFFERENTIAL/PLATELET - Abnormal; Notable for the following:       Result Value   RBC 3.42 (*)    Hemoglobin 9.4 (*)    HCT 29.0 (*)    RDW 16.9 (*)    Platelets 106 (*)    All other components within normal limits  COMPREHENSIVE METABOLIC PANEL - Abnormal; Notable for the following:    Potassium 5.4 (*)    BUN 69 (*)    Creatinine, Ser 4.19 (*)    Calcium 8.0 (*)    Total Protein 6.0 (*)    Albumin 2.8 (*)    GFR calc non Af Amer 13 (*)    GFR calc Af Amer 15 (*)    All other components within normal limits  URINALYSIS, ROUTINE W REFLEX MICROSCOPIC - Abnormal; Notable for the following:    APPearance CLOUDY (*)    Hgb urine dipstick SMALL (*)    Protein, ur 30 (*)    Leukocytes, UA LARGE (*)    Bacteria, UA MANY (*)    All other components within normal limits  URINE CULTURE  CBC  BASIC METABOLIC PANEL  UREA NITROGEN, URINE  CREATININE, URINE, RANDOM  I-STAT TROPOININ, ED    EKG  EKG Interpretation  Date/Time:  Thursday Oct 06 2016 20:58:10 EDT Ventricular Rate:  61 PR Interval:    QRS Duration: 168 QT Interval:  496 QTC Calculation: 500 R Axis:   -107 Text Interpretation:  Atrial fibrillation, new since 2002  (pt recently Dx'd with a fib though) 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 EXAM: CHEST  2 VIEW COMPARISON:  Abdominal radiograph 05/29/2016 FINDINGS: Cardiomegaly, with prominence of the epicardial fat pad. No evidence pulmonary edema. Mild central bronchial thickening. No consolidation, pleural effusion or pneumothorax. No acute osseous abnormalities. IMPRESSION: Cardiomegaly without acute abnormality. Electronically Signed   By: Jeb Levering M.D.   On: 10/06/2016 21:04   Dg Knee 2 Views Right  Result Date: 10/06/2016 CLINICAL DATA:  Knee pain EXAM: RIGHT KNEE - 1-2 VIEW COMPARISON:  None. FINDINGS: The bones are osteopenic. There is medial compartment predominant narrowing of the femorotibial joint space, though this may be exaggerated by positioning. No fracture or dislocation. Narrowing of the patellofemoral joint space with superior patellar enthesophyte. IMPRESSION: Mild right knee osteoarthrosis without acute osseous abnormality. Electronically Signed   By: Ulyses Jarred M.D.   On: 10/06/2016 19:47   Dg Foot 2 Views Right  Result Date: 10/06/2016 CLINICAL DATA:  Right foot pain EXAM: RIGHT FOOT - 2 VIEW COMPARISON:  None. FINDINGS: No fracture. No subluxation or dislocation. Degenerative changes noted in the great toe MTP joint with associated hallux valgus deformity. IMPRESSION: Degenerative changes in the great toe.  No acute bony abnormality. Electronically Signed   By: Misty Stanley M.D.   On: 10/06/2016 19:41    Procedures Procedures (including critical care time)  Medications Ordered in ED Medications  fentaNYL (SUBLIMAZE) injection 75 mcg (not administered)  sodium chloride 0.9 % bolus 1,000 mL (1,000 mLs Intravenous New Bag/Given 10/06/16 1821)  ondansetron (ZOFRAN) injection 4 mg (4 mg Intravenous Given 10/06/16 1821)     Initial  Impression / Assessment and Plan / ED Course  I have reviewed the triage vital signs and the nursing notes.  Pertinent labs & imaging results that were available during my care of the patient were reviewed by me and considered in my medical decision making (see chart for details).  Clinical Course as of Oct 07 2335  Thu Oct 06, 2016  1815 Patient is here for acute and chronic exacerbation of intermittent joint pain. No swelling of the leg there be concerning for DVT. Patient does have tenderness to palpation of the right knee at the medial aspect but denies any trauma, but given the patient's history of cancer we'll obtain plain films to assess for any bony lesions. Low suspicion for acute arterial occlusion given the patient's good pulses bilaterally.  Patient provided with IV pain medicine.  [PC]  1906 During his stay patient started complaining of substernal chest pain that was nonradiating. Patient reported that this was similar to his prior "heartburn" that normally improves with nitroglycerin. Patient denied any prior MIs or catheterizations. Stated that his cardiologist given the nitroglycerin "just in case," which is only had to use 4 times in the last several years. Given the patient's comorbidities we'll obtain cardiac workup to assess for possible ACS. Patient is not tachycardic or hypoxic. Low suspicion for pulmonary embolism. Presentation not classic for aortic dissection or esophageal perforation.  [PC]  1920 EKG without acute ischemic changes or evidence of pericarditis.  [PC]  2013 Initial troponin negative.    [PC]  2105 Labs notable for AKI with hyperK+. EKG w/o peadked [place]. Given kayexalate. Will admit.  [PC]    Clinical Course User Index [PC] Cardama, Grayce Sessions, MD      Final Clinical Impressions(s) /  ED Diagnoses   Final diagnoses:  AKI (acute kidney injury) ([place])  Hyperkalemia  Chronic pain of right knee     Cardama, Grayce Sessions, MD 10/06/16 2337

## 2016-10-06 NOTE — ED Notes (Signed)
Pt. Has foley and collected 542ml.

## 2016-10-07 ENCOUNTER — Telehealth: Payer: Self-pay | Admitting: Hematology

## 2016-10-07 ENCOUNTER — Inpatient Hospital Stay (HOSPITAL_COMMUNITY): Payer: Medicare Other

## 2016-10-07 ENCOUNTER — Encounter: Payer: Self-pay | Admitting: *Deleted

## 2016-10-07 ENCOUNTER — Encounter (HOSPITAL_COMMUNITY): Payer: Self-pay

## 2016-10-07 ENCOUNTER — Other Ambulatory Visit: Payer: Self-pay | Admitting: *Deleted

## 2016-10-07 DIAGNOSIS — I10 Essential (primary) hypertension: Secondary | ICD-10-CM

## 2016-10-07 DIAGNOSIS — N390 Urinary tract infection, site not specified: Secondary | ICD-10-CM | POA: Diagnosis present

## 2016-10-07 DIAGNOSIS — M79609 Pain in unspecified limb: Secondary | ICD-10-CM

## 2016-10-07 DIAGNOSIS — E875 Hyperkalemia: Secondary | ICD-10-CM | POA: Diagnosis present

## 2016-10-07 DIAGNOSIS — C20 Malignant neoplasm of rectum: Secondary | ICD-10-CM

## 2016-10-07 LAB — CBC
HCT: 27.3 % — ABNORMAL LOW (ref 39.0–52.0)
Hemoglobin: 8.9 g/dL — ABNORMAL LOW (ref 13.0–17.0)
MCH: 28 pg (ref 26.0–34.0)
MCHC: 32.6 g/dL (ref 30.0–36.0)
MCV: 85.8 fL (ref 78.0–100.0)
Platelets: 104 10*3/uL — ABNORMAL LOW (ref 150–400)
RBC: 3.18 MIL/uL — ABNORMAL LOW (ref 4.22–5.81)
RDW: 16.9 % — AB (ref 11.5–15.5)
WBC: 8.3 10*3/uL (ref 4.0–10.5)

## 2016-10-07 LAB — BASIC METABOLIC PANEL
ANION GAP: 15 (ref 5–15)
BUN: 69 mg/dL — ABNORMAL HIGH (ref 6–20)
CALCIUM: 7.7 mg/dL — AB (ref 8.9–10.3)
CO2: 21 mmol/L — ABNORMAL LOW (ref 22–32)
Chloride: 100 mmol/L — ABNORMAL LOW (ref 101–111)
Creatinine, Ser: 4.1 mg/dL — ABNORMAL HIGH (ref 0.61–1.24)
GFR, EST AFRICAN AMERICAN: 16 mL/min — AB (ref >60)
GFR, EST NON AFRICAN AMERICAN: 14 mL/min — AB (ref >60)
Glucose, Bld: 91 mg/dL (ref 65–99)
Potassium: 4.4 mmol/L (ref 3.5–5.1)
Sodium: 136 mmol/L (ref 135–145)

## 2016-10-07 LAB — CREATININE, URINE, RANDOM: Creatinine, Urine: 77.87 mg/dL

## 2016-10-07 MED ORDER — MAGIC MOUTHWASH: 15.0000 mL | Freq: Four times a day (QID) | ORAL | Status: DC | PRN

## 2016-10-07 MED ORDER — PHENOL 1.4 % MT LIQD: 2.0000 | OROMUCOSAL | Status: DC | PRN

## 2016-10-07 MED ORDER — LIDOCAINE HCL 2 % EX GEL
1.0000 "application " | Freq: Once | CUTANEOUS | Status: AC
  Administered 2016-10-07: 1 via URETHRAL
  Filled 2016-10-07: qty 5

## 2016-10-07 MED ORDER — MENTHOL 3 MG MT LOZG: 1.0000 | LOZENGE | OROMUCOSAL | Status: DC | PRN

## 2016-10-07 MED ORDER — ALUM & MAG HYDROXIDE-SIMETH 200-200-20 MG/5ML PO SUSP: 30.0000 mL | Freq: Four times a day (QID) | ORAL | Status: DC | PRN

## 2016-10-07 MED ORDER — PANTOPRAZOLE SODIUM 40 MG PO TBEC
40.0000 mg | DELAYED_RELEASE_TABLET | Freq: Every day | ORAL | Status: DC
  Administered 2016-10-07 – 2016-10-10 (×4): 40 mg via ORAL
  Filled 2016-10-07 (×4): qty 1

## 2016-10-07 MED ORDER — BLISTEX MEDICATED EX OINT
1.0000 "application " | TOPICAL_OINTMENT | Freq: Two times a day (BID) | CUTANEOUS | Status: DC
  Administered 2016-10-07 – 2016-10-10 (×6): 1 via TOPICAL
  Filled 2016-10-07: qty 6.3

## 2016-10-07 NOTE — Evaluation (Signed)
Physical Therapy Evaluation Patient Details Name: Patrick [name] MRN: [id number] DOB: 09/28/1950 Today's Date: 10/07/2016   History of Present Illness  Pt is a 66 yo male admitted through ED on 10/06/16 with increased LE and rectal pain. Upon admission, pt was found to have ARF, Hyperkalemia, Lower UTI. Pt is s/p robotic APR/colostomy on 09/28/16 for adenocarcinoma of the rectum. PMH significant for HTN, CHF, OA, CAD, DJD, dysrhythmia and SBO.    Clinical Impression  Pt presents with the above diagnosis and below deficits for therapy evaluation. Prior to admission, pt was staying at a SNF for rehab following surgery on 09/28/16. Pt expects to return to SNF even through he would like to return home ultimately. Pt requires Mod A for all mobility this session and will require continued acute follow-up in order to address below deficits prior to discharge to SNF.     Follow Up Recommendations SNF    Equipment Recommendations  None recommended by PT    Recommendations for Other Services OT consult     Precautions / Restrictions Precautions Precautions: Fall Precaution Comments: pt on suicide watch and has sitter in the room Restrictions Weight Bearing Restrictions: No      Mobility  Bed Mobility Overal bed mobility: Needs Assistance Bed Mobility: Supine to Sit     Supine to sit: Mod assist;HOB elevated     General bed mobility comments: Mod A to bring LE's EOB and to bring trunk upright at EOB  Transfers Overall transfer level: Needs assistance Equipment used: Rolling walker (2 wheeled) Transfers: Sit to/from [facility] Sit to Stand: Mod assist Stand pivot transfers: Min assist;Mod assist       General transfer comment: Mod A to stand from EOB with cues for hand placement. Pt with increased c/o knee pain with transfer. MIn-Mod A throughout transfer.   Ambulation/Gait                Stairs            Wheelchair Mobility    Modified Rankin (Stroke  Patients Only)       Balance Overall balance assessment: Needs assistance Sitting-balance support: No upper extremity supported;Feet supported Sitting balance-Leahy Scale: Good     Standing balance support: Bilateral upper extremity supported Standing balance-Leahy Scale: Poor Standing balance comment: reliant on RW for stability in standing                             Pertinent Vitals/Pain Pain Assessment: 0-10 Pain Score: 6  Pain Location: bottom Pain Descriptors / Indicators: Sore Pain Intervention(s): Monitored during session;Repositioned    Home Living Family/patient expects to be discharged to:: Skilled nursing facility                 Additional Comments: was in SNF prior to admission    Prior Function Level of Independence: Independent with assistive device(s);Independent         Comments: used cane as needed     Hand Dominance   Dominant Hand: Right    Extremity/Trunk Assessment   Upper Extremity Assessment Upper Extremity Assessment: Defer to OT evaluation    Lower Extremity Assessment Lower Extremity Assessment: Generalized weakness       Communication   Communication: No difficulties  Cognition Arousal/Alertness: Awake/alert Behavior During Therapy: WFL for tasks assessed/performed Overall Cognitive Status: Within Functional Limits for tasks assessed  General Comments General comments (skin integrity, edema, etc.): pt has increased pain around rectal area requiring constant repositioning. May benefit from a pressure relieving coushin    Exercises     Assessment/Plan    PT Assessment Patient needs continued PT services  PT Problem List Decreased strength;Decreased activity tolerance;Decreased balance;Decreased mobility;Pain       PT Treatment Interventions DME instruction;Gait training;Functional mobility training;Therapeutic activities;Therapeutic  exercise;Balance training    PT Goals (Current goals can be found in the Care Plan section)  Acute Rehab PT Goals Patient Stated Goal: get back to himself PT Goal Formulation: With patient Time For Goal Achievement: 10/21/16 Potential to Achieve Goals: Good    Frequency Min 3X/week   Barriers to discharge Decreased caregiver support      Co-evaluation               AM-PAC PT "6 Clicks" Daily Activity  Outcome Measure Difficulty turning over in bed (including adjusting bedclothes, sheets and blankets)?: Total Difficulty moving from lying on back to sitting on the side of the bed? : Total Difficulty sitting down on and standing up from a chair with arms (e.g., wheelchair, bedside commode, etc,.)?: A Lot Help needed moving to and from a bed to chair (including a wheelchair)?: A Lot Help needed walking in hospital room?: A Lot Help needed climbing 3-5 steps with a railing? : Total 6 Click Score: 9    End of Session Equipment Utilized During Treatment: Gait belt Activity Tolerance: Patient limited by pain Patient left: in chair;with call bell/phone within reach;with nursing/sitter in room Nurse Communication: Mobility status PT Visit Diagnosis: Unsteadiness on feet (R26.81);Difficulty in walking, not elsewhere classified (R26.2);Muscle weakness (generalized) (M62.81)    Time: 1501-1540 PT Time Calculation (min) (ACUTE ONLY): 39 min   Charges:   PT Evaluation $PT Eval Moderate Complexity: 1 Procedure PT Treatments $Therapeutic Activity: 23-37 mins   PT G Codes:        Scheryl Marten PT, DPT  [phone]   Jacqulyn Liner Sloan Leiter 10/07/2016, 3:55 PM

## 2016-10-07 NOTE — Progress Notes (Signed)
*  PRELIMINARY RESULTS* Vascular Ultrasound Right lower extremity venous duplex has been completed.  Preliminary findings: No evidence of deep vein thrombosis in the visualized veins of the right lower extremity.  Negative for baker's cyst.  Heterogeneous pocket of fluid seen anterior right knee, area of swelling.   Patrick [name] 10/07/2016, 9:29 AM

## 2016-10-07 NOTE — Progress Notes (Addendum)
Patient ID: Patrick [name], male   DOB: [birth year], 66 y.o.   MRN: [id number]  PROGRESS NOTE    Patrick [name]  HUT:[id number] DOB: 09/10/1950 DOA: 10/06/2016  PCP: Sela Hua, MD   Brief Narrative:  66 year old male with past medical history of rectal adenocarcinoma with permanent colostomy (09/28/2016), on chemo/radiation, CAD, CHF (EF 30-35% with grade 2 DD). Pt presented with right knee sharp, stabbing pain to the point he required assistance with ambulation. He also had subjective fevers, dark stool (he is on iron supplementation). He also reported suicidal ideations.  In ED, he was hemodynamically stable. Blood work was notable for Cr of 4.19 (recent baseline 1.3), potassium was 5.4 (given kayexalate). No acute findings on x ray of the knee. UA showed bacteria. Started on IV rocephin while awaiting urine cx results. Sitter at bedside for suicidal precautions.   Assessment & Plan:   Principal Problem:   ARF (acute renal failure) superimposed on CKD stage 3 - Baseline Cr 1.3 and on this admission 4.19 - Likely due to dehydration, UTI, torsemide, losartan - Those mentioned meds on hold - Cr 4.10 this am - Follow up BMP in am, if no improvement will call nephrology consultation   Active Problems:   Right knee pain  - No acute findings on x ray    Essential hypertension - BP stable, 129/61    Chronic systolic and diastolic heart failure (HCC) - Hold torsemide, metolazone and losartan due to kidney injury - Stable resp status    Hyperkalemia - Corrected with kayexalate    Adenocarcinoma of rectum s/p robotic APR/colostomy 09/28/2016 - Management per oncology    Thrombocytopenia (Hand) - Due to sequela of chemotherapy - Follow up CBC daily    Acute lower UTI - Continue Rocephin - Follow up urine cx results     Anemia of chronic disease - Due to CKD and malignancy - CBC daily     Suicidal ideation - Pt not talking much - Psych consult once pt more alert and communicative  -  Sitter at bedside     DVT prophylaxis: Heparin subQ Code Status: full code  Family Communication: sitter at bedside Disposition Plan: PT eval pending    Consultants:   None   Procedures:   RLE doppler 10/07/2016 - no LE DVT  Antimicrobials:   Rocephin 5/10 -->   Subjective: No overnight events.  Objective: Vitals:   10/07/16 0337 10/07/16 0550 10/07/16 0936 10/07/16 1329  BP:  (!) 112/52 (!) 102/55 129/61  Pulse:  74 76 86  Resp:  19 19 18   Temp:  98.4 F (36.9 C) 98.4 F (36.9 C) 99.2 F (37.3 C)  TempSrc:  Oral Oral Oral  SpO2:  97% 99% 100%  Weight: 96 kg (211 lb 10.3 oz)       Intake/Output Summary (Last 24 hours) at 10/07/16 1408 Last data filed at 10/07/16 1332  Gross per 24 hour  Intake             2625 ml  Output             5700 ml  Net            -3075 ml   Filed Weights   10/06/16 2352 10/07/16 0337  Weight: 96 kg (211 lb 10.3 oz) 96 kg (211 lb 10.3 oz)    Examination:  General exam: Appears calm and comfortable  Respiratory system: Clear to auscultation. Respiratory effort normal. Cardiovascular system: S1 &  S2 heard, RRR. Gastrointestinal system: Abdomen is nondistended, soft and nontender. No organomegaly or masses felt. Normal bowel sounds heard. Central nervous system: Alert and oriented. No focal neurological deficits. Extremities: Symmetric 5 x 5 power. Skin: No rashes, lesions or ulcers Psychiatry: Judgement and insight appear normal. Mood & affect appropriate.   Data Reviewed: I have personally reviewed following labs and imaging studies  CBC:  Recent Labs Lab 10/01/16 0533 10/02/16 0432 10/06/16 1819 10/07/16 0637  WBC 8.3 8.5 9.7 8.3  NEUTROABS  --   --  7.5  --   HGB 9.9* 10.3* 9.4* 8.9*  HCT 29.7* 31.1* 29.0* 27.3*  MCV 86.8 86.4 84.8 85.8  PLT 79* 97* 106* 382*   Basic Metabolic Panel:  Recent Labs Lab 10/01/16 0533 10/02/16 0432 10/06/16 1819 10/07/16 0637  NA  --   --  135 136  K 3.8 3.7 5.4* 4.4  CL   --   --  102 100*  CO2  --   --  22 21*  GLUCOSE  --   --  86 91  BUN  --   --  69* 69*  CREATININE 1.30* 1.28* 4.19* 4.10*  CALCIUM  --   --  8.0* 7.7*   GFR: Estimated Creatinine Clearance: 19.7 mL/min (A) (by C-G formula based on SCr of 4.1 mg/dL (H)). Liver Function Tests:  Recent Labs Lab 10/06/16 1819  AST 21  ALT 18  ALKPHOS 65  BILITOT 1.1  PROT 6.0*  ALBUMIN 2.8*   No results for input(s): LIPASE, AMYLASE in the last 168 hours. No results for input(s): AMMONIA in the last 168 hours. Coagulation Profile: No results for input(s): INR, PROTIME in the last 168 hours. Cardiac Enzymes: No results for input(s): CKTOTAL, CKMB, CKMBINDEX, TROPONINI in the last 168 hours. BNP (last 3 results) No results for input(s): PROBNP in the last 8760 hours. HbA1C: No results for input(s): HGBA1C in the last 72 hours. CBG: No results for input(s): GLUCAP in the last 168 hours. Lipid Profile: No results for input(s): CHOL, HDL, LDLCALC, TRIG, CHOLHDL, LDLDIRECT in the last 72 hours. Thyroid Function Tests: No results for input(s): TSH, T4TOTAL, FREET4, T3FREE, THYROIDAB in the last 72 hours. Anemia Panel: No results for input(s): VITAMINB12, FOLATE, FERRITIN, TIBC, IRON, RETICCTPCT in the last 72 hours. Urine analysis:    Component Value Date/Time   COLORURINE YELLOW 10/06/2016 2135   APPEARANCEUR CLOUDY (A) 10/06/2016 2135   LABSPEC 1.009 10/06/2016 2135   PHURINE 5.0 10/06/2016 2135   GLUCOSEU NEGATIVE 10/06/2016 2135   HGBUR SMALL (A) 10/06/2016 2135   HGBUR negative 12/26/2008 1541   [place] 10/06/2016 2135   [place] 10/06/2016 2135   PROTEINUR 30 (A) 10/06/2016 2135   UROBILINOGEN 1.0 04/03/2015 1725   NITRITE NEGATIVE 10/06/2016 2135   LEUKOCYTESUR LARGE (A) 10/06/2016 2135   Sepsis Labs: @LABRCNTIP (procalcitonin:4,lacticidven:4)   Recent Results (from the past 240 hour(s))  MRSA PCR Screening     Status: Abnormal   Collection Time:  09/28/16  4:06 PM  Result Value Ref Range Status   MRSA by PCR POSITIVE (A) NEGATIVE Final    Comment:        The GeneXpert MRSA Assay (FDA approved for NASAL specimens only), is one component of a comprehensive MRSA colonization surveillance program. It is not intended to diagnose MRSA infection nor to guide or monitor treatment for MRSA infections. RESULT CALLED TO, READ BACK BY AND VERIFIED WITH: S.ODOM RN AT 1931 ON 09/28/16 BY S.VANHOORNE  Radiology Studies: Dg Chest 2 View  Result Date: 10/06/2016 CLINICAL DATA:  Chest pain. EXAM: CHEST  2 VIEW COMPARISON:  Abdominal radiograph 05/29/2016 FINDINGS: Cardiomegaly, with prominence of the epicardial fat pad. No evidence pulmonary edema. Mild central bronchial thickening. No consolidation, pleural effusion or pneumothorax. No acute osseous abnormalities. IMPRESSION: Cardiomegaly without acute abnormality. Electronically Signed   By: Jeb Levering M.D.   On: 10/06/2016 21:04   Dg Knee 2 Views Right  Result Date: 10/06/2016 CLINICAL DATA:  Knee pain EXAM: RIGHT KNEE - 1-2 VIEW COMPARISON:  None. FINDINGS: The bones are osteopenic. There is medial compartment predominant narrowing of the femorotibial joint space, though this may be exaggerated by positioning. No fracture or dislocation. Narrowing of the patellofemoral joint space with superior patellar enthesophyte. IMPRESSION: Mild right knee osteoarthrosis without acute osseous abnormality. Electronically Signed   By: Ulyses Jarred M.D.   On: 10/06/2016 19:47   US Renal  Result Date: 10/06/2016 CLINICAL DATA:  Acute renal failure. EXAM: RENAL / URINARY TRACT ULTRASOUND COMPLETE COMPARISON:  MRI abdomen 04/17/2016. CT abdomen and pelvis 04/04/2016. FINDINGS: Right Kidney: Length: 13.3 cm. Multiple small cysts are demonstrated, mostly in the parapelvic region of the right kidney. Largest measures about 1.4 cm in diameter. Appearance is likely represent benign cysts. Similar changes  were seen on previous MRI. Mild renal parenchymal thinning. No hydronephrosis. Left Kidney: Length: 13.5 cm. Mild renal parenchymal thinning. No hydronephrosis. Small simple appearing cyst demonstrated in the mid pole. Bladder: Appears normal for degree of bladder distention. Bladder is moderately distended with a prevoid volume of 1.3 L. Patient is unable to void. IMPRESSION: Small bilateral renal cysts with mild parenchymal thinning. No hydronephrosis. Bladder is distended and patient is unable to void. Electronically Signed   By: Lucienne Capers M.D.   On: 10/06/2016 23:17   Dg Foot 2 Views Right  Result Date: 10/06/2016 CLINICAL DATA:  Right foot pain EXAM: RIGHT FOOT - 2 VIEW COMPARISON:  None. FINDINGS: No fracture. No subluxation or dislocation. Degenerative changes noted in the great toe MTP joint with associated hallux valgus deformity. IMPRESSION: Degenerative changes in the great toe.  No acute bony abnormality. Electronically Signed   By: Misty Stanley M.D.   On: 10/06/2016 19:41    Scheduled Meds: . finasteride  5 mg Oral Daily  . heparin  5,000 Units Subcutaneous Q8H  . mirtazapine  15 mg Oral QHS  . pantoprazole  40 mg Oral Daily  . pravastatin  20 mg Oral q1800  . tamsulosin  0.4 mg Oral Daily  . traZODone  300 mg Oral QHS   Continuous Infusions: . sodium chloride 75 mL/hr at 10/06/16 2333  . cefTRIAXone (ROCEPHIN)  IV       LOS: 1 day    Time spent: 25 minutes Greater than 50% of the time spent on counseling and coordinating the care.   Leisa Lenz, MD [facility] Pager [phone]  If 7PM-7AM, please contact night-coverage [email or web address] Password [facility] 10/07/2016, 2:08 PM

## 2016-10-07 NOTE — Consult Note (Signed)
   [facility] CM Inpatient Consult   10/07/2016  RAEQUON CATANZARO 1950-06-17 [id number]   Mr. Slane is active with [place] Management program. He was admitted from [facility] SN[facility].   Went to bedside to speak with Mr. Hardgrove. Sitter was at bedside. Will continue to follow hospital course. Will update [facility] team as needed.  Made inpatient LCSW aware that [facility] is active.   Marthenia Rolling, MSN-Ed, RN,BSN [facility] Liaison [phone]

## 2016-10-07 NOTE — Telephone Encounter (Signed)
Will see him in the hospital on Monday if he is still hospitalized at that time. Thank you!

## 2016-10-07 NOTE — Progress Notes (Signed)
Ceftriaxone for UTI per pharmacy ordered.  Ceftriaxone does not need renal adjustment.  [employer] policy allows pharmacy to change the ordered dose based on indication without contacting the provider, therefore a consult is not required.   Plan: -ceftriaxone 1g IV q24h -pharmacy to sign off as no adjustment needed.   Travante Knee D. Naw Lasala, PharmD, BCPS Clinical Pharmacist

## 2016-10-07 NOTE — Progress Notes (Signed)
New Admission Note:  Arrival Method: On stretcher from ED Mental Orientation: Alert & Oriented Telemetry: CCMD verified.  Assessment: Completed Skin: Refer to flowsheet IV: Left FA Pain: 2/10 Tubes: Safety Measures: Safety Fall Prevention Plan discussed with patient. Admission: Completed [address] [place] Orientation: Patient has been orientated to the room, unit and the staff.  Orders have been reviewed and implemented. Will continue to monitor the patient. Call light has been placed within reach and bed alarm has been activated.   Vassie Moselle, RN  Phone Number: [phone]

## 2016-10-07 NOTE — Telephone Encounter (Signed)
lvm to inform pt of 5/23 apps per sch msg

## 2016-10-07 NOTE — Patient Outreach (Signed)
Community Care Manager closing nursing program as pt is in hospital today and will back back to SNF for undefined period. Di Kindle [place], LCSW to follow in facility. This nurse will be assuming a new role and if pt does go home another Care Manager will be assigned.  Deloria Lair [facility] [phone]

## 2016-10-07 NOTE — Telephone Encounter (Signed)
Pt sister called to let Dr. Brett Albino know pt is in the hospital. Ottis Stain, [place]

## 2016-10-07 NOTE — Patient Outreach (Signed)
[facility]) Care Management  10/07/2016  Patrick [name] 05-Dec-1950 [id number]  CSW noted that patient presented to the emergency department at [facility], from [place] where patient currently resides to receive short-term rehabilitative services, due to "pain all over".  Patient is status post colostomy due to resection of adenocarcinoma of the rectum.  CSW will continue to follow patient while hospitalized, as well as assess and assist with any discharge panning needs and services. [name], BSW, MSW, LCSW  Licensed Education officer, [facility]  Mailing [address] [place], [place] [address] Physical Address-[address], [place], [place] [address] Toll Free Main # [phone] Fax # [phone] Cell # [phone]  Office # [phone] [email or web address]

## 2016-10-08 ENCOUNTER — Inpatient Hospital Stay (HOSPITAL_COMMUNITY): Payer: Medicare Other

## 2016-10-08 ENCOUNTER — Encounter (HOSPITAL_COMMUNITY): Payer: Self-pay

## 2016-10-08 DIAGNOSIS — N179 Acute kidney failure, unspecified: Principal | ICD-10-CM

## 2016-10-08 DIAGNOSIS — F0630 Mood disorder due to known physiological condition, unspecified: Secondary | ICD-10-CM

## 2016-10-08 DIAGNOSIS — Z87891 Personal history of nicotine dependence: Secondary | ICD-10-CM

## 2016-10-08 LAB — BASIC METABOLIC PANEL
ANION GAP: 9 (ref 5–15)
BUN: 46 mg/dL — ABNORMAL HIGH (ref 6–20)
CHLORIDE: 103 mmol/L (ref 101–111)
CO2: 27 mmol/L (ref 22–32)
Calcium: 8 mg/dL — ABNORMAL LOW (ref 8.9–10.3)
Creatinine, Ser: 2.82 mg/dL — ABNORMAL HIGH (ref 0.61–1.24)
GFR calc Af Amer: 25 mL/min — ABNORMAL LOW (ref >60)
GFR, EST NON AFRICAN AMERICAN: 21 mL/min — AB (ref >60)
Glucose, Bld: 119 mg/dL — ABNORMAL HIGH (ref 65–99)
POTASSIUM: 3.3 mmol/L — AB (ref 3.5–5.1)
SODIUM: 139 mmol/L (ref 135–145)

## 2016-10-08 LAB — TROPONIN I: TROPONIN I: 0.05 ng/mL — AB (ref <0.03)

## 2016-10-08 LAB — UREA NITROGEN, URINE: UREA NITROGEN UR: 429 mg/dL

## 2016-10-08 MED ORDER — ASPIRIN EC 81 MG PO TBEC
81.0000 mg | DELAYED_RELEASE_TABLET | Freq: Every day | ORAL | Status: DC
  Administered 2016-10-09 – 2016-10-10 (×3): 81 mg via ORAL
  Filled 2016-10-08 (×3): qty 1

## 2016-10-08 NOTE — Progress Notes (Signed)
Subjective/Chief Complaint: Readmit after APR with ARF and UTI.  Sore this AM, but feeling a little better.  Angry about not getting what he wanted for breakfast.     Objective: Vital signs in last 24 hours: Temp:  [97.1 F (36.2 C)-99.2 F (37.3 C)] 98 F (36.7 C) (05/12 0621) Pulse Rate:  [74-88] 88 (05/12 0621) Resp:  [18-19] 18 (05/12 0621) BP: (97-129)/(55-61) 97/56 (05/12 0621) SpO2:  [91 %-100 %] 91 % (05/12 0621) Weight:  [96.6 kg (212 lb 15.4 oz)-96.6 kg (213 lb)] 96.6 kg (212 lb 15.4 oz) (05/12 0456) Last BM Date: 10/07/16  Intake/Output from previous day: 05/11 0701 - 05/12 0700 In: 3330 [P.O.:1480; I.V.:1800; IV Piggyback:50] Out: 6834 [Urine:5000; Drains:250] Intake/Output this shift: No intake/output data recorded.  General appearance: alert, cooperative and mild distress Resp: breathing comfortably GI: soft, non-tender; bowel sounds normal; no masses,  no organomegaly and stoma in place wtih stool and air Perineum without dehiscence, drainage, or erythema.    Lab Results:   Recent Labs  10/06/16 1819 10/07/16 0637  WBC 9.7 8.3  HGB 9.4* 8.9*  HCT 29.0* 27.3*  PLT 106* 104*   BMET  Recent Labs  10/06/16 1819 10/07/16 0637  NA 135 136  K 5.4* 4.4  CL 102 100*  CO2 22 21*  GLUCOSE 86 91  BUN 69* 69*  CREATININE 4.19* 4.10*  CALCIUM 8.0* 7.7*   PT/INR No results for input(s): LABPROT, INR in the last 72 hours. ABG No results for input(s): PHART, HCO3 in the last 72 hours.  Invalid input(s): PCO2, PO2  Studies/Results: Dg Chest 2 View  Result Date: 10/06/2016 CLINICAL DATA:  Chest pain. EXAM: CHEST  2 VIEW COMPARISON:  Abdominal radiograph 05/29/2016 FINDINGS: Cardiomegaly, with prominence of the epicardial fat pad. No evidence pulmonary edema. Mild central bronchial thickening. No consolidation, pleural effusion or pneumothorax. No acute osseous abnormalities. IMPRESSION: Cardiomegaly without acute abnormality. Electronically Signed    By: Jeb Levering M.D.   On: 10/06/2016 21:04   Dg Knee 2 Views Right  Result Date: 10/06/2016 CLINICAL DATA:  Knee pain EXAM: RIGHT KNEE - 1-2 VIEW COMPARISON:  None. FINDINGS: The bones are osteopenic. There is medial compartment predominant narrowing of the femorotibial joint space, though this may be exaggerated by positioning. No fracture or dislocation. Narrowing of the patellofemoral joint space with superior patellar enthesophyte. IMPRESSION: Mild right knee osteoarthrosis without acute osseous abnormality. Electronically Signed   By: Ulyses Jarred M.D.   On: 10/06/2016 19:47   US Renal  Result Date: 10/06/2016 CLINICAL DATA:  Acute renal failure. EXAM: RENAL / URINARY TRACT ULTRASOUND COMPLETE COMPARISON:  MRI abdomen 04/17/2016. CT abdomen and pelvis 04/04/2016. FINDINGS: Right Kidney: Length: 13.3 cm. Multiple small cysts are demonstrated, mostly in the parapelvic region of the right kidney. Largest measures about 1.4 cm in diameter. Appearance is likely represent benign cysts. Similar changes were seen on previous MRI. Mild renal parenchymal thinning. No hydronephrosis. Left Kidney: Length: 13.5 cm. Mild renal parenchymal thinning. No hydronephrosis. Small simple appearing cyst demonstrated in the mid pole. Bladder: Appears normal for degree of bladder distention. Bladder is moderately distended with a prevoid volume of 1.3 L. Patient is unable to void. IMPRESSION: Small bilateral renal cysts with mild parenchymal thinning. No hydronephrosis. Bladder is distended and patient is unable to void. Electronically Signed   By: Lucienne Capers M.D.   On: 10/06/2016 23:17   Dg Foot 2 Views Right  Result Date: 10/06/2016 CLINICAL DATA:  Right foot  pain EXAM: RIGHT FOOT - 2 VIEW COMPARISON:  None. FINDINGS: No fracture. No subluxation or dislocation. Degenerative changes noted in the great toe MTP joint with associated hallux valgus deformity. IMPRESSION: Degenerative changes in the great toe.  No  acute bony abnormality. Electronically Signed   By: Misty Stanley M.D.   On: 10/06/2016 19:41    Anti-infectives: Anti-infectives    Start     Dose/Rate Route Frequency Ordered Stop   10/07/16 2200  cefTRIAXone (ROCEPHIN) 1 g in dextrose 5 % 50 mL IVPB     1 g 100 mL/hr over 30 Minutes Intravenous Every 24 hours 10/06/16 2353     10/07/16 0000  cefTRIAXone (ROCEPHIN) 2 g in dextrose 5 % 50 mL IVPB     2 g 100 mL/hr over 30 Minutes Intravenous  Once 10/06/16 2349 10/07/16 0030      Assessment/Plan: s/p APR - Dr. Johney Maine 5/2 for low rectal cancer.  PERMANENT end colostomy  ARF, UTI per medicine  Diet as tolerated. Wounds OK.   LOS: 2 days    [facility] 10/08/2016

## 2016-10-08 NOTE — Evaluation (Signed)
Occupational Therapy Evaluation Patient Details Name: Patrick [name] MRN: [id number] DOB: August 27, 1950 Today's Date: 10/08/2016    History of Present Illness Pt is a 66 yo male admitted through ED on 10/06/16 with increased LE and rectal pain. Upon admission, pt was found to have ARF, Hyperkalemia, Lower UTI. Pt is s/p robotic APR/colostomy on 09/28/16 for adenocarcinoma of the rectum. PMH significant for HTN, CHF, OA, CAD, DJD, dysrhythmia and SBO.     Clinical Impression   Pt has been participating in rehab at at [facility]. He presents with generalized weakness, LE pain and inability to ambulate. He transferred with plus 2 assistance to recliner this date. Pt requires set up to total assist with ADL. Recommend return to SNF for continued rehab. Will follow acutely.    Follow Up Recommendations  SNF;Supervision/Assistance - 24 hour    Equipment Recommendations       Recommendations for Other Services       Precautions / Restrictions Precautions Precautions: Fall Precaution Comments: pt on suicide watch and has sitter in the room Restrictions Weight Bearing Restrictions: No      Mobility Bed Mobility      General bed mobility comments: Pt sitting EOB with PT upon arrival  Transfers Overall transfer level: Needs assistance Equipment used: Rolling walker (2 wheeled) Transfers: Sit to/from [facility] Sit to Stand: Max assist;+2 physical assistance Stand pivot transfers: Mod assist;+2 physical assistance       General transfer comment: Mod to max A +2 to stand and Mod A +2 throughout transfer.     Balance Overall balance assessment: Needs assistance Sitting-balance support: No upper extremity supported;Feet supported Sitting balance-Leahy Scale: Good     Standing balance support: Bilateral upper extremity supported Standing balance-Leahy Scale: Poor Standing balance comment: released walker with one hand at one point during standing                           ADL either performed or assessed with clinical judgement   ADL Overall ADL's : Needs assistance/impaired Eating/Feeding: Independent;Bed level   Grooming: Wash/dry hands;Wash/dry face;Sitting;Set up   Upper Body Bathing: Minimal assistance;Sitting   Lower Body Bathing: Total assistance;+2 for physical assistance;Sit to/from stand   Upper Body Dressing : Minimal assistance;Sitting   Lower Body Dressing: Total assistance;+2 for physical assistance;Sit to/from stand   Toilet Transfer: +2 for physical assistance;RW;Stand-pivot;Moderate assistance Toilet Transfer Details (indicate cue type and reason): simulated to chair Toileting- Clothing Manipulation and Hygiene: Total assistance;+2 for physical assistance;Sit to/from stand       Functional mobility during ADLs:  (was not able to ambulate due to pain)       Vision Patient Visual Report: No change from baseline       Perception     Praxis      Pertinent Vitals/Pain Pain Assessment: Faces Pain Score: 8  Faces Pain Scale: Hurts whole lot Pain Location: ankles and knees with mobility Pain Descriptors / Indicators: Aching;Grimacing;Guarding;Moaning;Sharp Pain Intervention(s): Repositioned;Limited activity within patient's tolerance (distracted)     Hand Dominance Right   Extremity/Trunk Assessment Upper Extremity Assessment Upper Extremity Assessment: Generalized weakness   Lower Extremity Assessment Lower Extremity Assessment: Defer to PT evaluation       Communication Communication Communication: No difficulties   Cognition Arousal/Alertness: Awake/alert Behavior During Therapy: WFL for tasks assessed/performed Overall Cognitive Status: Within Functional Limits for tasks assessed  General Comments: dramatic response to pain, easily redirected   General Comments  OT assisted with sit to stand and stand pivot transfer    Exercises     Shoulder  Instructions      Home Living Family/patient expects to be discharged to:: Skilled nursing facility                                 Additional Comments: was in SNF for rehab prior to admission      Prior Functioning/Environment Level of Independence: Independent with assistive device(s);Independent        Comments: used cane as needed, drives, caregiver for his sister prior to admission        OT Problem List: Decreased strength;Decreased activity tolerance;Impaired balance (sitting and/or standing);Decreased knowledge of use of DME or AE;Pain      OT Treatment/Interventions: Self-care/ADL training;DME and/or AE instruction;Balance training;Patient/family education;Therapeutic activities    OT Goals(Current goals can be found in the care plan section) Acute Rehab OT Goals Patient Stated Goal: get back to himself OT Goal Formulation: With patient Time For Goal Achievement: 10/22/16 Potential to Achieve Goals: Good ADL Goals Pt Will Perform Grooming: with min assist;standing Pt Will Perform Upper Body Dressing: with set-up;sitting Pt Will Perform Lower Body Dressing: with mod assist;sit to/from stand;with adaptive equipment Pt Will Transfer to Toilet: with mod assist;stand pivot transfer;bedside commode Pt Will Perform Toileting - Clothing Manipulation and hygiene: with mod assist;sit to/from stand  OT Frequency: Min 2X/week   Barriers to D/C: Decreased caregiver support          Co-evaluation PT/OT/SLP Co-Evaluation/Treatment: Yes Reason for Co-Treatment: For patient/therapist safety PT goals addressed during session: Mobility/safety with mobility OT goals addressed during session: ADL's and self-care      AM-PAC PT "6 Clicks" Daily Activity     Outcome Measure Help from another person eating meals?: None Help from another person taking care of personal grooming?: A Little Help from another person toileting, which includes using toliet, bedpan, or  urinal?: Total Help from another person bathing (including washing, rinsing, drying)?: A Lot Help from another person to put on and taking off regular upper body clothing?: A Little Help from another person to put on and taking off regular lower body clothing?: Total 6 Click Score: 14   End of Session Equipment Utilized During Treatment: Gait belt;Rolling walker Nurse Communication: Other (comment) (colostomy bag needs burping)  Activity Tolerance: Patient limited by pain Patient left: in chair;with call bell/phone within reach;with nursing/sitter in room  OT Visit Diagnosis: Unsteadiness on feet (R26.81);Muscle weakness (generalized) (M62.81);Pain                Time: 1000-1029 OT Time Calculation (min): 29 min Charges:  OT General Charges $OT Visit: 1 Procedure OT Evaluation $OT Eval Moderate Complexity: 1 Procedure G-Codes:       Malka So 10/08/2016, 11:43 AM  [phone]

## 2016-10-08 NOTE — NC FL2 (Signed)
[place] LEVEL OF CARE SCREENING TOOL     IDENTIFICATION  Patient Name: Patrick [name] Birthdate: Mar 15, 1951 Sex: male Admission Date (Current Location): 10/06/2016  [facility] and [place] Number:  Herbalist and Address:  [facility] [place], [place] [address], [place], [place] [address]      Provider Number: [id number]  Attending Physician Name and Address:  Robbie Lis, MD  Relative Name and Phone Number:       Current Level of Care: Hospital Recommended Level of Care: [place] Prior Approval Number:    Date Approved/Denied:   PASRR Number: [id number]  Discharge Plan: SNF    Current Diagnoses: Patient Active Problem List   Diagnosis Date Noted  . Acute lower UTI 10/07/2016  . Hyperkalemia 10/07/2016  . ARF (acute renal failure) ([place]) 10/06/2016  . Hypomagnesemia 09/29/2016  . History of resection of rectum 09/28/2016  . Rectal cancer ([place]) 09/28/2016  . Atrial fibrillation ([place]) 09/11/2016  . Coronary artery disease involving native coronary artery of native heart with angina pectoris ([place]) 09/11/2016  . Nonischemic cardiomyopathy ([place]) 09/11/2016  . Acute blood loss anemia 05/29/2016  . Thrombocytopenia ([place]) 05/29/2016  . Iron deficiency anemia due to chronic blood loss 04/15/2016  . Adenocarcinoma of rectum s/p robotic APR/colostomy 09/28/2016 04/14/2016  . Chronic kidney disease (CKD), stage III (moderate) 02/05/2016  . Abnormal finding on cardiovascular stress test 02/05/2016  . Cardiomyopathy, ischemic 06/17/2015  . Chronic systolic heart failure ([place]) 06/17/2015  . Major depressive disorder, recurrent episode ([place]) 04/17/2015  . Other iron deficiency anemias   . Health care maintenance 03/11/2014  . Obesity, unspecified 10/03/2013  . Dyslipidemia 08/13/2013  . CARDIAC MURMUR 01/26/2010  . MACULAR DEGENERATION 05/02/2008  . ERECTILE DYSFUNCTION 03/13/2007  . Essential hypertension 01/16/2007  . BPH  (benign prostatic hyperplasia) 01/16/2007    [place] RESPIRATION BLADDER Height & Weight     Self, Time, Situation, Place  O2 (Nasal Canula 2 L) Continent, Indwelling catheter Weight: 212 lb 15.4 oz (96.6 kg) Height:     BEHAVIORAL SYMPTOMS/MOOD NEUROLOGICAL BOWEL NUTRITION STATUS   (None. Suicide sitter at bedside. Has thoughts but no active plan.)  (None) Continent, Colostomy Diet (Heart healthy)  AMBULATORY STATUS COMMUNICATION OF NEEDS Skin   Extensive Assist Verbally Bruising, Surgical wounds                       Personal Care Assistance Level of Assistance  Bathing, Feeding, Dressing Bathing Assistance: Maximum assistance Feeding assistance: Independent Dressing Assistance: Maximum assistance     Functional Limitations Info  Sight, Hearing, Speech Sight Info: Adequate Hearing Info: Adequate Speech Info: Adequate    SPECIAL CARE FACTORS FREQUENCY  PT (By licensed PT), OT (By licensed OT), Blood pressure     PT Frequency: 5 x week OT Frequency: 5 x week            Contractures Contractures Info: Not present    Additional Factors Info  Code Status, Allergies, Isolation Precautions, Psychotropic Code Status Info: Full Allergies Info: Nsaids Psychotropic Info: Depression: Remeron Sol-Tab 15 mg PO QHS, Trazodone 300 mg PO QHS.   Isolation Precautions Info: Contact: MRSA     Current Medications (10/08/2016):  This is the current hospital active medication list Current Facility-Administered Medications  Medication Dose Route Frequency Provider Last Rate Last Dose  . 0.9 %  sodium chloride infusion   Intravenous Continuous Fuller Plan A, MD 75 mL/hr at 10/08/16 0618    .  acetaminophen (TYLENOL) tablet 650 mg  650 mg Oral Q6H PRN Norval Morton, MD       Or  . acetaminophen (TYLENOL) suppository 650 mg  650 mg Rectal Q6H PRN Fuller Plan A, MD      . alum & mag hydroxide-simeth (MAALOX/MYLANTA) 200-200-20 MG/5ML suspension 30 mL  30 mL Oral Q6H PRN  Michael Boston, MD      . cefTRIAXone (ROCEPHIN) 1 g in dextrose 5 % 50 mL IVPB  1 g Intravenous Q24H Erenest Blank, RPH   Stopped at 10/07/16 2235  . finasteride (PROSCAR) tablet 5 mg  5 mg Oral Daily Tamala Julian, Rondell A, MD   5 mg at 10/08/16 0916  . heparin injection 5,000 Units  5,000 Units Subcutaneous Q8H Fuller Plan A, MD   5,000 Units at 10/08/16 0618  . ipratropium-albuterol (DUONEB) 0.5-2.5 (3) MG/3ML nebulizer solution 3 mL  3 mL Nebulization Q4H PRN Smith, Rondell A, MD      . lip balm (BLISTEX) ointment 1 application  1 application Topical BID Michael Boston, MD   1 application at 23/53/61 1147  . magic mouthwash  15 mL Oral QID PRN Michael Boston, MD      . menthol-cetylpyridinium (CEPACOL) lozenge 3 mg  1 lozenge Oral PRN Michael Boston, MD      . methocarbamol (ROBAXIN) tablet 750 mg  750 mg Oral QID PRN Fuller Plan A, MD      . mirtazapine (REMERON SOL-TAB) disintegrating tablet 15 mg  15 mg Oral QHS Smith, Rondell A, MD   15 mg at 10/07/16 2205  . nitroGLYCERIN (NITROSTAT) SL tablet 0.4 mg  0.4 mg Sublingual Q5 min PRN Fuller Plan A, MD      . ondansetron (ZOFRAN) tablet 4 mg  4 mg Oral Q6H PRN Fuller Plan A, MD       Or  . ondansetron (ZOFRAN) injection 4 mg  4 mg Intravenous Q6H PRN Smith, Rondell A, MD      . oxyCODONE (Oxy IR/ROXICODONE) immediate release tablet 5-10 mg  5-10 mg Oral Q4H PRN Fuller Plan A, MD   10 mg at 10/08/16 0916  . pantoprazole (PROTONIX) EC tablet 40 mg  40 mg Oral Daily Fuller Plan A, MD   40 mg at 10/08/16 0916  . phenol (CHLORASEPTIC) mouth spray 2 spray  2 spray Mouth/Throat PRN Michael Boston, MD      . pravastatin (PRAVACHOL) tablet 20 mg  20 mg Oral q1800 Fuller Plan A, MD   20 mg at 10/07/16 1841  . tamsulosin (FLOMAX) capsule 0.4 mg  0.4 mg Oral Daily Tamala Julian, Rondell A, MD   0.4 mg at 10/08/16 0916  . traZODone (DESYREL) tablet 300 mg  300 mg Oral QHS Smith, Rondell A, MD   300 mg at 10/08/16 0010     Discharge  Medications: Please see discharge summary for a list of discharge medications.  Relevant Imaging Results:  Relevant Lab Results:   Additional Information SS#: [id number]. Has suicide sitter due to thoughts but no active plan.  Candie Chroman, LCSW

## 2016-10-08 NOTE — Clinical Social Work Note (Signed)
Clinical Social Work Assessment  Patient Details  Name: Patrick [name] MRN: [id number] Date of Birth: [birth year]  Date of referral:  10/08/16               Reason for consult:  Facility Placement, Discharge Planning                Permission sought to share information with:  Facility Art therapist granted to share information::  Yes, Verbal Permission Granted  Name::        Agency::  [facility]  Relationship::     Contact Information:     Housing/Transportation Living arrangements for the past 2 months:  [place] of Information:  Patient, Medical Team Patient Interpreter Needed:  None Criminal Activity/Legal Involvement Pertinent to Current Situation/Hospitalization:  No - Comment as needed Significant Relationships:  Siblings Lives with:  Facility Resident Do you feel safe going back to the place where you live?  Yes Need for family participation in patient care:  Yes (Comment)  Care giving concerns:  Patient was admitted from [facility]. PT recommending SNF once medically stable for discharge.   Social Worker assessment / plan:  CSW met with patient. Suicide sitter at bedside. CSW introduced role and explained that PT recommendations would be discussed. Patient confirmed that he was admitted from [facility] and plans to return once discharged. Patient states that he was only there for a few days before being readmitted to the hospital. Patient voiced frustration over his sudden decline in health. No further concerns. CSW encouraged patient to contact CSW as needed. CSW will continue to follow patient for support and facilitate discharge back to SNF once medically stable. Patient must be without a sitter for 24 hours prior to discharge to SNF.  Employment status:  Retired Nurse, adult PT Recommendations:  [place] / Referral to community resources:  [place]  Patient/Family's Response to care:  Patient agreeable to return to SNF. Patient's sister supportive and involved in patient's care. Patient appreciated social work intervention.  Patient/Family's Understanding of and Emotional Response to Diagnosis, Current Treatment, and Prognosis:  Patient understands the reason for admission but does not understand how his decline in health could have happened so quickly. Patient understands his need to return to rehab prior to returning home. Patient appears happy with hospital care.  Emotional Assessment Appearance:  Appears stated age Attitude/Demeanor/Rapport:  Other (Pleasant) Affect (typically observed):  Accepting, Appropriate, Calm, Pleasant Orientation:  Oriented to Self, Oriented to Place, Oriented to  Time, Oriented to Situation Alcohol / Substance use:  Never Used Psych involvement (Current and /or in the community):  No (Comment)  Discharge Needs  Concerns to be addressed:  Care Coordination, Mental Health Concerns Readmission within the last 30 days:  Yes Current discharge risk:  Dependent with Mobility, Other (Has suicide sitter) Barriers to Discharge:  Continued Medical Work up, Requiring sitter/restraints   Candie Chroman, LCSW 10/08/2016, 1:38 PM

## 2016-10-08 NOTE — Consult Note (Signed)
[facility] Face-to-Face Psychiatry Consult   Reason for Consult:  Depression. Voiced suicidal ideation  Referring Physician:  Admitting Physician Patient Identification: Patrick [name] MRN:  [id number] Principal Diagnosis: ARF (acute renal failure) [facility]) Diagnosis:   Patient Active Problem List   Diagnosis Date Noted  . Acute lower UTI [N39.0] 10/07/2016  . Hyperkalemia [E87.5] 10/07/2016  . ARF (acute renal failure) ([place]) [N17.9] 10/06/2016  . Hypomagnesemia [E83.42] 09/29/2016  . History of resection of rectum [Z90.49] 09/28/2016  . Rectal cancer ([place]) [C20] 09/28/2016  . Atrial fibrillation ([place]) [I48.91] 09/11/2016  . Coronary artery disease involving native coronary artery of native heart with angina pectoris ([place]) [I25.119] 09/11/2016  . Nonischemic cardiomyopathy ([place]) [I42.8] 09/11/2016  . Acute blood loss anemia [D62] 05/29/2016  . Thrombocytopenia ([place]) [D69.6] 05/29/2016  . Iron deficiency anemia due to chronic blood loss [D50.0] 04/15/2016  . Adenocarcinoma of rectum s/p robotic APR/colostomy 09/28/2016 [C20] 04/14/2016  . Chronic kidney disease (CKD), stage III (moderate) [N18.3] 02/05/2016  . Abnormal finding on cardiovascular stress test [R94.39] 02/05/2016  . Cardiomyopathy, ischemic [I25.5] 06/17/2015  . Chronic systolic heart failure ([place]) [I50.22] 06/17/2015  . Major depressive disorder, recurrent episode ([place]) [F33.9] 04/17/2015  . Other iron deficiency anemias [D50.8]   . Health care maintenance [Z00.00] 03/11/2014  . Obesity, unspecified [E66.9] 10/03/2013  . Dyslipidemia [E78.5] 08/13/2013  . CARDIAC MURMUR [R01.1] 01/26/2010  . MACULAR DEGENERATION [H35.30] 05/02/2008  . ERECTILE DYSFUNCTION [F52.8] 03/13/2007  . Essential hypertension [I10] 01/16/2007  . BPH (benign prostatic hyperplasia) [N40.0] 01/16/2007    Total Time spent with patient: 1 hour  Subjective:   Patrick [name] is a 66 y.o. male patient admitted with medical history significant of adenocarcinoma rectal  cancer(s/p resection with permanent colostomy on 09/28/2016, chemo, and radiation),  Consult requested for patient voiced suicidal ideation . States he was in significant pain and was half awake and hurting. He had no intent to follow up with any such plan  HPI:  Patrick [name] is a 66 y.o. male with medical history significant of adenocarcinoma rectal cancer(s/p resection with permanent colostomy on 09/28/2016, chemo, and radiation), CAD, and CHF last LVEF 30-35% with grade 2 dFx; who presents with complaints of right knee pain. He had been at a skilled nursing facility following his recent colon cancer resection surgery. Patient states that he's had acute worsening of his right knee pain that he describes as sharp and stabbing in nature. He reports symptoms are not new and have intermittently been in the left leg as well. Normally patient able to ambulate without assistance,  On evaluation today, patient is calm, cooperative. Alert .  States he likes his assisted living facility , prior to that was living and providing care to his sister States he voiced suicidal ideation out of frustration of pain . As of now denies suicidal toughts. Was pleasant and coherent to talk with States he is on mirtazepine that helps. It was the pain and recent cancer surgery effects which has made him frustrated  No psychotic symptoms  No mania Denies drug or alcohol use  Past Psychiatric History: depression treatment with primary care  Risk to Self: Is patient at risk for suicide?: Yes Risk to Others:   Prior Inpatient Therapy:   Prior Outpatient Therapy:    Past Medical History:  Past Medical History:  Diagnosis Date  . Arthritis    "right hand; right knee" (06/19/2014)  . CAD (coronary artery disease)    2v CAD with subtotal occlusion of  small co-dominant RCA and borderline lesion in moderate-sized OM-1  . CHF (congestive heart failure) (HCC)    Preserved EF  . Coronary artery disease involving native coronary  artery of native heart with angina pectoris ([place]) 09/11/2016  . Degenerative joint disease of knee, right   . Dysrhythmia   . Enlarged prostate   . Hyperlipidemia   . Hypertension   . Pneumonia 05/2014  . Prediabetes 10/03/2014  . Scrotal edema 04/03/2015  . SMALL BOWEL OBSTRUCTION, HX OF 08/21/2007   Annotation: with narrowing in the ileocecal region Qualifier: Diagnosis of  By: Hassell Done FNP, Tori Milks      Past Surgical History:  Procedure Laterality Date  . CARDIAC CATHETERIZATION N/A 02/16/2016   Procedure: Right/Left Heart Cath and Coronary Angiography;  Surgeon: Jolaine Artist, MD;  Location: [facility] LAB;  Service: Cardiovascular;  Laterality: N/A;  . COLONOSCOPY N/A 04/01/2016   Procedure: COLONOSCOPY;  Surgeon: Leighton Ruff, MD;  Location: [facility] ENDOSCOPY;  Service: Endoscopy;  Laterality: N/A;  . INGUINAL HERNIA REPAIR Left 1990's  . IR GENERIC HISTORICAL  07/05/2016   IR RADIOLOGIST EVAL & MGMT 07/05/2016 Corrie Mckusick, DO [facility] INTERV RAD  . XI ROBOT ABDOMINAL PERINEAL RESECTION N/A 09/28/2016   Procedure: XI ROBOT ABDOMINOPERINEAL RESECTION WITH PERMANENT COLOSTOMY ERAS PATHWAY;  Surgeon: Michael Boston, MD;  Location: [facility] ORS;  Service: General;  Laterality: N/A;   Family History:  Family History  Problem Relation Age of Onset  . Hypertension Mother   . Diabetes Mother   . Stroke Mother   . Cancer Father 95       Prostate  . Dementia Father   . COPD Sister   . Arthritis Sister   . Edema Sister    Family Psychiatric  History: see chart Social History:  History  Alcohol Use No     History  Drug Use No    Social History   Social History  . Marital status: Single    Spouse name: N/A  . Number of children: N/A  . Years of education: N/A   Social History Main Topics  . Smoking status: Former Smoker    Years: 5.00    Types: Pipe, Landscape architect  . Smokeless tobacco: Never Used     Comment: 06/19/2014 "stopped smoking in ~ 2014; used to smoke a pipe or cigar a couple  times/month"  . Alcohol use No  . Drug use: No  . Sexual activity: No   Other Topics Concern  . None   Social History Narrative   Single, lives w/his sister of whom he is caregiver      Additional Social History:    Allergies:   Allergies  Allergen Reactions  . Nsaids     Kidney disease    Labs:  Results for orders placed or performed during the hospital encounter of 10/06/16 (from the past 48 hour(s))  CBC with Differential     Status: Abnormal   Collection Time: 10/06/16  6:19 PM  Result Value Ref Range   WBC 9.7 4.0 - 10.5 K/uL   RBC 3.42 (L) 4.22 - 5.81 MIL/uL   Hemoglobin 9.4 (L) 13.0 - 17.0 g/dL   HCT 29.0 (L) 39.0 - 52.0 %   MCV 84.8 78.0 - 100.0 fL   MCH 27.5 26.0 - 34.0 pg   MCHC 32.4 30.0 - 36.0 g/dL   RDW 16.9 (H) 11.5 - 15.5 %   Platelets 106 (L) 150 - 400 K/uL    Comment: REPEATED TO VERIFY PLATELET  COUNT CONFIRMED BY SMEAR    Neutrophils Relative % 77 %   Neutro Abs 7.5 1.7 - 7.7 K/uL   Lymphocytes Relative 13 %   Lymphs Abs 1.2 0.7 - 4.0 K/uL   Monocytes Relative 10 %   Monocytes Absolute 0.9 0.1 - 1.0 K/uL   Eosinophils Relative 1 %   Eosinophils Absolute 0.1 0.0 - 0.7 K/uL   Basophils Relative 0 %   Basophils Absolute 0.0 0.0 - 0.1 K/uL  Comprehensive metabolic panel     Status: Abnormal   Collection Time: 10/06/16  6:19 PM  Result Value Ref Range   Sodium 135 135 - 145 mmol/L   Potassium 5.4 (H) 3.5 - 5.1 mmol/L   Chloride 102 101 - 111 mmol/L   CO2 22 22 - 32 mmol/L   Glucose, Bld 86 65 - 99 mg/dL   BUN 69 (H) 6 - 20 mg/dL   Creatinine, Ser 4.19 (H) 0.61 - 1.24 mg/dL   Calcium 8.0 (L) 8.9 - 10.3 mg/dL   Total Protein 6.0 (L) 6.5 - 8.1 g/dL   Albumin 2.8 (L) 3.5 - 5.0 g/dL   AST 21 15 - 41 U/L   ALT 18 17 - 63 U/L   Alkaline Phosphatase 65 38 - 126 U/L   Total Bilirubin 1.1 0.3 - 1.2 mg/dL   GFR calc non Af Amer 13 (L) >60 mL/min   GFR calc Af Amer 15 (L) >60 mL/min    Comment: (NOTE) The eGFR has been calculated using the CKD EPI  equation. This calculation has not been validated in all clinical situations. eGFR's persistently <60 mL/min signify possible Chronic Kidney Disease.    Anion gap 11 5 - 15  I-stat troponin, ED     Status: None   Collection Time: 10/06/16  6:55 PM  Result Value Ref Range   Troponin i, poc 0.06 0.00 - 0.08 ng/mL   Comment 3            Comment: Due to the release kinetics of cTnI, a negative result within the first hours of the onset of symptoms does not rule out myocardial infarction with certainty. If myocardial infarction is still suspected, repeat the test at appropriate intervals.   Urinalysis, Routine w reflex microscopic     Status: Abnormal   Collection Time: 10/06/16  9:35 PM  Result Value Ref Range   Color, Urine YELLOW YELLOW   APPearance CLOUDY (A) CLEAR   Specific Gravity, Urine 1.009 1.005 - 1.030   pH 5.0 5.0 - 8.0   Glucose, UA NEGATIVE NEGATIVE mg/dL   Hgb urine dipstick SMALL (A) NEGATIVE   Bilirubin Urine NEGATIVE NEGATIVE   Ketones, ur NEGATIVE NEGATIVE mg/dL   Protein, ur 30 (A) NEGATIVE mg/dL   Nitrite NEGATIVE NEGATIVE   Leukocytes, UA LARGE (A) NEGATIVE   RBC / HPF TOO NUMEROUS TO COUNT 0 - 5 RBC/hpf   WBC, UA TOO NUMEROUS TO COUNT 0 - 5 WBC/hpf   Bacteria, UA MANY (A) NONE SEEN   Squamous Epithelial / LPF NONE SEEN NONE SEEN   Mucous PRESENT    Hyaline Casts, UA PRESENT   Urine culture     Status: Abnormal (Preliminary result)   Collection Time: 10/06/16  9:35 PM  Result Value Ref Range   Specimen Description URINE, RANDOM    Special Requests NONE    Culture (A)     >=100,000 COLONIES/mL ENTEROCOCCUS FAECALIS SUSCEPTIBILITIES TO FOLLOW    Report Status PENDING   Urea nitrogen, urine  Status: None   Collection Time: 10/06/16  9:35 PM  Result Value Ref Range   Urea Nitrogen, Ur 429 Not Estab. mg/dL    Comment: (NOTE) Performed At: [facility] [place], [place] [id number] Lindon Romp MD IR:[id number]    Creatinine, urine, random     Status: None   Collection Time: 10/06/16  9:35 PM  Result Value Ref Range   Creatinine, Urine 77.87 mg/dL  CBC     Status: Abnormal   Collection Time: 10/07/16  6:37 AM  Result Value Ref Range   WBC 8.3 4.0 - 10.5 K/uL   RBC 3.18 (L) 4.22 - 5.81 MIL/uL   Hemoglobin 8.9 (L) 13.0 - 17.0 g/dL   HCT 27.3 (L) 39.0 - 52.0 %   MCV 85.8 78.0 - 100.0 fL   MCH 28.0 26.0 - 34.0 pg   MCHC 32.6 30.0 - 36.0 g/dL   RDW 16.9 (H) 11.5 - 15.5 %   Platelets 104 (L) 150 - 400 K/uL    Comment: REPEATED TO VERIFY CONSISTENT WITH PREVIOUS RESULT   Basic metabolic panel     Status: Abnormal   Collection Time: 10/07/16  6:37 AM  Result Value Ref Range   Sodium 136 135 - 145 mmol/L   Potassium 4.4 3.5 - 5.1 mmol/L   Chloride 100 (L) 101 - 111 mmol/L   CO2 21 (L) 22 - 32 mmol/L   Glucose, Bld 91 65 - 99 mg/dL   BUN 69 (H) 6 - 20 mg/dL   Creatinine, Ser 4.10 (H) 0.61 - 1.24 mg/dL   Calcium 7.7 (L) 8.9 - 10.3 mg/dL   GFR calc non Af Amer 14 (L) >60 mL/min   GFR calc Af Amer 16 (L) >60 mL/min    Comment: (NOTE) The eGFR has been calculated using the CKD EPI equation. This calculation has not been validated in all clinical situations. eGFR's persistently <60 mL/min signify possible Chronic Kidney Disease.    Anion gap 15 5 - 15    Current Facility-Administered Medications  Medication Dose Route Frequency Provider Last Rate Last Dose  . 0.9 %  sodium chloride infusion   Intravenous Continuous Fuller Plan A, MD 75 mL/hr at 10/08/16 0618    . acetaminophen (TYLENOL) tablet 650 mg  650 mg Oral Q6H PRN Norval Morton, MD       Or  . acetaminophen (TYLENOL) suppository 650 mg  650 mg Rectal Q6H PRN Fuller Plan A, MD      . alum & mag hydroxide-simeth (MAALOX/MYLANTA) 200-200-20 MG/5ML suspension 30 mL  30 mL Oral Q6H PRN Michael Boston, MD      . cefTRIAXone (ROCEPHIN) 1 g in dextrose 5 % 50 mL IVPB  1 g Intravenous Q24H Erenest Blank, RPH   Stopped at 10/07/16  2235  . finasteride (PROSCAR) tablet 5 mg  5 mg Oral Daily Tamala Julian, Rondell A, MD   5 mg at 10/08/16 0916  . heparin injection 5,000 Units  5,000 Units Subcutaneous Q8H Fuller Plan A, MD   5,000 Units at 10/08/16 0618  . ipratropium-albuterol (DUONEB) 0.5-2.5 (3) MG/3ML nebulizer solution 3 mL  3 mL Nebulization Q4H PRN Smith, Rondell A, MD      . lip balm (BLISTEX) ointment 1 application  1 application Topical BID Michael Boston, MD   1 application at 51/02/58 1147  . magic mouthwash  15 mL Oral QID PRN Michael Boston, MD      . menthol-cetylpyridinium (CEPACOL) lozenge 3 mg  1 lozenge Oral PRN Michael Boston, MD      . methocarbamol (ROBAXIN) tablet 750 mg  750 mg Oral QID PRN Fuller Plan A, MD      . mirtazapine (REMERON SOL-TAB) disintegrating tablet 15 mg  15 mg Oral QHS Smith, Rondell A, MD   15 mg at 10/07/16 2205  . nitroGLYCERIN (NITROSTAT) SL tablet 0.4 mg  0.4 mg Sublingual Q5 min PRN Fuller Plan A, MD      . ondansetron (ZOFRAN) tablet 4 mg  4 mg Oral Q6H PRN Fuller Plan A, MD       Or  . ondansetron (ZOFRAN) injection 4 mg  4 mg Intravenous Q6H PRN Smith, Rondell A, MD      . oxyCODONE (Oxy IR/ROXICODONE) immediate release tablet 5-10 mg  5-10 mg Oral Q4H PRN Fuller Plan A, MD   10 mg at 10/08/16 0916  . pantoprazole (PROTONIX) EC tablet 40 mg  40 mg Oral Daily Fuller Plan A, MD   40 mg at 10/08/16 0916  . phenol (CHLORASEPTIC) mouth spray 2 spray  2 spray Mouth/Throat PRN Michael Boston, MD      . pravastatin (PRAVACHOL) tablet 20 mg  20 mg Oral q1800 Fuller Plan A, MD   20 mg at 10/07/16 1841  . tamsulosin (FLOMAX) capsule 0.4 mg  0.4 mg Oral Daily Tamala Julian, Rondell A, MD   0.4 mg at 10/08/16 0916  . traZODone (DESYREL) tablet 300 mg  300 mg Oral QHS Fuller Plan A, MD   300 mg at 10/08/16 0010      Psychiatric Specialty Exam: Physical Exam  HENT:  Head: Normocephalic.    Review of Systems  Cardiovascular: Negative for chest pain.  Psychiatric/Behavioral:  Negative for suicidal ideas.    Blood pressure 117/65, pulse 70, temperature 97.7 F (36.5 C), temperature source Oral, resp. rate 18, weight 96.6 kg (212 lb 15.4 oz), SpO2 100 %.Body mass index is 31.45 kg/m.  General Appearance: Casual  Eye Contact:  Fair  Speech:  Slow  Volume:  Decreased  Mood:  Euthymic  Affect:  Congruent  Thought Process:  Goal Directed  Orientation:  Full (Time, Place, and Person)  Thought Content:  Rumination  Suicidal Thoughts:  No  Homicidal Thoughts:  No  Memory:  Immediate;   Fair Recent;   Fair  Judgement:  Fair  Insight:  Fair  Psychomotor Activity:  Decreased  Concentration:  Concentration: Fair and Attention Span: Fair  Recall:  [employer] of Knowledge:  Fair  Language:  Fair  Akathisia:  Negative  Handed:  Right  AIMS (if indicated):     Assets:  Desire for Improvement  ADL's:  Intact  Cognition:  WNL  Sleep:        Treatment Plan Summary: Daily contact with patient to assess and evaluate symptoms and progress in treatment and Medication management  Can continue remeron 44m for now.  Can remove sitter by this evening.  Continue to provide supportive therapy   Disposition: Patient does not meet criteria for psychiatric inpatient admission.  Patient can follow outpatient psychiatry services or with assisted living facility once when medically cleared   AMerian Capron MD 10/08/2016 1:49 PM

## 2016-10-08 NOTE — Progress Notes (Signed)
Physical Therapy Treatment Patient Details Name: Patrick [name] MRN: [id number] DOB: 04-02-1951 Today's Date: 10/08/2016    History of Present Illness Pt is a 66 yo male admitted through ED on 10/06/16 with increased LE and rectal pain. Upon admission, pt was found to have ARF, Hyperkalemia, Lower UTI. Pt is s/p robotic APR/colostomy on 09/28/16 for adenocarcinoma of the rectum. PMH significant for HTN, CHF, OA, CAD, DJD, dysrhythmia and SBO.      PT Comments    Pt requires increased assistance to perform stand pivot transfer and sit to stand this session. Performed bed mobs with decreased assistance this session. Pt has increased c/o pain with mobility throughout knees and ankles with transfer and deferred any attempt at gait training this session. Pt was positioned in chair with decreased discomfort reports in bottom. Pt will benefit from continued acute services to address continued deficits with transfers and gait.    Follow Up Recommendations  SNF;Supervision/Assistance - 24 hour     Equipment Recommendations  None recommended by PT    Recommendations for Other Services       Precautions / Restrictions Precautions Precautions: Fall Precaution Comments: pt on suicide watch and has sitter in the room Restrictions Weight Bearing Restrictions: No    Mobility  Bed Mobility Overal bed mobility: Needs Assistance Bed Mobility: Supine to Sit     Supine to sit: Mod assist;HOB elevated     General bed mobility comments: Mod A to bring LE's EOB and to bring trunk upright at EOB  Transfers Overall transfer level: Needs assistance Equipment used: Rolling walker (2 wheeled) Transfers: Sit to/from [facility] Sit to Stand: Mod assist;+2 physical assistance Stand pivot transfers: Mod assist;+2 physical assistance       General transfer comment: Mod to max A +2 to stand and Mod A +2 throughout transfer.   Ambulation/Gait             General Gait Details: unable  to attempt this session   Stairs            Wheelchair Mobility    Modified Rankin (Stroke Patients Only)       Balance Overall balance assessment: Needs assistance Sitting-balance support: No upper extremity supported;Feet supported Sitting balance-Leahy Scale: Good     Standing balance support: Bilateral upper extremity supported Standing balance-Leahy Scale: Poor Standing balance comment: reliant on RW for stability in standing                            Cognition Arousal/Alertness: Awake/alert Behavior During Therapy: WFL for tasks assessed/performed Overall Cognitive Status: Within Functional Limits for tasks assessed                                        Exercises      General Comments General comments (skin integrity, edema, etc.): OT assisted with sit to stand and stand pivot transfer      Pertinent Vitals/Pain Pain Assessment: 0-10 Pain Score: 8  Pain Location: ankles and knees with mobility Pain Descriptors / Indicators: Aching;Grimacing;Guarding;Moaning;Sharp Pain Intervention(s): Monitored during session;Repositioned    Home Living                      Prior Function            PT Goals (current goals can now be found in  the care plan section) Acute Rehab PT Goals Patient Stated Goal: get back to himself Progress towards PT goals: Progressing toward goals    Frequency    Min 2X/week      PT Plan Frequency needs to be updated    Co-evaluation PT/OT/SLP Co-Evaluation/Treatment: Yes Reason for Co-Treatment: For patient/therapist safety;To address functional/ADL transfers PT goals addressed during session: Mobility/safety with mobility        AM-PAC PT "6 Clicks" Daily Activity  Outcome Measure  Difficulty turning over in bed (including adjusting bedclothes, sheets and blankets)?: Total Difficulty moving from lying on back to sitting on the side of the bed? : Total Difficulty sitting down  on and standing up from a chair with arms (e.g., wheelchair, bedside commode, etc,.)?: A Lot Help needed moving to and from a bed to chair (including a wheelchair)?: A Lot Help needed walking in hospital room?: A Lot Help needed climbing 3-5 steps with a railing? : Total 6 Click Score: 9    End of Session Equipment Utilized During Treatment: Gait belt Activity Tolerance: Patient limited by pain Patient left: in chair;with call bell/phone within reach;with nursing/sitter in room Nurse Communication: Mobility status PT Visit Diagnosis: Unsteadiness on feet (R26.81);Difficulty in walking, not elsewhere classified (R26.2);Muscle weakness (generalized) (M62.81)     Time: 5859-2924 PT Time Calculation (min) (ACUTE ONLY): 33 min  Charges:  $Therapeutic Activity: 23-37 mins                    G Codes:       Scheryl Marten PT, DPT  [phone]    Jacqulyn Liner Sloan Leiter 10/08/2016, 10:38 AM

## 2016-10-08 NOTE — Progress Notes (Addendum)
Patient ID: Patrick [name], male   DOB: 12/31/1950, 66 y.o.   MRN: [id number]  PROGRESS NOTE    Patrick [name]  [facility]:[id number] DOB: 1950/10/10 DOA: 10/06/2016  PCP: Sela Hua, MD   Brief Narrative:  66 year old male with past medical history of rectal adenocarcinoma with permanent colostomy (09/28/2016), on chemo/radiation, CAD, CHF (EF 30-35% with grade 2 DD). Pt presented with right knee sharp, stabbing pain to the point he required assistance with ambulation. He also had subjective fevers, dark stool (he is on iron supplementation). He also reported suicidal ideations.  In ED, he was hemodynamically stable. Blood work was notable for Cr of 4.19 (recent baseline 1.3), potassium was 5.4 (given kayexalate). No acute findings on x ray of the knee. UA showed bacteria. Started on IV rocephin while awaiting urine cx results. Sitter at bedside for suicidal precautions.   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) - Holding torsemide, metolazone, losartan due to AKI    Hyperkalemia - Corrected with kayexalate    Adenocarcinoma of rectum s/p robotic APR/colostomy 09/28/2016 - Management per oncology    Thrombocytopenia ([place]) - Due to sequela of chemotherapy    Acute lower UTI - Continue Rocephin    Anemia of chronic disease - Due to CKD and malignancy - Hgb 8.9 this am    Suicidal ideation - Cleared by psych - D/C sitter at bedside    DVT prophylaxis: Heparin subQ Code Status: full code  Family Communication: sitter at bedside this am Disposition Plan: to SNF in next 24 hours    Consultants:   None   Procedures:    RLE doppler 10/07/2016 - no LE DVT  Antimicrobials:   Rocephin 5/10 -->   Subjective: Feels better.  Objective: Vitals:   10/07/16 2157 10/08/16 0456 10/08/16 0621 10/08/16 0956  BP: (!) 108/56  (!) 97/56 117/65  Pulse: 74  88 70  Resp: 18  18 18   Temp: 97.1 F (36.2 C)  98 F (36.7 C) 97.7 F (36.5 C)  TempSrc: Oral  Oral Oral  SpO2:   91% 100%  Weight: 96.6 kg (213 lb) 96.6 kg (212 lb 15.4 oz)      Intake/Output Summary (Last 24 hours) at 10/08/16 1653 Last data filed at 10/08/16 1500  Gross per 24 hour  Intake          3343.75 ml  Output             4575 ml  Net         -1231.25 ml   Filed Weights   10/07/16 0337 10/07/16 2157 10/08/16 0456  Weight: 96 kg (211 lb 10.3 oz) 96.6 kg (213 lb) 96.6 kg (212 lb 15.4 oz)    Examination:  Physical Exam  Constitutional: Appears well-developed and well-nourished. No distress.  Eyes: Conjunctivae and EOM are normal. PERRLA, no scleral icterus.  CVS: RRR, S1/S2 +, no murmurs, no gallops, no carotid bruit.  Pulmonary: Effort and breath sounds normal, no stridor, rhonchi, wheezes, rales.  Abdominal: Soft. BS +,  no distension, tenderness, rebound or  guarding.  Musculoskeletal: Normal range of motion. No edema and no tenderness.  Neuro: Alert. Normal reflexes, muscle tone coordination. No cranial nerve deficit. Skin: Skin is warm and dry. No rash noted. Not diaphoretic. No erythema. No pallor.  Psychiatric: Normal mood and affect. Behavior, judgment, thought content normal.     Data Reviewed: I have personally reviewed following labs and imaging studies  CBC:  Recent Labs Lab 10/02/16 0432 10/06/16 1819 10/07/16 0637  WBC 8.5 9.7 8.3  NEUTROABS  --  7.5  --   HGB 10.3* 9.4* 8.9*  HCT 31.1* 29.0* 27.3*  MCV 86.4 84.8 85.8  PLT 97* 106* 025*   Basic Metabolic Panel:  Recent Labs Lab 10/02/16 0432 10/06/16 10/06/16 1819 10/07/16 0637  NA  --  139 135 136  K 3.7 5.7* 5.4* 4.4  CL  --   --  102 100*  CO2   --   --  22 21*  GLUCOSE  --   --  86 91  BUN  --  62* 69* 69*  CREATININE 1.28* 3.9* 4.19* 4.10*  CALCIUM  --   --  8.0* 7.7*   GFR: Estimated Creatinine Clearance: 19.8 mL/min (A) (by C-G formula based on SCr of 4.1 mg/dL (H)). Liver Function Tests:  Recent Labs Lab 10/06/16 1819  AST 21  ALT 18  ALKPHOS 65  BILITOT 1.1  PROT 6.0*  ALBUMIN 2.8*   No results for input(s): LIPASE, AMYLASE in the last 168 hours. No results for input(s): AMMONIA in the last 168 hours. Coagulation Profile: No results for input(s): INR, PROTIME in the last 168 hours. Cardiac Enzymes: No results for input(s): CKTOTAL, CKMB, CKMBINDEX, TROPONINI in the last 168 hours. BNP (last 3 results) No results for input(s): PROBNP in the last 8760 hours. HbA1C: No results for input(s): HGBA1C in the last 72 hours. CBG: No results for input(s): GLUCAP in the last 168 hours. Lipid Profile: No results for input(s): CHOL, HDL, LDLCALC, TRIG, CHOLHDL, LDLDIRECT in the last 72 hours. Thyroid Function Tests: No results for input(s): TSH, T4TOTAL, FREET4, T3FREE, THYROIDAB in the last 72 hours. Anemia Panel: No results for input(s): VITAMINB12, FOLATE, FERRITIN, TIBC, IRON, RETICCTPCT in the last 72 hours. Urine analysis:    Component Value Date/Time   COLORURINE YELLOW 10/06/2016 2135   APPEARANCEUR CLOUDY (A) 10/06/2016 2135   LABSPEC 1.009 10/06/2016 2135   PHURINE 5.0 10/06/2016 2135   GLUCOSEU NEGATIVE 10/06/2016 2135   HGBUR SMALL (A) 10/06/2016 2135   HGBUR negative 12/26/2008 1541   [place] 10/06/2016 2135   [place] 10/06/2016 2135   PROTEINUR 30 (A) 10/06/2016 2135   UROBILINOGEN 1.0 04/03/2015 1725   NITRITE NEGATIVE 10/06/2016 2135   LEUKOCYTESUR LARGE (A) 10/06/2016 2135   Sepsis Labs: @LABRCNTIP (procalcitonin:4,lacticidven:4)   Recent Results (from the past 240 hour(s))  Urine culture     Status: Abnormal (Preliminary result)   Collection Time: 10/06/16  9:35  PM  Result Value Ref Range Status   Specimen Description URINE, RANDOM  Final   Special Requests NONE  Final   Culture (A)  Final    >=100,000 COLONIES/mL ENTEROCOCCUS FAECALIS SUSCEPTIBILITIES TO FOLLOW    Report Status PENDING  Incomplete      Radiology Studies: Dg Chest 2 View  Result Date: 10/06/2016 CLINICAL DATA:  Chest pain. EXAM: CHEST  2 VIEW COMPARISON:  Abdominal radiograph 05/29/2016 FINDINGS: Cardiomegaly, with prominence of the epicardial fat pad. No evidence pulmonary edema. Mild central bronchial thickening. No consolidation, pleural effusion  or pneumothorax. No acute osseous abnormalities. IMPRESSION: Cardiomegaly without acute abnormality. Electronically Signed   By: Jeb Levering M.D.   On: 10/06/2016 21:04   Dg Knee 2 Views Right  Result Date: 10/06/2016 CLINICAL DATA:  Knee pain EXAM: RIGHT KNEE - 1-2 VIEW COMPARISON:  None. FINDINGS: The bones are osteopenic. There is medial compartment predominant narrowing of the femorotibial joint space, though this may be exaggerated by positioning. No fracture or dislocation. Narrowing of the patellofemoral joint space with superior patellar enthesophyte. IMPRESSION: Mild right knee osteoarthrosis without acute osseous abnormality. Electronically Signed   By: Ulyses Jarred M.D.   On: 10/06/2016 19:47   US Renal  Result Date: 10/06/2016 CLINICAL DATA:  Acute renal failure. EXAM: RENAL / URINARY TRACT ULTRASOUND COMPLETE COMPARISON:  MRI abdomen 04/17/2016. CT abdomen and pelvis 04/04/2016. FINDINGS: Right Kidney: Length: 13.3 cm. Multiple small cysts are demonstrated, mostly in the parapelvic region of the right kidney. Largest measures about 1.4 cm in diameter. Appearance is likely represent benign cysts. Similar changes were seen on previous MRI. Mild renal parenchymal thinning. No hydronephrosis. Left Kidney: Length: 13.5 cm. Mild renal parenchymal thinning. No hydronephrosis. Small simple appearing cyst demonstrated in the mid  pole. Bladder: Appears normal for degree of bladder distention. Bladder is moderately distended with a prevoid volume of 1.3 L. Patient is unable to void. IMPRESSION: Small bilateral renal cysts with mild parenchymal thinning. No hydronephrosis. Bladder is distended and patient is unable to void. Electronically Signed   By: Lucienne Capers M.D.   On: 10/06/2016 23:17   Dg Foot 2 Views Right  Result Date: 10/06/2016 CLINICAL DATA:  Right foot pain EXAM: RIGHT FOOT - 2 VIEW COMPARISON:  None. FINDINGS: No fracture. No subluxation or dislocation. Degenerative changes noted in the great toe MTP joint with associated hallux valgus deformity. IMPRESSION: Degenerative changes in the great toe.  No acute bony abnormality. Electronically Signed   By: Misty Stanley M.D.   On: 10/06/2016 19:41    Scheduled Meds: . finasteride  5 mg Oral Daily  . heparin  5,000 Units Subcutaneous Q8H  . lip balm  1 application Topical BID  . mirtazapine  15 mg Oral QHS  . pantoprazole  40 mg Oral Daily  . pravastatin  20 mg Oral q1800  . tamsulosin  0.4 mg Oral Daily  . traZODone  300 mg Oral QHS   Continuous Infusions: . sodium chloride 75 mL/hr at 10/08/16 0618  . cefTRIAXone (ROCEPHIN)  IV Stopped (10/07/16 2235)     LOS: 2 days    Time spent: 15 minutes Greater than 50% of the time spent on counseling and coordinating the care.   Leisa Lenz, MD [facility] Pager [phone]  If 7PM-7AM, please contact night-coverage [email or web address] Password [facility] 10/08/2016, 4:53 PM

## 2016-10-09 ENCOUNTER — Encounter (HOSPITAL_COMMUNITY): Payer: Self-pay | Admitting: *Deleted

## 2016-10-09 DIAGNOSIS — N390 Urinary tract infection, site not specified: Secondary | ICD-10-CM

## 2016-10-09 DIAGNOSIS — R748 Abnormal levels of other serum enzymes: Secondary | ICD-10-CM

## 2016-10-09 LAB — URINE CULTURE

## 2016-10-09 LAB — BASIC METABOLIC PANEL
Anion gap: 10 (ref 5–15)
BUN: 35 mg/dL — AB (ref 6–20)
CALCIUM: 8.2 mg/dL — AB (ref 8.9–10.3)
CO2: 24 mmol/L (ref 22–32)
CREATININE: 2.23 mg/dL — AB (ref 0.61–1.24)
Chloride: 109 mmol/L (ref 101–111)
GFR calc non Af Amer: 29 mL/min — ABNORMAL LOW (ref >60)
GFR, EST AFRICAN AMERICAN: 33 mL/min — AB (ref >60)
Glucose, Bld: 133 mg/dL — ABNORMAL HIGH (ref 65–99)
Potassium: 3.3 mmol/L — ABNORMAL LOW (ref 3.5–5.1)
SODIUM: 143 mmol/L (ref 135–145)

## 2016-10-09 LAB — CBC
HCT: 28.3 % — ABNORMAL LOW (ref 39.0–52.0)
Hemoglobin: 9.3 g/dL — ABNORMAL LOW (ref 13.0–17.0)
MCH: 28.9 pg (ref 26.0–34.0)
MCHC: 32.9 g/dL (ref 30.0–36.0)
MCV: 87.9 fL (ref 78.0–100.0)
PLATELETS: 93 10*3/uL — AB (ref 150–400)
RBC: 3.22 MIL/uL — AB (ref 4.22–5.81)
RDW: 16.4 % — ABNORMAL HIGH (ref 11.5–15.5)
WBC: 5.9 10*3/uL (ref 4.0–10.5)

## 2016-10-09 LAB — TROPONIN I
TROPONIN I: 0.07 ng/mL — AB (ref <0.03)
Troponin I: 0.05 ng/mL (ref <0.03)

## 2016-10-09 MED ORDER — SODIUM CHLORIDE 0.9 % IV SOLN
1.0000 g | Freq: Four times a day (QID) | INTRAVENOUS | Status: DC
  Administered 2016-10-09 – 2016-10-10 (×4): 1 g via INTRAVENOUS
  Filled 2016-10-09 (×5): qty 1000

## 2016-10-09 MED ORDER — POTASSIUM CHLORIDE CRYS ER 20 MEQ PO TBCR
40.0000 meq | EXTENDED_RELEASE_TABLET | Freq: Once | ORAL | Status: AC
  Administered 2016-10-09: 40 meq via ORAL
  Filled 2016-10-09: qty 2

## 2016-10-09 NOTE — Progress Notes (Signed)
Patient ID: Patrick [name], male   DOB: 1951-04-12, 66 y.o.   MRN: [id number]  PROGRESS NOTE    Patrick [name]  HUD:[id number] DOB: [birth year] DOA: 10/06/2016  PCP: Patrick Hua, MD   Brief Narrative:  66 year old male with past medical history of rectal adenocarcinoma with permanent colostomy (09/28/2016), on chemo/radiation, CAD, CHF (EF 30-35% with grade 2 DD). Pt presented with right knee sharp, stabbing pain to the point he required assistance with ambulation. He also had subjective fevers, dark stool (he is on iron supplementation). He also reported suicidal ideations.  In ED, he was hemodynamically stable. Blood work was notable for Cr of 4.19 (recent baseline 1.3), potassium was 5.4 (given kayexalate). No acute findings on x ray of the knee. UA showed bacteria. Started on IV rocephin while awaiting urine cx results.  Patient was seen by psychiatry who determined patient does not require inpatient psychiatric treatment. Urine cx is growing VRE and blood cx are still pending as of 5/13.   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) - Holding torsemide, metolazone, losartan due to AKI    Hyperkalemia / Hypokalemia  - Given kayexalate and now hypokalemia - Supplemented and will check BMP in am    Adenocarcinoma of rectum s/p robotic APR/colostomy 09/28/2016 - Management per oncology    Thrombocytopenia (Green) - Due to sequela of chemotherapy - Platelets 93 this am -  Follow up CBC In am    Acute lower UTI / VRE UTI - Pt on Rocephin  - Bloo cx pending     Anemia of chronic disease - Due to CKD and malignancy - Hgb stable at 9.3    Suicidal ideation - D/C'ed sitter 5/12 - Stable     Hypokalemia - Supplemented - Follow up BMP in am   DVT prophylaxis: Heparin subQ Code Status: full code  Family Communication: no family at the bedside  Disposition Plan: awaiting blood cx results    Consultants:   Psychiatry   Procedures:   RLE doppler negative for DVT  Antimicrobials:   Rocephin 10/06/2016 -->    Subjective: No overnight events. He feels better.  Objective: Vitals:   10/08/16 2020 10/09/16 0147 10/09/16 0220 10/09/16 0426  BP: (!) 145/65  (!) 147/66 137/62  Pulse: 84  92 97  Resp: 19  17 19   Temp: 98.5 F (36.9 C)  (!) 102.4 F (39.1 C) 98.9 F (37.2 C)  TempSrc: Oral  Oral Oral  SpO2: 97%  94% 98%  Weight: 97.1 kg (214 lb) 97.1 kg (214 lb 1.1 oz)    Height:        Intake/Output Summary (Last 24 hours) at 10/09/16 1108 Last data filed at 10/09/16 0600  Gross per 24 hour  Intake          2568.75 ml  Output  6125 ml  Net         -3556.25 ml   Filed Weights   10/08/16 0956 10/08/16 2020 10/09/16 0147  Weight: 96.6 kg (212 lb 15.4 oz) 97.1 kg (214 lb) 97.1 kg (214 lb 1.1 oz)    Examination:  General exam: Appears calm and comfortable  Respiratory system: Clear to auscultation. Respiratory effort normal. Cardiovascular system: S1 & S2 heard, Rate controlled  Gastrointestinal system: Abdomen is nondistended, soft and nontender. No organomegaly or masses felt. Normal bowel sounds heard. Central nervous system: Alert and oriented. No focal neurological deficits. Extremities: Symmetric 5 x 5 power. Skin: No rashes, lesions or ulcers Psychiatry: Judgement and insight appear normal. Mood & affect appropriate.   Data Reviewed: I have personally reviewed following labs and imaging  studies  CBC:  Recent Labs Lab 10/06/16 1819 10/07/16 0637 10/09/16 0946  WBC 9.7 8.3 5.9  NEUTROABS 7.5  --   --   HGB 9.4* 8.9* 9.3*  HCT 29.0* 27.3* 28.3*  MCV 84.8 85.8 87.9  PLT 106* 104* 93*   Basic Metabolic Panel:  Recent Labs Lab 10/06/16 10/06/16 1819 10/07/16 0637 10/08/16 1705 10/09/16 0946  NA 139 135 136 139 143  K 5.7* 5.4* 4.4 3.3* 3.3*  CL  --  102 100* 103 109  CO2  --  22 21* 27 24  GLUCOSE  --  86 91 119* 133*  BUN 62* 69* 69* 46* 35*  CREATININE 3.9* 4.19* 4.10* 2.82* 2.23*  CALCIUM  --  8.0* 7.7* 8.0* 8.2*   GFR: Estimated Creatinine Clearance: 36.5 mL/min (A) (by C-G formula based on SCr of 2.23 mg/dL (H)). Liver Function Tests:  Recent Labs Lab 10/06/16 1819  AST 21  ALT 18  ALKPHOS 65  BILITOT 1.1  PROT 6.0*  ALBUMIN 2.8*   No results for input(s): LIPASE, AMYLASE in the last 168 hours. No results for input(s): AMMONIA in the last 168 hours. Coagulation Profile: No results for input(s): INR, PROTIME in the last 168 hours. Cardiac Enzymes:  Recent Labs Lab 10/08/16 2100 10/09/16 0244 10/09/16 0946  TROPONINI 0.05* 0.07* 0.05*   BNP (last 3 results) No results for input(s): PROBNP in the last 8760 hours. HbA1C: No results for input(s): HGBA1C in the last 72 hours. CBG: No results for input(s): GLUCAP in the last 168 hours. Lipid Profile: No results for input(s): CHOL, HDL, LDLCALC, TRIG, CHOLHDL, LDLDIRECT in the last 72 hours. Thyroid Function Tests: No results for input(s): TSH, T4TOTAL, FREET4, T3FREE, THYROIDAB in the last 72 hours. Anemia Panel: No results for input(s): VITAMINB12, FOLATE, FERRITIN, TIBC, IRON, RETICCTPCT in the last 72 hours. Urine analysis:    Component Value Date/Time   COLORURINE YELLOW 10/06/2016 2135   APPEARANCEUR CLOUDY (A) 10/06/2016 2135   LABSPEC 1.009 10/06/2016 2135   PHURINE 5.0 10/06/2016 2135   GLUCOSEU NEGATIVE 10/06/2016 2135   HGBUR SMALL (A) 10/06/2016 2135   HGBUR  negative 12/26/2008 1541   [place] 10/06/2016 2135   [place] 10/06/2016 2135   PROTEINUR 30 (A) 10/06/2016 2135   UROBILINOGEN 1.0 04/03/2015 1725   NITRITE NEGATIVE 10/06/2016 2135   LEUKOCYTESUR LARGE (A) 10/06/2016 2135   Sepsis Labs: @LABRCNTIP (procalcitonin:4,lacticidven:4)   ) Recent Results (from the past 240 hour(s))  Urine culture     Status: Abnormal   Collection Time: 10/06/16  9:35 PM  Result Value Ref Range Status   Specimen Description URINE, RANDOM  Final   Special Requests NONE  Final   Culture (A)  Final    >=100,000 COLONIES/mL VANCOMYCIN RESISTANT ENTEROCOCCUS   Report Status 10/09/2016 FINAL  Final   Organism ID, Bacteria VANCOMYCIN RESISTANT ENTEROCOCCUS (A)  Final      Susceptibility   Vancomycin resistant enterococcus - MIC*    AMPICILLIN <=2 SENSITIVE Sensitive     LEVOFLOXACIN >=8 RESISTANT Resistant     NITROFURANTOIN <=16 SENSITIVE Sensitive     VANCOMYCIN >=32 RESISTANT Resistant     LINEZOLID 2 SENSITIVE Sensitive     * >=100,000 COLONIES/mL VANCOMYCIN RESISTANT ENTEROCOCCUS      Radiology Studies: Dg Chest 2 View  Result Date: 10/06/2016 CLINICAL DATA:  Chest pain. EXAM: CHEST  2 VIEW COMPARISON:  Abdominal radiograph 05/29/2016 FINDINGS: Cardiomegaly, with prominence of the epicardial fat pad. No evidence pulmonary edema. Mild central bronchial thickening. No consolidation, pleural effusion or pneumothorax. No acute osseous abnormalities. IMPRESSION: Cardiomegaly without acute abnormality. Electronically Signed   By: Jeb Levering M.D.   On: 10/06/2016 21:04   Dg Knee 2 Views Right  Result Date: 10/06/2016 CLINICAL DATA:  Knee pain EXAM: RIGHT KNEE - 1-2 VIEW COMPARISON:  None. FINDINGS: The bones are osteopenic. There is medial compartment predominant narrowing of the femorotibial joint space, though this may be exaggerated by positioning. No fracture or dislocation. Narrowing of the patellofemoral joint space with  superior patellar enthesophyte. IMPRESSION: Mild right knee osteoarthrosis without acute osseous abnormality. Electronically Signed   By: Ulyses Jarred M.D.   On: 10/06/2016 19:47   US Renal  Result Date: 10/06/2016 CLINICAL DATA:  Acute renal failure. EXAM: RENAL / URINARY TRACT ULTRASOUND COMPLETE COMPARISON:  MRI abdomen 04/17/2016. CT abdomen and pelvis 04/04/2016. FINDINGS: Right Kidney: Length: 13.3 cm. Multiple small cysts are demonstrated, mostly in the parapelvic region of the right kidney. Largest measures about 1.4 cm in diameter. Appearance is likely represent benign cysts. Similar changes were seen on previous MRI. Mild renal parenchymal thinning. No hydronephrosis. Left Kidney: Length: 13.5 cm. Mild renal parenchymal thinning. No hydronephrosis. Small simple appearing cyst demonstrated in the mid pole. Bladder: Appears normal for degree of bladder distention. Bladder is moderately distended with a prevoid volume of 1.3 L. Patient is unable to void. IMPRESSION: Small bilateral renal cysts with mild parenchymal thinning. No hydronephrosis. Bladder is distended and patient is unable to void. Electronically Signed   By: Lucienne Capers M.D.   On: 10/06/2016 23:17   Dg Chest Port 1 View  Result Date: 10/08/2016 CLINICAL DATA:  Acute chest pain. EXAM: PORTABLE CHEST 1 VIEW COMPARISON:  10/06/2016 and prior exams FINDINGS: Cardiomegaly noted. There is no evidence of focal airspace disease, pulmonary edema, suspicious pulmonary nodule/mass, pleural effusion, or pneumothorax. No acute bony abnormalities are identified. IMPRESSION: Cardiomegaly without evidence of acute cardiopulmonary disease. Electronically Signed   By: Margarette Canada M.D.   On: 10/08/2016 21:16   Dg Foot 2 Views Right  Result Date: 10/06/2016 CLINICAL DATA:  Right foot pain EXAM: RIGHT FOOT - 2 VIEW COMPARISON:  None. FINDINGS: No fracture. No subluxation or dislocation. Degenerative changes noted in the great toe MTP joint with  associated hallux valgus deformity. IMPRESSION: Degenerative changes in the great toe.  No acute bony abnormality. Electronically Signed   By: Misty Stanley M.D.   On: 10/06/2016 19:41        Scheduled Meds: . aspirin EC  81 mg Oral Daily  . finasteride  5 mg Oral Daily  . heparin  5,000 Units Subcutaneous Q8H  . lip balm  1 application Topical BID  .  mirtazapine  15 mg Oral QHS  . pantoprazole  40 mg Oral Daily  . potassium chloride  40 mEq Oral Once  . pravastatin  20 mg Oral q1800  . tamsulosin  0.4 mg Oral Daily  . traZODone  300 mg Oral QHS   Continuous Infusions: . sodium chloride 75 mL/hr at 10/09/16 0920  . cefTRIAXone (ROCEPHIN)  IV Stopped (10/08/16 2201)     LOS: 3 days    Time spent: 25 minutes  Greater than 50% of the time spent on counseling and coordinating the care.   Leisa Lenz, MD [facility] Pager [phone]  If 7PM-7AM, please contact night-coverage [email or web address] Password [email or web address] 10/09/2016, 11:08 AM

## 2016-10-09 NOTE — Progress Notes (Signed)
At approximately 2000, Tele called and advised that patient had a 25 beat run of [facility].  Otherwise, rhythm is AFIB with Bundle Branch Block.  Upon assessing the patient, he is sitting in the chair.  He is complaining of mid sternal chest pressure.  He states this pressure has happened intermittently during the day.  Upon trying to get the patient back to bed, he is unable to stand with 2 person assist and walker.  We used Maxi Move to assist patient back to bed.  His VS at 2020 was 98.5, 145/65, 97 RA.  MD made aware.  Stat EKG, Stat Portable Chest Xray, and Troponin Q 6 hours x 3 ordered.  1st troponin was 0.05.  MD made aware and 81 mg ASA ordered and given to patient.  AT 0220, patient's temp was 102.4.  MD made aware.  BC x 2 ordered.  650 mg Tylenol PO given at 0317.  At 0426, temperature is now 98.9.  Currently, patient is not complaining of chest pressure.  His second troponin is 0.07.  VS remain stable.  Will continue to monitor patient.  Earleen Reaper RN-BC, [employer]

## 2016-10-10 DIAGNOSIS — R7881 Bacteremia: Secondary | ICD-10-CM

## 2016-10-10 DIAGNOSIS — E785 Hyperlipidemia, unspecified: Secondary | ICD-10-CM | POA: Diagnosis not present

## 2016-10-10 DIAGNOSIS — B952 Enterococcus as the cause of diseases classified elsewhere: Secondary | ICD-10-CM

## 2016-10-10 DIAGNOSIS — C20 Malignant neoplasm of rectum: Secondary | ICD-10-CM | POA: Diagnosis not present

## 2016-10-10 DIAGNOSIS — L03114 Cellulitis of left upper limb: Secondary | ICD-10-CM | POA: Diagnosis not present

## 2016-10-10 DIAGNOSIS — N179 Acute kidney failure, unspecified: Secondary | ICD-10-CM | POA: Diagnosis not present

## 2016-10-10 DIAGNOSIS — N189 Chronic kidney disease, unspecified: Secondary | ICD-10-CM | POA: Diagnosis not present

## 2016-10-10 DIAGNOSIS — N183 Chronic kidney disease, stage 3 (moderate): Secondary | ICD-10-CM | POA: Diagnosis not present

## 2016-10-10 DIAGNOSIS — I428 Other cardiomyopathies: Secondary | ICD-10-CM | POA: Diagnosis not present

## 2016-10-10 DIAGNOSIS — R5381 Other malaise: Secondary | ICD-10-CM | POA: Diagnosis not present

## 2016-10-10 DIAGNOSIS — I5022 Chronic systolic (congestive) heart failure: Secondary | ICD-10-CM | POA: Diagnosis not present

## 2016-10-10 DIAGNOSIS — D500 Iron deficiency anemia secondary to blood loss (chronic): Secondary | ICD-10-CM | POA: Diagnosis not present

## 2016-10-10 DIAGNOSIS — I25119 Atherosclerotic heart disease of native coronary artery with unspecified angina pectoris: Secondary | ICD-10-CM | POA: Diagnosis not present

## 2016-10-10 DIAGNOSIS — Z9049 Acquired absence of other specified parts of digestive tract: Secondary | ICD-10-CM | POA: Diagnosis not present

## 2016-10-10 DIAGNOSIS — M7032 Other bursitis of elbow, left elbow: Secondary | ICD-10-CM | POA: Diagnosis not present

## 2016-10-10 DIAGNOSIS — D649 Anemia, unspecified: Secondary | ICD-10-CM | POA: Diagnosis not present

## 2016-10-10 DIAGNOSIS — I10 Essential (primary) hypertension: Secondary | ICD-10-CM | POA: Diagnosis not present

## 2016-10-10 DIAGNOSIS — R600 Localized edema: Secondary | ICD-10-CM | POA: Diagnosis not present

## 2016-10-10 DIAGNOSIS — Z22322 Carrier or suspected carrier of Methicillin resistant Staphylococcus aureus: Secondary | ICD-10-CM

## 2016-10-10 DIAGNOSIS — R748 Abnormal levels of other serum enzymes: Secondary | ICD-10-CM | POA: Diagnosis not present

## 2016-10-10 DIAGNOSIS — N390 Urinary tract infection, site not specified: Secondary | ICD-10-CM | POA: Diagnosis not present

## 2016-10-10 DIAGNOSIS — N400 Enlarged prostate without lower urinary tract symptoms: Secondary | ICD-10-CM | POA: Diagnosis not present

## 2016-10-10 DIAGNOSIS — M10369 Gout due to renal impairment, unspecified knee: Secondary | ICD-10-CM | POA: Diagnosis not present

## 2016-10-10 DIAGNOSIS — I255 Ischemic cardiomyopathy: Secondary | ICD-10-CM | POA: Diagnosis not present

## 2016-10-10 DIAGNOSIS — Z1621 Resistance to vancomycin: Secondary | ICD-10-CM

## 2016-10-10 LAB — CBC
HCT: 28 % — ABNORMAL LOW (ref 39.0–52.0)
Hemoglobin: 9 g/dL — ABNORMAL LOW (ref 13.0–17.0)
MCH: 28.3 pg (ref 26.0–34.0)
MCHC: 32.1 g/dL (ref 30.0–36.0)
MCV: 88.1 fL (ref 78.0–100.0)
PLATELETS: 105 10*3/uL — AB (ref 150–400)
RBC: 3.18 MIL/uL — AB (ref 4.22–5.81)
RDW: 16.5 % — ABNORMAL HIGH (ref 11.5–15.5)
WBC: 8.1 10*3/uL (ref 4.0–10.5)

## 2016-10-10 LAB — BASIC METABOLIC PANEL
Anion gap: 11 (ref 5–15)
BUN: 21 mg/dL — ABNORMAL HIGH (ref 6–20)
CALCIUM: 8.2 mg/dL — AB (ref 8.9–10.3)
CO2: 23 mmol/L (ref 22–32)
CREATININE: 1.62 mg/dL — AB (ref 0.61–1.24)
Chloride: 108 mmol/L (ref 101–111)
GFR calc non Af Amer: 42 mL/min — ABNORMAL LOW (ref >60)
GFR, EST AFRICAN AMERICAN: 49 mL/min — AB (ref >60)
Glucose, Bld: 105 mg/dL — ABNORMAL HIGH (ref 65–99)
Potassium: 3.4 mmol/L — ABNORMAL LOW (ref 3.5–5.1)
SODIUM: 142 mmol/L (ref 135–145)

## 2016-10-10 MED ORDER — MIRTAZAPINE 15 MG PO TBDP: 15.0000 mg | ORAL_TABLET | Freq: Every day | ORAL | 0 refills | Status: DC

## 2016-10-10 MED ORDER — PANTOPRAZOLE SODIUM 40 MG PO TBEC: 40.0000 mg | DELAYED_RELEASE_TABLET | Freq: Every day | ORAL | 0 refills | Status: DC

## 2016-10-10 MED ORDER — POTASSIUM CHLORIDE CRYS ER 20 MEQ PO TBCR
40.0000 meq | EXTENDED_RELEASE_TABLET | Freq: Once | ORAL | Status: AC
  Administered 2016-10-10: 40 meq via ORAL
  Filled 2016-10-10: qty 2

## 2016-10-10 MED ORDER — ASPIRIN 81 MG PO TBEC: 81.0000 mg | DELAYED_RELEASE_TABLET | Freq: Every day | ORAL | 0 refills | Status: DC

## 2016-10-10 MED ORDER — IPRATROPIUM-ALBUTEROL 0.5-2.5 (3) MG/3ML IN SOLN
3.0000 mL | RESPIRATORY_TRACT | 0 refills | Status: DC | PRN
Start: 2016-10-10 — End: 2016-12-09

## 2016-10-10 MED ORDER — AMOXICILLIN 500 MG PO CAPS: 500.0000 mg | ORAL_CAPSULE | Freq: Three times a day (TID) | ORAL | 0 refills | Status: AC

## 2016-10-10 MED ORDER — POTASSIUM CHLORIDE CRYS ER 10 MEQ PO TBCR
EXTENDED_RELEASE_TABLET | ORAL | Status: AC
  Administered 2016-10-10: 40 meq via ORAL
  Filled 2016-10-10: qty 4

## 2016-10-10 MED ORDER — OXYCODONE HCL 5 MG PO TABS: 5.0000 mg | ORAL_TABLET | Freq: Four times a day (QID) | ORAL | 0 refills | Status: DC | PRN

## 2016-10-10 NOTE — Discharge Instructions (Signed)
Urinary Tract Infection, Adult °A urinary tract infection (UTI) is an infection of any part of the urinary tract. The urinary tract includes the: °· Kidneys. °· Ureters. °· Bladder. °· Urethra. °These organs make, store, and get rid of pee (urine) in the body. °Follow these instructions at home: °· Take over-the-counter and prescription medicines only as told by your doctor. °· If you were prescribed an antibiotic medicine, take it as told by your doctor. Do not stop taking the antibiotic even if you start to feel better. °· Avoid the following drinks: °¨ Alcohol. °¨ Caffeine. °¨ Tea. °¨ Carbonated drinks. °· Drink enough fluid to keep your pee clear or pale yellow. °· Keep all follow-up visits as told by your doctor. This is important. °· Make sure to: °¨ Empty your bladder often and completely. Do not to hold pee for long periods of time. °¨ Empty your bladder before and after sex. °¨ Wipe from front to back after a bowel movement if you are male. Use each tissue one time when you wipe. °Contact a doctor if: °· You have back pain. °· You have a fever. °· You feel sick to your stomach (nauseous). °· You throw up (vomit). °· Your symptoms do not get better after 3 days. °· Your symptoms go away and then come back. °Get help right away if: °· You have very bad back pain. °· You have very bad lower belly (abdominal) pain. °· You are throwing up and cannot keep down any medicines or water. °This information is not intended to replace advice given to you by your health care provider. Make sure you discuss any questions you have with your health care provider. °Document Released: 11/02/2007 Document Revised: 10/22/2015 Document Reviewed: 04/06/2015 °Elsevier Interactive Patient Education © 2017 [employer] ° °

## 2016-10-10 NOTE — Discharge Summary (Signed)
Physician Discharge Summary  Patrick [name]:[id number] DOB: Aug 02, 1950 DOA: 10/06/2016  PCP: Sela Hua, MD  Admit date: 10/06/2016 Discharge date: 10/10/2016  Recommendations for Outpatient Follow-up:  Resume torsemide as per prior to this admission Please continue to monitor renal function. When patient presented to hospital creatinine was acutely elevated at 4 and came down to 1.6 prior to discharge. We held losartan, torsemide, metolazone. We will continue to hold metolazone and losartan. Please continue to check creatinine at least once a week to make sure that it remains stable.  Continue amoxicillin for 7 days on discharge for VRE UTI.  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 Pt presented with right knee sharp, stabbing pain to the point he required assistance with ambulation. He also had subjective fevers, dark stool (he is on iron supplementation). He also reported suicidal ideations.  In ED, he was hemodynamically stable. Blood work was notable for Cr of 4.19 (recent baseline 1.3), potassium was 5.4 (given kayexalate). No acute findings on x ray of the knee. UA showed bacteria. Started on IV rocephin while awaiting urine cx results.  Patient was seen by psychiatry who determined patient does not require inpatient psychiatric treatment. Urine cx is growing VRE.  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 Also holding losartan because we want to make sure that renal function remained stable on discharge and after.   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) - Due to sequela of chemotherapy - Platelet count improved since the admission, 105 prior to discharge  Acute lower UTI / VRE UTI - Changed to Rocephin to ampicillin on 10/09/2016. - Changed ampicillin to amoxicillin on discharge for 7 days  Anemia of chronic disease - Due to CKD and malignancy - Stable  Suicidal ideation - D/C'ed sitter 5/12 - Stable     Hypokalemia - Likely because of diuretics even though we held diuretics on admission because of renal insufficiency - Potassium supplemented prior to discharge and patient already on potassium supplementation at home   DVT prophylaxis: Heparin subQ in hospital  Code Status: full code  Family Communication: no family at the bedside     Consultants:   Psychiatry   Procedures:   RLE doppler negative for DVT  Antimicrobials:   Rocephin 10/06/2016 --> 5/13  Ampicillin 10/09/2016 --> 10/10/2016  Signed:  Leisa Lenz, MD  [facility] 10/10/2016, 10:16 AM  Pager #: [phone]  Time spent in minutes: less than 30 minutes   Discharge  Exam: Vitals:   10/10/16 0424 10/10/16 0800  BP: (!) 143/66 140/81  Pulse: 92 77  Resp: 18 16  Temp: (!) 100.7 F (38.2 C) 98.4 F (36.9 C)   Vitals:   10/09/16 1555 10/09/16 2048 10/10/16 0424 10/10/16 0800  BP: (!) 142/58 138/68 (!) 143/66 140/81  Pulse: 97 90 92 77  Resp: 18 18 18 16   Temp: 98.1 F (36.7 C) 98.3 F (36.8 C) (!) 100.7 F (38.2 C) 98.4 F (36.9 C)  TempSrc: Oral Oral Oral Oral  SpO2: 100% 100% 100% 98%  Weight:  99.2 kg (218 lb 11.2 oz)    Height:        General: Pt is alert, follows commands appropriately, not in acute distress Cardiovascular: Regular rate and rhythm, S1/S2 + Respiratory: Clear to auscultation bilaterally, no wheezing, no crackles, no rhonchi Abdominal: Soft, non tender, non distended, bowel sounds +, no guarding Extremities: no cyanosis, pulses palpable bilaterally DP and PT Neuro: Grossly nonfocal  Discharge Instructions  Discharge Instructions    Diet - low sodium heart healthy    Complete by:  As directed    Discharge instructions    Complete by:  As directed    Resume torsemide as per prior to this admission Please continue to monitor renal function. When patient presented to hospital creatinine was acutely elevated at 4 and came down to 1.6 prior to discharge. We held losartan, torsemide, metolazone. We will continue to hold metolazone and losartan. Please continue to check creatinine at least once a week to make sure that it remains stable.  Continue amoxicillin for 7 days on discharge for VRE UTI.   Increase activity slowly    Complete by:  As directed      Allergies as of 10/10/2016      Reactions   Nsaids    Kidney disease      Medication List    STOP taking these medications   losartan 50 MG tablet Commonly known as:  COZAAR   metolazone 2.5 MG tablet Commonly known as:  ZAROXOLYN   oxyCODONE-acetaminophen 5-325 MG tablet Commonly known as:  PERCOCET/ROXICET     TAKE these medications   acetaminophen 500  MG tablet Commonly known as:  TYLENOL Take 1,000 mg by mouth every 6 (six) hours as needed (for pain/fever/headaches.).   amoxicillin 500 MG capsule Commonly known as:  AMOXIL Take 1 capsule (500 mg total) by mouth 3 (three) times daily.   aspirin 81 MG EC tablet Take 1 tablet (81 mg total) by mouth daily. Start taking on:  10/11/2016   carvedilol 25 MG tablet Commonly known as:  COREG Take 1 tablet (25 mg total) by mouth 2 (two) times daily.   ferrous sulfate 325 (65 FE) MG tablet take 1 tablet by mouth once daily WITH BREAKFAST   finasteride 5 MG tablet Commonly known as:  PROSCAR take 1 tablet by mouth once daily   ipratropium-albuterol 0.5-2.5 (3) MG/3ML Soln Commonly known as:  DUONEB Take 3 mLs by nebulization every 4 (four) hours as needed.   lovastatin 20 MG tablet Commonly known as:  MEVACOR take 1 tablet by mouth at bedtime   methocarbamol 750 MG tablet Commonly known as:  ROBAXIN Take 1 tablet (750 mg total) by mouth  4 (four) times daily as needed (use for muscle cramps/pain).   mirtazapine 15 MG disintegrating tablet Commonly known as:  REMERON SOL-TAB Take 1 tablet (15 mg total) by mouth at bedtime. What changed:  See the new instructions.   multivitamin with minerals tablet Take 1 tablet by mouth daily. Men's 50+ Multivitamin   nitroGLYCERIN 0.4 MG SL tablet Commonly known as:  NITROSTAT Place 1 tablet (0.4 mg total) under the tongue every 5 (five) minutes as needed for chest pain.   omega-3 acid ethyl esters 1 g capsule Commonly known as:  LOVAZA take 2 capsules by mouth twice a day   oxyCODONE 5 MG immediate release tablet Commonly known as:  Oxy IR/ROXICODONE Take 1-2 tablets (5-10 mg total) by mouth every 6 (six) hours as needed for moderate pain, severe pain or breakthrough pain.   pantoprazole 40 MG tablet Commonly known as:  PROTONIX Take 1 tablet (40 mg total) by mouth daily. Start taking on:  10/11/2016   potassium chloride SA 20 MEQ  tablet Commonly known as:  K-DUR,KLOR-CON Take 60 mEq by mouth daily.   tamsulosin 0.4 MG Caps capsule Commonly known as:  FLOMAX Take 0.4 mg by mouth daily.   torsemide 20 MG tablet Commonly known as:  DEMADEX Take 80 mg by mouth every morning.   torsemide 20 MG tablet Commonly known as:  DEMADEX Take 40 mg by mouth daily after supper.   traZODone 100 MG tablet Commonly known as:  DESYREL Take 300 mg by mouth at bedtime.   vitamin C 1000 MG tablet Take 2,000 mg by mouth 2 (two) times daily.      Follow-up Information    Mayo, Pete Pelt, MD. Schedule an appointment as soon as possible for a visit in 1 week(s).   Specialty:  Family Medicine Contact information: [place] [address] [phone]            The results of significant diagnostics from this hospitalization (including imaging, microbiology, ancillary and laboratory) are listed below for reference.    Significant Diagnostic Studies: Dg Chest 2 View  Result Date: 10/06/2016 CLINICAL DATA:  Chest pain. EXAM: CHEST  2 VIEW COMPARISON:  Abdominal radiograph 05/29/2016 FINDINGS: Cardiomegaly, with prominence of the epicardial fat pad. No evidence pulmonary edema. Mild central bronchial thickening. No consolidation, pleural effusion or pneumothorax. No acute osseous abnormalities. IMPRESSION: Cardiomegaly without acute abnormality. Electronically Signed   By: Jeb Levering M.D.   On: 10/06/2016 21:04   Dg Knee 2 Views Right  Result Date: 10/06/2016 CLINICAL DATA:  Knee pain EXAM: RIGHT KNEE - 1-2 VIEW COMPARISON:  None. FINDINGS: The bones are osteopenic. There is medial compartment predominant narrowing of the femorotibial joint space, though this may be exaggerated by positioning. No fracture or dislocation. Narrowing of the patellofemoral joint space with superior patellar enthesophyte. IMPRESSION: Mild right knee osteoarthrosis without acute osseous abnormality. Electronically Signed   By:  Ulyses Jarred M.D.   On: 10/06/2016 19:47   US Renal  Result Date: 10/06/2016 CLINICAL DATA:  Acute renal failure. EXAM: RENAL / URINARY TRACT ULTRASOUND COMPLETE COMPARISON:  MRI abdomen 04/17/2016. CT abdomen and pelvis 04/04/2016. FINDINGS: Right Kidney: Length: 13.3 cm. Multiple small cysts are demonstrated, mostly in the parapelvic region of the right kidney. Largest measures about 1.4 cm in diameter. Appearance is likely represent benign cysts. Similar changes were seen on previous MRI. Mild renal parenchymal thinning. No hydronephrosis. Left Kidney: Length: 13.5 cm. Mild renal parenchymal thinning. No hydronephrosis. Small  simple appearing cyst demonstrated in the mid pole. Bladder: Appears normal for degree of bladder distention. Bladder is moderately distended with a prevoid volume of 1.3 L. Patient is unable to void. IMPRESSION: Small bilateral renal cysts with mild parenchymal thinning. No hydronephrosis. Bladder is distended and patient is unable to void. Electronically Signed   By: Lucienne Capers M.D.   On: 10/06/2016 23:17   Dg Chest Port 1 View  Result Date: 10/08/2016 CLINICAL DATA:  Acute chest pain. EXAM: PORTABLE CHEST 1 VIEW COMPARISON:  10/06/2016 and prior exams FINDINGS: Cardiomegaly noted. There is no evidence of focal airspace disease, pulmonary edema, suspicious pulmonary nodule/mass, pleural effusion, or pneumothorax. No acute bony abnormalities are identified. IMPRESSION: Cardiomegaly without evidence of acute cardiopulmonary disease. Electronically Signed   By: Margarette Canada M.D.   On: 10/08/2016 21:16   Dg Foot 2 Views Right  Result Date: 10/06/2016 CLINICAL DATA:  Right foot pain EXAM: RIGHT FOOT - 2 VIEW COMPARISON:  None. FINDINGS: No fracture. No subluxation or dislocation. Degenerative changes noted in the great toe MTP joint with associated hallux valgus deformity. IMPRESSION: Degenerative changes in the great toe.  No acute bony abnormality. Electronically Signed    By: Misty Stanley M.D.   On: 10/06/2016 19:41    Microbiology: Recent Results (from the past 240 hour(s))  Urine culture     Status: Abnormal   Collection Time: 10/06/16  9:35 PM  Result Value Ref Range Status   Specimen Description URINE, RANDOM  Final   Special Requests NONE  Final   Culture (A)  Final    >=100,000 COLONIES/mL VANCOMYCIN RESISTANT ENTEROCOCCUS   Report Status 10/09/2016 FINAL  Final   Organism ID, Bacteria VANCOMYCIN RESISTANT ENTEROCOCCUS (A)  Final      Susceptibility   Vancomycin resistant enterococcus - MIC*    AMPICILLIN <=2 SENSITIVE Sensitive     LEVOFLOXACIN >=8 RESISTANT Resistant     NITROFURANTOIN <=16 SENSITIVE Sensitive     VANCOMYCIN >=32 RESISTANT Resistant     LINEZOLID 2 SENSITIVE Sensitive     * >=100,000 COLONIES/mL VANCOMYCIN RESISTANT ENTEROCOCCUS     Labs: Basic Metabolic Panel:  Recent Labs Lab 10/06/16 1819 10/07/16 0637 10/08/16 1705 10/09/16 0946 10/10/16 0734  NA 135 136 139 143 142  K 5.4* 4.4 3.3* 3.3* 3.4*  CL 102 100* 103 109 108  CO2 22 21* 27 24 23   GLUCOSE 86 91 119* 133* 105*  BUN 69* 69* 46* 35* 21*  CREATININE 4.19* 4.10* 2.82* 2.23* 1.62*  CALCIUM 8.0* 7.7* 8.0* 8.2* 8.2*   Liver Function Tests:  Recent Labs Lab 10/06/16 1819  AST 21  ALT 18  ALKPHOS 65  BILITOT 1.1  PROT 6.0*  ALBUMIN 2.8*   No results for input(s): LIPASE, AMYLASE in the last 168 hours. No results for input(s): AMMONIA in the last 168 hours. CBC:  Recent Labs Lab 10/06/16 1819 10/07/16 0637 10/09/16 0946 10/10/16 0734  WBC 9.7 8.3 5.9 8.1  NEUTROABS 7.5  --   --   --   HGB 9.4* 8.9* 9.3* 9.0*  HCT 29.0* 27.3* 28.3* 28.0*  MCV 84.8 85.8 87.9 88.1  PLT 106* 104* 93* 105*   Cardiac Enzymes:  Recent Labs Lab 10/08/16 2100 10/09/16 0244 10/09/16 0946  TROPONINI 0.05* 0.07* 0.05*   BNP: BNP (last 3 results) No results for input(s): BNP in the last 8760 hours.  ProBNP (last 3 results) No results for input(s):  PROBNP in the last 8760 hours.  CBG: No  results for input(s): GLUCAP in the last 168 hours.

## 2016-10-10 NOTE — Discharge Summary (Signed)
Pt given discharge instructions. Prescriptions and follow up info discussed with pt. Cell phone and charger sent to SNF with pt. Colostomy supplies sent with pt. Report called to [facility]. Denies questions. Pt left with ambulatory service in stretcher.

## 2016-10-10 NOTE — Progress Notes (Signed)
Physical Therapy Treatment Patient Details Name: Patrick [name] MRN: [id number] DOB: 1950/10/23 Today's Date: 10/10/2016    History of Present Illness Pt is a 66 yo male admitted through ED on 10/06/16 with increased LE and rectal pain. Upon admission, pt was found to have ARF, Hyperkalemia, Lower UTI. Pt is s/p robotic APR/colostomy on 09/28/16 for adenocarcinoma of the rectum. PMH significant for HTN, CHF, OA, CAD, DJD, dysrhythmia and SBO.      PT Comments    Continuing work on functional mobility and activity tolerance;  Bilateral knee pain with weight bearing is significantly impeding progress; noted warmth bilateral knees as well as swelling -- worth considering possiblity of gout attack(s), but no erythema noted; text paged Dr. Charlies Silvers re: my concerns about his knee pain   Follow Up Recommendations  SNF;Supervision/Assistance - 24 hour     Equipment Recommendations  None recommended by PT    Recommendations for Other Services       Precautions / Restrictions Precautions Precautions: Fall    Mobility  Bed Mobility Overal bed mobility: Needs Assistance Bed Mobility: Supine to Sit     Supine to sit: Mod assist;HOB elevated     General bed mobility comments: Mod A to bring LE's EOB and to bring trunk upright at EOB  Transfers Overall transfer level: Needs assistance Equipment used: Rolling walker (2 wheeled) Transfers: Sit to/from [facility] Sit to Stand: Mod assist;+2 physical assistance Stand pivot transfers: +2 physical assistance;Max assist       General transfer comment: Mod to max A +2 to stand and Mod A +2 throughout transfer. Exquisite pain bil knees with any weight bearing, yelling out in pain; Able to take a few steps, heavy dependence on RW and max support to successfully get to chair  Ambulation/Gait                 Stairs            Wheelchair Mobility    Modified Rankin (Stroke Patients Only)       Balance      Sitting balance-Leahy Scale: Fair       Standing balance-Leahy Scale: Poor                              Cognition Arousal/Alertness: Awake/alert Behavior During Therapy: WFL for tasks assessed/performed Overall Cognitive Status: Within Functional Limits for tasks assessed                                        Exercises      General Comments        Pertinent Vitals/Pain Pain Assessment: 0-10 Pain Score: 10-Worst pain ever ("15/10" with weight bearing bilateral knees) Pain Location: Bilateral knees with weight bearing Pain Descriptors / Indicators: Aching;Grimacing;Guarding;Moaning;Sharp Pain Intervention(s): Limited activity within patient's tolerance;Monitored during session;Repositioned    Home Living                      Prior Function            PT Goals (current goals can now be found in the care plan section) Acute Rehab PT Goals Patient Stated Goal: get back to himself PT Goal Formulation: With patient Time For Goal Achievement: 10/21/16 Potential to Achieve Goals: Good Progress towards PT goals:  (Limited by bilateral knee pain with  weight bearing)    Frequency    Min 2X/week      PT Plan Current plan remains appropriate    Co-evaluation              AM-PAC PT "6 Clicks" Daily Activity  Outcome Measure  Difficulty turning over in bed (including adjusting bedclothes, sheets and blankets)?: Total Difficulty moving from lying on back to sitting on the side of the bed? : Total Difficulty sitting down on and standing up from a chair with arms (e.g., wheelchair, bedside commode, etc,.)?: Total Help needed moving to and from a bed to chair (including a wheelchair)?: A Lot Help needed walking in hospital room?: A Lot Help needed climbing 3-5 steps with a railing? : Total 6 Click Score: 8    End of Session Equipment Utilized During Treatment: Gait belt Activity Tolerance: Patient limited by pain Patient  left: in chair;with call bell/phone within reach;with chair alarm set Nurse Communication: Mobility status;Need for lift equipment PT Visit Diagnosis: Unsteadiness on feet (R26.81);Difficulty in walking, not elsewhere classified (R26.2);Muscle weakness (generalized) (M62.81);Pain Pain - Right/Left:  (Bilateral) Pain - part of body: Knee     Time: 3729-0211 PT Time Calculation (min) (ACUTE ONLY): 22 min  Charges:  $Therapeutic Activity: 8-22 mins                    G Codes:       Roney Marion, PT  Acute Rehabilitation Services Pager [phone] Office [phone]    [place] 10/10/2016, 10:53 AM

## 2016-10-10 NOTE — Consult Note (Signed)
[place] Nurse ostomy consult note Stoma type/location: LLQ, end permanent colostomy Stomal assessment/size: 2 1/4" x 2" slightly oval and prolapsed with area that is firm above the stoma Peristomal assessment: intact  Treatment options for stomal/peristomal skin: none Output brown, liquid, + flatus Ostomy pouching: 1pc.  Education provided: demonstrated pouch change for patient, reinforced previous education. Patient was able to close lock and roll closure.  Pattern for pouch left with patient and template for measuring.  Patient reports limited resources at the SNF for ostomy care.  3 extra pouches left in room for patient to take with him at DC. Enrolled patient in [place] program: Yes  [place] Nurse will follow along with you for continued support with ostomy teaching and care Amandajo Gonder [facility] MSN, RN, [place], [place]

## 2016-10-11 ENCOUNTER — Telehealth: Payer: Self-pay | Admitting: Internal Medicine

## 2016-10-11 ENCOUNTER — Encounter: Payer: Self-pay | Admitting: Adult Health

## 2016-10-11 ENCOUNTER — Non-Acute Institutional Stay (SKILLED_NURSING_FACILITY): Payer: Medicare Other | Admitting: Adult Health

## 2016-10-11 DIAGNOSIS — E785 Hyperlipidemia, unspecified: Secondary | ICD-10-CM | POA: Diagnosis not present

## 2016-10-11 DIAGNOSIS — D696 Thrombocytopenia, unspecified: Secondary | ICD-10-CM | POA: Diagnosis not present

## 2016-10-11 DIAGNOSIS — R338 Other retention of urine: Secondary | ICD-10-CM | POA: Diagnosis not present

## 2016-10-11 DIAGNOSIS — D500 Iron deficiency anemia secondary to blood loss (chronic): Secondary | ICD-10-CM | POA: Diagnosis not present

## 2016-10-11 DIAGNOSIS — N401 Enlarged prostate with lower urinary tract symptoms: Secondary | ICD-10-CM | POA: Diagnosis not present

## 2016-10-11 DIAGNOSIS — I255 Ischemic cardiomyopathy: Secondary | ICD-10-CM

## 2016-10-11 DIAGNOSIS — N183 Chronic kidney disease, stage 3 unspecified: Secondary | ICD-10-CM

## 2016-10-11 DIAGNOSIS — I5022 Chronic systolic (congestive) heart failure: Secondary | ICD-10-CM

## 2016-10-11 DIAGNOSIS — I25119 Atherosclerotic heart disease of native coronary artery with unspecified angina pectoris: Secondary | ICD-10-CM

## 2016-10-11 DIAGNOSIS — C20 Malignant neoplasm of rectum: Secondary | ICD-10-CM | POA: Diagnosis not present

## 2016-10-11 NOTE — Telephone Encounter (Signed)
Will forward to MD. Jazmin Hartsell,CMA  

## 2016-10-11 NOTE — Telephone Encounter (Signed)
Attempted to call patient- x1.  No answer

## 2016-10-11 NOTE — Telephone Encounter (Signed)
Sister Revoir would like to talk to Dr Brett Albino about Pacen.  She says she doesn't understand whats going on with him.  Please call .  Alternate number is [phone]

## 2016-10-11 NOTE — Progress Notes (Signed)
Location:   [place] Room Number: 118 A Place of Service:  SNF (31)   CODE STATUS: Full Code  Allergies  Allergen Reactions  . Nsaids     Kidney disease    Chief Complaint  Patient presents with  . Hospitalization Follow-up    Hospital follow up    HPI:  He was originally hospitalized for adenocarcinoma of rectum with permanent colostomy. He developed acute renal on chronic renal failure and uti. He is to be treated for 7 days of amoxicillin post hospitalization for his uti. He is here for short term rehab. His goal is to return back home. There are no nursing concerns at this time    Past Medical History:  Diagnosis Date  . Arthritis    "right hand; right knee" (06/19/2014)  . CAD (coronary artery disease)    2v CAD with subtotal occlusion of small co-dominant RCA and borderline lesion in moderate-sized OM-1  . CHF (congestive heart failure) (HCC)    Preserved EF  . Coronary artery disease involving native coronary artery of native heart with angina pectoris ([place]) 09/11/2016  . Degenerative joint disease of knee, right   . Dysrhythmia   . Enlarged prostate   . Hyperlipidemia   . Hypertension   . Pneumonia 05/2014  . Prediabetes 10/03/2014  . Scrotal edema 04/03/2015  . SMALL BOWEL OBSTRUCTION, HX OF 08/21/2007   Annotation: with narrowing in the ileocecal region Qualifier: Diagnosis of  By: Hassell Done FNP, Tori Milks      Past Surgical History:  Procedure Laterality Date  . CARDIAC CATHETERIZATION N/A 02/16/2016   Procedure: Right/Left Heart Cath and Coronary Angiography;  Surgeon: Jolaine Artist, MD;  Location: [facility];  Service: Cardiovascular;  Laterality: N/A;  . COLONOSCOPY N/A 04/01/2016   Procedure: COLONOSCOPY;  Surgeon: Leighton Ruff, MD;  Location: [facility];  Service: Endoscopy;  Laterality: N/A;  . INGUINAL HERNIA REPAIR Left 1990's  . IR GENERIC HISTORICAL  07/05/2016   IR RADIOLOGIST EVAL & MGMT 07/05/2016 Corrie Mckusick, DO [facility]  INTERV RAD  . XI ROBOT ABDOMINAL PERINEAL RESECTION N/A 09/28/2016   Procedure: XI ROBOT ABDOMINOPERINEAL RESECTION WITH PERMANENT COLOSTOMY ERAS PATHWAY;  Surgeon: Michael Boston, MD;  Location: [facility];  Service: General;  Laterality: N/A;    Social History   Social History  . Marital status: Single    Spouse name: N/A  . Number of children: N/A  . Years of education: N/A   Occupational History  . Not on file.   Social History Main Topics  . Smoking status: Former Smoker    Years: 5.00    Types: Pipe, Landscape architect  . Smokeless tobacco: Never Used     Comment: 06/19/2014 "stopped smoking in ~ 2014; used to smoke a pipe or cigar a couple times/month"  . Alcohol use No  . Drug use: No  . Sexual activity: No   Other Topics Concern  . Not on file   Social History Narrative   Single, lives w/his sister of whom he is caregiver      Family History  Problem Relation Age of Onset  . Hypertension Mother   . Diabetes Mother   . Stroke Mother   . Cancer Father 44       Prostate  . Dementia Father   . COPD Sister   . Arthritis Sister   . Edema Sister       VITAL SIGNS BP (!) 150/90   Pulse 84   Temp 97.6 F (  36.4 C)   Resp (!) 24   Ht 5\' 9"  (1.753 m)   Wt 215 lb (97.5 kg)   SpO2 95%   BMI 31.75 kg/m   Patient's Medications  New Prescriptions   No medications on file  Previous Medications   ACETAMINOPHEN (TYLENOL) 500 MG TABLET    Take 1,000 mg by mouth every 6 (six) hours as needed (for pain/fever/headaches.).    AMOXICILLIN (AMOXIL) 500 MG CAPSULE    Take 1 capsule (500 mg total) by mouth 3 (three) times daily.   ASCORBIC ACID (VITAMIN C) 1000 MG TABLET    Take 2,000 mg by mouth 2 (two) times daily.   ASPIRIN EC 81 MG EC TABLET    Take 1 tablet (81 mg total) by mouth daily.   CARVEDILOL (COREG) 25 MG TABLET    Take 1 tablet (25 mg total) by mouth 2 (two) times daily.   FERROUS SULFATE 325 (65 FE) MG TABLET    take 1 tablet by mouth once daily WITH BREAKFAST    FINASTERIDE (PROSCAR) 5 MG TABLET    take 1 tablet by mouth once daily   IPRATROPIUM-ALBUTEROL (DUONEB) 0.5-2.5 (3) MG/3ML SOLN    Take 3 mLs by nebulization every 4 (four) hours as needed.   LOVASTATIN (MEVACOR) 20 MG TABLET    take 1 tablet by mouth at bedtime   METHOCARBAMOL (ROBAXIN) 750 MG TABLET    Take 1 tablet (750 mg total) by mouth 4 (four) times daily as needed (use for muscle cramps/pain).   MIRTAZAPINE (REMERON SOL-TAB) 15 MG DISINTEGRATING TABLET    Take 1 tablet (15 mg total) by mouth at bedtime.   MULTIPLE VITAMINS-MINERALS (MULTIVITAMIN WITH MINERALS) TABLET    Take 1 tablet by mouth daily. Men's 50+ Multivitamin   NITROGLYCERIN (NITROSTAT) 0.4 MG SL TABLET    Place 1 tablet (0.4 mg total) under the tongue every 5 (five) minutes as needed for chest pain.   OMEGA-3 ACID ETHYL ESTERS (LOVAZA) 1 G CAPSULE    take 2 capsules by mouth twice a day   OXYCODONE (OXY IR/ROXICODONE) 5 MG IMMEDIATE RELEASE TABLET    Take 1-2 tablets (5-10 mg total) by mouth every 6 (six) hours as needed for moderate pain, severe pain or breakthrough pain.   PANTOPRAZOLE (PROTONIX) 40 MG TABLET    Take 1 tablet (40 mg total) by mouth daily.   POTASSIUM CHLORIDE SA (K-DUR,KLOR-CON) 20 MEQ TABLET    Take 60 mEq by mouth daily.   TAMSULOSIN (FLOMAX) 0.4 MG CAPS CAPSULE    Take 0.4 mg by mouth daily.    TORSEMIDE (DEMADEX) 20 MG TABLET    Take 80 mg by mouth every morning.   TORSEMIDE (DEMADEX) 20 MG TABLET    Take 80 mg by mouth daily after supper.    TRAZODONE (DESYREL) 100 MG TABLET    Take 300 mg by mouth at bedtime.   Modified Medications   No medications on file  Discontinued Medications   No medications on file     SIGNIFICANT DIAGNOSTIC EXAMS  10-06-16: right knee x-ray: Mild right knee osteoarthrosis without acute osseous abnormality.  10-06-16: right foot x-ray: Degenerative changes in the great toe. No acute bony abnormality.  10-06-16: chest x-ray: Cardiomegaly without acute  abnormality.  10-06-16 renal ultrasound: Small bilateral renal cysts with mild parenchymal thinning. No hydronephrosis. Bladder is distended and patient is unable to void.  10-06-16: chest x-ray: Cardiomegaly without evidence of acute cardiopulmonary disease.     LABS REVIEWED:  09-29-16: wbc 9.9; hgb 10.0; hct 29.9; mcv 85.7; plt 97; glucose 160; bun 20; creat 1.37; k+ 3.9; na++139; mag 1.6 10-02-16: wbc 8.5; hgb 10.3; hct 31.1; mcv 86.4; plt 97; k+ 3.7  10-06-16: glucose 105; bun 61.5; creat 3.87; k+ 5.7; na++ 139; ca 8.1 10-06-16 (ED): wbc 9.7; hgb 9.4; hct 41.9; mcv 84.9; plt 106; glucose 86; bun 69; creat 4.19; k+ 5.4; na++135; ca 8.0; liver normal albumin 2.8; urine culture: VRE 10-09-16: blood culture: no growth  10-10-16: wbc 8.1; hgb 9.0; hct 28.0; mcv 881.1; plt 105; glucose 105; bun 21; creat 1.62; k+ 3.4; na++ 142; ca 8.2     Review of Systems  Constitutional: Negative for malaise/fatigue.  Respiratory: Negative for cough and shortness of breath.   Cardiovascular: Negative for chest pain, palpitations and leg swelling.  Gastrointestinal: Positive for abdominal pain. Negative for constipation and heartburn.       New colostomy Pain is managed   Musculoskeletal: Negative for back pain, joint pain and myalgias.  Skin: Negative.   Neurological: Negative for dizziness.  Psychiatric/Behavioral: The patient is not nervous/anxious.     Physical Exam  Constitutional: He is oriented to person, place, and time. No distress.  Obese   Eyes: Conjunctivae are normal.  Neck: Neck supple. No JVD present. No thyromegaly present.  Cardiovascular: Normal rate, regular rhythm and intact distal pulses.   Respiratory: Effort normal and breath sounds normal. No respiratory distress. He has no wheezes.  GI: Soft. Bowel sounds are normal. He exhibits no distension. There is no tenderness.  Has colostomy present with stool in bag   Genitourinary:  Genitourinary Comments: Foley present    Musculoskeletal: He exhibits no edema.  Able to move all extremities   Lymphadenopathy:    He has no cervical adenopathy.  Neurological: He is alert and oriented to person, place, and time.  Skin: Skin is warm and dry. He is not diaphoretic.  Psychiatric: He has a normal mood and affect.     ASSESSMENT/ PLAN:  1. Ischemic cardiomyopathy: has chronic systolic heart failure EF 30-35% (05-20-16): will continue demadex 80 mg twice daily  with k+ 60 meq daily will continue  coreg 25 mg twice daily. His cozaar and zaroxolyn was stopped in the hospital due to renal failure  2. CAD: no complaint of chest pain present; will continue asa 81 mg and  prn ntg  3.  Hypertension: will continue coreg 25 mg twice daily   4. Dyslipidemia: will continue mevacor 20 mg daily and lovaza 2 gm twice daily   5. Urine retention has BPH: does have a foley will continue flomax 0.4 mg daily will continue proscar 5 mg daily   6. Anemia: hgb 9.0; will continue iron daily  7. CKD stage III: bun 21; creat 1.62  8. Major depression: will continue trazodone 300 mg nightly remeron 15 mg nightly   9. Adenocarcinoma of rectum status permanent colostomy: will continue therapy as directed will follow up with surgeon. Will contine oxycodone 5 or 10 mg every 6 hours as needed   10. Gerd: will continue protonix 40 mg daily  11. Thrombocytopenia: plt 105; due to sequela of chemotherapy will monitor  12. VRE UTI: will complete amoxicillin will monitor    Will check cbc; bmp      Time spent with patient  50  minutes >50% time spent counseling; reviewing medical record; tests; labs; and developing future plan of care   MD is aware of resident's narcotic use and is  in agreement with current plan of care. We will attempt to wean resident as apropriate     Ok Edwards NP [facility]  Contact [phone] Monday through Friday 8am- 5pm  After hours call [phone]

## 2016-10-12 ENCOUNTER — Encounter: Payer: Self-pay | Admitting: Adult Health

## 2016-10-12 ENCOUNTER — Other Ambulatory Visit: Payer: Self-pay | Admitting: *Deleted

## 2016-10-12 MED ORDER — OXYCODONE HCL 5 MG PO TABS: ORAL_TABLET | ORAL | 0 refills | Status: DC

## 2016-10-12 NOTE — Progress Notes (Signed)
Location:   [place] Room Number: 118 A Place of Service:  SNF (31)   CODE STATUS: Full Code  Allergies  Allergen Reactions  . Nsaids     Kidney disease    Chief Complaint  Patient presents with  . Acute Visit    Gout    HPI:    Past Medical History:  Diagnosis Date  . Arthritis    "right hand; right knee" (06/19/2014)  . CAD (coronary artery disease)    2v CAD with subtotal occlusion of small co-dominant RCA and borderline lesion in moderate-sized OM-1  . CHF (congestive heart failure) (HCC)    Preserved EF  . Coronary artery disease involving native coronary artery of native heart with angina pectoris ([place]) 09/11/2016  . Degenerative joint disease of knee, right   . Dysrhythmia   . Enlarged prostate   . Hyperlipidemia   . Hypertension   . Pneumonia 05/2014  . Prediabetes 10/03/2014  . Scrotal edema 04/03/2015  . SMALL BOWEL OBSTRUCTION, HX OF 08/21/2007   Annotation: with narrowing in the ileocecal region Qualifier: Diagnosis of  By: Hassell Done FNP, Tori Milks      Past Surgical History:  Procedure Laterality Date  . CARDIAC CATHETERIZATION N/A 02/16/2016   Procedure: Right/Left Heart Cath and Coronary Angiography;  Surgeon: Jolaine Artist, MD;  Location: [place] [facility];  Service: Cardiovascular;  Laterality: N/A;  . COLONOSCOPY N/A 04/01/2016   Procedure: COLONOSCOPY;  Surgeon: Leighton Ruff, MD;  Location: [facility];  Service: Endoscopy;  Laterality: N/A;  . INGUINAL HERNIA REPAIR Left 1990's  . IR GENERIC HISTORICAL  07/05/2016   IR RADIOLOGIST EVAL & MGMT 07/05/2016 Corrie Mckusick, DO [facility] INTERV RAD  . XI ROBOT ABDOMINAL PERINEAL RESECTION N/A 09/28/2016   Procedure: XI ROBOT ABDOMINOPERINEAL RESECTION WITH PERMANENT COLOSTOMY ERAS PATHWAY;  Surgeon: Michael Boston, MD;  Location: [facility];  Service: General;  Laterality: N/A;    Social History   Social History  . Marital status: Single    Spouse name: N/A  . Number of children: N/A  . Years of  education: N/A   Occupational History  . Not on file.   Social History Main Topics  . Smoking status: Former Smoker    Years: 5.00    Types: Pipe, Landscape architect  . Smokeless tobacco: Never Used     Comment: 06/19/2014 "stopped smoking in ~ 2014; used to smoke a pipe or cigar a couple times/month"  . Alcohol use No  . Drug use: No  . Sexual activity: No   Other Topics Concern  . Not on file   Social History Narrative   Single, lives w/his sister of whom he is caregiver      Family History  Problem Relation Age of Onset  . Hypertension Mother   . Diabetes Mother   . Stroke Mother   . Cancer Father 64       Prostate  . Dementia Father   . COPD Sister   . Arthritis Sister   . Edema Sister       VITAL SIGNS BP (!) 150/90   Pulse 84   Temp 97.6 F (36.4 C)   Resp (!) 24   Ht 5\' 9"  (1.753 m)   Wt 215 lb (97.5 kg)   SpO2 95%   BMI 31.75 kg/m   Patient's Medications  New Prescriptions   No medications on file  Previous Medications   ACETAMINOPHEN (TYLENOL) 500 MG TABLET    Take 1,000 mg by mouth  every 6 (six) hours as needed (for pain/fever/headaches.).    AMOXICILLIN (AMOXIL) 500 MG CAPSULE    Take 1 capsule (500 mg total) by mouth 3 (three) times daily.   ASCORBIC ACID (VITAMIN C) 1000 MG TABLET    Take 2,000 mg by mouth 2 (two) times daily.   ASPIRIN EC 81 MG EC TABLET    Take 1 tablet (81 mg total) by mouth daily.   CARVEDILOL (COREG) 25 MG TABLET    Take 1 tablet (25 mg total) by mouth 2 (two) times daily.   FERROUS SULFATE 325 (65 FE) MG TABLET    take 1 tablet by mouth once daily WITH BREAKFAST   FINASTERIDE (PROSCAR) 5 MG TABLET    take 1 tablet by mouth once daily   IPRATROPIUM-ALBUTEROL (DUONEB) 0.5-2.5 (3) MG/3ML SOLN    Take 3 mLs by nebulization every 4 (four) hours as needed.   LOVASTATIN (MEVACOR) 20 MG TABLET    take 1 tablet by mouth at bedtime   METHOCARBAMOL (ROBAXIN) 750 MG TABLET    Take 1 tablet (750 mg total) by mouth 4 (four) times daily as needed  (use for muscle cramps/pain).   MIRTAZAPINE (REMERON SOL-TAB) 15 MG DISINTEGRATING TABLET    Take 1 tablet (15 mg total) by mouth at bedtime.   MULTIPLE VITAMINS-MINERALS (MULTIVITAMIN WITH MINERALS) TABLET    Take 1 tablet by mouth daily. Men's 50+ Multivitamin   NITROGLYCERIN (NITROSTAT) 0.4 MG SL TABLET    Place 1 tablet (0.4 mg total) under the tongue every 5 (five) minutes as needed for chest pain.   OMEGA-3 ACID ETHYL ESTERS (LOVAZA) 1 G CAPSULE    take 2 capsules by mouth twice a day   OXYCODONE (OXY IR/ROXICODONE) 5 MG IMMEDIATE RELEASE TABLET    Take 1-2 tablets (5-10 mg total) by mouth every 6 (six) hours as needed for moderate pain, severe pain or breakthrough pain.   PANTOPRAZOLE (PROTONIX) 40 MG TABLET    Take 1 tablet (40 mg total) by mouth daily.   POTASSIUM CHLORIDE SA (K-DUR,KLOR-CON) 20 MEQ TABLET    Take 60 mEq by mouth daily.   TAMSULOSIN (FLOMAX) 0.4 MG CAPS CAPSULE    Take 0.4 mg by mouth daily.    TORSEMIDE (DEMADEX) 20 MG TABLET    Take 80 mg by mouth every morning.   TORSEMIDE (DEMADEX) 20 MG TABLET    Take 80 mg by mouth daily after supper.    TRAZODONE (DESYREL) 100 MG TABLET    Take 300 mg by mouth at bedtime.   Modified Medications   No medications on file  Discontinued Medications   No medications on file     SIGNIFICANT DIAGNOSTIC EXAMS  10-06-16: right knee x-ray: Mild right knee osteoarthrosis without acute osseous abnormality.  10-06-16: right foot x-ray: Degenerative changes in the great toe. No acute bony abnormality.  10-06-16: chest x-ray: Cardiomegaly without acute abnormality.  10-06-16 renal ultrasound: Small bilateral renal cysts with mild parenchymal thinning. No hydronephrosis. Bladder is distended and patient is unable to void.  10-06-16: chest x-ray: Cardiomegaly without evidence of acute cardiopulmonary disease.     LABS REVIEWED:   09-29-16: wbc 9.9; hgb 10.0; hct 29.9; mcv 85.7; plt 97; glucose 160; bun 20; creat 1.37; k+ 3.9; na++139; mag  1.6 10-02-16: wbc 8.5; hgb 10.3; hct 31.1; mcv 86.4; plt 97; k+ 3.7  10-06-16: glucose 105; bun 61.5; creat 3.87; k+ 5.7; na++ 139; ca 8.1 10-06-16 (ED): wbc 9.7; hgb 9.4; hct 41.9; mcv 84.9; plt  106; glucose 86; bun 69; creat 4.19; k+ 5.4; na++135; ca 8.0; liver normal albumin 2.8; urine culture: VRE 10-09-16: blood culture: no growth  10-10-16: wbc 8.1; hgb 9.0; hct 28.0; mcv 881.1; plt 105; glucose 105; bun 21; creat 1.62; k+ 3.4; na++ 142; ca 8.2     Review of Systems  Constitutional: Negative for malaise/fatigue.  Respiratory: Negative for cough and shortness of breath.   Cardiovascular: Negative for chest pain, palpitations and leg swelling.  Gastrointestinal: Positive for abdominal pain. Negative for constipation and heartburn.       New colostomy Pain is managed   Musculoskeletal: Negative for back pain, joint pain and myalgias.  Skin: Negative.   Neurological: Negative for dizziness.  Psychiatric/Behavioral: The patient is not nervous/anxious.     Physical Exam  Constitutional: He is oriented to person, place, and time. No distress.  Obese   Eyes: Conjunctivae are normal.  Neck: Neck supple. No JVD present. No thyromegaly present.  Cardiovascular: Normal rate, regular rhythm and intact distal pulses.   Respiratory: Effort normal and breath sounds normal. No respiratory distress. He has no wheezes.  GI: Soft. Bowel sounds are normal. He exhibits no distension. There is no tenderness.  Has colostomy present with stool in bag   Genitourinary:  Genitourinary Comments: Foley present   Musculoskeletal: He exhibits no edema.  Able to move all extremities   Lymphadenopathy:    He has no cervical adenopathy.  Neurological: He is alert and oriented to person, place, and time.  Skin: Skin is warm and dry. He is not diaphoretic.  Psychiatric: He has a normal mood and affect.     ASSESSMENT/ PLAN:  1. Ischemic cardiomyopathy: has chronic systolic heart failure EF 30-35% (05-20-16):  will continue demadex 80 mg twice daily  with k+ 60 meq daily will continue  coreg 25 mg twice daily. His cozaar and zaroxolyn was stopped in the hospital due to renal failure  2. CAD: no complaint of chest pain present; will continue asa 81 mg and  prn ntg  3.  Hypertension: will continue coreg 25 mg twice daily   4. Dyslipidemia: will continue mevacor 20 mg daily and lovaza 2 gm twice daily   5. Urine retention has BPH: does have a foley will continue flomax 0.4 mg daily will continue proscar 5 mg daily   6. Anemia: hgb 9.0; will continue iron daily  7. CKD stage III: bun 21; creat 1.62  8. Major depression: will continue trazodone 300 mg nightly remeron 15 mg nightly   9. Adenocarcinoma of rectum status permanent colostomy: will continue therapy as directed will follow up with surgeon. Will contine oxycodone 5 or 10 mg every 6 hours as needed   10. Gerd: will continue protonix 40 mg daily  11. Thrombocytopenia: plt 105; due to sequela of chemotherapy will monitor  12. VRE UTI: will complete amoxicillin will monitor    Will check cbc; bmp      Time spent with patient  50  minutes >50% time spent counseling; reviewing medical record; tests; labs; and developing future plan of care   MD is aware of resident's narcotic use and is in agreement with current plan of care. We will attempt to wean resident as apropriate      Ok Edwards NP [facility]  Contact [phone] Monday through Friday 8am- 5pm  After hours call [phone]

## 2016-10-12 NOTE — Telephone Encounter (Signed)
[facility] #[phone] Fax:[phone]  

## 2016-10-13 ENCOUNTER — Other Ambulatory Visit: Payer: Self-pay | Admitting: *Deleted

## 2016-10-13 ENCOUNTER — Other Ambulatory Visit: Payer: Self-pay

## 2016-10-13 ENCOUNTER — Encounter: Payer: Self-pay | Admitting: Internal Medicine

## 2016-10-13 ENCOUNTER — Non-Acute Institutional Stay (SKILLED_NURSING_FACILITY): Payer: Medicare Other | Admitting: Internal Medicine

## 2016-10-13 ENCOUNTER — Ambulatory Visit: Payer: Medicare Other | Admitting: Hematology

## 2016-10-13 ENCOUNTER — Other Ambulatory Visit: Payer: Medicare Other

## 2016-10-13 DIAGNOSIS — N183 Chronic kidney disease, stage 3 unspecified: Secondary | ICD-10-CM

## 2016-10-13 DIAGNOSIS — M7032 Other bursitis of elbow, left elbow: Secondary | ICD-10-CM

## 2016-10-13 DIAGNOSIS — D696 Thrombocytopenia, unspecified: Secondary | ICD-10-CM

## 2016-10-13 DIAGNOSIS — R600 Localized edema: Secondary | ICD-10-CM

## 2016-10-13 DIAGNOSIS — I4891 Unspecified atrial fibrillation: Secondary | ICD-10-CM

## 2016-10-13 DIAGNOSIS — R5381 Other malaise: Secondary | ICD-10-CM

## 2016-10-13 DIAGNOSIS — L03114 Cellulitis of left upper limb: Secondary | ICD-10-CM | POA: Diagnosis not present

## 2016-10-13 DIAGNOSIS — C20 Malignant neoplasm of rectum: Secondary | ICD-10-CM

## 2016-10-13 DIAGNOSIS — I5022 Chronic systolic (congestive) heart failure: Secondary | ICD-10-CM | POA: Diagnosis not present

## 2016-10-13 DIAGNOSIS — D500 Iron deficiency anemia secondary to blood loss (chronic): Secondary | ICD-10-CM

## 2016-10-13 LAB — HEPATIC FUNCTION PANEL
ALK PHOS: 154 U/L — AB (ref 25–125)
ALT: 72 U/L — AB (ref 10–40)
AST: 45 U/L — AB (ref 14–40)
BILIRUBIN, TOTAL: 0.6 mg/dL

## 2016-10-13 LAB — BASIC METABOLIC PANEL
BUN: 23 mg/dL — AB (ref 4–21)
CREATININE: 1.6 mg/dL — AB (ref 0.6–1.3)
Glucose: 132 mg/dL
Potassium: 3.5 mmol/L (ref 3.4–5.3)
Sodium: 145 mmol/L (ref 137–147)

## 2016-10-13 LAB — CBC AND DIFFERENTIAL
HCT: 31 % — AB (ref 41–53)
Hemoglobin: 10 g/dL — AB (ref 13.5–17.5)
PLATELETS: 166 10*3/uL (ref 150–399)
WBC: 7.1 10*3/mL

## 2016-10-13 MED ORDER — OXYCODONE-ACETAMINOPHEN 5-325 MG PO TABS: ORAL_TABLET | ORAL | 0 refills | Status: DC

## 2016-10-13 NOTE — Telephone Encounter (Signed)
Refill request [facility]

## 2016-10-13 NOTE — Telephone Encounter (Signed)
Spoke with Cimo on the phone. Answered questions.

## 2016-10-13 NOTE — Progress Notes (Signed)
Patient ID: Patrick [name], male   DOB: 1951/02/11, 66 y.o.   MRN: [id number]    HISTORY AND PHYSICAL   DATE: 10/13/2016  Location:     [facility] Room Number: 118 A Place of Service: SNF (31)   Extended Emergency Contact Information Primary Emergency Contact: Racanelli,[name] Address: [place], [place] Mobile Phone: [phone] Relation: Sister  Advanced Directive information Does Patient Have a Medical Advance Directive?: Yes, Would patient like information on creating a medical advance directive?: No - Patient declined, Type of Advance Directive: Out of facility DNR (pink MOST or yellow form), Pre-existing out of facility DNR order (yellow form or pink MOST form): Pink MOST form placed in chart (order not valid for inpatient use), Does patient want to make changes to medical advance directive?: No - Patient declined  Chief Complaint  Patient presents with  . New Admit To SNF    Admission    HPI:  66 yo male seen today as a new admission into SNF following hospital stay for adenoCA of rectum, AKI, HTN, chronic sHF (EF 30-35%), thrombocytopenia, hyperkalemia, MRSA carrier, VRE bacteremia, CAD, right knee pain, AOCD. 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 He rec'd 1 units PRBCs. He returned to the hospital 4 days after d/c with AKI, acute UTI, VRE bacteremia. meds adjusted. He had tx with IV rocephin for UTI. Urine cx (+) VRE -->po amoxil. Renal US neg hydronephrosis. Right knee xray neg for acute process. RLE doppler neg for DVT. Cr 4.14->1.62; plts dropped to 79K-->105K; K 5.7-->3.4; A1c 5.7%; albumin 2.8; H/H 9/28 at d/c. He presents to SNF for short term rehab.  Today he reports c/a colostomy care, uncontrolled pain, diuretic dose and left elbow swelling. He has reduced appetite. Sister at bedside. No nursing issus. No falls. No f/c.   Ischemic cardiomyopathy/chronic systolic heart  failure  - EF 30-35% (05-20-16); takes demadex 80 mg twice daily  with k+ 60 meq daily; coreg 25 mg twice daily. off cozaar and zaroxolyn due to AKI  CAD - stable on ASA 81 mg and  prn ntg. No CP/SOB. He is on BB  Hypertension - BP stable on coreg 25 mg twice daily   Dyslipidemia - stable on mevacor 20 mg daily and lovaza 2 gm twice daily   Urine retention 2/2 BPH - foley dependent. He takes flomax 0.4 mg daily and proscar 5 mg daily   AOCD -  hgb 9.0. Takes iron daily. He rec'd 1 unit PRBCs in hospital  CKD - stage 3. Cr 1.62  Major depression - mood stable on trazodone 300 mg nightly; remeron 15 mg nightly   Adenocarcinoma of rectum status permanent colostomy - pain uncontrolled on oxycodone 5 or 10 mg every 6 hours as needed. He has rec'd chemotx.  Followed by surgery and oncology  GERD - stable on protonix 40 mg daily   Past Medical History:  Diagnosis Date  . Arthritis    "right hand; right knee" (06/19/2014)  . CAD (coronary artery disease)    2v CAD with subtotal occlusion of small co-dominant RCA and borderline lesion in moderate-sized OM-1  . CHF (congestive heart failure) (HCC)    Preserved EF  . Coronary artery disease involving native coronary artery of native heart with angina pectoris ([place]) 09/11/2016  . Degenerative joint disease of knee, right   . Dysrhythmia   . Enlarged  prostate   . Hyperlipidemia   . Hypertension   . Pneumonia 05/2014  . Prediabetes 10/03/2014  . Scrotal edema 04/03/2015  . SMALL BOWEL OBSTRUCTION, HX OF 08/21/2007   Annotation: with narrowing in the ileocecal region Qualifier: Diagnosis of  By: Hassell Done FNP, Tori Milks      Past Surgical History:  Procedure Laterality Date  . CARDIAC CATHETERIZATION N/A 02/16/2016   Procedure: Right/Left Heart Cath and Coronary Angiography;  Surgeon: Jolaine Artist, MD;  Location: [facility];  Service: Cardiovascular;  Laterality: N/A;  . COLONOSCOPY N/A 04/01/2016   Procedure: COLONOSCOPY;  Surgeon:  Leighton Ruff, MD;  Location: [facility];  Service: Endoscopy;  Laterality: N/A;  . INGUINAL HERNIA REPAIR Left 1990's  . IR GENERIC HISTORICAL  07/05/2016   IR RADIOLOGIST EVAL & MGMT 07/05/2016 Corrie Mckusick, DO [facility] INTERV RAD  . XI ROBOT ABDOMINAL PERINEAL RESECTION N/A 09/28/2016   Procedure: XI ROBOT ABDOMINOPERINEAL RESECTION WITH PERMANENT COLOSTOMY ERAS PATHWAY;  Surgeon: Michael Boston, MD;  Location: [facility];  Service: General;  Laterality: N/A;    Patient Care Team: Sela Hua, MD as PCP - General (Family Medicine) Kyung Rudd, MD as Consulting Physician (Radiation Oncology) Truitt Merle, MD as Consulting Physician (Hematology) Minus Breeding, MD as Consulting Physician (Cardiology) Michael Boston, MD as Consulting Physician (General Surgery) Ardis Hughs, MD as Attending Physician (Urology) Saporito, Maree Erie, LCSW as [facility]  Social History   Social History  . Marital status: Single    Spouse name: N/A  . Number of children: N/A  . Years of education: N/A   Occupational History  . Not on file.   Social History Main Topics  . Smoking status: Former Smoker    Years: 5.00    Types: Pipe, Landscape architect  . Smokeless tobacco: Never Used     Comment: 06/19/2014 "stopped smoking in ~ 2014; used to smoke a pipe or cigar a couple times/month"  . Alcohol use No  . Drug use: No  . Sexual activity: No   Other Topics Concern  . Not on file   Social History Narrative   Single, lives w/his sister of whom he is caregiver        reports that he has quit smoking. His smoking use included Pipe and Cigars. He quit after 5.00 years of use. He has never used smokeless tobacco. He reports that he does not drink alcohol or use drugs.  Family History  Problem Relation Age of Onset  . Hypertension Mother   . Diabetes Mother   . Stroke Mother   . Cancer Father 76       Prostate  . Dementia Father   . COPD Sister   . Arthritis Sister   . Edema  Sister    Family Status  Relation Status  . Mother Deceased  . Father Deceased  . Sister Alive    Immunization History  Administered Date(s) Administered  . Influenza Split 06/22/2012  . Influenza Whole 03/22/2006, 03/28/2007, 03/27/2008, 03/02/2009, 03/09/2010  . Influenza, High Dose Seasonal PF 01/14/2016  . Influenza,inj,Quad PF,36+ Mos 02/07/2013, 02/06/2015  . Influenza-Unspecified 02/27/2014  . PPD Test 10/03/2016  . Pneumococcal Conjugate-13 03/11/2014  . Pneumococcal Polysaccharide-23 12/15/2004, 01/25/2016  . Td 12/15/2004    Allergies  Allergen Reactions  . Nsaids     Kidney disease    Medications: Patient's Medications  New Prescriptions   No medications on file  Previous Medications   ACETAMINOPHEN (TYLENOL) 500 MG TABLET  Take 1,000 mg by mouth every 6 (six) hours as needed (for pain/fever/headaches.).    AMOXICILLIN (AMOXIL) 500 MG CAPSULE    Take 1 capsule (500 mg total) by mouth 3 (three) times daily.   ASCORBIC ACID (VITAMIN C) 1000 MG TABLET    Take 2,000 mg by mouth 2 (two) times daily.   ASPIRIN EC 81 MG EC TABLET    Take 1 tablet (81 mg total) by mouth daily.   CARVEDILOL (COREG) 25 MG TABLET    Take 1 tablet (25 mg total) by mouth 2 (two) times daily.   FERROUS SULFATE 325 (65 FE) MG TABLET    take 1 tablet by mouth once daily WITH BREAKFAST   FINASTERIDE (PROSCAR) 5 MG TABLET    take 1 tablet by mouth once daily   IPRATROPIUM-ALBUTEROL (DUONEB) 0.5-2.5 (3) MG/3ML SOLN    Take 3 mLs by nebulization every 4 (four) hours as needed.   LOVASTATIN (MEVACOR) 20 MG TABLET    take 1 tablet by mouth at bedtime   METHOCARBAMOL (ROBAXIN) 750 MG TABLET    Take 1 tablet (750 mg total) by mouth 4 (four) times daily as needed (use for muscle cramps/pain).   MIRTAZAPINE (REMERON SOL-TAB) 15 MG DISINTEGRATING TABLET    Take 1 tablet (15 mg total) by mouth at bedtime.   MULTIPLE VITAMINS-MINERALS (MULTIVITAMIN WITH MINERALS) TABLET    Take 1 tablet by mouth daily.  Men's 50+ Multivitamin   NITROGLYCERIN (NITROSTAT) 0.4 MG SL TABLET    Place 1 tablet (0.4 mg total) under the tongue every 5 (five) minutes as needed for chest pain.   OMEGA-3 ACID ETHYL ESTERS (LOVAZA) 1 G CAPSULE    take 2 capsules by mouth twice a day   OXYCODONE (OXY IR/ROXICODONE) 5 MG IMMEDIATE RELEASE TABLET    Take one to two tablets by mouth every 6 hours as needed for pain   PANTOPRAZOLE (PROTONIX) 40 MG TABLET    Take 1 tablet (40 mg total) by mouth daily.   POTASSIUM CHLORIDE SA (K-DUR,KLOR-CON) 20 MEQ TABLET    Take 60 mEq by mouth daily.   TAMSULOSIN (FLOMAX) 0.4 MG CAPS CAPSULE    Take 0.4 mg by mouth daily.    TORSEMIDE (DEMADEX) 20 MG TABLET    Take 80 mg by mouth every morning.   TORSEMIDE (DEMADEX) 20 MG TABLET    Take 80 mg by mouth daily after supper.    TRAZODONE (DESYREL) 100 MG TABLET    Take 300 mg by mouth at bedtime.   Modified Medications   No medications on file  Discontinued Medications   No medications on file    Review of Systems  Constitutional: Positive for fatigue.  Respiratory: Positive for shortness of breath.   Cardiovascular: Positive for leg swelling.  Musculoskeletal: Positive for arthralgias and joint swelling.  Skin: Positive for rash and wound.  Neurological: Positive for weakness.  All other systems reviewed and are negative.   Vitals:   10/13/16 0851  BP: (!) 148/72  Pulse: 74  Resp: 20  Temp: 98.1 F (36.7 C)  TempSrc: Oral  SpO2: 94%  Weight: 215 lb 11.2 oz (97.8 kg)  Height: '5\' 9"'  (1.753 m)   Body mass index is 31.85 kg/m.  Physical Exam  Constitutional: He is oriented to person, place, and time. He appears well-developed.  Frail appearing sitting up in bed in NAD. Looks tired  HENT:  MM dry; no oral thrush  Eyes: Pupils are equal, round, and reactive to light.  No scleral icterus.  Neck: Neck supple. Carotid bruit is not present. No thyromegaly present.  Cardiovascular: Normal rate, regular rhythm and intact distal  pulses.   Occasional extrasystoles are present. Exam reveals no gallop and no friction rub.   Murmur (1/6 SEM) heard. +1 pitting LE edema b/l. No calf TTP.   Pulmonary/Chest: Effort normal and breath sounds normal. He has no wheezes. He has no rales. He exhibits no tenderness.  Abdominal: Soft. Bowel sounds are normal. He exhibits no distension, no abdominal bruit, no pulsatile midline mass and no mass. There is no hepatomegaly. There is no tenderness. There is no rebound and no guarding.  Colostomy intact with nml appearing stoma.  Genitourinary:  Genitourinary Comments: Foley intact and DTG clear yellow urine  Musculoskeletal: He exhibits edema and tenderness.  Golf ball sized left elbow with TTP, red and warm touch. Streaking noted distally. Reduced flexion at elbow joint  Lymphadenopathy:    He has no cervical adenopathy.  Neurological: He is alert and oriented to person, place, and time.  Skin: Skin is warm and dry. Rash noted.  Psychiatric: He has a normal mood and affect. His behavior is normal. Judgment and thought content normal.     Labs reviewed: Admission on 10/06/2016, Discharged on 10/10/2016  Component Date Value Ref Range Status  . WBC 10/06/2016 9.7  4.0 - 10.5 K/uL Final  . RBC 10/06/2016 3.42* 4.22 - 5.81 MIL/uL Final  . Hemoglobin 10/06/2016 9.4* 13.0 - 17.0 g/dL Final  . HCT 10/06/2016 29.0* 39.0 - 52.0 % Final  . MCV 10/06/2016 84.8  78.0 - 100.0 fL Final  . MCH 10/06/2016 27.5  26.0 - 34.0 pg Final  . MCHC 10/06/2016 32.4  30.0 - 36.0 g/dL Final  . RDW 10/06/2016 16.9* 11.5 - 15.5 % Final  . Platelets 10/06/2016 106* 150 - 400 K/uL Final   Comment: REPEATED TO VERIFY PLATELET COUNT CONFIRMED BY SMEAR   . Neutrophils Relative % 10/06/2016 77  % Final  . Neutro Abs 10/06/2016 7.5  1.7 - 7.7 K/uL Final  . Lymphocytes Relative 10/06/2016 13  % Final  . Lymphs Abs 10/06/2016 1.2  0.7 - 4.0 K/uL Final  . Monocytes Relative 10/06/2016 10  % Final  . Monocytes  Absolute 10/06/2016 0.9  0.1 - 1.0 K/uL Final  . Eosinophils Relative 10/06/2016 1  % Final  . Eosinophils Absolute 10/06/2016 0.1  0.0 - 0.7 K/uL Final  . Basophils Relative 10/06/2016 0  % Final  . Basophils Absolute 10/06/2016 0.0  0.0 - 0.1 K/uL Final  . Sodium 10/06/2016 135  135 - 145 mmol/L Final  . Potassium 10/06/2016 5.4* 3.5 - 5.1 mmol/L Final  . Chloride 10/06/2016 102  101 - 111 mmol/L Final  . CO2 10/06/2016 22  22 - 32 mmol/L Final  . Glucose, Bld 10/06/2016 86  65 - 99 mg/dL Final  . BUN 10/06/2016 69* 6 - 20 mg/dL Final  . Creatinine, Ser 10/06/2016 4.19* 0.61 - 1.24 mg/dL Final  . Calcium 10/06/2016 8.0* 8.9 - 10.3 mg/dL Final  . Total Protein 10/06/2016 6.0* 6.5 - 8.1 g/dL Final  . Albumin 10/06/2016 2.8* 3.5 - 5.0 g/dL Final  . AST 10/06/2016 21  15 - 41 U/L Final  . ALT 10/06/2016 18  17 - 63 U/L Final  . Alkaline Phosphatase 10/06/2016 65  38 - 126 U/L Final  . Total Bilirubin 10/06/2016 1.1  0.3 - 1.2 mg/dL Final  . GFR calc non Af Amer 10/06/2016 13* >  60 mL/min Final  . GFR calc Af Amer 10/06/2016 15* >60 mL/min Final   Comment: (NOTE) The eGFR has been calculated using the CKD EPI equation. This calculation has not been validated in all clinical situations. eGFR's persistently <60 mL/min signify possible Chronic Kidney Disease.   . Anion gap 10/06/2016 11  5 - 15 Final  . Troponin i, poc 10/06/2016 0.06  0.00 - 0.08 ng/mL Final  . Comment 3 10/06/2016          Final   Comment: Due to the release kinetics of cTnI, a negative result within the first hours of the onset of symptoms does not rule out myocardial infarction with certainty. If myocardial infarction is still suspected, repeat the test at appropriate intervals.   . Color, Urine 10/06/2016 YELLOW  YELLOW Final  . APPearance 10/06/2016 CLOUDY* CLEAR Final  . Specific Gravity, Urine 10/06/2016 1.009  1.005 - 1.030 Final  . pH 10/06/2016 5.0  5.0 - 8.0 Final  . Glucose, UA 10/06/2016 NEGATIVE   NEGATIVE mg/dL Final  . Hgb urine dipstick 10/06/2016 SMALL* NEGATIVE Final  . Bilirubin Urine 10/06/2016 NEGATIVE  NEGATIVE Final  . Ketones, ur 10/06/2016 NEGATIVE  NEGATIVE mg/dL Final  . Protein, ur 10/06/2016 30* NEGATIVE mg/dL Final  . Nitrite 10/06/2016 NEGATIVE  NEGATIVE Final  . Leukocytes, UA 10/06/2016 LARGE* NEGATIVE Final  . RBC / HPF 10/06/2016 TOO NUMEROUS TO COUNT  0 - 5 RBC/hpf Final  . WBC, UA 10/06/2016 TOO NUMEROUS TO COUNT  0 - 5 WBC/hpf Final  . Bacteria, UA 10/06/2016 MANY* NONE SEEN Final  . Squamous Epithelial / LPF 10/06/2016 NONE SEEN  NONE SEEN Final  . Mucous 10/06/2016 PRESENT   Final  . Hyaline Casts, UA 10/06/2016 PRESENT   Final  . Specimen Description 10/06/2016 URINE, RANDOM   Final  . Special Requests 10/06/2016 NONE   Final  . Culture 10/06/2016 >=100,000 COLONIES/mL VANCOMYCIN RESISTANT ENTEROCOCCUS*  Final  . Report Status 10/06/2016 10/09/2016 FINAL   Final  . Organism ID, Bacteria 10/06/2016 VANCOMYCIN RESISTANT ENTEROCOCCUS*  Final  . WBC 10/07/2016 8.3  4.0 - 10.5 K/uL Final  . RBC 10/07/2016 3.18* 4.22 - 5.81 MIL/uL Final  . Hemoglobin 10/07/2016 8.9* 13.0 - 17.0 g/dL Final  . HCT 10/07/2016 27.3* 39.0 - 52.0 % Final  . MCV 10/07/2016 85.8  78.0 - 100.0 fL Final  . MCH 10/07/2016 28.0  26.0 - 34.0 pg Final  . MCHC 10/07/2016 32.6  30.0 - 36.0 g/dL Final  . RDW 10/07/2016 16.9* 11.5 - 15.5 % Final  . Platelets 10/07/2016 104* 150 - 400 K/uL Final   Comment: REPEATED TO VERIFY CONSISTENT WITH PREVIOUS RESULT   . Sodium 10/07/2016 136  135 - 145 mmol/L Final  . Potassium 10/07/2016 4.4  3.5 - 5.1 mmol/L Final  . Chloride 10/07/2016 100* 101 - 111 mmol/L Final  . CO2 10/07/2016 21* 22 - 32 mmol/L Final  . Glucose, Bld 10/07/2016 91  65 - 99 mg/dL Final  . BUN 10/07/2016 69* 6 - 20 mg/dL Final  . Creatinine, Ser 10/07/2016 4.10* 0.61 - 1.24 mg/dL Final  . Calcium 10/07/2016 7.7* 8.9 - 10.3 mg/dL Final  . GFR calc non Af Amer 10/07/2016 14*  >60 mL/min Final  . GFR calc Af Amer 10/07/2016 16* >60 mL/min Final   Comment: (NOTE) The eGFR has been calculated using the CKD EPI equation. This calculation has not been validated in all clinical situations. eGFR's persistently <60 mL/min signify possible Chronic Kidney  Disease.   . Anion gap 10/07/2016 15  5 - 15 Final  . Urea Nitrogen, Ur 10/06/2016 429  Not Estab. mg/dL Final   Comment: (NOTE) Performed At: [facility] [place], [place] [id number] Lindon Romp MD IR:[id number]   . Creatinine, Urine 10/06/2016 77.87  mg/dL Final  . Sodium 10/08/2016 139  135 - 145 mmol/L Final  . Potassium 10/08/2016 3.3* 3.5 - 5.1 mmol/L Final   DELTA CHECK NOTED  . Chloride 10/08/2016 103  101 - 111 mmol/L Final  . CO2 10/08/2016 27  22 - 32 mmol/L Final  . Glucose, Bld 10/08/2016 119* 65 - 99 mg/dL Final  . BUN 10/08/2016 46* 6 - 20 mg/dL Final  . Creatinine, Ser 10/08/2016 2.82* 0.61 - 1.24 mg/dL Final   DELTA CHECK NOTED  . Calcium 10/08/2016 8.0* 8.9 - 10.3 mg/dL Final  . GFR calc non Af Amer 10/08/2016 21* >60 mL/min Final  . GFR calc Af Amer 10/08/2016 25* >60 mL/min Final   Comment: (NOTE) The eGFR has been calculated using the CKD EPI equation. This calculation has not been validated in all clinical situations. eGFR's persistently <60 mL/min signify possible Chronic Kidney Disease.   . Anion gap 10/08/2016 9  5 - 15 Final  . Troponin I 10/08/2016 0.05* <0.03 ng/mL Final   Comment: CRITICAL RESULT CALLED TO, READ BACK BY AND VERIFIED WITH: GENGLER K,RN 10/08/16 2210 [place]   . Troponin I 10/09/2016 0.07* <0.03 ng/mL Final   CRITICAL VALUE NOTED.  VALUE IS CONSISTENT WITH PREVIOUSLY REPORTED AND CALLED VALUE.  . Troponin I 10/09/2016 0.05* <0.03 ng/mL Final   CRITICAL VALUE NOTED.  VALUE IS CONSISTENT WITH PREVIOUSLY REPORTED AND CALLED VALUE.  Marland Kitchen Specimen Description 10/09/2016 BLOOD RIGHT HAND   Final  . Special Requests 10/09/2016 BOTTLES DRAWN  AEROBIC ONLY BCAV   Final  . Culture 10/09/2016 NO GROWTH 3 DAYS   Final  . Report Status 10/09/2016 PENDING   Incomplete  . Specimen Description 10/09/2016 BLOOD LEFT HAND   Final  . Special Requests 10/09/2016 BOTTLES DRAWN AEROBIC ONLY BCAV   Final  . Culture 10/09/2016 NO GROWTH 3 DAYS   Final  . Report Status 10/09/2016 PENDING   Incomplete  . Sodium 10/09/2016 143  135 - 145 mmol/L Final  . Potassium 10/09/2016 3.3* 3.5 - 5.1 mmol/L Final  . Chloride 10/09/2016 109  101 - 111 mmol/L Final  . CO2 10/09/2016 24  22 - 32 mmol/L Final  . Glucose, Bld 10/09/2016 133* 65 - 99 mg/dL Final  . BUN 10/09/2016 35* 6 - 20 mg/dL Final  . Creatinine, Ser 10/09/2016 2.23* 0.61 - 1.24 mg/dL Final  . Calcium 10/09/2016 8.2* 8.9 - 10.3 mg/dL Final  . GFR calc non Af Amer 10/09/2016 29* >60 mL/min Final  . GFR calc Af Amer 10/09/2016 33* >60 mL/min Final   Comment: (NOTE) The eGFR has been calculated using the CKD EPI equation. This calculation has not been validated in all clinical situations. eGFR's persistently <60 mL/min signify possible Chronic Kidney Disease.   . Anion gap 10/09/2016 10  5 - 15 Final  . WBC 10/09/2016 5.9  4.0 - 10.5 K/uL Final  . RBC 10/09/2016 3.22* 4.22 - 5.81 MIL/uL Final  . Hemoglobin 10/09/2016 9.3* 13.0 - 17.0 g/dL Final  . HCT 10/09/2016 28.3* 39.0 - 52.0 % Final  . MCV 10/09/2016 87.9  78.0 - 100.0 fL Final  . MCH 10/09/2016 28.9  26.0 - 34.0 pg Final  .  MCHC 10/09/2016 32.9  30.0 - 36.0 g/dL Final  . RDW 10/09/2016 16.4* 11.5 - 15.5 % Final  . Platelets 10/09/2016 93* 150 - 400 K/uL Final   Comment: CONSISTENT WITH PREVIOUS RESULT REPEATED TO VERIFY   . Sodium 10/10/2016 142  135 - 145 mmol/L Final  . Potassium 10/10/2016 3.4* 3.5 - 5.1 mmol/L Final  . Chloride 10/10/2016 108  101 - 111 mmol/L Final  . CO2 10/10/2016 23  22 - 32 mmol/L Final  . Glucose, Bld 10/10/2016 105* 65 - 99 mg/dL Final  . BUN 10/10/2016 21* 6 - 20 mg/dL Final  . Creatinine, Ser  10/10/2016 1.62* 0.61 - 1.24 mg/dL Final  . Calcium 10/10/2016 8.2* 8.9 - 10.3 mg/dL Final  . GFR calc non Af Amer 10/10/2016 42* >60 mL/min Final  . GFR calc Af Amer 10/10/2016 49* >60 mL/min Final   Comment: (NOTE) The eGFR has been calculated using the CKD EPI equation. This calculation has not been validated in all clinical situations. eGFR's persistently <60 mL/min signify possible Chronic Kidney Disease.   . Anion gap 10/10/2016 11  5 - 15 Final  . WBC 10/10/2016 8.1  4.0 - 10.5 K/uL Final  . RBC 10/10/2016 3.18* 4.22 - 5.81 MIL/uL Final  . Hemoglobin 10/10/2016 9.0* 13.0 - 17.0 g/dL Final  . HCT 10/10/2016 28.0* 39.0 - 52.0 % Final  . MCV 10/10/2016 88.1  78.0 - 100.0 fL Final  . MCH 10/10/2016 28.3  26.0 - 34.0 pg Final  . MCHC 10/10/2016 32.1  30.0 - 36.0 g/dL Final  . RDW 10/10/2016 16.5* 11.5 - 15.5 % Final  . Platelets 10/10/2016 105* 150 - 400 K/uL Final   CONSISTENT WITH PREVIOUS RESULT  Abstract on 10/07/2016  Component Date Value Ref Range Status  . Glucose 10/06/2016 105  mg/dL Final  . BUN 10/06/2016 62* 4 - 21 mg/dL Final  . Creatinine 10/06/2016 3.9* 0.6 - 1.3 mg/dL Final  . Potassium 10/06/2016 5.7* 3.4 - 5.3 mmol/L Final  . Sodium 10/06/2016 139  137 - 147 mmol/L Final  Admission on 09/28/2016, Discharged on 10/02/2016  Component Date Value Ref Range Status  . pH, Arterial 09/28/2016 7.345* 7.350 - 7.450 Final  . pCO2 arterial 09/28/2016 52.3* 32.0 - 48.0 mmHg Final  . pO2, Arterial 09/28/2016 130.0* 83.0 - 108.0 mmHg Final  . Bicarbonate 09/28/2016 28.5* 20.0 - 28.0 mmol/L Final  . TCO2 09/28/2016 30  0 - 100 mmol/L Final  . O2 Saturation 09/28/2016 99.0  % Final  . Acid-Base Excess 09/28/2016 2.0  0.0 - 2.0 mmol/L Final  . Sodium 09/28/2016 143  135 - 145 mmol/L Final  . Potassium 09/28/2016 4.0  3.5 - 5.1 mmol/L Final  . Calcium, Ion 09/28/2016 1.19  1.15 - 1.40 mmol/L Final  . HCT 09/28/2016 26.0* 39.0 - 52.0 % Final  . Hemoglobin 09/28/2016 8.8*  13.0 - 17.0 g/dL Final  . Patient temperature 09/28/2016 HIDE   Final  . Sample type 09/28/2016 ARTERIAL   Final  . MRSA by PCR 09/28/2016 POSITIVE* NEGATIVE Final   Comment:        The GeneXpert MRSA Assay (FDA approved for NASAL specimens only), is one component of a comprehensive MRSA colonization surveillance program. It is not intended to diagnose MRSA infection nor to guide or monitor treatment for MRSA infections. RESULT CALLED TO, READ BACK BY AND VERIFIED WITH: S.ODOM RN AT 1931 ON 09/28/16 BY S.VANHOORNE   . Sodium 09/29/2016 139  135 - 145 mmol/L Final  .  Potassium 09/29/2016 3.9  3.5 - 5.1 mmol/L Final  . Chloride 09/29/2016 105  101 - 111 mmol/L Final  . CO2 09/29/2016 26  22 - 32 mmol/L Final  . Glucose, Bld 09/29/2016 160* 65 - 99 mg/dL Final  . BUN 09/29/2016 20  6 - 20 mg/dL Final  . Creatinine, Ser 09/29/2016 1.37* 0.61 - 1.24 mg/dL Final  . Calcium 09/29/2016 8.2* 8.9 - 10.3 mg/dL Final  . GFR calc non Af Amer 09/29/2016 51* >60 mL/min Final  . GFR calc Af Amer 09/29/2016 60* >60 mL/min Final   Comment: (NOTE) The eGFR has been calculated using the CKD EPI equation. This calculation has not been validated in all clinical situations. eGFR's persistently <60 mL/min signify possible Chronic Kidney Disease.   . Anion gap 09/29/2016 8  5 - 15 Final  . WBC 09/29/2016 9.9  4.0 - 10.5 K/uL Final  . RBC 09/29/2016 3.49* 4.22 - 5.81 MIL/uL Final  . Hemoglobin 09/29/2016 10.0* 13.0 - 17.0 g/dL Final  . HCT 09/29/2016 29.9* 39.0 - 52.0 % Final  . MCV 09/29/2016 85.7  78.0 - 100.0 fL Final  . MCH 09/29/2016 28.7  26.0 - 34.0 pg Final  . MCHC 09/29/2016 33.4  30.0 - 36.0 g/dL Final  . RDW 09/29/2016 16.4* 11.5 - 15.5 % Final  . Platelets 09/29/2016 97* 150 - 400 K/uL Final   Comment: SPECIMEN CHECKED FOR CLOTS REPEATED TO VERIFY PLATELET COUNT CONFIRMED BY SMEAR   . Magnesium 09/29/2016 1.6* 1.7 - 2.4 mg/dL Final  . Glucose-Capillary 09/28/2016 127* 65 - 99 mg/dL  Final  . Comment 1 09/28/2016 Notify RN   Final  . Comment 2 09/28/2016 Document in Chart   Final  . Glucose-Capillary 09/29/2016 130* 65 - 99 mg/dL Final  . WBC 09/30/2016 10.4  4.0 - 10.5 K/uL Final  . RBC 09/30/2016 3.36* 4.22 - 5.81 MIL/uL Final  . Hemoglobin 09/30/2016 9.8* 13.0 - 17.0 g/dL Final  . HCT 09/30/2016 29.2* 39.0 - 52.0 % Final  . MCV 09/30/2016 86.9  78.0 - 100.0 fL Final  . MCH 09/30/2016 29.2  26.0 - 34.0 pg Final  . MCHC 09/30/2016 33.6  30.0 - 36.0 g/dL Final  . RDW 09/30/2016 16.6* 11.5 - 15.5 % Final  . Platelets 09/30/2016 89* 150 - 400 K/uL Final   CONSISTENT WITH PREVIOUS RESULT  . Potassium 09/30/2016 3.7  3.5 - 5.1 mmol/L Final  . Creatinine, Ser 09/30/2016 1.16  0.61 - 1.24 mg/dL Final  . GFR calc non Af Amer 09/30/2016 >60  >60 mL/min Final  . GFR calc Af Amer 09/30/2016 >60  >60 mL/min Final   Comment: (NOTE) The eGFR has been calculated using the CKD EPI equation. This calculation has not been validated in all clinical situations. eGFR's persistently <60 mL/min signify possible Chronic Kidney Disease.   . Magnesium 09/30/2016 2.5* 1.7 - 2.4 mg/dL Final  . WBC 10/01/2016 8.3  4.0 - 10.5 K/uL Final  . RBC 10/01/2016 3.42* 4.22 - 5.81 MIL/uL Final  . Hemoglobin 10/01/2016 9.9* 13.0 - 17.0 g/dL Final  . HCT 10/01/2016 29.7* 39.0 - 52.0 % Final  . MCV 10/01/2016 86.8  78.0 - 100.0 fL Final  . MCH 10/01/2016 28.9  26.0 - 34.0 pg Final  . MCHC 10/01/2016 33.3  30.0 - 36.0 g/dL Final  . RDW 10/01/2016 16.4* 11.5 - 15.5 % Final  . Platelets 10/01/2016 79* 150 - 400 K/uL Final   CONSISTENT WITH PREVIOUS RESULT  . Potassium 10/01/2016  3.8  3.5 - 5.1 mmol/L Final  . Creatinine, Ser 10/01/2016 1.30* 0.61 - 1.24 mg/dL Final  . GFR calc non Af Amer 10/01/2016 55* >60 mL/min Final  . GFR calc Af Amer 10/01/2016 >60  >60 mL/min Final   Comment: (NOTE) The eGFR has been calculated using the CKD EPI equation. This calculation has not been validated in all  clinical situations. eGFR's persistently <60 mL/min signify possible Chronic Kidney Disease.   . WBC 10/02/2016 8.5  4.0 - 10.5 K/uL Final  . RBC 10/02/2016 3.60* 4.22 - 5.81 MIL/uL Final  . Hemoglobin 10/02/2016 10.3* 13.0 - 17.0 g/dL Final  . HCT 10/02/2016 31.1* 39.0 - 52.0 % Final  . MCV 10/02/2016 86.4  78.0 - 100.0 fL Final  . MCH 10/02/2016 28.6  26.0 - 34.0 pg Final  . MCHC 10/02/2016 33.1  30.0 - 36.0 g/dL Final  . RDW 10/02/2016 16.3* 11.5 - 15.5 % Final  . Platelets 10/02/2016 97* 150 - 400 K/uL Final   CONSISTENT WITH PREVIOUS RESULT  . Potassium 10/02/2016 3.7  3.5 - 5.1 mmol/L Final  . Creatinine, Ser 10/02/2016 1.28* 0.61 - 1.24 mg/dL Final  . GFR calc non Af Amer 10/02/2016 56* >60 mL/min Final  . GFR calc Af Amer 10/02/2016 >60  >60 mL/min Final   Comment: (NOTE) The eGFR has been calculated using the CKD EPI equation. This calculation has not been validated in all clinical situations. eGFR's persistently <60 mL/min signify possible Chronic Kidney Disease.   Orders Only on 09/26/2016  Component Date Value Ref Range Status  . Glucose 09/26/2016 150* 65 - 99 mg/dL Final  . BUN 09/26/2016 23  8 - 27 mg/dL Final  . Creatinine, Ser 09/26/2016 1.75* 0.76 - 1.27 mg/dL Final  . GFR calc non Af Amer 09/26/2016 39* >59 mL/min/1.73 Final  . GFR calc Af Amer 09/26/2016 45* >59 mL/min/1.73 Final  . BUN/Creatinine Ratio 09/26/2016 13  10 - 24 Final  . Sodium 09/26/2016 146* 134 - 144 mmol/L Final  . Potassium 09/26/2016 3.8  3.5 - 5.2 mmol/L Final  . Chloride 09/26/2016 100  96 - 106 mmol/L Final  . CO2 09/26/2016 29  18 - 29 mmol/L Final  . Calcium 09/26/2016 9.1  8.6 - 10.2 mg/dL Final  . Magnesium 09/26/2016 2.0  1.6 - 2.3 mg/dL Final  Lab on 09/26/2016  Component Date Value Ref Range Status  . Glucose 09/26/2016 152* 65 - 99 mg/dL Final  . BUN 09/26/2016 23  8 - 27 mg/dL Final  . Creatinine, Ser 09/26/2016 1.47* 0.76 - 1.27 mg/dL Final  . GFR calc non Af Amer  09/26/2016 48* >59 mL/min/1.73 Final  . GFR calc Af Amer 09/26/2016 56* >59 mL/min/1.73 Final  . BUN/Creatinine Ratio 09/26/2016 16  10 - 24 Final  . Sodium 09/26/2016 148* 134 - 144 mmol/L Final  . Potassium 09/26/2016 3.8  3.5 - 5.2 mmol/L Final  . Chloride 09/26/2016 100  96 - 106 mmol/L Final  . CO2 09/26/2016 30* 18 - 29 mmol/L Final  . Calcium 09/26/2016 8.9  8.6 - 10.2 mg/dL Final  . Magnesium 09/26/2016 2.0  1.6 - 2.3 mg/dL Final  . WBC 09/26/2016 WILL FOLLOW   Preliminary  . RBC 09/26/2016 WILL FOLLOW   Preliminary  . Hemoglobin 09/26/2016 WILL FOLLOW   Preliminary  . Hematocrit 09/26/2016 WILL FOLLOW   Preliminary  . MCV 09/26/2016 WILL FOLLOW   Preliminary  . [facility] 09/26/2016 WILL FOLLOW   Preliminary  . MCHC 09/26/2016  WILL FOLLOW   Preliminary  . RDW 09/26/2016 WILL FOLLOW   Preliminary  . Platelets 09/26/2016 WILL FOLLOW   Preliminary  . Neutrophils 09/26/2016 WILL FOLLOW   Preliminary  . Lymphs 09/26/2016 WILL FOLLOW   Preliminary  . Monocytes 09/26/2016 WILL FOLLOW   Preliminary  . Eos 09/26/2016 WILL FOLLOW   Preliminary  . Basos 09/26/2016 WILL FOLLOW   Preliminary  . Neutrophils Absolute 09/26/2016 WILL FOLLOW   Preliminary  . Lymphocytes Absolute 09/26/2016 WILL FOLLOW   Preliminary  . Monocytes Absolute 09/26/2016 WILL FOLLOW   Preliminary  . EOS (ABSOLUTE) 09/26/2016 WILL FOLLOW   Preliminary  . Basophils Absolute 09/26/2016 WILL FOLLOW   Preliminary  . Immature Granulocytes 09/26/2016 WILL FOLLOW   Preliminary  . Immature Grans (Abs) 09/26/2016 WILL FOLLOW   Preliminary  Hospital Outpatient Visit on 09/23/2016  Component Date Value Ref Range Status  . Hgb A1c MFr Bld 09/23/2016 5.7* 4.8 - 5.6 % Final   Comment: (NOTE)         Pre-diabetes: 5.7 - 6.4         Diabetes: >6.4         Glycemic control for adults with diabetes: <7.0   . Mean Plasma Glucose 09/23/2016 117  mg/dL Final   Comment: (NOTE) Performed At: [facility] [place], [place] [id number] Lindon Romp MD FU:[id number]   . Sodium 09/23/2016 143  135 - 145 mmol/L Final  . Potassium 09/23/2016 2.6* 3.5 - 5.1 mmol/L Final   Comment: CRITICAL RESULT CALLED TO, READ BACK BY AND VERIFIED WITH: T.WISE AT 1725 09/23/16 BY N.THOMPSON CRITICAL RESULT CALLED TO, READ BACK BY AND VERIFIED WITH: DR Excell Seltzer AT 0254 09/23/16 PER T.WISE   . Chloride 09/23/2016 98* 101 - 111 mmol/L Final  . CO2 09/23/2016 32  22 - 32 mmol/L Final  . Glucose, Bld 09/23/2016 130* 65 - 99 mg/dL Final  . BUN 09/23/2016 43* 6 - 20 mg/dL Final  . Creatinine, Ser 09/23/2016 1.55* 0.61 - 1.24 mg/dL Final  . Calcium 09/23/2016 9.2  8.9 - 10.3 mg/dL Final  . GFR calc non Af Amer 09/23/2016 44* >60 mL/min Final  . GFR calc Af Amer 09/23/2016 51* >60 mL/min Final   Comment: (NOTE) The eGFR has been calculated using the CKD EPI equation. This calculation has not been validated in all clinical situations. eGFR's persistently <60 mL/min signify possible Chronic Kidney Disease.   . Anion gap 09/23/2016 13  5 - 15 Final  . WBC 09/23/2016 6.0  4.0 - 10.5 K/uL Final  . RBC 09/23/2016 3.76* 4.22 - 5.81 MIL/uL Final  . Hemoglobin 09/23/2016 10.6* 13.0 - 17.0 g/dL Final  . HCT 09/23/2016 32.3* 39.0 - 52.0 % Final  . MCV 09/23/2016 85.9  78.0 - 100.0 fL Final  . MCH 09/23/2016 28.2  26.0 - 34.0 pg Final  . MCHC 09/23/2016 32.8  30.0 - 36.0 g/dL Final  . RDW 09/23/2016 17.0* 11.5 - 15.5 % Final  . Platelets 09/23/2016 109* 150 - 400 K/uL Final   CONSISTENT WITH PREVIOUS RESULT  . ABO/RH(D) 09/23/2016 O NEG   Final  . Antibody Screen 09/23/2016 NEG   Final  . Sample Expiration 09/23/2016 10/01/2016   Final  . Extend sample reason 09/23/2016 NO TRANSFUSIONS OR PREGNANCY IN THE PAST 3 MONTHS   Final  . Unit Number 09/23/2016 [id number]   Final  . Blood Component Type 09/23/2016 RBC LR PHER1   Final  . Unit division 09/23/2016  00   Final  . Status of Unit 09/23/2016 ISSUED,FINAL   Final    . Transfusion Status 09/23/2016 OK TO TRANSFUSE   Final  . Crossmatch Result 09/23/2016 Compatible   Final  . Unit Number 09/23/2016 [id number]   Final  . Blood Component Type 09/23/2016 RED CELLS,LR   Final  . Unit division 09/23/2016 00   Final  . Status of Unit 09/23/2016 REL FROM [facility]   Final  . Transfusion Status 09/23/2016 OK TO TRANSFUSE   Final  . Crossmatch Result 09/23/2016 Compatible   Final  . ABO/RH(D) 09/23/2016 O NEG   Final  . ISSUE DATE / TIME 09/23/2016 [id number]   Final  . Blood Product Unit Number 09/23/2016 [id number]   Final  . PRODUCT CODE 09/23/2016 [id number]   Final  . Unit Type and Rh 09/23/2016 9500   Final  . Blood Product Expiration Date 09/23/2016 [id number]   Final  . Blood Product Unit Number 09/23/2016 [id number]   Final  . Unit Type and Rh 09/23/2016 9500   Final  . Blood Product Expiration Date 09/23/2016 [id number]   Final  Orders Only on 07/25/2016  Component Date Value Ref Range Status  . Glucose 07/25/2016 97  65 - 99 mg/dL Final  . BUN 07/25/2016 30* 8 - 27 mg/dL Final  . Creatinine, Ser 07/25/2016 1.39* 0.76 - 1.27 mg/dL Final  . GFR calc non Af Amer 07/25/2016 52* >59 mL/min/1.73 Final  . GFR calc Af Amer 07/25/2016 60  >59 mL/min/1.73 Final  . BUN/Creatinine Ratio 07/25/2016 22  10 - 24 Final  . Sodium 07/25/2016 143  134 - 144 mmol/L Final  . Potassium 07/25/2016 3.6  3.5 - 5.2 mmol/L Final  . Chloride 07/25/2016 92* 96 - 106 mmol/L Final  . CO2 07/25/2016 33* 18 - 29 mmol/L Final  . Calcium 07/25/2016 9.6  8.6 - 10.2 mg/dL Final    Dg Chest 2 View  Result Date: 10/06/2016 CLINICAL DATA:  Chest pain. EXAM: CHEST  2 VIEW COMPARISON:  Abdominal radiograph 05/29/2016 FINDINGS: Cardiomegaly, with prominence of the epicardial fat pad. No evidence pulmonary edema. Mild central bronchial thickening. No consolidation, pleural effusion or pneumothorax. No acute osseous abnormalities. IMPRESSION: Cardiomegaly without acute  abnormality. Electronically Signed   By: Jeb Levering M.D.   On: 10/06/2016 21:04   Dg Knee 2 Views Right  Result Date: 10/06/2016 CLINICAL DATA:  Knee pain EXAM: RIGHT KNEE - 1-2 VIEW COMPARISON:  None. FINDINGS: The bones are osteopenic. There is medial compartment predominant narrowing of the femorotibial joint space, though this may be exaggerated by positioning. No fracture or dislocation. Narrowing of the patellofemoral joint space with superior patellar enthesophyte. IMPRESSION: Mild right knee osteoarthrosis without acute osseous abnormality. Electronically Signed   By: Ulyses Jarred M.D.   On: 10/06/2016 19:47   US Renal  Result Date: 10/06/2016 CLINICAL DATA:  Acute renal failure. EXAM: RENAL / URINARY TRACT ULTRASOUND COMPLETE COMPARISON:  MRI abdomen 04/17/2016. CT abdomen and pelvis 04/04/2016. FINDINGS: Right Kidney: Length: 13.3 cm. Multiple small cysts are demonstrated, mostly in the parapelvic region of the right kidney. Largest measures about 1.4 cm in diameter. Appearance is likely represent benign cysts. Similar changes were seen on previous MRI. Mild renal parenchymal thinning. No hydronephrosis. Left Kidney: Length: 13.5 cm. Mild renal parenchymal thinning. No hydronephrosis. Small simple appearing cyst demonstrated in the mid pole. Bladder: Appears normal for degree of bladder distention. Bladder is moderately distended with a prevoid volume of 1.3 L. Patient is  unable to void. IMPRESSION: Small bilateral renal cysts with mild parenchymal thinning. No hydronephrosis. Bladder is distended and patient is unable to void. Electronically Signed   By: Lucienne Capers M.D.   On: 10/06/2016 23:17   Dg Chest Port 1 View  Result Date: 10/08/2016 CLINICAL DATA:  Acute chest pain. EXAM: PORTABLE CHEST 1 VIEW COMPARISON:  10/06/2016 and prior exams FINDINGS: Cardiomegaly noted. There is no evidence of focal airspace disease, pulmonary edema, suspicious pulmonary nodule/mass, pleural  effusion, or pneumothorax. No acute bony abnormalities are identified. IMPRESSION: Cardiomegaly without evidence of acute cardiopulmonary disease. Electronically Signed   By: Margarette Canada M.D.   On: 10/08/2016 21:16   Dg Foot 2 Views Right  Result Date: 10/06/2016 CLINICAL DATA:  Right foot pain EXAM: RIGHT FOOT - 2 VIEW COMPARISON:  None. FINDINGS: No fracture. No subluxation or dislocation. Degenerative changes noted in the great toe MTP joint with associated hallux valgus deformity. IMPRESSION: Degenerative changes in the great toe.  No acute bony abnormality. Electronically Signed   By: Misty Stanley M.D.   On: 10/06/2016 19:41     Assessment/Plan   ICD-9-CM ICD-10-CM   1. Cellulitis of left upper extremity 682.3 L03.114   2. Bursitis of left elbow, unspecified bursa 726.33 M70.32   3. Physical deconditioning 799.3 R53.81   4. Lower extremity edema 782.3 R60.0   5. Chronic systolic heart failure (HCC) 428.22 I50.22   6. Chronic kidney disease (CKD), stage III (moderate) 585.3 N18.3   7. Atrial fibrillation, unspecified type ([place]) 427.31 I48.91   8. 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 Thrombocytopenia (HCC) 287.5 D69.6   10. Iron deficiency anemia due to chronic blood loss 280.0 D50.0     Check CBC w diff, bmp and uric acid level TODAY  Start bactrim DS daily  x 10 days for left UE cellulitis  Refer to Ortho urgently for cellulitis and left elbow bursitis  Finish Amoxicillin for VRE UTI. Continue contact isolation until abx completed  Change torsemide 50m in AM and 447min PM  Cont OxyIR - change frequency to q4hr prn.  Cont other meds as ordered  Colostomy care as indicated  Foley cath care as indicated  DAILY weight and record  F/u with Cardiology, oncology and surgery as scheduled  Nutritional supplements as indicated  PT/OT/ST as ordered  GOAL: short term rehab and d/c home when medically appropriate.  Communicated with pt and nursing.  Will follow  [facility] S[facility] CaPerlie [facility]PiSent[facility] [address] [place] [id number] (Monday-Friday 8 AM - 5 PM) [phone] 5 PM and follow prompts

## 2016-10-13 NOTE — Patient Outreach (Signed)
[facility]) Care Management  10/13/2016  Patrick [name] 10-25-1950 [id number]   CSW was able to meet with patient and patient's sister, [name] today at [facility] [place] where they both currently reside to receive rehabilitative services.  Patient appeared to be in great spirits today, reporting that he feels much better, after having spent several days in the hospital.  Patient is able to get out of bed without assistance, but only able to walk or stand for short periods of time, due to pain and swelling in his knees and feet.  Patient admits that he is working well with therapies (both physical and occupational).  CSW continues to motivate patient to try and familiarize himself with cleaning out his colostomy bag, as patient has still not performed this task independently.   CSW was able to converse with [place], Veterinary surgeon at [name] regarding long-term care placement arrangements for patient and [name].  Anguilla indicated that she did not foresee it being a problem moving both patient and [name] to a Long-Term Care Medicaid bed, as they currently have male and male beds available.  CSW explained to [place] that [facility] was able to assist patient with completion of a Long-Term Care Medicaid application for [name], prior to them being admitted to [facility].  It would just be a matter of switching patient's Adult Medicaid to Long-Term Care Medicaid, which CSW agreed to do by contacting patient's Medicaid Case Worker at the [facility]. An[place]illa indicated that she plans to meet with the interdisciplinary team tomorrow morning, at which time, patient and [name]'s cases will be reviewed.  Anguilla agreed to obtain confirmation from the administrator at [name] that patient and [name] will be able to receive long-term care at their facility, then follow-up with CSW to  report findings.  CSW explained to [place] that it is imperative that patient and [name] continue to receive therapies, as they are both making great progress.  Anguilla voiced understanding and was agreeable to this plan.  CSW will follow-up with patient and [name] in one week, while meeting with them at [facility] to perform a routine visit. [name], BSW, MSW, LCSW  Licensed Education officer, [facility] [employer] [facility]  [place] [address] [place], [place] [address] Physical Address-[address], [place], [place] [address] Toll Free Main # [phone] Fax # [phone] Cell # [phone]  Office # [phone] [email or web address]

## 2016-10-13 NOTE — Progress Notes (Signed)
This encounter was created in error - please disregard.

## 2016-10-14 ENCOUNTER — Non-Acute Institutional Stay (SKILLED_NURSING_FACILITY): Payer: Medicare Other | Admitting: Adult Health

## 2016-10-14 ENCOUNTER — Encounter: Payer: Self-pay | Admitting: Adult Health

## 2016-10-14 DIAGNOSIS — M10369 Gout due to renal impairment, unspecified knee: Secondary | ICD-10-CM

## 2016-10-14 LAB — BASIC METABOLIC PANEL
BUN: 21 mg/dL (ref 4–21)
Creatinine: 1.4 mg/dL — AB (ref 0.6–1.3)
GLUCOSE: 164 mg/dL
POTASSIUM: 3.4 mmol/L (ref 3.4–5.3)
SODIUM: 143 mmol/L (ref 137–147)

## 2016-10-14 LAB — CULTURE, BLOOD (ROUTINE X 2)
CULTURE: NO GROWTH
Culture: NO GROWTH

## 2016-10-14 LAB — CBC AND DIFFERENTIAL
HEMATOCRIT: 30 % — AB (ref 41–53)
HEMOGLOBIN: 9.9 g/dL — AB (ref 13.5–17.5)
Neutrophils Absolute: 7 /uL
Platelets: 150 10*3/uL (ref 150–399)
WBC: 7.3 10^3/mL

## 2016-10-14 NOTE — Progress Notes (Signed)
Location:   [place] Room Number: 118 A Place of Service:  SNF (31)   CODE STATUS: Full Code  Allergies  Allergen Reactions  . Nsaids     Kidney disease    Chief Complaint  Patient presents with  . Acute Visit    Gout    HPI:  He is having gout pain in his knees. His uric acid level is elevated at 9.7.  He tells me that his knees are causing him significant pain.   Past Medical History:  Diagnosis Date  . Arthritis    "right hand; right knee" (06/19/2014)  . CAD (coronary artery disease)    2v CAD with subtotal occlusion of small co-dominant RCA and borderline lesion in moderate-sized OM-1  . CHF (congestive heart failure) (HCC)    Preserved EF  . Coronary artery disease involving native coronary artery of native heart with angina pectoris ([place]) 09/11/2016  . Degenerative joint disease of knee, right   . Dysrhythmia   . Enlarged prostate   . Hyperlipidemia   . Hypertension   . Pneumonia 05/2014  . Prediabetes 10/03/2014  . Scrotal edema 04/03/2015  . SMALL BOWEL OBSTRUCTION, HX OF 08/21/2007   Annotation: with narrowing in the ileocecal region Qualifier: Diagnosis of  By: Hassell Done FNP, Tori Milks      Past Surgical History:  Procedure Laterality Date  . CARDIAC CATHETERIZATION N/A 02/16/2016   Procedure: Right/Left Heart Cath and Coronary Angiography;  Surgeon: Jolaine Artist, MD;  Location: [place] [facility];  Service: Cardiovascular;  Laterality: N/A;  . COLONOSCOPY N/A 04/01/2016   Procedure: COLONOSCOPY;  Surgeon: Leighton Ruff, MD;  Location: [facility];  Service: Endoscopy;  Laterality: N/A;  . INGUINAL HERNIA REPAIR Left 1990's  . IR GENERIC HISTORICAL  07/05/2016   IR RADIOLOGIST EVAL & MGMT 07/05/2016 Corrie Mckusick, DO [facility] INTERV RAD  . XI ROBOT ABDOMINAL PERINEAL RESECTION N/A 09/28/2016   Procedure: XI ROBOT ABDOMINOPERINEAL RESECTION WITH PERMANENT COLOSTOMY ERAS PATHWAY;  Surgeon: Michael Boston, MD;  Location: [facility] ORS;  Service: General;   Laterality: N/A;    Social History   Social History  . Marital status: Single    Spouse name: N/A  . Number of children: N/A  . Years of education: N/A   Occupational History  . Not on file.   Social History Main Topics  . Smoking status: Former Smoker    Years: 5.00    Types: Pipe, Landscape architect  . Smokeless tobacco: Never Used     Comment: 06/19/2014 "stopped smoking in ~ 2014; used to smoke a pipe or cigar a couple times/month"  . Alcohol use No  . Drug use: No  . Sexual activity: No   Other Topics Concern  . Not on file   Social History Narrative   Single, lives w/his sister of whom he is caregiver      Family History  Problem Relation Age of Onset  . Hypertension Mother   . Diabetes Mother   . Stroke Mother   . Cancer Father 36       Prostate  . Dementia Father   . COPD Sister   . Arthritis Sister   . Edema Sister       VITAL SIGNS BP (!) 148/72   Pulse 74   Temp 98.1 F (36.7 C)   Resp 20   Ht 5\' 9"  (1.753 m)   Wt 215 lb 11.2 oz (97.8 kg)   SpO2 94%   BMI 31.85 kg/m  Patient's Medications  New Prescriptions   No medications on file  Previous Medications   ACETAMINOPHEN (TYLENOL) 500 MG TABLET    Take 1,000 mg by mouth every 6 (six) hours as needed (for pain/fever/headaches.).    AMOXICILLIN (AMOXIL) 500 MG CAPSULE    Take 1 capsule (500 mg total) by mouth 3 (three) times daily.   ASCORBIC ACID (VITAMIN C) 1000 MG TABLET    Take 2,000 mg by mouth 2 (two) times daily.   ASPIRIN EC 81 MG EC TABLET    Take 1 tablet (81 mg total) by mouth daily.   CARVEDILOL (COREG) 25 MG TABLET    Take 1 tablet (25 mg total) by mouth 2 (two) times daily.   FERROUS SULFATE 325 (65 FE) MG TABLET    take 1 tablet by mouth once daily WITH BREAKFAST   FINASTERIDE (PROSCAR) 5 MG TABLET    take 1 tablet by mouth once daily   IPRATROPIUM-ALBUTEROL (DUONEB) 0.5-2.5 (3) MG/3ML SOLN    Take 3 mLs by nebulization every 4 (four) hours as needed.   LOVASTATIN (MEVACOR) 20 MG TABLET     take 1 tablet by mouth at bedtime   MENTHOL, TOPICAL ANALGESIC, (BIOFREEZE ROLL-ON) 4 % GEL    Apply to bilateral knees topically every 12 hours as needed for pain   METHOCARBAMOL (ROBAXIN) 750 MG TABLET    Take 1 tablet (750 mg total) by mouth 4 (four) times daily as needed (use for muscle cramps/pain).   MIRTAZAPINE (REMERON SOL-TAB) 15 MG DISINTEGRATING TABLET    Take 1 tablet (15 mg total) by mouth at bedtime.   MULTIPLE VITAMINS-MINERALS (MULTIVITAMIN WITH MINERALS) TABLET    Take 1 tablet by mouth daily. Men's 50+ Multivitamin   NITROGLYCERIN (NITROSTAT) 0.4 MG SL TABLET    Place 1 tablet (0.4 mg total) under the tongue every 5 (five) minutes as needed for chest pain.   OMEGA-3 ACID ETHYL ESTERS (LOVAZA) 1 G CAPSULE    take 2 capsules by mouth twice a day   OXYCODONE HCL, ABUSE DETER, (OXAYDO) 5 MG TABA    Give 1-2 tablets by mouth every 4 hours as needed for Moderate to Severe pain.  Hold for sedation or respiratory depression.   PANTOPRAZOLE (PROTONIX) 40 MG TABLET    Take 1 tablet (40 mg total) by mouth daily.   POTASSIUM CHLORIDE SA (K-DUR,KLOR-CON) 20 MEQ TABLET    Take 60 mEq by mouth daily.   SULFAMETHOXAZOLE-TRIMETHOPRIM (BACTRIM DS,SEPTRA DS) 800-160 MG TABLET    Take 1 tablet by mouth 2 (two) times daily.   TAMSULOSIN (FLOMAX) 0.4 MG CAPS CAPSULE    Take 0.4 mg by mouth daily.    TORSEMIDE (DEMADEX) 20 MG TABLET    Take 80 mg in the AM and  40 mg by mouth daily after supper.    TRAZODONE (DESYREL) 100 MG TABLET    Take 300 mg by mouth at bedtime.   Modified Medications   No medications on file  Discontinued Medications   OXYCODONE (OXY IR/ROXICODONE) 5 MG IMMEDIATE RELEASE TABLET    Take one to two tablets by mouth every 6 hours as needed for pain   OXYCODONE-ACETAMINOPHEN (PERCOCET/ROXICET) 5-325 MG TABLET    Take one tablet every 8 hours as needed for severe pain   TORSEMIDE (DEMADEX) 20 MG TABLET    Take 80 mg by mouth every morning.     SIGNIFICANT DIAGNOSTIC  EXAMS  10-06-16: right knee x-ray: Mild right knee osteoarthrosis without acute osseous abnormality.  10-06-16: right foot x-ray: Degenerative changes in the great toe. No acute bony abnormality.  10-06-16: chest x-ray: Cardiomegaly without acute abnormality.  10-06-16 renal ultrasound: Small bilateral renal cysts with mild parenchymal thinning. No hydronephrosis. Bladder is distended and patient is unable to void.  10-06-16: chest x-ray: Cardiomegaly without evidence of acute cardiopulmonary disease.     LABS REVIEWED:   09-29-16: wbc 9.9; hgb 10.0; hct 29.9; mcv 85.7; plt 97; glucose 160; bun 20; creat 1.37; k+ 3.9; na++139; mag 1.6 10-02-16: wbc 8.5; hgb 10.3; hct 31.1; mcv 86.4; plt 97; k+ 3.7  10-06-16: glucose 105; bun 61.5; creat 3.87; k+ 5.7; na++ 139; ca 8.1 10-06-16 (ED): wbc 9.7; hgb 9.4; hct 41.9; mcv 84.9; plt 106; glucose 86; bun 69; creat 4.19; k+ 5.4; na++135; ca 8.0; liver normal albumin 2.8; urine culture: VRE 10-09-16: blood culture: no growth  10-10-16: wbc 8.1; hgb 9.0; hct 28.0; mcv 881.1; plt 105; glucose 105; bun 21; creat 1.62; k+ 3.4; na++ 142; ca 8.2  10-14-16: wbc 7.3; hgb 9.9; hct 29.9 ;mcv 85.2; plt 150;g lcuose 164; bun 21.4; creat 1.38; k+ 3.4 ;na++ 143; ca 7.9; uric acid 9.7     Review of Systems  Constitutional: Negative for malaise/fatigue.  Respiratory: Negative for cough and shortness of breath.   Cardiovascular: Negative for chest pain, palpitations and leg swelling.  Gastrointestinal: Positive for abdominal pain. Negative for constipation and heartburn.       New colostomy Has bilateral knee pain   Musculoskeletal: Negative for back pain,myalgias.  Skin: Negative.   Neurological: Negative for dizziness.  Psychiatric/Behavioral: The patient is not nervous/anxious.     Physical Exam  Constitutional: He is oriented to person, place, and time. No distress.  Obese   Eyes: Conjunctivae are normal.  Neck: Neck supple. No JVD present. No thyromegaly  present.  Cardiovascular: Normal rate, regular rhythm and intact distal pulses.   Respiratory: Effort normal and breath sounds normal. No respiratory distress. He has no wheezes.  GI: Soft. Bowel sounds are normal. He exhibits no distension. There is no tenderness.  Has colostomy present with stool in bag   Genitourinary:  Genitourinary Comments: Foley present   Musculoskeletal: He exhibits no edema.  Able to move all extremities Has tenderness to knees.    Lymphadenopathy:    He has no cervical adenopathy.  Neurological: He is alert and oriented to person, place, and time.  Skin: Skin is warm and dry. He is not diaphoretic.  Psychiatric: He has a normal mood and affect.     ASSESSMENT/ PLAN:  1. Acute gout: uric acid 9.7: will begin colchicine 0.6 mg twice daily for one week and will begin allopurinol 100 mg daily      MD is aware of resident's narcotic use and is in agreement with current plan of care. We will attempt to wean resident as apropriate     Ok Edwards NP [facility]  Contact [phone] Monday through Friday 8am- 5pm  After hours call [phone]

## 2016-10-17 NOTE — Progress Notes (Signed)
[place]  [phone] [phone]  Clinic Follow up Note   Patient Care Team: Mayo, Pete Pelt, MD as PCP - General (Family Medicine) Kyung Rudd, MD as Consulting Physician (Radiation Oncology) Truitt Merle, MD as Consulting Physician (Hematology) Minus Breeding, MD as Consulting Physician (Cardiology) Michael Boston, MD as Consulting Physician (General Surgery) Ardis Hughs, MD as Attending Physician (Urology) Saporito, Maree Erie, LCSW as Clarissa, Deville, DO as Consulting Physician (Internal Medicine) 10/19/2016   CHIEF COMPLAINTS:  Follow up rectal cancer  Oncology History   Cancer Staging 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 Diverticulosis in the sigmoid colon.      04/01/2016 Initial Biopsy    Rectal mass biopsy showed adenocarcinoma, grade 2      04/04/2016 Imaging    CT chest, abdomen and pelvis with contrast showed no primary rectal carcinoma is not well visualized, 7 mm left posterior perirectal and presacral lymph nodes, indeterminate 1.4 cm low to attenuation lesion in the left hepatic lobe, question hepatic cirrhosis, recommend abdominal MRI for further evaluation. No other evidence of distant metastasis.      04/17/2016 Imaging    MR abdomen w w/o contrast IMPRESSION: 1. Morphologic features of liver compatible cirrhosis. 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 2. There is a complex lesion within the left kidney which is only partially visualized on this study which was tailored to assess the liver. When compared with study from 04/04/2016 this is favored to represent a small solid enhancing neoplasm, i.e. renal cell carcinoma. Urologic consultation is recommended. 3. The indeterminate lesion within the left lobe of liver described on recent CT remains indeterminate. This large reflects motion artifact which degrades the post-contrast sequences. There are no specific findings associated with this lesion to suggest [place] however correlation with AFP levels would be advised. Additionally, in light of the patient's increased risk for hepatoma and liver metastases, followup imaging with repeat contrast enhanced MRI abdomen in 3-6 months is advised to ensure stability of this lesion.      05/04/2016 Imaging    MR Pelvis w w/o contrast 05/04/16 IMPRESSION: Low rectal adenocarcinoma, T2 N1 MX by imaging. Distance from inferior tumor margin to anorectal junction is <1 cm.      05/12/2016 - 06/28/2016 Radiation Therapy    Concurrent chemoradiation to his rectal cancer      05/12/2016 - 06/28/2016 Chemotherapy    Xeloda 2000 mg in morning and 1500 mg in the evening, held on 05/29/2016 due to rectal bleeding and a cytopenia, restarted at a low dose (1056m am and 500 pm) on 06/20/16        05/29/2016 - 06/02/2016 Hospital Admission    The patient was admitted for rectal bleeding, chemotherapy Xeloda was held, he continued radiation. He did not require blood transfusion. Bleeding spontaneously improved.      09/28/2016 Surgery    XI ROBOT ABDOMINOPERINEAL  RESECTION  PERMANENT COLOSTOMY  By Dr. Johney Maine      09/28/2016 Pathology Results    Diagnosis 1. Colon, segmental resection for tumor, and rectum, anus - INVASIVE ADENOCARCINOMA, MODERATELY DIFFERENTIATED, SPANNING 3.1 CM. - TUMOR INVADES INTO PERICOLORECTAL  TISSUE. - TUMOR FOCALLY INVOLVES ORIGINAL RADIAL MARGIN (FINAL MARGINS PARTS #2 AND #3 ARE NEGATIVE). - DIVERTICULOSIS. - METASTATIC CARCINOMA IN TWO OF SIXTEEN LYMPH NODES (2/16). - SEE ONCOLOGY TABLE. 2. Soft tissue, biopsy, right posterior lateral margin - BENIGN FIBROADIPOSE TISSUE AND SKELETAL MUSCLE. - NO MALIGNANCY IDENTIFIED. 3. Soft tissue, biopsy, right anterior lateral margin - BENIGN SKELETAL MUSCLE AND SOFT TISSUE. - NO MALIGNANCY IDENTIFIED.       HISTORY OF PRESENTING ILLNESS (04/15/2016):  Patrick [name] 66 y.o. male is here because of his recent diagnosed rectal cancer. He presents to our multidisciplinary GI clinic by himself today.  He has had intermittent rectal bleeding for a few years, which she contributes to hemorrhoids, slightly worse lately. He used to have regular bowel movement twice a day, which has been more irregular lately, and occasional loose or watery bowel movement. No significant rectal pain. He was seen by his urologist for routine follow-up of his BPH, and on the rectal exam rectal mass was palpated, and he was referred to colorectal surgeon Dr. Marcello Moores. Colonoscopy on 04/01/2016 showed a fungating nonobstructing medium-sized mass in the distal rectum, 3 cm in length. Biopsy showed adenocarcinoma grade 2. CT CAP was negative distant metastasis, except a 1.4cm indeterminate lesion in the liver.  Review of systems also reviewed slightly decreased appetite, no signal weight loss. No other pain, nausea, or other symptoms. He has significant cardiomyopathy, nonischemic, has orthopnea, mild, able to function and tolerating routine activity at home. He is the primary caregiver of his sister, who is continuous nursing home. He is single, no children. Retired. No family history of colon cancer.  CURRENT THERAPY: recovering from surgery    INTERIM HISTORY:  Patrick [name] returns for follow-up. He presents to the clinic poast surgery today by himself and dropped off by  nursing facility. He reports going to hospital May 2nd and checked himself out. He felt "woosy" and then had trouble urinating. He stayed in the hospital for 4 days and came back to hospital for renal injury. He is not seeing urologist for catheter yet. He thinks his catheter has been over looked. They did not remove his catheter before leaving. He spoke with Dr. Marcello Moores with him and told him to keep it in for two weeks and it has been longer than that. He saw Dr. Johney Maine two days ago. He is on Antibiotics. He could not walk or stand when he last left the hospital due to extreme pain in his knees. He thinks it is gout. He wonders why this increase in pain and problems after surgery.   He says the home does not give him enough to drink with his meals and he does get water when he asks. His cardiologist previously told him to slow down on fluid intake, but not for surgery purposes. He does PT once a day for about an hour. He is not well enough to leave reliability center. Finally yesterday he was able to stand up again and do some steps today.    MEDICAL HISTORY:  Past Medical History:  Diagnosis Date  . Arthritis    "right hand; right knee" (06/19/2014)  . CAD (coronary artery disease)    2v CAD with subtotal occlusion of small co-dominant  RCA and borderline lesion in moderate-sized OM-1  . CHF (congestive heart failure) (HCC)    Preserved EF  . Coronary artery disease involving native coronary artery of native heart with angina pectoris ([place]) 09/11/2016  . Degenerative joint disease of knee, right   . Dysrhythmia   . Enlarged prostate   . Hyperlipidemia   . Hypertension   . Pneumonia 05/2014  . Prediabetes 10/03/2014  . Scrotal edema 04/03/2015  . SMALL BOWEL OBSTRUCTION, HX OF 08/21/2007   Annotation: with narrowing in the ileocecal region Qualifier: Diagnosis of  By: Hassell Done FNP, Tori Milks      SURGICAL HISTORY: Past Surgical History:  Procedure Laterality Date  . CARDIAC CATHETERIZATION N/A  02/16/2016   Procedure: Right/Left Heart Cath and Coronary Angiography;  Surgeon: Jolaine Artist, MD;  Location: [facility];  Service: Cardiovascular;  Laterality: N/A;  . COLONOSCOPY N/A 04/01/2016   Procedure: COLONOSCOPY;  Surgeon: Leighton Ruff, MD;  Location: [facility];  Service: Endoscopy;  Laterality: N/A;  . INGUINAL HERNIA REPAIR Left 1990's  . IR GENERIC HISTORICAL  07/05/2016   IR RADIOLOGIST EVAL & MGMT 07/05/2016 Corrie Mckusick, DO [facility] INTERV RAD  . XI ROBOT ABDOMINAL PERINEAL RESECTION N/A 09/28/2016   Procedure: XI ROBOT ABDOMINOPERINEAL RESECTION WITH PERMANENT COLOSTOMY ERAS PATHWAY;  Surgeon: Michael Boston, MD;  Location: [facility];  Service: General;  Laterality: N/A;    SOCIAL HISTORY: Social History   Social History  . Marital status: Single    Spouse name: N/A  . Number of children: N/A  . Years of education: N/A   Occupational History  . Not on file.   Social History Main Topics  . Smoking status: Former Smoker    Years: 5.00    Types: Pipe, Landscape architect  . Smokeless tobacco: Never Used     Comment: 06/19/2014 "stopped smoking in ~ 2014; used to smoke a pipe or cigar a couple times/month"  . Alcohol use No  . Drug use: No  . Sexual activity: No   Other Topics Concern  . Not on file   Social History Narrative   Single, lives w/his sister of whom he is caregiver       FAMILY HISTORY: Family History  Problem Relation Age of Onset  . Hypertension Mother   . Diabetes Mother   . Stroke Mother   . Cancer Father 58       Prostate  . Dementia Father   . COPD Sister   . Arthritis Sister   . Edema Sister     ALLERGIES:  is allergic to nsaids.  MEDICATIONS:  Current Outpatient Prescriptions  Medication Sig Dispense Refill  . acetaminophen (TYLENOL) 500 MG tablet Take 1,000 mg by mouth every 6 (six) hours as needed (for pain/fever/headaches.).     Marland Kitchen allopurinol (ZYLOPRIM) 100 MG tablet Take 100 mg by mouth daily.    . Ascorbic Acid (VITAMIN [name]) 1000  MG tablet Take 2,000 mg by mouth 2 (two) times daily.    Marland Kitchen aspirin EC 81 MG EC tablet Take 1 tablet (81 mg total) by mouth daily. 30 tablet 0  . carvedilol (COREG) 25 MG tablet Take 1 tablet (25 mg total) by mouth 2 (two) times daily. 180 tablet 3  . colchicine 0.6 MG tablet Take 1 tablet by mouth 2 (two) times daily.  0  . ferrous sulfate 325 (65 FE) MG tablet take 1 tablet by mouth once daily WITH BREAKFAST 30 tablet 3  . finasteride (PROSCAR) 5 MG tablet take  1 tablet by mouth once daily 90 tablet 1  . ipratropium-albuterol (DUONEB) 0.5-2.5 (3) MG/3ML SOLN Take 3 mLs by nebulization every 4 (four) hours as needed. 360 mL 0  . lovastatin (MEVACOR) 20 MG tablet take 1 tablet by mouth at bedtime 90 tablet 1  . Multiple Vitamins-Minerals (MULTIVITAMIN WITH MINERALS) tablet Take 1 tablet by mouth daily. Men's 50+ Multivitamin    . omega-3 acid ethyl esters (LOVAZA) 1 g capsule take 2 capsules by mouth twice a day 360 capsule 1  . OxyCODONE HCl, Abuse Deter, (OXAYDO) 5 MG TABA Give 1-2 tablets by mouth every 4 hours as needed for Moderate to Severe pain.  Hold for sedation or respiratory depression.    . pantoprazole (PROTONIX) 40 MG tablet Take 1 tablet (40 mg total) by mouth daily. 30 tablet 0  . potassium chloride SA (K-DUR,KLOR-CON) 20 MEQ tablet Take 60 mEq by mouth daily.    Marland Kitchen sulfamethoxazole-trimethoprim (BACTRIM DS,SEPTRA DS) 800-160 MG tablet Take 1 tablet by mouth 2 (two) times daily.    . tamsulosin (FLOMAX) 0.4 MG CAPS capsule Take 0.4 mg by mouth daily.     Marland Kitchen torsemide (DEMADEX) 20 MG tablet Take 40 mg by mouth daily after supper.     . traZODone (DESYREL) 100 MG tablet Take 300 mg by mouth at bedtime.   0  . Menthol, Topical Analgesic, (BIOFREEZE ROLL-ON) 4 % GEL Apply to bilateral knees topically every 12 hours as needed for pain    . methocarbamol (ROBAXIN) 750 MG tablet Take 1 tablet (750 mg total) by mouth 4 (four) times daily as needed (use for muscle cramps/pain). (Patient not  taking: Reported on 10/19/2016) 30 tablet 2  . mirtazapine (REMERON) 30 MG tablet Take 1 tablet (30 mg total) by mouth at bedtime. 30 tablet 1  . nitroGLYCERIN (NITROSTAT) 0.4 MG SL tablet Place 1 tablet (0.4 mg total) under the tongue every 5 (five) minutes as needed for chest pain. (Patient not taking: Reported on 10/19/2016) 25 tablet 3   No current facility-administered medications for this visit.     REVIEW OF SYSTEMS:   Constitutional: Denies fevers, chills or abnormal night sweats (+) fatigue, weakness Eyes: Denies blurriness of vision, double vision or watery eyes Ears, nose, mouth, throat, and face: Denies mucositis or sore throat Respiratory: Denies cough, dyspnea or wheezes (+) SOB on exertion. Cardiovascular: Denies palpitation, chest discomfort or lower extremity swelling Gastrointestinal:  Denies heartburn. Skin: Denies abnormal skin rashes Lymphatics: Denies new lymphadenopathy or easy bruising Neurological:Denies numbness, tingling or new weaknesses MSK: (+) sever bilateral knee pain, trouble walking  Urinary: (+) Trouble urinating, UTI Behavioral/Psych: Mood is stable, no new changes  All other systems were reviewed with the patient and are negative.  PHYSICAL EXAMINATION: ECOG PERFORMANCE STATUS: 1 - Symptomatic but completely ambulatory  Vitals:   10/19/16 1128  BP: 113/79  Pulse: 69  Resp: 18  Temp: 97.7 F (36.5 [name])   Filed Weights    GENERAL:alert, no distress and comfortable SKIN: skin color, texture, turgor are normal, no rashes or significant lesions (+) appetite improving  EYES: normal, conjunctiva are pink and non-injected, sclera clear OROPHARYNX:no exudate, no erythema and lips, buccal mucosa, and tongue normal  NECK: supple, thyroid normal size, non-tender, without nodularity LYMPH:  no palpable lymphadenopathy in the cervical, axillary or inguinal LUNGS: clear to auscultation and percussion with normal breathing effort HEART: regular rate &  rhythm and no murmurs and no lower extremity edema ABDOMEN:abdomen soft, non-tender and normal bowel sounds,  rectal exam showed a firm mass in the low rectum anteriorly, no bleeding. Bilateral inguinal lymph nodes were negative. (+) colostomy bag in Left mid abdomen looks well MSK: (+) extreme knee pain (gout) Musculoskeletal:no cyanosis of digits and no clubbing  PSYCH: alert & oriented x 3 with fluent speech NEURO: no focal motor/sensory deficits  LABORATORY DATA:  I have reviewed the data as listed CBC Latest Ref Rng & Units 10/19/2016 10/14/2016 10/13/2016  WBC 4.0 - 10.3 10e3/uL 5.5 7.3 7.1  Hemoglobin 13.0 - 17.1 g/dL 9.5(L) 9.9(A) 10.0(A)  Hematocrit 38.4 - 49.9 % 30.5(L) 30(A) 31(A)  Platelets 140 - 400 10e3/uL 185 150 166   CMP Latest Ref Rng & Units 10/19/2016 10/14/2016 10/13/2016  Glucose 70 - 140 mg/dl 190(H) - -  BUN 7.0 - 26.0 mg/dL 19.4 21 23(A)  Creatinine 0.7 - 1.3 mg/dL 1.7(H) 1.4(A) 1.6(A)  Sodium 136 - 145 mEq/L 143 143 145  Potassium 3.5 - 5.1 mEq/L 3.8 3.4 3.5  Chloride 101 - 111 mmol/L - - -  CO2 22 - 29 mEq/L 33(H) - -  Calcium 8.4 - 10.4 mg/dL 8.9 - -  Total Protein 6.4 - 8.3 g/dL 7.2 - -  Total Bilirubin 0.20 - 1.20 mg/dL 0.72 - -  Alkaline Phos 40 - 150 U/L 232(H) - 154(A)  AST 5 - 34 U/L 65(H) - 45(A)  ALT 0 - 55 U/L 92(H) - 72(A)   PATHOLOGY REPORT  Diagnosis 04/01/2016 Rectum, biopsy, mass INVASIVE ADENOCARCINOMA, GRADE 2 MARGIN OF RESECTION IS POSITIVE FOR ADENOCARCINOMA  Diagnosis 09/28/16 1. Colon, segmental resection for tumor, and rectum, anus - INVASIVE ADENOCARCINOMA, MODERATELY DIFFERENTIATED, SPANNING 3.1 CM. - TUMOR INVADES INTO PERICOLORECTAL TISSUE. - TUMOR FOCALLY INVOLVES ORIGINAL RADIAL MARGIN (FINAL MARGINS PARTS #2 AND #3 ARE NEGATIVE). - DIVERTICULOSIS. - METASTATIC CARCINOMA IN TWO OF SIXTEEN LYMPH NODES (2/16). - SEE ONCOLOGY TABLE. 2. Soft tissue, biopsy, right posterior lateral margin - BENIGN FIBROADIPOSE TISSUE AND SKELETAL  MUSCLE. - NO MALIGNANCY IDENTIFIED. 3. Soft tissue, biopsy, right anterior lateral margin - BENIGN SKELETAL MUSCLE AND SOFT TISSUE. - NO MALIGNANCY IDENTIFIED. Microscopic Comment 1. COLON AND RECTUM (INCLUDING TRANS-ANAL RESECTION): Specimen: Sigmoid colon, rectum, anus. Procedure: Segmental resection. Tumor site: Distal third rectum. Specimen integrity: Intact. Macroscopic intactness of mesorectum: Near complete. Macroscopic tumor perforation: Not identified. Invasive tumor: Maximum size: 3.1 cm. Histologic type(s): Invasive adenocarcinoma. Histologic grade and differentiation: G2: moderately differentiated/low grade Type of polyp in which invasive carcinoma arose: N/A. Microscopic extension of invasive tumor: Tumor invades into pericolorectal tissue. Lymph-Vascular invasion: Definitive invasion not identified. Peri-neural invasion: Present. 1 of 3 FINAL for Huertas, Patrick [name] ([id number]) Microscopic Comment(continued) Tumor deposit(s) (discontinuous extramural extension): Not identified. Resection margins: Proximal margin: Negative. Distal margin: Negative. Circumferential (radial) (posterior ascending, posterior descending; lateral and posterior mid-rectum; and entire lower 1/3 rectum): Focally positive. Mesenteric margin (sigmoid and transverse): N/A. Distance closest margin (if all above margins negative): N/A. Treatment effect (neo-adjuvant therapy): Minimally present. (TRS 3). Additional polyp(s): None. Non-neoplastic findings: Diverticulosis. Lymph nodes: number examined 16; number positive: 2 Pathologic Staging: ypT3, ypN1b, ypMX Ancillary studies: Can be ordered upon request. Comment: There is a focus of tumor extending to the inked resection margin in the main specimen (confirmed on deeper levels), however, additional margins (parts #2 and #3) are negative.  MMR-normal   RADIOGRAPHIC STUDIES: I have personally reviewed the radiological images as listed and agreed  with the findings in the report. Dg Chest 2 View  Result Date: 10/06/2016 CLINICAL DATA:  Chest  pain. EXAM: CHEST  2 VIEW COMPARISON:  Abdominal radiograph 05/29/2016 FINDINGS: Cardiomegaly, with prominence of the epicardial fat pad. No evidence pulmonary edema. Mild central bronchial thickening. No consolidation, pleural effusion or pneumothorax. No acute osseous abnormalities. IMPRESSION: Cardiomegaly without acute abnormality. Electronically Signed   By: Jeb Levering M.D.   On: 10/06/2016 21:04   Dg Knee 2 Views Right  Result Date: 10/06/2016 CLINICAL DATA:  Knee pain EXAM: RIGHT KNEE - 1-2 VIEW COMPARISON:  None. FINDINGS: The bones are osteopenic. There is medial compartment predominant narrowing of the femorotibial joint space, though this may be exaggerated by positioning. No fracture or dislocation. Narrowing of the patellofemoral joint space with superior patellar enthesophyte. IMPRESSION: Mild right knee osteoarthrosis without acute osseous abnormality. Electronically Signed   By: Ulyses Jarred M.D.   On: 10/06/2016 19:47   US Renal  Result Date: 10/06/2016 CLINICAL DATA:  Acute renal failure. EXAM: RENAL / URINARY TRACT ULTRASOUND COMPLETE COMPARISON:  MRI abdomen 04/17/2016. CT abdomen and pelvis 04/04/2016. FINDINGS: Right Kidney: Length: 13.3 cm. Multiple small cysts are demonstrated, mostly in the parapelvic region of the right kidney. Largest measures about 1.4 cm in diameter. Appearance is likely represent benign cysts. Similar changes were seen on previous MRI. Mild renal parenchymal thinning. No hydronephrosis. Left Kidney: Length: 13.5 cm. Mild renal parenchymal thinning. No hydronephrosis. Small simple appearing cyst demonstrated in the mid pole. Bladder: Appears normal for degree of bladder distention. Bladder is moderately distended with a prevoid volume of 1.3 L. Patient is unable to void. IMPRESSION: Small bilateral renal cysts with mild parenchymal thinning. No  hydronephrosis. Bladder is distended and patient is unable to void. Electronically Signed   By: Lucienne Capers M.D.   On: 10/06/2016 23:17   Dg Chest Port 1 View  Result Date: 10/08/2016 CLINICAL DATA:  Acute chest pain. EXAM: PORTABLE CHEST 1 VIEW COMPARISON:  10/06/2016 and prior exams FINDINGS: Cardiomegaly noted. There is no evidence of focal airspace disease, pulmonary edema, suspicious pulmonary nodule/mass, pleural effusion, or pneumothorax. No acute bony abnormalities are identified. IMPRESSION: Cardiomegaly without evidence of acute cardiopulmonary disease. Electronically Signed   By: Margarette Canada M.D.   On: 10/08/2016 21:16   Dg Foot 2 Views Right  Result Date: 10/06/2016 CLINICAL DATA:  Right foot pain EXAM: RIGHT FOOT - 2 VIEW COMPARISON:  None. FINDINGS: No fracture. No subluxation or dislocation. Degenerative changes noted in the great toe MTP joint with associated hallux valgus deformity. IMPRESSION: Degenerative changes in the great toe.  No acute bony abnormality. Electronically Signed   By: Misty Stanley M.D.   On: 10/06/2016 19:41   COLONOSCOPY 04/01/2016 Dr. Marcello Moores  A fungating non-obstructing medium-sized mass was found in the distal rectum. The mass was noncircumferential. The mass measured three cm in length. No bleeding was present. Biopsies were taken with a cold forceps for histology. IMPRESSION  - Diverticulosis in the sigmoid colon. 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 Biopsied.  ASSESSMENT & PLAN: 66 y.o. Caucasian male, with past medical history of hypertension, chronic systolic heart failure with EF of 20-25%, iron deficient anemia, presented with a palpable rectal mass.  1. Adenoma of rectum, low anterior rectum, cT2N1M0, stage IIIA -I previously reviewed his CT scan, endoscopy, and biopsy results in details with patient -His CT scan reviewed no definitive evidence of distant metastasis, however he a 1.4 cm indeterminate liver lesion, need further workup.    -His pelvic MRI showed T2N1 disease, I reviewed with patient -His abdominal MRI showed indeterminate  liver lesion, needs close follow-up. He also has imaging evidence of liver cirrhosis, unclear etiology. AFP was normal  -He underwent neoadjuvant chemoradiation, tolerated well. However due to his significant cytopenia, Xeloda was held and the dose reduced significantly. -He finally underwent APR, surgery was somewhat delayed due to his compliance issue and switching of his surgeon. -I discussed his surgical pathology findings, which showed residual T3 lesion and 2/16 positive lymph nodes. Surgical margins were negative. -I discussed the high risk of recurrence due to his locally advanced disease. I recommend adjuvant chemotherapy, however he has been recovering poorly from his surgery 3 weeks ago, he has strongly to go to recover well enough for adjuvant chemotherapy. The standard adjuvant chemotherapy is FOLFOX, if he does not recover completely but well enough to try chemotherapy, I may also consider single agent Xeloda. -The discussed adjuvant chemotherapy should started within the first 3 months after surgery, ideally within the first 6-8 weeks. -Patient has had no loss lately, was not a very engaged in conversation during his visit today, I'm not sure how much he grasped the above the above issues we discussed today, will discuss again on his next visit  -I'll see him back in 3 weeks to evaluate his candidacy for adjuvant chemotherapy.  2. HTN, chronic systolic heart failure with EF 20-25% -He will continue follow-up with his primary care physician and cardiologist -He will follow-up with his cardiologist closely -Creatinine 1.7 today, worse than before   3. Iron deficient anemia -His on study in 2016 was consistent with iron deficiency -He is on oral iron, we'll continue -Iron on 06/20/16 was 56.  -He received a blood transfusion on 10/03/16 -He is taking 1 iron pill currently so I will  increase his iron to 2 tablets daily.   4. Liver lesion -He has a 1.4 cm indeterminate liver lesion in the left lobe, found on his staging CT scan, and was evaluated by MRI, no typical MRI image characteristics of hepatocellular carcinoma, however image quality was limited. Giving the imaging evidence of liver cirrhosis, I'll check AFP. -MRI of the abd on 04/17/16 shows the liver lesion remains indeterminate. Will repeat in the future.  5. Catheter/UTI -Placed over 3 weeks ago and I will f/u with Dr. Johney Maine and urologist to get this removed when he is ready.  6. Knee Pain -He has extreme knee pain in both knees and has trouble walking, being managed by MD at his rehab  -He will f/u with PCP  7. Depression -He admits to depression and has not told his sister about it.  -He has had suicidal thought previously but has reported he no longer feels that way.  -Mirtazapine is not doing anything for him. He feels it is not given to him properly, he would like to change to regular pill.  -I  will increasing his mirtazapine dose from 50 mg to 30 mg at bedtime  8. Memory issue -Patient has developed significant memory loss lately.    Plan -labs and f/u in 3 weeks -I encouraged him to continue rehabilitation, and take nutrition supplement  -refilled and increased Mirtazapine  -increase iron pill to 2 tablets daily.  -I spoke with Dr. Johney Maine about his issue with foley, he will follow up with his urologist    All questions were answered. The patient knows to call the clinic with any problems, questions or concerns. I spent 30 minutes counseling the patient face to face. The total time spent in the appointment was 40 minutes  and more than 50% was on counseling.  This document serves as a record of services personally performed by Truitt Merle, MD. It was created on her behalf by Joslyn Devon, a trained medical scribe. The creation of this record is based on the scribe's personal observations and the  provider's statements to them. This document has been checked and approved by the attending provider.    Truitt Merle, MD 10/19/2016

## 2016-10-19 ENCOUNTER — Telehealth: Payer: Self-pay | Admitting: Hematology

## 2016-10-19 ENCOUNTER — Ambulatory Visit: Payer: Medicaid Other | Admitting: Podiatry

## 2016-10-19 ENCOUNTER — Other Ambulatory Visit (HOSPITAL_BASED_OUTPATIENT_CLINIC_OR_DEPARTMENT_OTHER): Payer: Medicare Other

## 2016-10-19 ENCOUNTER — Encounter: Payer: Self-pay | Admitting: Hematology

## 2016-10-19 ENCOUNTER — Ambulatory Visit (HOSPITAL_BASED_OUTPATIENT_CLINIC_OR_DEPARTMENT_OTHER): Payer: Medicare Other | Admitting: Hematology

## 2016-10-19 VITALS — BP 113/79 | HR 69 | Temp 97.7°F | Resp 18 | Ht 69.0 in

## 2016-10-19 DIAGNOSIS — D500 Iron deficiency anemia secondary to blood loss (chronic): Secondary | ICD-10-CM | POA: Diagnosis not present

## 2016-10-19 DIAGNOSIS — C20 Malignant neoplasm of rectum: Secondary | ICD-10-CM

## 2016-10-19 DIAGNOSIS — I10 Essential (primary) hypertension: Secondary | ICD-10-CM

## 2016-10-19 DIAGNOSIS — I5022 Chronic systolic (congestive) heart failure: Secondary | ICD-10-CM

## 2016-10-19 LAB — CBC WITH DIFFERENTIAL/PLATELET
BASO%: 0.2 % (ref 0.0–2.0)
Basophils Absolute: 0 10*3/uL (ref 0.0–0.1)
EOS%: 2 % (ref 0.0–7.0)
Eosinophils Absolute: 0.1 10*3/uL (ref 0.0–0.5)
HCT: 30.5 % — ABNORMAL LOW (ref 38.4–49.9)
HGB: 9.5 g/dL — ABNORMAL LOW (ref 13.0–17.1)
LYMPH#: 0.4 10*3/uL — AB (ref 0.9–3.3)
LYMPH%: 7.1 % — AB (ref 14.0–49.0)
MCH: 26.8 pg — AB (ref 27.2–33.4)
MCHC: 31.1 g/dL — AB (ref 32.0–36.0)
MCV: 85.9 fL (ref 79.3–98.0)
MONO#: 0.4 10*3/uL (ref 0.1–0.9)
MONO%: 7.2 % (ref 0.0–14.0)
NEUT#: 4.6 10*3/uL (ref 1.5–6.5)
NEUT%: 83.5 % — AB (ref 39.0–75.0)
Platelets: 185 10*3/uL (ref 140–400)
RBC: 3.55 10*6/uL — AB (ref 4.20–5.82)
RDW: 16 % — AB (ref 11.0–14.6)
WBC: 5.5 10*3/uL (ref 4.0–10.3)

## 2016-10-19 LAB — IRON AND TIBC
%SAT: 15 % — ABNORMAL LOW (ref 20–55)
Iron: 29 ug/dL — ABNORMAL LOW (ref 42–163)
TIBC: 191 ug/dL — ABNORMAL LOW (ref 202–409)
UIBC: 162 ug/dL (ref 117–376)

## 2016-10-19 LAB — COMPREHENSIVE METABOLIC PANEL
ALT: 92 U/L — AB (ref 0–55)
AST: 65 U/L — ABNORMAL HIGH (ref 5–34)
Albumin: 2.4 g/dL — ABNORMAL LOW (ref 3.5–5.0)
Alkaline Phosphatase: 232 U/L — ABNORMAL HIGH (ref 40–150)
Anion Gap: 12 mEq/L — ABNORMAL HIGH (ref 3–11)
BILIRUBIN TOTAL: 0.72 mg/dL (ref 0.20–1.20)
BUN: 19.4 mg/dL (ref 7.0–26.0)
CHLORIDE: 99 meq/L (ref 98–109)
CO2: 33 meq/L — AB (ref 22–29)
CREATININE: 1.7 mg/dL — AB (ref 0.7–1.3)
Calcium: 8.9 mg/dL (ref 8.4–10.4)
EGFR: 41 mL/min/{1.73_m2} — AB (ref >90)
GLUCOSE: 190 mg/dL — AB (ref 70–140)
Potassium: 3.8 mEq/L (ref 3.5–5.1)
SODIUM: 143 meq/L (ref 136–145)
TOTAL PROTEIN: 7.2 g/dL (ref 6.4–8.3)

## 2016-10-19 LAB — CEA (IN HOUSE-CHCC): CEA (CHCC-In House): 1.5 ng/mL (ref 0.00–5.00)

## 2016-10-19 LAB — FERRITIN: Ferritin: 230 ng/ml (ref 22–316)

## 2016-10-19 MED ORDER — MIRTAZAPINE 30 MG PO TABS: 30.0000 mg | ORAL_TABLET | Freq: Every day | ORAL | 1 refills | Status: DC

## 2016-10-19 NOTE — Telephone Encounter (Signed)
Gave patient AVS and calender per 5/23 LOS - lab and f/u in 3 weeks .

## 2016-10-20 ENCOUNTER — Encounter: Payer: Self-pay | Admitting: Hematology

## 2016-10-20 ENCOUNTER — Inpatient Hospital Stay: Payer: Medicare Other | Admitting: Internal Medicine

## 2016-10-21 ENCOUNTER — Other Ambulatory Visit: Payer: Self-pay | Admitting: *Deleted

## 2016-10-21 NOTE — Patient Outreach (Signed)
[facility]) Care Management  10/21/2016  JAYCEION LISENBY Jul 16, 1950 [id number]   Patient reports being able to finally get up and work with therapies (both physical and occupational) today at [facility] [place] where patient currently resides to receive short-term rehabilitative services, after having been immobile for several weeks.  Patient reported that he has not received word as to when he will be moved to a [place] bed, aware that his sister, Jaire Pinkham is scheduled to be moved next week.  The plan is for patient to also move to a [place] bed, under his [place] Medicaid benefit, to continue to gain strength, conditioning, mobility, safety and ambulation.  CSW will continue to follow patient while at [place] to assess and assist with social work needs and services.  Patient will meet with patient and patient's sister, Erron Wengert at [facility] on Friday, June 8th.  Patient appeared to be feeling much better today, and in good spirits.   [name], BSW, MSW, LCSW  Licensed Education officer, environmental [facility] System  Mailing [place] [address], [place], [place] [address] Physical Address-[address], [place], [place] [address] Toll Free Main # [phone] Fax # [phone] Cell # [phone]  Office # [phone] [email or web address]

## 2016-10-24 DIAGNOSIS — M10369 Gout due to renal impairment, unspecified knee: Secondary | ICD-10-CM | POA: Insufficient documentation

## 2016-10-31 NOTE — Addendum Note (Signed)
Addendum  created 10/31/16 1413 by Myrtie Soman, MD   Sign clinical note

## 2016-10-31 NOTE — Anesthesia Postprocedure Evaluation (Signed)
Anesthesia Post Note  Patient: Patrick [name]  Procedure(s) Performed: Procedure(s) (LRB): XI ROBOT ABDOMINOPERINEAL RESECTION WITH PERMANENT COLOSTOMY ERAS PATHWAY (N/A)     Anesthesia Post Evaluation  Last Vitals:  Vitals:   10/01/16 2205 10/02/16 0448  BP: (!) 154/89 (!) 147/87  Pulse: (!) 56 72  Resp: 18 17  Temp: 36.6 C 36.7 C    Last Pain:  Vitals:   10/02/16 1213  TempSrc:   PainSc: 0-No pain                 Zara Wendt S

## 2016-11-01 ENCOUNTER — Non-Acute Institutional Stay (SKILLED_NURSING_FACILITY): Payer: Medicare Other | Admitting: Adult Health

## 2016-11-01 ENCOUNTER — Encounter: Payer: Self-pay | Admitting: Adult Health

## 2016-11-01 DIAGNOSIS — M109 Gout, unspecified: Secondary | ICD-10-CM

## 2016-11-01 NOTE — Progress Notes (Signed)
Location:   [facility] Room Number: 118 A Place of Service:  SNF (31)   CODE STATUS: Full Code  Allergies  Allergen Reactions  . Nsaids     Kidney disease    Chief Complaint  Patient presents with  . Acute Visit    Gout    HPI:  He is taking allopurinol 100 mg daily to manage his gout. He has completed his colchicine . He is still having pain which is interfering with his ability to participate in therapy.    Past Medical History:  Diagnosis Date  . Arthritis    "right hand; right knee" (06/19/2014)  . CAD (coronary artery disease)    2v CAD with subtotal occlusion of small co-dominant RCA and borderline lesion in moderate-sized OM-1  . CHF (congestive heart failure) (HCC)    Preserved EF  . Coronary artery disease involving native coronary artery of native heart with angina pectoris ([place]) 09/11/2016  . Degenerative joint disease of knee, right   . Dysrhythmia   . Enlarged prostate   . Hyperlipidemia   . Hypertension   . Pneumonia 05/2014  . Prediabetes 10/03/2014  . Scrotal edema 04/03/2015  . SMALL BOWEL OBSTRUCTION, HX OF 08/21/2007   Annotation: with narrowing in the ileocecal region Qualifier: Diagnosis of  By: Hassell Done FNP, Tori Milks      Past Surgical History:  Procedure Laterality Date  . CARDIAC CATHETERIZATION N/A 02/16/2016   Procedure: Right/Left Heart Cath and Coronary Angiography;  Surgeon: Jolaine Artist, MD;  Location: [facility];  Service: Cardiovascular;  Laterality: N/A;  . COLONOSCOPY N/A 04/01/2016   Procedure: COLONOSCOPY;  Surgeon: Leighton Ruff, MD;  [facility];  Service: Endoscopy;  Laterality: N/A;  . INGUINAL HERNIA REPAIR Left 1990's  . IR GENERIC HISTORICAL  07/05/2016   IR RADIOLOGIST EVAL & MGMT 07/05/2016 Corrie Mckusick, DO [facility] INTERV RAD  . XI ROBOT ABDOMINAL PERINEAL RESECTION N/A 09/28/2016   Procedure: XI ROBOT ABDOMINOPERINEAL RESECTION WITH PERMANENT COLOSTOMY ERAS PATHWAY;  Surgeon: Michael Boston, MD;   [facility]: [facility];  Service: General;  Laterality: N/A;    Social History   Social History  . Marital status: Single    Spouse name: N/A  . Number of children: N/A  . Years of education: N/A   Occupational History  . Not on file.   Social History Main Topics  . Smoking status: Former Smoker    Years: 5.00    Types: Pipe, Landscape architect  . Smokeless tobacco: Never Used     Comment: 06/19/2014 "stopped smoking in ~ 2014; used to smoke a pipe or cigar a couple times/month"  . Alcohol use No  . Drug use: No  . Sexual activity: No   Other Topics Concern  . Not on file   Social History Narrative   Single, lives w/his sister of whom he is caregiver      Family History  Problem Relation Age of Onset  . Hypertension Mother   . Diabetes Mother   . Stroke Mother   . Cancer Father 102       Prostate  . Dementia Father   . COPD Sister   . Arthritis Sister   . Edema Sister       VITAL SIGNS BP 132/76   Pulse 80   Temp 98.8 F (37.1 C)   Resp 20   Ht 5\' 9"  (1.753 m)   Wt 215 lb 11 oz (97.8 kg)   SpO2 97%   BMI  31.85 kg/m   Patient's Medications  New Prescriptions   No medications on file  Previous Medications   ACETAMINOPHEN (TYLENOL) 500 MG TABLET    Take 1,000 mg by mouth every 6 (six) hours as needed (for pain/fever/headaches.).    ALLOPURINOL (ZYLOPRIM) 100 MG TABLET    Take 100 mg by mouth daily.   ASCORBIC ACID (VITAMIN C) 1000 MG TABLET    Take 2,000 mg by mouth 2 (two) times daily.   ASPIRIN EC 81 MG EC TABLET    Take 1 tablet (81 mg total) by mouth daily.   CARVEDILOL (COREG) 25 MG TABLET    Take 1 tablet (25 mg total) by mouth 2 (two) times daily.   FERROUS SULFATE 325 (65 FE) MG TABLET    Take 325 mg by mouth 2 (two) times daily with a meal.   FINASTERIDE (PROSCAR) 5 MG TABLET    take 1 tablet by mouth once daily   IPRATROPIUM-ALBUTEROL (DUONEB) 0.5-2.5 (3) MG/3ML SOLN    Take 3 mLs by nebulization every 4 (four) hours as needed.   LOVASTATIN (MEVACOR) 20 MG  TABLET    take 1 tablet by mouth at bedtime   MENTHOL, TOPICAL ANALGESIC, (BIOFREEZE ROLL-ON) 4 % GEL    Apply to bilateral knees topically every 12 hours as needed for pain   METHOCARBAMOL (ROBAXIN) 750 MG TABLET    Take 1 tablet (750 mg total) by mouth 4 (four) times daily as needed (use for muscle cramps/pain).   MIRTAZAPINE (REMERON) 30 MG TABLET    Take 1 tablet (30 mg total) by mouth at bedtime.   MULTIPLE VITAMINS-MINERALS (MULTIVITAMIN WITH MINERALS) TABLET    Take 1 tablet by mouth daily. Men's 50+ Multivitamin   NITROGLYCERIN (NITROSTAT) 0.4 MG SL TABLET    Place 1 tablet (0.4 mg total) under the tongue every 5 (five) minutes as needed for chest pain.   OMEGA-3 1000 MG CAPS    Take 1,000 mg by mouth 2 (two) times daily.   OXYCODONE HCL, ABUSE DETER, (OXAYDO) 5 MG TABA    Give 1-2 tablets by mouth every 4 hours as needed for Moderate to Severe pain.  Hold for sedation or respiratory depression.   PANTOPRAZOLE (PROTONIX) 40 MG TABLET    Take 1 tablet (40 mg total) by mouth daily.   POTASSIUM CHLORIDE SA (K-DUR,KLOR-CON) 20 MEQ TABLET    Take 60 mEq by mouth daily.   TAMSULOSIN (FLOMAX) 0.4 MG CAPS CAPSULE    Take 0.4 mg by mouth daily.    TORSEMIDE (DEMADEX) 20 MG TABLET    Take 40 mg by mouth daily after supper.    TRAZODONE (DESYREL) 100 MG TABLET    Take 300 mg by mouth at bedtime.   Modified Medications   No medications on file  Discontinued Medications   COLCHICINE 0.6 MG TABLET    Take 1 tablet by mouth 2 (two) times daily.   FERROUS SULFATE 325 (65 FE) MG TABLET    take 1 tablet by mouth once daily WITH BREAKFAST   OMEGA-3 ACID ETHYL ESTERS (LOVAZA) 1 G CAPSULE    take 2 capsules by mouth twice a day     SIGNIFICANT DIAGNOSTIC EXAMS  10-06-16: right knee x-ray: Mild right knee osteoarthrosis without acute osseous abnormality.  10-06-16: right foot x-ray: Degenerative changes in the great toe. No acute bony abnormality.  10-06-16: chest x-ray: Cardiomegaly without acute  abnormality.  10-06-16 renal ultrasound: Small bilateral renal cysts with mild parenchymal thinning. No hydronephrosis. Bladder  is distended and patient is unable to void.  10-06-16: chest x-ray: Cardiomegaly without evidence of acute cardiopulmonary disease.     LABS REVIEWED:   09-29-16: wbc 9.9; hgb 10.0; hct 29.9; mcv 85.7; plt 97; glucose 160; bun 20; creat 1.37; k+ 3.9; na++139; mag 1.6 10-02-16: wbc 8.5; hgb 10.3; hct 31.1; mcv 86.4; plt 97; k+ 3.7  10-06-16: glucose 105; bun 61.5; creat 3.87; k+ 5.7; na++ 139; ca 8.1 10-06-16 (ED): wbc 9.7; hgb 9.4; hct 41.9; mcv 84.9; plt 106; glucose 86; bun 69; creat 4.19; k+ 5.4; na++135; ca 8.0; liver normal albumin 2.8; urine culture: VRE 10-09-16: blood culture: no growth  10-10-16: wbc 8.1; hgb 9.0; hct 28.0; mcv 881.1; plt 105; glucose 105; bun 21; creat 1.62; k+ 3.4; na++ 142; ca 8.2  10-14-16: wbc 7.3; hgb 9.9; hct 29.9 ;mcv 85.2; plt 150;g lcuose 164; bun 21.4; creat 1.38; k+ 3.4 ;na++ 143; ca 7.9; uric acid 9.7     Review of Systems  Constitutional: Negative for malaise/fatigue.  Respiratory: Negative for cough and shortness of breath.   Cardiovascular: Negative for chest pain, palpitations and leg swelling.  Gastrointestinal: Positive for abdominal pain. Negative for constipation and heartburn.       New colostomy Has bilateral knee pain   Musculoskeletal: Negative for back pain,myalgias.  Skin: Negative.   Neurological: Negative for dizziness.  Psychiatric/Behavioral: The patient is not nervous/anxious.     Physical Exam  Constitutional: He is oriented to person, place, and time. No distress.  Obese   Eyes: Conjunctivae are normal.  Neck: Neck supple. No JVD present. No thyromegaly present.  Cardiovascular: Normal rate, regular rhythm and intact distal pulses.   Respiratory: Effort normal and breath sounds normal. No respiratory distress. He has no wheezes.  GI: Soft. Bowel sounds are normal. He exhibits no distension. There is no  tenderness.  Has colostomy present with stool in bag   Genitourinary:  Genitourinary Comments: Foley present   Musculoskeletal: He exhibits no edema.  Able to move all extremities Has tenderness to knees.    Lymphadenopathy:    He has no cervical adenopathy.  Neurological: He is alert and oriented to person, place, and time.  Skin: Skin is warm and dry. He is not diaphoretic.  Psychiatric: He has a normal mood and affect.     ASSESSMENT/ PLAN:  1. Acute gout: uric acid 9.7: will begin allopurinol 200 mg daily will start cholchicine 0.6 mg twice daily as needed for gouty pain will check uric acid level; will check bmp in one week. Will monitor his status.  His ability to participate in therapy is linked to how well his gouty pain is being managed.    MD is aware of resident's narcotic use and is in agreement with current plan of care. We will attempt to wean resident as apropriate     Ok Edwards NP [facility] Adult Medicine  Contact [phone] Monday through Friday 8am- 5pm  After hours call [phone]

## 2016-11-02 LAB — BASIC METABOLIC PANEL
BUN: 18 (ref 4–21)
CREATININE: 1.3 (ref 0.6–1.3)
GLUCOSE: 128
Potassium: 3 — AB (ref 3.4–5.3)
Sodium: 144 (ref 137–147)

## 2016-11-04 ENCOUNTER — Encounter: Payer: Self-pay | Admitting: Adult Health

## 2016-11-04 ENCOUNTER — Non-Acute Institutional Stay (SKILLED_NURSING_FACILITY): Payer: Medicare Other | Admitting: Adult Health

## 2016-11-04 ENCOUNTER — Other Ambulatory Visit: Payer: Self-pay | Admitting: *Deleted

## 2016-11-04 DIAGNOSIS — E876 Hypokalemia: Secondary | ICD-10-CM

## 2016-11-04 DIAGNOSIS — M10369 Gout due to renal impairment, unspecified knee: Secondary | ICD-10-CM | POA: Diagnosis not present

## 2016-11-04 NOTE — Patient Outreach (Signed)
[facility]) Care Management  11/04/2016  JAMYRON REDD 1950/09/01 [id number]   CSW was able to meet with patient today at [facility] [place] where patient currently resides to receive short-term rehabilitative services.  Patient appeared to be quite frustrated today, preoccupied with thoughts of being discharged from the facility, even though he does not feel he is ready for discharge.  Patient was told that he no longer meets criteria for Medicare to cover skilled care, as patient has completed therapies (both physical and occupational). Patient was also told that he is currently residing at [facility] under "custodial care", which is not billable by Medicare.  CSW agreed to contact a representative with the business office to find out why they are unable to move patient into a Long-Term Care Medicaid bed, as patient currently has active coverage.  CSW will then follow-up with patient to report findings. Counseling and supportive services were provided.   [name], BSW, MSW, LCSW  Licensed Education officer, environmental Health System  Mailing [place] [address], [place], [place] [address] Physical Address-[address], [place],  [address] Toll Free Main # [phone] Fax # [phone] Cell # [phone]  Office # [phone] [email or web address]

## 2016-11-04 NOTE — Progress Notes (Signed)
Location:   [facility] Room Number: 118 A Place of Service:  SNF (31)   CODE STATUS: Full Code  Allergies  Allergen Reactions  . Nsaids     Kidney disease    Chief Complaint  Patient presents with  . Acute Visit    Gout    HPI:  He is complaining of bilateral knee pain due to gout. He has severe pain to mild palpation. He says that the is not managed; the staff is concerned that his medication will need to be adjusted. He is unable to participate in therapy due to his pain. When his pain is under control he is able to do well in therapy. At this time; he is not ready to be discharged due to his uncontrolled gout. Once his gout is under control and he is ready physically discharge to home is a reasonable goal.   Past Medical History:  Diagnosis Date  . Arthritis    "right hand; right knee" (06/19/2014)  . CAD (coronary artery disease)    2v CAD with subtotal occlusion of small co-dominant RCA and borderline lesion in moderate-sized OM-1  . CHF (congestive heart failure) (HCC)    Preserved EF  . Coronary artery disease involving native coronary artery of native heart with angina pectoris ([place]) 09/11/2016  . Degenerative joint disease of knee, right   . Dysrhythmia   . Enlarged prostate   . Hyperlipidemia   . Hypertension   . Pneumonia 05/2014  . Prediabetes 10/03/2014  . Scrotal edema 04/03/2015  . SMALL BOWEL OBSTRUCTION, HX OF 08/21/2007   Annotation: with narrowing in the ileocecal region Qualifier: Diagnosis of  By: Hassell Done FNP, Tori Milks      Past Surgical History:  Procedure Laterality Date  . CARDIAC CATHETERIZATION N/A 02/16/2016   Procedure: Right/Left Heart Cath and Coronary Angiography;  Surgeon: Jolaine Artist, MD;  Location: [facility] LAB;  Service: Cardiovascular;  Laterality: N/A;  . COLONOSCOPY N/A 04/01/2016   Procedure: COLONOSCOPY;  Surgeon: Leighton Ruff, MD;  [facility];  Service: Endoscopy;  Laterality: N/A;  . INGUINAL  HERNIA REPAIR Left 1990's  . IR GENERIC HISTORICAL  07/05/2016   IR RADIOLOGIST EVAL & MGMT 07/05/2016 Corrie Mckusick, DO [facility] INTERV RAD  . XI ROBOT ABDOMINAL PERINEAL RESECTION N/A 09/28/2016   Procedure: XI ROBOT ABDOMINOPERINEAL RESECTION WITH PERMANENT COLOSTOMY ERAS PATHWAY;  Surgeon: Michael Boston, MD;  [facility];  Service: General;  Laterality: N/A;    Social History   Social History  . Marital status: Single    Spouse name: N/A  . Number of children: N/A  . Years of education: N/A   Occupational History  . Not on file.   Social History Main Topics  . Smoking status: Former Smoker    Years: 5.00    Types: Pipe, Landscape architect  . Smokeless tobacco: Never Used     Comment: 06/19/2014 "stopped smoking in ~ 2014; used to smoke a pipe or cigar a couple times/month"  . Alcohol use No  . Drug use: No  . Sexual activity: No   Other Topics Concern  . Not on file   Social History Narrative   Single, lives w/his sister of whom he is caregiver      Family History  Problem Relation Age of Onset  . Hypertension Mother   . Diabetes Mother   . Stroke Mother   . Cancer Father 73       Prostate  . Dementia Father   .  COPD Sister   . Arthritis Sister   . Edema Sister       VITAL SIGNS BP 128/76   Pulse 84   Temp 98.6 F (37 C)   Resp 18   Ht '5\' 9"'  (1.753 m)   Wt 215 lb 11 oz (97.8 kg)   SpO2 97%   BMI 31.85 kg/m   Patient's Medications  New Prescriptions   No medications on file  Previous Medications   ACETAMINOPHEN (TYLENOL) 500 MG TABLET    Take 1,000 mg by mouth every 6 (six) hours as needed (for pain/fever/headaches.).    ALLOPURINOL (ZYLOPRIM) 100 MG TABLET    Take 200 mg by mouth daily.   ASCORBIC ACID (VITAMIN C) 1000 MG TABLET    Take 2,000 mg by mouth 2 (two) times daily.   ASPIRIN EC 81 MG EC TABLET    Take 1 tablet (81 mg total) by mouth daily.   CARVEDILOL (COREG) 25 MG TABLET    Take 1 tablet (25 mg total) by mouth 2 (two) times daily.   COLCHICINE 0.6  MG TABLET    Take 0.6 mg by mouth 2 (two) times daily as needed.   FERROUS SULFATE 325 (65 FE) MG TABLET    Take 325 mg by mouth 2 (two) times daily with a meal.   FINASTERIDE (PROSCAR) 5 MG TABLET    take 1 tablet by mouth once daily   IPRATROPIUM-ALBUTEROL (DUONEB) 0.5-2.5 (3) MG/3ML SOLN    Take 3 mLs by nebulization every 4 (four) hours as needed.   LOVASTATIN (MEVACOR) 20 MG TABLET    take 1 tablet by mouth at bedtime   MENTHOL, TOPICAL ANALGESIC, (BIOFREEZE ROLL-ON) 4 % GEL    Apply to bilateral knees topically every 12 hours as needed for pain   METHOCARBAMOL (ROBAXIN) 750 MG TABLET    Take 1 tablet (750 mg total) by mouth 4 (four) times daily as needed (use for muscle cramps/pain).   MIRTAZAPINE (REMERON) 30 MG TABLET    Take 1 tablet (30 mg total) by mouth at bedtime.   MULTIPLE VITAMINS-MINERALS (MULTIVITAMIN WITH MINERALS) TABLET    Take 1 tablet by mouth daily. Men's 50+ Multivitamin   NITROGLYCERIN (NITROSTAT) 0.4 MG SL TABLET    Place 1 tablet (0.4 mg total) under the tongue every 5 (five) minutes as needed for chest pain.   OMEGA-3 1000 MG CAPS    Take 1,000 mg by mouth 2 (two) times daily.   OXYCODONE HCL, ABUSE DETER, (OXAYDO) 5 MG TABA    Give 1-2 tablets by mouth every 4 hours as needed for Moderate to Severe pain.  Hold for sedation or respiratory depression.   PANTOPRAZOLE (PROTONIX) 40 MG TABLET    Take 1 tablet (40 mg total) by mouth daily.   POTASSIUM CHLORIDE SA (K-DUR,KLOR-CON) 20 MEQ TABLET    Take 60 mEq by mouth daily.   TAMSULOSIN (FLOMAX) 0.4 MG CAPS CAPSULE    Take 0.4 mg by mouth daily.    TORSEMIDE (DEMADEX) 20 MG TABLET    Take 40 mg by mouth daily after supper.    TRAZODONE (DESYREL) 100 MG TABLET    Take 300 mg by mouth at bedtime.   Modified Medications   No medications on file  Discontinued Medications   No medications on file     SIGNIFICANT DIAGNOSTIC EXAMS   10-06-16: right knee x-ray: Mild right knee osteoarthrosis without acute osseous  abnormality.  10-06-16: right foot x-ray: Degenerative changes in the great toe.  No acute bony abnormality.  10-06-16: chest x-ray: Cardiomegaly without acute abnormality.  10-06-16 renal ultrasound: Small bilateral renal cysts with mild parenchymal thinning. No hydronephrosis. Bladder is distended and patient is unable to void.  10-06-16: chest x-ray: Cardiomegaly without evidence of acute cardiopulmonary disease.     LABS REVIEWED:   09-29-16: wbc 9.9; hgb 10.0; hct 29.9; mcv 85.7; plt 97; glucose 160; bun 20; creat 1.37; k+ 3.9; na++139; mag 1.6 10-02-16: wbc 8.5; hgb 10.3; hct 31.1; mcv 86.4; plt 97; k+ 3.7  10-06-16: glucose 105; bun 61.5; creat 3.87; k+ 5.7; na++ 139; ca 8.1 10-06-16 (ED): wbc 9.7; hgb 9.4; hct 41.9; mcv 84.9; plt 106; glucose 86; bun 69; creat 4.19; k+ 5.4; na++135; ca 8.0; liver normal albumin 2.8; urine culture: VRE 10-09-16: blood culture: no growth  10-10-16: wbc 8.1; hgb 9.0; hct 28.0; mcv 881.1; plt 105; glucose 105; bun 21; creat 1.62; k+ 3.4; na++ 142; ca 8.2  10-14-16: wbc 7.3; hgb 9.9; hct 29.9 ;mcv 85.2; plt 150;g lcuose 164; bun 21.4; creat 1.38; k+ 3.4 ;na++ 143; ca 7.9; uric acid 9.7  10-19-16: wbc 5.5; hgb 9.5; hct 30.5; mcv 85.9; plt 185; glucose 190; bun 19.4; creat 1.7; k+ 3.8; na++ 143 ca 8.9; ast 65; alt 92; alk phos 232;albumin 2.4  iron 29; tibc 191; ferritin 230; CEA 1.50  11-02-16: glucose 128; bun 17.8; creat 1.34; k+ 3.0; na++ 144; ca 8.5; uric acid 9.3    Review of Systems  Constitutional: Negative for malaise/fatigue.  Respiratory: Negative for cough and shortness of breath.   Cardiovascular: Negative for chest pain, palpitations and leg swelling.  Gastrointestinal: negative for abdominal pain . Negative for constipation and heartburn.       New colostomy Has bilateral knee pain not adequately controlled.    Musculoskeletal: Negative for back pain,myalgias.  Skin: Negative.   Neurological: Negative for dizziness.  Psychiatric/Behavioral: The  patient is not nervous/anxious.     Physical Exam  Constitutional: He is oriented to person, place, and time. No distress.  Obese   Eyes: Conjunctivae are normal.  Neck: Neck supple. No JVD present. No thyromegaly present.  Cardiovascular: Normal rate, regular rhythm and intact distal pulses.   Respiratory: Effort normal and breath sounds normal. No respiratory distress. He has no wheezes.  GI: Soft. Bowel sounds are normal. He exhibits no distension. There is no tenderness.  Has colostomy present with stool in bag   Genitourinary:  Genitourinary Comments: Foley present   Musculoskeletal: He exhibits no edema.  Able to move all extremities Has tenderness to knees has mild swelling present.    Lymphadenopathy:    He has no cervical adenopathy.  Neurological: He is alert and oriented to person, place, and time.  Skin: Skin is warm and dry. He is not diaphoretic.  Psychiatric: He has a normal mood and affect.     ASSESSMENT/ PLAN:  1. Acute gout: uric acid 9.3: he has not had any significant improvement in his gouty symptoms. Will increase his allopurinol to 300 mg daily will change his cholchine to 0.6 twice daily. Once his gouty symptoms are adequately managed; I expect that he will be able to fully participate in therapy and will be able to return home at that time.   2. Hypokalemia: k+ 3.0; will change k+ to 60 meq in the AM and 20 meq in the PM will monitor   MD is aware of resident's narcotic use and is in agreement with current plan of care. We will  attempt to wean resident as apropriate   Ok Edwards NP [facility] Adult Medicine  Contact [phone] Monday through Friday 8am- 5pm  After hours call [phone]

## 2016-11-08 DIAGNOSIS — M109 Gout, unspecified: Secondary | ICD-10-CM | POA: Insufficient documentation

## 2016-11-08 DIAGNOSIS — M6281 Muscle weakness (generalized): Secondary | ICD-10-CM | POA: Diagnosis not present

## 2016-11-08 NOTE — Progress Notes (Signed)
HPI The patient presents for follow-up CAD.  On cardiac cath in Sept 2017 he had subtotal occlusion of the small codominant right coronary. He had a borderline lesion in a moderate size first marginal. He had a severe cardiomyopathy with an ejection fraction of 20%. He did have mild to moderate pulmonary venous hypertension and  normal cardiac output.  His echo in Dec demonstrated his EF to be somewhat improved at 30 - 35%.  He was also found to be in atrial fib.  He is being managed for rectal cancer.  He was seen in Feb for preop evaluation.   However, his surgery was been delayed.   Since I last saw him he was hospitalized twice.  I reviewed these records for this visit.   In early May resection of his cancer with permanent colostomy.  He was in the hospital again a few weeks later with AKI.  Metolzaone, losartan and torsemide were held.  He had mild troponin elevation.  At discharge the torsemide was restarted.   He is at rehabilitation. He does some walking with a walker. He's not having any new shortness of breath, PND or orthopnea with this. He thinks his heart has been doing fine. He's not noticing any palpitations, presyncope or syncope. He's had no weight gain or edema.   He has had severe gout.     Allergies  Allergen Reactions  . Nsaids     Kidney disease    Current Outpatient Prescriptions  Medication Sig Dispense Refill  . acetaminophen (TYLENOL) 500 MG tablet Take 1,000 mg by mouth every 6 (six) hours as needed (for pain/fever/headaches.).     Marland Kitchen allopurinol (ZYLOPRIM) 300 MG tablet Take 300 mg by mouth daily.     . Ascorbic Acid (VITAMIN C) 1000 MG tablet Take 2,000 mg by mouth 2 (two) times daily.    Marland Kitchen aspirin EC 81 MG EC tablet Take 1 tablet (81 mg total) by mouth daily. 30 tablet 0  . carvedilol (COREG) 25 MG tablet Take 1 tablet (25 mg total) by mouth 2 (two) times daily. 180 tablet 3  . colchicine 0.6 MG tablet Take 0.6 mg by mouth 2 (two) times daily as needed.    .  ferrous sulfate 325 (65 FE) MG tablet Take 325 mg by mouth 2 (two) times daily with a meal.    . finasteride (PROSCAR) 5 MG tablet take 1 tablet by mouth once daily 90 tablet 1  . ipratropium-albuterol (DUONEB) 0.5-2.5 (3) MG/3ML SOLN Take 3 mLs by nebulization every 4 (four) hours as needed. 360 mL 0  . lovastatin (MEVACOR) 20 MG tablet take 1 tablet by mouth at bedtime 90 tablet 1  . Menthol, Topical Analgesic, (BIOFREEZE ROLL-ON) 4 % GEL Apply to bilateral knees topically every 12 hours as needed for pain    . methocarbamol (ROBAXIN) 750 MG tablet Take 1 tablet (750 mg total) by mouth 4 (four) times daily as needed (use for muscle cramps/pain). 30 tablet 2  . methylPREDNISolone (MEDROL DOSEPAK) 4 MG TBPK tablet Day 1 - give 6 tablets by mouth once daily Day 2 - Give 5 tablets by mouth once daily Day 3 - Give 4 tablets by mouth once daily Day 4 - Give 3 tablets by mouth once daily Day 5 - Give 2 tablets by mouth once daily Day 6 - Give 1 tablet by mouth once daily    . mirtazapine (REMERON) 30 MG tablet Take 1 tablet (30 mg total) by mouth at  bedtime. 30 tablet 1  . Multiple Vitamins-Minerals (MULTIVITAMIN WITH MINERALS) tablet Take 1 tablet by mouth daily. Men's 50+ Multivitamin    . nitroGLYCERIN (NITROSTAT) 0.4 MG SL tablet Place 1 tablet (0.4 mg total) under the tongue every 5 (five) minutes as needed for chest pain. 25 tablet 3  . Omega-3 1000 MG CAPS Take 2,000 mg by mouth 2 (two) times daily.     . OxyCODONE HCl, Abuse Deter, (OXAYDO) 5 MG TABA Give 1-2 tablets by mouth every 4 hours as needed for Moderate to Severe pain.  Hold for sedation or respiratory depression.    . pantoprazole (PROTONIX) 40 MG tablet Take 1 tablet (40 mg total) by mouth daily. 30 tablet 0  . potassium chloride SA (K-DUR,KLOR-CON) 20 MEQ tablet Take 60 mEq by mouth every morning. And 20 meq by mouth in the evening    . tamsulosin (FLOMAX) 0.4 MG CAPS capsule Take 0.4 mg by mouth daily.     Marland Kitchen torsemide (DEMADEX) 20  MG tablet Take 40 mg by mouth daily after supper.     . traZODone (DESYREL) 100 MG tablet Take 300 mg by mouth at bedtime.   0   No current facility-administered medications for this visit.     Past Medical History:  Diagnosis Date  . Arthritis    "right hand; right knee" (06/19/2014)  . CAD (coronary artery disease)    2v CAD with subtotal occlusion of small co-dominant RCA and borderline lesion in moderate-sized OM-1  . CHF (congestive heart failure) (HCC)    Preserved EF  . Coronary artery disease involving native coronary artery of native heart with angina pectoris ([place]) 09/11/2016  . Degenerative joint disease of knee, right   . Dysrhythmia   . Enlarged prostate   . Hyperlipidemia   . Hypertension   . Pneumonia 05/2014  . Prediabetes 10/03/2014  . Scrotal edema 04/03/2015  . SMALL BOWEL OBSTRUCTION, HX OF 08/21/2007   Annotation: with narrowing in the ileocecal region Qualifier: Diagnosis of  By: Hassell Done FNP, Tori Milks      Past Surgical History:  Procedure Laterality Date  . CARDIAC CATHETERIZATION N/A 02/16/2016   Procedure: Right/Left Heart Cath and Coronary Angiography;  Surgeon: Jolaine Artist, MD;  Location: [facility];  Service: Cardiovascular;  Laterality: N/A;  . COLONOSCOPY N/A 04/01/2016   Procedure: COLONOSCOPY;  Surgeon: Leighton Ruff, MD;  Location: [facility];  Service: Endoscopy;  Laterality: N/A;  . INGUINAL HERNIA REPAIR Left 1990's  . IR GENERIC HISTORICAL  07/05/2016   IR RADIOLOGIST EVAL & MGMT 07/05/2016 Corrie Mckusick, DO [facility] INTER[facility] RAD  . XI ROBOT ABDOMINAL PERINEAL RESECTION N/A 09/28/2016   Procedure: XI ROBOT ABDOMINOPERINEAL RESECTION WITH PERMANENT COLOSTOMY ERAS PATHWAY;  Surgeon: Michael Boston, MD;  Location: [facility];  Service: General;  Laterality: N/A;    ROS:     As stated in the HPI and negative for all other systems.  PHYSICAL EXAM BP 114/62   Pulse 74   Ht 5\' 9"  (1.753 m)   Wt 187 lb (84.8 kg)   BMI 27.62 kg/m   GEN:  No distress,  frail NECK:  No jugular venous distention at 90 degrees, waveform within normal limits, carotid upstroke brisk and symmetric, no bruits, no thyromegaly LYMPHATICS:  No cervical adenopathy LUNGS:  Clear to auscultation bilaterally BACK:  No CVA tenderness CHEST:  Unremarkable HEART:  S1 and S2 within normal limits, no S3, no S4, no clicks, no rubs, no murmurs ABD:  Positive bowel  sounds normal in frequency in pitch, no bruits, no rebound, no guarding, unable to assess midline mass or bruit with the patient seated., colostomy EXT:  2 plus pulses throughout, no edema, no cyanosis no clubbing SKIN:  No rashes no nodules NEURO:  Cranial nerves II through XII grossly intact, motor grossly intact throughout PSYCH:  Cognitively intact, oriented to person place and time    GENERAL:  Well appearing and in no distress HEENT:  Pupils equal round and reactive, fundi not visualized, oral mucosa unremarkable, poor dentition.  NECK:  Positive jugular venous distention to the jaw at 45 degrees, waveform within normal limits, carotid upstroke brisk and symmetric, no bruits, no thyromegaly LUNGS:  Clear to auscultation bilaterally BACK:  No CVA tenderness CHEST:  Unremarkable HEART:  PMI not displaced or sustained,S1 within normal limits, fixed split S2, no S3,  no clicks, no rubs, no murmurs, irregular ABD:  Flat, positive bowel sounds normal in frequency in pitch, no bruits, no rebound, no guarding, no midline pulsatile mass, no hepatomegaly, no splenomegaly EXT:  2 plus pulses throughout, moderate edema bilateral legs, no cyanosis no clubbing   EKG:  Atrial fibrillation, rate 77, right bundle branch block, left anterior fascicular block, premature ectopic complexes.  11/10/2016  Lab Results  Component Value Date   CREATININE 1.3 11/02/2016     ASSESSMENT AND PLAN  ATRIAL FIB:   Mr. AGAPITO HANWAY has a CHA2DS2 - VASc score of 5.    He has been very anemic. I'm going to hold off on starting  anticoagulation until I have conferred with his surgeon and oncologist.  I sent them a message.    CAD:   The patient has no new sypmtoms.  No further cardiovascular testing is indicated.  We will continue with aggressive risk reduction and meds as listed.  Mild renal insufficiency:    The creat in last week was 1.3 as above.  We will follow this closely.   CM:  Probably predominantly nonischemic. He will not tolerate med titration particularly given his recent problems. He seems to be euvolemic. No change in therapy is planned.   HTN:    This is being managed in the context of treating his CHF.    PULM HTN:  This is likely secondary.  He has no symptoms of right-sided volume overload or other issues. I will manage this conservatively.   Extensive hospital records reviewed for this visit.

## 2016-11-09 ENCOUNTER — Encounter: Payer: Medicare Other | Admitting: Hematology

## 2016-11-09 ENCOUNTER — Other Ambulatory Visit: Payer: Medicare Other

## 2016-11-09 DIAGNOSIS — N183 Chronic kidney disease, stage 3 (moderate): Secondary | ICD-10-CM | POA: Diagnosis not present

## 2016-11-09 DIAGNOSIS — I428 Other cardiomyopathies: Secondary | ICD-10-CM | POA: Diagnosis not present

## 2016-11-09 DIAGNOSIS — N390 Urinary tract infection, site not specified: Secondary | ICD-10-CM | POA: Diagnosis not present

## 2016-11-09 DIAGNOSIS — I481 Persistent atrial fibrillation: Secondary | ICD-10-CM | POA: Diagnosis not present

## 2016-11-09 DIAGNOSIS — R339 Retention of urine, unspecified: Secondary | ICD-10-CM | POA: Diagnosis not present

## 2016-11-09 DIAGNOSIS — I272 Pulmonary hypertension, unspecified: Secondary | ICD-10-CM | POA: Diagnosis not present

## 2016-11-09 DIAGNOSIS — C20 Malignant neoplasm of rectum: Secondary | ICD-10-CM | POA: Diagnosis not present

## 2016-11-09 DIAGNOSIS — Z9049 Acquired absence of other specified parts of digestive tract: Secondary | ICD-10-CM | POA: Diagnosis not present

## 2016-11-09 DIAGNOSIS — I2510 Atherosclerotic heart disease of native coronary artery without angina pectoris: Secondary | ICD-10-CM | POA: Diagnosis not present

## 2016-11-09 DIAGNOSIS — R5381 Other malaise: Secondary | ICD-10-CM | POA: Diagnosis not present

## 2016-11-09 DIAGNOSIS — M6281 Muscle weakness (generalized): Secondary | ICD-10-CM | POA: Diagnosis not present

## 2016-11-09 DIAGNOSIS — M1A09X0 Idiopathic chronic gout, multiple sites, without tophus (tophi): Secondary | ICD-10-CM | POA: Diagnosis not present

## 2016-11-09 DIAGNOSIS — M1A29X0 Drug-induced chronic gout, multiple sites, without tophus (tophi): Secondary | ICD-10-CM | POA: Diagnosis not present

## 2016-11-09 DIAGNOSIS — D500 Iron deficiency anemia secondary to blood loss (chronic): Secondary | ICD-10-CM | POA: Diagnosis not present

## 2016-11-09 DIAGNOSIS — I255 Ischemic cardiomyopathy: Secondary | ICD-10-CM | POA: Diagnosis not present

## 2016-11-09 DIAGNOSIS — E785 Hyperlipidemia, unspecified: Secondary | ICD-10-CM | POA: Diagnosis not present

## 2016-11-09 DIAGNOSIS — N400 Enlarged prostate without lower urinary tract symptoms: Secondary | ICD-10-CM | POA: Diagnosis not present

## 2016-11-09 DIAGNOSIS — I5022 Chronic systolic (congestive) heart failure: Secondary | ICD-10-CM | POA: Diagnosis not present

## 2016-11-09 DIAGNOSIS — I10 Essential (primary) hypertension: Secondary | ICD-10-CM | POA: Diagnosis not present

## 2016-11-09 LAB — CBC AND DIFFERENTIAL
HEMATOCRIT: 43 (ref 41–53)
HEMOGLOBIN: 15.4 (ref 13.5–17.5)
PLATELETS: 264 (ref 150–399)
WBC: 10

## 2016-11-09 LAB — BASIC METABOLIC PANEL
BUN: 18 (ref 4–21)
Creatinine: 1 (ref 0.6–1.3)
Glucose: 103
POTASSIUM: 3.4 (ref 3.4–5.3)
Sodium: 144 (ref 137–147)

## 2016-11-10 ENCOUNTER — Encounter: Payer: Self-pay | Admitting: Cardiology

## 2016-11-10 ENCOUNTER — Ambulatory Visit (INDEPENDENT_AMBULATORY_CARE_PROVIDER_SITE_OTHER): Payer: Medicare Other | Admitting: Cardiology

## 2016-11-10 ENCOUNTER — Encounter: Payer: Self-pay | Admitting: Internal Medicine

## 2016-11-10 ENCOUNTER — Non-Acute Institutional Stay (SKILLED_NURSING_FACILITY): Payer: Medicare Other | Admitting: Internal Medicine

## 2016-11-10 VITALS — BP 114/62 | HR 74 | Ht 69.0 in | Wt 187.0 lb

## 2016-11-10 DIAGNOSIS — N183 Chronic kidney disease, stage 3 unspecified: Secondary | ICD-10-CM

## 2016-11-10 DIAGNOSIS — R339 Retention of urine, unspecified: Secondary | ICD-10-CM | POA: Diagnosis not present

## 2016-11-10 DIAGNOSIS — I2510 Atherosclerotic heart disease of native coronary artery without angina pectoris: Secondary | ICD-10-CM

## 2016-11-10 DIAGNOSIS — I5022 Chronic systolic (congestive) heart failure: Secondary | ICD-10-CM

## 2016-11-10 DIAGNOSIS — D696 Thrombocytopenia, unspecified: Secondary | ICD-10-CM

## 2016-11-10 DIAGNOSIS — I4819 Other persistent atrial fibrillation: Secondary | ICD-10-CM

## 2016-11-10 DIAGNOSIS — I10 Essential (primary) hypertension: Secondary | ICD-10-CM

## 2016-11-10 DIAGNOSIS — R5381 Other malaise: Secondary | ICD-10-CM | POA: Diagnosis not present

## 2016-11-10 DIAGNOSIS — I272 Pulmonary hypertension, unspecified: Secondary | ICD-10-CM | POA: Diagnosis not present

## 2016-11-10 DIAGNOSIS — D500 Iron deficiency anemia secondary to blood loss (chronic): Secondary | ICD-10-CM

## 2016-11-10 DIAGNOSIS — C20 Malignant neoplasm of rectum: Secondary | ICD-10-CM

## 2016-11-10 DIAGNOSIS — I481 Persistent atrial fibrillation: Secondary | ICD-10-CM | POA: Diagnosis not present

## 2016-11-10 DIAGNOSIS — M1A09X0 Idiopathic chronic gout, multiple sites, without tophus (tophi): Secondary | ICD-10-CM | POA: Diagnosis not present

## 2016-11-10 DIAGNOSIS — M6281 Muscle weakness (generalized): Secondary | ICD-10-CM | POA: Diagnosis not present

## 2016-11-10 NOTE — Progress Notes (Signed)
DATE:  November 10, 2016  Location:   [facility] Room Number: 118 A Place of Service: SNF (31)   Extended Emergency Contact Information Primary Emergency Contact: Bosler,[name] Address: [place], [place] Mobile Phone: [phone] Relation: Sister  Advanced Directive information Does Patient Have a Medical Advance Directive?: Yes, Type of Advance Directive: Out of facility DNR (pink MOST or yellow form), Pre-existing out of facility DNR order (yellow form or pink MOST form): Pink MOST form placed in chart (order not valid for inpatient use), Does patient want to make changes to medical advance directive?: No - Patient declined  Chief Complaint  Patient presents with  . Discharge Note    Discharging to Home    HPI:  66 yo male seen today for d/c from SNF following short term rehab. He will d/c home with [facility] PT/OT (gait training and balance transfers), CNA (assist with ADLs) and RN (wound, med mx). He will require 3-in-1 commode and rolling walker. He has no concerns today.  BRIEF SUMMARY OF HOSPITAL/SNF STAY: he presented "to SNF following hospital stay for adenoCA of rectum, AKI, HTN, chronic sHF (EF 30-35%), thrombocytopenia, hyperkalemia, MRSA carrier, VRE bacteremia, CAD, right knee pain, AOCD. 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 He rec'd 1 units PRBCs. He returned to the hospital 4 days after d/c with AKI, acute UTI, VRE bacteremia. meds adjusted. He had tx with IV rocephin for UTI. Urine cx (+) VRE -->po amoxil. Renal US neg hydronephrosis. Right knee xray neg for acute process. RLE doppler neg for DVT. Cr 4.14->1.62; plts dropped to 79K-->105K; K 5.7-->3.4; A1c 5.7%; albumin 2.8; H/H 9/28" at hospital d/c.  While at SNF, he had gout flares and uncontrolled pain. meds adjusted    Past Medical History:  Diagnosis Date  . Arthritis    "right hand; right knee" (06/19/2014)    . CAD (coronary artery disease)    2v CAD with subtotal occlusion of small co-dominant RCA and borderline lesion in moderate-sized OM-1  . CHF (congestive heart failure) (HCC)    Preserved EF  . Coronary artery disease involving native coronary artery of native heart with angina pectoris ([place]) 09/11/2016  . Degenerative joint disease of knee, right   . Dysrhythmia   . Enlarged prostate   . Hyperlipidemia   . Hypertension   . Pneumonia 05/2014  . Prediabetes 10/03/2014  . Scrotal edema 04/03/2015  . SMALL BOWEL OBSTRUCTION, HX OF 08/21/2007   Annotation: with narrowing in the ileocecal region Qualifier: Diagnosis of  By: Hassell Done FNP, Tori Milks      Past Surgical History:  Procedure Laterality Date  . CARDIAC CATHETERIZATION N/A 02/16/2016   Procedure: Right/Left Heart Cath and Coronary Angiography;  Surgeon: Jolaine Artist, MD;  Location: [facility];  Service: Cardiovascular;  Laterality: N/A;  . COLONOSCOPY N/A 04/01/2016   Procedure: COLONOSCOPY;  Surgeon: Leighton Ruff, MD;  Location: [facility];  Service: Endoscopy;  Laterality: N/A;  . INGUINAL HERNIA REPAIR Left 1990's  . IR GENERIC HISTORICAL  07/05/2016   IR RADIOLOGIST EVAL & MGMT 07/05/2016 Corrie Mckusick, DO [facility] INTERV RAD  . XI ROBOT ABDOMINAL PERINEAL RESECTION N/A 09/28/2016   Procedure: XI ROBOT ABDOMINOPERINEAL RESECTION WITH PERMANENT COLOSTOMY ERAS PATHWAY;  Surgeon: Michael Boston, MD;  Location: [facility] ORS;  Service: General;  Laterality: N/A;    Patient Care Team: Sela Hua,  MD as PCP - General (Family Medicine) Kyung Rudd, MD as Consulting Physician (Radiation Oncology) Truitt Merle, MD as Consulting Physician (Hematology) Minus Breeding, MD as Consulting Physician (Cardiology) Michael Boston, MD as Consulting Physician (General Surgery) Ardis Hughs, MD as Attending Physician (Urology) Saporito, Maree Erie, LCSW as [place], [place], DO as Consulting Physician  (Internal Medicine)  Social History   Social History  . Marital status: Single    Spouse name: N/A  . Number of children: N/A  . Years of education: N/A   Occupational History  . Not on file.   Social History Main Topics  . Smoking status: Former Smoker    Years: 5.00    Types: Pipe, Landscape architect  . Smokeless tobacco: Never Used     Comment: 06/19/2014 "stopped smoking in ~ 2014; used to smoke a pipe or cigar a couple times/month"  . Alcohol use No  . Drug use: No  . Sexual activity: No   Other Topics Concern  . Not on file   Social History Narrative   Single, lives w/his sister of whom he is caregiver        reports that he has quit smoking. His smoking use included Pipe and Cigars. He quit after 5.00 years of use. He has never used smokeless tobacco. He reports that he does not drink alcohol or use drugs.  Family History  Problem Relation Age of Onset  . Hypertension Mother   . Diabetes Mother   . Stroke Mother   . Cancer Father 65       Prostate  . Dementia Father   . COPD Sister   . Arthritis Sister   . Edema Sister    Family Status  Relation Status  . Mother Deceased  . Father Deceased  . Sister Alive    Immunization History  Administered Date(s) Administered  . Influenza Split 06/22/2012  . Influenza Whole 03/22/2006, 03/28/2007, 03/27/2008, 03/02/2009, 03/09/2010  . Influenza, High Dose Seasonal PF 01/14/2016  . Influenza,inj,Quad PF,36+ Mos 02/07/2013, 02/06/2015  . Influenza-Unspecified 02/27/2014  . PPD Test 10/03/2016  . Pneumococcal Conjugate-13 03/11/2014  . Pneumococcal Polysaccharide-23 12/15/2004, 01/25/2016  . Td 12/15/2004    Allergies  Allergen Reactions  . Nsaids     Kidney disease    Medications: Patient's Medications  New Prescriptions   No medications on file  Previous Medications   ACETAMINOPHEN (TYLENOL) 500 MG TABLET    Take 1,000 mg by mouth every 6 (six) hours as needed (for pain/fever/headaches.).    ALLOPURINOL  (ZYLOPRIM) 300 MG TABLET    Take 300 mg by mouth daily.    ASCORBIC ACID (VITAMIN C) 1000 MG TABLET    Take 2,000 mg by mouth 2 (two) times daily.   ASPIRIN EC 81 MG EC TABLET    Take 1 tablet (81 mg total) by mouth daily.   CARVEDILOL (COREG) 25 MG TABLET    Take 1 tablet (25 mg total) by mouth 2 (two) times daily.   COLCHICINE 0.6 MG TABLET    Take 0.6 mg by mouth 2 (two) times daily as needed.   FERROUS SULFATE 325 (65 FE) MG TABLET    Take 325 mg by mouth 2 (two) times daily with a meal.   FINASTERIDE (PROSCAR) 5 MG TABLET    take 1 tablet by mouth once daily   IPRATROPIUM-ALBUTEROL (DUONEB) 0.5-2.5 (3) MG/3ML SOLN    Take 3 mLs by nebulization every 4 (four) hours as needed.   LOVASTATIN (  MEVACOR) 20 MG TABLET    take 1 tablet by mouth at bedtime   MENTHOL, TOPICAL ANALGESIC, (BIOFREEZE ROLL-ON) 4 % GEL    Apply to bilateral knees topically every 12 hours as needed for pain   METHOCARBAMOL (ROBAXIN) 750 MG TABLET    Take 1 tablet (750 mg total) by mouth 4 (four) times daily as needed (use for muscle cramps/pain).   METHYLPREDNISOLONE (MEDROL DOSEPAK) 4 MG TBPK TABLET    Day 1 - give 6 tablets by mouth once daily Day 2 - Give 5 tablets by mouth once daily Day 3 - Give 4 tablets by mouth once daily Day 4 - Give 3 tablets by mouth once daily Day 5 - Give 2 tablets by mouth once daily Day 6 - Give 1 tablet by mouth once daily   MIRTAZAPINE (REMERON) 30 MG TABLET    Take 1 tablet (30 mg total) by mouth at bedtime.   MULTIPLE VITAMINS-MINERALS (MULTIVITAMIN WITH MINERALS) TABLET    Take 1 tablet by mouth daily. Men's 50+ Multivitamin   NITROGLYCERIN (NITROSTAT) 0.4 MG SL TABLET    Place 1 tablet (0.4 mg total) under the tongue every 5 (five) minutes as needed for chest pain.   OMEGA-3 1000 MG CAPS    Take 2,000 mg by mouth 2 (two) times daily.    OXYCODONE HCL, ABUSE DETER, (OXAYDO) 5 MG TABA    Give 1-2 tablets by mouth every 4 hours as needed for Moderate to Severe pain.  Hold for sedation or  respiratory depression.   PANTOPRAZOLE (PROTONIX) 40 MG TABLET    Take 1 tablet (40 mg total) by mouth daily.   POTASSIUM CHLORIDE SA (K-DUR,KLOR-CON) 20 MEQ TABLET    Take 60 mEq by mouth every morning. And 20 meq by mouth in the evening   TAMSULOSIN (FLOMAX) 0.4 MG CAPS CAPSULE    Take 0.4 mg by mouth daily.    TORSEMIDE (DEMADEX) 20 MG TABLET    Take 40 mg by mouth daily after supper.    TRAZODONE (DESYREL) 100 MG TABLET    Take 300 mg by mouth at bedtime.   Modified Medications   No medications on file  Discontinued Medications   No medications on file    Review of Systems  Musculoskeletal: Positive for arthralgias and joint swelling.  All other systems reviewed and are negative.   Vitals:   11/10/16 1431  BP: 128/68  Pulse: 70  Resp: 18  Temp: 97.8 F (36.6 C)  SpO2: 98%  Weight: 215 lb 11 oz (97.8 kg)  Height: _0  (1.753 m)   Body mass index is 31.85 kg/m.  Physical Exam  Constitutional: He is oriented to person, place, and time. He appears well-developed.  Frail appearing in NAD  Musculoskeletal: He exhibits edema.  Neurological: He is alert and oriented to person, place, and time.  Psychiatric: He has a normal mood and affect. His behavior is normal. Thought content normal.     Labs reviewed: Nursing Home on 11/01/2016  Component Date Value Ref Range Status  . Glucose 11/02/2016 128   Final  . BUN 11/02/2016 18  4 - 21 Final  . Creatinine 11/02/2016 1.3  0.6 - 1.3 Final  . Potassium 11/02/2016 3.0* 3.4 - 5.3 Final  . Sodium 11/02/2016 144  137 - 147 Final  Appointment on 10/19/2016  Component Date Value Ref Range Status  . Sodium 10/19/2016 143  136 - 145 mEq/L Final  . Potassium 10/19/2016 3.8  3.5 - 5.1  mEq/L Final  . Chloride 10/19/2016 99  98 - 109 mEq/L Final  . CO2 10/19/2016 33* 22 - 29 mEq/L Final  . Glucose 10/19/2016 190* 70 - 140 mg/dl Final   Glucose reference range is for nonfasting patients. Fasting glucose reference range is 70- 100.  Marland Kitchen  BUN 10/19/2016 19.4  7.0 - 26.0 mg/dL Final  . Creatinine 10/19/2016 1.7* 0.7 - 1.3 mg/dL Final  . Total Bilirubin 10/19/2016 0.72  0.20 - 1.20 mg/dL Final  . Alkaline Phosphatase 10/19/2016 232* 40 - 150 U/L Final  . AST 10/19/2016 65* 5 - 34 U/L Final  . ALT 10/19/2016 92* 0 - 55 U/L Final  . Total Protein 10/19/2016 7.2  6.4 - 8.3 g/dL Final  . Albumin 10/19/2016 2.4* 3.5 - 5.0 g/dL Final  . Calcium 10/19/2016 8.9  8.4 - 10.4 mg/dL Final  . Anion Gap 10/19/2016 12* 3 - 11 mEq/L Final  . EGFR 10/19/2016 41* >90 ml/min/1.73 m2 Final   eGFR is calculated using the CKD-EPI Creatinine Equation (2009)  . WBC 10/19/2016 5.5  4.0 - 10.3 10e3/uL Final  . NEUT# 10/19/2016 4.6  1.5 - 6.5 10e3/uL Final  . HGB 10/19/2016 9.5* 13.0 - 17.1 g/dL Final  . HCT 10/19/2016 30.5* 38.4 - 49.9 % Final  . Platelets 10/19/2016 185  140 - 400 10e3/uL Final  . MCV 10/19/2016 85.9  79.3 - 98.0 fL Final  . MCH 10/19/2016 26.8* 27.2 - 33.4 pg Final  . MCHC 10/19/2016 31.1* 32.0 - 36.0 g/dL Final  . RBC 10/19/2016 3.55* 4.20 - 5.82 10e6/uL Final  . RDW 10/19/2016 16.0* 11.0 - 14.6 % Final  . lymph# 10/19/2016 0.4* 0.9 - 3.3 10e3/uL Final  . MONO# 10/19/2016 0.4  0.1 - 0.9 10e3/uL Final  . Eosinophils Absolute 10/19/2016 0.1  0.0 - 0.5 10e3/uL Final  . Basophils Absolute 10/19/2016 0.0  0.0 - 0.1 10e3/uL Final  . NEUT% 10/19/2016 83.5* 39.0 - 75.0 % Final  . LYMPH% 10/19/2016 7.1* 14.0 - 49.0 % Final  . MONO% 10/19/2016 7.2  0.0 - 14.0 % Final  . EOS% 10/19/2016 2.0  0.0 - 7.0 % Final  . BASO% 10/19/2016 0.2  0.0 - 2.0 % Final  . Ferritin 10/19/2016 230  22 - 316 ng/ml Final  . Iron 10/19/2016 29* 42 - 163 ug/dL Final  . TIBC 10/19/2016 191* 202 - 409 ug/dL Final  . UIBC 10/19/2016 162  117 - 376 ug/dL Final  . %SAT 10/19/2016 15* 20 - 55 % Final  . CEA ([facility]) 10/19/2016 1.50  0.00 - 5.00 ng/mL Final   This test was performed using Architect's Chemiluminescent Microparticle Immunoassay. Values  obtained from different assay methods cannot be used interchageably. Please note that 5-10% of patients who smoke may see CEA levels up to 6.9 ng/mL.  Abstract on 10/14/2016  Component Date Value Ref Range Status  . Hemoglobin 10/13/2016 10.0* 13.5 - 17.5 g/dL Final  . HCT 10/13/2016 31* 41 - 53 % Final  . Platelets 10/13/2016 166  150 - 399 K/L Final  . WBC 10/13/2016 7.1  10^3/mL Final  . Glucose 10/13/2016 132  mg/dL Final  . BUN 10/13/2016 23* 4 - 21 mg/dL Final  . Creatinine 10/13/2016 1.6* 0.6 - 1.3 mg/dL Final  . Potassium 10/13/2016 3.5  3.4 - 5.3 mmol/L Final  . Sodium 10/13/2016 145  137 - 147 mmol/L Final  . Alkaline Phosphatase 10/13/2016 154* 25 - 125 U/L Final  . ALT 10/13/2016 72* 10 -  40 U/L Final  . AST 10/13/2016 45* 14 - 40 U/L Final  . Bilirubin, Total 10/13/2016 0.6  mg/dL Final  Nursing Home on 10/14/2016  Component Date Value Ref Range Status  . Hemoglobin 10/14/2016 9.9* 13.5 - 17.5 g/dL Final  . HCT 10/14/2016 30* 41 - 53 % Final  . Neutrophils Absolute 10/14/2016 7  /L Final  . Platelets 10/14/2016 150  150 - 399 K/L Final  . WBC 10/14/2016 7.3  10^3/mL Final  . Glucose 10/14/2016 164  mg/dL Final  . BUN 10/14/2016 21  4 - 21 mg/dL Final  . Creatinine 10/14/2016 1.4* 0.6 - 1.3 mg/dL Final  . Potassium 10/14/2016 3.4  3.4 - 5.3 mmol/L Final  . Sodium 10/14/2016 143  137 - 147 mmol/L Final  Admission on 10/06/2016, Discharged on 10/10/2016  Component Date Value Ref Range Status  . WBC 10/06/2016 9.7  4.0 - 10.5 K/uL Final  . RBC 10/06/2016 3.42* 4.22 - 5.81 MIL/uL Final  . Hemoglobin 10/06/2016 9.4* 13.0 - 17.0 g/dL Final  . HCT 10/06/2016 29.0* 39.0 - 52.0 % Final  . MCV 10/06/2016 84.8  78.0 - 100.0 fL Final  . MCH 10/06/2016 27.5  26.0 - 34.0 pg Final  . MCHC 10/06/2016 32.4  30.0 - 36.0 g/dL Final  . RDW 10/06/2016 16.9* 11.5 - 15.5 % Final  . Platelets 10/06/2016 106* 150 - 400 K/uL Final   Comment: REPEATED TO VERIFY PLATELET COUNT CONFIRMED BY  SMEAR   . Neutrophils Relative % 10/06/2016 77  % Final  . Neutro Abs 10/06/2016 7.5  1.7 - 7.7 K/uL Final  . Lymphocytes Relative 10/06/2016 13  % Final  . Lymphs Abs 10/06/2016 1.2  0.7 - 4.0 K/uL Final  . Monocytes Relative 10/06/2016 10  % Final  . Monocytes Absolute 10/06/2016 0.9  0.1 - 1.0 K/uL Final  . Eosinophils Relative 10/06/2016 1  % Final  . Eosinophils Absolute 10/06/2016 0.1  0.0 - 0.7 K/uL Final  . Basophils Relative 10/06/2016 0  % Final  . Basophils Absolute 10/06/2016 0.0  0.0 - 0.1 K/uL Final  . Sodium 10/06/2016 135  135 - 145 mmol/L Final  . Potassium 10/06/2016 5.4* 3.5 - 5.1 mmol/L Final  . Chloride 10/06/2016 102  101 - 111 mmol/L Final  . CO2 10/06/2016 22  22 - 32 mmol/L Final  . Glucose, Bld 10/06/2016 86  65 - 99 mg/dL Final  . BUN 10/06/2016 69* 6 - 20 mg/dL Final  . Creatinine, Ser 10/06/2016 4.19* 0.61 - 1.24 mg/dL Final  . Calcium 10/06/2016 8.0* 8.9 - 10.3 mg/dL Final  . Total Protein 10/06/2016 6.0* 6.5 - 8.1 g/dL Final  . Albumin 10/06/2016 2.8* 3.5 - 5.0 g/dL Final  . AST 10/06/2016 21  15 - 41 U/L Final  . ALT 10/06/2016 18  17 - 63 U/L Final  . Alkaline Phosphatase 10/06/2016 65  38 - 126 U/L Final  . Total Bilirubin 10/06/2016 1.1  0.3 - 1.2 mg/dL Final  . GFR calc non Af Amer 10/06/2016 13* >60 mL/min Final  . GFR calc Af Amer 10/06/2016 15* >60 mL/min Final   Comment: (NOTE) The eGFR has been calculated using the CKD EPI equation. This calculation has not been validated in all clinical situations. eGFR's persistently <60 mL/min signify possible Chronic Kidney Disease.   . Anion gap 10/06/2016 11  5 - 15 Final  . Troponin i, poc 10/06/2016 0.06  0.00 - 0.08 ng/mL Final  . Comment 3 10/06/2016  Final   Comment: Due to the release kinetics of cTnI, a negative result within the first hours of the onset of symptoms does not rule out myocardial infarction with certainty. If myocardial infarction is still suspected, repeat the test  at appropriate intervals.   . Color, Urine 10/06/2016 YELLOW  YELLOW Final  . APPearance 10/06/2016 CLOUDY* CLEAR Final  . Specific Gravity, Urine 10/06/2016 1.009  1.005 - 1.030 Final  . pH 10/06/2016 5.0  5.0 - 8.0 Final  . Glucose, UA 10/06/2016 NEGATIVE  NEGATIVE mg/dL Final  . Hgb urine dipstick 10/06/2016 SMALL* NEGATIVE Final  . Bilirubin Urine 10/06/2016 NEGATIVE  NEGATIVE Final  . Ketones, ur 10/06/2016 NEGATIVE  NEGATIVE mg/dL Final  . Protein, ur 10/06/2016 30* NEGATIVE mg/dL Final  . Nitrite 10/06/2016 NEGATIVE  NEGATIVE Final  . Leukocytes, UA 10/06/2016 LARGE* NEGATIVE Final  . RBC / HPF 10/06/2016 TOO NUMEROUS TO COUNT  0 - 5 RBC/hpf Final  . WBC, UA 10/06/2016 TOO NUMEROUS TO COUNT  0 - 5 WBC/hpf Final  . Bacteria, UA 10/06/2016 MANY* NONE SEEN Final  . Squamous Epithelial / LPF 10/06/2016 NONE SEEN  NONE SEEN Final  . Mucous 10/06/2016 PRESENT   Final  . Hyaline Casts, UA 10/06/2016 PRESENT   Final  . Specimen Description 10/06/2016 URINE, RANDOM   Final  . Special Requests 10/06/2016 NONE   Final  . Culture 10/06/2016 >=100,000 COLONIES/mL VANCOMYCIN RESISTANT ENTEROCOCCUS*  Final  . Report Status 10/06/2016 10/09/2016 FINAL   Final  . Organism ID, Bacteria 10/06/2016 VANCOMYCIN RESISTANT ENTEROCOCCUS*  Final  . WBC 10/07/2016 8.3  4.0 - 10.5 K/uL Final  . RBC 10/07/2016 3.18* 4.22 - 5.81 MIL/uL Final  . Hemoglobin 10/07/2016 8.9* 13.0 - 17.0 g/dL Final  . HCT 10/07/2016 27.3* 39.0 - 52.0 % Final  . MCV 10/07/2016 85.8  78.0 - 100.0 fL Final  . MCH 10/07/2016 28.0  26.0 - 34.0 pg Final  . MCHC 10/07/2016 32.6  30.0 - 36.0 g/dL Final  . RDW 10/07/2016 16.9* 11.5 - 15.5 % Final  . Platelets 10/07/2016 104* 150 - 400 K/uL Final   Comment: REPEATED TO VERIFY CONSISTENT WITH PREVIOUS RESULT   . Sodium 10/07/2016 136  135 - 145 mmol/L Final  . Potassium 10/07/2016 4.4  3.5 - 5.1 mmol/L Final  . Chloride 10/07/2016 100* 101 - 111 mmol/L Final  . CO2 10/07/2016 21* 22  - 32 mmol/L Final  . Glucose, Bld 10/07/2016 91  65 - 99 mg/dL Final  . BUN 10/07/2016 69* 6 - 20 mg/dL Final  . Creatinine, Ser 10/07/2016 4.10* 0.61 - 1.24 mg/dL Final  . Calcium 10/07/2016 7.7* 8.9 - 10.3 mg/dL Final  . GFR calc non Af Amer 10/07/2016 14* >60 mL/min Final  . GFR calc Af Amer 10/07/2016 16* >60 mL/min Final   Comment: (NOTE) The eGFR has been calculated using the CKD EPI equation. This calculation has not been validated in all clinical situations. eGFR's persistently <60 mL/min signify possible Chronic Kidney Disease.   . Anion gap 10/07/2016 15  5 - 15 Final  . Urea Nitrogen, Ur 10/06/2016 429  Not Estab. mg/dL Final   Comment: (NOTE) Performed At: [facility] [place], [place] [id number] Lindon Romp MD MW:[id number]   . Creatinine, Urine 10/06/2016 77.87  mg/dL Final  . Sodium 10/08/2016 139  135 - 145 mmol/L Final  . Potassium 10/08/2016 3.3* 3.5 - 5.1 mmol/L Final   DELTA CHECK NOTED  . Chloride 10/08/2016 103  101 - 111 mmol/L Final  . CO2 10/08/2016 27  22 - 32 mmol/L Final  . Glucose, Bld 10/08/2016 119* 65 - 99 mg/dL Final  . BUN 10/08/2016 46* 6 - 20 mg/dL Final  . Creatinine, Ser 10/08/2016 2.82* 0.61 - 1.24 mg/dL Final   DELTA CHECK NOTED  . Calcium 10/08/2016 8.0* 8.9 - 10.3 mg/dL Final  . GFR calc non Af Amer 10/08/2016 21* >60 mL/min Final  . GFR calc Af Amer 10/08/2016 25* >60 mL/min Final   Comment: (NOTE) The eGFR has been calculated using the CKD EPI equation. This calculation has not been validated in all clinical situations. eGFR's persistently <60 mL/min signify possible Chronic Kidney Disease.   . Anion gap 10/08/2016 9  5 - 15 Final  . Troponin I 10/08/2016 0.05* <0.03 ng/mL Final   Comment: CRITICAL RESULT CALLED TO, READ BACK BY AND VERIFIED WITH: GENGLER K,RN 10/08/16 2210 [place]   . Troponin I 10/09/2016 0.07* <0.03 ng/mL Final   CRITICAL VALUE NOTED.  VALUE IS CONSISTENT WITH PREVIOUSLY REPORTED AND  CALLED VALUE.  . Troponin I 10/09/2016 0.05* <0.03 ng/mL Final   CRITICAL VALUE NOTED.  VALUE IS CONSISTENT WITH PREVIOUSLY REPORTED AND CALLED VALUE.  Marland Kitchen Specimen Description 10/09/2016 BLOOD RIGHT HAND   Final  . Special Requests 10/09/2016 BOTTLES DRAWN AEROBIC ONLY BCAV   Final  . Culture 10/09/2016 NO GROWTH 5 DAYS   Final  . Report Status 10/09/2016 10/14/2016 FINAL   Final  . Specimen Description 10/09/2016 BLOOD LEFT HAND   Final  . Special Requests 10/09/2016 BOTTLES DRAWN AEROBIC ONLY BCAV   Final  . Culture 10/09/2016 NO GROWTH 5 DAYS   Final  . Report Status 10/09/2016 10/14/2016 FINAL   Final  . Sodium 10/09/2016 143  135 - 145 mmol/L Final  . Potassium 10/09/2016 3.3* 3.5 - 5.1 mmol/L Final  . Chloride 10/09/2016 109  101 - 111 mmol/L Final  . CO2 10/09/2016 24  22 - 32 mmol/L Final  . Glucose, Bld 10/09/2016 133* 65 - 99 mg/dL Final  . BUN 10/09/2016 35* 6 - 20 mg/dL Final  . Creatinine, Ser 10/09/2016 2.23* 0.61 - 1.24 mg/dL Final  . Calcium 10/09/2016 8.2* 8.9 - 10.3 mg/dL Final  . GFR calc non Af Amer 10/09/2016 29* >60 mL/min Final  . GFR calc Af Amer 10/09/2016 33* >60 mL/min Final   Comment: (NOTE) The eGFR has been calculated using the CKD EPI equation. This calculation has not been validated in all clinical situations. eGFR's persistently <60 mL/min signify possible Chronic Kidney Disease.   . Anion gap 10/09/2016 10  5 - 15 Final  . WBC 10/09/2016 5.9  4.0 - 10.5 K/uL Final  . RBC 10/09/2016 3.22* 4.22 - 5.81 MIL/uL Final  . Hemoglobin 10/09/2016 9.3* 13.0 - 17.0 g/dL Final  . HCT 10/09/2016 28.3* 39.0 - 52.0 % Final  . MCV 10/09/2016 87.9  78.0 - 100.0 fL Final  . MCH 10/09/2016 28.9  26.0 - 34.0 pg Final  . MCHC 10/09/2016 32.9  30.0 - 36.0 g/dL Final  . RDW 10/09/2016 16.4* 11.5 - 15.5 % Final  . Platelets 10/09/2016 93* 150 - 400 K/uL Final   Comment: CONSISTENT WITH PREVIOUS RESULT REPEATED TO VERIFY   . Sodium 10/10/2016 142  135 - 145 mmol/L Final   . Potassium 10/10/2016 3.4* 3.5 - 5.1 mmol/L Final  . Chloride 10/10/2016 108  101 - 111 mmol/L Final  . CO2 10/10/2016 23  22 - 32 mmol/L Final  .  Glucose, Bld 10/10/2016 105* 65 - 99 mg/dL Final  . BUN 10/10/2016 21* 6 - 20 mg/dL Final  . Creatinine, Ser 10/10/2016 1.62* 0.61 - 1.24 mg/dL Final  . Calcium 10/10/2016 8.2* 8.9 - 10.3 mg/dL Final  . GFR calc non Af Amer 10/10/2016 42* >60 mL/min Final  . GFR calc Af Amer 10/10/2016 49* >60 mL/min Final   Comment: (NOTE) The eGFR has been calculated using the CKD EPI equation. This calculation has not been validated in all clinical situations. eGFR's persistently <60 mL/min signify possible Chronic Kidney Disease.   . Anion gap 10/10/2016 11  5 - 15 Final  . WBC 10/10/2016 8.1  4.0 - 10.5 K/uL Final  . RBC 10/10/2016 3.18* 4.22 - 5.81 MIL/uL Final  . Hemoglobin 10/10/2016 9.0* 13.0 - 17.0 g/dL Final  . HCT 10/10/2016 28.0* 39.0 - 52.0 % Final  . MCV 10/10/2016 88.1  78.0 - 100.0 fL Final  . MCH 10/10/2016 28.3  26.0 - 34.0 pg Final  . MCHC 10/10/2016 32.1  30.0 - 36.0 g/dL Final  . RDW 10/10/2016 16.5* 11.5 - 15.5 % Final  . Platelets 10/10/2016 105* 150 - 400 K/uL Final   CONSISTENT WITH PREVIOUS RESULT  Abstract on 10/07/2016  Component Date Value Ref Range Status  . Glucose 10/06/2016 105  mg/dL Final  . BUN 10/06/2016 62* 4 - 21 mg/dL Final  . Creatinine 10/06/2016 3.9* 0.6 - 1.3 mg/dL Final  . Potassium 10/06/2016 5.7* 3.4 - 5.3 mmol/L Final  . Sodium 10/06/2016 139  137 - 147 mmol/L Final  Admission on 09/28/2016, Discharged on 10/02/2016  Component Date Value Ref Range Status  . pH, Arterial 09/28/2016 7.345* 7.350 - 7.450 Final  . pCO2 arterial 09/28/2016 52.3* 32.0 - 48.0 mmHg Final  . pO2, Arterial 09/28/2016 130.0* 83.0 - 108.0 mmHg Final  . Bicarbonate 09/28/2016 28.5* 20.0 - 28.0 mmol/L Final  . TCO2 09/28/2016 30  0 - 100 mmol/L Final  . O2 Saturation 09/28/2016 99.0  % Final  . Acid-Base Excess 09/28/2016 2.0   0.0 - 2.0 mmol/L Final  . Sodium 09/28/2016 143  135 - 145 mmol/L Final  . Potassium 09/28/2016 4.0  3.5 - 5.1 mmol/L Final  . Calcium, Ion 09/28/2016 1.19  1.15 - 1.40 mmol/L Final  . HCT 09/28/2016 26.0* 39.0 - 52.0 % Final  . Hemoglobin 09/28/2016 8.8* 13.0 - 17.0 g/dL Final  . Patient temperature 09/28/2016 HIDE   Final  . Sample type 09/28/2016 ARTERIAL   Final  . MRSA by PCR 09/28/2016 POSITIVE* NEGATIVE Final   Comment:        The GeneXpert MRSA Assay (FDA approved for NASAL specimens only), is one component of a comprehensive MRSA colonization surveillance program. It is not intended to diagnose MRSA infection nor to guide or monitor treatment for MRSA infections. RESULT CALLED TO, READ BACK BY AND VERIFIED WITH: S.ODOM RN AT 1931 ON 09/28/16 BY S.VANHOORNE   . Sodium 09/29/2016 139  135 - 145 mmol/L Final  . Potassium 09/29/2016 3.9  3.5 - 5.1 mmol/L Final  . Chloride 09/29/2016 105  101 - 111 mmol/L Final  . CO2 09/29/2016 26  22 - 32 mmol/L Final  . Glucose, Bld 09/29/2016 160* 65 - 99 mg/dL Final  . BUN 09/29/2016 20  6 - 20 mg/dL Final  . Creatinine, Ser 09/29/2016 1.37* 0.61 - 1.24 mg/dL Final  . Calcium 09/29/2016 8.2* 8.9 - 10.3 mg/dL Final  . GFR calc non Af Amer 09/29/2016 51* >60  mL/min Final  . GFR calc Af Amer 09/29/2016 60* >60 mL/min Final   Comment: (NOTE) The eGFR has been calculated using the CKD EPI equation. This calculation has not been validated in all clinical situations. eGFR's persistently <60 mL/min signify possible Chronic Kidney Disease.   . Anion gap 09/29/2016 8  5 - 15 Final  . WBC 09/29/2016 9.9  4.0 - 10.5 K/uL Final  . RBC 09/29/2016 3.49* 4.22 - 5.81 MIL/uL Final  . Hemoglobin 09/29/2016 10.0* 13.0 - 17.0 g/dL Final  . HCT 09/29/2016 29.9* 39.0 - 52.0 % Final  . MCV 09/29/2016 85.7  78.0 - 100.0 fL Final  . MCH 09/29/2016 28.7  26.0 - 34.0 pg Final  . MCHC 09/29/2016 33.4  30.0 - 36.0 g/dL Final  . RDW 09/29/2016 16.4* 11.5 - 15.5  % Final  . Platelets 09/29/2016 97* 150 - 400 K/uL Final   Comment: SPECIMEN CHECKED FOR CLOTS REPEATED TO VERIFY PLATELET COUNT CONFIRMED BY SMEAR   . Magnesium 09/29/2016 1.6* 1.7 - 2.4 mg/dL Final  . Glucose-Capillary 09/28/2016 127* 65 - 99 mg/dL Final  . Comment 1 09/28/2016 Notify RN   Final  . Comment 2 09/28/2016 Document in Chart   Final  . Glucose-Capillary 09/29/2016 130* 65 - 99 mg/dL Final  . WBC 09/30/2016 10.4  4.0 - 10.5 K/uL Final  . RBC 09/30/2016 3.36* 4.22 - 5.81 MIL/uL Final  . Hemoglobin 09/30/2016 9.8* 13.0 - 17.0 g/dL Final  . HCT 09/30/2016 29.2* 39.0 - 52.0 % Final  . MCV 09/30/2016 86.9  78.0 - 100.0 fL Final  . MCH 09/30/2016 29.2  26.0 - 34.0 pg Final  . MCHC 09/30/2016 33.6  30.0 - 36.0 g/dL Final  . RDW 09/30/2016 16.6* 11.5 - 15.5 % Final  . Platelets 09/30/2016 89* 150 - 400 K/uL Final   CONSISTENT WITH PREVIOUS RESULT  . Potassium 09/30/2016 3.7  3.5 - 5.1 mmol/L Final  . Creatinine, Ser 09/30/2016 1.16  0.61 - 1.24 mg/dL Final  . GFR calc non Af Amer 09/30/2016 >60  >60 mL/min Final  . GFR calc Af Amer 09/30/2016 >60  >60 mL/min Final   Comment: (NOTE) The eGFR has been calculated using the CKD EPI equation. This calculation has not been validated in all clinical situations. eGFR's persistently <60 mL/min signify possible Chronic Kidney Disease.   . Magnesium 09/30/2016 2.5* 1.7 - 2.4 mg/dL Final  . WBC 10/01/2016 8.3  4.0 - 10.5 K/uL Final  . RBC 10/01/2016 3.42* 4.22 - 5.81 MIL/uL Final  . Hemoglobin 10/01/2016 9.9* 13.0 - 17.0 g/dL Final  . HCT 10/01/2016 29.7* 39.0 - 52.0 % Final  . MCV 10/01/2016 86.8  78.0 - 100.0 fL Final  . MCH 10/01/2016 28.9  26.0 - 34.0 pg Final  . MCHC 10/01/2016 33.3  30.0 - 36.0 g/dL Final  . RDW 10/01/2016 16.4* 11.5 - 15.5 % Final  . Platelets 10/01/2016 79* 150 - 400 K/uL Final   CONSISTENT WITH PREVIOUS RESULT  . Potassium 10/01/2016 3.8  3.5 - 5.1 mmol/L Final  . Creatinine, Ser 10/01/2016 1.30* 0.61 -  1.24 mg/dL Final  . GFR calc non Af Amer 10/01/2016 55* >60 mL/min Final  . GFR calc Af Amer 10/01/2016 >60  >60 mL/min Final   Comment: (NOTE) The eGFR has been calculated using the CKD EPI equation. This calculation has not been validated in all clinical situations. eGFR's persistently <60 mL/min signify possible Chronic Kidney Disease.   . WBC 10/02/2016 8.5  4.0 -  10.5 K/uL Final  . RBC 10/02/2016 3.60* 4.22 - 5.81 MIL/uL Final  . Hemoglobin 10/02/2016 10.3* 13.0 - 17.0 g/dL Final  . HCT 10/02/2016 31.1* 39.0 - 52.0 % Final  . MCV 10/02/2016 86.4  78.0 - 100.0 fL Final  . MCH 10/02/2016 28.6  26.0 - 34.0 pg Final  . MCHC 10/02/2016 33.1  30.0 - 36.0 g/dL Final  . RDW 10/02/2016 16.3* 11.5 - 15.5 % Final  . Platelets 10/02/2016 97* 150 - 400 K/uL Final   CONSISTENT WITH PREVIOUS RESULT  . Potassium 10/02/2016 3.7  3.5 - 5.1 mmol/L Final  . Creatinine, Ser 10/02/2016 1.28* 0.61 - 1.24 mg/dL Final  . GFR calc non Af Amer 10/02/2016 56* >60 mL/min Final  . GFR calc Af Amer 10/02/2016 >60  >60 mL/min Final   Comment: (NOTE) The eGFR has been calculated using the CKD EPI equation. This calculation has not been validated in all clinical situations. eGFR's persistently <60 mL/min signify possible Chronic Kidney Disease.   Orders Only on 09/26/2016  Component Date Value Ref Range Status  . Glucose 09/26/2016 150* 65 - 99 mg/dL Final  . BUN 09/26/2016 23  8 - 27 mg/dL Final  . Creatinine, Ser 09/26/2016 1.75* 0.76 - 1.27 mg/dL Final  . GFR calc non Af Amer 09/26/2016 39* >59 mL/min/1.73 Final  . GFR calc Af Amer 09/26/2016 45* >59 mL/min/1.73 Final  . BUN/Creatinine Ratio 09/26/2016 13  10 - 24 Final  . Sodium 09/26/2016 146* 134 - 144 mmol/L Final  . Potassium 09/26/2016 3.8  3.5 - 5.2 mmol/L Final  . Chloride 09/26/2016 100  96 - 106 mmol/L Final  . CO2 09/26/2016 29  18 - 29 mmol/L Final  . Calcium 09/26/2016 9.1  8.6 - 10.2 mg/dL Final  . Magnesium 09/26/2016 2.0  1.6 - 2.3  mg/dL Final  Lab on 09/26/2016  Component Date Value Ref Range Status  . Glucose 09/26/2016 152* 65 - 99 mg/dL Final  . BUN 09/26/2016 23  8 - 27 mg/dL Final  . Creatinine, Ser 09/26/2016 1.47* 0.76 - 1.27 mg/dL Final  . GFR calc non Af Amer 09/26/2016 48* >59 mL/min/1.73 Final  . GFR calc Af Amer 09/26/2016 56* >59 mL/min/1.73 Final  . BUN/Creatinine Ratio 09/26/2016 16  10 - 24 Final  . Sodium 09/26/2016 148* 134 - 144 mmol/L Final  . Potassium 09/26/2016 3.8  3.5 - 5.2 mmol/L Final  . Chloride 09/26/2016 100  96 - 106 mmol/L Final  . CO2 09/26/2016 30* 18 - 29 mmol/L Final  . Calcium 09/26/2016 8.9  8.6 - 10.2 mg/dL Final  . Magnesium 09/26/2016 2.0  1.6 - 2.3 mg/dL Final  . WBC 09/26/2016 WILL FOLLOW   Preliminary  . RBC 09/26/2016 WILL FOLLOW   Preliminary  . Hemoglobin 09/26/2016 WILL FOLLOW   Preliminary  . Hematocrit 09/26/2016 WILL FOLLOW   Preliminary  . MCV 09/26/2016 WILL FOLLOW   Preliminary  . [facility] 09/26/2016 WILL FOLLOW   Preliminary  . MCHC 09/26/2016 WILL FOLLOW   Preliminary  . RDW 09/26/2016 WILL FOLLOW   Preliminary  . Platelets 09/26/2016 WILL FOLLOW   Preliminary  . Neutrophils 09/26/2016 WILL FOLLOW   Preliminary  . Lymphs 09/26/2016 WILL FOLLOW   Preliminary  . Monocytes 09/26/2016 WILL FOLLOW   Preliminary  . Eos 09/26/2016 WILL FOLLOW   Preliminary  . Basos 09/26/2016 WILL FOLLOW   Preliminary  . Neutrophils Absolute 09/26/2016 WILL FOLLOW   Preliminary  . Lymphocytes Absolute 09/26/2016 WILL FOLLOW  Preliminary  . Monocytes Absolute 09/26/2016 WILL FOLLOW   Preliminary  . EOS (ABSOLUTE) 09/26/2016 WILL FOLLOW   Preliminary  . Basophils Absolute 09/26/2016 WILL FOLLOW   Preliminary  . Immature Granulocytes 09/26/2016 WILL FOLLOW   Preliminary  . Immature Grans (Abs) 09/26/2016 WILL FOLLOW   Preliminary  Hospital Outpatient Visit on 09/23/2016  Component Date Value Ref Range Status  . Hgb A1c MFr Bld 09/23/2016 5.7* 4.8 - 5.6 % Final   Comment: (NOTE)          Pre-diabetes: 5.7 - 6.4         Diabetes: >6.4         Glycemic control for adults with diabetes: <7.0   . Mean Plasma Glucose 09/23/2016 117  mg/dL Final   Comment: (NOTE) Performed At: [facility] [place], [place] [id number] Lindon Romp MD YI:[id number]   . Sodium 09/23/2016 143  135 - 145 mmol/L Final  . Potassium 09/23/2016 2.6* 3.5 - 5.1 mmol/L Final   Comment: CRITICAL RESULT CALLED TO, READ BACK BY AND VERIFIED WITH: T.WISE AT 1725 09/23/16 BY N.THOMPSON CRITICAL RESULT CALLED TO, READ BACK BY AND VERIFIED WITH: DR Excell Seltzer AT 5009 09/23/16 PER T.WISE   . Chloride 09/23/2016 98* 101 - 111 mmol/L Final  . CO2 09/23/2016 32  22 - 32 mmol/L Final  . Glucose, Bld 09/23/2016 130* 65 - 99 mg/dL Final  . BUN 09/23/2016 43* 6 - 20 mg/dL Final  . Creatinine, Ser 09/23/2016 1.55* 0.61 - 1.24 mg/dL Final  . Calcium 09/23/2016 9.2  8.9 - 10.3 mg/dL Final  . GFR calc non Af Amer 09/23/2016 44* >60 mL/min Final  . GFR calc Af Amer 09/23/2016 51* >60 mL/min Final   Comment: (NOTE) The eGFR has been calculated using the CKD EPI equation. This calculation has not been validated in all clinical situations. eGFR's persistently <60 mL/min signify possible Chronic Kidney Disease.   . Anion gap 09/23/2016 13  5 - 15 Final  . WBC 09/23/2016 6.0  4.0 - 10.5 K/uL Final  . RBC 09/23/2016 3.76* 4.22 - 5.81 MIL/uL Final  . Hemoglobin 09/23/2016 10.6* 13.0 - 17.0 g/dL Final  . HCT 09/23/2016 32.3* 39.0 - 52.0 % Final  . MCV 09/23/2016 85.9  78.0 - 100.0 fL Final  . MCH 09/23/2016 28.2  26.0 - 34.0 pg Final  . MCHC 09/23/2016 32.8  30.0 - 36.0 g/dL Final  . RDW 09/23/2016 17.0* 11.5 - 15.5 % Final  . Platelets 09/23/2016 109* 150 - 400 K/uL Final   CONSISTENT WITH PREVIOUS RESULT  . ABO/RH(D) 09/23/2016 O NEG   Final  . Antibody Screen 09/23/2016 NEG   Final  . Sample Expiration 09/23/2016 10/01/2016   Final  . Extend sample reason 09/23/2016 NO TRANSFUSIONS OR  PREGNANCY IN THE PAST 3 MONTHS   Final  . Unit Number 09/23/2016 [id number]   Final  . Blood Component Type 09/23/2016 RBC LR PHER1   Final  . Unit division 09/23/2016 00   Final  . Status of Unit 09/23/2016 ISSUED,FINAL   Final  . Transfusion Status 09/23/2016 OK TO TRANSFUSE   Final  . Crossmatch Result 09/23/2016 Compatible   Final  . Unit Number 09/23/2016 [id number]   Final  . Blood Component Type 09/23/2016 RED CELLS,LR   Final  . Unit division 09/23/2016 00   Final  . Status of Unit 09/23/2016 REL FROM [facility]   Final  . Transfusion Status 09/23/2016 OK TO TRANSFUSE   Final  .  Crossmatch Result 09/23/2016 Compatible   Final  . ABO/RH(D) 09/23/2016 O NEG   Final  . ISSUE DATE / TIME 09/23/2016 [id number]   Final  . Blood Product Unit Number 09/23/2016 [id number]   Final  . PRODUCT CODE 09/23/2016 [id number]   Final  . Unit Type and Rh 09/23/2016 9500   Final  . Blood Product Expiration Date 09/23/2016 [id number]   Final  . Blood Product Unit Number 09/23/2016 [id number]   Final  . Unit Type and Rh 09/23/2016 9500   Final  . Blood Product Expiration Date 09/23/2016 [id number]   Final  There may be more visits with results that are not included.    No results found.   Assessment/Plan   ICD-10-CM   1. Physical deconditioning R53.81   2. Chronic gout of multiple sites, unspecified cause M1A.09X0   3. 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 Chronic systolic heart failure (HCC) I50.22   5. Iron deficiency anemia due to chronic blood loss D50.0   6. Chronic kidney disease (CKD), stage III (moderate) N18.3   7. Thrombocytopenia ([place]) D69.6      Patient is being discharged with home health services:  PT/OT, CNA, RN  Patient is being discharged with the following durable medical equipment: 3-in-1 commode; rolling walker   Patient has been advised to f/u with their PCP in 1-2 weeks for a transitions of care visit. (Social services at their facility was responsible  for arranging this appointment.)  Pt was provided with adequate prescriptions of noncontrolled medications to reach their scheduled appointment . For controlled substances, a limited supply was provided as appropriate for the individual patient. If the pt normally receives these medications from a pain clinic or has a contract with another physician, these medications should be received from that clinic or physician only.  Future labs/tests needed:  None   TIME SPENT (MINUTES): [place]. Perlie Gold  [facility] and [facility] [address] [place], [place] [address] [phone] Cell (Monday-Friday 8 AM - 5 PM) [phone] After 5 PM and follow prompts

## 2016-11-10 NOTE — Patient Instructions (Signed)
Medication Instructions:  Continue current medications  Labwork: None Ordered  Testing/Procedures: None Ordered  Follow-Up: Your physician recommends that you schedule a follow-up appointment in: 2 Months.   Any Other Special Instructions Will Be Listed Below (If Applicable).   If you need a refill on your cardiac medications before your next appointment, please call your pharmacy.   

## 2016-11-11 LAB — CBC AND DIFFERENTIAL
HEMATOCRIT: 35 — AB (ref 41–53)
Hemoglobin: 11.3 — AB (ref 13.5–17.5)
NEUTROS ABS: 6
PLATELETS: 154 (ref 150–399)
WBC: 7.6

## 2016-11-11 LAB — HEPATIC FUNCTION PANEL
ALT: 18 (ref 10–40)
AST: 16 (ref 14–40)
Alkaline Phosphatase: 157 — AB (ref 25–125)
BILIRUBIN, TOTAL: 0.6

## 2016-11-11 LAB — BASIC METABOLIC PANEL
BUN: 18 (ref 4–21)
Creatinine: 1.2 (ref 0.6–1.3)
GLUCOSE: 124
Potassium: 3.5 (ref 3.4–5.3)
SODIUM: 138 (ref 137–147)

## 2016-11-12 NOTE — Progress Notes (Signed)
No show today   This encounter was created in error - please disregard. 

## 2016-11-16 ENCOUNTER — Telehealth: Payer: Self-pay | Admitting: Hematology

## 2016-11-16 NOTE — Telephone Encounter (Signed)
lvm to inform pt of 7/12 appt at 1430 per sch msg

## 2016-11-18 ENCOUNTER — Other Ambulatory Visit: Payer: Self-pay | Admitting: *Deleted

## 2016-11-18 ENCOUNTER — Encounter: Payer: Self-pay | Admitting: *Deleted

## 2016-11-18 NOTE — Patient Outreach (Signed)
[facility]) Care Management  11/18/2016  WALLER MARCUSSEN 08/16/1950 [id number]   CSW was able to meet with patient and patient's sister, Thi Sisemore today at [facility] [place] where they currently reside to receive short-term rehabilitative services.  Both patient and Ms. Mcintire won their appeals with [employer] and were approved for [employer] coverage through the [place]. Patient and Ms. Cena plan to remain at [facility], for at least an additional three months. Patient and Ms. Buchheit would like to get stronger, more independent and be able to perform activities of daily living independently.   CSW will perform a case closure on patient, as all goals of treatment have been met from social work standpoint and no additional social work needs have been identified at this time.  CSW will fax an update to patient's Primary Care Physician, Dr. Hyman Bible to ensure that they are aware of CSW's involvement with patient's plan of care.  CSW will submit a case closure request to Verlon Setting, Care Management Assistant with [facility], in the form of an [employer].   [name], BSW, MSW, LCSW  Licensed Education officer, [facility]  Mailing [place] [address] [place], [place] [address] Physical Address-[address], [place], [place] [address] Toll Free Main # [phone] Fax # [phone] Cell # [phone]  Office # [phone] [email or web address]

## 2016-11-18 NOTE — Patient Outreach (Deleted)
[facility]) Care Management  11/18/2016  Patrick [name] [birth year] [id number]

## 2016-11-21 ENCOUNTER — Telehealth: Payer: Self-pay | Admitting: Hematology

## 2016-11-21 NOTE — Telephone Encounter (Signed)
Received new patient referral from [facility] for established patient with f/u currently on schedule for 7/12. Referral forwarded to provider. [facility] made aware. S/w Butch Penny - Dr. Johney Maine nurse in clinic.

## 2016-11-23 ENCOUNTER — Non-Acute Institutional Stay (SKILLED_NURSING_FACILITY): Payer: Medicare Other | Admitting: Adult Health

## 2016-11-23 ENCOUNTER — Encounter: Payer: Self-pay | Admitting: Adult Health

## 2016-11-23 DIAGNOSIS — I255 Ischemic cardiomyopathy: Secondary | ICD-10-CM | POA: Diagnosis not present

## 2016-11-23 DIAGNOSIS — C20 Malignant neoplasm of rectum: Secondary | ICD-10-CM | POA: Diagnosis not present

## 2016-11-23 DIAGNOSIS — I5022 Chronic systolic (congestive) heart failure: Secondary | ICD-10-CM | POA: Diagnosis not present

## 2016-11-23 NOTE — Progress Notes (Signed)
Location:   [place] Room Number: 118 A Place of Service:  SNF (31)    CODE STATUS: Full Code  Allergies  Allergen Reactions  . Nsaids     Kidney disease    Chief Complaint  Patient presents with  . Discharge Note    Discharging to Home    HPI:  He is being discharged to home with home health for pt/ot/rn/cna. He will need a standard wheelchair and 3:1 commode. He will need his prescriptions written; colostomy supplies; and will need to follow up with his medical provider.    Past Medical History:  Diagnosis Date  . Arthritis    "right hand; right knee" (06/19/2014)  . CAD (coronary artery disease)    2v CAD with subtotal occlusion of small co-dominant RCA and borderline lesion in moderate-sized OM-1  . CHF (congestive heart failure) (HCC)    Preserved EF  . Coronary artery disease involving native coronary artery of native heart with angina pectoris ([place]) 09/11/2016  . Degenerative joint disease of knee, right   . Dysrhythmia   . Enlarged prostate   . Hyperlipidemia   . Hypertension   . Pneumonia 05/2014  . Prediabetes 10/03/2014  . Scrotal edema 04/03/2015  . SMALL BOWEL OBSTRUCTION, HX OF 08/21/2007   Annotation: with narrowing in the ileocecal region Qualifier: Diagnosis of  By: Hassell Done FNP, Tori Milks      Past Surgical History:  Procedure Laterality Date  . CARDIAC CATHETERIZATION N/A 02/16/2016   Procedure: Right/Left Heart Cath and Coronary Angiography;  Surgeon: Jolaine Artist, MD;  Location: [place] CV LAB;  Service: Cardiovascular;  Laterality: N/A;  . COLONOSCOPY N/A 04/01/2016   Procedure: COLONOSCOPY;  Surgeon: Leighton Ruff, MD;  Location: [facility] ENDOSCOPY;  Service: Endoscopy;  Laterality: N/A;  . INGUINAL HERNIA REPAIR Left 1990's  . IR GENERIC HISTORICAL  07/05/2016   IR RADIOLOGIST EVAL & MGMT 07/05/2016 Corrie Mckusick, DO [facility] INTERV RAD  . XI ROBOT ABDOMINAL PERINEAL RESECTION N/A 09/28/2016   Procedure: XI ROBOT ABDOMINOPERINEAL  RESECTION WITH PERMANENT COLOSTOMY ERAS PATHWAY;  Surgeon: Michael Boston, MD;  Location: [facility] ORS;  Service: General;  Laterality: N/A;    Social History   Social History  . Marital status: Single    Spouse name: N/A  . Number of children: N/A  . Years of education: N/A   Occupational History  . Not on file.   Social History Main Topics  . Smoking status: Former Smoker    Years: 5.00    Types: Pipe, Landscape architect  . Smokeless tobacco: Never Used     Comment: 06/19/2014 "stopped smoking in ~ 2014; used to smoke a pipe or cigar a couple times/month"  . Alcohol use No  . Drug use: No  . Sexual activity: No   Other Topics Concern  . Not on file   Social History Narrative   Single, lives w/his sister of whom he is caregiver      Family History  Problem Relation Age of Onset  . Hypertension Mother   . Diabetes Mother   . Stroke Mother   . Cancer Father 56       Prostate  . Dementia Father   . COPD Sister   . Arthritis Sister   . Edema Sister     VITAL SIGNS BP 126/76   Pulse 74   Temp 98.6 F (37 C)   Resp 16   Ht '5\' 9"'$  (1.753 m)   Wt 180 lb (81.6 kg)  SpO2 97%   BMI 26.58 kg/m   Patient's Medications  New Prescriptions   No medications on file  Previous Medications   ACETAMINOPHEN (TYLENOL) 500 MG TABLET    Take 1,000 mg by mouth every 6 (six) hours as needed (for pain/fever/headaches.).    ALLOPURINOL (ZYLOPRIM) 300 MG TABLET    Take 300 mg by mouth daily.    ASCORBIC ACID (VITAMIN C) 1000 MG TABLET    Take 2,000 mg by mouth 2 (two) times daily.   ASPIRIN EC 81 MG EC TABLET    Take 1 tablet (81 mg total) by mouth daily.   CARVEDILOL (COREG) 25 MG TABLET    Take 1 tablet (25 mg total) by mouth 2 (two) times daily.   COLCHICINE 0.6 MG TABLET    Take 0.6 mg by mouth 2 (two) times daily as needed.   FERROUS SULFATE 325 (65 FE) MG TABLET    Take 325 mg by mouth 2 (two) times daily with a meal.   FINASTERIDE (PROSCAR) 5 MG TABLET    take 1 tablet by mouth once daily    IPRATROPIUM-ALBUTEROL (DUONEB) 0.5-2.5 (3) MG/3ML SOLN    Take 3 mLs by nebulization every 4 (four) hours as needed.   LACTOBACILLUS (ACIDOPHOLUS PO)    Give 1 Capsule by mouth two times a day for prophylaxis for 3 weeks   LOVASTATIN (MEVACOR) 20 MG TABLET    take 1 tablet by mouth at bedtime   MENTHOL, TOPICAL ANALGESIC, (BIOFREEZE ROLL-ON) 4 % GEL    Apply to bilateral knees topically every 12 hours as needed for pain   METHOCARBAMOL (ROBAXIN) 750 MG TABLET    Take 1 tablet (750 mg total) by mouth 4 (four) times daily as needed (use for muscle cramps/pain).   MIRTAZAPINE (REMERON) 30 MG TABLET    Take 1 tablet (30 mg total) by mouth at bedtime.   MULTIPLE VITAMINS-MINERALS (MULTIVITAMIN WITH MINERALS) TABLET    Take 1 tablet by mouth daily. Men's 50+ Multivitamin   NITROGLYCERIN (NITROSTAT) 0.4 MG SL TABLET    Place 1 tablet (0.4 mg total) under the tongue every 5 (five) minutes as needed for chest pain.   OMEGA-3 1000 MG CAPS    Take 2,000 mg by mouth 2 (two) times daily.    OXYCODONE HCL, ABUSE DETER, (OXAYDO) 5 MG TABA    Give 1-2 tablets by mouth every 4 hours as needed for Moderate to Severe pain.  Hold for sedation or respiratory depression.   PANTOPRAZOLE (PROTONIX) 40 MG TABLET    Take 1 tablet (40 mg total) by mouth daily.   POTASSIUM CHLORIDE SA (K-DUR,KLOR-CON) 20 MEQ TABLET    Take 60 mEq by mouth every morning. And 20 meq by mouth in the evening   TAMSULOSIN (FLOMAX) 0.4 MG CAPS CAPSULE    Take 0.4 mg by mouth daily.    TORSEMIDE (DEMADEX) 20 MG TABLET    Take 40 mg by mouth daily after supper.    TRAZODONE (DESYREL) 100 MG TABLET    Take 300 mg by mouth at bedtime.   Modified Medications   No medications on file  Discontinued Medications   METHYLPREDNISOLONE (MEDROL DOSEPAK) 4 MG TBPK TABLET    Day 1 - give 6 tablets by mouth once daily Day 2 - Give 5 tablets by mouth once daily Day 3 - Give 4 tablets by mouth once daily Day 4 - Give 3 tablets by mouth once daily Day 5 - Give 2  tablets by mouth  once daily Day 6 - Give 1 tablet by mouth once daily     SIGNIFICANT DIAGNOSTIC EXAMS  10-06-16: right knee x-ray: Mild right knee osteoarthrosis without acute osseous abnormality.  10-06-16: right foot x-ray: Degenerative changes in the great toe. No acute bony abnormality.  10-06-16: chest x-ray: Cardiomegaly without acute abnormality.  10-06-16 renal ultrasound: Small bilateral renal cysts with mild parenchymal thinning. No hydronephrosis. Bladder is distended and patient is unable to void.  10-06-16: chest x-ray: Cardiomegaly without evidence of acute cardiopulmonary disease.     LABS REVIEWED:   09-29-16: wbc 9.9; hgb 10.0; hct 29.9; mcv 85.7; plt 97; glucose 160; bun 20; creat 1.37; k+ 3.9; na++139; mag 1.6 10-02-16: wbc 8.5; hgb 10.3; hct 31.1; mcv 86.4; plt 97; k+ 3.7  10-06-16: glucose 105; bun 61.5; creat 3.87; k+ 5.7; na++ 139; ca 8.1 10-06-16 (ED): wbc 9.7; hgb 9.4; hct 41.9; mcv 84.9; plt 106; glucose 86; bun 69; creat 4.19; k+ 5.4; na++135; ca 8.0; liver normal albumin 2.8; urine culture: VRE 10-09-16: blood culture: no growth  10-10-16: wbc 8.1; hgb 9.0; hct 28.0; mcv 881.1; plt 105; glucose 105; bun 21; creat 1.62; k+ 3.4; na++ 142; ca 8.2  10-14-16: wbc 7.3; hgb 9.9; hct 29.9 ;mcv 85.2; plt 150;g lcuose 164; bun 21.4; creat 1.38; k+ 3.4 ;na++ 143; ca 7.9; uric acid 9.7  10-19-16: wbc 5.5; hgb 9.5; hct 30.5; mcv 85.9; plt 185; glucose 190; bun 19.4; creat 1.7; k+ 3.8; na++ 143 ca 8.9; ast 65; alt 92; alk phos 232;albumin 2.4  iron 29; tibc 191; ferritin 230; CEA 1.50  11-02-16: glucose 128; bun 17.8; creat 1.34; k+ 3.0; na++ 144; ca 8.5; uric acid 9.3    Review of Systems  Constitutional: Negative for malaise/fatigue.  Respiratory: Negative for cough and shortness of breath.   Cardiovascular: Negative for chest pain, palpitations and leg swelling.  Gastrointestinal: negative for abdominal pain . Negative for constipation and heartburn.       New colostomy     Musculoskeletal: Negative for back pain,myalgias.  Skin: Negative.   Neurological: Negative for dizziness.  Psychiatric/Behavioral: The patient is not nervous/anxious.     Physical Exam  Constitutional: He is oriented to person, place, and time. No distress.  Obese   Eyes: Conjunctivae are normal.  Neck: Neck supple. No JVD present. No thyromegaly present.  Cardiovascular: Normal rate, regular rhythm and intact distal pulses.   Respiratory: Effort normal and breath sounds normal. No respiratory distress. He has no wheezes.  GI: Soft. Bowel sounds are normal. He exhibits no distension. There is no tenderness.  Has colostomy present with stool in bag   Musculoskeletal: He exhibits no edema.  Able to move all extremities   Lymphadenopathy:    He has no cervical adenopathy.  Neurological: He is alert and oriented to person, place, and time.  Skin: Skin is warm and dry. He is not diaphoretic.  Psychiatric: He has a normal mood and affect.     ASSESSMENT/ PLAN:  Patient is being discharged with the following home health services:  Pt/ot/rn/cna: to evaluate and treat as indicated for gait balance strength adl training; medication management colostomy education and adl care.   Patient is being discharged with the following durable medical equipment:  Standard wheelchair with cushion leg rests anti-tippers; brake extensions. To allow patient to maintain his current level of independence with adls which cannot be achieved with a walker. He can self propel. 3:1 commode   Patient has been advised to f/u  with their PCP in 1-2 weeks to bring them up to date on their rehab stay.  Social services at facility was responsible for arranging this appointment.  Pt was provided with a 30 day supply of prescriptions for medications and refills must be obtained from their PCP.  For controlled substances, a more limited supply may be provided adequate until PCP appointment only. #30 oxycodone 5 mg  tabs   Time spent with patient 45   minutes >50% time spent counseling; reviewing medical record; tests; labs; and developing future plan of care   Ok Edwards NP [facility]  Contact [phone] Monday through Friday 8am- 5pm  After hours call [phone]

## 2016-11-24 DIAGNOSIS — R339 Retention of urine, unspecified: Secondary | ICD-10-CM | POA: Diagnosis not present

## 2016-11-25 ENCOUNTER — Telehealth: Payer: Self-pay | Admitting: Internal Medicine

## 2016-11-25 ENCOUNTER — Other Ambulatory Visit: Payer: Self-pay | Admitting: Internal Medicine

## 2016-11-25 DIAGNOSIS — I4510 Unspecified right bundle-branch block: Secondary | ICD-10-CM | POA: Diagnosis not present

## 2016-11-25 DIAGNOSIS — I5042 Chronic combined systolic (congestive) and diastolic (congestive) heart failure: Secondary | ICD-10-CM | POA: Diagnosis not present

## 2016-11-25 DIAGNOSIS — R339 Retention of urine, unspecified: Secondary | ICD-10-CM | POA: Diagnosis not present

## 2016-11-25 DIAGNOSIS — L988 Other specified disorders of the skin and subcutaneous tissue: Secondary | ICD-10-CM | POA: Diagnosis not present

## 2016-11-25 DIAGNOSIS — I272 Pulmonary hypertension, unspecified: Secondary | ICD-10-CM | POA: Diagnosis not present

## 2016-11-25 DIAGNOSIS — Z433 Encounter for attention to colostomy: Secondary | ICD-10-CM | POA: Diagnosis not present

## 2016-11-25 DIAGNOSIS — I5022 Chronic systolic (congestive) heart failure: Secondary | ICD-10-CM

## 2016-11-25 DIAGNOSIS — I4891 Unspecified atrial fibrillation: Secondary | ICD-10-CM | POA: Diagnosis not present

## 2016-11-25 DIAGNOSIS — I255 Ischemic cardiomyopathy: Secondary | ICD-10-CM | POA: Diagnosis not present

## 2016-11-25 DIAGNOSIS — I130 Hypertensive heart and chronic kidney disease with heart failure and stage 1 through stage 4 chronic kidney disease, or unspecified chronic kidney disease: Secondary | ICD-10-CM | POA: Diagnosis not present

## 2016-11-25 DIAGNOSIS — I2510 Atherosclerotic heart disease of native coronary artery without angina pectoris: Secondary | ICD-10-CM | POA: Diagnosis not present

## 2016-11-25 DIAGNOSIS — C20 Malignant neoplasm of rectum: Secondary | ICD-10-CM | POA: Diagnosis not present

## 2016-11-25 DIAGNOSIS — D631 Anemia in chronic kidney disease: Secondary | ICD-10-CM | POA: Diagnosis not present

## 2016-11-25 DIAGNOSIS — N183 Chronic kidney disease, stage 3 (moderate): Secondary | ICD-10-CM | POA: Diagnosis not present

## 2016-11-25 NOTE — Telephone Encounter (Signed)
Okay to give verbal orders?   Thanks!   

## 2016-11-25 NOTE — Telephone Encounter (Signed)
Verbal ok given to [facility] and she states that patient is on his way to urology because his foley cath is leaking and there is blood in the bag.  Will wait and see what urology has to say about this.  Will forward to MD to make aware. Lycan Davee,CMA

## 2016-11-25 NOTE — Telephone Encounter (Signed)
Will forward to MD. Jazmin Hartsell,CMA  

## 2016-11-25 NOTE — Telephone Encounter (Signed)
Pt is driving and shouldn't be. Pam wants verbal orders to continue services, one week one and twice a week for eight weeks. Pam states pt also needs an aid with at least 3 hours a day. Pam also wants verbal orders for wet to dry packing for wound on scrotum. ep

## 2016-11-25 NOTE — Telephone Encounter (Signed)
Thank you :)

## 2016-11-27 ENCOUNTER — Telehealth: Payer: Self-pay | Admitting: Hematology

## 2016-11-27 NOTE — Telephone Encounter (Signed)
S/w pt, advised appt 7/12 moved to 7/26 @ 230pm.

## 2016-11-28 ENCOUNTER — Other Ambulatory Visit: Payer: Self-pay

## 2016-11-28 NOTE — Patient Outreach (Addendum)
[facility]) Care Management  11/28/2016  Patrick [name] 1951/04/18 [id number]     Telephone Screen  Referral Date: 11/25/16 Referral Source:MD office (Dr. Liliane Shi bliss) Referral Reason: " HF, Renal failure, medication and symptom management, need SW" Insurance: [facility] Medicare   Outreach attempt # 1  to patient. Spoke with patient and screening completed.   Social: Patient resides in his home and states he is "temporarily" living alone. He has concern regarding living alone but has no caregiver support. He states that he is the sole caregiver for his sister(Patrick [name]). Patient voices that his sister is currently a resident at [facility] for rehab as he is not abel to care for her at present. patient reports that he is performing his ADLs/IADLS "slowly but independently." He voices that prior to recent hospitalization he was driving. He is unsure of rather or not he should return to driving and has not consulted with MD regarding the matter. Patient aware of transportation benefit through Medicare and Medicaid and has info to call  And scheduled pickup if needed for transportation. He denies any recent falls. DME in the home include cane and shower chair. He voices that walker was recommended das he is "unsteady" on his feet. However, patient states that his home is less than 400 square feet and walker would not work. [place] services(PT/OT,RN, CNA) ordered for patient at discharge. He is unsure of agency name but reports someone came out over the weekend to see him.   Conditions: Per records patient has PMH of arthritis, CAD,. CHF, HLD, HTN, prediabetes, rectal adenocarcinoma s/p colostomy along with chemo/radiation treatments and severe gout. Patient was hospitalized  10/06/16 to 10/10/16 for right knee pain and ARF. At time of discharge, he was sent to [facility] for rehab. patient states that his stay there was only supposed to be two weeks. However, due to complications and issues he ended up  staying almost two months. Patient has new colostomy of which he states he has not changed and managed the care on his own yet and facility staff was doing it for him. Patient voices some concerns about needing assistance managing all of his chronic conditions.   Medications: patient voices taking about ten meds. He denies any issues with affording or managing meds. States he fills med planner himself. [facility] pharmacy referral already pending.    Appointments: Patient unsure of MD appts. He is followed by PCP along with cardiologist, urologist and oncologist.    Advance Directives: patient reported none and was not interested in completing at this time.   Consent: [facility] services reviewed and discussed. Patient gave verbal consent for services.    Plan: RN CM will notify [facility] administrative assistant of case status. RN CM will send referral to [facility] RN for further in home eval/assessment of care needs and management of chronic conditions. RN CM will send [facility] SW referral for further in home safety eval, lack of caregiver support and high depression screen score.   Enzo Montgomery, RN,BSN,CCM [facility] Telephonic Care Management Coordinator Direct Phone: [phone] Toll Free: [phone] Fax: [phone]

## 2016-11-30 DIAGNOSIS — D631 Anemia in chronic kidney disease: Secondary | ICD-10-CM | POA: Diagnosis not present

## 2016-11-30 DIAGNOSIS — I255 Ischemic cardiomyopathy: Secondary | ICD-10-CM | POA: Diagnosis not present

## 2016-11-30 DIAGNOSIS — C20 Malignant neoplasm of rectum: Secondary | ICD-10-CM | POA: Diagnosis not present

## 2016-11-30 DIAGNOSIS — I272 Pulmonary hypertension, unspecified: Secondary | ICD-10-CM | POA: Diagnosis not present

## 2016-11-30 DIAGNOSIS — N183 Chronic kidney disease, stage 3 (moderate): Secondary | ICD-10-CM | POA: Diagnosis not present

## 2016-11-30 DIAGNOSIS — I4510 Unspecified right bundle-branch block: Secondary | ICD-10-CM | POA: Diagnosis not present

## 2016-11-30 DIAGNOSIS — I4891 Unspecified atrial fibrillation: Secondary | ICD-10-CM | POA: Diagnosis not present

## 2016-11-30 DIAGNOSIS — I2510 Atherosclerotic heart disease of native coronary artery without angina pectoris: Secondary | ICD-10-CM | POA: Diagnosis not present

## 2016-11-30 DIAGNOSIS — I130 Hypertensive heart and chronic kidney disease with heart failure and stage 1 through stage 4 chronic kidney disease, or unspecified chronic kidney disease: Secondary | ICD-10-CM | POA: Diagnosis not present

## 2016-11-30 DIAGNOSIS — I5042 Chronic combined systolic (congestive) and diastolic (congestive) heart failure: Secondary | ICD-10-CM | POA: Diagnosis not present

## 2016-11-30 DIAGNOSIS — L988 Other specified disorders of the skin and subcutaneous tissue: Secondary | ICD-10-CM | POA: Diagnosis not present

## 2016-11-30 DIAGNOSIS — Z433 Encounter for attention to colostomy: Secondary | ICD-10-CM | POA: Diagnosis not present

## 2016-12-01 ENCOUNTER — Other Ambulatory Visit: Payer: Self-pay | Admitting: *Deleted

## 2016-12-01 DIAGNOSIS — Z433 Encounter for attention to colostomy: Secondary | ICD-10-CM | POA: Diagnosis not present

## 2016-12-01 DIAGNOSIS — D631 Anemia in chronic kidney disease: Secondary | ICD-10-CM | POA: Diagnosis not present

## 2016-12-01 DIAGNOSIS — I4891 Unspecified atrial fibrillation: Secondary | ICD-10-CM | POA: Diagnosis not present

## 2016-12-01 DIAGNOSIS — L988 Other specified disorders of the skin and subcutaneous tissue: Secondary | ICD-10-CM | POA: Diagnosis not present

## 2016-12-01 DIAGNOSIS — I2510 Atherosclerotic heart disease of native coronary artery without angina pectoris: Secondary | ICD-10-CM | POA: Diagnosis not present

## 2016-12-01 DIAGNOSIS — N183 Chronic kidney disease, stage 3 (moderate): Secondary | ICD-10-CM | POA: Diagnosis not present

## 2016-12-01 DIAGNOSIS — C20 Malignant neoplasm of rectum: Secondary | ICD-10-CM | POA: Diagnosis not present

## 2016-12-01 DIAGNOSIS — I255 Ischemic cardiomyopathy: Secondary | ICD-10-CM | POA: Diagnosis not present

## 2016-12-01 DIAGNOSIS — I4510 Unspecified right bundle-branch block: Secondary | ICD-10-CM | POA: Diagnosis not present

## 2016-12-01 DIAGNOSIS — I272 Pulmonary hypertension, unspecified: Secondary | ICD-10-CM | POA: Diagnosis not present

## 2016-12-01 DIAGNOSIS — I130 Hypertensive heart and chronic kidney disease with heart failure and stage 1 through stage 4 chronic kidney disease, or unspecified chronic kidney disease: Secondary | ICD-10-CM | POA: Diagnosis not present

## 2016-12-01 DIAGNOSIS — I5042 Chronic combined systolic (congestive) and diastolic (congestive) heart failure: Secondary | ICD-10-CM | POA: Diagnosis not present

## 2016-12-01 NOTE — Patient Outreach (Signed)
[facility]) Care Management  12/01/2016  NIC LAMPE 1951-05-01 [id number]   CSW was able to make contact with patient today, per In Basket message received in EPIC, with regards to patient needing assistance in the home.  Patient reports that he is doing much better than he was, that the two episodes of Gout, "threw him for a loop". Patient indicated that he is now able to ambulate with a cane and perform all activities of daily living independently.  Patient is currently receiving home health services through [facility], which consists of a home health nurse and physical therapist. Patient already has a wheelchair ramp installed on the front of his rental property, to aid with ambulation. Patient denies having any social work needs at present, but has CSW's contact information and agreed to contact CSW directly if any social work needs arise in the near future. CSW will perform a case closure on patient, as all goals of treatment have been met from social work standpoint and no additional social work needs have been identified at this time. CSW will fax an update to patient's Primary Care Physician, Dr. Hyman Bible to ensure that they are aware of CSW's involvement with patient's plan of care.  CSW will submit a case closure request to Verlon Setting, Care Management Assistant with [facility], in the form of an [employer].  [name], BSW, MSW, LCSW  Licensed Education officer, [facility]  Mailing [place] [address] [place], [place] [address] Physical Address-[address], [place],  [address] Toll Free Main # [phone] Fax # [phone] Cell # [phone]  Office # [phone] [email or web address]

## 2016-12-02 ENCOUNTER — Other Ambulatory Visit: Payer: Self-pay

## 2016-12-02 NOTE — Patient Outreach (Signed)
[facility]) Care Management  12/02/16  Patrick [name] [birth year] [id number]  New referral received from telephonic for recent discharge from [facility] (SNF level of care) on 11/23/2016. Patient with past medical history of HTN, CHF, CAD, arthritis, rectal adenocarcinoma, s/p colostomy, gout. Patient was in hospital 5/2 - 10/02/2016 for adenocarcinoma of rectum s/p robotic APR/colostomy on 09/28/2016. He was discharged from hospital to SNF, but returned to hospital 5/10-5/14/2018 for acute renal failure. He was discharged back to SNF and then from SNF ([facility]) to home on 6/272018.  Successful outreach completed with patient. Patient identification verified.  Patient stated that he had been home for about a week and that the first 1-3 days was really rough. He stated that he was not steady on his feet at all and was having some difficulty with mobility. He stated that he is doing better now and can walk without his cane in the home "carefully." RNCM provided education regarding signs and symptoms to watch for when changing colostomy pouch that he should report to MD such as irritation, bruises and rashes. Patient stated that he has home health through [facility] for colostomy supplies. He has therapy and a nurse.he is dependent for all of her adls and meals. He is currently not able to lift and is not well enough yet and this concerns him.  Patient stated that he has all of his medicines and does not have any questions or concerns at present.  Patient stated that he is driving himself to his appointments.   Plan: RNCM to continue to follow patient weekly for [facility] and patient is agreeable. Also home visit scheduled for 2 weeks to complete remainder of initial assessment. RNCM advised will be out of the office next week, but have coverage. Encouraged to call the office or 24 hour nurse line if he has any medical questions or concerns.   [place] R. Tawsha Terrero, RN, BSN, [place]  Management Coordinator [phone]

## 2016-12-05 NOTE — Telephone Encounter (Signed)
Pam from [facility] is calling because she said that the patient has a wound around his scrotum area. She thinks that this might be from his surgery, but not sure. She would like the doctor to send her notes and orders from the patient's visit on Friday so that they know what they need to be doing. Please fax orders to [phone] or call [phone] with questions.jw

## 2016-12-05 NOTE — Telephone Encounter (Signed)
Spoke with Jeannene Patella and informed her that patient hasn't been seen in the office since April.  He does have an upcoming appointment on Friday 12-09-16 with PCP.  Will forward to her so she is aware that we need to send notes over to home health agency on care. Ladarrious Kirksey,CMA

## 2016-12-06 ENCOUNTER — Other Ambulatory Visit: Payer: Self-pay

## 2016-12-06 DIAGNOSIS — D631 Anemia in chronic kidney disease: Secondary | ICD-10-CM | POA: Diagnosis not present

## 2016-12-06 DIAGNOSIS — Z433 Encounter for attention to colostomy: Secondary | ICD-10-CM | POA: Diagnosis not present

## 2016-12-06 DIAGNOSIS — I5042 Chronic combined systolic (congestive) and diastolic (congestive) heart failure: Secondary | ICD-10-CM | POA: Diagnosis not present

## 2016-12-06 DIAGNOSIS — I272 Pulmonary hypertension, unspecified: Secondary | ICD-10-CM | POA: Diagnosis not present

## 2016-12-06 DIAGNOSIS — N183 Chronic kidney disease, stage 3 (moderate): Secondary | ICD-10-CM | POA: Diagnosis not present

## 2016-12-06 DIAGNOSIS — C20 Malignant neoplasm of rectum: Secondary | ICD-10-CM | POA: Diagnosis not present

## 2016-12-06 DIAGNOSIS — L988 Other specified disorders of the skin and subcutaneous tissue: Secondary | ICD-10-CM | POA: Diagnosis not present

## 2016-12-06 DIAGNOSIS — I2510 Atherosclerotic heart disease of native coronary artery without angina pectoris: Secondary | ICD-10-CM | POA: Diagnosis not present

## 2016-12-06 DIAGNOSIS — I130 Hypertensive heart and chronic kidney disease with heart failure and stage 1 through stage 4 chronic kidney disease, or unspecified chronic kidney disease: Secondary | ICD-10-CM | POA: Diagnosis not present

## 2016-12-06 DIAGNOSIS — I4891 Unspecified atrial fibrillation: Secondary | ICD-10-CM | POA: Diagnosis not present

## 2016-12-06 DIAGNOSIS — I255 Ischemic cardiomyopathy: Secondary | ICD-10-CM | POA: Diagnosis not present

## 2016-12-06 DIAGNOSIS — I4510 Unspecified right bundle-branch block: Secondary | ICD-10-CM | POA: Diagnosis not present

## 2016-12-06 NOTE — Telephone Encounter (Signed)
Lattie Haw with [facility] is calling for wound care instructions. She is requesting aquacel ag equiv skin prep cover with border foam and do twice a week and as needed until healed. Verbal approval has been given, please advise if this should change. If questions, please call her at [phone]. Ottis Stain, CMA

## 2016-12-06 NOTE — Patient Outreach (Signed)
Patrick [name]) Care Management  12/06/16  Patrick [name] 07-26-1950 [id number]  Successful outreach completed with patient briefly due to his home health nurse being there from [facility] [place]. Patient stated that everything was going well and that his home health staff was taking good care of him. He denies any needs or concerns at present.  Plan: Home visit scheduled for next week and patient in agreement. Patient encouraged to call with any questions or needs prior to appointment and he verbalized understanding.  [place] R. Angelette Ganus, RN, BSN, [place] Management Coordinator [phone]

## 2016-12-06 NOTE — Telephone Encounter (Signed)
Contacted Lattie Haw and informed her of pcp agreement with plan.

## 2016-12-06 NOTE — Telephone Encounter (Signed)
Thank you. Agree with verbal order.

## 2016-12-08 ENCOUNTER — Ambulatory Visit: Payer: Medicare Other | Admitting: Hematology

## 2016-12-08 ENCOUNTER — Other Ambulatory Visit: Payer: Self-pay | Admitting: Pharmacist

## 2016-12-08 ENCOUNTER — Other Ambulatory Visit: Payer: Medicare Other

## 2016-12-08 DIAGNOSIS — R339 Retention of urine, unspecified: Secondary | ICD-10-CM | POA: Diagnosis not present

## 2016-12-08 DIAGNOSIS — I4510 Unspecified right bundle-branch block: Secondary | ICD-10-CM | POA: Diagnosis not present

## 2016-12-08 DIAGNOSIS — D631 Anemia in chronic kidney disease: Secondary | ICD-10-CM | POA: Diagnosis not present

## 2016-12-08 DIAGNOSIS — I130 Hypertensive heart and chronic kidney disease with heart failure and stage 1 through stage 4 chronic kidney disease, or unspecified chronic kidney disease: Secondary | ICD-10-CM | POA: Diagnosis not present

## 2016-12-08 DIAGNOSIS — I4891 Unspecified atrial fibrillation: Secondary | ICD-10-CM | POA: Diagnosis not present

## 2016-12-08 DIAGNOSIS — I272 Pulmonary hypertension, unspecified: Secondary | ICD-10-CM | POA: Diagnosis not present

## 2016-12-08 DIAGNOSIS — N183 Chronic kidney disease, stage 3 (moderate): Secondary | ICD-10-CM | POA: Diagnosis not present

## 2016-12-08 DIAGNOSIS — I255 Ischemic cardiomyopathy: Secondary | ICD-10-CM | POA: Diagnosis not present

## 2016-12-08 DIAGNOSIS — C20 Malignant neoplasm of rectum: Secondary | ICD-10-CM | POA: Diagnosis not present

## 2016-12-08 DIAGNOSIS — I2510 Atherosclerotic heart disease of native coronary artery without angina pectoris: Secondary | ICD-10-CM | POA: Diagnosis not present

## 2016-12-08 DIAGNOSIS — L988 Other specified disorders of the skin and subcutaneous tissue: Secondary | ICD-10-CM | POA: Diagnosis not present

## 2016-12-08 DIAGNOSIS — Z433 Encounter for attention to colostomy: Secondary | ICD-10-CM | POA: Diagnosis not present

## 2016-12-08 DIAGNOSIS — I5042 Chronic combined systolic (congestive) and diastolic (congestive) heart failure: Secondary | ICD-10-CM | POA: Diagnosis not present

## 2016-12-08 NOTE — Patient Outreach (Signed)
[facility]) Care Management  [place]   12/08/2016  Patrick [name] 21-May-1951 [id number]  Subjective:  Patient referred to [facility] for medication management.   Successful phone outreach to patient, HIPAA details verified.    Patient reports he had hospital and SNF stays recently.   Patient reports he uses a weekly pill box.  He denies difficulty filling his pill box.  He denies medication affordability issues.    Patient reports he may miss 1-2 doses of medication per month.    Patient was willing to review his medications and thought he could do it over the phone versus a home visit at this time.    His past medical history is significant for hypertension, systolic heart failure, adenocarcinoma of the rectum, BPH, chronic kidney disease, gout.    Objective:   Current Medications: Current Outpatient Prescriptions  Medication Sig Dispense Refill  . allopurinol (ZYLOPRIM) 300 MG tablet Take 300 mg by mouth daily.     . Ascorbic Acid (VITAMIN C) 1000 MG tablet Take 2,000 mg by mouth 2 (two) times daily.    . carvedilol (COREG) 25 MG tablet Take 1 tablet (25 mg total) by mouth 2 (two) times daily. 180 tablet 3  . colchicine 0.6 MG tablet Take 0.6 mg by mouth 2 (two) times daily as needed.    . ferrous sulfate 325 (65 FE) MG tablet Take 325 mg by mouth 2 (two) times daily with a meal.    . finasteride (PROSCAR) 5 MG tablet take 1 tablet by mouth once daily 90 tablet 1  . lovastatin (MEVACOR) 20 MG tablet take 1 tablet by mouth at bedtime 90 tablet 1  . Multiple Vitamins-Minerals (MULTIVITAMIN WITH MINERALS) tablet Take 1 tablet by mouth daily. Men's 50+ Multivitamin    . Omega-3 1000 MG CAPS Take 2,000 mg by mouth 2 (two) times daily.     Marland Kitchen oxyCODONE-acetaminophen (PERCOCET/ROXICET) 5-325 MG tablet Take 1 tablet by mouth every 6 (six) hours as needed for severe pain.    . potassium chloride SA (K-DUR,KLOR-CON) 20 MEQ tablet Take 60 mEq by mouth every  morning. And 20 meq by mouth in the evening    . tamsulosin (FLOMAX) 0.4 MG CAPS capsule Take 0.4 mg by mouth daily.     Marland Kitchen torsemide (DEMADEX) 20 MG tablet Take 40 mg by mouth daily after supper.     . traZODone (DESYREL) 100 MG tablet Take 300 mg by mouth at bedtime.   0  . acetaminophen (TYLENOL) 500 MG tablet Take 1,000 mg by mouth every 6 (six) hours as needed (for pain/fever/headaches.).     Marland Kitchen aspirin EC 81 MG EC tablet Take 1 tablet (81 mg total) by mouth daily. (Patient not taking: Reported on 12/08/2016) 30 tablet 0  . ipratropium-albuterol (DUONEB) 0.5-2.5 (3) MG/3ML SOLN Take 3 mLs by nebulization every 4 (four) hours as needed. (Patient not taking: Reported on 12/08/2016) 360 mL 0  . Lactobacillus (ACIDOPHOLUS PO) Give 1 Capsule by mouth two times a day for prophylaxis for 3 weeks    . Menthol, Topical Analgesic, (BIOFREEZE ROLL-ON) 4 % GEL Apply to bilateral knees topically every 12 hours as needed for pain    . methocarbamol (ROBAXIN) 750 MG tablet Take 1 tablet (750 mg total) by mouth 4 (four) times daily as needed (use for muscle cramps/pain). (Patient not taking: Reported on 12/08/2016) 30 tablet 2  . mirtazapine (REMERON) 30 MG tablet Take 1 tablet (30 mg total) by mouth  at bedtime. (Patient not taking: Reported on 12/08/2016) 30 tablet 1  . nitroGLYCERIN (NITROSTAT) 0.4 MG SL tablet Place 1 tablet (0.4 mg total) under the tongue every 5 (five) minutes as needed for chest pain. (Patient not taking: Reported on 12/08/2016) 25 tablet 3  . pantoprazole (PROTONIX) 40 MG tablet Take 1 tablet (40 mg total) by mouth daily. (Patient not taking: Reported on 12/08/2016) 30 tablet 0   No current facility-administered medications for this visit.     Functional Status: In your present state of health, do you have any difficulty performing the following activities: 10/06/2016 10/06/2016  Hearing? N N  Vision? N N  Difficulty concentrating or making decisions? N Y  Walking or climbing stairs? Y Y   Dressing or bathing? N Y  Doing errands, shopping? N Y  Conservation officer, nature and eating ? - Y  Using the Toilet? - Y  In the past six months, have you accidently leaked urine? - N  Do you have problems with loss of bowel control? - Y  Managing your Medications? - N  Managing your Finances? - N  Housekeeping or managing your Housekeeping? - Y  Some recent data might be hidden    Fall/Depression Screening: Fall Risk  10/06/2016 09/02/2016 08/04/2016  Falls in the past year? No No No  Number falls in past yr: - - -  Injury with Fall? - - -  Risk Factor Category  - - -  Risk for fall due to : - - -  Follow up - - -   PHQ 2/9 Scores 11/28/2016 10/06/2016 09/02/2016 08/04/2016 06/21/2016 06/10/2016 01/25/2016  PHQ - 2 Score 5 0 0 1 6 6  0  PHQ- 9 Score 11 - - - 12 16 -    Assessment:  Medications per patient report and medication list in this chart.   Drugs sorted by system:  Neurologic/Psychologic: -mirtazapine---patient reports using 15 mg sublingual tabs still  -trazodone   Cardiovascular: -aspirin---patient reports not taking  -carvedilol -lovastatin  -sublingual nitroglycerin  -omega 3 fatty acid -potassium chloride -torsemide  Pulmonary/Allergy: -ipratropium/albuterol nebs---patient reports does not have and does not have nebulizer per his report   Gastrointestinal: -probiotic---patient denies using  -pantoprazole---patient denies using  Endocrine: -allopuriol  -colchicine---as needed   Pain: -acetaminophen as needed -methocarbamol---patient reports he has not used -oxycodone/acetaminophen as needed per patient report   Vitamins/Minerals: -vitamin C -ferrous sulfate  -multivitamin  Miscellaneous: -finasteride -tamsulosin   Medications to avoid in the elderly:  -methocarbamol---patient reports has not used yet---increased risk of sedation/dizziness/falls in >33 year old population   Drug interactions:  Lovastatin and colchicine---increased risk of statin related  ADRs with concomitant use---suggest monitoring patient for  signs/symptoms of lovastatin related ADRs.    Other issues noted:   1) Mirtazapine:  Patient reports taking 15 mg sublingual, medication list reflects 30 mg daily---appears dose change by Dr Burr Medico per 10/19/16 note---patient needs dose clarification.  2) Pain medication:  Patient reports using oxycodone/acetaminophen 5/325 mg---he did not mention oxycodone which was in 11/23/16 progress note from Lorin Glass, NP---recommend clarifying pain management regimen and ensure patient is not taking both medications due to increased risk of ADRs.    3) Patient reports he is not taking the following medications:  -aspirin -pantoprazole -probiotic -ipratropium/albuterol nebs---he also states he does not have nebulizer  Please clarify if patient is to take any of these medications.   4) Per 10/10/16 discharge summary, losartan was held on discharge due to acute kidney injury.  Plan:  1) Will route this note to PCP.   2) Will follow-up via phone with patient in next 2-3 weeks per his request.  He denies medication related concerns at this time.   Karrie Meres, PharmD, [place] [phone]

## 2016-12-09 ENCOUNTER — Encounter: Payer: Self-pay | Admitting: Internal Medicine

## 2016-12-09 ENCOUNTER — Ambulatory Visit (INDEPENDENT_AMBULATORY_CARE_PROVIDER_SITE_OTHER): Payer: Medicare Other | Admitting: Internal Medicine

## 2016-12-09 VITALS — BP 126/68 | HR 82 | Temp 97.9°F | Ht 69.0 in | Wt 189.0 lb

## 2016-12-09 DIAGNOSIS — Z0000 Encounter for general adult medical examination without abnormal findings: Secondary | ICD-10-CM | POA: Diagnosis not present

## 2016-12-09 DIAGNOSIS — F331 Major depressive disorder, recurrent, moderate: Secondary | ICD-10-CM

## 2016-12-09 DIAGNOSIS — Z23 Encounter for immunization: Secondary | ICD-10-CM

## 2016-12-09 DIAGNOSIS — I10 Essential (primary) hypertension: Secondary | ICD-10-CM

## 2016-12-09 MED ORDER — LOSARTAN POTASSIUM 50 MG PO TABS: 50.0000 mg | ORAL_TABLET | Freq: Every day | ORAL | 3 refills | Status: DC

## 2016-12-09 MED ORDER — OMEGA-3 1000 MG PO CAPS
2000.0000 mg | ORAL_CAPSULE | Freq: Two times a day (BID) | ORAL | 1 refills | Status: DC
Start: 2016-12-09 — End: 2017-10-27

## 2016-12-09 NOTE — Patient Instructions (Addendum)
It was wonderful to see you!   We will restart your Losartan today. We will check your kidney function today and then again in 1 week.   I have also sent in a referral to the eye doctor. Our office will call you in ~2 weeks to schedule this appointment.  I sent in a prescription for the Shingles vaccine to your pharmacy.  We will see you back in 3 months!  -Dr. Brett Albino

## 2016-12-09 NOTE — Progress Notes (Signed)
   [facility] Phone: [phone]  Subjective:  Patrick [name] is a 66 year old male presenting to clinic for follow-up of his hypertension and depression.  HTN: His losartan was held on discharge from the hospital due to AKI. He has not taken it since then. He denies any chest pain, SOB, or lower extremity edema. Not checking blood pressures at home.  Depression: States he has had a difficult time dealing with all of his medical conditions recently. He states he still cannot believe that he has a colostomy bag and has had to keep his urinary catheter in. He is hoping that he starts to feel better now that he is back at home. Last PHQ-9 performed by [facility] social worker was 79. Sleeping like normal. No SI/HI. Taking Remeron, which doesn't seem to be helping.  ROS: See HPI for pertinent positives and negatives  Past Medical History- HTN, systolic HF, CAD, A-fib, pulmonary HTN, adenocarcinoma of the rectum, BPH, CKD III, macular degeneration, MDD  Family history reviewed for today's visit. No changes.  Social history- patient is a former smoker.  Objective: BP 126/68   Pulse 82   Temp 97.9 F (36.6 C) (Oral)   Ht 5\' 9"  (1.753 m)   Wt 189 lb (85.7 kg)   SpO2 98%   BMI 27.91 kg/m  Gen: NAD, alert, cooperative with exam HEENT: NCAT, EOMI, MMM Neck: FROM, supple CV: RRR, no murmur Resp: CTABL, no wheezes, normal work of breathing Psych: Normal rate of speech, appropriate affect, able to smile during visit, appropriate behavior  PHQ-9: 13  Assessment/Plan: HTN: Well-controlled. BP 126/88 today. - Check BMET today - Restart Losartan due to patient's systolic CHF - Recheck BMET again in 1 week. Future order placed  Depression: Uncontrolled. Last PHQ-9 was 16, repeat PHQ-9 today was 13. - Increase Remeron dose from 15mg  qhs to 30mg  qhs (this was increased by patient's oncologist in 09/2016, but he didn't realize he was supposed to increase the dose) - Continue  Trazodone 300mg  qhs - Follow-up in 4-6 weeks. Will repeat PHQ-9 at that time and may need to add additional medication.  Health Care Maintenance: - Rx sent to pharmacy for Zoster vaccine - Referral to ophthalmology for annual eye exam   Hyman Bible, MD PGY-3

## 2016-12-10 LAB — BASIC METABOLIC PANEL
BUN / CREAT RATIO: 14 (ref 10–24)
BUN: 19 mg/dL (ref 8–27)
CO2: 24 mmol/L (ref 20–29)
CREATININE: 1.37 mg/dL — AB (ref 0.76–1.27)
Calcium: 8.6 mg/dL (ref 8.6–10.2)
Chloride: 106 mmol/L (ref 96–106)
GFR calc Af Amer: 61 mL/min/{1.73_m2} (ref >59)
GFR, EST NON AFRICAN AMERICAN: 53 mL/min/{1.73_m2} — AB (ref >59)
GLUCOSE: 102 mg/dL — AB (ref 65–99)
Potassium: 4.7 mmol/L (ref 3.5–5.2)
SODIUM: 144 mmol/L (ref 134–144)

## 2016-12-11 NOTE — Assessment & Plan Note (Signed)
Well-controlled. BP 126/88 today. - Check BMET today - Restart Losartan due to patient's systolic CHF - Recheck BMET again in 1 week. Future order placed

## 2016-12-11 NOTE — Assessment & Plan Note (Signed)
-   Rx sent to pharmacy for Zoster vaccine - Referral to ophthalmology for annual eye exam

## 2016-12-11 NOTE — Assessment & Plan Note (Signed)
Uncontrolled. Last PHQ-9 was 16, repeat PHQ-9 today was 13. - Increase Remeron dose from 15mg  qhs to 30mg  qhs (this was increased by patient's oncologist in 09/2016, but he didn't realize he was supposed to increase the dose) - Follow-up in 4-6 weeks. Will repeat PHQ-9 at that time and may need to add additional medication.

## 2016-12-12 ENCOUNTER — Telehealth: Payer: Self-pay

## 2016-12-12 DIAGNOSIS — I4510 Unspecified right bundle-branch block: Secondary | ICD-10-CM | POA: Diagnosis not present

## 2016-12-12 DIAGNOSIS — D631 Anemia in chronic kidney disease: Secondary | ICD-10-CM | POA: Diagnosis not present

## 2016-12-12 DIAGNOSIS — I255 Ischemic cardiomyopathy: Secondary | ICD-10-CM | POA: Diagnosis not present

## 2016-12-12 DIAGNOSIS — N183 Chronic kidney disease, stage 3 (moderate): Secondary | ICD-10-CM | POA: Diagnosis not present

## 2016-12-12 DIAGNOSIS — L988 Other specified disorders of the skin and subcutaneous tissue: Secondary | ICD-10-CM | POA: Diagnosis not present

## 2016-12-12 DIAGNOSIS — I5042 Chronic combined systolic (congestive) and diastolic (congestive) heart failure: Secondary | ICD-10-CM | POA: Diagnosis not present

## 2016-12-12 DIAGNOSIS — I4891 Unspecified atrial fibrillation: Secondary | ICD-10-CM | POA: Diagnosis not present

## 2016-12-12 DIAGNOSIS — I272 Pulmonary hypertension, unspecified: Secondary | ICD-10-CM | POA: Diagnosis not present

## 2016-12-12 DIAGNOSIS — I130 Hypertensive heart and chronic kidney disease with heart failure and stage 1 through stage 4 chronic kidney disease, or unspecified chronic kidney disease: Secondary | ICD-10-CM | POA: Diagnosis not present

## 2016-12-12 DIAGNOSIS — Z433 Encounter for attention to colostomy: Secondary | ICD-10-CM | POA: Diagnosis not present

## 2016-12-12 DIAGNOSIS — C20 Malignant neoplasm of rectum: Secondary | ICD-10-CM | POA: Diagnosis not present

## 2016-12-12 DIAGNOSIS — I2510 Atherosclerotic heart disease of native coronary artery without angina pectoris: Secondary | ICD-10-CM | POA: Diagnosis not present

## 2016-12-12 NOTE — Telephone Encounter (Signed)
-----   Message from Sela Hua, MD sent at 12/12/2016  2:25 PM EDT ----- Please let Mr. Beddow know that his labs looked good from our visit. He should still plan to come back in 1 week for repeat labs. Thank you!

## 2016-12-12 NOTE — Telephone Encounter (Signed)
Pt contacted and informed of normal lab results and pcp plan to come back Friday. Pt has a lab visit scheduled for Friday which he plans to keep.

## 2016-12-13 DIAGNOSIS — I272 Pulmonary hypertension, unspecified: Secondary | ICD-10-CM | POA: Diagnosis not present

## 2016-12-13 DIAGNOSIS — I4891 Unspecified atrial fibrillation: Secondary | ICD-10-CM | POA: Diagnosis not present

## 2016-12-13 DIAGNOSIS — D631 Anemia in chronic kidney disease: Secondary | ICD-10-CM | POA: Diagnosis not present

## 2016-12-13 DIAGNOSIS — I4510 Unspecified right bundle-branch block: Secondary | ICD-10-CM | POA: Diagnosis not present

## 2016-12-13 DIAGNOSIS — Z433 Encounter for attention to colostomy: Secondary | ICD-10-CM | POA: Diagnosis not present

## 2016-12-13 DIAGNOSIS — I5042 Chronic combined systolic (congestive) and diastolic (congestive) heart failure: Secondary | ICD-10-CM | POA: Diagnosis not present

## 2016-12-13 DIAGNOSIS — I130 Hypertensive heart and chronic kidney disease with heart failure and stage 1 through stage 4 chronic kidney disease, or unspecified chronic kidney disease: Secondary | ICD-10-CM | POA: Diagnosis not present

## 2016-12-13 DIAGNOSIS — C20 Malignant neoplasm of rectum: Secondary | ICD-10-CM | POA: Diagnosis not present

## 2016-12-13 DIAGNOSIS — I255 Ischemic cardiomyopathy: Secondary | ICD-10-CM | POA: Diagnosis not present

## 2016-12-13 DIAGNOSIS — I2510 Atherosclerotic heart disease of native coronary artery without angina pectoris: Secondary | ICD-10-CM | POA: Diagnosis not present

## 2016-12-13 DIAGNOSIS — L988 Other specified disorders of the skin and subcutaneous tissue: Secondary | ICD-10-CM | POA: Diagnosis not present

## 2016-12-13 DIAGNOSIS — N183 Chronic kidney disease, stage 3 (moderate): Secondary | ICD-10-CM | POA: Diagnosis not present

## 2016-12-15 ENCOUNTER — Other Ambulatory Visit: Payer: Self-pay

## 2016-12-15 DIAGNOSIS — C20 Malignant neoplasm of rectum: Secondary | ICD-10-CM | POA: Diagnosis not present

## 2016-12-15 DIAGNOSIS — I2510 Atherosclerotic heart disease of native coronary artery without angina pectoris: Secondary | ICD-10-CM | POA: Diagnosis not present

## 2016-12-15 DIAGNOSIS — N183 Chronic kidney disease, stage 3 (moderate): Secondary | ICD-10-CM | POA: Diagnosis not present

## 2016-12-15 DIAGNOSIS — I272 Pulmonary hypertension, unspecified: Secondary | ICD-10-CM | POA: Diagnosis not present

## 2016-12-15 DIAGNOSIS — I5042 Chronic combined systolic (congestive) and diastolic (congestive) heart failure: Secondary | ICD-10-CM | POA: Diagnosis not present

## 2016-12-15 DIAGNOSIS — I4891 Unspecified atrial fibrillation: Secondary | ICD-10-CM | POA: Diagnosis not present

## 2016-12-15 DIAGNOSIS — I255 Ischemic cardiomyopathy: Secondary | ICD-10-CM | POA: Diagnosis not present

## 2016-12-15 DIAGNOSIS — I4510 Unspecified right bundle-branch block: Secondary | ICD-10-CM | POA: Diagnosis not present

## 2016-12-15 DIAGNOSIS — I130 Hypertensive heart and chronic kidney disease with heart failure and stage 1 through stage 4 chronic kidney disease, or unspecified chronic kidney disease: Secondary | ICD-10-CM | POA: Diagnosis not present

## 2016-12-15 DIAGNOSIS — Z433 Encounter for attention to colostomy: Secondary | ICD-10-CM | POA: Diagnosis not present

## 2016-12-15 DIAGNOSIS — L988 Other specified disorders of the skin and subcutaneous tissue: Secondary | ICD-10-CM | POA: Diagnosis not present

## 2016-12-15 DIAGNOSIS — D631 Anemia in chronic kidney disease: Secondary | ICD-10-CM | POA: Diagnosis not present

## 2016-12-15 NOTE — Patient Outreach (Signed)
[facility]) Care Management   12/15/2016  Patrick [name] 05-06-1951 [id number]   Home visit completed with patient. Patient's sister currently remains at [facility].   Patrick [name] is an 66 y.o. male  Subjective: Patient stated he is feeling better. Stated he is getting around much better and feels stronger.  Objective:    Review of Systems  Gastrointestinal:       Wounds to rectal area    Physical Exam  Constitutional: He is oriented to person, place, and time.  Cardiovascular: Normal rate, regular rhythm and normal heart sounds.   Respiratory: Effort normal and breath sounds normal.  GI: Soft. Bowel sounds are normal.  Colostomy pouch attached with no leaks   Genitourinary:  Genitourinary Comments: Foley catheter  Neurological: He is alert and oriented to person, place, and time.  Skin: Skin is warm and dry.    Encounter Medications:   Outpatient Encounter Prescriptions as of 12/15/2016  Medication Sig  . acetaminophen (TYLENOL) 500 MG tablet Take 1,000 mg by mouth every 6 (six) hours as needed (for pain/fever/headaches.).   Marland Kitchen allopurinol (ZYLOPRIM) 300 MG tablet Take 300 mg by mouth daily.   . Ascorbic Acid (VITAMIN C) 1000 MG tablet Take 2,000 mg by mouth 2 (two) times daily.  Marland Kitchen aspirin EC 81 MG EC tablet Take 1 tablet (81 mg total) by mouth daily. (Patient not taking: Reported on 12/08/2016)  . carvedilol (COREG) 25 MG tablet Take 1 tablet (25 mg total) by mouth 2 (two) times daily.  . colchicine 0.6 MG tablet Take 0.6 mg by mouth 2 (two) times daily as needed.  . ferrous sulfate 325 (65 FE) MG tablet Take 325 mg by mouth 2 (two) times daily with a meal.  . finasteride (PROSCAR) 5 MG tablet take 1 tablet by mouth once daily  . Lactobacillus (ACIDOPHOLUS PO) Give 1 Capsule by mouth two times a day for prophylaxis for 3 weeks  . losartan (COZAAR) 50 MG tablet Take 1 tablet (50 mg total) by mouth daily.  Marland Kitchen lovastatin (MEVACOR) 20 MG tablet take 1 tablet by  mouth at bedtime  . Menthol, Topical Analgesic, (BIOFREEZE ROLL-ON) 4 % GEL Apply to bilateral knees topically every 12 hours as needed for pain  . methocarbamol (ROBAXIN) 750 MG tablet Take 1 tablet (750 mg total) by mouth 4 (four) times daily as needed (use for muscle cramps/pain). (Patient not taking: Reported on 12/08/2016)  . mirtazapine (REMERON) 30 MG tablet Take 1 tablet (30 mg total) by mouth at bedtime. (Patient not taking: Reported on 12/08/2016)  . Multiple Vitamins-Minerals (MULTIVITAMIN WITH MINERALS) tablet Take 1 tablet by mouth daily. Men's 50+ Multivitamin  . nitroGLYCERIN (NITROSTAT) 0.4 MG SL tablet Place 1 tablet (0.4 mg total) under the tongue every 5 (five) minutes as needed for chest pain. (Patient not taking: Reported on 12/08/2016)  . Omega-3 1000 MG CAPS Take 2 capsules (2,000 mg total) by mouth 2 (two) times daily.  Marland Kitchen oxyCODONE-acetaminophen (PERCOCET/ROXICET) 5-325 MG tablet Take 1 tablet by mouth every 6 (six) hours as needed for severe pain.  . potassium chloride SA (K-DUR,KLOR-CON) 20 MEQ tablet Take 60 mEq by mouth every morning. And 20 meq by mouth in the evening  . tamsulosin (FLOMAX) 0.4 MG CAPS capsule Take 0.4 mg by mouth daily.   Marland Kitchen torsemide (DEMADEX) 20 MG tablet Take 40 mg by mouth daily after supper.   . traZODone (DESYREL) 100 MG tablet Take 300 mg by mouth at bedtime.  No facility-administered encounter medications on file as of 12/15/2016.     Functional Status:   In your present state of health, do you have any difficulty performing the following activities: 12/15/2016 10/06/2016  Hearing? N N  Vision? Y N  Difficulty concentrating or making decisions? Y N  Walking or climbing stairs? Y Y  Dressing or bathing? N N  Doing errands, shopping? N N  Preparing Food and eating ? N -  Using the Toilet? N -  In the past six months, have you accidently leaked urine? Y -  Do you have problems with loss of bowel control? N -  Managing your Medications? N -   Managing your Finances? Y -  Housekeeping or managing your Housekeeping? N -  Some recent data might be hidden    Fall/Depression Screening:    Fall Risk  12/15/2016 12/09/2016 10/06/2016  Falls in the past year? No No No  Number falls in past yr: - - -  Injury with Fall? - - -  Risk Factor Category  - - -  Risk for fall due to : Impaired mobility;Other (Comment) - -  Risk for fall due to (comments): Home fall risk due to a lot of items obstructing walkway, cat in home, throw rugs - -  Follow up - - -   PHQ 2/9 Scores 12/15/2016 11/28/2016 10/06/2016 09/02/2016 08/04/2016 06/21/2016 06/10/2016  PHQ - 2 Score 5 5 0 0 _0 PHQ- 9 Score 11 11 - - - 12 16    Assessment:   Patient stated he is doing much better. He is ambulatory with no problems. Patient has very little room in home for ambulation due to having a lot of items in the home. In the living room, he has a recliner, there is a recliner for his sister with soiled blue pads remaining in chair, a bariatric bedside commode (belonging to his sister), a coffee table covered with papers and other items, a laundry basket with a pillow soiled with dried blood for their older cat who sleeps in the basked and many more items taking up space in the room. RNCM educated patient regarding fall hazards, including the cat and encouraged to use caution when ambulating in home. Encouraged to keep pathways clear of clutter and ensure he has space to safely walk through home. Patient verbalized understanding.  Patient stated that his home health nurse has been doing dressing changes to wounds at rectum. He believes that they are related to his surgery, but he cannot describe them. He stated he is unable to cleanse and dress them because he cannot get to them and does not know what they look like. Patient stated that his home health nurse is providing supplies for his colostomy. He stated he knows how and is able to change the pouch. He reported he has had some loose  stools that fill the bag in the middle of the night, so he is going to try to eat a little earlier in the afternoons to see if this helps with bedtime stools. He verbalized understanding of what signs and symptoms to look for and report to provider. Patient also has a foley catheter. Patient stated that originally, the plan was to take it out, but after several attempts, he was not been able to void when it is removed, so they have left a foley catheter in. Patient stated that he is hoping to have a test or procedure soon that will determine what is going  on and he hopes that he will be able to get it out. Patient stated this was much of the issues when he came home with mobility. He reported he has had difficulty being mobile or getting around with the colostomy and the catheter. He stated he is more used to having them now and this is improving, but he is hoping to get rid of the catheter.   Patient saw his PCP on 12/09/2016 for hospital follow up. He stated she put him back on the losartan. She also put in a referral for an eye exam - he is currently waiting for this to be scheduled.   Patient denies any shortness of breath, edema in extremities or weight gain. He does not have a set of scales in the home and is unable to weigh himself daily (due to history of heart failure). He stated he used to have scales, but they have since broken and he is unable to weigh.  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 Provided  education on importance of following up with his providers.  [facility] CM Short Term Goal #1   Patient will follow up with PCP within the next week (appointment scheduled 12/09/2016)  [facility] CM Short Term Goal #1 Start Date  12/02/16  [facility] CM Short Term Goal #1 Met Date  12/09/16  Interventions for Short Term Goal #1  RNCM provided education about importance of attending follow up appointments.  Ensured patient has follow up appointment scheduled.  [facility] CM Short Term Goal #2   Patient will be able to verbalize signs and symptoms to look for ((irritation, bruises (dark, bluish color), rashes) during inspection of stoma and peristomal skin area with each pouch change.   [facility] CM Short Term Goal #2 Start Date  12/02/16  Interventions for Short Term Goal #2  RNCM provided education about signs and symptoms to report to MD related to stoma      RNCM to order scales for patient and educated patient that they would come directly to his home. RNCM to continue to follow patient weekly for transition of care and patient is agreeable for continued outreach. He currently has no questions or concerns. Patient to contact RNCM with any needs or issues and he is agreeable.  [place] R. Meko Bellanger, RN, BSN, [place] Management Coordinator [phone]

## 2016-12-16 ENCOUNTER — Other Ambulatory Visit: Payer: Medicare Other

## 2016-12-16 DIAGNOSIS — I4510 Unspecified right bundle-branch block: Secondary | ICD-10-CM | POA: Diagnosis not present

## 2016-12-16 DIAGNOSIS — I272 Pulmonary hypertension, unspecified: Secondary | ICD-10-CM | POA: Diagnosis not present

## 2016-12-16 DIAGNOSIS — C20 Malignant neoplasm of rectum: Secondary | ICD-10-CM | POA: Diagnosis not present

## 2016-12-16 DIAGNOSIS — I10 Essential (primary) hypertension: Secondary | ICD-10-CM

## 2016-12-16 DIAGNOSIS — I255 Ischemic cardiomyopathy: Secondary | ICD-10-CM | POA: Diagnosis not present

## 2016-12-16 DIAGNOSIS — I2510 Atherosclerotic heart disease of native coronary artery without angina pectoris: Secondary | ICD-10-CM | POA: Diagnosis not present

## 2016-12-16 DIAGNOSIS — L988 Other specified disorders of the skin and subcutaneous tissue: Secondary | ICD-10-CM | POA: Diagnosis not present

## 2016-12-16 DIAGNOSIS — N183 Chronic kidney disease, stage 3 (moderate): Secondary | ICD-10-CM | POA: Diagnosis not present

## 2016-12-16 DIAGNOSIS — I4891 Unspecified atrial fibrillation: Secondary | ICD-10-CM | POA: Diagnosis not present

## 2016-12-16 DIAGNOSIS — Z433 Encounter for attention to colostomy: Secondary | ICD-10-CM | POA: Diagnosis not present

## 2016-12-16 DIAGNOSIS — D631 Anemia in chronic kidney disease: Secondary | ICD-10-CM | POA: Diagnosis not present

## 2016-12-16 DIAGNOSIS — I130 Hypertensive heart and chronic kidney disease with heart failure and stage 1 through stage 4 chronic kidney disease, or unspecified chronic kidney disease: Secondary | ICD-10-CM | POA: Diagnosis not present

## 2016-12-16 DIAGNOSIS — I5042 Chronic combined systolic (congestive) and diastolic (congestive) heart failure: Secondary | ICD-10-CM | POA: Diagnosis not present

## 2016-12-17 LAB — BASIC METABOLIC PANEL
BUN / CREAT RATIO: 12 (ref 10–24)
BUN: 16 mg/dL (ref 8–27)
CALCIUM: 8.9 mg/dL (ref 8.6–10.2)
CO2: 22 mmol/L (ref 20–29)
CREATININE: 1.35 mg/dL — AB (ref 0.76–1.27)
Chloride: 106 mmol/L (ref 96–106)
GFR, EST AFRICAN AMERICAN: 62 mL/min/{1.73_m2} (ref >59)
GFR, EST NON AFRICAN AMERICAN: 54 mL/min/{1.73_m2} — AB (ref >59)
Glucose: 87 mg/dL (ref 65–99)
Potassium: 5.2 mmol/L (ref 3.5–5.2)
Sodium: 145 mmol/L — ABNORMAL HIGH (ref 134–144)

## 2016-12-19 ENCOUNTER — Telehealth: Payer: Self-pay | Admitting: *Deleted

## 2016-12-19 DIAGNOSIS — I2510 Atherosclerotic heart disease of native coronary artery without angina pectoris: Secondary | ICD-10-CM | POA: Diagnosis not present

## 2016-12-19 DIAGNOSIS — N183 Chronic kidney disease, stage 3 (moderate): Secondary | ICD-10-CM | POA: Diagnosis not present

## 2016-12-19 DIAGNOSIS — I255 Ischemic cardiomyopathy: Secondary | ICD-10-CM | POA: Diagnosis not present

## 2016-12-19 DIAGNOSIS — D631 Anemia in chronic kidney disease: Secondary | ICD-10-CM | POA: Diagnosis not present

## 2016-12-19 DIAGNOSIS — I4891 Unspecified atrial fibrillation: Secondary | ICD-10-CM | POA: Diagnosis not present

## 2016-12-19 DIAGNOSIS — Z433 Encounter for attention to colostomy: Secondary | ICD-10-CM | POA: Diagnosis not present

## 2016-12-19 DIAGNOSIS — I272 Pulmonary hypertension, unspecified: Secondary | ICD-10-CM | POA: Diagnosis not present

## 2016-12-19 DIAGNOSIS — I4510 Unspecified right bundle-branch block: Secondary | ICD-10-CM | POA: Diagnosis not present

## 2016-12-19 DIAGNOSIS — C20 Malignant neoplasm of rectum: Secondary | ICD-10-CM | POA: Diagnosis not present

## 2016-12-19 DIAGNOSIS — I130 Hypertensive heart and chronic kidney disease with heart failure and stage 1 through stage 4 chronic kidney disease, or unspecified chronic kidney disease: Secondary | ICD-10-CM | POA: Diagnosis not present

## 2016-12-19 DIAGNOSIS — L988 Other specified disorders of the skin and subcutaneous tissue: Secondary | ICD-10-CM | POA: Diagnosis not present

## 2016-12-19 DIAGNOSIS — I5042 Chronic combined systolic (congestive) and diastolic (congestive) heart failure: Secondary | ICD-10-CM | POA: Diagnosis not present

## 2016-12-19 NOTE — Telephone Encounter (Signed)
-----   Message from Sela Hua, MD sent at 12/19/2016  8:09 AM EDT ----- Please let Patrick [name] know that his repeat blood work was normal. His kidney function improved from his last blood work. He should continue the Losartan. Thank you!

## 2016-12-19 NOTE — Telephone Encounter (Signed)
Patient is aware of results. Patrick [name],CMA  

## 2016-12-20 ENCOUNTER — Encounter: Payer: Self-pay | Admitting: Surgery

## 2016-12-20 DIAGNOSIS — E43 Unspecified severe protein-calorie malnutrition: Secondary | ICD-10-CM | POA: Insufficient documentation

## 2016-12-20 DIAGNOSIS — C20 Malignant neoplasm of rectum: Secondary | ICD-10-CM | POA: Diagnosis not present

## 2016-12-20 NOTE — Progress Notes (Signed)
[place]  Telephone:([phone] [phone]  Clinic Follow up Note   Patient Care Team: Mayo, Pete Pelt, MD as PCP - General (Family Medicine) Kyung Rudd, MD as Consulting Physician (Radiation Oncology) Truitt Merle, MD as Consulting Physician (Hematology) Minus Breeding, MD as Consulting Physician (Cardiology) Michael Boston, MD as Consulting Physician (General Surgery) Ardis Hughs, MD as Attending Physician (Urology) Gildardo Cranker, DO as Consulting Physician (Internal Medicine) Ruedinger, Drexel Iha, [facility] as [place] Management (Pharmacist) Loletta Specter, RN as [place] Management 12/22/2016   CHIEF COMPLAINTS:  Follow up rectal cancer  Oncology History   Cancer Staging 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 Diverticulosis in the sigmoid colon.      04/01/2016 Initial Biopsy    Rectal mass biopsy showed adenocarcinoma, grade 2      04/04/2016 Imaging    CT chest, abdomen and pelvis with contrast showed no primary rectal carcinoma is not well visualized, 7 mm left posterior perirectal and presacral lymph nodes, indeterminate 1.4 cm low to attenuation lesion in the left hepatic lobe, question hepatic cirrhosis, recommend abdominal MRI for further evaluation. No other evidence of distant metastasis.      04/17/2016 Imaging    MR abdomen w w/o  contrast IMPRESSION: 1. Morphologic features of liver compatible cirrhosis. 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 2. There is a complex lesion within the left kidney which is only partially visualized on this study which was tailored to assess the liver. When compared with study from 04/04/2016 this is favored to represent a small solid enhancing neoplasm, i.e. renal cell carcinoma. Urologic consultation is recommended. 3. The indeterminate lesion within the left lobe of liver described on recent CT remains indeterminate. This large reflects motion artifact which degrades the post-contrast sequences. There are no specific findings associated with this lesion to suggest [place] however correlation with AFP levels would be advised. Additionally, in light of the patient's increased risk for hepatoma and liver metastases, followup imaging with repeat contrast enhanced MRI abdomen in 3-6 months is advised to ensure stability of this lesion.      05/04/2016 Imaging    MR Pelvis w w/o contrast 05/04/16 IMPRESSION: Low rectal adenocarcinoma, T2 N1 MX by imaging. Distance from inferior tumor margin to anorectal junction is <1 cm.      05/12/2016 - 06/28/2016 Radiation Therapy    Concurrent chemoradiation to his rectal cancer      05/12/2016 - 06/28/2016 Chemotherapy    Xeloda 2000 mg in morning and 1500 mg in the evening, held on 05/29/2016 due to rectal bleeding and a cytopenia, restarted at a low dose (1023m am and 500 pm) on 06/20/16        05/29/2016 - 06/02/2016 Hospital Admission    The patient was admitted for rectal bleeding, chemotherapy Xeloda was held, he continued radiation. He did not require blood transfusion. Bleeding spontaneously improved.  09/28/2016 Surgery    XI ROBOT ABDOMINOPERINEAL RESECTION  PERMANENT COLOSTOMY  By Dr. Johney Maine      09/28/2016 Pathology Results    Diagnosis 1. Colon, segmental resection for tumor, and rectum, anus -  INVASIVE ADENOCARCINOMA, MODERATELY DIFFERENTIATED, SPANNING 3.1 CM. - TUMOR INVADES INTO PERICOLORECTAL TISSUE. - TUMOR FOCALLY INVOLVES ORIGINAL RADIAL MARGIN (FINAL MARGINS PARTS #2 AND #3 ARE NEGATIVE). - DIVERTICULOSIS. - METASTATIC CARCINOMA IN TWO OF SIXTEEN LYMPH NODES (2/16). - SEE ONCOLOGY TABLE. 2. Soft tissue, biopsy, right posterior lateral margin - BENIGN FIBROADIPOSE TISSUE AND SKELETAL MUSCLE. - NO MALIGNANCY IDENTIFIED. 3. Soft tissue, biopsy, right anterior lateral margin - BENIGN SKELETAL MUSCLE AND SOFT TISSUE. - NO MALIGNANCY IDENTIFIED.       HISTORY OF PRESENTING ILLNESS (04/15/2016):  Patrick [name] 66 y.o. male is here because of his recent diagnosed rectal cancer. He presents to our multidisciplinary GI clinic by himself today.  He has had intermittent rectal bleeding for a few years, which she contributes to hemorrhoids, slightly worse lately. He used to have regular bowel movement twice a day, which has been more irregular lately, and occasional loose or watery bowel movement. No significant rectal pain. He was seen by his urologist for routine follow-up of his BPH, and on the rectal exam rectal mass was palpated, and he was referred to colorectal surgeon Dr. Marcello Moores. Colonoscopy on 04/01/2016 showed a fungating nonobstructing medium-sized mass in the distal rectum, 3 cm in length. Biopsy showed adenocarcinoma grade 2. CT CAP was negative distant metastasis, except a 1.4cm indeterminate lesion in the liver.  Review of systems also reviewed slightly decreased appetite, no signal weight loss. No other pain, nausea, or other symptoms. He has significant cardiomyopathy, nonischemic, has orthopnea, mild, able to function and tolerating routine activity at home. He is the primary caregiver of his sister, who is continuous nursing home. He is single, no children. Retired. No family history of colon cancer.  CURRENT THERAPY: Observation    INTERIM HISTORY:  Patrick [name] returns  for follow-up. He presents to the clinic today. He reports he has been home for 3 weeks now. His bag has been bothering when his stool is loose and the bag gets heavy. That is when accidental leaks occur. He empties it 5-6 times a day.  Stool will be loose and then form up occasionally. He had not tried imodium to stop the diarrhea.  He saw urologist and took it out. They put it back in because they would like to run tests His appetite is not back to normal. He saw his surgeon yesterday. He said he was malnurished. He has two wounds in his rectum, no discharge. They are covered. He is taking 3 ensure a day.  He is no longer taking ASA, lactobacillus , or methocarbamol. He has bilateral knee pain R>L and right hip pain mostly in the morning. The nee pain is constant. He barely take oxycodone  He denies any bleeding that he has noticed.  He does not think  He can handle chemo right now.  He had been taking 1 tablet a day before surgery and now taking 2 tablets a day    MEDICAL HISTORY:  Past Medical History:  Diagnosis Date  . Arthritis    "right hand; right knee" (06/19/2014)  . CAD (coronary artery disease)    2v CAD with subtotal occlusion of small co-dominant RCA and borderline lesion in moderate-sized OM-1  . CHF (congestive heart failure) (HCC)    Preserved EF  .  Coronary artery disease involving native coronary artery of native heart with angina pectoris ([place]) 09/11/2016  . Degenerative joint disease of knee, right   . Dysrhythmia   . Enlarged prostate   . Hyperlipidemia   . Hypertension   . Pneumonia 05/2014  . Prediabetes 10/03/2014  . Scrotal edema 04/03/2015  . SMALL BOWEL OBSTRUCTION, HX OF 08/21/2007   Annotation: with narrowing in the ileocecal region Qualifier: Diagnosis of  By: Hassell Done FNP, Tori Milks      SURGICAL HISTORY: Past Surgical History:  Procedure Laterality Date  . CARDIAC CATHETERIZATION N/A 02/16/2016   Procedure: Right/Left Heart Cath and Coronary Angiography;   Surgeon: Jolaine Artist, MD;  Location: [facility];  Service: Cardiovascular;  Laterality: N/A;  . COLONOSCOPY N/A 04/01/2016   Procedure: COLONOSCOPY;  Surgeon: Leighton Ruff, MD;  Location: [facility];  Service: Endoscopy;  Laterality: N/A;  . INGUINAL HERNIA REPAIR Left 1990's  . IR GENERIC HISTORICAL  07/05/2016   IR RADIOLOGIST EVAL & MGMT 07/05/2016 Corrie Mckusick, DO [facility] INTERV RAD  . XI ROBOT ABDOMINAL PERINEAL RESECTION N/A 09/28/2016   Procedure: XI ROBOT ABDOMINOPERINEAL RESECTION WITH PERMANENT COLOSTOMY ERAS PATHWAY;  Surgeon: Michael Boston, MD;  Location: [facility];  Service: General;  Laterality: N/A;    SOCIAL HISTORY: Social History   Social History  . Marital status: Single    Spouse name: N/A  . Number of children: N/A  . Years of education: N/A   Occupational History  . Not on file.   Social History Main Topics  . Smoking status: Former Smoker    Years: 5.00    Types: Pipe, Landscape architect  . Smokeless tobacco: Never Used     Comment: 06/19/2014 "stopped smoking in ~ 2014; used to smoke a pipe or cigar a couple times/month"  . Alcohol use No  . Drug use: No  . Sexual activity: No   Other Topics Concern  . Not on file   Social History Narrative   Single, lives w/his sister of whom he is caregiver       FAMILY HISTORY: Family History  Problem Relation Age of Onset  . Hypertension Mother   . Diabetes Mother   . Stroke Mother   . Cancer Father 45       Prostate  . Dementia Father   . COPD Sister   . Arthritis Sister   . Edema Sister     ALLERGIES:  is allergic to nsaids.  MEDICATIONS:  Current Outpatient Prescriptions  Medication Sig Dispense Refill  . acetaminophen (TYLENOL) 500 MG tablet Take 1,000 mg by mouth every 6 (six) hours as needed (for pain/fever/headaches.).     Marland Kitchen allopurinol (ZYLOPRIM) 300 MG tablet Take 1 tablet (300 mg total) by mouth daily. 90 tablet 0  . Ascorbic Acid (VITAMIN [name]) 1000 MG tablet Take 2,000 mg by mouth 2 (two)  times daily.    Marland Kitchen aspirin EC 81 MG EC tablet Take 1 tablet (81 mg total) by mouth daily. (Patient not taking: Reported on 12/08/2016) 30 tablet 0  . carvedilol (COREG) 25 MG tablet Take 1 tablet (25 mg total) by mouth 2 (two) times daily. 180 tablet 3  . colchicine 0.6 MG tablet Take 0.6 mg by mouth 2 (two) times daily as needed.    . ferrous sulfate 325 (65 FE) MG tablet Take 1 tablet (325 mg total) by mouth 2 (two) times daily with a meal. 180 tablet 0  . finasteride (PROSCAR) 5 MG tablet take 1 tablet by  mouth once daily 90 tablet 1  . Lactobacillus (ACIDOPHOLUS PO) Give 1 Capsule by mouth two times a day for prophylaxis for 3 weeks    . losartan (COZAAR) 50 MG tablet Take 1 tablet (50 mg total) by mouth daily. 90 tablet 3  . lovastatin (MEVACOR) 20 MG tablet Take 1 tablet (20 mg total) by mouth at bedtime. 90 tablet 1  . Menthol, Topical Analgesic, (BIOFREEZE ROLL-ON) 4 % GEL Apply to bilateral knees topically every 12 hours as needed for pain    . methocarbamol (ROBAXIN) 750 MG tablet Take 1 tablet (750 mg total) by mouth 4 (four) times daily as needed (use for muscle cramps/pain). (Patient not taking: Reported on 12/08/2016) 30 tablet 2  . mirtazapine (REMERON) 30 MG tablet Take 1 tablet (30 mg total) by mouth at bedtime. (Patient not taking: Reported on 12/08/2016) 30 tablet 1  . Multiple Vitamins-Minerals (MULTIVITAMIN WITH MINERALS) tablet Take 1 tablet by mouth daily. Men's 50+ Multivitamin    . nitroGLYCERIN (NITROSTAT) 0.4 MG SL tablet Place 1 tablet (0.4 mg total) under the tongue every 5 (five) minutes as needed for chest pain. (Patient not taking: Reported on 12/08/2016) 25 tablet 3  . Omega-3 1000 MG CAPS Take 2 capsules (2,000 mg total) by mouth 2 (two) times daily. 180 capsule 1  . oxyCODONE-acetaminophen (PERCOCET/ROXICET) 5-325 MG tablet Take 1 tablet by mouth every 6 (six) hours as needed for severe pain.    . potassium chloride SA (K-DUR,KLOR-CON) 20 MEQ tablet Take 60 mEq by mouth  every morning. And 20 meq by mouth in the evening    . tamsulosin (FLOMAX) 0.4 MG CAPS capsule Take 0.4 mg by mouth daily.     Marland Kitchen torsemide (DEMADEX) 20 MG tablet Take 40 mg by mouth daily after supper.     . traZODone (DESYREL) 100 MG tablet Take 300 mg by mouth at bedtime.   0   No current facility-administered medications for this visit.     REVIEW OF SYSTEMS:   Constitutional: Denies fevers, chills or abnormal night sweats (+) fatigue, weakness Eyes: Denies blurriness of vision, double vision or watery eyes Ears, nose, mouth, throat, and face: Denies mucositis or sore throat Respiratory: Denies cough, dyspnea or wheezes (+) SOB on exertion. Cardiovascular: Denies palpitation, chest discomfort or lower extremity swelling Gastrointestinal:  Denies heartburn. (+) on ongoing diarrhea  Skin: Denies abnormal skin rashes (+) skin breakdown/open wound on his inner buttock Lymphatics: Denies new lymphadenopathy or easy bruising Neurological:Denies numbness, tingling or new weaknesses MSK: (+) constant bilateral knee pain R>L, occasional right hip pain, trouble walking  Urinary: (+) Trouble urinating, UTI Behavioral/Psych: Mood is stable, no new changes  All other systems were reviewed with the patient and are negative.  PHYSICAL EXAMINATION: ECOG PERFORMANCE STATUS: 2-3  Vitals:   12/22/16 1534  BP: (!) 142/80  Pulse: 77  Resp: 18  Temp: 98 F (36.7 [name])   Filed Weights   12/22/16 1534  Weight: 188 lb 14.4 oz (85.7 kg)    GENERAL:alert, no distress and comfortable SKIN: skin color, texture, turgor are normal, no rashes or significant lesions (+) appetite improving  EYES: normal, conjunctiva are pink and non-injected, sclera clear OROPHARYNX:no exudate, no erythema and lips, buccal mucosa, and tongue normal  NECK: supple, thyroid normal size, non-tender, without nodularity LYMPH:  no palpable lymphadenopathy in the cervical, axillary or inguinal LUNGS: clear to auscultation and  percussion with normal breathing effort HEART: regular rate & rhythm and no murmurs and no lower  extremity edema ABDOMEN:abdomen soft, non-tender and normal bowel sounds, rectal exam showed a firm mass in the low rectum anteriorly, no bleeding. Bilateral inguinal lymph nodes were negative. (+) colostomy bag in Left mid abdomen looks well, slight hernia MSK: (+) extreme knee pain (gout) Musculoskeletal:no cyanosis of digits and no clubbing  PSYCH: alert & oriented x 3 with fluent speech NEURO: no focal motor/sensory deficits  LABORATORY DATA:  I have reviewed the data as listed CBC Latest Ref Rng & Units 12/22/2016 11/11/2016 11/09/2016  WBC 4.0 - 10.3 10e3/uL 6.7 7.6 10.0  Hemoglobin 13.0 - 17.1 g/dL 10.8(L) 11.3(A) 15.4  Hematocrit 38.4 - 49.9 % 34.7(L) 35(A) 43  Platelets 140 - 400 10e3/uL 113(L) 154 264   CMP Latest Ref Rng & Units 12/22/2016 12/16/2016 12/09/2016  Glucose 70 - 140 mg/dl 104 87 102(H)  BUN 7.0 - 26.0 mg/dL 16.'2 16 19  ' Creatinine 0.7 - 1.3 mg/dL 1.2 1.35(H) 1.37(H)  Sodium 136 - 145 mEq/L 143 145(H) 144  Potassium 3.5 - 5.1 mEq/L 4.3 5.2 4.7  Chloride 96 - 106 mmol/L - 106 106  CO2 22 - 29 mEq/L '26 22 24  ' Calcium 8.4 - 10.4 mg/dL 9.6 8.9 8.6  Total Protein 6.4 - 8.3 g/dL 7.6 - -  Total Bilirubin 0.20 - 1.20 mg/dL 0.63 - -  Alkaline Phos 40 - 150 U/L 118 - -  AST 5 - 34 U/L 13 - -  ALT 0 - 55 U/L 9 - -   PATHOLOGY REPORT  Diagnosis 04/01/2016 Rectum, biopsy, mass INVASIVE ADENOCARCINOMA, GRADE 2 MARGIN OF RESECTION IS POSITIVE FOR ADENOCARCINOMA  Diagnosis 09/28/16 1. Colon, segmental resection for tumor, and rectum, anus - INVASIVE ADENOCARCINOMA, MODERATELY DIFFERENTIATED, SPANNING 3.1 CM. - TUMOR INVADES INTO PERICOLORECTAL TISSUE. - TUMOR FOCALLY INVOLVES ORIGINAL RADIAL MARGIN (FINAL MARGINS PARTS #2 AND #3 ARE NEGATIVE). - DIVERTICULOSIS. - METASTATIC CARCINOMA IN TWO OF SIXTEEN LYMPH NODES (2/16). - SEE ONCOLOGY TABLE. 2. Soft tissue, biopsy, right  posterior lateral margin - BENIGN FIBROADIPOSE TISSUE AND SKELETAL MUSCLE. - NO MALIGNANCY IDENTIFIED. 3. Soft tissue, biopsy, right anterior lateral margin - BENIGN SKELETAL MUSCLE AND SOFT TISSUE. - NO MALIGNANCY IDENTIFIED. Microscopic Comment 1. COLON AND RECTUM (INCLUDING TRANS-ANAL RESECTION): Specimen: Sigmoid colon, rectum, anus. Procedure: Segmental resection. Tumor site: Distal third rectum. Specimen integrity: Intact. Macroscopic intactness of mesorectum: Near complete. Macroscopic tumor perforation: Not identified. Invasive tumor: Maximum size: 3.1 cm. Histologic type(s): Invasive adenocarcinoma. Histologic grade and differentiation: G2: moderately differentiated/low grade Type of polyp in which invasive carcinoma arose: N/A. Microscopic extension of invasive tumor: Tumor invades into pericolorectal tissue. Lymph-Vascular invasion: Definitive invasion not identified. Peri-neural invasion: Present. 1 of 3 FINAL for Patrick [name], Patrick [name] ([id number]) Microscopic Comment(continued) Tumor deposit(s) (discontinuous extramural extension): Not identified. Resection margins: Proximal margin: Negative. Distal margin: Negative. Circumferential (radial) (posterior ascending, posterior descending; lateral and posterior mid-rectum; and entire lower 1/3 rectum): Focally positive. Mesenteric margin (sigmoid and transverse): N/A. Distance closest margin (if all above margins negative): N/A. Treatment effect (neo-adjuvant therapy): Minimally present. (TRS 3). Additional polyp(s): None. Non-neoplastic findings: Diverticulosis. Lymph nodes: number examined 16; number positive: 2 Pathologic Staging: ypT3, ypN1b, ypMX Ancillary studies: Can be ordered upon request. Comment: There is a focus of tumor extending to the inked resection margin in the main specimen (confirmed on deeper levels), however, additional margins (parts #2 and #3) are negative. MMR-normal    PROCEDURES   COLONOSCOPY  04/01/2016 Dr. Marcello Moores  A fungating non-obstructing medium-sized mass was found in the distal rectum. The  mass was noncircumferential. The mass measured three cm in length. No bleeding was present. Biopsies were taken with a cold forceps for histology. IMPRESSION  - Diverticulosis in the sigmoid colon. 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 Biopsied.   RADIOGRAPHIC STUDIES: I have personally reviewed the radiological images as listed and agreed with the findings in the report. No results found.     ASSESSMENT & PLAN: 66 y.o. Caucasian male, with past medical history of hypertension, chronic systolic heart failure with EF of 20-25%, iron deficient anemia, presented with a palpable rectal mass.  1. Adenoma of rectum, low anterior rectum, cT2N1M0, stage IIIA, pT3N1b -I previously reviewed his CT scan, endoscopy, and biopsy results in details with patient -His CT scan reviewed no definitive evidence of distant metastasis, however he a 1.4 cm indeterminate liver lesion, need further workup.  -His pelvic MRI showed T2N1 disease, I reviewed with patient -His abdominal MRI showed indeterminate liver lesion, needs close follow-up. He also has imaging evidence of liver cirrhosis, unclear etiology. AFP was normal  -He underwent neoadjuvant chemoradiation, tolerated well. However due to his significant cytopenia, Xeloda was held and the dose reduced significantly. -He finally underwent APR, surgery was somewhat delayed due to his compliance issue and switching of his surgeon. -I discussed his surgical pathology findings, which showed residual T3 lesion and 2/16 positive lymph nodes. Surgical margins were negative. -I discussed the high risk of recurrence due to his locally advanced disease. I recommend adjuvant chemotherapy, however he has been recovering poorly from his surgery, it's been almost 3 months after her surgery, she has recovered well, but still does not feel strong enough to take  chemotherapy, even Xeloda alone. -After lengthy discussion, we decided not to take adjuvant chemotherapy. ---I discussed the risk of cancer recurrence in the future. I discussed the surveillance plan, which is a physical exam and lab test (including CBC, CMP and CEA) every 3-4 months for the first 2 years, then every 6-12 months, colonoscopy in one year, and surveilliance CT scan every 6-12 month for up to 5 year.  -Labs reviewed and he is anemic, his Plt are still low. Will closely monitor. I encourage him to keep taking iron pill.  -Will f/u in 02/2017 with CT AP scan.  -We discussed the signs of cancer recurrence, he knows what to watch, and will contact us if he has no concerns.   2. HTN, chronic systolic heart failure with EF 20-25% -He will continue follow-up with his primary care physician and cardiologist -He will follow-up with his cardiologist closely -Creatinine 1.7 (10/19/16), worse than before -Cr is 1.2 today (12/22/16), improved.   3. Iron deficient anemia -His on study in 2016 was consistent with iron deficiency -He is on oral iron, we'll continue -Iron on 06/20/16 was 56.  -He received a blood transfusion on 10/03/16 -He was taking 1 iron pill currently so I increased his iron to 2 tablets daily.  -Due to Hg 10.8 today (12/22/16)  I will test his ferritin and if low we may need to give him IViron.   4. Liver lesion -He has a 1.4 cm indeterminate liver lesion in the left lobe, found on his staging CT scan, and was evaluated by MRI, no typical MRI image characteristics of hepatocellular carcinoma, however image quality was limited. Giving the imaging evidence of liver cirrhosis, AFP was normal. -MRI of the abd on 04/17/16 shows the liver lesion remains indeterminate. Will repeat in the future.  5. Obstructive uropathy, (+) foley  -  He will continue follow-up with his urologist  6. Knee Pain -He has extreme knee pain in both knees and has trouble walking, being managed by his PCP   -He will f/u with PCP  7. Depression -He admits to depression and has not told his sister about it.  -He has had suicidal thought previously but has reported he no longer feels that way.  -Mirtazapine is not doing anything for him. He feels it is not given to him properly, he would like to change to regular pill.  -I will previously increased his mirtazapine dose from 50 mg to 30 mg at bedtime  8. Memory issue -Patient has developed significant memory loss after surgery, seems to be better lately.   Plan -f/u in 8 weeks  -Lab and CT abd/pel w contrast a few days before    All questions were answered. The patient knows to call the clinic with any problems, questions or concerns. I spent 25 minutes counseling the patient face to face. The total time spent in the appointment was 30 minutes and more than 50% was on counseling.  This document serves as a record of services personally performed by Truitt Merle, MD. It was created on her behalf by Joslyn Devon, a trained medical scribe. The creation of this record is based on the scribe's personal observations and the provider's statements to them. This document has been checked and approved by the attending provider.    Truitt Merle, MD 12/22/2016

## 2016-12-21 DIAGNOSIS — D631 Anemia in chronic kidney disease: Secondary | ICD-10-CM | POA: Diagnosis not present

## 2016-12-21 DIAGNOSIS — I272 Pulmonary hypertension, unspecified: Secondary | ICD-10-CM | POA: Diagnosis not present

## 2016-12-21 DIAGNOSIS — I4891 Unspecified atrial fibrillation: Secondary | ICD-10-CM | POA: Diagnosis not present

## 2016-12-21 DIAGNOSIS — I255 Ischemic cardiomyopathy: Secondary | ICD-10-CM | POA: Diagnosis not present

## 2016-12-21 DIAGNOSIS — L988 Other specified disorders of the skin and subcutaneous tissue: Secondary | ICD-10-CM | POA: Diagnosis not present

## 2016-12-21 DIAGNOSIS — N183 Chronic kidney disease, stage 3 (moderate): Secondary | ICD-10-CM | POA: Diagnosis not present

## 2016-12-21 DIAGNOSIS — I5042 Chronic combined systolic (congestive) and diastolic (congestive) heart failure: Secondary | ICD-10-CM | POA: Diagnosis not present

## 2016-12-21 DIAGNOSIS — Z433 Encounter for attention to colostomy: Secondary | ICD-10-CM | POA: Diagnosis not present

## 2016-12-21 DIAGNOSIS — I4510 Unspecified right bundle-branch block: Secondary | ICD-10-CM | POA: Diagnosis not present

## 2016-12-21 DIAGNOSIS — C20 Malignant neoplasm of rectum: Secondary | ICD-10-CM | POA: Diagnosis not present

## 2016-12-21 DIAGNOSIS — I2510 Atherosclerotic heart disease of native coronary artery without angina pectoris: Secondary | ICD-10-CM | POA: Diagnosis not present

## 2016-12-21 DIAGNOSIS — I130 Hypertensive heart and chronic kidney disease with heart failure and stage 1 through stage 4 chronic kidney disease, or unspecified chronic kidney disease: Secondary | ICD-10-CM | POA: Diagnosis not present

## 2016-12-22 ENCOUNTER — Encounter: Payer: Self-pay | Admitting: Hematology

## 2016-12-22 ENCOUNTER — Telehealth: Payer: Self-pay | Admitting: Hematology

## 2016-12-22 ENCOUNTER — Ambulatory Visit (HOSPITAL_BASED_OUTPATIENT_CLINIC_OR_DEPARTMENT_OTHER): Payer: Medicare Other | Admitting: Hematology

## 2016-12-22 ENCOUNTER — Other Ambulatory Visit (HOSPITAL_BASED_OUTPATIENT_CLINIC_OR_DEPARTMENT_OTHER): Payer: Medicare Other

## 2016-12-22 ENCOUNTER — Other Ambulatory Visit: Payer: Self-pay | Admitting: Internal Medicine

## 2016-12-22 VITALS — BP 142/80 | HR 77 | Temp 98.0°F | Resp 18 | Ht 69.0 in | Wt 188.9 lb

## 2016-12-22 DIAGNOSIS — C20 Malignant neoplasm of rectum: Secondary | ICD-10-CM

## 2016-12-22 DIAGNOSIS — I5042 Chronic combined systolic (congestive) and diastolic (congestive) heart failure: Secondary | ICD-10-CM | POA: Diagnosis not present

## 2016-12-22 DIAGNOSIS — F329 Major depressive disorder, single episode, unspecified: Secondary | ICD-10-CM

## 2016-12-22 DIAGNOSIS — L988 Other specified disorders of the skin and subcutaneous tissue: Secondary | ICD-10-CM | POA: Diagnosis not present

## 2016-12-22 DIAGNOSIS — I130 Hypertensive heart and chronic kidney disease with heart failure and stage 1 through stage 4 chronic kidney disease, or unspecified chronic kidney disease: Secondary | ICD-10-CM | POA: Diagnosis not present

## 2016-12-22 DIAGNOSIS — K769 Liver disease, unspecified: Secondary | ICD-10-CM

## 2016-12-22 DIAGNOSIS — D509 Iron deficiency anemia, unspecified: Secondary | ICD-10-CM | POA: Diagnosis not present

## 2016-12-22 DIAGNOSIS — I5022 Chronic systolic (congestive) heart failure: Secondary | ICD-10-CM | POA: Diagnosis not present

## 2016-12-22 DIAGNOSIS — I10 Essential (primary) hypertension: Secondary | ICD-10-CM

## 2016-12-22 DIAGNOSIS — M25562 Pain in left knee: Secondary | ICD-10-CM

## 2016-12-22 DIAGNOSIS — M25561 Pain in right knee: Secondary | ICD-10-CM | POA: Diagnosis not present

## 2016-12-22 DIAGNOSIS — I272 Pulmonary hypertension, unspecified: Secondary | ICD-10-CM | POA: Diagnosis not present

## 2016-12-22 DIAGNOSIS — Z433 Encounter for attention to colostomy: Secondary | ICD-10-CM | POA: Diagnosis not present

## 2016-12-22 DIAGNOSIS — I2510 Atherosclerotic heart disease of native coronary artery without angina pectoris: Secondary | ICD-10-CM | POA: Diagnosis not present

## 2016-12-22 DIAGNOSIS — D500 Iron deficiency anemia secondary to blood loss (chronic): Secondary | ICD-10-CM

## 2016-12-22 DIAGNOSIS — R413 Other amnesia: Secondary | ICD-10-CM | POA: Diagnosis not present

## 2016-12-22 DIAGNOSIS — N139 Obstructive and reflux uropathy, unspecified: Secondary | ICD-10-CM

## 2016-12-22 DIAGNOSIS — I255 Ischemic cardiomyopathy: Secondary | ICD-10-CM | POA: Diagnosis not present

## 2016-12-22 DIAGNOSIS — I4510 Unspecified right bundle-branch block: Secondary | ICD-10-CM | POA: Diagnosis not present

## 2016-12-22 DIAGNOSIS — I4891 Unspecified atrial fibrillation: Secondary | ICD-10-CM | POA: Diagnosis not present

## 2016-12-22 DIAGNOSIS — N183 Chronic kidney disease, stage 3 (moderate): Secondary | ICD-10-CM | POA: Diagnosis not present

## 2016-12-22 DIAGNOSIS — D631 Anemia in chronic kidney disease: Secondary | ICD-10-CM | POA: Diagnosis not present

## 2016-12-22 LAB — CBC WITH DIFFERENTIAL/PLATELET
BASO%: 0.1 % (ref 0.0–2.0)
BASOS ABS: 0 10*3/uL (ref 0.0–0.1)
EOS ABS: 0.1 10*3/uL (ref 0.0–0.5)
EOS%: 1 % (ref 0.0–7.0)
HEMATOCRIT: 34.7 % — AB (ref 38.4–49.9)
HGB: 10.8 g/dL — ABNORMAL LOW (ref 13.0–17.1)
LYMPH%: 7.6 % — ABNORMAL LOW (ref 14.0–49.0)
MCH: 26.5 pg — ABNORMAL LOW (ref 27.2–33.4)
MCHC: 31.1 g/dL — ABNORMAL LOW (ref 32.0–36.0)
MCV: 85 fL (ref 79.3–98.0)
MONO#: 0.6 10*3/uL (ref 0.1–0.9)
MONO%: 8.8 % (ref 0.0–14.0)
NEUT#: 5.5 10*3/uL (ref 1.5–6.5)
NEUT%: 82.5 % — ABNORMAL HIGH (ref 39.0–75.0)
NRBC: 0 % (ref 0–0)
PLATELETS: 113 10*3/uL — AB (ref 140–400)
RBC: 4.08 10*6/uL — AB (ref 4.20–5.82)
RDW: 18.7 % — AB (ref 11.0–14.6)
WBC: 6.7 10*3/uL (ref 4.0–10.3)
lymph#: 0.5 10*3/uL — ABNORMAL LOW (ref 0.9–3.3)

## 2016-12-22 LAB — COMPREHENSIVE METABOLIC PANEL
ALT: 9 U/L (ref 0–55)
ANION GAP: 9 meq/L (ref 3–11)
AST: 13 U/L (ref 5–34)
Albumin: 3.2 g/dL — ABNORMAL LOW (ref 3.5–5.0)
Alkaline Phosphatase: 118 U/L (ref 40–150)
BUN: 16.2 mg/dL (ref 7.0–26.0)
CHLORIDE: 108 meq/L (ref 98–109)
CO2: 26 meq/L (ref 22–29)
CREATININE: 1.2 mg/dL (ref 0.7–1.3)
Calcium: 9.6 mg/dL (ref 8.4–10.4)
EGFR: 63 mL/min/{1.73_m2} — ABNORMAL LOW (ref >90)
Glucose: 104 mg/dl (ref 70–140)
POTASSIUM: 4.3 meq/L (ref 3.5–5.1)
Sodium: 143 mEq/L (ref 136–145)
Total Bilirubin: 0.63 mg/dL (ref 0.20–1.20)
Total Protein: 7.6 g/dL (ref 6.4–8.3)

## 2016-12-22 LAB — TECHNOLOGIST REVIEW

## 2016-12-22 MED ORDER — FERROUS SULFATE 325 (65 FE) MG PO TABS: 325.0000 mg | ORAL_TABLET | Freq: Two times a day (BID) | ORAL | 0 refills | Status: DC

## 2016-12-22 MED ORDER — LOVASTATIN 20 MG PO TABS: 20.0000 mg | ORAL_TABLET | Freq: Every day | ORAL | 1 refills | Status: DC

## 2016-12-22 MED ORDER — ALLOPURINOL 300 MG PO TABS: 300.0000 mg | ORAL_TABLET | Freq: Every day | ORAL | 0 refills | Status: DC

## 2016-12-22 NOTE — Telephone Encounter (Signed)
Gave patient avs report and appointments September.  °

## 2016-12-22 NOTE — Telephone Encounter (Signed)
Pt needs 90 day supply of : allopurionol, lovastatin, and ferrous sulfate. Pt uses [facility] on [address]

## 2016-12-23 ENCOUNTER — Other Ambulatory Visit: Payer: Self-pay

## 2016-12-23 LAB — FERRITIN: Ferritin: 125 ng/ml (ref 22–316)

## 2016-12-23 NOTE — Patient Outreach (Signed)
[facility]) Care Management  12/23/16  FIELDS OROS 12-17-1950 [id number]  Successful outreach completed with patient. Patient identification verified.  Subjective: Patient stated "I have been doing ok." He stated his home health nurse came out to see him today and will be back next Monday. Objective:  Assessment: Patient stated that the home health nurse is continuing to do dressing changes to the wound near his rectal area. He is still unable to verbalize what the wound looks like as he cannot see it. He stated he thinks it is getting better, but is not sure. Patient currently denies any pain or problems. Denies any signs of symptoms of infection. Patient also denies any chest pain, shortness of breath or edema. Patient has not yet received scales so he has not been weighing.  Plan:  RNCM will continue to make outreach calls for transition of care to follow up with patient and patient is agreeable with plan. RNCM to make telephonic outreach to patient next week and he stated he was appreciative for [facility] following up with him.  He was encouraged to call RNCM with any questions or concerns.  [place] R. Alandis Bluemel, RN, BSN, [place] Management Coordinator [phone]

## 2016-12-26 ENCOUNTER — Telehealth: Payer: Self-pay | Admitting: Internal Medicine

## 2016-12-26 DIAGNOSIS — N183 Chronic kidney disease, stage 3 (moderate): Secondary | ICD-10-CM | POA: Diagnosis not present

## 2016-12-26 DIAGNOSIS — C20 Malignant neoplasm of rectum: Secondary | ICD-10-CM | POA: Diagnosis not present

## 2016-12-26 DIAGNOSIS — I130 Hypertensive heart and chronic kidney disease with heart failure and stage 1 through stage 4 chronic kidney disease, or unspecified chronic kidney disease: Secondary | ICD-10-CM | POA: Diagnosis not present

## 2016-12-26 DIAGNOSIS — Z433 Encounter for attention to colostomy: Secondary | ICD-10-CM | POA: Diagnosis not present

## 2016-12-26 DIAGNOSIS — L988 Other specified disorders of the skin and subcutaneous tissue: Secondary | ICD-10-CM | POA: Diagnosis not present

## 2016-12-26 DIAGNOSIS — D631 Anemia in chronic kidney disease: Secondary | ICD-10-CM | POA: Diagnosis not present

## 2016-12-26 DIAGNOSIS — I4891 Unspecified atrial fibrillation: Secondary | ICD-10-CM | POA: Diagnosis not present

## 2016-12-26 DIAGNOSIS — I272 Pulmonary hypertension, unspecified: Secondary | ICD-10-CM | POA: Diagnosis not present

## 2016-12-26 DIAGNOSIS — I2510 Atherosclerotic heart disease of native coronary artery without angina pectoris: Secondary | ICD-10-CM | POA: Diagnosis not present

## 2016-12-26 DIAGNOSIS — I255 Ischemic cardiomyopathy: Secondary | ICD-10-CM | POA: Diagnosis not present

## 2016-12-26 DIAGNOSIS — I5042 Chronic combined systolic (congestive) and diastolic (congestive) heart failure: Secondary | ICD-10-CM | POA: Diagnosis not present

## 2016-12-26 DIAGNOSIS — I4510 Unspecified right bundle-branch block: Secondary | ICD-10-CM | POA: Diagnosis not present

## 2016-12-26 MED ORDER — FERROUS SULFATE 325 (65 FE) MG PO TABS: 325.0000 mg | ORAL_TABLET | Freq: Two times a day (BID) | ORAL | 0 refills | Status: DC

## 2016-12-26 MED ORDER — ALLOPURINOL 300 MG PO TABS: 300.0000 mg | ORAL_TABLET | Freq: Every day | ORAL | 0 refills | Status: DC

## 2016-12-26 MED ORDER — LOVASTATIN 20 MG PO TABS: 20.0000 mg | ORAL_TABLET | Freq: Every day | ORAL | 1 refills | Status: DC

## 2016-12-26 NOTE — Telephone Encounter (Signed)
Medications sent to [facility] per patient request.  Derl Barrow, RN

## 2016-12-26 NOTE — Telephone Encounter (Signed)
Refills for allopurionol lovastatin, and ferrous sulfate were sent to the incorrect pharmacy. Please send them all to [facility] on [address].

## 2016-12-27 ENCOUNTER — Other Ambulatory Visit: Payer: Self-pay | Admitting: Internal Medicine

## 2016-12-27 DIAGNOSIS — Z87898 Personal history of other specified conditions: Secondary | ICD-10-CM

## 2016-12-27 NOTE — Progress Notes (Signed)
ef

## 2016-12-29 DIAGNOSIS — D631 Anemia in chronic kidney disease: Secondary | ICD-10-CM | POA: Diagnosis not present

## 2016-12-29 DIAGNOSIS — C20 Malignant neoplasm of rectum: Secondary | ICD-10-CM | POA: Diagnosis not present

## 2016-12-29 DIAGNOSIS — L988 Other specified disorders of the skin and subcutaneous tissue: Secondary | ICD-10-CM | POA: Diagnosis not present

## 2016-12-29 DIAGNOSIS — I4891 Unspecified atrial fibrillation: Secondary | ICD-10-CM | POA: Diagnosis not present

## 2016-12-29 DIAGNOSIS — I5042 Chronic combined systolic (congestive) and diastolic (congestive) heart failure: Secondary | ICD-10-CM | POA: Diagnosis not present

## 2016-12-29 DIAGNOSIS — I255 Ischemic cardiomyopathy: Secondary | ICD-10-CM | POA: Diagnosis not present

## 2016-12-29 DIAGNOSIS — I2510 Atherosclerotic heart disease of native coronary artery without angina pectoris: Secondary | ICD-10-CM | POA: Diagnosis not present

## 2016-12-29 DIAGNOSIS — I130 Hypertensive heart and chronic kidney disease with heart failure and stage 1 through stage 4 chronic kidney disease, or unspecified chronic kidney disease: Secondary | ICD-10-CM | POA: Diagnosis not present

## 2016-12-29 DIAGNOSIS — I4510 Unspecified right bundle-branch block: Secondary | ICD-10-CM | POA: Diagnosis not present

## 2016-12-29 DIAGNOSIS — I272 Pulmonary hypertension, unspecified: Secondary | ICD-10-CM | POA: Diagnosis not present

## 2016-12-29 DIAGNOSIS — N183 Chronic kidney disease, stage 3 (moderate): Secondary | ICD-10-CM | POA: Diagnosis not present

## 2016-12-29 DIAGNOSIS — Z433 Encounter for attention to colostomy: Secondary | ICD-10-CM | POA: Diagnosis not present

## 2016-12-30 DIAGNOSIS — I130 Hypertensive heart and chronic kidney disease with heart failure and stage 1 through stage 4 chronic kidney disease, or unspecified chronic kidney disease: Secondary | ICD-10-CM | POA: Diagnosis not present

## 2016-12-30 DIAGNOSIS — I5042 Chronic combined systolic (congestive) and diastolic (congestive) heart failure: Secondary | ICD-10-CM | POA: Diagnosis not present

## 2016-12-30 DIAGNOSIS — I272 Pulmonary hypertension, unspecified: Secondary | ICD-10-CM | POA: Diagnosis not present

## 2016-12-30 DIAGNOSIS — N183 Chronic kidney disease, stage 3 (moderate): Secondary | ICD-10-CM | POA: Diagnosis not present

## 2016-12-30 DIAGNOSIS — D631 Anemia in chronic kidney disease: Secondary | ICD-10-CM | POA: Diagnosis not present

## 2016-12-30 DIAGNOSIS — L988 Other specified disorders of the skin and subcutaneous tissue: Secondary | ICD-10-CM | POA: Diagnosis not present

## 2016-12-30 DIAGNOSIS — Z433 Encounter for attention to colostomy: Secondary | ICD-10-CM | POA: Diagnosis not present

## 2016-12-30 DIAGNOSIS — I4510 Unspecified right bundle-branch block: Secondary | ICD-10-CM | POA: Diagnosis not present

## 2016-12-30 DIAGNOSIS — I4891 Unspecified atrial fibrillation: Secondary | ICD-10-CM | POA: Diagnosis not present

## 2016-12-30 DIAGNOSIS — C20 Malignant neoplasm of rectum: Secondary | ICD-10-CM | POA: Diagnosis not present

## 2016-12-30 DIAGNOSIS — I2510 Atherosclerotic heart disease of native coronary artery without angina pectoris: Secondary | ICD-10-CM | POA: Diagnosis not present

## 2016-12-30 DIAGNOSIS — I255 Ischemic cardiomyopathy: Secondary | ICD-10-CM | POA: Diagnosis not present

## 2016-12-31 ENCOUNTER — Other Ambulatory Visit: Payer: Self-pay

## 2016-12-31 NOTE — Patient Outreach (Signed)
[facility]) Care Management  12/31/16  Patrick [name] 1951-04-06 [id number]  Successful outreach completed with patient. Patient identification verified.  Patient stated that it has been a rough week. He stated that the rain and cool weather has been rough on his knees, but they are better today. He stated that "they Jayvian't hurt nearly as bad today." Patient reported that his colostomy is "doing ok." He stated that he is getting the hang of changing the pouch and has been doing that on his own without any problems. Patient reported that the pain in his right hip not too bad. He continues to have a foley catheter. He stated that he thinks he will be having testing done in a couple of weeks to see if they can remove it.  He stated that he is looking for his sister to come home shortly. He is her primary caregiver. He stated that she is currently in SNF because he had to have surgery and he will have to be able to help her when she returns home. He anticipates that this will be soon and he feels he will be able to help her. He stated it "may be kind of rough, but I can do it." Patient currently has no other concerns or needs. Plan: Will continue to follow patient for transition of care. [place] R. Santoria Chason, RN, BSN, [place] Management Coordinator [phone]

## 2017-01-02 ENCOUNTER — Other Ambulatory Visit: Payer: Self-pay | Admitting: Internal Medicine

## 2017-01-02 DIAGNOSIS — D631 Anemia in chronic kidney disease: Secondary | ICD-10-CM | POA: Diagnosis not present

## 2017-01-02 DIAGNOSIS — I4891 Unspecified atrial fibrillation: Secondary | ICD-10-CM | POA: Diagnosis not present

## 2017-01-02 DIAGNOSIS — C20 Malignant neoplasm of rectum: Secondary | ICD-10-CM | POA: Diagnosis not present

## 2017-01-02 DIAGNOSIS — Z433 Encounter for attention to colostomy: Secondary | ICD-10-CM | POA: Diagnosis not present

## 2017-01-02 DIAGNOSIS — I2510 Atherosclerotic heart disease of native coronary artery without angina pectoris: Secondary | ICD-10-CM | POA: Diagnosis not present

## 2017-01-02 DIAGNOSIS — I130 Hypertensive heart and chronic kidney disease with heart failure and stage 1 through stage 4 chronic kidney disease, or unspecified chronic kidney disease: Secondary | ICD-10-CM | POA: Diagnosis not present

## 2017-01-02 DIAGNOSIS — I255 Ischemic cardiomyopathy: Secondary | ICD-10-CM | POA: Diagnosis not present

## 2017-01-02 DIAGNOSIS — I5042 Chronic combined systolic (congestive) and diastolic (congestive) heart failure: Secondary | ICD-10-CM | POA: Diagnosis not present

## 2017-01-02 DIAGNOSIS — I4510 Unspecified right bundle-branch block: Secondary | ICD-10-CM | POA: Diagnosis not present

## 2017-01-02 DIAGNOSIS — L988 Other specified disorders of the skin and subcutaneous tissue: Secondary | ICD-10-CM | POA: Diagnosis not present

## 2017-01-02 DIAGNOSIS — I272 Pulmonary hypertension, unspecified: Secondary | ICD-10-CM | POA: Diagnosis not present

## 2017-01-02 DIAGNOSIS — N183 Chronic kidney disease, stage 3 (moderate): Secondary | ICD-10-CM | POA: Diagnosis not present

## 2017-01-02 MED ORDER — TRAZODONE HCL 100 MG PO TABS: 300.0000 mg | ORAL_TABLET | Freq: Every day | ORAL | 2 refills | Status: DC

## 2017-01-02 NOTE — Telephone Encounter (Signed)
Pt calling to request refill of:  Name of Medication(s):  Trazodone. Pt states it has been changed to 3 at nighttime  Last date of OV:  12-09-16 Pharmacy:  [facility]  Will route refill request to Clinic RN.  Discussed with patient policy to call pharmacy for future refills.  Also, discussed refills may take up to 48 hours to approve or deny.  Renella Cunas

## 2017-01-03 ENCOUNTER — Other Ambulatory Visit: Payer: Self-pay | Admitting: Pharmacist

## 2017-01-03 NOTE — Patient Outreach (Signed)
[facility]) Care Management  01/03/2017  DAISHAUN AYRE 01-14-1951 [id number]  Unsuccessful phone outreach to patient.  Gentleman answered phone and reported patient was not available.  No PHI was exchanged.    Plan:  Will attempt to call back another day.    Karrie Meres, PharmD, [place] [phone]

## 2017-01-04 ENCOUNTER — Other Ambulatory Visit: Payer: Self-pay | Admitting: Pharmacist

## 2017-01-04 DIAGNOSIS — I130 Hypertensive heart and chronic kidney disease with heart failure and stage 1 through stage 4 chronic kidney disease, or unspecified chronic kidney disease: Secondary | ICD-10-CM | POA: Diagnosis not present

## 2017-01-04 DIAGNOSIS — I2510 Atherosclerotic heart disease of native coronary artery without angina pectoris: Secondary | ICD-10-CM | POA: Diagnosis not present

## 2017-01-04 DIAGNOSIS — I5042 Chronic combined systolic (congestive) and diastolic (congestive) heart failure: Secondary | ICD-10-CM | POA: Diagnosis not present

## 2017-01-04 DIAGNOSIS — I4510 Unspecified right bundle-branch block: Secondary | ICD-10-CM | POA: Diagnosis not present

## 2017-01-04 DIAGNOSIS — I4891 Unspecified atrial fibrillation: Secondary | ICD-10-CM | POA: Diagnosis not present

## 2017-01-04 DIAGNOSIS — C20 Malignant neoplasm of rectum: Secondary | ICD-10-CM | POA: Diagnosis not present

## 2017-01-04 DIAGNOSIS — D631 Anemia in chronic kidney disease: Secondary | ICD-10-CM | POA: Diagnosis not present

## 2017-01-04 DIAGNOSIS — I255 Ischemic cardiomyopathy: Secondary | ICD-10-CM | POA: Diagnosis not present

## 2017-01-04 DIAGNOSIS — Z433 Encounter for attention to colostomy: Secondary | ICD-10-CM | POA: Diagnosis not present

## 2017-01-04 DIAGNOSIS — L988 Other specified disorders of the skin and subcutaneous tissue: Secondary | ICD-10-CM | POA: Diagnosis not present

## 2017-01-04 DIAGNOSIS — N183 Chronic kidney disease, stage 3 (moderate): Secondary | ICD-10-CM | POA: Diagnosis not present

## 2017-01-04 DIAGNOSIS — I272 Pulmonary hypertension, unspecified: Secondary | ICD-10-CM | POA: Diagnosis not present

## 2017-01-05 ENCOUNTER — Other Ambulatory Visit: Payer: Self-pay | Admitting: Pharmacist

## 2017-01-05 DIAGNOSIS — C20 Malignant neoplasm of rectum: Secondary | ICD-10-CM | POA: Diagnosis not present

## 2017-01-05 DIAGNOSIS — I272 Pulmonary hypertension, unspecified: Secondary | ICD-10-CM | POA: Diagnosis not present

## 2017-01-05 DIAGNOSIS — Z433 Encounter for attention to colostomy: Secondary | ICD-10-CM | POA: Diagnosis not present

## 2017-01-05 DIAGNOSIS — I4891 Unspecified atrial fibrillation: Secondary | ICD-10-CM | POA: Diagnosis not present

## 2017-01-05 DIAGNOSIS — D631 Anemia in chronic kidney disease: Secondary | ICD-10-CM | POA: Diagnosis not present

## 2017-01-05 DIAGNOSIS — I5042 Chronic combined systolic (congestive) and diastolic (congestive) heart failure: Secondary | ICD-10-CM | POA: Diagnosis not present

## 2017-01-05 DIAGNOSIS — L988 Other specified disorders of the skin and subcutaneous tissue: Secondary | ICD-10-CM | POA: Diagnosis not present

## 2017-01-05 DIAGNOSIS — I4510 Unspecified right bundle-branch block: Secondary | ICD-10-CM | POA: Diagnosis not present

## 2017-01-05 DIAGNOSIS — N183 Chronic kidney disease, stage 3 (moderate): Secondary | ICD-10-CM | POA: Diagnosis not present

## 2017-01-05 DIAGNOSIS — I130 Hypertensive heart and chronic kidney disease with heart failure and stage 1 through stage 4 chronic kidney disease, or unspecified chronic kidney disease: Secondary | ICD-10-CM | POA: Diagnosis not present

## 2017-01-05 DIAGNOSIS — I255 Ischemic cardiomyopathy: Secondary | ICD-10-CM | POA: Diagnosis not present

## 2017-01-05 DIAGNOSIS — I2510 Atherosclerotic heart disease of native coronary artery without angina pectoris: Secondary | ICD-10-CM | POA: Diagnosis not present

## 2017-01-05 NOTE — Patient Outreach (Signed)
[facility]) Care Management  01/05/2017  FRANCE NOYCE [birth year] [id number]  Late entry for 01/04/17.    Successful outreach to patient to follow-up on medication needs. Patient reports his physical therapist was there at time of call and requested a call back later.   Plan:  Will call patient back later per his request.    Karrie Meres, PharmD, [place] [phone]

## 2017-01-05 NOTE — Patient Outreach (Signed)
[facility]) Care Management  01/05/2017  JEMEL ONO [birth year] [id number]  Attempted to reach patient again----there was no answer on phone and was unable to leave a voice message.   Plan:  Will make another outreach attempt next week to patient.   Karrie Meres, PharmD, [place] [phone]

## 2017-01-06 ENCOUNTER — Other Ambulatory Visit: Payer: Self-pay

## 2017-01-06 NOTE — Patient Outreach (Signed)
[place] [facility]) Care Management   01/06/17  Patrick [name] May 02, 1951 [id number]  Successful outreach completed with patient. Patient identification verified.  Patient stated that he has been doing ok.   He reported that he thinks he has one more week of home health therapy. His home health nurse still coming out for wound care. He stated that they are coming twice a week. Reported they have told him it is looking much better. He stated that it started out at a level 3 and is not at a level 1 and looks even better this week. Home health nurses helped the first time, but he caught on and has been managing it on his own. Only problem he reported is that he has developed a hernia where they did the surgery to do the stoma and the bag does not have enough sticky area to stay on well. He stated that his home health nurse ordered him crescent moon shaped adhesives that will go on the outside sticky area on bag and make area bigger so it will stick. He stated that his PCP and surgeon are aware of the hernia.  Patient stated that he "always has pain." He stated that currently it is not too bad. He stated that his right knee is about a 3 and his left knee is about a 1. He stated that his right hip is about a 3. These are all chronic pains that he reports that he experiences. Patient stated that his right knee is really bothering him. He stated that he has had problems with his left foot around the arch that has been giving him a hard time this week. Otherwise, he stated that everything is better today.   Patient stated that he has received his scales. Call was ended by patient before could go further into discussing weights because his sister was calling him.   Plan: Will continue to follow patient for transition of care.

## 2017-01-09 ENCOUNTER — Encounter: Payer: Self-pay | Admitting: Internal Medicine

## 2017-01-09 ENCOUNTER — Other Ambulatory Visit: Payer: Self-pay | Admitting: Pharmacist

## 2017-01-09 ENCOUNTER — Telehealth: Payer: Self-pay | Admitting: Psychology

## 2017-01-09 ENCOUNTER — Encounter: Payer: Self-pay | Admitting: Psychology

## 2017-01-09 ENCOUNTER — Ambulatory Visit (INDEPENDENT_AMBULATORY_CARE_PROVIDER_SITE_OTHER): Payer: Medicare Other | Admitting: Internal Medicine

## 2017-01-09 VITALS — BP 122/70 | HR 64 | Temp 98.0°F | Wt 203.0 lb

## 2017-01-09 DIAGNOSIS — I4891 Unspecified atrial fibrillation: Secondary | ICD-10-CM | POA: Diagnosis not present

## 2017-01-09 DIAGNOSIS — I2510 Atherosclerotic heart disease of native coronary artery without angina pectoris: Secondary | ICD-10-CM | POA: Diagnosis not present

## 2017-01-09 DIAGNOSIS — I130 Hypertensive heart and chronic kidney disease with heart failure and stage 1 through stage 4 chronic kidney disease, or unspecified chronic kidney disease: Secondary | ICD-10-CM | POA: Diagnosis not present

## 2017-01-09 DIAGNOSIS — I5042 Chronic combined systolic (congestive) and diastolic (congestive) heart failure: Secondary | ICD-10-CM | POA: Diagnosis not present

## 2017-01-09 DIAGNOSIS — C20 Malignant neoplasm of rectum: Secondary | ICD-10-CM | POA: Diagnosis not present

## 2017-01-09 DIAGNOSIS — I272 Pulmonary hypertension, unspecified: Secondary | ICD-10-CM | POA: Diagnosis not present

## 2017-01-09 DIAGNOSIS — F329 Major depressive disorder, single episode, unspecified: Secondary | ICD-10-CM

## 2017-01-09 DIAGNOSIS — F32A Depression, unspecified: Secondary | ICD-10-CM

## 2017-01-09 DIAGNOSIS — N183 Chronic kidney disease, stage 3 (moderate): Secondary | ICD-10-CM | POA: Diagnosis not present

## 2017-01-09 DIAGNOSIS — I255 Ischemic cardiomyopathy: Secondary | ICD-10-CM | POA: Diagnosis not present

## 2017-01-09 DIAGNOSIS — I5022 Chronic systolic (congestive) heart failure: Secondary | ICD-10-CM

## 2017-01-09 DIAGNOSIS — Z433 Encounter for attention to colostomy: Secondary | ICD-10-CM | POA: Diagnosis not present

## 2017-01-09 DIAGNOSIS — L988 Other specified disorders of the skin and subcutaneous tissue: Secondary | ICD-10-CM | POA: Diagnosis not present

## 2017-01-09 DIAGNOSIS — I4510 Unspecified right bundle-branch block: Secondary | ICD-10-CM | POA: Diagnosis not present

## 2017-01-09 DIAGNOSIS — D631 Anemia in chronic kidney disease: Secondary | ICD-10-CM | POA: Diagnosis not present

## 2017-01-09 MED ORDER — BUPROPION HCL 100 MG PO TABS: 100.0000 mg | ORAL_TABLET | Freq: Two times a day (BID) | ORAL | 0 refills | Status: DC

## 2017-01-09 NOTE — Patient Outreach (Signed)
[facility]) Care Management  01/09/2017  Patrick [name] [birth year] [id number]  Second outreach attempt to patient---he answered call and before HIPAA details could be verified stated it was a bad time as he was finishing up at a doctor's office.  He states tomorrow is better.   Plan:  Will attempt to reach patient later per his request.   Karrie Meres, PharmD, [place] [phone]

## 2017-01-09 NOTE — Progress Notes (Signed)
   [facility] Phone: [phone]  Subjective:  Patrick [name] is a 66 year old male presenting to clinic for follow-up of his depression and to discuss weight gain.  Depression: Still feeling depressed. States he had suicidal ideations for one day when he was in the hospital, but he has not any other thoughts like this since then. Thinks his depression is caused by all the health problems he has gone through in the last few months. He states he feels sad that he will have the colostomy bag for the rest of his life. He may also always have the urinary catheter too. He is also stressed out about his sister returning home from the nursing home. He is her primary caretaker and performs all of her ADLs for her. At our last visit, we increased his Remeron from 15mg  to 30mg  daily. He states "this has helped very little, if any". He still endorses poor appetite. He also states that he does not want to get out of bed in the morning. No current SI, HI. Also taking Trazodone 300mg  at night.  Weight Gain: Has gained 15lbs in the last 3 weeks. Before his hospitalization, he was taking Torsemide 80mg  in the morning and 40mg  in the evening. On discharge from the hospital, he was told to take Torsemide 40mg  daily. He states he sees his heart doctor in 2-3 weeks. He endorses occasional shortness of breath. He also endorses worsening lower extremity edema. He also notes that he has not been urinating as much, even with the urinary catheter in place. No increase in salt consumption.  ROS: See HPI for pertinent positives and negatives  Past Medical History- HTN, ischemic cardiomyopathy, chronic systolic heart failure, A-fib, CAD, rectal cancer s/p chemo and radiation, CKD III, macular degeneration, HLD, obesity, iron deficiency anemia, major depression.  Family history reviewed for today's visit. No changes.  Social history- patient is a former smoker  Objective: Wt 203 lb (92.1 kg)   BMI 29.98 kg/m   Gen: NAD, alert, cooperative with exam HEENT: NCAT, EOMI, MMM Neck: FROM, supple, no JVD CV: RRR, no murmur Resp: CTABL, no wheezes, normal work of breathing, no crackles Msk: 1+ pitting edema in the feet/ankles bilaterally Psych: Normal thought content, normal judgment, normal rate of speech, normal affect, smiles occasionally  PHQ-9: 14  Assessment/Plan: Depression: Uncontrolled. Taking Remeron 30mg  daily and Trazodone 300mg  at bedtime. PHQ-9 is a 14. - Add Wellbutrin 100mg  bid. Will titrate up as appropriate. - Can potentially stop Remeron down the road if his appetite improves, since it is not helping his depression at all.  - Behavioral health consultant Larene Beach Adcock) saw patient in clinic today and will continue to follow-up with him. Please see her separate note. - Follow-up with me in 6 weeks.  Weight Gain: Has gained 15lbs in 3 weeks. Now with symmetric lower extremity edema, so does appear mildly fluid overloaded today. No crackles or JVD. Was previously taking Torsemide 80mg  in the morning and 40mg  in the evening before his surgery, but was discharged from the hospital on Torsemide 40mg  daily.  - Increase Torsemide from 40mg  daily to 40mg  bid.  - Recheck BMP in 1 week - Patient has appointment with cardiologist in 2-3 weeks. - Follow-up in 6 weeks or earlier if needed - If his fluid status improves, but his weight continues to go up, could consider stopping Remeron.   Hyman Bible, MD PGY-3

## 2017-01-09 NOTE — Progress Notes (Signed)
Dr. Brett Albino requested a [place].   Presenting Issue:  Reason is suffering from depression and anxiety. His recent cancer diagnosis is very distressing. He walks with a cane and has a colostomy bag. He is finding it hard to get out of bed in the morning. He has no SI.  Report of symptoms:  Darlene is feeling very depressed. He is having a hard time getting out of bed.   Duration of CURRENT symptoms:  The patient has been depressed since his cancer diagnosis in September of last year.  Age of onset of first mood disturbance:  September of last year.  Impact on function:  The patient is having a hard time getting out of bed.  Psychiatric History - Diagnoses: n/a - Hospitalizations: n/a - Pharmacotherapy: Dr. Brett Albino prescribed Wellbutrin. He has taken mirtazapine in the past. He has also taken trazadone. - Outpatient therapy: n/a  Family history of psychiatric issues:  n/a  Current and history of substance use:  n/a  Medical conditions that might explain or contribute to symptoms:  The patient has been dealing with cancer since September of last year. He has been through surgery and has a colostomy bag.   PHQ-9:  14 (moderate) GAD-7:  11 (moderat) MDQ (if indicated):  N/A  Assessment / Plan / Recommendations:We discussed using behavioral activation to help with depression. Niall likes to read the paper and watch TV. He also enjoys playing with his cat. He is learning to accept his physical limitations. He was given a worksheet on behavioral activation to track his activities. He will return on September 10 at 2:30.   Warmhandoff complete.

## 2017-01-09 NOTE — Patient Instructions (Addendum)
It was so nice to see you! I am excited for [facility] to come home!  I have started a medication called Wellbutrin (Buproprion). Please take 1 tablet twice a day.  Let's increase your Torsemide to 40mg  (2 tablets) twice a day. We will recheck your blood work in 1 week. I have already placed an order for this.  Please come back to see me in 6 weeks!  -Dr. Brett Albino

## 2017-01-09 NOTE — Assessment & Plan Note (Signed)
Assessment / Plan / Recommendations:Patrick [name] is feeling very depressed (PHQ9=14). We discussed using behavioral activation to help with his depression. Patrick [name] likes to read the paper and watch TV. He also enjoys playing with his cat. He is learning to accept his physical limitations. He was given a worksheet on behavioral activation to track his activities. He will return on September 10 at 2:30.

## 2017-01-09 NOTE — Telephone Encounter (Signed)
I left a message for patient rescheduling his appointment for 10:30 on February 06, 2017

## 2017-01-10 ENCOUNTER — Other Ambulatory Visit: Payer: Self-pay | Admitting: Pharmacist

## 2017-01-10 DIAGNOSIS — I130 Hypertensive heart and chronic kidney disease with heart failure and stage 1 through stage 4 chronic kidney disease, or unspecified chronic kidney disease: Secondary | ICD-10-CM | POA: Diagnosis not present

## 2017-01-10 DIAGNOSIS — I255 Ischemic cardiomyopathy: Secondary | ICD-10-CM | POA: Diagnosis not present

## 2017-01-10 DIAGNOSIS — D631 Anemia in chronic kidney disease: Secondary | ICD-10-CM | POA: Diagnosis not present

## 2017-01-10 DIAGNOSIS — I272 Pulmonary hypertension, unspecified: Secondary | ICD-10-CM | POA: Diagnosis not present

## 2017-01-10 DIAGNOSIS — I4891 Unspecified atrial fibrillation: Secondary | ICD-10-CM | POA: Diagnosis not present

## 2017-01-10 DIAGNOSIS — I5042 Chronic combined systolic (congestive) and diastolic (congestive) heart failure: Secondary | ICD-10-CM | POA: Diagnosis not present

## 2017-01-10 DIAGNOSIS — I4510 Unspecified right bundle-branch block: Secondary | ICD-10-CM | POA: Diagnosis not present

## 2017-01-10 DIAGNOSIS — C20 Malignant neoplasm of rectum: Secondary | ICD-10-CM | POA: Diagnosis not present

## 2017-01-10 DIAGNOSIS — L988 Other specified disorders of the skin and subcutaneous tissue: Secondary | ICD-10-CM | POA: Diagnosis not present

## 2017-01-10 DIAGNOSIS — N183 Chronic kidney disease, stage 3 (moderate): Secondary | ICD-10-CM | POA: Diagnosis not present

## 2017-01-10 DIAGNOSIS — I2510 Atherosclerotic heart disease of native coronary artery without angina pectoris: Secondary | ICD-10-CM | POA: Diagnosis not present

## 2017-01-10 DIAGNOSIS — Z433 Encounter for attention to colostomy: Secondary | ICD-10-CM | POA: Diagnosis not present

## 2017-01-10 NOTE — Patient Outreach (Signed)
[facility]) Care Management  01/10/2017  OREE HISLOP 11-23-1950 [id number]  Attempted to reach patient today---no answer on cell phone, HIPAA compliant message left.  Gentleman answered home number, no PHI exchanged, he stated patient was not available, HIPAA compliant message left requesting return call.   Plan:  If no return call, will make outreach attempt to patient next week.   Karrie Meres, PharmD, [place] [phone]

## 2017-01-11 DIAGNOSIS — I130 Hypertensive heart and chronic kidney disease with heart failure and stage 1 through stage 4 chronic kidney disease, or unspecified chronic kidney disease: Secondary | ICD-10-CM | POA: Diagnosis not present

## 2017-01-11 DIAGNOSIS — I272 Pulmonary hypertension, unspecified: Secondary | ICD-10-CM | POA: Diagnosis not present

## 2017-01-11 DIAGNOSIS — C20 Malignant neoplasm of rectum: Secondary | ICD-10-CM | POA: Diagnosis not present

## 2017-01-11 DIAGNOSIS — I255 Ischemic cardiomyopathy: Secondary | ICD-10-CM | POA: Diagnosis not present

## 2017-01-11 DIAGNOSIS — Z433 Encounter for attention to colostomy: Secondary | ICD-10-CM | POA: Diagnosis not present

## 2017-01-11 DIAGNOSIS — I4510 Unspecified right bundle-branch block: Secondary | ICD-10-CM | POA: Diagnosis not present

## 2017-01-11 DIAGNOSIS — N183 Chronic kidney disease, stage 3 (moderate): Secondary | ICD-10-CM | POA: Diagnosis not present

## 2017-01-11 DIAGNOSIS — L988 Other specified disorders of the skin and subcutaneous tissue: Secondary | ICD-10-CM | POA: Diagnosis not present

## 2017-01-11 DIAGNOSIS — I2510 Atherosclerotic heart disease of native coronary artery without angina pectoris: Secondary | ICD-10-CM | POA: Diagnosis not present

## 2017-01-11 DIAGNOSIS — I4891 Unspecified atrial fibrillation: Secondary | ICD-10-CM | POA: Diagnosis not present

## 2017-01-11 DIAGNOSIS — D631 Anemia in chronic kidney disease: Secondary | ICD-10-CM | POA: Diagnosis not present

## 2017-01-11 DIAGNOSIS — I5042 Chronic combined systolic (congestive) and diastolic (congestive) heart failure: Secondary | ICD-10-CM | POA: Diagnosis not present

## 2017-01-11 NOTE — Assessment & Plan Note (Signed)
Has gained 15lbs in 3 weeks. Now with symmetric lower extremity edema, so does appear mildly fluid overloaded today. No crackles or JVD. Was previously taking Torsemide 80mg  in the morning and 40mg  in the evening before his surgery, but was discharged from the hospital on Torsemide 40mg  daily.  - Increase Torsemide from 40mg  daily to 40mg  bid.  - Recheck BMP in 1 week - Patient has appointment with cardiologist in 2-3 weeks. - Follow-up in 6 weeks or earlier if needed - If his fluid status improves, but his weight continues to go up, could consider stopping Remeron.

## 2017-01-11 NOTE — Assessment & Plan Note (Signed)
Uncontrolled. Taking Remeron 30mg  daily and Trazodone 300mg  at bedtime. PHQ-9 is a 14. - Add Wellbutrin 100mg  bid. Will titrate up as appropriate. - Can potentially stop Remeron down the road if his appetite improves, since it is not helping his depression at all.  - Behavioral health consultant Larene Beach Adcock) saw patient in clinic today and will continue to follow-up with him. Please see her separate note. - Follow-up with me in 6 weeks.

## 2017-01-13 DIAGNOSIS — I4891 Unspecified atrial fibrillation: Secondary | ICD-10-CM | POA: Diagnosis not present

## 2017-01-13 DIAGNOSIS — N183 Chronic kidney disease, stage 3 (moderate): Secondary | ICD-10-CM | POA: Diagnosis not present

## 2017-01-13 DIAGNOSIS — Z433 Encounter for attention to colostomy: Secondary | ICD-10-CM | POA: Diagnosis not present

## 2017-01-13 DIAGNOSIS — C20 Malignant neoplasm of rectum: Secondary | ICD-10-CM | POA: Diagnosis not present

## 2017-01-13 DIAGNOSIS — I255 Ischemic cardiomyopathy: Secondary | ICD-10-CM | POA: Diagnosis not present

## 2017-01-13 DIAGNOSIS — I5042 Chronic combined systolic (congestive) and diastolic (congestive) heart failure: Secondary | ICD-10-CM | POA: Diagnosis not present

## 2017-01-13 DIAGNOSIS — D631 Anemia in chronic kidney disease: Secondary | ICD-10-CM | POA: Diagnosis not present

## 2017-01-13 DIAGNOSIS — L988 Other specified disorders of the skin and subcutaneous tissue: Secondary | ICD-10-CM | POA: Diagnosis not present

## 2017-01-13 DIAGNOSIS — I130 Hypertensive heart and chronic kidney disease with heart failure and stage 1 through stage 4 chronic kidney disease, or unspecified chronic kidney disease: Secondary | ICD-10-CM | POA: Diagnosis not present

## 2017-01-13 DIAGNOSIS — I272 Pulmonary hypertension, unspecified: Secondary | ICD-10-CM | POA: Diagnosis not present

## 2017-01-13 DIAGNOSIS — I2510 Atherosclerotic heart disease of native coronary artery without angina pectoris: Secondary | ICD-10-CM | POA: Diagnosis not present

## 2017-01-13 DIAGNOSIS — I4510 Unspecified right bundle-branch block: Secondary | ICD-10-CM | POA: Diagnosis not present

## 2017-01-16 ENCOUNTER — Other Ambulatory Visit: Payer: Self-pay | Admitting: Pharmacist

## 2017-01-16 DIAGNOSIS — I5042 Chronic combined systolic (congestive) and diastolic (congestive) heart failure: Secondary | ICD-10-CM | POA: Diagnosis not present

## 2017-01-16 DIAGNOSIS — Z433 Encounter for attention to colostomy: Secondary | ICD-10-CM | POA: Diagnosis not present

## 2017-01-16 DIAGNOSIS — I130 Hypertensive heart and chronic kidney disease with heart failure and stage 1 through stage 4 chronic kidney disease, or unspecified chronic kidney disease: Secondary | ICD-10-CM | POA: Diagnosis not present

## 2017-01-16 DIAGNOSIS — I2510 Atherosclerotic heart disease of native coronary artery without angina pectoris: Secondary | ICD-10-CM | POA: Diagnosis not present

## 2017-01-16 DIAGNOSIS — I272 Pulmonary hypertension, unspecified: Secondary | ICD-10-CM | POA: Diagnosis not present

## 2017-01-16 DIAGNOSIS — I4891 Unspecified atrial fibrillation: Secondary | ICD-10-CM | POA: Diagnosis not present

## 2017-01-16 DIAGNOSIS — I255 Ischemic cardiomyopathy: Secondary | ICD-10-CM | POA: Diagnosis not present

## 2017-01-16 DIAGNOSIS — L988 Other specified disorders of the skin and subcutaneous tissue: Secondary | ICD-10-CM | POA: Diagnosis not present

## 2017-01-16 DIAGNOSIS — I4510 Unspecified right bundle-branch block: Secondary | ICD-10-CM | POA: Diagnosis not present

## 2017-01-16 DIAGNOSIS — D631 Anemia in chronic kidney disease: Secondary | ICD-10-CM | POA: Diagnosis not present

## 2017-01-16 DIAGNOSIS — N183 Chronic kidney disease, stage 3 (moderate): Secondary | ICD-10-CM | POA: Diagnosis not present

## 2017-01-16 DIAGNOSIS — C20 Malignant neoplasm of rectum: Secondary | ICD-10-CM | POA: Diagnosis not present

## 2017-01-16 NOTE — Patient Outreach (Signed)
[facility]) Care Management  01/16/2017  SILVIANO NEUSER 26-Oct-1950 [id number]  Attempted to follow-up with patient today regarding pharmacy needs.  Patient answered call, and before HIPAA details could be asked, he reported it was a bad time and requested a call back a different day in the afternoon.   Plan:  Will make an outreach attempt in the afternoon later this week.   Karrie Meres, PharmD, [place] [phone]

## 2017-01-18 ENCOUNTER — Other Ambulatory Visit: Payer: Self-pay | Admitting: Pharmacist

## 2017-01-18 DIAGNOSIS — I130 Hypertensive heart and chronic kidney disease with heart failure and stage 1 through stage 4 chronic kidney disease, or unspecified chronic kidney disease: Secondary | ICD-10-CM | POA: Diagnosis not present

## 2017-01-18 DIAGNOSIS — I4510 Unspecified right bundle-branch block: Secondary | ICD-10-CM | POA: Diagnosis not present

## 2017-01-18 DIAGNOSIS — D631 Anemia in chronic kidney disease: Secondary | ICD-10-CM | POA: Diagnosis not present

## 2017-01-18 DIAGNOSIS — I255 Ischemic cardiomyopathy: Secondary | ICD-10-CM | POA: Diagnosis not present

## 2017-01-18 DIAGNOSIS — C20 Malignant neoplasm of rectum: Secondary | ICD-10-CM | POA: Diagnosis not present

## 2017-01-18 DIAGNOSIS — I4891 Unspecified atrial fibrillation: Secondary | ICD-10-CM | POA: Diagnosis not present

## 2017-01-18 DIAGNOSIS — I272 Pulmonary hypertension, unspecified: Secondary | ICD-10-CM | POA: Diagnosis not present

## 2017-01-18 DIAGNOSIS — I2510 Atherosclerotic heart disease of native coronary artery without angina pectoris: Secondary | ICD-10-CM | POA: Diagnosis not present

## 2017-01-18 DIAGNOSIS — Z433 Encounter for attention to colostomy: Secondary | ICD-10-CM | POA: Diagnosis not present

## 2017-01-18 DIAGNOSIS — N183 Chronic kidney disease, stage 3 (moderate): Secondary | ICD-10-CM | POA: Diagnosis not present

## 2017-01-18 DIAGNOSIS — L988 Other specified disorders of the skin and subcutaneous tissue: Secondary | ICD-10-CM | POA: Diagnosis not present

## 2017-01-18 DIAGNOSIS — I5042 Chronic combined systolic (congestive) and diastolic (congestive) heart failure: Secondary | ICD-10-CM | POA: Diagnosis not present

## 2017-01-18 NOTE — Patient Outreach (Signed)
[facility]) Care Management  01/18/2017  Patrick [name] December 28, 1950 [id number]  Unsuccessful phone outreach to patient.  HIPAA compliant message left requesting return call.   Plan:  If no return call, will make second outreach attempt in the next week.   Karrie Meres, PharmD, [place] [phone]

## 2017-01-19 ENCOUNTER — Telehealth: Payer: Self-pay | Admitting: *Deleted

## 2017-01-19 ENCOUNTER — Other Ambulatory Visit: Payer: Self-pay | Admitting: Pharmacist

## 2017-01-19 ENCOUNTER — Other Ambulatory Visit: Payer: Self-pay

## 2017-01-19 DIAGNOSIS — I4510 Unspecified right bundle-branch block: Secondary | ICD-10-CM | POA: Diagnosis not present

## 2017-01-19 DIAGNOSIS — D631 Anemia in chronic kidney disease: Secondary | ICD-10-CM | POA: Diagnosis not present

## 2017-01-19 DIAGNOSIS — N183 Chronic kidney disease, stage 3 (moderate): Secondary | ICD-10-CM | POA: Diagnosis not present

## 2017-01-19 DIAGNOSIS — Z433 Encounter for attention to colostomy: Secondary | ICD-10-CM | POA: Diagnosis not present

## 2017-01-19 DIAGNOSIS — I5042 Chronic combined systolic (congestive) and diastolic (congestive) heart failure: Secondary | ICD-10-CM | POA: Diagnosis not present

## 2017-01-19 DIAGNOSIS — I255 Ischemic cardiomyopathy: Secondary | ICD-10-CM | POA: Diagnosis not present

## 2017-01-19 DIAGNOSIS — I130 Hypertensive heart and chronic kidney disease with heart failure and stage 1 through stage 4 chronic kidney disease, or unspecified chronic kidney disease: Secondary | ICD-10-CM | POA: Diagnosis not present

## 2017-01-19 DIAGNOSIS — I4891 Unspecified atrial fibrillation: Secondary | ICD-10-CM | POA: Diagnosis not present

## 2017-01-19 DIAGNOSIS — I272 Pulmonary hypertension, unspecified: Secondary | ICD-10-CM | POA: Diagnosis not present

## 2017-01-19 DIAGNOSIS — I2510 Atherosclerotic heart disease of native coronary artery without angina pectoris: Secondary | ICD-10-CM | POA: Diagnosis not present

## 2017-01-19 DIAGNOSIS — L988 Other specified disorders of the skin and subcutaneous tissue: Secondary | ICD-10-CM | POA: Diagnosis not present

## 2017-01-19 DIAGNOSIS — C20 Malignant neoplasm of rectum: Secondary | ICD-10-CM | POA: Diagnosis not present

## 2017-01-19 NOTE — Patient Outreach (Signed)
[facility]) Care Management  01/19/2017  Patrick [name] January 21, 1951 [id number]  [facility] Pharmacist has made multiple unsuccessful attempts to reach patient.  Received in-basket message today from [place] that patient reports no pharmacy needs.   Plan:  Will close patient's pharmacy episode as he declined pharmacy needs to [place].    Karrie Meres, PharmD, [place] [phone]   Loletta Specter, RN  [name], [place] Iha, [facility]        Good morning [name],  I spoke with [name] today. I see you are still struggling to get him on the phone. He has always answered my calls, so it started to look like he was avoiding talking with you. I discussed with him the purpose of your calls and he said he did not have any pharmacy needs and that you can close his referral. I will put that in my note and you can feel free to close him if you choose. I hated to see you putting in so much effort when it appeared he was not really interested. I will follow up with you if I find any needs.   [place]

## 2017-01-19 NOTE — Patient Outreach (Signed)
[facility]) Care Management  01/19/17  Patrick [name] 1951/05/23 [id number]  Successful outreach completed with patient. Patient identification verified.  Patient sounded quiet, or as if he had just woken up when on the phone with [facility] and he stated that he had just had a rough night. He stated that his sister returned home on 01/09/2017 and she has been sleeping in her recliner because she feels it is more comfortable than her bed. He stated that last night, her leg fell off the recliner (in the reclined position) 3 times, and each time, she was in excruciating pain and it kept him up.  Patient stated otherwise he has been doing so/so. He stated that his strength is still not back and his legs are weak. He also reported that his appetite has not been that great. He stated, "I [name] know if I will ever be back close to where I was." Patient reported that his depression is not much better, but that he is going to see a therapist in a couple of weeks. RNCM provided education about the benefits of therapy with his diagnosis of depression as well as his new health concerns to assist him with coping strategies. Patient verbalized understanding. RNCM assessed if there are any concerns since his sister has been discharged home and patient reported that he is "glad she is home, but it is added work for me." RNCM encouraged him to use caution when providing care. Patient is the primary caregiver for his sister, who he reported is morbidly obese and dependent on him for all of her ADLs and IADLs, RNCM will need to monitor this closely to ensure patient does not have adverse outcomes with his own care and recovery with his sister being home.  RNCM also talked with patient regarding [facility] pharmacist referral and the pharmacist making several attempts to reach him without success. He stated he was aware [name] had been calling him and he just did not feel he needed to talk with him. He stated  that he has no copay for all of his medications and he does not have any pharmacy concerns. He stated he does ot need additional outreach from pharmacist and RNCM advised will let him know.  Patient has no other questions or concerns at present. He stated that he has no concerns related to his colostomy and continues to change his bags with no problems. RNCM to follow up with patient in a couple of weeks and he was agreeable. [name], RN, BSN, CCM [facility] Care Management Coordinator [phone]   Same chronic pain that  you have had  Depression is not much better if any, will see a therapist in a couple of weeks I am glad she is home, but it is added work on me

## 2017-01-19 NOTE — Telephone Encounter (Signed)
Patrick [name] from [place] at home is calling for verbal orders. She needs the following:  Orders for wound care 2 times a week and 2 visits as needed. Change the wound care to [place] strips but to continue with foam dressing.  Patrick [name] states that the wound has stalled.  You can leave a detailed message on Patrick [name]'s secure VM  Patrick [name], Patrick [name], Patrick [name]

## 2017-01-20 ENCOUNTER — Ambulatory Visit: Payer: Self-pay | Admitting: Pharmacist

## 2017-01-20 DIAGNOSIS — Z433 Encounter for attention to colostomy: Secondary | ICD-10-CM | POA: Diagnosis not present

## 2017-01-20 DIAGNOSIS — I130 Hypertensive heart and chronic kidney disease with heart failure and stage 1 through stage 4 chronic kidney disease, or unspecified chronic kidney disease: Secondary | ICD-10-CM | POA: Diagnosis not present

## 2017-01-20 DIAGNOSIS — I2510 Atherosclerotic heart disease of native coronary artery without angina pectoris: Secondary | ICD-10-CM | POA: Diagnosis not present

## 2017-01-20 DIAGNOSIS — C20 Malignant neoplasm of rectum: Secondary | ICD-10-CM | POA: Diagnosis not present

## 2017-01-20 DIAGNOSIS — I5042 Chronic combined systolic (congestive) and diastolic (congestive) heart failure: Secondary | ICD-10-CM | POA: Diagnosis not present

## 2017-01-20 DIAGNOSIS — D631 Anemia in chronic kidney disease: Secondary | ICD-10-CM | POA: Diagnosis not present

## 2017-01-20 DIAGNOSIS — I272 Pulmonary hypertension, unspecified: Secondary | ICD-10-CM | POA: Diagnosis not present

## 2017-01-20 DIAGNOSIS — I4891 Unspecified atrial fibrillation: Secondary | ICD-10-CM | POA: Diagnosis not present

## 2017-01-20 DIAGNOSIS — I255 Ischemic cardiomyopathy: Secondary | ICD-10-CM | POA: Diagnosis not present

## 2017-01-20 DIAGNOSIS — N183 Chronic kidney disease, stage 3 (moderate): Secondary | ICD-10-CM | POA: Diagnosis not present

## 2017-01-20 DIAGNOSIS — L988 Other specified disorders of the skin and subcutaneous tissue: Secondary | ICD-10-CM | POA: Diagnosis not present

## 2017-01-20 DIAGNOSIS — I4510 Unspecified right bundle-branch block: Secondary | ICD-10-CM | POA: Diagnosis not present

## 2017-01-20 NOTE — Telephone Encounter (Signed)
Okay to give verbal orders?   Thanks!   

## 2017-01-20 NOTE — Telephone Encounter (Signed)
Patrick [name] contacted and verbal orders given for [employer].

## 2017-01-24 DIAGNOSIS — I4510 Unspecified right bundle-branch block: Secondary | ICD-10-CM | POA: Diagnosis not present

## 2017-01-24 DIAGNOSIS — I255 Ischemic cardiomyopathy: Secondary | ICD-10-CM | POA: Diagnosis not present

## 2017-01-24 DIAGNOSIS — I130 Hypertensive heart and chronic kidney disease with heart failure and stage 1 through stage 4 chronic kidney disease, or unspecified chronic kidney disease: Secondary | ICD-10-CM | POA: Diagnosis not present

## 2017-01-24 DIAGNOSIS — N183 Chronic kidney disease, stage 3 (moderate): Secondary | ICD-10-CM | POA: Diagnosis not present

## 2017-01-24 DIAGNOSIS — L988 Other specified disorders of the skin and subcutaneous tissue: Secondary | ICD-10-CM | POA: Diagnosis not present

## 2017-01-24 DIAGNOSIS — E43 Unspecified severe protein-calorie malnutrition: Secondary | ICD-10-CM | POA: Diagnosis not present

## 2017-01-24 DIAGNOSIS — I4891 Unspecified atrial fibrillation: Secondary | ICD-10-CM | POA: Diagnosis not present

## 2017-01-24 DIAGNOSIS — I2510 Atherosclerotic heart disease of native coronary artery without angina pectoris: Secondary | ICD-10-CM | POA: Diagnosis not present

## 2017-01-24 DIAGNOSIS — I272 Pulmonary hypertension, unspecified: Secondary | ICD-10-CM | POA: Diagnosis not present

## 2017-01-24 DIAGNOSIS — I5042 Chronic combined systolic (congestive) and diastolic (congestive) heart failure: Secondary | ICD-10-CM | POA: Diagnosis not present

## 2017-01-24 DIAGNOSIS — Z933 Colostomy status: Secondary | ICD-10-CM | POA: Diagnosis not present

## 2017-01-24 DIAGNOSIS — R338 Other retention of urine: Secondary | ICD-10-CM | POA: Diagnosis not present

## 2017-01-24 DIAGNOSIS — K435 Parastomal hernia without obstruction or  gangrene: Secondary | ICD-10-CM | POA: Diagnosis not present

## 2017-01-24 DIAGNOSIS — C20 Malignant neoplasm of rectum: Secondary | ICD-10-CM | POA: Diagnosis not present

## 2017-01-24 DIAGNOSIS — Z433 Encounter for attention to colostomy: Secondary | ICD-10-CM | POA: Diagnosis not present

## 2017-01-24 DIAGNOSIS — D631 Anemia in chronic kidney disease: Secondary | ICD-10-CM | POA: Diagnosis not present

## 2017-01-25 ENCOUNTER — Other Ambulatory Visit: Payer: Self-pay | Admitting: Interventional Radiology

## 2017-01-25 ENCOUNTER — Telehealth: Payer: Self-pay | Admitting: Internal Medicine

## 2017-01-25 DIAGNOSIS — N2889 Other specified disorders of kidney and ureter: Secondary | ICD-10-CM

## 2017-01-25 NOTE — Progress Notes (Signed)
HPI The patient presents for follow-up CAD.  On cardiac cath in Sept 2017 he had subtotal occlusion of the small codominant right coronary. He had a borderline lesion in a moderate size first marginal. He had a severe cardiomyopathy with an ejection fraction of 20%. He did have mild to moderate pulmonary venous hypertension and  normal cardiac output.  His echo in Dec demonstrated his EF to be somewhat improved at 30 - 35%.  He was also found to be in atrial fib.  He is being managed for rectal cancer.  He was seen in Feb for preop evaluation.   However, his surgery was delayed.   He was hospitalized twice.  In early May he had resection of his cancer with permanent colostomy.  He was in the hospital again a few weeks later with AKI.  Metolzaone, losartan and torsemide were held.  He had mild troponin elevation.  At discharge the torsemide was restarted.   He returns for follow up. He is doing OK.  The patient denies any new symptoms such as chest discomfort, neck or arm discomfort. There has been no new shortness of breath, PND or orthopnea. There have been no reported palpitations, presyncope or syncope.  He is at home and gets around with a cane.  He denies falls.  He has had no bleeding issues. I do see that his Hgb was stable recently .     Allergies  Allergen Reactions  . Nsaids     Kidney disease    Current Outpatient Prescriptions  Medication Sig Dispense Refill  . acetaminophen (TYLENOL) 500 MG tablet Take 1,000 mg by mouth every 6 (six) hours as needed (for pain/fever/headaches.).     Marland Kitchen allopurinol (ZYLOPRIM) 300 MG tablet Take 1 tablet (300 mg total) by mouth daily. 90 tablet 0  . Ascorbic Acid (VITAMIN C) 1000 MG tablet Take 2,000 mg by mouth 2 (two) times daily.    Marland Kitchen buPROPion (WELLBUTRIN) 100 MG tablet Take 1 tablet (100 mg total) by mouth 2 (two) times daily. 120 tablet 0  . carvedilol (COREG) 25 MG tablet Take 1 tablet (25 mg total) by mouth 2 (two) times daily. 180 tablet 3  .  colchicine 0.6 MG tablet Take 0.6 mg by mouth 2 (two) times daily as needed.    . ferrous sulfate 325 (65 FE) MG tablet Take 1 tablet (325 mg total) by mouth 2 (two) times daily with a meal. 180 tablet 0  . finasteride (PROSCAR) 5 MG tablet take 1 tablet by mouth once daily 90 tablet 1  . losartan (COZAAR) 50 MG tablet Take 1 tablet (50 mg total) by mouth daily. 90 tablet 3  . lovastatin (MEVACOR) 20 MG tablet Take 1 tablet (20 mg total) by mouth at bedtime. 90 tablet 1  . methocarbamol (ROBAXIN) 750 MG tablet Take 1 tablet (750 mg total) by mouth 4 (four) times daily as needed (use for muscle cramps/pain). 30 tablet 2  . mirtazapine (REMERON) 30 MG tablet Take 1 tablet (30 mg total) by mouth at bedtime. 30 tablet 1  . Multiple Vitamins-Minerals (MULTIVITAMIN WITH MINERALS) tablet Take 1 tablet by mouth daily. Men's 50+ Multivitamin    . nitroGLYCERIN (NITROSTAT) 0.4 MG SL tablet Place 1 tablet (0.4 mg total) under the tongue every 5 (five) minutes as needed for chest pain. 25 tablet 3  . Omega-3 1000 MG CAPS Take 2 capsules (2,000 mg total) by mouth 2 (two) times daily. 180 capsule 1  . oxyCODONE-acetaminophen (PERCOCET/ROXICET)  5-325 MG tablet Take 1 tablet by mouth every 6 (six) hours as needed for severe pain.    . potassium chloride SA (K-DUR,KLOR-CON) 20 MEQ tablet Take 60 mEq by mouth every morning. And 20 meq by mouth in the evening    . tamsulosin (FLOMAX) 0.4 MG CAPS capsule Take 0.4 mg by mouth daily.     Marland Kitchen torsemide (DEMADEX) 20 MG tablet Take 20 mg by mouth daily after supper. TAKE 3 TABLETS (60MG ) IN THE AFTERNOON AND TAKE 1 TABLET (20MG ) AT NIGHT.    Marland Kitchen traZODone (DESYREL) 100 MG tablet Take 3 tablets (300 mg total) by mouth at bedtime. 90 tablet 2  . apixaban (ELIQUIS) 5 MG TABS tablet Take 1 tablet (5 mg total) by mouth 2 (two) times daily. 60 tablet 11   No current facility-administered medications for this visit.     Past Medical History:  Diagnosis Date  . Arthritis     "right hand; right knee" (06/19/2014)  . CAD (coronary artery disease)    2v CAD with subtotal occlusion of small co-dominant RCA and borderline lesion in moderate-sized OM-1  . CHF (congestive heart failure) (HCC)    Preserved EF  . Coronary artery disease involving native coronary artery of native heart with angina pectoris ([place]) 09/11/2016  . Degenerative joint disease of knee, right   . Dysrhythmia   . Enlarged prostate   . Hyperlipidemia   . Hypertension   . Pneumonia 05/2014  . Prediabetes 10/03/2014  . Scrotal edema 04/03/2015  . SMALL BOWEL OBSTRUCTION, HX OF 08/21/2007   Annotation: with narrowing in the ileocecal region Qualifier: Diagnosis of  By: Hassell Done FNP, Tori Milks      Past Surgical History:  Procedure Laterality Date  . CARDIAC CATHETERIZATION N/A 02/16/2016   Procedure: Right/Left Heart Cath and Coronary Angiography;  Surgeon: Jolaine Artist, MD;  Location: [place] [facility];  Service: Cardiovascular;  Laterality: N/A;  . COLONOSCOPY N/A 04/01/2016   Procedure: COLONOSCOPY;  Surgeon: Leighton Ruff, MD;  [facility];  Service: Endoscopy;  Laterality: N/A;  . INGUINAL HERNIA REPAIR Left 1990's  . IR GENERIC HISTORICAL  07/05/2016   IR RADIOLOGIST EVAL & MGMT 07/05/2016 Corrie Mckusick, DO [facility] INTERV RAD  . XI ROBOT ABDOMINAL PERINEAL RESECTION N/A 09/28/2016   Procedure: XI ROBOT ABDOMINOPERINEAL RESECTION WITH PERMANENT COLOSTOMY ERAS PATHWAY;  Surgeon: Michael Boston, MD;  Location: [facility];  Service: General;  Laterality: N/A;    ROS:     As stated in the HPI and negative for all other systems.  PHYSICAL EXAM BP 134/66   Pulse 82   Ht 5\' 9"  (1.753 m)   Wt 196 lb (88.9 kg)   BMI 28.94 kg/m   GENERAL:  Well appearing NECK:  No jugular venous distention, waveform within normal limits, carotid upstroke brisk and symmetric, no bruits, no thyromegaly LUNGS:  Clear to auscultation bilaterally BACK:  No CVA tenderness CHEST:  Unremarkable HEART:  PMI not displaced  or sustained,S1 and S2 within normal limits, no S3, no clicks, no rubs, no murmurs, irregular ABD:  Flat, positive bowel sounds normal in frequency in pitch, no bruits, no rebound, no guarding, no midline pulsatile mass, no hepatomegaly, no splenomegaly, colostomy bag EXT:  2 plus pulses throughout, mild edema, no cyanosis no clubbing    Lab Results  Component Value Date   CREATININE 1.35 (H) 01/26/2017     ASSESSMENT AND PLAN  ATRIAL FIB:      Patrick [name] has a  CHA2DS2 - VASc score of 5.    We had a long discussion about the risk benefits.  He agrees to start Eliquis.  He has no active bleeding.  I did check with Dr. Burr Medico and Dr. Johney Maine and they did not know of a contraindication from a Onc or surgical standpoint.  I will check a CBC in two weeks.    CAD:  The patient has no new sypmtoms.  No further cardiovascular testing is indicated.  We will continue with aggressive risk reduction and meds as listed.  Mild renal insufficiency:  I will check a BMET today.  CM:  This is probably non ischemic.  No change in therapy is indicated.   HTN:  The blood pressure is at target. No change in medications is indicated. We will continue with therapeutic lifestyle changes (TLC).  PULM HTN:   I will follow up with echoes in the past.

## 2017-01-25 NOTE — Telephone Encounter (Signed)
Pt received a call from [place] to scheduled an appt.  According to his insurance company, [employer] [place] is not in his network.  What should he do?

## 2017-01-26 ENCOUNTER — Encounter: Payer: Self-pay | Admitting: Cardiology

## 2017-01-26 ENCOUNTER — Ambulatory Visit (INDEPENDENT_AMBULATORY_CARE_PROVIDER_SITE_OTHER): Payer: Medicare Other | Admitting: Cardiology

## 2017-01-26 VITALS — BP 134/66 | HR 82 | Ht 69.0 in | Wt 196.0 lb

## 2017-01-26 DIAGNOSIS — I429 Cardiomyopathy, unspecified: Secondary | ICD-10-CM

## 2017-01-26 DIAGNOSIS — I482 Chronic atrial fibrillation: Secondary | ICD-10-CM

## 2017-01-26 DIAGNOSIS — Z79899 Other long term (current) drug therapy: Secondary | ICD-10-CM | POA: Diagnosis not present

## 2017-01-26 DIAGNOSIS — I2510 Atherosclerotic heart disease of native coronary artery without angina pectoris: Secondary | ICD-10-CM | POA: Diagnosis not present

## 2017-01-26 DIAGNOSIS — D631 Anemia in chronic kidney disease: Secondary | ICD-10-CM | POA: Diagnosis not present

## 2017-01-26 DIAGNOSIS — I255 Ischemic cardiomyopathy: Secondary | ICD-10-CM | POA: Diagnosis not present

## 2017-01-26 DIAGNOSIS — I10 Essential (primary) hypertension: Secondary | ICD-10-CM | POA: Diagnosis not present

## 2017-01-26 DIAGNOSIS — I4510 Unspecified right bundle-branch block: Secondary | ICD-10-CM | POA: Diagnosis not present

## 2017-01-26 DIAGNOSIS — N183 Chronic kidney disease, stage 3 (moderate): Secondary | ICD-10-CM | POA: Diagnosis not present

## 2017-01-26 DIAGNOSIS — Z433 Encounter for attention to colostomy: Secondary | ICD-10-CM | POA: Diagnosis not present

## 2017-01-26 DIAGNOSIS — I272 Pulmonary hypertension, unspecified: Secondary | ICD-10-CM | POA: Diagnosis not present

## 2017-01-26 DIAGNOSIS — I4821 Permanent atrial fibrillation: Secondary | ICD-10-CM

## 2017-01-26 DIAGNOSIS — I4891 Unspecified atrial fibrillation: Secondary | ICD-10-CM | POA: Diagnosis not present

## 2017-01-26 DIAGNOSIS — I5042 Chronic combined systolic (congestive) and diastolic (congestive) heart failure: Secondary | ICD-10-CM | POA: Diagnosis not present

## 2017-01-26 DIAGNOSIS — L988 Other specified disorders of the skin and subcutaneous tissue: Secondary | ICD-10-CM | POA: Diagnosis not present

## 2017-01-26 DIAGNOSIS — C20 Malignant neoplasm of rectum: Secondary | ICD-10-CM | POA: Diagnosis not present

## 2017-01-26 DIAGNOSIS — I130 Hypertensive heart and chronic kidney disease with heart failure and stage 1 through stage 4 chronic kidney disease, or unspecified chronic kidney disease: Secondary | ICD-10-CM | POA: Diagnosis not present

## 2017-01-26 MED ORDER — APIXABAN 5 MG PO TABS: 5.0000 mg | ORAL_TABLET | Freq: Two times a day (BID) | ORAL | 11 refills | Status: DC

## 2017-01-26 NOTE — Patient Instructions (Addendum)
Medication Instructions:  START- Eliquis 5 mg daily  If you need a refill on your cardiac medications before your next appointment, please call your pharmacy.  Labwork: BMP Today  CBC 2 weeks  Testing/Procedures: None Ordered  Follow-Up: Your physician wants you to follow-up in: 1 Months.    Thank you for choosing [facility] at [facility]!!

## 2017-01-27 ENCOUNTER — Encounter: Payer: Self-pay | Admitting: Cardiology

## 2017-01-27 LAB — BASIC METABOLIC PANEL
BUN/Creatinine Ratio: 18 (ref 10–24)
BUN: 24 mg/dL (ref 8–27)
CO2: 29 mmol/L (ref 20–29)
CREATININE: 1.35 mg/dL — AB (ref 0.76–1.27)
Calcium: 9.5 mg/dL (ref 8.6–10.2)
Chloride: 100 mmol/L (ref 96–106)
GFR calc Af Amer: 62 mL/min/{1.73_m2} (ref >59)
GFR calc non Af Amer: 54 mL/min/{1.73_m2} — ABNORMAL LOW (ref >59)
GLUCOSE: 87 mg/dL (ref 65–99)
Potassium: 4.9 mmol/L (ref 3.5–5.2)
Sodium: 143 mmol/L (ref 134–144)

## 2017-01-31 DIAGNOSIS — R338 Other retention of urine: Secondary | ICD-10-CM | POA: Diagnosis not present

## 2017-02-01 DIAGNOSIS — C20 Malignant neoplasm of rectum: Secondary | ICD-10-CM | POA: Diagnosis not present

## 2017-02-01 DIAGNOSIS — I255 Ischemic cardiomyopathy: Secondary | ICD-10-CM | POA: Diagnosis not present

## 2017-02-01 DIAGNOSIS — D631 Anemia in chronic kidney disease: Secondary | ICD-10-CM | POA: Diagnosis not present

## 2017-02-01 DIAGNOSIS — I4510 Unspecified right bundle-branch block: Secondary | ICD-10-CM | POA: Diagnosis not present

## 2017-02-01 DIAGNOSIS — I4891 Unspecified atrial fibrillation: Secondary | ICD-10-CM | POA: Diagnosis not present

## 2017-02-01 DIAGNOSIS — I272 Pulmonary hypertension, unspecified: Secondary | ICD-10-CM | POA: Diagnosis not present

## 2017-02-01 DIAGNOSIS — I2510 Atherosclerotic heart disease of native coronary artery without angina pectoris: Secondary | ICD-10-CM | POA: Diagnosis not present

## 2017-02-01 DIAGNOSIS — N183 Chronic kidney disease, stage 3 (moderate): Secondary | ICD-10-CM | POA: Diagnosis not present

## 2017-02-01 DIAGNOSIS — Z433 Encounter for attention to colostomy: Secondary | ICD-10-CM | POA: Diagnosis not present

## 2017-02-01 DIAGNOSIS — L988 Other specified disorders of the skin and subcutaneous tissue: Secondary | ICD-10-CM | POA: Diagnosis not present

## 2017-02-01 DIAGNOSIS — I5042 Chronic combined systolic (congestive) and diastolic (congestive) heart failure: Secondary | ICD-10-CM | POA: Diagnosis not present

## 2017-02-01 DIAGNOSIS — I130 Hypertensive heart and chronic kidney disease with heart failure and stage 1 through stage 4 chronic kidney disease, or unspecified chronic kidney disease: Secondary | ICD-10-CM | POA: Diagnosis not present

## 2017-02-02 DIAGNOSIS — I4891 Unspecified atrial fibrillation: Secondary | ICD-10-CM | POA: Diagnosis not present

## 2017-02-02 DIAGNOSIS — I130 Hypertensive heart and chronic kidney disease with heart failure and stage 1 through stage 4 chronic kidney disease, or unspecified chronic kidney disease: Secondary | ICD-10-CM | POA: Diagnosis not present

## 2017-02-02 DIAGNOSIS — C20 Malignant neoplasm of rectum: Secondary | ICD-10-CM | POA: Diagnosis not present

## 2017-02-02 DIAGNOSIS — I2510 Atherosclerotic heart disease of native coronary artery without angina pectoris: Secondary | ICD-10-CM | POA: Diagnosis not present

## 2017-02-02 DIAGNOSIS — I4510 Unspecified right bundle-branch block: Secondary | ICD-10-CM | POA: Diagnosis not present

## 2017-02-02 DIAGNOSIS — I255 Ischemic cardiomyopathy: Secondary | ICD-10-CM | POA: Diagnosis not present

## 2017-02-02 DIAGNOSIS — N183 Chronic kidney disease, stage 3 (moderate): Secondary | ICD-10-CM | POA: Diagnosis not present

## 2017-02-02 DIAGNOSIS — L988 Other specified disorders of the skin and subcutaneous tissue: Secondary | ICD-10-CM | POA: Diagnosis not present

## 2017-02-02 DIAGNOSIS — I272 Pulmonary hypertension, unspecified: Secondary | ICD-10-CM | POA: Diagnosis not present

## 2017-02-02 DIAGNOSIS — Z433 Encounter for attention to colostomy: Secondary | ICD-10-CM | POA: Diagnosis not present

## 2017-02-02 DIAGNOSIS — D631 Anemia in chronic kidney disease: Secondary | ICD-10-CM | POA: Diagnosis not present

## 2017-02-02 DIAGNOSIS — I5042 Chronic combined systolic (congestive) and diastolic (congestive) heart failure: Secondary | ICD-10-CM | POA: Diagnosis not present

## 2017-02-06 ENCOUNTER — Ambulatory Visit: Payer: Self-pay

## 2017-02-06 DIAGNOSIS — C20 Malignant neoplasm of rectum: Secondary | ICD-10-CM | POA: Diagnosis not present

## 2017-02-06 DIAGNOSIS — I4891 Unspecified atrial fibrillation: Secondary | ICD-10-CM | POA: Diagnosis not present

## 2017-02-06 DIAGNOSIS — Z433 Encounter for attention to colostomy: Secondary | ICD-10-CM | POA: Diagnosis not present

## 2017-02-06 DIAGNOSIS — I255 Ischemic cardiomyopathy: Secondary | ICD-10-CM | POA: Diagnosis not present

## 2017-02-06 DIAGNOSIS — D631 Anemia in chronic kidney disease: Secondary | ICD-10-CM | POA: Diagnosis not present

## 2017-02-06 DIAGNOSIS — I5042 Chronic combined systolic (congestive) and diastolic (congestive) heart failure: Secondary | ICD-10-CM | POA: Diagnosis not present

## 2017-02-06 DIAGNOSIS — L988 Other specified disorders of the skin and subcutaneous tissue: Secondary | ICD-10-CM | POA: Diagnosis not present

## 2017-02-06 DIAGNOSIS — N183 Chronic kidney disease, stage 3 (moderate): Secondary | ICD-10-CM | POA: Diagnosis not present

## 2017-02-06 DIAGNOSIS — I2510 Atherosclerotic heart disease of native coronary artery without angina pectoris: Secondary | ICD-10-CM | POA: Diagnosis not present

## 2017-02-06 DIAGNOSIS — I4510 Unspecified right bundle-branch block: Secondary | ICD-10-CM | POA: Diagnosis not present

## 2017-02-06 DIAGNOSIS — I272 Pulmonary hypertension, unspecified: Secondary | ICD-10-CM | POA: Diagnosis not present

## 2017-02-06 DIAGNOSIS — I130 Hypertensive heart and chronic kidney disease with heart failure and stage 1 through stage 4 chronic kidney disease, or unspecified chronic kidney disease: Secondary | ICD-10-CM | POA: Diagnosis not present

## 2017-02-09 DIAGNOSIS — Z433 Encounter for attention to colostomy: Secondary | ICD-10-CM | POA: Diagnosis not present

## 2017-02-09 DIAGNOSIS — I4891 Unspecified atrial fibrillation: Secondary | ICD-10-CM | POA: Diagnosis not present

## 2017-02-09 DIAGNOSIS — I130 Hypertensive heart and chronic kidney disease with heart failure and stage 1 through stage 4 chronic kidney disease, or unspecified chronic kidney disease: Secondary | ICD-10-CM | POA: Diagnosis not present

## 2017-02-09 DIAGNOSIS — I272 Pulmonary hypertension, unspecified: Secondary | ICD-10-CM | POA: Diagnosis not present

## 2017-02-09 DIAGNOSIS — I2510 Atherosclerotic heart disease of native coronary artery without angina pectoris: Secondary | ICD-10-CM | POA: Diagnosis not present

## 2017-02-09 DIAGNOSIS — D631 Anemia in chronic kidney disease: Secondary | ICD-10-CM | POA: Diagnosis not present

## 2017-02-09 DIAGNOSIS — C20 Malignant neoplasm of rectum: Secondary | ICD-10-CM | POA: Diagnosis not present

## 2017-02-09 DIAGNOSIS — N183 Chronic kidney disease, stage 3 (moderate): Secondary | ICD-10-CM | POA: Diagnosis not present

## 2017-02-09 DIAGNOSIS — L988 Other specified disorders of the skin and subcutaneous tissue: Secondary | ICD-10-CM | POA: Diagnosis not present

## 2017-02-09 DIAGNOSIS — I4510 Unspecified right bundle-branch block: Secondary | ICD-10-CM | POA: Diagnosis not present

## 2017-02-09 DIAGNOSIS — I255 Ischemic cardiomyopathy: Secondary | ICD-10-CM | POA: Diagnosis not present

## 2017-02-09 DIAGNOSIS — I5042 Chronic combined systolic (congestive) and diastolic (congestive) heart failure: Secondary | ICD-10-CM | POA: Diagnosis not present

## 2017-02-10 ENCOUNTER — Other Ambulatory Visit: Payer: Self-pay

## 2017-02-10 ENCOUNTER — Other Ambulatory Visit: Payer: Self-pay | Admitting: Urology

## 2017-02-10 ENCOUNTER — Other Ambulatory Visit: Payer: Self-pay | Admitting: Internal Medicine

## 2017-02-10 DIAGNOSIS — C642 Malignant neoplasm of left kidney, except renal pelvis: Secondary | ICD-10-CM
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
## 2017-02-13 ENCOUNTER — Other Ambulatory Visit: Payer: Self-pay

## 2017-02-13 ENCOUNTER — Ambulatory Visit (HOSPITAL_COMMUNITY): Payer: Medicare Other

## 2017-02-13 DIAGNOSIS — I4510 Unspecified right bundle-branch block: Secondary | ICD-10-CM | POA: Diagnosis not present

## 2017-02-13 DIAGNOSIS — I2510 Atherosclerotic heart disease of native coronary artery without angina pectoris: Secondary | ICD-10-CM | POA: Diagnosis not present

## 2017-02-13 DIAGNOSIS — C20 Malignant neoplasm of rectum: Secondary | ICD-10-CM | POA: Diagnosis not present

## 2017-02-13 DIAGNOSIS — L988 Other specified disorders of the skin and subcutaneous tissue: Secondary | ICD-10-CM | POA: Diagnosis not present

## 2017-02-13 DIAGNOSIS — I4891 Unspecified atrial fibrillation: Secondary | ICD-10-CM | POA: Diagnosis not present

## 2017-02-13 DIAGNOSIS — I255 Ischemic cardiomyopathy: Secondary | ICD-10-CM | POA: Diagnosis not present

## 2017-02-13 DIAGNOSIS — I272 Pulmonary hypertension, unspecified: Secondary | ICD-10-CM | POA: Diagnosis not present

## 2017-02-13 DIAGNOSIS — I130 Hypertensive heart and chronic kidney disease with heart failure and stage 1 through stage 4 chronic kidney disease, or unspecified chronic kidney disease: Secondary | ICD-10-CM | POA: Diagnosis not present

## 2017-02-13 DIAGNOSIS — Z433 Encounter for attention to colostomy: Secondary | ICD-10-CM | POA: Diagnosis not present

## 2017-02-13 DIAGNOSIS — D631 Anemia in chronic kidney disease: Secondary | ICD-10-CM | POA: Diagnosis not present

## 2017-02-13 DIAGNOSIS — I5042 Chronic combined systolic (congestive) and diastolic (congestive) heart failure: Secondary | ICD-10-CM | POA: Diagnosis not present

## 2017-02-13 DIAGNOSIS — N183 Chronic kidney disease, stage 3 (moderate): Secondary | ICD-10-CM | POA: Diagnosis not present

## 2017-02-14 ENCOUNTER — Ambulatory Visit: Payer: Self-pay

## 2017-02-16 ENCOUNTER — Other Ambulatory Visit: Payer: Self-pay | Admitting: Hematology

## 2017-02-16 ENCOUNTER — Ambulatory Visit: Payer: Self-pay | Admitting: Hematology

## 2017-02-16 DIAGNOSIS — C20 Malignant neoplasm of rectum: Secondary | ICD-10-CM | POA: Diagnosis not present

## 2017-02-16 DIAGNOSIS — L988 Other specified disorders of the skin and subcutaneous tissue: Secondary | ICD-10-CM | POA: Diagnosis not present

## 2017-02-16 DIAGNOSIS — I4510 Unspecified right bundle-branch block: Secondary | ICD-10-CM | POA: Diagnosis not present

## 2017-02-16 DIAGNOSIS — I4891 Unspecified atrial fibrillation: Secondary | ICD-10-CM | POA: Diagnosis not present

## 2017-02-16 DIAGNOSIS — I2510 Atherosclerotic heart disease of native coronary artery without angina pectoris: Secondary | ICD-10-CM | POA: Diagnosis not present

## 2017-02-16 DIAGNOSIS — I272 Pulmonary hypertension, unspecified: Secondary | ICD-10-CM | POA: Diagnosis not present

## 2017-02-16 DIAGNOSIS — I5042 Chronic combined systolic (congestive) and diastolic (congestive) heart failure: Secondary | ICD-10-CM | POA: Diagnosis not present

## 2017-02-16 DIAGNOSIS — D631 Anemia in chronic kidney disease: Secondary | ICD-10-CM | POA: Diagnosis not present

## 2017-02-16 DIAGNOSIS — N183 Chronic kidney disease, stage 3 (moderate): Secondary | ICD-10-CM | POA: Diagnosis not present

## 2017-02-16 DIAGNOSIS — I255 Ischemic cardiomyopathy: Secondary | ICD-10-CM | POA: Diagnosis not present

## 2017-02-16 DIAGNOSIS — I130 Hypertensive heart and chronic kidney disease with heart failure and stage 1 through stage 4 chronic kidney disease, or unspecified chronic kidney disease: Secondary | ICD-10-CM | POA: Diagnosis not present

## 2017-02-16 DIAGNOSIS — Z433 Encounter for attention to colostomy: Secondary | ICD-10-CM | POA: Diagnosis not present

## 2017-02-20 ENCOUNTER — Ambulatory Visit: Payer: Self-pay | Admitting: Internal Medicine

## 2017-02-20 ENCOUNTER — Other Ambulatory Visit: Payer: Self-pay | Admitting: Cardiology

## 2017-02-20 ENCOUNTER — Telehealth: Payer: Self-pay

## 2017-02-20 DIAGNOSIS — I255 Ischemic cardiomyopathy: Secondary | ICD-10-CM | POA: Diagnosis not present

## 2017-02-20 DIAGNOSIS — N183 Chronic kidney disease, stage 3 (moderate): Secondary | ICD-10-CM | POA: Diagnosis not present

## 2017-02-20 DIAGNOSIS — L988 Other specified disorders of the skin and subcutaneous tissue: Secondary | ICD-10-CM | POA: Diagnosis not present

## 2017-02-20 DIAGNOSIS — I4510 Unspecified right bundle-branch block: Secondary | ICD-10-CM | POA: Diagnosis not present

## 2017-02-20 DIAGNOSIS — I272 Pulmonary hypertension, unspecified: Secondary | ICD-10-CM | POA: Diagnosis not present

## 2017-02-20 DIAGNOSIS — D631 Anemia in chronic kidney disease: Secondary | ICD-10-CM | POA: Diagnosis not present

## 2017-02-20 DIAGNOSIS — I2510 Atherosclerotic heart disease of native coronary artery without angina pectoris: Secondary | ICD-10-CM | POA: Diagnosis not present

## 2017-02-20 DIAGNOSIS — C20 Malignant neoplasm of rectum: Secondary | ICD-10-CM | POA: Diagnosis not present

## 2017-02-20 DIAGNOSIS — Z79899 Other long term (current) drug therapy: Secondary | ICD-10-CM | POA: Diagnosis not present

## 2017-02-20 DIAGNOSIS — I4891 Unspecified atrial fibrillation: Secondary | ICD-10-CM | POA: Diagnosis not present

## 2017-02-20 DIAGNOSIS — I5042 Chronic combined systolic (congestive) and diastolic (congestive) heart failure: Secondary | ICD-10-CM | POA: Diagnosis not present

## 2017-02-20 DIAGNOSIS — I130 Hypertensive heart and chronic kidney disease with heart failure and stage 1 through stage 4 chronic kidney disease, or unspecified chronic kidney disease: Secondary | ICD-10-CM | POA: Diagnosis not present

## 2017-02-20 DIAGNOSIS — Z433 Encounter for attention to colostomy: Secondary | ICD-10-CM | POA: Diagnosis not present

## 2017-02-20 NOTE — Telephone Encounter (Signed)
Spoke with patient concerning appointment next week/ per 9/21 message

## 2017-02-21 ENCOUNTER — Telehealth: Payer: Self-pay | Admitting: Cardiology

## 2017-02-21 LAB — CBC
Hematocrit: 34.6 % — ABNORMAL LOW (ref 37.5–51.0)
Hemoglobin: 10.9 g/dL — ABNORMAL LOW (ref 13.0–17.7)
MCH: 26.6 pg (ref 26.6–33.0)
MCHC: 31.5 g/dL (ref 31.5–35.7)
MCV: 84 fL (ref 79–97)
PLATELETS: 102 10*3/uL — AB (ref 150–379)
RBC: 4.1 x10E6/uL — AB (ref 4.14–5.80)
RDW: 18.1 % — ABNORMAL HIGH (ref 12.3–15.4)
WBC: 4.3 10*3/uL (ref 3.4–10.8)

## 2017-02-21 NOTE — Telephone Encounter (Signed)
Patient called with increased BP.  I am going to stop the HCTZ and change to chlorthalidone 25 mg daily.  Dispense number 90 with 3 refills.  Take one PO daily.

## 2017-02-22 ENCOUNTER — Ambulatory Visit (INDEPENDENT_AMBULATORY_CARE_PROVIDER_SITE_OTHER): Payer: Medicare Other | Admitting: Physician Assistant

## 2017-02-22 ENCOUNTER — Encounter: Payer: Self-pay | Admitting: Physician Assistant

## 2017-02-22 VITALS — BP 115/70 | HR 61 | Ht 69.0 in | Wt 196.6 lb

## 2017-02-22 DIAGNOSIS — I5022 Chronic systolic (congestive) heart failure: Secondary | ICD-10-CM | POA: Diagnosis not present

## 2017-02-22 DIAGNOSIS — E785 Hyperlipidemia, unspecified: Secondary | ICD-10-CM | POA: Diagnosis not present

## 2017-02-22 DIAGNOSIS — Z79899 Other long term (current) drug therapy: Secondary | ICD-10-CM

## 2017-02-22 DIAGNOSIS — I482 Chronic atrial fibrillation, unspecified: Secondary | ICD-10-CM

## 2017-02-22 DIAGNOSIS — R7303 Prediabetes: Secondary | ICD-10-CM | POA: Diagnosis not present

## 2017-02-22 DIAGNOSIS — I10 Essential (primary) hypertension: Secondary | ICD-10-CM

## 2017-02-22 DIAGNOSIS — C20 Malignant neoplasm of rectum: Secondary | ICD-10-CM | POA: Diagnosis not present

## 2017-02-22 DIAGNOSIS — I2510 Atherosclerotic heart disease of native coronary artery without angina pectoris: Secondary | ICD-10-CM | POA: Diagnosis not present

## 2017-02-22 NOTE — Progress Notes (Signed)
Cardiology Office Note    Date:  02/22/2017   ID:  Patrick [name], DOB 03-Sep-1950, MRN [id number]  PCP:  Sela Hua, MD  Cardiologist:  Dr. Percival Spanish  Chief Complaint  Patient presents with  . Follow-up    seen for Dr. Percival Spanish.    History of Present Illness:  Patrick [name] is a 66 y.o. male with PMH of CAD, HTN, HLD, permanent atrial fibrillation, prediabetes and chronic systolic HF. He had a cardiac catheterization in September 2017 with totally occluded small codominant RCA. He also had borderline 70% lesion in the moderate-sized first marginal. He had a severe cardiomyopathy with EF of 20%. He did have mild to moderate pulmonary venous hypertension and a normal cardiac output. Medical therapy was recommended. Echocardiogram in December demonstrated EF to have improved to 30-35%. He was also found to be in atrial fibrillation. In May 2018, he had resection of colon cancer with permanent colostomy. He was in the hospital again a few weeks later in May 2018 with AKI. Metolazone, losartan and torsemide were held. He also had mild troponin elevation. Torsemide was restarted upon discharge. He was recently started on eliquis 5mg  BID.   He presents today for cardiology office visit. He denies any bleeding issues. His left lower quadrant colostomy site is clean, no obvious blood in the stool. There is no significant redness around the edges. Recent lab work shows stable hemoglobin level. Otherwise he has not had any significant chest discomfort or shortness of breath. Although the initial left and right heart cath in September 2017 suggested Entresto, this does not appears to have been attempted. He symptom be doing quite well on the current medication, will continue carvedilol and losartan. Although there is a phone note yesterday suggesting patient to stop the HCTZ and switch to chlorthalidone due to uncontrolled blood pressure, he was never on HCTZ anyway and his blood pressure symptoms to have been  pretty well-controlled. I think the phone note was put in by mistake. I did not adjust his torsemide dose, I did however obtain a basic metabolic panel to make sure his renal function has not changed. Otherwise he seems to be euvolemic on physical exam, there is no lower extremity edema, orthopnea or PND episodes.   Past Medical History:  Diagnosis Date  . Arthritis    "right hand; right knee" (06/19/2014)  . CAD (coronary artery disease)    2v CAD with subtotal occlusion of small co-dominant RCA and borderline lesion in moderate-sized OM-1  . CHF (congestive heart failure) (HCC)    Preserved EF  . Coronary artery disease involving native coronary artery of native heart with angina pectoris ([place]) 09/11/2016  . Degenerative joint disease of knee, right   . Dysrhythmia   . Enlarged prostate   . Hyperlipidemia   . Hypertension   . Pneumonia 05/2014  . Prediabetes 10/03/2014  . Scrotal edema 04/03/2015  . SMALL BOWEL OBSTRUCTION, HX OF 08/21/2007   Annotation: with narrowing in the ileocecal region Qualifier: Diagnosis of  By: Hassell Done FNP, Tori Milks      Past Surgical History:  Procedure Laterality Date  . CARDIAC CATHETERIZATION N/A 02/16/2016   Procedure: Right/Left Heart Cath and Coronary Angiography;  Surgeon: Jolaine Artist, MD;  Location: [place] [facility];  Service: Cardiovascular;  Laterality: N/A;  . COLONOSCOPY N/A 04/01/2016   Procedure: COLONOSCOPY;  Surgeon: Leighton Ruff, MD;  [facility];  Service: Endoscopy;  Laterality: N/A;  . INGUINAL HERNIA REPAIR Left 1990's  .  IR GENERIC HISTORICAL  07/05/2016   IR RADIOLOGIST EVAL & MGMT 07/05/2016 Corrie Mckusick, DO [facility] INTERV RAD  . XI ROBOT ABDOMINAL PERINEAL RESECTION N/A 09/28/2016   Procedure: XI ROBOT ABDOMINOPERINEAL RESECTION WITH PERMANENT COLOSTOMY ERAS PATHWAY;  Surgeon: Michael Boston, MD;  [facility]: [facility];  Service: General;  Laterality: N/A;    Current Medications: Outpatient Medications Prior to Visit    Medication Sig Dispense Refill  . acetaminophen (TYLENOL) 500 MG tablet Take 1,000 mg by mouth every 6 (six) hours as needed (for pain/fever/headaches.).     Marland Kitchen allopurinol (ZYLOPRIM) 300 MG tablet Take 1 tablet (300 mg total) by mouth daily. 90 tablet 0  . apixaban (ELIQUIS) 5 MG TABS tablet Take 1 tablet (5 mg total) by mouth 2 (two) times daily. 60 tablet 11  . Ascorbic Acid (VITAMIN C) 1000 MG tablet Take 2,000 mg by mouth 2 (two) times daily.    Marland Kitchen buPROPion (WELLBUTRIN) 100 MG tablet Take 1 tablet (100 mg total) by mouth 2 (two) times daily. 120 tablet 0  . carvedilol (COREG) 25 MG tablet take 1 tablets by mouth twice a day 180 tablet 3  . colchicine 0.6 MG tablet Take 0.6 mg by mouth 2 (two) times daily as needed.    . ferrous sulfate 325 (65 FE) MG tablet Take 1 tablet (325 mg total) by mouth 2 (two) times daily with a meal. 180 tablet 0  . finasteride (PROSCAR) 5 MG tablet take 1 tablet by mouth once daily 90 tablet 1  . losartan (COZAAR) 50 MG tablet Take 1 tablet (50 mg total) by mouth daily. 90 tablet 3  . lovastatin (MEVACOR) 20 MG tablet Take 1 tablet (20 mg total) by mouth at bedtime. 90 tablet 1  . methocarbamol (ROBAXIN) 750 MG tablet Take 1 tablet (750 mg total) by mouth 4 (four) times daily as needed (use for muscle cramps/pain). 30 tablet 2  . Multiple Vitamins-Minerals (MULTIVITAMIN WITH MINERALS) tablet Take 1 tablet by mouth daily. Men's 50+ Multivitamin    . nitroGLYCERIN (NITROSTAT) 0.4 MG SL tablet Place 1 tablet (0.4 mg total) under the tongue every 5 (five) minutes as needed for chest pain. 25 tablet 3  . Omega-3 1000 MG CAPS Take 2 capsules (2,000 mg total) by mouth 2 (two) times daily. 180 capsule 1  . oxyCODONE-acetaminophen (PERCOCET/ROXICET) 5-325 MG tablet Take 1 tablet by mouth every 6 (six) hours as needed for severe pain.    . potassium chloride SA (K-DUR,KLOR-CON) 20 MEQ tablet Take 60 mEq by mouth every morning. And 20 meq by mouth in the evening    .  tamsulosin (FLOMAX) 0.4 MG CAPS capsule Take 0.4 mg by mouth daily.     Marland Kitchen torsemide (DEMADEX) 20 MG tablet Take 20 mg by mouth daily after supper. TAKE 3 TABLETS (60MG ) IN THE AFTERNOON AND TAKE 1 TABLET (20MG ) AT NIGHT.    Marland Kitchen traZODone (DESYREL) 100 MG tablet Take 3 tablets (300 mg total) by mouth at bedtime. 90 tablet 2  . mirtazapine (REMERON) 30 MG tablet Take 1 tablet (30 mg total) by mouth at bedtime. (Patient not taking: Reported on 02/22/2017) 30 tablet 1   No facility-administered medications prior to visit.      Allergies:   Nsaids   Social History   Social History  . Marital status: Single    Spouse name: N/A  . Number of children: N/A  . Years of education: N/A   Social History Main Topics  . Smoking status: Former Smoker  Years: 5.00    Types: Pipe, Cigars  . Smokeless tobacco: Never Used     Comment: 06/19/2014 "stopped smoking in ~ 2014; used to smoke a pipe or cigar a couple times/month"  . Alcohol use No  . Drug use: No  . Sexual activity: No   Other Topics Concern  . None   Social History Narrative   Single, lives w/his sister of whom he is caregiver        Family History:  The patient's family history includes Arthritis in his sister; COPD in his sister; Cancer (age of onset: 63) in his father; Dementia in his father; Diabetes in his mother; Edema in his sister; Hypertension in his mother; Stroke in his mother.   ROS:   Please see the history of present illness.    ROS All other systems reviewed and are negative.   PHYSICAL EXAM:   VS:  BP 115/70   Pulse 61   Ht 5\' 9"  (1.753 m)   Wt 196 lb 9.6 oz (89.2 kg)   SpO2 97%   BMI 29.03 kg/m    GEN: Well nourished, well developed, in no acute distress  HEENT: normal  Neck: no JVD, carotid bruits, or masses Cardiac: Irregularly irregular; no murmurs, rubs, or gallops,no edema  Respiratory:  clear to auscultation bilaterally, normal work of breathing GI: Colostomy bag noted. No sign of bleeding. MS: no  deformity or atrophy  Skin: warm and dry, no rash Neuro:  Alert and Oriented x 3, Strength and sensation are intact Psych: euthymic mood, full affect  Wt Readings from Last 3 Encounters:  02/22/17 196 lb 9.6 oz (89.2 kg)  01/26/17 196 lb (88.9 kg)  01/09/17 203 lb (92.1 kg)      Studies/Labs Reviewed:   EKG:  EKG is not ordered today.   Recent Labs: 09/30/2016: Magnesium 2.5 12/22/2016: ALT 9 01/26/2017: BUN 24; Creatinine, Ser 1.35; Potassium 4.9; Sodium 143 02/20/2017: Hemoglobin 10.9; Platelets 102   Lipid Panel    Component Value Date/Time   CHOL 78 08/12/2014 1220   TRIG 70 08/12/2014 1220   HDL 32 (L) 08/12/2014 1220   CHOLHDL 2.4 08/12/2014 1220   VLDL 14 08/12/2014 1220   LDLCALC 32 08/12/2014 1220   LDLDIRECT 23 12/03/2013 1206    Additional studies/ records that were reviewed today include:   Cath 02/16/2016 Conclusion     Mid RCA lesion, 99 %stenosed.  Ost 1st Mrg to 1st Mrg lesion, 70 %stenosed.   Findings:  RA = 10 RV = 69/13 PA = 62/23 (36) PCW = 23 Fick cardiac output/index = 4.9/2.4 PVR = 2.6 WU SVR = 1363 FA sat = 97% PA sat = 64%, 64%  Assessment: 1. 2v CAD with subtotal occlusion of small co-dominant RCA and borderline lesion in moderate-sized OM-1 2. 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 Normal cardiac output  Plan/Discussion:  Continue aggressive medical therapy. Consider switching losartan to Entresto and sleep study.  Will give one dose IV lasix prior to d/c today.     Echo 05/20/2016 LV EF: 30% -   35%  ------------------------------------------------------------------- Indications:      (I25.5).  ------------------------------------------------------------------- History:   PMH:  Ischemic cardiomyopathy. Acquired from the patient and from the patient&'s chart.  Congestive heart failure.  Risk factors:  Former tobacco use.  Hypertension. Obese. Dyslipidemia.   ------------------------------------------------------------------- Study Conclusions  - Left ventricle: The cavity size was moderately dilated. There was  mild concentric hypertrophy. Systolic function was moderately to   severely reduced. The estimated ejection fraction was in the   range of 30% to 35%. Wall motion was normal; there were no   regional wall motion abnormalities. Features are consistent with   a pseudonormal left ventricular filling pattern, with concomitant   abnormal relaxation and increased filling pressure (grade 2   diastolic dysfunction). Doppler parameters are consistent with   elevated ventricular end-diastolic filling pressure. - Aortic valve: Trileaflet; normal thickness leaflets.   Transvalvular velocity was within the normal range. There was no   stenosis. There was no regurgitation. - Aortic root: The aortic root was normal in size. - Ascending aorta: The ascending aorta was normal in size. - Mitral valve: Structurally normal valve. There was mild   regurgitation. - Left atrium: The atrium was severely dilated. - Right ventricle: The cavity size was normal. Wall thickness was   normal. Systolic function was normal. - Right atrium: The atrium was normal in size. - Tricuspid valve: There was mild regurgitation. - Pulmonic valve: There was mild regurgitation. - Pulmonary arteries: Systolic pressure was within the normal   range. - Inferior vena cava: The vessel was normal in size. - Pericardium, extracardiac: There was no pericardial effusion.   ASSESSMENT:    1. Chronic systolic heart failure ([place])   2. Medication management   3. Chronic atrial fibrillation ([place])   4. Coronary artery disease involving native coronary artery of native heart without angina pectoris   5. Hyperlipidemia LDL goal <70   6. Essential hypertension   7. Prediabetes   8. 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 Chronic systolic heart failure: Nonischemic cardiomyopathy, last echocardiogram showed EF 35%. Continue carvedilol and losartan. May consider Entresto. Euvolemic on physical exam. Continue on the current dose of torsemide. Obtain basic metabolic panel.  2.  Chronic atrial fibrillation: On eliquis 5 mg twice a day. No obvious sign of bleeding, CBC shows stable hemoglobin. Very well rate controlled on physical exam.  3. Hypertension: Blood pressure controlled on carvedilol and losartan  4. Hyperlipidemia: Continue lovastatin  5. Prediabetes: Last hemoglobin A1c obtained in April 2018 was 5.7, continue diet control.  6. Rectal cancer status post colostomy in May 2018: No sign of dehiscence or bleeding issue around colostomy site after starting eliquis.    Medication Adjustments/Labs and Tests Ordered: Current medicines are reviewed at length with the patient today.  Concerns regarding medicines are outlined above.  Medication changes, Labs and Tests ordered today are listed in the Patient Instructions below. Patient Instructions  Medication Instructions:  NO CHANGES If you need a refill on your cardiac medications before your next appointment, please call your pharmacy.  Labwork: BMET TODAY HERE IN OUR OFFICE AT [facility]  Follow-Up: Your physician wants you to follow-up in: 3-4 [place]. You should receive a reminder letter in the mail two months in advance. If you do not receive a letter, please call our office IN November 2018 to schedule the follow-up appointment.  Thank you for choosing [facility] at [employer], [place]  02/22/2017 10:24 PM    [facility] [address], [place], [place]  [address] Phone: [phone]; Fax: [phone]

## 2017-02-22 NOTE — Patient Instructions (Signed)
Medication Instructions:  NO CHANGES If you need a refill on your cardiac medications before your next appointment, please call your pharmacy.  Labwork: BMET TODAY HERE IN OUR OFFICE AT [facility]  Follow-Up: Your physician wants you to follow-up in: 3-4 [place]. You should receive a reminder letter in the mail two months in advance. If you do not receive a letter, please call our office IN November 2018 to schedule the follow-up appointment.  Thank you for choosing [facility] at [facility]!!

## 2017-02-23 ENCOUNTER — Other Ambulatory Visit: Payer: Self-pay | Admitting: Physician Assistant

## 2017-02-23 ENCOUNTER — Telehealth: Payer: Self-pay | Admitting: *Deleted

## 2017-02-23 ENCOUNTER — Ambulatory Visit: Payer: Self-pay

## 2017-02-23 DIAGNOSIS — N183 Chronic kidney disease, stage 3 (moderate): Secondary | ICD-10-CM | POA: Diagnosis not present

## 2017-02-23 DIAGNOSIS — I5022 Chronic systolic (congestive) heart failure: Secondary | ICD-10-CM

## 2017-02-23 DIAGNOSIS — Z433 Encounter for attention to colostomy: Secondary | ICD-10-CM | POA: Diagnosis not present

## 2017-02-23 DIAGNOSIS — I5042 Chronic combined systolic (congestive) and diastolic (congestive) heart failure: Secondary | ICD-10-CM | POA: Diagnosis not present

## 2017-02-23 DIAGNOSIS — I255 Ischemic cardiomyopathy: Secondary | ICD-10-CM | POA: Diagnosis not present

## 2017-02-23 DIAGNOSIS — I4891 Unspecified atrial fibrillation: Secondary | ICD-10-CM | POA: Diagnosis not present

## 2017-02-23 DIAGNOSIS — I272 Pulmonary hypertension, unspecified: Secondary | ICD-10-CM | POA: Diagnosis not present

## 2017-02-23 DIAGNOSIS — L988 Other specified disorders of the skin and subcutaneous tissue: Secondary | ICD-10-CM | POA: Diagnosis not present

## 2017-02-23 DIAGNOSIS — I130 Hypertensive heart and chronic kidney disease with heart failure and stage 1 through stage 4 chronic kidney disease, or unspecified chronic kidney disease: Secondary | ICD-10-CM | POA: Diagnosis not present

## 2017-02-23 DIAGNOSIS — I4510 Unspecified right bundle-branch block: Secondary | ICD-10-CM | POA: Diagnosis not present

## 2017-02-23 DIAGNOSIS — C20 Malignant neoplasm of rectum: Secondary | ICD-10-CM | POA: Diagnosis not present

## 2017-02-23 DIAGNOSIS — D631 Anemia in chronic kidney disease: Secondary | ICD-10-CM | POA: Diagnosis not present

## 2017-02-23 DIAGNOSIS — I2510 Atherosclerotic heart disease of native coronary artery without angina pectoris: Secondary | ICD-10-CM | POA: Diagnosis not present

## 2017-02-23 LAB — BASIC METABOLIC PANEL
BUN / CREAT RATIO: 15 (ref 10–24)
BUN: 23 mg/dL (ref 8–27)
CALCIUM: 9 mg/dL (ref 8.6–10.2)
CO2: 28 mmol/L (ref 20–29)
CREATININE: 1.49 mg/dL — AB (ref 0.76–1.27)
Chloride: 100 mmol/L (ref 96–106)
GFR, EST AFRICAN AMERICAN: 55 mL/min/{1.73_m2} — AB (ref >59)
GFR, EST NON AFRICAN AMERICAN: 48 mL/min/{1.73_m2} — AB (ref >59)
Glucose: 97 mg/dL (ref 65–99)
Potassium: 3.7 mmol/L (ref 3.5–5.2)
Sodium: 144 mmol/L (ref 134–144)

## 2017-02-23 NOTE — Telephone Encounter (Signed)
Confirmed with staff, previous note regarding BP medication created by mistake. Will ignore.   Patrick [name]

## 2017-02-23 NOTE — Telephone Encounter (Signed)
-----   Message from [place], [place] sent at 02/23/2017  4:16 PM EDT ----- I have called the patient myself, currently on 80mg  daily of torsemide, will change to 60mg  daily as he appears to be dehydrated. Plan for repeat BMET in a week, I have placed the order.

## 2017-02-24 ENCOUNTER — Telehealth: Payer: Self-pay | Admitting: Hematology

## 2017-02-24 ENCOUNTER — Ambulatory Visit (HOSPITAL_COMMUNITY): Payer: Medicare Other

## 2017-02-24 NOTE — Telephone Encounter (Signed)
Patient called to cancelled appt for 10/3  With YF due to not getting an CT yet. Michela Pitcher he will call back when ct is scheduled.

## 2017-02-27 ENCOUNTER — Ambulatory Visit: Payer: Self-pay | Admitting: Internal Medicine

## 2017-02-27 DIAGNOSIS — I2510 Atherosclerotic heart disease of native coronary artery without angina pectoris: Secondary | ICD-10-CM | POA: Diagnosis not present

## 2017-02-27 DIAGNOSIS — I4510 Unspecified right bundle-branch block: Secondary | ICD-10-CM | POA: Diagnosis not present

## 2017-02-27 DIAGNOSIS — I255 Ischemic cardiomyopathy: Secondary | ICD-10-CM | POA: Diagnosis not present

## 2017-02-27 DIAGNOSIS — I272 Pulmonary hypertension, unspecified: Secondary | ICD-10-CM | POA: Diagnosis not present

## 2017-02-27 DIAGNOSIS — D631 Anemia in chronic kidney disease: Secondary | ICD-10-CM | POA: Diagnosis not present

## 2017-02-27 DIAGNOSIS — Z433 Encounter for attention to colostomy: Secondary | ICD-10-CM | POA: Diagnosis not present

## 2017-02-27 DIAGNOSIS — I130 Hypertensive heart and chronic kidney disease with heart failure and stage 1 through stage 4 chronic kidney disease, or unspecified chronic kidney disease: Secondary | ICD-10-CM | POA: Diagnosis not present

## 2017-02-27 DIAGNOSIS — N183 Chronic kidney disease, stage 3 (moderate): Secondary | ICD-10-CM | POA: Diagnosis not present

## 2017-02-27 DIAGNOSIS — L988 Other specified disorders of the skin and subcutaneous tissue: Secondary | ICD-10-CM | POA: Diagnosis not present

## 2017-02-27 DIAGNOSIS — I5042 Chronic combined systolic (congestive) and diastolic (congestive) heart failure: Secondary | ICD-10-CM | POA: Diagnosis not present

## 2017-02-27 DIAGNOSIS — I4891 Unspecified atrial fibrillation: Secondary | ICD-10-CM | POA: Diagnosis not present

## 2017-02-27 DIAGNOSIS — C20 Malignant neoplasm of rectum: Secondary | ICD-10-CM | POA: Diagnosis not present

## 2017-02-28 ENCOUNTER — Ambulatory Visit: Payer: Self-pay | Admitting: Hematology

## 2017-03-01 ENCOUNTER — Ambulatory Visit: Payer: Self-pay | Admitting: Hematology

## 2017-03-01 DIAGNOSIS — I5042 Chronic combined systolic (congestive) and diastolic (congestive) heart failure: Secondary | ICD-10-CM | POA: Diagnosis not present

## 2017-03-01 DIAGNOSIS — I130 Hypertensive heart and chronic kidney disease with heart failure and stage 1 through stage 4 chronic kidney disease, or unspecified chronic kidney disease: Secondary | ICD-10-CM | POA: Diagnosis not present

## 2017-03-01 DIAGNOSIS — I255 Ischemic cardiomyopathy: Secondary | ICD-10-CM | POA: Diagnosis not present

## 2017-03-01 DIAGNOSIS — I2510 Atherosclerotic heart disease of native coronary artery without angina pectoris: Secondary | ICD-10-CM | POA: Diagnosis not present

## 2017-03-01 DIAGNOSIS — C20 Malignant neoplasm of rectum: Secondary | ICD-10-CM | POA: Diagnosis not present

## 2017-03-01 DIAGNOSIS — D631 Anemia in chronic kidney disease: Secondary | ICD-10-CM | POA: Diagnosis not present

## 2017-03-01 DIAGNOSIS — I4891 Unspecified atrial fibrillation: Secondary | ICD-10-CM | POA: Diagnosis not present

## 2017-03-01 DIAGNOSIS — I272 Pulmonary hypertension, unspecified: Secondary | ICD-10-CM | POA: Diagnosis not present

## 2017-03-01 DIAGNOSIS — Z433 Encounter for attention to colostomy: Secondary | ICD-10-CM | POA: Diagnosis not present

## 2017-03-01 DIAGNOSIS — N183 Chronic kidney disease, stage 3 (moderate): Secondary | ICD-10-CM | POA: Diagnosis not present

## 2017-03-01 DIAGNOSIS — I4510 Unspecified right bundle-branch block: Secondary | ICD-10-CM | POA: Diagnosis not present

## 2017-03-01 DIAGNOSIS — L988 Other specified disorders of the skin and subcutaneous tissue: Secondary | ICD-10-CM | POA: Diagnosis not present

## 2017-03-06 ENCOUNTER — Ambulatory Visit
Admission: RE | Admit: 2017-03-06 | Discharge: 2017-03-06 | Disposition: A | Discharge status: Home / Self Care | Payer: Medicare Other | Source: Ambulatory Visit | Attending: Urology | Admitting: Urology

## 2017-03-06 DIAGNOSIS — C642 Malignant neoplasm of left kidney, except renal pelvis: Secondary | ICD-10-CM

## 2017-03-06 DIAGNOSIS — C189 Malignant neoplasm of colon, unspecified: Secondary | ICD-10-CM | POA: Diagnosis not present

## 2017-03-06 MED ORDER — GADOBENATE DIMEGLUMINE 529 MG/ML IV SOLN
19.0000 mL | Freq: Once | INTRAVENOUS | Status: AC | PRN
  Administered 2017-03-06: 19 mL via INTRAVENOUS

## 2017-03-07 ENCOUNTER — Ambulatory Visit (INDEPENDENT_AMBULATORY_CARE_PROVIDER_SITE_OTHER): Payer: Medicare Other | Admitting: Internal Medicine

## 2017-03-07 VITALS — BP 117/70 | HR 68 | Temp 96.5°F | Wt 201.8 lb

## 2017-03-07 DIAGNOSIS — R2689 Other abnormalities of gait and mobility: Secondary | ICD-10-CM

## 2017-03-07 DIAGNOSIS — F331 Major depressive disorder, recurrent, moderate: Secondary | ICD-10-CM

## 2017-03-07 DIAGNOSIS — Z23 Encounter for immunization: Secondary | ICD-10-CM

## 2017-03-07 NOTE — Assessment & Plan Note (Signed)
Having balance issues and weakness for the last few weeks. No red flags such as unilateral weakness, vision changes, numbness, or slurred speech that would indicate a CVA. Not having any vertigo. - Referral placed to PT - Return precautions discussed

## 2017-03-07 NOTE — Progress Notes (Signed)
   [facility] Phone: [phone]  Subjective:  Patrick [name] is a 66 year old male presenting to clinic for follow-up of depression and to discuss balance issues.  Depression: Going much better after starting Wellbutrin. Tolerating the Wellbutrin without any side effects. Sleeping well. Difficulty with concentrating also improving.  Balance Issues: Patient endorsing generalized weakness and "feeling off balance" for the last few weeks. Nothing caused this to happen. No recent illness that may have contributed to weakness. Denies any dizziness, syncope, or feeling like the room is spinning. Does note that he occasionally feels like he might pass out if he stands up too fast first thing in the morning. No falls, but states "he has come pretty close". No unilateral weakness, no numbness, no changes in vision or hearing.  ROS: See HPI for pertinent positives and negatives  Past Medical History- chronic A-fib, chronic systolic heart failure with ischemic cardiomyopathy, pulmonary HTN, HTN, hx rectal adenocarcinoma, CKD III, HLD, depression, macular degeneration.  Family history reviewed for today's visit. No changes.  Social history- patient is a former smoker  Objective: BP 117/70   Pulse 68   Temp (!) 96.5 F (35.8 C)   Wt 201 lb 12.8 oz (91.5 kg)   SpO2 93%   BMI 29.80 kg/m  Gen: NAD, alert, cooperative with exam HEENT: NCAT, EOMI, MMM Neck: FROM, supple CV: Irregularly irregular rhythm, regular rate Resp: CTABL, no wheezes, normal work of breathing Neuro: Standing normally without swaying, gait normal with cane use. Psych: Normal rate of speech, normal affect, normal thought content, normal judgement   Assessment/Plan: Depression: Last PHQ-9 score was 14 on 01/09/17. Repeat PHQ-9 score today was 9. Improved with Wellbutrin. - Continue Wellbutrin 100mg  bid - Follow-up in 3 months  Balance Issues: Having balance issues and weakness for the last few weeks. No red  flags such as unilateral weakness, vision changes, numbness, or slurred speech that would indicate a CVA. Not having any vertigo. - Referral placed to PT - Return precautions discussed   Hyman Bible, MD PGY-3

## 2017-03-07 NOTE — Assessment & Plan Note (Signed)
Last PHQ-9 score was 14 on 01/09/17. Repeat PHQ-9 score today was 9. Improved with Wellbutrin. - Continue Wellbutrin 100mg  bid - Follow-up in 3 months

## 2017-03-07 NOTE — Patient Instructions (Signed)
It was so nice to see you!  I have sent in a referral to physical therapy. You should hear from our office to schedule this appointment.  Let's keep your Wellbutrin at the same dose for now. We will continue to keep an eye on this.  We will see you back in 3 months!  -Dr. Brett Albino

## 2017-03-08 ENCOUNTER — Telehealth: Payer: Self-pay

## 2017-03-08 NOTE — Telephone Encounter (Signed)
Error

## 2017-03-08 NOTE — Telephone Encounter (Signed)
-----   Message from Festus Aloe, MD sent at 03/08/2017  1:45 PM EDT ----- This looks like a [facility] patient.   ----- Message ----- From: Royanne Foots, [place]: 03/07/2017  11:34 AM To: Festus Aloe, MD    ----- Message ----- From: Interface, Rad Results In Sent: 03/06/2017   2:21 PM To: Rowe Robert Clinical

## 2017-03-10 DIAGNOSIS — D3001 Benign neoplasm of right kidney: Secondary | ICD-10-CM | POA: Diagnosis not present

## 2017-03-10 DIAGNOSIS — R339 Retention of urine, unspecified: Secondary | ICD-10-CM | POA: Diagnosis not present

## 2017-03-13 ENCOUNTER — Other Ambulatory Visit: Payer: Self-pay | Admitting: Internal Medicine

## 2017-03-22 DIAGNOSIS — E43 Unspecified severe protein-calorie malnutrition: Secondary | ICD-10-CM | POA: Diagnosis not present

## 2017-03-22 DIAGNOSIS — K435 Parastomal hernia without obstruction or  gangrene: Secondary | ICD-10-CM | POA: Diagnosis not present

## 2017-03-22 DIAGNOSIS — R338 Other retention of urine: Secondary | ICD-10-CM | POA: Diagnosis not present

## 2017-03-22 DIAGNOSIS — Z933 Colostomy status: Secondary | ICD-10-CM | POA: Diagnosis not present

## 2017-03-22 DIAGNOSIS — C20 Malignant neoplasm of rectum: Secondary | ICD-10-CM | POA: Diagnosis not present

## 2017-03-23 ENCOUNTER — Telehealth: Payer: Self-pay | Admitting: *Deleted

## 2017-03-23 NOTE — Telephone Encounter (Signed)
Case manager is calling to discuss patient with you.  He will likely need a catheter the rest of his life.  Pt needs supplies and has been calling [facility] for these.  He is no longer homebound so was discharged from [facility], so they cant supply him with these.  She would like the MD to call her because she can get him set up with [employer] supplies.  You can reach her @ [phone]. [name], [name], CMA

## 2017-03-24 NOTE — Telephone Encounter (Signed)
Returned phone call. They will have the company send me a form to fill out to help patient get the catheter supplies.

## 2017-03-29 ENCOUNTER — Ambulatory Visit
Admission: RE | Admit: 2017-03-29 | Discharge: 2017-03-29 | Disposition: A | Discharge status: Home / Self Care | Payer: Medicare Other | Source: Ambulatory Visit | Attending: Interventional Radiology | Admitting: Interventional Radiology

## 2017-03-29 DIAGNOSIS — N2889 Other specified disorders of kidney and ureter: Secondary | ICD-10-CM | POA: Diagnosis not present

## 2017-03-29 HISTORY — PX: IR RADIOLOGIST EVAL & MGMT: IMG5224

## 2017-03-29 NOTE — Progress Notes (Signed)
Chief Complaint: Patient was seen in consultation today for  Chief Complaint  Patient presents with  . Follow-up    9 mo follow up Surveillance of Left Renal Mass     at the request of [place]  Referring Physician(s): Corrie Mckusick  Supervising Physician: Corrie Mckusick  History of Present Illness: Patrick [name] is a 66 y.o. male who is here today for follow up of left renal mass.  He underwent colon resection and Hartman procedure for colon cancer.  He tells me he feels "terrible".  He hates that he has a colostomy now.  He also tells me he has a foley catheter in place now due to 'prostate problems'.  He denies any fever/chills.   MRI shows the left renal lesion is unchanged since last year.  Past Medical History:  Diagnosis Date  . Arthritis    "right hand; right knee" (06/19/2014)  . CAD (coronary artery disease)    2v CAD with subtotal occlusion of small co-dominant RCA and borderline lesion in moderate-sized OM-1  . CHF (congestive heart failure) (HCC)    Preserved EF  . Coronary artery disease involving native coronary artery of native heart with angina pectoris ([place]) 09/11/2016  . Degenerative joint disease of knee, right   . Dysrhythmia   . Enlarged prostate   . Hyperlipidemia   . Hypertension   . Pneumonia 05/2014  . Prediabetes 10/03/2014  . Scrotal edema 04/03/2015  . SMALL BOWEL OBSTRUCTION, HX OF 08/21/2007   Annotation: with narrowing in the ileocecal region Qualifier: Diagnosis of  By: Hassell Done FNP, Tori Milks      Past Surgical History:  Procedure Laterality Date  . CARDIAC CATHETERIZATION N/A 02/16/2016   Procedure: Right/Left Heart Cath and Coronary Angiography;  Surgeon: Jolaine Artist, MD;  Location: [facility];  Service: Cardiovascular;  Laterality: N/A;  . COLONOSCOPY N/A 04/01/2016   Procedure: COLONOSCOPY;  Surgeon: Leighton Ruff, MD;  Location: [facility];  Service: Endoscopy;  Laterality: N/A;  . INGUINAL HERNIA REPAIR Left  1990's  . IR GENERIC HISTORICAL  07/05/2016   IR RADIOLOGIST EVAL & MGMT 07/05/2016 Corrie Mckusick, DO [facility] INTERV RAD  . XI ROBOT ABDOMINAL PERINEAL RESECTION N/A 09/28/2016   Procedure: XI ROBOT ABDOMINOPERINEAL RESECTION WITH PERMANENT COLOSTOMY ERAS PATHWAY;  Surgeon: Michael Boston, MD;  Location: [facility];  Service: General;  Laterality: N/A;    Allergies: Nsaids  Medications: Prior to Admission medications   Medication Sig Start Date End Date Taking? Authorizing Provider  acetaminophen (TYLENOL) 500 MG tablet Take 1,000 mg by mouth every 6 (six) hours as needed (for pain/fever/headaches.).    Yes [provider]  allopurinol (ZYLOPRIM) 300 MG tablet Take 1 tablet (300 mg total) by mouth daily. 12/26/16  Yes Mayo, Pete Pelt, MD  apixaban (ELIQUIS) 5 MG TABS tablet Take 1 tablet (5 mg total) by mouth 2 (two) times daily. 01/26/17  Yes Minus Breeding, MD  Ascorbic Acid (VITAMIN C) 1000 MG tablet Take 2,000 mg by mouth 2 (two) times daily.   Yes [provider]  buPROPion (WELLBUTRIN) 100 MG tablet take 1 tablet by mouth twice a day 03/14/17  Yes Mikell, Jeani Sow, MD  carvedilol (COREG) 25 MG tablet take 1 tablets by mouth twice a day 02/21/17  Yes Minus Breeding, MD  colchicine 0.6 MG tablet Take 0.6 mg by mouth 2 (two) times daily as needed.   Yes [provider]  ferrous sulfate 325 (65 FE) MG tablet Take 1 tablet (325 mg  total) by mouth 2 (two) times daily with a meal. 12/26/16  Yes Mayo, Pete Pelt, MD  finasteride (PROSCAR) 5 MG tablet take 1 tablet by mouth once daily 03/14/17  Yes Mikell, Jeani Sow, MD  losartan (COZAAR) 50 MG tablet Take 1 tablet (50 mg total) by mouth daily. 12/09/16  Yes Mayo, Pete Pelt, MD  lovastatin (MEVACOR) 20 MG tablet Take 1 tablet (20 mg total) by mouth at bedtime. 12/26/16  Yes Mayo, Pete Pelt, MD  mirtazapine (REMERON) 30 MG tablet Take 1 tablet (30 mg total) by mouth at bedtime. 10/19/16  Yes Truitt Merle, MD  Multiple  Vitamins-Minerals (MULTIVITAMIN WITH MINERALS) tablet Take 1 tablet by mouth daily. Men's 50+ Multivitamin   Yes [provider]  nitroGLYCERIN (NITROSTAT) 0.4 MG SL tablet Place 1 tablet (0.4 mg total) under the tongue every 5 (five) minutes as needed for chest pain. 04/29/16 08/04/17 Yes Barrett, Evelene Croon, PA-C  oxyCODONE-acetaminophen (PERCOCET/ROXICET) 5-325 MG tablet Take 1 tablet by mouth every 6 (six) hours as needed for severe pain.   Yes [provider]  potassium chloride SA (K-DUR,KLOR-CON) 20 MEQ tablet Take 60 mEq by mouth every morning. And 20 meq by mouth in the evening   Yes [provider]  tamsulosin (FLOMAX) 0.4 MG CAPS capsule Take 0.4 mg by mouth daily.    Yes [provider]  torsemide (DEMADEX) 20 MG tablet Take 20 mg by mouth daily after supper. TAKE 3 TABLETS (60MG ) IN THE AFTERNOON   Yes [provider]  traZODone (DESYREL) 100 MG tablet Take 3 tablets (300 mg total) by mouth at bedtime. 01/02/17  Yes Mayo, Pete Pelt, MD  methocarbamol (ROBAXIN) 750 MG tablet Take 1 tablet (750 mg total) by mouth 4 (four) times daily as needed (use for muscle cramps/pain). Patient not taking: Reported on 03/29/2017 09/28/16   Michael Boston, MD  Omega-3 1000 MG CAPS Take 2 capsules (2,000 mg total) by mouth 2 (two) times daily. Patient not taking: Reported on 03/29/2017 12/09/16   Mayo, Pete Pelt, MD     Family History  Problem Relation Age of Onset  . Hypertension Mother   . Diabetes Mother   . Stroke Mother   . Cancer Father 15       Prostate  . Dementia Father   . COPD Sister   . Arthritis Sister   . Edema Sister     Social History   Social History  . Marital status: Single    Spouse name: N/A  . Number of children: N/A  . Years of education: N/A   Social History Main Topics  . Smoking status: Former Smoker    Years: 5.00    Types: Pipe, Landscape architect  . Smokeless tobacco: Never Used     Comment: 06/19/2014 "stopped smoking in ~ 2014;  used to smoke a pipe or cigar a couple times/month"  . Alcohol use No  . Drug use: No  . Sexual activity: No   Other Topics Concern  . None   Social History Narrative   Single, lives w/his sister of whom he is caregiver      Review of Systems: A 12 point ROS discussed  Review of Systems  Constitutional: Positive for activity change, appetite change and fatigue.  HENT: Negative.   Respiratory: Negative.   Cardiovascular: Negative.   Gastrointestinal: Positive for abdominal pain.  Genitourinary: Negative.   Musculoskeletal: Negative.   Skin: Negative.   Neurological: Negative.   Hematological: Negative.   Psychiatric/Behavioral: Negative.  Vital Signs: BP (!) 156/86   Pulse (!) 59   Temp 97.8 F (36.6 C) (Oral)   Resp 16   Ht 5\' 9"  (1.753 m)   Wt 195 lb (88.5 kg)   SpO2 98%   BMI 28.80 kg/m   Physical Exam  Constitutional: He is oriented to person, place, and time. He appears well-developed.  HENT:  Head: Normocephalic and atraumatic.  Eyes: EOM are normal.  Neck: Normal range of motion.  Cardiovascular: Normal rate, regular rhythm and normal heart sounds.   No murmur heard. Pulmonary/Chest: Effort normal and breath sounds normal. No respiratory distress. He has no wheezes.  Abdominal: Soft.  Colostomy  Musculoskeletal: Normal range of motion.  Neurological: He is alert and oriented to person, place, and time.  Skin: Skin is warm and dry.  Psychiatric: He has a normal mood and affect. His behavior is normal. Judgment and thought content normal.  Vitals reviewed.     Imaging: Mr Abdomen Wwo Contrast  Addendum Date: 03/29/2017   ADDENDUM REPORT: 03/29/2017 08:51 ADDENDUM: Upon further review, there is enhancing renal cortical lesion in the lateral aspect of the mid LEFT kidney measuring 17 mm (image 34, series 13). This is not changed from 18 mm on comparison MRI from 04/17/2016. 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 Findings conveyed  Godfrey Pick 03/29/2017  at08:50. Electronically Signed   By: Suzy Bouchard M.D.   On: 03/29/2017 08:51   Result Date: 03/29/2017 CLINICAL DATA:  Rectal carcinoma.  renal cysts on ultrasound EXAM: MRI ABDOMEN WITHOUT AND WITH CONTRAST TECHNIQUE: Multiplanar multisequence MR imaging of the abdomen was performed both before and after the administration of intravenous contrast. CONTRAST:  68mL MULTIHANCE GADOBENATE DIMEGLUMINE 529 MG/ML IV SOLN COMPARISON:  Ultrasound 10/06/2016, MRI 04/17/2016 FINDINGS: Lower chest:  Lung bases are clear. Hepatobiliary: Liver has a lobular contour. The caudate lobe is enlarged. There is several large gallstones within lumen gallbladder. Note clear gallbladder inflammation. Pancreas: Normal pancreatic parenchymal intensity. No ductal dilatation or inflammation. Spleen: Normal spleen. Adrenals/urinary tract: Adrenal glands normal. The bilateral cortical cystic lesions which are hyperintense on T2 weighted imaging (series 3) and do not demonstrated post-contrast enhancement (series 13). One Cortical lesion the LEFT has high signal intensity on T1 weighted imaging (image 44, series 11) and without enhancement consistent with a hemorrhagic cyst. No suspicious cortical lesion. Stomach/Bowel: Stomach and limited of the small bowel is unremarkable. LEFT abdominal quadrant colostomy. Vascular/Lymphatic: Abdominal aortic normal caliber. No retroperitoneal periportal lymphadenopathy. Musculoskeletal: No aggressive osseous lesion IMPRESSION: 1. Benign Bosniak 1 and Bosniak 2 renal cysts. 2. Morphologic changes of liver suggests cirrhosis. 3. Cholelithiasis. 4. No evidence rectal carcinoma metastasis. Electronically Signed: By: Suzy Bouchard M.D. On: 03/06/2017 14:19    Labs:  CBC:  Recent Labs  11/09/16 11/11/16 12/22/16 1446 02/20/17 1459  WBC 10.0 7.6 6.7 4.3  HGB 15.4 11.3* 10.8* 10.9*  HCT 43 35* 34.7* 34.6*  PLT 264 154 113* 102*    COAGS:  Recent Labs   05/29/16 2111  INR 1.35    BMP:  Recent Labs  12/09/16 1415 12/16/16 1608 12/22/16 1446 01/26/17 1551 02/22/17 1524  NA 144 145* 143 143 144  K 4.7 5.2 4.3 4.9 3.7  CL 106 106  --  100 100  CO2 24 22 26 29 28   GLUCOSE 102* 87 104 87 97  BUN 19 16 16.2 24 23   CALCIUM 8.6 8.9 9.6 9.5 9.0  CREATININE 1.37* 1.35* 1.2 1.35* 1.49*  GFRNONAA 53*  54*  --  54* 48*  GFRAA 61 62  --  62 55*    LIVER FUNCTION TESTS:  Recent Labs  07/04/16 1413 10/06/16 1819 10/13/16 10/19/16 1039 11/11/16 12/22/16 1446  BILITOT 0.82 1.1  --  0.72  --  0.63  AST 16 21 45* 65* 16 13  ALT 11 18 72* 92* 18 9  ALKPHOS 99 65 154* 232* 157* 118  PROT 7.7 6.0*  --  7.2  --  7.6  ALBUMIN 3.8 2.8*  --  2.4*  --  3.2*    TUMOR MARKERS: No results for input(s): AFPTM, CEA, CA199, CHROMGRNA in the last 8760 hours.  Assessment:  Stable left renal lesion  Dr. Earleen Newport also saw patient today.  He does not wish to proceed with any treatment at this time.  Return in 1 year with imaging to follow renal lesion.   Electronically Signed: Murrell Redden PA-C 03/29/2017, 4:36 PM   Please refer to Dr. Pasty Arch attestation of this note for management and plan.

## 2017-04-02 ENCOUNTER — Other Ambulatory Visit: Payer: Self-pay | Admitting: Internal Medicine

## 2017-04-03 ENCOUNTER — Encounter: Payer: Self-pay | Admitting: Urology

## 2017-04-03 DIAGNOSIS — R338 Other retention of urine: Secondary | ICD-10-CM | POA: Diagnosis not present

## 2017-04-03 DIAGNOSIS — Z933 Colostomy status: Secondary | ICD-10-CM | POA: Diagnosis not present

## 2017-04-03 DIAGNOSIS — C20 Malignant neoplasm of rectum: Secondary | ICD-10-CM | POA: Diagnosis not present

## 2017-04-03 DIAGNOSIS — Z85038 Personal history of other malignant neoplasm of large intestine: Secondary | ICD-10-CM | POA: Diagnosis not present

## 2017-04-04 ENCOUNTER — Encounter: Payer: Self-pay | Admitting: Physical Therapy

## 2017-04-04 ENCOUNTER — Ambulatory Visit: Payer: Medicare Other | Attending: Family Medicine | Admitting: Physical Therapy

## 2017-04-04 DIAGNOSIS — R42 Dizziness and giddiness: Secondary | ICD-10-CM | POA: Insufficient documentation

## 2017-04-04 DIAGNOSIS — R262 Difficulty in walking, not elsewhere classified: Secondary | ICD-10-CM | POA: Diagnosis not present

## 2017-04-04 DIAGNOSIS — M6281 Muscle weakness (generalized): Secondary | ICD-10-CM | POA: Insufficient documentation

## 2017-04-04 DIAGNOSIS — H8111 Benign paroxysmal vertigo, right ear: Secondary | ICD-10-CM | POA: Diagnosis not present

## 2017-04-05 NOTE — Therapy (Signed)
[facility] [address] [place], [place], [address] Phone: [phone]   Fax:  [phone]  Physical Therapy Evaluation  Patient Details  Name: Patrick [name] MRN: [id number] Date of Birth: Oct 03, 1950 Referring Provider: Hyman Bible MD   Encounter Date: 04/04/2017  PT End of Session - 04/05/17 1341    Visit Number  1    Number of Visits  9    Date for PT Re-Evaluation  06/03/17    Authorization Type  UHC Medicare; [facility]    Authorization Time Period  04/04/17 to 06/03/17    PT Start Time  1446    PT Stop Time  1535    PT Time Calculation (min)  49 min    Activity Tolerance  Patient tolerated treatment well    Behavior During Therapy  [facility] for tasks assessed/performed       Past Medical History:  Diagnosis Date  . Arthritis    "right hand; right knee" (06/19/2014)  . CAD (coronary artery disease)    2v CAD with subtotal occlusion of small co-dominant RCA and borderline lesion in moderate-sized OM-1  . CHF (congestive heart failure) (HCC)    Preserved EF  . Coronary artery disease involving native coronary artery of native heart with angina pectoris ([place]) 09/11/2016  . Degenerative joint disease of knee, right   . Dysrhythmia   . Enlarged prostate   . Hyperlipidemia   . Hypertension   . Pneumonia 05/2014  . Prediabetes 10/03/2014  . Scrotal edema 04/03/2015  . SMALL BOWEL OBSTRUCTION, HX OF 08/21/2007   Annotation: with narrowing in the ileocecal region Qualifier: Diagnosis of  By: Hassell Done FNP, Tori Milks      Past Surgical History:  Procedure Laterality Date  . INGUINAL HERNIA REPAIR Left 1990's  . IR GENERIC HISTORICAL  07/05/2016   IR RADIOLOGIST EVAL & MGMT 07/05/2016 Corrie Mckusick, DO [facility] INTERV RAD    There were no vitals filed for this visit.   Subjective Assessment - 04/04/17 1455    Subjective  Last year diagnosed with colorectal cancer, had surgery early this year and got colostomy bag, also have foley  catheter because I couldn't urinate. Went to SNF for rehab and got gout in both knees. Started doing rehab finally and then another round of gout started and just never got "me back. "     Patient Stated Goals  Feel stronger and better balance    Currently in Pain?  Yes    Pain Score  6     Pain Location  Leg flank   flank   Pain Orientation  Right;Proximal    Pain Descriptors / Indicators  Aching    Pain Type  Chronic pain    Pain Onset  More than a month ago    Pain Frequency  Intermittent    Aggravating Factors   weather, lifting heavy stuff    Pain Relieving Factors  tylenol, rest    Effect of Pain on Daily Activities  Has to push through because he is taking care of his sister (currently she is in a SNF)         [facility] PT Assessment - 04/04/17 1502      Assessment   Medical Diagnosis  balance problem    Referring Provider  Hyman Bible MD    Onset Date/Surgical Date  03/07/17    Prior Therapy  not for this condition      Precautions   Precautions  Fall  Restrictions   Weight Bearing Restrictions  No      Balance Screen   Has the patient fallen in the past 6 months  No + near falls   + near falls   Has the patient had a decrease in activity level because of a fear of falling?   Yes    Is the patient reluctant to leave their home because of a fear of falling?   Yes      [place] residence    Living Arrangements  Other relatives sister   sister   Type of [place] entrance    [place] - single point      Prior Function   Level of [place]  Retired    Biomedical scientist  worked Firefighter   Overall Cognitive Status  Impaired/Different from baseline a little more forgetful per pt   a little more forgetful per pt     Observation/Other Assessments   Observations  walks to exam room with use of SPC and guarded, slow  manner      Coordination   Gross Motor Movements are Fluid and Coordinated  Yes      Posture/Postural Control   Posture/Postural Control  Postural limitations    Postural Limitations  Forward head;Decreased lumbar lordosis      ROM / Strength   AROM / PROM / Strength  AROM;Strength      AROM   Overall AROM   Within functional limits for tasks performed      Strength   Overall Strength  Deficits    Overall Strength Comments  grossly assessed through functional mobility (cannot sit to stand without use of hands)      Transfers   Five time sit to stand comments   15.4 norm 8.4 sec   norm 8.4 sec     Ambulation/Gait   Ambulation/Gait  Yes    Ambulation/Gait Assistance  6: Modified independent (Device/Increase time)    Ambulation Distance (Feet)  60 Feet 35, 40   35, 40   Assistive device  Straight cane    Gait Pattern  Step-through pattern;Decreased arm swing - right;Decreased arm swing - left;Decreased stride length;Decreased trunk rotation;Trunk flexed;Antalgic    Ambulation Surface  Indoor    Gait velocity  32.8/17.25= 1.90 ft/sec norm 3.11 ft/sec   norm 3.11 ft/sec     Standardized Balance Assessment   Standardized Balance Assessment  Timed Up and Go Test;Berg Balance Test      Berg Balance Test   Sit to Stand  Able to stand  independently using hands    Standing Unsupported  Able to stand 2 minutes with supervision    Sitting with Back Unsupported but Feet Supported on Floor or Stool  Able to sit safely and securely 2 minutes    Stand to Sit  Sits safely with minimal use of hands    Transfers  Able to transfer safely, minor use of hands    Berg comment:  remainder of test deferred as pt mentioned spinning-type dizziness and vestibular assessment initiated      Timed Up and Go Test   Normal TUG (seconds)  22.9    Cognitive TUG (seconds)  26.41    TUG Comments  Normal TUG >13.5 seconds = high fall risk; Cognitive >15  sec = high fall risk         Vestibular  Assessment - 04/05/17 0001      Symptom Behavior   Type of Dizziness  Spinning    Frequency of Dizziness  daily    Duration of Dizziness  pt unsure if lasts more than one minute    Aggravating Factors  Supine to sit;Sit to stand;Forward bending      Positional Testing   Dix-Hallpike  Dix-Hallpike Right;Dix-Hallpike Left    Horizontal Canal Testing  Horizontal Canal Right;Horizontal Canal Left      Dix-Hallpike Right   Dix-Hallpike Right Duration  15 seconds    Dix-Hallpike Right Symptoms  Upbeat, right rotatory nystagmus      Dix-Hallpike Left   Dix-Hallpike Left Duration  0    Dix-Hallpike Left Symptoms  No nystagmus      Horizontal Canal Right   Horizontal Canal Right Duration  0    Horizontal Canal Right Symptoms  Normal      Horizontal Canal Left   Horizontal Canal Left Duration  0    Horizontal Canal Left Symptoms  Normal         Objective measurements completed on examination: See above findings.       Vestibular Treatment/Exercise - 04/05/17 0001      Vestibular Treatment/Exercise   Vestibular Treatment Provided  Canalith Repositioning    Canalith Repositioning  Epley Manuever Right       EPLEY MANUEVER RIGHT   Number of Reps   1    Overall Response  Improved Symptoms    Response Details   horizontal roll test assessed prior to Epley; symptoms much less on side to sit and sit to stand after Epley; did not have time to repeat/reassess or teach Brandt-Daroff             PT Education - 04/05/17 1340    Education provided  Yes    Education Details  3 parts to balance system; anatomy and physiology of the inner ear and what BPPV is; results of full evaluation    Person(s) Educated  Patient    Methods  Explanation    Comprehension  Verbalized understanding;Need further instruction          PT Long Term Goals - 04/05/17 1358      PT LONG TERM GOAL #1   Title  Pt with negative positional testing for BPPV (Target for all LTGs 05/11/17)    Time  4     Period  Weeks    Status  New      PT LONG TERM GOAL #2   Title  Patient will be independent with HEP for balance and strengthening    Time  4    Period  Weeks    Status  New      PT LONG TERM GOAL #3   Title  Pateint will improve gait velocity to >=2.20 ft/sec (indicative of lesser fall risk and approaching safe community gait velocity)    Time  4    Period  Weeks    Status  New      PT LONG TERM GOAL #4   Title  Complete Berg or FGA and set appropriate 4 week goal.    Time  1    Period  Weeks    Status  New             Plan - 04/05/17 1343    Clinical Impression Statement  Patient referred to PT  by his PCP due to poor balance and near falls. Ultimately patient revealed that he has been having "spinning" for many months. Assessed and +rt posterior canalithiasis. Due to limited timeframe was unable to re-test Dix-Hallpike after treatment, however patient noted much less severe spinning as standing to leave. Anticipate due to length of time patient has been having symptoms he may also have developed a hypofunction. Patient will benefit from continued vestibular assessment and treatment. He will also benefit from the other interventions listed below for the deficits listed below.     History and Personal Factors relevant to plan of care:  PMH-recent prolonged illness and decline in his activity level; HTN; macular degeneration; CHF; afib    Clinical Presentation  Evolving    Clinical Presentation due to:  continued to have spinning (altough less) at end of session    Clinical Decision Making  Moderate    Rehab Potential  Good    PT Frequency  2x / week    PT Duration  4 weeks    PT Treatment/Interventions  ADLs/Self Care Home Management;Canalith Repostioning;DME Instruction;Balance training;Therapeutic exercise;Therapeutic activities;Functional mobility training;Gait training;Neuromuscular re-education;Patient/family education;Vestibular;Visual/perceptual  remediation/compensation    PT Next Visit Plan  recheck rt Hallpike-Dix; finish Berg testing from eval vs FGA and set LTG; begin HEP for balance/strength    Consulted and Agree with Plan of Care  Patient       Patient will benefit from skilled therapeutic intervention in order to improve the following deficits and impairments:  Decreased activity tolerance, Decreased balance, Decreased knowledge of use of DME, Decreased mobility, Difficulty walking, Decreased strength, Dizziness, Postural dysfunction, Other (comment)(vertigo)  Visit Diagnosis: BPPV (benign paroxysmal positional vertigo), right - Plan: PT plan of care cert/re-cert  Dizziness and giddiness - Plan: PT plan of care cert/re-cert  Difficulty in walking, not elsewhere classified - Plan: PT plan of care cert/re-cert  Muscle weakness (generalized) - Plan: PT plan of care cert/re-cert  G-Codes - 36/64/40 1408    Functional Limitation  Mobility: Walking and moving around    Mobility: Walking and Moving Around Current Status [phone])  At least 20 percent but less than 40 percent impaired, limited or restricted    Mobility: Walking and Moving Around Goal Status ([id number])  At least 1 percent but less than 20 percent impaired, limited or restricted        Problem List Patient Active Problem List   Diagnosis Date Noted  . Balance problem 03/07/2017  . Protein-calorie malnutrition, severe ([place]) 12/20/2016  . Pulmonary HTN ([place]) 11/10/2016  . History of resection of rectum 09/28/2016  . Rectal cancer ([place]) 09/28/2016  . Atrial fibrillation ([place]) 09/11/2016  . Coronary artery disease involving native coronary artery of native heart with angina pectoris ([place]) 09/11/2016  . Nonischemic cardiomyopathy ([place]) 09/11/2016  . Thrombocytopenia ([place]) 05/29/2016  . Adenocarcinoma of rectum s/p robotic APR/colostomy 09/28/2016 04/14/2016  . Chronic kidney disease (CKD), stage III (moderate) ([place]) 02/05/2016  . Abnormal finding on cardiovascular  stress test 02/05/2016  . Cardiomyopathy, ischemic 06/17/2015  . Chronic systolic heart failure ([place]) 06/17/2015  . Major depressive disorder, recurrent episode ([place]) 04/17/2015  . Other iron deficiency anemias   . Health care maintenance 03/11/2014  . Obesity, unspecified 10/03/2013  . Dyslipidemia 08/13/2013  . MACULAR DEGENERATION 05/02/2008  . ERECTILE DYSFUNCTION 03/13/2007  . Essential hypertension 01/16/2007  . BPH (benign prostatic hyperplasia) 01/16/2007    Jeanie Cooks Salmaan Patchin. PT 04/05/2017, 2:17 PM  [facility] [address]  [place], [place], [address] Phone: [phone]   Fax:  [phone]  Name: Patrick [name] MRN: [id number] Date of Birth: 1950/09/05

## 2017-04-11 ENCOUNTER — Ambulatory Visit: Payer: Medicare Other

## 2017-04-11 DIAGNOSIS — H8111 Benign paroxysmal vertigo, right ear: Secondary | ICD-10-CM | POA: Diagnosis not present

## 2017-04-11 DIAGNOSIS — R262 Difficulty in walking, not elsewhere classified: Secondary | ICD-10-CM

## 2017-04-11 DIAGNOSIS — M6281 Muscle weakness (generalized): Secondary | ICD-10-CM

## 2017-04-11 DIAGNOSIS — R339 Retention of urine, unspecified: Secondary | ICD-10-CM | POA: Diagnosis not present

## 2017-04-11 DIAGNOSIS — D3001 Benign neoplasm of right kidney: Secondary | ICD-10-CM | POA: Diagnosis not present

## 2017-04-11 DIAGNOSIS — R42 Dizziness and giddiness: Secondary | ICD-10-CM | POA: Diagnosis not present

## 2017-04-11 DIAGNOSIS — N401 Enlarged prostate with lower urinary tract symptoms: Secondary | ICD-10-CM | POA: Diagnosis not present

## 2017-04-11 NOTE — Therapy (Signed)
[facility] [address] [place], [place], [address] Phone: [phone]   Fax:  [phone]  Physical Therapy Treatment  Patient Details  Name: Patrick [name] MRN: [id number] Date of Birth: 1951/01/20 Referring Provider: Hyman Bible MD   Encounter Date: 04/11/2017  PT End of Session - 04/11/17 1412    Visit Number  2    Number of Visits  9    Date for PT Re-Evaluation  06/03/17    Authorization Type  UHC Medicare; [facility]    Authorization Time Period  04/04/17 to 06/03/17    PT Start Time  1318    PT Stop Time  1359    PT Time Calculation (min)  41 min    Equipment Utilized During Treatment  Gait belt    Activity Tolerance  Patient tolerated treatment well    Behavior During Therapy  [facility] for tasks assessed/performed       Past Medical History:  Diagnosis Date  . Arthritis    "right hand; right knee" (06/19/2014)  . CAD (coronary artery disease)    2v CAD with subtotal occlusion of small co-dominant RCA and borderline lesion in moderate-sized OM-1  . CHF (congestive heart failure) (HCC)    Preserved EF  . Coronary artery disease involving native coronary artery of native heart with angina pectoris ([place]) 09/11/2016  . Degenerative joint disease of knee, right   . Dysrhythmia   . Enlarged prostate   . Hyperlipidemia   . Hypertension   . Pneumonia 05/2014  . Prediabetes 10/03/2014  . Scrotal edema 04/03/2015  . SMALL BOWEL OBSTRUCTION, HX OF 08/21/2007   Annotation: with narrowing in the ileocecal region Qualifier: Diagnosis of  By: Hassell Done FNP, Tori Milks      Past Surgical History:  Procedure Laterality Date  . INGUINAL HERNIA REPAIR Left 1990's  . IR GENERIC HISTORICAL  07/05/2016   IR RADIOLOGIST EVAL & MGMT 07/05/2016 Corrie Mckusick, DO [facility] INTERV RAD    There were no vitals filed for this visit.  Subjective Assessment - 04/11/17 1321    Subjective  Pt reported dizziness has been much better and has been almost gone,  he has to sit up fast for dizziness to occur. Pt reports he's stiff today, he has good days and bad days (and reports today is a bad today).     Patient Stated Goals  Feel stronger and better balance    Currently in Pain?  Yes    Pain Score  6     Pain Location  Hip    Pain Orientation  Right    Pain Descriptors / Indicators  Aching    Pain Type  Chronic pain    Pain Radiating Towards  R hip/knee/LE    Pain Onset  More than a month ago    Pain Frequency  Constant    Aggravating Factors   weather, lifting    Pain Relieving Factors  Rest, tylenol    Multiple Pain Sites  Yes    Pain Score  3    Pain Location  Knee    Pain Orientation  Left    Pain Descriptors / Indicators  Aching    Pain Type  Chronic pain    Pain Onset  More than a month ago    Pain Frequency  Intermittent    Aggravating Factors   Lifting heavy objects, weather    Pain Relieving Factors  Rest, tylenol  Vestibular Assessment - 04/11/17 1326      Positional Testing   Dix-Hallpike  Dix-Hallpike Right      Dix-Hallpike Right   Dix-Hallpike Right Duration  <15 sec. Pt reported 7-8/10 dizziness.     Dix-Hallpike Right Symptoms  Upbeat, right rotatory nystagmus              [facility] Adult PT Treatment/Exercise - 04/11/17 1335      Standardized Balance Assessment   Standardized Balance Assessment  Berg Balance Test;Dynamic Gait Index      Berg Balance Test   Sit to Stand  Able to stand  independently using hands    Standing Unsupported  Able to stand safely 2 minutes    Sitting with Back Unsupported but Feet Supported on Floor or Stool  Able to sit safely and securely 2 minutes    Stand to Sit  Controls descent by using hands    Transfers  Able to transfer safely, definite need of hands    Standing Unsupported with Eyes Closed  Able to stand 10 seconds with supervision    Standing Ubsupported with Feet Together  Able to place feet together independently and stand for 1 minute with  supervision    From Standing, Reach Forward with Outstretched Arm  Can reach forward >12 cm safely (5") 7"    From Standing Position, Pick up Object from [place] to pick up shoe, needs supervision    From Standing Position, Turn to Look Behind Over each Shoulder  Looks behind one side only/other side shows less weight shift    Turn 360 Degrees  Needs close supervision or verbal cueing    Standing Unsupported, Alternately Place Feet on Step/Stool  Able to complete >2 steps/needs minimal assist    Standing Unsupported, One Foot in Front  Able to take small step independently and hold 30 seconds    Standing on One Leg  Tries to lift leg/unable to hold 3 seconds but remains standing independently    Total Score  37      Dynamic Gait Index   Level Surface  Mild Impairment    Change in Gait Speed  Moderate Impairment    Gait with Horizontal Head Turns  Mild Impairment    Gait with Vertical Head Turns  Mild Impairment    Gait and Pivot Turn  Moderate Impairment    Step Over Obstacle  Moderate Impairment    Step Around Obstacles  Moderate Impairment    Steps  Moderate Impairment    Total Score  11      Vestibular Treatment/Exercise - 04/11/17 1331      Vestibular Treatment/Exercise   Vestibular Treatment Provided  Canalith Repositioning    Canalith Repositioning  Epley Manuever Right       EPLEY MANUEVER RIGHT   Number of Reps   1    Overall Response  Symptoms Resolved    Response Details   Pt reported no dizziness and no nystagmus noted after treatment.             PT Education - 04/11/17 1411    Education provided  Yes    Education Details  PT discussed BPPV and s/s resolving today after treatment. PT educated pt on outcome measure results.     Person(s) Educated  Patient    Methods  Explanation    Comprehension  Verbalized understanding          PT Long Term Goals - 04/11/17 1413  PT LONG TERM GOAL #1   Title  Pt with negative positional testing for BPPV  (Target for all LTGs 05/11/17)    Time  4    Period  Weeks    Status  New      PT LONG TERM GOAL #2   Title  Patient will be independent with HEP for balance and strengthening    Time  4    Period  Weeks    Status  New      PT LONG TERM GOAL #3   Title  Pateint will improve gait velocity to >=2.20 ft/sec (indicative of lesser fall risk and approaching safe community gait velocity)    Time  4    Period  Weeks    Status  New      PT LONG TERM GOAL #4   Title  Complete Berg or FGA and set appropriate 4 week goal.    Time  1    Period  Weeks    Status  Achieved      PT LONG TERM GOAL #5   Title  Pt will improve BERG score to >/=45/56 to decr. falls risk.     Status  New      Additional Long Term Goals   Additional Long Term Goals  Yes      PT LONG TERM GOAL #6   Title  Pt will improve DGI score to >/=15/24 to decr. falls risk.     Baseline  FGA deferred due to impaired balance, DGI performed instead.     Status  New            Plan - 04/11/17 1412    Clinical Impression Statement  Pt demonstrated progress, as s/s of R pBPPV resolved after 1 treatment. Pt's DGI and BERG scores both indicate pt is at risk for falls. DGI performed vs. FGA, 2/2 impaired balance during gait and BERG test. Pt would continue to benefit from skilled PT to improve safety during functional mobility.     Rehab Potential  Good    PT Frequency  2x / week    PT Duration  4 weeks    PT Treatment/Interventions  ADLs/Self Care Home Management;Canalith Repostioning;DME Instruction;Balance training;Therapeutic exercise;Therapeutic activities;Functional mobility training;Gait training;Neuromuscular re-education;Patient/family education;Vestibular;Visual/perceptual remediation/compensation    PT Next Visit Plan  recheck rt Hallpike-Dix; HEP for balance/strength    Consulted and Agree with Plan of Care  Patient       Patient will benefit from skilled therapeutic intervention in order to improve the  following deficits and impairments:  Decreased activity tolerance, Decreased balance, Decreased knowledge of use of DME, Decreased mobility, Difficulty walking, Decreased strength, Dizziness, Postural dysfunction, Other (comment)(vertigo)  Visit Diagnosis: Dizziness and giddiness  BPPV (benign paroxysmal positional vertigo), right  Difficulty in walking, not elsewhere classified  Muscle weakness (generalized)     Problem List Patient Active Problem List   Diagnosis Date Noted  . Balance problem 03/07/2017  . Protein-calorie malnutrition, severe ([place]) 12/20/2016  . Pulmonary HTN ([place]) 11/10/2016  . History of resection of rectum 09/28/2016  . Rectal cancer ([place]) 09/28/2016  . Atrial fibrillation ([place]) 09/11/2016  . Coronary artery disease involving native coronary artery of native heart with angina pectoris ([place]) 09/11/2016  . Nonischemic cardiomyopathy ([place]) 09/11/2016  . Thrombocytopenia ([place]) 05/29/2016  . Adenocarcinoma of rectum s/p robotic APR/colostomy 09/28/2016 04/14/2016  . Chronic kidney disease (CKD), stage III (moderate) ([place]) 02/05/2016  . Abnormal finding on cardiovascular stress test 02/05/2016  . Cardiomyopathy,  ischemic 06/17/2015  . Chronic systolic heart failure ([place]) 06/17/2015  . Major depressive disorder, recurrent episode ([place]) 04/17/2015  . Other iron deficiency anemias   . Health care maintenance 03/11/2014  . Obesity, unspecified 10/03/2013  . Dyslipidemia 08/13/2013  . MACULAR DEGENERATION 05/02/2008  . ERECTILE DYSFUNCTION 03/13/2007  . Essential hypertension 01/16/2007  . BPH (benign prostatic hyperplasia) 01/16/2007    Darthy Manganelli L 04/11/2017, 2:15 PM  [facility] [address] [place], [place], [address] Phone: [phone]   Fax:  [phone]  Name: Patrick [name] MRN: [id number] Date of Birth: 1951/05/06  Geoffry Paradise, PT,DPT 04/11/17 2:15 PM Phone: [phone] Fax:  [phone]

## 2017-04-13 ENCOUNTER — Ambulatory Visit: Payer: Self-pay

## 2017-04-13 ENCOUNTER — Telehealth: Payer: Self-pay | Admitting: Cardiology

## 2017-04-13 NOTE — Telephone Encounter (Signed)
Anticoag clearance request received from Dr. Louis Meckel for upcoming TURP (procedure date not scheduled). Pt is on apixaban for hx of AFib with CHADSVASc of 4 (age >29, HTN, CAD, HF). Pt has noted renal insufficiency with last SCr 1.49 mg/dl and CrCl ~60 ml/min, okay to hold apixaban for 2-3 days prior to procedure. Clearance faxed to Dr. Carlton Adam office.

## 2017-04-13 NOTE — Telephone Encounter (Signed)
° °  [facility] Pre-operative Risk Assessment    Request for surgical clearance:  1. What type of surgery is being performed? TURP   2. When is this surgery scheduled? Pending   3. Are there any medications that need to be held prior to surgery and how long?General Cardiac Clearance-Can pt stop his Eliquis,If so,how long?   4. Practice name and name of physician performing surgery? Dr Louis Meckel   5. What is your office phone and fax number? [phone] and fax is [phone]   6. Anesthesia type (None, local, MAC, general) ? General   Patrick [name] 04/13/2017, 10:21 AM  _________________________________________________________________   (provider comments below)

## 2017-04-13 NOTE — Telephone Encounter (Signed)
   Chart reviewed as part of pre-operative protocol coverage. Given past medical history and time since last visit, based on ACC/AHA guidelines, HANLEY WOERNER would be at acceptable risk for the planned procedure without further cardiovascular testing. I will route to pharmacy for review of the patient's anticoagulation.   I will route this recommendation to the requesting party via Epic fax function and remove from pre-op pool.  Please call with questions.  Christell Faith, PA-C 04/13/2017, 12:59 PM

## 2017-04-14 ENCOUNTER — Ambulatory Visit: Payer: Medicare Other

## 2017-04-19 ENCOUNTER — Encounter: Payer: Self-pay | Admitting: Interventional Radiology

## 2017-04-24 ENCOUNTER — Ambulatory Visit: Payer: Medicare Other | Admitting: Physical Therapy

## 2017-04-25 ENCOUNTER — Telehealth: Payer: Self-pay | Admitting: Internal Medicine

## 2017-04-25 ENCOUNTER — Encounter: Payer: Self-pay | Admitting: Licensed Clinical Social Worker

## 2017-04-25 NOTE — Telephone Encounter (Signed)
Pt spoke with DSS and they should be faxing over a FL2 for PCP to fill out and fax back to get pt a placement in a facility. Please advise

## 2017-04-25 NOTE — Progress Notes (Signed)
LCSW spoke with patient and has started the FL2.  Will need recommended level of care from PCP to complete the FL2.  DSS Worker Henrene Pastor [phone] is working with patient for placement and will request the FL2 in Epic from medical records once it is completed and signed.  Casimer Lanius, LCSW Licensed Clinical Social Worker [place]   [phone] 11:13 AM

## 2017-04-25 NOTE — Telephone Encounter (Signed)
Will forward to MD to make aware. Jazmin Hartsell,CMA  

## 2017-04-25 NOTE — NC FL2 (Signed)
[place] LEVEL OF CARE SCREENING TOOL     IDENTIFICATION  Patient Name: Patrick [name] Birthdate: 03/04/1951 Sex: male Admission Date (Current Location):   [place] Number:  Herbalist and Address:  ([facility]  [address] [address])      Provider Number: [id number]  Attending Physician Name and Address:  Dr. Hyman Bible     [address] [place]  Relative Name and Phone Number:  Nero Sawatzky     Current Level of Care: Home Recommended Level of Care: [place] Prior Approval Number:    Date Approved/Denied:   PASRR Number:    Discharge Plan:      Current Diagnoses: Patient Active Problem List   Diagnosis Date Noted  . Balance problem 03/07/2017  . Protein-calorie malnutrition, severe ([place]) 12/20/2016  . Pulmonary HTN ([place]) 11/10/2016  . History of resection of rectum 09/28/2016  . Rectal cancer ([place]) 09/28/2016  . Atrial fibrillation ([place]) 09/11/2016  . Coronary artery disease involving native coronary artery of native heart with angina pectoris ([place]) 09/11/2016  . Nonischemic cardiomyopathy ([place]) 09/11/2016  . Thrombocytopenia ([place]) 05/29/2016  . Adenocarcinoma of rectum s/p robotic APR/colostomy 09/28/2016 04/14/2016  . Chronic kidney disease (CKD), stage III (moderate) ([place]) 02/05/2016  . Abnormal finding on cardiovascular stress test 02/05/2016  . Cardiomyopathy, ischemic 06/17/2015  . Chronic systolic heart failure ([place]) 06/17/2015  . Major depressive disorder, recurrent episode ([place]) 04/17/2015  . Other iron deficiency anemias   . Health care maintenance 03/11/2014  . Obesity, unspecified 10/03/2013  . Dyslipidemia 08/13/2013  . MACULAR DEGENERATION 05/02/2008  . ERECTILE DYSFUNCTION 03/13/2007  . Essential hypertension 01/16/2007  . BPH (benign prostatic hyperplasia) 01/16/2007    Orientation RESPIRATION BLADDER Height & Weight     Self, Time, Situation, Place  Normal Indwelling catheter Weight:  201 Height:     BEHAVIORAL SYMPTOMS/MOOD NEUROLOGICAL BOWEL NUTRITION STATUS      Colostomy Diet(no salt added)  AMBULATORY STATUS COMMUNICATION OF NEEDS Skin   Limited Assist(uses a cane and has unsteady gait) Verbally Normal                       Personal Care Assistance Level of Assistance  Bathing, Feeding, Dressing Bathing Assistance: Limited assistance Feeding assistance: Independent Dressing Assistance: Limited assistance     Functional Limitations Info  Sight, Hearing, Speech Sight Info: Adequate Hearing Info: Adequate Speech Info: Adequate    SPECIAL CARE FACTORS FREQUENCY                       Contractures Contractures Info: Not present    Additional Factors Info                  Current Medications (04/25/2017):   Current Outpatient Medications  Medication Sig Dispense Refill  . acetaminophen (TYLENOL) 500 MG tablet Take 1,000 mg by mouth every 6 (six) hours as needed (for pain/fever/headaches.).     Marland Kitchen allopurinol (ZYLOPRIM) 300 MG tablet take 1 tablet by mouth once daily 90 tablet 0  . apixaban (ELIQUIS) 5 MG TABS tablet Take 1 tablet (5 mg total) by mouth 2 (two) times daily. 60 tablet 11  . Ascorbic Acid (VITAMIN C) 1000 MG tablet Take 2,000 mg by mouth 2 (two) times daily.    Marland Kitchen buPROPion (WELLBUTRIN) 100 MG tablet take 1 tablet by mouth twice a day 120 tablet 0  .  carvedilol (COREG) 25 MG tablet take 1 tablets by mouth twice a day 180 tablet 3  . colchicine 0.6 MG tablet Take 0.6 mg by mouth 2 (two) times daily as needed.    . ferrous sulfate 325 (65 FE) MG tablet Take 1 tablet (325 mg total) by mouth 2 (two) times daily with a meal. 180 tablet 0  . finasteride (PROSCAR) 5 MG tablet take 1 tablet by mouth once daily 90 tablet 1  . losartan (COZAAR) 50 MG tablet Take 1 tablet (50 mg total) by mouth daily. 90 tablet 3  . lovastatin (MEVACOR) 20 MG tablet Take 1 tablet (20 mg total) by mouth at bedtime. 90  tablet 1  . methocarbamol (ROBAXIN) 750 MG tablet Take 1 tablet (750 mg total) by mouth 4 (four) times daily as needed (use for muscle cramps/pain). (Patient not taking: Reported on 03/29/2017) 30 tablet 2  . mirtazapine (REMERON) 30 MG tablet Take 1 tablet (30 mg total) by mouth at bedtime. 30 tablet 1  . Multiple Vitamins-Minerals (MULTIVITAMIN WITH MINERALS) tablet Take 1 tablet by mouth daily. Men's 50+ Multivitamin    . nitroGLYCERIN (NITROSTAT) 0.4 MG SL tablet Place 1 tablet (0.4 mg total) under the tongue every 5 (five) minutes as needed for chest pain. 25 tablet 3  . Omega-3 1000 MG CAPS Take 2 capsules (2,000 mg total) by mouth 2 (two) times daily. (Patient not taking: Reported on 03/29/2017) 180 capsule 1  . oxyCODONE-acetaminophen (PERCOCET/ROXICET) 5-325 MG tablet Take 1 tablet by mouth every 6 (six) hours as needed for severe pain.    . potassium chloride SA (K-DUR,KLOR-CON) 20 MEQ tablet Take 60 mEq by mouth every morning. And 20 meq by mouth in the evening    . tamsulosin (FLOMAX) 0.4 MG CAPS capsule Take 0.4 mg by mouth daily.     Marland Kitchen torsemide (DEMADEX) 20 MG tablet Take 20 mg by mouth daily after supper. TAKE 3 TABLETS (60MG ) IN THE AFTERNOON    . traZODone (DESYREL) 100 MG tablet Take 3 tablets (300 mg total) by mouth at bedtime. 90 tablet 2   No current facility-administered medications for this visit.      Discharge Medications:   Relevant Imaging Results:  Relevant Lab Results:   Additional Information SS # [id number]   Maurine Cane, LCSW

## 2017-04-26 ENCOUNTER — Other Ambulatory Visit: Payer: Self-pay

## 2017-04-27 NOTE — Telephone Encounter (Signed)
FL-2 form completed and faxed to Venia Minks, [place] at [phone].  ROI placed in scan box to be placed in EMR.    Burna Forts, BSN, RN-BC

## 2017-04-28 ENCOUNTER — Ambulatory Visit: Payer: Medicare Other | Admitting: Physical Therapy

## 2017-04-28 ENCOUNTER — Encounter: Payer: Self-pay | Admitting: Physical Therapy

## 2017-04-28 DIAGNOSIS — H8111 Benign paroxysmal vertigo, right ear: Secondary | ICD-10-CM

## 2017-04-28 DIAGNOSIS — M6281 Muscle weakness (generalized): Secondary | ICD-10-CM | POA: Diagnosis not present

## 2017-04-28 DIAGNOSIS — R42 Dizziness and giddiness: Secondary | ICD-10-CM

## 2017-04-28 DIAGNOSIS — R262 Difficulty in walking, not elsewhere classified: Secondary | ICD-10-CM | POA: Diagnosis not present

## 2017-04-28 NOTE — Therapy (Signed)
[facility] [address] [place], [place], [address] Phone: [phone]   Fax:  [phone]  Physical Therapy Treatment  Patient Details  Name: Patrick [name] MRN: [id number] Date of Birth: 1951/05/17 Referring Provider: Hyman Bible MD   Encounter Date: 04/28/2017  PT End of Session - 04/28/17 1459    Visit Number  4    Number of Visits  9    Date for PT Re-Evaluation  06/03/17    Authorization Type  [employer]HC Medicare; [facility]    Authorization Time Period  04/04/17 to 06/03/17    PT Start Time  1446    PT Stop Time  1524    PT Time Calculation (min)  38 min    Equipment Utilized During Treatment  --    Activity Tolerance  Patient tolerated treatment well    Behavior During Therapy  [facility] for tasks assessed/performed       Past Medical History:  Diagnosis Date  . Arthritis    "right hand; right knee" (06/19/2014)  . CAD (coronary artery disease)    2v CAD with subtotal occlusion of small co-dominant RCA and borderline lesion in moderate-sized OM-1  . CHF (congestive heart failure) (HCC)    Preserved EF  . Coronary artery disease involving native coronary artery of native heart with angina pectoris ([place]) 09/11/2016  . Degenerative joint disease of knee, right   . Dysrhythmia   . Enlarged prostate   . Hyperlipidemia   . Hypertension   . Pneumonia 05/2014  . Prediabetes 10/03/2014  . Scrotal edema 04/03/2015  . SMALL BOWEL OBSTRUCTION, HX OF 08/21/2007   Annotation: with narrowing in the ileocecal region Qualifier: Diagnosis of  By: Hassell Done FNP, Tori Milks      Past Surgical History:  Procedure Laterality Date  . CARDIAC CATHETERIZATION N/A 02/16/2016   Procedure: Right/Left Heart Cath and Coronary Angiography;  Surgeon: Jolaine Artist, MD;  Location: [facility] CV LAB;  Service: Cardiovascular;  Laterality: N/A;  . COLONOSCOPY N/A 04/01/2016   Procedure: COLONOSCOPY;  Surgeon: Leighton Ruff, MD;  Location: [facility] ENDOSCOPY;   Service: Endoscopy;  Laterality: N/A;  . INGUINAL HERNIA REPAIR Left 1990's  . IR GENERIC HISTORICAL  07/05/2016   IR RADIOLOGIST EVAL & MGMT 07/05/2016 Corrie Mckusick, DO [facility] INTERV RAD  . IR RADIOLOGIST EVAL & MGMT  03/29/2017  . XI ROBOT ABDOMINAL PERINEAL RESECTION N/A 09/28/2016   Procedure: XI ROBOT ABDOMINOPERINEAL RESECTION WITH PERMANENT COLOSTOMY ERAS PATHWAY;  Surgeon: Michael Boston, MD;  Location: [facility] ORS;  Service: General;  Laterality: N/A;    There were no vitals filed for this visit.  Subjective Assessment - 04/28/17 1450    Subjective  Pt reports looking into skilled nursing facility for long term stay for his sister and for ALF for himself. Pt reports dizziness when first sitting up in the morning, also when sitting down to get in car, not constant but every so often. When worst pt reports feeling "odd, spinning" for period of time of 30 secs. Pt reports an episode of dizziness this morning when sitting up in bed for "about 10 seconds".     Patient Stated Goals  Feel stronger and better balance    Currently in Pain?  No/denies             Vestibular Assessment - 04/28/17 1531      Symptom Behavior   Type of Dizziness  Spinning    Frequency of Dizziness  daily    Duration of  Dizziness  a few seconds    Aggravating Factors  Supine to sit;Sit to stand;Forward bending      Dix-Hallpike Right   Dix-Hallpike Right Duration  3 seconds    Dix-Hallpike Right Symptoms  Upbeat, right rotatory nystagmus      Dix-Hallpike Left   Dix-Hallpike Left Duration  0    Dix-Hallpike Left Symptoms  No nystagmus               Vestibular Treatment/Exercise - 04/28/17 1533      Vestibular Treatment/Exercise   Vestibular Treatment Provided  Canalith Repositioning;Habituation    Canalith Repositioning  Epley Manuever Right    Habituation Exercises  Laruth Bouchard Daroff       EPLEY MANUEVER RIGHT   Number of Reps   2    Overall Response  Symptoms Resolved    Response Details    Initial Epley +spinning; 2nd had no symptoms      Nestor Lewandowsky   Symptom Description   demonstrated for the pt and provided handout with instructions to use if spinning persists (he is unable to return for anymore therapy at this time)            PT Education - 04/28/17 1536    Education provided  Yes    Education Details  anatomy of inner ear and mechanism of BPPV; Brandt-Daroff exercise for home; can place patient on hold for up to 3 months and he can return (plans to cancel all appts), after that will need a new order from his MD    Person(s) Educated  Patient    Methods  Explanation;Handout    Comprehension  Verbalized understanding          PT Long Term Goals - 04/28/17 1549      PT LONG TERM GOAL #1   Title  Pt with negative positional testing for BPPV (Target for all LTGs 05/11/17)    Time  4    Period  Weeks    Status  Achieved      PT LONG TERM GOAL #2   Title  Patient will be independent with HEP for balance and strengthening    Time  4    Period  Weeks    Status  New      PT LONG TERM GOAL #3   Title  Pateint will improve gait velocity to >=2.20 ft/sec (indicative of lesser fall risk and approaching safe community gait velocity)    Time  4    Period  Weeks    Status  New      PT LONG TERM GOAL #4   Title  Complete Berg or FGA and set appropriate 4 week goal.    Time  1    Period  Weeks    Status  Achieved      PT LONG TERM GOAL #5   Title  Pt will improve BERG score to >/=45/56 to decr. falls risk.     Status  New      PT LONG TERM GOAL #6   Title  Pt will improve DGI score to >/=15/24 to decr. falls risk.     Baseline  FGA deferred due to impaired balance, DGI performed instead.     Status  New            Plan - 04/28/17 1540    Clinical Impression Statement  Session focused on addressing BPPV. Patient was asymptomatic with rt and lt Epley prior to leaving today. Pt declined  learning HEP for balance as "everything in my world is turned  upside down. I wouldn't find time to do them." Provided with Brandt-Daroff exercises he can use for HEP for BPPV if spinning persists. Will place patient on hold per his request.     Rehab Potential  Good    PT Frequency  2x / week    PT Duration  4 weeks    PT Treatment/Interventions  ADLs/Self Care Home Management;Canalith Repostioning;DME Instruction;Balance training;Therapeutic exercise;Therapeutic activities;Functional mobility training;Gait training;Neuromuscular re-education;Patient/family education;Vestibular;Visual/perceptual remediation/compensation    PT Next Visit Plan  on hold; discharge if does not return    Consulted and Agree with Plan of Care  Patient       Patient will benefit from skilled therapeutic intervention in order to improve the following deficits and impairments:  Decreased activity tolerance, Decreased balance, Decreased knowledge of use of DME, Decreased mobility, Difficulty walking, Decreased strength, Dizziness, Postural dysfunction, Other (comment)(vertigo)  Visit Diagnosis: Dizziness and giddiness  BPPV (benign paroxysmal positional vertigo), right     Problem List Patient Active Problem List   Diagnosis Date Noted  . Balance problem 03/07/2017  . Protein-calorie malnutrition, severe ([place]) 12/20/2016  . Pulmonary HTN ([place]) 11/10/2016  . History of resection of rectum 09/28/2016  . Rectal cancer ([place]) 09/28/2016  . Atrial fibrillation ([place]) 09/11/2016  . Coronary artery disease involving native coronary artery of native heart with angina pectoris ([place]) 09/11/2016  . Nonischemic cardiomyopathy ([place]) 09/11/2016  . Thrombocytopenia ([place]) 05/29/2016  . Adenocarcinoma of rectum s/p robotic APR/colostomy 09/28/2016 04/14/2016  . Chronic kidney disease (CKD), stage III (moderate) ([place]) 02/05/2016  . Abnormal finding on cardiovascular stress test 02/05/2016  . Cardiomyopathy, ischemic 06/17/2015  . Chronic systolic heart failure ([place]) 06/17/2015  . Major  depressive disorder, recurrent episode ([place]) 04/17/2015  . Other iron deficiency anemias   . Health care maintenance 03/11/2014  . Obesity, unspecified 10/03/2013  . Dyslipidemia 08/13/2013  . MACULAR DEGENERATION 05/02/2008  . ERECTILE DYSFUNCTION 03/13/2007  . Essential hypertension 01/16/2007  . BPH (benign prostatic hyperplasia) 01/16/2007    Rexanne Mano, PT 04/28/2017, 3:52 PM  [facility] [address] [place], [place], [address] Phone: [phone]   Fax:  [phone]  Name: Patrick [name] MRN: [id number] Date of Birth: 1951-04-25

## 2017-04-28 NOTE — Patient Instructions (Signed)
   Patient given Brandt-Daroff exercises

## 2017-05-02 ENCOUNTER — Encounter: Payer: Self-pay | Admitting: Physical Therapy

## 2017-05-05 ENCOUNTER — Encounter: Payer: Self-pay | Admitting: Physical Therapy

## 2017-05-06 ENCOUNTER — Other Ambulatory Visit: Payer: Self-pay | Admitting: Internal Medicine

## 2017-05-08 ENCOUNTER — Encounter: Payer: Self-pay | Admitting: Physical Therapy

## 2017-05-08 ENCOUNTER — Other Ambulatory Visit: Payer: Self-pay

## 2017-05-08 NOTE — Patient Outreach (Signed)
[facility]) Care Management  05/08/17  EGYPT MARCHIANO Dec 28, 1950 [id number]  Successful outreach completed with patient. Patient identification verified.  Trying to get in touch with lady at [facility] - can take both at same facility Playing phone tag; Freda Munro - [phone] Last time talked to her? Left several message but she has not returned call; they sleep late and cannot get awake to answer the phone; playing phone tag. One thing they need to do is order a bariatric bed for her -   Unfortunately becaue donna had an unpaid balance, would have to pay first before can go.  [place] had long term medicare and did not know it. I Gary't have it. Weather permitting, would like to get [place] moved this week.    Droke went back to sleep while on the phone.   Have had 3 people involved Casimer Lanius at PCP office Ms. Long at [facility]  The only other place that will take [place] is in [facility], but they would like to stay local because he has several specialists   Not ready to move; we will unfortunately have to leave a lot of stuff behind.  Also has the cat - having to give her away; we have had her since she was a kitten and she is 67 years old; this is almost more than [place] can handle; but does have a lady at the vet that has promised sh ewill find a good home for her, possibly her parents.

## 2017-05-08 NOTE — Patient Outreach (Signed)
[facility]) Care Management  Late entry for 04/26/2017  YAMAN GRAUBERGER 17-Aug-1950 [id number]  Attempted to reach patient without success. Left HIPAA compliant voicemail with RNCM contact information and invited to call back.  Per review of chart, Casimer Lanius, LCSW with PCP office is working with Henrene Pastor [phone] at [facility]  to place patient for long term care.  [place] R. Terrill Alperin, RN, BSN, [place] Management Coordinator [phone]

## 2017-05-09 ENCOUNTER — Encounter: Payer: Self-pay | Admitting: Licensed Clinical Social Worker

## 2017-05-09 NOTE — NC FL2 (Signed)
[place] LEVEL OF CARE SCREENING TOOL     IDENTIFICATION  Patient Name: Patrick [name] Birthdate: [birth year] Sex: male Admission Date (Current Location):   [place] Number:  [name] [phone] Q Facility and Address:         Provider Number: [id number]  Attending Physician Name and Address:  Dr. Hyman Bible  [address], [place] Relative Name and Phone Number:  Merlin Golden    Current Level of Care: Home Recommended Level of Care: [place] Prior Approval Number:    Date Approved/Denied:   PASRR Number: [id number]  Discharge Plan: SNF    Current Diagnoses: Patient Active Problem List   Diagnosis Date Noted  . Balance problem 03/07/2017  . Protein-calorie malnutrition, severe ([place]) 12/20/2016  . Pulmonary HTN ([place]) 11/10/2016  . History of resection of rectum 09/28/2016  . Rectal cancer ([place]) 09/28/2016  . Atrial fibrillation ([place]) 09/11/2016  . Coronary artery disease involving native coronary artery of native heart with angina pectoris ([place]) 09/11/2016  . Nonischemic cardiomyopathy ([place]) 09/11/2016  . Thrombocytopenia ([place]) 05/29/2016  . Adenocarcinoma of rectum s/p robotic APR/colostomy 09/28/2016 04/14/2016  . Chronic kidney disease (CKD), stage III (moderate) ([place]) 02/05/2016  . Abnormal finding on cardiovascular stress test 02/05/2016  . Cardiomyopathy, ischemic 06/17/2015  . Chronic systolic heart failure ([place]) 06/17/2015  . Major depressive disorder, recurrent episode ([place]) 04/17/2015  . Other iron deficiency anemias   . Health care maintenance 03/11/2014  . Obesity, unspecified 10/03/2013  . Dyslipidemia 08/13/2013  . MACULAR DEGENERATION 05/02/2008  . ERECTILE DYSFUNCTION 03/13/2007  . Essential hypertension 01/16/2007  . BPH (benign prostatic hyperplasia) 01/16/2007    Orientation RESPIRATION BLADDER Height & Weight     Self, Time, Situation, Place  Normal Indwelling catheter Weight:   195LB Height:     BEHAVIORAL SYMPTOMS/MOOD NEUROLOGICAL BOWEL NUTRITION STATUS      Colostomy Diet(heart healthy)  AMBULATORY STATUS COMMUNICATION OF NEEDS Skin   Limited Assist(uses a cane and has unsteady gait)) Verbally Normal                       Personal Care Assistance Level of Assistance  Bathing, Feeding, Dressing Bathing Assistance: Limited assistance Feeding assistance: Independent Dressing Assistance: Limited assistance     Functional Limitations Info  Sight, Hearing, Speech Sight Info: Adequate Hearing Info: Adequate Speech Info: Adequate    SPECIAL CARE FACTORS FREQUENCY  PT (By licensed PT)     PT Frequency: 3 to 5 time wk               Contractures Contractures Info: Not present    Additional Factors Info  Allergies   Allergies Info: Nsaids            Current Medications (05/09/2017):   Current Outpatient Medications  Medication Sig Dispense Refill  . acetaminophen (TYLENOL) 500 MG tablet Take 1,000 mg by mouth every 6 (six) hours as needed (for pain/fever/headaches.).     Marland Kitchen allopurinol (ZYLOPRIM) 300 MG tablet take 1 tablet by mouth once daily 90 tablet 0  . apixaban (ELIQUIS) 5 MG TABS tablet Take 1 tablet (5 mg total) by mouth 2 (two) times daily. 60 tablet 11  . Ascorbic Acid (VITAMIN C) 1000 MG tablet Take 2,000 mg by mouth 2 (two) times daily.    Marland Kitchen buPROPion (WELLBUTRIN) 100 MG tablet take 1 tablet by mouth twice a day 120 tablet 0  . carvedilol (COREG) 25 MG tablet  take 1 tablets by mouth twice a day 180 tablet 3  . colchicine 0.6 MG tablet Take 0.6 mg by mouth 2 (two) times daily as needed.    . ferrous sulfate 325 (65 FE) MG tablet Take 1 tablet (325 mg total) by mouth 2 (two) times daily with a meal. 180 tablet 0  . finasteride (PROSCAR) 5 MG tablet take 1 tablet by mouth once daily 90 tablet 1  . losartan (COZAAR) 50 MG tablet Take 1 tablet (50 mg total) by mouth daily. 90 tablet 3  . lovastatin (MEVACOR) 20 MG tablet Take 1  tablet (20 mg total) by mouth at bedtime. 90 tablet 1  . methocarbamol (ROBAXIN) 750 MG tablet Take 1 tablet (750 mg total) by mouth 4 (four) times daily as needed (use for muscle cramps/pain). (Patient not taking: Reported on 03/29/2017) 30 tablet 2  . mirtazapine (REMERON) 30 MG tablet Take 1 tablet (30 mg total) by mouth at bedtime. 30 tablet 1  . Multiple Vitamins-Minerals (MULTIVITAMIN WITH MINERALS) tablet Take 1 tablet by mouth daily. Men's 50+ Multivitamin    . nitroGLYCERIN (NITROSTAT) 0.4 MG SL tablet Place 1 tablet (0.4 mg total) under the tongue every 5 (five) minutes as needed for chest pain. 25 tablet 3  . Omega-3 1000 MG CAPS Take 2 capsules (2,000 mg total) by mouth 2 (two) times daily. (Patient not taking: Reported on 03/29/2017) 180 capsule 1  . oxyCODONE-acetaminophen (PERCOCET/ROXICET) 5-325 MG tablet Take 1 tablet by mouth every 6 (six) hours as needed for severe pain.    . potassium chloride SA (K-DUR,KLOR-CON) 20 MEQ tablet Take 60 mEq by mouth every morning. And 20 meq by mouth in the evening    . tamsulosin (FLOMAX) 0.4 MG CAPS capsule Take 0.4 mg by mouth daily.     Marland Kitchen torsemide (DEMADEX) 20 MG tablet Take 20 mg by mouth daily after supper. TAKE 3 TABLETS (60MG ) IN THE AFTERNOON    . traZODone (DESYREL) 100 MG tablet Take 3 tablets (300 mg total) by mouth at bedtime. 90 tablet 2   No current facility-administered medications for this visit.      Discharge Medications:   Relevant Imaging Results:  Relevant Lab Results:   Additional Information SS# [id number]  Maurine Cane, LCSW

## 2017-05-11 ENCOUNTER — Encounter: Payer: Self-pay | Admitting: Physical Therapy

## 2017-05-18 ENCOUNTER — Telehealth: Payer: Self-pay | Admitting: Licensed Clinical Social Worker

## 2017-05-18 NOTE — [place] Notes (Signed)
Type of Service: Clinical Social Work  LCSW returned phone call from patient, states he has spoken to [place] social worker and Freda Munro at [facility] to review process for his LTC placement. Patient has to be out of his house before the Jan. 1st.  Patient has spoken to his sister who is adjusting well to her placement at [facility] and waiting for his to arrive.  Patient states he knows this is the best thing for them both however it has been hard. Patient appreciative of assistance.     Intervention: Emotional support, Reflective listening, Supportive Counseling ,   Plan: Patient will go to [facility] on Monday Dec. 31st .  DSS worker Ms. Long will F/U with patient the end of next week to make sure he has everything in place for his transition.  Casimer Lanius, LCSW Licensed Clinical Social Worker [facility]   [phone] 9:22 AM

## 2017-05-23 ENCOUNTER — Encounter (HOSPITAL_COMMUNITY): Payer: Self-pay | Admitting: Emergency Medicine

## 2017-05-23 ENCOUNTER — Other Ambulatory Visit: Payer: Self-pay

## 2017-05-23 ENCOUNTER — Emergency Department (HOSPITAL_COMMUNITY): Payer: Medicare Other

## 2017-05-23 ENCOUNTER — Inpatient Hospital Stay (HOSPITAL_COMMUNITY)
Admission: EM | Admit: 2017-05-23 | Discharge: 2017-05-26 | DRG: 193 | Disposition: A | Discharge status: Home / Self Care | Payer: Medicare Other | Attending: Family Medicine | Admitting: Family Medicine

## 2017-05-23 DIAGNOSIS — I482 Chronic atrial fibrillation: Secondary | ICD-10-CM | POA: Diagnosis present

## 2017-05-23 DIAGNOSIS — Z8261 Family history of arthritis: Secondary | ICD-10-CM

## 2017-05-23 DIAGNOSIS — I5023 Acute on chronic systolic (congestive) heart failure: Secondary | ICD-10-CM | POA: Diagnosis present

## 2017-05-23 DIAGNOSIS — J189 Pneumonia, unspecified organism: Secondary | ICD-10-CM | POA: Diagnosis present

## 2017-05-23 DIAGNOSIS — I2510 Atherosclerotic heart disease of native coronary artery without angina pectoris: Secondary | ICD-10-CM | POA: Diagnosis present

## 2017-05-23 DIAGNOSIS — Z833 Family history of diabetes mellitus: Secondary | ICD-10-CM

## 2017-05-23 DIAGNOSIS — N183 Chronic kidney disease, stage 3 unspecified: Secondary | ICD-10-CM | POA: Diagnosis present

## 2017-05-23 DIAGNOSIS — M1711 Unilateral primary osteoarthritis, right knee: Secondary | ICD-10-CM | POA: Diagnosis present

## 2017-05-23 DIAGNOSIS — N400 Enlarged prostate without lower urinary tract symptoms: Secondary | ICD-10-CM | POA: Diagnosis present

## 2017-05-23 DIAGNOSIS — H3530 Unspecified macular degeneration: Secondary | ICD-10-CM | POA: Diagnosis present

## 2017-05-23 DIAGNOSIS — Z87891 Personal history of nicotine dependence: Secondary | ICD-10-CM

## 2017-05-23 DIAGNOSIS — F339 Major depressive disorder, recurrent, unspecified: Secondary | ICD-10-CM | POA: Diagnosis present

## 2017-05-23 DIAGNOSIS — J159 Unspecified bacterial pneumonia: Secondary | ICD-10-CM | POA: Diagnosis present

## 2017-05-23 DIAGNOSIS — Z7901 Long term (current) use of anticoagulants: Secondary | ICD-10-CM

## 2017-05-23 DIAGNOSIS — I272 Pulmonary hypertension, unspecified: Secondary | ICD-10-CM | POA: Diagnosis present

## 2017-05-23 DIAGNOSIS — I10 Essential (primary) hypertension: Secondary | ICD-10-CM | POA: Diagnosis present

## 2017-05-23 DIAGNOSIS — R748 Abnormal levels of other serum enzymes: Secondary | ICD-10-CM | POA: Diagnosis present

## 2017-05-23 DIAGNOSIS — R0602 Shortness of breath: Secondary | ICD-10-CM | POA: Diagnosis not present

## 2017-05-23 DIAGNOSIS — Z888 Allergy status to other drugs, medicaments and biological substances status: Secondary | ICD-10-CM

## 2017-05-23 DIAGNOSIS — I130 Hypertensive heart and chronic kidney disease with heart failure and stage 1 through stage 4 chronic kidney disease, or unspecified chronic kidney disease: Secondary | ICD-10-CM | POA: Diagnosis present

## 2017-05-23 DIAGNOSIS — J181 Lobar pneumonia, unspecified organism: Secondary | ICD-10-CM

## 2017-05-23 DIAGNOSIS — J180 Bronchopneumonia, unspecified organism: Principal | ICD-10-CM | POA: Diagnosis present

## 2017-05-23 DIAGNOSIS — N179 Acute kidney failure, unspecified: Secondary | ICD-10-CM | POA: Diagnosis not present

## 2017-05-23 DIAGNOSIS — Z9049 Acquired absence of other specified parts of digestive tract: Secondary | ICD-10-CM

## 2017-05-23 DIAGNOSIS — R05 Cough: Secondary | ICD-10-CM | POA: Diagnosis not present

## 2017-05-23 DIAGNOSIS — I4891 Unspecified atrial fibrillation: Secondary | ICD-10-CM | POA: Diagnosis present

## 2017-05-23 DIAGNOSIS — Z8701 Personal history of pneumonia (recurrent): Secondary | ICD-10-CM

## 2017-05-23 DIAGNOSIS — R7303 Prediabetes: Secondary | ICD-10-CM | POA: Diagnosis present

## 2017-05-23 DIAGNOSIS — T465X5A Adverse effect of other antihypertensive drugs, initial encounter: Secondary | ICD-10-CM | POA: Diagnosis not present

## 2017-05-23 DIAGNOSIS — I255 Ischemic cardiomyopathy: Secondary | ICD-10-CM | POA: Diagnosis present

## 2017-05-23 DIAGNOSIS — Z79899 Other long term (current) drug therapy: Secondary | ICD-10-CM

## 2017-05-23 DIAGNOSIS — M19041 Primary osteoarthritis, right hand: Secondary | ICD-10-CM | POA: Diagnosis present

## 2017-05-23 DIAGNOSIS — Z8249 Family history of ischemic heart disease and other diseases of the circulatory system: Secondary | ICD-10-CM

## 2017-05-23 DIAGNOSIS — R7989 Other specified abnormal findings of blood chemistry: Secondary | ICD-10-CM

## 2017-05-23 DIAGNOSIS — Z85048 Personal history of other malignant neoplasm of rectum, rectosigmoid junction, and anus: Secondary | ICD-10-CM

## 2017-05-23 DIAGNOSIS — N141 Nephropathy induced by other drugs, medicaments and biological substances: Secondary | ICD-10-CM | POA: Diagnosis not present

## 2017-05-23 DIAGNOSIS — I428 Other cardiomyopathies: Secondary | ICD-10-CM

## 2017-05-23 DIAGNOSIS — I4510 Unspecified right bundle-branch block: Secondary | ICD-10-CM | POA: Diagnosis present

## 2017-05-23 DIAGNOSIS — R778 Other specified abnormalities of plasma proteins: Secondary | ICD-10-CM | POA: Diagnosis present

## 2017-05-23 DIAGNOSIS — Z933 Colostomy status: Secondary | ICD-10-CM

## 2017-05-23 DIAGNOSIS — K7460 Unspecified cirrhosis of liver: Secondary | ICD-10-CM | POA: Diagnosis present

## 2017-05-23 DIAGNOSIS — I5043 Acute on chronic combined systolic (congestive) and diastolic (congestive) heart failure: Secondary | ICD-10-CM | POA: Diagnosis present

## 2017-05-23 DIAGNOSIS — D696 Thrombocytopenia, unspecified: Secondary | ICD-10-CM | POA: Diagnosis present

## 2017-05-23 DIAGNOSIS — C642 Malignant neoplasm of left kidney, except renal pelvis: Secondary | ICD-10-CM | POA: Diagnosis present

## 2017-05-23 DIAGNOSIS — E876 Hypokalemia: Secondary | ICD-10-CM | POA: Diagnosis not present

## 2017-05-23 DIAGNOSIS — Z8042 Family history of malignant neoplasm of prostate: Secondary | ICD-10-CM

## 2017-05-23 DIAGNOSIS — E785 Hyperlipidemia, unspecified: Secondary | ICD-10-CM | POA: Diagnosis present

## 2017-05-23 DIAGNOSIS — E871 Hypo-osmolality and hyponatremia: Secondary | ICD-10-CM | POA: Diagnosis present

## 2017-05-23 LAB — COMPREHENSIVE METABOLIC PANEL
ALT: 10 U/L — AB (ref 17–63)
ANION GAP: 10 (ref 5–15)
AST: 17 U/L (ref 15–41)
Albumin: 3.5 g/dL (ref 3.5–5.0)
Alkaline Phosphatase: 118 U/L (ref 38–126)
BUN: 36 mg/dL — ABNORMAL HIGH (ref 6–20)
CHLORIDE: 99 mmol/L — AB (ref 101–111)
CO2: 33 mmol/L — ABNORMAL HIGH (ref 22–32)
CREATININE: 1.42 mg/dL — AB (ref 0.61–1.24)
Calcium: 8.8 mg/dL — ABNORMAL LOW (ref 8.9–10.3)
GFR, EST AFRICAN AMERICAN: 57 mL/min — AB (ref >60)
GFR, EST NON AFRICAN AMERICAN: 49 mL/min — AB (ref >60)
Glucose, Bld: 113 mg/dL — ABNORMAL HIGH (ref 65–99)
POTASSIUM: 2.7 mmol/L — AB (ref 3.5–5.1)
Sodium: 142 mmol/L (ref 135–145)
Total Bilirubin: 1.5 mg/dL — ABNORMAL HIGH (ref 0.3–1.2)
Total Protein: 7.4 g/dL (ref 6.5–8.1)

## 2017-05-23 LAB — CBC WITH DIFFERENTIAL/PLATELET
BASOS PCT: 0 %
Basophils Absolute: 0 10*3/uL (ref 0.0–0.1)
EOS ABS: 0.1 10*3/uL (ref 0.0–0.7)
Eosinophils Relative: 1 %
HCT: 37.6 % — ABNORMAL LOW (ref 39.0–52.0)
HEMOGLOBIN: 11.4 g/dL — AB (ref 13.0–17.0)
LYMPHS ABS: 0.4 10*3/uL — AB (ref 0.7–4.0)
Lymphocytes Relative: 7 %
MCH: 24.9 pg — AB (ref 26.0–34.0)
MCHC: 30.3 g/dL (ref 30.0–36.0)
MCV: 82.3 fL (ref 78.0–100.0)
Monocytes Absolute: 0.6 10*3/uL (ref 0.1–1.0)
Monocytes Relative: 12 %
NEUTROS ABS: 4.4 10*3/uL (ref 1.7–7.7)
NEUTROS PCT: 80 %
Platelets: 111 10*3/uL — ABNORMAL LOW (ref 150–400)
RBC: 4.57 MIL/uL (ref 4.22–5.81)
RDW: 18.3 % — ABNORMAL HIGH (ref 11.5–15.5)
WBC: 5.5 10*3/uL (ref 4.0–10.5)

## 2017-05-23 LAB — I-STAT TROPONIN, ED: TROPONIN I, POC: 0.06 ng/mL (ref 0.00–0.08)

## 2017-05-23 LAB — TROPONIN I: TROPONIN I: 0.05 ng/mL — AB (ref <0.03)

## 2017-05-23 LAB — I-STAT CG4 LACTIC ACID, ED: LACTIC ACID, VENOUS: 0.91 mmol/L (ref 0.5–1.9)

## 2017-05-23 LAB — BRAIN NATRIURETIC PEPTIDE: B NATRIURETIC PEPTIDE 5: 487.5 pg/mL — AB (ref 0.0–100.0)

## 2017-05-23 LAB — MRSA PCR SCREENING: MRSA BY PCR: POSITIVE — AB

## 2017-05-23 LAB — MAGNESIUM: MAGNESIUM: 2 mg/dL (ref 1.7–2.4)

## 2017-05-23 LAB — STREP PNEUMONIAE URINARY ANTIGEN: STREP PNEUMO URINARY ANTIGEN: NEGATIVE

## 2017-05-23 MED ORDER — OXYCODONE-ACETAMINOPHEN 5-325 MG PO TABS
1.0000 | ORAL_TABLET | Freq: Four times a day (QID) | ORAL | Status: DC | PRN
  Administered 2017-05-24 – 2017-05-25 (×2): 1 via ORAL
  Filled 2017-05-23 (×2): qty 1

## 2017-05-23 MED ORDER — TORSEMIDE 20 MG PO TABS: 80.0000 mg | ORAL_TABLET | Freq: Every day | ORAL | Status: DC

## 2017-05-23 MED ORDER — BUPROPION HCL 100 MG PO TABS
100.0000 mg | ORAL_TABLET | Freq: Two times a day (BID) | ORAL | Status: DC
  Administered 2017-05-23 – 2017-05-26 (×6): 100 mg via ORAL
  Filled 2017-05-23 (×7): qty 1

## 2017-05-23 MED ORDER — ALLOPURINOL 300 MG PO TABS
300.0000 mg | ORAL_TABLET | Freq: Every day | ORAL | Status: DC
  Administered 2017-05-23 – 2017-05-26 (×4): 300 mg via ORAL
  Filled 2017-05-23 (×4): qty 1

## 2017-05-23 MED ORDER — PRAVASTATIN SODIUM 20 MG PO TABS
20.0000 mg | ORAL_TABLET | Freq: Every day | ORAL | Status: DC
  Administered 2017-05-23 – 2017-05-25 (×3): 20 mg via ORAL
  Filled 2017-05-23 (×4): qty 1

## 2017-05-23 MED ORDER — DEXTROSE 5 % IV SOLN
1.0000 g | INTRAVENOUS | Status: DC
  Administered 2017-05-24: 1 g via INTRAVENOUS
  Filled 2017-05-23 (×2): qty 10

## 2017-05-23 MED ORDER — FINASTERIDE 5 MG PO TABS
5.0000 mg | ORAL_TABLET | Freq: Every day | ORAL | Status: DC
  Administered 2017-05-24 – 2017-05-26 (×3): 5 mg via ORAL
  Filled 2017-05-23 (×4): qty 1

## 2017-05-23 MED ORDER — TORSEMIDE 20 MG PO TABS
60.0000 mg | ORAL_TABLET | Freq: Every day | ORAL | Status: DC
  Administered 2017-05-23 – 2017-05-24 (×2): 60 mg via ORAL
  Filled 2017-05-23 (×2): qty 3

## 2017-05-23 MED ORDER — POTASSIUM CHLORIDE CRYS ER 20 MEQ PO TBCR
40.0000 meq | EXTENDED_RELEASE_TABLET | Freq: Once | ORAL | Status: AC
  Administered 2017-05-23: 40 meq via ORAL
  Filled 2017-05-23: qty 2

## 2017-05-23 MED ORDER — TAMSULOSIN HCL 0.4 MG PO CAPS
0.4000 mg | ORAL_CAPSULE | Freq: Every day | ORAL | Status: DC
  Administered 2017-05-24 – 2017-05-26 (×3): 0.4 mg via ORAL
  Filled 2017-05-23 (×4): qty 1

## 2017-05-23 MED ORDER — POTASSIUM CHLORIDE 10 MEQ/100ML IV SOLN
10.0000 meq | INTRAVENOUS | Status: AC
  Administered 2017-05-23 (×2): 10 meq via INTRAVENOUS
  Filled 2017-05-23 (×3): qty 100

## 2017-05-23 MED ORDER — FERROUS SULFATE 325 (65 FE) MG PO TABS
325.0000 mg | ORAL_TABLET | Freq: Two times a day (BID) | ORAL | Status: DC
  Administered 2017-05-24 (×2): 325 mg via ORAL
  Filled 2017-05-23 (×3): qty 1

## 2017-05-23 MED ORDER — LOSARTAN POTASSIUM 50 MG PO TABS
50.0000 mg | ORAL_TABLET | Freq: Every day | ORAL | Status: DC
  Administered 2017-05-23 – 2017-05-25 (×3): 50 mg via ORAL
  Filled 2017-05-23 (×3): qty 1

## 2017-05-23 MED ORDER — AZITHROMYCIN 500 MG PO TABS
500.0000 mg | ORAL_TABLET | ORAL | Status: DC
  Administered 2017-05-24 – 2017-05-25 (×2): 500 mg via ORAL
  Filled 2017-05-23 (×2): qty 1

## 2017-05-23 MED ORDER — DEXTROSE 5 % IV SOLN
1.0000 g | Freq: Once | INTRAVENOUS | Status: AC
  Administered 2017-05-23: 1 g via INTRAVENOUS
  Filled 2017-05-23: qty 10

## 2017-05-23 MED ORDER — SODIUM CHLORIDE 0.9 % IV BOLUS (SEPSIS)
500.0000 mL | Freq: Once | INTRAVENOUS | Status: AC
  Administered 2017-05-23: 500 mL via INTRAVENOUS

## 2017-05-23 MED ORDER — AZITHROMYCIN 250 MG PO TABS
500.0000 mg | ORAL_TABLET | Freq: Once | ORAL | Status: AC
  Administered 2017-05-23: 500 mg via ORAL
  Filled 2017-05-23: qty 2

## 2017-05-23 MED ORDER — TRAZODONE HCL 150 MG PO TABS
300.0000 mg | ORAL_TABLET | Freq: Every day | ORAL | Status: DC
  Administered 2017-05-23 – 2017-05-25 (×3): 300 mg via ORAL
  Filled 2017-05-23 (×3): qty 2

## 2017-05-23 MED ORDER — APIXABAN 5 MG PO TABS
5.0000 mg | ORAL_TABLET | Freq: Two times a day (BID) | ORAL | Status: DC
  Administered 2017-05-23 – 2017-05-26 (×6): 5 mg via ORAL
  Filled 2017-05-23 (×6): qty 1

## 2017-05-23 MED ORDER — CARVEDILOL 25 MG PO TABS
25.0000 mg | ORAL_TABLET | Freq: Two times a day (BID) | ORAL | Status: DC
  Administered 2017-05-24 (×2): 25 mg via ORAL
  Filled 2017-05-23 (×3): qty 1

## 2017-05-23 NOTE — Plan of Care (Signed)
  Health Behavior/Discharge Planning: Ability to manage health-related needs will improve 05/23/2017 1750 - Not Progressing by Imagene Gurney, RN

## 2017-05-23 NOTE — [place] Notes (Addendum)
Received a call from ED provider for transfer of patient Patrick [name] to [facility] at 4 pm, placed temporary order. Patient stable for transfer, will await transfer, within 2 hour window.

## 2017-05-23 NOTE — H&P (Signed)
[facility] Admission History and Physical Service Pager: [phone]  Patient name: Patrick [name] Medical record number: [id number] Date of birth: [birth year] Age: 66 y.o. Gender: male  Primary Care Provider: Mayo, Pete Pelt, MD Consultants: None  Code Status: Full   Chief Complaint: SOB   Assessment and Plan: Patrick [name] is a 66 y.o. male presenting with SOB . PMH is significant for CAD, Enlarged prostate, HTN, HLD, hx of small bowel obstruction, Prediabetes, HFrEF, Osteoarthritis, Afib, Hx of Rectal cancer   SOB: Vitals have been stable, patient is on room air satting 98%.  Chest x-ray concerning for left lower lobe bronchopneumonia.  WBC within normal limits. Negative lactic acid.  Most concerning for pneumonia, however given patient's significant cardiac history and  ejection fraction of 30-35% Troponin 0.06. EKG with RBB.  Will want to do a full ACS workup. BNP 487.5, elevated but half that of the previous obtained 2 years ago  -Admit to telemetry, attending Dr. Wendy Poet telemetry -Will treat with ceftriaxone and azithromycin for CAP  -Will trend troponins x3 and obtain an a.m. EKG - Obtain Strep and Legionella urine antigen  - Blood cultures pending  -strict I's and O's -Continue home torsemide - Obtain repeat ECHO  - Consider call cardiology in the AM depending on patient improvement  -Follow-up in the a.m. on breathing status  Hypokalemia: Potassium 2.7.  K-Dur times 40 mEq in the ED - Start 3 runs of potassium IV   CKD: Scr 1.42 Baseline SCr 1.35. Close to baseline  - No concern about AKI  - Continue ARB for now   CAD: Cardiac Cath in 2017 2v CAD with subtotal occlusion of small co-dominant RCA and borderline lesion in moderate-sized OM-,  Severe, predominantly non-ischemic CM EF 20% - Trending troponin and AM EKG  - Continue Coreg   HFrEF: EF 30-35%,G2DD in  2017 - Continue home toresmide 60 mg in AM and  80 mg PM  - Coreg 25 mg BID  -  Losartan   AF: EKG with Afib, RBB; similar to previous back in June. CHA2DS2 - VASc score of 5 - Currently on Eliquis   HTN: Normotensive  - Continue losartan  Enlarged prostate:  - Home Proscar and Flomax    FEN/GI: Heart Healthy Diet  Prophylaxis: Lovenox   Disposition: Observation   History of Present Illness:  Patrick [name] is a 66 y.o. male presenting with SOB.  Patient states over the past 2 weeks he has had worsening shortness of breath.  Last night he states was worse than any of his previous nights.  He had to sit up in bed due to his shortness of breath.  He also states he has had a wet cough for the last couple of days.  He denies any sputum production but states he feels like there is sputum.  Denies any fevers, chills, nausea, vomiting.  He does indicate over the past 24 hours has had worsening leg swelling however he has also been sitting in his arm chair most of the day.  He denies any chest pain, palpitations.   Review Of Systems: Per HPI with the following additions: None   Review of Systems  Constitutional: Negative for chills and fever.  HENT: Negative for congestion and sore throat.   Eyes: Negative for blurred vision.  Respiratory: Positive for cough, sputum production and shortness of breath. Negative for wheezing.   Cardiovascular: Positive for leg swelling. Negative for chest pain and palpitations.  Gastrointestinal:  Negative for abdominal pain, nausea and vomiting.  Genitourinary: Negative for dysuria.  Neurological: Negative for dizziness and headaches.    Patient Active Problem List   Diagnosis Date Noted  . Pneumonia 05/23/2017  . Balance problem 03/07/2017  . Protein-calorie malnutrition, severe ([place]) 12/20/2016  . Pulmonary HTN ([place]) 11/10/2016  . History of resection of rectum 09/28/2016  . Rectal cancer ([place]) 09/28/2016  . Atrial fibrillation ([place]) 09/11/2016  . Coronary artery disease involving native coronary artery of native heart with angina  pectoris ([place]) 09/11/2016  . Nonischemic cardiomyopathy ([place]) 09/11/2016  . Thrombocytopenia ([place]) 05/29/2016  . Adenocarcinoma of rectum s/p robotic APR/colostomy 09/28/2016 04/14/2016  . Chronic kidney disease (CKD), stage III (moderate) ([place]) 02/05/2016  . Abnormal finding on cardiovascular stress test 02/05/2016  . Cardiomyopathy, ischemic 06/17/2015  . Chronic systolic heart failure ([place]) 06/17/2015  . Major depressive disorder, recurrent episode ([place]) 04/17/2015  . Other iron deficiency anemias   . Health care maintenance 03/11/2014  . Obesity, unspecified 10/03/2013  . Dyslipidemia 08/13/2013  . MACULAR DEGENERATION 05/02/2008  . ERECTILE DYSFUNCTION 03/13/2007  . Essential hypertension 01/16/2007  . BPH (benign prostatic hyperplasia) 01/16/2007    Past Medical History: Past Medical History:  Diagnosis Date  . Arthritis    "right hand; right knee" (06/19/2014)  . CAD (coronary artery disease)    2v CAD with subtotal occlusion of small co-dominant RCA and borderline lesion in moderate-sized OM-1  . CHF (congestive heart failure) (HCC)    Preserved EF  . Coronary artery disease involving native coronary artery of native heart with angina pectoris ([place]) 09/11/2016  . Degenerative joint disease of knee, right   . Dysrhythmia   . Enlarged prostate   . Hyperlipidemia   . Hypertension   . Pneumonia 05/2014  . Prediabetes 10/03/2014  . Scrotal edema 04/03/2015  . SMALL BOWEL OBSTRUCTION, HX OF 08/21/2007   Annotation: with narrowing in the ileocecal region Qualifier: Diagnosis of  By: Hassell Done FNP, Tori Milks      Past Surgical History: Past Surgical History:  Procedure Laterality Date  . CARDIAC CATHETERIZATION N/A 02/16/2016   Procedure: Right/Left Heart Cath and Coronary Angiography;  Surgeon: Jolaine Artist, MD;  Location: [facility];  Service: Cardiovascular;  Laterality: N/A;  . COLONOSCOPY N/A 04/01/2016   Procedure: COLONOSCOPY;  Surgeon: Leighton Ruff, MD;   Location: [facility];  Service: Endoscopy;  Laterality: N/A;  . INGUINAL HERNIA REPAIR Left 1990's  . IR GENERIC HISTORICAL  07/05/2016   IR RADIOLOGIST EVAL & MGMT 07/05/2016 Corrie Mckusick, DO [facility]  . IR RADIOLOGIST EVAL & MGMT  03/29/2017  . XI ROBOT ABDOMINAL PERINEAL RESECTION N/A 09/28/2016   Procedure: XI ROBOT ABDOMINOPERINEAL RESECTION WITH PERMANENT COLOSTOMY ERAS PATHWAY;  Surgeon: Michael Boston, MD;  Location: [facility];  Service: [name];  Laterality: N/A;    Social History: Social History   Tobacco Use  . Smoking status: Former Smoker    Years: 5.00    Types: Pipe, Landscape architect  . Smokeless tobacco: Never Used  . Tobacco comment: 06/19/2014 "stopped smoking in ~ 2014; used to smoke a pipe or cigar a couple times/month"  Substance Use Topics  . Alcohol use: No  . Drug use: No   Additional social history:  Please also refer to relevant sections of EMR.  Family History: Family History  Problem Relation Age of Onset  . Hypertension Mother   . Diabetes Mother   . Stroke Mother   . Cancer Father 34  Prostate  . Dementia Father   . COPD Sister   . Arthritis Sister   . Edema Sister    Allergies and Medications: Allergies  Allergen Reactions  . Nsaids     Kidney disease   No current facility-administered medications on file prior to encounter.    Current Outpatient Medications on File Prior to Encounter  Medication Sig Dispense Refill  . acetaminophen (TYLENOL) 500 MG tablet Take 1,000 mg by mouth every 6 (six) hours as needed (for pain/fever/headaches.).     Marland Kitchen allopurinol (ZYLOPRIM) 300 MG tablet take 1 tablet by mouth once daily 90 tablet 0  . apixaban (ELIQUIS) 5 MG TABS tablet Take 1 tablet (5 mg total) by mouth 2 (two) times daily. 60 tablet 11  . Ascorbic Acid (VITAMIN C) 1000 MG tablet Take 2,000 mg by mouth 2 (two) times daily.    Marland Kitchen buPROPion (WELLBUTRIN) 100 MG tablet take 1 tablet by mouth twice a day 120 tablet 0  . carvedilol (COREG) 25 MG tablet  take 1 tablets by mouth twice a day 180 tablet 3  . colchicine 0.6 MG tablet Take 0.6 mg by mouth 2 (two) times daily as needed.    . ferrous sulfate 325 (65 FE) MG tablet Take 1 tablet (325 mg total) by mouth 2 (two) times daily with a meal. 180 tablet 0  . finasteride (PROSCAR) 5 MG tablet take 1 tablet by mouth once daily 90 tablet 1  . losartan (COZAAR) 50 MG tablet Take 1 tablet (50 mg total) by mouth daily. 90 tablet 3  . lovastatin (MEVACOR) 20 MG tablet Take 1 tablet (20 mg total) by mouth at bedtime. 90 tablet 1  . Multiple Vitamins-Minerals (MULTIVITAMIN WITH MINERALS) tablet Take 1 tablet by mouth daily. Men's 50+ Multivitamin    . nitroGLYCERIN (NITROSTAT) 0.4 MG SL tablet place 1 tablet under the tongue if needed every 5 minutes for chest pain for 3 doses IF NO RELIEF AFTER 1ST DOSE CALL 911. 25 tablet 0  . Omega-3 1000 MG CAPS Take 2 capsules (2,000 mg total) by mouth 2 (two) times daily. 180 capsule 1  . oxyCODONE-acetaminophen (PERCOCET/ROXICET) 5-325 MG tablet Take 1 tablet by mouth every 6 (six) hours as needed for severe pain.    . potassium chloride SA (K-DUR,KLOR-CON) 20 MEQ tablet Take 60 mEq by mouth every morning. And 20 meq by mouth in the evening    . tamsulosin (FLOMAX) 0.4 MG CAPS capsule Take 0.4 mg by mouth daily.     Marland Kitchen torsemide (DEMADEX) 20 MG tablet Take 20 mg by mouth daily after supper. TAKE 3 TABLETS (60MG ) IN THE AFTERNOON    . traZODone (DESYREL) 100 MG tablet Take 3 tablets (300 mg total) by mouth at bedtime. 90 tablet 2  . methocarbamol (ROBAXIN) 750 MG tablet Take 1 tablet (750 mg total) by mouth 4 (four) times daily as needed (use for muscle cramps/pain). (Patient not taking: Reported on 03/29/2017) 30 tablet 2  . mirtazapine (REMERON) 30 MG tablet Take 1 tablet (30 mg total) by mouth at bedtime. (Patient not taking: Reported on 05/23/2017) 30 tablet 1    Objective: BP 125/89 (BP Location: Right Arm)   Pulse 74   Temp (!) 97.5 F (36.4 C) (Oral)   Resp  15   Ht 5\' 9"  (1.753 m)   Wt 198 lb 14.4 oz (90.2 kg)   SpO2 98%   BMI 29.37 kg/m  Exam: Physical Exam  Constitutional: He is oriented to person, place, and  time and well-developed, well-nourished, and in no distress.  HENT:  Head: Normocephalic and atraumatic.  Eyes: Conjunctivae are normal. Pupils are equal, round, and reactive to light.  Neck: Normal range of motion. Neck supple.  Cardiovascular: Normal rate.  Murmur noted   Pulmonary/Chest: Effort normal and breath sounds normal.  Abdominal: Soft. Bowel sounds are normal.  Colectomy bag with brown stool present   Musculoskeletal: Normal range of motion.  Neurological: He is alert and oriented to person, place, and time.  Skin: Skin is warm and dry.  Psychiatric: Affect and judgment normal.    Labs and Imaging: CBC BMET  Recent Labs  Lab 05/23/17 1332  WBC 5.5  HGB 11.4*  HCT 37.6*  PLT 111*   Recent Labs  Lab 05/23/17 1332  NA 142  K 2.7*  CL 99*  CO2 33*  BUN 36*  CREATININE 1.42*  GLUCOSE 113*  CALCIUM 8.8*     Dg Chest 2 View  Result Date: 05/23/2017 CLINICAL DATA:  66 year old male with shortness of breath and cough for the past 2 weeks EXAM: CHEST  2 VIEW COMPARISON:  Prior chest x-ray 10/08/2016 FINDINGS: Patchy left lower lobe airspace opacity with positive spine sign on the lateral view. Findings are concerning for left lower lobe pneumonia. Stable cardiomegaly. No pleural effusion or pneumothorax. No evidence of pulmonary edema. No acute osseous abnormality. IMPRESSION: Patchy left lower lobe airspace opacity concerning for bronchopneumonia. Followup PA and lateral chest X-ray is recommended in 3-4 weeks following trial of antibiotic therapy to ensure resolution and exclude underlying malignancy. Electronically Signed   By: Jacqulynn Cadet M.D.   On: 05/23/2017 13:56     Tonette Bihari, MD 05/23/2017, 7:30 PM PGY-3, [place] [facility] pager: [phone], text pages  welcome

## 2017-05-23 NOTE — ED Notes (Signed)
ED TO INPATIENT HANDOFF REPORT  Name/Age/Gender Patrick [name] 66 y.o. male  Code Status Code Status History    Date Active Date Inactive Code Status Order ID Comments User Context   10/06/2016 22:22 10/10/2016 16:18 Full Code [id number]  Norval Morton, MD ED   09/28/2016 17:30 10/02/2016 18:52 Full Code [id number]  Michael Boston, MD Inpatient   09/28/2016 17:14 09/28/2016 17:30 Full Code [id number]  Michael Boston, MD Inpatient   05/29/2016 23:13 06/02/2016 19:20 Full Code [id number]  Etta Quill, DO ED   02/16/2016 13:48 02/16/2016 22:34 Full Code [id number]  Jolaine Artist, MD Inpatient   04/03/2015 22:43 04/07/2015 19:54 Full Code [id number]  Mariel Aloe, MD Inpatient   06/18/2014 19:04 06/22/2014 15:59 Full Code [id number]  Katheren Shams, DO Inpatient   06/01/2014 22:25 06/03/2014 17:43 Full Code [id number]  Archie Patten, MD Inpatient   09/30/2012 13:43 10/02/2012 17:17 Full Code [id number]  Coral Spikes, DO Inpatient      Home/SNF/Other Home  Chief Complaint [facility]  Level of Care/Admitting Diagnosis ED Disposition    ED Disposition Condition [facility] Area: [place] [100100]  Level of Care: Telemetry [5]  Diagnosis: Pneumonia [629476]  Admitting Physician: Rickey Primus  Attending Physician: MCDIARMID, TODD D [[id number]  PT Class (Do Not Modify): Observation [104]  PT Acc Code (Do Not Modify): Observation [10022]       Medical History Past Medical History:  Diagnosis Date  . Arthritis    "right hand; right knee" (06/19/2014)  . CAD (coronary artery disease)    2v CAD with subtotal occlusion of small co-dominant RCA and borderline lesion in moderate-sized OM-1  . CHF (congestive heart failure) (HCC)    Preserved EF  . Coronary artery disease involving native coronary artery of native heart with angina pectoris ([place]) 09/11/2016  . Degenerative joint disease of knee, right   . Dysrhythmia   . Enlarged prostate   . Hyperlipidemia   .  Hypertension   . Pneumonia 05/2014  . Prediabetes 10/03/2014  . Scrotal edema 04/03/2015  . SMALL BOWEL OBSTRUCTION, HX OF 08/21/2007   Annotation: with narrowing in the ileocecal region Qualifier: Diagnosis of  By: Hassell Done FNP, [place]      Allergies Allergies  Allergen Reactions  . Nsaids     Kidney disease    IV Location/Drains/Wounds Patient Lines/Drains/Airways Status   Active Line/Drains/Airways    Name:   Placement date:   Placement time:   Site:   Days:   Peripheral IV 05/23/17 Right Forearm   05/23/17    1337    Forearm   less than 1   Colostomy LUQ   09/28/16    1300    LUQ   237   Urethral Catheter Etheleen Nicks, RN Coude 16 Fr.   10/07/16    0517    Coude   228   Incision (Closed) 09/28/16 Rectum Other (Comment)   09/28/16    1332     237   Incision (Closed) 09/28/16 Rectum   09/28/16    1335     237   Incision - 4 Ports Abdomen Right;Upper Right;Mid Right;Lateral Right   09/28/16    0930     237          Labs/Imaging Results for orders placed or performed during the hospital encounter of 05/23/17 (from the past 48 hour(s))  Comprehensive metabolic panel     Status: Abnormal  Collection Time: 05/23/17  1:32 PM  Result Value Ref Range   Sodium 142 135 - 145 mmol/L   Potassium 2.7 (LL) 3.5 - 5.1 mmol/L    Comment: CRITICAL RESULT CALLED TO, READ BACK BY AND VERIFIED WITH: Q.WATERS AT 1455 ON 05/23/17 BY N.THOMPSON    Chloride 99 (L) 101 - 111 mmol/L   CO2 33 (H) 22 - 32 mmol/L   Glucose, Bld 113 (H) 65 - 99 mg/dL   BUN 36 (H) 6 - 20 mg/dL   Creatinine, Ser 1.42 (H) 0.61 - 1.24 mg/dL   Calcium 8.8 (L) 8.9 - 10.3 mg/dL   Total Protein 7.4 6.5 - 8.1 g/dL   Albumin 3.5 3.5 - 5.0 g/dL   AST 17 15 - 41 U/L   ALT 10 (L) 17 - 63 U/L   Alkaline Phosphatase 118 38 - 126 U/L   Total Bilirubin 1.5 (H) 0.3 - 1.2 mg/dL   GFR calc non Af Amer 49 (L) >60 mL/min   GFR calc Af Amer 57 (L) >60 mL/min    Comment: (NOTE) The eGFR has been calculated using the CKD EPI  equation. This calculation has not been validated in all clinical situations. eGFR's persistently <60 mL/min signify possible Chronic Kidney Disease.    Anion gap 10 5 - 15  CBC with Differential     Status: Abnormal   Collection Time: 05/23/17  1:32 PM  Result Value Ref Range   WBC 5.5 4.0 - 10.5 K/uL   RBC 4.57 4.22 - 5.81 MIL/uL   Hemoglobin 11.4 (L) 13.0 - 17.0 g/dL   HCT 37.6 (L) 39.0 - 52.0 %   MCV 82.3 78.0 - 100.0 fL   MCH 24.9 (L) 26.0 - 34.0 pg   MCHC 30.3 30.0 - 36.0 g/dL   RDW 18.3 (H) 11.5 - 15.5 %   Platelets 111 (L) 150 - 400 K/uL    Comment: SPECIMEN CHECKED FOR CLOTS REPEATED TO VERIFY PLATELET COUNT CONFIRMED BY SMEAR    Neutrophils Relative % 80 %   Neutro Abs 4.4 1.7 - 7.7 K/uL   Lymphocytes Relative 7 %   Lymphs Abs 0.4 (L) 0.7 - 4.0 K/uL   Monocytes Relative 12 %   Monocytes Absolute 0.6 0.1 - 1.0 K/uL   Eosinophils Relative 1 %   Eosinophils Absolute 0.1 0.0 - 0.7 K/uL   Basophils Relative 0 %   Basophils Absolute 0.0 0.0 - 0.1 K/uL  Brain natriuretic peptide     Status: Abnormal   Collection Time: 05/23/17  1:32 PM  Result Value Ref Range   B Natriuretic Peptide 487.5 (H) 0.0 - 100.0 pg/mL  Magnesium     Status: None   Collection Time: 05/23/17  1:32 PM  Result Value Ref Range   Magnesium 2.0 1.7 - 2.4 mg/dL  I-stat troponin, ED     Status: None   Collection Time: 05/23/17  1:44 PM  Result Value Ref Range   Troponin i, poc 0.06 0.00 - 0.08 ng/mL   Comment 3            Comment: Due to the release kinetics of cTnI, a negative result within the first hours of the onset of symptoms does not rule out myocardial infarction with certainty. If myocardial infarction is still suspected, repeat the test at appropriate intervals.   I-Stat CG4 Lactic Acid, ED     Status: None   Collection Time: 05/23/17  2:59 PM  Result Value Ref Range   Lactic Acid, Venous  0.91 0.5 - 1.9 mmol/L   Dg Chest 2 View  Result Date: 05/23/2017 CLINICAL DATA:   66 year old male with shortness of breath and cough for the past 2 weeks EXAM: CHEST  2 VIEW COMPARISON:  Prior chest x-ray 10/08/2016 FINDINGS: Patchy left lower lobe airspace opacity with positive spine sign on the lateral view. Findings are concerning for left lower lobe pneumonia. Stable cardiomegaly. No pleural effusion or pneumothorax. No evidence of pulmonary edema. No acute osseous abnormality. IMPRESSION: Patchy left lower lobe airspace opacity concerning for bronchopneumonia. Followup PA and lateral chest X-ray is recommended in 3-4 weeks following trial of antibiotic therapy to ensure resolution and exclude underlying malignancy. Electronically Signed   By: Jacqulynn Cadet M.D.   On: 05/23/2017 13:56    Pending Labs Unresulted Labs (From admission, onward)   Start     Ordered   05/23/17 1412  Culture, blood (routine x 2)  BLOOD CULTURE X 2,   STAT     05/23/17 1411      Vitals/Pain Today's Vitals   05/23/17 1454 05/23/17 1630 05/23/17 1700 05/23/17 1705  BP: (!) 131/106 (!) 134/93 (!) 123/97 (!) 123/97  Pulse: 70 (!) 59 61 65  Resp: 15 (!) 25 (!) 23 15  Temp:      TempSrc:      SpO2: 98% (!) 89% 100% 98%  PainSc:        Isolation Precautions No active isolations  Medications Medications  cefTRIAXone (ROCEPHIN) 1 g in dextrose 5 % 50 mL IVPB (0 g Intravenous Stopped 05/23/17 1554)  azithromycin (ZITHROMAX) tablet 500 mg (500 mg Oral Given 05/23/17 1511)  potassium chloride SA (K-DUR,KLOR-CON) CR tablet 40 mEq (40 mEq Oral Given 05/23/17 1511)  sodium chloride 0.9 % bolus 500 mL (500 mLs Intravenous New Bag/Given 05/23/17 1555)    Mobility ambulatory

## 2017-05-23 NOTE — ED Notes (Signed)
Bed: WA17 Expected date:  Expected time:  Means of arrival:  Comments: EMS-SOB 

## 2017-05-23 NOTE — Progress Notes (Signed)
Patient is alert and oriented vital stable un able to lay falt of Room air with Iv Potassium for hypokelmia 2.7, tolerating well. Waiting on night time meds.

## 2017-05-23 NOTE — ED Triage Notes (Signed)
Per EMS. Pt from home. Pt reports a cough for the past 2 weeks. Began to feel SOB 2 days ago. SOB worse with laying flat and exertion. Hx of CHF and a fib.

## 2017-05-23 NOTE — ED Provider Notes (Signed)
[facility] Provider Note   CSN: [id number] Arrival date & time: 05/23/17  1233     History   Chief Complaint Chief Complaint  Patient presents with  . Cough  . Shortness of Breath    HPI Patrick [name] is a 66 y.o. male.  The history is provided by the patient. No language interpreter was used.  Cough  Associated symptoms include shortness of breath.  Shortness of Breath  Associated symptoms include cough.    Patrick [name] is a 66 y.o. male who presents to the Emergency Department complaining of sob.  He complains of 2 weeks of nonproductive cough followed by productive cough last night with orthopnea and shortness of breath.  He had to sit up straight and had difficulty sleeping.  He does have some ongoing shortness of breath and cough today.  No fevers, chest pain, diaphoresis, nausea, vomiting.  He does have progressive lower extremity edema and decreased urination.  He is compliant with his home medications.  Symptoms are moderate, constant, worsening.  Past Medical History:  Diagnosis Date  . Arthritis    "right hand; right knee" (06/19/2014)  . CAD (coronary artery disease)    2v CAD with subtotal occlusion of small co-dominant RCA and borderline lesion in moderate-sized OM-1  . CHF (congestive heart failure) (HCC)    Preserved EF  . Coronary artery disease involving native coronary artery of native heart with angina pectoris ([place]) 09/11/2016  . Degenerative joint disease of knee, right   . Dysrhythmia   . Enlarged prostate   . Hyperlipidemia   . Hypertension   . Pneumonia 05/2014  . Prediabetes 10/03/2014  . Scrotal edema 04/03/2015  . SMALL BOWEL OBSTRUCTION, HX OF 08/21/2007   Annotation: with narrowing in the ileocecal region Qualifier: Diagnosis of  By: Hassell Done FNP, Tori Milks      Patient Active Problem List   Diagnosis Date Noted  . Pneumonia 05/23/2017  . Balance problem 03/07/2017  . Protein-calorie malnutrition, severe ([place]) 12/20/2016    . Pulmonary HTN ([place]) 11/10/2016  . History of resection of rectum 09/28/2016  . Rectal cancer ([place]) 09/28/2016  . Atrial fibrillation ([place]) 09/11/2016  . Coronary artery disease involving native coronary artery of native heart with angina pectoris ([place]) 09/11/2016  . Nonischemic cardiomyopathy ([place]) 09/11/2016  . Thrombocytopenia ([place]) 05/29/2016  . Adenocarcinoma of rectum s/p robotic APR/colostomy 09/28/2016 04/14/2016  . Chronic kidney disease (CKD), stage III (moderate) ([place]) 02/05/2016  . Abnormal finding on cardiovascular stress test 02/05/2016  . Cardiomyopathy, ischemic 06/17/2015  . Chronic systolic heart failure ([place]) 06/17/2015  . Major depressive disorder, recurrent episode ([place]) 04/17/2015  . Other iron deficiency anemias   . Health care maintenance 03/11/2014  . Obesity, unspecified 10/03/2013  . Dyslipidemia 08/13/2013  . MACULAR DEGENERATION 05/02/2008  . ERECTILE DYSFUNCTION 03/13/2007  . Essential hypertension 01/16/2007  . BPH (benign prostatic hyperplasia) 01/16/2007    Past Surgical History:  Procedure Laterality Date  . CARDIAC CATHETERIZATION N/A 02/16/2016   Procedure: Right/Left Heart Cath and Coronary Angiography;  Surgeon: Jolaine Artist, MD;  Location: [facility];  Service: Cardiovascular;  Laterality: N/A;  . COLONOSCOPY N/A 04/01/2016   Procedure: COLONOSCOPY;  Surgeon: Leighton Ruff, MD;  Location: [facility];  Service: Endoscopy;  Laterality: N/A;  . INGUINAL HERNIA REPAIR Left 1990's  . IR GENERIC HISTORICAL  07/05/2016   IR RADIOLOGIST EVAL & MGMT 07/05/2016 Corrie Mckusick, DO [facility] INTERV RAD  . IR RADIOLOGIST EVAL &  MGMT  03/29/2017  . XI ROBOT ABDOMINAL PERINEAL RESECTION N/A 09/28/2016   Procedure: XI ROBOT ABDOMINOPERINEAL RESECTION WITH PERMANENT COLOSTOMY ERAS PATHWAY;  Surgeon: Michael Boston, MD;  Location: [facility] ORS;  Service: General;  Laterality: N/A;       Home Medications    Prior to Admission medications   Medication Sig  Start Date End Date Taking? Authorizing Provider  acetaminophen (TYLENOL) 500 MG tablet Take 1,000 mg by mouth every 6 (six) hours as needed (for pain/fever/headaches.).    Yes [provider]  allopurinol (ZYLOPRIM) 300 MG tablet take 1 tablet by mouth once daily 04/03/17  Yes Mayo, Pete Pelt, MD  apixaban (ELIQUIS) 5 MG TABS tablet Take 1 tablet (5 mg total) by mouth 2 (two) times daily. 01/26/17  Yes Minus Breeding, MD  Ascorbic Acid (VITAMIN C) 1000 MG tablet Take 2,000 mg by mouth 2 (two) times daily.   Yes [provider]  buPROPion (WELLBUTRIN) 100 MG tablet take 1 tablet by mouth twice a day 03/14/17  Yes Mikell, Jeani Sow, MD  carvedilol (COREG) 25 MG tablet take 1 tablets by mouth twice a day 02/21/17  Yes Minus Breeding, MD  colchicine 0.6 MG tablet Take 0.6 mg by mouth 2 (two) times daily as needed.   Yes [provider]  ferrous sulfate 325 (65 FE) MG tablet Take 1 tablet (325 mg total) by mouth 2 (two) times daily with a meal. 12/26/16  Yes Mayo, Pete Pelt, MD  finasteride (PROSCAR) 5 MG tablet take 1 tablet by mouth once daily 03/14/17  Yes Mikell, Jeani Sow, MD  losartan (COZAAR) 50 MG tablet Take 1 tablet (50 mg total) by mouth daily. 12/09/16  Yes Mayo, Pete Pelt, MD  lovastatin (MEVACOR) 20 MG tablet Take 1 tablet (20 mg total) by mouth at bedtime. 12/26/16  Yes Mayo, Pete Pelt, MD  Multiple Vitamins-Minerals (MULTIVITAMIN WITH MINERALS) tablet Take 1 tablet by mouth daily. Men's 50+ Multivitamin   Yes [provider]  nitroGLYCERIN (NITROSTAT) 0.4 MG SL tablet place 1 tablet under the tongue if needed every 5 minutes for chest pain for 3 doses IF NO RELIEF AFTER 1ST DOSE CALL 911. 05/10/17  Yes Mayo, Pete Pelt, MD  Omega-3 1000 MG CAPS Take 2 capsules (2,000 mg total) by mouth 2 (two) times daily. 12/09/16  Yes Mayo, Pete Pelt, MD  oxyCODONE-acetaminophen (PERCOCET/ROXICET) 5-325 MG tablet Take 1 tablet by mouth every 6 (six) hours as needed for  severe pain.   Yes [provider]  potassium chloride SA (K-DUR,KLOR-CON) 20 MEQ tablet Take 60 mEq by mouth every morning. And 20 meq by mouth in the evening   Yes [provider]  tamsulosin (FLOMAX) 0.4 MG CAPS capsule Take 0.4 mg by mouth daily.    Yes [provider]  torsemide (DEMADEX) 20 MG tablet Take 20 mg by mouth daily after supper. TAKE 3 TABLETS (60MG ) IN THE AFTERNOON   Yes [provider]  traZODone (DESYREL) 100 MG tablet Take 3 tablets (300 mg total) by mouth at bedtime. 01/02/17  Yes Mayo, Pete Pelt, MD  methocarbamol (ROBAXIN) 750 MG tablet Take 1 tablet (750 mg total) by mouth 4 (four) times daily as needed (use for muscle cramps/pain). Patient not taking: Reported on 03/29/2017 09/28/16   Michael Boston, MD  mirtazapine (REMERON) 30 MG tablet Take 1 tablet (30 mg total) by mouth at bedtime. Patient not taking: Reported on 05/23/2017 10/19/16   Truitt Merle, MD    Family History Family History  Problem Relation Age of Onset  . Hypertension Mother   . Diabetes Mother   . Stroke Mother   . Cancer Father 69       Prostate  . Dementia Father   . COPD Sister   . Arthritis Sister   . Edema Sister     Social History Social History   Tobacco Use  . Smoking status: Former Smoker    Years: 5.00    Types: Pipe, Landscape architect  . Smokeless tobacco: Never Used  . Tobacco comment: 06/19/2014 "stopped smoking in ~ 2014; used to smoke a pipe or cigar a couple times/month"  Substance Use Topics  . Alcohol use: No  . Drug use: No     Allergies   Nsaids   Review of Systems Review of Systems  Respiratory: Positive for cough and shortness of breath.   All other systems reviewed and are negative.    Physical Exam Updated Vital Signs BP (!) 123/97   Pulse 65   Temp 97.8 F (36.6 C) (Oral)   Resp 15   SpO2 98%   Physical Exam  Constitutional: He is oriented to person, place, and time. He appears well-developed and well-nourished.  HENT:    Head: Normocephalic and atraumatic.  Cardiovascular: Normal rate and regular rhythm.  No murmur heard. Pulmonary/Chest: Effort normal and breath sounds normal. No respiratory distress.  Abdominal: Soft. There is no rebound and no guarding.  Colostomy in the left lower quadrant with a parastomal hernia  Musculoskeletal:  2+ pitting edema to the left lower extremity, 1+ pitting edema to the right lower extremity  Neurological: He is alert and oriented to person, place, and time.  Skin: Skin is warm and dry.  Psychiatric: He has a normal mood and affect. His behavior is normal.  Nursing note and vitals reviewed.    ED Treatments / Results  Labs (all labs ordered are listed, but only abnormal results are displayed) Labs Reviewed  COMPREHENSIVE METABOLIC PANEL - Abnormal; Notable for the following components:      Result Value   Potassium 2.7 (*)    Chloride 99 (*)    CO2 33 (*)    Glucose, Bld 113 (*)    BUN 36 (*)    Creatinine, Ser 1.42 (*)    Calcium 8.8 (*)    ALT 10 (*)    Total Bilirubin 1.5 (*)    GFR calc non Af Amer 49 (*)    GFR calc Af Amer 57 (*)    All other components within normal limits  CBC WITH DIFFERENTIAL/PLATELET - Abnormal; Notable for the following components:   Hemoglobin 11.4 (*)    HCT 37.6 (*)    MCH 24.9 (*)    RDW 18.3 (*)    Platelets 111 (*)    Lymphs Abs 0.4 (*)    All other components within normal limits  BRAIN NATRIURETIC PEPTIDE - Abnormal; Notable for the following components:   B Natriuretic Peptide 487.5 (*)    All other components within normal limits  CULTURE, BLOOD (ROUTINE X 2)  CULTURE, BLOOD (ROUTINE X 2)  MAGNESIUM  I-STAT TROPONIN, ED  I-STAT CG4 LACTIC ACID, ED    EKG  EKG Interpretation  Date/Time:  Tuesday May 23 2017 12:44:10 EST Ventricular Rate:  62 PR Interval:    QRS Duration: 189 QT Interval:  611 QTC Calculation: 621 R Axis:   152 Text Interpretation:  Atrial fibrillation Right bundle branch block  Borderline ST depression, lateral leads Baseline wander  in lead(s) I III aVL Confirmed by Quintella Reichert [phone]) on 05/23/2017 12:51:49 PM       Radiology Dg Chest 2 View  Result Date: 05/23/2017 CLINICAL DATA:  66 year old male with shortness of breath and cough for the past 2 weeks EXAM: CHEST  2 VIEW COMPARISON:  Prior chest x-ray 10/08/2016 FINDINGS: Patchy left lower lobe airspace opacity with positive spine sign on the lateral view. Findings are concerning for left lower lobe pneumonia. Stable cardiomegaly. No pleural effusion or pneumothorax. No evidence of pulmonary edema. No acute osseous abnormality. IMPRESSION: Patchy left lower lobe airspace opacity concerning for bronchopneumonia. Followup PA and lateral chest X-ray is recommended in 3-4 weeks following trial of antibiotic therapy to ensure resolution and exclude underlying malignancy. Electronically Signed   By: Jacqulynn Cadet M.D.   On: 05/23/2017 13:56    Procedures Procedures (including critical care time)  Medications Ordered in ED Medications  cefTRIAXone (ROCEPHIN) 1 g in dextrose 5 % 50 mL IVPB (0 g Intravenous Stopped 05/23/17 1554)  azithromycin (ZITHROMAX) tablet 500 mg (500 mg Oral Given 05/23/17 1511)  potassium chloride SA (K-DUR,KLOR-CON) CR tablet 40 mEq (40 mEq Oral Given 05/23/17 1511)  sodium chloride 0.9 % bolus 500 mL (0 mLs Intravenous Stopped 05/23/17 1711)     Initial Impression / Assessment and Plan / ED Course  I have reviewed the triage vital signs and the nursing notes.  Pertinent labs & imaging results that were available during my care of the patient were reviewed by me and considered in my medical decision making (see chart for details).     Pt here with progressive sob/orthopnea as well as cough.  He is in no acute distress in ED.  CXR concerning for LL infiltrate - treated with abx for CAP.  BMP with hyponatremia - replaced orally.  Medicine consulted for admission for observation due  to pna, pt significantly symptomatic.    Final Clinical Impressions(s) / ED Diagnoses   Final diagnoses:  None    ED Discharge Orders    None       Quintella Reichert, MD 05/23/17 1759

## 2017-05-23 NOTE — ED Notes (Signed)
[facility] called @ 16:25 by Pauline Good.

## 2017-05-24 DIAGNOSIS — C642 Malignant neoplasm of left kidney, except renal pelvis: Secondary | ICD-10-CM | POA: Diagnosis not present

## 2017-05-24 DIAGNOSIS — D696 Thrombocytopenia, unspecified: Secondary | ICD-10-CM

## 2017-05-24 DIAGNOSIS — J159 Unspecified bacterial pneumonia: Secondary | ICD-10-CM | POA: Diagnosis not present

## 2017-05-24 DIAGNOSIS — N183 Chronic kidney disease, stage 3 (moderate): Secondary | ICD-10-CM | POA: Diagnosis not present

## 2017-05-24 DIAGNOSIS — J180 Bronchopneumonia, unspecified organism: Secondary | ICD-10-CM | POA: Diagnosis not present

## 2017-05-24 DIAGNOSIS — I4510 Unspecified right bundle-branch block: Secondary | ICD-10-CM | POA: Diagnosis not present

## 2017-05-24 DIAGNOSIS — I482 Chronic atrial fibrillation: Secondary | ICD-10-CM

## 2017-05-24 DIAGNOSIS — R05 Cough: Secondary | ICD-10-CM | POA: Diagnosis not present

## 2017-05-24 DIAGNOSIS — E871 Hypo-osmolality and hyponatremia: Secondary | ICD-10-CM | POA: Diagnosis not present

## 2017-05-24 DIAGNOSIS — H3530 Unspecified macular degeneration: Secondary | ICD-10-CM | POA: Diagnosis not present

## 2017-05-24 DIAGNOSIS — K7460 Unspecified cirrhosis of liver: Secondary | ICD-10-CM

## 2017-05-24 DIAGNOSIS — I130 Hypertensive heart and chronic kidney disease with heart failure and stage 1 through stage 4 chronic kidney disease, or unspecified chronic kidney disease: Secondary | ICD-10-CM | POA: Diagnosis not present

## 2017-05-24 DIAGNOSIS — I428 Other cardiomyopathies: Secondary | ICD-10-CM

## 2017-05-24 DIAGNOSIS — I10 Essential (primary) hypertension: Secondary | ICD-10-CM

## 2017-05-24 DIAGNOSIS — J189 Pneumonia, unspecified organism: Secondary | ICD-10-CM | POA: Diagnosis not present

## 2017-05-24 DIAGNOSIS — E785 Hyperlipidemia, unspecified: Secondary | ICD-10-CM | POA: Diagnosis not present

## 2017-05-24 DIAGNOSIS — I272 Pulmonary hypertension, unspecified: Secondary | ICD-10-CM | POA: Diagnosis not present

## 2017-05-24 DIAGNOSIS — I255 Ischemic cardiomyopathy: Secondary | ICD-10-CM | POA: Diagnosis not present

## 2017-05-24 DIAGNOSIS — F339 Major depressive disorder, recurrent, unspecified: Secondary | ICD-10-CM | POA: Diagnosis present

## 2017-05-24 DIAGNOSIS — R748 Abnormal levels of other serum enzymes: Secondary | ICD-10-CM | POA: Diagnosis not present

## 2017-05-24 DIAGNOSIS — E876 Hypokalemia: Secondary | ICD-10-CM

## 2017-05-24 DIAGNOSIS — R7303 Prediabetes: Secondary | ICD-10-CM | POA: Diagnosis not present

## 2017-05-24 DIAGNOSIS — M1711 Unilateral primary osteoarthritis, right knee: Secondary | ICD-10-CM | POA: Diagnosis not present

## 2017-05-24 DIAGNOSIS — Z87891 Personal history of nicotine dependence: Secondary | ICD-10-CM | POA: Diagnosis not present

## 2017-05-24 DIAGNOSIS — I5023 Acute on chronic systolic (congestive) heart failure: Secondary | ICD-10-CM | POA: Diagnosis not present

## 2017-05-24 DIAGNOSIS — R0602 Shortness of breath: Secondary | ICD-10-CM | POA: Diagnosis not present

## 2017-05-24 DIAGNOSIS — N179 Acute kidney failure, unspecified: Secondary | ICD-10-CM | POA: Diagnosis not present

## 2017-05-24 DIAGNOSIS — N400 Enlarged prostate without lower urinary tract symptoms: Secondary | ICD-10-CM | POA: Diagnosis not present

## 2017-05-24 DIAGNOSIS — I2510 Atherosclerotic heart disease of native coronary artery without angina pectoris: Secondary | ICD-10-CM | POA: Diagnosis not present

## 2017-05-24 DIAGNOSIS — N141 Nephropathy induced by other drugs, medicaments and biological substances: Secondary | ICD-10-CM | POA: Diagnosis not present

## 2017-05-24 LAB — CBC
HCT: 38.6 % — ABNORMAL LOW (ref 39.0–52.0)
Hemoglobin: 12 g/dL — ABNORMAL LOW (ref 13.0–17.0)
MCH: 25.3 pg — ABNORMAL LOW (ref 26.0–34.0)
MCHC: 31.1 g/dL (ref 30.0–36.0)
MCV: 81.4 fL (ref 78.0–100.0)
PLATELETS: 123 10*3/uL — AB (ref 150–400)
RBC: 4.74 MIL/uL (ref 4.22–5.81)
RDW: 18.6 % — AB (ref 11.5–15.5)
WBC: 7.4 10*3/uL (ref 4.0–10.5)

## 2017-05-24 LAB — BASIC METABOLIC PANEL
Anion gap: 14 (ref 5–15)
BUN: 32 mg/dL — AB (ref 6–20)
CALCIUM: 8.8 mg/dL — AB (ref 8.9–10.3)
CO2: 25 mmol/L (ref 22–32)
CREATININE: 1.48 mg/dL — AB (ref 0.61–1.24)
Chloride: 100 mmol/L — ABNORMAL LOW (ref 101–111)
GFR, EST AFRICAN AMERICAN: 54 mL/min — AB (ref >60)
GFR, EST NON AFRICAN AMERICAN: 47 mL/min — AB (ref >60)
Glucose, Bld: 95 mg/dL (ref 65–99)
Potassium: 3.3 mmol/L — ABNORMAL LOW (ref 3.5–5.1)
SODIUM: 139 mmol/L (ref 135–145)

## 2017-05-24 LAB — HIV ANTIBODY (ROUTINE TESTING W REFLEX): HIV SCREEN 4TH GENERATION: NONREACTIVE

## 2017-05-24 LAB — TROPONIN I
TROPONIN I: 0.05 ng/mL — AB (ref <0.03)
TROPONIN I: 0.06 ng/mL — AB (ref <0.03)

## 2017-05-24 LAB — MAGNESIUM: Magnesium: 2 mg/dL (ref 1.7–2.4)

## 2017-05-24 MED ORDER — TORSEMIDE 20 MG PO TABS
80.0000 mg | ORAL_TABLET | Freq: Every day | ORAL | Status: DC
  Administered 2017-05-25 – 2017-05-26 (×2): 80 mg via ORAL
  Filled 2017-05-24 (×2): qty 4

## 2017-05-24 MED ORDER — POTASSIUM CHLORIDE CRYS ER 20 MEQ PO TBCR
40.0000 meq | EXTENDED_RELEASE_TABLET | Freq: Two times a day (BID) | ORAL | Status: DC
  Administered 2017-05-24 – 2017-05-25 (×4): 40 meq via ORAL
  Filled 2017-05-24 (×6): qty 2

## 2017-05-24 MED ORDER — MUPIROCIN 2 % EX OINT
1.0000 "application " | TOPICAL_OINTMENT | Freq: Two times a day (BID) | CUTANEOUS | Status: DC
  Administered 2017-05-24 – 2017-05-26 (×5): 1 via NASAL
  Filled 2017-05-24 (×2): qty 22

## 2017-05-24 MED ORDER — TORSEMIDE 20 MG PO TABS
20.0000 mg | ORAL_TABLET | Freq: Every day | ORAL | Status: DC
  Filled 2017-05-24: qty 1

## 2017-05-24 MED ORDER — CHLORHEXIDINE GLUCONATE CLOTH 2 % EX PADS
6.0000 | MEDICATED_PAD | Freq: Every day | CUTANEOUS | Status: DC
  Administered 2017-05-24 – 2017-05-26 (×3): 6 via TOPICAL

## 2017-05-24 NOTE — Evaluation (Signed)
Physical Therapy Evaluation Patient Details Name: Patrick [name] MRN: [id number] DOB: 1950/09/09 Today's Date: 05/24/2017   History of Present Illness  Pt is a 66 y.o. male admitted 05/23/17 with c/o SOB; CXR concerning for left lower lobe bronchopneumonia. Pertinent PMH includes CAD, HTN, HLD, prediabetes, HF, OA, A-fib, rectal cancer (colostomy bag).    Clinical Impression  Pt presents with an overall decrease in functional mobility secondary to above. PTA, pt indep and lives at home alone; reports increasing difficulty managing at home, and pt is scheduled to move into [facility] on Monday 12/31 (will need to return home to pack for this at d/c). Today, pt able to amb with single UE support and intermittent min guard for balance. Pt reports he will have assist from neighbor if need while at home. Pt would benefit from continued acute PT services to maximize functional mobility and independence prior to d/c with continued PT at [facility].     Follow Up Recommendations SNF(PT at [facility])    Equipment Recommendations  None recommended by PT    Recommendations for Other Services       Precautions / Restrictions Precautions Precautions: Fall Precaution Comments: Colostomy Restrictions Weight Bearing Restrictions: No      Mobility  Bed Mobility               General bed mobility comments: Received sitting in chair  Transfers Overall transfer level: Needs assistance Equipment used: Rolling walker (2 wheeled);None Transfers: Sit to/from Stand Sit to Stand: Min guard         General transfer comment: Min guard for balance and safety  Ambulation/Gait Ambulation/Gait assistance: Min guard Ambulation Distance (Feet): 100 Feet(+50) Assistive device: 1 person hand held assist Gait Pattern/deviations: Step-through pattern;Decreased stride length Gait velocity: Decreased Gait velocity interpretation: <1.8 ft/sec, indicative of risk for recurrent falls General  Gait Details: Pt able to amb with LUE support on IV pole and intermittent min guard for balance; requiring 1x standing rest break secondary to fatigue  Stairs            Wheelchair Mobility    Modified Rankin (Stroke Patients Only)       Balance Overall balance assessment: Needs assistance   Sitting balance-Leahy Scale: Fair       Standing balance-Leahy Scale: Fair                               Pertinent Vitals/Pain Pain Assessment: No/denies pain    Home Living Family/patient expects to be discharged to:: Skilled nursing facility([facility]) Living Arrangements: Alone               Additional Comments: Pt plans to move into [facility] on Monday 05/29/17    Prior Function Level of Independence: Independent with assistive device(s)         Comments: Uses [facility]; does not drive. Sister lives at [facility] SNF     Hand Dominance        Extremity/Trunk Assessment   Upper Extremity Assessment Upper Extremity Assessment: Generalized weakness    Lower Extremity Assessment Lower Extremity Assessment: Generalized weakness       Communication   Communication: No difficulties  Cognition Arousal/Alertness: Awake/alert Behavior During Therapy: WFL for tasks assessed/performed Overall Cognitive Status: Within Functional Limits for tasks assessed  General Comments      Exercises     Assessment/Plan    PT Assessment Patient needs continued PT services  PT Problem List Decreased strength;Decreased activity tolerance;Decreased balance;Decreased mobility       PT Treatment Interventions DME instruction;Gait training;Therapeutic exercise;Stair training;Therapeutic activities;Functional mobility training;Balance training;Patient/family education    PT Goals (Current goals can be found in the Care Plan section)  Acute Rehab PT Goals Patient Stated Goal: Move into [place]  on Monday PT Goal Formulation: With patient Time For Goal Achievement: 06/07/17 Potential to Achieve Goals: Good    Frequency Min 2X/week   Barriers to discharge Decreased caregiver support      Co-evaluation               AM-PAC PT "6 Clicks" Daily Activity  Outcome Measure Difficulty turning over in bed (including adjusting bedclothes, sheets and blankets)?: A Little Difficulty moving from lying on back to sitting on the side of the bed? : A Little Difficulty sitting down on and standing up from a chair with arms (e.g., wheelchair, bedside commode, etc,.)?: A Little Help needed moving to and from a bed to chair (including a wheelchair)?: A Little Help needed walking in hospital room?: A Little Help needed climbing 3-5 steps with a railing? : A Lot 6 Click Score: 17    End of Session Equipment Utilized During Treatment: Gait belt Activity Tolerance: Patient tolerated treatment well;Patient limited by fatigue Patient left: in chair;with call bell/phone within reach Nurse Communication: Mobility status PT Visit Diagnosis: Other abnormalities of gait and mobility (R26.89);Muscle weakness (generalized) (M62.81)    Time: 8366-2947 PT Time Calculation (min) (ACUTE ONLY): 24 min   Charges:   PT Evaluation $PT Eval Moderate Complexity: 1 Mod PT Treatments $Gait Training: 8-22 mins   PT G Codes:   PT G-Codes **NOT FOR INPATIENT CLASS** Functional Assessment Tool Used: AM-PAC 6 Clicks Basic Mobility Functional Limitation: Mobility: Walking and moving around Mobility: Walking and Moving Around Current Status (M5465): At least 40 percent but less than 60 percent impaired, limited or restricted Mobility: Walking and Moving Around Goal Status [phone]): At least 1 percent but less than 20 percent impaired, limited or restricted   Mabeline Caras, PT, DPT Acute Rehab Services  Pager: [place] 05/24/2017, 2:51 PM

## 2017-05-24 NOTE — Consult Note (Signed)
   [facility] CM Inpatient Consult   05/24/2017  Patrick [name] Aug 01, 1950 [id number]  Patient is active with [facility] for chronic disease management services prior to admission.  Patient has been engaged by a [employer] and CSW.  Chart was reviewed for [facility] services in the [place].  Current plan is for the patient to go to [facility] for Long term care residence.  Met with the patient at bedside.  He states he is hopeful to get out of the hospital to go to [facility].  He states, "I have to pack a few things before I leave the house, I will miss my neighbor who is going to help me.  I just [name]'t have any energy or desire to do much of anything since my surgery.  I know this is a good move for me and I will be with my sister."   Made Inpatient social worker, Caryl Pina, aware patient [name] had been following for LTC facility needs. Of note, [facility] Care Management services does not replace or interfere with any services that are needed or arranged by inpatient case management or social work.  For additional questions or referrals please contact:  Natividad Brood, RN BSN [facility] Liaison  [phone] business mobile phone Toll free office [phone]

## 2017-05-24 NOTE — Progress Notes (Signed)
CCMD called pt had 5 beats run of V Tach, HR drop to 39, on assessment pt asymtomytic, sitting up eating lunch, MD notified,HR currently in high 60s.

## 2017-05-24 NOTE — Progress Notes (Signed)
Family Medicine Teaching Service Daily Progress Note Intern Pager: [phone]  Patient name: Patrick [name] Medical record number: [id number] Date of birth: [birth year] Age: 66 y.o. Gender: male  Primary Care Provider: Mayo, Pete Pelt, MD Consultants: None  Code Status: Full   Pt Overview and Major Events to Date:   Assessment and Plan:  CHRLES SELLEY is a 66 y.o. male presenting with SOB . PMH is significant for CAD, Enlarged prostate, HTN, HLD, hx of small bowel obstruction, Prediabetes, HFrEF, Osteoarthritis, Afib, Hx of Rectal cancer   SOB likely due to Pneumonia: Improved. Chest x-ray concerning for left lower lobe bronchopneumonia.  WBC within normal limits. Negative lactic acid. Strept Pneumoniae negative  -Continue ceftriaxone and azithromycin for CAP - transition to PO after 48hrs - Legionella urine antigen pending  - Blood cultures pending   Hypokalemia:  Potassium 3.3 this AM. Received potassium 70 meq  - Continue KDUR 40 meq BID   CKD: Scr 1.48. Baseline SCr 1.35. Close to baseline  - No concern about AKI  - Continue ARB for now   CAD: Cardiac Cath in 2017 2v CAD with subtotal occlusion of small co-dominant RCA and borderline lesion in moderate-sized OM, Severe, predominantly non-ischemic CM EF 20%. Troponin 0.05 x2, 0.06. Slight increase in troponin not likely due to acute cardiac event. Hx of slight elevated troponin since 2016.  AM EKG RBB + Afib similar to previous - Continue Coreg   HFrEF: EF 30-35%,G2DD in  2017. Last Dry weight noted was 200 lbs. Patient is currently 200 lbs, previously in the cardiac clinic btw 196 lbs- 203 lbs - Output today 800 cc overnight  - Continue home toresmide 60 mg in AM and  20 mg PM  - Coreg 25 mg BID  - Losartan   AF: EKG with Afib, RBB; similar to previous back in June. CHA2DS2 - VASc score of 5 - Currently on Eliquis   HTN: Normotensive  - Continue losartan  Enlarged prostate:  - Home Proscar and Flomax   FEN/GI: Heart  Healthy Diet  Prophylaxis: Elqiuis   Disposition:SNF placement outpatient on December 31st, obtain PT/OT evaluation   Subjective:  Patient is doing well. He denies any SOB and chest pain.   Objective: Temp:  [97.5 F (36.4 C)-97.9 F (36.6 C)] 97.8 F (36.6 C) (12/26 0602) Pulse Rate:  [59-79] 71 (12/26 0602) Resp:  [15-25] 16 (12/26 0602) BP: (110-134)/(79-106) 129/79 (12/26 0602) SpO2:  [89 %-100 %] 95 % (12/26 0602) Weight:  [198 lb 14.4 oz (90.2 kg)-200 lb 12.8 oz (91.1 kg)] 200 lb 12.8 oz (91.1 kg) (12/26 0602) Physical Exam: General: Elderly gentleman  Cardiovascular: RRR, no murmur  Respiratory: CTAB, no wheezing, no increased WOB  Abdomen: BS+, non-tender,colostomy bag in place  Extremities: Bilateral lower extremity edema 2+ - slept in the chair overnight   Laboratory: Recent Labs  Lab 05/23/17 1332  WBC 5.5  HGB 11.4*  HCT 37.6*  PLT 111*   Recent Labs  Lab 05/23/17 1332  NA 142  K 2.7*  CL 99*  CO2 33*  BUN 36*  CREATININE 1.42*  CALCIUM 8.8*  PROT 7.4  BILITOT 1.5*  ALKPHOS 118  ALT 10*  AST 17  GLUCOSE 113*    Imaging/Diagnostic Tests: Dg Chest 2 View  Result Date: 05/23/2017 CLINICAL DATA:  66 year old male with shortness of breath and cough for the past 2 weeks EXAM: CHEST  2 VIEW COMPARISON:  Prior chest x-ray 10/08/2016 FINDINGS: Patchy left lower lobe airspace opacity  with positive spine sign on the lateral view. Findings are concerning for left lower lobe pneumonia. Stable cardiomegaly. No pleural effusion or pneumothorax. No evidence of pulmonary edema. No acute osseous abnormality. IMPRESSION: Patchy left lower lobe airspace opacity concerning for bronchopneumonia. Followup PA and lateral chest X-ray is recommended in 3-4 weeks following trial of antibiotic therapy to ensure resolution and exclude underlying malignancy. Electronically Signed   By: Jacqulynn Cadet M.D.   On: 05/23/2017 13:56    Tonette Bihari, MD 05/24/2017,  6:37 AM PGY-3, [place] Intern pager: [phone], text pages welcome

## 2017-05-25 LAB — BASIC METABOLIC PANEL
ANION GAP: 12 (ref 5–15)
BUN: 31 mg/dL — ABNORMAL HIGH (ref 6–20)
CO2: 31 mmol/L (ref 22–32)
Calcium: 8.7 mg/dL — ABNORMAL LOW (ref 8.9–10.3)
Chloride: 95 mmol/L — ABNORMAL LOW (ref 101–111)
Creatinine, Ser: 1.64 mg/dL — ABNORMAL HIGH (ref 0.61–1.24)
GFR calc Af Amer: 48 mL/min — ABNORMAL LOW (ref >60)
GFR calc non Af Amer: 41 mL/min — ABNORMAL LOW (ref >60)
GLUCOSE: 207 mg/dL — AB (ref 65–99)
POTASSIUM: 3.7 mmol/L (ref 3.5–5.1)
Sodium: 138 mmol/L (ref 135–145)

## 2017-05-25 LAB — CBC
HEMATOCRIT: 36.9 % — AB (ref 39.0–52.0)
HEMOGLOBIN: 10.9 g/dL — AB (ref 13.0–17.0)
MCH: 24.7 pg — AB (ref 26.0–34.0)
MCHC: 29.5 g/dL — AB (ref 30.0–36.0)
MCV: 83.7 fL (ref 78.0–100.0)
Platelets: 114 10*3/uL — ABNORMAL LOW (ref 150–400)
RBC: 4.41 MIL/uL (ref 4.22–5.81)
RDW: 19.1 % — ABNORMAL HIGH (ref 11.5–15.5)
WBC: 5.3 10*3/uL (ref 4.0–10.5)

## 2017-05-25 LAB — LEGIONELLA PNEUMOPHILA SEROGP 1 UR AG: L. pneumophila Serogp 1 Ur Ag: NEGATIVE

## 2017-05-25 MED ORDER — LEVOFLOXACIN 750 MG PO TABS
750.0000 mg | ORAL_TABLET | ORAL | Status: DC
  Administered 2017-05-26: 750 mg via ORAL
  Filled 2017-05-25: qty 1

## 2017-05-25 MED ORDER — CARVEDILOL 25 MG PO TABS
25.0000 mg | ORAL_TABLET | Freq: Two times a day (BID) | ORAL | Status: DC
  Administered 2017-05-25 – 2017-05-26 (×3): 25 mg via ORAL
  Filled 2017-05-25 (×4): qty 1

## 2017-05-25 MED ORDER — CEPHALEXIN 250 MG PO CAPS: 250.0000 mg | ORAL_CAPSULE | Freq: Four times a day (QID) | ORAL | Status: DC

## 2017-05-25 MED ORDER — CEFTRIAXONE SODIUM 1 G IJ SOLR
1.0000 g | INTRAMUSCULAR | Status: DC
  Administered 2017-05-25: 1 g via INTRAVENOUS
  Filled 2017-05-25: qty 10

## 2017-05-25 MED ORDER — FERROUS SULFATE 325 (65 FE) MG PO TABS
325.0000 mg | ORAL_TABLET | Freq: Two times a day (BID) | ORAL | Status: DC
  Administered 2017-05-25 – 2017-05-26 (×3): 325 mg via ORAL
  Filled 2017-05-25 (×4): qty 1

## 2017-05-25 NOTE — Discharge Summary (Signed)
[facility] Discharge Summary  Patient name: Patrick [name] Medical record number: [id number] Date of birth: March 17, 1951 Age: 66 y.o. Gender: male Date of Admission: 05/23/2017  Date of Discharge: 05/26/2017 Admitting Physician: Blane Ohara McDiarmid, MD  Primary Care Provider: Mayo, Pete Pelt, MD Consultants: None  Indication for Hospitalization: CAP  Discharge Diagnoses/Problem List:  CAP CKD III CAD HFrEF Atrial Fibrillation HTN Hx colorectal cancer s/p colectomy, now with colostomy BPH with chronic indwelling foley  Disposition: home  Discharge Condition: medically stable  Discharge Exam:  General: Sitting up in bed, in NAD, pleasant Cardiovascular: RRR, no murmur  Respiratory: normal WOB, decreased breath sounds bilaterally, CTAB Abdomen: BS+, non-tender, colostomy bag in place  Extremities: Bilateral lower extremity edema 1+   Brief Hospital Course:  Patrick [name] is a 66 year old male presenting to the emergency department with worsening shortness of breath over the course of 2 weeks. CXR showed a left lower lobe bronchopneumonia. Hospital course is described by problem list below.  CAP: Patient was started on IV Ceftriaxone and Azithromycin. Strep and legionella urinary antigens were negative. Blood cultures were negative. Troponins were trended to rule out ACS (given patient's cardiac history) and were stable at 0.05 > 0.05 > 0.06. EKG x 2 did not show any ST or T wave changes. After 48 hours of IV antibiotics, patient was transitioned to PO Levaquin. He was monitored for 24 hours on PO antibiotics and did well. He was discharged home because he stated he had not packed or prepared as he was planning on moving into SNF on the 31st.  CKD III: On hospital day #2, patient's creatinine bumped from 1.48 to 1.78 (baseline 1.35). Losartan was held for the remainder of his time in the hospital and good oral hydration was encouraged.   The remainder of patient's  chronic medical conditions were stable throughout admission and no changes were made to his home medications.  Issues for Follow Up:  1. Please ensure that patient has completed his full antibiotic course 2. Patient stated that he had to pack and prepare to move into SNF on 12/31 but had no one to help him. Ensure patient is able to get moved into SNF. 3. Have follow up BMP to ensure his acute AKI has resolved. If not consider stopping his home dose of torsemide.  Significant Procedures: none  Significant Labs and Imaging:  Recent Labs  Lab 05/24/17 0604 05/25/17 0922 05/26/17 0648  WBC 7.4 5.3 5.0  HGB 12.0* 10.9* 11.0*  HCT 38.6* 36.9* 36.8*  PLT 123* 114* 109*   Recent Labs  Lab 05/23/17 1332 05/24/17 0604 05/25/17 0922 05/26/17 0648  NA 142 139 138 139  K 2.7* 3.3* 3.7 3.8  CL 99* 100* 95* 100*  CO2 33* 25 31 29   GLUCOSE 113* 95 207* 110*  BUN 36* 32* 31* 28*  CREATININE 1.42* 1.48* 1.64* 1.78*  CALCIUM 8.8* 8.8* 8.7* 8.8*  MG 2.0 2.0  --   --   ALKPHOS 118  --   --   --   AST 17  --   --   --   ALT 10*  --   --   --   ALBUMIN 3.5  --   --   --    BNP 487.5 Trop 0.05 > 0.05 > 0.06 Blood cultures NG x 2 days Strep pneumo urinary antigen negative Legionella urinary antigen negative HIV negative  CXR- left lower lobe bronchopneumonia  Results/Tests Pending at Time of  Discharge: none  Discharge Medications:  Allergies as of 05/26/2017      Reactions   Nsaids    Kidney disease      Medication List    STOP taking these medications   mirtazapine 30 MG tablet Commonly known as:  REMERON     TAKE these medications   acetaminophen 500 MG tablet Commonly known as:  TYLENOL Take 1,000 mg by mouth every 6 (six) hours as needed (for pain/fever/headaches.).   allopurinol 300 MG tablet Commonly known as:  ZYLOPRIM take 1 tablet by mouth once daily   apixaban 5 MG Tabs tablet Commonly known as:  ELIQUIS Take 1 tablet (5 mg total) by mouth 2 (two) times  daily.   buPROPion 100 MG tablet Commonly known as:  WELLBUTRIN take 1 tablet by mouth twice a day   carvedilol 25 MG tablet Commonly known as:  COREG take 1 tablets by mouth twice a day   colchicine 0.6 MG tablet Take 0.6 mg by mouth 2 (two) times daily as needed.   ferrous sulfate 325 (65 FE) MG tablet Take 1 tablet (325 mg total) by mouth 2 (two) times daily with a meal.   finasteride 5 MG tablet Commonly known as:  PROSCAR take 1 tablet by mouth once daily   levofloxacin 750 MG tablet Commonly known as:  LEVAQUIN Take 1 tablet (750 mg total) by mouth daily for 3 days. Take 2 hours apart from vitamins, iron, and antacids.   losartan 50 MG tablet Commonly known as:  COZAAR Take 1 tablet (50 mg total) by mouth daily.   lovastatin 20 MG tablet Commonly known as:  MEVACOR Take 1 tablet (20 mg total) by mouth at bedtime.   methocarbamol 750 MG tablet Commonly known as:  ROBAXIN Take 1 tablet (750 mg total) by mouth 4 (four) times daily as needed (use for muscle cramps/pain).   multivitamin with minerals tablet Take 1 tablet by mouth daily. Men's 50+ Multivitamin   nitroGLYCERIN 0.4 MG SL tablet Commonly known as:  NITROSTAT place 1 tablet under the tongue if needed every 5 minutes for chest pain for 3 doses IF NO RELIEF AFTER 1ST DOSE CALL 911.   Omega-3 1000 MG Caps Take 2 capsules (2,000 mg total) by mouth 2 (two) times daily.   oxyCODONE-acetaminophen 5-325 MG tablet Commonly known as:  PERCOCET/ROXICET Take 1 tablet by mouth every 6 (six) hours as needed for severe pain.   potassium chloride SA 20 MEQ tablet Commonly known as:  K-DUR,KLOR-CON Take 2 tablets (40 mEq total) by mouth daily. And 20 meq by mouth in the evening What changed:    how much to take  when to take this   tamsulosin 0.4 MG Caps capsule Commonly known as:  FLOMAX Take 0.4 mg by mouth daily.   torsemide 20 MG tablet Commonly known as:  DEMADEX Take 2 tablets (40 mg total) by mouth  daily. TAKE 3 TABLETS (60MG ) IN THE AFTERNOON What changed:    how much to take  when to take this   traZODone 100 MG tablet Commonly known as:  DESYREL Take 3 tablets (300 mg total) by mouth at bedtime.   vitamin C 1000 MG tablet Take 2,000 mg by mouth 2 (two) times daily.       Discharge Instructions: Please refer to Patient Instructions section of EMR for full details.  Patient was counseled important signs and symptoms that should prompt return to medical care, changes in medications, dietary instructions, activity restrictions, and  follow up appointments.   Follow-Up Appointments:   Nuala Alpha, DO 05/27/2017, 5:21 PM PGY-1, [place]

## 2017-05-25 NOTE — Plan of Care (Signed)
  Education: Knowledge of General Education information will improve 05/25/2017 0745 - Progressing by Lavonna Rua, RN

## 2017-05-25 NOTE — Progress Notes (Signed)
Occupational Therapy Evaluation Patient Details Name: Patrick [name] MRN: [id number] DOB: 1950-09-07 Today's Date: 05/25/2017    History of Present Illness Pt is a 66 y.o. male admitted 05/23/17 with c/o SOB; CXR concerning for left lower lobe bronchopneumonia. Pertinent PMH includes CAD, HTN, HLD, prediabetes, HF, OA, A-fib, rectal cancer (colostomy bag).   Clinical Impression   PTA, pt lived alone, although ADL and functional mobility were becoming more difficult.Pt expressed feelings of depression during session and hopes that socialization @ [facility] will help reduce these feelings.Encouraged pt to discuss this with his therapists and MD.Pt will benefit form skilled OT services at SNF to maximize his funcitonal level of independence. Acute OT signing off. Further OT to be addressed at SNF.     Follow Up Recommendations  SNF    Equipment Recommendations  None recommended by OT    Recommendations for Other Services       Precautions / Restrictions Precautions Precautions: Fall Precaution Comments: Colostomy Restrictions Weight Bearing Restrictions: No      Mobility Bed Mobility Overal bed mobility: Modified Independent                Transfers Overall transfer level: Needs assistance Equipment used: Rolling walker (2 wheeled);None Transfers: Sit to/from Stand Sit to Stand: Supervision              Balance Overall balance assessment: Needs assistance   Sitting balance-Leahy Scale: Good       Standing balance-Leahy Scale: Fair Standing balance comment: Expresses concern for falling                           ADL either performed or assessed with clinical judgement   ADL Overall ADL's : Needs assistance/impaired                                     Functional mobility during ADLs: Supervision/safety;Rolling walker General ADL Comments: Pt set up for UB ADL and is able to coplete LB ADL with use of AE. Fatigues easily with  activity. Discussed feelings of depression during session. Hopes to feel better once he is at [facility] and is able to socialize with people. Encouraged pt to discuss these feelings with his therapists and MD.     Vision Baseline Vision/History: Wears glasses       Perception     Praxis      Pertinent Vitals/Pain       Hand Dominance Right   Extremity/Trunk Assessment Upper Extremity Assessment Upper Extremity Assessment: Generalized weakness   Lower Extremity Assessment Lower Extremity Assessment: Defer to PT evaluation   Cervical / Trunk Assessment Cervical / Trunk Assessment: Kyphotic   Communication Communication Communication: No difficulties   Cognition Arousal/Alertness: Awake/alert Behavior During Therapy: WFL for tasks assessed/performed Overall Cognitive Status: Within Functional Limits for tasks assessed                                     General Comments       Exercises     Shoulder Instructions      Home Living Family/patient expects to be discharged to:: Skilled nursing facility([facility]) Living Arrangements: Alone  Additional Comments: Pt plans to move into [facility] on Monday 05/29/17      Prior Functioning/Environment Level of Independence: Independent with assistive device(s)        Comments: Uses SPC; does not drive. Sister lives at [facility]        OT Problem List: Decreased strength;Decreased activity tolerance;Decreased knowledge of use of DME or AE      OT Treatment/Interventions:      OT Goals(Current goals can be found in the care plan section) Acute Rehab OT Goals Patient Stated Goal: Move into [facility] on Monday OT Goal Formulation: (all further OT to be addressed at [facility])  OT Frequency:     Barriers to D/C:            Co-evaluation              AM-PAC PT "6 Clicks" Daily Activity     Outcome Measure Help from another  person eating meals?: None Help from another person taking care of personal grooming?: None Help from another person toileting, which includes using toliet, bedpan, or urinal?: None Help from another person bathing (including washing, rinsing, drying)?: A Little Help from another person to put on and taking off regular upper body clothing?: None Help from another person to put on and taking off regular lower body clothing?: A Little 6 Click Score: 22   End of Session Equipment Utilized During Treatment: Gait belt;Rolling walker Nurse Communication: Mobility status  Activity Tolerance: Patient tolerated treatment well Patient left: in bed;with call bell/phone within reach;with bed alarm set  OT Visit Diagnosis: Muscle weakness (generalized) (M62.81)                Time: 9147-8295 OT Time Calculation (min): 20 min Charges:  OT General Charges $OT Visit: 1 Visit OT Evaluation $OT Eval Low Complexity: 1 Low G-Codes:     [place], OT/L  [phone] 05/25/2017  Lavergne Hiltunen,HILLARY 05/25/2017, 8:41 AM

## 2017-05-25 NOTE — Clinical Social Work Note (Signed)
Clinical Social Work Assessment  Patient Details  Name: Patrick [name] MRN: [id number] Date of Birth: June 04, 1950  Date of referral:  05/25/17               Reason for consult:  Discharge Planning, Facility Placement                Permission sought to share information with:  Family Supports Permission granted to share information::  Yes, Verbal Permission Granted  Name::     Loyed Wilmes  Agency::  [facility]  Relationship::  sister  Contact Information:  [phone]  Housing/Transportation Living arrangements for the past 2 months:  [place] of Information:  Patient Patient Interpreter Needed:  None Criminal Activity/Legal Involvement Pertinent to Current Situation/Hospitalization:  No - Comment as needed Significant Relationships:  Siblings Lives with:  Self Do you feel safe going back to the place where you live?  Yes Need for family participation in patient care:  Yes (Comment)  Care giving concerns: No family at bedside    Social Worker assessment / plan:  CSW met patient at bedside to discuss discharge needs. Patient stated that he has set up long term care at [facility] and should be able to move into the facility 05/29/2017. CSW asked patient if he was discharge before 12/31 would he want to go to facility a couple of day early or discharge home. Patient stated he would want to discharge home to be able to pack up his stuff and then transition to LTC at [facility] Employment status:  Retired Forensic scientist:  Programmer, applications PT Recommendations:  [place] / Referral to community resources:  [place]  Patient/Family's Response to care:  Patient appreciates CSW role in care  Patient/Family's Understanding of and Emotional Response to Diagnosis, Current Treatment, and Prognosis: Patient is ready to transitions to [facility] where younger sister is currently staying long term.  Emotional  Assessment Appearance:  Appears older than stated age Attitude/Demeanor/Rapport:  Other Affect (typically observed):  Pleasant Orientation:  Oriented to Place, Oriented to Self, Oriented to Situation, Oriented to  Time Alcohol / Substance use:  Not Applicable Psych involvement (Current and /or in the community):  No (Comment)  Discharge Needs  Concerns to be addressed:  No discharge needs identified Readmission within the last 30 days:  No Current discharge risk:  None Barriers to Discharge:  No Barriers Identified   Wende Neighbors, LCSW 05/25/2017, 5:15 PM

## 2017-05-25 NOTE — Progress Notes (Signed)
Family Medicine Teaching Service Daily Progress Note Intern Pager: [phone]  Patient name: NAT LOWENTHAL Medical record number: [id number] Date of birth: 06/29/1950 Age: 66 y.o. Gender: male  Primary Care Provider: Octavis Sheeler, Pete Pelt, MD Consultants: None  Code Status: Full   Pt Overview and Major Events to Date:   Assessment and Plan:  DOROTHY LANDGREBE is a 66 y.o. male presenting with SOB . PMH is significant for CAD, Enlarged prostate, HTN, HLD, hx of small bowel obstruction, Prediabetes, HFrEF, Osteoarthritis, Afib, Hx of Rectal cancer   LLL Bronchopneumonia. Afebrile, VSS, good O2 saturations on room air. WBC normal. - Has been on CTX and Azithromycin since 12/15. Will transition to PO Levaquin 750mg  q48hrs due to patient's CrCl (per up-to-date, should use levaquin for CAP in patients with multiple co-morbidities such as cancer, renal disease, CHF) - Ambulate with pulse ox prior to discharge - Legionella urine antigen pending  - Blood cultures- NG x < 24 hours  CKD III: Worsening. Scr 1.48 > 1.64 this morning. Baseline SCr 1.35. - Hold Losartan for today - Repeat BMP in the morning  CAD: Cardiac Cath in 2017 2v CAD with subtotal occlusion of small co-dominant RCA and borderline lesion in moderate-sized OM, Severe, predominantly non-ischemic CM EF 20%. Troponins 0.05, 0.05, 0.06 this admission with EKG RBB + Afib (unchanged from previous) - Continue Coreg   HFrEF: EF 30-35%,G2DD in 2017. Currently at dry weight of 200lb. Good urine output of 1.3L over the last 24 hours. - Continue home toresmide 60 mg in AM and 20 mg PM  - Coreg 25 mg BID  - Holding Losartan for worsening kidney function  AFib: Rate controlled this admission. CHA2DS2 - VASc score of 5 - Continue home Eliquis and Coreg  HTN: BPs within normal limits - Continue Coreg - Holding Losartan  Enlarged prostate: has chronic indwelling foley. - Home Proscar and Flomax   FEN/GI: Heart Healthy Diet  Prophylaxis:  Eliqiuis   Disposition: PT/OT recommending SNF. Supposed to go to [facility] 12/31, may be able to go earlier.  Subjective:  Doing fine this morning. Able to walk all the way down hall yesterday with PT. Only had some mild shortness of breath with that. SOB improving. No fevers or chills.   Objective: Temp:  [97.9 F (36.6 C)-98.5 F (36.9 C)] 97.9 F (36.6 C) (12/27 0630) Pulse Rate:  [78-100] 87 (12/27 0630) BP: (103-137)/(73-80) 137/80 (12/27 0630) SpO2:  [94 %-98 %] 94 % (12/27 0630) Weight:  [203 lb 9.6 oz (92.4 kg)] 203 lb 9.6 oz (92.4 kg) (12/27 0630) Physical Exam: General: Sitting up in bed, in NAD, pleasant Cardiovascular: RRR, no murmur  Respiratory: Crackles in the left lower lung base, lungs otherwise clear, normal WOB  Abdomen: BS+, non-tender, colostomy bag in place  Extremities: Bilateral lower extremity edema 2+   Laboratory: Recent Labs  Lab 05/23/17 1332 05/24/17 0604  WBC 5.5 7.4  HGB 11.4* 12.0*  HCT 37.6* 38.6*  PLT 111* 123*   Recent Labs  Lab 05/23/17 1332 05/24/17 0604  NA 142 139  K 2.7* 3.3*  CL 99* 100*  CO2 33* 25  BUN 36* 32*  CREATININE 1.42* 1.48*  CALCIUM 8.8* 8.8*  PROT 7.4  --   BILITOT 1.5*  --   ALKPHOS 118  --   ALT 10*  --   AST 17  --   GLUCOSE 113* 95    Imaging/Diagnostic Tests: No results found.  Zaion Hreha, Pete Pelt, MD 05/25/2017, 8:47 AM PGY-3,  [place] Intern pager: [phone], text pages welcome

## 2017-05-26 LAB — BASIC METABOLIC PANEL
Anion gap: 10 (ref 5–15)
BUN: 28 mg/dL — AB (ref 6–20)
CALCIUM: 8.8 mg/dL — AB (ref 8.9–10.3)
CO2: 29 mmol/L (ref 22–32)
Chloride: 100 mmol/L — ABNORMAL LOW (ref 101–111)
Creatinine, Ser: 1.78 mg/dL — ABNORMAL HIGH (ref 0.61–1.24)
GFR calc Af Amer: 43 mL/min — ABNORMAL LOW (ref >60)
GFR, EST NON AFRICAN AMERICAN: 37 mL/min — AB (ref >60)
GLUCOSE: 110 mg/dL — AB (ref 65–99)
POTASSIUM: 3.8 mmol/L (ref 3.5–5.1)
Sodium: 139 mmol/L (ref 135–145)

## 2017-05-26 LAB — CBC
HCT: 36.8 % — ABNORMAL LOW (ref 39.0–52.0)
Hemoglobin: 11 g/dL — ABNORMAL LOW (ref 13.0–17.0)
MCH: 25.1 pg — AB (ref 26.0–34.0)
MCHC: 29.9 g/dL — ABNORMAL LOW (ref 30.0–36.0)
MCV: 83.8 fL (ref 78.0–100.0)
PLATELETS: 109 10*3/uL — AB (ref 150–400)
RBC: 4.39 MIL/uL (ref 4.22–5.81)
RDW: 19.1 % — AB (ref 11.5–15.5)
WBC: 5 10*3/uL (ref 4.0–10.5)

## 2017-05-26 MED ORDER — POTASSIUM CHLORIDE CRYS ER 20 MEQ PO TBCR
40.0000 meq | EXTENDED_RELEASE_TABLET | Freq: Every day | ORAL | Status: DC
  Administered 2017-05-26: 40 meq via ORAL

## 2017-05-26 MED ORDER — LEVOFLOXACIN 750 MG PO TABS: 750.0000 mg | ORAL_TABLET | Freq: Every day | ORAL | 0 refills | Status: AC

## 2017-05-26 MED ORDER — TORSEMIDE 20 MG PO TABS: 40.0000 mg | ORAL_TABLET | Freq: Every day | ORAL | 0 refills | Status: DC

## 2017-05-26 MED ORDER — POTASSIUM CHLORIDE CRYS ER 20 MEQ PO TBCR: 40.0000 meq | EXTENDED_RELEASE_TABLET | Freq: Every day | ORAL | 0 refills | Status: DC

## 2017-05-26 NOTE — Progress Notes (Signed)
Patient ready for discharge to home from unit. All personal belongings with patient. Discharge instructions and rx and follow up appts reviewed with patient. Patient reminded of importance of keeping follow up appts. Cab voucher will be provided for transport to home.

## 2017-05-26 NOTE — Clinical Social Work Note (Signed)
Patient cannot afford taxi ride home and has no family to transport him. RN does not believe he is safe to go by bus. Cab voucher filled out and left in room for RN.  CSW signing off.  Patrick [name], [name]

## 2017-05-26 NOTE — Progress Notes (Signed)
Family Medicine Teaching Service Daily Progress Note Intern Pager: [phone]  Patient name: Patrick [name] Medical record number: [id number] Date of birth: 1951-04-26 Age: 66 y.o. Gender: male  Primary Care Provider: Mayo, Pete Pelt, MD Consultants: None  Code Status: Full   Pt Overview and Major Events to Date:   Assessment and Plan:  Patrick [name] is a 66 y.o. male presenting with SOB . PMH is significant for CAD, Enlarged prostate, HTN, HLD, hx of small bowel obstruction, Prediabetes, HFrEF, Osteoarthritis, Afib, Hx of Rectal cancer   LLL Bronchopneumonia. Acute. Improving. Afebrile for past 24hrs, VSS, good O2 saturations on room air. WBC normal. - Cont PO Levaquin 750mg  q48hrs due to patient's CrCl (per up-to-date, should use levaquin for CAP in patients with multiple co-morbidities such as cancer, renal disease, CHF). Will need 7-10 days total. Antibiotics started on 12/25 - Ambulate with pulse ox prior to discharge - Legionella urine antigen pending  - Blood cultures- NG x <48 hours  CKD III: Worsening. Scr 1.48 > 1.64 > 1.78 this morning. Baseline SCr 1.35. Could be due to diuresis. 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 Troponins 0.05, 0.05, 0.06 this admission with EKG RBB + Afib (unchanged from previous) - Continue Coreg   HFrEF: EF 30-35%,G2DD in 2017. Currently at dry weight of 200lb. Good urine output of 1.0L over the last 24 hours. According to scales he is currently at 205lbs.  - Continue home toresmide 60 mg in AM and 20 mg PM  - Coreg 25 mg BID  - Holding Losartan for worsening kidney function  AFib: Rate controlled this admission. CHA2DS2 - VASc score of 5 -  Continue home Eliquis and Coreg  HTN: BPs within normal limits - Continue Coreg - Holding Losartan  Enlarged prostate: has chronic indwelling foley. - Home Proscar and Flomax   FEN/GI: Heart Healthy Diet  Prophylaxis: Eliqiuis   Disposition: PT/OT recommending SNF. Supposed to go to SNF 12/31, wants to go home first, then go to SNF on 31st.  Subjective:  Patient states he is doing well this am. He is still having some mild coughing that is somewhat productive for phlegm. He denies fever but has had some chills. No chest pain, pain on inspiration, nausea, vomiting, or abdominal pain.   Objective: Temp:  [97.5 F (36.4 C)-97.8 F (36.6 C)] 97.8 F (36.6 C) (12/28 0520) Pulse Rate:  [59-64] 64 (12/28 0520) Resp:  [18-20] 18 (12/28 0520) BP: (114-117)/(73-76) 117/73 (12/28 0520) SpO2:  [94 %-95 %] 95 % (12/28 0520) Weight:  [206 lb 9.6 oz (93.7 kg)] 206 lb 9.6 oz (93.7 kg) (12/28 0520) Physical Exam: General: Sitting up in bed, in NAD, pleasant Cardiovascular: RRR, no murmur  Respiratory: normal WOB, decreased breath sounds bilaterally, CTAB Abdomen: BS+, non-tender, colostomy bag in place  Extremities: Bilateral lower extremity edema 1+   Laboratory: Recent Labs  Lab 05/24/17 0604 05/25/17 0922 05/26/17 0648  WBC 7.4 5.3 5.0  HGB 12.0* 10.9* 11.0*  HCT 38.6* 36.9* 36.8*  PLT 123* 114* 109*   Recent Labs  Lab 05/23/17 1332 05/24/17 0604 05/25/17 0922 05/26/17 0648  NA 142 139 138 139  K 2.7* 3.3* 3.7 3.8  CL 99* 100*  95* 100*  CO2 33* 25 31 29   BUN 36* 32* 31* 28*  CREATININE 1.42* 1.48* 1.64* 1.78*  CALCIUM 8.8* 8.8* 8.7* 8.8*  PROT 7.4  --   --   --   BILITOT 1.5*  --   --   --   ALKPHOS 118  --   --   --   ALT 10*  --   --   --   AST 17  --   --   --   GLUCOSE 113* 95 207* 110*    Imaging/Diagnostic Tests: No results found.  Nuala Alpha, DO 05/26/2017, 6:54 AM PGY-1, [place] Intern pager: [phone], text pages  welcome

## 2017-05-26 NOTE — Discharge Instructions (Signed)
You were admitted due to an infection of your left lower lung, aka you had Pneumonia. You have been treated with antibiotics. Please continue to take them as directed for the entire course of the therapy even if you feel better.

## 2017-05-26 NOTE — Progress Notes (Signed)
Physical Therapy Treatment Patient Details Name: Patrick [name] MRN: [id number] DOB: 21-Mar-1951 Today's Date: 05/26/2017    History of Present Illness Pt is a 66 y.o. male admitted 05/23/17 with c/o SOB; CXR concerning for left lower lobe bronchopneumonia. Pertinent PMH includes CAD, HTN, HLD, prediabetes, HF, OA, A-fib, rectal cancer (colostomy bag).    PT Comments    Pt is increasing ambulation distance with less DOE noted. He is able to self monitor activity tolerance. Pt does require cueing for technique with RW and min guard for safety with ambulation. He would benefit from continued skilled PT to increase activity tolerance and functional independence.    Follow Up Recommendations  SNF(PT at [facility])     Equipment Recommendations  None recommended by PT    Recommendations for Other Services       Precautions / Restrictions Precautions Precautions: Fall Precaution Comments: Colostomy Restrictions Weight Bearing Restrictions: No    Mobility  Bed Mobility Overal bed mobility: Modified Independent             General bed mobility comments: Received sitting EOB  Transfers Overall transfer level: Needs assistance Equipment used: Rolling walker (2 wheeled) Transfers: Sit to/from Stand Sit to Stand: Supervision         General transfer comment: supervision for safety  Ambulation/Gait Ambulation/Gait assistance: Min guard Ambulation Distance (Feet): 160 Feet Assistive device: Rolling walker (2 wheeled) Gait Pattern/deviations: Step-through pattern;Decreased stride length;Trunk flexed Gait velocity: Decreased   General Gait Details: VC for upright posture and walker proximity. Pt with good self monitoring, requesting to turn back before becoming too fatigued.    Stairs            Wheelchair Mobility    Modified Rankin (Stroke Patients Only)       Balance Overall balance assessment: Needs assistance Sitting-balance support: No upper extremity  supported;Feet supported Sitting balance-Leahy Scale: Good       Standing balance-Leahy Scale: Fair                              Cognition Arousal/Alertness: Awake/alert Behavior During Therapy: WFL for tasks assessed/performed Overall Cognitive Status: Within Functional Limits for tasks assessed                                        Exercises      General Comments        Pertinent Vitals/Pain Pain Assessment: No/denies pain    Home Living                      Prior Function            PT Goals (current goals can now be found in the care plan section) Acute Rehab PT Goals Patient Stated Goal: Move into [place] on Monday PT Goal Formulation: With patient Time For Goal Achievement: 06/07/17 Potential to Achieve Goals: Good Progress towards PT goals: Progressing toward goals    Frequency    Min 2X/week      PT Plan Current plan remains appropriate    Co-evaluation              AM-PAC PT "6 Clicks" Daily Activity  Outcome Measure  Difficulty turning over in bed (including adjusting bedclothes, sheets and blankets)?: A Little Difficulty moving from lying on back to sitting on the  side of the bed? : A Little Difficulty sitting down on and standing up from a chair with arms (e.g., wheelchair, bedside commode, etc,.)?: A Little Help needed moving to and from a bed to chair (including a wheelchair)?: A Little Help needed walking in hospital room?: A Little Help needed climbing 3-5 steps with a railing? : A Lot 6 Click Score: 17    End of Session Equipment Utilized During Treatment: Gait belt Activity Tolerance: Patient tolerated treatment well Patient left: in chair;with call bell/phone within reach Nurse Communication: Mobility status PT Visit Diagnosis: Other abnormalities of gait and mobility (R26.89);Muscle weakness (generalized) (M62.81)     Time: 4210-3128 PT Time Calculation (min) (ACUTE ONLY): 15  min  Charges:  $Gait Training: 8-22 mins                    G Codes:      Benjiman Core, [place] Pager [phone] Acute Rehab  Allena Katz 05/26/2017, 2:02 PM

## 2017-05-26 NOTE — Plan of Care (Signed)
  Education: Knowledge of General Education information will improve 05/26/2017 0747 - Progressing by Lavonna Rua, RN

## 2017-05-28 LAB — CULTURE, BLOOD (ROUTINE X 2)
Culture: NO GROWTH
Culture: NO GROWTH
SPECIAL REQUESTS: ADEQUATE
Special Requests: ADEQUATE

## 2017-05-29 ENCOUNTER — Encounter: Payer: Self-pay | Admitting: *Deleted

## 2017-05-29 ENCOUNTER — Other Ambulatory Visit: Payer: Self-pay | Admitting: *Deleted

## 2017-05-29 DIAGNOSIS — M6281 Muscle weakness (generalized): Secondary | ICD-10-CM | POA: Diagnosis not present

## 2017-05-29 DIAGNOSIS — D649 Anemia, unspecified: Secondary | ICD-10-CM | POA: Diagnosis not present

## 2017-05-29 DIAGNOSIS — C189 Malignant neoplasm of colon, unspecified: Secondary | ICD-10-CM | POA: Diagnosis not present

## 2017-05-29 DIAGNOSIS — I2510 Atherosclerotic heart disease of native coronary artery without angina pectoris: Secondary | ICD-10-CM | POA: Diagnosis not present

## 2017-05-29 DIAGNOSIS — I4891 Unspecified atrial fibrillation: Secondary | ICD-10-CM | POA: Diagnosis not present

## 2017-05-29 DIAGNOSIS — N183 Chronic kidney disease, stage 3 (moderate): Secondary | ICD-10-CM | POA: Diagnosis not present

## 2017-05-29 DIAGNOSIS — R278 Other lack of coordination: Secondary | ICD-10-CM | POA: Diagnosis not present

## 2017-05-29 DIAGNOSIS — J189 Pneumonia, unspecified organism: Secondary | ICD-10-CM | POA: Diagnosis not present

## 2017-05-29 DIAGNOSIS — I5023 Acute on chronic systolic (congestive) heart failure: Secondary | ICD-10-CM | POA: Diagnosis not present

## 2017-05-29 DIAGNOSIS — I10 Essential (primary) hypertension: Secondary | ICD-10-CM | POA: Diagnosis not present

## 2017-05-29 NOTE — Patient Outreach (Signed)
[facility]) Care Management [place] Telephone Outreach, Case Closure  05/29/2017  Patrick [name] [birth year] [id number]  Successful telephone outreach to Patrick [name], 66 y/o male active with [facility] CCM RN [place]; patient was hospitalized December 25-28, 2018 for CAP with acute-on chronic CHF exacerbation; patient was discharged home May 26, 2017 with previously arranged plans to move to [facility] on today 05/29/17, where his sister currently resides.  Patient has history including, but not limited to, CAD, CHF/ cardiomyopathy, HTN/ HLD, AF, pulmonary HTN, renal cancer/ BPH, CKD stage III, with permanen tlong-term foley catheter.  HIPAA/ identity verified during phone call today.  Explained that I was contacting patient on behalf of previously active [place], [place], whom patient did not remember today; patient reports immediately into phone call that he is currently "on his way to check into SNF, for life."  Patient confirms that "everything is set up at SNF, they are expecting" his arrival this afternoon; explains that he is happy to be going to SNF where his sister, whom he used to live with, currently resides.  Patient confirms today that he has no further care coordination needs, since he is on his way to go to SNF for long-term care.  Discussed with patient that I would close currently open [facility] CM program, which patient verbalizes agreement with.  Plan:  Will close patient's [place] program, as he is now residing at long-term care SNF, and will make patient's PCP and [facility] CMA aware of same.  Oneta Rack, RN, BSN, [employer] [facility] Care Management  [phone]

## 2017-06-05 ENCOUNTER — Inpatient Hospital Stay: Payer: Self-pay | Admitting: Internal Medicine

## 2017-06-06 ENCOUNTER — Encounter: Payer: Self-pay | Admitting: Licensed Clinical Social Worker

## 2017-06-06 NOTE — Progress Notes (Signed)
  LCSW informed by [place] Social Worker that patient has successfully been placed at [facility] for [place].  Casimer Lanius, LCSW Licensed Clinical Social Worker [place]   [phone] 9:18 AM

## 2017-06-07 DIAGNOSIS — I4891 Unspecified atrial fibrillation: Secondary | ICD-10-CM | POA: Diagnosis not present

## 2017-06-07 DIAGNOSIS — I2510 Atherosclerotic heart disease of native coronary artery without angina pectoris: Secondary | ICD-10-CM | POA: Diagnosis not present

## 2017-06-07 DIAGNOSIS — N183 Chronic kidney disease, stage 3 (moderate): Secondary | ICD-10-CM | POA: Diagnosis not present

## 2017-06-07 DIAGNOSIS — J189 Pneumonia, unspecified organism: Secondary | ICD-10-CM | POA: Diagnosis not present

## 2017-06-15 DIAGNOSIS — I2510 Atherosclerotic heart disease of native coronary artery without angina pectoris: Secondary | ICD-10-CM | POA: Diagnosis not present

## 2017-06-15 DIAGNOSIS — J189 Pneumonia, unspecified organism: Secondary | ICD-10-CM | POA: Diagnosis not present

## 2017-06-15 DIAGNOSIS — N183 Chronic kidney disease, stage 3 (moderate): Secondary | ICD-10-CM | POA: Diagnosis not present

## 2017-06-15 DIAGNOSIS — I4891 Unspecified atrial fibrillation: Secondary | ICD-10-CM | POA: Diagnosis not present

## 2017-06-15 DIAGNOSIS — C189 Malignant neoplasm of colon, unspecified: Secondary | ICD-10-CM | POA: Diagnosis not present

## 2017-06-19 ENCOUNTER — Encounter: Payer: Self-pay | Admitting: Physical Therapy

## 2017-06-19 DIAGNOSIS — R338 Other retention of urine: Secondary | ICD-10-CM | POA: Diagnosis not present

## 2017-06-19 NOTE — Therapy (Unsigned)
[facility] [address] [place], [place], [address] Phone: [phone]   Fax:  [phone]  Patient Details  Name: Patrick [name] MRN: [id number] Date of Birth: 10-17-1950 Referring Provider:  Nelma Rothman MD  Encounter Date: 06/19/2017  PHYSICAL THERAPY DISCHARGE SUMMARY  Visits from Start of Care: 4  Current functional level related to goals / functional outcomes: Unable to assess as did not return to PT   Remaining deficits: Unable to assess as did not return to PT   Education / Equipment: Unable to assess as did not return to PT  Plan: Patient agrees to discharge.  Patient goals were not met. Patient is being discharged due to not returning since the last visit.  ?????       Rexanne Mano , PT 06/19/2017, 12:20 PM  [facility] [address] [place], [place], [address] Phone: [phone]   Fax:  [phone]

## 2017-07-06 DIAGNOSIS — I10 Essential (primary) hypertension: Secondary | ICD-10-CM | POA: Diagnosis not present

## 2017-07-06 DIAGNOSIS — I509 Heart failure, unspecified: Secondary | ICD-10-CM | POA: Diagnosis not present

## 2017-07-06 DIAGNOSIS — I4891 Unspecified atrial fibrillation: Secondary | ICD-10-CM | POA: Diagnosis not present

## 2017-07-06 DIAGNOSIS — B351 Tinea unguium: Secondary | ICD-10-CM | POA: Diagnosis not present

## 2017-07-06 DIAGNOSIS — N183 Chronic kidney disease, stage 3 (moderate): Secondary | ICD-10-CM | POA: Diagnosis not present

## 2017-07-12 DIAGNOSIS — Z933 Colostomy status: Secondary | ICD-10-CM | POA: Diagnosis not present

## 2017-07-25 ENCOUNTER — Ambulatory Visit (INDEPENDENT_AMBULATORY_CARE_PROVIDER_SITE_OTHER): Payer: Medicare Other | Admitting: Sports Medicine

## 2017-07-25 ENCOUNTER — Encounter: Payer: Self-pay | Admitting: Sports Medicine

## 2017-07-25 DIAGNOSIS — M79605 Pain in left leg: Secondary | ICD-10-CM

## 2017-07-25 DIAGNOSIS — I739 Peripheral vascular disease, unspecified: Secondary | ICD-10-CM

## 2017-07-25 DIAGNOSIS — M79676 Pain in unspecified toe(s): Secondary | ICD-10-CM | POA: Diagnosis not present

## 2017-07-25 DIAGNOSIS — B351 Tinea unguium: Secondary | ICD-10-CM | POA: Diagnosis not present

## 2017-07-25 MED ORDER — CAPSAICIN-MENTHOL-METHYL SAL 0.025-1-12 % EX CREA: TOPICAL_CREAM | CUTANEOUS | 1 refills | Status: DC

## 2017-07-25 NOTE — Progress Notes (Signed)
Subjective: Patrick [name] is a 67 y.o. male patient seen today in office with complaint of mildly painful thickened and elongated toenails; unable to trim. Patient denies history of Diabetes or  Neuropathy.Admits to issues of swelling and sometimes pain in left lateral thigh. Patient has no other pedal complaints at this time.   Patient Active Problem List   Diagnosis Date Noted  . Community acquired bacterial pneumonia 05/24/2017  . Renal cell carcinoma, left ([place]) 05/24/2017  . Liver cirrhosis ([place]) 05/24/2017  . Pneumonia 05/23/2017  . Balance problem 03/07/2017  . Pulmonary HTN ([place]) 11/10/2016  . History of resection of rectum 09/28/2016  . Rectal cancer ([place]) 09/28/2016  . Atrial fibrillation ([place]) 09/11/2016  . Coronary artery disease involving native coronary artery of native heart with angina pectoris ([place]) 09/11/2016  . Nonischemic cardiomyopathy ([place]) 09/11/2016  . Thrombocytopenia ([place]) 05/29/2016  . Adenocarcinoma of rectum s/p robotic APR/colostomy 09/28/2016 04/14/2016  . Chronic kidney disease (CKD), stage III (moderate) ([place]) 02/05/2016  . Abnormal finding on cardiovascular stress test 02/05/2016  . Cardiomyopathy, ischemic 06/17/2015  . Acute on chronic clinical systolic heart failure ([place]) 06/17/2015  . Major depressive disorder, recurrent episode ([place]) 04/17/2015  . Hypokalemia 08/29/2014  . Other iron deficiency anemias   . Elevated troponin I measurement   . Health care maintenance 03/11/2014  . Obesity, unspecified 10/03/2013  . Dyslipidemia 08/13/2013  . MACULAR DEGENERATION 05/02/2008  . ERECTILE DYSFUNCTION 03/13/2007  . Essential hypertension 01/16/2007  . BPH (benign prostatic hyperplasia) 01/16/2007    Current Outpatient Medications on File Prior to Visit  Medication Sig Dispense Refill  . acetaminophen (TYLENOL) 500 MG tablet Take 1,000 mg by mouth every 6 (six) hours as needed (for pain/fever/headaches.).     Marland Kitchen allopurinol (ZYLOPRIM) 300 MG tablet  take 1 tablet by mouth once daily 90 tablet 0  . apixaban (ELIQUIS) 5 MG TABS tablet Take 1 tablet (5 mg total) by mouth 2 (two) times daily. 60 tablet 11  . Ascorbic Acid (VITAMIN C) 1000 MG tablet Take 2,000 mg by mouth 2 (two) times daily.    Marland Kitchen buPROPion (WELLBUTRIN) 100 MG tablet take 1 tablet by mouth twice a day 120 tablet 0  . carvedilol (COREG) 25 MG tablet take 1 tablets by mouth twice a day 180 tablet 3  . colchicine 0.6 MG tablet Take 0.6 mg by mouth 2 (two) times daily as needed.    . ferrous sulfate 325 (65 FE) MG tablet Take 1 tablet (325 mg total) by mouth 2 (two) times daily with a meal. 180 tablet 0  . finasteride (PROSCAR) 5 MG tablet take 1 tablet by mouth once daily 90 tablet 1  . losartan (COZAAR) 50 MG tablet Take 1 tablet (50 mg total) by mouth daily. 90 tablet 3  . lovastatin (MEVACOR) 20 MG tablet Take 1 tablet (20 mg total) by mouth at bedtime. 90 tablet 1  . methocarbamol (ROBAXIN) 750 MG tablet Take 1 tablet (750 mg total) by mouth 4 (four) times daily as needed (use for muscle cramps/pain). (Patient not taking: Reported on 03/29/2017) 30 tablet 2  . Multiple Vitamins-Minerals (MULTIVITAMIN WITH MINERALS) tablet Take 1 tablet by mouth daily. Men's 50+ Multivitamin    . nitroGLYCERIN (NITROSTAT) 0.4 MG SL tablet place 1 tablet under the tongue if needed every 5 minutes for chest pain for 3 doses IF NO RELIEF AFTER 1ST DOSE CALL 911. 25 tablet 0  . Omega-3 1000 MG CAPS Take 2 capsules (2,000 mg total) by mouth  2 (two) times daily. 180 capsule 1  . oxyCODONE-acetaminophen (PERCOCET/ROXICET) 5-325 MG tablet Take 1 tablet by mouth every 6 (six) hours as needed for severe pain.    . potassium chloride SA (K-DUR,KLOR-CON) 20 MEQ tablet Take 2 tablets (40 mEq total) by mouth daily. And 20 meq by mouth in the evening 30 tablet 0  . tamsulosin (FLOMAX) 0.4 MG CAPS capsule Take 0.4 mg by mouth daily.     Marland Kitchen torsemide (DEMADEX) 20 MG tablet Take 2 tablets (40 mg total) by mouth daily.  TAKE 3 TABLETS (60MG ) IN THE AFTERNOON 30 tablet 0  . traZODone (DESYREL) 100 MG tablet Take 3 tablets (300 mg total) by mouth at bedtime. 90 tablet 2   No current facility-administered medications on file prior to visit.     Allergies  Allergen Reactions  . Nsaids     Kidney disease    Objective: Physical Exam  General: Well developed, nourished, no acute distress, awake, alert and oriented x 3  Vascular: Dorsalis pedis artery 1/4 bilateral, Posterior tibial artery 0/4 bilateral, skin temperature warm to warm proximal to distal bilateral lower extremities, + varicosities with hyperpigmentation, - pedal hair present bilateral.  Neurological: Gross sensation present via light touch bilateral.   Dermatological: Skin is warm, dry, and supple bilateral, Nails 1-10 are tender, long, thick, and discolored with mild subungal debris, no webspace macerations present bilateral, no open lesions present bilateral, no callus/corns/hyperkeratotic tissue present bilateral. No signs of infection bilateral.  Musculoskeletal: Asymptomatic pes planus and hammertoe boney deformities noted bilateral. Muscular strength within normal limits without painon range of motion. No pain with calf compression bilateral. Subjective lateral thigh pain with long walking, uses can to help.   Assessment and Plan:  Problem List Items Addressed This Visit    None    Visit Diagnoses    Dermatophytosis of nail    -  Primary   Pain of toe, unspecified laterality       PVD (peripheral vascular disease) ([place])       Pain of left lower extremity          -Examined patient.  -Discussed treatment options for painful mycotic nails. -Mechanically debrided and reduced mycotic nails with sterile nail nipper and dremel nail file without incident. -Recommend patient to discuss with PCP compression stockings -Rx menthol cream to use to legs as needed for pain continue with cane for stability -Patient to return in 3 months for  follow up evaluation or sooner if symptoms worsen.  Landis Martins, DPM

## 2017-08-09 DIAGNOSIS — R296 Repeated falls: Secondary | ICD-10-CM | POA: Diagnosis not present

## 2017-08-10 DIAGNOSIS — I509 Heart failure, unspecified: Secondary | ICD-10-CM | POA: Diagnosis not present

## 2017-08-10 DIAGNOSIS — I10 Essential (primary) hypertension: Secondary | ICD-10-CM | POA: Diagnosis not present

## 2017-08-10 DIAGNOSIS — I4891 Unspecified atrial fibrillation: Secondary | ICD-10-CM | POA: Diagnosis not present

## 2017-08-10 DIAGNOSIS — Z9049 Acquired absence of other specified parts of digestive tract: Secondary | ICD-10-CM | POA: Diagnosis not present

## 2017-08-10 DIAGNOSIS — R296 Repeated falls: Secondary | ICD-10-CM | POA: Diagnosis not present

## 2017-08-11 DIAGNOSIS — R296 Repeated falls: Secondary | ICD-10-CM | POA: Diagnosis not present

## 2017-08-14 DIAGNOSIS — R296 Repeated falls: Secondary | ICD-10-CM | POA: Diagnosis not present

## 2017-08-15 DIAGNOSIS — R296 Repeated falls: Secondary | ICD-10-CM | POA: Diagnosis not present

## 2017-08-16 DIAGNOSIS — R296 Repeated falls: Secondary | ICD-10-CM | POA: Diagnosis not present

## 2017-08-17 DIAGNOSIS — R296 Repeated falls: Secondary | ICD-10-CM | POA: Diagnosis not present

## 2017-08-17 NOTE — Telephone Encounter (Signed)
Opened in error. Deseree Blount, CMA 

## 2017-08-18 DIAGNOSIS — R296 Repeated falls: Secondary | ICD-10-CM | POA: Diagnosis not present

## 2017-08-21 DIAGNOSIS — R296 Repeated falls: Secondary | ICD-10-CM | POA: Diagnosis not present

## 2017-08-22 DIAGNOSIS — R296 Repeated falls: Secondary | ICD-10-CM | POA: Diagnosis not present

## 2017-08-23 DIAGNOSIS — R296 Repeated falls: Secondary | ICD-10-CM | POA: Diagnosis not present

## 2017-08-24 ENCOUNTER — Other Ambulatory Visit: Payer: Self-pay | Admitting: Urology

## 2017-08-24 DIAGNOSIS — R296 Repeated falls: Secondary | ICD-10-CM | POA: Diagnosis not present

## 2017-08-24 DIAGNOSIS — D3001 Benign neoplasm of right kidney: Secondary | ICD-10-CM

## 2017-08-25 DIAGNOSIS — N390 Urinary tract infection, site not specified: Secondary | ICD-10-CM | POA: Diagnosis not present

## 2017-08-26 DIAGNOSIS — R296 Repeated falls: Secondary | ICD-10-CM | POA: Diagnosis not present

## 2017-08-28 DIAGNOSIS — R296 Repeated falls: Secondary | ICD-10-CM | POA: Diagnosis not present

## 2017-08-28 NOTE — Progress Notes (Signed)
HPI The patient presents for follow-up CAD.  On cardiac cath in Sept 2017 he had subtotal occlusion of the small codominant right coronary. He had a borderline lesion in a moderate size first marginal. He had a severe cardiomyopathy with an ejection fraction of 20%. He did have mild to moderate pulmonary venous hypertension and  normal cardiac output.  His echo in Dec demonstrated his EF to be somewhat improved at 30 - 35%.    In early May 2018 he had resection of his cancer with permanent colostomy.  He was in the hospital again a few weeks later with AKI.  Metolzaone, losartan and torsemide were held.  He had mild troponin elevation.  At discharge the torsemide was restarted.  He was last seen in the office in Sept.  Since then he has been in the hospital again in Dec.  He did have a bump in his creat.  For a short period his Losartan was held.    He is now living in a nursing home.     He returns for follow up.  He reports that he is run in a wheelchair and has decreasing mobility  However.  He is slightly confused today.  However, he is not having any chest pressure, neck or arm discomfort.  Is not having any palpitations, presyncope or syncope.  He is having no new cough fevers or chills.  He has no acute dyspnea.  He just complains about an overall decline and weakness.  He says he is not eating well.  Allergies  Allergen Reactions  . Nsaids     Kidney disease    Current Outpatient Medications  Medication Sig Dispense Refill  . acetaminophen (TYLENOL) 500 MG tablet Take 1,000 mg by mouth every 6 (six) hours as needed (for pain/fever/headaches.).     Marland Kitchen allopurinol (ZYLOPRIM) 300 MG tablet take 1 tablet by mouth once daily 90 tablet 0  . apixaban (ELIQUIS) 5 MG TABS tablet Take 1 tablet (5 mg total) by mouth 2 (two) times daily. 60 tablet 11  . Ascorbic Acid (VITAMIN C) 1000 MG tablet Take 2,000 mg by mouth 2 (two) times daily.    Marland Kitchen buPROPion (WELLBUTRIN) 100 MG tablet take 1 tablet by  mouth twice a day 120 tablet 0  . carvedilol (COREG) 25 MG tablet take 1 tablets by mouth twice a day 180 tablet 3  . colchicine 0.6 MG tablet Take 0.6 mg by mouth 2 (two) times daily as needed.    . ferrous sulfate 325 (65 FE) MG tablet Take 1 tablet (325 mg total) by mouth 2 (two) times daily with a meal. 180 tablet 0  . finasteride (PROSCAR) 5 MG tablet take 1 tablet by mouth once daily 90 tablet 1  . losartan (COZAAR) 50 MG tablet Take 1 tablet (50 mg total) by mouth daily. 90 tablet 3  . methocarbamol (ROBAXIN) 750 MG tablet Take 1 tablet (750 mg total) by mouth 4 (four) times daily as needed (use for muscle cramps/pain). 30 tablet 2  . Multiple Vitamins-Minerals (MULTIVITAMIN WITH MINERALS) tablet Take 1 tablet by mouth daily. Men's 50+ Multivitamin    . nitroGLYCERIN (NITROSTAT) 0.4 MG SL tablet place 1 tablet under the tongue if needed every 5 minutes for chest pain for 3 doses IF NO RELIEF AFTER 1ST DOSE CALL 911. 25 tablet 0  . Omega-3 1000 MG CAPS Take 2 capsules (2,000 mg total) by mouth 2 (two) times daily. 180 capsule 1  . oxyCODONE-acetaminophen (PERCOCET/ROXICET)  5-325 MG tablet Take 1 tablet by mouth every 6 (six) hours as needed for severe pain.    . potassium chloride SA (K-DUR,KLOR-CON) 20 MEQ tablet Take 2 tablets (40 mEq total) by mouth daily. And 20 meq by mouth in the evening 30 tablet 0  . tamsulosin (FLOMAX) 0.4 MG CAPS capsule Take 0.4 mg by mouth daily.     Marland Kitchen torsemide (DEMADEX) 20 MG tablet Take 60 mg by mouth 2 (two) times daily.    . traZODone (DESYREL) 100 MG tablet Take 3 tablets (300 mg total) by mouth at bedtime. 90 tablet 2  . atorvastatin (LIPITOR) 10 MG tablet Take 1 tablet by mouth daily.    . Capsaicin-Menthol-Methyl Sal (CAPSAICIN-METHYL SAL-MENTHOL) 0.025-1-12 % CREA Apply to left and or right leg as needed once to twice a day for pain 56.6 g 1  . clotrimazole-betamethasone (LOTRISONE) cream Apply 1 application topically 2 (two) times daily.    Marland Kitchen lovastatin  (MEVACOR) 20 MG tablet Take 1 tablet (20 mg total) by mouth at bedtime. 90 tablet 1   No current facility-administered medications for this visit.     Past Medical History:  Diagnosis Date  . Arthritis    "right hand; right knee" (06/19/2014)  . CAD (coronary artery disease)    2v CAD with subtotal occlusion of small co-dominant RCA and borderline lesion in moderate-sized OM-1  . CHF (congestive heart failure) (HCC)    Preserved EF  . Coronary artery disease involving native coronary artery of native heart with angina pectoris ([place]) 09/11/2016  . Degenerative joint disease of knee, right   . Dysrhythmia   . Enlarged prostate   . Hyperlipidemia   . Hypertension   . Pneumonia 05/2014  . Prediabetes 10/03/2014  . Scrotal edema 04/03/2015  . SMALL BOWEL OBSTRUCTION, HX OF 08/21/2007   Annotation: with narrowing in the ileocecal region Qualifier: Diagnosis of  By: Hassell Done FNP, Tori Milks      Past Surgical History:  Procedure Laterality Date  . CARDIAC CATHETERIZATION N/A 02/16/2016   Procedure: Right/Left Heart Cath and Coronary Angiography;  Surgeon: Jolaine Artist, MD;  Location: [facility];  Service: Cardiovascular;  Laterality: N/A;  . COLONOSCOPY N/A 04/01/2016   Procedure: COLONOSCOPY;  Surgeon: Leighton Ruff, MD;  Location: [facility] ENDOSCOPY;  Service: Endoscopy;  Laterality: N/A;  . INGUINAL HERNIA REPAIR Left 1990's  . IR GENERIC HISTORICAL  07/05/2016   IR RADIOLOGIST EVAL & MGMT 07/05/2016 Corrie Mckusick, DO [facility] INTERV RAD  . IR RADIOLOGIST EVAL & MGMT  03/29/2017  . XI ROBOT ABDOMINAL PERINEAL RESECTION N/A 09/28/2016   Procedure: XI ROBOT ABDOMINOPERINEAL RESECTION WITH PERMANENT COLOSTOMY ERAS PATHWAY;  Surgeon: Michael Boston, MD;  Location: [facility] ORS;  Service: General;  Laterality: N/A;    ROS:     As stated in the HPI and negative for all other systems.  PHYSICAL EXAM BP 122/73   Pulse 68   Ht 5\' 8"  (1.727 m)   Wt 215 lb (97.5 kg)   SpO2 97%   BMI 32.69 kg/m     PHYSICAL EXAM GEN:  Chronically ill appearing NECK:  No jugular venous distention at 90 degrees, waveform within normal limits, carotid upstroke brisk and symmetric, no bruits, no thyromegaly LYMPHATICS:  No cervical adenopathy LUNGS:  Clear to auscultation bilaterally BACK:  No CVA tenderness CHEST:  Unremarkable HEART:  S1 and S2 within normal limits, no S3, no S4, no clicks, no rubs, no murmurs ABD:  Positive bowel sounds normal in frequency  in pitch, no bruits, no rebound, no guarding, unable to assess midline mass or bruit with the patient seated., colostomy bag. EXT:  2 plus pulses throughout, moderate edema, no cyanosis no clubbing SKIN:  No rashes no nodules, bruising NEURO:  Cranial nerves II through XII grossly intact, motor grossly intact throughout PSYCH:  Cognitively intact, oriented to person place and time, confused.      EKG:  NA   Lab Results  Component Value Date   CREATININE 1.78 (H) 05/26/2017    ASSESSMENT AND PLAN  ATRIAL FIB:      Mr. Patrick [name] has a CHA2DS2 - VASc score of 5.    He has had no symptoms.  He tolerates anticoagulation.  No change in therapy is indicated.   CAD:  The patient has no new sypmtoms.  No further cardiovascular testing is indicated.  We will continue with aggressive risk reduction and meds as listed.  Mild renal insufficiency:     I will defer follow up to his primary provider.  He reports that this is Dr. Deforest Hoyles.    CM:   He seems to be euvolemic.  No change in therapy.   HTN:  The blood pressure is at target.  He will continue meds as listed.

## 2017-08-29 ENCOUNTER — Ambulatory Visit (INDEPENDENT_AMBULATORY_CARE_PROVIDER_SITE_OTHER): Payer: Medicare Other | Admitting: Cardiology

## 2017-08-29 ENCOUNTER — Encounter: Payer: Self-pay | Admitting: Cardiology

## 2017-08-29 VITALS — BP 122/73 | HR 68 | Ht 68.0 in | Wt 215.0 lb

## 2017-08-29 DIAGNOSIS — I482 Chronic atrial fibrillation, unspecified: Secondary | ICD-10-CM

## 2017-08-29 DIAGNOSIS — I25119 Atherosclerotic heart disease of native coronary artery with unspecified angina pectoris: Secondary | ICD-10-CM

## 2017-08-29 DIAGNOSIS — I5022 Chronic systolic (congestive) heart failure: Secondary | ICD-10-CM

## 2017-08-29 NOTE — Patient Instructions (Signed)
Medication Instructions:  Continue current medications  If you need a refill on your cardiac medications before your next appointment, please call your pharmacy.  Labwork: None Ordered  Testing/Procedures: None Ordered  Follow-Up: Your physician wants you to follow-up in: 6 Months with APP. You should receive a reminder letter in the mail two months in advance. If you do not receive a letter, please call our office [phone].     Thank you for choosing [facility] at [facility]!!

## 2017-08-30 DIAGNOSIS — R296 Repeated falls: Secondary | ICD-10-CM | POA: Diagnosis not present

## 2017-08-31 DIAGNOSIS — R296 Repeated falls: Secondary | ICD-10-CM | POA: Diagnosis not present

## 2017-08-31 DIAGNOSIS — R410 Disorientation, unspecified: Secondary | ICD-10-CM | POA: Diagnosis not present

## 2017-08-31 DIAGNOSIS — R197 Diarrhea, unspecified: Secondary | ICD-10-CM | POA: Diagnosis not present

## 2017-08-31 DIAGNOSIS — N390 Urinary tract infection, site not specified: Secondary | ICD-10-CM | POA: Diagnosis not present

## 2017-09-01 DIAGNOSIS — R296 Repeated falls: Secondary | ICD-10-CM | POA: Diagnosis not present

## 2017-09-02 DIAGNOSIS — R296 Repeated falls: Secondary | ICD-10-CM | POA: Diagnosis not present

## 2017-09-04 DIAGNOSIS — R296 Repeated falls: Secondary | ICD-10-CM | POA: Diagnosis not present

## 2017-09-05 ENCOUNTER — Ambulatory Visit
Admission: RE | Admit: 2017-09-05 | Discharge: 2017-09-05 | Disposition: A | Discharge status: Home / Self Care | Payer: Medicare Other | Source: Ambulatory Visit | Attending: Urology | Admitting: Urology

## 2017-09-05 ENCOUNTER — Other Ambulatory Visit: Payer: Self-pay | Admitting: Urology

## 2017-09-05 DIAGNOSIS — R296 Repeated falls: Secondary | ICD-10-CM | POA: Diagnosis not present

## 2017-09-05 DIAGNOSIS — D3001 Benign neoplasm of right kidney: Secondary | ICD-10-CM

## 2017-09-05 DIAGNOSIS — N2889 Other specified disorders of kidney and ureter: Secondary | ICD-10-CM | POA: Diagnosis not present

## 2017-09-06 DIAGNOSIS — R296 Repeated falls: Secondary | ICD-10-CM | POA: Diagnosis not present

## 2017-09-07 DIAGNOSIS — I4891 Unspecified atrial fibrillation: Secondary | ICD-10-CM | POA: Diagnosis not present

## 2017-09-07 DIAGNOSIS — I639 Cerebral infarction, unspecified: Secondary | ICD-10-CM | POA: Diagnosis not present

## 2017-09-07 DIAGNOSIS — R296 Repeated falls: Secondary | ICD-10-CM | POA: Diagnosis not present

## 2017-09-07 DIAGNOSIS — I10 Essential (primary) hypertension: Secondary | ICD-10-CM | POA: Diagnosis not present

## 2017-09-07 DIAGNOSIS — N183 Chronic kidney disease, stage 3 (moderate): Secondary | ICD-10-CM | POA: Diagnosis not present

## 2017-09-08 DIAGNOSIS — R296 Repeated falls: Secondary | ICD-10-CM | POA: Diagnosis not present

## 2017-09-11 DIAGNOSIS — R296 Repeated falls: Secondary | ICD-10-CM | POA: Diagnosis not present

## 2017-09-12 DIAGNOSIS — R296 Repeated falls: Secondary | ICD-10-CM | POA: Diagnosis not present

## 2017-09-13 DIAGNOSIS — R296 Repeated falls: Secondary | ICD-10-CM | POA: Diagnosis not present

## 2017-09-14 DIAGNOSIS — R296 Repeated falls: Secondary | ICD-10-CM | POA: Diagnosis not present

## 2017-09-15 DIAGNOSIS — R296 Repeated falls: Secondary | ICD-10-CM | POA: Diagnosis not present

## 2017-09-18 ENCOUNTER — Other Ambulatory Visit: Payer: Self-pay | Admitting: Interventional Radiology

## 2017-09-18 DIAGNOSIS — R296 Repeated falls: Secondary | ICD-10-CM | POA: Diagnosis not present

## 2017-09-18 DIAGNOSIS — N2889 Other specified disorders of kidney and ureter: Secondary | ICD-10-CM

## 2017-09-19 DIAGNOSIS — R296 Repeated falls: Secondary | ICD-10-CM | POA: Diagnosis not present

## 2017-09-19 DIAGNOSIS — D3000 Benign neoplasm of unspecified kidney: Secondary | ICD-10-CM | POA: Diagnosis not present

## 2017-09-19 DIAGNOSIS — R338 Other retention of urine: Secondary | ICD-10-CM | POA: Diagnosis not present

## 2017-09-19 DIAGNOSIS — N130 Hydronephrosis with ureteropelvic junction obstruction: Secondary | ICD-10-CM | POA: Diagnosis not present

## 2017-09-20 DIAGNOSIS — R296 Repeated falls: Secondary | ICD-10-CM | POA: Diagnosis not present

## 2017-09-21 DIAGNOSIS — R296 Repeated falls: Secondary | ICD-10-CM | POA: Diagnosis not present

## 2017-09-22 DIAGNOSIS — R296 Repeated falls: Secondary | ICD-10-CM | POA: Diagnosis not present

## 2017-09-25 DIAGNOSIS — R296 Repeated falls: Secondary | ICD-10-CM | POA: Diagnosis not present

## 2017-09-26 DIAGNOSIS — R296 Repeated falls: Secondary | ICD-10-CM | POA: Diagnosis not present

## 2017-09-27 DIAGNOSIS — R1314 Dysphagia, pharyngoesophageal phase: Secondary | ICD-10-CM | POA: Diagnosis not present

## 2017-09-27 DIAGNOSIS — R296 Repeated falls: Secondary | ICD-10-CM | POA: Diagnosis not present

## 2017-09-28 DIAGNOSIS — R296 Repeated falls: Secondary | ICD-10-CM | POA: Diagnosis not present

## 2017-09-28 DIAGNOSIS — R1314 Dysphagia, pharyngoesophageal phase: Secondary | ICD-10-CM | POA: Diagnosis not present

## 2017-09-29 DIAGNOSIS — R1314 Dysphagia, pharyngoesophageal phase: Secondary | ICD-10-CM | POA: Diagnosis not present

## 2017-09-29 DIAGNOSIS — R296 Repeated falls: Secondary | ICD-10-CM | POA: Diagnosis not present

## 2017-10-02 DIAGNOSIS — R296 Repeated falls: Secondary | ICD-10-CM | POA: Diagnosis not present

## 2017-10-02 DIAGNOSIS — R1314 Dysphagia, pharyngoesophageal phase: Secondary | ICD-10-CM | POA: Diagnosis not present

## 2017-10-03 ENCOUNTER — Encounter: Payer: Self-pay | Admitting: Radiology

## 2017-10-03 DIAGNOSIS — R1314 Dysphagia, pharyngoesophageal phase: Secondary | ICD-10-CM | POA: Diagnosis not present

## 2017-10-03 DIAGNOSIS — R296 Repeated falls: Secondary | ICD-10-CM | POA: Diagnosis not present

## 2017-10-04 DIAGNOSIS — R1314 Dysphagia, pharyngoesophageal phase: Secondary | ICD-10-CM | POA: Diagnosis not present

## 2017-10-04 DIAGNOSIS — R296 Repeated falls: Secondary | ICD-10-CM | POA: Diagnosis not present

## 2017-10-04 DIAGNOSIS — N390 Urinary tract infection, site not specified: Secondary | ICD-10-CM | POA: Diagnosis not present

## 2017-10-05 DIAGNOSIS — R296 Repeated falls: Secondary | ICD-10-CM | POA: Diagnosis not present

## 2017-10-05 DIAGNOSIS — I10 Essential (primary) hypertension: Secondary | ICD-10-CM | POA: Diagnosis not present

## 2017-10-05 DIAGNOSIS — Z9049 Acquired absence of other specified parts of digestive tract: Secondary | ICD-10-CM | POA: Diagnosis not present

## 2017-10-05 DIAGNOSIS — C189 Malignant neoplasm of colon, unspecified: Secondary | ICD-10-CM | POA: Diagnosis not present

## 2017-10-05 DIAGNOSIS — R627 Adult failure to thrive: Secondary | ICD-10-CM | POA: Diagnosis not present

## 2017-10-05 DIAGNOSIS — R1314 Dysphagia, pharyngoesophageal phase: Secondary | ICD-10-CM | POA: Diagnosis not present

## 2017-10-05 DIAGNOSIS — C649 Malignant neoplasm of unspecified kidney, except renal pelvis: Secondary | ICD-10-CM | POA: Diagnosis not present

## 2017-10-06 ENCOUNTER — Encounter (HOSPITAL_COMMUNITY): Payer: Self-pay | Admitting: Emergency Medicine

## 2017-10-06 ENCOUNTER — Emergency Department (HOSPITAL_COMMUNITY): Payer: Medicare Other

## 2017-10-06 ENCOUNTER — Inpatient Hospital Stay (HOSPITAL_COMMUNITY): Payer: Medicare Other

## 2017-10-06 ENCOUNTER — Inpatient Hospital Stay (HOSPITAL_COMMUNITY)
Admission: EM | Admit: 2017-10-06 | Discharge: 2017-10-27 | DRG: 871 | Disposition: A | Discharge status: Skilled Nursing Facility | Payer: Medicare Other | Attending: Internal Medicine | Admitting: Internal Medicine

## 2017-10-06 DIAGNOSIS — I4891 Unspecified atrial fibrillation: Secondary | ICD-10-CM | POA: Diagnosis not present

## 2017-10-06 DIAGNOSIS — N185 Chronic kidney disease, stage 5: Secondary | ICD-10-CM | POA: Diagnosis not present

## 2017-10-06 DIAGNOSIS — E874 Mixed disorder of acid-base balance: Secondary | ICD-10-CM | POA: Diagnosis present

## 2017-10-06 DIAGNOSIS — N170 Acute kidney failure with tubular necrosis: Secondary | ICD-10-CM | POA: Diagnosis not present

## 2017-10-06 DIAGNOSIS — K922 Gastrointestinal hemorrhage, unspecified: Secondary | ICD-10-CM | POA: Diagnosis not present

## 2017-10-06 DIAGNOSIS — K921 Melena: Secondary | ICD-10-CM | POA: Diagnosis not present

## 2017-10-06 DIAGNOSIS — J988 Other specified respiratory disorders: Secondary | ICD-10-CM

## 2017-10-06 DIAGNOSIS — G9340 Encephalopathy, unspecified: Secondary | ICD-10-CM

## 2017-10-06 DIAGNOSIS — R570 Cardiogenic shock: Secondary | ICD-10-CM | POA: Diagnosis present

## 2017-10-06 DIAGNOSIS — J90 Pleural effusion, not elsewhere classified: Secondary | ICD-10-CM | POA: Diagnosis not present

## 2017-10-06 DIAGNOSIS — A4159 Other Gram-negative sepsis: Secondary | ICD-10-CM | POA: Diagnosis not present

## 2017-10-06 DIAGNOSIS — Z4659 Encounter for fitting and adjustment of other gastrointestinal appliance and device: Secondary | ICD-10-CM

## 2017-10-06 DIAGNOSIS — J9601 Acute respiratory failure with hypoxia: Secondary | ICD-10-CM | POA: Diagnosis not present

## 2017-10-06 DIAGNOSIS — Z4901 Encounter for fitting and adjustment of extracorporeal dialysis catheter: Secondary | ICD-10-CM | POA: Diagnosis not present

## 2017-10-06 DIAGNOSIS — Z4682 Encounter for fitting and adjustment of non-vascular catheter: Secondary | ICD-10-CM | POA: Diagnosis not present

## 2017-10-06 DIAGNOSIS — K311 Adult hypertrophic pyloric stenosis: Secondary | ICD-10-CM

## 2017-10-06 DIAGNOSIS — B9689 Other specified bacterial agents as the cause of diseases classified elsewhere: Secondary | ICD-10-CM | POA: Diagnosis present

## 2017-10-06 DIAGNOSIS — R6521 Severe sepsis with septic shock: Secondary | ICD-10-CM | POA: Diagnosis not present

## 2017-10-06 DIAGNOSIS — N179 Acute kidney failure, unspecified: Secondary | ICD-10-CM | POA: Diagnosis not present

## 2017-10-06 DIAGNOSIS — I130 Hypertensive heart and chronic kidney disease with heart failure and stage 1 through stage 4 chronic kidney disease, or unspecified chronic kidney disease: Secondary | ICD-10-CM | POA: Diagnosis not present

## 2017-10-06 DIAGNOSIS — I5022 Chronic systolic (congestive) heart failure: Secondary | ICD-10-CM | POA: Diagnosis not present

## 2017-10-06 DIAGNOSIS — N189 Chronic kidney disease, unspecified: Secondary | ICD-10-CM

## 2017-10-06 DIAGNOSIS — I509 Heart failure, unspecified: Secondary | ICD-10-CM

## 2017-10-06 DIAGNOSIS — M7989 Other specified soft tissue disorders: Secondary | ICD-10-CM | POA: Diagnosis not present

## 2017-10-06 DIAGNOSIS — Z862 Personal history of diseases of the blood and blood-forming organs and certain disorders involving the immune mechanism: Secondary | ICD-10-CM | POA: Diagnosis not present

## 2017-10-06 DIAGNOSIS — Z452 Encounter for adjustment and management of vascular access device: Secondary | ICD-10-CM

## 2017-10-06 DIAGNOSIS — R2689 Other abnormalities of gait and mobility: Secondary | ICD-10-CM | POA: Diagnosis not present

## 2017-10-06 DIAGNOSIS — J9690 Respiratory failure, unspecified, unspecified whether with hypoxia or hypercapnia: Secondary | ICD-10-CM | POA: Diagnosis not present

## 2017-10-06 DIAGNOSIS — Z79899 Other long term (current) drug therapy: Secondary | ICD-10-CM

## 2017-10-06 DIAGNOSIS — Z992 Dependence on renal dialysis: Secondary | ICD-10-CM

## 2017-10-06 DIAGNOSIS — N289 Disorder of kidney and ureter, unspecified: Secondary | ICD-10-CM | POA: Insufficient documentation

## 2017-10-06 DIAGNOSIS — I255 Ischemic cardiomyopathy: Secondary | ICD-10-CM | POA: Diagnosis not present

## 2017-10-06 DIAGNOSIS — A419 Sepsis, unspecified organism: Secondary | ICD-10-CM | POA: Diagnosis not present

## 2017-10-06 DIAGNOSIS — G9341 Metabolic encephalopathy: Secondary | ICD-10-CM | POA: Diagnosis present

## 2017-10-06 DIAGNOSIS — Z933 Colostomy status: Secondary | ICD-10-CM

## 2017-10-06 DIAGNOSIS — E875 Hyperkalemia: Secondary | ICD-10-CM | POA: Diagnosis not present

## 2017-10-06 DIAGNOSIS — R293 Abnormal posture: Secondary | ICD-10-CM | POA: Diagnosis not present

## 2017-10-06 DIAGNOSIS — M1812 Unilateral primary osteoarthritis of first carpometacarpal joint, left hand: Secondary | ICD-10-CM | POA: Diagnosis not present

## 2017-10-06 DIAGNOSIS — N390 Urinary tract infection, site not specified: Secondary | ICD-10-CM | POA: Diagnosis present

## 2017-10-06 DIAGNOSIS — Z993 Dependence on wheelchair: Secondary | ICD-10-CM | POA: Diagnosis not present

## 2017-10-06 DIAGNOSIS — Z978 Presence of other specified devices: Secondary | ICD-10-CM

## 2017-10-06 DIAGNOSIS — N183 Chronic kidney disease, stage 3 (moderate): Secondary | ICD-10-CM | POA: Diagnosis not present

## 2017-10-06 DIAGNOSIS — N186 End stage renal disease: Secondary | ICD-10-CM | POA: Diagnosis not present

## 2017-10-06 DIAGNOSIS — R1314 Dysphagia, pharyngoesophageal phase: Secondary | ICD-10-CM | POA: Diagnosis not present

## 2017-10-06 DIAGNOSIS — Z85038 Personal history of other malignant neoplasm of large intestine: Secondary | ICD-10-CM

## 2017-10-06 DIAGNOSIS — R488 Other symbolic dysfunctions: Secondary | ICD-10-CM | POA: Diagnosis not present

## 2017-10-06 DIAGNOSIS — E162 Hypoglycemia, unspecified: Secondary | ICD-10-CM | POA: Diagnosis present

## 2017-10-06 DIAGNOSIS — Z7901 Long term (current) use of anticoagulants: Secondary | ICD-10-CM

## 2017-10-06 DIAGNOSIS — R579 Shock, unspecified: Secondary | ICD-10-CM | POA: Diagnosis not present

## 2017-10-06 DIAGNOSIS — I959 Hypotension, unspecified: Secondary | ICD-10-CM | POA: Diagnosis not present

## 2017-10-06 DIAGNOSIS — I340 Nonrheumatic mitral (valve) insufficiency: Secondary | ICD-10-CM | POA: Diagnosis not present

## 2017-10-06 DIAGNOSIS — E872 Acidosis: Secondary | ICD-10-CM | POA: Diagnosis not present

## 2017-10-06 DIAGNOSIS — R0602 Shortness of breath: Secondary | ICD-10-CM | POA: Diagnosis not present

## 2017-10-06 DIAGNOSIS — K8020 Calculus of gallbladder without cholecystitis without obstruction: Secondary | ICD-10-CM | POA: Diagnosis not present

## 2017-10-06 DIAGNOSIS — G2581 Restless legs syndrome: Secondary | ICD-10-CM | POA: Diagnosis not present

## 2017-10-06 DIAGNOSIS — D696 Thrombocytopenia, unspecified: Secondary | ICD-10-CM | POA: Diagnosis present

## 2017-10-06 DIAGNOSIS — R571 Hypovolemic shock: Secondary | ICD-10-CM | POA: Diagnosis not present

## 2017-10-06 DIAGNOSIS — D6959 Other secondary thrombocytopenia: Secondary | ICD-10-CM | POA: Diagnosis not present

## 2017-10-06 DIAGNOSIS — Z743 Need for continuous supervision: Secondary | ICD-10-CM | POA: Diagnosis not present

## 2017-10-06 DIAGNOSIS — N400 Enlarged prostate without lower urinary tract symptoms: Secondary | ICD-10-CM | POA: Diagnosis present

## 2017-10-06 DIAGNOSIS — N184 Chronic kidney disease, stage 4 (severe): Secondary | ICD-10-CM | POA: Diagnosis present

## 2017-10-06 DIAGNOSIS — D631 Anemia in chronic kidney disease: Secondary | ICD-10-CM | POA: Diagnosis present

## 2017-10-06 DIAGNOSIS — M19032 Primary osteoarthritis, left wrist: Secondary | ICD-10-CM | POA: Diagnosis present

## 2017-10-06 DIAGNOSIS — E785 Hyperlipidemia, unspecified: Secondary | ICD-10-CM | POA: Diagnosis present

## 2017-10-06 DIAGNOSIS — J9811 Atelectasis: Secondary | ICD-10-CM | POA: Diagnosis not present

## 2017-10-06 DIAGNOSIS — D638 Anemia in other chronic diseases classified elsewhere: Secondary | ICD-10-CM | POA: Diagnosis not present

## 2017-10-06 DIAGNOSIS — E869 Volume depletion, unspecified: Secondary | ICD-10-CM | POA: Diagnosis not present

## 2017-10-06 DIAGNOSIS — I480 Paroxysmal atrial fibrillation: Secondary | ICD-10-CM | POA: Diagnosis present

## 2017-10-06 DIAGNOSIS — R402441 Other coma, without documented Glasgow coma scale score, or with partial score reported, in the field [EMT or ambulance]: Secondary | ICD-10-CM | POA: Diagnosis not present

## 2017-10-06 DIAGNOSIS — R278 Other lack of coordination: Secondary | ICD-10-CM | POA: Diagnosis not present

## 2017-10-06 DIAGNOSIS — R451 Restlessness and agitation: Secondary | ICD-10-CM | POA: Diagnosis not present

## 2017-10-06 DIAGNOSIS — M79609 Pain in unspecified limb: Secondary | ICD-10-CM | POA: Diagnosis not present

## 2017-10-06 DIAGNOSIS — I2510 Atherosclerotic heart disease of native coronary artery without angina pectoris: Secondary | ICD-10-CM | POA: Diagnosis present

## 2017-10-06 DIAGNOSIS — D689 Coagulation defect, unspecified: Secondary | ICD-10-CM | POA: Diagnosis not present

## 2017-10-06 DIAGNOSIS — R2681 Unsteadiness on feet: Secondary | ICD-10-CM | POA: Diagnosis not present

## 2017-10-06 DIAGNOSIS — Z87891 Personal history of nicotine dependence: Secondary | ICD-10-CM

## 2017-10-06 DIAGNOSIS — R918 Other nonspecific abnormal finding of lung field: Secondary | ICD-10-CM | POA: Diagnosis not present

## 2017-10-06 DIAGNOSIS — M6281 Muscle weakness (generalized): Secondary | ICD-10-CM | POA: Diagnosis not present

## 2017-10-06 DIAGNOSIS — D649 Anemia, unspecified: Secondary | ICD-10-CM | POA: Diagnosis not present

## 2017-10-06 DIAGNOSIS — Z9049 Acquired absence of other specified parts of digestive tract: Secondary | ICD-10-CM

## 2017-10-06 DIAGNOSIS — R279 Unspecified lack of coordination: Secondary | ICD-10-CM | POA: Diagnosis not present

## 2017-10-06 DIAGNOSIS — R52 Pain, unspecified: Secondary | ICD-10-CM

## 2017-10-06 LAB — DIC (DISSEMINATED INTRAVASCULAR COAGULATION)PANEL
D-Dimer, Quant: 1.84 ug/mL-FEU — ABNORMAL HIGH (ref 0.00–0.50)
Fibrinogen: 356 mg/dL (ref 210–475)
INR: 2.17
Platelets: 52 10*3/uL — ABNORMAL LOW (ref 150–400)
Prothrombin Time: 24 seconds — ABNORMAL HIGH (ref 11.4–15.2)

## 2017-10-06 LAB — COMPREHENSIVE METABOLIC PANEL
ALBUMIN: 2.6 g/dL — AB (ref 3.5–5.0)
ALBUMIN: 2.3 g/dL — AB (ref 3.5–5.0)
ALBUMIN: 2.5 g/dL — AB (ref 3.5–5.0)
ALK PHOS: 94 U/L (ref 38–126)
ALT: 12 U/L — ABNORMAL LOW (ref 17–63)
ALT: 11 U/L — AB (ref 17–63)
ALT: 12 U/L — ABNORMAL LOW (ref 17–63)
ANION GAP: 18 — AB (ref 5–15)
ANION GAP: 17 — AB (ref 5–15)
AST: 9 U/L — ABNORMAL LOW (ref 15–41)
AST: 11 U/L — AB (ref 15–41)
AST: 9 U/L — ABNORMAL LOW (ref 15–41)
Alkaline Phosphatase: 100 U/L (ref 38–126)
Alkaline Phosphatase: 94 U/L (ref 38–126)
BILIRUBIN TOTAL: 1.5 mg/dL — AB (ref 0.3–1.2)
BUN: 143 mg/dL — ABNORMAL HIGH (ref 6–20)
BUN: 135 mg/dL — ABNORMAL HIGH (ref 6–20)
BUN: 139 mg/dL — AB (ref 6–20)
CHLORIDE: 109 mmol/L (ref 101–111)
CO2: 10 mmol/L — ABNORMAL LOW (ref 22–32)
CO2: 9 mmol/L — ABNORMAL LOW (ref 22–32)
CO2: <7 mmol/L — ABNORMAL LOW (ref 22–32)
CREATININE: 13.82 mg/dL — AB (ref 0.61–1.24)
Calcium: 8.2 mg/dL — ABNORMAL LOW (ref 8.9–10.3)
Calcium: 7.7 mg/dL — ABNORMAL LOW (ref 8.9–10.3)
Calcium: 7.8 mg/dL — ABNORMAL LOW (ref 8.9–10.3)
Chloride: 111 mmol/L (ref 101–111)
Chloride: 111 mmol/L (ref 101–111)
Creatinine, Ser: 14.42 mg/dL — ABNORMAL HIGH (ref 0.61–1.24)
Creatinine, Ser: 13.6 mg/dL — ABNORMAL HIGH (ref 0.61–1.24)
GFR calc Af Amer: 3 mL/min — ABNORMAL LOW (ref >60)
GFR calc Af Amer: 4 mL/min — ABNORMAL LOW (ref >60)
GFR calc non Af Amer: 3 mL/min — ABNORMAL LOW (ref >60)
GFR, EST AFRICAN AMERICAN: 4 mL/min — AB (ref >60)
GFR, EST NON AFRICAN AMERICAN: 3 mL/min — AB (ref >60)
GFR, EST NON AFRICAN AMERICAN: 3 mL/min — AB (ref >60)
GLUCOSE: 83 mg/dL (ref 65–99)
GLUCOSE: 103 mg/dL — AB (ref 65–99)
Glucose, Bld: 99 mg/dL (ref 65–99)
POTASSIUM: 5.7 mmol/L — AB (ref 3.5–5.1)
POTASSIUM: 5.7 mmol/L — AB (ref 3.5–5.1)
Potassium: 5.7 mmol/L — ABNORMAL HIGH (ref 3.5–5.1)
SODIUM: 136 mmol/L (ref 135–145)
Sodium: 137 mmol/L (ref 135–145)
Sodium: 137 mmol/L (ref 135–145)
TOTAL PROTEIN: 6.9 g/dL (ref 6.5–8.1)
TOTAL PROTEIN: 6.5 g/dL (ref 6.5–8.1)
Total Bilirubin: 1.6 mg/dL — ABNORMAL HIGH (ref 0.3–1.2)
Total Bilirubin: 1.8 mg/dL — ABNORMAL HIGH (ref 0.3–1.2)
Total Protein: 6.6 g/dL (ref 6.5–8.1)

## 2017-10-06 LAB — CBC
HEMATOCRIT: 24.6 % — AB (ref 39.0–52.0)
HEMOGLOBIN: 8.2 g/dL — AB (ref 13.0–17.0)
MCH: 27.2 pg (ref 26.0–34.0)
MCHC: 33.3 g/dL (ref 30.0–36.0)
MCV: 81.7 fL (ref 78.0–100.0)
Platelets: 47 10*3/uL — ABNORMAL LOW (ref 150–400)
RBC: 3.01 MIL/uL — ABNORMAL LOW (ref 4.22–5.81)
RDW: 21.3 % — ABNORMAL HIGH (ref 11.5–15.5)
WBC: 8.1 10*3/uL (ref 4.0–10.5)

## 2017-10-06 LAB — POCT I-STAT 3, ART BLOOD GAS (G3+)
ACID-BASE DEFICIT: 20 mmol/L — AB (ref 0.0–2.0)
ACID-BASE DEFICIT: 18 mmol/L — AB (ref 0.0–2.0)
BICARBONATE: 8.3 mmol/L — AB (ref 20.0–28.0)
BICARBONATE: 10.1 mmol/L — AB (ref 20.0–28.0)
O2 SAT: 76 %
O2 Saturation: 100 %
PH ART: 7.127 — AB (ref 7.350–7.450)
PH ART: 7.125 — AB (ref 7.350–7.450)
TCO2: 9 mmol/L — ABNORMAL LOW (ref 22–32)
TCO2: 11 mmol/L — ABNORMAL LOW (ref 22–32)
pCO2 arterial: 24.5 mmHg — ABNORMAL LOW (ref 32.0–48.0)
pCO2 arterial: 30.7 mmHg — ABNORMAL LOW (ref 32.0–48.0)
pO2, Arterial: 345 mmHg — ABNORMAL HIGH (ref 83.0–108.0)
pO2, Arterial: 53 mmHg — ABNORMAL LOW (ref 83.0–108.0)

## 2017-10-06 LAB — TROPONIN I: Troponin I: 0.33 ng/mL (ref <0.03)

## 2017-10-06 LAB — I-STAT CG4 LACTIC ACID, ED: LACTIC ACID, VENOUS: 0.3 mmol/L — AB (ref 0.5–1.9)

## 2017-10-06 LAB — CBC WITH DIFFERENTIAL/PLATELET
BASOS ABS: 0 10*3/uL (ref 0.0–0.1)
Basophils Relative: 0 %
EOS ABS: 0 10*3/uL (ref 0.0–0.7)
Eosinophils Relative: 1 %
HCT: 23.4 % — ABNORMAL LOW (ref 39.0–52.0)
Hemoglobin: 7.7 g/dL — ABNORMAL LOW (ref 13.0–17.0)
LYMPHS PCT: 7 %
Lymphs Abs: 0.3 10*3/uL — ABNORMAL LOW (ref 0.7–4.0)
MCH: 27.1 pg (ref 26.0–34.0)
MCHC: 32.9 g/dL (ref 30.0–36.0)
MCV: 82.4 fL (ref 78.0–100.0)
Monocytes Absolute: 0.2 10*3/uL (ref 0.1–1.0)
Monocytes Relative: 5 %
NEUTROS PCT: 87 %
Neutro Abs: 3.7 10*3/uL (ref 1.7–7.7)
Platelets: 32 10*3/uL — ABNORMAL LOW (ref 150–400)
RBC: 2.84 MIL/uL — ABNORMAL LOW (ref 4.22–5.81)
RDW: 21.9 % — ABNORMAL HIGH (ref 11.5–15.5)
WBC: 4.2 10*3/uL (ref 4.0–10.5)

## 2017-10-06 LAB — URINALYSIS, ROUTINE W REFLEX MICROSCOPIC

## 2017-10-06 LAB — LACTATE DEHYDROGENASE: LDH: 130 U/L (ref 98–192)

## 2017-10-06 LAB — BLOOD GAS, ARTERIAL
ACID-BASE DEFICIT: 19.9 mmol/L — AB (ref 0.0–2.0)
Bicarbonate: 8.1 mmol/L — ABNORMAL LOW (ref 20.0–28.0)
DRAWN BY: 419771
O2 CONTENT: 3 L/min
O2 SAT: 96.6 %
PATIENT TEMPERATURE: 98.6
PCO2 ART: 28.6 mmHg — AB (ref 32.0–48.0)
pH, Arterial: 7.08 — CL (ref 7.350–7.450)
pO2, Arterial: 114 mmHg — ABNORMAL HIGH (ref 83.0–108.0)

## 2017-10-06 LAB — GLUCOSE, CAPILLARY
Glucose-Capillary: 91 mg/dL (ref 65–99)
Glucose-Capillary: 97 mg/dL (ref 65–99)

## 2017-10-06 LAB — PREPARE RBC (CROSSMATCH)

## 2017-10-06 LAB — URINALYSIS, MICROSCOPIC (REFLEX)
RBC / HPF: >50 RBC/hpf (ref 0–5)
Squamous Epithelial / LPF: NONE SEEN (ref 0–5)

## 2017-10-06 LAB — PHOSPHORUS: Phosphorus: 11.2 mg/dL — ABNORMAL HIGH (ref 2.5–4.6)

## 2017-10-06 LAB — DIC (DISSEMINATED INTRAVASCULAR COAGULATION) PANEL
APTT: 53 s — AB (ref 24–36)
SMEAR REVIEW: NONE SEEN

## 2017-10-06 LAB — PROTIME-INR
INR: 2.28
PROTHROMBIN TIME: 25 s — AB (ref 11.4–15.2)

## 2017-10-06 LAB — PROCALCITONIN: Procalcitonin: 0.27 ng/mL

## 2017-10-06 LAB — CBG MONITORING, ED: Glucose-Capillary: 67 mg/dL (ref 65–99)

## 2017-10-06 LAB — ABO/RH: ABO/RH(D): O NEG

## 2017-10-06 LAB — MAGNESIUM: MAGNESIUM: 1.7 mg/dL (ref 1.7–2.4)

## 2017-10-06 LAB — POC OCCULT BLOOD, ED: Fecal Occult Bld: POSITIVE — AB

## 2017-10-06 MED ORDER — MIDAZOLAM HCL 2 MG/2ML IJ SOLN
2.0000 mg | Freq: Once | INTRAMUSCULAR | Status: AC
  Administered 2017-10-06: 2 mg via INTRAVENOUS

## 2017-10-06 MED ORDER — FENTANYL CITRATE (PF) 100 MCG/2ML IJ SOLN
100.0000 ug | Freq: Once | INTRAMUSCULAR | Status: AC
Start: 2017-10-06 — End: 2017-10-06
  Administered 2017-10-06: 100 ug via INTRAVENOUS

## 2017-10-06 MED ORDER — MIDAZOLAM HCL 2 MG/2ML IJ SOLN: 1.0000 mg | INTRAMUSCULAR | Status: DC | PRN

## 2017-10-06 MED ORDER — VASOPRESSIN 20 UNIT/ML IV SOLN
0.0300 [IU]/min | INTRAVENOUS | Status: DC
  Filled 2017-10-06: qty 2

## 2017-10-06 MED ORDER — SODIUM BICARBONATE 8.4 % IV SOLN
INTRAVENOUS | Status: AC
  Administered 2017-10-06: 50 meq
  Filled 2017-10-06: qty 50

## 2017-10-06 MED ORDER — ORAL CARE MOUTH RINSE
15.0000 mL | Freq: Four times a day (QID) | OROMUCOSAL | Status: DC
  Administered 2017-10-07 – 2017-10-10 (×16): 15 mL via OROMUCOSAL

## 2017-10-06 MED ORDER — PRISMASOL BGK 4/2.5 32-4-2.5 MEQ/L IV SOLN
INTRAVENOUS | Status: DC
  Administered 2017-10-06 – 2017-10-09 (×20): via INTRAVENOUS_CENTRAL
  Filled 2017-10-06 (×26): qty 5000

## 2017-10-06 MED ORDER — HEPARIN (PORCINE) IN NACL 2-0.9 UNITS/ML: INTRAMUSCULAR | Status: DC

## 2017-10-06 MED ORDER — PANTOPRAZOLE SODIUM 40 MG IV SOLR
40.0000 mg | Freq: Two times a day (BID) | INTRAVENOUS | Status: DC
  Administered 2017-10-06 – 2017-10-10 (×8): 40 mg via INTRAVENOUS
  Filled 2017-10-06 (×8): qty 40

## 2017-10-06 MED ORDER — ROCURONIUM BROMIDE 50 MG/5ML IV SOLN
75.0000 mg | Freq: Once | INTRAVENOUS | Status: AC
  Administered 2017-10-06: 75 mg via INTRAVENOUS

## 2017-10-06 MED ORDER — SODIUM BICARBONATE 8.4 % IV SOLN
INTRAVENOUS | Status: DC
  Administered 2017-10-06: 16:00:00 via INTRAVENOUS
  Filled 2017-10-06 (×3): qty 150

## 2017-10-06 MED ORDER — FENTANYL CITRATE (PF) 100 MCG/2ML IJ SOLN
50.0000 ug | INTRAMUSCULAR | Status: DC | PRN
  Administered 2017-10-07 – 2017-10-09 (×7): 50 ug via INTRAVENOUS
  Filled 2017-10-06 (×7): qty 2

## 2017-10-06 MED ORDER — SODIUM CHLORIDE 0.9 % IV SOLN
250.0000 mL | INTRAVENOUS | Status: DC | PRN
  Administered 2017-10-08: 250 mL via INTRAVENOUS

## 2017-10-06 MED ORDER — STERILE WATER FOR INJECTION IV SOLN
INTRAVENOUS | Status: DC
  Administered 2017-10-06 – 2017-10-07 (×6): via INTRAVENOUS_CENTRAL
  Filled 2017-10-06 (×10): qty 150

## 2017-10-06 MED ORDER — CHLORHEXIDINE GLUCONATE 0.12% ORAL RINSE (MEDLINE KIT)
15.0000 mL | Freq: Two times a day (BID) | OROMUCOSAL | Status: DC
  Administered 2017-10-06 – 2017-10-10 (×8): 15 mL via OROMUCOSAL

## 2017-10-06 MED ORDER — SODIUM CHLORIDE 0.9 % IV BOLUS
1000.0000 mL | Freq: Once | INTRAVENOUS | Status: AC
  Administered 2017-10-06: 1000 mL via INTRAVENOUS

## 2017-10-06 MED ORDER — VANCOMYCIN HCL IN DEXTROSE 1-5 GM/200ML-% IV SOLN
1000.0000 mg | INTRAVENOUS | Status: DC
  Administered 2017-10-07: 1000 mg via INTRAVENOUS
  Filled 2017-10-06 (×2): qty 200

## 2017-10-06 MED ORDER — SODIUM BICARBONATE 8.4 % IV SOLN
INTRAVENOUS | Status: AC
  Filled 2017-10-06: qty 50

## 2017-10-06 MED ORDER — DEXTROSE 5 % IV SOLN
INTRAVENOUS | Status: DC
  Filled 2017-10-06: qty 1000

## 2017-10-06 MED ORDER — SODIUM CHLORIDE 0.9 % IV SOLN
1.0000 g | INTRAVENOUS | Status: DC
  Administered 2017-10-06: 1 g via INTRAVENOUS
  Filled 2017-10-06: qty 10

## 2017-10-06 MED ORDER — NOREPINEPHRINE BITARTRATE 1 MG/ML IV SOLN
0.0000 ug/min | Freq: Once | INTRAVENOUS | Status: AC
  Administered 2017-10-06: 2 ug/min via INTRAVENOUS
  Filled 2017-10-06: qty 16

## 2017-10-06 MED ORDER — FENTANYL CITRATE (PF) 100 MCG/2ML IJ SOLN
INTRAMUSCULAR | Status: AC
  Filled 2017-10-06: qty 2

## 2017-10-06 MED ORDER — PIPERACILLIN-TAZOBACTAM 3.375 G IVPB
3.3750 g | Freq: Four times a day (QID) | INTRAVENOUS | Status: DC
  Administered 2017-10-06: 3.375 g via INTRAVENOUS
  Filled 2017-10-06 (×3): qty 50

## 2017-10-06 MED ORDER — HEPARIN SODIUM (PORCINE) 1000 UNIT/ML DIALYSIS
1000.0000 [IU] | INTRAMUSCULAR | Status: DC | PRN
  Administered 2017-10-09: 2800 [IU] via INTRAVENOUS_CENTRAL
  Filled 2017-10-06: qty 6
  Filled 2017-10-06: qty 4
  Filled 2017-10-06: qty 6

## 2017-10-06 MED ORDER — NOREPINEPHRINE BITARTRATE 1 MG/ML IV SOLN: 0.0000 ug/min | Freq: Once | INTRAVENOUS | Status: DC

## 2017-10-06 MED ORDER — FAMOTIDINE IN NACL 20-0.9 MG/50ML-% IV SOLN
20.0000 mg | Freq: Two times a day (BID) | INTRAVENOUS | Status: DC
  Administered 2017-10-06 (×2): 20 mg via INTRAVENOUS
  Filled 2017-10-06 (×2): qty 50

## 2017-10-06 MED ORDER — ETOMIDATE 2 MG/ML IV SOLN
10.0000 mg | Freq: Once | INTRAVENOUS | Status: AC
  Administered 2017-10-06: 10 mg via INTRAVENOUS

## 2017-10-06 MED ORDER — STERILE WATER FOR INJECTION IV SOLN
INTRAVENOUS | Status: DC
  Administered 2017-10-06 – 2017-10-07 (×15): via INTRAVENOUS_CENTRAL
  Filled 2017-10-06 (×27): qty 150

## 2017-10-06 MED ORDER — PIPERACILLIN-TAZOBACTAM 3.375 G IVPB
3.3750 g | Freq: Two times a day (BID) | INTRAVENOUS | Status: DC
  Filled 2017-10-06: qty 50

## 2017-10-06 MED ORDER — HEPARIN (PORCINE) 2000 UNITS/L FOR CRRT: INTRAVENOUS_CENTRAL | Status: DC | PRN

## 2017-10-06 MED ORDER — SODIUM CHLORIDE 0.9 % IV SOLN
10.0000 mL/h | Freq: Once | INTRAVENOUS | Status: AC
  Administered 2017-10-06: 10 mL/h via INTRAVENOUS

## 2017-10-06 MED ORDER — NOREPINEPHRINE BITARTRATE 1 MG/ML IV SOLN
0.0000 ug/min | INTRAVENOUS | Status: DC
  Administered 2017-10-07: 3.5 ug/min via INTRAVENOUS
  Filled 2017-10-06: qty 16

## 2017-10-06 MED ORDER — SODIUM CHLORIDE 0.9 % IV BOLUS
2000.0000 mL | Freq: Once | INTRAVENOUS | Status: AC
  Administered 2017-10-06: 2000 mL via INTRAVENOUS

## 2017-10-06 MED ORDER — NOREPINEPHRINE BITARTRATE 1 MG/ML IV SOLN
0.0000 ug/min | Freq: Once | INTRAVENOUS | Status: AC
  Administered 2017-10-06: 20 ug/min via INTRAVENOUS
  Filled 2017-10-06: qty 4

## 2017-10-06 MED ORDER — PIPERACILLIN-TAZOBACTAM 3.375 G IVPB 30 MIN
3.3750 g | Freq: Once | INTRAVENOUS | Status: AC
  Administered 2017-10-06: 3.375 g via INTRAVENOUS
  Filled 2017-10-06: qty 50

## 2017-10-06 MED ORDER — VANCOMYCIN HCL 10 G IV SOLR
2000.0000 mg | Freq: Once | INTRAVENOUS | Status: AC
  Administered 2017-10-06: 2000 mg via INTRAVENOUS
  Filled 2017-10-06: qty 2000

## 2017-10-06 MED ORDER — PIPERACILLIN-TAZOBACTAM 3.375 G IVPB 30 MIN
3.3750 g | Freq: Four times a day (QID) | INTRAVENOUS | Status: DC
  Administered 2017-10-07 – 2017-10-08 (×5): 3.375 g via INTRAVENOUS
  Filled 2017-10-06 (×6): qty 50

## 2017-10-06 MED ORDER — SODIUM BICARBONATE 8.4 % IV SOLN
50.0000 meq | Freq: Once | INTRAVENOUS | Status: AC
  Administered 2017-10-06: 50 meq via INTRAVENOUS

## 2017-10-06 MED ORDER — DEXTROSE 50 % IV SOLN
INTRAVENOUS | Status: AC
  Administered 2017-10-06: 0.5 via INTRAVENOUS
  Filled 2017-10-06: qty 50

## 2017-10-06 MED ORDER — HEPARIN (PORCINE) IN NACL 2-0.9 UNITS/ML
Freq: Once | INTRAMUSCULAR | Status: AC
  Administered 2017-10-06: 22:00:00 via INTRAVENOUS

## 2017-10-06 MED ORDER — MIDAZOLAM HCL 2 MG/2ML IJ SOLN
INTRAMUSCULAR | Status: AC
  Administered 2017-10-06: 2 mg via INTRAVENOUS
  Filled 2017-10-06: qty 2

## 2017-10-06 NOTE — ED Provider Notes (Signed)
Medical screening examination/treatment/procedure(s) were conducted as a shared visit with non-physician practitioner(s) and myself.  I personally evaluated the patient during the encounter.  Clinical Impression:   Final diagnoses:  Septic shock ([place])  AKI (acute kidney injury) [facility])    The patient is a 67 year old male, his medical history is unclear as he comes from a [facility] assisted living, he does have a Foley catheter in place, he is unable to give any information as he is obtunded, he is able to arouse with painful stimuli and loud voice but has difficulty following commands.  He has been noted on exam to have some hematuria in the Foley catheter, he has no obvious tenderness of his belly, he does have an ostomy on the left which appears to have melena.  He has dry mucous membranes, pupils are 2 mm and symmetrical, he is having difficulty speaking though when he does say a word it seems to be clear.  There is only scant edema of the bilateral lower extremities.  His pulse is approximately 65, he is in a right bundle branch block and based on his prior EKG there is no significant changes.  Labs prehospital showed new onset acute renal failure with a creatinine of 13, BUN of 140 had a hemoglobin of 7.5.  He is hypotensive, critically ill and will need fluid resuscitation as well as likely blood transfusion and evaluation for the cause of his acute kidney injury.  Will treat presumptively for urinary tract infection given this nidus.  The patient is improving on fluids - UA likely source - antibiotics given Nephrology will be involved  Patient was persistently hypotensive despite getting IV fluids and his minimal peripheral access with the need for blood, IV fluids and now IV vasopressors required a central line.  I discussed his care with his sister who is essentially the next of kin who request that we do everything that we can.  At this time a central line will be placed.  The patient is  somnolent and not able to make those decisions himself.  At 1245 the patient is able to breathe, his oxygenation is okay, his heart rate ranges between 40 and 60 and what appears to be a right bundle branch block.  Will discuss with hospitalist.  Nephrology is on board, the patient did not require intubation, he maintained his airway, unfortunately vascular access was difficult and as he was likely to need pressor therapy the decision was made to proceed with central line placement.  However his right internal jugular vein which was the initial site of attempt was difficult to cannulate secondary to some anatomical issues and the deep nature of the vein.  His procedure was aborted and pressure was held over the site.  The vein was punctured with the wire advanced but the catheter was unable to advance over the wire.  This was all confirmed anatomically on ultrasound.  Critical care assisted with placing central venous access and dialysis catheter placement.  .Critical Care Performed by: Noemi Chapel, MD Authorized by: Noemi Chapel, MD   Critical care provider statement:    Critical care time (minutes):  35   Critical care time was exclusive of:  Separately billable procedures and treating other patients and teaching time   Critical care was necessary to treat or prevent imminent or life-threatening deterioration of the following conditions:  Shock and sepsis   Critical care was time spent personally by me on the following activities:  Blood draw for specimens, development  of treatment plan with patient or surrogate, discussions with consultants, evaluation of patient's response to treatment, examination of patient, obtaining history from patient or surrogate, ordering and performing treatments and interventions, ordering and review of laboratory studies, ordering and review of radiographic studies, pulse oximetry, re-evaluation of patient's condition and review of old charts      Noemi Chapel,  MD 10/07/17 [phone]

## 2017-10-06 NOTE — Progress Notes (Signed)
Pharmacy Antibiotic Note  Patrick [name] is a 67 y.o. male admitted on 10/06/2017 with sepsis.  Pharmacy has been consulted for dosage adjustments associated with CRRT.  Plan: Will increase Zosyn to 3.375 q6hr while on CRRT And dose Vancomycin 1 gram q24hr while on CRRT   Weight: 191 lb 2.2 oz (86.7 kg)  Temp (24hrs), Avg:94 F (34.4 C), Min:87.1 F (30.6 C), Max:95.7 F (35.4 C)  Recent Labs  Lab 10/06/17 1123 10/06/17 1205  WBC 4.2  --   CREATININE 14.42*  --   LATICACIDVEN  --  0.30*    Estimated Creatinine Clearance: 5.2 mL/min (A) (by C-G formula based on SCr of 14.42 mg/dL (H)).    Allergies  Allergen Reactions  . Nsaids     Kidney disease    Antimicrobials this admission: Vanc 5/10 >>  Zosyn >>    Thank you for allowing pharmacy to be a part of this patient's care.  Corinda Gubler 10/06/2017 8:08 PM

## 2017-10-06 NOTE — Progress Notes (Signed)
Pharmacy Antibiotic Note  Patrick [name] is a 67 y.o. male admitted on 10/06/2017 with UTI.  Pharmacy has been consulted for ceftriaxone dosing.WBC wnl. LA 0.4.   Plan: -Ceftriaxone 1 gm IV Q 24 hours -Pharmacy to sign off since no further dosage adjustments necessary     No data recorded.  No results for input(s): WBC, CREATININE, LATICACIDVEN, VANCOTROUGH, VANCOPEAK, VANCORANDOM, GENTTROUGH, GENTPEAK, GENTRANDOM, TOBRATROUGH, TOBRAPEAK, TOBRARND, AMIKACINPEAK, AMIKACINTROU, AMIKACIN in the last 168 hours.  CrCl cannot be calculated (Patient's most recent lab result is older than the maximum 21 days allowed.).    Allergies  Allergen Reactions  . Nsaids     Kidney disease      Thank you for allowing pharmacy to be a part of this patient's care.  Albertina Parr, PharmD., BCPS Clinical Pharmacist Clinical phone for 10/06/17 until 3:30pm: [phone] If after 3:30pm, please call main pharmacy at: [phone]

## 2017-10-06 NOTE — ED Triage Notes (Signed)
Pt arrives via EMS from [facility] assisted living sent over for acute renal failure. Labs show creat 13.35, hgb 7.5, CBG check by EMS was initally 56-given D10 for correction, now CBG 175. Pt also hypotensive. BP 70/40. Currently 79/39 with heartrate of 60. Per EMS pt alert, oriented x4, normally able to ambulate independently currently too weak to walk.

## 2017-10-06 NOTE — Consult Note (Signed)
Referring Provider: No ref. provider found Primary Care Physician:  Patient, [name] Primary Nephrologist:    Reason for Consultation:   Acute renal failure  Chronic renal disease  Metabolic acidosis   HPI:  67 year old wheel chair bound patient from [facility] that was brought to the ER for acute encephalopathy  EF 35%    Colon cancer and history of colostostomy   He was found altered at the nursing facility and was sent to the ER   He was found to have a creatinine of 13 and K 5.7 and metabolic acidosis He was given empiric Vancomycin and Zosyn in the ER   He has a history of hypertension and was treated with losartan  He has poor urine output and minimal urine was found in the foley     Past Medical History:  Diagnosis Date  . Arthritis    "right hand; right knee" (06/19/2014)  . CAD (coronary artery disease)    2v CAD with subtotal occlusion of small co-dominant RCA and borderline lesion in moderate-sized OM-1  . CHF (congestive heart failure) (HCC)    Preserved EF  . Coronary artery disease involving native coronary artery of native heart with angina pectoris ([place]) 09/11/2016  . Degenerative joint disease of knee, right   . Dysrhythmia   . Enlarged prostate   . Hyperlipidemia   . Hypertension   . Pneumonia 05/2014  . Prediabetes 10/03/2014  . Scrotal edema 04/03/2015  . SMALL BOWEL OBSTRUCTION, HX OF 08/21/2007   Annotation: with narrowing in the ileocecal region Qualifier: Diagnosis of  By: Hassell Done FNP, Tori Milks      Past Surgical History:  Procedure Laterality Date  . CARDIAC CATHETERIZATION N/A 02/16/2016   Procedure: Right/Left Heart Cath and Coronary Angiography;  Surgeon: Jolaine Artist, MD;  Location: [facility];  Service: Cardiovascular;  Laterality: N/A;  . COLONOSCOPY N/A 04/01/2016   Procedure: COLONOSCOPY;  Surgeon: Leighton Ruff, MD;  Location: [facility];  Service: Endoscopy;  Laterality: N/A;  . INGUINAL HERNIA REPAIR Left 1990's  . IR GENERIC  HISTORICAL  07/05/2016   IR RADIOLOGIST EVAL & MGMT 07/05/2016 Corrie Mckusick, DO [facility] INTERV RAD  . IR RADIOLOGIST EVAL & MGMT  03/29/2017  . XI ROBOT ABDOMINAL PERINEAL RESECTION N/A 09/28/2016   Procedure: XI ROBOT ABDOMINOPERINEAL RESECTION WITH PERMANENT COLOSTOMY ERAS PATHWAY;  Surgeon: Michael Boston, MD;  Location: [facility] ORS;  Service: General;  Laterality: N/A;    Prior to Admission medications   Medication Sig Start Date End Date Taking? Authorizing Provider  allopurinol (ZYLOPRIM) 300 MG tablet take 1 tablet by mouth once daily 04/03/17  Yes Mayo, Pete Pelt, MD  apixaban (ELIQUIS) 5 MG TABS tablet Take 1 tablet (5 mg total) by mouth 2 (two) times daily. 01/26/17  Yes Minus Breeding, MD  atorvastatin (LIPITOR) 10 MG tablet Take 1 tablet by mouth daily. 08/19/17  Yes [provider]  buPROPion (WELLBUTRIN) 100 MG tablet take 1 tablet by mouth twice a day 03/14/17  Yes Mikell, Jeani Sow, MD  carvedilol (COREG) 25 MG tablet take 1 tablets by mouth twice a day 02/21/17  Yes Minus Breeding, MD  cefTRIAXone (ROCEPHIN) 1 g injection Inject 1 g into the muscle daily. For 3 days. Started 10-05-17   Yes [provider]  clotrimazole-betamethasone (LOTRISONE) cream Apply 1 application topically 2 (two) times daily. 08/15/17  Yes [provider]  ferrous sulfate 325 (65 FE) MG tablet Take 1 tablet (325 mg total) by mouth  2 (two) times daily with a meal. 12/26/16  Yes Mayo, Pete Pelt, MD  finasteride (PROSCAR) 5 MG tablet take 1 tablet by mouth once daily 03/14/17  Yes Mikell, Jeani Sow, MD  losartan (COZAAR) 50 MG tablet Take 1 tablet (50 mg total) by mouth daily. 12/09/16  Yes Mayo, Pete Pelt, MD  Multiple Vitamins-Minerals (MULTIVITAMIN WITH MINERALS) tablet Take 1 tablet by mouth daily. Men's 50+ Multivitamin   Yes [provider]  Omega-3 1000 MG CAPS Take 2 capsules (2,000 mg total) by mouth 2 (two) times daily. 12/09/16  Yes Mayo, Pete Pelt, MD  potassium chloride SA  (K-DUR,KLOR-CON) 20 MEQ tablet Take 2 tablets (40 mEq total) by mouth daily. And 20 meq by mouth in the evening 05/26/17  Yes Lockamy, Timothy, DO  tamsulosin (FLOMAX) 0.4 MG CAPS capsule Take 0.4 mg by mouth daily.    Yes [provider]  torsemide (DEMADEX) 20 MG tablet Take 20 mg by mouth 2 (two) times daily.   Yes [provider]  traZODone (DESYREL) 100 MG tablet Take 3 tablets (300 mg total) by mouth at bedtime. 01/02/17  Yes Mayo, Pete Pelt, MD  acetaminophen (TYLENOL) 500 MG tablet Take 1,000 mg by mouth every 6 (six) hours as needed (for pain/fever/headaches.).     [provider]  Capsaicin-Menthol-Methyl Sal (CAPSAICIN-METHYL SAL-MENTHOL) 0.025-1-12 % CREA Apply to left and or right leg as needed once to twice a day for pain Patient not taking: Reported on 10/06/2017 07/25/17   Landis Martins, DPM  lovastatin (MEVACOR) 20 MG tablet Take 1 tablet (20 mg total) by mouth at bedtime. Patient not taking: Reported on 10/06/2017 12/26/16   Mayo, Pete Pelt, MD  methocarbamol (ROBAXIN) 750 MG tablet Take 1 tablet (750 mg total) by mouth 4 (four) times daily as needed (use for muscle cramps/pain). 09/28/16   Michael Boston, MD  nitroGLYCERIN (NITROSTAT) 0.4 MG SL tablet place 1 tablet under the tongue if needed every 5 minutes for chest pain for 3 doses IF NO RELIEF AFTER 1ST DOSE CALL 911. 05/10/17   Mayo, Pete Pelt, MD  oxyCODONE-acetaminophen (PERCOCET/ROXICET) 5-325 MG tablet Take 1 tablet by mouth every 6 (six) hours as needed for severe pain.    [provider]    Current Facility-Administered Medications  Medication Dose Route Frequency Provider Last Rate Last Dose  . 0.9 %  sodium chloride infusion  250 mL Intravenous PRN Erick Colace, NP      . famotidine (PEPCID) IVPB 20 mg premix  20 mg Intravenous Q12H Erick Colace, NP      . norepinephrine (LEVOPHED) 16 mg in dextrose 5 % 250 mL (0.064 mg/mL) infusion  0-40 mcg/min Intravenous Once Salvadore Dom E,  NP      . sodium bicarbonate 150 mEq in dextrose 5 % 1,000 mL infusion   Intravenous Continuous Chesley Mires, MD       Current Outpatient Medications  Medication Sig Dispense Refill  . allopurinol (ZYLOPRIM) 300 MG tablet take 1 tablet by mouth once daily 90 tablet 0  . apixaban (ELIQUIS) 5 MG TABS tablet Take 1 tablet (5 mg total) by mouth 2 (two) times daily. 60 tablet 11  . atorvastatin (LIPITOR) 10 MG tablet Take 1 tablet by mouth daily.    Marland Kitchen buPROPion (WELLBUTRIN) 100 MG tablet take 1 tablet by mouth twice a day 120 tablet 0  . carvedilol (COREG) 25 MG tablet take 1 tablets by mouth twice a day 180 tablet 3  . cefTRIAXone (ROCEPHIN)  1 g injection Inject 1 g into the muscle daily. For 3 days. Started 10-05-17    . clotrimazole-betamethasone (LOTRISONE) cream Apply 1 application topically 2 (two) times daily.    . ferrous sulfate 325 (65 FE) MG tablet Take 1 tablet (325 mg total) by mouth 2 (two) times daily with a meal. 180 tablet 0  . finasteride (PROSCAR) 5 MG tablet take 1 tablet by mouth once daily 90 tablet 1  . losartan (COZAAR) 50 MG tablet Take 1 tablet (50 mg total) by mouth daily. 90 tablet 3  . Multiple Vitamins-Minerals (MULTIVITAMIN WITH MINERALS) tablet Take 1 tablet by mouth daily. Men's 50+ Multivitamin    . Omega-3 1000 MG CAPS Take 2 capsules (2,000 mg total) by mouth 2 (two) times daily. 180 capsule 1  . potassium chloride SA (K-DUR,KLOR-CON) 20 MEQ tablet Take 2 tablets (40 mEq total) by mouth daily. And 20 meq by mouth in the evening 30 tablet 0  . tamsulosin (FLOMAX) 0.4 MG CAPS capsule Take 0.4 mg by mouth daily.     Marland Kitchen torsemide (DEMADEX) 20 MG tablet Take 20 mg by mouth 2 (two) times daily.    . traZODone (DESYREL) 100 MG tablet Take 3 tablets (300 mg total) by mouth at bedtime. 90 tablet 2  . acetaminophen (TYLENOL) 500 MG tablet Take 1,000 mg by mouth every 6 (six) hours as needed (for pain/fever/headaches.).     Marland Kitchen Capsaicin-Menthol-Methyl Sal (CAPSAICIN-METHYL  SAL-MENTHOL) 0.025-1-12 % CREA Apply to left and or right leg as needed once to twice a day for pain (Patient not taking: Reported on 10/06/2017) 56.6 g 1  . lovastatin (MEVACOR) 20 MG tablet Take 1 tablet (20 mg total) by mouth at bedtime. (Patient not taking: Reported on 10/06/2017) 90 tablet 1  . methocarbamol (ROBAXIN) 750 MG tablet Take 1 tablet (750 mg total) by mouth 4 (four) times daily as needed (use for muscle cramps/pain). 30 tablet 2  . nitroGLYCERIN (NITROSTAT) 0.4 MG SL tablet place 1 tablet under the tongue if needed every 5 minutes for chest pain for 3 doses IF NO RELIEF AFTER 1ST DOSE CALL 911. 25 tablet 0  . oxyCODONE-acetaminophen (PERCOCET/ROXICET) 5-325 MG tablet Take 1 tablet by mouth every 6 (six) hours as needed for severe pain.      Allergies as of 10/06/2017 - Review Complete 10/06/2017  Allergen Reaction Noted  . Nsaids  10/07/2016    Family History  Problem Relation Age of Onset  . Hypertension Mother   . Diabetes Mother   . Stroke Mother   . Cancer Father 14       Prostate  . Dementia Father   . COPD Sister   . Arthritis Sister   . Edema Sister     Social History   Socioeconomic History  . Marital status: Single    Spouse name: Not on file  . Number of children: Not on file  . Years of education: Not on file  . Highest education level: Not on file  Occupational History  . Not on file  Social Needs  . Financial resource strain: Not on file  . Food insecurity:    Worry: Not on file    Inability: Not on file  . Transportation needs:    Medical: Not on file    Non-medical: Not on file  Tobacco Use  . Smoking status: Former Smoker    Years: 5.00    Types: Pipe, Landscape architect  . Smokeless tobacco: Never Used  . Tobacco comment: 06/19/2014 "  stopped smoking in ~ 2014; used to smoke a pipe or cigar a couple times/month"  Substance and Sexual Activity  . Alcohol use: No  . Drug use: No  . Sexual activity: Never  Lifestyle  . Physical activity:    Days  per week: Not on file    Minutes per session: Not on file  . Stress: Not on file  Relationships  . Social connections:    Talks on phone: Not on file    Gets together: Not on file    Attends religious service: Not on file    Active member of club or organization: Not on file    Attends meetings of clubs or organizations: Not on file    Relationship status: Not on file  . Intimate partner violence:    Fear of current or ex partner: Not on file    Emotionally abused: Not on file    Physically abused: Not on file    Forced sexual activity: Not on file  Other Topics Concern  . Not on file  Social History Narrative   Single, lives w/his sister of whom he is caregiver    Review of Systems:  Physical Exam: Vital signs in last 24 hours: Temp:  [87.1 F (30.6 C)-93 F (33.9 C)] 93 F (33.9 C) (05/10 1515) Pulse Rate:  [44-69] 62 (05/10 1515) Resp:  [16-22] 17 (05/10 1515) BP: (69-105)/(37-64) 99/51 (05/10 1515) SpO2:  [92 %-100 %] 97 % (05/10 1515)   General:  Confused and disorientated  Head:  Normocephalic and atraumatic. Eyes:  Conjunctiva are pale Ears:  Normal auditory acuity. Nose:  No deformity, discharge,  or lesions. Mouth:   Mucus membranes dry Neck:  Supple; no masses or thyromegaly. JVP  Flat  Lungs:  Clear throughout to auscultation.   No wheezes, crackles, or rhonchi. No acute distress. Heart:  Regular rate and rhythm; no murmurs, clicks, rubs,  or gallops. Abdomen:  Soft, nontender and nondistended. No masses, hepatosplenomegaly or hernias noted. Normal bowel sounds, without guarding, and without rebound.   Msk:  Symmetrical without gross deformities. Normal posture. Pulses:  No carotid, renal, femoral bruits. DP and PT symmetrical and equal    Intake/Output from previous day: No intake/output data recorded. Intake/Output this shift: Total I/O In: 3330 [I.V.:1230; IV Piggyback:2100] Out: 200 [Stool:200]  Lab Results: Recent Labs    10/06/17 1123  WBC  4.2  HGB 7.7*  HCT 23.4*  PLT 32*   BMET Recent Labs    10/06/17 1123  NA 137  K 5.7*  CL 109  CO2 10*  GLUCOSE 83  BUN 143*  CREATININE 14.42*  CALCIUM 8.2*   LFT Recent Labs    10/06/17 1123  PROT 6.9  ALBUMIN 2.6*  AST 9*  ALT 12*  ALKPHOS 100  BILITOT 1.5*   PT/INR Recent Labs    10/06/17 1123  LABPROT 25.0*  INR 2.28   Hepatitis Panel No results for input(s): HEPBSAG, HCVAB, HEPAIGM, HEPBIGM in the last 72 hours.  Studies/Results: Dg Chest Port 1 View  Result Date: 10/06/2017 CLINICAL DATA:  Encounter for central line placement. EXAM: PORTABLE CHEST 1 VIEW COMPARISON:  Radiograph of same day. FINDINGS: Stable cardiomegaly. Hypoinflation of the lungs is noted with mild bibasilar subsegmental atelectasis. Interval placement of left internal jugular dialysis catheter with distal tip in expected position of the SVC. No pneumothorax or significant pleural effusion is noted. Bony thorax is unremarkable. IMPRESSION: Interval placement of left internal jugular dialysis catheter with distal tip  in expected position of SVC. No pneumothorax is noted. Hypoinflation of the lungs with mild bibasilar subsegmental atelectasis. Electronically Signed   By: Marijo Conception, M.D.   On: 10/06/2017 15:23   Dg Chest Portable 1 View  Result Date: 10/06/2017 CLINICAL DATA:  67 year old male with a history of hypotension EXAM: PORTABLE CHEST 1 VIEW COMPARISON:  05/23/2017, 10/08/2016 FINDINGS: Cardiomediastinal silhouette unchanged, likely accentuated by apical lordotic positioning. Persisting cardiomegaly. No pneumothorax.  No large pleural effusion.  Low lung volumes. IMPRESSION: Redemonstration of cardiomegaly. The accentuated appearance is favored to be secondary to the apical lordotic positioning, however, new pericardial effusion not excluded. If there is concern for pericardial effusion, recommend correlation with ECHO. No evidence of lobar pneumonia or overt edema. Electronically  Signed   By: Corrie Mckusick D.O.   On: 10/06/2017 12:15    Assessment/Plan:  Acute kidney injury  Secondary probably to volume depletion and hypotension  We shall check renal ultrasound and urinalysis and evaluate urine electrolytes  I agree with volume resuscitation at this point and would start IV bicarbonate   It is unlikely to be acute glomerulonephritis or acute interstitial nephritis and no recent IV contrast or nephrotoxins   Metabolic acidosis  Replete with IV bicarbonate  Hyperkalemia should respond to correction of acidosis  It does not appear that this patient would be a candidate for dialysis    LOS: 0 Jakaylah Schlafer W @TODAY @3 :43 PM

## 2017-10-06 NOTE — Procedures (Signed)
Central Venous Catheter Insertion Procedure Note Patrick [name] [id number] 1951/01/16  Procedure: Insertion of Central Venous Catheter Indications: Assessment of intravascular volume, Drug and/or fluid administration and Frequent blood sampling  Procedure Details Consent: Unable to obtain consent because of emergent medical necessity. Time Out: Verified patient identification, verified procedure, site/side was marked, verified correct patient position, special equipment/implants available, medications/allergies/relevent history reviewed, required imaging and test results available.  Performed Real time [place] used to ID and cannulate  Maximum sterile technique was used including antiseptics, cap, gloves, gown, hand hygiene, mask and sheet. Skin prep: Chlorhexidine; local anesthetic administered A antimicrobial bonded/coated triple lumen catheter was placed in the left internal jugular vein using the Seldinger technique.  Evaluation Blood flow good Complications: No apparent complications Patient did tolerate procedure well. Chest X-ray ordered to verify placement.  CXR: pending. \   Erick Colace ACNP-BC [place] Pager # [phone] OR # [phone] if no answer  Clementeen Graham 10/06/2017, 3:17 PM

## 2017-10-06 NOTE — ED Provider Notes (Signed)
[facility] EMERGENCY DEPARTMENT Provider Note   CSN: [id number] Arrival date & time: 10/06/17  1028     History   Chief Complaint Chief Complaint  Patient presents with  . Abnormal Lab    HPI Level 5 caveat secondary to altered mental status  Patrick [name] is a 67 y.o. male with history of CAD, CHF, hypertension who presents with decreased level of consciousness and abnormal labs from assisted living facility, [facility].  Patient presents with paperwork showing creatinine of 13.35 and hemoglobin 7.5.  He is sent for evaluation of acute renal failure.  Per EMS, patient initially had CBG of 56 and was given D10 for correction, most recently 175.  Patient also reportedly hypotensive 70/40.  Patient is normally able to ambulate independently, but is currently too weak to walk.  HPI  Past Medical History:  Diagnosis Date  . Arthritis    "right hand; right knee" (06/19/2014)  . CAD (coronary artery disease)    2v CAD with subtotal occlusion of small co-dominant RCA and borderline lesion in moderate-sized OM-1  . CHF (congestive heart failure) (HCC)    Preserved EF  . Coronary artery disease involving native coronary artery of native heart with angina pectoris ([place]) 09/11/2016  . Degenerative joint disease of knee, right   . Dysrhythmia   . Enlarged prostate   . Hyperlipidemia   . Hypertension   . Pneumonia 05/2014  . Prediabetes 10/03/2014  . Scrotal edema 04/03/2015  . SMALL BOWEL OBSTRUCTION, HX OF 08/21/2007   Annotation: with narrowing in the ileocecal region Qualifier: Diagnosis of  By: Hassell Done FNP, Tori Milks      Patient Active Problem List   Diagnosis Date Noted  . Shock circulatory ([place]) 10/06/2017  . Acute renal disease   . Community acquired bacterial pneumonia 05/24/2017  . Renal cell carcinoma, left ([place]) 05/24/2017  . Liver cirrhosis ([place]) 05/24/2017  . Pneumonia 05/23/2017  . Balance problem 03/07/2017  . Pulmonary HTN ([place]) 11/10/2016  . History  of resection of rectum 09/28/2016  . Rectal cancer ([place]) 09/28/2016  . Atrial fibrillation ([place]) 09/11/2016  . Coronary artery disease involving native coronary artery of native heart with angina pectoris ([place]) 09/11/2016  . Nonischemic cardiomyopathy ([place]) 09/11/2016  . Thrombocytopenia ([place]) 05/29/2016  . Adenocarcinoma of rectum s/p robotic APR/colostomy 09/28/2016 04/14/2016  . Chronic kidney disease (CKD), stage III (moderate) ([place]) 02/05/2016  . Abnormal finding on cardiovascular stress test 02/05/2016  . Cardiomyopathy, ischemic 06/17/2015  . Acute on chronic clinical systolic heart failure ([place]) 06/17/2015  . Major depressive disorder, recurrent episode ([place]) 04/17/2015  . Hypokalemia 08/29/2014  . Other iron deficiency anemias   . Elevated troponin I measurement   . Encounter for central line placement 03/11/2014  . Obesity, unspecified 10/03/2013  . Dyslipidemia 08/13/2013  . MACULAR DEGENERATION 05/02/2008  . ERECTILE DYSFUNCTION 03/13/2007  . Essential hypertension 01/16/2007  . BPH (benign prostatic hyperplasia) 01/16/2007    Past Surgical History:  Procedure Laterality Date  . CARDIAC CATHETERIZATION N/A 02/16/2016   Procedure: Right/Left Heart Cath and Coronary Angiography;  Surgeon: Jolaine Artist, MD;  Location: [facility];  Service: Cardiovascular;  Laterality: N/A;  . COLONOSCOPY N/A 04/01/2016   Procedure: COLONOSCOPY;  Surgeon: Leighton Ruff, MD;  Location: [facility];  Service: Endoscopy;  Laterality: N/A;  . INGUINAL HERNIA REPAIR Left 1990's  . IR GENERIC HISTORICAL  07/05/2016   IR RADIOLOGIST EVAL & MGMT 07/05/2016 Corrie Mckusick, DO [facility] INTERV RAD  . IR  RADIOLOGIST EVAL & MGMT  03/29/2017  . XI ROBOT ABDOMINAL PERINEAL RESECTION N/A 09/28/2016   Procedure: XI ROBOT ABDOMINOPERINEAL RESECTION WITH PERMANENT COLOSTOMY ERAS PATHWAY;  Surgeon: Michael Boston, MD;  Location: [facility] ORS;  Service: General;  Laterality: N/A;        Home Medications     Prior to Admission medications   Medication Sig Start Date End Date Taking? Authorizing Provider  allopurinol (ZYLOPRIM) 300 MG tablet take 1 tablet by mouth once daily 04/03/17  Yes Mayo, Pete Pelt, MD  apixaban (ELIQUIS) 5 MG TABS tablet Take 1 tablet (5 mg total) by mouth 2 (two) times daily. 01/26/17  Yes Minus Breeding, MD  atorvastatin (LIPITOR) 10 MG tablet Take 1 tablet by mouth daily. 08/19/17  Yes [provider]  buPROPion (WELLBUTRIN) 100 MG tablet take 1 tablet by mouth twice a day 03/14/17  Yes Mikell, Jeani Sow, MD  carvedilol (COREG) 25 MG tablet take 1 tablets by mouth twice a day 02/21/17  Yes Minus Breeding, MD  cefTRIAXone (ROCEPHIN) 1 g injection Inject 1 g into the muscle daily. For 3 days. Started 10-05-17   Yes [provider]  clotrimazole-betamethasone (LOTRISONE) cream Apply 1 application topically 2 (two) times daily. 08/15/17  Yes [provider]  ferrous sulfate 325 (65 FE) MG tablet Take 1 tablet (325 mg total) by mouth 2 (two) times daily with a meal. 12/26/16  Yes Mayo, Pete Pelt, MD  finasteride (PROSCAR) 5 MG tablet take 1 tablet by mouth once daily 03/14/17  Yes Mikell, Jeani Sow, MD  losartan (COZAAR) 50 MG tablet Take 1 tablet (50 mg total) by mouth daily. 12/09/16  Yes Mayo, Pete Pelt, MD  Multiple Vitamins-Minerals (MULTIVITAMIN WITH MINERALS) tablet Take 1 tablet by mouth daily. Men's 50+ Multivitamin   Yes [provider]  Omega-3 1000 MG CAPS Take 2 capsules (2,000 mg total) by mouth 2 (two) times daily. 12/09/16  Yes Mayo, Pete Pelt, MD  potassium chloride SA (K-DUR,KLOR-CON) 20 MEQ tablet Take 2 tablets (40 mEq total) by mouth daily. And 20 meq by mouth in the evening 05/26/17  Yes Lockamy, Timothy, DO  tamsulosin (FLOMAX) 0.4 MG CAPS capsule Take 0.4 mg by mouth daily.    Yes [provider]  torsemide (DEMADEX) 20 MG tablet Take 20 mg by mouth 2 (two) times daily.   Yes [provider]  traZODone  (DESYREL) 100 MG tablet Take 3 tablets (300 mg total) by mouth at bedtime. 01/02/17  Yes Mayo, Pete Pelt, MD  acetaminophen (TYLENOL) 500 MG tablet Take 1,000 mg by mouth every 6 (six) hours as needed (for pain/fever/headaches.).     [provider]  Capsaicin-Menthol-Methyl Sal (CAPSAICIN-METHYL SAL-MENTHOL) 0.025-1-12 % CREA Apply to left and or right leg as needed once to twice a day for pain Patient not taking: Reported on 10/06/2017 07/25/17   Landis Martins, DPM  lovastatin (MEVACOR) 20 MG tablet Take 1 tablet (20 mg total) by mouth at bedtime. Patient not taking: Reported on 10/06/2017 12/26/16   Mayo, Pete Pelt, MD  methocarbamol (ROBAXIN) 750 MG tablet Take 1 tablet (750 mg total) by mouth 4 (four) times daily as needed (use for muscle cramps/pain). 09/28/16   Michael Boston, MD  nitroGLYCERIN (NITROSTAT) 0.4 MG SL tablet place 1 tablet under the tongue if needed every 5 minutes for chest pain for 3 doses IF NO RELIEF AFTER 1ST DOSE CALL 911. 05/10/17   Mayo, Pete Pelt, MD  oxyCODONE-acetaminophen (PERCOCET/ROXICET) 5-325 MG tablet Take 1 tablet by  mouth every 6 (six) hours as needed for severe pain.    [provider]    Family History Family History  Problem Relation Age of Onset  . Hypertension Mother   . Diabetes Mother   . Stroke Mother   . Cancer Father 19       Prostate  . Dementia Father   . COPD Sister   . Arthritis Sister   . Edema Sister     Social History Social History   Tobacco Use  . Smoking status: Former Smoker    Years: 5.00    Types: Pipe, Landscape architect  . Smokeless tobacco: Never Used  . Tobacco comment: 06/19/2014 "stopped smoking in ~ 2014; used to smoke a pipe or cigar a couple times/month"  Substance Use Topics  . Alcohol use: No  . Drug use: No     Allergies   Nsaids   Review of Systems Review of Systems  Unable to perform ROS: Mental status change     Physical Exam Updated Vital Signs BP (!) 99/51   Pulse 62   Temp (!) 93 F  (33.9 C)   Resp 17   SpO2 97%   Physical Exam  Constitutional: He appears well-developed and well-nourished. No distress.  HENT:  Head: Normocephalic and atraumatic.  Mouth/Throat: Oropharynx is clear and moist. No oropharyngeal exudate.  Eyes: Pupils are equal, round, and reactive to light. Conjunctivae are normal. Right eye exhibits no discharge. Left eye exhibits no discharge. No scleral icterus.  Neck: Normal range of motion. Neck supple. No thyromegaly present.  Cardiovascular: Normal rate, regular rhythm, normal heart sounds and intact distal pulses. Exam reveals no gallop and no friction rub.  No murmur heard. Pulmonary/Chest: Effort normal and breath sounds normal. No stridor. No respiratory distress. He has no wheezes. He has no rales.  Abdominal: Soft. Bowel sounds are normal. He exhibits no distension. There is no tenderness. There is no rebound and no guarding.  Ostomy site with mild skin breakdown around, melena appearing stool in the ostomy bag  Genitourinary:  Genitourinary Comments: Foley catheter in place with hematuria noted in Foley bag  Musculoskeletal: He exhibits no edema.  Lymphadenopathy:    He has no cervical adenopathy.  Neurological: Coordination normal.  Patient obtunded with response to painful stimuli and loud voice, patient can say his name  Skin: Skin is warm and dry. No rash noted. He is not diaphoretic. No pallor.  Psychiatric: He has a normal mood and affect.  Nursing note and vitals reviewed.    ED Treatments / Results  Labs (all labs ordered are listed, but only abnormal results are displayed) Labs Reviewed  COMPREHENSIVE METABOLIC PANEL - Abnormal; Notable for the following components:      Result Value   Potassium 5.7 (*)    CO2 10 (*)    BUN 143 (*)    Creatinine, Ser 14.42 (*)    Calcium 8.2 (*)    Albumin 2.6 (*)    AST 9 (*)    ALT 12 (*)    Total Bilirubin 1.5 (*)    GFR calc non Af Amer 3 (*)    GFR calc Af Amer 3 (*)     Anion gap 18 (*)    All other components within normal limits  CBC WITH DIFFERENTIAL/PLATELET - Abnormal; Notable for the following components:   RBC 2.84 (*)    Hemoglobin 7.7 (*)    HCT 23.4 (*)    RDW 21.9 (*)  Platelets 32 (*)    Lymphs Abs 0.3 (*)    All other components within normal limits  PROTIME-INR - Abnormal; Notable for the following components:   Prothrombin Time 25.0 (*)    All other components within normal limits  URINALYSIS, ROUTINE W REFLEX MICROSCOPIC - Abnormal; Notable for the following components:   Color, Urine RED (*)    APPearance TURBID (*)    Glucose, UA   (*)    Value: TEST NOT REPORTED DUE TO COLOR INTERFERENCE OF URINE PIGMENT   Hgb urine dipstick   (*)    Value: TEST NOT REPORTED DUE TO COLOR INTERFERENCE OF URINE PIGMENT   Bilirubin Urine   (*)    Value: TEST NOT REPORTED DUE TO COLOR INTERFERENCE OF URINE PIGMENT   Ketones, ur   (*)    Value: TEST NOT REPORTED DUE TO COLOR INTERFERENCE OF URINE PIGMENT   Protein, ur   (*)    Value: TEST NOT REPORTED DUE TO COLOR INTERFERENCE OF URINE PIGMENT   Nitrite   (*)    Value: TEST NOT REPORTED DUE TO COLOR INTERFERENCE OF URINE PIGMENT   Leukocytes, UA   (*)    Value: TEST NOT REPORTED DUE TO COLOR INTERFERENCE OF URINE PIGMENT   All other components within normal limits  URINALYSIS, MICROSCOPIC (REFLEX) - Abnormal; Notable for the following components:   Bacteria, UA MANY (*)    All other components within normal limits  I-STAT CG4 LACTIC ACID, ED - Abnormal; Notable for the following components:   Lactic Acid, Venous 0.30 (*)    All other components within normal limits  POC OCCULT BLOOD, ED - Abnormal; Notable for the following components:   Fecal Occult Bld POSITIVE (*)    All other components within normal limits  CULTURE, BLOOD (ROUTINE X 2)  CULTURE, BLOOD (ROUTINE X 2)  URINE CULTURE  CULTURE, BLOOD (ROUTINE X 2)  CULTURE, BLOOD (ROUTINE X 2)  URINE CULTURE  COMPREHENSIVE METABOLIC  PANEL  COMPREHENSIVE METABOLIC PANEL  MAGNESIUM  PHOSPHORUS  TROPONIN I  TROPONIN I  TROPONIN I  PROCALCITONIN  CBC  CBG MONITORING, ED  TYPE AND SCREEN  PREPARE RBC (CROSSMATCH)  ABO/RH    EKG EKG Interpretation  Date/Time:  Friday Oct 06 2017 10:36:45 EDT Ventricular Rate:  65 PR Interval:    QRS Duration: 224 QT Interval:  559 QTC Calculation: 582 R Axis:   -122 Text Interpretation:  Atrial fibrillation Right bundle branch block since last tracing no significant change Abnormal ekg Confirmed by Noemi Chapel [phone]) on 10/06/2017 10:45:57 AM   Radiology Dg Chest Portable 1 View  Result Date: 10/06/2017 CLINICAL DATA:  67 year old male with a history of hypotension EXAM: PORTABLE CHEST 1 VIEW COMPARISON:  05/23/2017, 10/08/2016 FINDINGS: Cardiomediastinal silhouette unchanged, likely accentuated by apical lordotic positioning. Persisting cardiomegaly. No pneumothorax.  No large pleural effusion.  Low lung volumes. IMPRESSION: Redemonstration of cardiomegaly. The accentuated appearance is favored to be secondary to the apical lordotic positioning, however, new pericardial effusion not excluded. If there is concern for pericardial effusion, recommend correlation with ECHO. No evidence of lobar pneumonia or overt edema. Electronically Signed   By: Corrie Mckusick D.O.   On: 10/06/2017 12:15    Procedures Procedures (including critical care time).  CRITICAL CARE Performed by: Frederica Kuster   Total critical care time: 35 minutes  Critical care time was exclusive of separately billable procedures and treating other patients.  Critical care was necessary to treat or prevent  imminent or life-threatening deterioration.  Critical care was time spent personally by me on the following activities: development of treatment plan with patient and/or surrogate as well as nursing, discussions with consultants, evaluation of patient's response to treatment, examination of patient,  obtaining history from patient or surrogate, ordering and performing treatments and interventions, ordering and review of laboratory studies, ordering and review of radiographic studies, pulse oximetry and re-evaluation of patient's condition.   Medications Ordered in ED Medications  0.9 %  sodium chloride infusion (has no administration in time range)  dextrose 5 % 1,000 mL with sodium bicarbonate 150 mEq infusion (has no administration in time range)  famotidine (PEPCID) IVPB 20 mg premix (has no administration in time range)  norepinephrine (LEVOPHED) 16 mg in dextrose 5 % 250 mL (0.064 mg/mL) infusion (has no administration in time range)  sodium chloride 0.9 % bolus 2,000 mL (0 mLs Intravenous Stopped 10/06/17 1420)  0.9 %  sodium chloride infusion (0 mL/hr Intravenous Stopped 10/06/17 1258)  dextrose 50 % solution (0.5 ampules Intravenous Given 10/06/17 1310)  sodium bicarbonate 1 mEq/mL injection (50 mEq  Given 10/06/17 1425)  norepinephrine (LEVOPHED) 4 mg in dextrose 5 % 250 mL (0.016 mg/mL) infusion (20 mcg/min Intravenous New Bag/Given 10/06/17 1450)     Initial Impression / Assessment and Plan / ED Course  I have reviewed the triage vital signs and the nursing notes.  Pertinent labs & imaging results that were available during my care of the patient were reviewed by me and considered in my medical decision making (see chart for details).  Clinical Course as of Oct 07 1518  Fri Oct 06, 2017  1221 I discussed patient case with Dr. Marval Regal with nephrology who will make Dr. Justin Mend aware of the patient's acute renal failure and they will follow the patient and plan for dialysis.   [AL]  1519 I spoke with Dr. Alessandra Bevels with [facility] who will evaluate the patient for suspected upper GI bleed.   [AL]    Clinical Course User Index [AL] Frederica Kuster, PA-C    Patient with acute renal failure and sepsis. Patient is hypotensive and hypothermic (92.33F).  Patient also  with suspected upper GI bleed as patient has melena in his ostomy bag which is fecal occult positive.  Hemoglobin 7.7.  BUN 143, creatinine 14.42.  Potassium 5.7, anion gap 18. 2 units of blood initiated emergently considering hypotension.  Consent obtained by patient's sister over the phone who spoke with Dr. Sabra Heck.  Rocephin initiated for suspected urosepsis.  UA shows many bacteria, 21-50 WBCs.  2 L normal saline given in the ED.  Despite fluid resuscitation, patient's pressures still soft.  Patient put on bear hugger for hypothermia.  Central line placement attempted by Dr. Sabra Heck, however considering dialysis necessary, critical care advised that they put in a try dialysis catheter, which was completed by nurse practitioner, Marni Griffon.  Critical care medicine will also assume patient care and admit to the ICU for further management.  I discussed patient case with nephrology and gastroenterology as well who will evaluate and follow the patient.  Patient also evaluated by Dr. Sabra Heck who guided the patient's management and agrees with plan  Final Clinical Impressions(s) / ED Diagnoses   Final diagnoses:  Septic shock ([place])  AKI (acute kidney injury) [facility])    ED Discharge Orders    None       Frederica Kuster, PA-C 10/06/17 1522    Noemi Chapel, MD 10/07/17 [phone]

## 2017-10-06 NOTE — Procedures (Signed)
Central Venous Catheter Insertion Procedure Note COLLEN VINCENT [id number] 04-06-1951  Procedure: Insertion of Central Venous Catheter Indications: Assessment of intravascular volume, Drug and/or fluid administration and Frequent blood sampling  Procedure Details Consent: Unable to obtain consent because of emergent medical necessity. Time Out: Verified patient identification, verified procedure, site/side was marked, verified correct patient position, special equipment/implants available, medications/allergies/relevent history reviewed, required imaging and test results available.  Performed  Maximum sterile technique was used including antiseptics, cap, gloves, gown, hand hygiene, mask and sheet. Skin prep: Chlorhexidine; local anesthetic administered A antimicrobial bonded/coated triple lumen catheter was placed in the right femoral vein due to emergent situation/no other available access using the Seldinger technique.  Evaluation Blood flow good Complications: No apparent complications Patient did tolerate procedure well.   Placement verified with Ultrasound and VBG.   Hayden Pedro, AGACNP-BC [facility]  Pgr: [phone]  PCCM Pgr: [phone]

## 2017-10-06 NOTE — ED Notes (Signed)
[place], sister.

## 2017-10-06 NOTE — Progress Notes (Signed)
CRITICAL VALUE ALERT  Critical Value:  Troponin 0.33  Date & Time Notied:  10/06/2017 2157  Provider Notified: Hayden Pedro  Orders Received/Actions taken:

## 2017-10-06 NOTE — Procedures (Signed)
Intubation Procedure Note LEONIDUS ROWAND [id number] 06/20/1950  Procedure: Intubation Indications: Respiratory insufficiency  Procedure Details Consent: Unable to obtain consent because of altered level of consciousness. Time Out: Verified patient identification, verified procedure, site/side was marked, verified correct patient position, special equipment/implants available, medications/allergies/relevent history reviewed, required imaging and test results available.  Performed  Maximum sterile technique was used including gloves, gown and hand hygiene.  MAC and 4  Given 2 mg versed, 50 mcg fentanyl, 10 mg etomidate, 75 mg rocuronium.    Used glidescope.  Difficult airway that was anterior.  Inserted 7.5 ETT to 23 cm at lip.  Confirmed with CO2 detector and ausculation.  Evaluation Hemodynamic Status: BP stable throughout; O2 sats: stable throughout Patient's Current Condition: stable Complications: No apparent complications Patient did tolerate procedure well. Chest X-ray ordered to verify placement.  CXR: pending.   Chesley Mires, MD [facility] Pulmonary/Critical Care 10/06/2017, 7:03 PM

## 2017-10-06 NOTE — ED Notes (Addendum)
Pt with long pauses on cardiac monitor. HR alarming 35. Spo2 88%. Notified Dr. Sabra Heck who verbally acknowledged results and at bedside with pt attempting central line placement. BP 78/46. Placed on 5L [place]. Pt arouses to verbal stimuli.

## 2017-10-06 NOTE — ED Notes (Signed)
Attempted to call patients sister, Deja Kaigler to obtain consent for blood transfusion.

## 2017-10-06 NOTE — H&P (Signed)
PULMONARY / CRITICAL CARE MEDICINE   Name: AUTHUR CUBIT MRN: [id number] DOB: [birth year]    ADMISSION DATE:  10/06/2017 CONSULTATION DATE:  5/10  REFERRING MD:  Sabra Heck  CHIEF COMPLAINT: Acute encephalopathy, shock, renal failure  HISTORY OF PRESENT ILLNESS:   This is a 67 yo chronically ill appearing wm.sig h/o CAD, svr ICM w/ EF 30-35%, remote colon cancer w/ perm end colostomy back in 2018/may. Also has h/o stage III-IV CKD. Resides at SNF (not clear when or why this started). He is WC bound. Presents to ED 5/10 after being found at SNF w/ altered LOC and several lab abnormalities including: cr 13.35,  K 5.7, and + AG metabolic acidosis w/; nml LA.  hgb 7.5 hypoglycemia and hypotension w/ SBP in 70s.  ER course: IVFs, 1 unit PRBCs, cultures obtained and empiric abx Remained hypotensive w/ best BP in 90s. Not able to pass CVL. PCCM asked to see for critical care services as well as to obtain access for anticipated need for urgent HD.    PAST MEDICAL HISTORY :  He  has a past medical history of Arthritis, CAD (coronary artery disease), CHF (congestive heart failure) ([place]), Coronary artery disease involving native coronary artery of native heart with angina pectoris ([place]) (09/11/2016), Degenerative joint disease of knee, right, Dysrhythmia, Enlarged prostate, Hyperlipidemia, Hypertension, Pneumonia (05/2014), Prediabetes (10/03/2014), Scrotal edema (04/03/2015), and SMALL BOWEL OBSTRUCTION, HX OF (08/21/2007).  PAST SURGICAL HISTORY: He  has a past surgical history that includes Inguinal hernia repair (Left, 1990's); Cardiac catheterization (N/A, 02/16/2016); Colonoscopy (N/A, 04/01/2016); ir generic historical (07/05/2016); XI abdominal perineal resection (N/A, 09/28/2016); and IR Radiologist Eval & Mgmt (03/29/2017).  Allergies  Allergen Reactions  . Nsaids     Kidney disease    No current facility-administered medications on file prior to encounter.    Current Outpatient Medications on File Prior  to Encounter  Medication Sig  . allopurinol (ZYLOPRIM) 300 MG tablet take 1 tablet by mouth once daily  . apixaban (ELIQUIS) 5 MG TABS tablet Take 1 tablet (5 mg total) by mouth 2 (two) times daily.  Marland Kitchen atorvastatin (LIPITOR) 10 MG tablet Take 1 tablet by mouth daily.  Marland Kitchen buPROPion (WELLBUTRIN) 100 MG tablet take 1 tablet by mouth twice a day  . carvedilol (COREG) 25 MG tablet take 1 tablets by mouth twice a day  . cefTRIAXone (ROCEPHIN) 1 g injection Inject 1 g into the muscle daily. For 3 days. Started 10-05-17  . clotrimazole-betamethasone (LOTRISONE) cream Apply 1 application topically 2 (two) times daily.  . ferrous sulfate 325 (65 FE) MG tablet Take 1 tablet (325 mg total) by mouth 2 (two) times daily with a meal.  . finasteride (PROSCAR) 5 MG tablet take 1 tablet by mouth once daily  . losartan (COZAAR) 50 MG tablet Take 1 tablet (50 mg total) by mouth daily.  . Multiple Vitamins-Minerals (MULTIVITAMIN WITH MINERALS) tablet Take 1 tablet by mouth daily. Men's 50+ Multivitamin  . Omega-3 1000 MG CAPS Take 2 capsules (2,000 mg total) by mouth 2 (two) times daily.  . potassium chloride SA (K-DUR,KLOR-CON) 20 MEQ tablet Take 2 tablets (40 mEq total) by mouth daily. And 20 meq by mouth in the evening  . tamsulosin (FLOMAX) 0.4 MG CAPS capsule Take 0.4 mg by mouth daily.   Marland Kitchen torsemide (DEMADEX) 20 MG tablet Take 20 mg by mouth 2 (two) times daily.  . traZODone (DESYREL) 100 MG tablet Take 3 tablets (300 mg total) by mouth at bedtime.  Marland Kitchen acetaminophen (  TYLENOL) 500 MG tablet Take 1,000 mg by mouth every 6 (six) hours as needed (for pain/fever/headaches.).   Marland Kitchen Capsaicin-Menthol-Methyl Sal (CAPSAICIN-METHYL SAL-MENTHOL) 0.025-1-12 % CREA Apply to left and or right leg as needed once to twice a day for pain (Patient not taking: Reported on 10/06/2017)  . lovastatin (MEVACOR) 20 MG tablet Take 1 tablet (20 mg total) by mouth at bedtime. (Patient not taking: Reported on 10/06/2017)  . methocarbamol  (ROBAXIN) 750 MG tablet Take 1 tablet (750 mg total) by mouth 4 (four) times daily as needed (use for muscle cramps/pain).  . nitroGLYCERIN (NITROSTAT) 0.4 MG SL tablet place 1 tablet under the tongue if needed every 5 minutes for chest pain for 3 doses IF NO RELIEF AFTER 1ST DOSE CALL 911.  Marland Kitchen oxyCODONE-acetaminophen (PERCOCET/ROXICET) 5-325 MG tablet Take 1 tablet by mouth every 6 (six) hours as needed for severe pain.    FAMILY HISTORY:  His indicated that his mother is deceased. He indicated that his father is deceased. He indicated that his sister is alive. He indicated that his maternal grandmother is deceased. He indicated that his maternal grandfather is deceased. He indicated that his paternal grandmother is deceased. He indicated that his paternal grandfather is deceased.   SOCIAL HISTORY: He  reports that he has quit smoking. His smoking use included pipe and cigars. He quit after 5.00 years of use. He has never used smokeless tobacco. He reports that he does not drink alcohol or use drugs.  REVIEW OF SYSTEMS:   Unable due to encephalopathy  SUBJECTIVE:  Confused  VITAL SIGNS: Blood Pressure (Abnormal) 84/47   Pulse (Abnormal) 52   Temperature (Abnormal) 92.3 F (33.5 C) (Rectal)   Respiration 16   Oxygen Saturation 97%   HEMODYNAMICS:    VENTILATOR SETTINGS:    INTAKE / OUTPUT: No intake/output data recorded.  PHYSICAL EXAMINATION: General: Chronically ill-appearing white male currently encephalopathic and intensive Neuro: Awake, not able to talk, moans some.  Moves all extremities, purposeful but will not follow commands HEENT: Sclera nonicteric normocephalic no jugular venous distention right IJ dressing following attempted IV access mucous membranes dry Cardiovascular: No murmur rub or gallop, bradycardic rhythm with frequent pauses Lungs: Scattered rhonchi mild accessory use Abdomen: Soft, ostomy unremarkable Musculoskeletal: Equal strength in both Skin:  Cool, brisk cap refill  LABS:  BMET Recent Labs  Lab 10/06/17 1123  NA 137  K 5.7*  CL 109  CO2 10*  BUN 143*  CREATININE 14.42*  GLUCOSE 83    Electrolytes Recent Labs  Lab 10/06/17 1123  CALCIUM 8.2*    CBC Recent Labs  Lab 10/06/17 1123  WBC 4.2  HGB 7.7*  HCT 23.4*  PLT 32*    Coag's Recent Labs  Lab 10/06/17 1123  INR 2.28    Sepsis Markers Recent Labs  Lab 10/06/17 1205  LATICACIDVEN 0.30*    ABG No results for input(s): PHART, PCO2ART, PO2ART in the last 168 hours.  Liver Enzymes Recent Labs  Lab 10/06/17 1123  AST 9*  ALT 12*  ALKPHOS 100  BILITOT 1.5*  ALBUMIN 2.6*    Cardiac Enzymes No results for input(s): TROPONINI, PROBNP in the last 168 hours.  Glucose Recent Labs  Lab 10/06/17 1305  GLUCAP 67    Imaging Dg Chest Portable 1 View  Result Date: 10/06/2017 CLINICAL DATA:  67 year old male with a history of hypotension EXAM: PORTABLE CHEST 1 VIEW COMPARISON:  05/23/2017, 10/08/2016 FINDINGS: Cardiomediastinal silhouette unchanged, likely accentuated by apical lordotic positioning. Persisting cardiomegaly.  No pneumothorax.  No large pleural effusion.  Low lung volumes. IMPRESSION: Redemonstration of cardiomegaly. The accentuated appearance is favored to be secondary to the apical lordotic positioning, however, new pericardial effusion not excluded. If there is concern for pericardial effusion, recommend correlation with ECHO. No evidence of lobar pneumonia or overt edema. Electronically Signed   By: Corrie Mckusick D.O.   On: 10/06/2017 12:15     STUDIES:  Echocardiogram 5/10  CULTURES: Blood cultures 5/10 Urine culture 5/10  ANTIBIOTICS: Vancomycin 5/10 Zosyn 5/10  SIGNIFICANT EVENTS:   LINES/TUBES: Left IJ dialysis catheter 5/10  DISCUSSION: Chronically ill 67 year old male patient resides a nursing home.  Appears to be deconditioned at baseline.  Review of medical records indicate he is wheelchair-bound.  Has a  history of ischemic cardiomyopathy, atrial fibrillation CKD stage III.  Not clear if he is infected or simply hypovolemic.  Chest x-ray is clear.  Will ensure adequate volume resuscitation, initiate bicarbonate drip.  A dialysis catheter is been placed in case he needs dialysis.  Currently getting blood, volume, bicarbonate infusion.  We will place him on empiric antibiotics as we await culture data to return nephrology is been consulted.  ASSESSMENT / PLAN:  Acute encephalopathy superimposed on chronic illness and underlying deconditioning.  -Unclear what his baseline cognitive status is.  May have some degree of underlying depression, also wonder about element of dementia  plan Continue supportive care Correct his metabolic derangements Will need further history from family in regards to baseline cognitive function Holding Wellbutrin  Circulatory shock.  Presume hypovolemic versus sepsis.  Does have underlying cardiomyopathy with EF 30 to 35%.  Also current metabolic acidosis likely contributing to decreased cardiac output -Favor hypovolemia and acidosis as likely contributing causes no clear source of infection except perhaps Urinary tract Plan Continue IV resuscitation Central access Ensure 30 mL's per kilogram IV fluid bolus Emergent dialysis for life-threatening acidosis and hyperkalemia Cardiac echocardiogram evaluate for change in ejection fraction as well as rule out pericardial effusion Levophed for mean arterial pressure greater than 65 [place] Empiric nosocomial antibiotics with vancomycin and Zosyn Telemetry monitoring and admission to the intensive care  Bradycardia with frequent pause, history of atrial fibrillation Plan        Cycle twelve-lead, cycle cardiac enzymes Echocardiogram Correct acidosis Holding apixaban given anemia and unclear etiology of shock Continue telemetry monitoring Holding Coreg  Acute anion gap metabolic acidosis.  Suspect this is the  setting of use acute kidney injury.  Lactic acid normal. Plan Bicarbonate drip for now given threatening acidosis Serial chemistries Dialysis catheter placed, may need CRRT Nephrology consulted  Acute on chronic renal failure.  With stage III/IV chronic kidney disease.  Baseline serum creatinine around 1.6.  Presenting to the ER with creatinine 14.42;  Plan Ensure volume resuscitated Renal ultrasound Bicarbonate infusion Serial chemistries Foley catheter placed, continue strict intake output Nephrology consulted with possible need for CRRT Holding allopurinol, Proscar, and Demadex  Mild hyperkalemia Plan Correct acidosis Follow-up chemistry May need dialysis if continues to rise, and meantime pushing bicarbonate, and resuscitating Holding oral potassium  Anemia has history of colon cancer.  Fecal occult blood positive today but was also positive a year ago.  Last hemoglobin actually 1 year ago per our records no obvious hemorrhage Plan N.p.o. for now Transfuse Serial CBC Holding anticoagulation  Severe thrombocytopenia as well as borderline coagulopathy.  Etiology unclear.  Question hepatic congestion versus possible sepsis related Plan Serial CBCs Transfuse for platelets less than 25 Trend INR Holding  anticoagulation   FAMILY  - Updates:   - Inter-disciplinary family meet or Palliative Care meeting due by: [phone]  My critical care x45 minutes*  Erick Colace ACNP-BC [place] Pager # [phone] OR # [phone] if no answer   10/06/2017, 1:53 PM

## 2017-10-06 NOTE — ED Notes (Signed)
Subclavian access unsuccessful. MD attempting femoral line placement.

## 2017-10-06 NOTE — Consult Note (Signed)
Referring Provider: ER Primary Care Physician:  Patient, [name] Primary Gastroenterologist:  undersigned  Reason for Consultation:  GI bleed  HPI: Patrick [name] is a 67 y.o. male with past medical history of colon cancer status post colon resection with colostomy in May 2018, history of small bowel obstruction in 2009, history of coronary artery disease , with history of cardiomyopathy with EF of 30-35%,brought into the hospital for altered mental status. Patient was noted to have a dark colored liquid in ostomy bag.hemoglobin down to 7.7. GI is consulted for further evaluation of GI bleed.  Patient seen and examined at bedside in the emergency room. Not able to obtain any history from patient. Chart reviewed. Patient with hemoglobin of 7.7. Patient with chronic anemia dated back to 2016. Patient with platelet count of only 32.  Past Medical History:  Diagnosis Date  . Arthritis    "right hand; right knee" (06/19/2014)  . CAD (coronary artery disease)    2v CAD with subtotal occlusion of small co-dominant RCA and borderline lesion in moderate-sized OM-1  . CHF (congestive heart failure) ([facility])    Preserved EF  . Coronary artery disease involving native coronary artery of native heart with angina pectoris ([place]) 09/11/2016  . Degenerative joint disease of knee, right   . Dysrhythmia   . Enlarged prostate   . Hyperlipidemia   . Hypertension   . Pneumonia 05/2014  . Prediabetes 10/03/2014  . Scrotal edema 04/03/2015  . SMALL BOWEL OBSTRUCTION, HX OF 08/21/2007   Annotation: with narrowing in the ileocecal region Qualifier: Diagnosis of  By: Hassell Done FNP, Tori Milks      Past Surgical History:  Procedure Laterality Date  . CARDIAC CATHETERIZATION N/A 02/16/2016   Procedure: Right/Left Heart Cath and Coronary Angiography;  Surgeon: Jolaine Artist, MD;  Location: [facility];  Service: Cardiovascular;  Laterality: N/A;  . COLONOSCOPY N/A 04/01/2016   Procedure: COLONOSCOPY;  Surgeon:  Leighton Ruff, MD;  Location: [facility];  Service: Endoscopy;  Laterality: N/A;  . INGUINAL HERNIA REPAIR Left 1990's  . IR GENERIC HISTORICAL  07/05/2016   IR RADIOLOGIST EVAL & MGMT 07/05/2016 Corrie Mckusick, DO [facility] INTERV RAD  . IR RADIOLOGIST EVAL & MGMT  03/29/2017  . XI ROBOT ABDOMINAL PERINEAL RESECTION N/A 09/28/2016   Procedure: XI ROBOT ABDOMINOPERINEAL RESECTION WITH PERMANENT COLOSTOMY ERAS PATHWAY;  Surgeon: Michael Boston, MD;  Location: [facility];  Service: General;  Laterality: N/A;    Prior to Admission medications   Medication Sig Start Date End Date Taking? Authorizing Provider  allopurinol (ZYLOPRIM) 300 MG tablet take 1 tablet by mouth once daily 04/03/17  Yes Mayo, Pete Pelt, MD  apixaban (ELIQUIS) 5 MG TABS tablet Take 1 tablet (5 mg total) by mouth 2 (two) times daily. 01/26/17  Yes Minus Breeding, MD  atorvastatin (LIPITOR) 10 MG tablet Take 1 tablet by mouth daily. 08/19/17  Yes [provider]  buPROPion (WELLBUTRIN) 100 MG tablet take 1 tablet by mouth twice a day 03/14/17  Yes Mikell, Jeani Sow, MD  carvedilol (COREG) 25 MG tablet take 1 tablets by mouth twice a day 02/21/17  Yes Minus Breeding, MD  cefTRIAXone (ROCEPHIN) 1 g injection Inject 1 g into the muscle daily. For 3 days. Started 10-05-17   Yes [provider]  clotrimazole-betamethasone (LOTRISONE) cream Apply 1 application topically 2 (two) times daily. 08/15/17  Yes [provider]  ferrous sulfate 325 (65 FE) MG tablet Take 1 tablet (325 mg total) by mouth 2 (  two) times daily with a meal. 12/26/16  Yes Mayo, Pete Pelt, MD  finasteride (PROSCAR) 5 MG tablet take 1 tablet by mouth once daily 03/14/17  Yes Mikell, Jeani Sow, MD  losartan (COZAAR) 50 MG tablet Take 1 tablet (50 mg total) by mouth daily. 12/09/16  Yes Mayo, Pete Pelt, MD  Multiple Vitamins-Minerals (MULTIVITAMIN WITH MINERALS) tablet Take 1 tablet by mouth daily. Men's 50+ Multivitamin   Yes [provider]   Omega-3 1000 MG CAPS Take 2 capsules (2,000 mg total) by mouth 2 (two) times daily. 12/09/16  Yes Mayo, Pete Pelt, MD  potassium chloride SA (K-DUR,KLOR-CON) 20 MEQ tablet Take 2 tablets (40 mEq total) by mouth daily. And 20 meq by mouth in the evening 05/26/17  Yes Lockamy, Timothy, DO  tamsulosin (FLOMAX) 0.4 MG CAPS capsule Take 0.4 mg by mouth daily.    Yes [provider]  torsemide (DEMADEX) 20 MG tablet Take 20 mg by mouth 2 (two) times daily.   Yes [provider]  traZODone (DESYREL) 100 MG tablet Take 3 tablets (300 mg total) by mouth at bedtime. 01/02/17  Yes Mayo, Pete Pelt, MD  acetaminophen (TYLENOL) 500 MG tablet Take 1,000 mg by mouth every 6 (six) hours as needed (for pain/fever/headaches.).     [provider]  Capsaicin-Menthol-Methyl Sal (CAPSAICIN-METHYL SAL-MENTHOL) 0.025-1-12 % CREA Apply to left and or right leg as needed once to twice a day for pain Patient not taking: Reported on 10/06/2017 07/25/17   Landis Martins, DPM  lovastatin (MEVACOR) 20 MG tablet Take 1 tablet (20 mg total) by mouth at bedtime. Patient not taking: Reported on 10/06/2017 12/26/16   Mayo, Pete Pelt, MD  methocarbamol (ROBAXIN) 750 MG tablet Take 1 tablet (750 mg total) by mouth 4 (four) times daily as needed (use for muscle cramps/pain). 09/28/16   Michael Boston, MD  nitroGLYCERIN (NITROSTAT) 0.4 MG SL tablet place 1 tablet under the tongue if needed every 5 minutes for chest pain for 3 doses IF NO RELIEF AFTER 1ST DOSE CALL 911. 05/10/17   Mayo, Pete Pelt, MD  oxyCODONE-acetaminophen (PERCOCET/ROXICET) 5-325 MG tablet Take 1 tablet by mouth every 6 (six) hours as needed for severe pain.    [provider]    Scheduled Meds: . pantoprazole (PROTONIX) IV  40 mg Intravenous Q12H   Continuous Infusions: . sodium chloride    . famotidine (PEPCID) IV    . norepinephrine (LEVOPHED) Adult infusion    . piperacillin-tazobactam    . piperacillin-tazobactam (ZOSYN)  IV    .   sodium bicarbonate  infusion 1000 mL    . vancomycin     PRN Meds:.sodium chloride  Allergies as of 10/06/2017 - Review Complete 10/06/2017  Allergen Reaction Noted  . Nsaids  10/07/2016    Family History  Problem Relation Age of Onset  . Hypertension Mother   . Diabetes Mother   . Stroke Mother   . Cancer Father 48       Prostate  . Dementia Father   . COPD Sister   . Arthritis Sister   . Edema Sister     Social History   Socioeconomic History  . Marital status: Single    Spouse name: Not on file  . Number of children: Not on file  . Years of education: Not on file  . Highest education level: Not on file  Occupational History  . Not on file  Social Needs  . Financial resource strain: Not on file  . Food  insecurity:    Worry: Not on file    Inability: Not on file  . Transportation needs:    Medical: Not on file    Non-medical: Not on file  Tobacco Use  . Smoking status: Former Smoker    Years: 5.00    Types: Pipe, Landscape architect  . Smokeless tobacco: Never Used  . Tobacco comment: 06/19/2014 "stopped smoking in ~ 2014; used to smoke a pipe or cigar a couple times/month"  Substance and Sexual Activity  . Alcohol use: No  . Drug use: No  . Sexual activity: Never  Lifestyle  . Physical activity:    Days per week: Not on file    Minutes per session: Not on file  . Stress: Not on file  Relationships  . Social connections:    Talks on phone: Not on file    Gets together: Not on file    Attends religious service: Not on file    Active member of club or organization: Not on file    Attends meetings of clubs or organizations: Not on file    Relationship status: Not on file  . Intimate partner violence:    Fear of current or ex partner: Not on file    Emotionally abused: Not on file    Physically abused: Not on file    Forced sexual activity: Not on file  Other Topics Concern  . Not on file  Social History Narrative   Single, lives w/his sister of whom he is  caregiver    Review of Systems: not able to obtain  Physical Exam: Vital signs: Vitals:   10/06/17 1515 10/06/17 1545  BP: (!) 99/51 (!) 101/57  Pulse: 62 66  Resp: 17   Temp: (!) 93 F (33.9 C) (!) 93.6 F (34.2 C)  SpO2: 97% 97%     General:  confused  Well-developed, well-nourished, lethargic Lungs:  Clear throughout to auscultation.   No wheezes, crackles, or rhonchi. No acute distress. Heart:  Regular rate and rhythm; no murmurs, clicks, rubs,  or gallops. Abdomen: soft, mild distended, not able to appreciate tenderness. Dark green liquid in the ostomy bag. No evidence of black tarry stool or bright red color blood in the ostomy bag. LE : trace edema  Rectal:  Deferred  GI:  Lab Results: Recent Labs    10/06/17 1123  WBC 4.2  HGB 7.7*  HCT 23.4*  PLT 32*   BMET Recent Labs    10/06/17 1123  NA 137  K 5.7*  CL 109  CO2 10*  GLUCOSE 83  BUN 143*  CREATININE 14.42*  CALCIUM 8.2*   LFT Recent Labs    10/06/17 1123  PROT 6.9  ALBUMIN 2.6*  AST 9*  ALT 12*  ALKPHOS 100  BILITOT 1.5*   PT/INR Recent Labs    10/06/17 1123  LABPROT 25.0*  INR 2.28     Studies/Results: Dg Chest Port 1 View  Result Date: 10/06/2017 CLINICAL DATA:  Encounter for central line placement. EXAM: PORTABLE CHEST 1 VIEW COMPARISON:  Radiograph of same day. FINDINGS: Stable cardiomegaly. Hypoinflation of the lungs is noted with mild bibasilar subsegmental atelectasis. Interval placement of left internal jugular dialysis catheter with distal tip in expected position of the SVC. No pneumothorax or significant pleural effusion is noted. Bony thorax is unremarkable. IMPRESSION: Interval placement of left internal jugular dialysis catheter with distal tip in expected position of SVC. No pneumothorax is noted. Hypoinflation of the lungs with mild bibasilar subsegmental atelectasis. Electronically  Signed   By: Marijo Conception, M.D.   On: 10/06/2017 15:23   Dg Chest Portable 1  View  Result Date: 10/06/2017 CLINICAL DATA:  67 year old male with a history of hypotension EXAM: PORTABLE CHEST 1 VIEW COMPARISON:  05/23/2017, 10/08/2016 FINDINGS: Cardiomediastinal silhouette unchanged, likely accentuated by apical lordotic positioning. Persisting cardiomegaly. No pneumothorax.  No large pleural effusion.  Low lung volumes. IMPRESSION: Redemonstration of cardiomegaly. The accentuated appearance is favored to be secondary to the apical lordotic positioning, however, new pericardial effusion not excluded. If there is concern for pericardial effusion, recommend correlation with ECHO. No evidence of lobar pneumonia or overt edema. Electronically Signed   By: Corrie Mckusick D.O.   On: 10/06/2017 12:15    Impression/Plan: - dark colored liquid in ostomy bag with drop in hemoglobin. Concern for GI bleed.patient has dark green liquid in ostomy bag. I Did not see any black tarry stool or bright red blood in the ostomy  - acute on chronic anemia.patient with anemia dated back to 2016. - Thrombocytopenia. Platelet count 32. - Acute kidney injury with creatinine of 14.42. - Urosepsis - Encephalopathy. Most likely from uremia. -  history of colon cancer status post colon resection with colostomy in May 2018,  Recommendations -------------------------- - Continue conservative management for now. Repeat CBC. Recommend PRBC and platelet transfusions as needed. - continue IV PPI for now. - Patient with multiple medical issues at this time. GI will follow.    LOS: 0 days   Otis Brace  MD, FACP 10/06/2017, 4:10 PM  Contact #  [phone]

## 2017-10-06 NOTE — ED Notes (Signed)
Spoke with patient's sister Logyn Kendrick who verbalized consent for blood transfusion over the phone. Witnessed by Dr. Sabra Heck who also obtained verbal consent.

## 2017-10-06 NOTE — Progress Notes (Signed)
Patient sister Eligha Kmetz [phone] (also a resident at [facility]). Understands severity of condition. Patient has previous MOST form uploaded in EMR that states he would not want Intubation. Sister says she is not aware of this, however, understands that we are providing extensive care and would like to continue for 48 hours, if during this time patient decompensates would not want CPR, Defib/Cardioversion. Will continue current management, Code Status updated in EMR.   Hayden Pedro, AGACNP-BC [facility]  Pgr: [phone]  PCCM Pgr: [phone]

## 2017-10-06 NOTE — ED Notes (Signed)
Pt cleaned and brief changed

## 2017-10-06 NOTE — Progress Notes (Signed)
Pharmacy Antibiotic Note  Patrick [name] is a 67 y.o. male admitted on 10/06/2017 with sepsis.    Plan: Zosyn 3.375 gm iv q12h Vanc 2000 gm x 1  Monitor renal fx cx vt prn F/U HD plans vs CRRT     Temp (24hrs), Avg:92 F (33.3 C), Min:87.1 F (30.6 C), Max:93.6 F (34.2 C)  Recent Labs  Lab 10/06/17 1123 10/06/17 1205  WBC 4.2  --   CREATININE 14.42*  --   LATICACIDVEN  --  0.30*    CrCl cannot be calculated (Unknown ideal weight.).    Allergies  Allergen Reactions  . Nsaids     Kidney disease   Levester Fresh, PharmD, BCPS, BCCCP Clinical Pharmacist Clinical phone for 10/06/2017 from 1430 [phone]: [phone] If after 2300, please call main pharmacy at: [phone] 10/06/2017 4:03 PM

## 2017-10-06 NOTE — Progress Notes (Signed)
ABG showed acidosis, and pt not protecting airway.  Intubated.  Has altered mental status, AKI, thrombocytopenia.  ?TTP.  Will check DIC panel.  D/w renal >> will arrange for CRRT.  Chesley Mires, MD [facility] Pulmonary/Critical Care 10/06/2017, 7:07 PM

## 2017-10-06 NOTE — Progress Notes (Signed)
Pt admitted to 26M 11 from ED at 1800. Pt w/ very questionable ability to sustain efficient respirations. ABG procured immediately and result indicating stat intubation. Vasopressors adjusted for lability, bolus of NS 400 ml per instruction given. Pt still to recve PRBC.

## 2017-10-06 NOTE — ED Notes (Signed)
Blood transfusion complete. Will give second unit and then draw blood work 2 hours after completion of that unit.

## 2017-10-06 NOTE — ED Notes (Signed)
Patrick [name] attempting trialysis catheter

## 2017-10-06 NOTE — ED Notes (Signed)
Ultrasound to be done bedside

## 2017-10-06 NOTE — ED Notes (Signed)
Pt pulled out IV from right hand; bleeding found on pt and floor. approx 50 ml lost. Pt cleaned and sheets changed.

## 2017-10-07 ENCOUNTER — Inpatient Hospital Stay (HOSPITAL_COMMUNITY): Payer: Medicare Other

## 2017-10-07 DIAGNOSIS — R579 Shock, unspecified: Secondary | ICD-10-CM

## 2017-10-07 DIAGNOSIS — I340 Nonrheumatic mitral (valve) insufficiency: Secondary | ICD-10-CM

## 2017-10-07 LAB — CBC
HCT: 20.6 % — ABNORMAL LOW (ref 39.0–52.0)
HCT: 23.5 % — ABNORMAL LOW (ref 39.0–52.0)
HEMATOCRIT: 23.7 % — AB (ref 39.0–52.0)
HEMATOCRIT: 23.6 % — AB (ref 39.0–52.0)
HEMOGLOBIN: 6.9 g/dL — AB (ref 13.0–17.0)
HEMOGLOBIN: 8.1 g/dL — AB (ref 13.0–17.0)
HEMOGLOBIN: 7.9 g/dL — AB (ref 13.0–17.0)
Hemoglobin: 7.8 g/dL — ABNORMAL LOW (ref 13.0–17.0)
MCH: 26.4 pg (ref 26.0–34.0)
MCH: 27.1 pg (ref 26.0–34.0)
MCH: 26.4 pg (ref 26.0–34.0)
MCH: 26.4 pg (ref 26.0–34.0)
MCHC: 33.5 g/dL (ref 30.0–36.0)
MCHC: 34.2 g/dL (ref 30.0–36.0)
MCHC: 33.5 g/dL (ref 30.0–36.0)
MCHC: 33.2 g/dL (ref 30.0–36.0)
MCV: 78.9 fL (ref 78.0–100.0)
MCV: 79.3 fL (ref 78.0–100.0)
MCV: 78.9 fL (ref 78.0–100.0)
MCV: 79.4 fL (ref 78.0–100.0)
PLATELETS: 41 10*3/uL — AB (ref 150–400)
Platelets: 61 10*3/uL — ABNORMAL LOW (ref 150–400)
Platelets: 48 10*3/uL — ABNORMAL LOW (ref 150–400)
Platelets: 45 10*3/uL — ABNORMAL LOW (ref 150–400)
RBC: 2.61 MIL/uL — AB (ref 4.22–5.81)
RBC: 2.99 MIL/uL — AB (ref 4.22–5.81)
RBC: 2.99 MIL/uL — ABNORMAL LOW (ref 4.22–5.81)
RBC: 2.96 MIL/uL — ABNORMAL LOW (ref 4.22–5.81)
RDW: 21 % — ABNORMAL HIGH (ref 11.5–15.5)
RDW: 20.3 % — ABNORMAL HIGH (ref 11.5–15.5)
RDW: 20 % — ABNORMAL HIGH (ref 11.5–15.5)
RDW: 20 % — AB (ref 11.5–15.5)
WBC: 5 10*3/uL (ref 4.0–10.5)
WBC: 4.9 10*3/uL (ref 4.0–10.5)
WBC: 4.8 10*3/uL (ref 4.0–10.5)
WBC: 4.2 10*3/uL (ref 4.0–10.5)

## 2017-10-07 LAB — RENAL FUNCTION PANEL
ANION GAP: 16 — AB (ref 5–15)
Albumin: 2.2 g/dL — ABNORMAL LOW (ref 3.5–5.0)
Albumin: 2.1 g/dL — ABNORMAL LOW (ref 3.5–5.0)
Anion gap: 17 — ABNORMAL HIGH (ref 5–15)
BUN: 109 mg/dL — ABNORMAL HIGH (ref 6–20)
BUN: 71 mg/dL — AB (ref 6–20)
CHLORIDE: 104 mmol/L (ref 101–111)
CHLORIDE: 90 mmol/L — AB (ref 101–111)
CO2: 19 mmol/L — ABNORMAL LOW (ref 22–32)
CO2: 31 mmol/L (ref 22–32)
CREATININE: 7.25 mg/dL — AB (ref 0.61–1.24)
Calcium: 7.3 mg/dL — ABNORMAL LOW (ref 8.9–10.3)
Calcium: 6.5 mg/dL — ABNORMAL LOW (ref 8.9–10.3)
Creatinine, Ser: 10.76 mg/dL — ABNORMAL HIGH (ref 0.61–1.24)
GFR calc non Af Amer: 4 mL/min — ABNORMAL LOW (ref >60)
GFR calc non Af Amer: 7 mL/min — ABNORMAL LOW (ref >60)
GFR, EST AFRICAN AMERICAN: 5 mL/min — AB (ref >60)
GFR, EST AFRICAN AMERICAN: 8 mL/min — AB (ref >60)
GLUCOSE: 83 mg/dL (ref 65–99)
Glucose, Bld: 81 mg/dL (ref 65–99)
POTASSIUM: 3.9 mmol/L (ref 3.5–5.1)
Phosphorus: 7.9 mg/dL — ABNORMAL HIGH (ref 2.5–4.6)
Phosphorus: 4.6 mg/dL (ref 2.5–4.6)
Potassium: 3.2 mmol/L — ABNORMAL LOW (ref 3.5–5.1)
Sodium: 139 mmol/L (ref 135–145)
Sodium: 138 mmol/L (ref 135–145)

## 2017-10-07 LAB — PREPARE FRESH FROZEN PLASMA

## 2017-10-07 LAB — BPAM FFP
Blood Product Expiration Date: 201905102359
ISSUE DATE / TIME: 201905102229
UNIT TYPE AND RH: 6200

## 2017-10-07 LAB — GLUCOSE, CAPILLARY
GLUCOSE-CAPILLARY: 103 mg/dL — AB (ref 65–99)
GLUCOSE-CAPILLARY: 67 mg/dL (ref 65–99)
Glucose-Capillary: 68 mg/dL (ref 65–99)
Glucose-Capillary: 67 mg/dL (ref 65–99)
Glucose-Capillary: 57 mg/dL — ABNORMAL LOW (ref 65–99)
Glucose-Capillary: 77 mg/dL (ref 65–99)
Glucose-Capillary: 176 mg/dL — ABNORMAL HIGH (ref 65–99)

## 2017-10-07 LAB — POCT I-STAT 3, ART BLOOD GAS (G3+)
Acid-base deficit: 5 mmol/L — ABNORMAL HIGH (ref 0.0–2.0)
Bicarbonate: 18.4 mmol/L — ABNORMAL LOW (ref 20.0–28.0)
O2 SAT: 99 %
PCO2 ART: 26.2 mmHg — AB (ref 32.0–48.0)
PH ART: 7.454 — AB (ref 7.350–7.450)
PO2 ART: 135 mmHg — AB (ref 83.0–108.0)
Patient temperature: 36.4
TCO2: 19 mmol/L — AB (ref 22–32)

## 2017-10-07 LAB — PREPARE RBC (CROSSMATCH)

## 2017-10-07 LAB — ECHOCARDIOGRAM COMPLETE: Weight: 3033.53 oz

## 2017-10-07 LAB — TROPONIN I: Troponin I: 0.55 ng/mL (ref <0.03)

## 2017-10-07 LAB — PHOSPHORUS: PHOSPHORUS: 8 mg/dL — AB (ref 2.5–4.6)

## 2017-10-07 LAB — MAGNESIUM: Magnesium: 1.5 mg/dL — ABNORMAL LOW (ref 1.7–2.4)

## 2017-10-07 LAB — CORTISOL: Cortisol, Plasma: 10.2 ug/dL

## 2017-10-07 LAB — SODIUM, URINE, RANDOM: Sodium, Ur: 87 mmol/L

## 2017-10-07 MED ORDER — DEXTROSE 50 % IV SOLN
INTRAVENOUS | Status: AC
  Filled 2017-10-07: qty 50

## 2017-10-07 MED ORDER — DEXTROSE 50 % IV SOLN
50.0000 mL | Freq: Once | INTRAVENOUS | Status: AC
  Administered 2017-10-07: 50 mL via INTRAVENOUS
  Filled 2017-10-07: qty 50

## 2017-10-07 MED ORDER — DEXTROSE-NACL 5-0.45 % IV SOLN
INTRAVENOUS | Status: DC
  Administered 2017-10-07: 13:00:00 via INTRAVENOUS

## 2017-10-07 MED ORDER — DEXTROSE 50 % IV SOLN
INTRAVENOUS | Status: AC
  Administered 2017-10-07: 50 mL
  Filled 2017-10-07: qty 50

## 2017-10-07 MED ORDER — MAGNESIUM SULFATE 2 GM/50ML IV SOLN
2.0000 g | Freq: Once | INTRAVENOUS | Status: AC
  Administered 2017-10-07: 2 g via INTRAVENOUS
  Filled 2017-10-07: qty 50

## 2017-10-07 MED ORDER — SODIUM CHLORIDE 0.9 % IV SOLN
Freq: Once | INTRAVENOUS | Status: AC
  Administered 2017-10-07: 05:00:00 via INTRAVENOUS

## 2017-10-07 MED ORDER — POTASSIUM CHLORIDE 20 MEQ/15ML (10%) PO SOLN
40.0000 meq | Freq: Once | ORAL | Status: AC
  Administered 2017-10-07: 40 meq via ORAL
  Filled 2017-10-07: qty 30

## 2017-10-07 MED ORDER — DEXTROSE 50 % IV SOLN: 1.0000 | Freq: Once | INTRAVENOUS | Status: AC

## 2017-10-07 MED ORDER — SODIUM CHLORIDE 0.9% FLUSH: 10.0000 mL | INTRAVENOUS | Status: DC | PRN

## 2017-10-07 MED ORDER — DEXTROSE 50 % IV SOLN
INTRAVENOUS | Status: AC
  Administered 2017-10-07: 50 mL via INTRAVENOUS
  Filled 2017-10-07: qty 50

## 2017-10-07 MED ORDER — SODIUM CHLORIDE 0.9% FLUSH
10.0000 mL | Freq: Two times a day (BID) | INTRAVENOUS | Status: DC
  Administered 2017-10-07: 20 mL
  Administered 2017-10-08 (×2): 10 mL
  Administered 2017-10-09: 30 mL
  Administered 2017-10-09 – 2017-10-10 (×3): 10 mL
  Administered 2017-10-11: 20 mL
  Administered 2017-10-11 – 2017-10-26 (×23): 10 mL

## 2017-10-07 MED ORDER — CHLORHEXIDINE GLUCONATE CLOTH 2 % EX PADS
6.0000 | MEDICATED_PAD | Freq: Every day | CUTANEOUS | Status: DC
  Administered 2017-10-07 – 2017-10-21 (×15): 6 via TOPICAL

## 2017-10-07 MED ORDER — DEXTROSE 50 % IV SOLN
25.0000 mL | Freq: Once | INTRAVENOUS | Status: AC
  Administered 2017-10-07: 25 mL via INTRAVENOUS

## 2017-10-07 NOTE — Progress Notes (Addendum)
Initial Nutrition Assessment  DOCUMENTATION CODES:  Not applicable  INTERVENTION:  If unable to extubated within 24-48 hrs, Would recommend initiation of nutrition support given high requirements w/ CRRT  Vital AF 1.2 at goal rate of 55 ml/h (1320 ml per day) and Prostat 30 ml BID to provide 1784 kcals, 129 gm protein, 1071 ml free water daily.  NUTRITION DIAGNOSIS:  Inadequate oral intake related to inability to eat as evidenced by NPO status.  GOAL:  Patient will meet greater than or equal to 90% of their needs  MONITOR:  Diet advancement, Vent status, Labs, Weight trends, I & O's  REASON FOR ASSESSMENT:  Ventilator    ASSESSMENT:  67 y/o male PMHx CAD, Afib, HF (EF 30-35%), remote colon cancer s/p colostomy, CKD3-4, WC bound, HTN/HLD. ALF resident. Presents after being found w/ AMS and abnormal renal labs. Worked up for AKI, acidosis, anemia. Intubated for airway protection. Started on CRRT.   Pt intubated, lethargic on RD arrival. He is undergoing CRRT. Information obtained from chart. Per chart, his weight has fluctuated between 195-205 for the last 6-9 months. He appears to be ~5 lbs below this UBW range on arrival. Nephrology notes pt was volume depleted.   There is no information regarding fluid/meal intake.   Physical Exam: Mild-moderate temporal muscle wasting. No other discernible areas of fat/muscle wasting.   Will leave TF recs in event patient is unable to be extubated in next 24-48hrs.   Patient is currently intubated on ventilator support MV: 10.9 L/min Temp (24hrs), Avg:95.5 F (35.3 C), Min:87.1 F (30.6 C), Max:98.1 F (36.7 C)  Propofol: None  Labs BG: 65-110, BUN/Creat 109/10.76, PHos 7.9,K now WDL, Mag 1.5 Meds: PPI, IVF Pressor Support: Levophed Infusions: iv ABX, ivf, bicarb Sedation: Prn Fentanyl to maintain RASS  Recent Labs  Lab 10/06/17 1123 10/06/17 2007 10/07/17 0307 10/07/17 0345  NA 137 136  137  --  139  K 5.7* 5.7*  5.7*  --   3.9  CL 109 111  111  --  104  CO2 10* <7*  9*  --  19*  BUN 143* 139*  135*  --  109*  CREATININE 14.42* 13.82*  13.60*  --  10.76*  CALCIUM 8.2* 7.7*  7.8*  --  7.3*  MG  --  1.7 1.5*  --   PHOS  --  11.2* 8.0* 7.9*  GLUCOSE 83 99  103*  --  81  \ NUTRITION - FOCUSED PHYSICAL EXAM:   Most Recent Value  Orbital Region  No depletion  Upper Arm Region  No depletion  Thoracic and Lumbar Region  No depletion  Buccal Region  No depletion  Temple Region  Mild depletion  Clavicle Bone Region  No depletion  Clavicle and Acromion Bone Region  No depletion  Scapular Bone Region  No depletion  Dorsal Hand  No depletion  Patellar Region  No depletion  Anterior Thigh Region  No depletion  Posterior Calf Region  No depletion  Edema (RD Assessment)  None       Diet Order:   Diet Order           Diet NPO time specified  Diet effective now         EDUCATION NEEDS:  No education needs have been identified at this time  Skin:  Skin Assessment: Reviewed RN Assessment  Last BM:  5/11-colostomy  Height:  Ht Readings from Last 1 Encounters:  08/29/17 5\' 8"  (1.727 m)   Weight:  Wt Readings from Last 1 Encounters:  10/07/17 189 lb 9.5 oz (86 kg)   Wt Readings from Last 10 Encounters:  10/07/17 189 lb 9.5 oz (86 kg)  08/29/17 215 lb (97.5 kg)  05/26/17 206 lb 9.6 oz (93.7 kg)  03/29/17 195 lb (88.5 kg)  03/07/17 201 lb 12.8 oz (91.5 kg)  02/22/17 196 lb 9.6 oz (89.2 kg)  01/26/17 196 lb (88.9 kg)  01/09/17 203 lb (92.1 kg)  12/22/16 188 lb 14.4 oz (85.7 kg)  12/09/16 189 lb (85.7 kg)   Ideal Body Weight:  70 kg  BMI:  Body mass index is 28.83 kg/m.  Estimated Nutritional Needs:  Kcal:  1795 kcals (PSU 2003b) Protein:  129-146 (1.5-1.7 g/kg bw) Fluid:  Per MD goals  Burtis Junes RD, LDN, CNSC Clinical Nutrition Available Tues-Sat via Pager: [phone] 10/07/2017 10:55 AM

## 2017-10-07 NOTE — Progress Notes (Signed)
PULMONARY / CRITICAL CARE MEDICINE   Name: Patrick [name] MRN: [id number] DOB: 1951/03/06    ADMISSION DATE:  10/06/2017 CONSULTATION DATE:  5/10  REFERRING MD:  Sabra Heck   BRIEF:   This is a 67 yo chronically ill appearing wm.sig h/o CAD, svr ICM w/ EF 30-35%, remote colon cancer w/ perm end colostomy back in 2018/may. Also has h/o stage III-IV CKD. Resides at SNF (not clear when or why this started). He is WC bound. Presents to ED 5/10 after being found at SNF w/ altered LOC and several lab abnormalities including: cr 13.35,  K 5.7, and + AG metabolic acidosis w/; nml LA.  hgb 7.5 hypoglycemia and hypotension w/ SBP in 70s.  ER course: IVFs, 1 unit PRBCs, cultures obtained and empiric abx Remained hypotensive w/ best BP in 90s. Not able to pass CVL. PCCM asked to see for critical care services as well as to obtain access for anticipated need for urgent HD.   Prior platelent hx Results for Patrick, [name] (MRN [id number]) as of 10/07/2017 12:08  Ref. Range 05/23/2017 13:32 05/24/2017 06:04 05/25/2017 09:22 05/26/2017 06:48 10/06/2017 11:23  Platelets Latest Ref Range: 150 - 400 K/uL 111 (L) 123 (L) 114 (L) 109 (L) 32 (L)    PAST MEDICAL HISTORY :  He  has a past medical history of Arthritis, CAD (coronary artery disease), CHF (congestive heart failure) ([place]), Coronary artery disease involving native coronary artery of native heart with angina pectoris ([place]) (09/11/2016), Degenerative joint disease of knee, right, Dysrhythmia, Enlarged prostate, Hyperlipidemia, Hypertension, Pneumonia (05/2014), Prediabetes (10/03/2014), Scrotal edema (04/03/2015), and SMALL BOWEL OBSTRUCTION, HX OF (08/21/2007).  PAST SURGICAL HISTORY: He  has a past surgical history that includes Inguinal hernia repair (Left, 1990's); Cardiac catheterization (N/A, 02/16/2016); Colonoscopy (N/A, 04/01/2016); ir generic historical (07/05/2016); XI abdominal perineal resection (N/A, 09/28/2016); and IR Radiologist Eval & Mgmt  (03/29/2017).  EVENTS 10/06/2017 - admitted, intubated, On CRRT Echocardiogram 5/10 Blood cultures 5/10 Urine culture 5/10 Vancomycin 5/10 Zosyn 5/10 Left IJ dialysis catheter 5/10  SUBJECTIVE/OVERNIGHT/INTERVAL HX 10/07/17 - On levophed 34mcg On vent 30% fio2, prn sedation Not making much urine. Can follow command but will get agitated. No blood in colostomy per GI and signed off  / SP PRBC - plat now 48. [place] command this AM  Results for Patrick, [name] (MRN [id number]) as of 10/07/2017 12:08  Ref. Range 10/06/2017 20:07  D-Dimer, Quant Latest Ref Range: 0.00 - 0.50 ug/mL-FEU 1.84 (H)  Fibrinogen Latest Ref Range: 210 - 475 mg/dL 356  Prothrombin Time Latest Ref Range: 11.4 - 15.2 seconds 24.0 (H)  INR Unknown 2.17  APTT Latest Ref Range: 24 - 36 seconds 53 (H)  Results for Patrick, [name] (MRN [id number]) as of 10/07/2017 12:08  Ref. Range 10/06/2017 20:07  Smear Review Unknown NO SCHISTOCYTES SEEN    VITAL SIGNS: BP (!) 99/51   Pulse 67   Temp 97.9 F (36.6 C)   Resp (!) 7   Wt 86 kg (189 lb 9.5 oz)   SpO2 99%   BMI 28.83 kg/m    HEMODYNAMICS:    VENTILATOR SETTINGS: Vent Mode: PRVC FiO2 (%):  [30 %-100 %] 30 % Set Rate:  [22 bmp] 22 bmp Vt Set:  [530 mL-600 mL] 600 mL PEEP:  [5 cmH20] 5 cmH20 Plateau Pressure:  [16 cmH20-17 cmH20] 17 cmH20  INTAKE / OUTPUT: I/O last 3 completed shifts: In: 7481.1 [I.V.:2751.1; Blood:580; Other:450; IV [id number]] Out: 1941 [Urine:19; Other:1560; Stool:300]  PHYSICAL EXAMINATION:  General Appearance:    Looks criticall ill  Head:    Normocephalic, without obvious abnormality, atraumatic  Eyes:    PERRL - yes, conjunctiva/corneas - clear      Ears:    Normal external ear canals, both ears  Nose:   NG tube - no  Throat:  ETT TUBE - yes , OG tube - yes  Neck:   Supple,  No enlargement/tenderness/nodules     Lungs:     Clear to auscultation bilaterally, Ventilator   Synchrony - yes  Chest wall:    No deformity  Heart:    S1  and S2 normal, no murmur, CVP - no.  Pressors - yes levophed 92mcg  Abdomen:     Soft, no masses, no organomegaly  Genitalia:    Not done  Rectal:   not done  Extremities:   Extremities- intact     Skin:   Intact in exposed areas .     Neurologic:   Sedation - prn -> RASS - -3 . Moves all 4s - yes. CAM-ICU - maybe when he is awake . Orientation - na    PULMONARY Recent Labs  Lab 10/06/17 1818 10/06/17 2029 10/06/17 2048 10/07/17 0354  PHART 7.080* 7.127* 7.125* 7.454*  PCO2ART 28.6* 24.5* 30.7* 26.2*  PO2ART 114* 345.0* 53.0* 135.0*  HCO3 8.1* 8.3* 10.1* 18.4*  TCO2  --  9* 11* 19*  O2SAT 96.6 100.0 76.0 99.0    CBC Recent Labs  Lab 10/06/17 2007 10/07/17 0345 10/07/17 1042  HGB 8.2* 6.9* 8.1*  HCT 24.6* 20.6* 23.7*  WBC 8.1 5.0 4.9  PLT 47*  52* 61* 48*    COAGULATION Recent Labs  Lab 10/06/17 1123 10/06/17 2007  INR 2.28 2.17    CARDIAC   Recent Labs  Lab 10/06/17 2007 10/07/17 0307  TROPONINI 0.33* 0.55*   No results for input(s): PROBNP in the last 168 hours.   CHEMISTRY Recent Labs  Lab 10/06/17 1123 10/06/17 2007 10/07/17 0307 10/07/17 0345  NA 137 136  137  --  139  K 5.7* 5.7*  5.7*  --  3.9  CL 109 111  111  --  104  CO2 10* <7*  9*  --  19*  GLUCOSE 83 99  103*  --  81  BUN 143* 139*  135*  --  109*  CREATININE 14.42* 13.82*  13.60*  --  10.76*  CALCIUM 8.2* 7.7*  7.8*  --  7.3*  MG  --  1.7 1.5*  --   PHOS  --  11.2* 8.0* 7.9*   Estimated Creatinine Clearance: 6.9 mL/min (A) (by C-G formula based on SCr of 10.76 mg/dL (H)).   LIVER Recent Labs  Lab 10/06/17 1123 10/06/17 2007 10/07/17 0345  AST 9* 11*  9*  --   ALT 12* 11*  12*  --   ALKPHOS 100 94  94  --   BILITOT 1.5* 1.8*  1.6*  --   PROT 6.9 6.6  6.5  --   ALBUMIN 2.6* 2.5*  2.3* 2.2*  INR 2.28 2.17  --      INFECTIOUS Recent Labs  Lab 10/06/17 1205 10/06/17 2007  LATICACIDVEN 0.30*  --   PROCALCITON  --  0.27     ENDOCRINE CBG (last  3)  Recent Labs    10/07/17 0340 10/07/17 0414 10/07/17 0826  GLUCAP 68 103* 67         IMAGING x48h  - image(s) personally visualized  -  highlighted in bold Dg Abd 1 View  Result Date: 10/06/2017 CLINICAL DATA:  Orogastric tube placement. EXAM: ABDOMEN - 1 VIEW COMPARISON:  06/01/2016. FINDINGS: Orogastric tube tip in the proximal stomach and side hole in the proximal to mid stomach. Gaseous distention of the stomach. Again demonstrated are 3 large gallstones in the gallbladder measuring up to 2.6 cm in maximum diameter each. No bowel gas visualized except for the gas in the stomach. Patchy opacity in the left lower lobe. Lumbar spine degenerative changes and minimal scoliosis. IMPRESSION: 1. Gaseous distention of the stomach with no other bowel gas seen. This is concerning for gastric outlet obstruction. 2. Cholelithiasis. 3. Patchy left lower lobe atelectasis or pneumonia. Electronically Signed   By: Claudie Revering M.D.   On: 10/06/2017 19:55   US Renal  Result Date: 10/06/2017 CLINICAL DATA:  67 year old male with acute renal failure. EXAM: RENAL / URINARY TRACT ULTRASOUND COMPLETE COMPARISON:  10/06/2016 ultrasound and 03/06/2017 MR FINDINGS: Right Kidney: Length: 12.1 cm. Diffuse increased echogenicity noted. No solid mass or hydronephrosis noted. Left Kidney: Length: 12.4 cm. Diffuse increased echogenicity noted. No solid mass or hydronephrosis identified. A 1.5 cm cyst is again identified. Bladder: A Foley catheter is present within a collapsed bladder. Incidental note is made of ascites. IMPRESSION: 1. Echogenic kidneys bilaterally compatible with medical renal disease. No evidence of hydronephrosis. 2. Ascites Electronically Signed   By: Margarette Canada M.D.   On: 10/06/2017 16:41   Dg Chest Port 1 View  Result Date: 10/07/2017 CLINICAL DATA:  Respiratory failure EXAM: PORTABLE CHEST 1 VIEW COMPARISON:  Oct 06, 2017 FINDINGS: The ETT, left central line, and NG tube are again identified  and stable within visualize limits. No pneumothorax. Stable cardiomegaly. The hila and mediastinum are unchanged. Mild opacity in the medial right lung base may represent atelectasis, vascular crowding, or subtle infiltrate. Recommend attention on follow-up. No other acute abnormalities. IMPRESSION: 1. Stable support apparatus as above. 2. Mild increased opacity in the medial right lung base could represent vascular crowding, atelectasis, or subtle infiltrate. Recommend attention on follow-up. Electronically Signed   By: Dorise Bullion III M.D   On: 10/07/2017 07:47   Dg Chest Port 1 View  Result Date: 10/06/2017 CLINICAL DATA:  Intubated. EXAM: PORTABLE CHEST 1 VIEW COMPARISON:  Earlier today. FINDINGS: Endotracheal tube tip 3.6 cm above the carina. Left jugular catheter unchanged. Stable enlarged cardiac silhouette and prominent interstitial markings. Mild increase in patchy opacity in the left lower lobe. Minimal right basilar atelectasis with improvement. No acute bony abnormality. IMPRESSION: 1. Endotracheal tube tip 3.5 cm above the carina. This could be retracted 3.5 cm to place it at the level of the clavicles. 2. Mildly increased patchy atelectasis or pneumonia in the left lower lobe. 3. Minimal right basilar atelectasis with improvement. 4. Stable cardiomegaly and chronic interstitial lung disease. Electronically Signed   By: Claudie Revering M.D.   On: 10/06/2017 19:53   Dg Chest Port 1 View  Result Date: 10/06/2017 CLINICAL DATA:  Encounter for central line placement. EXAM: PORTABLE CHEST 1 VIEW COMPARISON:  Radiograph of same day. FINDINGS: Stable cardiomegaly. Hypoinflation of the lungs is noted with mild bibasilar subsegmental atelectasis. Interval placement of left internal jugular dialysis catheter with distal tip in expected position of the SVC. No pneumothorax or significant pleural effusion is noted. Bony thorax is unremarkable. IMPRESSION: Interval placement of left internal jugular  dialysis catheter with distal tip in expected position of SVC. No pneumothorax is noted. Hypoinflation of the  lungs with mild bibasilar subsegmental atelectasis. Electronically Signed   By: Marijo Conception, M.D.   On: 10/06/2017 15:23   Dg Chest Portable 1 View  Result Date: 10/06/2017 CLINICAL DATA:  67 year old male with a history of hypotension EXAM: PORTABLE CHEST 1 VIEW COMPARISON:  05/23/2017, 10/08/2016 FINDINGS: Cardiomediastinal silhouette unchanged, likely accentuated by apical lordotic positioning. Persisting cardiomegaly. No pneumothorax.  No large pleural effusion.  Low lung volumes. IMPRESSION: Redemonstration of cardiomegaly. The accentuated appearance is favored to be secondary to the apical lordotic positioning, however, new pericardial effusion not excluded. If there is concern for pericardial effusion, recommend correlation with ECHO. No evidence of lobar pneumonia or overt edema. Electronically Signed   By: Corrie Mckusick D.O.   On: 10/06/2017 12:15       DISCUSSION: Chronically ill 67 year old male patient resides a nursing home.  Appears to be deconditioned at baseline.  Review of medical records indicate he is wheelchair-bound.  Has a history of ischemic cardiomyopathy, atrial fibrillation CKD stage III.  Not clear if he is infected or simply hypovolemic.  Chest x-ray is clear.  Will ensure adequate volume resuscitation, initiate bicarbonate drip.  A dialysis catheter is been placed in case he needs dialysis.  Currently getting blood, volume, bicarbonate infusion.  We will place him on empiric antibiotics as we await culture data to return nephrology is been consulted.  ASSESSMENT / PLAN:  Acute encephalopathy superimposed on chronic illness and underlying deconditioning.  -Unclear what his baseline cognitive status is.  May have some degree of underlying depression, also wonder about element of dementia   09/27/17 - Per RN improving and intermittenly following  commands  Plan Agree with below PRN sedtion RASS goal 0 to -2 Continue supportive care Correct his metabolic derangements Will need further history from family in regards to baseline cognitive function Holding Wellbutrin   #CARDS Circulatory shock.  Presume hypovolemic versus sepsis.  Does have underlying cardiomyopathy with EF 30 to 35%.  Also current metabolic acidosis likely contributing to decreased cardiac output -Favor hypovolemia and acidosis as likely contributing causes no clear source of infection except perhaps Urinary tract  10/07/17 - on low dose levophed. ECHO similar tto baseline 10/07/17 - 35-40% EF  Plan Levophed for MAP > 65  Bradycardia with frequent pause, history of atrial fibrillation Plan        Holding apixaban given anemia and unclear etiology of shock Continue telemetry monitoring Holding Coreg   #RENAL  Acute on chronic renal failure.  With stage III/IV chronic kidney disease.  Baseline serum creatinine around 1.6.  Presenting to the ER with creatinine 14.42;   Acute anion gap metabolic acidosis with ATN.  Suspect this is the setting of use acute kidney injury.  Lactic acid normal. Plan Per RENAL CRRT Holding allopurinol, Proscar, and Demadex  #HEME Recent Labs  Lab 10/06/17 2007 10/07/17 0345 10/07/17 1042  HGB 8.2* 6.9* 8.1*  HCT 24.6* 20.6* 23.7*  WBC 8.1 5.0 4.9  PLT 47*  52* 61* 48*    Anemia has history of colon cancer.  Fecal occult blood positive today but was also positive a year ago.  Last hemoglobin actually 1 year ago per our records no obvious hemorrhage   10/07/17 - no visible bleeding. S/p PRBC  Plan N.p.o. for now - PRBC for hgb </= 6.9gm%    - exceptions are   -  if ACS susepcted/confirmed then transfuse for hgb </= 8.0gm%,  or    -  active bleeding with hemodynamic  instability, then transfuse regardless of hemoglobin value   At at all times try to transfuse 1 unit prbc as possible with exception of active  hemorrhage    Severe thrombocytopenia as well as borderline coagulopathy.  Etiology unclear.  Question hepatic congestion versus possible sepsis related.  DIC panel equivaocal. TTP - no indication given minimal jaundice, improing Cns features and no schistocytes   Plan Serial CBCs Transfuse for platelets less than 25 Trend INR Holding anticoagulation     FAMILY  - Updates:  None at bedside 5/111/19  - Inter-disciplinary family meet or Palliative Care meeting due by: 21      The patient is critically ill with multiple organ systems failure and requires high complexity decision making for assessment and support, frequent evaluation and titration of therapies, application of advanced monitoring technologies and extensive interpretation of multiple databases.   Critical Care Time devoted to patient care services described in this note is  30  Minutes. This time reflects time of care of this signee Dr Brand Males. This critical care time does not reflect procedure time, or teaching time or supervisory time of PA/NP/Med student/Med Resident etc but could involve care discussion time    Dr. Brand Males, M.D., [facility].C.P Pulmonary and Critical Care Medicine Staff Physician [place] [facility] Pager: [phone], If no answer or between  15:00h - 7:00h: call [phone]  10/07/2017 12:10 PM

## 2017-10-07 NOTE — Progress Notes (Signed)
CRITICAL VALUE ALERT  Critical Value:  hgb 6.9  Date & Time Notied:  5/11 0430  Provider Notified: Hayden Pedro, NP  Orders Received/Actions taken: Transfuse 1 unit PRBC  CRITICAL VALUE ALERT  Critical Value:  Troponin 0.55  Date & Time Notied:  5/11 0445  Provider Notified: Hayden Pedro, NP  Orders Received/Actions taken: No new orders

## 2017-10-07 NOTE — Progress Notes (Signed)
Subjective: Intubated but awake. No blood in colostomy.  Objective: Vital signs in last 24 hours: Temp:  [87.1 F (30.6 C)-98.1 F (36.7 C)] 98.1 F (36.7 C) (05/11 0900) Pulse Rate:  [44-89] 65 (05/11 0900) Resp:  [14-25] 17 (05/11 0900) BP: (55-137)/(26-89) 108/53 (05/11 0800) SpO2:  [93 %-100 %] 99 % (05/11 0900) Arterial Line BP: (35)/(19) 35/19 (05/10 2045) FiO2 (%):  [30 %-100 %] 30 % (05/11 0826) Weight:  [86 kg (189 lb 9.5 oz)-86.7 kg (191 lb 2.2 oz)] 86 kg (189 lb 9.5 oz) (05/11 0500) Weight change:  Last BM Date: 10/07/17  PE: GEN:  Intubated but awake. ABD:  Soft, green liquid stool in colostomy bag.  Lab Results: CBC    Component Value Date/Time   WBC 5.0 10/07/2017 0345   RBC 2.61 (L) 10/07/2017 0345   HGB 6.9 (LL) 10/07/2017 0345   HGB 10.9 (L) 02/20/2017 1459   HGB 10.8 (L) 12/22/2016 1446   HCT 20.6 (L) 10/07/2017 0345   HCT 34.6 (L) 02/20/2017 1459   HCT 34.7 (L) 12/22/2016 1446   PLT 61 (L) 10/07/2017 0345   PLT 102 (L) 02/20/2017 1459   MCV 78.9 10/07/2017 0345   MCV 84 02/20/2017 1459   MCV 85.0 12/22/2016 1446   MCH 26.4 10/07/2017 0345   MCHC 33.5 10/07/2017 0345   RDW 21.0 (H) 10/07/2017 0345   RDW 18.1 (H) 02/20/2017 1459   RDW 18.7 (H) 12/22/2016 1446   LYMPHSABS 0.3 (L) 10/06/2017 1123   LYMPHSABS 0.5 (L) 12/22/2016 1446   MONOABS 0.2 10/06/2017 1123   MONOABS 0.6 12/22/2016 1446   EOSABS 0.0 10/06/2017 1123   EOSABS 0.1 12/22/2016 1446   EOSABS WILL FOLLOW 09/26/2016 1655   BASOSABS 0.0 10/06/2017 1123   BASOSABS 0.0 12/22/2016 1446   CMP     Component Value Date/Time   NA 139 10/07/2017 0345   NA 144 02/22/2017 1524   NA 143 12/22/2016 1446   K 3.9 10/07/2017 0345   K 4.3 12/22/2016 1446   CL 104 10/07/2017 0345   CO2 19 (L) 10/07/2017 0345   CO2 26 12/22/2016 1446   GLUCOSE 81 10/07/2017 0345   GLUCOSE 104 12/22/2016 1446   BUN 109 (H) 10/07/2017 0345   BUN 23 02/22/2017 1524   BUN 16.2 12/22/2016 1446   CREATININE  10.76 (H) 10/07/2017 0345   CREATININE 1.2 12/22/2016 1446   CALCIUM 7.3 (L) 10/07/2017 0345   CALCIUM 9.6 12/22/2016 1446   PROT 6.5 10/06/2017 2007   PROT 6.6 10/06/2017 2007   PROT 7.6 12/22/2016 1446   ALBUMIN 2.2 (L) 10/07/2017 0345   ALBUMIN 3.2 (L) 12/22/2016 1446   AST 9 (L) 10/06/2017 2007   AST 11 (L) 10/06/2017 2007   AST 13 12/22/2016 1446   ALT 12 (L) 10/06/2017 2007   ALT 11 (L) 10/06/2017 2007   ALT 9 12/22/2016 1446   ALKPHOS 94 10/06/2017 2007   ALKPHOS 94 10/06/2017 2007   ALKPHOS 118 12/22/2016 1446   BILITOT 1.6 (H) 10/06/2017 2007   BILITOT 1.8 (H) 10/06/2017 2007   BILITOT 0.63 12/22/2016 1446   GFRNONAA 4 (L) 10/07/2017 0345   GFRAA 5 (L) 10/07/2017 0345   Assessment:  1.  Acute hypoxic respiratory failure, currently on ventilatory. 2.  Shock with mental status changes (encephalopathy). 3.  Anemia, progressive, without overt GI bleeding.  Plan:  1.  Supportive care from GI perspective is recommended. 2.  No GI intervention in absence of life-threatening bleeding, which patient  does not have. 3.  [facility] will sign-off; please call with questions; thank you for the consultation.   Landry Dyke 10/07/2017, 10:52 AM   Cell [phone] If no answer or after 5 PM call [phone]

## 2017-10-07 NOTE — Progress Notes (Signed)
[facility] ROUNDING NOTE   Subjective:   Interval History:  Patient now intubated and requiring CRRT    Brief history  :  67 year old wheel chair bound patient from [facility] that was brought to the ER for acute encephalopathy  EF 35%    Colon cancer and history of colostostomy  He was found to be in acute renal failure with presumed septic shock and was started on CRRT 5/10 with a worsening acidosis and worsening respiratory status.        Objective:  Vital signs in last 24 hours:  Temp:  [87.1 F (30.6 C)-98.2 F (36.8 C)] 97.9 F (36.6 C) (05/11 1100) Pulse Rate:  [44-89] 67 (05/11 1100) Resp:  [7-25] 7 (05/11 1100) BP: (55-137)/(26-89) 99/51 (05/11 1100) SpO2:  [93 %-100 %] 99 % (05/11 1100) Arterial Line BP: (35)/(19) 35/19 (05/10 2045) FiO2 (%):  [30 %-100 %] 30 % (05/11 1114) Weight:  [189 lb 9.5 oz (86 kg)-191 lb 2.2 oz (86.7 kg)] 189 lb 9.5 oz (86 kg) (05/11 0500)  Weight change:  Filed Weights   10/06/17 2000 10/07/17 0500  Weight: 191 lb 2.2 oz (86.7 kg) 189 lb 9.5 oz (86 kg)    Intake/Output: I/O last 3 completed shifts: In: 7481.1 [I.V.:2751.1; Blood:580; Other:450; IV Piggyback:3700] Out: 3888 [Urine:19; Other:1560; Stool:300]   Intake/Output this shift:  Total I/O In: 122.4 [I.V.:62.4; Blood:30; Other:30] Out: 125 [Other:125]  Intubated and sedated  CVS- RRR no murmurs  RS- CTA  JVP flat ABD- BS present soft non-distended EXT- no edema   Basic Metabolic Panel: Recent Labs  Lab 10/06/17 1123 10/06/17 2007 10/07/17 0307 10/07/17 0345  NA 137 136  137  --  139  K 5.7* 5.7*  5.7*  --  3.9  CL 109 111  111  --  104  CO2 10* <7*  9*  --  19*  GLUCOSE 83 99  103*  --  81  BUN 143* 139*  135*  --  109*  CREATININE 14.42* 13.82*  13.60*  --  10.76*  CALCIUM 8.2* 7.7*  7.8*  --  7.3*  MG  --  1.7 1.5*  --   PHOS  --  11.2* 8.0* 7.9*    Liver Function Tests: Recent Labs  Lab 10/06/17 1123 10/06/17 2007  10/07/17 0345  AST 9* 11*  9*  --   ALT 12* 11*  12*  --   ALKPHOS 100 94  94  --   BILITOT 1.5* 1.8*  1.6*  --   PROT 6.9 6.6  6.5  --   ALBUMIN 2.6* 2.5*  2.3* 2.2*   No results for input(s): LIPASE, AMYLASE in the last 168 hours. No results for input(s): AMMONIA in the last 168 hours.  CBC: Recent Labs  Lab 10/06/17 1123 10/06/17 2007 10/07/17 0345 10/07/17 1042  WBC 4.2 8.1 5.0 4.9  NEUTROABS 3.7  --   --   --   HGB 7.7* 8.2* 6.9* 8.1*  HCT 23.4* 24.6* 20.6* 23.7*  MCV 82.4 81.7 78.9 79.3  PLT 32* 47*  52* 61* 48*    Cardiac Enzymes: Recent Labs  Lab 10/06/17 2007 10/07/17 0307  TROPONINI 0.33* 0.55*    BNP: Invalid input(s): POCBNP  CBG: Recent Labs  Lab 10/06/17 2004 10/06/17 2324 10/07/17 0340 10/07/17 0414 10/07/17 0826  GLUCAP 91 97 68 103* 63    Microbiology: Results for orders placed or performed during the hospital encounter of 10/06/17  Urine culture  Status: Abnormal (Preliminary result)   Collection Time: 10/06/17 11:39 AM  Result Value Ref Range Status   Specimen Description URINE, CATHETERIZED  Final   Special Requests NONE  Final   Culture (A)  Final    >=100,000 COLONIES/mL ENTEROBACTER SPECIES SUSCEPTIBILITIES TO FOLLOW Performed at [facility], [address] [place], [place] [address]    Report Status PENDING  Incomplete  Culture, blood (Routine x 2)     Status: None (Preliminary result)   Collection Time: 10/06/17 11:40 AM  Result Value Ref Range Status   Specimen Description BLOOD RIGHT UPPER ARM  Final   Special Requests   Final    BOTTLES DRAWN AEROBIC AND ANAEROBIC Blood Culture adequate volume   Culture   Final    NO GROWTH < 24 HOURS Performed at [facility], [place] [address], [place], [place] [address]    Report Status PENDING  Incomplete  Culture, blood (Routine x 2)     Status: None (Preliminary result)   Collection Time: 10/06/17 11:50 AM  Result Value Ref Range Status   Specimen  Description BLOOD LEFT ANTECUBITAL  Final   Special Requests   Final    BOTTLES DRAWN AEROBIC AND ANAEROBIC Blood Culture results may not be optimal due to an excessive volume of blood received in culture bottles   Culture   Final    NO GROWTH < 24 HOURS Performed at [facility], [place] [address] [place],  [address]    Report Status PENDING  Incomplete    Coagulation Studies: Recent Labs    10/06/17 1123 10/06/17 2007  LABPROT 25.0* 24.0*  INR 2.28 2.17    Urinalysis: Recent Labs    10/06/17 1101  COLORURINE RED*  LABSPEC TEST NOT REPORTED DUE TO COLOR INTERFERENCE OF URINE PIGMENT  PHURINE TEST NOT REPORTED DUE TO COLOR INTERFERENCE OF URINE PIGMENT  GLUCOSEU TEST NOT REPORTED DUE TO COLOR INTERFERENCE OF URINE PIGMENT*  HGBUR TEST NOT REPORTED DUE TO COLOR INTERFERENCE OF URINE PIGMENT*  BILIRUBINUR TEST NOT REPORTED DUE TO COLOR INTERFERENCE OF URINE PIGMENT*  KETONESUR TEST NOT REPORTED DUE TO COLOR INTERFERENCE OF URINE PIGMENT*  PROTEINUR TEST NOT REPORTED DUE TO COLOR INTERFERENCE OF URINE PIGMENT*  NITRITE TEST NOT REPORTED DUE TO COLOR INTERFERENCE OF URINE PIGMENT*  LEUKOCYTESUR TEST NOT REPORTED DUE TO COLOR INTERFERENCE OF URINE PIGMENT*      Imaging: Dg Abd 1 View  Result Date: 10/06/2017 CLINICAL DATA:  Orogastric tube placement. EXAM: ABDOMEN - 1 VIEW COMPARISON:  06/01/2016. FINDINGS: Orogastric tube tip in the proximal stomach and side hole in the proximal to mid stomach. Gaseous distention of the stomach. Again demonstrated are 3 large gallstones in the gallbladder measuring up to 2.6 cm in maximum diameter each. No bowel gas visualized except for the gas in the stomach. Patchy opacity in the left lower lobe. Lumbar spine degenerative changes and minimal scoliosis. IMPRESSION: 1. Gaseous distention of the stomach with no other bowel gas seen. This is concerning for gastric outlet obstruction. 2. Cholelithiasis. 3. Patchy left lower lobe  atelectasis or pneumonia. Electronically Signed   By: Claudie Revering M.D.   On: 10/06/2017 19:55   US Renal  Result Date: 10/06/2017 CLINICAL DATA:  67 year old male with acute renal failure. EXAM: RENAL / URINARY TRACT ULTRASOUND COMPLETE COMPARISON:  10/06/2016 ultrasound and 03/06/2017 MR FINDINGS: Right Kidney: Length: 12.1 cm. Diffuse increased echogenicity noted. No solid mass or hydronephrosis noted. Left Kidney: Length: 12.4 cm. Diffuse  increased echogenicity noted. No solid mass or hydronephrosis identified. A 1.5 cm cyst is again identified. Bladder: A Foley catheter is present within a collapsed bladder. Incidental note is made of ascites. IMPRESSION: 1. Echogenic kidneys bilaterally compatible with medical renal disease. No evidence of hydronephrosis. 2. Ascites Electronically Signed   By: Margarette Canada M.D.   On: 10/06/2017 16:41   Dg Chest Port 1 View  Result Date: 10/07/2017 CLINICAL DATA:  Respiratory failure EXAM: PORTABLE CHEST 1 VIEW COMPARISON:  Oct 06, 2017 FINDINGS: The ETT, left central line, and NG tube are again identified and stable within visualize limits. No pneumothorax. Stable cardiomegaly. The hila and mediastinum are unchanged. Mild opacity in the medial right lung base may represent atelectasis, vascular crowding, or subtle infiltrate. Recommend attention on follow-up. No other acute abnormalities. IMPRESSION: 1. Stable support apparatus as above. 2. Mild increased opacity in the medial right lung base could represent vascular crowding, atelectasis, or subtle infiltrate. Recommend attention on follow-up. Electronically Signed   By: Dorise Bullion III M.D   On: 10/07/2017 07:47   Dg Chest Port 1 View  Result Date: 10/06/2017 CLINICAL DATA:  Intubated. EXAM: PORTABLE CHEST 1 VIEW COMPARISON:  Earlier today. FINDINGS: Endotracheal tube tip 3.6 cm above the carina. Left jugular catheter unchanged. Stable enlarged cardiac silhouette and prominent interstitial markings. Mild  increase in patchy opacity in the left lower lobe. Minimal right basilar atelectasis with improvement. No acute bony abnormality. IMPRESSION: 1. Endotracheal tube tip 3.5 cm above the carina. This could be retracted 3.5 cm to place it at the level of the clavicles. 2. Mildly increased patchy atelectasis or pneumonia in the left lower lobe. 3. Minimal right basilar atelectasis with improvement. 4. Stable cardiomegaly and chronic interstitial lung disease. Electronically Signed   By: Claudie Revering M.D.   On: 10/06/2017 19:53   Dg Chest Port 1 View  Result Date: 10/06/2017 CLINICAL DATA:  Encounter for central line placement. EXAM: PORTABLE CHEST 1 VIEW COMPARISON:  Radiograph of same day. FINDINGS: Stable cardiomegaly. Hypoinflation of the lungs is noted with mild bibasilar subsegmental atelectasis. Interval placement of left internal jugular dialysis catheter with distal tip in expected position of the SVC. No pneumothorax or significant pleural effusion is noted. Bony thorax is unremarkable. IMPRESSION: Interval placement of left internal jugular dialysis catheter with distal tip in expected position of SVC. No pneumothorax is noted. Hypoinflation of the lungs with mild bibasilar subsegmental atelectasis. Electronically Signed   By: Marijo Conception, M.D.   On: 10/06/2017 15:23   Dg Chest Portable 1 View  Result Date: 10/06/2017 CLINICAL DATA:  67 year old male with a history of hypotension EXAM: PORTABLE CHEST 1 VIEW COMPARISON:  05/23/2017, 10/08/2016 FINDINGS: Cardiomediastinal silhouette unchanged, likely accentuated by apical lordotic positioning. Persisting cardiomegaly. No pneumothorax.  No large pleural effusion.  Low lung volumes. IMPRESSION: Redemonstration of cardiomegaly. The accentuated appearance is favored to be secondary to the apical lordotic positioning, however, new pericardial effusion not excluded. If there is concern for pericardial effusion, recommend correlation with ECHO. No evidence  of lobar pneumonia or overt edema. Electronically Signed   By: Corrie Mckusick D.O.   On: 10/06/2017 12:15     Medications:   . sodium chloride 10 mL/hr at 10/07/17 0700  . norepinephrine (LEVOPHED) Adult infusion 5.973 mcg/min (10/07/17 0700)  . piperacillin-tazobactam Stopped (10/07/17 0445)  . dialysate (PRISMASATE) 1,500 mL/hr at 10/07/17 0843  . sodium bicarbonate (isotonic) 1000 mL infusion 500 mL/hr at 10/07/17 0904  . sodium bicarbonate (  isotonic) 1000 mL infusion 200 mL/hr at 10/07/17 1059  . vancomycin    . vasopressin (PITRESSIN) infusion - *FOR SHOCK* Stopped (10/06/17 2331)   . chlorhexidine gluconate (MEDLINE KIT)  15 mL Mouth Rinse BID  . Chlorhexidine Gluconate Cloth  6 each Topical Daily  . mouth rinse  15 mL Mouth Rinse QID  . pantoprazole (PROTONIX) IV  40 mg Intravenous Q12H  . sodium chloride flush  10-40 mL Intracatheter Q12H   sodium chloride, fentaNYL (SUBLIMAZE) injection, heparin, midazolam, sodium chloride flush  Assessment/ Plan:    Acute renal insufficiency with possibility of septic shock  His urinalysis revealed >50 rbc and 21 - 50 wbc with many bacteria  Urine cultures showed enterobacter. I imagine that this urosepsis is the etiology of the acute renal failure. The renal ultrasound showed no evidence of hydronephrosis   He has been started on CRRT  Shock he continues on pressors   Metabolic acidosis correcting with CRRT  Anemia  Hb 8.1 will continue to follow  Sepsis  ID enterobacter  Treated with empiric vancomycin and Zosyn   LOS: 1 Patrick [name] W '@TODAY' '@11' :53 AM

## 2017-10-07 NOTE — Progress Notes (Signed)
  Echocardiogram 2D Echocardiogram has been performed.  Patrick [name] 10/07/2017, 10:05 AM

## 2017-10-08 ENCOUNTER — Inpatient Hospital Stay (HOSPITAL_COMMUNITY): Payer: Medicare Other

## 2017-10-08 LAB — GLUCOSE, CAPILLARY
GLUCOSE-CAPILLARY: 69 mg/dL (ref 65–99)
GLUCOSE-CAPILLARY: 73 mg/dL (ref 65–99)
GLUCOSE-CAPILLARY: 66 mg/dL (ref 65–99)
GLUCOSE-CAPILLARY: 126 mg/dL — AB (ref 65–99)
GLUCOSE-CAPILLARY: 97 mg/dL (ref 65–99)
Glucose-Capillary: 100 mg/dL — ABNORMAL HIGH (ref 65–99)
Glucose-Capillary: 68 mg/dL (ref 65–99)
Glucose-Capillary: 73 mg/dL (ref 65–99)

## 2017-10-08 LAB — CBC WITH DIFFERENTIAL/PLATELET
BASOS PCT: 1 %
Basophils Absolute: 0 10*3/uL (ref 0.0–0.1)
Eosinophils Absolute: 0.1 10*3/uL (ref 0.0–0.7)
Eosinophils Relative: 2 %
HEMATOCRIT: 24.4 % — AB (ref 39.0–52.0)
Hemoglobin: 8.2 g/dL — ABNORMAL LOW (ref 13.0–17.0)
Lymphocytes Relative: 11 %
Lymphs Abs: 0.5 10*3/uL — ABNORMAL LOW (ref 0.7–4.0)
MCH: 27.1 pg (ref 26.0–34.0)
MCHC: 33.6 g/dL (ref 30.0–36.0)
MCV: 80.5 fL (ref 78.0–100.0)
MONO ABS: 0.5 10*3/uL (ref 0.1–1.0)
Monocytes Relative: 12 %
NEUTROS ABS: 3.3 10*3/uL (ref 1.7–7.7)
NEUTROS PCT: 74 %
Platelets: 39 10*3/uL — ABNORMAL LOW (ref 150–400)
RBC: 3.03 MIL/uL — ABNORMAL LOW (ref 4.22–5.81)
RDW: 20.4 % — AB (ref 11.5–15.5)
WBC: 4.4 10*3/uL (ref 4.0–10.5)

## 2017-10-08 LAB — BASIC METABOLIC PANEL
Anion gap: 15 (ref 5–15)
BUN: 54 mg/dL — AB (ref 6–20)
CHLORIDE: 82 mmol/L — AB (ref 101–111)
CO2: 39 mmol/L — AB (ref 22–32)
CREATININE: 5.98 mg/dL — AB (ref 0.61–1.24)
Calcium: 6.4 mg/dL — CL (ref 8.9–10.3)
GFR calc Af Amer: 10 mL/min — ABNORMAL LOW (ref >60)
GFR calc non Af Amer: 9 mL/min — ABNORMAL LOW (ref >60)
Glucose, Bld: 81 mg/dL (ref 65–99)
Potassium: 3.6 mmol/L (ref 3.5–5.1)
Sodium: 136 mmol/L (ref 135–145)

## 2017-10-08 LAB — RENAL FUNCTION PANEL
ALBUMIN: 2 g/dL — AB (ref 3.5–5.0)
ANION GAP: 15 (ref 5–15)
ANION GAP: 9 (ref 5–15)
Albumin: 2 g/dL — ABNORMAL LOW (ref 3.5–5.0)
BUN: 47 mg/dL — AB (ref 6–20)
BUN: 33 mg/dL — ABNORMAL HIGH (ref 6–20)
CO2: 36 mmol/L — ABNORMAL HIGH (ref 22–32)
CO2: 32 mmol/L (ref 22–32)
Calcium: 6.6 mg/dL — ABNORMAL LOW (ref 8.9–10.3)
Calcium: 7.1 mg/dL — ABNORMAL LOW (ref 8.9–10.3)
Chloride: 87 mmol/L — ABNORMAL LOW (ref 101–111)
Chloride: 95 mmol/L — ABNORMAL LOW (ref 101–111)
Creatinine, Ser: 5.25 mg/dL — ABNORMAL HIGH (ref 0.61–1.24)
Creatinine, Ser: 3.96 mg/dL — ABNORMAL HIGH (ref 0.61–1.24)
GFR calc Af Amer: 12 mL/min — ABNORMAL LOW (ref >60)
GFR calc Af Amer: 16 mL/min — ABNORMAL LOW (ref >60)
GFR calc non Af Amer: 14 mL/min — ABNORMAL LOW (ref >60)
GFR, EST NON AFRICAN AMERICAN: 10 mL/min — AB (ref >60)
GLUCOSE: 99 mg/dL (ref 65–99)
Glucose, Bld: 75 mg/dL (ref 65–99)
PHOSPHORUS: 2.8 mg/dL (ref 2.5–4.6)
POTASSIUM: 3.3 mmol/L — AB (ref 3.5–5.1)
POTASSIUM: 3.8 mmol/L (ref 3.5–5.1)
Phosphorus: 2.3 mg/dL — ABNORMAL LOW (ref 2.5–4.6)
Sodium: 138 mmol/L (ref 135–145)
Sodium: 136 mmol/L (ref 135–145)

## 2017-10-08 LAB — URINE CULTURE: Culture: >=100000 — AB

## 2017-10-08 LAB — PHOSPHORUS: PHOSPHORUS: 2.8 mg/dL (ref 2.5–4.6)

## 2017-10-08 LAB — MAGNESIUM: Magnesium: 1.9 mg/dL (ref 1.7–2.4)

## 2017-10-08 MED ORDER — VITAL AF 1.2 CAL PO LIQD
1000.0000 mL | ORAL | Status: DC
  Administered 2017-10-08: 1000 mL

## 2017-10-08 MED ORDER — DEXTROSE 50 % IV SOLN
1.0000 | Freq: Once | INTRAVENOUS | Status: AC
  Administered 2017-10-08: 50 mL via INTRAVENOUS
  Filled 2017-10-08: qty 50

## 2017-10-08 MED ORDER — PRO-STAT SUGAR FREE PO LIQD
30.0000 mL | Freq: Two times a day (BID) | ORAL | Status: DC
  Administered 2017-10-08 – 2017-10-09 (×3): 30 mL
  Filled 2017-10-08 (×4): qty 30

## 2017-10-08 MED ORDER — POTASSIUM CHLORIDE 20 MEQ/15ML (10%) PO SOLN
40.0000 meq | Freq: Once | ORAL | Status: AC
  Administered 2017-10-08: 40 meq via ORAL
  Filled 2017-10-08: qty 30

## 2017-10-08 MED ORDER — PRISMASOL BGK 4/2.5 32-4-2.5 MEQ/L IV SOLN
INTRAVENOUS | Status: DC
  Administered 2017-10-08 – 2017-10-09 (×2): via INTRAVENOUS_CENTRAL
  Filled 2017-10-08 (×4): qty 5000

## 2017-10-08 MED ORDER — PRISMASOL BGK 4/2.5 32-4-2.5 MEQ/L IV SOLN
INTRAVENOUS | Status: DC
  Administered 2017-10-08 – 2017-10-09 (×5): via INTRAVENOUS_CENTRAL
  Filled 2017-10-08 (×6): qty 5000

## 2017-10-08 MED ORDER — DEXTROSE 50 % IV SOLN
INTRAVENOUS | Status: AC
  Administered 2017-10-08: 50 mL via INTRAVENOUS
  Filled 2017-10-08: qty 50

## 2017-10-08 MED ORDER — DEXTROSE 5 % IV SOLN
INTRAVENOUS | Status: DC
Start: 2017-10-08 — End: 2017-10-10
  Administered 2017-10-08 – 2017-10-09 (×3): via INTRAVENOUS

## 2017-10-08 MED ORDER — SODIUM CHLORIDE 0.9 % IV SOLN
1.0000 g | Freq: Two times a day (BID) | INTRAVENOUS | Status: DC
  Administered 2017-10-08 – 2017-10-09 (×4): 1 g via INTRAVENOUS
  Filled 2017-10-08 (×5): qty 1

## 2017-10-08 NOTE — Plan of Care (Signed)
Pt is getting better. Alert, follows commands and nods appropriately. Restraints discontinued. Mittens are still in use. Pt reoriented often.  Stttable CRRT. Keeping even. No urinal output . For today shift 5 cc. Nephrology is aware.  Sugars low. D5W at 75 and tube feeding trickle in fusing per MD order.  Oral care and turns provided frequently. Skin is fragile but sacrum is intact. Sister called and updated. Bed in low position, alarms are one, call bell in reach, continue to monitor.

## 2017-10-08 NOTE — Progress Notes (Signed)
Pharmacy Antibiotic Note  Patrick [name] is a 67 y.o. male admitted on 10/06/2017 with sepsis. Pt growing enterobacter resistant to zosyn in urine. Changing antibiotics from vancomycin and zosyn to meropenem based on sensitivities. Pt is afebrile and WBC is WNL.   Plan: Meropenem 1gm IV Q12H F/u renal fxn, C&S, clinical status and LOT   Height: 5\' 6"  (167.6 cm) Weight: 191 lb 9.3 oz (86.9 kg) IBW/kg (Calculated) : 63.8  Temp (24hrs), Avg:97 F (36.1 C), Min:96.4 F (35.8 C), Max:97.9 F (36.6 C)  Recent Labs  Lab 10/06/17 1205 10/06/17 2007 10/07/17 0345 10/07/17 1042 10/07/17 1543 10/07/17 2042 10/07/17 2358 10/08/17 0409  WBC  --  8.1 5.0 4.9 4.8 4.2  --  4.4  CREATININE  --  13.82*  13.60* 10.76*  --  7.25*  --  5.98* 5.25*  LATICACIDVEN 0.30*  --   --   --   --   --   --   --     Estimated Creatinine Clearance: 13.7 mL/min (A) (by C-G formula based on SCr of 5.25 mg/dL (H)).    Allergies  Allergen Reactions  . Nsaids     Kidney disease    Antimicrobials this admission: Meropenem 5/12>> Ceftriaxone 5/10 x 1 Zosyn 5/10>>5/12 Vanc 5/10>>5/12  5/10 blood x 2 - NGTD 5/10 urine - enterobacter  Thank you for allowing pharmacy to be a part of this patient's care.  Garrett Mitchum, Rande Lawman 10/08/2017 10:55 AM

## 2017-10-08 NOTE — Progress Notes (Signed)
[facility] ROUNDING NOTE   Subjective:   Interval History:  Patient intubated and now on CRRT  Appears to look better this morning he is alert   Brief History  67 year old wheel chair bound patient from [facility] that was brought to the ER for acute encephalopathy EF 35% Colon cancer and history of colostostomy He was found to be in acute renal failure with presumed septic shock and was started on CRRT 5/10 with a worsening acidosis and worsening respiratory status.       Objective:  Vital signs in last 24 hours:  Temp:  [96.4 F (35.8 C)-97.5 F (36.4 C)] 96.8 F (36 C) (05/12 1200) Pulse Rate:  [61-130] 68 (05/12 1200) Resp:  [16-28] 20 (05/12 1200) BP: (81-129)/(51-81) 107/60 (05/12 1200) SpO2:  [94 %-100 %] 100 % (05/12 1200) FiO2 (%):  [30 %] 30 % (05/12 0800) Weight:  [191 lb 9.3 oz (86.9 kg)] 191 lb 9.3 oz (86.9 kg) (05/12 0415)  Weight change: 7.1 oz (0.2 kg) Filed Weights   10/06/17 2000 10/07/17 0500 10/08/17 0415  Weight: 191 lb 2.2 oz (86.7 kg) 189 lb 9.5 oz (86 kg) 191 lb 9.3 oz (86.9 kg)    Intake/Output: I/O last 3 completed shifts: In: 2919 [I.V.:1439; Blood:610; Other:480; NG/GT:140; IV Piggyback:1650] Out: 2894 [Urine:39; TYOMA:0045; Stool:525]   Intake/Output this shift:  Total I/O In: 404.5 [I.V.:304.5; IV Piggyback:100] Out: 402 [Urine:7; Other:195; Stool:200]  Alert  CVS- RRR  No rubs RS- CTA  JVP not elevated  ABD- BS  Somewhat distant  EXT- no edema   Basic Metabolic Panel: Recent Labs  Lab 10/06/17 2007 10/07/17 0307 10/07/17 0345 10/07/17 1543 10/07/17 2358 10/08/17 0409  NA 136  137  --  139 138 136 138  K 5.7*  5.7*  --  3.9 3.2* 3.6 3.3*  CL 111  111  --  104 90* 82* 87*  CO2 <7*  9*  --  19* 31 39* 36*  GLUCOSE 99  103*  --  81 83 81 75  BUN 139*  135*  --  109* 71* 54* 47*  CREATININE 13.82*  13.60*  --  10.76* 7.25* 5.98* 5.25*  CALCIUM 7.7*  7.8*  --  7.3* 6.5* 6.4* 6.6*  MG 1.7 1.5*  --    --   --  1.9  PHOS 11.2* 8.0* 7.9* 4.6  --  2.8  2.8    Liver Function Tests: Recent Labs  Lab 10/06/17 1123 10/06/17 2007 10/07/17 0345 10/07/17 1543 10/08/17 0409  AST 9* 11*  9*  --   --   --   ALT 12* 11*  12*  --   --   --   ALKPHOS 100 94  94  --   --   --   BILITOT 1.5* 1.8*  1.6*  --   --   --   PROT 6.9 6.6  6.5  --   --   --   ALBUMIN 2.6* 2.5*  2.3* 2.2* 2.1* 2.0*   No results for input(s): LIPASE, AMYLASE in the last 168 hours. No results for input(s): AMMONIA in the last 168 hours.  CBC: Recent Labs  Lab 10/06/17 1123  10/07/17 0345 10/07/17 1042 10/07/17 1543 10/07/17 2042 10/08/17 0409  WBC 4.2   < > 5.0 4.9 4.8 4.2 4.4  NEUTROABS 3.7  --   --   --   --   --  3.3  HGB 7.7*   < > 6.9*  8.1* 7.9* 7.8* 8.2*  HCT 23.4*   < > 20.6* 23.7* 23.6* 23.5* 24.4*  MCV 82.4   < > 78.9 79.3 78.9 79.4 80.5  PLT 32*   < > 61* 48* 45* 41* 39*   < > = values in this interval not displayed.    Cardiac Enzymes: Recent Labs  Lab 10/06/17 2007 10/07/17 0307  TROPONINI 0.33* 0.55*    BNP: Invalid input(s): POCBNP  CBG: Recent Labs  Lab 10/07/17 2034 10/08/17 0006 10/08/17 0410 10/08/17 0825 10/08/17 1235  GLUCAP 176* 73 60 73 26    Microbiology: Results for orders placed or performed during the hospital encounter of 10/06/17  Urine culture     Status: Abnormal   Collection Time: 10/06/17 11:39 AM  Result Value Ref Range Status   Specimen Description URINE, CATHETERIZED  Final   Special Requests   Final    NONE Performed at [facility], [address] [place], [place] [address]    Culture >=100,000 COLONIES/mL ENTEROBACTER SPECIES (A)  Final   Report Status 10/08/2017 FINAL  Final   Organism ID, Bacteria ENTEROBACTER SPECIES (A)  Final      Susceptibility   Enterobacter species - MIC*    CEFAZOLIN >=64 RESISTANT Resistant     CEFTRIAXONE >=64 RESISTANT Resistant     CIPROFLOXACIN <=0.25 SENSITIVE Sensitive     GENTAMICIN <=1  SENSITIVE Sensitive     IMIPENEM 0.5 SENSITIVE Sensitive     NITROFURANTOIN 32 SENSITIVE Sensitive     TRIMETH/SULFA <=20 SENSITIVE Sensitive     PIP/TAZO >=128 RESISTANT Resistant     * >=100,000 COLONIES/mL ENTEROBACTER SPECIES  Culture, blood (Routine x 2)     Status: None (Preliminary result)   Collection Time: 10/06/17 11:40 AM  Result Value Ref Range Status   Specimen Description BLOOD RIGHT UPPER ARM  Final   Special Requests   Final    BOTTLES DRAWN AEROBIC AND ANAEROBIC Blood Culture adequate volume   Culture   Final    NO GROWTH 2 DAYS Performed at [facility], [address] [place], [place] [address]    Report Status PENDING  Incomplete  Culture, blood (Routine x 2)     Status: None (Preliminary result)   Collection Time: 10/06/17 11:50 AM  Result Value Ref Range Status   Specimen Description BLOOD LEFT ANTECUBITAL  Final   Special Requests   Final    BOTTLES DRAWN AEROBIC AND ANAEROBIC Blood Culture results may not be optimal due to an excessive volume of blood received in culture bottles   Culture   Final    NO GROWTH 2 DAYS Performed at [facility], [place] [address] [place],  [address]    Report Status PENDING  Incomplete    Coagulation Studies: Recent Labs    10/06/17 1123 10/06/17 2007  LABPROT 25.0* 24.0*  INR 2.28 2.17    Urinalysis: Recent Labs    10/06/17 1101  COLORURINE RED*  LABSPEC TEST NOT REPORTED DUE TO COLOR INTERFERENCE OF URINE PIGMENT  PHURINE TEST NOT REPORTED DUE TO COLOR INTERFERENCE OF URINE PIGMENT  GLUCOSEU TEST NOT REPORTED DUE TO COLOR INTERFERENCE OF URINE PIGMENT*  HGBUR TEST NOT REPORTED DUE TO COLOR INTERFERENCE OF URINE PIGMENT*  BILIRUBINUR TEST NOT REPORTED DUE TO COLOR INTERFERENCE OF URINE PIGMENT*  KETONESUR TEST NOT REPORTED DUE TO COLOR INTERFERENCE OF URINE PIGMENT*  PROTEINUR TEST NOT REPORTED DUE TO COLOR INTERFERENCE OF URINE PIGMENT*  NITRITE TEST NOT REPORTED  DUE TO COLOR INTERFERENCE OF  URINE PIGMENT*  LEUKOCYTESUR TEST NOT REPORTED DUE TO COLOR INTERFERENCE OF URINE PIGMENT*      Imaging: Dg Abd 1 View  Result Date: 10/06/2017 CLINICAL DATA:  Orogastric tube placement. EXAM: ABDOMEN - 1 VIEW COMPARISON:  06/01/2016. FINDINGS: Orogastric tube tip in the proximal stomach and side hole in the proximal to mid stomach. Gaseous distention of the stomach. Again demonstrated are 3 large gallstones in the gallbladder measuring up to 2.6 cm in maximum diameter each. No bowel gas visualized except for the gas in the stomach. Patchy opacity in the left lower lobe. Lumbar spine degenerative changes and minimal scoliosis. IMPRESSION: 1. Gaseous distention of the stomach with no other bowel gas seen. This is concerning for gastric outlet obstruction. 2. Cholelithiasis. 3. Patchy left lower lobe atelectasis or pneumonia. Electronically Signed   By: Claudie Revering M.D.   On: 10/06/2017 19:55   US Renal  Result Date: 10/06/2017 CLINICAL DATA:  66 year old male with acute renal failure. EXAM: RENAL / URINARY TRACT ULTRASOUND COMPLETE COMPARISON:  10/06/2016 ultrasound and 03/06/2017 MR FINDINGS: Right Kidney: Length: 12.1 cm. Diffuse increased echogenicity noted. No solid mass or hydronephrosis noted. Left Kidney: Length: 12.4 cm. Diffuse increased echogenicity noted. No solid mass or hydronephrosis identified. A 1.5 cm cyst is again identified. Bladder: A Foley catheter is present within a collapsed bladder. Incidental note is made of ascites. IMPRESSION: 1. Echogenic kidneys bilaterally compatible with medical renal disease. No evidence of hydronephrosis. 2. Ascites Electronically Signed   By: Margarette Canada M.D.   On: 10/06/2017 16:41   Dg Chest Port 1 View  Result Date: 10/08/2017 CLINICAL DATA:  Endotracheal tube placement. EXAM: PORTABLE CHEST 1 VIEW COMPARISON:  Oct 07, 2017 FINDINGS: Mediastinal contour is stable. The heart size is enlarged. Endotracheal tube is identified distal tip 4.9 cm from  carina. A left central venous line is identified distal tip in the superior vena cava. A nasogastric tube is noted with distal tip not included on film but is probably in the stomach. Mild patchy opacities identified in the left lung base. There is no pulmonary edema or pleural effusion. The visualized skeletal structures are stable. IMPRESSION: Life supporting devices as described. Endotracheal tube in good position. Cardiomegaly. Patchy opacity of the medial left lung base probably due to atelectasis but superimposed pneumonia is not excluded. Electronically Signed   By: Abelardo Diesel M.D.   On: 10/08/2017 08:03   Dg Chest Port 1 View  Result Date: 10/07/2017 CLINICAL DATA:  Respiratory failure EXAM: PORTABLE CHEST 1 VIEW COMPARISON:  Oct 06, 2017 FINDINGS: The ETT, left central line, and NG tube are again identified and stable within visualize limits. No pneumothorax. Stable cardiomegaly. The hila and mediastinum are unchanged. Mild opacity in the medial right lung base may represent atelectasis, vascular crowding, or subtle infiltrate. Recommend attention on follow-up. No other acute abnormalities. IMPRESSION: 1. Stable support apparatus as above. 2. Mild increased opacity in the medial right lung base could represent vascular crowding, atelectasis, or subtle infiltrate. Recommend attention on follow-up. Electronically Signed   By: Dorise Bullion III M.D   On: 10/07/2017 07:47   Dg Chest Port 1 View  Result Date: 10/06/2017 CLINICAL DATA:  Intubated. EXAM: PORTABLE CHEST 1 VIEW COMPARISON:  Earlier today. FINDINGS: Endotracheal tube tip 3.6 cm above the carina. Left jugular catheter unchanged. Stable enlarged cardiac silhouette and prominent interstitial markings. Mild increase in patchy opacity in the left lower lobe. Minimal right basilar atelectasis with  improvement. No acute bony abnormality. IMPRESSION: 1. Endotracheal tube tip 3.5 cm above the carina. This could be retracted 3.5 cm to place it at  the level of the clavicles. 2. Mildly increased patchy atelectasis or pneumonia in the left lower lobe. 3. Minimal right basilar atelectasis with improvement. 4. Stable cardiomegaly and chronic interstitial lung disease. Electronically Signed   By: Claudie Revering M.D.   On: 10/06/2017 19:53   Dg Chest Port 1 View  Result Date: 10/06/2017 CLINICAL DATA:  Encounter for central line placement. EXAM: PORTABLE CHEST 1 VIEW COMPARISON:  Radiograph of same day. FINDINGS: Stable cardiomegaly. Hypoinflation of the lungs is noted with mild bibasilar subsegmental atelectasis. Interval placement of left internal jugular dialysis catheter with distal tip in expected position of the SVC. No pneumothorax or significant pleural effusion is noted. Bony thorax is unremarkable. IMPRESSION: Interval placement of left internal jugular dialysis catheter with distal tip in expected position of SVC. No pneumothorax is noted. Hypoinflation of the lungs with mild bibasilar subsegmental atelectasis. Electronically Signed   By: Marijo Conception, M.D.   On: 10/06/2017 15:23   Dg Abd Portable 1v  Result Date: 10/08/2017 CLINICAL DATA:  Gastric outlet obstruction EXAM: PORTABLE ABDOMEN - 1 VIEW COMPARISON:  Portable exam 1118 hours compared to 10/06/2017 FINDINGS: Nasogastric tube coiled in stomach, which is decompressed. Air-filled loops of nonspecific small bowel mid abdomen. No bowel wall thickening. Paucity of colonic gas. Multiple calcified gallstones RIGHT upper quadrant. Bones demineralized. IMPRESSION: Cholelithiasis. Decompressed stomach versus prior exam. Nonspecific mildly dilated air-filled loops of small bowel in the mid abdomen with paucity of colonic gas, nonspecific, could reflect ileus or even potentially small-bowel obstruction; further imaging as clinically warranted. Electronically Signed   By: Lavonia Dana M.D.   On: 10/08/2017 11:29     Medications:   . sodium chloride 10 mL/hr at 10/08/17 1200  . dextrose 5 % and  0.45% NaCl 50 mL/hr at 10/08/17 1200  . meropenem (MERREM) IV Stopped (10/08/17 1235)  . norepinephrine (LEVOPHED) Adult infusion Stopped (10/08/17 1202)  . dialysis replacement fluid (prismasate) 500 mL/hr at 10/08/17 1114  . dialysis replacement fluid (prismasate) 200 mL/hr at 10/08/17 0116  . dialysate (PRISMASATE) 1,500 mL/hr at 10/08/17 1226  . vasopressin (PITRESSIN) infusion - *FOR SHOCK* Stopped (10/06/17 2331)   . chlorhexidine gluconate (MEDLINE KIT)  15 mL Mouth Rinse BID  . Chlorhexidine Gluconate Cloth  6 each Topical Daily  . mouth rinse  15 mL Mouth Rinse QID  . pantoprazole (PROTONIX) IV  40 mg Intravenous Q12H  . sodium chloride flush  10-40 mL Intracatheter Q12H   sodium chloride, fentaNYL (SUBLIMAZE) injection, heparin, midazolam, sodium chloride flush  Assessment/ Plan:   Acute renal insufficiency with possibility of septic shock  His urinalysis revealed >50 rbc and 21 - 50 wbc with many bacteria  Urine cultures showed enterobacter. I imagine that this urosepsis is the etiology of the acute renal failure. The renal ultrasound showed no evidence of hydronephrosis   He has been started on CRRT  Shock  Appears as though this has resolved and is now off pressors  Metabolic acidosis correcting with CRRT now has some overshoot metabolic alkalosis  Will change out the bicarbonate to 4/2.5 prismasate  Anemia  Hb 8.1 will continue to follow  Sepsis  ID enterobacter  Treated meropenum  Day # 1         LOS: 2 Severino Paolo W _0 _1 :55 PM

## 2017-10-08 NOTE — Progress Notes (Signed)
CRITICAL VALUE ALERT  Critical Value:  Ca = 6.4  Date & Time Notied:  10/08/17 @ 3343  Provider Notified: Dr Justin Mend  Orders Received/Actions taken: Change pre & post fluids

## 2017-10-08 NOTE — Progress Notes (Signed)
PULMONARY / CRITICAL CARE MEDICINE   Name: Patrick [name] MRN: [id number] DOB: 01-Mar-1951    ADMISSION DATE:  10/06/2017 CONSULTATION DATE:  5/10  REFERRING MD:  Sabra Heck   BRIEF:   This is a 67 yo chronically ill appearing wm.sig h/o CAD, svr ICM w/ EF 30-35%, remote colon cancer w/ perm end colostomy back in 2018/may. Also has h/o stage III-IV CKD. Resides at SNF (not clear when or why this started). He is WC bound. Presents to ED 5/10 after being found at SNF w/ altered LOC and several lab abnormalities including: cr 13.35,  K 5.7, and + AG metabolic acidosis w/; nml LA.  hgb 7.5 hypoglycemia and hypotension w/ SBP in 70s.  ER course: IVFs, 1 unit PRBCs, cultures obtained and empiric abx Remained hypotensive w/ best BP in 90s. Not able to pass CVL. PCCM asked to see for critical care services as well as to obtain access for anticipated need for urgent HD.   Prior platelent hx Results for Patrick [name], Patrick [name] (MRN [id number]) as of 10/07/2017 12:08  Ref. Range 05/23/2017 13:32 05/24/2017 06:04 05/25/2017 09:22 05/26/2017 06:48 10/06/2017 11:23  Platelets Latest Ref Range: 150 - 400 K/uL 111 (L) 123 (L) 114 (L) 109 (L) 32 (L)    PAST MEDICAL HISTORY :  He  has a past medical history of Arthritis, CAD (coronary artery disease), CHF (congestive heart failure) ([place]), Coronary artery disease involving native coronary artery of native heart with angina pectoris ([place]) (09/11/2016), Degenerative joint disease of knee, right, Dysrhythmia, Enlarged prostate, Hyperlipidemia, Hypertension, Pneumonia (05/2014), Prediabetes (10/03/2014), Scrotal edema (04/03/2015), and SMALL BOWEL OBSTRUCTION, HX OF (08/21/2007).  PAST SURGICAL HISTORY: He  has a past surgical history that includes Inguinal hernia repair (Left, 1990's); Cardiac catheterization (N/A, 02/16/2016); Colonoscopy (N/A, 04/01/2016); ir generic historical (07/05/2016); XI abdominal perineal resection (N/A, 09/28/2016); and IR Radiologist Eval & Mgmt  (03/29/2017).  EVENTS 10/06/2017 - admitted, intubated, On CRRT Echocardiogram 5/10 Blood cultures 5/10 Urine culture 5/10 - > 100 K enterobacter Vancomycin 5/10 Zosyn 5/10 Left IJ dialysis catheter 5/10  10/07/17 - On levophed 58mcg On vent 30% fio2, prn sedation Not making much urine. Can follow command but will get agitated. No blood in colostomy per GI and signed off  / SP PRBC - plat now 48. [place] command this AM  Results for Patrick [name], Patrick [name] (MRN [id number]) as of 10/07/2017 12:08  Ref. Range 10/06/2017 20:07  D-Dimer, Quant Latest Ref Range: 0.00 - 0.50 ug/mL-FEU 1.84 (H)  Fibrinogen Latest Ref Range: 210 - 475 mg/dL 356  Prothrombin Time Latest Ref Range: 11.4 - 15.2 seconds 24.0 (H)  INR Unknown 2.17  APTT Latest Ref Range: 24 - 36 seconds 53 (H)  Results for Patrick [name], Patrick [name] (MRN [id number]) as of 10/07/2017 12:08  Ref. Range 10/06/2017 20:07  Smear Review Unknown NO SCHISTOCYTES SEEN      SUBJECTIVE/OVERNIGHT/INTERVAL HX 10/08/17 - CRRT continues. On vent. Growing enterobacter in urine - on levophed 30mcg. FAiled SBT. PRn sedation - mostly sleepy but occ follows commands. AXR - ? GO obstruction but not much OG returns per RN and abd soft  VITAL SIGNS: BP 106/69   Pulse 73   Temp (!) 96.8 F (36 C)   Resp 20   Ht 5\' 6"  (1.676 m)   Wt 86.9 kg (191 lb 9.3 oz)   SpO2 100%   BMI 30.92 kg/m   HEMODYNAMICS:    VENTILATOR SETTINGS: Vent Mode: PRVC FiO2 (%):  [30 %] 30 % Set Rate:  [  22 bmp] 22 bmp Vt Set:  [510 mL-600 mL] 510 mL PEEP:  [5 cmH20] 5 cmH20 Plateau Pressure:  [14 cmH20-18 cmH20] 15 cmH20  INTAKE / OUTPUT: I/O last 3 completed shifts: In: 9892 [I.V.:1439; Blood:610; Other:480; NG/GT:140; IV Piggyback:1650] Out: 2894 [Urine:39; JJHER:7408; Stool:525]  PHYSICAL EXAMINATION:  General Appearance:    Looks criticall ill and deconditioned  Head:    Normocephalic, without obvious abnormality, atraumatic  Eyes:    PERRL - yes, conjunctiva/corneas - clear       Ears:    Normal external ear canals, both ears  Nose:   NG tube - no  Throat:  ETT TUBE - yes , OG tube - yes  Neck:   Supple,  No enlargement/tenderness/nodules     Lungs:     Clear to auscultation bilaterally, Ventilator   Synchrony - yes  Chest wall:    No deformity  Heart:    S1 and S2 normal, no murmur, CVP - no.  Pressors - yes levophed 51mcg  Abdomen:     Soft, no masses, no organomegaly. Has colostomy pouch  Genitalia:    Not done  Rectal:   not done  Extremities:   Extremities- intact     Skin:   Intact in exposed areas  But scattered bruises. Anal area - scarr per RN     Neurologic:   Sedation - prns -> RASS - -2 . Moves all 4s - yes. CAM-ICU - not tested . Orientation - not tested       PULMONARY Recent Labs  Lab 10/06/17 1818 10/06/17 2029 10/06/17 2048 10/07/17 0354  PHART 7.080* 7.127* 7.125* 7.454*  PCO2ART 28.6* 24.5* 30.7* 26.2*  PO2ART 114* 345.0* 53.0* 135.0*  HCO3 8.1* 8.3* 10.1* 18.4*  TCO2  --  9* 11* 19*  O2SAT 96.6 100.0 76.0 99.0    CBC Recent Labs  Lab 10/07/17 1543 10/07/17 2042 10/08/17 0409  HGB 7.9* 7.8* 8.2*  HCT 23.6* 23.5* 24.4*  WBC 4.8 4.2 4.4  PLT 45* 41* 39*    COAGULATION Recent Labs  Lab 10/06/17 1123 10/06/17 2007  INR 2.28 2.17    CARDIAC   Recent Labs  Lab 10/06/17 2007 10/07/17 0307  TROPONINI 0.33* 0.55*   No results for input(s): PROBNP in the last 168 hours.   CHEMISTRY Recent Labs  Lab 10/06/17 2007 10/07/17 0307 10/07/17 0345 10/07/17 1543 10/07/17 2358 10/08/17 0409  NA 136  137  --  139 138 136 138  K 5.7*  5.7*  --  3.9 3.2* 3.6 3.3*  CL 111  111  --  104 90* 82* 87*  CO2 <7*  9*  --  19* 31 39* 36*  GLUCOSE 99  103*  --  81 83 81 75  BUN 139*  135*  --  109* 71* 54* 47*  CREATININE 13.82*  13.60*  --  10.76* 7.25* 5.98* 5.25*  CALCIUM 7.7*  7.8*  --  7.3* 6.5* 6.4* 6.6*  MG 1.7 1.5*  --   --   --  1.9  PHOS 11.2* 8.0* 7.9* 4.6  --  2.8  2.8   Estimated Creatinine  Clearance: 13.7 mL/min (A) (by C-G formula based on SCr of 5.25 mg/dL (H)).   LIVER Recent Labs  Lab 10/06/17 1123 10/06/17 2007 10/07/17 0345 10/07/17 1543 10/08/17 0409  AST 9* 11*  9*  --   --   --   ALT 12* 11*  12*  --   --   --  ALKPHOS 100 94  94  --   --   --   BILITOT 1.5* 1.8*  1.6*  --   --   --   PROT 6.9 6.6  6.5  --   --   --   ALBUMIN 2.6* 2.5*  2.3* 2.2* 2.1* 2.0*  INR 2.28 2.17  --   --   --      INFECTIOUS Recent Labs  Lab 10/06/17 1205 10/06/17 2007  LATICACIDVEN 0.30*  --   PROCALCITON  --  0.27     ENDOCRINE CBG (last 3)  Recent Labs    10/08/17 0006 10/08/17 0410 10/08/17 0825  GLUCAP 73 69 73         IMAGING x48h  - image(s) personally visualized  -   highlighted in bold Dg Abd 1 View  Result Date: 10/06/2017 CLINICAL DATA:  Orogastric tube placement. EXAM: ABDOMEN - 1 VIEW COMPARISON:  06/01/2016. FINDINGS: Orogastric tube tip in the proximal stomach and side hole in the proximal to mid stomach. Gaseous distention of the stomach. Again demonstrated are 3 large gallstones in the gallbladder measuring up to 2.6 cm in maximum diameter each. No bowel gas visualized except for the gas in the stomach. Patchy opacity in the left lower lobe. Lumbar spine degenerative changes and minimal scoliosis. IMPRESSION: 1. Gaseous distention of the stomach with no other bowel gas seen. This is concerning for gastric outlet obstruction. 2. Cholelithiasis. 3. Patchy left lower lobe atelectasis or pneumonia. Electronically Signed   By: Claudie Revering M.D.   On: 10/06/2017 19:55   US Renal  Result Date: 10/06/2017 CLINICAL DATA:  67 year old male with acute renal failure. EXAM: RENAL / URINARY TRACT ULTRASOUND COMPLETE COMPARISON:  10/06/2016 ultrasound and 03/06/2017 MR FINDINGS: Right Kidney: Length: 12.1 cm. Diffuse increased echogenicity noted. No solid mass or hydronephrosis noted. Left Kidney: Length: 12.4 cm. Diffuse increased echogenicity noted. No  solid mass or hydronephrosis identified. A 1.5 cm cyst is again identified. Bladder: A Foley catheter is present within a collapsed bladder. Incidental note is made of ascites. IMPRESSION: 1. Echogenic kidneys bilaterally compatible with medical renal disease. No evidence of hydronephrosis. 2. Ascites Electronically Signed   By: Margarette Canada M.D.   On: 10/06/2017 16:41   Dg Chest Port 1 View  Result Date: 10/08/2017 CLINICAL DATA:  Endotracheal tube placement. EXAM: PORTABLE CHEST 1 VIEW COMPARISON:  Oct 07, 2017 FINDINGS: Mediastinal contour is stable. The heart size is enlarged. Endotracheal tube is identified distal tip 4.9 cm from carina. A left central venous line is identified distal tip in the superior vena cava. A nasogastric tube is noted with distal tip not included on film but is probably in the stomach. Mild patchy opacities identified in the left lung base. There is no pulmonary edema or pleural effusion. The visualized skeletal structures are stable. IMPRESSION: Life supporting devices as described. Endotracheal tube in good position. Cardiomegaly. Patchy opacity of the medial left lung base probably due to atelectasis but superimposed pneumonia is not excluded. Electronically Signed   By: Abelardo Diesel M.D.   On: 10/08/2017 08:03   Dg Chest Port 1 View  Result Date: 10/07/2017 CLINICAL DATA:  Respiratory failure EXAM: PORTABLE CHEST 1 VIEW COMPARISON:  Oct 06, 2017 FINDINGS: The ETT, left central line, and NG tube are again identified and stable within visualize limits. No pneumothorax. Stable cardiomegaly. The hila and mediastinum are unchanged. Mild opacity in the medial right lung base may represent atelectasis, vascular crowding, or subtle infiltrate.  Recommend attention on follow-up. No other acute abnormalities. IMPRESSION: 1. Stable support apparatus as above. 2. Mild increased opacity in the medial right lung base could represent vascular crowding, atelectasis, or subtle infiltrate.  Recommend attention on follow-up. Electronically Signed   By: Dorise Bullion III M.D   On: 10/07/2017 07:47   Dg Chest Port 1 View  Result Date: 10/06/2017 CLINICAL DATA:  Intubated. EXAM: PORTABLE CHEST 1 VIEW COMPARISON:  Earlier today. FINDINGS: Endotracheal tube tip 3.6 cm above the carina. Left jugular catheter unchanged. Stable enlarged cardiac silhouette and prominent interstitial markings. Mild increase in patchy opacity in the left lower lobe. Minimal right basilar atelectasis with improvement. No acute bony abnormality. IMPRESSION: 1. Endotracheal tube tip 3.5 cm above the carina. This could be retracted 3.5 cm to place it at the level of the clavicles. 2. Mildly increased patchy atelectasis or pneumonia in the left lower lobe. 3. Minimal right basilar atelectasis with improvement. 4. Stable cardiomegaly and chronic interstitial lung disease. Electronically Signed   By: Claudie Revering M.D.   On: 10/06/2017 19:53   Dg Chest Port 1 View  Result Date: 10/06/2017 CLINICAL DATA:  Encounter for central line placement. EXAM: PORTABLE CHEST 1 VIEW COMPARISON:  Radiograph of same day. FINDINGS: Stable cardiomegaly. Hypoinflation of the lungs is noted with mild bibasilar subsegmental atelectasis. Interval placement of left internal jugular dialysis catheter with distal tip in expected position of the SVC. No pneumothorax or significant pleural effusion is noted. Bony thorax is unremarkable. IMPRESSION: Interval placement of left internal jugular dialysis catheter with distal tip in expected position of SVC. No pneumothorax is noted. Hypoinflation of the lungs with mild bibasilar subsegmental atelectasis. Electronically Signed   By: Marijo Conception, M.D.   On: 10/06/2017 15:23   Dg Chest Portable 1 View  Result Date: 10/06/2017 CLINICAL DATA:  67 year old male with a history of hypotension EXAM: PORTABLE CHEST 1 VIEW COMPARISON:  05/23/2017, 10/08/2016 FINDINGS: Cardiomediastinal silhouette unchanged,  likely accentuated by apical lordotic positioning. Persisting cardiomegaly. No pneumothorax.  No large pleural effusion.  Low lung volumes. IMPRESSION: Redemonstration of cardiomegaly. The accentuated appearance is favored to be secondary to the apical lordotic positioning, however, new pericardial effusion not excluded. If there is concern for pericardial effusion, recommend correlation with ECHO. No evidence of lobar pneumonia or overt edema. Electronically Signed   By: Corrie Mckusick D.O.   On: 10/06/2017 12:15       DISCUSSION: Chronically ill 67 year old male patient resides a nursing home.  Appears to be deconditioned at baseline.  Review of medical records indicate he is wheelchair-bound.  Has a history of ischemic cardiomyopathy, atrial fibrillation CKD stage III.  Not clear if he is infected or simply hypovolemic.  Chest x-ray is clear.  Will ensure adequate volume resuscitation, initiate bicarbonate drip.  A dialysis catheter is been placed in case he needs dialysis.  Currently getting blood, volume, bicarbonate infusion.  We will place him on empiric antibiotics as we await culture data to return nephrology is been consulted.  ASSESSMENT / PLAN:  Acute encephalopathy superimposed on chronic illness and underlying deconditioning.  -Unclear what his baseline cognitive status is.  May have some degree of underlying depression, also wonder about element of dementia   10/08/17 - appears to have mild hypoactive delirium thought not formally tested today. Improving per [facility]  Plan Agree with below Continue prn sedation with RASS goal 0 to -2 Continue supportive care Correct his metabolic derangements Will need further history from family in  regards to baseline cognitive function Holding Wellbutrin   #CARDS Circulatory shock.  Presume hypovolemic versus sepsis.  Does have underlying cardiomyopathy with EF 30 to 35%.  Also current metabolic acidosis likely contributing to decreased cardiac  output -Favor hypovolemia and acidosis as likely contributing causes no clear source of infection except perhaps Urinary tract  ECHO similar tto baseline 10/07/17 - 35-40% EF  10/08/17 - septic shock - coming off pressorss  Plan Levophed for MAP > 65  Bradycardia with frequent pause, history of atrial fibrillation 5/12- HR 73  Plan        Holding apixaban given anemia and unclear etiology of shock Continue telemetry monitoring Holding Coreg   #RENAL  Acute on chronic renal failure.  With stage III/IV chronic kidney disease.  Baseline serum creatinine around 1.6.  Presenting to the ER with creatinine 14.42;   -5/12 - on CRRT . Not making urine. Enterobacter UTI. No evidence of TTP  Plan Per RENAL CRRT Holding allopurinol, Proscar, and Demadex  #HEME  Anemia has history of colon cancer.  Fecal occult blood positive today but was also positive a year ago.  Last hemoglobin actually 1 year ago per our records no obvious hemorrhage   10/07/17 - no visible bleeding. S/p PRBC  10/08/17 - anemia and thrombocytopenia continue. Likelys sepsis related  Plan - PRBC for hgb </= 6.9gm%    - exceptions are   -  if ACS susepcted/confirmed then transfuse for hgb </= 8.0gm%,  or    -  active bleeding with hemodynamic instability, then transfuse regardless of hemoglobin value   At at all times try to transfuse 1 unit prbc as possible with exception of active hemorrhage     Severe thrombocytopenia as well as borderline coagulopathy.  Marland Kitchen   DIC panel equivaocal but proably enterobacter UTI sepsis related. Marland Kitchen TTP - no indication given minimal jaundice, improing Cns features and no schistocytes   Plan Serial CBCs Transfuse for platelets less than 20 Trend INR Holding anticoagulation  INFECTIOIS DISEASE  A: Enterobacter UTI  P Narrow antibiotic to Merrem   FAMILY  - Updates:  None at bedside 10/07/17. None at bedsided 10/08/17  - Inter-disciplinary family meet or Palliative Care  meeting due by: [phone]      .  Rest per NP/medical resident whose note is outlined above and that I agree with  The patient is critically ill with multiple organ systems failure and requires high complexity decision making for assessment and support, frequent evaluation and titration of therapies, application of advanced monitoring technologies and extensive interpretation of multiple databases.   Critical Care Time devoted to patient care services described in this note is  30  Minutes. This time reflects time of care of this signee Dr Brand Males. This critical care time does not reflect procedure time, or teaching time or supervisory time of PA/NP/Med student/Med Resident etc but could involve care discussion time    Dr. Brand Males, M.D., [facility].C.P Pulmonary and Critical Care Medicine Staff Physician [place] [facility] Pager: [phone], If no answer or between  15:00h - 7:00h: call [phone]  10/08/2017 10:57 AM

## 2017-10-08 NOTE — Progress Notes (Signed)
   Sugar 67 AXR ok  Plan - d50 - vedrbal order given  - increase d5 water to 75cc - start trickle tube feed   Dr. Brand Males, M.D., [facility]C.P Pulmonary and Critical Care Medicine Staff Physician, [place] Director - Interstitial Lung Disease  Program  Pulmonary [place] [facility] [place], [place], [address]  Pager: [phone], If no answer or between  15:00h - 7:00h: call [phone] Telephone: [phone]

## 2017-10-09 ENCOUNTER — Inpatient Hospital Stay (HOSPITAL_COMMUNITY): Payer: Medicare Other

## 2017-10-09 LAB — BASIC METABOLIC PANEL
Anion gap: 8 (ref 5–15)
BUN: 26 mg/dL — AB (ref 6–20)
CHLORIDE: 95 mmol/L — AB (ref 101–111)
CO2: 31 mmol/L (ref 22–32)
CREATININE: 3.38 mg/dL — AB (ref 0.61–1.24)
Calcium: 7.2 mg/dL — ABNORMAL LOW (ref 8.9–10.3)
GFR calc Af Amer: 20 mL/min — ABNORMAL LOW (ref >60)
GFR calc non Af Amer: 17 mL/min — ABNORMAL LOW (ref >60)
Glucose, Bld: 100 mg/dL — ABNORMAL HIGH (ref 65–99)
Potassium: 3.9 mmol/L (ref 3.5–5.1)
SODIUM: 134 mmol/L — AB (ref 135–145)

## 2017-10-09 LAB — RENAL FUNCTION PANEL
ALBUMIN: 2.2 g/dL — AB (ref 3.5–5.0)
ANION GAP: 7 (ref 5–15)
Albumin: 2.2 g/dL — ABNORMAL LOW (ref 3.5–5.0)
Anion gap: 6 (ref 5–15)
BUN: 25 mg/dL — ABNORMAL HIGH (ref 6–20)
BUN: 20 mg/dL (ref 6–20)
CALCIUM: 7.9 mg/dL — AB (ref 8.9–10.3)
CO2: 31 mmol/L (ref 22–32)
CO2: 30 mmol/L (ref 22–32)
CREATININE: 3.14 mg/dL — AB (ref 0.61–1.24)
CREATININE: 2.64 mg/dL — AB (ref 0.61–1.24)
Calcium: 7.4 mg/dL — ABNORMAL LOW (ref 8.9–10.3)
Chloride: 98 mmol/L — ABNORMAL LOW (ref 101–111)
Chloride: 98 mmol/L — ABNORMAL LOW (ref 101–111)
GFR calc non Af Amer: 19 mL/min — ABNORMAL LOW (ref >60)
GFR calc non Af Amer: 23 mL/min — ABNORMAL LOW (ref >60)
GFR, EST AFRICAN AMERICAN: 22 mL/min — AB (ref >60)
GFR, EST AFRICAN AMERICAN: 27 mL/min — AB (ref >60)
GLUCOSE: 93 mg/dL (ref 65–99)
GLUCOSE: 81 mg/dL (ref 65–99)
PHOSPHORUS: 2.8 mg/dL (ref 2.5–4.6)
Phosphorus: 2.4 mg/dL — ABNORMAL LOW (ref 2.5–4.6)
Potassium: 4 mmol/L (ref 3.5–5.1)
Potassium: 3.9 mmol/L (ref 3.5–5.1)
SODIUM: 136 mmol/L (ref 135–145)
Sodium: 134 mmol/L — ABNORMAL LOW (ref 135–145)

## 2017-10-09 LAB — CBC WITH DIFFERENTIAL/PLATELET
BASOS ABS: 0 10*3/uL (ref 0.0–0.1)
BASOS PCT: 0 %
Eosinophils Absolute: 0.1 10*3/uL (ref 0.0–0.7)
Eosinophils Relative: 2 %
HEMATOCRIT: 24.9 % — AB (ref 39.0–52.0)
Hemoglobin: 8 g/dL — ABNORMAL LOW (ref 13.0–17.0)
LYMPHS PCT: 9 %
Lymphs Abs: 0.4 10*3/uL — ABNORMAL LOW (ref 0.7–4.0)
MCH: 27 pg (ref 26.0–34.0)
MCHC: 32.1 g/dL (ref 30.0–36.0)
MCV: 84.1 fL (ref 78.0–100.0)
MONO ABS: 0.8 10*3/uL (ref 0.1–1.0)
Monocytes Relative: 19 %
NEUTROS ABS: 3 10*3/uL (ref 1.7–7.7)
Neutrophils Relative %: 70 %
Platelets: 32 10*3/uL — ABNORMAL LOW (ref 150–400)
RBC: 2.96 MIL/uL — AB (ref 4.22–5.81)
RDW: 20.7 % — AB (ref 11.5–15.5)
WBC: 4.3 10*3/uL (ref 4.0–10.5)

## 2017-10-09 LAB — GLUCOSE, CAPILLARY
GLUCOSE-CAPILLARY: 79 mg/dL (ref 65–99)
Glucose-Capillary: 75 mg/dL (ref 65–99)
Glucose-Capillary: 78 mg/dL (ref 65–99)
Glucose-Capillary: 79 mg/dL (ref 65–99)
Glucose-Capillary: 83 mg/dL (ref 65–99)
Glucose-Capillary: 84 mg/dL (ref 65–99)
Glucose-Capillary: 90 mg/dL (ref 65–99)

## 2017-10-09 LAB — MAGNESIUM: Magnesium: 2.1 mg/dL (ref 1.7–2.4)

## 2017-10-09 LAB — MRSA PCR SCREENING: MRSA by PCR: POSITIVE — AB

## 2017-10-09 MED ORDER — DARBEPOETIN ALFA 200 MCG/0.4ML IJ SOSY
200.0000 ug | PREFILLED_SYRINGE | INTRAMUSCULAR | Status: DC
  Administered 2017-10-09: 200 ug via SUBCUTANEOUS
  Filled 2017-10-09 (×2): qty 0.4

## 2017-10-09 NOTE — Progress Notes (Signed)
[facility] ROUNDING NOTE   Subjective:   Interval History:  MS improving- no pressors, no UOP- started on d5 at 69 per hour for low sugars  Brief History  67 year old wheel chair bound patient from [facility], EF 35% baseline crt in the mid 1's  brought to the ER for acute encephalopathy.  He was found to be in acute renal failure with presumed septic shock and was started on CRRT 5/10 with a worsening acidosis, hyperkalemia and worsening respiratory status.     Objective:  Vital signs in last 24 hours:  Temp:  [96.4 F (35.8 C)-97.5 F (36.4 C)] 97.3 F (36.3 C) (05/13 0630) Pulse Rate:  [29-113] 72 (05/13 0630) Resp:  [18-25] 22 (05/13 0630) BP: (84-126)/(50-82) 110/66 (05/13 0630) SpO2:  [93 %-100 %] 99 % (05/13 0630) FiO2 (%):  [30 %] 30 % (05/13 0600) Weight:  [85.8 kg (189 lb 2.5 oz)] 85.8 kg (189 lb 2.5 oz) (05/13 0400)  Weight change: -1.1 kg (-2 lb 6.8 oz) Filed Weights   10/07/17 0500 10/08/17 0415 10/09/17 0400  Weight: 86 kg (189 lb 9.5 oz) 86.9 kg (191 lb 9.3 oz) 85.8 kg (189 lb 2.5 oz)    Intake/Output: I/O last 3 completed shifts: In: 2105.1 [I.V.:1241.6; Blood:30; Other:80; NG/GT:203.5; IV Piggyback:550] Out: 2566 [Urine:27; Other:1709; Stool:830]   Intake/Output this shift:  Total I/O In: 1220 [I.V.:930; NG/GT:190; IV Piggyback:100] Out: 1288 [Urine:10; Other:1178; Stool:100]  Alert - on vent- left IJ vascath placed 5/10 CVS- RRR  No rubs RS- CTA  JVP not elevated  ABD- BS  Somewhat distant  EXT- pitting edema to dep areas   Basic Metabolic Panel: Recent Labs  Lab 10/06/17 2007 10/07/17 0307 10/07/17 0345 10/07/17 1543 10/07/17 2358 10/08/17 0409 10/08/17 1552 10/08/17 2355 10/09/17 0350  NA 136  137  --  139 138 136 138 136 134* 136  K 5.7*  5.7*  --  3.9 3.2* 3.6 3.3* 3.8 3.9 4.0  CL 111  111  --  104 90* 82* 87* 95* 95* 98*  CO2 <7*  9*  --  19* 31 39* 36* 32 31 31  GLUCOSE 99  103*  --  81 83 81 75 99 100* 93   BUN 139*  135*  --  109* 71* 54* 47* 33* 26* 25*  CREATININE 13.82*  13.60*  --  10.76* 7.25* 5.98* 5.25* 3.96* 3.38* 3.14*  CALCIUM 7.7*  7.8*  --  7.3* 6.5* 6.4* 6.6* 7.1* 7.2* 7.4*  MG 1.7 1.5*  --   --   --  1.9  --   --  2.1  PHOS 11.2* 8.0* 7.9* 4.6  --  2.8  2.8 2.3*  --  2.4*    Liver Function Tests: Recent Labs  Lab 10/06/17 1123 10/06/17 2007 10/07/17 0345 10/07/17 1543 10/08/17 0409 10/08/17 1552 10/09/17 0350  AST 9* 11*  9*  --   --   --   --   --   ALT 12* 11*  12*  --   --   --   --   --   ALKPHOS 100 94  94  --   --   --   --   --   BILITOT 1.5* 1.8*  1.6*  --   --   --   --   --   PROT 6.9 6.6  6.5  --   --   --   --   --   ALBUMIN 2.6*  2.5*  2.3* 2.2* 2.1* 2.0* 2.0* 2.2*   No results for input(s): LIPASE, AMYLASE in the last 168 hours. No results for input(s): AMMONIA in the last 168 hours.  CBC: Recent Labs  Lab 10/06/17 1123  10/07/17 1042 10/07/17 1543 10/07/17 2042 10/08/17 0409 10/09/17 0350  WBC 4.2   < > 4.9 4.8 4.2 4.4 4.3  NEUTROABS 3.7  --   --   --   --  3.3 3.0  HGB 7.7*   < > 8.1* 7.9* 7.8* 8.2* 8.0*  HCT 23.4*   < > 23.7* 23.6* 23.5* 24.4* 24.9*  MCV 82.4   < > 79.3 78.9 79.4 80.5 84.1  PLT 32*   < > 48* 45* 41* 39* 32*   < > = values in this interval not displayed.       Medications:   . sodium chloride 10 mL/hr at 10/09/17 0600  . dextrose 75 mL/hr at 10/09/17 0605  . feeding supplement (VITAL AF 1.2 CAL) 10 mL/hr at 10/09/17 0600  . meropenem (MERREM) IV Stopped (10/09/17 0031)  . norepinephrine (LEVOPHED) Adult infusion Stopped (10/08/17 1202)  . dialysis replacement fluid (prismasate) 500 mL/hr at 10/08/17 2133  . dialysis replacement fluid (prismasate) 200 mL/hr at 10/09/17 0236  . dialysate (PRISMASATE) 1,500 mL/hr at 10/09/17 0519  . vasopressin (PITRESSIN) infusion - *FOR SHOCK* Stopped (10/06/17 2331)   . chlorhexidine gluconate (MEDLINE KIT)  15 mL Mouth Rinse BID  . Chlorhexidine Gluconate Cloth  6  each Topical Daily  . feeding supplement (PRO-STAT SUGAR FREE 64)  30 mL Per Tube BID  . mouth rinse  15 mL Mouth Rinse QID  . pantoprazole (PROTONIX) IV  40 mg Intravenous Q12H  . sodium chloride flush  10-40 mL Intracatheter Q12H   sodium chloride, fentaNYL (SUBLIMAZE) injection, heparin, midazolam, sodium chloride flush  Assessment/ Plan:   Acute renal insufficiency with possibility of septic shock  His urinalysis revealed >50 rbc and 21 - 50 wbc with many bacteria  Urine cultures showed enterobacter. I imagine that this urosepsis is the etiology of the acute renal failure. The renal ultrasound showed no evidence of hydronephrosis  Started on CRRT on 5/10 via left IJ vascath placed 5/10- now that off pressors will look to transition to IHD.  Will place order to stop CRRT if clots today   Shock  Appears as though this has resolved and is now off pressors- meropenam  Metabolic acidosis correcting with CRRT now has some overshoot metabolic alkalosis- resolved  Anemia  Hb 8.1 will continue to follow- give aranesp  Sepsis  ID enterobacter    Elytes- K is fine, would like to replete phos but cannot due to shortage- if stopping CRRT presume it will increase   Low sugar- on d5- try to decrease when able given edema       LOS: 3 Izela Altier A '@TODAY' '@6' :59 AM

## 2017-10-09 NOTE — Procedures (Signed)
Extubation Procedure Note  Patient Details:   Name: Patrick [name] DOB: [birth year] MRN: [id number]   Airway Documentation:    Vent end date: 10/09/17 Vent end time: 1234   Evaluation  O2 sats: stable throughout Complications: No apparent complications Patient did tolerate procedure well. Bilateral Breath Sounds: Clear, Diminished    Patient extubated to a 4L [place]. Cuff leak was heard. No stridor was noted. Patient tolerated well. RN at bedside with RT during extubation. Patient was able to perform IS. Yes  [name] 10/09/2017, 12:42 PM

## 2017-10-09 NOTE — Progress Notes (Signed)
PULMONARY / CRITICAL CARE MEDICINE   Name: Patrick [name] MRN: [id number] DOB: 16-Sep-1950    ADMISSION DATE:  10/06/2017 CONSULTATION DATE:  5/10  REFERRING MD:  Sabra Heck   BRIEF:   This is a 67 yo chronically ill appearing wm.sig h/o CAD, svr ICM w/ EF 30-35%, remote colon cancer w/ perm end colostomy back in 2018/may. Also has h/o stage III-IV CKD. Resides at SNF (not clear when or why this started). He is WC bound. Presents to ED 5/10 after being found at SNF w/ altered LOC and several lab abnormalities including: cr 13.35,  K 5.7, and + AG metabolic acidosis w/; nml LA.  hgb 7.5 hypoglycemia and hypotension w/ SBP in 70s.  ER course: IVFs, 1 unit PRBCs, cultures obtained and empiric abx Remained hypotensive w/ best BP in 90s. Not able to pass CVL. PCCM asked to see for critical care services as well as to obtain access for anticipated need for urgent HD.   Prior platelent hx Results for Patrick, [name] (MRN [id number]) as of 10/07/2017 12:08  Ref. Range 05/23/2017 13:32 05/24/2017 06:04 05/25/2017 09:22 05/26/2017 06:48 10/06/2017 11:23  Platelets Latest Ref Range: 150 - 400 K/uL 111 (L) 123 (L) 114 (L) 109 (L) 32 (L)    EVENTS 10/06/2017 - admitted, intubated, On CRRT Echocardiogram 5/10 Blood cultures 5/10 Urine culture 5/10 - > 100 K enterobacter Vancomycin 5/10 Zosyn 5/10 Left IJ dialysis catheter 5/10  10/07/17 - On levophed 67mcg On vent 30% fio2, prn sedation Not making much urine. Can follow command but will get agitated. No blood in colostomy per GI and signed off  / SP PRBC - plat now 48. Folloiwiung command this AM  Results for Patrick, [name] (MRN [id number]) as of 10/07/2017 12:08  Ref. Range 10/06/2017 20:07  D-Dimer, Quant Latest Ref Range: 0.00 - 0.50 ug/mL-FEU 1.84 (H)  Fibrinogen Latest Ref Range: 210 - 475 mg/dL 356  Prothrombin Time Latest Ref Range: 11.4 - 15.2 seconds 24.0 (H)  INR Unknown 2.17  APTT Latest Ref Range: 24 - 36 seconds 53 (H)  Results for Patrick, [name] (MRN  [id number]) as of 10/07/2017 12:08  Ref. Range 10/06/2017 20:07  Smear Review Unknown NO SCHISTOCYTES SEEN   SUBJECTIVE/OVERNIGHT/INTERVAL HX No events overnight, weaning, no new complaints  VITAL SIGNS: BP 116/72   Pulse 77   Temp (!) 97.3 F (36.3 C)   Resp 18   Ht 5\' 6"  (1.676 m)   Wt 189 lb 2.5 oz (85.8 kg)   SpO2 100%   BMI 30.53 kg/m   HEMODYNAMICS:    VENTILATOR SETTINGS: Vent Mode: PSV;CPAP FiO2 (%):  [30 %] 30 % Set Rate:  [22 bmp] 22 bmp Vt Set:  [510 mL-521 mL] 510 mL PEEP:  [5 cmH20] 5 cmH20 Pressure Support:  [5 cmH20] 5 cmH20 Plateau Pressure:  [12 cmH20-21 cmH20] 12 cmH20  INTAKE / OUTPUT: I/O last 3 completed shifts: In: 2892.9 [I.V.:2089.4; Other:50; NG/GT:403.5; IV Piggyback:350] Out: 3976 [Urine:37; BHALP:3790; Stool:930]  PHYSICAL EXAMINATION: General: Acute on chronically ill appearing male Neuro: arousable and following commands HEENT: [place]/AT, PERRL, EOM-I and MMM Heart: RRR, Nl S1/S2 and -M/R/G Lungs: Coarse BS diffusely Abdomen: Soft, NT, ND and +BS Ext: -edema and -tenderness Skin: Intact  PULMONARY Recent Labs  Lab 10/06/17 1818 10/06/17 2029 10/06/17 2048 10/07/17 0354  PHART 7.080* 7.127* 7.125* 7.454*  PCO2ART 28.6* 24.5* 30.7* 26.2*  PO2ART 114* 345.0* 53.0* 135.0*  HCO3 8.1* 8.3* 10.1* 18.4*  TCO2  --  9*  11* 19*  O2SAT 96.6 100.0 76.0 99.0   CBC Recent Labs  Lab 10/07/17 2042 10/08/17 0409 10/09/17 0350  HGB 7.8* 8.2* 8.0*  HCT 23.5* 24.4* 24.9*  WBC 4.2 4.4 4.3  PLT 41* 39* 32*   COAGULATION Recent Labs  Lab 10/06/17 1123 10/06/17 2007  INR 2.28 2.17   CARDIAC   Recent Labs  Lab 10/06/17 2007 10/07/17 0307  TROPONINI 0.33* 0.55*   No results for input(s): PROBNP in the last 168 hours.  CHEMISTRY Recent Labs  Lab 10/06/17 2007 10/07/17 0307 10/07/17 0345 10/07/17 1543 10/07/17 2358 10/08/17 0409 10/08/17 1552 10/08/17 2355 10/09/17 0350  NA 136  137  --  139 138 136 138 136 134* 136  K  5.7*  5.7*  --  3.9 3.2* 3.6 3.3* 3.8 3.9 4.0  CL 111  111  --  104 90* 82* 87* 95* 95* 98*  CO2 <7*  9*  --  19* 31 39* 36* 32 31 31  GLUCOSE 99  103*  --  81 83 81 75 99 100* 93  BUN 139*  135*  --  109* 71* 54* 47* 33* 26* 25*  CREATININE 13.82*  13.60*  --  10.76* 7.25* 5.98* 5.25* 3.96* 3.38* 3.14*  CALCIUM 7.7*  7.8*  --  7.3* 6.5* 6.4* 6.6* 7.1* 7.2* 7.4*  MG 1.7 1.5*  --   --   --  1.9  --   --  2.1  PHOS 11.2* 8.0* 7.9* 4.6  --  2.8  2.8 2.3*  --  2.4*   Estimated Creatinine Clearance: 22.8 mL/min (A) (by C-G formula based on SCr of 3.14 mg/dL (H)).  LIVER Recent Labs  Lab 10/06/17 1123 10/06/17 2007 10/07/17 0345 10/07/17 1543 10/08/17 0409 10/08/17 1552 10/09/17 0350  AST 9* 11*  9*  --   --   --   --   --   ALT 12* 11*  12*  --   --   --   --   --   ALKPHOS 100 94  94  --   --   --   --   --   BILITOT 1.5* 1.8*  1.6*  --   --   --   --   --   PROT 6.9 6.6  6.5  --   --   --   --   --   ALBUMIN 2.6* 2.5*  2.3* 2.2* 2.1* 2.0* 2.0* 2.2*  INR 2.28 2.17  --   --   --   --   --    INFECTIOUS Recent Labs  Lab 10/06/17 1205 10/06/17 2007  LATICACIDVEN 0.30*  --   PROCALCITON  --  0.27   ENDOCRINE CBG (last 3)  Recent Labs    10/09/17 0352 10/09/17 0803 10/09/17 1126  GLUCAP 83 75 84   IMAGING x48h  - image(s) personally visualized  -   highlighted in bold Dg Chest Port 1 View  Result Date: 10/09/2017 CLINICAL DATA:  Shortness of breath EXAM: PORTABLE CHEST 1 VIEW COMPARISON:  10/08/2017 FINDINGS: Cardiac shadow remains mildly enlarged. Endotracheal tube, nasogastric catheter and left jugular temporary dialysis catheter are again seen and stable. The lungs are hypoinflated. Some persistent left retrocardiac density is noted. No new focal abnormality is noted. IMPRESSION: No significant change from the previous day. Electronically Signed   By: Inez Catalina M.D.   On: 10/09/2017 07:10   Dg Chest Port 1 View  Result Date: 10/08/2017 CLINICAL DATA:  Endotracheal tube placement. EXAM: PORTABLE CHEST 1 VIEW COMPARISON:  Oct 07, 2017 FINDINGS: Mediastinal contour is stable. The heart size is enlarged. Endotracheal tube is identified distal tip 4.9 cm from carina. A left central venous line is identified distal tip in the superior vena cava. A nasogastric tube is noted with distal tip not included on film but is probably in the stomach. Mild patchy opacities identified in the left lung base. There is no pulmonary edema or pleural effusion. The visualized skeletal structures are stable. IMPRESSION: Life supporting devices as described. Endotracheal tube in good position. Cardiomegaly. Patchy opacity of the medial left lung base probably due to atelectasis but superimposed pneumonia is not excluded. Electronically Signed   By: Abelardo Diesel M.D.   On: 10/08/2017 08:03   Dg Abd Portable 1v  Result Date: 10/08/2017 CLINICAL DATA:  Gastric outlet obstruction EXAM: PORTABLE ABDOMEN - 1 VIEW COMPARISON:  Portable exam 1118 hours compared to 10/06/2017 FINDINGS: Nasogastric tube coiled in stomach, which is decompressed. Air-filled loops of nonspecific small bowel mid abdomen. No bowel wall thickening. Paucity of colonic gas. Multiple calcified gallstones RIGHT upper quadrant. Bones demineralized. IMPRESSION: Cholelithiasis. Decompressed stomach versus prior exam. Nonspecific mildly dilated air-filled loops of small bowel in the mid abdomen with paucity of colonic gas, nonspecific, could reflect ileus or even potentially small-bowel obstruction; further imaging as clinically warranted. Electronically Signed   By: Lavonia Dana M.D.   On: 10/08/2017 11:29   DISCUSSION: Chronically ill 67 year old male patient resides a nursing home.  Appears to be deconditioned at baseline.  Review of medical records indicate he is wheelchair-bound.  Has a history of ischemic cardiomyopathy, atrial fibrillation CKD stage III.  Not clear if he is infected or simply hypovolemic.  Chest  x-ray is clear.  Will ensure adequate volume resuscitation, initiate bicarbonate drip.  A dialysis catheter is been placed in case he needs dialysis.  Currently getting blood, volume, bicarbonate infusion.  We will place him on empiric antibiotics as we await culture data to return nephrology is been consulted.  ASSESSMENT / PLAN:  Acute encephalopathy superimposed on chronic illness and underlying deconditioning.  -Unclear what his baseline cognitive status is.  May have some degree of underlying depression, also wonder about element of dementia   Plan D/C sedation Supportive care Correct his metabolic derangements via CRRT Continue to hold Wellbutrin   CARDS Circulatory shock.  Presume hypovolemic versus sepsis.  Does have underlying cardiomyopathy with EF 30 to 35%.  Also current metabolic acidosis likely contributing to decreased cardiac output -Favor hypovolemia and acidosis as likely contributing causes no clear source of infection except perhaps Urinary tract  ECHO similar tto baseline 10/07/17 - 35-40% EF  Plan D/C pressors Tele monitoring  Bradycardia with frequent pause, history of atrial fibrillation 5/12- HR 73  Plan        Holding apixaban given anemia and unclear etiology of shock Continue telemetry monitoring Holding Coreg  RENAL Acute on chronic renal failure.  With stage III/IV chronic kidney disease.  Baseline serum creatinine around 1.6.  Presenting to the ER with creatinine 14.42;   Plan CRRT per renal Holding allopurinol, Proscar, and Demadex  HEME Anemia has history of colon cancer.  Fecal occult blood positive today but was also positive a year ago.  Last hemoglobin actually 1 year ago per our records no obvious hemorrhage   Plan CBC in AM Transfuse per ICU protocol  Severe thrombocytopenia as well as borderline coagulopathy.  Marland Kitchen   DIC panel equivaocal  but proably enterobacter UTI sepsis related. Marland Kitchen TTP - no indication given minimal jaundice, improing  Cns features and no schistocytes  Plan Serial CBCs Transfuse for platelets less than 20 Trend INR Holding anticoagulation  INFECTIOIS DISEASE  A:  Enterobacter UTI P Narrow antibiotic to Merrem  FAMILY  - Updates:  No family bedside 5/13  - Inter-disciplinary family meet or Palliative Care meeting due by: 62  The patient is critically ill with multiple organ systems failure and requires high complexity decision making for assessment and support, frequent evaluation and titration of therapies, application of advanced monitoring technologies and extensive interpretation of multiple databases.   Critical Care Time devoted to patient care services described in this note is  33  Minutes. This time reflects time of care of this signee Dr Jennet Maduro. This critical care time does not reflect procedure time, or teaching time or supervisory time of PA/NP/Med student/Med Resident etc but could involve care discussion time.  Rush Farmer, M.D. [facility] Pulmonary/Critical Care Medicine. Pager: [phone]. After hours pager: [phone].  10/09/2017 12:18 PM

## 2017-10-09 NOTE — Progress Notes (Signed)
CRITICAL VALUE ALERT  Critical Value:  MRSA positive  Date & Time Notied: 10/09/17 1517  Provider Notified: Marni Griffon, NP  Orders Received/Actions taken: Aware. No changes at this time.

## 2017-10-10 LAB — BPAM RBC
Blood Product Expiration Date: 201906012359
Blood Product Expiration Date: 201906012359
Blood Product Expiration Date: 201906012359
ISSUE DATE / TIME: 201905101234
ISSUE DATE / TIME: 201905110452
Unit Type and Rh: 9500
Unit Type and Rh: 9500
Unit Type and Rh: 9500

## 2017-10-10 LAB — TYPE AND SCREEN
ABO/RH(D): O NEG
Antibody Screen: NEGATIVE
Unit division: 0
Unit division: 0
Unit division: 0

## 2017-10-10 LAB — RENAL FUNCTION PANEL
ANION GAP: 10 (ref 5–15)
Albumin: 2.2 g/dL — ABNORMAL LOW (ref 3.5–5.0)
BUN: 23 mg/dL — ABNORMAL HIGH (ref 6–20)
CALCIUM: 8.2 mg/dL — AB (ref 8.9–10.3)
CO2: 29 mmol/L (ref 22–32)
Chloride: 96 mmol/L — ABNORMAL LOW (ref 101–111)
Creatinine, Ser: 3.29 mg/dL — ABNORMAL HIGH (ref 0.61–1.24)
GFR calc Af Amer: 21 mL/min — ABNORMAL LOW (ref >60)
GFR calc non Af Amer: 18 mL/min — ABNORMAL LOW (ref >60)
Glucose, Bld: 82 mg/dL (ref 65–99)
Phosphorus: 3.4 mg/dL (ref 2.5–4.6)
Potassium: 4.1 mmol/L (ref 3.5–5.1)
SODIUM: 135 mmol/L (ref 135–145)

## 2017-10-10 LAB — MAGNESIUM: Magnesium: 2.1 mg/dL (ref 1.7–2.4)

## 2017-10-10 LAB — GLUCOSE, CAPILLARY
GLUCOSE-CAPILLARY: 70 mg/dL (ref 65–99)
GLUCOSE-CAPILLARY: 71 mg/dL (ref 65–99)
Glucose-Capillary: 63 mg/dL — ABNORMAL LOW (ref 65–99)
Glucose-Capillary: 78 mg/dL (ref 65–99)
Glucose-Capillary: 68 mg/dL (ref 65–99)
Glucose-Capillary: 98 mg/dL (ref 65–99)
Glucose-Capillary: 73 mg/dL (ref 65–99)

## 2017-10-10 LAB — CBC WITH DIFFERENTIAL/PLATELET
BASOS PCT: 0 %
Basophils Absolute: 0 10*3/uL (ref 0.0–0.1)
Eosinophils Absolute: 0.1 10*3/uL (ref 0.0–0.7)
Eosinophils Relative: 2 %
HEMATOCRIT: 24.2 % — AB (ref 39.0–52.0)
Hemoglobin: 7.7 g/dL — ABNORMAL LOW (ref 13.0–17.0)
LYMPHS ABS: 0.7 10*3/uL (ref 0.7–4.0)
Lymphocytes Relative: 17 %
MCH: 27.4 pg (ref 26.0–34.0)
MCHC: 31.8 g/dL (ref 30.0–36.0)
MCV: 86.1 fL (ref 78.0–100.0)
MONOS PCT: 12 %
Monocytes Absolute: 0.5 10*3/uL (ref 0.1–1.0)
NEUTROS ABS: 3.1 10*3/uL (ref 1.7–7.7)
Neutrophils Relative %: 69 %
Platelets: 33 10*3/uL — ABNORMAL LOW (ref 150–400)
RBC: 2.81 MIL/uL — ABNORMAL LOW (ref 4.22–5.81)
RDW: 20.7 % — AB (ref 11.5–15.5)
WBC: 4.4 10*3/uL (ref 4.0–10.5)

## 2017-10-10 MED ORDER — ORAL CARE MOUTH RINSE
15.0000 mL | Freq: Two times a day (BID) | OROMUCOSAL | Status: DC
  Administered 2017-10-10 – 2017-10-14 (×8): 15 mL via OROMUCOSAL

## 2017-10-10 MED ORDER — ROPINIROLE HCL 0.25 MG PO TABS
0.2500 mg | ORAL_TABLET | Freq: Once | ORAL | Status: AC
  Administered 2017-10-11: 0.25 mg via ORAL
  Filled 2017-10-10 (×2): qty 1

## 2017-10-10 MED ORDER — PANTOPRAZOLE SODIUM 40 MG PO TBEC
40.0000 mg | DELAYED_RELEASE_TABLET | Freq: Every day | ORAL | Status: DC
  Administered 2017-10-11 – 2017-10-26 (×16): 40 mg via ORAL
  Filled 2017-10-10 (×16): qty 1

## 2017-10-10 MED ORDER — MUPIROCIN 2 % EX OINT
1.0000 "application " | TOPICAL_OINTMENT | Freq: Two times a day (BID) | CUTANEOUS | Status: AC
  Administered 2017-10-10 – 2017-10-14 (×10): 1 via NASAL
  Filled 2017-10-10 (×3): qty 22

## 2017-10-10 MED ORDER — SODIUM CHLORIDE 0.9 % IV SOLN
500.0000 mg | INTRAVENOUS | Status: AC
  Administered 2017-10-10 – 2017-10-14 (×5): 500 mg via INTRAVENOUS
  Filled 2017-10-10 (×5): qty 0.5

## 2017-10-10 MED ORDER — FUROSEMIDE 10 MG/ML IJ SOLN
160.0000 mg | Freq: Once | INTRAVENOUS | Status: AC
  Administered 2017-10-10: 160 mg via INTRAVENOUS
  Filled 2017-10-10: qty 16

## 2017-10-10 MED ORDER — CHLORHEXIDINE GLUCONATE CLOTH 2 % EX PADS: 6.0000 | MEDICATED_PAD | Freq: Every day | CUTANEOUS | Status: DC

## 2017-10-10 NOTE — Progress Notes (Signed)
CBG 63 orange juice given will recheck CBG

## 2017-10-10 NOTE — Progress Notes (Signed)
eLink Physician-Brief Progress Note Patient Name: JOREL GRAVLIN DOB: 1951/02/09 MRN: [id number]   Date of Service  10/10/2017  HPI/Events of Note  Patient c/o leg cramps.  eICU Interventions  Will order: 1.Requip 0.25 mg PO X 1 now.      Intervention Category Major Interventions: Other:  Lysle Dingwall 10/10/2017, 11:51 PM

## 2017-10-10 NOTE — Progress Notes (Signed)
Pharmacy Antibiotic Note  Patrick [name] is a 67 y.o. male admitted on 10/06/2017 with sepsis. Pt growing enterobacter resistant to zosyn in urine on Meropenem. Pt is afebrile and WBC is WNL.   CRRT stopped at [facility] on 5/13. No HD today. Essentially no UOP. Trial of Lasix 160 mg IV x1. Plan for HD possibly tomorrow.   Plan: Change Merrem to 500 mg IV every 24 hours.  Stop date in place for 5/18 (7 days of therapy).  Follow-up renal plans for HD vs observation.   Height: 5\' 6"  (167.6 cm) Weight: 187 lb 13.3 oz (85.2 kg) IBW/kg (Calculated) : 63.8  Temp (24hrs), Avg:97.8 F (36.6 C), Min:94.4 F (34.7 C), Max:99.1 F (37.3 C)  Recent Labs  Lab 10/06/17 1205  10/07/17 1543 10/07/17 2042  10/08/17 0409 10/08/17 1552 10/08/17 2355 10/09/17 0350 10/09/17 1545 10/10/17 0339  WBC  --    < > 4.8 4.2  --  4.4  --   --  4.3  --  4.4  CREATININE  --    < > 7.25*  --    < > 5.25* 3.96* 3.38* 3.14* 2.64* 3.29*  LATICACIDVEN 0.30*  --   --   --   --   --   --   --   --   --   --    < > = values in this interval not displayed.    Estimated Creatinine Clearance: 21.7 mL/min (A) (by C-G formula based on SCr of 3.29 mg/dL (H)).    Allergies  Allergen Reactions  . Nsaids     Kidney disease    Antimicrobials this admission: Meropenem 5/12>> Ceftriaxone 5/10 x 1 Zosyn 5/10>>5/12 Vanc 5/10>>5/12  5/10 blood x 2 - NGTD 5/10 urine - enterobacter (R-Zosyn)  Thank you for allowing pharmacy to be a part of this patient's care.  Sloan Leiter, PharmD, BCPS, BCCCP Clinical Pharmacist Clinical phone 10/10/2017 until 3:30PM310 [phone] After hours, please call #[phone] 10/10/2017 10:27 AM

## 2017-10-10 NOTE — Progress Notes (Signed)
Patient transferred to 2C01 via bed with monitor He tolerated transfer well

## 2017-10-10 NOTE — Evaluation (Signed)
Physical Therapy Evaluation Patient Details Name: Patrick [name] MRN: [id number] DOB: 1950/08/20 Today's Date: 10/10/2017   History of Present Illness  Pt adm with acute metabolic encephalopathy, shock, and acute on chronic kidney failure. Pt intubated 5/10-5/13 and on CRRT 5/10-5/13. PMH - Rectal CA, colostomy, CAD, afib, HTN, heart failure, ckd  Clinical Impression  Pt admitted with above diagnosis and presents to PT with functional limitations due to deficits listed below (See PT problem list). Pt needs skilled PT to maximize independence and safety to allow discharge back to SNF.      Follow Up Recommendations SNF    Equipment Recommendations  None recommended by PT    Recommendations for Other Services       Precautions / Restrictions Precautions Precautions: Fall Restrictions Weight Bearing Restrictions: No      Mobility  Bed Mobility Overal bed mobility: Needs Assistance Bed Mobility: Supine to Sit     Supine to sit: Max assist     General bed mobility comments: Assist to bring legs off of bed, elevate trunk into sitting, and bring hips to EOB  Transfers Overall transfer level: Needs assistance Equipment used: Rolling walker (2 wheeled) Transfers: Sit to/from Stand;Lateral/Scoot Transfers Sit to Stand: Mod assist;Max assist        Lateral/Scoot Transfers: Mod assist General transfer comment: Initial stand from bed with walker required max assist. Subsequent stands from bed required mod assist. Assist to bring hips and trunk up. Pt  with flexed knees, hips, trunk. Pt stood with walker x 3 but unable to take pivotal steps to chair. Then performed scoot transfer. Required frequent verbal/tactile cues with transfer with pt having difficulty with motor planning of scooting.   Ambulation/Gait             General Gait Details: Unable  Stairs            Wheelchair Mobility    Modified Rankin (Stroke Patients Only)       Balance Overall balance  assessment: Needs assistance Sitting-balance support: No upper extremity supported;Feet supported Sitting balance-Leahy Scale: Fair     Standing balance support: Bilateral upper extremity supported Standing balance-Leahy Scale: Poor Standing balance comment: stood x 3 with walker for 10-25 seconds with mod assist to maintain.                             Pertinent Vitals/Pain Pain Assessment: No/denies pain    Home Living Family/patient expects to be discharged to:: Skilled nursing facility                      Prior Function Level of Independence: Needs assistance   Gait / Transfers Assistance Needed: Pt confused and unable to accurately describe function prior to hospitalization. Was ambulatory with assist during hospitalization Dec 2018.            Hand Dominance   Dominant Hand: Right    Extremity/Trunk Assessment   Upper Extremity Assessment Upper Extremity Assessment: Generalized weakness    Lower Extremity Assessment Lower Extremity Assessment: Generalized weakness;RLE deficits/detail;LLE deficits/detail RLE Deficits / Details: Knees with slight flexion contractures (~10 degrees) LLE Deficits / Details: Knees with slight flexion contractures (~10 degrees)       Communication   Communication: No difficulties  Cognition Arousal/Alertness: Awake/alert Behavior During Therapy: WFL for tasks assessed/performed Overall Cognitive Status: Impaired/Different from baseline Area of Impairment: Orientation;Attention;Memory;Following commands;Problem solving;Awareness  Orientation Level: Disoriented to;Place;Time;Situation Current Attention Level: Sustained Memory: Decreased recall of precautions;Decreased short-term memory Following Commands: Follows one step commands consistently;Follows one step commands with increased time   Awareness: Intellectual Problem Solving: Slow processing;Decreased initiation;Difficulty  sequencing;Requires verbal cues;Requires tactile cues        General Comments      Exercises     Assessment/Plan    PT Assessment Patient needs continued PT services  PT Problem List Decreased strength;Decreased balance;Decreased activity tolerance;Decreased range of motion;Decreased mobility;Decreased cognition;Decreased knowledge of use of DME       PT Treatment Interventions DME instruction;Gait training;Functional mobility training;Therapeutic activities;Therapeutic exercise;Balance training;Cognitive remediation;Patient/family education    PT Goals (Current goals can be found in the Care Plan section)  Acute Rehab PT Goals Patient Stated Goal: Pt unable to state PT Goal Formulation: Patient unable to participate in goal setting Time For Goal Achievement: 10/24/17 Potential to Achieve Goals: Fair    Frequency Min 2X/week   Barriers to discharge        Co-evaluation               AM-PAC PT "6 Clicks" Daily Activity  Outcome Measure Difficulty turning over in bed (including adjusting bedclothes, sheets and blankets)?: Unable Difficulty moving from lying on back to sitting on the side of the bed? : Unable Difficulty sitting down on and standing up from a chair with arms (e.g., wheelchair, bedside commode, etc,.)?: Unable Help needed moving to and from a bed to chair (including a wheelchair)?: Total Help needed walking in hospital room?: Total Help needed climbing 3-5 steps with a railing? : Total 6 Click Score: 6    End of Session Equipment Utilized During Treatment: Gait belt Activity Tolerance: Patient tolerated treatment well Patient left: in chair;with call bell/phone within reach;with chair alarm set Nurse Communication: Mobility status;Need for lift equipment(suggested Denna Haggard for back to bed) PT Visit Diagnosis: Other abnormalities of gait and mobility (R26.89);Difficulty in walking, not elsewhere classified (R26.2);Muscle weakness (generalized)  (M62.81)    Time: 7121-9758 PT Time Calculation (min) (ACUTE ONLY): 37 min   Charges:   PT Evaluation $PT Eval Moderate Complexity: 1 Mod PT Treatments $Therapeutic Activity: 8-22 mins   PT G CodesMarland Kitchen        [facility] PT [place] 10/10/2017, 12:25 PM

## 2017-10-10 NOTE — Progress Notes (Signed)
Subjective:  Pt extubated yest and CRRT was stopped at around 6 PM- hemodynamically stable but confused - essentially no UOP   Objective Vital signs in last 24 hours: Vitals:   10/10/17 0340 10/10/17 0400 10/10/17 0430 10/10/17 0500  BP:  109/65 123/74 (!) 87/51  Pulse: 81 85 82 80  Resp: 18 20 (!) 21 (!) 22  Temp: 99 F (37.2 C) 99 F (37.2 C) 99.1 F (37.3 C) 99.1 F (37.3 C)  TempSrc:  Bladder    SpO2: 95% 96% 95% 94%  Weight: 85.2 kg (187 lb 13.3 oz)     Height:       Weight change: -0.6 kg (-1 lb 5.2 oz)  Intake/Output Summary (Last 24 hours) at 10/10/2017 0642 Last data filed at 10/10/2017 0500 Gross per 24 hour  Intake 2214.66 ml  Output 1378 ml  Net 836.66 ml    Assessment/ Plan: Pt is a 67 y.o. yo male NHP, EF 35% baseline CKD- crt mid 1's who was admitted on 10/06/2017 with urosepsis with AKI  Assessment/Plan: 1. Renal- A on CKD- thought due to ATN/septic shock- req initiation of CRRT on 5/10- stopped on 5/13.  Still no UOP but no indications for HD today, will plan on IHD tomorrow.  Vascath placed 5/10 2. Urosepsis- enterobacter- meropenam  3. Anemia- on ESA- no iron due to infection- supportive care, transfuse as needed, hgb 7.7 4. Elytes- K , phos and Ca OK 5. HTN/volume- has pitting edema but also likely third spacing- not making urine so any removal will need to be via UF- on room air- will try one dose of lasix today- still needing D5 for low sugars   , A    Labs: Basic Metabolic Panel: Recent Labs  Lab 10/09/17 0350 10/09/17 1545 10/10/17 0339  NA 136 134* 135  K 4.0 3.9 4.1  CL 98* 98* 96*  CO2 31 30 29  GLUCOSE 93 81 82  BUN 25* 20 23*  CREATININE 3.14* 2.64* 3.29*  CALCIUM 7.4* 7.9* 8.2*  PHOS 2.4* 2.8 3.4   Liver Function Tests: Recent Labs  Lab 10/06/17 1123 10/06/17 2007  10/09/17 0350 10/09/17 1545 10/10/17 0339  AST 9* 11*  9*  --   --   --   --   ALT 12* 11*  12*  --   --   --   --   ALKPHOS 100 94  94  --    --   --   --   BILITOT 1.5* 1.8*  1.6*  --   --   --   --   PROT 6.9 6.6  6.5  --   --   --   --   ALBUMIN 2.6* 2.5*  2.3*   < > 2.2* 2.2* 2.2*   < > = values in this interval not displayed.   No results for input(s): LIPASE, AMYLASE in the last 168 hours. No results for input(s): AMMONIA in the last 168 hours. CBC: Recent Labs  Lab 10/07/17 1543 10/07/17 2042 10/08/17 0409 10/09/17 0350 10/10/17 0339  WBC 4.8 4.2 4.4 4.3 4.4  NEUTROABS  --   --  3.3 3.0 3.1  HGB 7.9* 7.8* 8.2* 8.0* 7.7*  HCT 23.6* 23.5* 24.4* 24.9* 24.2*  MCV 78.9 79.4 80.5 84.1 86.1  PLT 45* 41* 39* 32* 33*   Cardiac Enzymes: Recent Labs  Lab 10/06/17 2007 10/07/17 0307  TROPONINI 0.33* 0.55*   CBG: Recent Labs  Lab 10/09/17 1126 10/09/17 1600 10/09/17 1958   10/09/17 2326 10/10/17 0345  GLUCAP 84 78 79 79 70    Iron Studies: No results for input(s): IRON, TIBC, TRANSFERRIN, FERRITIN in the last 72 hours. Studies/Results: Dg Chest Port 1 View  Result Date: 10/09/2017 CLINICAL DATA:  Shortness of breath EXAM: PORTABLE CHEST 1 VIEW COMPARISON:  10/08/2017 FINDINGS: Cardiac shadow remains mildly enlarged. Endotracheal tube, nasogastric catheter and left jugular temporary dialysis catheter are again seen and stable. The lungs are hypoinflated. Some persistent left retrocardiac density is noted. No new focal abnormality is noted. IMPRESSION: No significant change from the previous day. Electronically Signed   By: Mark  Lukens M.D.   On: 10/09/2017 07:10   Dg Abd Portable 1v  Result Date: 10/08/2017 CLINICAL DATA:  Gastric outlet obstruction EXAM: PORTABLE ABDOMEN - 1 VIEW COMPARISON:  Portable exam 1118 hours compared to 10/06/2017 FINDINGS: Nasogastric tube coiled in stomach, which is decompressed. Air-filled loops of nonspecific small bowel mid abdomen. No bowel wall thickening. Paucity of colonic gas. Multiple calcified gallstones RIGHT upper quadrant. Bones demineralized. IMPRESSION:  Cholelithiasis. Decompressed stomach versus prior exam. Nonspecific mildly dilated air-filled loops of small bowel in the mid abdomen with paucity of colonic gas, nonspecific, could reflect ileus or even potentially small-bowel obstruction; further imaging as clinically warranted. Electronically Signed   By: Mark  Boles M.D.   On: 10/08/2017 11:29   Medications: Infusions: . sodium chloride Stopped (10/09/17 2117)  . dextrose 75 mL/hr at 10/10/17 0500  . meropenem (MERREM) IV Stopped (10/09/17 2347)    Scheduled Medications: . chlorhexidine gluconate (MEDLINE KIT)  15 mL Mouth Rinse BID  . Chlorhexidine Gluconate Cloth  6 each Topical Daily  . darbepoetin (ARANESP) injection - NON-DIALYSIS  200 mcg Subcutaneous Q Mon-1800  . mouth rinse  15 mL Mouth Rinse QID  . pantoprazole (PROTONIX) IV  40 mg Intravenous Q12H  . sodium chloride flush  10-40 mL Intracatheter Q12H    have reviewed scheduled and prn medications.  Physical Exam: General: alert, upset at consequence Heart: RRR Lungs: mostly clear Abdomen: soft, non tender Extremities: pitting dep edema Dialysis Access: left IJ vascath     10/10/2017,6:42 AM  LOS: 4 days         

## 2017-10-10 NOTE — Progress Notes (Signed)
Report called to [facility]

## 2017-10-10 NOTE — Progress Notes (Signed)
PULMONARY / CRITICAL CARE MEDICINE   Name: Patrick [name] MRN: [id number] DOB: 1950/06/01    ADMISSION DATE:  10/06/2017 CONSULTATION DATE:  5/10  REFERRING MD:  Sabra Heck   BRIEF:   This is a 67 yo chronically ill appearing wm.sig h/o CAD, svr ICM w/ EF 30-35%, remote colon cancer w/ perm end colostomy back in 2018/may. Also has h/o stage III-IV CKD. Resides at SNF (not clear when or why this started). He is WC bound. Presents to ED 5/10 after being found at SNF w/ altered LOC and several lab abnormalities including: cr 13.35,  K 5.7, and + AG metabolic acidosis w/; nml LA.  hgb 7.5 hypoglycemia and hypotension w/ SBP in 70s.  ER course: IVFs, 1 unit PRBCs, cultures obtained and empiric abx Remained hypotensive w/ best BP in 90s. Not able to pass CVL. PCCM asked to see for critical care services as well as to obtain access for anticipated need for urgent HD.   Prior platelent hx Results for Patrick [name] (MRN [id number]) as of 10/07/2017 12:08  Ref. Range 05/23/2017 13:32 05/24/2017 06:04 05/25/2017 09:22 05/26/2017 06:48 10/06/2017 11:23  Platelets Latest Ref Range: 150 - 400 K/uL 111 (L) 123 (L) 114 (L) 109 (L) 32 (L)    EVENTS 10/06/2017 - admitted, intubated, On CRRT Echocardiogram 5/10 Blood cultures 5/10 Urine culture 5/10 - > 100 K enterobacter Vancomycin 5/10 Zosyn 5/10 Left IJ dialysis catheter 5/10   Results for Patrick [name] (MRN [id number]) as of 10/07/2017 12:08  Ref. Range 10/06/2017 20:07  D-Dimer, Quant Latest Ref Range: 0.00 - 0.50 ug/mL-FEU 1.84 (H)  Fibrinogen Latest Ref Range: 210 - 475 mg/dL 356  Prothrombin Time Latest Ref Range: 11.4 - 15.2 seconds 24.0 (H)  INR Unknown 2.17  APTT Latest Ref Range: 24 - 36 seconds 53 (H)  Results for Patrick [name] (MRN [id number]) as of 10/07/2017 12:08  Ref. Range 10/06/2017 20:07  Smear Review Unknown NO SCHISTOCYTES SEEN   SUBJECTIVE/OVERNIGHT/INTERVAL HX Tolerated extubation, confused but no new complaints  VITAL SIGNS: BP 117/65    Pulse 83   Temp 99.1 F (37.3 C)   Resp (!) 27   Ht 5\' 6"  (1.676 m)   Wt 187 lb 13.3 oz (85.2 kg)   SpO2 97%   BMI 30.32 kg/m   HEMODYNAMICS:   VENTILATOR SETTINGS: Extubated on RA  INTAKE / OUTPUT: I/O last 3 completed shifts: In: 3509.7 [I.V.:2875.8; Other:60; NG/GT:273.8; IV Piggyback:300] Out: 2676 [Urine:45; TKZSW:1093; Stool:265]  PHYSICAL EXAMINATION: General: Chronically ill appearing Neuro: arousable and following commands HEENT: [place]/AT, PERRL, EOM-I and MMM Heart: RRR, Nl S1/S2 and -M/R/G Lungs: Coarse BS diffusely Abdomen: Soft, NT, ND and +BS Ext: -edema and -tenderness Skin: Intact  PULMONARY Recent Labs  Lab 10/06/17 1818 10/06/17 2029 10/06/17 2048 10/07/17 0354  PHART 7.080* 7.127* 7.125* 7.454*  PCO2ART 28.6* 24.5* 30.7* 26.2*  PO2ART 114* 345.0* 53.0* 135.0*  HCO3 8.1* 8.3* 10.1* 18.4*  TCO2  --  9* 11* 19*  O2SAT 96.6 100.0 76.0 99.0   CBC Recent Labs  Lab 10/08/17 0409 10/09/17 0350 10/10/17 0339  HGB 8.2* 8.0* 7.7*  HCT 24.4* 24.9* 24.2*  WBC 4.4 4.3 4.4  PLT 39* 32* 33*   COAGULATION Recent Labs  Lab 10/06/17 1123 10/06/17 2007  INR 2.28 2.17   CARDIAC   Recent Labs  Lab 10/06/17 2007 10/07/17 0307  TROPONINI 0.33* 0.55*   No results for input(s): PROBNP in the last 168 hours.  CHEMISTRY Recent Labs  Lab 10/06/17 2007 10/07/17 0307  10/08/17 0409 10/08/17 1552 10/08/17 2355 10/09/17 0350 10/09/17 1545 10/10/17 0339  NA 136  137  --    < > 138 136 134* 136 134* 135  K 5.7*  5.7*  --    < > 3.3* 3.8 3.9 4.0 3.9 4.1  CL 111  111  --    < > 87* 95* 95* 98* 98* 96*  CO2 <7*  9*  --    < > 36* 32 31 31 30 29   GLUCOSE 99  103*  --    < > 75 99 100* 93 81 82  BUN 139*  135*  --    < > 47* 33* 26* 25* 20 23*  CREATININE 13.82*  13.60*  --    < > 5.25* 3.96* 3.38* 3.14* 2.64* 3.29*  CALCIUM 7.7*  7.8*  --    < > 6.6* 7.1* 7.2* 7.4* 7.9* 8.2*  MG 1.7 1.5*  --  1.9  --   --  2.1  --  2.1  PHOS 11.2* 8.0*   <  > 2.8  2.8 2.3*  --  2.4* 2.8 3.4   < > = values in this interval not displayed.   Estimated Creatinine Clearance: 21.7 mL/min (A) (by C-G formula based on SCr of 3.29 mg/dL (H)).  LIVER Recent Labs  Lab 10/06/17 1123 10/06/17 2007  10/08/17 0409 10/08/17 1552 10/09/17 0350 10/09/17 1545 10/10/17 0339  AST 9* 11*  9*  --   --   --   --   --   --   ALT 12* 11*  12*  --   --   --   --   --   --   ALKPHOS 100 94  94  --   --   --   --   --   --   BILITOT 1.5* 1.8*  1.6*  --   --   --   --   --   --   PROT 6.9 6.6  6.5  --   --   --   --   --   --   ALBUMIN 2.6* 2.5*  2.3*   < > 2.0* 2.0* 2.2* 2.2* 2.2*  INR 2.28 2.17  --   --   --   --   --   --    < > = values in this interval not displayed.   INFECTIOUS Recent Labs  Lab 10/06/17 1205 10/06/17 2007  LATICACIDVEN 0.30*  --   PROCALCITON  --  0.27   ENDOCRINE CBG (last 3)  Recent Labs    10/10/17 0345 10/10/17 0818 10/10/17 0948  GLUCAP 70 63* 78   IMAGING x48h  - image(s) personally visualized  -   highlighted in bold Dg Chest Port 1 View  Result Date: 10/09/2017 CLINICAL DATA:  Shortness of breath EXAM: PORTABLE CHEST 1 VIEW COMPARISON:  10/08/2017 FINDINGS: Cardiac shadow remains mildly enlarged. Endotracheal tube, nasogastric catheter and left jugular temporary dialysis catheter are again seen and stable. The lungs are hypoinflated. Some persistent left retrocardiac density is noted. No new focal abnormality is noted. IMPRESSION: No significant change from the previous day. Electronically Signed   By: Inez Catalina M.D.   On: 10/09/2017 07:10   Dg Abd Portable 1v  Result Date: 10/08/2017 CLINICAL DATA:  Gastric outlet obstruction EXAM: PORTABLE ABDOMEN - 1 VIEW COMPARISON:  Portable exam 1118 hours compared to 10/06/2017 FINDINGS: Nasogastric tube coiled in stomach,  which is decompressed. Air-filled loops of nonspecific small bowel mid abdomen. No bowel wall thickening. Paucity of colonic gas. Multiple calcified  gallstones RIGHT upper quadrant. Bones demineralized. IMPRESSION: Cholelithiasis. Decompressed stomach versus prior exam. Nonspecific mildly dilated air-filled loops of small bowel in the mid abdomen with paucity of colonic gas, nonspecific, could reflect ileus or even potentially small-bowel obstruction; further imaging as clinically warranted. Electronically Signed   By: Lavonia Dana M.D.   On: 10/08/2017 11:29   I reviewed CXR myself, pulmonary edema noted  DISCUSSION: Chronically ill 67 year old male patient resides a nursing home.  Appears to be deconditioned at baseline.  Review of medical records indicate he is wheelchair-bound.  Has a history of ischemic cardiomyopathy, atrial fibrillation CKD stage III.  Not clear if he is infected or simply hypovolemic.  Chest x-ray is clear.  Will ensure adequate volume resuscitation, initiate bicarbonate drip.  A dialysis catheter is been placed in case he needs dialysis.  Currently getting blood, volume, bicarbonate infusion.  We will place him on empiric antibiotics as we await culture data to return nephrology is been consulted.  Discussed with TR[facility]-MD.  ASSESSMENT / PLAN:  Acute encephalopathy superimposed on chronic illness and underlying deconditioning.  -Unclear what his baseline cognitive status is.  May have some degree of underlying depression, also wonder about element of dementia   Plan D/C sedation Supportive care Correct his metabolic derangements via CRRT Continue to hold Wellbutrin  CARDS Circulatory shock.  Presume hypovolemic versus sepsis.  Does have underlying cardiomyopathy with EF 30 to 35%.  Also current metabolic acidosis likely contributing to decreased cardiac output -Favor hypovolemia and acidosis as likely contributing causes no clear source of infection except perhaps Urinary tract  ECHO similar tto baseline 10/07/17 - 35-40% EF  Plan Remove pressors off MAR Tele monitoring Transfer to SDU  Bradycardia with frequent  pause, history of atrial fibrillation 5/12- HR 73  Plan        Holding apixaban given thrombocytopenia Continue telemetry monitoring Continue to hold coreg for BP  RENAL Acute on chronic renal failure.  With stage III/IV chronic kidney disease.  Baseline serum creatinine around 1.6.  Presenting to the ER with creatinine 14.42;   Plan D/C CRRT HD per renal at this point BMET in AM Replace electrolytes as indicated Holding Allopurinol, Proscar, and Demadex  HEME Anemia has history of colon cancer.  Fecal occult blood positive today but was also positive a year ago.  Last hemoglobin actually 1 year ago per our records no obvious hemorrhage   Plan CBC in AM Transfuse per ICU protocol  Severe thrombocytopenia as well as borderline coagulopathy.  Marland Kitchen   DIC panel equivaocal but proably enterobacter UTI sepsis related. Marland Kitchen TTP - no indication given minimal jaundice, improing Cns features and no schistocytes  Plan Serial CBCs Transfuse for platelets less than 20 INR in AM Hold anti-coag for low platelet  INFECTIOIS DISEASE  A:  Enterobacter UTI P Narrow antibiotic to Merrem  FAMILY  - Updates:  No family bedside 5/14  - Inter-disciplinary family meet or Palliative Care meeting due by: 40  Transfer to SDU and to [facility] service with PCCM off 5/15.  Rush Farmer, M.D. [facility] Pulmonary/Critical Care Medicine. Pager: [phone]. After hours pager: [phone].  10/10/2017 10:18 AM

## 2017-10-11 ENCOUNTER — Other Ambulatory Visit: Payer: Self-pay

## 2017-10-11 DIAGNOSIS — N184 Chronic kidney disease, stage 4 (severe): Secondary | ICD-10-CM

## 2017-10-11 DIAGNOSIS — I255 Ischemic cardiomyopathy: Secondary | ICD-10-CM

## 2017-10-11 DIAGNOSIS — R571 Hypovolemic shock: Secondary | ICD-10-CM

## 2017-10-11 DIAGNOSIS — I480 Paroxysmal atrial fibrillation: Secondary | ICD-10-CM

## 2017-10-11 DIAGNOSIS — D696 Thrombocytopenia, unspecified: Secondary | ICD-10-CM

## 2017-10-11 LAB — CBC WITH DIFFERENTIAL/PLATELET
ABS IMMATURE GRANULOCYTES: 0 10*3/uL (ref 0.0–0.1)
BASOS ABS: 0 10*3/uL (ref 0.0–0.1)
BASOS PCT: 0 %
EOS ABS: 0.1 10*3/uL (ref 0.0–0.7)
Eosinophils Relative: 2 %
HCT: 24.2 % — ABNORMAL LOW (ref 39.0–52.0)
Hemoglobin: 7.7 g/dL — ABNORMAL LOW (ref 13.0–17.0)
IMMATURE GRANULOCYTES: 1 %
Lymphocytes Relative: 9 %
Lymphs Abs: 0.5 10*3/uL — ABNORMAL LOW (ref 0.7–4.0)
MCH: 27.4 pg (ref 26.0–34.0)
MCHC: 31.8 g/dL (ref 30.0–36.0)
MCV: 86.1 fL (ref 78.0–100.0)
MONOS PCT: 17 %
Monocytes Absolute: 0.9 10*3/uL (ref 0.1–1.0)
NEUTROS ABS: 3.7 10*3/uL (ref 1.7–7.7)
NEUTROS PCT: 71 %
PLATELETS: 44 10*3/uL — AB (ref 150–400)
RBC: 2.81 MIL/uL — ABNORMAL LOW (ref 4.22–5.81)
RDW: 20 % — AB (ref 11.5–15.5)
WBC: 5.2 10*3/uL (ref 4.0–10.5)

## 2017-10-11 LAB — RENAL FUNCTION PANEL
Albumin: 2.2 g/dL — ABNORMAL LOW (ref 3.5–5.0)
Anion gap: 11 (ref 5–15)
BUN: 29 mg/dL — ABNORMAL HIGH (ref 6–20)
CALCIUM: 8.8 mg/dL — AB (ref 8.9–10.3)
CO2: 26 mmol/L (ref 22–32)
CREATININE: 4.21 mg/dL — AB (ref 0.61–1.24)
Chloride: 95 mmol/L — ABNORMAL LOW (ref 101–111)
GFR calc Af Amer: 15 mL/min — ABNORMAL LOW (ref >60)
GFR, EST NON AFRICAN AMERICAN: 13 mL/min — AB (ref >60)
Glucose, Bld: 65 mg/dL (ref 65–99)
PHOSPHORUS: 4.3 mg/dL (ref 2.5–4.6)
Potassium: 4.3 mmol/L (ref 3.5–5.1)
Sodium: 132 mmol/L — ABNORMAL LOW (ref 135–145)

## 2017-10-11 LAB — FOLATE: FOLATE: 13.3 ng/mL (ref >5.9)

## 2017-10-11 LAB — CULTURE, BLOOD (ROUTINE X 2)
CULTURE: NO GROWTH
Culture: NO GROWTH
SPECIAL REQUESTS: ADEQUATE

## 2017-10-11 LAB — RETICULOCYTES
RBC.: 2.96 MIL/uL — ABNORMAL LOW (ref 4.22–5.81)
Retic Count, Absolute: 44.4 10*3/uL (ref 19.0–186.0)
Retic Ct Pct: 1.5 % (ref 0.4–3.1)

## 2017-10-11 LAB — IRON AND TIBC
IRON: 45 ug/dL (ref 45–182)
SATURATION RATIOS: 30 % (ref 17.9–39.5)
TIBC: 148 ug/dL — ABNORMAL LOW (ref 250–450)
UIBC: 103 ug/dL

## 2017-10-11 LAB — GLUCOSE, CAPILLARY
GLUCOSE-CAPILLARY: 98 mg/dL (ref 65–99)
Glucose-Capillary: 76 mg/dL (ref 65–99)
Glucose-Capillary: 69 mg/dL (ref 65–99)
Glucose-Capillary: 80 mg/dL (ref 65–99)
Glucose-Capillary: 112 mg/dL — ABNORMAL HIGH (ref 65–99)

## 2017-10-11 LAB — MAGNESIUM: Magnesium: 2.2 mg/dL (ref 1.7–2.4)

## 2017-10-11 LAB — FERRITIN: Ferritin: 686 ng/mL — ABNORMAL HIGH (ref 24–336)

## 2017-10-11 LAB — PREPARE RBC (CROSSMATCH)

## 2017-10-11 LAB — VITAMIN B12: Vitamin B-12: 1307 pg/mL — ABNORMAL HIGH (ref 180–914)

## 2017-10-11 MED ORDER — BACLOFEN 5 MG HALF TABLET
5.0000 mg | ORAL_TABLET | Freq: Three times a day (TID) | ORAL | Status: DC | PRN
  Administered 2017-10-14 – 2017-10-26 (×10): 5 mg via ORAL
  Filled 2017-10-11 (×10): qty 1

## 2017-10-11 MED ORDER — OXYCODONE-ACETAMINOPHEN 5-325 MG PO TABS
1.0000 | ORAL_TABLET | Freq: Three times a day (TID) | ORAL | Status: DC | PRN
  Administered 2017-10-11 – 2017-10-27 (×17): 1 via ORAL
  Filled 2017-10-11 (×15): qty 1

## 2017-10-11 MED ORDER — SODIUM CHLORIDE 0.9 % IV SOLN: Freq: Once | INTRAVENOUS | Status: DC

## 2017-10-11 MED ORDER — NEPRO/CARBSTEADY PO LIQD
237.0000 mL | Freq: Three times a day (TID) | ORAL | Status: DC
  Administered 2017-10-11 – 2017-10-27 (×30): 237 mL via ORAL
  Filled 2017-10-11 (×55): qty 237

## 2017-10-11 NOTE — Progress Notes (Signed)
Nutrition Follow-up  DOCUMENTATION CODES:   Not applicable  INTERVENTION:   -Nepro Shake po TID, each supplement provides 425 kcal and 19 grams protein  NUTRITION DIAGNOSIS:   Inadequate oral intake related to lethargy/confusion as evidenced by meal completion < 25%.  Ongoing  GOAL:   Patient will meet greater than or equal to 90% of their needs  Progressing  MONITOR:   PO intake, Supplement acceptance, Labs, Weight trends, Skin, I & O's  REASON FOR ASSESSMENT:   Ventilator    ASSESSMENT:   66 y/o male PMHx CAD, Afib, HF (EF 30-35%), remote colon cancer s/p colostomy, CKD3-4, WC bound, HTN/HLD. ALF resident. Presents after being found w/ AMS and abnormal renal labs. Worked up for AKI, acidosis, anemia. Intubated for airway protection. Started on CRRT.   5/12- trickle feeds initiated by MD 5/13- extubated, CRRT d/c 5/14- transferred from ICU to PCU   Reviewed I/O's: +755 ml x 24 hours and +6.7 L since admission.   Per nephrology notes, plan for HD today.   Pt sleeping soundly at time of visit. He did not arouse when name was called. Noted poor oral intake; meal completion 25%. Pt would benefit from addition of oral nutritional supplements. Per RN, pt is confused.   Reviewed records from [facility]; no pertinent PTA medications noted.   Labs reviewed: Na: 132, CBGS WDL.   Diet Order:   Diet Order           Diet Heart Room service appropriate? Yes; Fluid consistency: Thin  Diet effective now          EDUCATION NEEDS:   No education needs have been identified at this time  Skin:  Skin Assessment: Reviewed RN Assessment  Last BM:  10/11/17 (75 ml output via colostomy)  Height:   Ht Readings from Last 1 Encounters:  10/07/17 5\' 6"  (1.676 m)    Weight:   Wt Readings from Last 1 Encounters:  10/11/17 205 lb 7.5 oz (93.2 kg)    Ideal Body Weight:  70 kg  BMI:  Body mass index is 33.16 kg/m.  Estimated Nutritional Needs:   Kcal:   1900-2100  Protein:  100-115 grams  Fluid:  per MD    Phila Shoaf A. Jimmye Norman, RD, LDN, CDE Pager: [phone] After hours Pager: [phone]

## 2017-10-11 NOTE — Progress Notes (Signed)
Subjective:  Pt moved to [place]- hemodynamically stable but slightly confused - essentially no UOP   Objective Vital signs in last 24 hours: Vitals:   10/10/17 1940 10/10/17 2312 10/11/17 0304 10/11/17 0700  BP: 128/88 (!) 144/96 116/78 122/76  Pulse: 83 88 77 81  Resp: (!) 23 (!) 23 (!) 25 18  Temp: 97.8 F (36.6 C) 98.6 F (37 C) 98.3 F (36.8 C) 97.8 F (36.6 C)  TempSrc: Oral Oral Oral Oral  SpO2: 97% 97% 97% 97%  Weight:   93.2 kg (205 lb 7.5 oz)   Height:       Weight change: 8 kg (17 lb 10.2 oz)  Intake/Output Summary (Last 24 hours) at 10/11/2017 0753 Last data filed at 10/11/2017 0400 Gross per 24 hour  Intake 755 ml  Output 0 ml  Net 755 ml    Assessment/ Plan: Pt is Patrick [name] 67 y.o. yo male NHP, EF 35% baseline CKD- crt mid 1's who was admitted on 10/06/2017 with urosepsis and AKI  Assessment/Plan: 1. Renal- Patrick [name] on CKD- thought due to ATN/septic shock- req initiation of CRRT on 5/10- stopped on 5/13.  Still no UOP - plan on IHD today.  Vascath placed 5/10 2. Urosepsis- enterobacter- meropenam  3. Anemia- on ESA- no iron due to infection- supportive care, transfuse as needed, hgb 7.7 4. Elytes- K , phos and Ca OK 5. HTN/volume- has pitting edema but also likely third spacing- not making urine so any removal will need to be via UF- on room air- not responsive to  Lasix in past- since essentially no UOP will take out foley  Patrick Patrick [name]    Labs: Basic Metabolic Panel: Recent Labs  Lab 10/09/17 1545 10/10/17 0339 10/11/17 0430  NA 134* 135 132*  K 3.9 4.1 4.3  CL 98* 96* 95*  CO2 30 29 26   GLUCOSE 81 82 65  BUN 20 23* 29*  CREATININE 2.64* 3.29* 4.21*  CALCIUM 7.9* 8.2* 8.8*  PHOS 2.8 3.4 4.3   Liver Function Tests: Recent Labs  Lab 10/06/17 1123 10/06/17 2007  10/09/17 1545 10/10/17 0339 10/11/17 0430  AST 9* 11*  9*  --   --   --   --   ALT 12* 11*  12*  --   --   --   --   ALKPHOS 100 94  94  --   --   --   --   BILITOT 1.5* 1.8*  1.6*  --    --   --   --   PROT 6.9 6.6  6.5  --   --   --   --   ALBUMIN 2.6* 2.5*  2.3*   < > 2.2* 2.2* 2.2*   < > = values in this interval not displayed.   No results for input(s): LIPASE, AMYLASE in the last 168 hours. No results for input(s): AMMONIA in the last 168 hours. CBC: Recent Labs  Lab 10/07/17 2042 10/08/17 0409 10/09/17 0350 10/10/17 0339 10/11/17 0430  WBC 4.2 4.4 4.3 4.4 5.2  NEUTROABS  --  3.3 3.0 3.1 3.7  HGB 7.8* 8.2* 8.0* 7.7* 7.7*  HCT 23.5* 24.4* 24.9* 24.2* 24.2*  MCV 79.4 80.5 84.1 86.1 86.1  PLT 41* 39* 32* 33* 44*   Cardiac Enzymes: Recent Labs  Lab 10/06/17 2007 10/07/17 0307  TROPONINI 0.33* 0.55*   CBG: Recent Labs  Lab 10/10/17 1127 10/10/17 1603 10/10/17 1938 10/10/17 2311 10/11/17 0303  GLUCAP 68 71 98 73 76  Iron Studies: No results for input(s): IRON, TIBC, TRANSFERRIN, FERRITIN in the last 72 hours. Studies/Results: No results found. Medications: Infusions: . sodium chloride Stopped (10/09/17 2117)  . meropenem (MERREM) IV Stopped (10/10/17 2307)    Scheduled Medications: . Chlorhexidine Gluconate Cloth  6 each Topical Daily  . darbepoetin (ARANESP) injection - NON-DIALYSIS  200 mcg Subcutaneous Q Mon-1800  . mouth rinse  15 mL Mouth Rinse BID  . mupirocin ointment  1 application Nasal BID  . pantoprazole  40 mg Oral Daily  . sodium chloride flush  10-40 mL Intracatheter Q12H    have reviewed scheduled and prn medications.  Physical Exam: General: alert, confused Heart: RRR Lungs: mostly clear Abdomen: soft, non tender Extremities: pitting dep edema Dialysis Access: left IJ vascath placed 5/10    10/11/2017,7:53 AM  LOS: 5 days

## 2017-10-11 NOTE — Progress Notes (Signed)
eLink Physician-Brief Progress Note Patient Name: Patrick [name] DOB: 1950-11-28 MRN: [id number]   Date of Service  10/11/2017  HPI/Events of Note  Patient c/o of leg pain and take Percocet at home. AST and ALT are both normal.   eICU Interventions  Will order: 1. Percocet 5 mg/325 mg PO Q 8 hours PRN severe pain.      Intervention Category Intermediate Interventions: Pain - evaluation and management  Sommer,Steven Eugene 10/11/2017, 2:05 AM

## 2017-10-11 NOTE — Consult Note (Signed)
   [facility] CM Inpatient Consult   10/11/2017  Patrick [name] Oct 05, 1950 [id number]   Patient screened for high risk for unplanned readmission score [place] [facility]. [employer].  Chart reviewed and patient is a long term resident at  [facility] prior to admission. Admitted with Acute hypoxic respiratory failure and was intubated. Anticipate the patient's disposition will be to return to a skilled facility.  For questions contact:   Natividad Brood, RN BSN [facility] Liaison  [phone] business mobile phone Toll free office [phone]

## 2017-10-11 NOTE — Progress Notes (Signed)
PROGRESS NOTE    Patrick [name]  QBV:[id number] DOB: [birth year] DOA: 10/06/2017 PCP: Patient, [name]   Brief Narrative:  67 yo WM  PMHx CAD, Chronic Systolic CHF (ischemic cardiomyopathy  EF 30-35%), remote Colon Cancer w/ perm end colostomy back in 2018/may.  CKD Stage III-IV, SBO, Enlarged prostate,. Resides at SNF (not clear when or why this started). He is wheelchair bound.   Presents to ED 5/10 after being found at SNF w/ altered LOC and several lab abnormalities including: cr 13.35,  K 5.7, and + AG metabolic acidosis w/; nml LA.  hgb 7.5 hypoglycemia and hypotension w/ SBP in 70s.  ER course: IVFs, 1 unit PRBCs, cultures obtained and empiric abx Remained hypotensive w/ best BP in 90s. Not able to pass CVL. PCCM asked to see for critical care services as well as to obtain access for anticipated need for urgent HD.     Subjective: 5/15 A/O x4 abdominal pain.  States cold rectal cancer 2018, resection negative chemo negative XRT seen [facility] at [facility] : Does not recall the name of his oncologist.      Assessment & Plan:   Active Problems:   Encounter for central line placement   Shock circulatory ([place])   Compromised airway   Acute respiratory failure with hypoxemia (HCC)  Acute encephalopathy, superimposed on chronic illness and underlying deconditioning - Resolved patient A/O x4 today. - Hold Wellbutrin -Be extremely careful with sedating medication.  Ischemic Cardiomyopathy -EF 30 to 35% - Strict in and out -Daily weight - Transfuse for Hemoglobin<8 -5/15 transfuse 1 unit PRBC   CARDS Circulatory shock.  Presume hypovolemic versus sepsis.  Does have underlying cardiomyopathy with EF 30 to 35%.  Also current metabolic acidosis likely contributing to decreased cardiac output -Favor hypovolemia and acidosis as likely contributing causes no clear source of infection except perhaps Urinary tract  ECHO similar tto baseline 10/07/17 - 35-40% EF   Plan Remove pressors  off MAR Tele monitoring Transfer to SDU   Paroxysmal atrial fibrillation/bradycardia with frequent pauses     Holding apixaban given thrombocytopenia Continue telemetry monitoring Continue to hold coreg for BP   Acute on CKD stage III/IV (baseline Cr 1.6) - On admission Cr= 14.42 - Patient on round of CRRT - Now on HD per nephrology -Hold Allopurinol, Proscar, and Demadex  Enterobacter UTI - Complete course of meropenem   Anemia (Hemoglobin December 2018= 11.4) - Per PCCM note  Fecal Occult (positive): Chronic with Hemoccult-positive 1 year ago -Anemia panel pending - See ischemic cardiomyopathy    Acute on chronic thrombocytopenia (platelets December 2018; 111-123) -Most likely secondary to Enterobacter UTI -TTP unlikely given negative schistocytes and improving creatinine, negative jaundice -DIC panel equivocal -Transfuse for platelets<20  Leg Cramps -Baclofen TID PRN + PRN Heat pads      DVT prophylaxis: Held secondary to thrombocytopenia Code Status: Partial Family Communication: None Disposition Plan: TBD   Consultants:  PCCM Nephrology    Procedures/Significant Events:  5/10 transfuse 1 unit PRBC Echocardiogram 5/10 Blood cultures 5/10 Urine culture 5/10 - > 100 K enterobacter Vancomycin 5/10 Zosyn 5/10 Left IJ dialysis catheter 5/10    I have personally reviewed and interpreted all radiology studies and my findings are as above.  VENTILATOR SETTINGS:    Cultures      Antimicrobials: Anti-infectives (From admission, onward)   Start     Stop   10/10/17 2200  meropenem (MERREM) 500 mg in sodium chloride 0.9 % 100 mL IVPB  10/14/17 2359   10/08/17 1200  meropenem (MERREM) 1 g in sodium chloride 0.9 % 100 mL IVPB  Status:  Discontinued     10/10/17 1022   10/07/17 1600  vancomycin (VANCOCIN) IVPB 1000 mg/200 mL premix  Status:  Discontinued     10/08/17 1050   10/07/17 0400  piperacillin-tazobactam (ZOSYN) IVPB 3.375 g  Status:   Discontinued     10/08/17 1050   10/06/17 2000  piperacillin-tazobactam (ZOSYN) IVPB 3.375 g  Status:  Discontinued     10/06/17 2313   10/06/17 1615  vancomycin (VANCOCIN) 2,000 mg in sodium chloride 0.9 % 500 mL IVPB     10/06/17 1815   10/06/17 1600  piperacillin-tazobactam (ZOSYN) IVPB 3.375 g     10/06/17 1706   10/06/17 1130  cefTRIAXone (ROCEPHIN) 1 g in sodium chloride 0.9 % 100 mL IVPB  Status:  Discontinued     10/06/17 1516   10/06/17 0400  piperacillin-tazobactam (ZOSYN) IVPB 3.375 g  Status:  Discontinued     10/06/17 1955       Devices   LINES / TUBES:  LIJ 5/10 -5/15 Right Femoral CVL 5/10-5/15      Continuous Infusions: . sodium chloride Stopped (10/09/17 2117)  . meropenem (MERREM) IV Stopped (10/10/17 2307)     Objective: Vitals:   10/10/17 1940 10/10/17 2312 10/11/17 0304 10/11/17 0700  BP: 128/88 (!) 144/96 116/78 122/76  Pulse: 83 88 77 81  Resp: (!) 23 (!) 23 (!) 25 18  Temp: 97.8 F (36.6 C) 98.6 F (37 C) 98.3 F (36.8 C) 97.8 F (36.6 C)  TempSrc: Oral Oral Oral Oral  SpO2: 97% 97% 97% 97%  Weight:   205 lb 7.5 oz (93.2 kg)   Height:        Intake/Output Summary (Last 24 hours) at 10/11/2017 0748 Last data filed at 10/11/2017 0400 Gross per 24 hour  Intake 755 ml  Output 0 ml  Net 755 ml   Filed Weights   10/09/17 0400 10/10/17 0340 10/11/17 0304  Weight: 189 lb 2.5 oz (85.8 kg) 187 lb 13.3 oz (85.2 kg) 205 lb 7.5 oz (93.2 kg)    Examination:  General: A/O x4, No acute respiratory distress Neck:  Negative scars, masses, torticollis, lymphadenopathy, JVD Lungs: Clear to auscultation bilaterally without wheezes or crackles Cardiovascular: Regular rate and rhythm without murmur gallop or rub normal S1 and S2 Abdomen: negative abdominal pain, nondistended, positive soft, bowel sounds, no rebound, no ascites, no appreciable mass,Ostemy bag LLQ  Extremities: No significant cyanosis, clubbing, or edema bilateral lower  extremities Skin: Multiple bruises hands and legs(present on admission)  Psychiatric:  Negative depression, negative anxiety, negative fatigue, negative mania  Central nervous system:  Cranial nerves II through XII intact, tongue/uvula midline, all extremities muscle strength 5/5, sensation intact throughout, negative dysarthria, negative expressive aphasia, negative receptive aphasia.  .     Data Reviewed: Care during the described time interval was provided by me .  I have reviewed this patient's available data, including medical history, events of note, physical examination, and all test results as part of my evaluation.   CBC: Recent Labs  Lab 10/06/17 1123  10/07/17 2042 10/08/17 0409 10/09/17 0350 10/10/17 0339 10/11/17 0430  WBC 4.2   < > 4.2 4.4 4.3 4.4 5.2  NEUTROABS 3.7  --   --  3.3 3.0 3.1 3.7  HGB 7.7*   < > 7.8* 8.2* 8.0* 7.7* 7.7*  HCT 23.4*   < > 23.5* 24.4*  24.9* 24.2* 24.2*  MCV 82.4   < > 79.4 80.5 84.1 86.1 86.1  PLT 32*   < > 41* 39* 32* 33* 44*   < > = values in this interval not displayed.   Basic Metabolic Panel: Recent Labs  Lab 10/07/17 0307  10/08/17 0409 10/08/17 1552 10/08/17 2355 10/09/17 0350 10/09/17 1545 10/10/17 0339 10/11/17 0430  NA  --    < > 138 136 134* 136 134* 135 132*  K  --    < > 3.3* 3.8 3.9 4.0 3.9 4.1 4.3  CL  --    < > 87* 95* 95* 98* 98* 96* 95*  CO2  --    < > 36* 32 31 31 30 29 26   GLUCOSE  --    < > 75 99 100* 93 81 82 65  BUN  --    < > 47* 33* 26* 25* 20 23* 29*  CREATININE  --    < > 5.25* 3.96* 3.38* 3.14* 2.64* 3.29* 4.21*  CALCIUM  --    < > 6.6* 7.1* 7.2* 7.4* 7.9* 8.2* 8.8*  MG 1.5*  --  1.9  --   --  2.1  --  2.1 2.2  PHOS 8.0*   < > 2.8  2.8 2.3*  --  2.4* 2.8 3.4 4.3   < > = values in this interval not displayed.   GFR: Estimated Creatinine Clearance: 17.7 mL/min (A) (by C-G formula based on SCr of 4.21 mg/dL (H)). Liver Function Tests: Recent Labs  Lab 10/06/17 1123 10/06/17 2007  10/08/17 1552  10/09/17 0350 10/09/17 1545 10/10/17 0339 10/11/17 0430  AST 9* 11*  9*  --   --   --   --   --   --   ALT 12* 11*  12*  --   --   --   --   --   --   ALKPHOS 100 94  94  --   --   --   --   --   --   BILITOT 1.5* 1.8*  1.6*  --   --   --   --   --   --   PROT 6.9 6.6  6.5  --   --   --   --   --   --   ALBUMIN 2.6* 2.5*  2.3*   < > 2.0* 2.2* 2.2* 2.2* 2.2*   < > = values in this interval not displayed.   No results for input(s): LIPASE, AMYLASE in the last 168 hours. No results for input(s): AMMONIA in the last 168 hours. Coagulation Profile: Recent Labs  Lab 10/06/17 1123 10/06/17 2007  INR 2.28 2.17   Cardiac Enzymes: Recent Labs  Lab 10/06/17 2007 10/07/17 0307  TROPONINI 0.33* 0.55*   BNP (last 3 results) No results for input(s): PROBNP in the last 8760 hours. HbA1C: No results for input(s): HGBA1C in the last 72 hours. CBG: Recent Labs  Lab 10/10/17 1127 10/10/17 1603 10/10/17 1938 10/10/17 2311 10/11/17 0303  GLUCAP 68 71 98 73 76   Lipid Profile: No results for input(s): CHOL, HDL, LDLCALC, TRIG, CHOLHDL, LDLDIRECT in the last 72 hours. Thyroid Function Tests: No results for input(s): TSH, T4TOTAL, FREET4, T3FREE, THYROIDAB in the last 72 hours. Anemia Panel: No results for input(s): VITAMINB12, FOLATE, FERRITIN, TIBC, IRON, RETICCTPCT in the last 72 hours. Urine analysis:    Component Value Date/Time   COLORURINE RED (A) 10/06/2017 1101  APPEARANCEUR TURBID (A) 10/06/2017 1101   LABSPEC  10/06/2017 1101    TEST NOT REPORTED DUE TO COLOR INTERFERENCE OF URINE PIGMENT   PHURINE  10/06/2017 1101    TEST NOT REPORTED DUE TO COLOR INTERFERENCE OF URINE PIGMENT   GLUCOSEU (A) 10/06/2017 1101    TEST NOT REPORTED DUE TO COLOR INTERFERENCE OF URINE PIGMENT   HGBUR (A) 10/06/2017 1101    TEST NOT REPORTED DUE TO COLOR INTERFERENCE OF URINE PIGMENT   HGBUR negative 12/26/2008 1541   BILIRUBINUR (A) 10/06/2017 1101    TEST NOT REPORTED DUE TO COLOR  INTERFERENCE OF URINE PIGMENT   KETONESUR (A) 10/06/2017 1101    TEST NOT REPORTED DUE TO COLOR INTERFERENCE OF URINE PIGMENT   PROTEINUR (A) 10/06/2017 1101    TEST NOT REPORTED DUE TO COLOR INTERFERENCE OF URINE PIGMENT   UROBILINOGEN 1.0 04/03/2015 1725   NITRITE (A) 10/06/2017 1101    TEST NOT REPORTED DUE TO COLOR INTERFERENCE OF URINE PIGMENT   LEUKOCYTESUR (A) 10/06/2017 1101    TEST NOT REPORTED DUE TO COLOR INTERFERENCE OF URINE PIGMENT   Sepsis Labs: @LABRCNTIP (procalcitonin:4,lacticidven:4)  ) Recent Results (from the past 240 hour(s))  Urine culture     Status: Abnormal   Collection Time: 10/06/17 11:39 AM  Result Value Ref Range Status   Specimen Description URINE, CATHETERIZED  Final   Special Requests   Final    NONE Performed at [facility], [place] [address] [place], [place] [address]    Culture >=100,000 COLONIES/mL ENTEROBACTER SPECIES (A)  Final   Report Status 10/08/2017 FINAL  Final   Organism ID, Bacteria ENTEROBACTER SPECIES (A)  Final      Susceptibility   Enterobacter species - MIC*    CEFAZOLIN >=64 RESISTANT Resistant     CEFTRIAXONE >=64 RESISTANT Resistant     CIPROFLOXACIN <=0.25 SENSITIVE Sensitive     GENTAMICIN <=1 SENSITIVE Sensitive     IMIPENEM 0.5 SENSITIVE Sensitive     NITROFURANTOIN 32 SENSITIVE Sensitive     TRIMETH/SULFA <=20 SENSITIVE Sensitive     PIP/TAZO >=128 RESISTANT Resistant     * >=100,000 COLONIES/mL ENTEROBACTER SPECIES  Culture, blood (Routine x 2)     Status: None (Preliminary result)   Collection Time: 10/06/17 11:40 AM  Result Value Ref Range Status   Specimen Description BLOOD RIGHT UPPER ARM  Final   Special Requests   Final    BOTTLES DRAWN AEROBIC AND ANAEROBIC Blood Culture adequate volume   Culture   Final    NO GROWTH 4 DAYS Performed at [facility], [address] [place], [place] [address]    Report Status PENDING  Incomplete  Culture, blood (Routine x 2)     Status: None (Preliminary  result)   Collection Time: 10/06/17 11:50 AM  Result Value Ref Range Status   Specimen Description BLOOD LEFT ANTECUBITAL  Final   Special Requests   Final    BOTTLES DRAWN AEROBIC AND ANAEROBIC Blood Culture results may not be optimal due to an excessive volume of blood received in culture bottles   Culture   Final    NO GROWTH 4 DAYS Performed at [facility], [place] [address], [place], [place] [address]    Report Status PENDING  Incomplete  MRSA PCR Screening     Status: Abnormal   Collection Time: 10/09/17 12:20 PM  Result Value Ref Range Status   MRSA by PCR POSITIVE (A) NEGATIVE Final    Comment:  The GeneXpert MRSA Assay (FDA approved for NASAL specimens only), is one component of a comprehensive MRSA colonization surveillance program. It is not intended to diagnose MRSA infection nor to guide or monitor treatment for MRSA infections. RESULT CALLED TO, READ BACK BY AND VERIFIED WITH: RN Leron Croak [id number] MLM Performed at [facility] [address], [place], [place] [address]          Radiology Studies: No results found.      Scheduled Meds: . Chlorhexidine Gluconate Cloth  6 each Topical Daily  . darbepoetin (ARANESP) injection - NON-DIALYSIS  200 mcg Subcutaneous Q Mon-1800  . mouth rinse  15 mL Mouth Rinse BID  . mupirocin ointment  1 application Nasal BID  . pantoprazole  40 mg Oral Daily  . sodium chloride flush  10-40 mL Intracatheter Q12H   Continuous Infusions: . sodium chloride Stopped (10/09/17 2117)  . meropenem (MERREM) IV Stopped (10/10/17 2307)     LOS: 5 days    Time spent: 40 minutes    WOODS, Geraldo Docker, MD [facility] Pager [phone]   If 7PM-7AM, please contact night-coverage [email or web address] Password [facility] 10/11/2017, 7:48 AM

## 2017-10-12 LAB — GLUCOSE, CAPILLARY
GLUCOSE-CAPILLARY: 81 mg/dL (ref 65–99)
GLUCOSE-CAPILLARY: 86 mg/dL (ref 65–99)
Glucose-Capillary: 104 mg/dL — ABNORMAL HIGH (ref 65–99)
Glucose-Capillary: 112 mg/dL — ABNORMAL HIGH (ref 65–99)
Glucose-Capillary: 84 mg/dL (ref 65–99)
Glucose-Capillary: 97 mg/dL (ref 65–99)

## 2017-10-12 LAB — RENAL FUNCTION PANEL
ALBUMIN: 2.2 g/dL — AB (ref 3.5–5.0)
Anion gap: 11 (ref 5–15)
BUN: 37 mg/dL — AB (ref 6–20)
CALCIUM: 8.4 mg/dL — AB (ref 8.9–10.3)
CO2: 26 mmol/L (ref 22–32)
CREATININE: 5.54 mg/dL — AB (ref 0.61–1.24)
Chloride: 94 mmol/L — ABNORMAL LOW (ref 101–111)
GFR, EST AFRICAN AMERICAN: 11 mL/min — AB (ref >60)
GFR, EST NON AFRICAN AMERICAN: 9 mL/min — AB (ref >60)
Glucose, Bld: 108 mg/dL — ABNORMAL HIGH (ref 65–99)
PHOSPHORUS: 4.5 mg/dL (ref 2.5–4.6)
Potassium: 3.9 mmol/L (ref 3.5–5.1)
Sodium: 131 mmol/L — ABNORMAL LOW (ref 135–145)

## 2017-10-12 LAB — PREPARE RBC (CROSSMATCH)

## 2017-10-12 LAB — CBC
HCT: 23.4 % — ABNORMAL LOW (ref 39.0–52.0)
Hemoglobin: 7.3 g/dL — ABNORMAL LOW (ref 13.0–17.0)
MCH: 26.5 pg (ref 26.0–34.0)
MCHC: 31.2 g/dL (ref 30.0–36.0)
MCV: 85.1 fL (ref 78.0–100.0)
PLATELETS: 58 10*3/uL — AB (ref 150–400)
RBC: 2.75 MIL/uL — AB (ref 4.22–5.81)
RDW: 20 % — ABNORMAL HIGH (ref 11.5–15.5)
WBC: 2.1 10*3/uL — AB (ref 4.0–10.5)

## 2017-10-12 LAB — MAGNESIUM: Magnesium: 2.1 mg/dL (ref 1.7–2.4)

## 2017-10-12 LAB — HEPATITIS B SURFACE ANTIGEN: Hepatitis B Surface Ag: NEGATIVE

## 2017-10-12 MED ORDER — OXYCODONE-ACETAMINOPHEN 5-325 MG PO TABS
ORAL_TABLET | ORAL | Status: AC
  Filled 2017-10-12: qty 1

## 2017-10-12 MED ORDER — SODIUM CHLORIDE 0.9 % IV SOLN: Freq: Once | INTRAVENOUS | Status: DC

## 2017-10-12 NOTE — Progress Notes (Signed)
PROGRESS NOTE    Patrick [name]  CWC:[id number] DOB: 01-Sep-1950 DOA: 10/06/2017 PCP: Patient, [name]   Brief Narrative:  67 yo WM  PMHx CAD, Chronic Systolic CHF (ischemic cardiomyopathy  EF 30-35%), remote Colon Cancer w/ perm end colostomy back in 2018/may.  CKD Stage III-IV, SBO, Enlarged prostate,. Resides at SNF (not clear when or why this started). He is wheelchair bound.   Presents to ED 5/10 after being found at SNF w/ altered LOC and several lab abnormalities including: cr 13.35,  K 5.7, and + AG metabolic acidosis w/; nml LA.  hgb 7.5 hypoglycemia and hypotension w/ SBP in 70s.  ER course: IVFs, 1 unit PRBCs, cultures obtained and empiric abx Remained hypotensive w/ best BP in 90s. Not able to pass CVL. PCCM asked to see for critical care services as well as to obtain access for anticipated need for urgent HD.     Subjective: 5/16 A/O x4, positive fatigue post HD.  Negative CP positive S OB (was sleeping upon entering the room SPO2= 85% on RA).  Negative abdominal pain.    Assessment & Plan:   Active Problems:   Encounter for central line placement   Shock circulatory ([place])   Compromised airway   Acute respiratory failure with hypoxemia (HCC)  Acute encephalopathy, superimposed on chronic illness and underlying deconditioning - Resolved patient A/O x4 today. - Continue to hold Wellbutrin and would also be judicious with sedating medication.   Chronic Systolic CHF/ ischemic Cardiomyopathy -EF 30 to 35% - Strict in and out since admission +6.1 L -Daily weight Filed Weights   10/12/17 0359 10/12/17 0705 10/12/17 1107  Weight: 204 lb 2.3 oz (92.6 kg) 195 lb 15.8 oz (88.9 kg) 192 lb 10.9 oz (87.4 kg)  - Transfuse for Hemoglobin<8 -5/15 transfuse 1 unit PRBC  Hypovolemic shock -Resolved off pressors   Paroxysmal atrial fibrillation/bradycardia with frequent pauses  -  Continue to hold apixaban given thrombocytopenia  Acute on CKD stage III/IV (baseline Cr 1.6) -  On admission Cr= 14.42 - Patient on round of CRRT - Now on HD per nephrology -Hold Allopurinol, Proscar, and Demadex  Enterobacter UTI - Complete course of meropenem   Anemia of Chronic Disease (Hemoglobin December 2018= 11.4) - Per PCCM note  Fecal Occult (positive): Chronic with Hemoccult-positive 1 year ago -Anemia panel most consistent with Anemia of Chronic Disease - See ischemic cardiomyopathy   Acute on chronic thrombocytopenia (platelets December 2018; 111-123) -Most likely secondary to Enterobacter UTI -TTP unlikely given negative schistocytes and improving creatinine, negative jaundice -DIC panel equivocal -Transfuse for platelets<20 -Platelets slightly improved today  Leg Cramps -Baclofen TID PRN + PRN Heat pads      DVT prophylaxis: Apixaban held secondary to thrombocytopenia: SCD Code Status: Partial Family Communication: None Disposition Plan: TBD    Consultants:  PCCM Nephrology    Procedures/Significant Events:  5/10 transfuse 1 unit PRBC 5/11 Echocardiogram ;- Left ventricle: Diffuse hypokineis worse in inferior wall with abnromal septal motion -LVEF = 35% to 40%. - Mitral valve: There was mild regurgitation. - Left atrium:  severely dilated.-- Right ventricle:  moderately dilated. - Right atrium:  moderately dilated. - Tricuspid valve: moderate regurgitation.       I have personally reviewed and interpreted all radiology studies and my findings are as above.  VENTILATOR SETTINGS:    Cultures 5/10 blood cultures  5/10 urine culture 5/10 Positive >100 K enterobacter       Antimicrobials: Anti-infectives (From admission, onward)  Start     Stop   10/10/17 2200  meropenem (MERREM) 500 mg in sodium chloride 0.9 % 100 mL IVPB     10/14/17 2359   10/08/17 1200  meropenem (MERREM) 1 g in sodium chloride 0.9 % 100 mL IVPB  Status:  Discontinued     10/10/17 1022   10/07/17 1600  vancomycin (VANCOCIN) IVPB 1000 mg/200 mL premix   Status:  Discontinued     10/08/17 1050   10/07/17 0400  piperacillin-tazobactam (ZOSYN) IVPB 3.375 g  Status:  Discontinued     10/08/17 1050   10/06/17 2000  piperacillin-tazobactam (ZOSYN) IVPB 3.375 g  Status:  Discontinued     10/06/17 2313   10/06/17 1615  vancomycin (VANCOCIN) 2,000 mg in sodium chloride 0.9 % 500 mL IVPB     10/06/17 1815   10/06/17 1600  piperacillin-tazobactam (ZOSYN) IVPB 3.375 g     10/06/17 1706   10/06/17 1130  cefTRIAXone (ROCEPHIN) 1 g in sodium chloride 0.9 % 100 mL IVPB  Status:  Discontinued     10/06/17 1516   10/06/17 0400  piperacillin-tazobactam (ZOSYN) IVPB 3.375 g  Status:  Discontinued     10/06/17 1955       Devices   LINES / TUBES:  Right Femoral CVL 5/10-5/15 Left IJ dialysis catheter 5/10>>>    Continuous Infusions: . sodium chloride Stopped (10/09/17 2117)  . sodium chloride    . meropenem (MERREM) IV Stopped (10/11/17 2319)     Objective: Vitals:   10/11/17 1906 10/12/17 0016 10/12/17 0357 10/12/17 0359  BP: 131/85 119/83 139/87   Pulse: 86 76 82   Resp: 16 (!) 26 (!) 28   Temp: 97.6 F (36.4 C) 97.8 F (36.6 C) 97.9 F (36.6 C)   TempSrc: Oral Oral Oral   SpO2: 96% 99% 99%   Weight:    204 lb 2.3 oz (92.6 kg)  Height:        Intake/Output Summary (Last 24 hours) at 10/12/2017 0727 Last data filed at 10/12/2017 0000 Gross per 24 hour  Intake 367 ml  Output 100 ml  Net 267 ml   Filed Weights   10/10/17 0340 10/11/17 0304 10/12/17 0359  Weight: 187 lb 13.3 oz (85.2 kg) 205 lb 7.5 oz (93.2 kg) 204 lb 2.3 oz (92.6 kg)   Physical Exam:  General: A/O x4, No acute respiratory distress Neck:  Negative scars, masses, torticollis, lymphadenopathy, JVD Lungs: Clear to auscultation bilaterally without wheezes or crackles Cardiovascular: Regular rate and rhythm without murmur gallop or rub normal S1 and S2 Abdomen: negative abdominal pain, nondistended, positive soft, bowel sounds, no rebound, no ascites, no  appreciable mass, ostomy bag LLQ Extremities: No significant cyanosis, clubbing, or edema bilateral lower extremities Skin: Multiple bruises hands and legs (present on admission)  Psychiatric:  Negative depression, negative anxiety, negative fatigue, negative mania  Central nervous system:  Cranial nerves II through XII intact, tongue/uvula midline, all extremities muscle strength 5/5, sensation intact throughout,  negative dysarthria, negative expressive aphasia, negative receptive aphasia.   .     Data Reviewed: Care during the described time interval was provided by me .  I have reviewed this patient's available data, including medical history, events of note, physical examination, and all test results as part of my evaluation.   CBC: Recent Labs  Lab 10/06/17 1123  10/07/17 2042 10/08/17 0409 10/09/17 0350 10/10/17 0339 10/11/17 0430  WBC 4.2   < > 4.2 4.4 4.3 4.4 5.2  NEUTROABS 3.7  --   --  3.3 3.0 3.1 3.7  HGB 7.7*   < > 7.8* 8.2* 8.0* 7.7* 7.7*  HCT 23.4*   < > 23.5* 24.4* 24.9* 24.2* 24.2*  MCV 82.4   < > 79.4 80.5 84.1 86.1 86.1  PLT 32*   < > 41* 39* 32* 33* 44*   < > = values in this interval not displayed.   Basic Metabolic Panel: Recent Labs  Lab 10/08/17 0409  10/09/17 0350 10/09/17 1545 10/10/17 0339 10/11/17 0430 10/12/17 0400  NA 138   < > 136 134* 135 132* 131*  K 3.3*   < > 4.0 3.9 4.1 4.3 3.9  CL 87*   < > 98* 98* 96* 95* 94*  CO2 36*   < > 31 30 29 26 26   GLUCOSE 75   < > 93 81 82 65 108*  BUN 47*   < > 25* 20 23* 29* 37*  CREATININE 5.25*   < > 3.14* 2.64* 3.29* 4.21* 5.54*  CALCIUM 6.6*   < > 7.4* 7.9* 8.2* 8.8* 8.4*  MG 1.9  --  2.1  --  2.1 2.2 2.1  PHOS 2.8  2.8   < > 2.4* 2.8 3.4 4.3 4.5   < > = values in this interval not displayed.   GFR: Estimated Creatinine Clearance: 13.4 mL/min (A) (by C-G formula based on SCr of 5.54 mg/dL (H)). Liver Function Tests: Recent Labs  Lab 10/06/17 1123 10/06/17 2007  10/09/17 0350 10/09/17 1545  10/10/17 0339 10/11/17 0430 10/12/17 0400  AST 9* 11*  9*  --   --   --   --   --   --   ALT 12* 11*  12*  --   --   --   --   --   --   ALKPHOS 100 94  94  --   --   --   --   --   --   BILITOT 1.5* 1.8*  1.6*  --   --   --   --   --   --   PROT 6.9 6.6  6.5  --   --   --   --   --   --   ALBUMIN 2.6* 2.5*  2.3*   < > 2.2* 2.2* 2.2* 2.2* 2.2*   < > = values in this interval not displayed.   No results for input(s): LIPASE, AMYLASE in the last 168 hours. No results for input(s): AMMONIA in the last 168 hours. Coagulation Profile: Recent Labs  Lab 10/06/17 1123 10/06/17 2007  INR 2.28 2.17   Cardiac Enzymes: Recent Labs  Lab 10/06/17 2007 10/07/17 0307  TROPONINI 0.33* 0.55*   BNP (last 3 results) No results for input(s): PROBNP in the last 8760 hours. HbA1C: No results for input(s): HGBA1C in the last 72 hours. CBG: Recent Labs  Lab 10/11/17 1202 10/11/17 1552 10/11/17 1905 10/12/17 0009 10/12/17 0452  GLUCAP 80 112* 98 86 84   Lipid Profile: No results for input(s): CHOL, HDL, LDLCALC, TRIG, CHOLHDL, LDLDIRECT in the last 72 hours. Thyroid Function Tests: No results for input(s): TSH, T4TOTAL, FREET4, T3FREE, THYROIDAB in the last 72 hours. Anemia Panel: Recent Labs    10/11/17 0920  VITAMINB12 1,307*  FOLATE 13.3  FERRITIN 686*  TIBC 148*  IRON 45  RETICCTPCT 1.5   Urine analysis:    Component Value Date/Time   COLORURINE RED (A) 10/06/2017 1101   APPEARANCEUR TURBID (A) 10/06/2017 1101   LABSPEC  10/06/2017 1101    TEST NOT REPORTED DUE TO COLOR INTERFERENCE OF URINE PIGMENT   PHURINE  10/06/2017 1101    TEST NOT REPORTED DUE TO COLOR INTERFERENCE OF URINE PIGMENT   GLUCOSEU (A) 10/06/2017 1101    TEST NOT REPORTED DUE TO COLOR INTERFERENCE OF URINE PIGMENT   HGBUR (A) 10/06/2017 1101    TEST NOT REPORTED DUE TO COLOR INTERFERENCE OF URINE PIGMENT   HGBUR negative 12/26/2008 1541   BILIRUBINUR (A) 10/06/2017 1101    TEST NOT REPORTED  DUE TO COLOR INTERFERENCE OF URINE PIGMENT   KETONESUR (A) 10/06/2017 1101    TEST NOT REPORTED DUE TO COLOR INTERFERENCE OF URINE PIGMENT   PROTEINUR (A) 10/06/2017 1101    TEST NOT REPORTED DUE TO COLOR INTERFERENCE OF URINE PIGMENT   UROBILINOGEN 1.0 04/03/2015 1725   NITRITE (A) 10/06/2017 1101    TEST NOT REPORTED DUE TO COLOR INTERFERENCE OF URINE PIGMENT   LEUKOCYTESUR (A) 10/06/2017 1101    TEST NOT REPORTED DUE TO COLOR INTERFERENCE OF URINE PIGMENT   Sepsis Labs: @LABRCNTIP (procalcitonin:4,lacticidven:4)  ) Recent Results (from the past 240 hour(s))  Urine culture     Status: Abnormal   Collection Time: 10/06/17 11:39 AM  Result Value Ref Range Status   Specimen Description URINE, CATHETERIZED  Final   Special Requests   Final    NONE Performed at [facility], [place] [address] [place], [place] [address]    Culture >=100,000 COLONIES/mL ENTEROBACTER SPECIES (A)  Final   Report Status 10/08/2017 FINAL  Final   Organism ID, Bacteria ENTEROBACTER SPECIES (A)  Final      Susceptibility   Enterobacter species - MIC*    CEFAZOLIN >=64 RESISTANT Resistant     CEFTRIAXONE >=64 RESISTANT Resistant     CIPROFLOXACIN <=0.25 SENSITIVE Sensitive     GENTAMICIN <=1 SENSITIVE Sensitive     IMIPENEM 0.5 SENSITIVE Sensitive     NITROFURANTOIN 32 SENSITIVE Sensitive     TRIMETH/SULFA <=20 SENSITIVE Sensitive     PIP/TAZO >=128 RESISTANT Resistant     * >=100,000 COLONIES/mL ENTEROBACTER SPECIES  Culture, blood (Routine x 2)     Status: None   Collection Time: 10/06/17 11:40 AM  Result Value Ref Range Status   Specimen Description BLOOD RIGHT UPPER ARM  Final   Special Requests   Final    BOTTLES DRAWN AEROBIC AND ANAEROBIC Blood Culture adequate volume   Culture   Final    NO GROWTH 5 DAYS Performed at [facility], [address], [place], [place] [address]    Report Status 10/11/2017 FINAL  Final  Culture, blood (Routine x 2)     Status: None   Collection Time:  10/06/17 11:50 AM  Result Value Ref Range Status   Specimen Description BLOOD LEFT ANTECUBITAL  Final   Special Requests   Final    BOTTLES DRAWN AEROBIC AND ANAEROBIC Blood Culture results may not be optimal due to an excessive volume of blood received in culture bottles   Culture   Final    NO GROWTH 5 DAYS Performed at [facility], [place] [address], [place], [place] [address]    Report Status 10/11/2017 FINAL  Final  MRSA PCR Screening     Status: Abnormal   Collection Time: 10/09/17 12:20 PM  Result Value Ref Range Status   MRSA by PCR POSITIVE (A) NEGATIVE Final    Comment:        The GeneXpert MRSA Assay (  FDA approved for NASAL specimens only), is one component of a comprehensive MRSA colonization surveillance program. It is not intended to diagnose MRSA infection nor to guide or monitor treatment for MRSA infections. RESULT CALLED TO, READ BACK BY AND VERIFIED WITH: RN Leron Croak [phone] MLM Performed at [place] [address], [place], [place] [address]          Radiology Studies: No results found.      Scheduled Meds: . Chlorhexidine Gluconate Cloth  6 each Topical Daily  . darbepoetin (ARANESP) injection - NON-DIALYSIS  200 mcg Subcutaneous Q Mon-1800  . feeding supplement (NEPRO CARB STEADY)  237 mL Oral TID BM  . mouth rinse  15 mL Mouth Rinse BID  . mupirocin ointment  1 application Nasal BID  . pantoprazole  40 mg Oral Daily  . sodium chloride flush  10-40 mL Intracatheter Q12H   Continuous Infusions: . sodium chloride Stopped (10/09/17 2117)  . sodium chloride    . meropenem (MERREM) IV Stopped (10/11/17 2319)     LOS: 6 days    Time spent: 40 minutes    WOODS, Geraldo Docker, MD [facility] Pager [phone]   If 7PM-7AM, please contact night-coverage [email or web address] Password [facility] 10/12/2017, 7:27 AM

## 2017-10-12 NOTE — Procedures (Signed)
Patient was seen on dialysis and the procedure was supervised.  BFR 300  Via vascath BP is  106/63.   Patient appears to be tolerating treatment well  Kishana Battey A 10/12/2017

## 2017-10-12 NOTE — Plan of Care (Signed)
Continue with current plan of care.

## 2017-10-12 NOTE — Progress Notes (Signed)
Subjective:   hemodynamically stable but slightly confused - essentially no UOP - seen in HD- still confused   Objective Vital signs in last 24 hours: Vitals:   10/12/17 0359 10/12/17 0705 10/12/17 0724 10/12/17 0729  BP:  108/72 (!) 110/52 106/63  Pulse:  83 80 90  Resp:  (!) 33 (!) 28 (!) 29  Temp:  97.7 F (36.5 C)    TempSrc:  Oral    SpO2:  98%    Weight: 92.6 kg (204 lb 2.3 oz) 88.9 kg (195 lb 15.8 oz)    Height:       Weight change: -0.6 kg (-1 lb 5.2 oz)  Intake/Output Summary (Last 24 hours) at 10/12/2017 8416 Last data filed at 10/12/2017 0000 Gross per 24 hour  Intake 247 ml  Output -  Net 247 ml    Assessment/ Plan: Pt is a 67 y.o. yo male NHP, EF 35% baseline CKD- crt mid 1's who was admitted on 10/06/2017 with urosepsis and AKI  Assessment/Plan: 1. Renal- A on CKD- thought due to ATN/septic shock- req initiation of CRRT on 5/10- stopped on 5/13.  Still no UOP - plan on IHD today.  Vascath placed 5/10.  If no UOP will likely need HD again on Sat  2. Urosepsis- enterobacter- meropenam  3. Anemia- on ESA- no iron due to infection- supportive care, transfuse as needed, hgb 7.3- transfusion ordered for HD today 4. Elytes- K , phos and Ca OK 5. HTN/volume- has pitting edema but also likely third spacing- not making urine so any removal will need to be via UF- on room air- not responsive to  Lasix in past- since essentially no UOP have taken out foley  Markos Theil A    Labs: Basic Metabolic Panel: Recent Labs  Lab 10/10/17 0339 10/11/17 0430 10/12/17 0400  NA 135 132* 131*  K 4.1 4.3 3.9  CL 96* 95* 94*  CO2 29 26 26   GLUCOSE 82 65 108*  BUN 23* 29* 37*  CREATININE 3.29* 4.21* 5.54*  CALCIUM 8.2* 8.8* 8.4*  PHOS 3.4 4.3 4.5   Liver Function Tests: Recent Labs  Lab 10/06/17 1123 10/06/17 2007  10/10/17 0339 10/11/17 0430 10/12/17 0400  AST 9* 11*  9*  --   --   --   --   ALT 12* 11*  12*  --   --   --   --   ALKPHOS 100 94  94  --   --    --   --   BILITOT 1.5* 1.8*  1.6*  --   --   --   --   PROT 6.9 6.6  6.5  --   --   --   --   ALBUMIN 2.6* 2.5*  2.3*   < > 2.2* 2.2* 2.2*   < > = values in this interval not displayed.   No results for input(s): LIPASE, AMYLASE in the last 168 hours. No results for input(s): AMMONIA in the last 168 hours. CBC: Recent Labs  Lab 10/08/17 0409 10/09/17 0350 10/10/17 0339 10/11/17 0430 10/12/17 0627  WBC 4.4 4.3 4.4 5.2 2.1*  NEUTROABS 3.3 3.0 3.1 3.7  --   HGB 8.2* 8.0* 7.7* 7.7* 7.3*  HCT 24.4* 24.9* 24.2* 24.2* 23.4*  MCV 80.5 84.1 86.1 86.1 85.1  PLT 39* 32* 33* 44* 58*   Cardiac Enzymes: Recent Labs  Lab 10/06/17 2007 10/07/17 0307  TROPONINI 0.33* 0.55*   CBG: Recent Labs  Lab 10/11/17 1202 10/11/17  1552 10/11/17 1905 10/12/17 0009 10/12/17 0452  GLUCAP 80 112* 98 86 84    Iron Studies:  Recent Labs    10/11/17 0920  IRON 45  TIBC 148*  FERRITIN 686*   Studies/Results: No results found. Medications: Infusions: . sodium chloride Stopped (10/09/17 2117)  . sodium chloride    . sodium chloride    . meropenem (MERREM) IV Stopped (10/11/17 2319)    Scheduled Medications: . Chlorhexidine Gluconate Cloth  6 each Topical Daily  . darbepoetin (ARANESP) injection - NON-DIALYSIS  200 mcg Subcutaneous Q Mon-1800  . feeding supplement (NEPRO CARB STEADY)  237 mL Oral TID BM  . mouth rinse  15 mL Mouth Rinse BID  . mupirocin ointment  1 application Nasal BID  . pantoprazole  40 mg Oral Daily  . sodium chloride flush  10-40 mL Intracatheter Q12H    have reviewed scheduled and prn medications.  Physical Exam: General: alert, confused Heart: RRR Lungs: mostly clear Abdomen: soft, non tender Extremities: pitting dep edema Dialysis Access: left IJ vascath placed 5/10    10/12/2017,9:27 AM  LOS: 6 days

## 2017-10-12 NOTE — Progress Notes (Signed)
PT Cancellation Note  Patient Details Name: Patrick [name] MRN: [id number] DOB: 1950/10/24   Cancelled Treatment:    Reason Eval/Treat Not Completed: Patient at procedure or test/unavailable   Yuliana Vandrunen B Jayzon Taras 10/12/2017, 7:33 AM  Elwyn Reach, [place]

## 2017-10-12 NOTE — Care Management Note (Signed)
Case Management Note  Patient Details  Name: Patrick [name] MRN: [id number] Date of Birth: May 22, 1951  Subjective/Objective:   Pt with urosepsis and AKI               Action/Plan:  PTA for [facility] as a long term pt.  Pt now requiring IHD - not yet determined if this will be perm.  CM will continue to follow for discharge needs   Expected Discharge Date:                  Expected Discharge Plan:  Skilled Nursing Facility(From [facility])  In-House Referral:  Clinical Social Work  Discharge planning Services  CM Consult  Post Acute Care Choice:    Choice offered to:     DME Arranged:    DME Agency:     HH Arranged:    HH Agency:     Status of Service:     If discussed at [employer], dates discussed:    Additional Comments:  Maryclare Labrador, RN 10/12/2017, 1:54 PM

## 2017-10-12 NOTE — Progress Notes (Signed)
Pt had 11 beat run of V-tach at 0134. MD notified. Will continue to monitor.

## 2017-10-13 DIAGNOSIS — A419 Sepsis, unspecified organism: Secondary | ICD-10-CM

## 2017-10-13 DIAGNOSIS — R6521 Severe sepsis with septic shock: Secondary | ICD-10-CM

## 2017-10-13 DIAGNOSIS — N289 Disorder of kidney and ureter, unspecified: Secondary | ICD-10-CM

## 2017-10-13 LAB — GLUCOSE, CAPILLARY
GLUCOSE-CAPILLARY: 81 mg/dL (ref 65–99)
GLUCOSE-CAPILLARY: 136 mg/dL — AB (ref 65–99)
GLUCOSE-CAPILLARY: 93 mg/dL (ref 65–99)
GLUCOSE-CAPILLARY: 79 mg/dL (ref 65–99)
Glucose-Capillary: 84 mg/dL (ref 65–99)
Glucose-Capillary: 90 mg/dL (ref 65–99)

## 2017-10-13 LAB — BPAM RBC
BLOOD PRODUCT EXPIRATION DATE: 201906012359
Blood Product Expiration Date: 201905222359
ISSUE DATE / TIME: 201905161001
ISSUE DATE / TIME: 201905161001
UNIT TYPE AND RH: 9500
Unit Type and Rh: 9500

## 2017-10-13 LAB — TYPE AND SCREEN
ABO/RH(D): O NEG
Antibody Screen: NEGATIVE
UNIT DIVISION: 0
UNIT DIVISION: 0

## 2017-10-13 LAB — CBC
HEMATOCRIT: 28 % — AB (ref 39.0–52.0)
HEMOGLOBIN: 9 g/dL — AB (ref 13.0–17.0)
MCH: 27 pg (ref 26.0–34.0)
MCHC: 32.1 g/dL (ref 30.0–36.0)
MCV: 84.1 fL (ref 78.0–100.0)
Platelets: 78 10*3/uL — ABNORMAL LOW (ref 150–400)
RBC: 3.33 MIL/uL — AB (ref 4.22–5.81)
RDW: 19.7 % — ABNORMAL HIGH (ref 11.5–15.5)
WBC: 5.6 10*3/uL (ref 4.0–10.5)

## 2017-10-13 LAB — RENAL FUNCTION PANEL
Albumin: 2.2 g/dL — ABNORMAL LOW (ref 3.5–5.0)
Anion gap: 9 (ref 5–15)
BUN: 25 mg/dL — ABNORMAL HIGH (ref 6–20)
CHLORIDE: 97 mmol/L — AB (ref 101–111)
CO2: 29 mmol/L (ref 22–32)
Calcium: 8.3 mg/dL — ABNORMAL LOW (ref 8.9–10.3)
Creatinine, Ser: 4.41 mg/dL — ABNORMAL HIGH (ref 0.61–1.24)
GFR calc Af Amer: 14 mL/min — ABNORMAL LOW (ref >60)
GFR, EST NON AFRICAN AMERICAN: 12 mL/min — AB (ref >60)
Glucose, Bld: 100 mg/dL — ABNORMAL HIGH (ref 65–99)
POTASSIUM: 3.5 mmol/L (ref 3.5–5.1)
Phosphorus: 3.2 mg/dL (ref 2.5–4.6)
Sodium: 135 mmol/L (ref 135–145)

## 2017-10-13 LAB — MAGNESIUM: Magnesium: 1.9 mg/dL (ref 1.7–2.4)

## 2017-10-13 LAB — HEPATITIS B SURFACE ANTIBODY,QUALITATIVE: HEP B S AB: NONREACTIVE

## 2017-10-13 LAB — HEPATITIS B CORE ANTIBODY, TOTAL: Hep B Core Total Ab: NEGATIVE

## 2017-10-13 NOTE — Progress Notes (Signed)
Pharmacy Antibiotic Note  Patrick [name] is a 67 y.o. male admitted on 10/06/2017 with sepsis. Pt growing enterobacter resistant to zosyn in urine on Meropenem. Pt is afebrile and WBC is WNL.   CRRT stopped on 5/13. Now on iHD. Tolerated HD session yesterday. Remains anuric.   Plan: Continue meropenem 500 mg IV Q 24 hours while on HD Stop date in place for 5/18 (7 days of therapy).  Follow-up renal plans for HD vs observation.   Height: 5\' 6"  (167.6 cm) Weight: 192 lb (87.1 kg) IBW/kg (Calculated) : 63.8  Temp (24hrs), Avg:98 F (36.7 C), Min:97.6 F (36.4 C), Max:99.1 F (37.3 C)  Recent Labs  Lab 10/06/17 1205  10/09/17 0350 10/09/17 1545 10/10/17 0339 10/11/17 0430 10/12/17 0400 10/12/17 0627 10/13/17 0340 10/13/17 0350  WBC  --    < > 4.3  --  4.4 5.2  --  2.1*  --  5.6  CREATININE  --    < > 3.14* 2.64* 3.29* 4.21* 5.54*  --  4.41*  --   LATICACIDVEN 0.30*  --   --   --   --   --   --   --   --   --    < > = values in this interval not displayed.    Estimated Creatinine Clearance: 16.3 mL/min (A) (by C-G formula based on SCr of 4.41 mg/dL (H)).    Allergies  Allergen Reactions  . Nsaids     Kidney disease    Antimicrobials this admission: Meropenem 5/12>>5(18) Ceftriaxone 5/10 x 1 Zosyn 5/10>>5/12 Vanc 5/10>>5/12  5/10 blood x 2 - Neg 5/10 urine - enterobacter (R-Zosyn)  Thank you for allowing pharmacy to be a part of this patient's care.  Albertina Parr, PharmD., BCPS Clinical Pharmacist Clinical phone for 10/13/17 until 3:30pm: [phone] If after 3:30pm, please call main pharmacy at: [phone]

## 2017-10-13 NOTE — Progress Notes (Signed)
[facility] TEAM 1 - Stepdown/ICU TEAM  Patrick [name]  ZOX:[id number] DOB: Nov 06, 1950 DOA: 10/06/2017 PCP: Patient, [name]    Brief Narrative:  67 yo M SNF resident w/ a Hx CAD, Chronic Systolic CHF (EF 81-19%), remote Colon Cancer w/ perm end colostomy in 2018, CKD Stage III-IV, SBO, and an enlarged prostate who presented to the ED 5/10 after being found w/ altered LOC and several lab abnormalities including cr 13.35 and K 5.86m Hgb 7.5, and hypoglycemia.  He was also hypotensive w/ SBP in 70s.   Significant Events: 5/10 admit - transfused 1U PRBC - Vascath placed - CRRT initiated 5/13 CRRT stopped  5/11 TTE - diffuse hypokinesis - EF 35-40% - mild mitral regurg 5/16 IHD  Subjective: Patient is resting comfortably in a bedside chair.  He reports some shortness of breath at night but states it is transient.  He denies chest pain nausea vomiting or abdominal pain.  He reports a good appetite.  Assessment & Plan:  Acute encephalopathy Resolved - avoiding sedating meds   Chronic Systolic CHF  EF 30 to 14% - no significant volume overload on exam  Filed Weights   10/12/17 0705 10/12/17 1107 10/13/17 0500  Weight: 88.9 kg (195 lb 15.8 oz) 87.4 kg (192 lb 10.9 oz) 87.1 kg (192 lb)    Hypovolemic shock Resolved  Paroxysmal atrial fibrillation / bradycardia with frequent pauses  Off anticoag due to thrombocytopenia - resume as soon as plts at 100K or > -rate presently well controlled  Acute on CKD stage III/IV ATN in setting of septic shock - baseline Cr 1.6 - on admission Cr 14.42 - on HD per Nephrology  Recent Labs  Lab 10/09/17 1545 10/10/17 0339 10/11/17 0430 10/12/17 0400 10/13/17 0340  CREATININE 2.64* 3.29* 4.21* 5.54* 4.41*    Enterobacter UTI Complete course of meropenem -clinically stabilizing  Anemia of Chronic Disease Hgb December 2018 11.4 - fecal occult chronically positive x 1 year ago - anemia panel most consistent with ACD - s/p 4U PRBC this admit    Recent Labs  Lab 10/10/17 0339 10/11/17 0430 10/12/17 0627 10/13/17 0350  HGB 7.7* 7.7* 7.3* 9.0*    Acute on chronic thrombocytopenia platelets December 2018 111-123 - likely secondary to gram neg rod UTI - TTP unlikely given negative schistocytes and improving creatinine, negative jaundice - DIC panel equivocal  Recent Labs  Lab 10/09/17 0350 10/10/17 0339 10/11/17 0430 10/12/17 0627 10/13/17 0350  PLT 32* 33* 44* 58* 78*     Leg Cramps - RLS Baclofen TID PRN + PRN Heat pads   MRSA screen +  DVT prophylaxis: SCDs Code Status: FULL CODE Family Communication: no family present at time of exam  Disposition Plan: transfer to tele - cont HD - follow Plts and Hgb   Consultants:  PCCM Nephrology  Antimicrobials:  Zosyn 5/10 > 5/11 Meropenem 5/12 >  Objective: Blood pressure 125/65, pulse 80, temperature 98.9 F (37.2 C), temperature source Oral, resp. rate (!) 27, height 5\' 6"  (1.676 m), weight 87.1 kg (192 lb), SpO2 97 %.  Intake/Output Summary (Last 24 hours) at 10/13/2017 0941 Last data filed at 10/12/2017 2300 Gross per 24 hour  Intake 1224 ml  Output 1600 ml  Net -376 ml   Filed Weights   10/12/17 0705 10/12/17 1107 10/13/17 0500  Weight: 88.9 kg (195 lb 15.8 oz) 87.4 kg (192 lb 10.9 oz) 87.1 kg (192 lb)    Examination: General: No acute respiratory distress Lungs: Clear to  auscultation bilaterally without wheezes or crackles Cardiovascular: Regular rate without murmur gallop or rub normal S1 and S2 Abdomen: Nontender, nondistended, soft, bowel sounds positive, no rebound, no ascites, no appreciable mass Extremities: No significant cyanosis, clubbing, or edema bilateral lower extremities  CBC: Recent Labs  Lab 10/09/17 0350 10/10/17 0339 10/11/17 0430 10/12/17 0627 10/13/17 0350  WBC 4.3 4.4 5.2 2.1* 5.6  NEUTROABS 3.0 3.1 3.7  --   --   HGB 8.0* 7.7* 7.7* 7.3* 9.0*  HCT 24.9* 24.2* 24.2* 23.4* 28.0*  MCV 84.1 86.1 86.1 85.1 84.1  PLT  32* 33* 44* 58* 78*   Basic Metabolic Panel: Recent Labs  Lab 10/11/17 0430 10/12/17 0400 10/13/17 0340  NA 132* 131* 135  K 4.3 3.9 3.5  CL 95* 94* 97*  CO2 26 26 29   GLUCOSE 65 108* 100*  BUN 29* 37* 25*  CREATININE 4.21* 5.54* 4.41*  CALCIUM 8.8* 8.4* 8.3*  MG 2.2 2.1 1.9  PHOS 4.3 4.5 3.2   GFR: Estimated Creatinine Clearance: 16.3 mL/min (A) (by C-G formula based on SCr of 4.41 mg/dL (H)).  Liver Function Tests: Recent Labs  Lab 10/06/17 1123 10/06/17 2007  10/10/17 0339 10/11/17 0430 10/12/17 0400 10/13/17 0340  AST 9* 11*  9*  --   --   --   --   --   ALT 12* 11*  12*  --   --   --   --   --   ALKPHOS 100 94  94  --   --   --   --   --   BILITOT 1.5* 1.8*  1.6*  --   --   --   --   --   PROT 6.9 6.6  6.5  --   --   --   --   --   ALBUMIN 2.6* 2.5*  2.3*   < > 2.2* 2.2* 2.2* 2.2*   < > = values in this interval not displayed.    Coagulation Profile: Recent Labs  Lab 10/06/17 1123 10/06/17 2007  INR 2.28 2.17    Cardiac Enzymes: Recent Labs  Lab 10/06/17 2007 10/07/17 0307  TROPONINI 0.33* 0.55*    HbA1C: Hemoglobin A1C  Date/Time Value Ref Range Status  07/17/2015 10:40 AM 4.9  Final  10/03/2014 11:58 AM 5.0  Final   Hgb A1c MFr Bld  Date/Time Value Ref Range Status  09/23/2016 03:28 PM 5.7 (H) 4.8 - 5.6 % Final    Comment:    (NOTE)         Pre-diabetes: 5.7 - 6.4         Diabetes: >6.4         Glycemic control for adults with diabetes: <7.0   01/26/2010 09:21 PM 5.6 <5.7 % Final    Comment:    See lab report for associated comment(s)    CBG: Recent Labs  Lab 10/12/17 1613 10/12/17 1916 10/12/17 2335 10/13/17 0442 10/13/17 0821  GLUCAP 104* 97 112* 84 81    Recent Results (from the past 240 hour(s))  Urine culture     Status: Abnormal   Collection Time: 10/06/17 11:39 AM  Result Value Ref Range Status   Specimen Description URINE, CATHETERIZED  Final   Special Requests   Final    NONE Performed at [facility], [place] [address], [place], [place] [address]    Culture >=100,000 COLONIES/mL ENTEROBACTER SPECIES (A)  Final   Report Status 10/08/2017 FINAL  Final  Organism ID, Bacteria ENTEROBACTER SPECIES (A)  Final      Susceptibility   Enterobacter species - MIC*    CEFAZOLIN >=64 RESISTANT Resistant     CEFTRIAXONE >=64 RESISTANT Resistant     CIPROFLOXACIN <=0.25 SENSITIVE Sensitive     GENTAMICIN <=1 SENSITIVE Sensitive     IMIPENEM 0.5 SENSITIVE Sensitive     NITROFURANTOIN 32 SENSITIVE Sensitive     TRIMETH/SULFA <=20 SENSITIVE Sensitive     PIP/TAZO >=128 RESISTANT Resistant     * >=100,000 COLONIES/mL ENTEROBACTER SPECIES  Culture, blood (Routine x 2)     Status: None   Collection Time: 10/06/17 11:40 AM  Result Value Ref Range Status   Specimen Description BLOOD RIGHT UPPER ARM  Final   Special Requests   Final    BOTTLES DRAWN AEROBIC AND ANAEROBIC Blood Culture adequate volume   Culture   Final    NO GROWTH 5 DAYS Performed at [facility], [address], [place], [place] [address]    Report Status 10/11/2017 FINAL  Final  Culture, blood (Routine x 2)     Status: None   Collection Time: 10/06/17 11:50 AM  Result Value Ref Range Status   Specimen Description BLOOD LEFT ANTECUBITAL  Final   Special Requests   Final    BOTTLES DRAWN AEROBIC AND ANAEROBIC Blood Culture results may not be optimal due to an excessive volume of blood received in culture bottles   Culture   Final    NO GROWTH 5 DAYS Performed at [facility], [place] [address] [place], [place] [address]    Report Status 10/11/2017 FINAL  Final  MRSA PCR Screening     Status: Abnormal   Collection Time: 10/09/17 12:20 PM  Result Value Ref Range Status   MRSA by PCR POSITIVE (A) NEGATIVE Final    Comment:        The GeneXpert MRSA Assay (FDA approved for NASAL specimens only), is one component of a comprehensive MRSA colonization surveillance program. It is not intended to diagnose  MRSA infection nor to guide or monitor treatment for MRSA infections. RESULT CALLED TO, READ BACK BY AND VERIFIED WITH: RN Leron Croak [phone] MLM Performed at [facility] [address] [place], [place] [address]      Scheduled Meds: . Chlorhexidine Gluconate Cloth  6 each Topical Daily  . darbepoetin (ARANESP) injection - NON-DIALYSIS  200 mcg Subcutaneous Q Mon-1800  . feeding supplement (NEPRO CARB STEADY)  237 mL Oral TID BM  . mouth rinse  15 mL Mouth Rinse BID  . mupirocin ointment  1 application Nasal BID  . pantoprazole  40 mg Oral Daily  . sodium chloride flush  10-40 mL Intracatheter Q12H     LOS: 7 days   Cherene Altes, MD [facility] Office  [phone] Pager - Text Page per Amion as per below:  On-Call/Text Page:      [email or web address]      password [email or web address]  If 7PM-7AM, please contact night-coverage [email or web address] Password [facility] 10/13/2017, 9:41 AM

## 2017-10-13 NOTE — Progress Notes (Signed)
Physical Therapy Treatment Patient Details Name: Patrick [name] MRN: [id number] DOB: 11-19-1950 Today's Date: 10/13/2017    History of Present Illness Pt adm with acute metabolic encephalopathy, shock, and acute on chronic kidney failure. Pt intubated 5/10-5/13 and on CRRT 5/10-5/13. PMH - Rectal CA, colostomy, CAD, afib, HTN, heart failure, ckd    PT Comments    Pt progressing well. Less confusion today. A&O x 4. He required mod assist bed mobility, mod assist sit to stand and mod assist SPT with RW bed to recliner. +2 assist utilized for safety during transfer. PT to continue per POC.    Follow Up Recommendations  SNF     Equipment Recommendations  None recommended by PT    Recommendations for Other Services       Precautions / Restrictions Precautions Precautions: Fall    Mobility  Bed Mobility Overal bed mobility: Needs Assistance Bed Mobility: Supine to Sit     Supine to sit: Mod assist     General bed mobility comments: cues for sequencing, +rail, use of bed pad to scoot to EOB  Transfers Overall transfer level: Needs assistance Equipment used: Rolling walker (2 wheeled) Transfers: Sit to/from [facility] Sit to Stand: Mod assist Stand pivot transfers: Mod assist;+2 safety/equipment       General transfer comment: cues for hand placement, increased time and effort to power up. Unable to attain full upright stance. Pt maintained flexed position to perform pivot steps with RW bed to recliner.   Ambulation/Gait             General Gait Details: Pt able to take 4-5 pivot steps bed to recliner with RW and mod assist. +2 utilized for safety.   Stairs             Wheelchair Mobility    Modified Rankin (Stroke Patients Only)       Balance Overall balance assessment: Needs assistance Sitting-balance support: No upper extremity supported;Feet supported   Sitting balance - Comments: Pt sat EOB x 5 minutes supervision. Able to  perform dynamic sitting activities without LOB.    Standing balance support: Bilateral upper extremity supported;During functional activity Standing balance-Leahy Scale: Poor Standing balance comment: RW needed for support                            Cognition Arousal/Alertness: Awake/alert Behavior During Therapy: WFL for tasks assessed/performed Overall Cognitive Status: Impaired/Different from baseline Area of Impairment: Memory                     Memory: Decreased short-term memory Following Commands: Follows one step commands consistently;Follows multi-step commands inconsistently;Follows multi-step commands with increased time   Awareness: Emergent Problem Solving: Difficulty sequencing;Requires verbal cues General Comments: Pt with less confusion today. A&O x 4. Continues to demonstrate deficits in safety awareness, memory and problem solving.       Exercises General Exercises - Lower Extremity Ankle Circles/Pumps: AROM;Both;10 reps;Seated Short Arc Quad: AROM;Left;10 reps;Seated Hip Flexion/Marching: AROM;Right;Left;10 reps;Seated    General Comments        Pertinent Vitals/Pain Pain Assessment: Faces Faces Pain Scale: Hurts even more Pain Location: bilat knees Pain Descriptors / Indicators: Aching;Grimacing;Guarding Pain Intervention(s): Monitored during session;Repositioned    Home Living                      Prior Function  PT Goals (current goals can now be found in the care plan section) Acute Rehab PT Goals Patient Stated Goal: to walk PT Goal Formulation: With patient Time For Goal Achievement: 10/24/17 Potential to Achieve Goals: Fair Progress towards PT goals: Progressing toward goals    Frequency    Min 2X/week      PT Plan Current plan remains appropriate    Co-evaluation              AM-PAC PT "6 Clicks" Daily Activity  Outcome Measure  Difficulty turning over in bed (including  adjusting bedclothes, sheets and blankets)?: A Lot Difficulty moving from lying on back to sitting on the side of the bed? : Unable Difficulty sitting down on and standing up from a chair with arms (e.g., wheelchair, bedside commode, etc,.)?: Unable Help needed moving to and from a bed to chair (including a wheelchair)?: A Lot Help needed walking in hospital room?: A Lot Help needed climbing 3-5 steps with a railing? : Total 6 Click Score: 9    End of Session Equipment Utilized During Treatment: Gait belt Activity Tolerance: Patient tolerated treatment well Patient left: in chair;with call bell/phone within reach Nurse Communication: Mobility status PT Visit Diagnosis: Other abnormalities of gait and mobility (R26.89);Difficulty in walking, not elsewhere classified (R26.2);Muscle weakness (generalized) (M62.81)     Time: 5465-6812 PT Time Calculation (min) (ACUTE ONLY): 28 min  Charges:  $Therapeutic Activity: 23-37 mins                    G Codes:       Lorrin Goodell, PT  Office # [phone] Pager [phone]    Lorriane Shire 10/13/2017, 10:09 AM

## 2017-10-13 NOTE — Progress Notes (Signed)
Subjective:   HD yest- removed 1500, tolerated well - essentially no UOP - - pleasant but still slightly confused   Objective Vital signs in last 24 hours: Vitals:   10/13/17 0300 10/13/17 0443 10/13/17 0500 10/13/17 0700  BP:  120/61  125/65  Pulse:  74  80  Resp: (!) 22 (!) 21  (!) 27  Temp:  98.2 F (36.8 C)  98.9 F (37.2 C)  TempSrc:  Oral  Oral  SpO2:  97%  97%  Weight:   87.1 kg (192 lb)   Height:       Weight change: -3.7 kg (-8 lb 2.5 oz)  Intake/Output Summary (Last 24 hours) at 10/13/2017 0855 Last data filed at 10/12/2017 2300 Gross per 24 hour  Intake 1224 ml  Output 1600 ml  Net -376 ml    Assessment/ Plan: Pt is a 67 y.o. yo male NHP, EF 35% baseline CKD- crt mid 1's who was admitted on 10/06/2017 with urosepsis and AKI  Assessment/Plan: 1. Renal- A on CKD- thought due to ATN/septic shock- req initiation of CRRT on 5/10- stopped on 5/13.   IHD 5/16.  Vascath placed 5/10.  If no UOP will likely need HD again tomorrow 2. Urosepsis- enterobacter- meropenam  3. Anemia- on ESA- no iron due to infection- supportive care, transfused 5/16  4. Elytes- K , phos and Ca OK 5. HTN/volume- has pitting edema but also likely third spacing- not making urine so any removal will need to be via UF- on room air- not responsive to  Lasix in past- since essentially no UOP have taken out foley  Adilynn Bessey A    Labs: Basic Metabolic Panel: Recent Labs  Lab 10/11/17 0430 10/12/17 0400 10/13/17 0340  NA 132* 131* 135  K 4.3 3.9 3.5  CL 95* 94* 97*  CO2 26 26 29   GLUCOSE 65 108* 100*  BUN 29* 37* 25*  CREATININE 4.21* 5.54* 4.41*  CALCIUM 8.8* 8.4* 8.3*  PHOS 4.3 4.5 3.2   Liver Function Tests: Recent Labs  Lab 10/06/17 1123 10/06/17 2007  10/11/17 0430 10/12/17 0400 10/13/17 0340  AST 9* 11*  9*  --   --   --   --   ALT 12* 11*  12*  --   --   --   --   ALKPHOS 100 94  94  --   --   --   --   BILITOT 1.5* 1.8*  1.6*  --   --   --   --   PROT 6.9 6.6   6.5  --   --   --   --   ALBUMIN 2.6* 2.5*  2.3*   < > 2.2* 2.2* 2.2*   < > = values in this interval not displayed.   No results for input(s): LIPASE, AMYLASE in the last 168 hours. No results for input(s): AMMONIA in the last 168 hours. CBC: Recent Labs  Lab 10/09/17 0350 10/10/17 0339 10/11/17 0430 10/12/17 0627 10/13/17 0350  WBC 4.3 4.4 5.2 2.1* 5.6  NEUTROABS 3.0 3.1 3.7  --   --   HGB 8.0* 7.7* 7.7* 7.3* 9.0*  HCT 24.9* 24.2* 24.2* 23.4* 28.0*  MCV 84.1 86.1 86.1 85.1 84.1  PLT 32* 33* 44* 58* 78*   Cardiac Enzymes: Recent Labs  Lab 10/06/17 2007 10/07/17 0307  TROPONINI 0.33* 0.55*   CBG: Recent Labs  Lab 10/12/17 1222 10/12/17 1613 10/12/17 1916 10/12/17 2335 10/13/17 0442  GLUCAP 81 104* 97 112* 84  Iron Studies:  Recent Labs    10/11/17 0920  IRON 45  TIBC 148*  FERRITIN 686*   Studies/Results: No results found. Medications: Infusions: . sodium chloride Stopped (10/09/17 2117)  . sodium chloride    . sodium chloride    . meropenem (MERREM) IV Stopped (10/12/17 2321)    Scheduled Medications: . Chlorhexidine Gluconate Cloth  6 each Topical Daily  . darbepoetin (ARANESP) injection - NON-DIALYSIS  200 mcg Subcutaneous Q Mon-1800  . feeding supplement (NEPRO CARB STEADY)  237 mL Oral TID BM  . mouth rinse  15 mL Mouth Rinse BID  . mupirocin ointment  1 application Nasal BID  . pantoprazole  40 mg Oral Daily  . sodium chloride flush  10-40 mL Intracatheter Q12H    have reviewed scheduled and prn medications.  Physical Exam: General: alert, slightly confused Heart: RRR Lungs: mostly clear Abdomen: soft, non tender Extremities: pitting dep edema Dialysis Access: left IJ vascath placed 5/10    10/13/2017,8:55 AM  LOS: 7 days

## 2017-10-14 DIAGNOSIS — I5022 Chronic systolic (congestive) heart failure: Secondary | ICD-10-CM

## 2017-10-14 LAB — RENAL FUNCTION PANEL
Albumin: 2.3 g/dL — ABNORMAL LOW (ref 3.5–5.0)
Anion gap: 11 (ref 5–15)
BUN: 32 mg/dL — AB (ref 6–20)
CO2: 27 mmol/L (ref 22–32)
CREATININE: 5.33 mg/dL — AB (ref 0.61–1.24)
Calcium: 8.6 mg/dL — ABNORMAL LOW (ref 8.9–10.3)
Chloride: 97 mmol/L — ABNORMAL LOW (ref 101–111)
GFR calc Af Amer: 11 mL/min — ABNORMAL LOW (ref >60)
GFR calc non Af Amer: 10 mL/min — ABNORMAL LOW (ref >60)
GLUCOSE: 82 mg/dL (ref 65–99)
POTASSIUM: 3.6 mmol/L (ref 3.5–5.1)
Phosphorus: 4 mg/dL (ref 2.5–4.6)
Sodium: 135 mmol/L (ref 135–145)

## 2017-10-14 LAB — CBC
HCT: 29.8 % — ABNORMAL LOW (ref 39.0–52.0)
HEMOGLOBIN: 9.4 g/dL — AB (ref 13.0–17.0)
MCH: 27.3 pg (ref 26.0–34.0)
MCHC: 31.5 g/dL (ref 30.0–36.0)
MCV: 86.6 fL (ref 78.0–100.0)
Platelets: 86 10*3/uL — ABNORMAL LOW (ref 150–400)
RBC: 3.44 MIL/uL — ABNORMAL LOW (ref 4.22–5.81)
RDW: 20.6 % — ABNORMAL HIGH (ref 11.5–15.5)
WBC: 5.7 10*3/uL (ref 4.0–10.5)

## 2017-10-14 LAB — GLUCOSE, CAPILLARY
GLUCOSE-CAPILLARY: 86 mg/dL (ref 65–99)
GLUCOSE-CAPILLARY: 79 mg/dL (ref 65–99)
Glucose-Capillary: 85 mg/dL (ref 65–99)
Glucose-Capillary: 106 mg/dL — ABNORMAL HIGH (ref 65–99)
Glucose-Capillary: 84 mg/dL (ref 65–99)
Glucose-Capillary: 139 mg/dL — ABNORMAL HIGH (ref 65–99)
Glucose-Capillary: 115 mg/dL — ABNORMAL HIGH (ref 65–99)

## 2017-10-14 LAB — MAGNESIUM: Magnesium: 1.9 mg/dL (ref 1.7–2.4)

## 2017-10-14 MED ORDER — PENTAFLUOROPROP-TETRAFLUOROETH EX AERO
1.0000 "application " | INHALATION_SPRAY | CUTANEOUS | Status: DC | PRN
  Filled 2017-10-14: qty 30

## 2017-10-14 MED ORDER — ALTEPLASE 2 MG IJ SOLR: 2.0000 mg | Freq: Once | INTRAMUSCULAR | Status: DC | PRN

## 2017-10-14 MED ORDER — SODIUM CHLORIDE 0.9 % IV SOLN: 100.0000 mL | INTRAVENOUS | Status: DC | PRN

## 2017-10-14 MED ORDER — LIDOCAINE-PRILOCAINE 2.5-2.5 % EX CREA: 1.0000 "application " | TOPICAL_CREAM | CUTANEOUS | Status: DC | PRN

## 2017-10-14 MED ORDER — LIDOCAINE HCL (PF) 1 % IJ SOLN: 5.0000 mL | INTRAMUSCULAR | Status: DC | PRN

## 2017-10-14 MED ORDER — HEPARIN SODIUM (PORCINE) 1000 UNIT/ML DIALYSIS: 20.0000 [IU]/kg | INTRAMUSCULAR | Status: DC | PRN

## 2017-10-14 MED ORDER — HEPARIN SODIUM (PORCINE) 1000 UNIT/ML DIALYSIS: 1000.0000 [IU] | INTRAMUSCULAR | Status: DC | PRN

## 2017-10-14 NOTE — Procedures (Signed)
Patient was seen on dialysis and the procedure was supervised.  BFR 400  Via PC BP is  142/60.   Patient appears to be tolerating treatment well  Chelsa Stout A 10/14/2017

## 2017-10-14 NOTE — Plan of Care (Signed)
Continue with current care plan

## 2017-10-14 NOTE — Progress Notes (Signed)
[facility] PROGRESS NOTE  Patrick [name] YQM:[id number] DOB: 1950-07-06 DOA: 10/06/2017  PCP: Patient, [name]  Brief History/Interval Summary: 67 yo M SNF resident w/ a Hx CAD, Chronic Systolic CHF (EF 52-84%), remote Colon Cancer w/ perm end colostomy in 2018, CKD Stage III-IV, SBO, and an enlarged prostate who presented to the ED 5/10 after being found w/ altered LOC and several lab abnormalities including cr 13.35 and K 5.63m Hgb 7.5, and hypoglycemia.  He was also hypotensive w/ SBP in 70s.  He initially underwent CRRT.  Now getting intermittent dialysis.  Significant Events: 5/10 admit - transfused 1U PRBC - Vascath placed - CRRT initiated 5/13 CRRT stopped  5/11 TTE - diffuse hypokinesis - EF 35-40% - mild mitral regurg 5/16 IHD  Consultants:  PCCM Nephrology  Antimicrobials:  Zosyn 5/10 > 5/11 Meropenem 5/12 >  Procedures:   Transthoracic echocardiogram Study Conclusions  - Left ventricle: Diffuse hypokineis worse in inferior wall with   abnromal septal motion Wall thickness was increased in a pattern   of mild LVH. Systolic function was moderately reduced. The   estimated ejection fraction was in the range of 35% to 40%. The   study is not technically sufficient to allow evaluation of LV   diastolic function. - Mitral valve: There was mild regurgitation. - Left atrium: The atrium was severely dilated. - Right ventricle: The cavity size was moderately dilated. - Right atrium: The atrium was moderately dilated. - Atrial septum: No defect or patent foramen ovale was identified. - Tricuspid valve: There was moderate regurgitation.  Subjective/Interval History: Patient states that he feels well.  Denies any shortness of breath.  No nausea vomiting.  No chest pain.  Still not making any urine.  ROS: Not having any headaches  Objective:  Vital Signs  Vitals:   10/14/17 0300 10/14/17 0330 10/14/17 0621 10/14/17 0817  BP:  114/82  124/69  Pulse: 76 80  79   Resp: 18 (!) 25  18  Temp:  98 F (36.7 C)  98.1 F (36.7 C)  TempSrc:  Oral  Oral  SpO2: 96% 96%  96%  Weight:   89 kg (196 lb 3.4 oz)   Height:        Intake/Output Summary (Last 24 hours) at 10/14/2017 1158 Last data filed at 10/14/2017 0817 Gross per 24 hour  Intake 687 ml  Output 100 ml  Net 587 ml   Filed Weights   10/12/17 1107 10/13/17 0500 10/14/17 1324  Weight: 87.4 kg (192 lb 10.9 oz) 87.1 kg (192 lb) 89 kg (196 lb 3.4 oz)    General appearance: alert, cooperative, appears stated age and no distress Head: Normocephalic, without obvious abnormality, atraumatic Resp: Diminished air entry at the bases.  However no wheezing rales or rhonchi. Cardio: regular rate and rhythm, S1, S2 normal, systolic murmur appreciated over the precordium GI: soft, non-tender; bowel sounds normal; no masses,  no organomegaly Extremities: extremities normal, atraumatic, no cyanosis or edema Neurologic: Awake alert.  Oriented x3.  No obvious focal neurological deficits.  Lab Results:  Data Reviewed: I have personally reviewed following labs and imaging studies  CBC: Recent Labs  Lab 10/08/17 0409 10/09/17 0350 10/10/17 0339 10/11/17 0430 10/12/17 0627 10/13/17 0350 10/14/17 0256  WBC 4.4 4.3 4.4 5.2 2.1* 5.6 5.7  NEUTROABS 3.3 3.0 3.1 3.7  --   --   --   HGB 8.2* 8.0* 7.7* 7.7* 7.3* 9.0* 9.4*  HCT 24.4* 24.9* 24.2* 24.2* 23.4* 28.0*  29.8*  MCV 80.5 84.1 86.1 86.1 85.1 84.1 86.6  PLT 39* 32* 33* 44* 58* 78* 86*    Basic Metabolic Panel: Recent Labs  Lab 10/10/17 0339 10/11/17 0430 10/12/17 0400 10/13/17 0340 10/14/17 0245 10/14/17 0256  NA 135 132* 131* 135 135  --   K 4.1 4.3 3.9 3.5 3.6  --   CL 96* 95* 94* 97* 97*  --   CO2 29 26 26 29 27   --   GLUCOSE 82 65 108* 100* 82  --   BUN 23* 29* 37* 25* 32*  --   CREATININE 3.29* 4.21* 5.54* 4.41* 5.33*  --   CALCIUM 8.2* 8.8* 8.4* 8.3* 8.6*  --   MG 2.1 2.2 2.1 1.9  --  1.9  PHOS 3.4 4.3 4.5 3.2 4.0  --      GFR: Estimated Creatinine Clearance: 13.7 mL/min (A) (by C-G formula based on SCr of 5.33 mg/dL (H)).  Liver Function Tests: Recent Labs  Lab 10/10/17 0339 10/11/17 0430 10/12/17 0400 10/13/17 0340 10/14/17 0245  ALBUMIN 2.2* 2.2* 2.2* 2.2* 2.3*    CBG: Recent Labs  Lab 10/13/17 1702 10/13/17 1920 10/13/17 2313 10/14/17 0328 10/14/17 0819  GLUCAP 93 79 90 85 106*     Recent Results (from the past 240 hour(s))  Urine culture     Status: Abnormal   Collection Time: 10/06/17 11:39 AM  Result Value Ref Range Status   Specimen Description URINE, CATHETERIZED  Final   Special Requests   Final    NONE Performed at [facility], [address] [place], [place] [address]    Culture >=100,000 COLONIES/mL ENTEROBACTER SPECIES (A)  Final   Report Status 10/08/2017 FINAL  Final   Organism ID, Bacteria ENTEROBACTER SPECIES (A)  Final      Susceptibility   Enterobacter species - MIC*    CEFAZOLIN >=64 RESISTANT Resistant     CEFTRIAXONE >=64 RESISTANT Resistant     CIPROFLOXACIN <=0.25 SENSITIVE Sensitive     GENTAMICIN <=1 SENSITIVE Sensitive     IMIPENEM 0.5 SENSITIVE Sensitive     NITROFURANTOIN 32 SENSITIVE Sensitive     TRIMETH/SULFA <=20 SENSITIVE Sensitive     PIP/TAZO >=128 RESISTANT Resistant     * >=100,000 COLONIES/mL ENTEROBACTER SPECIES  Culture, blood (Routine x 2)     Status: None   Collection Time: 10/06/17 11:40 AM  Result Value Ref Range Status   Specimen Description BLOOD RIGHT UPPER ARM  Final   Special Requests   Final    BOTTLES DRAWN AEROBIC AND ANAEROBIC Blood Culture adequate volume   Culture   Final    NO GROWTH 5 DAYS Performed at [facility], [address], [place], [place] [address]    Report Status 10/11/2017 FINAL  Final  Culture, blood (Routine x 2)     Status: None   Collection Time: 10/06/17 11:50 AM  Result Value Ref Range Status   Specimen Description BLOOD LEFT ANTECUBITAL  Final   Special Requests   Final     BOTTLES DRAWN AEROBIC AND ANAEROBIC Blood Culture results may not be optimal due to an excessive volume of blood received in culture bottles   Culture   Final    NO GROWTH 5 DAYS Performed at [facility], [place] [address] [place], [place] [address]    Report Status 10/11/2017 FINAL  Final  MRSA PCR Screening     Status: Abnormal   Collection Time: 10/09/17 12:20 PM  Result Value Ref Range Status   MRSA by PCR POSITIVE (A) NEGATIVE Final    Comment:        The GeneXpert MRSA Assay (FDA approved for NASAL specimens only), is one component of a comprehensive MRSA colonization surveillance program. It is not intended to diagnose MRSA infection nor to guide or monitor treatment for MRSA infections. RESULT CALLED TO, READ BACK BY AND VERIFIED WITH: RN Leron Croak [id number] MLM Performed at [facility] [address], [place], [place] [address]       Radiology Studies: No results found.   Medications:  Scheduled: . Chlorhexidine Gluconate Cloth  6 each Topical Daily  . darbepoetin (ARANESP) injection - NON-DIALYSIS  200 mcg Subcutaneous Q Mon-1800  . feeding supplement (NEPRO CARB STEADY)  237 mL Oral TID BM  . mupirocin ointment  1 application Nasal BID  . pantoprazole  40 mg Oral Daily  . sodium chloride flush  10-40 mL Intracatheter Q12H   Continuous: . meropenem (MERREM) IV Stopped (10/13/17 2250)   PPI:RJJOACZY, oxyCODONE-acetaminophen, sodium chloride flush  Assessment/Plan:    Acute metabolic encephalopathy Appears to have been due to acute illness.  Currently resolved.  Avoid sedating medications.    ChronicSystolic CHF  EF 30 to 60%. No significant volume overload on exam.  No ARB or ACE inhibitor due to renal failure.  Home medication list reviewed.  It appears that he was on carvedilol previously.  Holding for now due to his need for dialysis as well as episodes of bradycardia and frequent pauses.  He was also in hypovolemic shock at the time  of admission.  Holding diuretics as well.  Volume being managed by dialysis.  Hypovolemic shock Resolved  Paroxysmal atrial fibrillation / bradycardia with frequent pauses Off anticoag due to thrombocytopenia.  Continue to monitor on telemetry for now.  He was on apixaban previously.  Acute on CKD stage III/IV ATN in setting of septic shock. Baseline Cr 1.6.  Admission creatinine was 14.4.  Nephrology was consulted.  Patient was initially on CRRT.  Now on intermittent dialysis.  He remains anuric.  Enterobacter UTI Being treated with meropenem.  He will complete course of antibiotics today.    AnemiaofChronicDisease Apparently has had chronically positive fecal occult blood.  He has required blood transfusions during this hospitalization.  Has received 4 units of PRBC so far.  Hemoglobin stable the last few days.  Continue to monitor.    Acute on chronic thrombocytopenia Thrombus cytopenia thought to be secondary to sepsis.  TTP thought to be unlikely given negative schistocytes.  DIC panel was equivocal.  Platelet counts remain low.  No evidence for bleeding.  Continue to monitor.    Leg Cramps - RLS Baclofen TID PRN + PRN Heat pads   DVT Prophylaxis: SCDs    Code Status: Partial code Family Communication: No family at bedside.  Discussed with the patient Disposition Plan: Management as outlined above.  Mobilize as tolerated.  Seen by physical therapy.  They are recommending skilled nursing facility for rehab.    LOS: 8 days   [facility] Pager [phone] 10/14/2017, 11:58 AM  If 7PM-7AM, please contact night-coverage at [email or web address], password [facility]

## 2017-10-14 NOTE — Progress Notes (Signed)
Subjective:  - essentially no UOP still  - - pleasant but still slightly confused   Objective Vital signs in last 24 hours: Vitals:   10/14/17 0300 10/14/17 0330 10/14/17 0621 10/14/17 0817  BP:  114/82  124/69  Pulse: 76 80  79  Resp: 18 (!) 25  18  Temp:  98 F (36.7 C)  98.1 F (36.7 C)  TempSrc:  Oral  Oral  SpO2: 96% 96%  96%  Weight:   89 kg (196 lb 3.4 oz)   Height:       Weight change: 0.1 kg (3.5 oz)  Intake/Output Summary (Last 24 hours) at 10/14/2017 0852 Last data filed at 10/14/2017 0300 Gross per 24 hour  Intake 684 ml  Output 50 ml  Net 634 ml    Assessment/ Plan: Pt is a 67 y.o. yo male NHP, EF 35% baseline CKD- crt mid 1's who was admitted on 10/06/2017 with urosepsis and AKI  Assessment/Plan: 1. Renal- A on CKD- thought due to ATN/septic shock- req initiation of CRRT on 5/10- stopped on 5/13.   IHD 5/16.  Vascath placed 5/10.  For HD again today  2. Urosepsis- enterobacter- meropenam  3. Anemia- on ESA- no iron due to infection- supportive care, transfused 5/16  4. Elytes- K , phos and Ca OK 5. HTN/volume- has pitting edema but also likely third spacing- not making urine so any removal will need to be via UF- on room air- not responsive to  Lasix in past- since essentially no UOP have taken out foley  Magenta Schmiesing A    Labs: Basic Metabolic Panel: Recent Labs  Lab 10/12/17 0400 10/13/17 0340 10/14/17 0245  NA 131* 135 135  K 3.9 3.5 3.6  CL 94* 97* 97*  CO2 26 29 27   GLUCOSE 108* 100* 82  BUN 37* 25* 32*  CREATININE 5.54* 4.41* 5.33*  CALCIUM 8.4* 8.3* 8.6*  PHOS 4.5 3.2 4.0   Liver Function Tests: Recent Labs  Lab 10/12/17 0400 10/13/17 0340 10/14/17 0245  ALBUMIN 2.2* 2.2* 2.3*   No results for input(s): LIPASE, AMYLASE in the last 168 hours. No results for input(s): AMMONIA in the last 168 hours. CBC: Recent Labs  Lab 10/09/17 0350 10/10/17 0339 10/11/17 0430 10/12/17 0627 10/13/17 0350 10/14/17 0256  WBC 4.3 4.4 5.2  2.1* 5.6 5.7  NEUTROABS 3.0 3.1 3.7  --   --   --   HGB 8.0* 7.7* 7.7* 7.3* 9.0* 9.4*  HCT 24.9* 24.2* 24.2* 23.4* 28.0* 29.8*  MCV 84.1 86.1 86.1 85.1 84.1 86.6  PLT 32* 33* 44* 58* 78* 86*   Cardiac Enzymes: No results for input(s): CKTOTAL, CKMB, CKMBINDEX, TROPONINI in the last 168 hours. CBG: Recent Labs  Lab 10/13/17 1702 10/13/17 1920 10/13/17 2313 10/14/17 0328 10/14/17 0819  GLUCAP 93 79 90 85 106*    Iron Studies:  Recent Labs    10/11/17 0920  IRON 45  TIBC 148*  FERRITIN 686*   Studies/Results: No results found. Medications: Infusions: . meropenem (MERREM) IV Stopped (10/13/17 2250)    Scheduled Medications: . Chlorhexidine Gluconate Cloth  6 each Topical Daily  . darbepoetin (ARANESP) injection - NON-DIALYSIS  200 mcg Subcutaneous Q Mon-1800  . feeding supplement (NEPRO CARB STEADY)  237 mL Oral TID BM  . mouth rinse  15 mL Mouth Rinse BID  . mupirocin ointment  1 application Nasal BID  . pantoprazole  40 mg Oral Daily  . sodium chloride flush  10-40 mL Intracatheter Q12H  have reviewed scheduled and prn medications.  Physical Exam: General: alert, slightly confused Heart: RRR Lungs: mostly clear Abdomen: soft, non tender Extremities: pitting dep edema Dialysis Access: left IJ vascath placed 5/10    10/14/2017,8:52 AM  LOS: 8 days

## 2017-10-15 LAB — RENAL FUNCTION PANEL
ALBUMIN: 2.3 g/dL — AB (ref 3.5–5.0)
ANION GAP: 9 (ref 5–15)
BUN: 23 mg/dL — ABNORMAL HIGH (ref 6–20)
CALCIUM: 8.4 mg/dL — AB (ref 8.9–10.3)
CO2: 28 mmol/L (ref 22–32)
Chloride: 98 mmol/L — ABNORMAL LOW (ref 101–111)
Creatinine, Ser: 3.95 mg/dL — ABNORMAL HIGH (ref 0.61–1.24)
GFR, EST AFRICAN AMERICAN: 16 mL/min — AB (ref >60)
GFR, EST NON AFRICAN AMERICAN: 14 mL/min — AB (ref >60)
Glucose, Bld: 82 mg/dL (ref 65–99)
PHOSPHORUS: 3.1 mg/dL (ref 2.5–4.6)
Potassium: 4 mmol/L (ref 3.5–5.1)
Sodium: 135 mmol/L (ref 135–145)

## 2017-10-15 LAB — GLUCOSE, CAPILLARY
GLUCOSE-CAPILLARY: 80 mg/dL (ref 65–99)
GLUCOSE-CAPILLARY: 74 mg/dL (ref 65–99)
GLUCOSE-CAPILLARY: 95 mg/dL (ref 65–99)
GLUCOSE-CAPILLARY: 89 mg/dL (ref 65–99)
Glucose-Capillary: 103 mg/dL — ABNORMAL HIGH (ref 65–99)
Glucose-Capillary: 105 mg/dL — ABNORMAL HIGH (ref 65–99)

## 2017-10-15 LAB — CBC
HCT: 29.5 % — ABNORMAL LOW (ref 39.0–52.0)
Hemoglobin: 9.2 g/dL — ABNORMAL LOW (ref 13.0–17.0)
MCH: 27.2 pg (ref 26.0–34.0)
MCHC: 31.2 g/dL (ref 30.0–36.0)
MCV: 87.3 fL (ref 78.0–100.0)
PLATELETS: 78 10*3/uL — AB (ref 150–400)
RBC: 3.38 MIL/uL — AB (ref 4.22–5.81)
RDW: 20.9 % — ABNORMAL HIGH (ref 11.5–15.5)
WBC: 4.1 10*3/uL (ref 4.0–10.5)

## 2017-10-15 LAB — MAGNESIUM: MAGNESIUM: 1.9 mg/dL (ref 1.7–2.4)

## 2017-10-15 NOTE — Progress Notes (Addendum)
[facility] PROGRESS NOTE  Patrick [name] FVC:[id number] DOB: 18-Dec-1950 DOA: 10/06/2017  PCP: Patient, [name]  Brief History/Interval Summary: 67 yo M SNF resident w/ a Hx CAD, Chronic Systolic CHF (EF 63-84%), remote Colon Cancer w/ perm end colostomy in 2018, CKD Stage III-IV, SBO, and an enlarged prostate who presented to the ED 5/10 after being found w/ altered LOC and several lab abnormalities including cr 13.35 and K 5.44m Hgb 7.5, and hypoglycemia.  He was also hypotensive w/ SBP in 70s.  He initially underwent CRRT.  Now getting intermittent dialysis.  Significant Events: 5/10 admit - transfused 1U PRBC - Vascath placed - CRRT initiated 5/13 CRRT stopped  5/11 TTE - diffuse hypokinesis - EF 35-40% - mild mitral regurg 5/16 HD  Consultants:  PCCM Nephrology  Antimicrobials:  Zosyn 5/10 > 5/11 Meropenem 5/12 >5/18  Procedures:   Transthoracic echocardiogram Study Conclusions  - Left ventricle: Diffuse hypokineis worse in inferior wall with   abnromal septal motion Wall thickness was increased in a pattern   of mild LVH. Systolic function was moderately reduced. The   estimated ejection fraction was in the range of 35% to 40%. The   study is not technically sufficient to allow evaluation of LV   diastolic function. - Mitral valve: There was mild regurgitation. - Left atrium: The atrium was severely dilated. - Right ventricle: The cavity size was moderately dilated. - Right atrium: The atrium was moderately dilated. - Atrial septum: No defect or patent foramen ovale was identified. - Tricuspid valve: There was moderate regurgitation.  Subjective/Interval History: Patient states that he feels well.  Denies any nausea or vomiting.  No shortness of breath.  Still not making any urine.    ROS: Denies any headaches.  Objective:  Vital Signs  Vitals:   10/14/17 2000 10/14/17 2323 10/15/17 0326 10/15/17 0801  BP:  117/60 120/71 121/76  Pulse: 79 79 87     Resp: (!) 29 (!) 22 (!) 22 15  Temp:  98.3 F (36.8 C) 98 F (36.7 C) 97.9 F (36.6 C)  TempSrc:  Oral Oral   SpO2: 97% 98% 98%   Weight:   87.2 kg (192 lb 3.9 oz)   Height:        Intake/Output Summary (Last 24 hours) at 10/15/2017 1015 Last data filed at 10/14/2017 2000 Gross per 24 hour  Intake 480 ml  Output 3100 ml  Net -2620 ml   Filed Weights   10/14/17 1245 10/14/17 1653 10/15/17 0326  Weight: 90.5 kg (199 lb 8.3 oz) 87.3 kg (192 lb 7.4 oz) 87.2 kg (192 lb 3.9 oz)    General appearance: Awake alert.  In no distress Resp: Wheezing rales or rhonchi.  Normal effort at rest. Cardio: S1-S2 is normal regular.  Systolic murmur appreciated over the precordium.  No S3-S4.  No rubs or bruit. GI: Abdomen is soft.  Nontender nondistended.  Bowel sounds are present.  No masses or organomegaly.  Ostomy bag noted with brown stool. Extremities: Bilateral lower extremity edema is present Neurologic: Awake alert.  Mildly distracted.  No focal deficits.  Lab Results:  Data Reviewed: I have personally reviewed following labs and imaging studies  CBC: Recent Labs  Lab 10/09/17 0350 10/10/17 0339 10/11/17 0430 10/12/17 0627 10/13/17 0350 10/14/17 0256 10/15/17 0434  WBC 4.3 4.4 5.2 2.1* 5.6 5.7 4.1  NEUTROABS 3.0 3.1 3.7  --   --   --   --   HGB 8.0* 7.7* 7.7*  7.3* 9.0* 9.4* 9.2*  HCT 24.9* 24.2* 24.2* 23.4* 28.0* 29.8* 29.5*  MCV 84.1 86.1 86.1 85.1 84.1 86.6 87.3  PLT 32* 33* 44* 58* 78* 86* 78*    Basic Metabolic Panel: Recent Labs  Lab 10/11/17 0430 10/12/17 0400 10/13/17 0340 10/14/17 0245 10/14/17 0256 10/15/17 0431 10/15/17 0434  NA 132* 131* 135 135  --  135  --   K 4.3 3.9 3.5 3.6  --  4.0  --   CL 95* 94* 97* 97*  --  98*  --   CO2 26 26 29 27   --  28  --   GLUCOSE 65 108* 100* 82  --  82  --   BUN 29* 37* 25* 32*  --  23*  --   CREATININE 4.21* 5.54* 4.41* 5.33*  --  3.95*  --   CALCIUM 8.8* 8.4* 8.3* 8.6*  --  8.4*  --   MG 2.2 2.1 1.9  --  1.9  --   1.9  PHOS 4.3 4.5 3.2 4.0  --  3.1  --     GFR: Estimated Creatinine Clearance: 18.3 mL/min (A) (by C-G formula based on SCr of 3.95 mg/dL (H)).  Liver Function Tests: Recent Labs  Lab 10/11/17 0430 10/12/17 0400 10/13/17 0340 10/14/17 0245 10/15/17 0431  ALBUMIN 2.2* 2.2* 2.2* 2.3* 2.3*    CBG: Recent Labs  Lab 10/14/17 1736 10/14/17 1918 10/14/17 2321 10/15/17 0326 10/15/17 0804  GLUCAP 84 139* 115* 80 74     Recent Results (from the past 240 hour(s))  Urine culture     Status: Abnormal   Collection Time: 10/06/17 11:39 AM  Result Value Ref Range Status   Specimen Description URINE, CATHETERIZED  Final   Special Requests   Final    NONE Performed at [facility], [place] [address], [place], [place] [address]    Culture >=100,000 COLONIES/mL ENTEROBACTER SPECIES (A)  Final   Report Status 10/08/2017 FINAL  Final   Organism ID, Bacteria ENTEROBACTER SPECIES (A)  Final      Susceptibility   Enterobacter species - MIC*    CEFAZOLIN >=64 RESISTANT Resistant     CEFTRIAXONE >=64 RESISTANT Resistant     CIPROFLOXACIN <=0.25 SENSITIVE Sensitive     GENTAMICIN <=1 SENSITIVE Sensitive     IMIPENEM 0.5 SENSITIVE Sensitive     NITROFURANTOIN 32 SENSITIVE Sensitive     TRIMETH/SULFA <=20 SENSITIVE Sensitive     PIP/TAZO >=128 RESISTANT Resistant     * >=100,000 COLONIES/mL ENTEROBACTER SPECIES  Culture, blood (Routine x 2)     Status: None   Collection Time: 10/06/17 11:40 AM  Result Value Ref Range Status   Specimen Description BLOOD RIGHT UPPER ARM  Final   Special Requests   Final    BOTTLES DRAWN AEROBIC AND ANAEROBIC Blood Culture adequate volume   Culture   Final    NO GROWTH 5 DAYS Performed at [facility], [address], [place], [place] [address]    Report Status 10/11/2017 FINAL  Final  Culture, blood (Routine x 2)     Status: None   Collection Time: 10/06/17 11:50 AM  Result Value Ref Range Status   Specimen Description BLOOD LEFT  ANTECUBITAL  Final   Special Requests   Final    BOTTLES DRAWN AEROBIC AND ANAEROBIC Blood Culture results may not be optimal due to an excessive volume of blood received in culture bottles   Culture   Final  NO GROWTH 5 DAYS Performed at [facility], [place] [address], [place], [place] [address]    Report Status 10/11/2017 FINAL  Final  MRSA PCR Screening     Status: Abnormal   Collection Time: 10/09/17 12:20 PM  Result Value Ref Range Status   MRSA by PCR POSITIVE (A) NEGATIVE Final    Comment:        The GeneXpert MRSA Assay (FDA approved for NASAL specimens only), is one component of a comprehensive MRSA colonization surveillance program. It is not intended to diagnose MRSA infection nor to guide or monitor treatment for MRSA infections. RESULT CALLED TO, READ BACK BY AND VERIFIED WITH: RN Leron Croak [phone] MLM Performed at [facility] [address], [place], [place] [address]       Radiology Studies: No results found.   Medications:  Scheduled: . Chlorhexidine Gluconate Cloth  6 each Topical Daily  . darbepoetin (ARANESP) injection - NON-DIALYSIS  200 mcg Subcutaneous Q Mon-1800  . feeding supplement (NEPRO CARB STEADY)  237 mL Oral TID BM  . pantoprazole  40 mg Oral Daily  . sodium chloride flush  10-40 mL Intracatheter Q12H   Continuous: . sodium chloride    . sodium chloride     GGY:IRSWNI chloride, sodium chloride, alteplase, baclofen, heparin, lidocaine (PF), lidocaine-prilocaine, oxyCODONE-acetaminophen, pentafluoroprop-tetrafluoroeth, sodium chloride flush  Assessment/Plan:    Acute metabolic encephalopathy Now resolved.  Most likely due to acute illness.  Avoid sedating medications.     ChronicSystolic CHF  EF 30 to 62%. No significant volume overload on exam.  No ARB or ACE inhibitor due to renal failure.  Home medication list reviewed.  It appears that he was on carvedilol previously.  Holding for now due to his need for  dialysis as well as episodes of bradycardia and frequent pauses.  He was also in hypovolemic shock at the time of admission.  Holding diuretics as well.  Volume being managed by dialysis.  Telemetry shows PVCs.  No significant bradycardia or pauses noted.  Could give carvedilol another attempt.    Hypovolemic shock Resolved  Paroxysmal atrial fibrillation / bradycardia with frequent pauses Off anticoag due to thrombocytopenia.  Continue to monitor on telemetry for now.  He was on apixaban previously.  Acute on CKD stage III/IV ATN in setting of septic shock. Baseline Cr 1.6.  Admission creatinine was 14.4.  Nephrology was consulted.  Patient was initially on CRRT.  Now on intermittent dialysis.  He remains anuric.  Patient was dialyzed on 5/18.  Management per nephrology.  Enterobacter UTI Treated with meropenem.  He completed course of antibiotics on 5/18.  AnemiaofChronicDisease Apparently has had chronically positive fecal occult blood.  He has required blood transfusions during this hospitalization.  Has received 4 units of PRBC so far.  Hemoglobin stable the last few days.  Continue to monitor.    Acute on chronic thrombocytopenia Thrombocytopenia thought to be secondary to sepsis.  TTP thought to be unlikely given negative schistocytes.  DIC panel was equivocal.  Platelet counts remain low.  No evidence for bleeding.  Continue to monitor.    Leg Cramps - RLS Improved on baclofen and heating pads.    History of colon cancer status post colostomy Stable.  DVT Prophylaxis: SCDs    Code Status: Partial code Family Communication: No family at bedside.  Discussed with the patient Disposition Plan: Management as outlined above.  Mobilize as tolerated.  Seen by physical therapy.  They are recommending skilled  nursing facility for rehab.    LOS: 9 days   [facility] Pager [phone] 10/15/2017, 10:15 AM  If 7PM-7AM, please contact night-coverage at  [email or web address], password [facility]

## 2017-10-15 NOTE — Progress Notes (Signed)
Subjective:  -  no UOP still  - - pleasant but still slightly confused - HD yest  -removed 3000, tolerated well   Objective Vital signs in last 24 hours: Vitals:   10/14/17 1921 10/14/17 2000 10/14/17 2323 10/15/17 0326  BP: 130/90  117/60 120/71  Pulse: 81 79 79 87  Resp: 20 (!) 29 (!) 22 (!) 22  Temp: 98.5 F (36.9 C)  98.3 F (36.8 C) 98 F (36.7 C)  TempSrc: Oral  Oral Oral  SpO2: 100% 97% 98% 98%  Weight:    87.2 kg (192 lb 3.9 oz)  Height:       Weight change: 1.5 kg (3 lb 4.9 oz)  Intake/Output Summary (Last 24 hours) at 10/15/2017 0746 Last data filed at 10/14/2017 2000 Gross per 24 hour  Intake 720 ml  Output 3150 ml  Net -2430 ml    Assessment/ Plan: Pt is a 67 y.o. yo male NHP, EF 35% baseline CKD- crt mid 1's who was admitted on 10/06/2017 with urosepsis and AKI  Assessment/Plan: 1. Renal- A on CKD- thought due to ATN/septic shock- req initiation of CRRT on 5/10- stopped on 5/13.   IHD 5/16 and 5/18.  Vascath placed 5/10.  For HD again  tomorrow- will need permcath this week if remains HD requiring.  I have not made plans for HD on the outside for him- was still hoping he will recover  2. Urosepsis- enterobacter- meropenam  3. Anemia- on ESA- no iron due to infection- supportive care, transfused 5/16  4. Elytes- K , phos and Ca OK 5. HTN/volume- has pitting edema but also likely third spacing- not making urine so any removal will need to be via UF- on room air- not responsive to Lasix in past- since essentially no UOP have taken out foley  Lanice Folden A    Labs: Basic Metabolic Panel: Recent Labs  Lab 10/13/17 0340 10/14/17 0245 10/15/17 0431  NA 135 135 135  K 3.5 3.6 4.0  CL 97* 97* 98*  CO2 29 27 28   GLUCOSE 100* 82 82  BUN 25* 32* 23*  CREATININE 4.41* 5.33* 3.95*  CALCIUM 8.3* 8.6* 8.4*  PHOS 3.2 4.0 3.1   Liver Function Tests: Recent Labs  Lab 10/13/17 0340 10/14/17 0245 10/15/17 0431  ALBUMIN 2.2* 2.3* 2.3*   No results for  input(s): LIPASE, AMYLASE in the last 168 hours. No results for input(s): AMMONIA in the last 168 hours. CBC: Recent Labs  Lab 10/09/17 0350 10/10/17 0339 10/11/17 0430 10/12/17 0627 10/13/17 0350 10/14/17 0256 10/15/17 0434  WBC 4.3 4.4 5.2 2.1* 5.6 5.7 4.1  NEUTROABS 3.0 3.1 3.7  --   --   --   --   HGB 8.0* 7.7* 7.7* 7.3* 9.0* 9.4* 9.2*  HCT 24.9* 24.2* 24.2* 23.4* 28.0* 29.8* 29.5*  MCV 84.1 86.1 86.1 85.1 84.1 86.6 87.3  PLT 32* 33* 44* 58* 78* 86* 78*   Cardiac Enzymes: No results for input(s): CKTOTAL, CKMB, CKMBINDEX, TROPONINI in the last 168 hours. CBG: Recent Labs  Lab 10/14/17 1639 10/14/17 1736 10/14/17 1918 10/14/17 2321 10/15/17 0326  GLUCAP 79 84 139* 115* 80    Iron Studies:  No results for input(s): IRON, TIBC, TRANSFERRIN, FERRITIN in the last 72 hours. Studies/Results: No results found. Medications: Infusions: . sodium chloride    . sodium chloride      Scheduled Medications: . Chlorhexidine Gluconate Cloth  6 each Topical Daily  . darbepoetin (ARANESP) injection - NON-DIALYSIS  200 mcg Subcutaneous  Q Mon-1800  . feeding supplement (NEPRO CARB STEADY)  237 mL Oral TID BM  . pantoprazole  40 mg Oral Daily  . sodium chloride flush  10-40 mL Intracatheter Q12H    have reviewed scheduled and prn medications.  Physical Exam: General: alert, slightly confused Heart: RRR Lungs: mostly clear Abdomen: soft, non tender- ostomy  Extremities: pitting dep edema Dialysis Access: left IJ vascath placed 5/10    10/15/2017,7:46 AM  LOS: 9 days

## 2017-10-15 NOTE — NC FL2 (Signed)
[place] LEVEL OF CARE SCREENING TOOL     IDENTIFICATION  Patient Name: Patrick [name] Birthdate: Dec 26, 1950 Sex: male Admission Date (Current Location): 10/06/2017  [place] and [place] Number:  [name] [id number] [place] and Address:  [facility] [place], [place] [address], [place], [place] [address]      Provider Number: [id number]  Attending Physician Name and Address:  Bonnielee Haff, MD  Relative Name and Phone Number:  Mickel Schreur, [phone]    Current Level of Care: SNF Recommended Level of Care: [place] Prior Approval Number:    Date Approved/Denied:   PASRR Number:    Discharge Plan: SNF    Current Diagnoses: Patient Active Problem List   Diagnosis Date Noted  . Shock circulatory ([place]) 10/06/2017  . Acute renal disease   . Compromised airway   . Acute respiratory failure with hypoxemia ([place])   . Community acquired bacterial pneumonia 05/24/2017  . Renal cell carcinoma, left ([place]) 05/24/2017  . Liver cirrhosis ([place]) 05/24/2017  . Pneumonia 05/23/2017  . Balance problem 03/07/2017  . Pulmonary HTN ([place]) 11/10/2016  . History of resection of rectum 09/28/2016  . Rectal cancer ([place]) 09/28/2016  . Atrial fibrillation ([place]) 09/11/2016  . Coronary artery disease involving native coronary artery of native heart with angina pectoris ([place]) 09/11/2016  . Nonischemic cardiomyopathy ([place]) 09/11/2016  . Thrombocytopenia ([place]) 05/29/2016  . Adenocarcinoma of rectum s/p robotic APR/colostomy 09/28/2016 04/14/2016  . Chronic kidney disease (CKD), stage III (moderate) ([place]) 02/05/2016  . Abnormal finding on cardiovascular stress test 02/05/2016  . Cardiomyopathy, ischemic 06/17/2015  . Acute on chronic clinical systolic heart failure ([place]) 06/17/2015  . Major depressive disorder, recurrent episode ([place]) 04/17/2015  . Hypokalemia 08/29/2014  . Other iron deficiency anemias   . Elevated troponin I measurement   . Encounter for  central line placement 03/11/2014  . Obesity, unspecified 10/03/2013  . Dyslipidemia 08/13/2013  . MACULAR DEGENERATION 05/02/2008  . ERECTILE DYSFUNCTION 03/13/2007  . Essential hypertension 01/16/2007  . BPH (benign prostatic hyperplasia) 01/16/2007    Orientation RESPIRATION BLADDER Height & Weight     Self, Time, Situation, Place  Normal Incontinent Weight: 192 lb 3.9 oz (87.2 kg)(Scale A) Height:  5\' 6"  (167.6 cm)  BEHAVIORAL SYMPTOMS/MOOD NEUROLOGICAL BOWEL NUTRITION STATUS      Incontinent, Colostomy(placed 5/02, permanent) Diet(Heart healthy, thin liquids)  AMBULATORY STATUS COMMUNICATION OF NEEDS Skin   Extensive Assist Verbally Normal                       Personal Care Assistance Level of Assistance  Bathing, Feeding, Dressing Bathing Assistance: Maximum assistance Feeding assistance: Independent Dressing Assistance: Maximum assistance     Functional Limitations Info  Sight, Hearing, Speech Sight Info: Adequate Hearing Info: Adequate Speech Info: Adequate    SPECIAL CARE FACTORS FREQUENCY  PT (By licensed PT), OT (By licensed OT)     PT Frequency: 2x OT Frequency: 2x            Contractures Contractures Info: Not present    Additional Factors Info  Code Status, Allergies Code Status Info: Partial Allergies Info: Nsaids           Current Medications (10/15/2017):  This is the current hospital active medication list Current Facility-Administered Medications  Medication Dose Route Frequency Provider Last Rate Last Dose  . 0.9 %  sodium chloride infusion  100 mL Intravenous PRN Corliss Parish, MD      . 0.9 %  sodium chloride infusion  100 mL Intravenous PRN Corliss Parish, MD      . alteplase (CATHFLO ACTIVASE) injection 2 mg  2 mg Intracatheter Once PRN Corliss Parish, MD      . baclofen (LIORESAL) tablet 5 mg  5 mg Oral TID PRN Allie Bossier, MD   5 mg at 10/15/17 0140  . Chlorhexidine Gluconate Cloth 2 % PADS 6 each  6  each Topical Daily Allie Bossier, MD   6 each at 10/14/17 2300  . Darbepoetin Alfa (ARANESP) injection 200 mcg  200 mcg Subcutaneous Q Mon-1800 Allie Bossier, MD   200 mcg at 10/09/17 1903  . feeding supplement (NEPRO CARB STEADY) liquid 237 mL  237 mL Oral TID BM Allie Bossier, MD   237 mL at 10/14/17 2136  . heparin injection 1,000 Units  1,000 Units Dialysis PRN Corliss Parish, MD      . lidocaine (PF) (XYLOCAINE) 1 % injection 5 mL  5 mL Intradermal PRN Corliss Parish, MD      . lidocaine-prilocaine (EMLA) cream 1 application  1 application Topical PRN Corliss Parish, MD      . oxyCODONE-acetaminophen (PERCOCET/ROXICET) 5-325 MG per tablet 1 tablet  1 tablet Oral Q8H PRN Allie Bossier, MD   1 tablet at 10/15/17 0140  . pantoprazole (PROTONIX) EC tablet 40 mg  40 mg Oral Daily Allie Bossier, MD   40 mg at 10/14/17 9735  . pentafluoroprop-tetrafluoroeth (GEBAUERS) aerosol 1 application  1 application Topical PRN Corliss Parish, MD      . sodium chloride flush (NS) 0.9 % injection 10-40 mL  10-40 mL Intracatheter Q12H Allie Bossier, MD   10 mL at 10/14/17 2134  . sodium chloride flush (NS) 0.9 % injection 10-40 mL  10-40 mL Intracatheter PRN Allie Bossier, MD         Discharge Medications: Please see discharge summary for a list of discharge medications.  Relevant Imaging Results:  Relevant Lab Results:   Additional Information SS# [id number]  Eileen Stanford, LCSW

## 2017-10-15 NOTE — Clinical Social Work Note (Signed)
Clinical Social Work Assessment  Patient Details  Name: Patrick [name] MRN: [id number] Date of Birth: May 24, 1951  Date of referral:  10/15/17               Reason for consult:  Facility Placement                Permission sought to share information with:  Family Supports Permission granted to share information::  Yes, Release of Information Signed  Name::     [place]  Agency::  [facility]  Relationship::  Sister  Contact Information:  [phone]  Housing/Transportation Living arrangements for the past 2 months:  Skilled Nursing Facility(Long term ) Source of Information:  Patient Patient Interpreter Needed:  None Criminal Activity/Legal Involvement Pertinent to Current Situation/Hospitalization:  No - Comment as needed Significant Relationships:  Siblings Lives with:  Self Do you feel safe going back to the place where you live?  Yes Need for family participation in patient care:  No (Coment)  Care giving concerns:  Pt is alert and oriented. Pt is a LTC resident at [facility].    Social Worker assessment / plan:  Pt is a LTC resident at [facility]. Pt's plan is to return at d/c. CSW reached out to facility to determine any barriers to pt returning at d/c--left voicemail. CSW will continue to follow.  Employment status:    Insurance informationEducational psychologist PT Recommendations:  [place] / Referral to community resources:  [place]  Patient/Family's Response to care:  Pt verbalized understanding of CSW role and expressed appreciation for support. Pt denies any concern regarding pt care at this time.   Patient/Family's Understanding of and Emotional Response to Diagnosis, Current Treatment, and Prognosis:  Pt understanding and realistic regarding physical limitations. Pt understands the need for SNF placement at d/c. Pt agreeable to SNF placement at d/c, at this time. Pt's responses emotionally appropriate during conversation with  CSW. Pt denies any concern regarding treatment plan at this time. CSW will continue to provide support and facilitate d/c needs.   Emotional Assessment Appearance:  Appears stated age Attitude/Demeanor/Rapport:  Engaged Affect (typically observed):  Accepting, Appropriate, Calm Orientation:  Oriented to Self, Oriented to Place, Oriented to  Time, Oriented to Situation Alcohol / Substance use:  Not Applicable Psych involvement (Current and /or in the community):  No (Comment)  Discharge Needs  Concerns to be addressed:  Care Coordination, Basic Needs Readmission within the last 30 days:  No Current discharge risk:  Dependent with Mobility Barriers to Discharge:  Continued Medical Work up   [employer], LCSW 10/15/2017, 9:37 AM

## 2017-10-16 DIAGNOSIS — D638 Anemia in other chronic diseases classified elsewhere: Secondary | ICD-10-CM

## 2017-10-16 LAB — RENAL FUNCTION PANEL
Albumin: 2.3 g/dL — ABNORMAL LOW (ref 3.5–5.0)
Anion gap: 12 (ref 5–15)
BUN: 31 mg/dL — ABNORMAL HIGH (ref 6–20)
CALCIUM: 8.8 mg/dL — AB (ref 8.9–10.3)
CO2: 27 mmol/L (ref 22–32)
Chloride: 97 mmol/L — ABNORMAL LOW (ref 101–111)
Creatinine, Ser: 4.74 mg/dL — ABNORMAL HIGH (ref 0.61–1.24)
GFR, EST AFRICAN AMERICAN: 13 mL/min — AB (ref >60)
GFR, EST NON AFRICAN AMERICAN: 11 mL/min — AB (ref >60)
Glucose, Bld: 90 mg/dL (ref 65–99)
Phosphorus: 3.6 mg/dL (ref 2.5–4.6)
Potassium: 4.3 mmol/L (ref 3.5–5.1)
SODIUM: 136 mmol/L (ref 135–145)

## 2017-10-16 LAB — GLUCOSE, CAPILLARY
GLUCOSE-CAPILLARY: 73 mg/dL (ref 65–99)
GLUCOSE-CAPILLARY: 87 mg/dL (ref 65–99)
GLUCOSE-CAPILLARY: 81 mg/dL (ref 65–99)

## 2017-10-16 LAB — CBC
HCT: 29.1 % — ABNORMAL LOW (ref 39.0–52.0)
HEMOGLOBIN: 9.3 g/dL — AB (ref 13.0–17.0)
MCH: 28 pg (ref 26.0–34.0)
MCHC: 32 g/dL (ref 30.0–36.0)
MCV: 87.7 fL (ref 78.0–100.0)
Platelets: 94 10*3/uL — ABNORMAL LOW (ref 150–400)
RBC: 3.32 MIL/uL — AB (ref 4.22–5.81)
RDW: 21.1 % — ABNORMAL HIGH (ref 11.5–15.5)
WBC: 4.2 10*3/uL (ref 4.0–10.5)

## 2017-10-16 LAB — MAGNESIUM: MAGNESIUM: 1.9 mg/dL (ref 1.7–2.4)

## 2017-10-16 MED ORDER — HEPARIN SODIUM (PORCINE) 1000 UNIT/ML DIALYSIS: 20.0000 [IU]/kg | INTRAMUSCULAR | Status: DC | PRN

## 2017-10-16 NOTE — Progress Notes (Signed)
[facility] PROGRESS NOTE  Patrick [name] XKG:[id number] DOB: Jul 18, 1950 DOA: 10/06/2017  PCP: Patrick [name], [name]  Brief History/Interval Summary: 67 yo M SNF resident w/ a Hx CAD, Chronic Systolic CHF (EF 70-26%), remote Colon Cancer w/ perm end colostomy in 2018, CKD Stage III-IV, SBO, and an enlarged prostate who presented to the ED 5/10 after being found w/ altered LOC and several lab abnormalities including cr 13.35 and K 5.35m Hgb 7.5, and hypoglycemia.  He was also hypotensive w/ SBP in 70s.  He initially underwent CRRT.  Now getting intermittent dialysis.  Significant Events: 5/10 admit - transfused 1U PRBC - Vascath placed - CRRT initiated 5/13 CRRT stopped  5/11 TTE - diffuse hypokinesis - EF 35-40% - mild mitral regurg 5/16 HD  Consultants:  PCCM Nephrology  Antimicrobials:  Zosyn 5/10 > 5/11 Meropenem 5/12 >5/18  Procedures:   Transthoracic echocardiogram Study Conclusions  - Left ventricle: Diffuse hypokineis worse in inferior wall with   abnromal septal motion Wall thickness was increased in a pattern   of mild LVH. Systolic function was moderately reduced. The   estimated ejection fraction was in the range of 35% to 40%. The   study is not technically sufficient to allow evaluation of LV   diastolic function. - Mitral valve: There was mild regurgitation. - Left atrium: The atrium was severely dilated. - Right ventricle: The cavity size was moderately dilated. - Right atrium: The atrium was moderately dilated. - Atrial septum: No defect or patent foramen ovale was identified. - Tricuspid valve: There was moderate regurgitation.  Subjective/Interval History: Patrick [name] states that he feels well.  However he does mention that he is a little short of breath this morning.  Denies any cough.  No chest pain.  Still no urine.    ROS: Denies any headaches.  Objective:  Vital Signs  Vitals:   10/15/17 1648 10/15/17 1921 10/15/17 2319 10/16/17 0319  BP:  129/75 133/87 129/62 101/80  Pulse:  81    Resp: 19 14 15 16   Temp: 98.4 F (36.9 C) 98.1 F (36.7 C) 98 F (36.7 C) 98.7 F (37.1 C)  TempSrc: Oral Oral Oral Oral  SpO2:  100% 100% 98%  Weight:    88.3 kg (194 lb 10.7 oz)  Height:        Intake/Output Summary (Last 24 hours) at 10/16/2017 1036 Last data filed at 10/16/2017 0939 Gross per 24 hour  Intake -  Output 525 ml  Net -525 ml   Filed Weights   10/14/17 1653 10/15/17 0326 10/16/17 0319  Weight: 87.3 kg (192 lb 7.4 oz) 87.2 kg (192 lb 3.9 oz) 88.3 kg (194 lb 10.7 oz)    General appearance: Awake alert.  In no distress Resp: Normal effort at rest.  Diminished air entry at the bases but no wheezing or rhonchi appreciated.  Few crackles present. Cardio: S1-S2 is normal regular.  Systolic murmur appreciated over the precordium.  No S3 or S4.  No rubs or bruit GI: Abdomen is soft.  Colostomy bag is noted with brown stool.  Nontender not distended.   Extremities: bilateral lower extremity edema is present. Neurologic: No focal deficits.  Lab Results:  Data Reviewed: I have personally reviewed following labs and imaging studies  CBC: Recent Labs  Lab 10/10/17 0339 10/11/17 0430 10/12/17 0627 10/13/17 0350 10/14/17 0256 10/15/17 0434 10/16/17 0416  WBC 4.4 5.2 2.1* 5.6 5.7 4.1 4.2  NEUTROABS 3.1 3.7  --   --   --   --   --  HGB 7.7* 7.7* 7.3* 9.0* 9.4* 9.2* 9.3*  HCT 24.2* 24.2* 23.4* 28.0* 29.8* 29.5* 29.1*  MCV 86.1 86.1 85.1 84.1 86.6 87.3 87.7  PLT 33* 44* 58* 78* 86* 78* 94*    Basic Metabolic Panel: Recent Labs  Lab 10/12/17 0400 10/13/17 0340 10/14/17 0245 10/14/17 0256 10/15/17 0431 10/15/17 0434 10/16/17 0416  NA 131* 135 135  --  135  --  136  K 3.9 3.5 3.6  --  4.0  --  4.3  CL 94* 97* 97*  --  98*  --  97*  CO2 26 29 27   --  28  --  27  GLUCOSE 108* 100* 82  --  82  --  90  BUN 37* 25* 32*  --  23*  --  31*  CREATININE 5.54* 4.41* 5.33*  --  3.95*  --  4.74*  CALCIUM 8.4* 8.3* 8.6*  --   8.4*  --  8.8*  MG 2.1 1.9  --  1.9  --  1.9 1.9  PHOS 4.5 3.2 4.0  --  3.1  --  3.6    GFR: Estimated Creatinine Clearance: 15.3 mL/min (A) (by C-G formula based on SCr of 4.74 mg/dL (H)).  Liver Function Tests: Recent Labs  Lab 10/12/17 0400 10/13/17 0340 10/14/17 0245 10/15/17 0431 10/16/17 0416  ALBUMIN 2.2* 2.2* 2.3* 2.3* 2.3*    CBG: Recent Labs  Lab 10/15/17 1233 10/15/17 1650 10/15/17 1954 10/15/17 2340 10/16/17 0318  GLUCAP 95 103* 89 105* 73     Recent Results (from the past 240 hour(s))  Urine culture     Status: Abnormal   Collection Time: 10/06/17 11:39 AM  Result Value Ref Range Status   Specimen Description URINE, CATHETERIZED  Final   Special Requests   Final    NONE Performed at [facility], [place] [address] [place], [place] [address]    Culture >=100,000 COLONIES/mL ENTEROBACTER SPECIES (A)  Final   Report Status 10/08/2017 FINAL  Final   Organism ID, Bacteria ENTEROBACTER SPECIES (A)  Final      Susceptibility   Enterobacter species - MIC*    CEFAZOLIN >=64 RESISTANT Resistant     CEFTRIAXONE >=64 RESISTANT Resistant     CIPROFLOXACIN <=0.25 SENSITIVE Sensitive     GENTAMICIN <=1 SENSITIVE Sensitive     IMIPENEM 0.5 SENSITIVE Sensitive     NITROFURANTOIN 32 SENSITIVE Sensitive     TRIMETH/SULFA <=20 SENSITIVE Sensitive     PIP/TAZO >=128 RESISTANT Resistant     * >=100,000 COLONIES/mL ENTEROBACTER SPECIES  Culture, blood (Routine x 2)     Status: None   Collection Time: 10/06/17 11:40 AM  Result Value Ref Range Status   Specimen Description BLOOD RIGHT UPPER ARM  Final   Special Requests   Final    BOTTLES DRAWN AEROBIC AND ANAEROBIC Blood Culture adequate volume   Culture   Final    NO GROWTH 5 DAYS Performed at [facility], [address], [place], [place] [address]    Report Status 10/11/2017 FINAL  Final  Culture, blood (Routine x 2)     Status: None   Collection Time: 10/06/17 11:50 AM  Result Value Ref Range  Status   Specimen Description BLOOD LEFT ANTECUBITAL  Final   Special Requests   Final    BOTTLES DRAWN AEROBIC AND ANAEROBIC Blood Culture results may not be optimal due to an excessive volume of blood received in culture bottles   Culture  Final    NO GROWTH 5 DAYS Performed at [facility], [place] [address] [place], [place] [address]    Report Status 10/11/2017 FINAL  Final  MRSA PCR Screening     Status: Abnormal   Collection Time: 10/09/17 12:20 PM  Result Value Ref Range Status   MRSA by PCR POSITIVE (A) NEGATIVE Final    Comment:        The GeneXpert MRSA Assay (FDA approved for NASAL specimens only), is one component of a comprehensive MRSA colonization surveillance program. It is not intended to diagnose MRSA infection nor to guide or monitor treatment for MRSA infections. RESULT CALLED TO, READ BACK BY AND VERIFIED WITH: RN Leron Croak [id number] MLM Performed at [facility] [address], [place], [place] [address]       Radiology Studies: No results found.   Medications:  Scheduled: . Chlorhexidine Gluconate Cloth  6 each Topical Daily  . darbepoetin (ARANESP) injection - NON-DIALYSIS  200 mcg Subcutaneous Q Mon-1800  . feeding supplement (NEPRO CARB STEADY)  237 mL Oral TID BM  . pantoprazole  40 mg Oral Daily  . sodium chloride flush  10-40 mL Intracatheter Q12H   Continuous: . sodium chloride    . sodium chloride     MVH:QIONGE chloride, sodium chloride, alteplase, baclofen, heparin, lidocaine (PF), lidocaine-prilocaine, oxyCODONE-acetaminophen, pentafluoroprop-tetrafluoroeth, sodium chloride flush  Assessment/Plan:    Acute on CKD stage III/IV ATN in setting of septic shock. Baseline Cr 1.6.  Admission creatinine was 14.4.  Nephrology was consulted.  Patrick [name] was initially on CRRT.  Now on intermittent dialysis.  He remains anuric.  Patrick [name] was dialyzed on 5/18.  Management per nephrology.  Acute metabolic encephalopathy Most likely  due to acute illness.  Now resolved.   ChronicSystolic CHF  EF 30 to 95%. No significant volume overload on exam.  No ARB or ACE inhibitor due to renal failure.  Home medication list reviewed.  It appears that he was on carvedilol previously.  Holding for now due to his need for dialysis as well as episodes of bradycardia and frequent pauses.  He was also in hypovolemic shock at the time of admission.  Holding diuretics as well.  Volume being managed by dialysis.  Telemetry shows PVCs.  No significant bradycardia or pauses noted.  Seems to be stable.  Outpatient follow-up with cardiology.  Hypovolemic shock Resolved  Paroxysmal atrial fibrillation / bradycardia with frequent pauses Off anticoag due to thrombocytopenia.  Continue to monitor on telemetry for now.  He was on apixaban previously.  Enterobacter UTI Treated with meropenem.  He completed course of antibiotics on 5/18.  AnemiaofChronicDisease Apparently has had chronically positive fecal occult blood.  He has required blood transfusions during this hospitalization.  Has received 4 units of PRBC so far.  Hemoglobin stable the last few days.  Continue to monitor.  No evidence for any overt bleeding.  Acute on chronic thrombocytopenia Thrombocytopenia thought to be secondary to sepsis.  TTP thought to be unlikely given negative schistocytes.  DIC panel was equivocal.  Platelet counts remain low though slightly improved this morning.  No need to check this on a daily basis.  No evidence for bleeding.    Leg Cramps - RLS Improved on baclofen and heating pads.    History of colon cancer status post colostomy Stable.  DVT Prophylaxis: SCDs    Code Status: Partial code Family Communication: No family at bedside.  Discussed with the Patrick [name] Disposition Plan: Management as  outlined above.  Still waiting on improvement in renal function although he continues to be anuric.  Nephrology is following.  Will likely need some form of  rehab at the time of discharge.    LOS: 10 days   [facility] Pager [phone] 10/16/2017, 10:36 AM  If 7PM-7AM, please contact night-coverage at [email or web address], password [facility]

## 2017-10-16 NOTE — Progress Notes (Signed)
[facility] Rounding Note  Background 67 y.o. yo male NHP, wheelchair bound, PMH CAD, HTN, HLD, BPH, CM with EF 35%, RV and LV dysfunction,  baseline CKD3 (creatinine 1.48 - 1.78 and on losartan outpt). Admitted on 10/06/2017 with creatinine 14, AMS ->urosepsis and AKI (ATN felt 2/2 septic shock). CRRT 5/10-5/13 ([place] cath 5/10) then transitioned to IHD 5/16, dialysis dependent since. Renal US 5/10 12.1, 12.4 echodense. Of note pt had chronic indwelling foley since May 2018 due to inability to empty bladder, removed this admission  Assessment/Plan: 1. AKI on CKD3 - 2/2 ATN/septic shock from enterobacter urosepsis. CRRT 5/10-5/13, IHD 5/16, 5/18 and scheduled for today 5/20. He will need permcath this week if remains HD requiring. We have not made plans for HD on the outside for him as still hoping for some recovery. Of note foley out for a week, tells me had indwelling foley since May 2018 prior to current admission for inability to pass urine on his own (sounds like BPH). Will initiate daily bladder scans as this issue could mask renal recovery. Too early to call permanent dialysis dependence. 2. Urosepsis- enterobacter- meropenam  3. Anemia- on ESA- no iron due to infection- supportive care, transfused 5/16    Subjective: Still no urine output recorded at all Foley was removed over a week ago Says had foley for 1 year prior to current admit d/t inability to empty bladder Denies pain or SOB  Objective Vital signs in last 24 hours: Vitals:   10/15/17 1648 10/15/17 1921 10/15/17 2319 10/16/17 0319  BP: 129/75 133/87 129/62 101/80  Pulse:  81    Resp: 19 14 15 16   Temp: 98.4 F (36.9 C) 98.1 F (36.7 C) 98 F (36.7 C) 98.7 F (37.1 C)  TempSrc: Oral Oral Oral Oral  SpO2:  100% 100% 98%  Weight:    88.3 kg (194 lb 10.7 oz)  Height:       Weight change: -2.2 kg (-4 lb 13.6 oz)  Intake/Output Summary (Last 24 hours) at 10/16/2017 0903 Last data filed at 10/16/2017 0500 Gross per 24 hour  Intake  -  Output 325 ml  Net -325 ml   Physical Exam: Very nice older man Sitting on edge of bed eating breakfast Lungs with decr BS bases and some rales S1S2 No S3 Abd soft, cannot really palpate bladder 2+ dependent pitting edema LE's L IJ vas cath in place (5/10)   Recent Labs  Lab 10/14/17 0245 10/15/17 0431 10/16/17 0416  NA 135 135 136  K 3.6 4.0 4.3  CL 97* 98* 97*  CO2 27 28 27   GLUCOSE 82 82 90  BUN 32* 23* 31*  CREATININE 5.33* 3.95* 4.74*  CALCIUM 8.6* 8.4* 8.8*  PHOS 4.0 3.1 3.6    Recent Labs  Lab 10/14/17 0245 10/15/17 0431 10/16/17 0416  ALBUMIN 2.3* 2.3* 2.3*    Recent Labs  Lab 10/10/17 0339 10/11/17 0430 10/12/17 0627 10/13/17 0350 10/14/17 0256 10/15/17 0434 10/16/17 0416  WBC 4.4 5.2 2.1* 5.6 5.7 4.1 4.2  NEUTROABS 3.1 3.7  --   --   --   --   --   HGB 7.7* 7.7* 7.3* 9.0* 9.4* 9.2* 9.3*  HCT 24.2* 24.2* 23.4* 28.0* 29.8* 29.5* 29.1*  MCV 86.1 86.1 85.1 84.1 86.6 87.3 87.7  PLT 33* 44* 58* 78* 86* 78* 94*   CBG: Recent Labs  Lab 10/15/17 1233 10/15/17 1650 10/15/17 1954 10/15/17 2340 10/16/17 0318  GLUCAP 95 103* 89 105* 73  Scheduled Medications: . Chlorhexidine Gluconate Cloth  6 each Topical Daily  . darbepoetin (ARANESP) injection - NON-DIALYSIS  200 mcg Subcutaneous Q Mon-1800  . feeding supplement (NEPRO CARB STEADY)  237 mL Oral TID BM  . pantoprazole  40 mg Oral Daily  . sodium chloride flush  10-40 mL Intracatheter Q12H   Jamal Maes, MD [place] Pager 10/16/2017, 9:16 AM

## 2017-10-17 LAB — CBC
HEMATOCRIT: 29 % — AB (ref 39.0–52.0)
HEMOGLOBIN: 9.2 g/dL — AB (ref 13.0–17.0)
MCH: 27.7 pg (ref 26.0–34.0)
MCHC: 31.7 g/dL (ref 30.0–36.0)
MCV: 87.3 fL (ref 78.0–100.0)
Platelets: 58 10*3/uL — ABNORMAL LOW (ref 150–400)
RBC: 3.32 MIL/uL — ABNORMAL LOW (ref 4.22–5.81)
RDW: 21.4 % — AB (ref 11.5–15.5)
WBC: 4.3 10*3/uL (ref 4.0–10.5)

## 2017-10-17 LAB — PROTIME-INR
INR: 1.47
Prothrombin Time: 17.7 seconds — ABNORMAL HIGH (ref 11.4–15.2)

## 2017-10-17 LAB — RENAL FUNCTION PANEL
ALBUMIN: 2.3 g/dL — AB (ref 3.5–5.0)
ANION GAP: 10 (ref 5–15)
BUN: 18 mg/dL (ref 6–20)
CO2: 28 mmol/L (ref 22–32)
Calcium: 8.6 mg/dL — ABNORMAL LOW (ref 8.9–10.3)
Chloride: 98 mmol/L — ABNORMAL LOW (ref 101–111)
Creatinine, Ser: 3.42 mg/dL — ABNORMAL HIGH (ref 0.61–1.24)
GFR calc Af Amer: 20 mL/min — ABNORMAL LOW (ref >60)
GFR, EST NON AFRICAN AMERICAN: 17 mL/min — AB (ref >60)
Glucose, Bld: 82 mg/dL (ref 65–99)
PHOSPHORUS: 2.8 mg/dL (ref 2.5–4.6)
POTASSIUM: 3.9 mmol/L (ref 3.5–5.1)
Sodium: 136 mmol/L (ref 135–145)

## 2017-10-17 LAB — MAGNESIUM: MAGNESIUM: 1.7 mg/dL (ref 1.7–2.4)

## 2017-10-17 MED ORDER — SODIUM CHLORIDE 0.9% FLUSH: 3.0000 mL | INTRAVENOUS | Status: DC | PRN

## 2017-10-17 MED ORDER — SODIUM CHLORIDE 0.9% FLUSH
3.0000 mL | Freq: Two times a day (BID) | INTRAVENOUS | Status: DC
  Administered 2017-10-18 – 2017-10-23 (×6): 3 mL via INTRAVENOUS

## 2017-10-17 MED ORDER — SODIUM CHLORIDE 0.9 % IV SOLN: 250.0000 mL | INTRAVENOUS | Status: DC | PRN

## 2017-10-17 MED ORDER — CEFAZOLIN SODIUM-DEXTROSE 2-4 GM/100ML-% IV SOLN
2.0000 g | INTRAVENOUS | Status: AC
  Administered 2017-10-18: 2 g via INTRAVENOUS
  Filled 2017-10-17: qty 100

## 2017-10-17 NOTE — Progress Notes (Addendum)
[facility] PROGRESS NOTE  DAKODA LAVENTURE ZJI:[id number] DOB: 1951/04/06 DOA: 10/06/2017  PCP: Patient, [name]  Brief History/Interval Summary: 67 yo M SNF resident w/ a Hx CAD, Chronic Systolic CHF (EF 17-51%), remote Colon Cancer w/ perm end colostomy in 2018, CKD Stage III-IV, SBO, and an enlarged prostate who presented to the ED 5/10 after being found w/ altered LOC and several lab abnormalities including cr 13.35 and K 5.84m Hgb 7.5, and hypoglycemia.  He was also hypotensive w/ SBP in 70s.  He initially underwent CRRT.  Now getting intermittent dialysis.  Significant Events: 5/10 admit - transfused 1U PRBC - Vascath placed - CRRT initiated 5/13 CRRT stopped  5/11 TTE - diffuse hypokinesis - EF 35-40% - mild mitral regurg 5/16 IHD  Consultants: PCCM Nephrology  Antimicrobials: Zosyn 5/10 > 5/11 Meropenem 5/12 >5/18  Procedures:   Transthoracic echocardiogram Study Conclusions  - Left ventricle: Diffuse hypokineis worse in inferior wall with abnromal septal motion Wall thickness was increased in a pattern of mild LVH. Systolic function was moderately reduced. The estimated ejection fraction was in the range of 35% to 40%. The study is not technically sufficient to allow evaluation of LV diastolic function. - Mitral valve: There was mild regurgitation. - Left atrium: The atrium was severely dilated. - Right ventricle: The cavity size was moderately dilated. - Right atrium: The atrium was moderately dilated. - Atrial septum: No defect or patent foramen ovale was identified. - Tricuspid valve: There was moderate regurgitation.    Subjective/Interval History: Patient states that he feels well.  Shortness of breath has improved.  He was dialyzed yesterday.  Denies any chest pain.  ROS: No nausea or vomiting  Objective:  Vital Signs  Vitals:   10/16/17 1948 10/16/17 2306 10/17/17 0300 10/17/17 0706  BP: 116/77 118/75 136/76 130/86  Pulse:  76 84 89 100  Resp: 15 20 19 14   Temp: 97.9 F (36.6 C) 99.4 F (37.4 C) 97.8 F (36.6 C) 98.5 F (36.9 C)  TempSrc: Oral Oral Oral Oral  SpO2: 94% 95% 98% 93%  Weight:   85.9 kg (189 lb 6 oz)   Height:        Intake/Output Summary (Last 24 hours) at 10/17/2017 1019 Last data filed at 10/17/2017 0948 Gross per 24 hour  Intake 780 ml  Output 3400 ml  Net -2620 ml   Filed Weights   10/16/17 1528 10/16/17 1900 10/17/17 0300  Weight: 90.1 kg (198 lb 10.2 oz) 87.5 kg (192 lb 14.4 oz) 85.9 kg (189 lb 6 oz)    General appearance: alert, cooperative, appears stated age and no distress Resp: clear to auscultation bilaterally Cardio: regular rate and rhythm, S1, S2 normal, no murmur, click, rub or gallop GI: soft, non-tender; bowel sounds normal; no masses,  no organomegaly Extremities: extremities normal, atraumatic, no cyanosis or edema Neurologic: No focal neurological deficits.  He seems mildly distracted.  Lab Results:  Data Reviewed: I have personally reviewed following labs and imaging studies  CBC: Recent Labs  Lab 10/11/17 0430  10/13/17 0350 10/14/17 0256 10/15/17 0434 10/16/17 0416 10/17/17 0310  WBC 5.2   < > 5.6 5.7 4.1 4.2 4.3  NEUTROABS 3.7  --   --   --   --   --   --   HGB 7.7*   < > 9.0* 9.4* 9.2* 9.3* 9.2*  HCT 24.2*   < > 28.0* 29.8* 29.5* 29.1* 29.0*  MCV 86.1   < > 84.1 86.6  87.3 87.7 87.3  PLT 44*   < > 78* 86* 78* 94* 58*   < > = values in this interval not displayed.    Basic Metabolic Panel: Recent Labs  Lab 10/13/17 0340 10/14/17 0245 10/14/17 0256 10/15/17 0431 10/15/17 0434 10/16/17 0416 10/17/17 0310  NA 135 135  --  135  --  136 136  K 3.5 3.6  --  4.0  --  4.3 3.9  CL 97* 97*  --  98*  --  97* 98*  CO2 29 27  --  28  --  27 28  GLUCOSE 100* 82  --  82  --  90 82  BUN 25* 32*  --  23*  --  31* 18  CREATININE 4.41* 5.33*  --  3.95*  --  4.74* 3.42*  CALCIUM 8.3* 8.6*  --  8.4*  --  8.8* 8.6*  MG 1.9  --  1.9  --  1.9 1.9 1.7    PHOS 3.2 4.0  --  3.1  --  3.6 2.8    GFR: Estimated Creatinine Clearance: 20.9 mL/min (A) (by C-G formula based on SCr of 3.42 mg/dL (H)).  Liver Function Tests: Recent Labs  Lab 10/13/17 0340 10/14/17 0245 10/15/17 0431 10/16/17 0416 10/17/17 0310  ALBUMIN 2.2* 2.3* 2.3* 2.3* 2.3*    CBG: Recent Labs  Lab 10/15/17 1954 10/15/17 2340 10/16/17 0318 10/16/17 2013 10/16/17 2306  GLUCAP 89 105* 73 87 81     Recent Results (from the past 240 hour(s))  MRSA PCR Screening     Status: Abnormal   Collection Time: 10/09/17 12:20 PM  Result Value Ref Range Status   MRSA by PCR POSITIVE (A) NEGATIVE Final    Comment:        The GeneXpert MRSA Assay (FDA approved for NASAL specimens only), is one component of a comprehensive MRSA colonization surveillance program. It is not intended to diagnose MRSA infection nor to guide or monitor treatment for MRSA infections. RESULT CALLED TO, READ BACK BY AND VERIFIED WITH: RN Leron Croak [phone] MLM Performed at [facility] [address], [place], [place] [address]       Radiology Studies: No results found.   Medications:  Scheduled: . Chlorhexidine Gluconate Cloth  6 each Topical Daily  . darbepoetin (ARANESP) injection - NON-DIALYSIS  200 mcg Subcutaneous Q Mon-1800  . feeding supplement (NEPRO CARB STEADY)  237 mL Oral TID BM  . pantoprazole  40 mg Oral Daily  . sodium chloride flush  10-40 mL Intracatheter Q12H   Continuous: . sodium chloride    . sodium chloride     FAO:ZHYQMV chloride, sodium chloride, alteplase, baclofen, heparin, heparin, lidocaine (PF), lidocaine-prilocaine, oxyCODONE-acetaminophen, pentafluoroprop-tetrafluoroeth, sodium chloride flush  Assessment/Plan:   Acute on CKD stage III/IV ATN in setting of septic shock. Baseline Cr 1.6. ?Admission creatinine was 14.4. ?Nephrology was consulted. ?Patient was initially on CRRT. ?Now on intermittent dialysis. ?Heremains anuric. ?Patient  was last dialyzed on 5/20. Management per nephrology.  Acute metabolic encephalopathy Most likely due to acute illness. ?Now resolved.   Chronic Systolic CHF  EF 30 to 78%. No significant volume overload on exam. ?No ARB or ACE inhibitor due to renal failure. ?Home medication list reviewed. ?It appears that he was on carvedilol previously. ?Holding for now due to his need for dialysis as well as episodes of bradycardia and frequent pauses. ?He was also in hypovolemic shock at the time of admission. ?Holding diuretics as well. ?  Volume being managed by dialysis. ?Telemetry shows PVCs. ?No significant bradycardia or pauses noted. ?Seems to be stable. ?Outpatient follow-up with cardiology.  Hypovolemic shock Resolved  Paroxysmal atrial fibrillation / bradycardia with frequent pauses  Off anticoag due to thrombocytopenia. ?Continue to monitor on telemetry for now. ?He was on apixaban previously.  Enterobacter UTI Treated with meropenem. ?He completed course of antibiotics on 5/18.  Anemia of Chronic Disease Apparently has had chronically positive fecal occult blood. ?He has required blood transfusions during this hospitalization. ?Has received 4 units of PRBC so far. ?Hemoglobin stable the last few days. ?Continue to monitor. No evidence for any overt bleeding.  Acute on chronic thrombocytopenia Thrombocytopenia thought to be secondary to sepsis. ?TTP thought to be unlikely given negative schistocytes. ?DIC panel was equivocal. ?Platelet counts remain low.  Significant drop noted today.  HIT panel ordered. No evidence for bleeding. ?  Leg Cramps - RLS Improved on baclofen and heating pads. ?  History of colon cancer status post colostomy Stable.   DVT Prophylaxis: SCDs    Code Status: Partial code Family Communication: No family at bedside.  Discussed with the patient and his sister over the phone. Disposition Plan: Management as outlined above.  Still waiting on improvement in renal  function although he continues to be anuric.  Nephrology is following.  Will likely need some form of rehab at the time of discharge.       LOS: 11 days   [facility] Pager [phone] 10/17/2017, 10:19 AM  If 7PM-7AM, please contact night-coverage at [email or web address], password [facility]

## 2017-10-17 NOTE — Progress Notes (Signed)
Physical Therapy Treatment Patient Details Name: Patrick [name] MRN: [id number] DOB: Jun 15, 1950 Today's Date: 10/17/2017    History of Present Illness Pt adm with acute metabolic encephalopathy, shock, and acute on chronic kidney failure. Pt intubated 5/10-5/13 and on CRRT 5/10-5/13. PMH - Rectal CA, colostomy, CAD, afib, HTN, heart failure, ckd    PT Comments    Pt very pleasant and willing to work with therapy. Pt reports he will walk several hundred feet depending on the day at SNF with walker. Pt with greatly improved cognition and gait today with ability to walk in hall with minguard assist. Pt encouraged to continue mobilizing with nursing assist.   HR 94-102 with gait SpO2 92-100% on RA   Follow Up Recommendations  SNF     Equipment Recommendations  None recommended by PT    Recommendations for Other Services       Precautions / Restrictions Precautions Precautions: Fall Restrictions Weight Bearing Restrictions: No    Mobility  Bed Mobility Overal bed mobility: Modified Independent             General bed mobility comments: pt sidelying on arrival and able to sit with bed flat without assist  Transfers Overall transfer level: Needs assistance     Sit to Stand: Min guard         General transfer comment: cues for hand placement and safety from bed and recliner  Ambulation/Gait Ambulation/Gait assistance: Min guard Ambulation Distance (Feet): 80 Feet Assistive device: Rolling walker (2 wheeled) Gait Pattern/deviations: Step-through pattern;Decreased stride length;Trunk flexed   Gait velocity interpretation: <1.8 ft/sec, indicate of risk for recurrent falls General Gait Details: cues for posture and position in RW with chair to follow. Pt able to walk 80' x 2 trials with seated rest between   Stairs             Wheelchair Mobility    Modified Rankin (Stroke Patients Only)       Balance Overall balance assessment: Needs assistance    Sitting balance-Leahy Scale: Good       Standing balance-Leahy Scale: Fair                              Cognition Arousal/Alertness: Awake/alert Behavior During Therapy: WFL for tasks assessed/performed Overall Cognitive Status: Impaired/Different from baseline Area of Impairment: Memory                 Orientation Level: Time;Disoriented to Current Attention Level: Alternating   Following Commands: Follows one step commands consistently       General Comments: pt oriented to place, situation and day not date. pt following all commands appropriately      Exercises      General Comments        Pertinent Vitals/Pain Pain Score: 8 (pt reports only aching and sore but rates 8) Pain Location: bil hips Pain Descriptors / Indicators: Aching;Sore Pain Intervention(s): Limited activity within patient's tolerance;Repositioned;Monitored during session    Home Living                      Prior Function            PT Goals (current goals can now be found in the care plan section) Acute Rehab PT Goals Time For Goal Achievement: 10/31/17 Potential to Achieve Goals: Good Progress towards PT goals: Progressing toward goals;Goals met and updated - see care plan    Frequency  PT Plan Current plan remains appropriate    Co-evaluation              AM-PAC PT "6 Clicks" Daily Activity  Outcome Measure  Difficulty turning over in bed (including adjusting bedclothes, sheets and blankets)?: None Difficulty moving from lying on back to sitting on the side of the bed? : A Little Difficulty sitting down on and standing up from a chair with arms (e.g., wheelchair, bedside commode, etc,.)?: A Little Help needed moving to and from a bed to chair (including a wheelchair)?: A Little Help needed walking in hospital room?: A Little Help needed climbing 3-5 steps with a railing? : A Lot 6 Click Score: 18    End of Session Equipment  Utilized During Treatment: Gait belt Activity Tolerance: Patient tolerated treatment well Patient left: in chair;with call bell/phone within reach;with chair alarm set Nurse Communication: Mobility status PT Visit Diagnosis: Other abnormalities of gait and mobility (R26.89);Difficulty in walking, not elsewhere classified (R26.2);Muscle weakness (generalized) (M62.81)     Time: 4299-8069 PT Time Calculation (min) (ACUTE ONLY): 24 min  Charges:  $Gait Training: 8-22 mins $Therapeutic Activity: 8-22 mins                    G Codes:       Elwyn Reach, PT [phone]    [place] 10/17/2017, 10:53 AM

## 2017-10-17 NOTE — Progress Notes (Signed)
[facility] Rounding Note  Background 67 y.o. yo male NHP, wheelchair bound, PMH CAD, HTN, HLD, BPH, CM with EF 35%, RV and LV dysfunction,  baseline CKD3 (creatinine 1.48 - 1.78 and on losartan outpt). Admitted 10/06/2017 with creatinine 14, AMS -> urosepsis and AKI (ATN felt 2/2 septic shock). CRRT 5/10-5/13 ([place] cath 5/10) then transitioned to IHD 5/16, dialysis dependent since. Renal US 5/10 12.1, 12.4 echodense. Of note pt had chronic indwelling foley since May 2018 due to inability to empty bladder, removed this admission  Assessment/Plan: 1. AKI on CKD3 - 2/2 ATN/septic shock from enterobacter urosepsis. CRRT 5/10-5/13, IHD 5/16, 5/18 and 5/20. 3L off with HD yesterday. No evidence return of renal function so far 1. He will need permcath this week if remains HD requiring. Will ask IR to place 2. We have not made plans for HD on the outside for him as still hoping for some recovery.  3. Of note foley out for a week, tells me had indwelling foley since May 2018 prior to current admission for inability to pass urine on his own (sounds like BPH). Doing daily bladder scans now as this issue could mask renal recovery. (but no sig urine yet) 4. Too early to call permanent dialysis dependence. 2. Urosepsis- enterobacter- meropenem finished 5/18.  3. Anemia- on ESA- no iron needed.  Transfused 5/16. Gets Aranesp 200 QMonday  4. Thrombocytopenia - acute on chronic. Has been attributed to sepsis, but down again today. Has been getting tight heparin with HD. I do not see that HIT panel has been done. Will check, avoid use of heparin with next HD. Citrate catheter lock. 5. PAF/bradycardia - no anticoag at present 2/2 low plts   Subjective: No urine on bladders scans yet (doing daily) 3L off with HD yesterday Weight down from 90.1->87.5 Says slept poorly last PM, no other issues right now  Objective Vital signs in last 24 hours: Vitals:   10/16/17 1948 10/16/17 2306 10/17/17 0300 10/17/17 0706  BP: 116/77  118/75 136/76 130/86  Pulse: 76 84 89 100  Resp: 15 20 19 14   Temp: 97.9 F (36.6 C) 99.4 F (37.4 C) 97.8 F (36.6 C) 98.5 F (36.9 C)  TempSrc: Oral Oral Oral Oral  SpO2: 94% 95% 98% 93%  Weight:   85.9 kg (189 lb 6 oz)   Height:       Weight change: 1.8 kg (3 lb 15.5 oz)  Intake/Output Summary (Last 24 hours) at 10/17/2017 1002 Last data filed at 10/17/2017 0948 Gross per 24 hour  Intake 780 ml  Output 3400 ml  Net -2620 ml   Physical Exam: Very nice older man VS and weight change with HD noted Lungs with decr BS bases and some rales S1S2 No S3 1-2/6 murmur LSB Abd soft Colostomy bag, brown stool 2+ dependent pitting edema LE's up to the hips not really any different yet L IJ vas cath in place (5/10)   Recent Labs  Lab 10/15/17 0431 10/16/17 0416 10/17/17 0310  NA 135 136 136  K 4.0 4.3 3.9  CL 98* 97* 98*  CO2 28 27 28   GLUCOSE 82 90 82  BUN 23* 31* 18  CREATININE 3.95* 4.74* 3.42*  CALCIUM 8.4* 8.8* 8.6*  PHOS 3.1 3.6 2.8    Recent Labs  Lab 10/15/17 0431 10/16/17 0416 10/17/17 0310  ALBUMIN 2.3* 2.3* 2.3*    Recent Labs  Lab 10/11/17 0430  10/13/17 0350 10/14/17 0256 10/15/17 0434 10/16/17 0416 10/17/17 0310  WBC 5.2   < >  5.6 5.7 4.1 4.2 4.3  NEUTROABS 3.7  --   --   --   --   --   --   HGB 7.7*   < > 9.0* 9.4* 9.2* 9.3* 9.2*  HCT 24.2*   < > 28.0* 29.8* 29.5* 29.1* 29.0*  MCV 86.1   < > 84.1 86.6 87.3 87.7 87.3  PLT 44*   < > 78* 86* 78* 94* 58*   < > = values in this interval not displayed.   CBG: Recent Labs  Lab 10/15/17 1954 10/15/17 2340 10/16/17 0318 10/16/17 2013 10/16/17 2306  GLUCAP 89 105* 73 87 81   Lab Results  Component Value Date   IRON 45 10/11/2017   TIBC 148 (L) 10/11/2017   FERRITIN 686 (H) 10/11/2017        Transferrin saturation                                   30%  Scheduled Medications: . Chlorhexidine Gluconate Cloth  6 each Topical Daily  . darbepoetin (ARANESP) injection - NON-DIALYSIS  200 mcg  Subcutaneous Q Mon-1800  . feeding supplement (NEPRO CARB STEADY)  237 mL Oral TID BM  . pantoprazole  40 mg Oral Daily  . sodium chloride flush  10-40 mL Intracatheter Q12H   . sodium chloride    . sodium chloride     Jamal Maes, MD [place] Pager 10/17/2017, 10:02 AM

## 2017-10-17 NOTE — Consult Note (Signed)
Chief Complaint: Patient was seen in consultation today for renal failure  Referring Physician(s): Dr. Lorrene Reid  Supervising Physician: Arne Cleveland  Patient Status: [facility] - In-pt  History of Present Illness: Patrick [name] is a 67 y.o. male with past medical history of CAD, CHF, HTN, HLD who presented to [facility] with AMS, AKI, anemia. Patient requiring emergent dialysis via [place].  His renal failure has not resolved and patient now in need of more permanent access.  IR consulted for tunneled dialysis catheter placement.   Patient has been eating today.  He is awake and alert, sitting up on side of bed.  Can answer most questions, but clearly some level of confusion remains.   Past Medical History:  Diagnosis Date  . Arthritis    "right hand; right knee" (06/19/2014)  . CAD (coronary artery disease)    2v CAD with subtotal occlusion of small co-dominant RCA and borderline lesion in moderate-sized OM-1  . CHF (congestive heart failure) (HCC)    Preserved EF  . Coronary artery disease involving native coronary artery of native heart with angina pectoris ([place]) 09/11/2016  . Degenerative joint disease of knee, right   . Dysrhythmia   . Enlarged prostate   . Hyperlipidemia   . Hypertension   . Pneumonia 05/2014  . Prediabetes 10/03/2014  . Scrotal edema 04/03/2015  . SMALL BOWEL OBSTRUCTION, HX OF 08/21/2007   Annotation: with narrowing in the ileocecal region Qualifier: Diagnosis of  By: Hassell Done FNP, Tori Milks      Past Surgical History:  Procedure Laterality Date  . CARDIAC CATHETERIZATION N/A 02/16/2016   Procedure: Right/Left Heart Cath and Coronary Angiography;  Surgeon: Jolaine Artist, MD;  Location: [facility];  Service: Cardiovascular;  Laterality: N/A;  . COLONOSCOPY N/A 04/01/2016   Procedure: COLONOSCOPY;  Surgeon: Leighton Ruff, MD;  Location: [facility];  Service: Endoscopy;  Laterality: N/A;  . INGUINAL HERNIA REPAIR Left 1990's  . IR GENERIC HISTORICAL   07/05/2016   IR RADIOLOGIST EVAL & MGMT 07/05/2016 Corrie Mckusick, DO [facility] INTERV RAD  . IR RADIOLOGIST EVAL & MGMT  03/29/2017  . XI ROBOT ABDOMINAL PERINEAL RESECTION N/A 09/28/2016   Procedure: XI ROBOT ABDOMINOPERINEAL RESECTION WITH PERMANENT COLOSTOMY ERAS PATHWAY;  Surgeon: Michael Boston, MD;  Location: [facility] ORS;  Service: General;  Laterality: N/A;    Allergies: Nsaids  Medications: Prior to Admission medications   Medication Sig Start Date End Date Taking? Authorizing Provider  allopurinol (ZYLOPRIM) 300 MG tablet take 1 tablet by mouth once daily 04/03/17  Yes Mayo, Pete Pelt, MD  apixaban (ELIQUIS) 5 MG TABS tablet Take 1 tablet (5 mg total) by mouth 2 (two) times daily. 01/26/17  Yes Minus Breeding, MD  atorvastatin (LIPITOR) 10 MG tablet Take 1 tablet by mouth daily. 08/19/17  Yes [provider]  buPROPion (WELLBUTRIN) 100 MG tablet take 1 tablet by mouth twice a day 03/14/17  Yes Mikell, Jeani Sow, MD  carvedilol (COREG) 25 MG tablet take 1 tablets by mouth twice a day 02/21/17  Yes Minus Breeding, MD  cefTRIAXone (ROCEPHIN) 1 g injection Inject 1 g into the muscle daily. For 3 days. Started 10-05-17   Yes [provider]  clotrimazole-betamethasone (LOTRISONE) cream Apply 1 application topically 2 (two) times daily. 08/15/17  Yes [provider]  ferrous sulfate 325 (65 FE) MG tablet Take 1 tablet (325 mg total) by mouth 2 (two) times daily with a meal. 12/26/16  Yes Mayo, Pete Pelt, MD  finasteride (PROSCAR) 5  MG tablet take 1 tablet by mouth once daily 03/14/17  Yes Mikell, Jeani Sow, MD  losartan (COZAAR) 50 MG tablet Take 1 tablet (50 mg total) by mouth daily. 12/09/16  Yes Mayo, Pete Pelt, MD  Multiple Vitamins-Minerals (MULTIVITAMIN WITH MINERALS) tablet Take 1 tablet by mouth daily. Men's 50+ Multivitamin   Yes [provider]  Omega-3 1000 MG CAPS Take 2 capsules (2,000 mg total) by mouth 2 (two) times daily. 12/09/16  Yes Mayo, Pete Pelt, MD    potassium chloride SA (K-DUR,KLOR-CON) 20 MEQ tablet Take 2 tablets (40 mEq total) by mouth daily. And 20 meq by mouth in the evening 05/26/17  Yes Lockamy, Timothy, DO  tamsulosin (FLOMAX) 0.4 MG CAPS capsule Take 0.4 mg by mouth daily.    Yes [provider]  torsemide (DEMADEX) 20 MG tablet Take 20 mg by mouth 2 (two) times daily.   Yes [provider]  traZODone (DESYREL) 100 MG tablet Take 3 tablets (300 mg total) by mouth at bedtime. 01/02/17  Yes Mayo, Pete Pelt, MD  acetaminophen (TYLENOL) 500 MG tablet Take 1,000 mg by mouth every 6 (six) hours as needed (for pain/fever/headaches.).     [provider]  Capsaicin-Menthol-Methyl Sal (CAPSAICIN-METHYL SAL-MENTHOL) 0.025-1-12 % CREA Apply to left and or right leg as needed once to twice a day for pain Patient not taking: Reported on 10/06/2017 07/25/17   Landis Martins, DPM  lovastatin (MEVACOR) 20 MG tablet Take 1 tablet (20 mg total) by mouth at bedtime. Patient not taking: Reported on 10/06/2017 12/26/16   Mayo, Pete Pelt, MD  methocarbamol (ROBAXIN) 750 MG tablet Take 1 tablet (750 mg total) by mouth 4 (four) times daily as needed (use for muscle cramps/pain). 09/28/16   Michael Boston, MD  nitroGLYCERIN (NITROSTAT) 0.4 MG SL tablet place 1 tablet under the tongue if needed every 5 minutes for chest pain for 3 doses IF NO RELIEF AFTER 1ST DOSE CALL 911. 05/10/17   Mayo, Pete Pelt, MD  oxyCODONE-acetaminophen (PERCOCET/ROXICET) 5-325 MG tablet Take 1 tablet by mouth every 6 (six) hours as needed for severe pain.    [provider]     Family History  Problem Relation Age of Onset  . Hypertension Mother   . Diabetes Mother   . Stroke Mother   . Cancer Father 74       Prostate  . Dementia Father   . COPD Sister   . Arthritis Sister   . Edema Sister     Social History   Socioeconomic History  . Marital status: Single    Spouse name: Not on file  . Number of children: Not on file  . Years of  education: Not on file  . Highest education level: Not on file  Occupational History  . Not on file  Social Needs  . Financial resource strain: Not on file  . Food insecurity:    Worry: Not on file    Inability: Not on file  . Transportation needs:    Medical: Not on file    Non-medical: Not on file  Tobacco Use  . Smoking status: Former Smoker    Years: 5.00    Types: Pipe, Landscape architect  . Smokeless tobacco: Never Used  . Tobacco comment: 06/19/2014 "stopped smoking in ~ 2014; used to smoke a pipe or cigar a couple times/month"  Substance and Sexual Activity  . Alcohol use: No  . Drug use: No  . Sexual activity: Never  Lifestyle  . Physical activity:  Days per week: Not on file    Minutes per session: Not on file  . Stress: Not on file  Relationships  . Social connections:    Talks on phone: Not on file    Gets together: Not on file    Attends religious service: Not on file    Active member of club or organization: Not on file    Attends meetings of clubs or organizations: Not on file    Relationship status: Not on file  Other Topics Concern  . Not on file  Social History Narrative   Single, lives w/his sister of whom he is caregiver     Review of Systems: A 12 point ROS discussed and pertinent positives are indicated in the HPI above.  All other systems are negative.  Review of Systems  Constitutional: Negative for fatigue and fever.  Respiratory: Negative for cough and shortness of breath.   Cardiovascular: Negative for chest pain.  Gastrointestinal: Negative for abdominal pain.  Musculoskeletal: Negative for back pain.  Psychiatric/Behavioral: Negative for behavioral problems and confusion.    Vital Signs: BP 130/86 (BP Location: Left Arm)   Pulse 100   Temp 98.5 F (36.9 C) (Oral)   Resp 14   Ht 5\' 6"  (1.676 m)   Wt 189 lb 6 oz (85.9 kg)   SpO2 93%   BMI 30.57 kg/m   Physical Exam  Constitutional: He is oriented to person, place, and time. He appears  well-developed.  Cardiovascular: Normal rate, regular rhythm and normal heart sounds.  Pulmonary/Chest: Effort normal and breath sounds normal. No respiratory distress.  Abdominal: Soft. He exhibits no distension. There is no tenderness.  Neurological: He is alert and oriented to person, place, and time.  Skin: Skin is warm and dry.  Temp cath in place on left.   Psychiatric:  confused  Nursing note and vitals reviewed.    MD Evaluation Airway: WNL Heart: WNL Abdomen: WNL Chest/ Lungs: WNL ASA  Classification: 3 Mallampati/Airway Score: One   Imaging: Dg Abd 1 View  Result Date: 10/06/2017 CLINICAL DATA:  Orogastric tube placement. EXAM: ABDOMEN - 1 VIEW COMPARISON:  06/01/2016. FINDINGS: Orogastric tube tip in the proximal stomach and side hole in the proximal to mid stomach. Gaseous distention of the stomach. Again demonstrated are 3 large gallstones in the gallbladder measuring up to 2.6 cm in maximum diameter each. No bowel gas visualized except for the gas in the stomach. Patchy opacity in the left lower lobe. Lumbar spine degenerative changes and minimal scoliosis. IMPRESSION: 1. Gaseous distention of the stomach with no other bowel gas seen. This is concerning for gastric outlet obstruction. 2. Cholelithiasis. 3. Patchy left lower lobe atelectasis or pneumonia. Electronically Signed   By: Claudie Revering M.D.   On: 10/06/2017 19:55   US Renal  Result Date: 10/06/2017 CLINICAL DATA:  67 year old male with acute renal failure. EXAM: RENAL / URINARY TRACT ULTRASOUND COMPLETE COMPARISON:  10/06/2016 ultrasound and 03/06/2017 MR FINDINGS: Right Kidney: Length: 12.1 cm. Diffuse increased echogenicity noted. No solid mass or hydronephrosis noted. Left Kidney: Length: 12.4 cm. Diffuse increased echogenicity noted. No solid mass or hydronephrosis identified. A 1.5 cm cyst is again identified. Bladder: A Foley catheter is present within a collapsed bladder. Incidental note is made of ascites.  IMPRESSION: 1. Echogenic kidneys bilaterally compatible with medical renal disease. No evidence of hydronephrosis. 2. Ascites Electronically Signed   By: Margarette Canada M.D.   On: 10/06/2017 16:41   Dg Chest [facility]  Result Date: 10/09/2017 CLINICAL DATA:  Shortness of breath EXAM: PORTABLE CHEST 1 VIEW COMPARISON:  10/08/2017 FINDINGS: Cardiac shadow remains mildly enlarged. Endotracheal tube, nasogastric catheter and left jugular temporary dialysis catheter are again seen and stable. The lungs are hypoinflated. Some persistent left retrocardiac density is noted. No new focal abnormality is noted. IMPRESSION: No significant change from the previous day. Electronically Signed   By: Inez Catalina M.D.   On: 10/09/2017 07:10   Dg Chest Port 1 View  Result Date: 10/08/2017 CLINICAL DATA:  Endotracheal tube placement. EXAM: PORTABLE CHEST 1 VIEW COMPARISON:  Oct 07, 2017 FINDINGS: Mediastinal contour is stable. The heart size is enlarged. Endotracheal tube is identified distal tip 4.9 cm from carina. A left central venous line is identified distal tip in the superior vena cava. A nasogastric tube is noted with distal tip not included on film but is probably in the stomach. Mild patchy opacities identified in the left lung base. There is no pulmonary edema or pleural effusion. The visualized skeletal structures are stable. IMPRESSION: Life supporting devices as described. Endotracheal tube in good position. Cardiomegaly. Patchy opacity of the medial left lung base probably due to atelectasis but superimposed pneumonia is not excluded. Electronically Signed   By: Abelardo Diesel M.D.   On: 10/08/2017 08:03   Dg Chest Port 1 View  Result Date: 10/07/2017 CLINICAL DATA:  Respiratory failure EXAM: PORTABLE CHEST 1 VIEW COMPARISON:  Oct 06, 2017 FINDINGS: The ETT, left central line, and NG tube are again identified and stable within visualize limits. No pneumothorax. Stable cardiomegaly. The hila and mediastinum are  unchanged. Mild opacity in the medial right lung base may represent atelectasis, vascular crowding, or subtle infiltrate. Recommend attention on follow-up. No other acute abnormalities. IMPRESSION: 1. Stable support apparatus as above. 2. Mild increased opacity in the medial right lung base could represent vascular crowding, atelectasis, or subtle infiltrate. Recommend attention on follow-up. Electronically Signed   By: Dorise Bullion III M.D   On: 10/07/2017 07:47   Dg Chest Port 1 View  Result Date: 10/06/2017 CLINICAL DATA:  Intubated. EXAM: PORTABLE CHEST 1 VIEW COMPARISON:  Earlier today. FINDINGS: Endotracheal tube tip 3.6 cm above the carina. Left jugular catheter unchanged. Stable enlarged cardiac silhouette and prominent interstitial markings. Mild increase in patchy opacity in the left lower lobe. Minimal right basilar atelectasis with improvement. No acute bony abnormality. IMPRESSION: 1. Endotracheal tube tip 3.5 cm above the carina. This could be retracted 3.5 cm to place it at the level of the clavicles. 2. Mildly increased patchy atelectasis or pneumonia in the left lower lobe. 3. Minimal right basilar atelectasis with improvement. 4. Stable cardiomegaly and chronic interstitial lung disease. Electronically Signed   By: Claudie Revering M.D.   On: 10/06/2017 19:53   Dg Chest Port 1 View  Result Date: 10/06/2017 CLINICAL DATA:  Encounter for central line placement. EXAM: PORTABLE CHEST 1 VIEW COMPARISON:  Radiograph of same day. FINDINGS: Stable cardiomegaly. Hypoinflation of the lungs is noted with mild bibasilar subsegmental atelectasis. Interval placement of left internal jugular dialysis catheter with distal tip in expected position of the SVC. No pneumothorax or significant pleural effusion is noted. Bony thorax is unremarkable. IMPRESSION: Interval placement of left internal jugular dialysis catheter with distal tip in expected position of SVC. No pneumothorax is noted. Hypoinflation of the  lungs with mild bibasilar subsegmental atelectasis. Electronically Signed   By: Marijo Conception, M.D.   On: 10/06/2017 15:23   Dg Chest Portable  1 View  Result Date: 10/06/2017 CLINICAL DATA:  67 year old male with a history of hypotension EXAM: PORTABLE CHEST 1 VIEW COMPARISON:  05/23/2017, 10/08/2016 FINDINGS: Cardiomediastinal silhouette unchanged, likely accentuated by apical lordotic positioning. Persisting cardiomegaly. No pneumothorax.  No large pleural effusion.  Low lung volumes. IMPRESSION: Redemonstration of cardiomegaly. The accentuated appearance is favored to be secondary to the apical lordotic positioning, however, new pericardial effusion not excluded. If there is concern for pericardial effusion, recommend correlation with ECHO. No evidence of lobar pneumonia or overt edema. Electronically Signed   By: Corrie Mckusick D.O.   On: 10/06/2017 12:15   Dg Abd Portable 1v  Result Date: 10/08/2017 CLINICAL DATA:  Gastric outlet obstruction EXAM: PORTABLE ABDOMEN - 1 VIEW COMPARISON:  Portable exam 1118 hours compared to 10/06/2017 FINDINGS: Nasogastric tube coiled in stomach, which is decompressed. Air-filled loops of nonspecific small bowel mid abdomen. No bowel wall thickening. Paucity of colonic gas. Multiple calcified gallstones RIGHT upper quadrant. Bones demineralized. IMPRESSION: Cholelithiasis. Decompressed stomach versus prior exam. Nonspecific mildly dilated air-filled loops of small bowel in the mid abdomen with paucity of colonic gas, nonspecific, could reflect ileus or even potentially small-bowel obstruction; further imaging as clinically warranted. Electronically Signed   By: Lavonia Dana M.D.   On: 10/08/2017 11:29    Labs:  CBC: Recent Labs    10/14/17 0256 10/15/17 0434 10/16/17 0416 10/17/17 0310  WBC 5.7 4.1 4.2 4.3  HGB 9.4* 9.2* 9.3* 9.2*  HCT 29.8* 29.5* 29.1* 29.0*  PLT 86* 78* 94* 58*    COAGS: Recent Labs    10/06/17 1123 10/06/17 2007  INR 2.28 2.17    APTT  --  53*    BMP: Recent Labs    10/14/17 0245 10/15/17 0431 10/16/17 0416 10/17/17 0310  NA 135 135 136 136  K 3.6 4.0 4.3 3.9  CL 97* 98* 97* 98*  CO2 27 28 27 28   GLUCOSE 82 82 90 82  BUN 32* 23* 31* 18  CALCIUM 8.6* 8.4* 8.8* 8.6*  CREATININE 5.33* 3.95* 4.74* 3.42*  GFRNONAA 10* 14* 11* 17*  GFRAA 11* 16* 13* 20*    LIVER FUNCTION TESTS: Recent Labs    12/22/16 1446  05/23/17 1332 10/06/17 1123 10/06/17 2007  10/14/17 0245 10/15/17 0431 10/16/17 0416 10/17/17 0310  BILITOT 0.63  --  1.5* 1.5* 1.8*  1.6*  --   --   --   --   --   AST 13  --  17 9* 11*  9*  --   --   --   --   --   ALT 9  --  10* 12* 11*  12*  --   --   --   --   --   ALKPHOS 118  --  118 100 94  94  --   --   --   --   --   PROT 7.6  --  7.4 6.9 6.6  6.5  --   --   --   --   --   ALBUMIN 3.2*   < > 3.5 2.6* 2.5*  2.3*   < > 2.3* 2.3* 2.3* 2.3*   < > = values in this interval not displayed.    TUMOR MARKERS: No results for input(s): AFPTM, CEA, CA199, CHROMGRNA in the last 8760 hours.  Assessment and Plan: Acute on chronic renal failure Patient now in need of tunneled dialysis catheter.  He has not been NPO today.  He is  confused.  Will work toward placement as schedule allows; may be as early as tomorrow if able to contact sister for consent. Will place orders for tomorrow.   Thank you for this interesting consult.  I greatly enjoyed meeting Patrick [name] and look forward to participating in their care.  A copy of this report was sent to the requesting provider on this date.  Electronically Signed: Docia Barrier, PA 10/17/2017, 1:05 PM   I spent a total of 40 Minutes    in face to face in clinical consultation, greater than 50% of which was counseling/coordinating care for renal failure.

## 2017-10-17 NOTE — Progress Notes (Signed)
Md notified for pressure release cushion for pt.  Pt c/o buttocks hurting and can not get comfortable. Assist pt up to chair. Will continue to monitor Saunders Revel T

## 2017-10-18 ENCOUNTER — Inpatient Hospital Stay (HOSPITAL_COMMUNITY): Payer: Medicare Other

## 2017-10-18 ENCOUNTER — Encounter (HOSPITAL_COMMUNITY): Payer: Self-pay | Admitting: Interventional Radiology

## 2017-10-18 HISTORY — PX: IR FLUORO GUIDE CV LINE RIGHT: IMG2283

## 2017-10-18 HISTORY — PX: IR US GUIDE VASC ACCESS RIGHT: IMG2390

## 2017-10-18 LAB — RENAL FUNCTION PANEL
ANION GAP: 11 (ref 5–15)
Albumin: 2.3 g/dL — ABNORMAL LOW (ref 3.5–5.0)
BUN: 31 mg/dL — ABNORMAL HIGH (ref 6–20)
CALCIUM: 8.7 mg/dL — AB (ref 8.9–10.3)
CO2: 27 mmol/L (ref 22–32)
Chloride: 98 mmol/L — ABNORMAL LOW (ref 101–111)
Creatinine, Ser: 4.52 mg/dL — ABNORMAL HIGH (ref 0.61–1.24)
GFR calc non Af Amer: 12 mL/min — ABNORMAL LOW (ref >60)
GFR, EST AFRICAN AMERICAN: 14 mL/min — AB (ref >60)
Glucose, Bld: 82 mg/dL (ref 65–99)
POTASSIUM: 4.1 mmol/L (ref 3.5–5.1)
Phosphorus: 3.6 mg/dL (ref 2.5–4.6)
Sodium: 136 mmol/L (ref 135–145)

## 2017-10-18 LAB — CBC
HCT: 29.6 % — ABNORMAL LOW (ref 39.0–52.0)
HEMOGLOBIN: 9.2 g/dL — AB (ref 13.0–17.0)
MCH: 27.4 pg (ref 26.0–34.0)
MCHC: 31.1 g/dL (ref 30.0–36.0)
MCV: 88.1 fL (ref 78.0–100.0)
Platelets: 66 10*3/uL — ABNORMAL LOW (ref 150–400)
RBC: 3.36 MIL/uL — ABNORMAL LOW (ref 4.22–5.81)
RDW: 21.5 % — ABNORMAL HIGH (ref 11.5–15.5)
WBC: 4.8 10*3/uL (ref 4.0–10.5)

## 2017-10-18 LAB — HEPARIN INDUCED PLATELET AB (HIT ANTIBODY): Heparin Induced Plt Ab: 0.465 OD — ABNORMAL HIGH (ref 0.000–0.400)

## 2017-10-18 MED ORDER — ANTICOAGULANT SODIUM CITRATE 4% (200MG/5ML) IV SOLN
5.0000 mL | Freq: Once | Status: DC
  Filled 2017-10-18: qty 250

## 2017-10-18 MED ORDER — MIDAZOLAM HCL 2 MG/2ML IJ SOLN
INTRAMUSCULAR | Status: AC | PRN
  Administered 2017-10-18: 0.5 mg via INTRAVENOUS

## 2017-10-18 MED ORDER — LIDOCAINE HCL 1 % IJ SOLN
INTRAMUSCULAR | Status: AC
  Filled 2017-10-18: qty 20

## 2017-10-18 MED ORDER — LIDOCAINE HCL (PF) 1 % IJ SOLN
INTRAMUSCULAR | Status: AC | PRN
  Administered 2017-10-18: 5 mL

## 2017-10-18 MED ORDER — CEFAZOLIN SODIUM-DEXTROSE 2-4 GM/100ML-% IV SOLN
INTRAVENOUS | Status: AC
  Filled 2017-10-18: qty 100

## 2017-10-18 MED ORDER — HEPARIN SODIUM (PORCINE) 1000 UNIT/ML IJ SOLN
INTRAMUSCULAR | Status: AC
  Filled 2017-10-18: qty 1

## 2017-10-18 MED ORDER — FENTANYL CITRATE (PF) 100 MCG/2ML IJ SOLN
INTRAMUSCULAR | Status: AC
  Filled 2017-10-18: qty 2

## 2017-10-18 MED ORDER — FENTANYL CITRATE (PF) 100 MCG/2ML IJ SOLN
INTRAMUSCULAR | Status: AC | PRN
  Administered 2017-10-18: 25 ug via INTRAVENOUS

## 2017-10-18 MED ORDER — MIDAZOLAM HCL 2 MG/2ML IJ SOLN
INTRAMUSCULAR | Status: AC
  Filled 2017-10-18: qty 2

## 2017-10-18 NOTE — Progress Notes (Signed)
Nutrition Follow-up  DOCUMENTATION CODES:   Not applicable  INTERVENTION:    Nepro Shake po TID, each supplement provides 425 kcal and 19 grams protein  NEW NUTRITION DIAGNOSIS:   Increased nutrient needs related to chronic illness as evidenced by estimated needs, ongoing  GOAL:   Patient will meet greater than or equal to 90% of their needs, progressing  MONITOR:   PO intake, Supplement acceptance, Labs, Weight trends, Skin, I & O's  ASSESSMENT:   67 y/o male PMHx CAD, Afib, HF (EF 30-35%), remote colon cancer s/p colostomy, CKD3-4, WC bound, HTN/HLD. ALF resident. Presents after being found w/ AMS and abnormal renal labs. Worked up for AKI, acidosis, anemia. Intubated for airway protection. Started on CRRT.   5/13 extubated, CRRT d/c   PO intake much improved at 75-100% per flowsheets. Pt with AKI on CKD3. Nephrology following. Started on IHD 5/16. S/p tunneled dialysis catheter placement per IR today.  Receiving Nepro Carb Steady shakes BID. Labs and medications reviewed. CBG's [id number].  Diet Order:   Diet Order           Diet renal with fluid restriction Fluid restriction: 1200 mL Fluid; Room service appropriate? Yes; Fluid consistency: Thin  Diet effective now         EDUCATION NEEDS:   No education needs have been identified at this time  Skin:  Skin Assessment: Reviewed RN Assessment  Last BM:  5/21 - colostomy  Height:   Ht Readings from Last 1 Encounters:  10/07/17 5\' 6"  (1.676 m)    Weight:   Wt Readings from Last 1 Encounters:  10/18/17 183 lb 13.8 oz (83.4 kg)    Ideal Body Weight:  70 kg  BMI:  Body mass index is 29.68 kg/m.  Estimated Nutritional Needs:   Kcal:  1900-2100  Protein:  100-115 grams  Fluid:  per MD  Arthur Holms, RD, LDN Pager #: [phone] After-Hours Pager #: [phone]

## 2017-10-18 NOTE — Care Management Note (Signed)
Case Management Note  Patient Details  Name: Patrick [name] MRN: [id number] Date of Birth: 1951/01/24  Subjective/Objective:   Pt with urosepsis and AKI               Action/Plan:  PTA for [facility] as a long term pt.  Pt now requiring IHD - not yet determined if this will be perm.  CM will continue to follow for discharge needs   Expected Discharge Date:                  Expected Discharge Plan:  Skilled Nursing Facility(From [facility])  In-House Referral:  Clinical Social Work  Discharge planning Services  CM Consult  Post Acute Care Choice:    Choice offered to:     DME Arranged:    DME Agency:     HH Arranged:    HH Agency:     Status of Service:     If discussed at [employer], dates discussed:    Additional Comments: 10/18/2017 The decision was made to move forward with perm HD access as pt will likely need perm HD - CSW aware. Maryclare Labrador, RN 10/18/2017, 9:12 AM

## 2017-10-18 NOTE — Procedures (Signed)
RIJV HD catheter  Tip SVC RA EBL 0 Comp 0

## 2017-10-18 NOTE — Progress Notes (Signed)
[facility]                                                                              Patient Demographics  Patrick [name], is a 67 y.o. male, DOB - 09/13/1950, ZWC:[id number]  Admit date - 10/06/2017   Admitting Physician Chesley Mires, MD  Outpatient Primary MD for the patient is Patient, [name]  Outpatient specialists:   LOS - 12  days   Medical records reviewed and are as summarized below:    Chief Complaint  Patient presents with  . Abnormal Lab       Brief summary   67 yo M SNF resident w/ a Hx CAD, Chronic Systolic CHF (EF 23-53%), remote Colon Cancer w/ perm end colostomy in 2018, CKD Stage III-IV, SBO, and an enlarged prostate who presented to the ED 5/10 after being found w/ altered LOC and several lab abnormalities including cr 13.35 and K 5.64m Hgb 7.5, and hypoglycemia. He was also hypotensive w/ SBP in 70s. He initially underwent CRRT. Now getting intermittent dialysis. 5/10 admit - transfused 1U PRBC - Vascath placed - CRRT initiated 5/13 CRRT stopped  5/11 TTE - diffuse hypokinesis - EF 35-40% - mild mitral regurg 5/16 IHD Patient was transferred to hospitalist service on 10/11/2017  Assessment & Plan    Acute on chronic CKD stage III/IV -Presented with creatinine of 14.4, baseline creatinine 1.6.  Likely due to ATN in the setting of septic shock -Nephrology was consulted and was placed on CRRT. -Currently on intermittent hemodialysis, last hemodialysis on 5/20.  Per nephrology, he will need permacath this week if requiring hemodialysis.  Enterobacter UTI -Finished meropenem on 5/18   Anemia of chronic disease -Likely due to CKD, transfused on 5/16, on Aranesp weekly -Has received 4 units of packed RBCs this admission.  - h/h stable  Thrombocytopenia, acute on chronic, possibly worsened due to sepsis -DIC panel equivocal, unlikely TTP, negative schistocytes. -Follow HIT panel, no heparin products  Acute metabolic encephalopathy  likely due to #1 - improving  Hypovolemic shock -Likely due to #1, resolved  Paroxysmal atrial fibrillation, bradycardia with frequent pauses - Not on anticoagulation due to thrombocytopenia, was on apixaban previously  History of colon CA status post colostomy  Code Status: partial  DVT Prophylaxis:   SCD's Family Communication: Discussed in detail with the patient, all imaging results, lab results explained to the patient    Disposition Plan:  Awaiting final recommendations from renal   Time Spent in minutes   35 minutes  Procedures:  5/13 CRRT stopped  5/11 TTE - diffuse hypokinesis - EF 35-40% - mild mitral regurg 5/16 IHD  Consultants:   PCCM Nephrology   Antimicrobials:      Medications  Scheduled Meds: . Chlorhexidine Gluconate Cloth  6 each Topical Daily  . darbepoetin (ARANESP) injection - NON-DIALYSIS  200 mcg Subcutaneous Q Mon-1800  . feeding supplement (NEPRO CARB STEADY)  237 mL Oral TID BM  . pantoprazole  40 mg Oral Daily  . sodium chloride flush  10-40 mL Intracatheter Q12H  . sodium chloride flush  3 mL Intravenous Q12H   Continuous Infusions: .  sodium chloride    . sodium chloride    . sodium chloride    .  ceFAZolin (ANCEF) IV     PRN Meds:.sodium chloride, sodium chloride, sodium chloride, alteplase, baclofen, lidocaine (PF), lidocaine-prilocaine, oxyCODONE-acetaminophen, pentafluoroprop-tetrafluoroeth, sodium chloride flush, sodium chloride flush   Antibiotics   Anti-infectives (From admission, onward)   Start     Dose/Rate Route Frequency Ordered Stop   10/18/17 0800  ceFAZolin (ANCEF) IVPB 2g/100 mL premix     2 g 200 mL/hr over 30 Minutes Intravenous On call 10/17/17 1154 10/19/17 0800   10/10/17 2200  meropenem (MERREM) 500 mg in sodium chloride 0.9 % 100 mL IVPB     500 mg 200 mL/hr over 30 Minutes Intravenous Every 24 hours 10/10/17 1022 10/14/17 2235   10/08/17 1200  meropenem (MERREM) 1 g in sodium chloride 0.9 % 100 mL  IVPB  Status:  Discontinued     1 g 200 mL/hr over 30 Minutes Intravenous Every 12 hours 10/08/17 1050 10/10/17 1022   10/07/17 1600  vancomycin (VANCOCIN) IVPB 1000 mg/200 mL premix  Status:  Discontinued     1,000 mg 200 mL/hr over 60 Minutes Intravenous Every 24 hours 10/06/17 2007 10/08/17 1050   10/07/17 0400  piperacillin-tazobactam (ZOSYN) IVPB 3.375 g  Status:  Discontinued     3.375 g 100 mL/hr over 30 Minutes Intravenous Every 6 hours 10/06/17 2313 10/08/17 1050   10/06/17 2000  piperacillin-tazobactam (ZOSYN) IVPB 3.375 g  Status:  Discontinued     3.375 g 12.5 mL/hr over 240 Minutes Intravenous Every 6 hours 10/06/17 1955 10/06/17 2313   10/06/17 1615  vancomycin (VANCOCIN) 2,000 mg in sodium chloride 0.9 % 500 mL IVPB     2,000 mg 250 mL/hr over 120 Minutes Intravenous  Once 10/06/17 1602 10/06/17 1815   10/06/17 1600  piperacillin-tazobactam (ZOSYN) IVPB 3.375 g     3.375 g 100 mL/hr over 30 Minutes Intravenous  Once 10/06/17 1602 10/06/17 1706   10/06/17 1130  cefTRIAXone (ROCEPHIN) 1 g in sodium chloride 0.9 % 100 mL IVPB  Status:  Discontinued     1 g 200 mL/hr over 30 Minutes Intravenous Every 24 hours 10/06/17 1101 10/06/17 1516   10/06/17 0400  piperacillin-tazobactam (ZOSYN) IVPB 3.375 g  Status:  Discontinued     3.375 g 12.5 mL/hr over 240 Minutes Intravenous Every 12 hours 10/06/17 1602 10/06/17 1955        Subjective:   Patrick [name] was seen and examined today. No significant complaints.  Patient denies dizziness, chest pain, abdominal pain, N/V. No acute events overnight.    Objective:   Vitals:   10/17/17 2003 10/17/17 2342 10/18/17 0352 10/18/17 0725  BP: (!) 141/81  139/69 118/74  Pulse: 78 87 80 80  Resp: 15 (!) 24 20 (!) 21  Temp: 98.7 F (37.1 C) 98.2 F (36.8 C) 98.2 F (36.8 C) 97.7 F (36.5 C)  TempSrc: Oral Oral Oral Oral  SpO2: 96% 94% 96% 97%  Weight:   86.1 kg (189 lb 13.1 oz)   Height:        Intake/Output Summary (Last 24 hours)  at 10/18/2017 1029 Last data filed at 10/17/2017 2200 Gross per 24 hour  Intake 480 ml  Output 120 ml  Net 360 ml     Wt Readings from Last 3 Encounters:  10/18/17 86.1 kg (189 lb 13.1 oz)  08/29/17 97.5 kg (215 lb)  05/26/17 93.7 kg (206 lb 9.6 oz)     Exam  General: Alert and oriented x 3, NAD  Eyes:  HEENT:  Atraumatic, normocephalic  Cardiovascular: S1 S2 auscultated, no murmurs. Regular rate and rhythm.  Respiratory: Clear to auscultation bilaterally, no wheezing, rales or rhonchi  Gastrointestinal: Soft, nontender, nondistended, + bowel sounds, +colostomy  Ext: no pedal edema bilaterally  Neuro:no new FND's   Musculoskeletal: No digital cyanosis, clubbing  Skin: No rashes  Psych: Normal affect and demeanor, alert and oriented x3    Data Reviewed:  I have personally reviewed following labs and imaging studies  Micro Results Recent Results (from the past 240 hour(s))  MRSA PCR Screening     Status: Abnormal   Collection Time: 10/09/17 12:20 PM  Result Value Ref Range Status   MRSA by PCR POSITIVE (A) NEGATIVE Final    Comment:        The GeneXpert MRSA Assay (FDA approved for NASAL specimens only), is one component of a comprehensive MRSA colonization surveillance program. It is not intended to diagnose MRSA infection nor to guide or monitor treatment for MRSA infections. RESULT CALLED TO, READ BACK BY AND VERIFIED WITH: RN Leron Croak [phone] [id number] Performed at [facility] [address] [place], [place] [address]     Radiology Reports Dg Abd 1 View  Result Date: 10/06/2017 CLINICAL DATA:  Orogastric tube placement. EXAM: ABDOMEN - 1 VIEW COMPARISON:  06/01/2016. FINDINGS: Orogastric tube tip in the proximal stomach and side hole in the proximal to mid stomach. Gaseous distention of the stomach. Again demonstrated are 3 large gallstones in the gallbladder measuring up to 2.6 cm in maximum diameter each. No bowel gas visualized except for  the gas in the stomach. Patchy opacity in the left lower lobe. Lumbar spine degenerative changes and minimal scoliosis. IMPRESSION: 1. Gaseous distention of the stomach with no other bowel gas seen. This is concerning for gastric outlet obstruction. 2. Cholelithiasis. 3. Patchy left lower lobe atelectasis or pneumonia. Electronically Signed   By: Claudie Revering M.D.   On: 10/06/2017 19:55   US Renal  Result Date: 10/06/2017 CLINICAL DATA:  67 year old male with acute renal failure. EXAM: RENAL / URINARY TRACT ULTRASOUND COMPLETE COMPARISON:  10/06/2016 ultrasound and 03/06/2017 MR FINDINGS: Right Kidney: Length: 12.1 cm. Diffuse increased echogenicity noted. No solid mass or hydronephrosis noted. Left Kidney: Length: 12.4 cm. Diffuse increased echogenicity noted. No solid mass or hydronephrosis identified. A 1.5 cm cyst is again identified. Bladder: A Foley catheter is present within a collapsed bladder. Incidental note is made of ascites. IMPRESSION: 1. Echogenic kidneys bilaterally compatible with medical renal disease. No evidence of hydronephrosis. 2. Ascites Electronically Signed   By: Margarette Canada M.D.   On: 10/06/2017 16:41   Dg Chest Port 1 View  Result Date: 10/09/2017 CLINICAL DATA:  Shortness of breath EXAM: PORTABLE CHEST 1 VIEW COMPARISON:  10/08/2017 FINDINGS: Cardiac shadow remains mildly enlarged. Endotracheal tube, nasogastric catheter and left jugular temporary dialysis catheter are again seen and stable. The lungs are hypoinflated. Some persistent left retrocardiac density is noted. No new focal abnormality is noted. IMPRESSION: No significant change from the previous day. Electronically Signed   By: Inez Catalina M.D.   On: 10/09/2017 07:10   Dg Chest Port 1 View  Result Date: 10/08/2017 CLINICAL DATA:  Endotracheal tube placement. EXAM: PORTABLE CHEST 1 VIEW COMPARISON:  Oct 07, 2017 FINDINGS: Mediastinal contour is stable. The heart size is enlarged. Endotracheal tube is identified  distal tip 4.9 cm from carina. A left central venous line  is identified distal tip in the superior vena cava. A nasogastric tube is noted with distal tip not included on film but is probably in the stomach. Mild patchy opacities identified in the left lung base. There is no pulmonary edema or pleural effusion. The visualized skeletal structures are stable. IMPRESSION: Life supporting devices as described. Endotracheal tube in good position. Cardiomegaly. Patchy opacity of the medial left lung base probably due to atelectasis but superimposed pneumonia is not excluded. Electronically Signed   By: Abelardo Diesel M.D.   On: 10/08/2017 08:03   Dg Chest Port 1 View  Result Date: 10/07/2017 CLINICAL DATA:  Respiratory failure EXAM: PORTABLE CHEST 1 VIEW COMPARISON:  Oct 06, 2017 FINDINGS: The ETT, left central line, and NG tube are again identified and stable within visualize limits. No pneumothorax. Stable cardiomegaly. The hila and mediastinum are unchanged. Mild opacity in the medial right lung base may represent atelectasis, vascular crowding, or subtle infiltrate. Recommend attention on follow-up. No other acute abnormalities. IMPRESSION: 1. Stable support apparatus as above. 2. Mild increased opacity in the medial right lung base could represent vascular crowding, atelectasis, or subtle infiltrate. Recommend attention on follow-up. Electronically Signed   By: Dorise Bullion III M.D   On: 10/07/2017 07:47   Dg Chest Port 1 View  Result Date: 10/06/2017 CLINICAL DATA:  Intubated. EXAM: PORTABLE CHEST 1 VIEW COMPARISON:  Earlier today. FINDINGS: Endotracheal tube tip 3.6 cm above the carina. Left jugular catheter unchanged. Stable enlarged cardiac silhouette and prominent interstitial markings. Mild increase in patchy opacity in the left lower lobe. Minimal right basilar atelectasis with improvement. No acute bony abnormality. IMPRESSION: 1. Endotracheal tube tip 3.5 cm above the carina. This could be retracted  3.5 cm to place it at the level of the clavicles. 2. Mildly increased patchy atelectasis or pneumonia in the left lower lobe. 3. Minimal right basilar atelectasis with improvement. 4. Stable cardiomegaly and chronic interstitial lung disease. Electronically Signed   By: Claudie Revering M.D.   On: 10/06/2017 19:53   Dg Chest Port 1 View  Result Date: 10/06/2017 CLINICAL DATA:  Encounter for central line placement. EXAM: PORTABLE CHEST 1 VIEW COMPARISON:  Radiograph of same day. FINDINGS: Stable cardiomegaly. Hypoinflation of the lungs is noted with mild bibasilar subsegmental atelectasis. Interval placement of left internal jugular dialysis catheter with distal tip in expected position of the SVC. No pneumothorax or significant pleural effusion is noted. Bony thorax is unremarkable. IMPRESSION: Interval placement of left internal jugular dialysis catheter with distal tip in expected position of SVC. No pneumothorax is noted. Hypoinflation of the lungs with mild bibasilar subsegmental atelectasis. Electronically Signed   By: Marijo Conception, M.D.   On: 10/06/2017 15:23   Dg Chest Portable 1 View  Result Date: 10/06/2017 CLINICAL DATA:  67 year old male with a history of hypotension EXAM: PORTABLE CHEST 1 VIEW COMPARISON:  05/23/2017, 10/08/2016 FINDINGS: Cardiomediastinal silhouette unchanged, likely accentuated by apical lordotic positioning. Persisting cardiomegaly. No pneumothorax.  No large pleural effusion.  Low lung volumes. IMPRESSION: Redemonstration of cardiomegaly. The accentuated appearance is favored to be secondary to the apical lordotic positioning, however, new pericardial effusion not excluded. If there is concern for pericardial effusion, recommend correlation with ECHO. No evidence of lobar pneumonia or overt edema. Electronically Signed   By: Corrie Mckusick D.O.   On: 10/06/2017 12:15   Dg Abd Portable 1v  Result Date: 10/08/2017 CLINICAL DATA:  Gastric outlet obstruction EXAM: PORTABLE  ABDOMEN - 1 VIEW COMPARISON:  Portable  exam 1118 hours compared to 10/06/2017 FINDINGS: Nasogastric tube coiled in stomach, which is decompressed. Air-filled loops of nonspecific small bowel mid abdomen. No bowel wall thickening. Paucity of colonic gas. Multiple calcified gallstones RIGHT upper quadrant. Bones demineralized. IMPRESSION: Cholelithiasis. Decompressed stomach versus prior exam. Nonspecific mildly dilated air-filled loops of small bowel in the mid abdomen with paucity of colonic gas, nonspecific, could reflect ileus or even potentially small-bowel obstruction; further imaging as clinically warranted. Electronically Signed   By: Lavonia Dana M.D.   On: 10/08/2017 11:29    Lab Data:  CBC: Recent Labs  Lab 10/14/17 0256 10/15/17 0434 10/16/17 0416 10/17/17 0310 10/18/17 0513  WBC 5.7 4.1 4.2 4.3 4.8  HGB 9.4* 9.2* 9.3* 9.2* 9.2*  HCT 29.8* 29.5* 29.1* 29.0* 29.6*  MCV 86.6 87.3 87.7 87.3 88.1  PLT 86* 78* 94* 58* 66*   Basic Metabolic Panel: Recent Labs  Lab 10/13/17 0340 10/14/17 0245 10/14/17 0256 10/15/17 0431 10/15/17 0434 10/16/17 0416 10/17/17 0310 10/18/17 0513  NA 135 135  --  135  --  136 136 136  K 3.5 3.6  --  4.0  --  4.3 3.9 4.1  CL 97* 97*  --  98*  --  97* 98* 98*  CO2 29 27  --  28  --  27 28 27   GLUCOSE 100* 82  --  82  --  90 82 82  BUN 25* 32*  --  23*  --  31* 18 31*  CREATININE 4.41* 5.33*  --  3.95*  --  4.74* 3.42* 4.52*  CALCIUM 8.3* 8.6*  --  8.4*  --  8.8* 8.6* 8.7*  MG 1.9  --  1.9  --  1.9 1.9 1.7  --   PHOS 3.2 4.0  --  3.1  --  3.6 2.8 3.6   GFR: Estimated Creatinine Clearance: 15.9 mL/min (A) (by C-G formula based on SCr of 4.52 mg/dL (H)). Liver Function Tests: Recent Labs  Lab 10/14/17 0245 10/15/17 0431 10/16/17 0416 10/17/17 0310 10/18/17 0513  ALBUMIN 2.3* 2.3* 2.3* 2.3* 2.3*   No results for input(s): LIPASE, AMYLASE in the last 168 hours. No results for input(s): AMMONIA in the last 168 hours. Coagulation  Profile: Recent Labs  Lab 10/17/17 1345  INR 1.47   Cardiac Enzymes: No results for input(s): CKTOTAL, CKMB, CKMBINDEX, TROPONINI in the last 168 hours. BNP (last 3 results) No results for input(s): PROBNP in the last 8760 hours. HbA1C: No results for input(s): HGBA1C in the last 72 hours. CBG: Recent Labs  Lab 10/15/17 1954 10/15/17 2340 10/16/17 0318 10/16/17 2013 10/16/17 2306  GLUCAP 89 105* 73 87 81   Lipid Profile: No results for input(s): CHOL, HDL, LDLCALC, TRIG, CHOLHDL, LDLDIRECT in the last 72 hours. Thyroid Function Tests: No results for input(s): TSH, T4TOTAL, FREET4, T3FREE, THYROIDAB in the last 72 hours. Anemia Panel: No results for input(s): VITAMINB12, FOLATE, FERRITIN, TIBC, IRON, RETICCTPCT in the last 72 hours. Urine analysis:    Component Value Date/Time   COLORURINE RED (A) 10/06/2017 1101   APPEARANCEUR TURBID (A) 10/06/2017 1101   LABSPEC  10/06/2017 1101    TEST NOT REPORTED DUE TO COLOR INTERFERENCE OF URINE PIGMENT   PHURINE  10/06/2017 1101    TEST NOT REPORTED DUE TO COLOR INTERFERENCE OF URINE PIGMENT   GLUCOSEU (A) 10/06/2017 1101    TEST NOT REPORTED DUE TO COLOR INTERFERENCE OF URINE PIGMENT   HGBUR (A) 10/06/2017 1101    TEST NOT REPORTED  DUE TO COLOR INTERFERENCE OF URINE PIGMENT   HGBUR negative 12/26/2008 1541   BILIRUBINUR (A) 10/06/2017 1101    TEST NOT REPORTED DUE TO COLOR INTERFERENCE OF URINE PIGMENT   KETONESUR (A) 10/06/2017 1101    TEST NOT REPORTED DUE TO COLOR INTERFERENCE OF URINE PIGMENT   PROTEINUR (A) 10/06/2017 1101    TEST NOT REPORTED DUE TO COLOR INTERFERENCE OF URINE PIGMENT   UROBILINOGEN 1.0 04/03/2015 1725   NITRITE (A) 10/06/2017 1101    TEST NOT REPORTED DUE TO COLOR INTERFERENCE OF URINE PIGMENT   LEUKOCYTESUR (A) 10/06/2017 1101    TEST NOT REPORTED DUE TO COLOR INTERFERENCE OF URINE PIGMENT     Dema Timmons M.D. [facility] 10/18/2017, 10:29 AM  Pager: [phone] Between 7am to 7pm - call  Pager - [phone]  After 7pm go to [email or web address] - password [email or web address]  Call night coverage person covering after 7pm

## 2017-10-18 NOTE — Plan of Care (Signed)
Continue currrent care plan

## 2017-10-18 NOTE — Progress Notes (Signed)
[facility] Rounding Note  Background 67 y.o. yo male NHP, wheelchair bound, PMH CAD, HTN, HLD, BPH, CM with EF 35%, RV and LV dysfunction,  baseline CKD3 (creatinine 1.48 - 1.78 and on losartan outpt). Admitted 10/06/2017 with creatinine 14, AMS -> urosepsis and AKI (ATN felt 2/2 septic shock). CRRT 5/10-5/13 ([place] cath 5/10) then transitioned to IHD 5/16, dialysis dependent since. Renal US 5/10 12.1, 12.4 echodense. Of note had chronic indwelling foley since May 2018 due to inability to empty bladder, removed this admission, doing daily bladder scans to monitor for UOP  Assessment/Plan:  1. AKI on CKD3 - 2/2 ATN/septic shock from enterobacter urosepsis. CRRT 5/10-5/13, IHD 5/16, 5/18 and 5/20. No evidence return of renal function yet. HD today. 1. IR to do TDC this week 2. We have not made plans for HD on the outside for him as still hoping for some recovery.  3. Had indwelling foley since May 2018 (removed this admission)  for inability to pass urine on his own (sounds like BPH). Doing daily bladder scans now as this issue could mask renal recovery. (but no sig urine yet) 4. Too early to call permanent dialysis dependence. 2. Urosepsis- enterobacter- meropenem finished 5/18.  3. Anemia- on ESA- no iron needed.  Transfused 5/16. Gets Aranesp 200 QMonday  4. Thrombocytopenia - acute on chronic. Has been attributed to sepsis, but down again today. Has been getting tight heparin with HD. I did not see that HIT panel has been done. Pending, in the interim will avoid use of heparin with next HD. Citrate catheter lock. 5. PAF/bradycardia - no anticoag at present 2/2 low plts  Jamal Maes, MD [facility] [phone] Pager 10/18/2017, 10:58 AM   Subjective: No urine on bladders scans yet (doing daily) IR working out [facility] placement HD today  Objective Vital signs in last 24 hours: Vitals:   10/17/17 2342 10/18/17 0352 10/18/17 0725 10/18/17 1000  BP:  139/69 118/74 (!) (P) 119/52  Pulse:  87 80 80 (P) 80  Resp: (!) 24 20 (!) 21 (P) 14  Temp: 98.2 F (36.8 C) 98.2 F (36.8 C) 97.7 F (36.5 C)   TempSrc: Oral Oral Oral   SpO2: 94% 96% 97%   Weight:  86.1 kg (189 lb 13.1 oz)    Height:       Weight change: -4 kg (-8 lb 13.1 oz)  Intake/Output Summary (Last 24 hours) at 10/18/2017 1055 Last data filed at 10/17/2017 2200 Gross per 24 hour  Intake 480 ml  Output 120 ml  Net 360 ml   Physical Exam: Very nice man Weight 86.1 (max weight prior to start of HD around 93 kg) Lungs with decr BS bases and some rales S1S2 No S3 1-2/6 murmur LSB Abd soft Colostomy bag, brown stool 2+ dependent pitting edema LE's up to the hips  L IJ vas cath in place (5/10)   Recent Labs  Lab 10/16/17 0416 10/17/17 0310 10/18/17 0513  NA 136 136 136  K 4.3 3.9 4.1  CL 97* 98* 98*  CO2 27 28 27   GLUCOSE 90 82 82  BUN 31* 18 31*  CREATININE 4.74* 3.42* 4.52*  CALCIUM 8.8* 8.6* 8.7*  PHOS 3.6 2.8 3.6    Recent Labs  Lab 10/16/17 0416 10/17/17 0310 10/18/17 0513  ALBUMIN 2.3* 2.3* 2.3*    Recent Labs  Lab 10/14/17 0256 10/15/17 0434 10/16/17 0416 10/17/17 0310 10/18/17 0513  WBC 5.7 4.1 4.2 4.3 4.8  HGB 9.4* 9.2* 9.3* 9.2* 9.2*  HCT 29.8* 29.5* 29.1* 29.0* 29.6*  MCV 86.6 87.3 87.7 87.3 88.1  PLT 86* 78* 94* 58* 66*   CBG: Recent Labs  Lab 10/15/17 1954 10/15/17 2340 10/16/17 0318 10/16/17 2013 10/16/17 2306  GLUCAP 89 105* 73 87 81   Lab Results  Component Value Date   IRON 45 10/11/2017   TIBC 148 (L) 10/11/2017   FERRITIN 686 (H) 10/11/2017        Transferrin saturation                                   30%  Scheduled Medications: . Chlorhexidine Gluconate Cloth  6 each Topical Daily  . darbepoetin (ARANESP) injection - NON-DIALYSIS  200 mcg Subcutaneous Q Mon-1800  . feeding supplement (NEPRO CARB STEADY)  237 mL Oral TID BM  . pantoprazole  40 mg Oral Daily  . sodium chloride flush  10-40 mL Intracatheter Q12H  . sodium chloride flush  3 mL  Intravenous Q12H   . sodium chloride    . sodium chloride    . sodium chloride    .  ceFAZolin (ANCEF) IV     Jamal Maes, MD [place] Pager 10/18/2017, 10:55 AM

## 2017-10-18 NOTE — Progress Notes (Signed)
Called to bring pt down for HD cath removal. Pt in dialysis at this time. Will attempt later

## 2017-10-18 NOTE — Progress Notes (Signed)
Pharmacy Heparin Induced Thrombocytopenia (HIT) Note:  BLAS RICHES is a 67 y.o. male being evaluated for HIT. Heparin was started 5/13 for HD, and baseline platelets were 47. HIT labs were ordered on 5/21 when platelets dropped to 58.   Heparin Induced Plt Ab  Date/Time Value Ref Range Status  10/17/2017 01:45 PM 0.465 (H) 0.000 - 0.400 OD Final    Comment:    (NOTE) Performed At: [facility] [place], [place] [id number] Rush Farmer MD [id number] Performed at [facility], [place] [address] [place], [place] [address]     Name of MD Contacted: Dr Jamal Maes  Plan: Heparin is currently being held during HD and sodium citrate is being used instead. Will order SRA to get definite answer on heparin allergy.  Doylene Canard, PharmD Clinical Pharmacist  Pager: [phone] Phone: [phone]

## 2017-10-19 LAB — RENAL FUNCTION PANEL
Albumin: 2.2 g/dL — ABNORMAL LOW (ref 3.5–5.0)
Anion gap: 9 (ref 5–15)
BUN: 18 mg/dL (ref 6–20)
CALCIUM: 8.4 mg/dL — AB (ref 8.9–10.3)
CO2: 30 mmol/L (ref 22–32)
CREATININE: 3.17 mg/dL — AB (ref 0.61–1.24)
Chloride: 97 mmol/L — ABNORMAL LOW (ref 101–111)
GFR calc Af Amer: 21 mL/min — ABNORMAL LOW (ref >60)
GFR, EST NON AFRICAN AMERICAN: 19 mL/min — AB (ref >60)
GLUCOSE: 88 mg/dL (ref 65–99)
Phosphorus: 3.3 mg/dL (ref 2.5–4.6)
Potassium: 3.5 mmol/L (ref 3.5–5.1)
SODIUM: 136 mmol/L (ref 135–145)

## 2017-10-19 MED ORDER — ANTICOAGULANT SODIUM CITRATE 4% (200MG/5ML) IV SOLN
5.0000 mL | Status: AC | PRN
  Filled 2017-10-19: qty 250

## 2017-10-19 MED ORDER — CHLORHEXIDINE GLUCONATE CLOTH 2 % EX PADS
6.0000 | MEDICATED_PAD | Freq: Every day | CUTANEOUS | Status: DC
  Administered 2017-10-19 – 2017-10-24 (×5): 6 via TOPICAL

## 2017-10-19 NOTE — Progress Notes (Signed)
[facility] Rounding Note  Background 67 y.o. yo male NHP, wheelchair bound, PMH CAD, HTN, HLD, BPH, CM with EF 35%, RV and LV dysfunction,  baseline CKD3 (creatinine 1.48 - 1.78 and on losartan outpt). Admitted 10/06/2017 with creatinine 14, AMS -> urosepsis and AKI (ATN felt 2/2 septic shock). 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 Renal US 5/10 12.1, 12.4 echodense. Of note had chronic indwelling foley since May 2018 due to inability to empty bladder, removed this admission, doing daily bladder scans to monitor for UOP. 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 AKI on CKD3 - 2/2 ATN/septic shock from enterobacter urosepsis. CRRT 5/10-5/13, IHD 5/16, 5/18, 5/20, 5/22. No evidence return of renal function yet. Remains anuric by bladder scans  1. We have not made plans for HD on the outside for him as still hoping for some recovery.  2. Indwelling foley since May 2018 (removed this admission)  for inability to pass urine on his own (sounds like BPH). Doing daily bladder scans now as this inability to void issue could mask renal recovery. (but no sig urine yet) 3. Too early to call permanent dialysis dependence but continued anuria worrisome 4. Next HD 5/24  2. Urosepsis- enterobacter- meropenem finished 5/18.  3. Anemia- on ESA- no iron needed.  Transfused 5/16. Gets Aranesp 200 QMonday  4. Thrombocytopenia - acute on chronic. Had been attributed to sepsis but persistently low. Had been getting tight heparin with HD. 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 In the meantime no heparin, citrate block for TDC. 5. PAF/bradycardia - no anticoag at present 2/2 low plts  Jamal Maes, MD [place] Pager 10/19/2017, 7:18 AM   Subjective: HD + TDC (IR 5/22) yesterday weight 86.8->83.4. Still no urine on bladder scans so no need for foley replacement yet  Objective Vital signs in last 24  hours: Vitals:   10/18/17 1620 10/18/17 2019 10/18/17 2335 10/19/17 0327  BP: (!) 143/87 115/73 106/71 (!) 107/59  Pulse: 86 88 78 79  Resp: (!) 26 18 17  (!) 21  Temp: 98.3 F (36.8 C) 98.3 F (36.8 C) 98.5 F (36.9 C) 98.5 F (36.9 C)  TempSrc: Oral Oral Oral Oral  SpO2: 96% 99% 95% 99%  Weight:    85.5 kg (188 lb 7.9 oz)  Height:       Weight change: 0.7 kg (1 lb 8.7 oz)  Intake/Output Summary (Last 24 hours) at 10/19/2017 0718 Last data filed at 10/19/2017 0700 Gross per 24 hour  Intake 697 ml  Output 3140 ml  Net -2443 ml   Physical Exam: Very nice man Weight post H 83.4. (max weight prior to start of HD around 93 kg) Lungs with decr BS bases and some rales S1S2 No S3 1-2/6 murmur LSB Abd soft Colostomy bag, brown stool, hernia 1-2+ dependent pitting edema LE's up to the hips some improved TDC R IJ (5/22 IR) L temp cath out   Recent Labs  Lab 10/17/17 0310 10/18/17 0513 10/19/17 0300  NA 136 136 136  K 3.9 4.1 3.5  CL 98* 98* 97*  CO2 28 27 30   GLUCOSE 82 82 88  BUN 18 31* 18  CREATININE 3.42* 4.52* 3.17*  CALCIUM 8.6* 8.7* 8.4*  PHOS 2.8 3.6 3.3    Recent Labs  Lab 10/17/17 0310 10/18/17 0513 10/19/17 0300  ALBUMIN 2.3* 2.3* 2.2*    Recent Labs  Lab 10/14/17 0256 10/15/17  6962 10/16/17 0416 10/17/17 0310 10/18/17 0513  WBC 5.7 4.1 4.2 4.3 4.8  HGB 9.4* 9.2* 9.3* 9.2* 9.2*  HCT 29.8* 29.5* 29.1* 29.0* 29.6*  MCV 86.6 87.3 87.7 87.3 88.1  PLT 86* 78* 94* 58* 66*    Recent Labs  Lab 10/15/17 1954 10/15/17 2340 10/16/17 0318 10/16/17 2013 10/16/17 2306  GLUCAP 89 105* 73 87 81   Lab Results  Component Value Date   IRON 45 10/11/2017   TIBC 148 (L) 10/11/2017   FERRITIN 686 (H) 10/11/2017        Transferrin saturation                                   30%  Scheduled Medications: . Chlorhexidine Gluconate Cloth  6 each Topical Daily  . darbepoetin (ARANESP) injection - NON-DIALYSIS  200 mcg Subcutaneous Q Mon-1800  . feeding  supplement (NEPRO CARB STEADY)  237 mL Oral TID BM  . pantoprazole  40 mg Oral Daily  . sodium chloride flush  10-40 mL Intracatheter Q12H  . sodium chloride flush  3 mL Intravenous Q12H   . sodium chloride    . sodium chloride    . sodium chloride    . anticoagulant sodium citrate

## 2017-10-19 NOTE — Progress Notes (Signed)
[facility]                                                                              Patient Demographics  Patrick [name], is a 67 y.o. male, DOB - 06/10/1950, YNW:[id number]  Admit date - 10/06/2017   Admitting Physician Chesley Mires, MD  Outpatient Primary MD for the patient is Patient, [name]  Outpatient specialists:   LOS - 13  days   Medical records reviewed and are as summarized below:    Chief Complaint  Patient presents with  . Abnormal Lab       Brief summary   67 yo M SNF resident w/ a Hx CAD, Chronic Systolic CHF (EF 65-78%), remote Colon Cancer w/ perm end colostomy in 2018, CKD Stage III-IV, SBO, and an enlarged prostate who presented to the ED 5/10 after being found w/ altered LOC and several lab abnormalities including cr 13.35 and K 5.56m Hgb 7.5, and hypoglycemia. He was also hypotensive w/ SBP in 70s. He initially underwent CRRT. Now getting intermittent dialysis. 5/10 admit - transfused 1U PRBC - Vascath placed - CRRT initiated 5/13 CRRT stopped  5/11 TTE - diffuse hypokinesis - EF 35-40% - mild mitral regurg 5/16 IHD Patient was transferred to hospitalist service on 10/11/2017  Assessment & Plan    Acute on chronic CKD stage III/IV -Presented with creatinine of 14.4, baseline creatinine 1.6.  Likely due to ATN in the setting of septic shock -Nephrology was consulted and was placed on CRRT. -Currently on intermittent hemodialysis, last hemodialysis on 5/20.  -Right IJ HD catheter placed on 5/22 by IR -Awaiting further recommendations from nephrology regarding hemodialysis, continues to remain anuric  Enterobacter UTI -Finished meropenem on 5/18   Anemia of chronic disease -Likely due to CKD, transfused on 5/16, on Aranesp weekly -Has received 4 units of packed RBCs this admission.  -Obtain CBC  Thrombocytopenia, acute on chronic, possibly worsened due to sepsis -DIC panel equivocal, unlikely TTP, negative schistocytes. - no  heparin products, SRA test in process to rule out HIT -If positive, will place on arixtra or argatroban   Acute metabolic encephalopathy likely due to #1 - improving  Hypovolemic shock -Likely due to #1, resolved  Paroxysmal atrial fibrillation, bradycardia with frequent pauses - Not on anticoagulation due to thrombocytopenia, was on apixaban previously  History of colon CA status post colostomy  Code Status: partial  DVT Prophylaxis:   SCD's Family Communication: Discussed in detail with the patient, all imaging results, lab results explained to the patient    Disposition Plan:  Awaiting final recommendations from renal   Time Spent in minutes   25 minutes  Procedures:  5/13 CRRT stopped  5/11 TTE - diffuse hypokinesis - EF 35-40% - mild mitral regurg 5/16 IHD  Consultants:   PCCM Nephrology   Antimicrobials:      Medications  Scheduled Meds: . Chlorhexidine Gluconate Cloth  6 each Topical Daily  . Chlorhexidine Gluconate Cloth  6 each Topical Q0600  . darbepoetin (ARANESP) injection - NON-DIALYSIS  200 mcg Subcutaneous Q Mon-1800  . feeding supplement (NEPRO CARB STEADY)  237 mL Oral TID BM  .  pantoprazole  40 mg Oral Daily  . sodium chloride flush  10-40 mL Intracatheter Q12H  . sodium chloride flush  3 mL Intravenous Q12H   Continuous Infusions: . sodium chloride    . sodium chloride    . sodium chloride    . anticoagulant sodium citrate     PRN Meds:.sodium chloride, sodium chloride, sodium chloride, alteplase, baclofen, lidocaine (PF), lidocaine-prilocaine, oxyCODONE-acetaminophen, pentafluoroprop-tetrafluoroeth, sodium chloride flush, sodium chloride flush   Antibiotics   Anti-infectives (From admission, onward)   Start     Dose/Rate Route Frequency Ordered Stop   10/18/17 1321  ceFAZolin (ANCEF) 2-4 GM/100ML-% IVPB    Note to Pharmacy:  Cecile Sheerer   : cabinet override      10/18/17 1321 10/19/17 0129   10/18/17 0800  ceFAZolin (ANCEF)  IVPB 2g/100 mL premix     2 g 200 mL/hr over 30 Minutes Intravenous On call 10/17/17 1154 10/18/17 1400   10/10/17 2200  meropenem (MERREM) 500 mg in sodium chloride 0.9 % 100 mL IVPB     500 mg 200 mL/hr over 30 Minutes Intravenous Every 24 hours 10/10/17 1022 10/14/17 2235   10/08/17 1200  meropenem (MERREM) 1 g in sodium chloride 0.9 % 100 mL IVPB  Status:  Discontinued     1 g 200 mL/hr over 30 Minutes Intravenous Every 12 hours 10/08/17 1050 10/10/17 1022   10/07/17 1600  vancomycin (VANCOCIN) IVPB 1000 mg/200 mL premix  Status:  Discontinued     1,000 mg 200 mL/hr over 60 Minutes Intravenous Every 24 hours 10/06/17 2007 10/08/17 1050   10/07/17 0400  piperacillin-tazobactam (ZOSYN) IVPB 3.375 g  Status:  Discontinued     3.375 g 100 mL/hr over 30 Minutes Intravenous Every 6 hours 10/06/17 2313 10/08/17 1050   10/06/17 2000  piperacillin-tazobactam (ZOSYN) IVPB 3.375 g  Status:  Discontinued     3.375 g 12.5 mL/hr over 240 Minutes Intravenous Every 6 hours 10/06/17 1955 10/06/17 2313   10/06/17 1615  vancomycin (VANCOCIN) 2,000 mg in sodium chloride 0.9 % 500 mL IVPB     2,000 mg 250 mL/hr over 120 Minutes Intravenous  Once 10/06/17 1602 10/06/17 1815   10/06/17 1600  piperacillin-tazobactam (ZOSYN) IVPB 3.375 g     3.375 g 100 mL/hr over 30 Minutes Intravenous  Once 10/06/17 1602 10/06/17 1706   10/06/17 1130  cefTRIAXone (ROCEPHIN) 1 g in sodium chloride 0.9 % 100 mL IVPB  Status:  Discontinued     1 g 200 mL/hr over 30 Minutes Intravenous Every 24 hours 10/06/17 1101 10/06/17 1516   10/06/17 0400  piperacillin-tazobactam (ZOSYN) IVPB 3.375 g  Status:  Discontinued     3.375 g 12.5 mL/hr over 240 Minutes Intravenous Every 12 hours 10/06/17 1602 10/06/17 1955        Subjective:   Patrick [name] was seen and examined today.  Sitting in the chair, denies any specific complaints, no significant complaints.  No acute issues overnight.  Patient denies dizziness, chest pain, abdominal  pain, N/V.   Objective:   Vitals:   10/18/17 1620 10/18/17 2019 10/18/17 2335 10/19/17 0327  BP: (!) 143/87 115/73 106/71 (!) 107/59  Pulse: 86 88 78 79  Resp: (!) 26 18 17  (!) 21  Temp: 98.3 F (36.8 C) 98.3 F (36.8 C) 98.5 F (36.9 C) 98.5 F (36.9 C)  TempSrc: Oral Oral Oral Oral  SpO2: 96% 99% 95% 99%  Weight:    85.5 kg (188 lb 7.9 oz)  Height:  Intake/Output Summary (Last 24 hours) at 10/19/2017 1043 Last data filed at 10/19/2017 0700 Gross per 24 hour  Intake 697 ml  Output 3090 ml  Net -2393 ml     Wt Readings from Last 3 Encounters:  10/19/17 85.5 kg (188 lb 7.9 oz)  08/29/17 97.5 kg (215 lb)  05/26/17 93.7 kg (206 lb 9.6 oz)     Exam    General: Alert and oriented x 3, NAD  Eyes:   HEENT:  Atraumatic, normocephalic  Cardiovascular: S1 S2 auscultated, RRR. 2+ pedal edema b/l  Respiratory: Decreased breath sound at the bases  Gastrointestinal: Soft, nontender, nondistended, + bowel sounds, colostomy bag  Ext: 2+ pedal edema bilaterally  Neuro: no new FND's  Musculoskeletal: No digital cyanosis, clubbing  Skin: No rashes  Psych: Normal affect and demeanor, alert and oriented x3    Data Reviewed:  I have personally reviewed following labs and imaging studies  Micro Results Recent Results (from the past 240 hour(s))  MRSA PCR Screening     Status: Abnormal   Collection Time: 10/09/17 12:20 PM  Result Value Ref Range Status   MRSA by PCR POSITIVE (A) NEGATIVE Final    Comment:        The GeneXpert MRSA Assay (FDA approved for NASAL specimens only), is one component of a comprehensive MRSA colonization surveillance program. It is not intended to diagnose MRSA infection nor to guide or monitor treatment for MRSA infections. RESULT CALLED TO, READ BACK BY AND VERIFIED WITH: RN Leron Croak [id number] Performed at [facility] [address] [place], [place] [address]     Radiology Reports Dg Abd 1 View  Result  Date: 10/06/2017 CLINICAL DATA:  Orogastric tube placement. EXAM: ABDOMEN - 1 VIEW COMPARISON:  06/01/2016. FINDINGS: Orogastric tube tip in the proximal stomach and side hole in the proximal to mid stomach. Gaseous distention of the stomach. Again demonstrated are 3 large gallstones in the gallbladder measuring up to 2.6 cm in maximum diameter each. No bowel gas visualized except for the gas in the stomach. Patchy opacity in the left lower lobe. Lumbar spine degenerative changes and minimal scoliosis. IMPRESSION: 1. Gaseous distention of the stomach with no other bowel gas seen. This is concerning for gastric outlet obstruction. 2. Cholelithiasis. 3. Patchy left lower lobe atelectasis or pneumonia. Electronically Signed   By: Claudie Revering M.D.   On: 10/06/2017 19:55   US Renal  Result Date: 10/06/2017 CLINICAL DATA:  67 year old male with acute renal failure. EXAM: RENAL / URINARY TRACT ULTRASOUND COMPLETE COMPARISON:  10/06/2016 ultrasound and 03/06/2017 MR FINDINGS: Right Kidney: Length: 12.1 cm. Diffuse increased echogenicity noted. No solid mass or hydronephrosis noted. Left Kidney: Length: 12.4 cm. Diffuse increased echogenicity noted. No solid mass or hydronephrosis identified. A 1.5 cm cyst is again identified. Bladder: A Foley catheter is present within a collapsed bladder. Incidental note is made of ascites. IMPRESSION: 1. Echogenic kidneys bilaterally compatible with medical renal disease. No evidence of hydronephrosis. 2. Ascites Electronically Signed   By: Margarette Canada M.D.   On: 10/06/2017 16:41   Ir Fluoro Guide Cv Line Right  Result Date: 10/18/2017 INDICATION: End-stage renal disease EXAM: TUNNELED DIALYSIS CATHETER PLACEMENT, ULTRASOUND GUIDANCE FOR VASCULAR ACCESS MEDICATIONS: Ancef 2 g; The antibiotic was administered within an appropriate time interval prior to skin puncture. ANESTHESIA/SEDATION: Versed 0.5 mg IV; Fentanyl 25 mcg IV; Moderate Sedation Time:  12 minutes The patient was  continuously monitored during the procedure by the interventional  radiology nurse under my direct supervision. FLUOROSCOPY TIME:  Fluoroscopy Time:  minutes  seconds ( mGy). COMPLICATIONS: None immediate. PROCEDURE: Informed written consent was obtained from the patient after a thorough discussion of the procedural risks, benefits and alternatives. All questions were addressed. Maximal Sterile Barrier Technique was utilized including caps, mask, sterile gowns, sterile gloves, sterile drape, hand hygiene and skin antiseptic. A timeout was performed prior to the initiation of the procedure. The right neck was prepped with ChloraPrep in a sterile fashion, and a sterile drape was applied covering the operative field. A sterile gown and sterile gloves were used for the procedure. 1% lidocaine into the skin and subcutaneous tissue. The right jugular vein was noted to be patent initially with ultrasound. Under sonographic guidance, a micropuncture needle was inserted into the right IJ vein (Ultrasound and fluoroscopic image documentation was performed). It was removed over an 018 wire which was up-sized to an Amplatz. This was advanced into the IVC. A small incision was made in the right upper chest. The tunneling device was utilized to advance the 23 centimeter tip to cuff catheter from the chest incision and out the neck incision. A peel-away sheath was advanced over the Amplatz wire. The leading edge of the catheter was then advanced through the peel-away sheath. The peel-away sheath was removed. It was flushed and instilled with heparin. The chest incision was closed with a 0 Prolene pursestring stitch. The neck incision was closed with a 4-0 Vicryl subcuticular stitch. IMPRESSION: Successful right IJ vein tunneled dialysis catheter with its tip in the right atrium. Electronically Signed   By: Marybelle Killings M.D.   On: 10/18/2017 14:53   Ir US Guide Vasc Access Right  Result Date: 10/18/2017 INDICATION: End-stage  renal disease EXAM: TUNNELED DIALYSIS CATHETER PLACEMENT, ULTRASOUND GUIDANCE FOR VASCULAR ACCESS MEDICATIONS: Ancef 2 g; The antibiotic was administered within an appropriate time interval prior to skin puncture. ANESTHESIA/SEDATION: Versed 0.5 mg IV; Fentanyl 25 mcg IV; Moderate Sedation Time:  12 minutes The patient was continuously monitored during the procedure by the interventional radiology nurse under my direct supervision. FLUOROSCOPY TIME:  Fluoroscopy Time:  minutes  seconds ( mGy). COMPLICATIONS: None immediate. PROCEDURE: Informed written consent was obtained from the patient after a thorough discussion of the procedural risks, benefits and alternatives. All questions were addressed. Maximal Sterile Barrier Technique was utilized including caps, mask, sterile gowns, sterile gloves, sterile drape, hand hygiene and skin antiseptic. A timeout was performed prior to the initiation of the procedure. The right neck was prepped with ChloraPrep in a sterile fashion, and a sterile drape was applied covering the operative field. A sterile gown and sterile gloves were used for the procedure. 1% lidocaine into the skin and subcutaneous tissue. The right jugular vein was noted to be patent initially with ultrasound. Under sonographic guidance, a micropuncture needle was inserted into the right IJ vein (Ultrasound and fluoroscopic image documentation was performed). It was removed over an 018 wire which was up-sized to an Amplatz. This was advanced into the IVC. A small incision was made in the right upper chest. The tunneling device was utilized to advance the 23 centimeter tip to cuff catheter from the chest incision and out the neck incision. A peel-away sheath was advanced over the Amplatz wire. The leading edge of the catheter was then advanced through the peel-away sheath. The peel-away sheath was removed. It was flushed and instilled with heparin. The chest incision was closed with a 0 Prolene pursestring  stitch. The  neck incision was closed with a 4-0 Vicryl subcuticular stitch. IMPRESSION: Successful right IJ vein tunneled dialysis catheter with its tip in the right atrium. Electronically Signed   By: Marybelle Killings M.D.   On: 10/18/2017 14:53   Dg Chest Port 1 View  Result Date: 10/09/2017 CLINICAL DATA:  Shortness of breath EXAM: PORTABLE CHEST 1 VIEW COMPARISON:  10/08/2017 FINDINGS: Cardiac shadow remains mildly enlarged. Endotracheal tube, nasogastric catheter and left jugular temporary dialysis catheter are again seen and stable. The lungs are hypoinflated. Some persistent left retrocardiac density is noted. No new focal abnormality is noted. IMPRESSION: No significant change from the previous day. Electronically Signed   By: Inez Catalina M.D.   On: 10/09/2017 07:10   Dg Chest Port 1 View  Result Date: 10/08/2017 CLINICAL DATA:  Endotracheal tube placement. EXAM: PORTABLE CHEST 1 VIEW COMPARISON:  Oct 07, 2017 FINDINGS: Mediastinal contour is stable. The heart size is enlarged. Endotracheal tube is identified distal tip 4.9 cm from carina. A left central venous line is identified distal tip in the superior vena cava. A nasogastric tube is noted with distal tip not included on film but is probably in the stomach. Mild patchy opacities identified in the left lung base. There is no pulmonary edema or pleural effusion. The visualized skeletal structures are stable. IMPRESSION: Life supporting devices as described. Endotracheal tube in good position. Cardiomegaly. Patchy opacity of the medial left lung base probably due to atelectasis but superimposed pneumonia is not excluded. Electronically Signed   By: Abelardo Diesel M.D.   On: 10/08/2017 08:03   Dg Chest Port 1 View  Result Date: 10/07/2017 CLINICAL DATA:  Respiratory failure EXAM: PORTABLE CHEST 1 VIEW COMPARISON:  Oct 06, 2017 FINDINGS: The ETT, left central line, and NG tube are again identified and stable within visualize limits. No pneumothorax.  Stable cardiomegaly. The hila and mediastinum are unchanged. Mild opacity in the medial right lung base may represent atelectasis, vascular crowding, or subtle infiltrate. Recommend attention on follow-up. No other acute abnormalities. IMPRESSION: 1. Stable support apparatus as above. 2. Mild increased opacity in the medial right lung base could represent vascular crowding, atelectasis, or subtle infiltrate. Recommend attention on follow-up. Electronically Signed   By: Dorise Bullion III M.D   On: 10/07/2017 07:47   Dg Chest Port 1 View  Result Date: 10/06/2017 CLINICAL DATA:  Intubated. EXAM: PORTABLE CHEST 1 VIEW COMPARISON:  Earlier today. FINDINGS: Endotracheal tube tip 3.6 cm above the carina. Left jugular catheter unchanged. Stable enlarged cardiac silhouette and prominent interstitial markings. Mild increase in patchy opacity in the left lower lobe. Minimal right basilar atelectasis with improvement. No acute bony abnormality. IMPRESSION: 1. Endotracheal tube tip 3.5 cm above the carina. This could be retracted 3.5 cm to place it at the level of the clavicles. 2. Mildly increased patchy atelectasis or pneumonia in the left lower lobe. 3. Minimal right basilar atelectasis with improvement. 4. Stable cardiomegaly and chronic interstitial lung disease. Electronically Signed   By: Claudie Revering M.D.   On: 10/06/2017 19:53   Dg Chest Port 1 View  Result Date: 10/06/2017 CLINICAL DATA:  Encounter for central line placement. EXAM: PORTABLE CHEST 1 VIEW COMPARISON:  Radiograph of same day. FINDINGS: Stable cardiomegaly. Hypoinflation of the lungs is noted with mild bibasilar subsegmental atelectasis. Interval placement of left internal jugular dialysis catheter with distal tip in expected position of the SVC. No pneumothorax or significant pleural effusion is noted. Bony thorax is unremarkable. IMPRESSION: Interval placement of  left internal jugular dialysis catheter with distal tip in expected position of  SVC. No pneumothorax is noted. Hypoinflation of the lungs with mild bibasilar subsegmental atelectasis. Electronically Signed   By: Marijo Conception, M.D.   On: 10/06/2017 15:23   Dg Chest Portable 1 View  Result Date: 10/06/2017 CLINICAL DATA:  67 year old male with a history of hypotension EXAM: PORTABLE CHEST 1 VIEW COMPARISON:  05/23/2017, 10/08/2016 FINDINGS: Cardiomediastinal silhouette unchanged, likely accentuated by apical lordotic positioning. Persisting cardiomegaly. No pneumothorax.  No large pleural effusion.  Low lung volumes. IMPRESSION: Redemonstration of cardiomegaly. The accentuated appearance is favored to be secondary to the apical lordotic positioning, however, new pericardial effusion not excluded. If there is concern for pericardial effusion, recommend correlation with ECHO. No evidence of lobar pneumonia or overt edema. Electronically Signed   By: Corrie Mckusick D.O.   On: 10/06/2017 12:15   Dg Abd Portable 1v  Result Date: 10/08/2017 CLINICAL DATA:  Gastric outlet obstruction EXAM: PORTABLE ABDOMEN - 1 VIEW COMPARISON:  Portable exam 1118 hours compared to 10/06/2017 FINDINGS: Nasogastric tube coiled in stomach, which is decompressed. Air-filled loops of nonspecific small bowel mid abdomen. No bowel wall thickening. Paucity of colonic gas. Multiple calcified gallstones RIGHT upper quadrant. Bones demineralized. IMPRESSION: Cholelithiasis. Decompressed stomach versus prior exam. Nonspecific mildly dilated air-filled loops of small bowel in the mid abdomen with paucity of colonic gas, nonspecific, could reflect ileus or even potentially small-bowel obstruction; further imaging as clinically warranted. Electronically Signed   By: Lavonia Dana M.D.   On: 10/08/2017 11:29    Lab Data:  CBC: Recent Labs  Lab 10/14/17 0256 10/15/17 0434 10/16/17 0416 10/17/17 0310 10/18/17 0513  WBC 5.7 4.1 4.2 4.3 4.8  HGB 9.4* 9.2* 9.3* 9.2* 9.2*  HCT 29.8* 29.5* 29.1* 29.0* 29.6*  MCV 86.6  87.3 87.7 87.3 88.1  PLT 86* 78* 94* 58* 66*   Basic Metabolic Panel: Recent Labs  Lab 10/13/17 0340  10/14/17 0256 10/15/17 0431 10/15/17 0434 10/16/17 0416 10/17/17 0310 10/18/17 0513 10/19/17 0300  NA 135   < >  --  135  --  136 136 136 136  K 3.5   < >  --  4.0  --  4.3 3.9 4.1 3.5  CL 97*   < >  --  98*  --  97* 98* 98* 97*  CO2 29   < >  --  28  --  27 28 27 30   GLUCOSE 100*   < >  --  82  --  90 82 82 88  BUN 25*   < >  --  23*  --  31* 18 31* 18  CREATININE 4.41*   < >  --  3.95*  --  4.74* 3.42* 4.52* 3.17*  CALCIUM 8.3*   < >  --  8.4*  --  8.8* 8.6* 8.7* 8.4*  MG 1.9  --  1.9  --  1.9 1.9 1.7  --   --   PHOS 3.2   < >  --  3.1  --  3.6 2.8 3.6 3.3   < > = values in this interval not displayed.   GFR: Estimated Creatinine Clearance: 22.6 mL/min (A) (by C-G formula based on SCr of 3.17 mg/dL (H)). Liver Function Tests: Recent Labs  Lab 10/15/17 0431 10/16/17 0416 10/17/17 0310 10/18/17 0513 10/19/17 0300  ALBUMIN 2.3* 2.3* 2.3* 2.3* 2.2*   No results for input(s): LIPASE, AMYLASE in the last 168 hours. No results  for input(s): AMMONIA in the last 168 hours. Coagulation Profile: Recent Labs  Lab 10/17/17 1345  INR 1.47   Cardiac Enzymes: No results for input(s): CKTOTAL, CKMB, CKMBINDEX, TROPONINI in the last 168 hours. BNP (last 3 results) No results for input(s): PROBNP in the last 8760 hours. HbA1C: No results for input(s): HGBA1C in the last 72 hours. CBG: Recent Labs  Lab 10/15/17 1954 10/15/17 2340 10/16/17 0318 10/16/17 2013 10/16/17 2306  GLUCAP 89 105* 73 87 81   Lipid Profile: No results for input(s): CHOL, HDL, LDLCALC, TRIG, CHOLHDL, LDLDIRECT in the last 72 hours. Thyroid Function Tests: No results for input(s): TSH, T4TOTAL, FREET4, T3FREE, THYROIDAB in the last 72 hours. Anemia Panel: No results for input(s): VITAMINB12, FOLATE, FERRITIN, TIBC, IRON, RETICCTPCT in the last 72 hours. Urine analysis:    Component Value  Date/Time   COLORURINE RED (A) 10/06/2017 1101   APPEARANCEUR TURBID (A) 10/06/2017 1101   LABSPEC  10/06/2017 1101    TEST NOT REPORTED DUE TO COLOR INTERFERENCE OF URINE PIGMENT   PHURINE  10/06/2017 1101    TEST NOT REPORTED DUE TO COLOR INTERFERENCE OF URINE PIGMENT   GLUCOSEU (A) 10/06/2017 1101    TEST NOT REPORTED DUE TO COLOR INTERFERENCE OF URINE PIGMENT   HGBUR (A) 10/06/2017 1101    TEST NOT REPORTED DUE TO COLOR INTERFERENCE OF URINE PIGMENT   HGBUR negative 12/26/2008 1541   BILIRUBINUR (A) 10/06/2017 1101    TEST NOT REPORTED DUE TO COLOR INTERFERENCE OF URINE PIGMENT   KETONESUR (A) 10/06/2017 1101    TEST NOT REPORTED DUE TO COLOR INTERFERENCE OF URINE PIGMENT   PROTEINUR (A) 10/06/2017 1101    TEST NOT REPORTED DUE TO COLOR INTERFERENCE OF URINE PIGMENT   UROBILINOGEN 1.0 04/03/2015 1725   NITRITE (A) 10/06/2017 1101    TEST NOT REPORTED DUE TO COLOR INTERFERENCE OF URINE PIGMENT   LEUKOCYTESUR (A) 10/06/2017 1101    TEST NOT REPORTED DUE TO COLOR INTERFERENCE OF URINE PIGMENT     Genita Nilsson M.D. [facility] 10/19/2017, 10:43 AM  Pager: [phone] Between 7am to 7pm - call Pager - [phone]  After 7pm go to [email or web address] - password [email or web address]  Call night coverage person covering after 7pm

## 2017-10-19 NOTE — Progress Notes (Signed)
Pt brought to IR from dialysis for tunneled HD cath. Received call from dialysis nurse at 1310 stating pt's temp cath had been flushed with Heparin. 57mls blood withdrawn and wasted from each port of tem cath. Cath then flushed with saline. Dialysis nurse aware she needs to go flush catheter with Sodium Citrate. States she will go to floor and flush catheter. Pt in no distress while in IR for procedure,

## 2017-10-19 NOTE — Progress Notes (Signed)
Clinical Social Worker following patient for support and placement needs. [facility] has bed for patient once patient is stable for discharge. CSW will continue to follow for needs   Rhea Pink, MSW,  [place]

## 2017-10-19 NOTE — Plan of Care (Signed)
Continue current care plan 

## 2017-10-20 DIAGNOSIS — N185 Chronic kidney disease, stage 5: Secondary | ICD-10-CM

## 2017-10-20 LAB — RENAL FUNCTION PANEL
ALBUMIN: 2.2 g/dL — AB (ref 3.5–5.0)
Anion gap: 11 (ref 5–15)
BUN: 31 mg/dL — ABNORMAL HIGH (ref 6–20)
CALCIUM: 8.5 mg/dL — AB (ref 8.9–10.3)
CHLORIDE: 96 mmol/L — AB (ref 101–111)
CO2: 29 mmol/L (ref 22–32)
CREATININE: 4.3 mg/dL — AB (ref 0.61–1.24)
GFR, EST AFRICAN AMERICAN: 15 mL/min — AB (ref >60)
GFR, EST NON AFRICAN AMERICAN: 13 mL/min — AB (ref >60)
Glucose, Bld: 81 mg/dL (ref 65–99)
Phosphorus: 3.9 mg/dL (ref 2.5–4.6)
Potassium: 3.7 mmol/L (ref 3.5–5.1)
Sodium: 136 mmol/L (ref 135–145)

## 2017-10-20 LAB — CBC WITH DIFFERENTIAL/PLATELET
Abs Immature Granulocytes: 0 10*3/uL (ref 0.0–0.1)
BASOS ABS: 0 10*3/uL (ref 0.0–0.1)
Basophils Relative: 0 %
EOS PCT: 2 %
Eosinophils Absolute: 0.1 10*3/uL (ref 0.0–0.7)
HCT: 28.7 % — ABNORMAL LOW (ref 39.0–52.0)
HEMOGLOBIN: 8.9 g/dL — AB (ref 13.0–17.0)
Immature Granulocytes: 1 %
LYMPHS PCT: 12 %
Lymphs Abs: 0.4 10*3/uL — ABNORMAL LOW (ref 0.7–4.0)
MCH: 27.3 pg (ref 26.0–34.0)
MCHC: 31 g/dL (ref 30.0–36.0)
MCV: 88 fL (ref 78.0–100.0)
MONO ABS: 0.7 10*3/uL (ref 0.1–1.0)
MONOS PCT: 22 %
Neutro Abs: 2 10*3/uL (ref 1.7–7.7)
Neutrophils Relative %: 63 %
Platelets: 86 10*3/uL — ABNORMAL LOW (ref 150–400)
RBC: 3.26 MIL/uL — ABNORMAL LOW (ref 4.22–5.81)
RDW: 20.8 % — ABNORMAL HIGH (ref 11.5–15.5)
WBC: 3.2 10*3/uL — ABNORMAL LOW (ref 4.0–10.5)

## 2017-10-20 MED ORDER — ANTICOAGULANT SODIUM CITRATE 4% (200MG/5ML) IV SOLN
5.0000 mL | Freq: Once | Status: AC
  Administered 2017-10-20: 5 mL via INTRAVENOUS
  Filled 2017-10-20: qty 5

## 2017-10-20 NOTE — Procedures (Signed)
I have personally attended this patient's dialysis session.   Using [facility] working fine No heparin as HIT studies still pending UF goal 4 liters  Jamal Maes, MD [place] Pager 10/20/2017, 8:56 AM

## 2017-10-20 NOTE — Plan of Care (Signed)
Continue current care plan 

## 2017-10-20 NOTE — Progress Notes (Signed)
Continuing with patient current care plan.

## 2017-10-20 NOTE — Progress Notes (Signed)
[facility]                                                                              Patrick [name] Demographics  Patrick [name], is a 67 y.o. male, DOB - 02-09-1951, UXL:[id number]  Admit date - 10/06/2017   Admitting Physician Chesley Mires, MD  Outpatient Primary MD for the Patrick [name] is Patrick [name], [name]  Outpatient specialists:   LOS - 14  days   Medical records reviewed and are as summarized below:    Chief Complaint  Patrick [name] presents with  . Abnormal Lab       Brief summary   67 yo M SNF resident w/ a Hx CAD, Chronic Systolic CHF (EF 53-66%), remote Colon Cancer w/ perm end colostomy in 2018, CKD Stage III-IV, SBO, and an enlarged prostate who presented to the ED 5/10 after being found w/ altered LOC and several lab abnormalities including cr 13.35 and K 5.13m Hgb 7.5, and hypoglycemia. He was also hypotensive w/ SBP in 70s. He initially underwent CRRT. Now getting intermittent dialysis. 5/10 admit - transfused 1U PRBC - Vascath placed - CRRT initiated 5/13 CRRT stopped  5/11 TTE - diffuse hypokinesis - EF 35-40% - mild mitral regurg 5/16 IHD Patrick [name] was transferred to hospitalist service on 10/11/2017  Assessment & Plan    Acute on chronic CKD stage III/IV -Presented with creatinine of 14.4, baseline creatinine 1.6.  Likely due to ATN in the setting of septic shock -Nephrology was consulted and was placed on CRRT. -Currently on intermittent hemodialysis, last hemodialysis on 5/20. Right IJ HD catheter placed on 5/22 by IR -Underwent hemodialysis today 5/24, TDC working fine  Enterobacter UTI -Finished meropenem on 5/18   Anemia of chronic disease -Likely due to CKD, transfused on 5/16, on Aranesp weekly -Has received 4 units of packed RBCs this admission.  -H&H stable 8.9  Thrombocytopenia, acute on chronic, possibly worsened due to sepsis -DIC panel equivocal, unlikely TTP, negative schistocytes. - no heparin products, SRA test in process to rule out  HIT, if positive, may need to place on argatroban or Arixtra -Platelets improving  Acute metabolic encephalopathy likely due to #1 - improving  Hypovolemic shock -Likely due to #1, shock physiology resolved  Paroxysmal atrial fibrillation, bradycardia with frequent pauses - Not on anticoagulation due to thrombocytopenia, was on apixaban previously  History of colon CA status post colostomy  Code Status: partial  DVT Prophylaxis:   SCD's Family Communication: Discussed in detail with the Patrick [name], all imaging results, lab results explained to the Patrick [name]    Disposition Plan: Awaiting plans from nephrology regarding permanent dialysis  Time Spent in minutes   25 minutes  Procedures:  5/13 CRRT stopped  5/11 TTE - diffuse hypokinesis - EF 35-40% - mild mitral regurg 5/16 IHD  Consultants:   P[facility] Nephrology   Antimicrobials:      Medications  Scheduled Meds: . Chlorhexidine Gluconate Cloth  6 each Topical Daily  . Chlorhexidine Gluconate Cloth  6 each Topical Q0600  . darbepoetin (ARANESP) injection - NON-DIALYSIS  200 mcg Subcutaneous Q Mon-1800  . feeding supplement (NEPRO CARB STEADY)  237 mL Oral TID  BM  . pantoprazole  40 mg Oral Daily  . sodium chloride flush  10-40 mL Intracatheter Q12H  . sodium chloride flush  3 mL Intravenous Q12H   Continuous Infusions: . sodium chloride     PRN Meds:.sodium chloride, baclofen, oxyCODONE-acetaminophen, sodium chloride flush, sodium chloride flush   Antibiotics   Anti-infectives (From admission, onward)   Start     Dose/Rate Route Frequency Ordered Stop   10/18/17 1321  ceFAZolin (ANCEF) 2-4 GM/100ML-% IVPB    Note to Pharmacy:  Cecile Sheerer   : cabinet override      10/18/17 1321 10/19/17 0129   10/18/17 0800  ceFAZolin (ANCEF) IVPB 2g/100 mL premix     2 g 200 mL/hr over 30 Minutes Intravenous On call 10/17/17 1154 10/18/17 1400   10/10/17 2200  meropenem (MERREM) 500 mg in sodium chloride 0.9 % 100 mL IVPB      500 mg 200 mL/hr over 30 Minutes Intravenous Every 24 hours 10/10/17 1022 10/14/17 2235   10/08/17 1200  meropenem (MERREM) 1 g in sodium chloride 0.9 % 100 mL IVPB  Status:  Discontinued     1 g 200 mL/hr over 30 Minutes Intravenous Every 12 hours 10/08/17 1050 10/10/17 1022   10/07/17 1600  vancomycin (VANCOCIN) IVPB 1000 mg/200 mL premix  Status:  Discontinued     1,000 mg 200 mL/hr over 60 Minutes Intravenous Every 24 hours 10/06/17 2007 10/08/17 1050   10/07/17 0400  piperacillin-tazobactam (ZOSYN) IVPB 3.375 g  Status:  Discontinued     3.375 g 100 mL/hr over 30 Minutes Intravenous Every 6 hours 10/06/17 2313 10/08/17 1050   10/06/17 2000  piperacillin-tazobactam (ZOSYN) IVPB 3.375 g  Status:  Discontinued     3.375 g 12.5 mL/hr over 240 Minutes Intravenous Every 6 hours 10/06/17 1955 10/06/17 2313   10/06/17 1615  vancomycin (VANCOCIN) 2,000 mg in sodium chloride 0.9 % 500 mL IVPB     2,000 mg 250 mL/hr over 120 Minutes Intravenous  Once 10/06/17 1602 10/06/17 1815   10/06/17 1600  piperacillin-tazobactam (ZOSYN) IVPB 3.375 g     3.375 g 100 mL/hr over 30 Minutes Intravenous  Once 10/06/17 1602 10/06/17 1706   10/06/17 1130  cefTRIAXone (ROCEPHIN) 1 g in sodium chloride 0.9 % 100 mL IVPB  Status:  Discontinued     1 g 200 mL/hr over 30 Minutes Intravenous Every 24 hours 10/06/17 1101 10/06/17 1516   10/06/17 0400  piperacillin-tazobactam (ZOSYN) IVPB 3.375 g  Status:  Discontinued     3.375 g 12.5 mL/hr over 240 Minutes Intravenous Every 12 hours 10/06/17 1602 10/06/17 1955        Subjective:   [name] was seen and examined today.  No complaints, tolerated HD well, seen after the hemodialysis today.  No acute issues overnight.  Patrick [name] denies dizziness, chest pain, abdominal pain, N/V.   Objective:   Vitals:   10/20/17 1030 10/20/17 1100 10/20/17 1122 10/20/17 1210  BP: (!) 110/54 (!) 94/42 (!) 109/53 114/68  Pulse: 94 78 88 80  Resp:   18 (!) 24  Temp:   98 F  (36.7 C) 97.7 F (36.5 C)  TempSrc:   Oral Oral  SpO2:   100% 100%  Weight:   80.1 kg (176 lb 9.4 oz)   Height:        Intake/Output Summary (Last 24 hours) at 10/20/2017 1258 Last data filed at 10/20/2017 1210 Gross per 24 hour  Intake 960 ml  Output 4410 ml  Net -3450 ml     Wt Readings from Last 3 Encounters:  10/20/17 80.1 kg (176 lb 9.4 oz)  08/29/17 97.5 kg (215 lb)  05/26/17 93.7 kg (206 lb 9.6 oz)     Exam   General: Alert and oriented x 3, NAD Eyes:  HEENT:   Cardiovascular: S1 S2 auscultated, RRR no pedal edema b/l Respiratory: Clear to auscultation bilaterally, no wheezing, rales or rhonchi Gastrointestinal: Soft, nontender, nondistended, + bowel sounds, colostomy Ext: no pedal edema bilaterally Neuro: new deficit Musculoskeletal: No digital cyanosis, clubbing Skin: No rashes Psych: Normal affect and demeanor, alert and oriented x3      Data Reviewed:  I have personally reviewed following labs and imaging studies  Micro Results No results found for this or any previous visit (from the past 240 hour(s)).  Radiology Reports Dg Abd 1 View  Result Date: 10/06/2017 CLINICAL DATA:  Orogastric tube placement. EXAM: ABDOMEN - 1 VIEW COMPARISON:  06/01/2016. FINDINGS: Orogastric tube tip in the proximal stomach and side hole in the proximal to mid stomach. Gaseous distention of the stomach. Again demonstrated are 3 large gallstones in the gallbladder measuring up to 2.6 cm in maximum diameter each. No bowel gas visualized except for the gas in the stomach. Patchy opacity in the left lower lobe. Lumbar spine degenerative changes and minimal scoliosis. IMPRESSION: 1. Gaseous distention of the stomach with no other bowel gas seen. This is concerning for gastric outlet obstruction. 2. Cholelithiasis. 3. Patchy left lower lobe atelectasis or pneumonia. Electronically Signed   By: Claudie Revering M.D.   On: 10/06/2017 19:55   US Renal  Result Date: 10/06/2017 CLINICAL  DATA:  67 year old male with acute renal failure. EXAM: RENAL / URINARY TRACT ULTRASOUND COMPLETE COMPARISON:  10/06/2016 ultrasound and 03/06/2017 MR FINDINGS: Right Kidney: Length: 12.1 cm. Diffuse increased echogenicity noted. No solid mass or hydronephrosis noted. Left Kidney: Length: 12.4 cm. Diffuse increased echogenicity noted. No solid mass or hydronephrosis identified. A 1.5 cm cyst is again identified. Bladder: A Foley catheter is present within a collapsed bladder. Incidental note is made of ascites. IMPRESSION: 1. Echogenic kidneys bilaterally compatible with medical renal disease. No evidence of hydronephrosis. 2. Ascites Electronically Signed   By: Margarette Canada M.D.   On: 10/06/2017 16:41   Ir Fluoro Guide Cv Line Right  Result Date: 10/18/2017 INDICATION: End-stage renal disease EXAM: TUNNELED DIALYSIS CATHETER PLACEMENT, ULTRASOUND GUIDANCE FOR VASCULAR ACCESS MEDICATIONS: Ancef 2 g; The antibiotic was administered within an appropriate time interval prior to skin puncture. ANESTHESIA/SEDATION: Versed 0.5 mg IV; Fentanyl 25 mcg IV; Moderate Sedation Time:  12 minutes The Patrick [name] was continuously monitored during the procedure by the interventional radiology nurse under my direct supervision. FLUOROSCOPY TIME:  Fluoroscopy Time:  minutes  seconds ( mGy). COMPLICATIONS: None immediate. PROCEDURE: Informed written consent was obtained from the Patrick [name] after a thorough discussion of the procedural risks, benefits and alternatives. All questions were addressed. Maximal Sterile Barrier Technique was utilized including caps, mask, sterile gowns, sterile gloves, sterile drape, hand hygiene and skin antiseptic. A timeout was performed prior to the initiation of the procedure. The right neck was prepped with ChloraPrep in a sterile fashion, and a sterile drape was applied covering the operative field. A sterile gown and sterile gloves were used for the procedure. 1% lidocaine into the skin and subcutaneous  tissue. The right jugular vein was noted to be patent initially with ultrasound. Under sonographic guidance, a micropuncture needle was inserted into the right IJ vein (  Ultrasound and fluoroscopic image documentation was performed). It was removed over an 018 wire which was up-sized to an Amplatz. This was advanced into the IVC. A small incision was made in the right upper chest. The tunneling device was utilized to advance the 23 centimeter tip to cuff catheter from the chest incision and out the neck incision. A peel-away sheath was advanced over the Amplatz wire. The leading edge of the catheter was then advanced through the peel-away sheath. The peel-away sheath was removed. It was flushed and instilled with heparin. The chest incision was closed with a 0 Prolene pursestring stitch. The neck incision was closed with a 4-0 Vicryl subcuticular stitch. IMPRESSION: Successful right IJ vein tunneled dialysis catheter with its tip in the right atrium. Electronically Signed   By: Marybelle Killings M.D.   On: 10/18/2017 14:53   Ir US Guide Vasc Access Right  Result Date: 10/18/2017 INDICATION: End-stage renal disease EXAM: TUNNELED DIALYSIS CATHETER PLACEMENT, ULTRASOUND GUIDANCE FOR VASCULAR ACCESS MEDICATIONS: Ancef 2 g; The antibiotic was administered within an appropriate time interval prior to skin puncture. ANESTHESIA/SEDATION: Versed 0.5 mg IV; Fentanyl 25 mcg IV; Moderate Sedation Time:  12 minutes The Patrick [name] was continuously monitored during the procedure by the interventional radiology nurse under my direct supervision. FLUOROSCOPY TIME:  Fluoroscopy Time:  minutes  seconds ( mGy). COMPLICATIONS: None immediate. PROCEDURE: Informed written consent was obtained from the Patrick [name] after a thorough discussion of the procedural risks, benefits and alternatives. All questions were addressed. Maximal Sterile Barrier Technique was utilized including caps, mask, sterile gowns, sterile gloves, sterile drape, hand hygiene  and skin antiseptic. A timeout was performed prior to the initiation of the procedure. The right neck was prepped with ChloraPrep in a sterile fashion, and a sterile drape was applied covering the operative field. A sterile gown and sterile gloves were used for the procedure. 1% lidocaine into the skin and subcutaneous tissue. The right jugular vein was noted to be patent initially with ultrasound. Under sonographic guidance, a micropuncture needle was inserted into the right IJ vein (Ultrasound and fluoroscopic image documentation was performed). It was removed over an 018 wire which was up-sized to an Amplatz. This was advanced into the IVC. A small incision was made in the right upper chest. The tunneling device was utilized to advance the 23 centimeter tip to cuff catheter from the chest incision and out the neck incision. A peel-away sheath was advanced over the Amplatz wire. The leading edge of the catheter was then advanced through the peel-away sheath. The peel-away sheath was removed. It was flushed and instilled with heparin. The chest incision was closed with a 0 Prolene pursestring stitch. The neck incision was closed with a 4-0 Vicryl subcuticular stitch. IMPRESSION: Successful right IJ vein tunneled dialysis catheter with its tip in the right atrium. Electronically Signed   By: Marybelle Killings M.D.   On: 10/18/2017 14:53   Dg Chest Port 1 View  Result Date: 10/09/2017 CLINICAL DATA:  Shortness of breath EXAM: PORTABLE CHEST 1 VIEW COMPARISON:  10/08/2017 FINDINGS: Cardiac shadow remains mildly enlarged. Endotracheal tube, nasogastric catheter and left jugular temporary dialysis catheter are again seen and stable. The lungs are hypoinflated. Some persistent left retrocardiac density is noted. No new focal abnormality is noted. IMPRESSION: No significant change from the previous day. Electronically Signed   By: Inez Catalina M.D.   On: 10/09/2017 07:10   Dg Chest Port 1 View  Result Date:  10/08/2017 CLINICAL DATA:  Endotracheal tube  placement. EXAM: PORTABLE CHEST 1 VIEW COMPARISON:  Oct 07, 2017 FINDINGS: Mediastinal contour is stable. The heart size is enlarged. Endotracheal tube is identified distal tip 4.9 cm from carina. A left central venous line is identified distal tip in the superior vena cava. A nasogastric tube is noted with distal tip not included on film but is probably in the stomach. Mild patchy opacities identified in the left lung base. There is no pulmonary edema or pleural effusion. The visualized skeletal structures are stable. IMPRESSION: Life supporting devices as described. Endotracheal tube in good position. Cardiomegaly. Patchy opacity of the medial left lung base probably due to atelectasis but superimposed pneumonia is not excluded. Electronically Signed   By: Abelardo Diesel M.D.   On: 10/08/2017 08:03   Dg Chest Port 1 View  Result Date: 10/07/2017 CLINICAL DATA:  Respiratory failure EXAM: PORTABLE CHEST 1 VIEW COMPARISON:  Oct 06, 2017 FINDINGS: The ETT, left central line, and NG tube are again identified and stable within visualize limits. No pneumothorax. Stable cardiomegaly. The hila and mediastinum are unchanged. Mild opacity in the medial right lung base may represent atelectasis, vascular crowding, or subtle infiltrate. Recommend attention on follow-up. No other acute abnormalities. IMPRESSION: 1. Stable support apparatus as above. 2. Mild increased opacity in the medial right lung base could represent vascular crowding, atelectasis, or subtle infiltrate. Recommend attention on follow-up. Electronically Signed   By: Dorise Bullion III M.D   On: 10/07/2017 07:47   Dg Chest Port 1 View  Result Date: 10/06/2017 CLINICAL DATA:  Intubated. EXAM: PORTABLE CHEST 1 VIEW COMPARISON:  Earlier today. FINDINGS: Endotracheal tube tip 3.6 cm above the carina. Left jugular catheter unchanged. Stable enlarged cardiac silhouette and prominent interstitial markings. Mild  increase in patchy opacity in the left lower lobe. Minimal right basilar atelectasis with improvement. No acute bony abnormality. IMPRESSION: 1. Endotracheal tube tip 3.5 cm above the carina. This could be retracted 3.5 cm to place it at the level of the clavicles. 2. Mildly increased patchy atelectasis or pneumonia in the left lower lobe. 3. Minimal right basilar atelectasis with improvement. 4. Stable cardiomegaly and chronic interstitial lung disease. Electronically Signed   By: Claudie Revering M.D.   On: 10/06/2017 19:53   Dg Chest Port 1 View  Result Date: 10/06/2017 CLINICAL DATA:  Encounter for central line placement. EXAM: PORTABLE CHEST 1 VIEW COMPARISON:  Radiograph of same day. FINDINGS: Stable cardiomegaly. Hypoinflation of the lungs is noted with mild bibasilar subsegmental atelectasis. Interval placement of left internal jugular dialysis catheter with distal tip in expected position of the SVC. No pneumothorax or significant pleural effusion is noted. Bony thorax is unremarkable. IMPRESSION: Interval placement of left internal jugular dialysis catheter with distal tip in expected position of SVC. No pneumothorax is noted. Hypoinflation of the lungs with mild bibasilar subsegmental atelectasis. Electronically Signed   By: Marijo Conception, M.D.   On: 10/06/2017 15:23   Dg Chest Portable 1 View  Result Date: 10/06/2017 CLINICAL DATA:  67 year old male with a history of hypotension EXAM: PORTABLE CHEST 1 VIEW COMPARISON:  05/23/2017, 10/08/2016 FINDINGS: Cardiomediastinal silhouette unchanged, likely accentuated by apical lordotic positioning. Persisting cardiomegaly. No pneumothorax.  No large pleural effusion.  Low lung volumes. IMPRESSION: Redemonstration of cardiomegaly. The accentuated appearance is favored to be secondary to the apical lordotic positioning, however, new pericardial effusion not excluded. If there is concern for pericardial effusion, recommend correlation with ECHO. No evidence  of lobar pneumonia or overt edema. Electronically Signed  By: Corrie Mckusick D.O.   On: 10/06/2017 12:15   Dg Abd Portable 1v  Result Date: 10/08/2017 CLINICAL DATA:  Gastric outlet obstruction EXAM: PORTABLE ABDOMEN - 1 VIEW COMPARISON:  Portable exam 1118 hours compared to 10/06/2017 FINDINGS: Nasogastric tube coiled in stomach, which is decompressed. Air-filled loops of nonspecific small bowel mid abdomen. No bowel wall thickening. Paucity of colonic gas. Multiple calcified gallstones RIGHT upper quadrant. Bones demineralized. IMPRESSION: Cholelithiasis. Decompressed stomach versus prior exam. Nonspecific mildly dilated air-filled loops of small bowel in the mid abdomen with paucity of colonic gas, nonspecific, could reflect ileus or even potentially small-bowel obstruction; further imaging as clinically warranted. Electronically Signed   By: Lavonia Dana M.D.   On: 10/08/2017 11:29    Lab Data:  CBC: Recent Labs  Lab 10/15/17 0434 10/16/17 0416 10/17/17 0310 10/18/17 0513 10/20/17 0253  WBC 4.1 4.2 4.3 4.8 3.2*  NEUTROABS  --   --   --   --  2.0  HGB 9.2* 9.3* 9.2* 9.2* 8.9*  HCT 29.5* 29.1* 29.0* 29.6* 28.7*  MCV 87.3 87.7 87.3 88.1 88.0  PLT 78* 94* 58* 66* 86*   Basic Metabolic Panel: Recent Labs  Lab 10/14/17 0256  10/15/17 0434 10/16/17 0416 10/17/17 0310 10/18/17 0513 10/19/17 0300 10/20/17 0253  NA  --    < >  --  136 136 136 136 136  K  --    < >  --  4.3 3.9 4.1 3.5 3.7  CL  --    < >  --  97* 98* 98* 97* 96*  CO2  --    < >  --  27 28 27 30 29   GLUCOSE  --    < >  --  90 82 82 88 81  BUN  --    < >  --  31* 18 31* 18 31*  CREATININE  --    < >  --  4.74* 3.42* 4.52* 3.17* 4.30*  CALCIUM  --    < >  --  8.8* 8.6* 8.7* 8.4* 8.5*  MG 1.9  --  1.9 1.9 1.7  --   --   --   PHOS  --    < >  --  3.6 2.8 3.6 3.3 3.9   < > = values in this interval not displayed.   GFR: Estimated Creatinine Clearance: 16.1 mL/min (A) (by C-G formula based on SCr of 4.3 mg/dL  (H)). Liver Function Tests: Recent Labs  Lab 10/16/17 0416 10/17/17 0310 10/18/17 0513 10/19/17 0300 10/20/17 0253  ALBUMIN 2.3* 2.3* 2.3* 2.2* 2.2*   No results for input(s): LIPASE, AMYLASE in the last 168 hours. No results for input(s): AMMONIA in the last 168 hours. Coagulation Profile: Recent Labs  Lab 10/17/17 1345  INR 1.47   Cardiac Enzymes: No results for input(s): CKTOTAL, CKMB, CKMBINDEX, TROPONINI in the last 168 hours. BNP (last 3 results) No results for input(s): PROBNP in the last 8760 hours. HbA1C: No results for input(s): HGBA1C in the last 72 hours. CBG: Recent Labs  Lab 10/15/17 1954 10/15/17 2340 10/16/17 0318 10/16/17 2013 10/16/17 2306  GLUCAP 89 105* 73 87 81   Lipid Profile: No results for input(s): CHOL, HDL, LDLCALC, TRIG, CHOLHDL, LDLDIRECT in the last 72 hours. Thyroid Function Tests: No results for input(s): TSH, T4TOTAL, FREET4, T3FREE, THYROIDAB in the last 72 hours. Anemia Panel: No results for input(s): VITAMINB12, FOLATE, FERRITIN, TIBC, IRON, RETICCTPCT in the last 72 hours. Urine analysis:  Component Value Date/Time   COLORURINE RED (A) 10/06/2017 1101   APPEARANCEUR TURBID (A) 10/06/2017 1101   LABSPEC  10/06/2017 1101    TEST NOT REPORTED DUE TO COLOR INTERFERENCE OF URINE PIGMENT   PHURINE  10/06/2017 1101    TEST NOT REPORTED DUE TO COLOR INTERFERENCE OF URINE PIGMENT   GLUCOSEU (A) 10/06/2017 1101    TEST NOT REPORTED DUE TO COLOR INTERFERENCE OF URINE PIGMENT   HGBUR (A) 10/06/2017 1101    TEST NOT REPORTED DUE TO COLOR INTERFERENCE OF URINE PIGMENT   HGBUR negative 12/26/2008 1541   BILIRUBINUR (A) 10/06/2017 1101    TEST NOT REPORTED DUE TO COLOR INTERFERENCE OF URINE PIGMENT   KETONESUR (A) 10/06/2017 1101    TEST NOT REPORTED DUE TO COLOR INTERFERENCE OF URINE PIGMENT   PROTEINUR (A) 10/06/2017 1101    TEST NOT REPORTED DUE TO COLOR INTERFERENCE OF URINE PIGMENT   UROBILINOGEN 1.0 04/03/2015 1725   NITRITE  (A) 10/06/2017 1101    TEST NOT REPORTED DUE TO COLOR INTERFERENCE OF URINE PIGMENT   LEUKOCYTESUR (A) 10/06/2017 1101    TEST NOT REPORTED DUE TO COLOR INTERFERENCE OF URINE PIGMENT     Delvecchio Madole M.D. [facility] 10/20/2017, 12:58 PM  Pager: [phone] Between 7am to 7pm - call Pager - [phone]  After 7pm go to [email or web address] - password [email or web address]  Call night coverage person covering after 7pm

## 2017-10-20 NOTE — Progress Notes (Signed)
PT Cancellation Note  Patient Details Name: Patrick [name] MRN: [id number] DOB: 07-25-1950   Cancelled Treatment:    Reason Eval/Treat Not Completed: Patient at procedure or test/unavailable(pt in HD)   Patrick [name] 10/20/2017, 7:28 AM  Patrick [name], [place]

## 2017-10-20 NOTE — Progress Notes (Signed)
[facility] Rounding Note  Background 67 y.o. yo male NHP, wheelchair bound, PMH CAD, HTN, HLD, BPH, CM with EF 35%, RV and LV dysfunction,  baseline CKD3 (creatinine 1.48 - 1.78 and on losartan outpt). Admitted 10/06/2017 with creatinine 14, AMS -> urosepsis and AKI (ATN felt 2/2 septic shock). 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 Renal US 5/10 12.1, 12.4 echodense. Of note had chronic indwelling foley since May 2018 due to inability to empty bladder, removed this admission, doing daily bladder scans to monitor for UOP. 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 AKI on CKD3 - 2/2 ATN/septic shock from enterobacter urosepsis. CRRT 5/10-5/13, IHD 5/16, 5/18, 5/20, 5/22, 5/24 No evidence return of renal function yet. Remains anuric by bladder scans  1. We have not made plans for HD on the outside for him as still hoping for some recovery, but with persistent anuria looking less optimistic  2. Indwelling foley since May 2018 (removed this admission)  for inability to pass urine on his own (sounds like BPH). Doing daily bladder scans now as this inability to void issue could mask renal recovery. (but no urine yet) 3. HD today  2. Urosepsis- enterobacter- meropenem finished 5/18.  3. Anemia- on ESA- no iron needed.  Transfused 5/16. Gets Aranesp 200 QMonday  4. Thrombocytopenia - acute on chronic. Had been attributed to sepsis but persistently low. Had been getting tight heparin with HD. 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 In the meantime no heparin, citrate block for TDC. 5. PAF/bradycardia - no anticoag at present 2/2 low plts  Jamal Maes, MD [facility] [phone] Pager 10/20/2017, 8:45 AM   Subjective: TDC replacing temp cath (IR 5/22)  Still no urine on bladder scans so no need for foley replacement yet Depressed about lack of kidney recovery  Objective Vital signs in last 24  hours: Vitals:   10/20/17 0725 10/20/17 0745 10/20/17 0800 10/20/17 0830  BP: (!) 119/57 (!) 122/57 (!) 118/51 (!) 91/49  Pulse: 76 89 82 94  Resp:      Temp:      TempSrc:      SpO2:      Weight:      Height:       Weight change: -0.4 kg (-14.1 oz)  Intake/Output Summary (Last 24 hours) at 10/20/2017 0845 Last data filed at 10/20/2017 0500 Gross per 24 hour  Intake 960 ml  Output 410 ml  Net 550 ml   Physical Exam: Very nice man seen in the HD unit Lungs with decr BS bases and some rales S1S2 No S3 1-2/6 murmur LSB Abd soft Colostomy bag, brown stool, hernia 1-2+ dependent pitting edema LE's up to the hips some improved, still pitting of lateral abd wall as well TDC R IJ (5/22 IR) no issues L temp cath out   Recent Labs  Lab 10/18/17 0513 10/19/17 0300 10/20/17 0253  NA 136 136 136  K 4.1 3.5 3.7  CL 98* 97* 96*  CO2 27 30 29   GLUCOSE 82 88 81  BUN 31* 18 31*  CREATININE 4.52* 3.17* 4.30*  CALCIUM 8.7* 8.4* 8.5*  PHOS 3.6 3.3 3.9    Recent Labs  Lab 10/18/17 0513 10/19/17 0300 10/20/17 0253  ALBUMIN 2.3* 2.2* 2.2*    Recent Labs  Lab 10/15/17 0434 10/16/17 0416 10/17/17 0310 10/18/17 0513 10/20/17 0253  WBC 4.1 4.2 4.3 4.8 3.2*  NEUTROABS  --   --   --   --  2.0  HGB 9.2* 9.3* 9.2* 9.2* 8.9*  HCT 29.5* 29.1* 29.0* 29.6* 28.7*  MCV 87.3 87.7 87.3 88.1 88.0  PLT 78* 94* 58* 66* 86*    Recent Labs  Lab 10/15/17 1954 10/15/17 2340 10/16/17 0318 10/16/17 2013 10/16/17 2306  GLUCAP 89 105* 73 87 81   Lab Results  Component Value Date   IRON 45 10/11/2017   TIBC 148 (L) 10/11/2017   FERRITIN 686 (H) 10/11/2017        Transferrin saturation                                   30%  Scheduled Medications: . Chlorhexidine Gluconate Cloth  6 each Topical Daily  . Chlorhexidine Gluconate Cloth  6 each Topical Q0600  . darbepoetin (ARANESP) injection - NON-DIALYSIS  200 mcg Subcutaneous Q Mon-1800  . feeding supplement (NEPRO CARB STEADY)  237  mL Oral TID BM  . pantoprazole  40 mg Oral Daily  . sodium chloride flush  10-40 mL Intracatheter Q12H  . sodium chloride flush  3 mL Intravenous Q12H   . sodium chloride    . sodium chloride    . sodium chloride

## 2017-10-21 ENCOUNTER — Inpatient Hospital Stay (HOSPITAL_COMMUNITY): Payer: Medicare Other

## 2017-10-21 LAB — CBC WITH DIFFERENTIAL/PLATELET
ABS IMMATURE GRANULOCYTES: 0 10*3/uL (ref 0.0–0.1)
Basophils Absolute: 0 10*3/uL (ref 0.0–0.1)
Basophils Relative: 1 %
EOS ABS: 0.1 10*3/uL (ref 0.0–0.7)
Eosinophils Relative: 2 %
HEMATOCRIT: 29.3 % — AB (ref 39.0–52.0)
HEMOGLOBIN: 9.1 g/dL — AB (ref 13.0–17.0)
IMMATURE GRANULOCYTES: 1 %
LYMPHS ABS: 0.5 10*3/uL — AB (ref 0.7–4.0)
LYMPHS PCT: 14 %
MCH: 27.2 pg (ref 26.0–34.0)
MCHC: 31.1 g/dL (ref 30.0–36.0)
MCV: 87.7 fL (ref 78.0–100.0)
MONOS PCT: 28 %
Monocytes Absolute: 0.9 10*3/uL (ref 0.1–1.0)
NEUTROS ABS: 1.9 10*3/uL (ref 1.7–7.7)
NEUTROS PCT: 54 %
Platelets: 106 10*3/uL — ABNORMAL LOW (ref 150–400)
RBC: 3.34 MIL/uL — ABNORMAL LOW (ref 4.22–5.81)
RDW: 20.8 % — AB (ref 11.5–15.5)
WBC: 3.4 10*3/uL — ABNORMAL LOW (ref 4.0–10.5)

## 2017-10-21 LAB — URIC ACID: URIC ACID, SERUM: 3.2 mg/dL — AB (ref 4.4–7.6)

## 2017-10-21 LAB — RENAL FUNCTION PANEL
ANION GAP: 9 (ref 5–15)
Albumin: 2.3 g/dL — ABNORMAL LOW (ref 3.5–5.0)
BUN: 21 mg/dL — ABNORMAL HIGH (ref 6–20)
CO2: 29 mmol/L (ref 22–32)
Calcium: 8.5 mg/dL — ABNORMAL LOW (ref 8.9–10.3)
Chloride: 96 mmol/L — ABNORMAL LOW (ref 101–111)
Creatinine, Ser: 3.23 mg/dL — ABNORMAL HIGH (ref 0.61–1.24)
GFR calc non Af Amer: 18 mL/min — ABNORMAL LOW (ref >60)
GFR, EST AFRICAN AMERICAN: 21 mL/min — AB (ref >60)
Glucose, Bld: 88 mg/dL (ref 65–99)
Phosphorus: 3.2 mg/dL (ref 2.5–4.6)
Potassium: 3.7 mmol/L (ref 3.5–5.1)
SODIUM: 134 mmol/L — AB (ref 135–145)

## 2017-10-21 LAB — SEDIMENTATION RATE: Sed Rate: 40 mm/hr — ABNORMAL HIGH (ref 0–16)

## 2017-10-21 LAB — C-REACTIVE PROTEIN: CRP: 5.4 mg/dL — AB (ref <1.0)

## 2017-10-21 MED ORDER — DARBEPOETIN ALFA 200 MCG/0.4ML IJ SOSY
200.0000 ug | PREFILLED_SYRINGE | INTRAMUSCULAR | Status: DC
  Administered 2017-10-23: 200 ug via INTRAVENOUS

## 2017-10-21 MED ORDER — PREDNISONE 20 MG PO TABS
40.0000 mg | ORAL_TABLET | Freq: Every day | ORAL | Status: AC
  Administered 2017-10-22 – 2017-10-24 (×3): 40 mg via ORAL
  Filled 2017-10-21 (×3): qty 2

## 2017-10-21 NOTE — Progress Notes (Signed)
[facility]                                                                              Patient Demographics  Patrick [name], is a 67 y.o. male, DOB - 11-Mar-1951, UMP:[id number]  Admit date - 10/06/2017   Admitting Physician Chesley Mires, MD  Outpatient Primary MD for the patient is Patient, [name]  Outpatient specialists:   LOS - 15  days   Medical records reviewed and are as summarized below:    Chief Complaint  Patient presents with  . Abnormal Lab       Brief summary   67 yo M SNF resident w/ a Hx CAD, Chronic Systolic CHF (EF 40-08%), remote Colon Cancer w/ perm end colostomy in 2018, CKD Stage III-IV, SBO, and an enlarged prostate who presented to the ED 5/10 after being found w/ altered LOC and several lab abnormalities including cr 13.35 and K 5.48mHgb 7.5, and hypoglycemia. He was also hypotensive w/ SBP in 70s. He initially underwent CRRT. Now getting intermittent dialysis. 5/10 admit - transfused 1U PRBC - Vascath placed - CRRT initiated 5/13 CRRT stopped  5/11 TTE - diffuse hypokinesis - EF 35-40% - mild mitral regurg 5/16 IHD Patient was transferred to hospitalist service on 10/11/2017  Assessment & Plan    Acute on chronic CKD stage III/IV -Presented with creatinine of 14.4, baseline creatinine 1.6.  Likely due to ATN in the setting of septic shock -Nephrology was consulted and was placed on CRRT. -Currently on intermittent hemodialysis, Right IJ HD catheter placed on 5/22 by IR, underwent hemodialysis on 5/24 -HD planned again on Monday, will follow nephrology recommendations regarding CLIP  Left wrist pain likely due to severe osteoarthritis -Tender since last night, ESR, CRP mildly elevated, uric acid low, -Left wrist x-ray consistent with osteoarthritis flare  -Placed on prednisone 40 mg for 3 days  Enterobacter UTI -Finished meropenem on 5/18   Anemia of chronic disease -Likely due to CKD, transfused on 5/16, on Aranesp  weekly -Has received 4 units of packed RBCs this admission.  -H&H stable  Thrombocytopenia, acute on chronic, possibly worsened due to sepsis -DIC panel equivocal, unlikely TTP, negative schistocytes. - no heparin products, SRA test pending, if positive, may need to place on argatroban or Arixtra -Platelets continues to improve   Acute metabolic encephalopathy likely due to #1 -Resolved, alert and oriented x3  Hypovolemic shock -Likely due to #1, shock physiology resolved  Paroxysmal atrial fibrillation, bradycardia with frequent pauses - Not on anticoagulation due to thrombocytopenia, was on apixaban previously  History of colon CA status post colostomy  Code Status: partial  DVT Prophylaxis:   SCD's Family Communication: Discussed in detail with the patient, all imaging results, lab results explained to the patient    Disposition Plan: Awaiting plans from nephrology regarding permanent dialysis  Time Spent in minutes   25 minutes  Procedures:  5/13 CRRT stopped  5/11 TTE - diffuse hypokinesis - EF 35-40% - mild mitral regurg 5/16 IHD  Consultants:   PCCM Nephrology   Antimicrobials:      Medications  Scheduled Meds: . Chlorhexidine Gluconate Cloth  6 each Topical Daily  . Chlorhexidine Gluconate Cloth  6 each Topical Q0600  . [START ON 10/23/2017] darbepoetin (ARANESP) injection - DIALYSIS  200 mcg Intravenous Q Mon-HD  . feeding supplement (NEPRO CARB STEADY)  237 mL Oral TID BM  . pantoprazole  40 mg Oral Daily  . predniSONE  40 mg Oral Q breakfast  . sodium chloride flush  10-40 mL Intracatheter Q12H  . sodium chloride flush  3 mL Intravenous Q12H   Continuous Infusions: . sodium chloride     PRN Meds:.sodium chloride, baclofen, oxyCODONE-acetaminophen, sodium chloride flush, sodium chloride flush   Antibiotics   Anti-infectives (From admission, onward)   Start     Dose/Rate Route Frequency Ordered Stop   10/18/17 1321  ceFAZolin (ANCEF) 2-4  GM/100ML-% IVPB    Note to Pharmacy:  Cecile Sheerer   : cabinet override      10/18/17 1321 10/19/17 0129   10/18/17 0800  ceFAZolin (ANCEF) IVPB 2g/100 mL premix     2 g 200 mL/hr over 30 Minutes Intravenous On call 10/17/17 1154 10/18/17 1400   10/10/17 2200  meropenem (MERREM) 500 mg in sodium chloride 0.9 % 100 mL IVPB     500 mg 200 mL/hr over 30 Minutes Intravenous Every 24 hours 10/10/17 1022 10/14/17 2235   10/08/17 1200  meropenem (MERREM) 1 g in sodium chloride 0.9 % 100 mL IVPB  Status:  Discontinued     1 g 200 mL/hr over 30 Minutes Intravenous Every 12 hours 10/08/17 1050 10/10/17 1022   10/07/17 1600  vancomycin (VANCOCIN) IVPB 1000 mg/200 mL premix  Status:  Discontinued     1,000 mg 200 mL/hr over 60 Minutes Intravenous Every 24 hours 10/06/17 2007 10/08/17 1050   10/07/17 0400  piperacillin-tazobactam (ZOSYN) IVPB 3.375 g  Status:  Discontinued     3.375 g 100 mL/hr over 30 Minutes Intravenous Every 6 hours 10/06/17 2313 10/08/17 1050   10/06/17 2000  piperacillin-tazobactam (ZOSYN) IVPB 3.375 g  Status:  Discontinued     3.375 g 12.5 mL/hr over 240 Minutes Intravenous Every 6 hours 10/06/17 1955 10/06/17 2313   10/06/17 1615  vancomycin (VANCOCIN) 2,000 mg in sodium chloride 0.9 % 500 mL IVPB     2,000 mg 250 mL/hr over 120 Minutes Intravenous  Once 10/06/17 1602 10/06/17 1815   10/06/17 1600  piperacillin-tazobactam (ZOSYN) IVPB 3.375 g     3.375 g 100 mL/hr over 30 Minutes Intravenous  Once 10/06/17 1602 10/06/17 1706   10/06/17 1130  cefTRIAXone (ROCEPHIN) 1 g in sodium chloride 0.9 % 100 mL IVPB  Status:  Discontinued     1 g 200 mL/hr over 30 Minutes Intravenous Every 24 hours 10/06/17 1101 10/06/17 1516   10/06/17 0400  piperacillin-tazobactam (ZOSYN) IVPB 3.375 g  Status:  Discontinued     3.375 g 12.5 mL/hr over 240 Minutes Intravenous Every 12 hours 10/06/17 1602 10/06/17 1955        Subjective:   [name] was seen and examined today.  States left  wrist has been tender and bothering him since last night.  No chest pain or shortness of breath.  Patient denies dizziness,  abdominal pain, N/V.   Objective:   Vitals:   10/21/17 0554 10/21/17 0733 10/21/17 0800 10/21/17 1125  BP:  115/73  119/78  Pulse:  81 72 77  Resp:  '18 18 18  ' Temp:  98.3 F (36.8 C)  (!) 97.5 F (36.4 C)  TempSrc:  Oral  Oral  SpO2:  100% 100% 100%  Weight: 80.5 kg (177 lb 8 oz)     Height:        Intake/Output Summary (Last 24 hours) at 10/21/2017 1226 Last data filed at 10/21/2017 1000 Gross per 24 hour  Intake 1077 ml  Output 200 ml  Net 877 ml     Wt Readings from Last 3 Encounters:  10/21/17 80.5 kg (177 lb 8 oz)  08/29/17 97.5 kg (215 lb)  05/26/17 93.7 kg (206 lb 9.6 oz)     Exam   General: Alert and oriented x 3, NAD  Eyes:  HEENT:   Cardiovascular: S1 S2 auscultated, Regular rate and rhythm. 2+ pedal edema b/l  Respiratory: Clear to auscultation bilaterally, no wheezing, rales or rhonchi  Gastrointestinal: Soft, nontender, nondistended, + bowel sounds  Ext: 2-3+ pitting edema bilaterally, left wrist tender, ROM decreased due to pain  Neuro: no new deficits  Musculoskeletal: No digital cyanosis, clubbing  Skin: No rashes  Psych: Normal affect and demeanor, alert and oriented x3    Data Reviewed:  I have personally reviewed following labs and imaging studies  Micro Results No results found for this or any previous visit (from the past 240 hour(s)).  Radiology Reports Dg Wrist Complete Left  Result Date: 10/21/2017 CLINICAL DATA:  Acute left wrist pain without known injury. EXAM: LEFT WRIST - COMPLETE 3+ VIEW COMPARISON:  None. FINDINGS: There is no evidence of fracture or dislocation. Moderate narrowing and sclerosis is seen involving the first carpometacarpal joint. Soft tissues are unremarkable. IMPRESSION: Osteoarthritis of the first carpometacarpal joint. No acute abnormality seen in the left wrist. Electronically  Signed   By: Marijo Conception, M.D.   On: 10/21/2017 09:48   Dg Abd 1 View  Result Date: 10/06/2017 CLINICAL DATA:  Orogastric tube placement. EXAM: ABDOMEN - 1 VIEW COMPARISON:  06/01/2016. FINDINGS: Orogastric tube tip in the proximal stomach and side hole in the proximal to mid stomach. Gaseous distention of the stomach. Again demonstrated are 3 large gallstones in the gallbladder measuring up to 2.6 cm in maximum diameter each. No bowel gas visualized except for the gas in the stomach. Patchy opacity in the left lower lobe. Lumbar spine degenerative changes and minimal scoliosis. IMPRESSION: 1. Gaseous distention of the stomach with no other bowel gas seen. This is concerning for gastric outlet obstruction. 2. Cholelithiasis. 3. Patchy left lower lobe atelectasis or pneumonia. Electronically Signed   By: Claudie Revering M.D.   On: 10/06/2017 19:55   US Renal  Result Date: 10/06/2017 CLINICAL DATA:  67 year old male with acute renal failure. EXAM: RENAL / URINARY TRACT ULTRASOUND COMPLETE COMPARISON:  10/06/2016 ultrasound and 03/06/2017 MR FINDINGS: Right Kidney: Length: 12.1 cm. Diffuse increased echogenicity noted. No solid mass or hydronephrosis noted. Left Kidney: Length: 12.4 cm. Diffuse increased echogenicity noted. No solid mass or hydronephrosis identified. A 1.5 cm cyst is again identified. Bladder: A Foley catheter is present within a collapsed bladder. Incidental note is made of ascites. IMPRESSION: 1. Echogenic kidneys bilaterally compatible with medical renal disease. No evidence of hydronephrosis. 2. Ascites Electronically Signed   By: Margarette Canada M.D.   On: 10/06/2017 16:41   Ir Fluoro Guide Cv Line Right  Result Date: 10/18/2017 INDICATION: End-stage renal disease EXAM: TUNNELED DIALYSIS CATHETER PLACEMENT, ULTRASOUND GUIDANCE FOR VASCULAR ACCESS MEDICATIONS: Ancef 2 g; The antibiotic was administered within an appropriate time interval prior to skin puncture. ANESTHESIA/SEDATION: Versed  0.5 mg IV; Fentanyl 25 mcg IV; Moderate Sedation Time:  12 minutes The patient was continuously monitored during the procedure by the interventional radiology nurse under my direct supervision. FLUOROSCOPY TIME:  Fluoroscopy Time:  minutes  seconds ( mGy). COMPLICATIONS: None immediate. PROCEDURE: Informed written consent was obtained from the patient after a thorough discussion of the procedural risks, benefits and alternatives. All questions were addressed. Maximal Sterile Barrier Technique was utilized including caps, mask, sterile gowns, sterile gloves, sterile drape, hand hygiene and skin antiseptic. A timeout was performed prior to the initiation of the procedure. The right neck was prepped with ChloraPrep in a sterile fashion, and a sterile drape was applied covering the operative field. A sterile gown and sterile gloves were used for the procedure. 1% lidocaine into the skin and subcutaneous tissue. The right jugular vein was noted to be patent initially with ultrasound. Under sonographic guidance, a micropuncture needle was inserted into the right IJ vein (Ultrasound and fluoroscopic image documentation was performed). It was removed over an 018 wire which was up-sized to an Amplatz. This was advanced into the IVC. A small incision was made in the right upper chest. The tunneling device was utilized to advance the 23 centimeter tip to cuff catheter from the chest incision and out the neck incision. A peel-away sheath was advanced over the Amplatz wire. The leading edge of the catheter was then advanced through the peel-away sheath. The peel-away sheath was removed. It was flushed and instilled with heparin. The chest incision was closed with a 0 Prolene pursestring stitch. The neck incision was closed with a 4-0 Vicryl subcuticular stitch. IMPRESSION: Successful right IJ vein tunneled dialysis catheter with its tip in the right atrium. Electronically Signed   By: Marybelle Killings M.D.   On: 10/18/2017 14:53    Ir US Guide Vasc Access Right  Result Date: 10/18/2017 INDICATION: End-stage renal disease EXAM: TUNNELED DIALYSIS CATHETER PLACEMENT, ULTRASOUND GUIDANCE FOR VASCULAR ACCESS MEDICATIONS: Ancef 2 g; The antibiotic was administered within an appropriate time interval prior to skin puncture. ANESTHESIA/SEDATION: Versed 0.5 mg IV; Fentanyl 25 mcg IV; Moderate Sedation Time:  12 minutes The patient was continuously monitored during the procedure by the interventional radiology nurse under my direct supervision. FLUOROSCOPY TIME:  Fluoroscopy Time:  minutes  seconds ( mGy). COMPLICATIONS: None immediate. PROCEDURE: Informed written consent was obtained from the patient after a thorough discussion of the procedural risks, benefits and alternatives. All questions were addressed. Maximal Sterile Barrier Technique was utilized including caps, mask, sterile gowns, sterile gloves, sterile drape, hand hygiene and skin antiseptic. A timeout was performed prior to the initiation of the procedure. The right neck was prepped with ChloraPrep in a sterile fashion, and a sterile drape was applied covering the operative field. A sterile gown and sterile gloves were used for the procedure. 1% lidocaine into the skin and subcutaneous tissue. The right jugular vein was noted to be patent initially with ultrasound. Under sonographic guidance, a micropuncture needle was inserted into the right IJ vein (Ultrasound and fluoroscopic image documentation was performed). It was removed over an 018 wire which was up-sized to an Amplatz. This was advanced into the IVC. A small incision was made in the right upper chest. The tunneling device was utilized to advance the 23 centimeter tip to cuff catheter from the chest incision and out the neck incision. A peel-away sheath was advanced over the Amplatz wire. The leading edge of the catheter was then advanced through the peel-away sheath. The peel-away sheath was removed. It was flushed and  instilled with  heparin. The chest incision was closed with a 0 Prolene pursestring stitch. The neck incision was closed with a 4-0 Vicryl subcuticular stitch. IMPRESSION: Successful right IJ vein tunneled dialysis catheter with its tip in the right atrium. Electronically Signed   By: Marybelle Killings M.D.   On: 10/18/2017 14:53   Dg Chest Port 1 View  Result Date: 10/09/2017 CLINICAL DATA:  Shortness of breath EXAM: PORTABLE CHEST 1 VIEW COMPARISON:  10/08/2017 FINDINGS: Cardiac shadow remains mildly enlarged. Endotracheal tube, nasogastric catheter and left jugular temporary dialysis catheter are again seen and stable. The lungs are hypoinflated. Some persistent left retrocardiac density is noted. No new focal abnormality is noted. IMPRESSION: No significant change from the previous day. Electronically Signed   By: Inez Catalina M.D.   On: 10/09/2017 07:10   Dg Chest Port 1 View  Result Date: 10/08/2017 CLINICAL DATA:  Endotracheal tube placement. EXAM: PORTABLE CHEST 1 VIEW COMPARISON:  Oct 07, 2017 FINDINGS: Mediastinal contour is stable. The heart size is enlarged. Endotracheal tube is identified distal tip 4.9 cm from carina. A left central venous line is identified distal tip in the superior vena cava. A nasogastric tube is noted with distal tip not included on film but is probably in the stomach. Mild patchy opacities identified in the left lung base. There is no pulmonary edema or pleural effusion. The visualized skeletal structures are stable. IMPRESSION: Life supporting devices as described. Endotracheal tube in good position. Cardiomegaly. Patchy opacity of the medial left lung base probably due to atelectasis but superimposed pneumonia is not excluded. Electronically Signed   By: Abelardo Diesel M.D.   On: 10/08/2017 08:03   Dg Chest Port 1 View  Result Date: 10/07/2017 CLINICAL DATA:  Respiratory failure EXAM: PORTABLE CHEST 1 VIEW COMPARISON:  Oct 06, 2017 FINDINGS: The ETT, left central line, and  NG tube are again identified and stable within visualize limits. No pneumothorax. Stable cardiomegaly. The hila and mediastinum are unchanged. Mild opacity in the medial right lung base may represent atelectasis, vascular crowding, or subtle infiltrate. Recommend attention on follow-up. No other acute abnormalities. IMPRESSION: 1. Stable support apparatus as above. 2. Mild increased opacity in the medial right lung base could represent vascular crowding, atelectasis, or subtle infiltrate. Recommend attention on follow-up. Electronically Signed   By: Dorise Bullion III M.D   On: 10/07/2017 07:47   Dg Chest Port 1 View  Result Date: 10/06/2017 CLINICAL DATA:  Intubated. EXAM: PORTABLE CHEST 1 VIEW COMPARISON:  Earlier today. FINDINGS: Endotracheal tube tip 3.6 cm above the carina. Left jugular catheter unchanged. Stable enlarged cardiac silhouette and prominent interstitial markings. Mild increase in patchy opacity in the left lower lobe. Minimal right basilar atelectasis with improvement. No acute bony abnormality. IMPRESSION: 1. Endotracheal tube tip 3.5 cm above the carina. This could be retracted 3.5 cm to place it at the level of the clavicles. 2. Mildly increased patchy atelectasis or pneumonia in the left lower lobe. 3. Minimal right basilar atelectasis with improvement. 4. Stable cardiomegaly and chronic interstitial lung disease. Electronically Signed   By: Claudie Revering M.D.   On: 10/06/2017 19:53   Dg Chest Port 1 View  Result Date: 10/06/2017 CLINICAL DATA:  Encounter for central line placement. EXAM: PORTABLE CHEST 1 VIEW COMPARISON:  Radiograph of same day. FINDINGS: Stable cardiomegaly. Hypoinflation of the lungs is noted with mild bibasilar subsegmental atelectasis. Interval placement of left internal jugular dialysis catheter with distal tip in expected position of the SVC. No pneumothorax or  significant pleural effusion is noted. Bony thorax is unremarkable. IMPRESSION: Interval placement of  left internal jugular dialysis catheter with distal tip in expected position of SVC. No pneumothorax is noted. Hypoinflation of the lungs with mild bibasilar subsegmental atelectasis. Electronically Signed   By: Marijo Conception, M.D.   On: 10/06/2017 15:23   Dg Chest Portable 1 View  Result Date: 10/06/2017 CLINICAL DATA:  67 year old male with a history of hypotension EXAM: PORTABLE CHEST 1 VIEW COMPARISON:  05/23/2017, 10/08/2016 FINDINGS: Cardiomediastinal silhouette unchanged, likely accentuated by apical lordotic positioning. Persisting cardiomegaly. No pneumothorax.  No large pleural effusion.  Low lung volumes. IMPRESSION: Redemonstration of cardiomegaly. The accentuated appearance is favored to be secondary to the apical lordotic positioning, however, new pericardial effusion not excluded. If there is concern for pericardial effusion, recommend correlation with ECHO. No evidence of lobar pneumonia or overt edema. Electronically Signed   By: Corrie Mckusick D.O.   On: 10/06/2017 12:15   Dg Abd Portable 1v  Result Date: 10/08/2017 CLINICAL DATA:  Gastric outlet obstruction EXAM: PORTABLE ABDOMEN - 1 VIEW COMPARISON:  Portable exam 1118 hours compared to 10/06/2017 FINDINGS: Nasogastric tube coiled in stomach, which is decompressed. Air-filled loops of nonspecific small bowel mid abdomen. No bowel wall thickening. Paucity of colonic gas. Multiple calcified gallstones RIGHT upper quadrant. Bones demineralized. IMPRESSION: Cholelithiasis. Decompressed stomach versus prior exam. Nonspecific mildly dilated air-filled loops of small bowel in the mid abdomen with paucity of colonic gas, nonspecific, could reflect ileus or even potentially small-bowel obstruction; further imaging as clinically warranted. Electronically Signed   By: Lavonia Dana M.D.   On: 10/08/2017 11:29    Lab Data:  CBC: Recent Labs  Lab 10/16/17 0416 10/17/17 0310 10/18/17 0513 10/20/17 0253 10/21/17 0312  WBC 4.2 4.3 4.8 3.2*  3.4*  NEUTROABS  --   --   --  2.0 1.9  HGB 9.3* 9.2* 9.2* 8.9* 9.1*  HCT 29.1* 29.0* 29.6* 28.7* 29.3*  MCV 87.7 87.3 88.1 88.0 87.7  PLT 94* 58* 66* 86* 497*   Basic Metabolic Panel: Recent Labs  Lab 10/15/17 0434 10/16/17 0416 10/17/17 0310 10/18/17 0513 10/19/17 0300 10/20/17 0253 10/21/17 0312  NA  --  136 136 136 136 136 134*  K  --  4.3 3.9 4.1 3.5 3.7 3.7  CL  --  97* 98* 98* 97* 96* 96*  CO2  --  '27 28 27 30 29 29  ' GLUCOSE  --  90 82 82 88 81 88  BUN  --  31* 18 31* 18 31* 21*  CREATININE  --  4.74* 3.42* 4.52* 3.17* 4.30* 3.23*  CALCIUM  --  8.8* 8.6* 8.7* 8.4* 8.5* 8.5*  MG 1.9 1.9 1.7  --   --   --   --   PHOS  --  3.6 2.8 3.6 3.3 3.9 3.2   GFR: Estimated Creatinine Clearance: 21.5 mL/min (A) (by C-G formula based on SCr of 3.23 mg/dL (H)). Liver Function Tests: Recent Labs  Lab 10/17/17 0310 10/18/17 0513 10/19/17 0300 10/20/17 0253 10/21/17 0312  ALBUMIN 2.3* 2.3* 2.2* 2.2* 2.3*   No results for input(s): LIPASE, AMYLASE in the last 168 hours. No results for input(s): AMMONIA in the last 168 hours. Coagulation Profile: Recent Labs  Lab 10/17/17 1345  INR 1.47   Cardiac Enzymes: No results for input(s): CKTOTAL, CKMB, CKMBINDEX, TROPONINI in the last 168 hours. BNP (last 3 results) No results for input(s): PROBNP in the last 8760 hours. HbA1C: No results  for input(s): HGBA1C in the last 72 hours. CBG: Recent Labs  Lab 10/15/17 1954 10/15/17 2340 10/16/17 0318 10/16/17 2013 10/16/17 2306  GLUCAP 89 105* 73 87 81   Lipid Profile: No results for input(s): CHOL, HDL, LDLCALC, TRIG, CHOLHDL, LDLDIRECT in the last 72 hours. Thyroid Function Tests: No results for input(s): TSH, T4TOTAL, FREET4, T3FREE, THYROIDAB in the last 72 hours. Anemia Panel: No results for input(s): VITAMINB12, FOLATE, FERRITIN, TIBC, IRON, RETICCTPCT in the last 72 hours. Urine analysis:    Component Value Date/Time   COLORURINE RED (A) 10/06/2017 1101    APPEARANCEUR TURBID (A) 10/06/2017 1101   LABSPEC  10/06/2017 1101    TEST NOT REPORTED DUE TO COLOR INTERFERENCE OF URINE PIGMENT   PHURINE  10/06/2017 1101    TEST NOT REPORTED DUE TO COLOR INTERFERENCE OF URINE PIGMENT   GLUCOSEU (A) 10/06/2017 1101    TEST NOT REPORTED DUE TO COLOR INTERFERENCE OF URINE PIGMENT   HGBUR (A) 10/06/2017 1101    TEST NOT REPORTED DUE TO COLOR INTERFERENCE OF URINE PIGMENT   HGBUR negative 12/26/2008 1541   BILIRUBINUR (A) 10/06/2017 1101    TEST NOT REPORTED DUE TO COLOR INTERFERENCE OF URINE PIGMENT   KETONESUR (A) 10/06/2017 1101    TEST NOT REPORTED DUE TO COLOR INTERFERENCE OF URINE PIGMENT   PROTEINUR (A) 10/06/2017 1101    TEST NOT REPORTED DUE TO COLOR INTERFERENCE OF URINE PIGMENT   UROBILINOGEN 1.0 04/03/2015 1725   NITRITE (A) 10/06/2017 1101    TEST NOT REPORTED DUE TO COLOR INTERFERENCE OF URINE PIGMENT   LEUKOCYTESUR (A) 10/06/2017 1101    TEST NOT REPORTED DUE TO COLOR INTERFERENCE OF URINE PIGMENT     Naithan Delage M.D. [facility] 10/21/2017, 12:26 PM  Pager: [phone] Between 7am to 7pm - call Pager - [phone]  After 7pm go to [email or web address] - password [email or web address]  Call night coverage person covering after 7pm

## 2017-10-21 NOTE — Progress Notes (Signed)
Medication and warm compress was effective at this time patient voiced no c/o discomfort.

## 2017-10-21 NOTE — Progress Notes (Signed)
Patient had c/o discomfort to left wrist not sure if he laid on it too long.  Given pain medication and warm compress.  Patient able to move hand and flex wrist without difficulty.  Will monitor.

## 2017-10-21 NOTE — Progress Notes (Signed)
[facility] Rounding Note  Background 67 y.o. yo male NHP, wheelchair bound, PMH CAD, HTN, HLD, BPH, CM with EF 35%, RV and LV dysfunction,  baseline CKD3 (creatinine 1.48 - 1.78 and on losartan outpt). Admitted 10/06/2017 with creatinine 14, AMS -> urosepsis and AKI (ATN felt 2/2 septic shock). 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 Renal US 5/10 12.1, 12.4 echodense. Of note had chronic indwelling foley since May 2018 due to inability to empty bladder, removed this admission, doing daily bladder scans to monitor for UOP. 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 AKI on CKD3 - 2/2 ATN/septic shock from enterobacter urosepsis. CRRT 5/10-5/13, IHD 5/16, 5/18, 5/20, 5/22, 5/24 No evidence return of renal function yet. Daily bladder scans - today 98cc...  1. We have not made plans for HD on the outside for him as still hoping for some recovery, but with persistent anuria looking less optimistic  2. Indwelling foley since May 2018 (removed this admission)  for inability to pass urine on his own (sounds like BPH). Doing daily bladder scans now as this inability to void issue could mask renal recovery. (but no sig urine yet) 3. HD again on Monday planned  2. Urosepsis- enterobacter- meropenem finished 5/18.  3. Anemia- on ESA- no iron needed.  Transfused 5/16. Gets Aranesp 200 QMonday. Missed dose on 5/20 Reordered to get with Monday HD  4. Thrombocytopenia - acute on chronic. Had been attributed to sepsis but persistently low. Had been getting tight heparin with HD. 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 In the meantime no heparin, citrate block for TDC. 5. PAF/bradycardia - no anticoag at present 2/2 low plts  Jamal Maes, MD [facility] [phone] Pager 10/21/2017, 10:01 AM   Subjective: TDC replaced temp cath (IR 5/22)  Still no urine on bladder scans so no need for foley replacement yet (98 cc  today) No kidney recovery yet  Objective Vital signs in last 24 hours: Vitals:   10/20/17 2327 10/21/17 0302 10/21/17 0554 10/21/17 0733  BP: 124/78 118/64  115/73  Pulse: 83 87  81  Resp: (!) 21 20  18   Temp: 98 F (36.7 C) 98.1 F (36.7 C)  98.3 F (36.8 C)  TempSrc: Oral Oral  Oral  SpO2: 98% 100%  100%  Weight:   80.5 kg (177 lb 8 oz)   Height:       Weight change: -2 kg (-4 lb 6.6 oz)  Intake/Output Summary (Last 24 hours) at 10/21/2017 1001 Last data filed at 10/21/2017 0600 Gross per 24 hour  Intake 1077 ml  Output 4250 ml  Net -3173 ml   Physical Exam: Very nice man  Lungs with decr BS bases  S1S2 No S3 1-2/6 murmur LSB Abd soft Colostomy bag, brown stool, hernia 1-2+ dependent pitting edema LE's some improved, still some pitting of lateral abd wall  TDC R IJ (5/22 IR) no issues Bladder scan 98 cc   Recent Labs  Lab 10/19/17 0300 10/20/17 0253 10/21/17 0312  NA 136 136 134*  K 3.5 3.7 3.7  CL 97* 96* 96*  CO2 30 29 29   GLUCOSE 88 81 88  BUN 18 31* 21*  CREATININE 3.17* 4.30* 3.23*  CALCIUM 8.4* 8.5* 8.5*  PHOS 3.3 3.9 3.2    Recent Labs  Lab 10/19/17 0300 10/20/17 0253 10/21/17 0312  ALBUMIN 2.2* 2.2* 2.3*    Recent Labs  Lab 10/16/17 0416 10/17/17  0310 10/18/17 0513 10/20/17 0253 10/21/17 0312  WBC 4.2 4.3 4.8 3.2* 3.4*  NEUTROABS  --   --   --  2.0 1.9  HGB 9.3* 9.2* 9.2* 8.9* 9.1*  HCT 29.1* 29.0* 29.6* 28.7* 29.3*  MCV 87.7 87.3 88.1 88.0 87.7  PLT 94* 58* 66* 86* 106*    Recent Labs  Lab 10/15/17 1954 10/15/17 2340 10/16/17 0318 10/16/17 2013 10/16/17 2306  GLUCAP 89 105* 73 87 81   Lab Results  Component Value Date   IRON 45 10/11/2017   TIBC 148 (L) 10/11/2017   FERRITIN 686 (H) 10/11/2017        Transferrin saturation                                   30%  Scheduled Medications: . Chlorhexidine Gluconate Cloth  6 each Topical Daily  . Chlorhexidine Gluconate Cloth  6 each Topical Q0600  . darbepoetin  (ARANESP) injection - NON-DIALYSIS  200 mcg Subcutaneous Q Mon-1800  . feeding supplement (NEPRO CARB STEADY)  237 mL Oral TID BM  . pantoprazole  40 mg Oral Daily  . sodium chloride flush  10-40 mL Intracatheter Q12H  . sodium chloride flush  3 mL Intravenous Q12H   . sodium chloride

## 2017-10-22 ENCOUNTER — Inpatient Hospital Stay (HOSPITAL_COMMUNITY): Payer: Medicare Other

## 2017-10-22 DIAGNOSIS — M7989 Other specified soft tissue disorders: Secondary | ICD-10-CM

## 2017-10-22 DIAGNOSIS — M79609 Pain in unspecified limb: Secondary | ICD-10-CM

## 2017-10-22 LAB — CBC WITH DIFFERENTIAL/PLATELET
ABS IMMATURE GRANULOCYTES: 0 10*3/uL (ref 0.0–0.1)
BASOS ABS: 0.1 10*3/uL (ref 0.0–0.1)
Basophils Relative: 1 %
EOS PCT: 1 %
Eosinophils Absolute: 0.1 10*3/uL (ref 0.0–0.7)
HEMATOCRIT: 30.3 % — AB (ref 39.0–52.0)
HEMOGLOBIN: 9.3 g/dL — AB (ref 13.0–17.0)
Immature Granulocytes: 0 %
LYMPHS PCT: 10 %
Lymphs Abs: 0.5 10*3/uL — ABNORMAL LOW (ref 0.7–4.0)
MCH: 26.9 pg (ref 26.0–34.0)
MCHC: 30.7 g/dL (ref 30.0–36.0)
MCV: 87.6 fL (ref 78.0–100.0)
Monocytes Absolute: 1.1 10*3/uL — ABNORMAL HIGH (ref 0.1–1.0)
Monocytes Relative: 23 %
NEUTROS ABS: 2.9 10*3/uL (ref 1.7–7.7)
Neutrophils Relative %: 65 %
Platelets: 118 10*3/uL — ABNORMAL LOW (ref 150–400)
RBC: 3.46 MIL/uL — ABNORMAL LOW (ref 4.22–5.81)
RDW: 20.7 % — ABNORMAL HIGH (ref 11.5–15.5)
WBC: 4.5 10*3/uL (ref 4.0–10.5)

## 2017-10-22 LAB — RENAL FUNCTION PANEL
ALBUMIN: 2.3 g/dL — AB (ref 3.5–5.0)
ANION GAP: 12 (ref 5–15)
BUN: 30 mg/dL — ABNORMAL HIGH (ref 6–20)
CO2: 26 mmol/L (ref 22–32)
Calcium: 8.6 mg/dL — ABNORMAL LOW (ref 8.9–10.3)
Chloride: 95 mmol/L — ABNORMAL LOW (ref 101–111)
Creatinine, Ser: 4.44 mg/dL — ABNORMAL HIGH (ref 0.61–1.24)
GFR calc Af Amer: 14 mL/min — ABNORMAL LOW (ref >60)
GFR, EST NON AFRICAN AMERICAN: 12 mL/min — AB (ref >60)
Glucose, Bld: 99 mg/dL (ref 65–99)
PHOSPHORUS: 4.4 mg/dL (ref 2.5–4.6)
POTASSIUM: 3.7 mmol/L (ref 3.5–5.1)
Sodium: 133 mmol/L — ABNORMAL LOW (ref 135–145)

## 2017-10-22 NOTE — Progress Notes (Signed)
VASCULAR LAB PRELIMINARY  PRELIMINARY  PRELIMINARY  PRELIMINARY  Left upper extremity venous duplex completed.    Preliminary report:  There is no obvious evidence of DVT or SVT noted in the left upper extremity.  There is fluid noted in the left hand and wrist, etiology unknown.   Erion Weightman, RVT 10/22/2017, 8:55 AM

## 2017-10-22 NOTE — Progress Notes (Signed)
Orthopedic Tech Progress Note Patient Details:  MCDONALD REILING 1950-06-04 [id number]  Ortho Devices Type of Ortho Device: Velcro wrist splint Ortho Device/Splint Location: lue Ortho Device/Splint Interventions: Application   Post Interventions Patient Tolerated: Well Instructions Provided: Care of device   Hildred Priest 10/22/2017, 1:06 PM

## 2017-10-22 NOTE — Progress Notes (Signed)
[facility]                                                                              Patient Demographics  Patrick [name], is a 67 y.o. male, DOB - [birth year], JSE:[id number]  Admit date - 10/06/2017   Admitting Physician Chesley Mires, MD  Outpatient Primary MD for the patient is Patient, [name]  Outpatient specialists:   LOS - 16  days   Medical records reviewed and are as summarized below:    Chief Complaint  Patient presents with  . Abnormal Lab       Brief summary   67 yo M SNF resident w/ a Hx CAD, Chronic Systolic CHF (EF 07-37%), remote Colon Cancer w/ perm end colostomy in 2018, CKD Stage III-IV, SBO, and an enlarged prostate who presented to the ED 5/10 after being found w/ altered LOC and several lab abnormalities including cr 13.35 and K 5.76m Hgb 7.5, and hypoglycemia. He was also hypotensive w/ SBP in 70s. He initially underwent CRRT. Now getting intermittent dialysis. 5/10 admit - transfused 1U PRBC - Vascath placed - CRRT initiated 5/13 CRRT stopped  5/11 TTE - diffuse hypokinesis - EF 35-40% - mild mitral regurg 5/16 IHD Patient was transferred to hospitalist service on 10/11/2017  Assessment & Plan    Acute on chronic CKD stage III/IV -Presented with creatinine of 14.4, baseline creatinine 1.6.  Likely due to ATN in the setting of septic shock -Nephrology was consulted and was placed on CRRT. -Currently on intermittent hemodialysis, Right IJ HD catheter placed on 5/22 by IR, underwent hemodialysis on 5/24 -No acute issues, HD on Monday, CLIP to be placed  Left wrist pain likely due to severe osteoarthritis -Has swelling in the left hand/wrist, Doppler ultrasound negative for DVT.  X-ray consistent with osteoarthritis.  Uric acid low, ERCP, CRP mildly elevated.  -To new prednisone 40 mg daily for 3 days, placed wrist splint, K pad   Enterobacter UTI -Finished meropenem on 5/18   Anemia of chronic disease -Likely due to CKD,  transfused on 5/16, on Aranesp weekly -Has received 4 units of packed RBCs this admission.  -Hemoglobin stable  Thrombocytopenia, acute on chronic, possibly worsened due to sepsis -DIC panel equivocal, unlikely TTP, negative schistocytes. - no heparin products, SRA test pending, if positive, may need to place on argatroban or Arixtra -Platelets continues to improve   Acute metabolic encephalopathy likely due to #1 -Resolved, alert and oriented x3  Hypovolemic shock -Likely due to #1, shock physiology resolved  Paroxysmal atrial fibrillation, bradycardia with frequent pauses - Not on anticoagulation due to thrombocytopenia, was on apixaban previously  History of colon CA status post colostomy  Code Status: partial  DVT Prophylaxis:   SCD's Family Communication: Discussed in detail with the patient, all imaging results, lab results explained to the patient    Disposition Plan: Awaiting plans from nephrology regarding permanent dialysis  Time Spent in minutes   25 minutes  Procedures:  5/13 CRRT stopped  5/11 TTE - diffuse hypokinesis - EF 35-40% - mild mitral regurg 5/16 IHD  Consultants:   PCCM Nephrology   Antimicrobials:  Medications  Scheduled Meds: . Chlorhexidine Gluconate Cloth  6 each Topical Daily  . Chlorhexidine Gluconate Cloth  6 each Topical Q0600  . [START ON 10/23/2017] darbepoetin (ARANESP) injection - DIALYSIS  200 mcg Intravenous Q Mon-HD  . feeding supplement (NEPRO CARB STEADY)  237 mL Oral TID BM  . pantoprazole  40 mg Oral Daily  . predniSONE  40 mg Oral Q breakfast  . sodium chloride flush  10-40 mL Intracatheter Q12H  . sodium chloride flush  3 mL Intravenous Q12H   Continuous Infusions: . sodium chloride     PRN Meds:.sodium chloride, baclofen, oxyCODONE-acetaminophen, sodium chloride flush, sodium chloride flush   Antibiotics   Anti-infectives (From admission, onward)   Start     Dose/Rate Route Frequency Ordered Stop    10/18/17 1321  ceFAZolin (ANCEF) 2-4 GM/100ML-% IVPB    Note to Pharmacy:  Cecile Sheerer   : cabinet override      10/18/17 1321 10/19/17 0129   10/18/17 0800  ceFAZolin (ANCEF) IVPB 2g/100 mL premix     2 g 200 mL/hr over 30 Minutes Intravenous On call 10/17/17 1154 10/18/17 1400   10/10/17 2200  meropenem (MERREM) 500 mg in sodium chloride 0.9 % 100 mL IVPB     500 mg 200 mL/hr over 30 Minutes Intravenous Every 24 hours 10/10/17 1022 10/14/17 2235   10/08/17 1200  meropenem (MERREM) 1 g in sodium chloride 0.9 % 100 mL IVPB  Status:  Discontinued     1 g 200 mL/hr over 30 Minutes Intravenous Every 12 hours 10/08/17 1050 10/10/17 1022   10/07/17 1600  vancomycin (VANCOCIN) IVPB 1000 mg/200 mL premix  Status:  Discontinued     1,000 mg 200 mL/hr over 60 Minutes Intravenous Every 24 hours 10/06/17 2007 10/08/17 1050   10/07/17 0400  piperacillin-tazobactam (ZOSYN) IVPB 3.375 g  Status:  Discontinued     3.375 g 100 mL/hr over 30 Minutes Intravenous Every 6 hours 10/06/17 2313 10/08/17 1050   10/06/17 2000  piperacillin-tazobactam (ZOSYN) IVPB 3.375 g  Status:  Discontinued     3.375 g 12.5 mL/hr over 240 Minutes Intravenous Every 6 hours 10/06/17 1955 10/06/17 2313   10/06/17 1615  vancomycin (VANCOCIN) 2,000 mg in sodium chloride 0.9 % 500 mL IVPB     2,000 mg 250 mL/hr over 120 Minutes Intravenous  Once 10/06/17 1602 10/06/17 1815   10/06/17 1600  piperacillin-tazobactam (ZOSYN) IVPB 3.375 g     3.375 g 100 mL/hr over 30 Minutes Intravenous  Once 10/06/17 1602 10/06/17 1706   10/06/17 1130  cefTRIAXone (ROCEPHIN) 1 g in sodium chloride 0.9 % 100 mL IVPB  Status:  Discontinued     1 g 200 mL/hr over 30 Minutes Intravenous Every 24 hours 10/06/17 1101 10/06/17 1516   10/06/17 0400  piperacillin-tazobactam (ZOSYN) IVPB 3.375 g  Status:  Discontinued     3.375 g 12.5 mL/hr over 240 Minutes Intravenous Every 12 hours 10/06/17 1602 10/06/17 1955        Subjective:   Patrick [name] was  seen and examined today.  Continues to complain of left wrist pain and swelling.  No fevers or chills.   Patient denies dizziness, chest pain or shortness of breath abdominal pain, N/V.   Objective:   Vitals:   10/22/17 0346 10/22/17 0427 10/22/17 0744 10/22/17 1129  BP:   114/75 130/81  Pulse: 80  81 76  Resp: 18  16 (!) 21  Temp:   98.4 F (36.9 C) 98.4 F (  36.9 C)  TempSrc:   Oral Oral  SpO2: 99%  100% 100%  Weight:  79.7 kg (175 lb 12.8 oz)    Height:        Intake/Output Summary (Last 24 hours) at 10/22/2017 1201 Last data filed at 10/22/2017 0346 Gross per 24 hour  Intake 369 ml  Output 150 ml  Net 219 ml     Wt Readings from Last 3 Encounters:  10/22/17 79.7 kg (175 lb 12.8 oz)  08/29/17 97.5 kg (215 lb)  05/26/17 93.7 kg (206 lb 9.6 oz)     Exam  General: Alert and oriented x 3, NAD Eyes:  HEENT:  Atraumatic, normocephalic Cardiovascular: S1 S2 auscultated,  Regular rate and rhythm.  2+ pedal edema b/l Respiratory: CTAB Gastrointestinal: Soft, nontender, nondistended, + bowel sounds Ext: 2+ pedal edema bilaterally, left wrist hand swollen, tender Neuro: no new deficits Musculoskeletal: No digital cyanosis, clubbing Skin: No rashes Psych: Normal affect and demeanor, alert and oriented x3      Data Reviewed:  I have personally reviewed following labs and imaging studies  Micro Results No results found for this or any previous visit (from the past 240 hour(s)).  Radiology Reports Dg Wrist Complete Left  Result Date: 10/21/2017 CLINICAL DATA:  Acute left wrist pain without known injury. EXAM: LEFT WRIST - COMPLETE 3+ VIEW COMPARISON:  None. FINDINGS: There is no evidence of fracture or dislocation. Moderate narrowing and sclerosis is seen involving the first carpometacarpal joint. Soft tissues are unremarkable. IMPRESSION: Osteoarthritis of the first carpometacarpal joint. No acute abnormality seen in the left wrist. Electronically Signed   By: Marijo Conception, M.D.   On: 10/21/2017 09:48   Dg Abd 1 View  Result Date: 10/06/2017 CLINICAL DATA:  Orogastric tube placement. EXAM: ABDOMEN - 1 VIEW COMPARISON:  06/01/2016. FINDINGS: Orogastric tube tip in the proximal stomach and side hole in the proximal to mid stomach. Gaseous distention of the stomach. Again demonstrated are 3 large gallstones in the gallbladder measuring up to 2.6 cm in maximum diameter each. No bowel gas visualized except for the gas in the stomach. Patchy opacity in the left lower lobe. Lumbar spine degenerative changes and minimal scoliosis. IMPRESSION: 1. Gaseous distention of the stomach with no other bowel gas seen. This is concerning for gastric outlet obstruction. 2. Cholelithiasis. 3. Patchy left lower lobe atelectasis or pneumonia. Electronically Signed   By: Claudie Revering M.D.   On: 10/06/2017 19:55   US Renal  Result Date: 10/06/2017 CLINICAL DATA:  67 year old male with acute renal failure. EXAM: RENAL / URINARY TRACT ULTRASOUND COMPLETE COMPARISON:  10/06/2016 ultrasound and 03/06/2017 MR FINDINGS: Right Kidney: Length: 12.1 cm. Diffuse increased echogenicity noted. No solid mass or hydronephrosis noted. Left Kidney: Length: 12.4 cm. Diffuse increased echogenicity noted. No solid mass or hydronephrosis identified. A 1.5 cm cyst is again identified. Bladder: A Foley catheter is present within a collapsed bladder. Incidental note is made of ascites. IMPRESSION: 1. Echogenic kidneys bilaterally compatible with medical renal disease. No evidence of hydronephrosis. 2. Ascites Electronically Signed   By: Margarette Canada M.D.   On: 10/06/2017 16:41   Ir Fluoro Guide Cv Line Right  Result Date: 10/18/2017 INDICATION: End-stage renal disease EXAM: TUNNELED DIALYSIS CATHETER PLACEMENT, ULTRASOUND GUIDANCE FOR VASCULAR ACCESS MEDICATIONS: Ancef 2 g; The antibiotic was administered within an appropriate time interval prior to skin puncture. ANESTHESIA/SEDATION: Versed 0.5 mg IV; Fentanyl  25 mcg IV; Moderate Sedation Time:  12 minutes The patient was continuously  monitored during the procedure by the interventional radiology nurse under my direct supervision. FLUOROSCOPY TIME:  Fluoroscopy Time:  minutes  seconds ( mGy). COMPLICATIONS: None immediate. PROCEDURE: Informed written consent was obtained from the patient after a thorough discussion of the procedural risks, benefits and alternatives. All questions were addressed. Maximal Sterile Barrier Technique was utilized including caps, mask, sterile gowns, sterile gloves, sterile drape, hand hygiene and skin antiseptic. A timeout was performed prior to the initiation of the procedure. The right neck was prepped with ChloraPrep in a sterile fashion, and a sterile drape was applied covering the operative field. A sterile gown and sterile gloves were used for the procedure. 1% lidocaine into the skin and subcutaneous tissue. The right jugular vein was noted to be patent initially with ultrasound. Under sonographic guidance, a micropuncture needle was inserted into the right IJ vein (Ultrasound and fluoroscopic image documentation was performed). It was removed over an 018 wire which was up-sized to an Amplatz. This was advanced into the IVC. A small incision was made in the right upper chest. The tunneling device was utilized to advance the 23 centimeter tip to cuff catheter from the chest incision and out the neck incision. A peel-away sheath was advanced over the Amplatz wire. The leading edge of the catheter was then advanced through the peel-away sheath. The peel-away sheath was removed. It was flushed and instilled with heparin. The chest incision was closed with a 0 Prolene pursestring stitch. The neck incision was closed with a 4-0 Vicryl subcuticular stitch. IMPRESSION: Successful right IJ vein tunneled dialysis catheter with its tip in the right atrium. Electronically Signed   By: Marybelle Killings M.D.   On: 10/18/2017 14:53   Ir US Guide Vasc  Access Right  Result Date: 10/18/2017 INDICATION: End-stage renal disease EXAM: TUNNELED DIALYSIS CATHETER PLACEMENT, ULTRASOUND GUIDANCE FOR VASCULAR ACCESS MEDICATIONS: Ancef 2 g; The antibiotic was administered within an appropriate time interval prior to skin puncture. ANESTHESIA/SEDATION: Versed 0.5 mg IV; Fentanyl 25 mcg IV; Moderate Sedation Time:  12 minutes The patient was continuously monitored during the procedure by the interventional radiology nurse under my direct supervision. FLUOROSCOPY TIME:  Fluoroscopy Time:  minutes  seconds ( mGy). COMPLICATIONS: None immediate. PROCEDURE: Informed written consent was obtained from the patient after a thorough discussion of the procedural risks, benefits and alternatives. All questions were addressed. Maximal Sterile Barrier Technique was utilized including caps, mask, sterile gowns, sterile gloves, sterile drape, hand hygiene and skin antiseptic. A timeout was performed prior to the initiation of the procedure. The right neck was prepped with ChloraPrep in a sterile fashion, and a sterile drape was applied covering the operative field. A sterile gown and sterile gloves were used for the procedure. 1% lidocaine into the skin and subcutaneous tissue. The right jugular vein was noted to be patent initially with ultrasound. Under sonographic guidance, a micropuncture needle was inserted into the right IJ vein (Ultrasound and fluoroscopic image documentation was performed). It was removed over an 018 wire which was up-sized to an Amplatz. This was advanced into the IVC. A small incision was made in the right upper chest. The tunneling device was utilized to advance the 23 centimeter tip to cuff catheter from the chest incision and out the neck incision. A peel-away sheath was advanced over the Amplatz wire. The leading edge of the catheter was then advanced through the peel-away sheath. The peel-away sheath was removed. It was flushed and instilled with heparin.  The chest incision was closed  with a 0 Prolene pursestring stitch. The neck incision was closed with a 4-0 Vicryl subcuticular stitch. IMPRESSION: Successful right IJ vein tunneled dialysis catheter with its tip in the right atrium. Electronically Signed   By: Marybelle Killings M.D.   On: 10/18/2017 14:53   Dg Chest Port 1 View  Result Date: 10/09/2017 CLINICAL DATA:  Shortness of breath EXAM: PORTABLE CHEST 1 VIEW COMPARISON:  10/08/2017 FINDINGS: Cardiac shadow remains mildly enlarged. Endotracheal tube, nasogastric catheter and left jugular temporary dialysis catheter are again seen and stable. The lungs are hypoinflated. Some persistent left retrocardiac density is noted. No new focal abnormality is noted. IMPRESSION: No significant change from the previous day. Electronically Signed   By: Inez Catalina M.D.   On: 10/09/2017 07:10   Dg Chest Port 1 View  Result Date: 10/08/2017 CLINICAL DATA:  Endotracheal tube placement. EXAM: PORTABLE CHEST 1 VIEW COMPARISON:  Oct 07, 2017 FINDINGS: Mediastinal contour is stable. The heart size is enlarged. Endotracheal tube is identified distal tip 4.9 cm from carina. A left central venous line is identified distal tip in the superior vena cava. A nasogastric tube is noted with distal tip not included on film but is probably in the stomach. Mild patchy opacities identified in the left lung base. There is no pulmonary edema or pleural effusion. The visualized skeletal structures are stable. IMPRESSION: Life supporting devices as described. Endotracheal tube in good position. Cardiomegaly. Patchy opacity of the medial left lung base probably due to atelectasis but superimposed pneumonia is not excluded. Electronically Signed   By: Abelardo Diesel M.D.   On: 10/08/2017 08:03   Dg Chest Port 1 View  Result Date: 10/07/2017 CLINICAL DATA:  Respiratory failure EXAM: PORTABLE CHEST 1 VIEW COMPARISON:  Oct 06, 2017 FINDINGS: The ETT, left central line, and NG tube are again  identified and stable within visualize limits. No pneumothorax. Stable cardiomegaly. The hila and mediastinum are unchanged. Mild opacity in the medial right lung base may represent atelectasis, vascular crowding, or subtle infiltrate. Recommend attention on follow-up. No other acute abnormalities. IMPRESSION: 1. Stable support apparatus as above. 2. Mild increased opacity in the medial right lung base could represent vascular crowding, atelectasis, or subtle infiltrate. Recommend attention on follow-up. Electronically Signed   By: Dorise Bullion III M.D   On: 10/07/2017 07:47   Dg Chest Port 1 View  Result Date: 10/06/2017 CLINICAL DATA:  Intubated. EXAM: PORTABLE CHEST 1 VIEW COMPARISON:  Earlier today. FINDINGS: Endotracheal tube tip 3.6 cm above the carina. Left jugular catheter unchanged. Stable enlarged cardiac silhouette and prominent interstitial markings. Mild increase in patchy opacity in the left lower lobe. Minimal right basilar atelectasis with improvement. No acute bony abnormality. IMPRESSION: 1. Endotracheal tube tip 3.5 cm above the carina. This could be retracted 3.5 cm to place it at the level of the clavicles. 2. Mildly increased patchy atelectasis or pneumonia in the left lower lobe. 3. Minimal right basilar atelectasis with improvement. 4. Stable cardiomegaly and chronic interstitial lung disease. Electronically Signed   By: Claudie Revering M.D.   On: 10/06/2017 19:53   Dg Chest Port 1 View  Result Date: 10/06/2017 CLINICAL DATA:  Encounter for central line placement. EXAM: PORTABLE CHEST 1 VIEW COMPARISON:  Radiograph of same day. FINDINGS: Stable cardiomegaly. Hypoinflation of the lungs is noted with mild bibasilar subsegmental atelectasis. Interval placement of left internal jugular dialysis catheter with distal tip in expected position of the SVC. No pneumothorax or significant pleural effusion is noted. Bony  thorax is unremarkable. IMPRESSION: Interval placement of left internal  jugular dialysis catheter with distal tip in expected position of SVC. No pneumothorax is noted. Hypoinflation of the lungs with mild bibasilar subsegmental atelectasis. Electronically Signed   By: Marijo Conception, M.D.   On: 10/06/2017 15:23   Dg Chest Portable 1 View  Result Date: 10/06/2017 CLINICAL DATA:  67 year old male with a history of hypotension EXAM: PORTABLE CHEST 1 VIEW COMPARISON:  05/23/2017, 10/08/2016 FINDINGS: Cardiomediastinal silhouette unchanged, likely accentuated by apical lordotic positioning. Persisting cardiomegaly. No pneumothorax.  No large pleural effusion.  Low lung volumes. IMPRESSION: Redemonstration of cardiomegaly. The accentuated appearance is favored to be secondary to the apical lordotic positioning, however, new pericardial effusion not excluded. If there is concern for pericardial effusion, recommend correlation with ECHO. No evidence of lobar pneumonia or overt edema. Electronically Signed   By: Corrie Mckusick D.O.   On: 10/06/2017 12:15   Dg Abd Portable 1v  Result Date: 10/08/2017 CLINICAL DATA:  Gastric outlet obstruction EXAM: PORTABLE ABDOMEN - 1 VIEW COMPARISON:  Portable exam 1118 hours compared to 10/06/2017 FINDINGS: Nasogastric tube coiled in stomach, which is decompressed. Air-filled loops of nonspecific small bowel mid abdomen. No bowel wall thickening. Paucity of colonic gas. Multiple calcified gallstones RIGHT upper quadrant. Bones demineralized. IMPRESSION: Cholelithiasis. Decompressed stomach versus prior exam. Nonspecific mildly dilated air-filled loops of small bowel in the mid abdomen with paucity of colonic gas, nonspecific, could reflect ileus or even potentially small-bowel obstruction; further imaging as clinically warranted. Electronically Signed   By: Lavonia Dana M.D.   On: 10/08/2017 11:29    Lab Data:  CBC: Recent Labs  Lab 10/17/17 0310 10/18/17 4270 10/20/17 0253 10/21/17 0312 10/22/17 0208  WBC 4.3 4.8 3.2* 3.4* 4.5  NEUTROABS   --   --  2.0 1.9 2.9  HGB 9.2* 9.2* 8.9* 9.1* 9.3*  HCT 29.0* 29.6* 28.7* 29.3* 30.3*  MCV 87.3 88.1 88.0 87.7 87.6  PLT 58* 66* 86* 106* 623*   Basic Metabolic Panel: Recent Labs  Lab 10/16/17 0416 10/17/17 0310 10/18/17 0513 10/19/17 0300 10/20/17 0253 10/21/17 0312 10/22/17 0208  NA 136 136 136 136 136 134* 133*  K 4.3 3.9 4.1 3.5 3.7 3.7 3.7  CL 97* 98* 98* 97* 96* 96* 95*  CO2 27 28 27 30 29 29 26   GLUCOSE 90 82 82 88 81 88 99  BUN 31* 18 31* 18 31* 21* 30*  CREATININE 4.74* 3.42* 4.52* 3.17* 4.30* 3.23* 4.44*  CALCIUM 8.8* 8.6* 8.7* 8.4* 8.5* 8.5* 8.6*  MG 1.9 1.7  --   --   --   --   --   PHOS 3.6 2.8 3.6 3.3 3.9 3.2 4.4   GFR: Estimated Creatinine Clearance: 15.6 mL/min (A) (by C-G formula based on SCr of 4.44 mg/dL (H)). Liver Function Tests: Recent Labs  Lab 10/18/17 0513 10/19/17 0300 10/20/17 0253 10/21/17 0312 10/22/17 0208  ALBUMIN 2.3* 2.2* 2.2* 2.3* 2.3*   No results for input(s): LIPASE, AMYLASE in the last 168 hours. No results for input(s): AMMONIA in the last 168 hours. Coagulation Profile: Recent Labs  Lab 10/17/17 1345  INR 1.47   Cardiac Enzymes: No results for input(s): CKTOTAL, CKMB, CKMBINDEX, TROPONINI in the last 168 hours. BNP (last 3 results) No results for input(s): PROBNP in the last 8760 hours. HbA1C: No results for input(s): HGBA1C in the last 72 hours. CBG: Recent Labs  Lab 10/15/17 1954 10/15/17 2340 10/16/17 7628 10/16/17 2013 10/16/17 2306  GLUCAP 89 105* 73 87 81   Lipid Profile: No results for input(s): CHOL, HDL, LDLCALC, TRIG, CHOLHDL, LDLDIRECT in the last 72 hours. Thyroid Function Tests: No results for input(s): TSH, T4TOTAL, FREET4, T3FREE, THYROIDAB in the last 72 hours. Anemia Panel: No results for input(s): VITAMINB12, FOLATE, FERRITIN, TIBC, IRON, RETICCTPCT in the last 72 hours. Urine analysis:    Component Value Date/Time   COLORURINE RED (A) 10/06/2017 1101   APPEARANCEUR TURBID (A)  10/06/2017 1101   LABSPEC  10/06/2017 1101    TEST NOT REPORTED DUE TO COLOR INTERFERENCE OF URINE PIGMENT   PHURINE  10/06/2017 1101    TEST NOT REPORTED DUE TO COLOR INTERFERENCE OF URINE PIGMENT   GLUCOSEU (A) 10/06/2017 1101    TEST NOT REPORTED DUE TO COLOR INTERFERENCE OF URINE PIGMENT   HGBUR (A) 10/06/2017 1101    TEST NOT REPORTED DUE TO COLOR INTERFERENCE OF URINE PIGMENT   HGBUR negative 12/26/2008 1541   BILIRUBINUR (A) 10/06/2017 1101    TEST NOT REPORTED DUE TO COLOR INTERFERENCE OF URINE PIGMENT   KETONESUR (A) 10/06/2017 1101    TEST NOT REPORTED DUE TO COLOR INTERFERENCE OF URINE PIGMENT   PROTEINUR (A) 10/06/2017 1101    TEST NOT REPORTED DUE TO COLOR INTERFERENCE OF URINE PIGMENT   UROBILINOGEN 1.0 04/03/2015 1725   NITRITE (A) 10/06/2017 1101    TEST NOT REPORTED DUE TO COLOR INTERFERENCE OF URINE PIGMENT   LEUKOCYTESUR (A) 10/06/2017 1101    TEST NOT REPORTED DUE TO COLOR INTERFERENCE OF URINE PIGMENT     Ripudeep Rai M.D. [facility] 10/22/2017, 12:01 PM  Pager: [phone] Between 7am to 7pm - call Pager - [phone]  After 7pm go to [email or web address] - password [email or web address]  Call night coverage person covering after 7pm

## 2017-10-23 ENCOUNTER — Inpatient Hospital Stay (HOSPITAL_COMMUNITY): Payer: Medicare Other

## 2017-10-23 LAB — CBC
HEMATOCRIT: 26.1 % — AB (ref 39.0–52.0)
HEMOGLOBIN: 8.2 g/dL — AB (ref 13.0–17.0)
MCH: 27 pg (ref 26.0–34.0)
MCHC: 31.4 g/dL (ref 30.0–36.0)
MCV: 85.9 fL (ref 78.0–100.0)
Platelets: 127 10*3/uL — ABNORMAL LOW (ref 150–400)
RBC: 3.04 MIL/uL — ABNORMAL LOW (ref 4.22–5.81)
RDW: 19.9 % — AB (ref 11.5–15.5)
WBC: 5.8 10*3/uL (ref 4.0–10.5)

## 2017-10-23 LAB — RENAL FUNCTION PANEL
ALBUMIN: 2.4 g/dL — AB (ref 3.5–5.0)
ANION GAP: 15 (ref 5–15)
BUN: 50 mg/dL — AB (ref 6–20)
CALCIUM: 8.7 mg/dL — AB (ref 8.9–10.3)
CO2: 23 mmol/L (ref 22–32)
Chloride: 95 mmol/L — ABNORMAL LOW (ref 101–111)
Creatinine, Ser: 5.74 mg/dL — ABNORMAL HIGH (ref 0.61–1.24)
GFR calc Af Amer: 10 mL/min — ABNORMAL LOW (ref >60)
GFR, EST NON AFRICAN AMERICAN: 9 mL/min — AB (ref >60)
Glucose, Bld: 109 mg/dL — ABNORMAL HIGH (ref 65–99)
POTASSIUM: 4.3 mmol/L (ref 3.5–5.1)
Phosphorus: 5.4 mg/dL — ABNORMAL HIGH (ref 2.5–4.6)
SODIUM: 133 mmol/L — AB (ref 135–145)

## 2017-10-23 MED ORDER — ANTICOAGULANT SODIUM CITRATE 4% (200MG/5ML) IV SOLN: 5.0000 mL | Status: DC

## 2017-10-23 MED ORDER — ANTICOAGULANT SODIUM CITRATE 4% (200MG/5ML) IV SOLN
5.0000 mL | Status: DC | PRN
  Administered 2017-10-23: 5 mL via INTRAVENOUS_CENTRAL
  Filled 2017-10-23: qty 250

## 2017-10-23 MED ORDER — DARBEPOETIN ALFA 200 MCG/0.4ML IJ SOSY
PREFILLED_SYRINGE | INTRAMUSCULAR | Status: AC
  Administered 2017-10-23: 200 ug via INTRAVENOUS
  Filled 2017-10-23: qty 0.4

## 2017-10-23 NOTE — Progress Notes (Signed)
[facility]                                                                              Patient Demographics  Patrick [name], is a 67 y.o. male, DOB - Oct 25, 1950, ZHG:[id number]  Admit date - 10/06/2017   Admitting Physician Chesley Mires, MD  Outpatient Primary MD for the patient is Patient, [name]  Outpatient specialists:   LOS - 17  days   Medical records reviewed and are as summarized below:    Chief Complaint  Patient presents with  . Abnormal Lab       Brief summary   67 yo M SNF resident w/ a Hx CAD, Chronic Systolic CHF (EF 19-62%), remote Colon Cancer w/ perm end colostomy in 2018, CKD Stage III-IV, SBO, and an enlarged prostate who presented to the ED 5/10 after being found w/ altered LOC and several lab abnormalities including cr 13.35 and K 5.66m Hgb 7.5, and hypoglycemia. He was also hypotensive w/ SBP in 70s. He initially underwent CRRT. Now getting intermittent dialysis. 5/10 admit - transfused 1U PRBC - Vascath placed - CRRT initiated 5/13 CRRT stopped  5/11 TTE - diffuse hypokinesis - EF 35-40% - mild mitral regurg 5/16 IHD Patient was transferred to hospitalist service on 10/11/2017  Assessment & Plan    Acute on chronic CKD stage III/IV -Presented with creatinine of 14.4, baseline creatinine 1.6.  Likely due to ATN in the setting of septic shock -Nephrology was consulted and was placed on CRRT. -Currently on intermittent hemodialysis, Right IJ HD catheter placed on 5/22 by IR, underwent hemodialysis on 5/22 and 5/24 -Receiving HD today, still unsure if he will requires permanent dialysis. Not Clip'ed yet  Left wrist pain likely due to severe osteoarthritis -Has swelling in the left hand/wrist, Doppler ultrasound negative for DVT.  X-ray consistent with osteoarthritis.  Uric acid low, ERCP, CRP mildly elevated.  -Continue prednisone 40 mg daily for 3 days, placed wrist splint, K pad.  Per patient feels better today.  Enterobacter  UTI -Finished meropenem on 5/18   Anemia of chronic disease -Likely due to CKD, transfused on 5/16, on Aranesp weekly -Has received 4 units of packed RBCs this admission.  -H&H so far stable, 8.2 today  Thrombocytopenia, acute on chronic, possibly worsened due to sepsis -DIC panel equivocal, unlikely TTP, negative schistocytes. - no heparin products, SRA test still in process, if positive, may need to place on argatroban or Arixtra -Platelets improving 127K today   Acute metabolic encephalopathy likely due to #1 -Resolved, alert and oriented x3  Hypovolemic shock -Likely due to #1, shock physiology resolved  Paroxysmal atrial fibrillation, bradycardia with frequent pauses - Not on anticoagulation due to thrombocytopenia, was on apixaban previously  History of colon CA status post colostomy  Code Status: partial  DVT Prophylaxis:   SCD's Family Communication: Discussed in detail with the patient, all imaging results, lab results explained to the patient    Disposition Plan: Awaiting plans from nephrology regarding permanent dialysis  Time Spent in minutes   15 minutes  Procedures:  5/13 CRRT stopped  5/11 TTE - diffuse hypokinesis - EF 35-40% -  mild mitral regurg 5/16 IHD  Consultants:   PCCM Nephrology   Antimicrobials:      Medications  Scheduled Meds: . Chlorhexidine Gluconate Cloth  6 each Topical Q0600  . darbepoetin (ARANESP) injection - DIALYSIS  200 mcg Intravenous Q Mon-HD  . feeding supplement (NEPRO CARB STEADY)  237 mL Oral TID BM  . pantoprazole  40 mg Oral Daily  . predniSONE  40 mg Oral Q breakfast  . sodium chloride flush  10-40 mL Intracatheter Q12H  . sodium chloride flush  3 mL Intravenous Q12H   Continuous Infusions: . sodium chloride    . anticoagulant sodium citrate     PRN Meds:.sodium chloride, anticoagulant sodium citrate, baclofen, oxyCODONE-acetaminophen, sodium chloride flush, sodium chloride flush   Antibiotics    Anti-infectives (From admission, onward)   Start     Dose/Rate Route Frequency Ordered Stop   10/18/17 1321  ceFAZolin (ANCEF) 2-4 GM/100ML-% IVPB    Note to Pharmacy:  Cecile Sheerer   : cabinet override      10/18/17 1321 10/19/17 0129   10/18/17 0800  ceFAZolin (ANCEF) IVPB 2g/100 mL premix     2 g 200 mL/hr over 30 Minutes Intravenous On call 10/17/17 1154 10/18/17 1400   10/10/17 2200  meropenem (MERREM) 500 mg in sodium chloride 0.9 % 100 mL IVPB     500 mg 200 mL/hr over 30 Minutes Intravenous Every 24 hours 10/10/17 1022 10/14/17 2235   10/08/17 1200  meropenem (MERREM) 1 g in sodium chloride 0.9 % 100 mL IVPB  Status:  Discontinued     1 g 200 mL/hr over 30 Minutes Intravenous Every 12 hours 10/08/17 1050 10/10/17 1022   10/07/17 1600  vancomycin (VANCOCIN) IVPB 1000 mg/200 mL premix  Status:  Discontinued     1,000 mg 200 mL/hr over 60 Minutes Intravenous Every 24 hours 10/06/17 2007 10/08/17 1050   10/07/17 0400  piperacillin-tazobactam (ZOSYN) IVPB 3.375 g  Status:  Discontinued     3.375 g 100 mL/hr over 30 Minutes Intravenous Every 6 hours 10/06/17 2313 10/08/17 1050   10/06/17 2000  piperacillin-tazobactam (ZOSYN) IVPB 3.375 g  Status:  Discontinued     3.375 g 12.5 mL/hr over 240 Minutes Intravenous Every 6 hours 10/06/17 1955 10/06/17 2313   10/06/17 1615  vancomycin (VANCOCIN) 2,000 mg in sodium chloride 0.9 % 500 mL IVPB     2,000 mg 250 mL/hr over 120 Minutes Intravenous  Once 10/06/17 1602 10/06/17 1815   10/06/17 1600  piperacillin-tazobactam (ZOSYN) IVPB 3.375 g     3.375 g 100 mL/hr over 30 Minutes Intravenous  Once 10/06/17 1602 10/06/17 1706   10/06/17 1130  cefTRIAXone (ROCEPHIN) 1 g in sodium chloride 0.9 % 100 mL IVPB  Status:  Discontinued     1 g 200 mL/hr over 30 Minutes Intravenous Every 24 hours 10/06/17 1101 10/06/17 1516   10/06/17 0400  piperacillin-tazobactam (ZOSYN) IVPB 3.375 g  Status:  Discontinued     3.375 g 12.5 mL/hr over 240  Minutes Intravenous Every 12 hours 10/06/17 1602 10/06/17 1955        Subjective:   Patrick [name] was seen and examined today.  Left wrist feels better today, no complaints, seen during HD today.  Tolerating well.  No fevers.  Patient denies dizziness, chest pain or shortness of breath abdominal pain, N/V.   Objective:   Vitals:   10/23/17 1000 10/23/17 1030 10/23/17 1100 10/23/17 1130  BP: 110/66 122/64 91/61 101/77  Pulse: 87 78 79  68  Resp: 18 17    Temp:      TempSrc:      SpO2:   100%   Weight:      Height:        Intake/Output Summary (Last 24 hours) at 10/23/2017 1301 Last data filed at 10/23/2017 1200 Gross per 24 hour  Intake 600 ml  Output 3175 ml  Net -2575 ml     Wt Readings from Last 3 Encounters:  10/23/17 82.9 kg (182 lb 12.2 oz)  08/29/17 97.5 kg (215 lb)  05/26/17 93.7 kg (206 lb 9.6 oz)     Exam    General: Alert and oriented x 3, NAD  Eyes: a,  HEENT:  Atraumatic, normocephalic  Cardiovascular: S1 S2 auscultated, RRR 2+  pedal edema b/l  Respiratory: Clear to auscultation bilaterally, no wheezing, rales or rhonchi  Gastrointestinal: Soft, nontender, nondistended, + bowel sounds  Ext: 2+ pedal edema bilaterally, left wrist in splint  Neuro: no deficit  Musculoskeletal: No digital cyanosis, clubbing  Skin: No rashes  Psych: Normal affect and demeanor, alert and oriented x3    Data Reviewed:  I have personally reviewed following labs and imaging studies  Micro Results No results found for this or any previous visit (from the past 240 hour(s)).  Radiology Reports Dg Wrist Complete Left  Result Date: 10/21/2017 CLINICAL DATA:  Acute left wrist pain without known injury. EXAM: LEFT WRIST - COMPLETE 3+ VIEW COMPARISON:  None. FINDINGS: There is no evidence of fracture or dislocation. Moderate narrowing and sclerosis is seen involving the first carpometacarpal joint. Soft tissues are unremarkable. IMPRESSION: Osteoarthritis of the first  carpometacarpal joint. No acute abnormality seen in the left wrist. Electronically Signed   By: Marijo Conception, M.D.   On: 10/21/2017 09:48   Dg Abd 1 View  Result Date: 10/06/2017 CLINICAL DATA:  Orogastric tube placement. EXAM: ABDOMEN - 1 VIEW COMPARISON:  06/01/2016. FINDINGS: Orogastric tube tip in the proximal stomach and side hole in the proximal to mid stomach. Gaseous distention of the stomach. Again demonstrated are 3 large gallstones in the gallbladder measuring up to 2.6 cm in maximum diameter each. No bowel gas visualized except for the gas in the stomach. Patchy opacity in the left lower lobe. Lumbar spine degenerative changes and minimal scoliosis. IMPRESSION: 1. Gaseous distention of the stomach with no other bowel gas seen. This is concerning for gastric outlet obstruction. 2. Cholelithiasis. 3. Patchy left lower lobe atelectasis or pneumonia. Electronically Signed   By: Claudie Revering M.D.   On: 10/06/2017 19:55   US Renal  Result Date: 10/06/2017 CLINICAL DATA:  67 year old male with acute renal failure. EXAM: RENAL / URINARY TRACT ULTRASOUND COMPLETE COMPARISON:  10/06/2016 ultrasound and 03/06/2017 MR FINDINGS: Right Kidney: Length: 12.1 cm. Diffuse increased echogenicity noted. No solid mass or hydronephrosis noted. Left Kidney: Length: 12.4 cm. Diffuse increased echogenicity noted. No solid mass or hydronephrosis identified. A 1.5 cm cyst is again identified. Bladder: A Foley catheter is present within a collapsed bladder. Incidental note is made of ascites. IMPRESSION: 1. Echogenic kidneys bilaterally compatible with medical renal disease. No evidence of hydronephrosis. 2. Ascites Electronically Signed   By: Margarette Canada M.D.   On: 10/06/2017 16:41   Ir Fluoro Guide Cv Line Right  Result Date: 10/18/2017 INDICATION: End-stage renal disease EXAM: TUNNELED DIALYSIS CATHETER PLACEMENT, ULTRASOUND GUIDANCE FOR VASCULAR ACCESS MEDICATIONS: Ancef 2 g; The antibiotic was administered  within an appropriate time interval prior to skin puncture. ANESTHESIA/SEDATION:  Versed 0.5 mg IV; Fentanyl 25 mcg IV; Moderate Sedation Time:  12 minutes The patient was continuously monitored during the procedure by the interventional radiology nurse under my direct supervision. FLUOROSCOPY TIME:  Fluoroscopy Time:  minutes  seconds ( mGy). COMPLICATIONS: None immediate. PROCEDURE: Informed written consent was obtained from the patient after a thorough discussion of the procedural risks, benefits and alternatives. All questions were addressed. Maximal Sterile Barrier Technique was utilized including caps, mask, sterile gowns, sterile gloves, sterile drape, hand hygiene and skin antiseptic. A timeout was performed prior to the initiation of the procedure. The right neck was prepped with ChloraPrep in a sterile fashion, and a sterile drape was applied covering the operative field. A sterile gown and sterile gloves were used for the procedure. 1% lidocaine into the skin and subcutaneous tissue. The right jugular vein was noted to be patent initially with ultrasound. Under sonographic guidance, a micropuncture needle was inserted into the right IJ vein (Ultrasound and fluoroscopic image documentation was performed). It was removed over an 018 wire which was up-sized to an Amplatz. This was advanced into the IVC. A small incision was made in the right upper chest. The tunneling device was utilized to advance the 23 centimeter tip to cuff catheter from the chest incision and out the neck incision. A peel-away sheath was advanced over the Amplatz wire. The leading edge of the catheter was then advanced through the peel-away sheath. The peel-away sheath was removed. It was flushed and instilled with heparin. The chest incision was closed with a 0 Prolene pursestring stitch. The neck incision was closed with a 4-0 Vicryl subcuticular stitch. IMPRESSION: Successful right IJ vein tunneled dialysis catheter with its tip in  the right atrium. Electronically Signed   By: Marybelle Killings M.D.   On: 10/18/2017 14:53   Ir US Guide Vasc Access Right  Result Date: 10/18/2017 INDICATION: End-stage renal disease EXAM: TUNNELED DIALYSIS CATHETER PLACEMENT, ULTRASOUND GUIDANCE FOR VASCULAR ACCESS MEDICATIONS: Ancef 2 g; The antibiotic was administered within an appropriate time interval prior to skin puncture. ANESTHESIA/SEDATION: Versed 0.5 mg IV; Fentanyl 25 mcg IV; Moderate Sedation Time:  12 minutes The patient was continuously monitored during the procedure by the interventional radiology nurse under my direct supervision. FLUOROSCOPY TIME:  Fluoroscopy Time:  minutes  seconds ( mGy). COMPLICATIONS: None immediate. PROCEDURE: Informed written consent was obtained from the patient after a thorough discussion of the procedural risks, benefits and alternatives. All questions were addressed. Maximal Sterile Barrier Technique was utilized including caps, mask, sterile gowns, sterile gloves, sterile drape, hand hygiene and skin antiseptic. A timeout was performed prior to the initiation of the procedure. The right neck was prepped with ChloraPrep in a sterile fashion, and a sterile drape was applied covering the operative field. A sterile gown and sterile gloves were used for the procedure. 1% lidocaine into the skin and subcutaneous tissue. The right jugular vein was noted to be patent initially with ultrasound. Under sonographic guidance, a micropuncture needle was inserted into the right IJ vein (Ultrasound and fluoroscopic image documentation was performed). It was removed over an 018 wire which was up-sized to an Amplatz. This was advanced into the IVC. A small incision was made in the right upper chest. The tunneling device was utilized to advance the 23 centimeter tip to cuff catheter from the chest incision and out the neck incision. A peel-away sheath was advanced over the Amplatz wire. The leading edge of the catheter was then advanced  through the peel-away  sheath. The peel-away sheath was removed. It was flushed and instilled with heparin. The chest incision was closed with a 0 Prolene pursestring stitch. The neck incision was closed with a 4-0 Vicryl subcuticular stitch. IMPRESSION: Successful right IJ vein tunneled dialysis catheter with its tip in the right atrium. Electronically Signed   By: Marybelle Killings M.D.   On: 10/18/2017 14:53   Dg Chest Port 1 View  Result Date: 10/09/2017 CLINICAL DATA:  Shortness of breath EXAM: PORTABLE CHEST 1 VIEW COMPARISON:  10/08/2017 FINDINGS: Cardiac shadow remains mildly enlarged. Endotracheal tube, nasogastric catheter and left jugular temporary dialysis catheter are again seen and stable. The lungs are hypoinflated. Some persistent left retrocardiac density is noted. No new focal abnormality is noted. IMPRESSION: No significant change from the previous day. Electronically Signed   By: Inez Catalina M.D.   On: 10/09/2017 07:10   Dg Chest Port 1 View  Result Date: 10/08/2017 CLINICAL DATA:  Endotracheal tube placement. EXAM: PORTABLE CHEST 1 VIEW COMPARISON:  Oct 07, 2017 FINDINGS: Mediastinal contour is stable. The heart size is enlarged. Endotracheal tube is identified distal tip 4.9 cm from carina. A left central venous line is identified distal tip in the superior vena cava. A nasogastric tube is noted with distal tip not included on film but is probably in the stomach. Mild patchy opacities identified in the left lung base. There is no pulmonary edema or pleural effusion. The visualized skeletal structures are stable. IMPRESSION: Life supporting devices as described. Endotracheal tube in good position. Cardiomegaly. Patchy opacity of the medial left lung base probably due to atelectasis but superimposed pneumonia is not excluded. Electronically Signed   By: Abelardo Diesel M.D.   On: 10/08/2017 08:03   Dg Chest Port 1 View  Result Date: 10/07/2017 CLINICAL DATA:  Respiratory failure EXAM:  PORTABLE CHEST 1 VIEW COMPARISON:  Oct 06, 2017 FINDINGS: The ETT, left central line, and NG tube are again identified and stable within visualize limits. No pneumothorax. Stable cardiomegaly. The hila and mediastinum are unchanged. Mild opacity in the medial right lung base may represent atelectasis, vascular crowding, or subtle infiltrate. Recommend attention on follow-up. No other acute abnormalities. IMPRESSION: 1. Stable support apparatus as above. 2. Mild increased opacity in the medial right lung base could represent vascular crowding, atelectasis, or subtle infiltrate. Recommend attention on follow-up. Electronically Signed   By: Dorise Bullion III M.D   On: 10/07/2017 07:47   Dg Chest Port 1 View  Result Date: 10/06/2017 CLINICAL DATA:  Intubated. EXAM: PORTABLE CHEST 1 VIEW COMPARISON:  Earlier today. FINDINGS: Endotracheal tube tip 3.6 cm above the carina. Left jugular catheter unchanged. Stable enlarged cardiac silhouette and prominent interstitial markings. Mild increase in patchy opacity in the left lower lobe. Minimal right basilar atelectasis with improvement. No acute bony abnormality. IMPRESSION: 1. Endotracheal tube tip 3.5 cm above the carina. This could be retracted 3.5 cm to place it at the level of the clavicles. 2. Mildly increased patchy atelectasis or pneumonia in the left lower lobe. 3. Minimal right basilar atelectasis with improvement. 4. Stable cardiomegaly and chronic interstitial lung disease. Electronically Signed   By: Claudie Revering M.D.   On: 10/06/2017 19:53   Dg Chest Port 1 View  Result Date: 10/06/2017 CLINICAL DATA:  Encounter for central line placement. EXAM: PORTABLE CHEST 1 VIEW COMPARISON:  Radiograph of same day. FINDINGS: Stable cardiomegaly. Hypoinflation of the lungs is noted with mild bibasilar subsegmental atelectasis. Interval placement of left internal jugular dialysis catheter  with distal tip in expected position of the SVC. No pneumothorax or significant  pleural effusion is noted. Bony thorax is unremarkable. IMPRESSION: Interval placement of left internal jugular dialysis catheter with distal tip in expected position of SVC. No pneumothorax is noted. Hypoinflation of the lungs with mild bibasilar subsegmental atelectasis. Electronically Signed   By: Marijo Conception, M.D.   On: 10/06/2017 15:23   Dg Chest Portable 1 View  Result Date: 10/06/2017 CLINICAL DATA:  67 year old male with a history of hypotension EXAM: PORTABLE CHEST 1 VIEW COMPARISON:  05/23/2017, 10/08/2016 FINDINGS: Cardiomediastinal silhouette unchanged, likely accentuated by apical lordotic positioning. Persisting cardiomegaly. No pneumothorax.  No large pleural effusion.  Low lung volumes. IMPRESSION: Redemonstration of cardiomegaly. The accentuated appearance is favored to be secondary to the apical lordotic positioning, however, new pericardial effusion not excluded. If there is concern for pericardial effusion, recommend correlation with ECHO. No evidence of lobar pneumonia or overt edema. Electronically Signed   By: Corrie Mckusick D.O.   On: 10/06/2017 12:15   Dg Abd Portable 1v  Result Date: 10/08/2017 CLINICAL DATA:  Gastric outlet obstruction EXAM: PORTABLE ABDOMEN - 1 VIEW COMPARISON:  Portable exam 1118 hours compared to 10/06/2017 FINDINGS: Nasogastric tube coiled in stomach, which is decompressed. Air-filled loops of nonspecific small bowel mid abdomen. No bowel wall thickening. Paucity of colonic gas. Multiple calcified gallstones RIGHT upper quadrant. Bones demineralized. IMPRESSION: Cholelithiasis. Decompressed stomach versus prior exam. Nonspecific mildly dilated air-filled loops of small bowel in the mid abdomen with paucity of colonic gas, nonspecific, could reflect ileus or even potentially small-bowel obstruction; further imaging as clinically warranted. Electronically Signed   By: Lavonia Dana M.D.   On: 10/08/2017 11:29    Lab Data:  CBC: Recent Labs  Lab  10/18/17 0513 10/20/17 0253 10/21/17 0312 10/22/17 0208 10/23/17 0829  WBC 4.8 3.2* 3.4* 4.5 5.8  NEUTROABS  --  2.0 1.9 2.9  --   HGB 9.2* 8.9* 9.1* 9.3* 8.2*  HCT 29.6* 28.7* 29.3* 30.3* 26.1*  MCV 88.1 88.0 87.7 87.6 85.9  PLT 66* 86* 106* 118* 426*   Basic Metabolic Panel: Recent Labs  Lab 10/17/17 0310  10/19/17 0300 10/20/17 0253 10/21/17 0312 10/22/17 0208 10/23/17 0240  NA 136   < > 136 136 134* 133* 133*  K 3.9   < > 3.5 3.7 3.7 3.7 4.3  CL 98*   < > 97* 96* 96* 95* 95*  CO2 28   < > 30 29 29 26 23   GLUCOSE 82   < > 88 81 88 99 109*  BUN 18   < > 18 31* 21* 30* 50*  CREATININE 3.42*   < > 3.17* 4.30* 3.23* 4.44* 5.74*  CALCIUM 8.6*   < > 8.4* 8.5* 8.5* 8.6* 8.7*  MG 1.7  --   --   --   --   --   --   PHOS 2.8   < > 3.3 3.9 3.2 4.4 5.4*   < > = values in this interval not displayed.   GFR: Estimated Creatinine Clearance: 12.3 mL/min (A) (by C-G formula based on SCr of 5.74 mg/dL (H)). Liver Function Tests: Recent Labs  Lab 10/19/17 0300 10/20/17 0253 10/21/17 0312 10/22/17 0208 10/23/17 0240  ALBUMIN 2.2* 2.2* 2.3* 2.3* 2.4*   No results for input(s): LIPASE, AMYLASE in the last 168 hours. No results for input(s): AMMONIA in the last 168 hours. Coagulation Profile: Recent Labs  Lab 10/17/17 1345  INR 1.47  Cardiac Enzymes: No results for input(s): CKTOTAL, CKMB, CKMBINDEX, TROPONINI in the last 168 hours. BNP (last 3 results) No results for input(s): PROBNP in the last 8760 hours. HbA1C: No results for input(s): HGBA1C in the last 72 hours. CBG: Recent Labs  Lab 10/16/17 2013 10/16/17 2306  GLUCAP 87 81   Lipid Profile: No results for input(s): CHOL, HDL, LDLCALC, TRIG, CHOLHDL, LDLDIRECT in the last 72 hours. Thyroid Function Tests: No results for input(s): TSH, T4TOTAL, FREET4, T3FREE, THYROIDAB in the last 72 hours. Anemia Panel: No results for input(s): VITAMINB12, FOLATE, FERRITIN, TIBC, IRON, RETICCTPCT in the last 72  hours. Urine analysis:    Component Value Date/Time   COLORURINE RED (A) 10/06/2017 1101   APPEARANCEUR TURBID (A) 10/06/2017 1101   LABSPEC  10/06/2017 1101    TEST NOT REPORTED DUE TO COLOR INTERFERENCE OF URINE PIGMENT   PHURINE  10/06/2017 1101    TEST NOT REPORTED DUE TO COLOR INTERFERENCE OF URINE PIGMENT   GLUCOSEU (A) 10/06/2017 1101    TEST NOT REPORTED DUE TO COLOR INTERFERENCE OF URINE PIGMENT   HGBUR (A) 10/06/2017 1101    TEST NOT REPORTED DUE TO COLOR INTERFERENCE OF URINE PIGMENT   HGBUR negative 12/26/2008 1541   BILIRUBINUR (A) 10/06/2017 1101    TEST NOT REPORTED DUE TO COLOR INTERFERENCE OF URINE PIGMENT   KETONESUR (A) 10/06/2017 1101    TEST NOT REPORTED DUE TO COLOR INTERFERENCE OF URINE PIGMENT   PROTEINUR (A) 10/06/2017 1101    TEST NOT REPORTED DUE TO COLOR INTERFERENCE OF URINE PIGMENT   UROBILINOGEN 1.0 04/03/2015 1725   NITRITE (A) 10/06/2017 1101    TEST NOT REPORTED DUE TO COLOR INTERFERENCE OF URINE PIGMENT   LEUKOCYTESUR (A) 10/06/2017 1101    TEST NOT REPORTED DUE TO COLOR INTERFERENCE OF URINE PIGMENT     Ripudeep Rai M.D. [facility] 10/23/2017, 1:01 PM  Pager: [phone] Between 7am to 7pm - call Pager - [phone]  After 7pm go to [email or web address] - password [email or web address]  Call night coverage person covering after 7pm

## 2017-10-23 NOTE — Procedures (Signed)
Tol HD via [facility] for AKI,  Still c/o DOE Goal 3500cc BP 101/59 2-3+ BLE edema. Hgb 9.3 gm, K 4.3 WIll obtain CXR as I am inclined to not be as aggressive with fluid removal given oligo-anuria and AKI. He has not been CLIPed pending potential recover  Estanislado Emms, MD

## 2017-10-23 NOTE — Progress Notes (Signed)
Offered Kpad multiple times and pt refused.  Assisted pt back to bed.  Will continue to monitor. Saunders Revel T

## 2017-10-23 NOTE — Progress Notes (Signed)
Transferred to HD by bed, stable.

## 2017-10-24 ENCOUNTER — Ambulatory Visit: Payer: Medicare Other | Admitting: Sports Medicine

## 2017-10-24 LAB — RENAL FUNCTION PANEL
Albumin: 2.5 g/dL — ABNORMAL LOW (ref 3.5–5.0)
Anion gap: 12 (ref 5–15)
BUN: 32 mg/dL — AB (ref 6–20)
CHLORIDE: 97 mmol/L — AB (ref 101–111)
CO2: 26 mmol/L (ref 22–32)
Calcium: 8.5 mg/dL — ABNORMAL LOW (ref 8.9–10.3)
Creatinine, Ser: 3.62 mg/dL — ABNORMAL HIGH (ref 0.61–1.24)
GFR calc non Af Amer: 16 mL/min — ABNORMAL LOW (ref >60)
GFR, EST AFRICAN AMERICAN: 18 mL/min — AB (ref >60)
GLUCOSE: 144 mg/dL — AB (ref 65–99)
Phosphorus: 3.3 mg/dL (ref 2.5–4.6)
Potassium: 3.5 mmol/L (ref 3.5–5.1)
Sodium: 135 mmol/L (ref 135–145)

## 2017-10-24 LAB — CBC
HCT: 30 % — ABNORMAL LOW (ref 39.0–52.0)
Hemoglobin: 9.3 g/dL — ABNORMAL LOW (ref 13.0–17.0)
MCH: 26.7 pg (ref 26.0–34.0)
MCHC: 31 g/dL (ref 30.0–36.0)
MCV: 86.2 fL (ref 78.0–100.0)
Platelets: 140 10*3/uL — ABNORMAL LOW (ref 150–400)
RBC: 3.48 MIL/uL — AB (ref 4.22–5.81)
RDW: 20.2 % — ABNORMAL HIGH (ref 11.5–15.5)
WBC: 5.6 10*3/uL (ref 4.0–10.5)

## 2017-10-24 LAB — SEROTONIN RELEASE ASSAY (SRA)
SRA, HIGH DOSE HEPARIN: 14 % (ref 0–20)
SRA, LOW DOSE HEPARIN: 13 % (ref 0–20)

## 2017-10-24 MED ORDER — CHLORHEXIDINE GLUCONATE CLOTH 2 % EX PADS
6.0000 | MEDICATED_PAD | Freq: Every day | CUTANEOUS | Status: DC
  Administered 2017-10-25 – 2017-10-26 (×2): 6 via TOPICAL

## 2017-10-24 MED ORDER — TRAZODONE HCL 50 MG PO TABS
50.0000 mg | ORAL_TABLET | Freq: Every evening | ORAL | Status: DC | PRN
  Administered 2017-10-24 – 2017-10-27 (×3): 50 mg via ORAL
  Filled 2017-10-24 (×3): qty 1

## 2017-10-24 NOTE — Progress Notes (Signed)
Physical Therapy Treatment Patient Details Name: Patrick [name] MRN: [id number] DOB: December 10, 1950 Today's Date: 10/24/2017    History of Present Illness Pt adm with acute metabolic encephalopathy, shock, and acute on chronic kidney failure. Pt intubated 5/10-5/13 and on CRRT 5/10-5/13. PMH - Rectal CA, colostomy, CAD, afib, HTN, heart failure, ckd    PT Comments    Pt pleasant and willing to participate with therapy. Pt demonstrates impaired short term memory as pt states that this is the first time walking since being hospitalized. Pt ambulated with PT last week and with nursing staff since. Pt demonstrates improved activity tolerance as he ambulated 150 feet min guard.    HR rest: 90 bpm HR activity: 115 bpm  Follow Up Recommendations  SNF     Equipment Recommendations  None recommended by PT    Recommendations for Other Services       Precautions / Restrictions Precautions Precautions: Fall Restrictions Weight Bearing Restrictions: No    Mobility  Bed Mobility Overal bed mobility: Modified Independent Bed Mobility: Supine to Sit     Supine to sit: HOB elevated     General bed mobility comments: Pt required increased time with supine>sit with HOB elevated  Transfers Overall transfer level: Needs assistance Equipment used: Rolling walker (2 wheeled) Transfers: Sit to/from Stand Sit to Stand: Supervision         General transfer comment: Supervision for safety; Pt demonstrates good LE strength functionally  Ambulation/Gait Ambulation/Gait assistance: Min guard Ambulation Distance (Feet): 150 Feet Assistive device: Rolling walker (2 wheeled) Gait Pattern/deviations: Step-through pattern;Decreased stride length;Trunk flexed Gait velocity: Decreased   General Gait Details: Pt ambulates slowly min guard with RW with no fatigue or SOB noted. Pt reports initial R ankle pain with gait that improved with ambulation. Pt able to have conversation while walking without  further decrease in gait speed   Stairs             Wheelchair Mobility    Modified Rankin (Stroke Patients Only)       Balance Overall balance assessment: Mild deficits observed, not formally tested                                          Cognition Arousal/Alertness: Awake/alert Behavior During Therapy: WFL for tasks assessed/performed Overall Cognitive Status: No family/caregiver present to determine baseline cognitive functioning Area of Impairment: Memory                     Memory: Decreased short-term memory Following Commands: Follows multi-step commands consistently       General Comments: Pt able to hold conversation and respond to questions appropriately but does not remember participating with therapy last week as he believed this session was the first time he had walked in the hospital      Exercises General Exercises - Lower Extremity Ankle Circles/Pumps: AROM;Both;10 reps;Seated Long Arc Quad: AROM;Both;10 reps;Seated Hip Flexion/Marching: AROM;Both;10 reps;Seated    General Comments        Pertinent Vitals/Pain Pain Score: 2  Pain Location: R ankle Pain Descriptors / Indicators: Aching;Sore;Discomfort Pain Intervention(s): Limited activity within patient's tolerance;Monitored during session    Home Living                      Prior Function            PT Goals (current  goals can now be found in the care plan section) Acute Rehab PT Goals Patient Stated Goal: No goal stated this session Progress towards PT goals: Progressing toward goals    Frequency           PT Plan Current plan remains appropriate    Co-evaluation              AM-PAC PT "6 Clicks" Daily Activity  Outcome Measure  Difficulty turning over in bed (including adjusting bedclothes, sheets and blankets)?: None Difficulty moving from lying on back to sitting on the side of the bed? : None Difficulty sitting down on and  standing up from a chair with arms (e.g., wheelchair, bedside commode, etc,.)?: A Little Help needed moving to and from a bed to chair (including a wheelchair)?: A Little Help needed walking in hospital room?: A Little Help needed climbing 3-5 steps with a railing? : A Little 6 Click Score: 20    End of Session Equipment Utilized During Treatment: Gait belt Activity Tolerance: Patient tolerated treatment well Patient left: in chair;with chair alarm set;with call bell/phone within reach Nurse Communication: Mobility status PT Visit Diagnosis: Other abnormalities of gait and mobility (R26.89);Difficulty in walking, not elsewhere classified (R26.2);Muscle weakness (generalized) (M62.81)     Time: 5830-9407 PT Time Calculation (min) (ACUTE ONLY): 24 min  Charges:  $Gait Training: 8-22 mins $Therapeutic Exercise: 8-22 mins                    G Codes:       Gabe Kimberl Vig, SPT   [employer] 10/24/2017, 10:45 AM

## 2017-10-24 NOTE — Progress Notes (Signed)
Pharmacy Heparin Induced Thrombocytopenia (HIT) Note:  Patrick [name] is a 67 y.o. male being evaluated for HIT. Heparin was started 5/13 for HD, and baseline platelets were 47. HIT labs were ordered on 5/21 when platelets dropped to 58.   Heparin Induced Plt Ab  Date/Time Value Ref Range Status  10/17/2017 01:45 PM 0.465 (H) 0.000 - 0.400 OD Final    Comment:    (NOTE) Performed At: [facility] [place], [place] [id number] Rush Farmer MD JQ:[id number] Performed at [facility], [place] [address], [place], [place] [address]    SRA .2 IU/mL UFH Ser-aCnc  Date/Time Value Ref Range Status  10/18/2017 04:45 PM 13 0 - 20 % Final    Comment:    (NOTE) This test was developed and its performance characteristics determined by [facility]. It has not been cleared or approved by the Food and Drug Administration.    SRA 100IU/mL UFH Ser-aCnc  Date/Time Value Ref Range Status  10/18/2017 04:45 PM 14 0 - 20 % Final    Comment:    (NOTE) This test was developed and its performance characteristics determined by [facility]. It has not been cleared or approved by the Food and Drug Administration.    Confirmation serotonin release assay (SRA) came back negative today despite heparin induced platelet antibody OD of 0.465. Heparin allergy has been removed from profile.   Doylene Canard, PharmD Clinical Pharmacist  Pager: [phone] Phone: [phone]

## 2017-10-24 NOTE — Progress Notes (Signed)
[facility]                                                                              Patient Demographics  Patrick [name], is a 67 y.o. male, DOB - [birth year], HYQ:[id number]  Admit date - 10/06/2017   Admitting Physician Chesley Mires, MD  Outpatient Primary MD for the patient is Patient, [name]  Outpatient specialists:   LOS - 18  days   Medical records reviewed and are as summarized below:    Chief Complaint  Patient presents with  . Abnormal Lab       Brief summary   67 yo M SNF resident w/ a Hx CAD, Chronic Systolic CHF (EF 95-28%), remote Colon Cancer w/ perm end colostomy in 2018, CKD Stage III-IV, SBO, and an enlarged prostate who presented to the ED 5/10 after being found w/ altered LOC and several lab abnormalities including cr 13.35 and K 5.105m Hgb 7.5, and hypoglycemia. He was also hypotensive w/ SBP in 70s. He initially underwent CRRT. Now getting intermittent dialysis. 5/10 admit - transfused 1U PRBC - Vascath placed - CRRT initiated 5/13 CRRT stopped  5/11 TTE - diffuse hypokinesis - EF 35-40% - mild mitral regurg 5/16 IHD Patient was transferred to hospitalist service on 10/11/2017  Assessment & Plan    Acute on chronic CKD stage III/IV -Presented with creatinine of 14.4, baseline creatinine 1.6.  Likely due to ATN in the setting of septic shock -Nephrology was consulted and was placed on CRRT. -Currently on intermittent hemodialysis, Right IJ HD catheter placed on 5/22 by IR, underwent hemodialysis on 5/22, 5/24 and 5/27 -Awaiting nephrology recommendation regarding CLIP   Left wrist pain likely due to severe osteoarthritis -Has swelling in the left hand/wrist, Doppler ultrasound negative for DVT.  X-ray consistent with osteoarthritis.  Uric acid low, ERCP, CRP mildly elevated.  -Placed on prednisone 40 mg daily for 3 days, placed wrist splint, K pad, feels a lot better now.    Enterobacter UTI -Finished meropenem on 5/18   Anemia of  chronic disease -Likely due to CKD, transfused on 5/16, on Aranesp weekly -Has received 4 units of packed RBCs this admission.  -H&H so far stable, 8.2 today  Thrombocytopenia, acute on chronic, possibly worsened due to sepsis -DIC panel equivocal, unlikely TTP, negative schistocytes. -no heparin products, SRA test still in process, if positive, may need to place on argatroban or Arixtra -Platelets continue to improve, 413K   Acute metabolic encephalopathy likely due to #1 -Resolved, alert and oriented x3  Hypovolemic shock -Likely due to #1, shock physiology resolved  Paroxysmal atrial fibrillation, bradycardia with frequent pauses - Not on anticoagulation due to thrombocytopenia, was on apixaban previously  History of colon CA status post colostomy  Code Status: partial  DVT Prophylaxis:   SCD's Family Communication: Discussed in detail with the patient, all imaging results, lab results explained to the patient    Disposition Plan: Awaiting plans from nephrology regarding permanent dialysis  Time Spent in minutes   15 minutes  Procedures:  5/13 CRRT stopped  5/11 TTE - diffuse hypokinesis - EF 35-40% - mild mitral regurg 5/16  IHD  Consultants:   PCCM Nephrology   Antimicrobials:      Medications  Scheduled Meds: . Chlorhexidine Gluconate Cloth  6 each Topical Q0600  . darbepoetin (ARANESP) injection - DIALYSIS  200 mcg Intravenous Q Mon-HD  . feeding supplement (NEPRO CARB STEADY)  237 mL Oral TID BM  . pantoprazole  40 mg Oral Daily  . sodium chloride flush  10-40 mL Intracatheter Q12H   Continuous Infusions: . sodium chloride    . anticoagulant sodium citrate     PRN Meds:.sodium chloride, anticoagulant sodium citrate, baclofen, oxyCODONE-acetaminophen, sodium chloride flush, traZODone   Antibiotics   Anti-infectives (From admission, onward)   Start     Dose/Rate Route Frequency Ordered Stop   10/18/17 1321  ceFAZolin (ANCEF) 2-4 GM/100ML-% IVPB     Note to Pharmacy:  Cecile Sheerer   : cabinet override      10/18/17 1321 10/19/17 0129   10/18/17 0800  ceFAZolin (ANCEF) IVPB 2g/100 mL premix     2 g 200 mL/hr over 30 Minutes Intravenous On call 10/17/17 1154 10/18/17 1400   10/10/17 2200  meropenem (MERREM) 500 mg in sodium chloride 0.9 % 100 mL IVPB     500 mg 200 mL/hr over 30 Minutes Intravenous Every 24 hours 10/10/17 1022 10/14/17 2235   10/08/17 1200  meropenem (MERREM) 1 g in sodium chloride 0.9 % 100 mL IVPB  Status:  Discontinued     1 g 200 mL/hr over 30 Minutes Intravenous Every 12 hours 10/08/17 1050 10/10/17 1022   10/07/17 1600  vancomycin (VANCOCIN) IVPB 1000 mg/200 mL premix  Status:  Discontinued     1,000 mg 200 mL/hr over 60 Minutes Intravenous Every 24 hours 10/06/17 2007 10/08/17 1050   10/07/17 0400  piperacillin-tazobactam (ZOSYN) IVPB 3.375 g  Status:  Discontinued     3.375 g 100 mL/hr over 30 Minutes Intravenous Every 6 hours 10/06/17 2313 10/08/17 1050   10/06/17 2000  piperacillin-tazobactam (ZOSYN) IVPB 3.375 g  Status:  Discontinued     3.375 g 12.5 mL/hr over 240 Minutes Intravenous Every 6 hours 10/06/17 1955 10/06/17 2313   10/06/17 1615  vancomycin (VANCOCIN) 2,000 mg in sodium chloride 0.9 % 500 mL IVPB     2,000 mg 250 mL/hr over 120 Minutes Intravenous  Once 10/06/17 1602 10/06/17 1815   10/06/17 1600  piperacillin-tazobactam (ZOSYN) IVPB 3.375 g     3.375 g 100 mL/hr over 30 Minutes Intravenous  Once 10/06/17 1602 10/06/17 1706   10/06/17 1130  cefTRIAXone (ROCEPHIN) 1 g in sodium chloride 0.9 % 100 mL IVPB  Status:  Discontinued     1 g 200 mL/hr over 30 Minutes Intravenous Every 24 hours 10/06/17 1101 10/06/17 1516   10/06/17 0400  piperacillin-tazobactam (ZOSYN) IVPB 3.375 g  Status:  Discontinued     3.375 g 12.5 mL/hr over 240 Minutes Intravenous Every 12 hours 10/06/17 1602 10/06/17 1955        Subjective:   Dayan Desa was seen and examined today.  No complaints, left wrist  feels a lot better now.  Has been tolerating hemodialysis.  Still has significant peripheral edema.  No fevers. Patient denies dizziness, chest pain or shortness of breath abdominal pain, N/V.   Objective:   Vitals:   10/23/17 1913 10/24/17 0043 10/24/17 0329 10/24/17 0731  BP: 131/84 134/72 135/77 109/77  Pulse: 86 79 80 80  Resp: 18 (!) 21 13 15   Temp: 97.8 F (36.6 C) 98.1 F (36.7 C) 97.8 F (  36.6 C) 98.4 F (36.9 C)  TempSrc: Oral Oral Oral Oral  SpO2: 97% 100% 100% 100%  Weight:   80.3 kg (177 lb)   Height:        Intake/Output Summary (Last 24 hours) at 10/24/2017 1030 Last data filed at 10/24/2017 0330 Gross per 24 hour  Intake 480 ml  Output 3400 ml  Net -2920 ml     Wt Readings from Last 3 Encounters:  10/24/17 80.3 kg (177 lb)  08/29/17 97.5 kg (215 lb)  05/26/17 93.7 kg (206 lb 9.6 oz)     Exam   General: Alert and oriented x 3, NAD  Eyes:   HEENT:  Atraumatic, normocephalic  Cardiovascular: S1 S2 auscultated, Regular rate and rhythm.  2+ pedal edema b/l  Respiratory: Clear to auscultation bilaterally, no wheezing, rales or rhonchi  Gastrointestinal: Soft, nontender, nondistended, + bowel sounds  Ext: 2+ pedal edema bilaterally, left wrist in splint  Neuro: no new deficits  Musculoskeletal: No digital cyanosis, clubbing  Skin: No rashes  Psych: Normal affect and demeanor, alert and oriented x3    Data Reviewed:  I have personally reviewed following labs and imaging studies  Micro Results No results found for this or any previous visit (from the past 240 hour(s)).  Radiology Reports Dg Wrist Complete Left  Result Date: 10/21/2017 CLINICAL DATA:  Acute left wrist pain without known injury. EXAM: LEFT WRIST - COMPLETE 3+ VIEW COMPARISON:  None. FINDINGS: There is no evidence of fracture or dislocation. Moderate narrowing and sclerosis is seen involving the first carpometacarpal joint. Soft tissues are unremarkable. IMPRESSION:  Osteoarthritis of the first carpometacarpal joint. No acute abnormality seen in the left wrist. Electronically Signed   By: Marijo Conception, M.D.   On: 10/21/2017 09:48   Dg Abd 1 View  Result Date: 10/06/2017 CLINICAL DATA:  Orogastric tube placement. EXAM: ABDOMEN - 1 VIEW COMPARISON:  06/01/2016. FINDINGS: Orogastric tube tip in the proximal stomach and side hole in the proximal to mid stomach. Gaseous distention of the stomach. Again demonstrated are 3 large gallstones in the gallbladder measuring up to 2.6 cm in maximum diameter each. No bowel gas visualized except for the gas in the stomach. Patchy opacity in the left lower lobe. Lumbar spine degenerative changes and minimal scoliosis. IMPRESSION: 1. Gaseous distention of the stomach with no other bowel gas seen. This is concerning for gastric outlet obstruction. 2. Cholelithiasis. 3. Patchy left lower lobe atelectasis or pneumonia. Electronically Signed   By: Claudie Revering M.D.   On: 10/06/2017 19:55   US Renal  Result Date: 10/06/2017 CLINICAL DATA:  67 year old male with acute renal failure. EXAM: RENAL / URINARY TRACT ULTRASOUND COMPLETE COMPARISON:  10/06/2016 ultrasound and 03/06/2017 MR FINDINGS: Right Kidney: Length: 12.1 cm. Diffuse increased echogenicity noted. No solid mass or hydronephrosis noted. Left Kidney: Length: 12.4 cm. Diffuse increased echogenicity noted. No solid mass or hydronephrosis identified. A 1.5 cm cyst is again identified. Bladder: A Foley catheter is present within a collapsed bladder. Incidental note is made of ascites. IMPRESSION: 1. Echogenic kidneys bilaterally compatible with medical renal disease. No evidence of hydronephrosis. 2. Ascites Electronically Signed   By: Margarette Canada M.D.   On: 10/06/2017 16:41   Ir Fluoro Guide Cv Line Right  Result Date: 10/18/2017 INDICATION: End-stage renal disease EXAM: TUNNELED DIALYSIS CATHETER PLACEMENT, ULTRASOUND GUIDANCE FOR VASCULAR ACCESS MEDICATIONS: Ancef 2 g; The  antibiotic was administered within an appropriate time interval prior to skin puncture. ANESTHESIA/SEDATION: Versed 0.5 mg  IV; Fentanyl 25 mcg IV; Moderate Sedation Time:  12 minutes The patient was continuously monitored during the procedure by the interventional radiology nurse under my direct supervision. FLUOROSCOPY TIME:  Fluoroscopy Time:  minutes  seconds ( mGy). COMPLICATIONS: None immediate. PROCEDURE: Informed written consent was obtained from the patient after a thorough discussion of the procedural risks, benefits and alternatives. All questions were addressed. Maximal Sterile Barrier Technique was utilized including caps, mask, sterile gowns, sterile gloves, sterile drape, hand hygiene and skin antiseptic. A timeout was performed prior to the initiation of the procedure. The right neck was prepped with ChloraPrep in a sterile fashion, and a sterile drape was applied covering the operative field. A sterile gown and sterile gloves were used for the procedure. 1% lidocaine into the skin and subcutaneous tissue. The right jugular vein was noted to be patent initially with ultrasound. Under sonographic guidance, a micropuncture needle was inserted into the right IJ vein (Ultrasound and fluoroscopic image documentation was performed). It was removed over an 018 wire which was up-sized to an Amplatz. This was advanced into the IVC. A small incision was made in the right upper chest. The tunneling device was utilized to advance the 23 centimeter tip to cuff catheter from the chest incision and out the neck incision. A peel-away sheath was advanced over the Amplatz wire. The leading edge of the catheter was then advanced through the peel-away sheath. The peel-away sheath was removed. It was flushed and instilled with heparin. The chest incision was closed with a 0 Prolene pursestring stitch. The neck incision was closed with a 4-0 Vicryl subcuticular stitch. IMPRESSION: Successful right IJ vein tunneled dialysis  catheter with its tip in the right atrium. Electronically Signed   By: Marybelle Killings M.D.   On: 10/18/2017 14:53   Ir US Guide Vasc Access Right  Result Date: 10/18/2017 INDICATION: End-stage renal disease EXAM: TUNNELED DIALYSIS CATHETER PLACEMENT, ULTRASOUND GUIDANCE FOR VASCULAR ACCESS MEDICATIONS: Ancef 2 g; The antibiotic was administered within an appropriate time interval prior to skin puncture. ANESTHESIA/SEDATION: Versed 0.5 mg IV; Fentanyl 25 mcg IV; Moderate Sedation Time:  12 minutes The patient was continuously monitored during the procedure by the interventional radiology nurse under my direct supervision. FLUOROSCOPY TIME:  Fluoroscopy Time:  minutes  seconds ( mGy). COMPLICATIONS: None immediate. PROCEDURE: Informed written consent was obtained from the patient after a thorough discussion of the procedural risks, benefits and alternatives. All questions were addressed. Maximal Sterile Barrier Technique was utilized including caps, mask, sterile gowns, sterile gloves, sterile drape, hand hygiene and skin antiseptic. A timeout was performed prior to the initiation of the procedure. The right neck was prepped with ChloraPrep in a sterile fashion, and a sterile drape was applied covering the operative field. A sterile gown and sterile gloves were used for the procedure. 1% lidocaine into the skin and subcutaneous tissue. The right jugular vein was noted to be patent initially with ultrasound. Under sonographic guidance, a micropuncture needle was inserted into the right IJ vein (Ultrasound and fluoroscopic image documentation was performed). It was removed over an 018 wire which was up-sized to an Amplatz. This was advanced into the IVC. A small incision was made in the right upper chest. The tunneling device was utilized to advance the 23 centimeter tip to cuff catheter from the chest incision and out the neck incision. A peel-away sheath was advanced over the Amplatz wire. The leading edge of the  catheter was then advanced through the peel-away sheath. The peel-away  sheath was removed. It was flushed and instilled with heparin. The chest incision was closed with a 0 Prolene pursestring stitch. The neck incision was closed with a 4-0 Vicryl subcuticular stitch. IMPRESSION: Successful right IJ vein tunneled dialysis catheter with its tip in the right atrium. Electronically Signed   By: Marybelle Killings M.D.   On: 10/18/2017 14:53   Dg Chest Port 1 View  Result Date: 10/23/2017 CLINICAL DATA:  CHF.  End-stage renal disease.  Dialysis patient. EXAM: PORTABLE CHEST 1 VIEW COMPARISON:  10/09/2017 and older exams. FINDINGS: Cardiac silhouette is normal in size. No mediastinal or hilar masses. Right internal jugular dual-lumen tunneled central venous catheter has its tip projecting at the caval atrial junction. Lungs are clear.  No convincing pleural effusion.  No pneumothorax. Skeletal structures are grossly intact. IMPRESSION: No acute cardiopulmonary disease. Electronically Signed   By: Lajean Manes M.D.   On: 10/23/2017 13:39   Dg Chest Port 1 View  Result Date: 10/09/2017 CLINICAL DATA:  Shortness of breath EXAM: PORTABLE CHEST 1 VIEW COMPARISON:  10/08/2017 FINDINGS: Cardiac shadow remains mildly enlarged. Endotracheal tube, nasogastric catheter and left jugular temporary dialysis catheter are again seen and stable. The lungs are hypoinflated. Some persistent left retrocardiac density is noted. No new focal abnormality is noted. IMPRESSION: No significant change from the previous day. Electronically Signed   By: Inez Catalina M.D.   On: 10/09/2017 07:10   Dg Chest Port 1 View  Result Date: 10/08/2017 CLINICAL DATA:  Endotracheal tube placement. EXAM: PORTABLE CHEST 1 VIEW COMPARISON:  Oct 07, 2017 FINDINGS: Mediastinal contour is stable. The heart size is enlarged. Endotracheal tube is identified distal tip 4.9 cm from carina. A left central venous line is identified distal tip in the superior vena  cava. A nasogastric tube is noted with distal tip not included on film but is probably in the stomach. Mild patchy opacities identified in the left lung base. There is no pulmonary edema or pleural effusion. The visualized skeletal structures are stable. IMPRESSION: Life supporting devices as described. Endotracheal tube in good position. Cardiomegaly. Patchy opacity of the medial left lung base probably due to atelectasis but superimposed pneumonia is not excluded. Electronically Signed   By: Abelardo Diesel M.D.   On: 10/08/2017 08:03   Dg Chest Port 1 View  Result Date: 10/07/2017 CLINICAL DATA:  Respiratory failure EXAM: PORTABLE CHEST 1 VIEW COMPARISON:  Oct 06, 2017 FINDINGS: The ETT, left central line, and NG tube are again identified and stable within visualize limits. No pneumothorax. Stable cardiomegaly. The hila and mediastinum are unchanged. Mild opacity in the medial right lung base may represent atelectasis, vascular crowding, or subtle infiltrate. Recommend attention on follow-up. No other acute abnormalities. IMPRESSION: 1. Stable support apparatus as above. 2. Mild increased opacity in the medial right lung base could represent vascular crowding, atelectasis, or subtle infiltrate. Recommend attention on follow-up. Electronically Signed   By: Dorise Bullion III M.D   On: 10/07/2017 07:47   Dg Chest Port 1 View  Result Date: 10/06/2017 CLINICAL DATA:  Intubated. EXAM: PORTABLE CHEST 1 VIEW COMPARISON:  Earlier today. FINDINGS: Endotracheal tube tip 3.6 cm above the carina. Left jugular catheter unchanged. Stable enlarged cardiac silhouette and prominent interstitial markings. Mild increase in patchy opacity in the left lower lobe. Minimal right basilar atelectasis with improvement. No acute bony abnormality. IMPRESSION: 1. Endotracheal tube tip 3.5 cm above the carina. This could be retracted 3.5 cm to place it at the level of the  clavicles. 2. Mildly increased patchy atelectasis or pneumonia  in the left lower lobe. 3. Minimal right basilar atelectasis with improvement. 4. Stable cardiomegaly and chronic interstitial lung disease. Electronically Signed   By: Claudie Revering M.D.   On: 10/06/2017 19:53   Dg Chest Port 1 View  Result Date: 10/06/2017 CLINICAL DATA:  Encounter for central line placement. EXAM: PORTABLE CHEST 1 VIEW COMPARISON:  Radiograph of same day. FINDINGS: Stable cardiomegaly. Hypoinflation of the lungs is noted with mild bibasilar subsegmental atelectasis. Interval placement of left internal jugular dialysis catheter with distal tip in expected position of the SVC. No pneumothorax or significant pleural effusion is noted. Bony thorax is unremarkable. IMPRESSION: Interval placement of left internal jugular dialysis catheter with distal tip in expected position of SVC. No pneumothorax is noted. Hypoinflation of the lungs with mild bibasilar subsegmental atelectasis. Electronically Signed   By: Marijo Conception, M.D.   On: 10/06/2017 15:23   Dg Chest Portable 1 View  Result Date: 10/06/2017 CLINICAL DATA:  66 year old male with a history of hypotension EXAM: PORTABLE CHEST 1 VIEW COMPARISON:  05/23/2017, 10/08/2016 FINDINGS: Cardiomediastinal silhouette unchanged, likely accentuated by apical lordotic positioning. Persisting cardiomegaly. No pneumothorax.  No large pleural effusion.  Low lung volumes. IMPRESSION: Redemonstration of cardiomegaly. The accentuated appearance is favored to be secondary to the apical lordotic positioning, however, new pericardial effusion not excluded. If there is concern for pericardial effusion, recommend correlation with ECHO. No evidence of lobar pneumonia or overt edema. Electronically Signed   By: Corrie Mckusick D.O.   On: 10/06/2017 12:15   Dg Abd Portable 1v  Result Date: 10/08/2017 CLINICAL DATA:  Gastric outlet obstruction EXAM: PORTABLE ABDOMEN - 1 VIEW COMPARISON:  Portable exam 1118 hours compared to 10/06/2017 FINDINGS: Nasogastric tube  coiled in stomach, which is decompressed. Air-filled loops of nonspecific small bowel mid abdomen. No bowel wall thickening. Paucity of colonic gas. Multiple calcified gallstones RIGHT upper quadrant. Bones demineralized. IMPRESSION: Cholelithiasis. Decompressed stomach versus prior exam. Nonspecific mildly dilated air-filled loops of small bowel in the mid abdomen with paucity of colonic gas, nonspecific, could reflect ileus or even potentially small-bowel obstruction; further imaging as clinically warranted. Electronically Signed   By: Lavonia Dana M.D.   On: 10/08/2017 11:29    Lab Data:  CBC: Recent Labs  Lab 10/20/17 0253 10/21/17 0312 10/22/17 0208 10/23/17 0829 10/24/17 0340  WBC 3.2* 3.4* 4.5 5.8 5.6  NEUTROABS 2.0 1.9 2.9  --   --   HGB 8.9* 9.1* 9.3* 8.2* 9.3*  HCT 28.7* 29.3* 30.3* 26.1* 30.0*  MCV 88.0 87.7 87.6 85.9 86.2  PLT 86* 106* 118* 127* 191*   Basic Metabolic Panel: Recent Labs  Lab 10/20/17 0253 10/21/17 0312 10/22/17 0208 10/23/17 0240 10/24/17 0340  NA 136 134* 133* 133* 135  K 3.7 3.7 3.7 4.3 3.5  CL 96* 96* 95* 95* 97*  CO2 29 29 26 23 26   GLUCOSE 81 88 99 109* 144*  BUN 31* 21* 30* 50* 32*  CREATININE 4.30* 3.23* 4.44* 5.74* 3.62*  CALCIUM 8.5* 8.5* 8.6* 8.7* 8.5*  PHOS 3.9 3.2 4.4 5.4* 3.3   GFR: Estimated Creatinine Clearance: 19.2 mL/min (A) (by C-G formula based on SCr of 3.62 mg/dL (H)). Liver Function Tests: Recent Labs  Lab 10/20/17 0253 10/21/17 0312 10/22/17 0208 10/23/17 0240 10/24/17 0340  ALBUMIN 2.2* 2.3* 2.3* 2.4* 2.5*   No results for input(s): LIPASE, AMYLASE in the last 168 hours. No results for input(s): AMMONIA in the last  168 hours. Coagulation Profile: Recent Labs  Lab 10/17/17 1345  INR 1.47   Cardiac Enzymes: No results for input(s): CKTOTAL, CKMB, CKMBINDEX, TROPONINI in the last 168 hours. BNP (last 3 results) No results for input(s): PROBNP in the last 8760 hours. HbA1C: No results for input(s): HGBA1C  in the last 72 hours. CBG: No results for input(s): GLUCAP in the last 168 hours. Lipid Profile: No results for input(s): CHOL, HDL, LDLCALC, TRIG, CHOLHDL, LDLDIRECT in the last 72 hours. Thyroid Function Tests: No results for input(s): TSH, T4TOTAL, FREET4, T3FREE, THYROIDAB in the last 72 hours. Anemia Panel: No results for input(s): VITAMINB12, FOLATE, FERRITIN, TIBC, IRON, RETICCTPCT in the last 72 hours. Urine analysis:    Component Value Date/Time   COLORURINE RED (A) 10/06/2017 1101   APPEARANCEUR TURBID (A) 10/06/2017 1101   LABSPEC  10/06/2017 1101    TEST NOT REPORTED DUE TO COLOR INTERFERENCE OF URINE PIGMENT   PHURINE  10/06/2017 1101    TEST NOT REPORTED DUE TO COLOR INTERFERENCE OF URINE PIGMENT   GLUCOSEU (A) 10/06/2017 1101    TEST NOT REPORTED DUE TO COLOR INTERFERENCE OF URINE PIGMENT   HGBUR (A) 10/06/2017 1101    TEST NOT REPORTED DUE TO COLOR INTERFERENCE OF URINE PIGMENT   HGBUR negative 12/26/2008 1541   BILIRUBINUR (A) 10/06/2017 1101    TEST NOT REPORTED DUE TO COLOR INTERFERENCE OF URINE PIGMENT   KETONESUR (A) 10/06/2017 1101    TEST NOT REPORTED DUE TO COLOR INTERFERENCE OF URINE PIGMENT   PROTEINUR (A) 10/06/2017 1101    TEST NOT REPORTED DUE TO COLOR INTERFERENCE OF URINE PIGMENT   UROBILINOGEN 1.0 04/03/2015 1725   NITRITE (A) 10/06/2017 1101    TEST NOT REPORTED DUE TO COLOR INTERFERENCE OF URINE PIGMENT   LEUKOCYTESUR (A) 10/06/2017 1101    TEST NOT REPORTED DUE TO COLOR INTERFERENCE OF URINE PIGMENT     Kesha Hurrell M.D. [facility] 10/24/2017, 10:30 AM  Pager: [phone] Between 7am to 7pm - call Pager - [phone]  After 7pm go to [email or web address] - password [email or web address]  Call night coverage person covering after 7pm

## 2017-10-24 NOTE — Progress Notes (Signed)
[facility] Rounding Note  Background 67 y.o. yo male NHP, wheelchair bound, PMH CAD, HTN, HLD, BPH, CM with EF 35%, RV and LV dysfunction,  baseline CKD3 (creatinine 1.48 - 1.78 and on losartan outpt). Admitted 10/06/2017 with creatinine 14, AMS -> urosepsis and AKI (ATN felt 2/2 septic shock). CRRT 5/10-5/13 ([place] cath 5/10) then transitioned to IHD 5/16, dialysis dependent since then. Renal US 5/10 12.1, 12.4 echodense. Of note had chronic indwelling foley since May 2018 due to inability to empty bladder, removed this admission, doing daily bladder scans to monitor for UOP. Possible HIT + (confirmatory test pending)  Assessment/Plan:  AKI on CKD3 - 2/2 ATN/septic shock from enterobacter urosepsis. CRRT 5/10-5/13, IHD 5/16, 5/18, 5/20, 5/22, 5/24 No evidence return of renal function yet.  Currently dialysis dependent & will decide on CLIP soon 1. We have not made plans for HD on the outside for him as still hoping for some recovery, but with persistent anuria looking less optimistic  2. Indwelling foley since May 2018 (removed this admission)  for inability to pass urine on his own (sounds like BPH). Doing daily bladder scans now as this inability to void issue could mask renal recovery. (but no sig urine yet) 3. HD again on Wednesday planned  2. Urosepsis- enterobacter- meropenem finished 5/18.  3. Anemia- on ESA- no iron needed.  Transfused 5/16. Gets Aranesp 200 QMonday. Missed dose on 5/20 Reordered to get with Monday HD  4. Thrombocytopenia - acute on chronic. Had been attributed to sepsis but persistently low. Had been getting tight heparin with HD. 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 In the meantime no heparin, citrate block for TDC. 5. PAF/bradycardia - no anticoag at present 2/2 low plts   Subjective: Interval History: No meaningful UOP  Objective: Vital signs in last 24 hours: Temp:  [97.8 F (36.6 C)-98.5 F (36.9 C)] 98.5 F (36.9 C) (05/28 1206) Pulse Rate:   [76-115] 78 (05/28 1206) Resp:  [13-21] 15 (05/28 1206) BP: (109-135)/(58-84) 135/76 (05/28 1206) SpO2:  [97 %-100 %] 99 % (05/28 1206) Weight:  [80.3 kg (177 lb)] 80.3 kg (177 lb) (05/28 0329) Weight change: -0.063 kg (-2.2 oz)  Intake/Output from previous day: 05/27 0701 - 05/28 0700 In: 480 [P.O.:480] Out: 3400 [Stool:400] Intake/Output this shift: Total I/O In: 240 [P.O.:240] Out: 250 [Stool:250]  General appearance: alert and cooperative Resp: clear to auscultation bilaterally Chest wall: no tenderness Cardio: regular rate and rhythm, S1, S2 normal, no murmur, click, rub or gallop Extremities: edema 1+ L>R  Lab Results: Recent Labs    10/23/17 0829 10/24/17 0340  WBC 5.8 5.6  HGB 8.2* 9.3*  HCT 26.1* 30.0*  PLT 127* 140*   BMET:  Recent Labs    10/23/17 0240 10/24/17 0340  NA 133* 135  K 4.3 3.5  CL 95* 97*  CO2 23 26  GLUCOSE 109* 144*  BUN 50* 32*  CREATININE 5.74* 3.62*  CALCIUM 8.7* 8.5*   No results for input(s): PTH in the last 72 hours. Iron Studies: No results for input(s): IRON, TIBC, TRANSFERRIN, FERRITIN in the last 72 hours. Studies/Results: Dg Chest Port 1 View  Result Date: 10/23/2017 CLINICAL DATA:  CHF.  End-stage renal disease.  Dialysis patient. EXAM: PORTABLE CHEST 1 VIEW COMPARISON:  10/09/2017 and older exams. FINDINGS: Cardiac silhouette is normal in size. No mediastinal or hilar masses. Right internal jugular dual-lumen tunneled central venous catheter has its tip projecting at the caval atrial junction. Lungs are clear.  No convincing pleural effusion.  No pneumothorax. Skeletal structures are grossly intact. IMPRESSION: No acute cardiopulmonary disease. Electronically Signed   By: Lajean Manes M.D.   On: 10/23/2017 13:39    Scheduled: . Chlorhexidine Gluconate Cloth  6 each Topical Q0600  . darbepoetin (ARANESP) injection - DIALYSIS  200 mcg Intravenous Q Mon-HD  . feeding supplement (NEPRO CARB STEADY)  237 mL Oral TID BM  .  pantoprazole  40 mg Oral Daily  . sodium chloride flush  10-40 mL Intracatheter Q12H      LOS: 18 days   Estanislado Emms 10/24/2017,2:14 PM

## 2017-10-24 NOTE — Plan of Care (Signed)
Problem: Health Behavior/Discharge Planning: Goal: Ability to manage health-related needs will improve Outcome: Progressing   Problem: Clinical Measurements: Goal: Ability to maintain clinical measurements within normal limits will improve Outcome: Progressing Goal: Will remain free from infection Outcome: Progressing Goal: Diagnostic test results will improve Outcome: Progressing Goal: Respiratory complications will improve Outcome: Progressing Goal: Cardiovascular complication will be avoided Outcome: Progressing   Problem: Activity: Goal: Risk for activity intolerance will decrease Outcome: Progressing   Problem: Nutrition: Goal: Adequate nutrition will be maintained Outcome: Progressing   Problem: Safety: Goal: Ability to remain free from injury will improve Outcome: Progressing   Problem: Skin Integrity: Goal: Risk for impaired skin integrity will decrease Outcome: Progressing

## 2017-10-25 ENCOUNTER — Telehealth: Payer: Self-pay | Admitting: Cardiology

## 2017-10-25 DIAGNOSIS — N189 Chronic kidney disease, unspecified: Secondary | ICD-10-CM

## 2017-10-25 DIAGNOSIS — Z862 Personal history of diseases of the blood and blood-forming organs and certain disorders involving the immune mechanism: Secondary | ICD-10-CM

## 2017-10-25 LAB — RENAL FUNCTION PANEL
Albumin: 2.4 g/dL — ABNORMAL LOW (ref 3.5–5.0)
Anion gap: 13 (ref 5–15)
BUN: 54 mg/dL — AB (ref 6–20)
CHLORIDE: 96 mmol/L — AB (ref 101–111)
CO2: 27 mmol/L (ref 22–32)
CREATININE: 4.95 mg/dL — AB (ref 0.61–1.24)
Calcium: 9 mg/dL (ref 8.9–10.3)
GFR calc Af Amer: 13 mL/min — ABNORMAL LOW (ref >60)
GFR, EST NON AFRICAN AMERICAN: 11 mL/min — AB (ref >60)
GLUCOSE: 108 mg/dL — AB (ref 65–99)
Phosphorus: 4 mg/dL (ref 2.5–4.6)
Potassium: 3.3 mmol/L — ABNORMAL LOW (ref 3.5–5.1)
Sodium: 136 mmol/L (ref 135–145)

## 2017-10-25 LAB — CBC
HCT: 29.9 % — ABNORMAL LOW (ref 39.0–52.0)
Hemoglobin: 9.3 g/dL — ABNORMAL LOW (ref 13.0–17.0)
MCH: 27 pg (ref 26.0–34.0)
MCHC: 31.1 g/dL (ref 30.0–36.0)
MCV: 86.7 fL (ref 78.0–100.0)
PLATELETS: 155 10*3/uL (ref 150–400)
RBC: 3.45 MIL/uL — ABNORMAL LOW (ref 4.22–5.81)
RDW: 20 % — AB (ref 11.5–15.5)
WBC: 6 10*3/uL (ref 4.0–10.5)

## 2017-10-25 MED ORDER — SODIUM CHLORIDE 0.9 % IV SOLN: 100.0000 mL | INTRAVENOUS | Status: DC | PRN

## 2017-10-25 MED ORDER — ALTEPLASE 2 MG IJ SOLR: 2.0000 mg | Freq: Once | INTRAMUSCULAR | Status: DC | PRN

## 2017-10-25 MED ORDER — HEPARIN SODIUM (PORCINE) 1000 UNIT/ML DIALYSIS: 1000.0000 [IU] | INTRAMUSCULAR | Status: DC | PRN

## 2017-10-25 NOTE — Progress Notes (Signed)
Nutrition Follow-up  DOCUMENTATION CODES:   Not applicable  INTERVENTION:   -Continue Nepro Shake po BID, each supplement provides 425 kcal and 19 grams protein  NUTRITION DIAGNOSIS:   Increased nutrient needs related to chronic illness as evidenced by estimated needs.  Ongoing  GOAL:   Patient will meet greater than or equal to 90% of their needs  Progressing  MONITOR:   PO intake, Supplement acceptance, Labs, Weight trends, Skin, I & O's  REASON FOR ASSESSMENT:   Ventilator    ASSESSMENT:   67 y/o male PMHx CAD, Afib, HF (EF 30-35%), remote colon cancer s/p colostomy, CKD3-4, WC bound, HTN/HLD. ALF resident. Presents after being found w/ AMS and abnormal renal labs. Worked up for AKI, acidosis, anemia. Intubated for airway protection. Started on CRRT.   5/13- extubated, CRRT d/c 5/16- HD initiated 5/22- s/p tunneled catheter placement  Reviewed I/O's: +495 ml x 24 hours and -10.2 L since 10/11/17. Wt has been rangining between 176-182# over the past 5 days.   Pt out of room at time of visit. Per RN notes, pt with very good appetite. Meal completion 100%. Pt is also consuming Nepro supplements ordered.   Labs reviewed: K: 3.3.   Diet Order:   Diet Order           Diet renal with fluid restriction Fluid restriction: 1200 mL Fluid; Room service appropriate? Yes; Fluid consistency: Thin  Diet effective now          EDUCATION NEEDS:   No education needs have been identified at this time  Skin:  Skin Assessment: Reviewed RN Assessment  Last BM:  10/25/17 (150 ml output via colostomy)  Height:   Ht Readings from Last 1 Encounters:  10/07/17 5\' 6"  (1.676 m)    Weight:   Wt Readings from Last 1 Encounters:  10/25/17 177 lb 0.5 oz (80.3 kg)    Ideal Body Weight:  70 kg  BMI:  Body mass index is 28.57 kg/m.  Estimated Nutritional Needs:   Kcal:  1900-2100  Protein:  100-115 grams  Fluid:  per MD    Teia Freitas A. Jimmye Norman, RD, LDN, CDE Pager:  [phone] After hours Pager: [phone]

## 2017-10-25 NOTE — Telephone Encounter (Signed)
New Message    Patients sister is calling to inform Dr. Percival Spanish that the patient is in the hospital. He has been there for about 3 weeks with chronic renal failure. He just wanted the provider to be aware.

## 2017-10-25 NOTE — Plan of Care (Signed)
Pt OOB for each meal. Intake 100% with 2 cans Nepro daily.  Pt on room air with no c/o breathlessness

## 2017-10-25 NOTE — Progress Notes (Signed)
[facility] Rounding Note  Background 67 y.o. yo male NHP, wheelchair bound, PMH CAD, HTN, HLD, BPH, CM with EF 35%, RV and LV dysfunction, baseline CKD3 (creatinine 1.48 - 1.78 and on losartan outpt).Admitted 10/06/2017 with creatinine 14, AMS ->urosepsis and AKI (ATN felt 2/2 septic shock). CRRT 5/10-5/13 ([place] cath 5/10) then transitioned to IHD 5/16, dialysis dependent since then.Renal US 5/10 12.1, 12.4 echodense. Of note had chronic indwelling foley since May 2018 due to inability to empty bladder, removed this admission, doing daily bladder scans to monitor for UOP.Possible HIT + (confirmatory test pending)  Assessment/Plan:  AKI on CKD3 - 2/2 ATN/septic shock from enterobacter urosepsis. CRRT 5/10-5/13, IHD 5/16, 5/18, 5/20, 5/22, 5/24 No evidence return of renal function yet. Currently dialysis dependent & will CLIP for OP HD for AKI 1. Indwelling foley since May 2018 (removed this admission) for inability to pass urine on his own (sounds like BPH). Doing daily bladder scans now as this inability to void issue could mask renal recovery. (but no sigurine yet) 2. HDtoday planned  2. Urosepsis- enterobacter- meropenem finished 5/18.  3. Anemia- on ESA- no iron needed. Transfused 5/16. Gets Aranesp 200 QMonday. Missed dose on 5/20 Reordered to get with Monday HD 4. Thrombocytopenia - acute on chronic. 5. PAF/bradycardia - no anticoag at present 2/2 low plts  Subjective: Interval History: No UOP  Objective: Vital signs in last 24 hours: Temp:  [97.6 F (36.4 C)-98.5 F (36.9 C)] 97.8 F (36.6 C) (05/29 0304) Pulse Rate:  [77-115] 91 (05/29 0304) Resp:  [13-22] 14 (05/29 0304) BP: (109-135)/(56-82) 133/82 (05/29 0304) SpO2:  [96 %-100 %] 99 % (05/29 0304) Weight:  [80.3 kg (177 lb 0.5 oz)] 80.3 kg (177 lb 0.5 oz) (05/29 0328) Weight change: -2.6 kg (-5 lb 11.7 oz)  Intake/Output from previous day: 05/28 0701 - 05/29 0700 In: 845 [P.O.:840; IV Piggyback:5] Out: 350  [Stool:350] Intake/Output this shift: No intake/output data recorded.  General appearance: alert and cooperative GI: soft, non-tender; bowel sounds normal; no masses,  no organomegaly and colostomy bag Extremities: edema 1+  Lab Results: Recent Labs    10/23/17 0829 10/24/17 0340  WBC 5.8 5.6  HGB 8.2* 9.3*  HCT 26.1* 30.0*  PLT 127* 140*   BMET:  Recent Labs    10/24/17 0340 10/25/17 0312  NA 135 136  K 3.5 3.3*  CL 97* 96*  CO2 26 27  GLUCOSE 144* 108*  BUN 32* 54*  CREATININE 3.62* 4.95*  CALCIUM 8.5* 9.0   No results for input(s): PTH in the last 72 hours. Iron Studies: No results for input(s): IRON, TIBC, TRANSFERRIN, FERRITIN in the last 72 hours. Studies/Results: Dg Chest Port 1 View  Result Date: 10/23/2017 CLINICAL DATA:  CHF.  End-stage renal disease.  Dialysis patient. EXAM: PORTABLE CHEST 1 VIEW COMPARISON:  10/09/2017 and older exams. FINDINGS: Cardiac silhouette is normal in size. No mediastinal or hilar masses. Right internal jugular dual-lumen tunneled central venous catheter has its tip projecting at the caval atrial junction. Lungs are clear.  No convincing pleural effusion.  No pneumothorax. Skeletal structures are grossly intact. IMPRESSION: No acute cardiopulmonary disease. Electronically Signed   By: Lajean Manes M.D.   On: 10/23/2017 13:39    Scheduled: . Chlorhexidine Gluconate Cloth  6 each Topical Q0600  . Chlorhexidine Gluconate Cloth  6 each Topical Q0600  . darbepoetin (ARANESP) injection - DIALYSIS  200 mcg Intravenous Q Mon-HD  . feeding supplement (NEPRO CARB STEADY)  237 mL Oral TID BM  .  pantoprazole  40 mg Oral Daily  . sodium chloride flush  10-40 mL Intracatheter Q12H    LOS: 19 days   Estanislado Emms 10/25/2017,8:27 AM

## 2017-10-25 NOTE — Progress Notes (Signed)
[facility] PROGRESS NOTE  DEANGLO HISSONG RSW:[id number] DOB: 06/23/1950 DOA: 10/06/2017  PCP: Patient, [name]  Brief History/Interval Summary: 67 yo M SNF resident w/ a Hx CAD, Chronic Systolic CHF (EF 09-38%), remote Colon Cancer w/ perm end colostomy in 2018, CKD Stage III-IV, SBO, and an enlarged prostate who presented to the ED 5/10 after being found w/ altered LOC and several lab abnormalities including cr 13.35 and K 5.79mHgb 7.5, and hypoglycemia. He was also hypotensive w/ SBP in 70s. He initially underwent CRRT. Now getting intermittent dialysis.  Significant Events/Procedures: : 5/10 admit - transfused 1U PRBC - Vascath placed - CRRT initiated 5/13 CRRT stopped  5/11 TTE - diffuse hypokinesis - EF 35-40% - mild mitral regurg 5/16 IHD Patient was transferred to hospitalist service on 10/11/2017  Consultants: Nephrology.  Previously seen by critical care medicine.  Subjective/Interval History: Patient states that he feels well.  Short of breath on and off but none currently.  Denies any chest pain.  No nausea vomiting.  ROS: Denies any headaches.  Objective:  Vital Signs  Vitals:   10/24/17 1930 10/25/17 0004 10/25/17 0304 10/25/17 0328  BP: 133/67 (!) 109/56 133/82   Pulse: 77 90 91   Resp: (!) _0 Temp: 97.8 F (36.6 C) 97.6 F (36.4 C) 97.8 F (36.6 C)   TempSrc: Oral Oral Oral   SpO2: 100% 98% 99%   Weight:    80.3 kg (177 lb 0.5 oz)  Height:        Intake/Output Summary (Last 24 hours) at 10/25/2017 1033 Last data filed at 10/24/2017 1900 Gross per 24 hour  Intake 605 ml  Output 100 ml  Net 505 ml   Filed Weights   10/23/17 0800 10/24/17 0329 10/25/17 0328  Weight: 82.9 kg (182 lb 12.2 oz) 80.3 kg (177 lb) 80.3 kg (177 lb 0.5 oz)    General appearance: alert, cooperative, appears stated age and no distress Resp: Diminished air entry at the bases. No wheezing rales or rhonchi. Cardio: regular rate and rhythm, S1, S2 normal, no  murmur, click, rub or gallop GI: soft, non-tender; bowel sounds normal; no masses,  no organomegaly.  Ostomy noted with brown stool. Extremities: Edema noted bilateral lower extremities Neurologic: Awake alert.  No obvious focal neurological deficits.  Lab Results:  Data Reviewed: I have personally reviewed following labs and imaging studies  CBC: Recent Labs  Lab 10/20/17 0253 10/21/17 0312 10/22/17 0208 10/23/17 0829 10/24/17 0340  WBC 3.2* 3.4* 4.5 5.8 5.6  NEUTROABS 2.0 1.9 2.9  --   --   HGB 8.9* 9.1* 9.3* 8.2* 9.3*  HCT 28.7* 29.3* 30.3* 26.1* 30.0*  MCV 88.0 87.7 87.6 85.9 86.2  PLT 86* 106* 118* 127* 140*    Basic Metabolic Panel: Recent Labs  Lab 10/21/17 0312 10/22/17 0208 10/23/17 0240 10/24/17 0340 10/25/17 0312  NA 134* 133* 133* 135 136  K 3.7 3.7 4.3 3.5 3.3*  CL 96* 95* 95* 97* 96*  CO2 _1 GLUCOSE 88 99 109* 144* 108*  BUN 21* 30* 50* 32* 54*  CREATININE 3.23* 4.44* 5.74* 3.62* 4.95*  CALCIUM 8.5* 8.6* 8.7* 8.5* 9.0  PHOS 3.2 4.4 5.4* 3.3 4.0    GFR: Estimated Creatinine Clearance: 14 mL/min (A) (by C-G formula based on SCr of 4.95 mg/dL (H)).  Liver Function Tests: Recent Labs  Lab 10/21/17 0312 10/22/17 0208 10/23/17 0240 10/24/17 0340 10/25/17 0312  ALBUMIN 2.3* 2.3*  2.4* 2.5* 2.4*     Radiology Studies: Dg Chest Port 1 View  Result Date: 10/23/2017 CLINICAL DATA:  CHF.  End-stage renal disease.  Dialysis patient. EXAM: PORTABLE CHEST 1 VIEW COMPARISON:  10/09/2017 and older exams. FINDINGS: Cardiac silhouette is normal in size. No mediastinal or hilar masses. Right internal jugular dual-lumen tunneled central venous catheter has its tip projecting at the caval atrial junction. Lungs are clear.  No convincing pleural effusion.  No pneumothorax. Skeletal structures are grossly intact. IMPRESSION: No acute cardiopulmonary disease. Electronically Signed   By: Lajean Manes M.D.   On: 10/23/2017 13:39     Medications:    Scheduled: . Chlorhexidine Gluconate Cloth  6 each Topical Q0600  . darbepoetin (ARANESP) injection - DIALYSIS  200 mcg Intravenous Q Mon-HD  . feeding supplement (NEPRO CARB STEADY)  237 mL Oral TID BM  . pantoprazole  40 mg Oral Daily  . sodium chloride flush  10-40 mL Intracatheter Q12H   Continuous: . sodium chloride     FSF:SELTRV chloride, baclofen, oxyCODONE-acetaminophen, sodium chloride flush, traZODone  Assessment/Plan:    Acute on chronic kidney disease stage III versus 4 Presented with a creatinine of 4.4.  Baseline creatinine noted to be 1.6.  Thought to be secondary to ATN in the setting of septic shock.  Nephrology is following.  Patient was initially on CRRT.  Patient had a dialysis catheter placed by interventional radiology.  He has been intermittently dialyzed.  Nephrology to determine further course of action.  Patient remains anuric.  Severe osteoarthritis involving left wrist Patient had pain and swelling.  Doppler ultrasound negative for DVT.  X-ray showed osteoarthritis.  Uric acid level was low.  ESR and CRP mildly elevated.  Patient was placed on prednisone for 3 days along with a wrist splint.  Feels much better now.  Enterobacter UTI He completed a course of meropenem on 5/18  Anemia of chronic kidney disease Has received 4 units of PRBCs this admission.  Last transfused on 5/16.  He is on Aranesp weekly.  Globin remains stable.  Thrombocytopenia, acute on chronic Thought to be secondary to sepsis.  DIC panel was equivocal.  No heparin products.  Platelet count continues to improve.  SRA results were negative.  Acute metabolic encephalopathy Secondary to his acute illness.  Now resolved.  Hypovolemic septic shock Now resolved.  Paroxysmal atrial fibrillation, bradycardia with frequent pauses Not on anticoagulation due to thrombocytopenia.  Was on apixaban previously.  Platelet counts have improved.  If they continue in this trajectory we could put  him back on apixaban.  However we will also wait to see if he needs to undergo any further procedures for further management of his renal failure.  History of colon cancer Status post colostomy in the past.  Stable.   DVT Prophylaxis: SCDs    Code Status: Partial Family Communication: Discussed with the patient.  No family at bedside. Disposition Plan: Management as outlined above.  Await renal function to stabilize.      LOS: 19 days   [facility] Pager [phone] 10/25/2017, 10:33 AM  If 7PM-7AM, please contact night-coverage at [email or web address], password [facility]

## 2017-10-26 LAB — RENAL FUNCTION PANEL
ALBUMIN: 2.1 g/dL — AB (ref 3.5–5.0)
ANION GAP: 12 (ref 5–15)
BUN: 33 mg/dL — ABNORMAL HIGH (ref 6–20)
CO2: 28 mmol/L (ref 22–32)
Calcium: 8.1 mg/dL — ABNORMAL LOW (ref 8.9–10.3)
Chloride: 97 mmol/L — ABNORMAL LOW (ref 101–111)
Creatinine, Ser: 3.35 mg/dL — ABNORMAL HIGH (ref 0.61–1.24)
GFR, EST AFRICAN AMERICAN: 20 mL/min — AB (ref >60)
GFR, EST NON AFRICAN AMERICAN: 17 mL/min — AB (ref >60)
GLUCOSE: 89 mg/dL (ref 65–99)
PHOSPHORUS: 2 mg/dL — AB (ref 2.5–4.6)
POTASSIUM: 2.5 mmol/L — AB (ref 3.5–5.1)
Sodium: 137 mmol/L (ref 135–145)

## 2017-10-26 MED ORDER — POTASSIUM CHLORIDE CRYS ER 20 MEQ PO TBCR
40.0000 meq | EXTENDED_RELEASE_TABLET | ORAL | Status: AC
  Administered 2017-10-26 (×2): 40 meq via ORAL
  Filled 2017-10-26 (×2): qty 2

## 2017-10-26 NOTE — Progress Notes (Signed)
[facility] Rounding Note  Background 67 y.o. yo male NHP, wheelchair bound, PMH CAD, HTN, HLD, BPH, CM with EF 35%, RV and LV dysfunction, baseline CKD3 (creatinine 1.48 - 1.78 and on losartan outpt).Admitted 10/06/2017 with creatinine 14, AMS ->urosepsis and AKI (ATN felt 2/2 septic shock). CRRT 5/10-5/13 ([place] cath 5/10) then transitioned to IHD 5/16, dialysis dependent since then.Renal US 5/10 12.1, 12.4 echodense. Of note had chronic indwelling foley since May 2018 due to inability to empty bladder, removed this admission, doing daily bladder scans to monitor for UOP.  Assessment/Plan:  AKI on CKD3 - 2/2 ATN/septic shock from enterobacter urosepsis. CRRT 5/10-5/13, IHD 5/16, 5/18, 5/20, 5/22, 5/24 No evidence return of renal function yet. Currently dialysis dependent & will CLIP for OP HD for AKI 1. Indwelling foley since May 2018 (removed this admission) for inability to pass urine on his own (sounds like BPH). Doing daily bladder scans now as this inability to void issue could mask renal recovery. (but no sigurine yet) 2. Had HD 5/29, orders in for tomorrow 3. Today's bladder scan still pending RN aware to do 4. K low replete orally I see 2 doses of 40 ordered for today by primary care which is fine  2. Urosepsis- enterobacter- meropenem finished 5/18.  3. Anemia- on ESA- no iron needed. Transfused 5/16. Gets Aranesp 200 QMonday. Last dosed 5/27 Thrombocytopenia - acute on chronic NOT HIT. SRA negative. Heparin allergy removed from records. Can use with HD if needed. OK to block catheter with heparin now 4. PAF/bradycardia - no anticoag at present 2/2 low plts. Tachy this AM  Jamal Maes, MD [place] Pager 10/26/2017, 8:28 AM   Subjective: Interval History:  Bladder scan not done yet today Had HD yesterday and will be due for tomorrow Says "not giving up hope"  Objective: Vital signs in last 24 hours: Temp:  [96.9 F (36.1 C)-98.5 F (36.9 C)]  97.9 F (36.6 C) (05/30 0357) Pulse Rate:  [69-112] 72 (05/30 0357) Resp:  [18-23] 23 (05/30 0357) BP: (81-115)/(38-69) 90/50 (05/30 0357) SpO2:  [92 %-100 %] 97 % (05/30 0357) Weight:  [81.2 kg (179 lb 0.2 oz)-83.4 kg (183 lb 13.8 oz)] 81.2 kg (179 lb 0.2 oz) (05/30 0140) Weight change: 3.1 kg (6 lb 13.4 oz)  Intake/Output from previous day: 05/29 0701 - 05/30 0700 In: 960 [P.O.:960] Out: 2227 [Stool:400] Intake/Output this shift: No intake/output data recorded.   Very nice older WM NAD VS as noted Lungs grossly clear S1S2 No S3 Tachy irregular Abd with colostomy, bag full 1+ edema LE's TDC R IJ (5/22)  Recent Labs    10/24/17 0340 10/25/17 1553  WBC 5.6 6.0  HGB 9.3* 9.3*  HCT 30.0* 29.9*  PLT 140* 155   BMET:  Recent Labs    10/25/17 0312 10/26/17 0329  NA 136 137  K 3.3* 2.5*  CL 96* 97*  CO2 27 28  GLUCOSE 108* 89  BUN 54* 33*  CREATININE 4.95* 3.35*  CALCIUM 9.0 8.1*    Scheduled: . Chlorhexidine Gluconate Cloth  6 each Topical Q0600  . darbepoetin (ARANESP) injection - DIALYSIS  200 mcg Intravenous Q Mon-HD  . feeding supplement (NEPRO CARB STEADY)  237 mL Oral TID BM  . pantoprazole  40 mg Oral Daily  . potassium chloride  40 mEq Oral Q4H  . sodium chloride flush  10-40 mL Intracatheter Q12H

## 2017-10-26 NOTE — Progress Notes (Addendum)
[facility] PROGRESS NOTE  Patrick [name] LSL:[id number] DOB: November 20, 1950 DOA: 10/06/2017  PCP: Patient, [name]  Brief History/Interval Summary: 67 yo M SNF resident w/ a Hx CAD, Chronic Systolic CHF (EF 11-57%), remote Colon Cancer w/ perm end colostomy in 2018, CKD Stage III-IV, SBO, and an enlarged prostate who presented to the ED 5/10 after being found w/ altered LOC and several lab abnormalities including cr 13.35 and K 5.32mHgb 7.5, and hypoglycemia. He was also hypotensive w/ SBP in 70s. He initially underwent CRRT. Now getting intermittent dialysis.  Significant Events/Procedures: : 5/10 admit - transfused 1U PRBC - Vascath placed - CRRT initiated 5/13 CRRT stopped  5/11 TTE - diffuse hypokinesis - EF 35-40% - mild mitral regurg 5/16 IHD Patient was transferred to hospitalist service on 10/11/2017  Consultants: Nephrology.  Previously seen by critical care medicine.  Subjective/Interval History: Patient denies any chest pain.  No shortness of breath this morning.  Still no urine.    ROS: Denies any headaches  Objective:  Vital Signs  Vitals:   10/25/17 2011 10/25/17 2338 10/26/17 0140 10/26/17 0357  BP: 105/69 (!) 115/52  (!) 90/50  Pulse: (!) 112 91  72  Resp: (!) 22 (!) 23  (!) 23  Temp:  98.5 F (36.9 C)  97.9 F (36.6 C)  TempSrc: Oral Oral  Oral  SpO2: 100% 96%  97%  Weight:   81.2 kg (179 lb 0.2 oz)   Height:        Intake/Output Summary (Last 24 hours) at 10/26/2017 1150 Last data filed at 10/25/2017 1907 Gross per 24 hour  Intake 480 ml  Output 1977 ml  Net -1497 ml   Filed Weights   10/25/17 1537 10/25/17 1930 10/26/17 0140  Weight: 83.4 kg (183 lb 13.8 oz) 81.3 kg (179 lb 3.7 oz) 81.2 kg (179 lb 0.2 oz)    General appearance: Awake alert.  In no distress. Resp: Diminished air entry at the bases.  No wheezing rales or rhonchi Cardio: S1-S2 is irregularly irregular.  tachycardic this morning.  Telemetry shows heart rate between  100-120. GI: Abdomen is soft.  Nontender nondistended.  Bowel sounds are present.  No masses or organomegaly.  Ostomy with brown stool. Extremities: Edema noted bilateral lower extremities Neurologic: No obvious focal neurological deficits.  Lab Results:  Data Reviewed: I have personally reviewed following labs and imaging studies  CBC: Recent Labs  Lab 10/20/17 0253 10/21/17 0312 10/22/17 0208 10/23/17 0829 10/24/17 0340 10/25/17 1553  WBC 3.2* 3.4* 4.5 5.8 5.6 6.0  NEUTROABS 2.0 1.9 2.9  --   --   --   HGB 8.9* 9.1* 9.3* 8.2* 9.3* 9.3*  HCT 28.7* 29.3* 30.3* 26.1* 30.0* 29.9*  MCV 88.0 87.7 87.6 85.9 86.2 86.7  PLT 86* 106* 118* 127* 140* 1262   Basic Metabolic Panel: Recent Labs  Lab 10/22/17 0208 10/23/17 0240 10/24/17 0340 10/25/17 0312 10/26/17 0329  NA 133* 133* 135 136 137  K 3.7 4.3 3.5 3.3* 2.5*  CL 95* 95* 97* 96* 97*  CO2 _0 GLUCOSE 99 109* 144* 108* 89  BUN 30* 50* 32* 54* 33*  CREATININE 4.44* 5.74* 3.62* 4.95* 3.35*  CALCIUM 8.6* 8.7* 8.5* 9.0 8.1*  PHOS 4.4 5.4* 3.3 4.0 2.0*    GFR: Estimated Creatinine Clearance: 20.8 mL/min (A) (by C-G formula based on SCr of 3.35 mg/dL (H)).  Liver Function Tests: Recent Labs  Lab 10/22/17 0208 10/23/17 0240 10/24/17  0340 10/25/17 0312 10/26/17 0329  ALBUMIN 2.3* 2.4* 2.5* 2.4* 2.1*     Radiology Studies: No results found.   Medications:  Scheduled: . Chlorhexidine Gluconate Cloth  6 each Topical Q0600  . darbepoetin (ARANESP) injection - DIALYSIS  200 mcg Intravenous Q Mon-HD  . feeding supplement (NEPRO CARB STEADY)  237 mL Oral TID BM  . pantoprazole  40 mg Oral Daily  . sodium chloride flush  10-40 mL Intracatheter Q12H   Continuous:  YKZ:LDJTTSVX, oxyCODONE-acetaminophen, traZODone  Assessment/Plan:     Acute on chronic kidney disease stage III versus 4 Presented with a creatinine of 4.4. Baseline creatinine noted to be 1.6. Thought to be secondary to ATN in the  setting of septic shock. Nephrology is following. Patient was initially onCRRT. Patient had a dialysis catheter placed by interventional radiology. He has been intermittently dialyzed. Nephrology to determine further course of action. Patient remains anuric. Potassium level noted to be low today. This will be corrected.  Severe osteoarthritis involving left wrist Patient had pain and swelling. Doppler ultrasound negative for DVT. X-ray showed osteoarthritis. Uric acid level was low. ESR and CRP mildly elevated. Patient was placed on prednisone for 3 days along with a wrist splint. Feels much better now.  Enterobacter UTI He completed a course of meropenem on 5/18  Anemia of chronic kidney disease Has received 4 units of PRBCs this admission. Last transfused on 5/16. He is on Aranesp weekly. Hemoglobin remains stable.  Thrombocytopenia, acute on chronic Thought to be secondary to sepsis. DIC panel was equivocal. No heparin products. Platelet count continues to improve. SRA results were negative. Not likely to be HIT.  Acute metabolic encephalopathy Secondary to his acute illness. Now resolved.  Hypovolemic septic shock Now resolved.  Paroxysmal atrial fibrillation, bradycardia with frequent pauses Not on anticoagulation due to thrombocytopenia. Was on apixaban previously. Platelet counts have improved. If they continue in this trajectory we could put him back on apixaban. However we will also wait to see if he needs to undergo any furtherprocedures for further management of his renal failure. Heart rate noted to be slightly higher than his usual this morning. Telemetry data reviewed. Heart rate between 100-120. Patient is on beta-blocker at home. This has been held due to frequent pauses andbradycardia during the early part of this hospital stay, which could have been due to metabolic derangements. Continue to monitor heart rate for now. If it remains elevated we may have to resume carvedilol at  a lower dose.  History of colon cancer Status post colostomy in the past. Stable.    DVT Prophylaxis: SCDs    Code Status: Partial Family Communication: Discussed with the patient.  No family at bedside. Disposition Plan: Management as outlined above.  Await renal function to stabilize.      LOS: 20 days   [facility] Pager [phone] 10/26/2017, 11:50 AM  If 7PM-7AM, please contact night-coverage at [email or web address], password [facility]

## 2017-10-26 NOTE — Progress Notes (Signed)
Bladder scan x3 - volume each time 4mL

## 2017-10-26 NOTE — Progress Notes (Signed)
K 2.5 with morning labs.  Tylene Fantasia NP paged with lab result.  Waiting further orders.

## 2017-10-26 NOTE — Progress Notes (Signed)
Clinical Social Worker following patient for support. Patient at this time has finished the clipping process per Datrell Dunton(HD Unit). Patient has been clipped to [facility] ([place]). Patents scheduled time is Tu,Th, Sa at 12pm. CSW reached out to [facility] to make them aware of patient dialysis clinic location and schedule.   Rhea Pink, MSW,  [place]

## 2017-10-26 NOTE — Progress Notes (Signed)
Accepted at [facility] .1st treatment is Tuesday June 04,2019 at 11 am .Schedule and chairtime Tuesday,Thursday,Saturday at 12pm .2nd shift

## 2017-10-27 DIAGNOSIS — I959 Hypotension, unspecified: Secondary | ICD-10-CM | POA: Diagnosis not present

## 2017-10-27 DIAGNOSIS — N2581 Secondary hyperparathyroidism of renal origin: Secondary | ICD-10-CM | POA: Diagnosis not present

## 2017-10-27 DIAGNOSIS — D696 Thrombocytopenia, unspecified: Secondary | ICD-10-CM | POA: Diagnosis not present

## 2017-10-27 DIAGNOSIS — R293 Abnormal posture: Secondary | ICD-10-CM | POA: Diagnosis not present

## 2017-10-27 DIAGNOSIS — N390 Urinary tract infection, site not specified: Secondary | ICD-10-CM | POA: Diagnosis not present

## 2017-10-27 DIAGNOSIS — N183 Chronic kidney disease, stage 3 (moderate): Secondary | ICD-10-CM | POA: Diagnosis not present

## 2017-10-27 DIAGNOSIS — N170 Acute kidney failure with tubular necrosis: Secondary | ICD-10-CM | POA: Diagnosis not present

## 2017-10-27 DIAGNOSIS — R52 Pain, unspecified: Secondary | ICD-10-CM | POA: Diagnosis not present

## 2017-10-27 DIAGNOSIS — N179 Acute kidney failure, unspecified: Secondary | ICD-10-CM | POA: Diagnosis not present

## 2017-10-27 DIAGNOSIS — N186 End stage renal disease: Secondary | ICD-10-CM | POA: Diagnosis not present

## 2017-10-27 DIAGNOSIS — R488 Other symbolic dysfunctions: Secondary | ICD-10-CM | POA: Diagnosis not present

## 2017-10-27 DIAGNOSIS — R278 Other lack of coordination: Secondary | ICD-10-CM | POA: Diagnosis not present

## 2017-10-27 DIAGNOSIS — M6281 Muscle weakness (generalized): Secondary | ICD-10-CM | POA: Diagnosis not present

## 2017-10-27 DIAGNOSIS — N289 Disorder of kidney and ureter, unspecified: Secondary | ICD-10-CM | POA: Diagnosis not present

## 2017-10-27 DIAGNOSIS — D6959 Other secondary thrombocytopenia: Secondary | ICD-10-CM | POA: Diagnosis not present

## 2017-10-27 DIAGNOSIS — R279 Unspecified lack of coordination: Secondary | ICD-10-CM | POA: Diagnosis not present

## 2017-10-27 DIAGNOSIS — R2681 Unsteadiness on feet: Secondary | ICD-10-CM | POA: Diagnosis not present

## 2017-10-27 DIAGNOSIS — G9341 Metabolic encephalopathy: Secondary | ICD-10-CM | POA: Diagnosis not present

## 2017-10-27 DIAGNOSIS — A419 Sepsis, unspecified organism: Secondary | ICD-10-CM | POA: Diagnosis not present

## 2017-10-27 DIAGNOSIS — D689 Coagulation defect, unspecified: Secondary | ICD-10-CM | POA: Diagnosis not present

## 2017-10-27 DIAGNOSIS — R1314 Dysphagia, pharyngoesophageal phase: Secondary | ICD-10-CM | POA: Diagnosis not present

## 2017-10-27 DIAGNOSIS — M19032 Primary osteoarthritis, left wrist: Secondary | ICD-10-CM | POA: Diagnosis not present

## 2017-10-27 DIAGNOSIS — E872 Acidosis: Secondary | ICD-10-CM | POA: Diagnosis not present

## 2017-10-27 DIAGNOSIS — C189 Malignant neoplasm of colon, unspecified: Secondary | ICD-10-CM | POA: Diagnosis not present

## 2017-10-27 DIAGNOSIS — Z4901 Encounter for fitting and adjustment of extracorporeal dialysis catheter: Secondary | ICD-10-CM | POA: Diagnosis not present

## 2017-10-27 DIAGNOSIS — E869 Volume depletion, unspecified: Secondary | ICD-10-CM | POA: Diagnosis not present

## 2017-10-27 DIAGNOSIS — Z743 Need for continuous supervision: Secondary | ICD-10-CM | POA: Diagnosis not present

## 2017-10-27 DIAGNOSIS — I509 Heart failure, unspecified: Secondary | ICD-10-CM | POA: Diagnosis not present

## 2017-10-27 DIAGNOSIS — D631 Anemia in chronic kidney disease: Secondary | ICD-10-CM | POA: Diagnosis not present

## 2017-10-27 DIAGNOSIS — R2689 Other abnormalities of gait and mobility: Secondary | ICD-10-CM | POA: Diagnosis not present

## 2017-10-27 DIAGNOSIS — I480 Paroxysmal atrial fibrillation: Secondary | ICD-10-CM | POA: Diagnosis not present

## 2017-10-27 DIAGNOSIS — J9601 Acute respiratory failure with hypoxia: Secondary | ICD-10-CM | POA: Diagnosis not present

## 2017-10-27 DIAGNOSIS — G4700 Insomnia, unspecified: Secondary | ICD-10-CM | POA: Diagnosis not present

## 2017-10-27 DIAGNOSIS — D509 Iron deficiency anemia, unspecified: Secondary | ICD-10-CM | POA: Diagnosis not present

## 2017-10-27 LAB — CBC
HCT: 29 % — ABNORMAL LOW (ref 39.0–52.0)
Hemoglobin: 9 g/dL — ABNORMAL LOW (ref 13.0–17.0)
MCH: 27.1 pg (ref 26.0–34.0)
MCHC: 31 g/dL (ref 30.0–36.0)
MCV: 87.3 fL (ref 78.0–100.0)
PLATELETS: 158 10*3/uL (ref 150–400)
RBC: 3.32 MIL/uL — ABNORMAL LOW (ref 4.22–5.81)
RDW: 20.6 % — AB (ref 11.5–15.5)
WBC: 5.3 10*3/uL (ref 4.0–10.5)

## 2017-10-27 LAB — RENAL FUNCTION PANEL
ALBUMIN: 2.4 g/dL — AB (ref 3.5–5.0)
Anion gap: 12 (ref 5–15)
BUN: 62 mg/dL — AB (ref 6–20)
CO2: 24 mmol/L (ref 22–32)
CREATININE: 4.93 mg/dL — AB (ref 0.61–1.24)
Calcium: 8.4 mg/dL — ABNORMAL LOW (ref 8.9–10.3)
Chloride: 100 mmol/L — ABNORMAL LOW (ref 101–111)
GFR calc Af Amer: 13 mL/min — ABNORMAL LOW (ref >60)
GFR, EST NON AFRICAN AMERICAN: 11 mL/min — AB (ref >60)
GLUCOSE: 89 mg/dL (ref 65–99)
POTASSIUM: 3.4 mmol/L — AB (ref 3.5–5.1)
Phosphorus: 2 mg/dL — ABNORMAL LOW (ref 2.5–4.6)
Sodium: 136 mmol/L (ref 135–145)

## 2017-10-27 MED ORDER — OXYCODONE-ACETAMINOPHEN 5-325 MG PO TABS: 1.0000 | ORAL_TABLET | Freq: Four times a day (QID) | ORAL | 0 refills | Status: DC | PRN

## 2017-10-27 MED ORDER — BACLOFEN 5 MG PO TABS: 5.0000 mg | ORAL_TABLET | Freq: Three times a day (TID) | ORAL | Status: DC | PRN

## 2017-10-27 MED ORDER — PANTOPRAZOLE SODIUM 40 MG PO TBEC: 40.0000 mg | DELAYED_RELEASE_TABLET | Freq: Every day | ORAL | Status: AC

## 2017-10-27 MED ORDER — TRAZODONE HCL 50 MG PO TABS: 50.0000 mg | ORAL_TABLET | Freq: Every evening | ORAL | Status: DC | PRN

## 2017-10-27 MED ORDER — NEPRO/CARBSTEADY PO LIQD: 237.0000 mL | Freq: Three times a day (TID) | ORAL | 0 refills | Status: DC

## 2017-10-27 MED ORDER — OXYCODONE-ACETAMINOPHEN 5-325 MG PO TABS
ORAL_TABLET | ORAL | Status: AC
  Administered 2017-10-27: 1 via ORAL
  Filled 2017-10-27: qty 1

## 2017-10-27 MED ORDER — WARFARIN SODIUM 2 MG PO TABS: ORAL_TABLET | ORAL | Status: DC

## 2017-10-27 NOTE — Progress Notes (Signed)
Patient arrived to unit by bed.  Reviewed treatment plan and this RN agrees with plan.  Report received from bedside RN, Anda Kraft.  Consent verified.  Patient A & O X 4.   Lung sounds diminished and coarse to ausculation in all fields. Generalized 1+ pitting edema. Cardiac:  Afib, frequent PVC.  Removed caps and cleansed RIJ catheter with chlorhedxidine.  Aspirated ports of heparin and flushed them with saline per protocol.  Connected and secured lines, initiated treatment at 1114.  UF Goal of 1500 mL and net fluid removal 1 L.  Will continue to monitor.

## 2017-10-27 NOTE — Discharge Summary (Signed)
[facility]  Physician Discharge Summary   Patient ID: Patrick [name] MRN: [id number] DOB/AGE: [birth year] 67 y.o.  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 Patient has been established in the [facility] dialysis center to start dialysis on Tuesday, June 4. 2. Will need PT/INR per protocol for warfarin.  Goal INR 2-3. 3. Will need to periodically check platelet counts.  DISCHARGE CONDITION: fair  Diet recommendation: Renal diet with 1200 mL fluid restriction per day  Filed Weights   10/26/17 0140 10/27/17 0149 10/27/17 1109  Weight: 81.2 kg (179 lb 0.2 oz) 82.6 kg (182 lb 1.6 oz) 82.6 kg (182 lb 1.6 oz)    INITIAL HISTORY: 67 yo M SNF resident w/ a Hx CAD, Chronic Systolic CHF (EF 76-54%), remote Colon Cancer w/ perm end colostomy in 2018, CKD Stage III-IV, SBO, and an enlarged prostate who presented to the ED 5/10 after being found w/ altered LOC and several lab abnormalities including cr 13.35 and K 5.5mHgb 7.5, and hypoglycemia. He was also hypotensive w/ SBP in 70s. He initially underwent CRRT. Now getting intermittent dialysis.  Significant Events/Procedures: : 5/10 admit - transfused 1U PRBC - Vascath placed - CRRT initiated 5/13 CRRT stopped  5/11 TTE - diffuse hypokinesis - EF 35-40% - mild mitral regurg 5/16 IHD Patient was transferred to hospitalist service on 10/11/2017  Consultants: Nephrology.  Previously seen by critical care medicine.   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 Baseline creatinine noted to be 1.6. Thought to be secondary to ATN in the setting of septic shock. Nephrology was consulted. Patient was initially on CRRT due to hypotension. Patient had a dialysis catheter placed by interventional radiology. He has been  intermittently dialyzed.   He has been set up for outpatient dialysis.  [facility]    He was dialyzed today, 5/31.  Next dialysis will be on June 4.  Cleared by nephrology for discharge.  Patient remains anuric.  He was hypokalemic and was given potassium supplementation.  Potassium level has improved.    Severe osteoarthritis involving left wrist Patient had pain and swelling. Doppler ultrasound negative for DVT. X-ray showed osteoarthritis. Uric acid level was low. ESR and CRP mildly elevated. Patient was given prednisone for 3 days along with a wrist splint. Feels much better now.  Enterobacter UTI He completed a course of meropenem on 5/18  Anemia of chronic kidney disease Has received 4 units of PRBCs this admission. Last transfused on 5/16. He is on Aranesp weekly. Hemoglobin remains stable.  Thrombocytopenia, acute on chronic Thought to be secondary to sepsis. DIC panel was equivocal. No heparin products. Platelet count continues to improve. SRA results were negative. Not likely to be HIT.  Platelet counts have improved.    Will need to be monitored periodically.  Acute metabolic encephalopathy Secondary to his acute illness. Now resolved.  Hypovolemic septic shock Now resolved.  Paroxysmal atrial fibrillation, bradycardia with frequent pauses Heart rate is reasonably well controlled for the most part.  Telemetry continues to show occasional pauses with brief periods of bradycardia which has been asymptomatic.  In view of this we will hold off on reinitiating any kind of rate limiting drugs.  Patient was not continued on anticoagulation due  to thrombocytopenia. Was on apixaban previously.  Platelet counts continue to improve.  Now that he appears to be heading towards end-stage renal disease may have to consider warfarin instead of apixaban.    Initiate warfarin at skilled nursing facility with close monitoring of  platelet counts.  INR goal 2-3.  No need for any bridging.  History of colon cancer Status post colostomy in the past. Stable.   Overall stable.  Cleared by nephrology today.  His Skilled nursing facility would prefer that he come over today instead of over the weekend.  Okay for discharge today.     PERTINENT LABS:  The results of significant diagnostics from this hospitalization (including imaging, microbiology, ancillary and laboratory) are listed below for reference.     Labs: Basic Metabolic Panel: Recent Labs  Lab 10/23/17 0240 10/24/17 0340 10/25/17 0312 10/26/17 0329 10/27/17 0146  NA 133* 135 136 137 136  K 4.3 3.5 3.3* 2.5* 3.4*  CL 95* 97* 96* 97* 100*  CO2 _0 GLUCOSE 109* 144* 108* 89 89  BUN 50* 32* 54* 33* 62*  CREATININE 5.74* 3.62* 4.95* 3.35* 4.93*  CALCIUM 8.7* 8.5* 9.0 8.1* 8.4*  PHOS 5.4* 3.3 4.0 2.0* 2.0*   Liver Function Tests: Recent Labs  Lab 10/23/17 0240 10/24/17 0340 10/25/17 0312 10/26/17 0329 10/27/17 0146  ALBUMIN 2.4* 2.5* 2.4* 2.1* 2.4*   CBC: Recent Labs  Lab 10/21/17 0312 10/22/17 0208 10/23/17 0829 10/24/17 0340 10/25/17 1553 10/27/17 1028  WBC 3.4* 4.5 5.8 5.6 6.0 5.3  NEUTROABS 1.9 2.9  --   --   --   --   HGB 9.1* 9.3* 8.2* 9.3* 9.3* 9.0*  HCT 29.3* 30.3* 26.1* 30.0* 29.9* 29.0*  MCV 87.7 87.6 85.9 86.2 86.7 87.3  PLT 106* 118* 127* 140* 155 158    IMAGING STUDIES Dg Wrist Complete Left  Result Date: 10/21/2017 CLINICAL DATA:  Acute left wrist pain without known injury. EXAM: LEFT WRIST - COMPLETE 3+ VIEW COMPARISON:  None. FINDINGS: There is no evidence of fracture or dislocation. Moderate narrowing and sclerosis is seen involving the first carpometacarpal joint. Soft tissues are unremarkable. IMPRESSION: Osteoarthritis of the first carpometacarpal joint. No acute abnormality seen in the left wrist. Electronically Signed   By: Marijo Conception, M.D.   On: 10/21/2017 09:48   Dg Abd 1 View  Result  Date: 10/06/2017 CLINICAL DATA:  Orogastric tube placement. EXAM: ABDOMEN - 1 VIEW COMPARISON:  06/01/2016. FINDINGS: Orogastric tube tip in the proximal stomach and side hole in the proximal to mid stomach. Gaseous distention of the stomach. Again demonstrated are 3 large gallstones in the gallbladder measuring up to 2.6 cm in maximum diameter each. No bowel gas visualized except for the gas in the stomach. Patchy opacity in the left lower lobe. Lumbar spine degenerative changes and minimal scoliosis. IMPRESSION: 1. Gaseous distention of the stomach with no other bowel gas seen. This is concerning for gastric outlet obstruction. 2. Cholelithiasis. 3. Patchy left lower lobe atelectasis or pneumonia. Electronically Signed   By: Claudie Revering M.D.   On: 10/06/2017 19:55   US Renal  Result Date: 10/06/2017 CLINICAL DATA:  67 year old male with acute renal failure. EXAM: RENAL / URINARY TRACT ULTRASOUND COMPLETE COMPARISON:  10/06/2016 ultrasound and 03/06/2017 MR FINDINGS: Right Kidney: Length: 12.1 cm. Diffuse increased echogenicity noted. No solid mass or hydronephrosis noted. Left Kidney: Length: 12.4 cm. Diffuse increased echogenicity noted. No solid mass or hydronephrosis identified. A 1.5 cm cyst  is again identified. Bladder: A Foley catheter is present within a collapsed bladder. Incidental note is made of ascites. IMPRESSION: 1. Echogenic kidneys bilaterally compatible with medical renal disease. No evidence of hydronephrosis. 2. Ascites Electronically Signed   By: Margarette Canada M.D.   On: 10/06/2017 16:41   Ir Fluoro Guide Cv Line Right  Result Date: 10/18/2017 INDICATION: End-stage renal disease EXAM: TUNNELED DIALYSIS CATHETER PLACEMENT, ULTRASOUND GUIDANCE FOR VASCULAR ACCESS MEDICATIONS: Ancef 2 g; The antibiotic was administered within an appropriate time interval prior to skin puncture. ANESTHESIA/SEDATION: Versed 0.5 mg IV; Fentanyl 25 mcg IV; Moderate Sedation Time:  12 minutes The patient was  continuously monitored during the procedure by the interventional radiology nurse under my direct supervision. FLUOROSCOPY TIME:  Fluoroscopy Time:  minutes  seconds ( mGy). COMPLICATIONS: None immediate. PROCEDURE: Informed written consent was obtained from the patient after a thorough discussion of the procedural risks, benefits and alternatives. All questions were addressed. Maximal Sterile Barrier Technique was utilized including caps, mask, sterile gowns, sterile gloves, sterile drape, hand hygiene and skin antiseptic. A timeout was performed prior to the initiation of the procedure. The right neck was prepped with ChloraPrep in a sterile fashion, and a sterile drape was applied covering the operative field. A sterile gown and sterile gloves were used for the procedure. 1% lidocaine into the skin and subcutaneous tissue. The right jugular vein was noted to be patent initially with ultrasound. Under sonographic guidance, a micropuncture needle was inserted into the right IJ vein (Ultrasound and fluoroscopic image documentation was performed). It was removed over an 018 wire which was up-sized to an Amplatz. This was advanced into the IVC. A small incision was made in the right upper chest. The tunneling device was utilized to advance the 23 centimeter tip to cuff catheter from the chest incision and out the neck incision. A peel-away sheath was advanced over the Amplatz wire. The leading edge of the catheter was then advanced through the peel-away sheath. The peel-away sheath was removed. It was flushed and instilled with heparin. The chest incision was closed with a 0 Prolene pursestring stitch. The neck incision was closed with a 4-0 Vicryl subcuticular stitch. IMPRESSION: Successful right IJ vein tunneled dialysis catheter with its tip in the right atrium. Electronically Signed   By: Marybelle Killings M.D.   On: 10/18/2017 14:53   Ir US Guide Vasc Access Right  Result Date: 10/18/2017 INDICATION: End-stage  renal disease EXAM: TUNNELED DIALYSIS CATHETER PLACEMENT, ULTRASOUND GUIDANCE FOR VASCULAR ACCESS MEDICATIONS: Ancef 2 g; The antibiotic was administered within an appropriate time interval prior to skin puncture. ANESTHESIA/SEDATION: Versed 0.5 mg IV; Fentanyl 25 mcg IV; Moderate Sedation Time:  12 minutes The patient was continuously monitored during the procedure by the interventional radiology nurse under my direct supervision. FLUOROSCOPY TIME:  Fluoroscopy Time:  minutes  seconds ( mGy). COMPLICATIONS: None immediate. PROCEDURE: Informed written consent was obtained from the patient after a thorough discussion of the procedural risks, benefits and alternatives. All questions were addressed. Maximal Sterile Barrier Technique was utilized including caps, mask, sterile gowns, sterile gloves, sterile drape, hand hygiene and skin antiseptic. A timeout was performed prior to the initiation of the procedure. The right neck was prepped with ChloraPrep in a sterile fashion, and a sterile drape was applied covering the operative field. A sterile gown and sterile gloves were used for the procedure. 1% lidocaine into the skin and subcutaneous tissue. The right jugular vein was noted to be patent initially with ultrasound.  Under sonographic guidance, a micropuncture needle was inserted into the right IJ vein (Ultrasound and fluoroscopic image documentation was performed). It was removed over an 018 wire which was up-sized to an Amplatz. This was advanced into the IVC. A small incision was made in the right upper chest. The tunneling device was utilized to advance the 23 centimeter tip to cuff catheter from the chest incision and out the neck incision. A peel-away sheath was advanced over the Amplatz wire. The leading edge of the catheter was then advanced through the peel-away sheath. The peel-away sheath was removed. It was flushed and instilled with heparin. The chest incision was closed with a 0 Prolene pursestring  stitch. The neck incision was closed with a 4-0 Vicryl subcuticular stitch. IMPRESSION: Successful right IJ vein tunneled dialysis catheter with its tip in the right atrium. Electronically Signed   By: Marybelle Killings M.D.   On: 10/18/2017 14:53   Dg Chest Port 1 View  Result Date: 10/23/2017 CLINICAL DATA:  CHF.  End-stage renal disease.  Dialysis patient. EXAM: PORTABLE CHEST 1 VIEW COMPARISON:  10/09/2017 and older exams. FINDINGS: Cardiac silhouette is normal in size. No mediastinal or hilar masses. Right internal jugular dual-lumen tunneled central venous catheter has its tip projecting at the caval atrial junction. Lungs are clear.  No convincing pleural effusion.  No pneumothorax. Skeletal structures are grossly intact. IMPRESSION: No acute cardiopulmonary disease. Electronically Signed   By: Lajean Manes M.D.   On: 10/23/2017 13:39   Dg Chest Port 1 View  Result Date: 10/09/2017 CLINICAL DATA:  Shortness of breath EXAM: PORTABLE CHEST 1 VIEW COMPARISON:  10/08/2017 FINDINGS: Cardiac shadow remains mildly enlarged. Endotracheal tube, nasogastric catheter and left jugular temporary dialysis catheter are again seen and stable. The lungs are hypoinflated. Some persistent left retrocardiac density is noted. No new focal abnormality is noted. IMPRESSION: No significant change from the previous day. Electronically Signed   By: Inez Catalina M.D.   On: 10/09/2017 07:10   Dg Chest Port 1 View  Result Date: 10/08/2017 CLINICAL DATA:  Endotracheal tube placement. EXAM: PORTABLE CHEST 1 VIEW COMPARISON:  Oct 07, 2017 FINDINGS: Mediastinal contour is stable. The heart size is enlarged. Endotracheal tube is identified distal tip 4.9 cm from carina. A left central venous line is identified distal tip in the superior vena cava. A nasogastric tube is noted with distal tip not included on film but is probably in the stomach. Mild patchy opacities identified in the left lung base. There is no pulmonary edema or  pleural effusion. The visualized skeletal structures are stable. IMPRESSION: Life supporting devices as described. Endotracheal tube in good position. Cardiomegaly. Patchy opacity of the medial left lung base probably due to atelectasis but superimposed pneumonia is not excluded. Electronically Signed   By: Abelardo Diesel M.D.   On: 10/08/2017 08:03   Dg Chest Port 1 View  Result Date: 10/07/2017 CLINICAL DATA:  Respiratory failure EXAM: PORTABLE CHEST 1 VIEW COMPARISON:  Oct 06, 2017 FINDINGS: The ETT, left central line, and NG tube are again identified and stable within visualize limits. No pneumothorax. Stable cardiomegaly. The hila and mediastinum are unchanged. Mild opacity in the medial right lung base may represent atelectasis, vascular crowding, or subtle infiltrate. Recommend attention on follow-up. No other acute abnormalities. IMPRESSION: 1. Stable support apparatus as above. 2. Mild increased opacity in the medial right lung base could represent vascular crowding, atelectasis, or subtle infiltrate. Recommend attention on follow-up. Electronically Signed   By: Shanon Brow  Mee Hives M.D   On: 10/07/2017 07:47   Dg Chest Port 1 View  Result Date: 10/06/2017 CLINICAL DATA:  Intubated. EXAM: PORTABLE CHEST 1 VIEW COMPARISON:  Earlier today. FINDINGS: Endotracheal tube tip 3.6 cm above the carina. Left jugular catheter unchanged. Stable enlarged cardiac silhouette and prominent interstitial markings. Mild increase in patchy opacity in the left lower lobe. Minimal right basilar atelectasis with improvement. No acute bony abnormality. IMPRESSION: 1. Endotracheal tube tip 3.5 cm above the carina. This could be retracted 3.5 cm to place it at the level of the clavicles. 2. Mildly increased patchy atelectasis or pneumonia in the left lower lobe. 3. Minimal right basilar atelectasis with improvement. 4. Stable cardiomegaly and chronic interstitial lung disease. Electronically Signed   By: Claudie Revering M.D.    On: 10/06/2017 19:53   Dg Chest Port 1 View  Result Date: 10/06/2017 CLINICAL DATA:  Encounter for central line placement. EXAM: PORTABLE CHEST 1 VIEW COMPARISON:  Radiograph of same day. FINDINGS: Stable cardiomegaly. Hypoinflation of the lungs is noted with mild bibasilar subsegmental atelectasis. Interval placement of left internal jugular dialysis catheter with distal tip in expected position of the SVC. No pneumothorax or significant pleural effusion is noted. Bony thorax is unremarkable. IMPRESSION: Interval placement of left internal jugular dialysis catheter with distal tip in expected position of SVC. No pneumothorax is noted. Hypoinflation of the lungs with mild bibasilar subsegmental atelectasis. Electronically Signed   By: Marijo Conception, M.D.   On: 10/06/2017 15:23   Dg Chest Portable 1 View  Result Date: 10/06/2017 CLINICAL DATA:  67 year old male with a history of hypotension EXAM: PORTABLE CHEST 1 VIEW COMPARISON:  05/23/2017, 10/08/2016 FINDINGS: Cardiomediastinal silhouette unchanged, likely accentuated by apical lordotic positioning. Persisting cardiomegaly. No pneumothorax.  No large pleural effusion.  Low lung volumes. IMPRESSION: Redemonstration of cardiomegaly. The accentuated appearance is favored to be secondary to the apical lordotic positioning, however, new pericardial effusion not excluded. If there is concern for pericardial effusion, recommend correlation with ECHO. No evidence of lobar pneumonia or overt edema. Electronically Signed   By: Corrie Mckusick D.O.   On: 10/06/2017 12:15   Dg Abd Portable 1v  Result Date: 10/08/2017 CLINICAL DATA:  Gastric outlet obstruction EXAM: PORTABLE ABDOMEN - 1 VIEW COMPARISON:  Portable exam 1118 hours compared to 10/06/2017 FINDINGS: Nasogastric tube coiled in stomach, which is decompressed. Air-filled loops of nonspecific small bowel mid abdomen. No bowel wall thickening. Paucity of colonic gas. Multiple calcified gallstones RIGHT upper  quadrant. Bones demineralized. IMPRESSION: Cholelithiasis. Decompressed stomach versus prior exam. Nonspecific mildly dilated air-filled loops of small bowel in the mid abdomen with paucity of colonic gas, nonspecific, could reflect ileus or even potentially small-bowel obstruction; further imaging as clinically warranted. Electronically Signed   By: Lavonia Dana M.D.   On: 10/08/2017 11:29    DISCHARGE EXAMINATION: See progress note from earlier today.  DISPOSITION: SNF  Discharge Instructions    Call MD for:  extreme fatigue   Complete by:  As directed    Call MD for:  persistant dizziness or light-headedness   Complete by:  As directed    Call MD for:  persistant nausea and vomiting   Complete by:  As directed    Call MD for:  severe uncontrolled pain   Complete by:  As directed    Call MD for:  temperature >100.4   Complete by:  As directed    Discharge instructions   Complete by:  As directed  Please review instructions on the discharge summary.  You were cared for by a hospitalist during your hospital stay. If you have any questions about your discharge medications or the care you received while you were in the hospital after you are discharged, you can call the unit and asked to speak with the hospitalist on call if the hospitalist that took care of you is not available. Once you are discharged, your primary care physician will handle any further medical issues. Please note that NO REFILLS for any discharge medications will be authorized once you are discharged, as it is imperative that you return to your primary care physician (or establish a relationship with a primary care physician if you do not have one) for your aftercare needs so that they can reassess your need for medications and monitor your lab values. If you do not have a primary care physician, you can call [phone] for a physician referral.   Increase activity slowly   Complete by:  As directed         Allergies  as of 10/27/2017      Reactions   Nsaids    Kidney disease      Medication List    STOP taking these medications   apixaban 5 MG Tabs tablet Commonly known as:  ELIQUIS   Capsaicin-Menthol-Methyl Sal 0.025-1-12 % Crea Commonly known as:  capsaicin-methyl sal-menthol   carvedilol 25 MG tablet Commonly known as:  COREG   clotrimazole-betamethasone cream Commonly known as:  LOTRISONE   ferrous sulfate 325 (65 FE) MG tablet   finasteride 5 MG tablet Commonly known as:  PROSCAR   losartan 50 MG tablet Commonly known as:  COZAAR   lovastatin 20 MG tablet Commonly known as:  MEVACOR   methocarbamol 750 MG tablet Commonly known as:  ROBAXIN   Omega-3 1000 MG Caps   potassium chloride SA 20 MEQ tablet Commonly known as:  K-DUR,KLOR-CON   ROCEPHIN 1 g injection Generic drug:  cefTRIAXone   tamsulosin 0.4 MG Caps capsule Commonly known as:  FLOMAX   torsemide 20 MG tablet Commonly known as:  DEMADEX     TAKE these medications   acetaminophen 500 MG tablet Commonly known as:  TYLENOL Take 1,000 mg by mouth every 6 (six) hours as needed (for pain/fever/headaches.).   allopurinol 300 MG tablet Commonly known as:  ZYLOPRIM take 1 tablet by mouth once daily   atorvastatin 10 MG tablet Commonly known as:  LIPITOR Take 1 tablet by mouth daily.   Baclofen 5 MG Tabs Take 5 mg by mouth 3 (three) times daily as needed for muscle spasms.   buPROPion 100 MG tablet Commonly known as:  WELLBUTRIN take 1 tablet by mouth twice a day   feeding supplement (NEPRO CARB STEADY) Liqd Take 237 mLs by mouth 3 (three) times daily between meals.   multivitamin with minerals tablet Take 1 tablet by mouth daily. Men's 50+ Multivitamin   nitroGLYCERIN 0.4 MG SL tablet Commonly known as:  NITROSTAT place 1 tablet under the tongue if needed every 5 minutes for chest pain for 3 doses IF NO RELIEF AFTER 1ST DOSE CALL 911.   oxyCODONE-acetaminophen 5-325 MG tablet Commonly known as:   PERCOCET/ROXICET Take 1 tablet by mouth every 6 (six) hours as needed for severe pain.   pantoprazole 40 MG tablet Commonly known as:  PROTONIX Take 1 tablet (40 mg total) by mouth daily.   traZODone 50 MG tablet Commonly known as:  DESYREL Take 1 tablet (50  mg total) by mouth at bedtime as needed for sleep. What changed:    medication strength  how much to take  when to take this  reasons to take this   warfarin 2 MG tablet Commonly known as:  COUMADIN 29m every evening. Check INR on 6/4. Adjust depending on INR. Goal INR: 2-3.        Contact information for after-discharge care    [email or web address]   Service:  Skilled Nursing Contact information: [phone]             TOTAL DISCHARGE TIME: 310mins  [facility] Pager [phone] 10/27/2017, 2:51 PM

## 2017-10-27 NOTE — Progress Notes (Signed)
[facility] PROGRESS NOTE  Patrick [name] EXN:[id number] DOB: December 13, 1950 DOA: 10/06/2017  PCP: Patient, [name]  Brief History/Interval Summary: 67 yo M SNF resident w/ a Hx CAD, Chronic Systolic CHF (EF 49-67%), remote Colon Cancer w/ perm end colostomy in 2018, CKD Stage III-IV, SBO, and an enlarged prostate who presented to the ED 5/10 after being found w/ altered LOC and several lab abnormalities including cr 13.35 and K 5.39mHgb 7.5, and hypoglycemia. He was also hypotensive w/ SBP in 70s. He initially underwent CRRT. Now getting intermittent dialysis.  Significant Events/Procedures: : 5/10 admit - transfused 1U PRBC - Vascath placed - CRRT initiated 5/13 CRRT stopped  5/11 TTE - diffuse hypokinesis - EF 35-40% - mild mitral regurg 5/16 IHD Patient was transferred to hospitalist service on 10/11/2017  Consultants: Nephrology.  Previously seen by critical care medicine.  Subjective/Interval History: Patient feels well.  Denies any chest pain or shortness of breath.  No nausea or vomiting.  ROS: Denies any headaches.  Objective:  Vital Signs  Vitals:   10/26/17 2303 10/27/17 0149 10/27/17 0309 10/27/17 0750  BP: 122/66  111/62 114/69  Pulse: 91  94 81  Resp: 19  14 (!) 24  Temp: 97.8 F (36.6 C)  98 F (36.7 C) 98.5 F (36.9 C)  TempSrc: Oral  Oral Oral  SpO2: 99%  99% 95%  Weight:  82.6 kg (182 lb 1.6 oz)    Height:        Intake/Output Summary (Last 24 hours) at 10/27/2017 1057 Last data filed at 10/27/2017 0900 Gross per 24 hour  Intake 720 ml  Output 1295 ml  Net -575 ml   Filed Weights   10/25/17 1930 10/26/17 0140 10/27/17 0149  Weight: 81.3 kg (179 lb 3.7 oz) 81.2 kg (179 lb 0.2 oz) 82.6 kg (182 lb 1.6 oz)    General appearance: Awake alert.  In no distress. Resp: Diminished air entry at the bases.  No wheezing rales or rhonchi. Cardio: S1-S2 is irregularly telemetry was reviewed.  Yesterday morning he was tachycardic between 100-120.   Subsequently over the course of the day he improved to 799sand 80s. GI: Abdomen remains soft.  Nontender nondistended.  Ostomy with brown stool. Extremities: Stable edema bilateral lower extremities Neurologic: No obvious focal neurological deficits.  Lab Results:  Data Reviewed: I have personally reviewed following labs and imaging studies  CBC: Recent Labs  Lab 10/21/17 0312 10/22/17 0208 10/23/17 0829 10/24/17 0340 10/25/17 1553  WBC 3.4* 4.5 5.8 5.6 6.0  NEUTROABS 1.9 2.9  --   --   --   HGB 9.1* 9.3* 8.2* 9.3* 9.3*  HCT 29.3* 30.3* 26.1* 30.0* 29.9*  MCV 87.7 87.6 85.9 86.2 86.7  PLT 106* 118* 127* 140* 1591   Basic Metabolic Panel: Recent Labs  Lab 10/23/17 0240 10/24/17 0340 10/25/17 0312 10/26/17 0329 10/27/17 0146  NA 133* 135 136 137 136  K 4.3 3.5 3.3* 2.5* 3.4*  CL 95* 97* 96* 97* 100*  CO2 '23 26 27 28 24  ' GLUCOSE 109* 144* 108* 89 89  BUN 50* 32* 54* 33* 62*  CREATININE 5.74* 3.62* 4.95* 3.35* 4.93*  CALCIUM 8.7* 8.5* 9.0 8.1* 8.4*  PHOS 5.4* 3.3 4.0 2.0* 2.0*    GFR: Estimated Creatinine Clearance: 14.3 mL/min (A) (by C-G formula based on SCr of 4.93 mg/dL (H)).  Liver Function Tests: Recent Labs  Lab 10/23/17 0240 10/24/17 0340 10/25/17 0312 10/26/17 0329 10/27/17 0146  ALBUMIN 2.4*  2.5* 2.4* 2.1* 2.4*     Radiology Studies: No results found.   Medications:  Scheduled: . Chlorhexidine Gluconate Cloth  6 each Topical Q0600  . darbepoetin (ARANESP) injection - DIALYSIS  200 mcg Intravenous Q Mon-HD  . feeding supplement (NEPRO CARB STEADY)  237 mL Oral TID BM  . pantoprazole  40 mg Oral Daily  . sodium chloride flush  10-40 mL Intracatheter Q12H   Continuous:  XYV:OPFYTWKM, oxyCODONE-acetaminophen, traZODone  Assessment/Plan:     Acute on chronic kidney disease stage III versus 4 Presented with a creatinine of 4.4. Baseline creatinine noted to be 1.6. Thought to be secondary to ATN in the setting of septic shock. Nephrology  is following. Patient was initially on CRRT. Patient had a dialysis catheter placed by interventional radiology. He has been intermittently dialyzed.  It appears that nephrology is for outpatient dialysis.  He has been accepted at the [facility] dialysis center starting Tuesday, June 4.  Last dialyzed yesterday.  Will likely be dialyzed tomorrow and then he could potentially go to skilled nursing facility.  Patient remains anuric.  Potassium level has improved.  Severe osteoarthritis involving left wrist Patient had pain and swelling. Doppler ultrasound negative for DVT. X-ray showed osteoarthritis. Uric acid level was low. ESR and CRP mildly elevated. Patient was placed on prednisone for 3 days along with a wrist splint. Feels much better now.  Enterobacter UTI He completed a course of meropenem on 5/18  Anemia of chronic kidney disease Has received 4 units of PRBCs this admission. Last transfused on 5/16. He is on Aranesp weekly. Hemoglobin remains stable.  Thrombocytopenia, acute on chronic Thought to be secondary to sepsis. DIC panel was equivocal. No heparin products. Platelet count continues to improve. SRA results were negative. Not likely to be HIT.  Platelet counts have improved.  We will recheck again tomorrow.  Acute metabolic encephalopathy Secondary to his acute illness. Now resolved.  Hypovolemic septic shock Now resolved.  Paroxysmal atrial fibrillation, bradycardia with frequent pauses Heart rate is reasonably well controlled for the most part.  Telemetry continues to show occasional pauses with brief periods of bradycardia which has been asymptomatic.  In view of this we will hold off on reinitiating any kind of rate limiting drugs.  Patient was not continued on anticoagulation due to thrombocytopenia. Was on apixaban previously.  Platelet counts continue to improve.  We will recheck again tomorrow.  Now that he appears to be heading towards end-stage renal disease may have to  consider warfarin instead of apixaban.     History of colon cancer Status post colostomy in the past. Stable.    DVT Prophylaxis: SCDs    Code Status: Partial Family Communication: Discussed with the patient. Disposition Plan: Management as outlined above.  Hopefully discharge soon to skilled nursing facility.    LOS: 21 days   [facility] Pager [phone] 10/27/2017, 10:57 AM  If 7PM-7AM, please contact night-coverage at [email or web address], password [facility]

## 2017-10-27 NOTE — Clinical Social Work Note (Signed)
CSW facilitated patient discharge including contacting patient family and facility to confirm patient discharge plans. Clinical information faxed to facility and family agreeable with plan. CSW arranged ambulance transport via [facility] to [place] [facility]. RN to call report prior to discharge ([phone]).  CSW will sign off for now as social work intervention is no longer needed. Please consult us again if new needs arise.  Myking Sar, CSW [phone]   

## 2017-10-27 NOTE — Progress Notes (Signed)
Patient sitting up in recliner awaiting [facility] to transport for discharge.  Alert & verbally responsive denies pain or discomfort is patiently waiting to go home.  Watching golf on television without distress.

## 2017-10-27 NOTE — Progress Notes (Signed)
Dialysis treatment completed.  1500 mL ultrafiltrated.  1000 mL net fluid removal.  Patient status unchanged. Lung sounds diminished to ausculation in all fields. Generalized edema. Cardiac: Afib.  Cleansed RIJ catheter with chlorhexidine.  Disconnected lines and flushed ports with saline per protocol.  Ports locked with heparin and capped per protocol.    Report given to bedside, RN Anda Kraft.

## 2017-10-27 NOTE — Procedures (Signed)
Patient was seen on dialysis and the procedure was supervised.  BFR 400  Via PC BP is  101/71.   Patient appears to be tolerating treatment well  Patrick [name] A 10/27/2017

## 2017-10-27 NOTE — Progress Notes (Signed)
[facility] Rounding Note  Background 67 y.o. yo male NHP, wheelchair bound, PMH CAD, HTN, HLD, BPH, CM with EF 35%, RV and LV dysfunction, baseline CKD3 (creatinine 1.48 - 1.78 and on losartan outpt).Admitted 10/06/2017 with creatinine 14, AMS ->urosepsis and AKI (ATN felt 2/2 septic shock). CRRT 5/10-5/13 ([place] cath 5/10) then transitioned to IHD 5/16, dialysis dependent since then.Renal US 5/10 12.1, 12.4 echodense. Of note had chronic indwelling foley since May 2018 due to inability to empty bladder, removed this admission, doing daily bladder scans to monitor for UOP.  Assessment/Plan:  AKI on CKD3 - 2/2 ATN/septic shock from enterobacter urosepsis. CRRT 5/10-5/13, IHD since on MWF schedule. No evidence return of renal function yet however UOP is more than it has been. Currently dialysis dependent & will CLIP for OP HD for AKI- has been accepted at [facility] can start as early as 6/4 1. Indwelling foley since May 2018 (removed this admission) for inability to pass urine on his own however now is passing some which is better than none previously  2. Have been doing HD essentially q MWF- high K bath for hypokalemia    2. Urosepsis- enterobacter- meropenem finished 5/18.  3. Anemia- on ESA- no iron needed. Transfused 5/16. Gets Aranesp 200 QMonday. Last dosed 5/27 Thrombocytopenia - acute on chronic NOT HIT. SRA negative. Heparin allergy removed from records. Can use with HD if needed. OK to block catheter with heparin now 4. PAF/bradycardia - no anticoag at present 2/2 low plts. Tachy this AM 5. HTN/volume- some doughy edema- no BP meds- low BP limiting UF 6. Dispo- ready to go from our standpoint, now has spot.  He probably could go until Tuesday for next treatment but if stays the weekend can do Monday here  Senath A [phone] Pager 10/27/2017, 1:08 PM   Subjective: Interval History:  120 of urine 5/30 and 250 so far today.  Seen on HD Says "not giving up  hope"  Objective: Vital signs in last 24 hours: Temp:  [97.8 F (36.6 C)-99.1 F (37.3 C)] 98.4 F (36.9 C) (05/31 1109) Pulse Rate:  [60-101] 101 (05/31 1300) Resp:  [14-24] 17 (05/31 1109) BP: (91-122)/(57-71) 101/71 (05/31 1300) SpO2:  [95 %-100 %] 95 % (05/31 0750) Weight:  [82.6 kg (182 lb 1.6 oz)] 82.6 kg (182 lb 1.6 oz) (05/31 1109) Weight change: -0.8 kg (-1 lb 12.2 oz)  Intake/Output from previous day: 05/30 0701 - 05/31 0700 In: 480 [P.O.:480] Out: 795 [Urine:120; Stool:675] Intake/Output this shift: Total I/O In: 240 [P.O.:240] Out: 500 [Urine:250; Stool:250]   Very nice older WM NAD VS as noted Lungs grossly clear S1S2 No S3 Tachy irregular Abd with colostomy, bag full 1+ edema LE's TDC R IJ (5/22)  Recent Labs    10/25/17 1553 10/27/17 1028  WBC 6.0 5.3  HGB 9.3* 9.0*  HCT 29.9* 29.0*  PLT 155 158   BMET:  Recent Labs    10/26/17 0329 10/27/17 0146  NA 137 136  K 2.5* 3.4*  CL 97* 100*  CO2 28 24  GLUCOSE 89 89  BUN 33* 62*  CREATININE 3.35* 4.93*  CALCIUM 8.1* 8.4*    Scheduled: . Chlorhexidine Gluconate Cloth  6 each Topical Q0600  . darbepoetin (ARANESP) injection - DIALYSIS  200 mcg Intravenous Q Mon-HD  . feeding supplement (NEPRO CARB STEADY)  237 mL Oral TID BM  . pantoprazole  40 mg Oral Daily  . sodium chloride flush  10-40 mL Intracatheter Q12H

## 2017-10-27 NOTE — Progress Notes (Signed)
Physical Therapy Treatment Patient Details Name: Patrick [name] MRN: [id number] DOB: [birth year] Today's Date: 10/27/2017    History of Present Illness Pt adm with acute metabolic encephalopathy, shock, and acute on chronic kidney failure. Pt intubated 5/10-5/13 and on CRRT 5/10-5/13. PMH - Rectal CA, colostomy, CAD, afib, HTN, heart failure, ckd    PT Comments    Pt c/o increased R knee and L wrist pain today. Pt states that he never removes L wrist brace. Pt educated regarding periodic removal of brace for skin integrity and to perform ROM exercises with L wrist and hand with brace off to avoid weakness and atrophy. Pt able to ambulate 150 ft with RW with decreased gait quality and speed today due to R knee pain. Pt able to perform dual task counting by 3s during ambulation.  Follow Up Recommendations  SNF     Equipment Recommendations  None recommended by PT    Recommendations for Other Services       Precautions / Restrictions Precautions Precautions: Fall Restrictions Weight Bearing Restrictions: No Other Position/Activity Restrictions: Pt wearing L wrist splint for pain    Mobility  Bed Mobility Overal bed mobility: Modified Independent Bed Mobility: Supine to Sit     Supine to sit: HOB elevated     General bed mobility comments: Pt required increased time with supine>sit with HOB elevated  Transfers Overall transfer level: Needs assistance Equipment used: Rolling walker (2 wheeled) Transfers: Sit to/from Stand Sit to Stand: Min guard         General transfer comment: Increased time required to stand fully upright today secondary to R knee pain; min guard for safety  Ambulation/Gait Ambulation/Gait assistance: Min guard Ambulation Distance (Feet): 150 Feet Assistive device: Rolling walker (2 wheeled) Gait Pattern/deviations: Step-through pattern;Decreased stride length;Trunk flexed;Decreased weight shift to right Gait velocity: Decreased   General Gait  Details: Pt ambulates slowly min guard with RW with no fatigue or SOB noted. Pt reports initial R knee pain with WBing on RLE. Pt also reports L wrist pain with use of RW with wrist brace donned. Pt able to have count by 3s without error while walking without further decrease in gait speed   Stairs             Wheelchair Mobility    Modified Rankin (Stroke Patients Only)       Balance Overall balance assessment: Mild deficits observed, not formally tested Sitting-balance support: No upper extremity supported;Feet supported Sitting balance-Leahy Scale: Good       Standing balance-Leahy Scale: Fair                              Cognition Arousal/Alertness: Awake/alert Behavior During Therapy: WFL for tasks assessed/performed Overall Cognitive Status: No family/caregiver present to determine baseline cognitive functioning                                 General Comments: Pt able to count by 3s without error during ambulation to 69 and responded appropriately to questions today. Pt states he remembers ambulating with therapy at previous sessions      Exercises General Exercises - Lower Extremity Long Arc Quad: AROM;Both;10 reps;Seated Hip Flexion/Marching: AROM;10 reps;Both;Seated    General Comments        Pertinent Vitals/Pain Pain Score: 4  Pain Location: R knee, L wrist Pain Descriptors / Indicators: Discomfort;Grimacing Pain Intervention(s):  Limited activity within patient's tolerance;Monitored during session    Home Living                      Prior Function            PT Goals (current goals can now be found in the care plan section) Acute Rehab PT Goals Patient Stated Goal: No goal stated this session Progress towards PT goals: Progressing toward goals    Frequency    Min 2X/week      PT Plan Current plan remains appropriate    Co-evaluation              AM-PAC PT "6 Clicks" Daily Activity   Outcome Measure  Difficulty turning over in bed (including adjusting bedclothes, sheets and blankets)?: None Difficulty moving from lying on back to sitting on the side of the bed? : A Little Difficulty sitting down on and standing up from a chair with arms (e.g., wheelchair, bedside commode, etc,.)?: A Little Help needed moving to and from a bed to chair (including a wheelchair)?: A Little Help needed walking in hospital room?: A Little Help needed climbing 3-5 steps with a railing? : A Little 6 Click Score: 19    End of Session Equipment Utilized During Treatment: Gait belt Activity Tolerance: Patient tolerated treatment well Patient left: in chair;with chair alarm set;with call bell/phone within reach Nurse Communication: Mobility status;Other (comment)(Pt report of always having L wrist brace on; RN notified to be aware of possible skin breakdown) PT Visit Diagnosis: Other abnormalities of gait and mobility (R26.89);Difficulty in walking, not elsewhere classified (R26.2);Muscle weakness (generalized) (M62.81)     Time: 8768-1157 PT Time Calculation (min) (ACUTE ONLY): 27 min  Charges:  $Gait Training: 8-22 mins $Therapeutic Exercise: 8-22 mins                    G Codes:       Gabe Breaker Springer, SPT   [employer] 10/27/2017, 1:51 PM

## 2017-10-30 ENCOUNTER — Other Ambulatory Visit: Payer: Self-pay

## 2017-10-30 NOTE — Patient Outreach (Signed)
[facility]) Care Management  10/30/2017  RENN DIROCCO [birth year] [id number]   Medication Adherence call to Mr. Barbra Sarks spoke with patient wife he is at a long term Facility at this time and they provide with all his medication. Patient is showing under [facility] Ins.past due on Atorvastatin 10 mg and Losartan 50 mg.  [facility] Management Direct Dial [phone]  Fax [phone] [email or web address]

## 2017-10-31 DIAGNOSIS — Z4901 Encounter for fitting and adjustment of extracorporeal dialysis catheter: Secondary | ICD-10-CM | POA: Diagnosis not present

## 2017-10-31 DIAGNOSIS — N2581 Secondary hyperparathyroidism of renal origin: Secondary | ICD-10-CM | POA: Diagnosis not present

## 2017-10-31 DIAGNOSIS — N170 Acute kidney failure with tubular necrosis: Secondary | ICD-10-CM | POA: Diagnosis not present

## 2017-10-31 DIAGNOSIS — D689 Coagulation defect, unspecified: Secondary | ICD-10-CM | POA: Diagnosis not present

## 2017-10-31 DIAGNOSIS — D509 Iron deficiency anemia, unspecified: Secondary | ICD-10-CM | POA: Diagnosis not present

## 2017-10-31 DIAGNOSIS — I480 Paroxysmal atrial fibrillation: Secondary | ICD-10-CM | POA: Diagnosis not present

## 2017-11-02 DIAGNOSIS — Z4901 Encounter for fitting and adjustment of extracorporeal dialysis catheter: Secondary | ICD-10-CM | POA: Diagnosis not present

## 2017-11-02 DIAGNOSIS — N2581 Secondary hyperparathyroidism of renal origin: Secondary | ICD-10-CM | POA: Diagnosis not present

## 2017-11-02 DIAGNOSIS — D689 Coagulation defect, unspecified: Secondary | ICD-10-CM | POA: Diagnosis not present

## 2017-11-02 DIAGNOSIS — D509 Iron deficiency anemia, unspecified: Secondary | ICD-10-CM | POA: Diagnosis not present

## 2017-11-02 DIAGNOSIS — I480 Paroxysmal atrial fibrillation: Secondary | ICD-10-CM | POA: Diagnosis not present

## 2017-11-02 DIAGNOSIS — N170 Acute kidney failure with tubular necrosis: Secondary | ICD-10-CM | POA: Diagnosis not present

## 2017-11-04 DIAGNOSIS — D689 Coagulation defect, unspecified: Secondary | ICD-10-CM | POA: Diagnosis not present

## 2017-11-04 DIAGNOSIS — N170 Acute kidney failure with tubular necrosis: Secondary | ICD-10-CM | POA: Diagnosis not present

## 2017-11-04 DIAGNOSIS — D509 Iron deficiency anemia, unspecified: Secondary | ICD-10-CM | POA: Diagnosis not present

## 2017-11-04 DIAGNOSIS — I480 Paroxysmal atrial fibrillation: Secondary | ICD-10-CM | POA: Diagnosis not present

## 2017-11-04 DIAGNOSIS — Z4901 Encounter for fitting and adjustment of extracorporeal dialysis catheter: Secondary | ICD-10-CM | POA: Diagnosis not present

## 2017-11-04 DIAGNOSIS — N2581 Secondary hyperparathyroidism of renal origin: Secondary | ICD-10-CM | POA: Diagnosis not present

## 2017-11-07 DIAGNOSIS — N2581 Secondary hyperparathyroidism of renal origin: Secondary | ICD-10-CM | POA: Diagnosis not present

## 2017-11-07 DIAGNOSIS — N170 Acute kidney failure with tubular necrosis: Secondary | ICD-10-CM | POA: Diagnosis not present

## 2017-11-07 DIAGNOSIS — I480 Paroxysmal atrial fibrillation: Secondary | ICD-10-CM | POA: Diagnosis not present

## 2017-11-07 DIAGNOSIS — D509 Iron deficiency anemia, unspecified: Secondary | ICD-10-CM | POA: Diagnosis not present

## 2017-11-07 DIAGNOSIS — D689 Coagulation defect, unspecified: Secondary | ICD-10-CM | POA: Diagnosis not present

## 2017-11-07 DIAGNOSIS — Z4901 Encounter for fitting and adjustment of extracorporeal dialysis catheter: Secondary | ICD-10-CM | POA: Diagnosis not present

## 2017-11-08 DIAGNOSIS — C189 Malignant neoplasm of colon, unspecified: Secondary | ICD-10-CM | POA: Diagnosis not present

## 2017-11-08 DIAGNOSIS — A419 Sepsis, unspecified organism: Secondary | ICD-10-CM | POA: Diagnosis not present

## 2017-11-08 DIAGNOSIS — N390 Urinary tract infection, site not specified: Secondary | ICD-10-CM | POA: Diagnosis not present

## 2017-11-08 DIAGNOSIS — I509 Heart failure, unspecified: Secondary | ICD-10-CM | POA: Diagnosis not present

## 2017-11-08 DIAGNOSIS — N186 End stage renal disease: Secondary | ICD-10-CM | POA: Diagnosis not present

## 2017-11-09 DIAGNOSIS — Z4901 Encounter for fitting and adjustment of extracorporeal dialysis catheter: Secondary | ICD-10-CM | POA: Diagnosis not present

## 2017-11-09 DIAGNOSIS — I480 Paroxysmal atrial fibrillation: Secondary | ICD-10-CM | POA: Diagnosis not present

## 2017-11-09 DIAGNOSIS — N170 Acute kidney failure with tubular necrosis: Secondary | ICD-10-CM | POA: Diagnosis not present

## 2017-11-09 DIAGNOSIS — D509 Iron deficiency anemia, unspecified: Secondary | ICD-10-CM | POA: Diagnosis not present

## 2017-11-09 DIAGNOSIS — D689 Coagulation defect, unspecified: Secondary | ICD-10-CM | POA: Diagnosis not present

## 2017-11-09 DIAGNOSIS — N2581 Secondary hyperparathyroidism of renal origin: Secondary | ICD-10-CM | POA: Diagnosis not present

## 2017-11-11 DIAGNOSIS — N2581 Secondary hyperparathyroidism of renal origin: Secondary | ICD-10-CM | POA: Diagnosis not present

## 2017-11-11 DIAGNOSIS — Z4901 Encounter for fitting and adjustment of extracorporeal dialysis catheter: Secondary | ICD-10-CM | POA: Diagnosis not present

## 2017-11-11 DIAGNOSIS — I480 Paroxysmal atrial fibrillation: Secondary | ICD-10-CM | POA: Diagnosis not present

## 2017-11-11 DIAGNOSIS — D509 Iron deficiency anemia, unspecified: Secondary | ICD-10-CM | POA: Diagnosis not present

## 2017-11-11 DIAGNOSIS — N170 Acute kidney failure with tubular necrosis: Secondary | ICD-10-CM | POA: Diagnosis not present

## 2017-11-11 DIAGNOSIS — D689 Coagulation defect, unspecified: Secondary | ICD-10-CM | POA: Diagnosis not present

## 2017-11-14 DIAGNOSIS — D689 Coagulation defect, unspecified: Secondary | ICD-10-CM | POA: Diagnosis not present

## 2017-11-14 DIAGNOSIS — Z4901 Encounter for fitting and adjustment of extracorporeal dialysis catheter: Secondary | ICD-10-CM | POA: Diagnosis not present

## 2017-11-14 DIAGNOSIS — N2581 Secondary hyperparathyroidism of renal origin: Secondary | ICD-10-CM | POA: Diagnosis not present

## 2017-11-14 DIAGNOSIS — D509 Iron deficiency anemia, unspecified: Secondary | ICD-10-CM | POA: Diagnosis not present

## 2017-11-14 DIAGNOSIS — N170 Acute kidney failure with tubular necrosis: Secondary | ICD-10-CM | POA: Diagnosis not present

## 2017-11-14 DIAGNOSIS — I480 Paroxysmal atrial fibrillation: Secondary | ICD-10-CM | POA: Diagnosis not present

## 2017-11-15 ENCOUNTER — Other Ambulatory Visit: Payer: Self-pay

## 2017-11-15 DIAGNOSIS — N186 End stage renal disease: Secondary | ICD-10-CM

## 2017-11-15 DIAGNOSIS — G4700 Insomnia, unspecified: Secondary | ICD-10-CM | POA: Diagnosis not present

## 2017-11-15 DIAGNOSIS — Z992 Dependence on renal dialysis: Principal | ICD-10-CM

## 2017-11-16 DIAGNOSIS — N2581 Secondary hyperparathyroidism of renal origin: Secondary | ICD-10-CM | POA: Diagnosis not present

## 2017-11-16 DIAGNOSIS — I480 Paroxysmal atrial fibrillation: Secondary | ICD-10-CM | POA: Diagnosis not present

## 2017-11-16 DIAGNOSIS — D509 Iron deficiency anemia, unspecified: Secondary | ICD-10-CM | POA: Diagnosis not present

## 2017-11-16 DIAGNOSIS — Z4901 Encounter for fitting and adjustment of extracorporeal dialysis catheter: Secondary | ICD-10-CM | POA: Diagnosis not present

## 2017-11-16 DIAGNOSIS — D689 Coagulation defect, unspecified: Secondary | ICD-10-CM | POA: Diagnosis not present

## 2017-11-16 DIAGNOSIS — N170 Acute kidney failure with tubular necrosis: Secondary | ICD-10-CM | POA: Diagnosis not present

## 2017-11-18 DIAGNOSIS — I480 Paroxysmal atrial fibrillation: Secondary | ICD-10-CM | POA: Diagnosis not present

## 2017-11-18 DIAGNOSIS — D689 Coagulation defect, unspecified: Secondary | ICD-10-CM | POA: Diagnosis not present

## 2017-11-18 DIAGNOSIS — D509 Iron deficiency anemia, unspecified: Secondary | ICD-10-CM | POA: Diagnosis not present

## 2017-11-18 DIAGNOSIS — N2581 Secondary hyperparathyroidism of renal origin: Secondary | ICD-10-CM | POA: Diagnosis not present

## 2017-11-18 DIAGNOSIS — N170 Acute kidney failure with tubular necrosis: Secondary | ICD-10-CM | POA: Diagnosis not present

## 2017-11-18 DIAGNOSIS — Z4901 Encounter for fitting and adjustment of extracorporeal dialysis catheter: Secondary | ICD-10-CM | POA: Diagnosis not present

## 2017-11-21 DIAGNOSIS — N2581 Secondary hyperparathyroidism of renal origin: Secondary | ICD-10-CM | POA: Diagnosis not present

## 2017-11-21 DIAGNOSIS — D509 Iron deficiency anemia, unspecified: Secondary | ICD-10-CM | POA: Diagnosis not present

## 2017-11-21 DIAGNOSIS — R52 Pain, unspecified: Secondary | ICD-10-CM | POA: Diagnosis not present

## 2017-11-21 DIAGNOSIS — D689 Coagulation defect, unspecified: Secondary | ICD-10-CM | POA: Diagnosis not present

## 2017-11-21 DIAGNOSIS — D631 Anemia in chronic kidney disease: Secondary | ICD-10-CM | POA: Diagnosis not present

## 2017-11-21 DIAGNOSIS — N186 End stage renal disease: Secondary | ICD-10-CM | POA: Diagnosis not present

## 2017-11-21 DIAGNOSIS — Z4901 Encounter for fitting and adjustment of extracorporeal dialysis catheter: Secondary | ICD-10-CM | POA: Diagnosis not present

## 2017-11-23 DIAGNOSIS — R296 Repeated falls: Secondary | ICD-10-CM | POA: Diagnosis not present

## 2017-11-23 DIAGNOSIS — R52 Pain, unspecified: Secondary | ICD-10-CM | POA: Diagnosis not present

## 2017-11-23 DIAGNOSIS — D689 Coagulation defect, unspecified: Secondary | ICD-10-CM | POA: Diagnosis not present

## 2017-11-23 DIAGNOSIS — D509 Iron deficiency anemia, unspecified: Secondary | ICD-10-CM | POA: Diagnosis not present

## 2017-11-23 DIAGNOSIS — D631 Anemia in chronic kidney disease: Secondary | ICD-10-CM | POA: Diagnosis not present

## 2017-11-23 DIAGNOSIS — Z4901 Encounter for fitting and adjustment of extracorporeal dialysis catheter: Secondary | ICD-10-CM | POA: Diagnosis not present

## 2017-11-23 DIAGNOSIS — R1314 Dysphagia, pharyngoesophageal phase: Secondary | ICD-10-CM | POA: Diagnosis not present

## 2017-11-23 DIAGNOSIS — N186 End stage renal disease: Secondary | ICD-10-CM | POA: Diagnosis not present

## 2017-11-23 DIAGNOSIS — N2581 Secondary hyperparathyroidism of renal origin: Secondary | ICD-10-CM | POA: Diagnosis not present

## 2017-11-25 DIAGNOSIS — D631 Anemia in chronic kidney disease: Secondary | ICD-10-CM | POA: Diagnosis not present

## 2017-11-25 DIAGNOSIS — N186 End stage renal disease: Secondary | ICD-10-CM | POA: Diagnosis not present

## 2017-11-25 DIAGNOSIS — D689 Coagulation defect, unspecified: Secondary | ICD-10-CM | POA: Diagnosis not present

## 2017-11-25 DIAGNOSIS — R52 Pain, unspecified: Secondary | ICD-10-CM | POA: Diagnosis not present

## 2017-11-25 DIAGNOSIS — Z4901 Encounter for fitting and adjustment of extracorporeal dialysis catheter: Secondary | ICD-10-CM | POA: Diagnosis not present

## 2017-11-25 DIAGNOSIS — D509 Iron deficiency anemia, unspecified: Secondary | ICD-10-CM | POA: Diagnosis not present

## 2017-11-25 DIAGNOSIS — N2581 Secondary hyperparathyroidism of renal origin: Secondary | ICD-10-CM | POA: Diagnosis not present

## 2017-11-26 DIAGNOSIS — Z992 Dependence on renal dialysis: Secondary | ICD-10-CM | POA: Diagnosis not present

## 2017-11-26 DIAGNOSIS — N186 End stage renal disease: Secondary | ICD-10-CM | POA: Diagnosis not present

## 2017-11-26 DIAGNOSIS — N170 Acute kidney failure with tubular necrosis: Secondary | ICD-10-CM | POA: Diagnosis not present

## 2017-11-27 DIAGNOSIS — Z992 Dependence on renal dialysis: Secondary | ICD-10-CM | POA: Diagnosis not present

## 2017-11-27 DIAGNOSIS — N186 End stage renal disease: Secondary | ICD-10-CM | POA: Diagnosis not present

## 2017-11-27 DIAGNOSIS — N179 Acute kidney failure, unspecified: Secondary | ICD-10-CM | POA: Diagnosis not present

## 2017-11-28 DIAGNOSIS — Z4901 Encounter for fitting and adjustment of extracorporeal dialysis catheter: Secondary | ICD-10-CM | POA: Diagnosis not present

## 2017-11-28 DIAGNOSIS — N2581 Secondary hyperparathyroidism of renal origin: Secondary | ICD-10-CM | POA: Diagnosis not present

## 2017-11-28 DIAGNOSIS — D689 Coagulation defect, unspecified: Secondary | ICD-10-CM | POA: Diagnosis not present

## 2017-11-28 DIAGNOSIS — D509 Iron deficiency anemia, unspecified: Secondary | ICD-10-CM | POA: Diagnosis not present

## 2017-11-28 DIAGNOSIS — N186 End stage renal disease: Secondary | ICD-10-CM | POA: Diagnosis not present

## 2017-11-29 DIAGNOSIS — N186 End stage renal disease: Secondary | ICD-10-CM | POA: Diagnosis not present

## 2017-11-29 DIAGNOSIS — G4700 Insomnia, unspecified: Secondary | ICD-10-CM | POA: Diagnosis not present

## 2017-11-29 DIAGNOSIS — I4891 Unspecified atrial fibrillation: Secondary | ICD-10-CM | POA: Diagnosis not present

## 2017-11-29 DIAGNOSIS — I509 Heart failure, unspecified: Secondary | ICD-10-CM | POA: Diagnosis not present

## 2017-11-29 DIAGNOSIS — M109 Gout, unspecified: Secondary | ICD-10-CM | POA: Diagnosis not present

## 2017-11-29 DIAGNOSIS — C189 Malignant neoplasm of colon, unspecified: Secondary | ICD-10-CM | POA: Diagnosis not present

## 2017-11-30 DIAGNOSIS — N2581 Secondary hyperparathyroidism of renal origin: Secondary | ICD-10-CM | POA: Diagnosis not present

## 2017-11-30 DIAGNOSIS — Z4901 Encounter for fitting and adjustment of extracorporeal dialysis catheter: Secondary | ICD-10-CM | POA: Diagnosis not present

## 2017-11-30 DIAGNOSIS — D509 Iron deficiency anemia, unspecified: Secondary | ICD-10-CM | POA: Diagnosis not present

## 2017-11-30 DIAGNOSIS — N186 End stage renal disease: Secondary | ICD-10-CM | POA: Diagnosis not present

## 2017-11-30 DIAGNOSIS — D689 Coagulation defect, unspecified: Secondary | ICD-10-CM | POA: Diagnosis not present

## 2017-12-02 DIAGNOSIS — N2581 Secondary hyperparathyroidism of renal origin: Secondary | ICD-10-CM | POA: Diagnosis not present

## 2017-12-02 DIAGNOSIS — N186 End stage renal disease: Secondary | ICD-10-CM | POA: Diagnosis not present

## 2017-12-02 DIAGNOSIS — D689 Coagulation defect, unspecified: Secondary | ICD-10-CM | POA: Diagnosis not present

## 2017-12-02 DIAGNOSIS — D509 Iron deficiency anemia, unspecified: Secondary | ICD-10-CM | POA: Diagnosis not present

## 2017-12-02 DIAGNOSIS — Z4901 Encounter for fitting and adjustment of extracorporeal dialysis catheter: Secondary | ICD-10-CM | POA: Diagnosis not present

## 2017-12-05 DIAGNOSIS — D509 Iron deficiency anemia, unspecified: Secondary | ICD-10-CM | POA: Diagnosis not present

## 2017-12-05 DIAGNOSIS — M6281 Muscle weakness (generalized): Secondary | ICD-10-CM | POA: Diagnosis not present

## 2017-12-05 DIAGNOSIS — N186 End stage renal disease: Secondary | ICD-10-CM | POA: Diagnosis not present

## 2017-12-05 DIAGNOSIS — N2581 Secondary hyperparathyroidism of renal origin: Secondary | ICD-10-CM | POA: Diagnosis not present

## 2017-12-05 DIAGNOSIS — D689 Coagulation defect, unspecified: Secondary | ICD-10-CM | POA: Diagnosis not present

## 2017-12-05 DIAGNOSIS — Z4901 Encounter for fitting and adjustment of extracorporeal dialysis catheter: Secondary | ICD-10-CM | POA: Diagnosis not present

## 2017-12-06 DIAGNOSIS — M6281 Muscle weakness (generalized): Secondary | ICD-10-CM | POA: Diagnosis not present

## 2017-12-07 ENCOUNTER — Emergency Department (HOSPITAL_COMMUNITY): Payer: Medicare Other

## 2017-12-07 ENCOUNTER — Inpatient Hospital Stay (HOSPITAL_COMMUNITY): Payer: Medicare Other

## 2017-12-07 ENCOUNTER — Inpatient Hospital Stay (HOSPITAL_COMMUNITY)
Admission: EM | Admit: 2017-12-07 | Discharge: 2017-12-17 | DRG: 286 | Disposition: A | Discharge status: Skilled Nursing Facility | Payer: Medicare Other | Source: Skilled Nursing Facility | Attending: Family Medicine | Admitting: Family Medicine

## 2017-12-07 ENCOUNTER — Other Ambulatory Visit: Payer: Self-pay

## 2017-12-07 ENCOUNTER — Encounter (HOSPITAL_COMMUNITY): Payer: Self-pay | Admitting: *Deleted

## 2017-12-07 DIAGNOSIS — M1711 Unilateral primary osteoarthritis, right knee: Secondary | ICD-10-CM | POA: Diagnosis present

## 2017-12-07 DIAGNOSIS — I429 Cardiomyopathy, unspecified: Secondary | ICD-10-CM | POA: Diagnosis not present

## 2017-12-07 DIAGNOSIS — I428 Other cardiomyopathies: Secondary | ICD-10-CM | POA: Diagnosis present

## 2017-12-07 DIAGNOSIS — D6959 Other secondary thrombocytopenia: Secondary | ICD-10-CM | POA: Diagnosis not present

## 2017-12-07 DIAGNOSIS — N186 End stage renal disease: Secondary | ICD-10-CM | POA: Diagnosis not present

## 2017-12-07 DIAGNOSIS — I255 Ischemic cardiomyopathy: Secondary | ICD-10-CM | POA: Diagnosis not present

## 2017-12-07 DIAGNOSIS — Z992 Dependence on renal dialysis: Secondary | ICD-10-CM | POA: Diagnosis not present

## 2017-12-07 DIAGNOSIS — M2550 Pain in unspecified joint: Secondary | ICD-10-CM | POA: Diagnosis not present

## 2017-12-07 DIAGNOSIS — N183 Chronic kidney disease, stage 3 (moderate): Secondary | ICD-10-CM | POA: Diagnosis not present

## 2017-12-07 DIAGNOSIS — I132 Hypertensive heart and chronic kidney disease with heart failure and with stage 5 chronic kidney disease, or end stage renal disease: Secondary | ICD-10-CM | POA: Diagnosis not present

## 2017-12-07 DIAGNOSIS — Z7901 Long term (current) use of anticoagulants: Secondary | ICD-10-CM

## 2017-12-07 DIAGNOSIS — R2681 Unsteadiness on feet: Secondary | ICD-10-CM | POA: Diagnosis not present

## 2017-12-07 DIAGNOSIS — E872 Acidosis: Secondary | ICD-10-CM | POA: Diagnosis not present

## 2017-12-07 DIAGNOSIS — R001 Bradycardia, unspecified: Secondary | ICD-10-CM | POA: Diagnosis present

## 2017-12-07 DIAGNOSIS — N2581 Secondary hyperparathyroidism of renal origin: Secondary | ICD-10-CM | POA: Diagnosis not present

## 2017-12-07 DIAGNOSIS — G9341 Metabolic encephalopathy: Secondary | ICD-10-CM | POA: Diagnosis not present

## 2017-12-07 DIAGNOSIS — I481 Persistent atrial fibrillation: Secondary | ICD-10-CM | POA: Diagnosis present

## 2017-12-07 DIAGNOSIS — I5042 Chronic combined systolic (congestive) and diastolic (congestive) heart failure: Secondary | ICD-10-CM | POA: Diagnosis present

## 2017-12-07 DIAGNOSIS — R2689 Other abnormalities of gait and mobility: Secondary | ICD-10-CM | POA: Diagnosis not present

## 2017-12-07 DIAGNOSIS — E11649 Type 2 diabetes mellitus with hypoglycemia without coma: Secondary | ICD-10-CM | POA: Diagnosis not present

## 2017-12-07 DIAGNOSIS — J9601 Acute respiratory failure with hypoxia: Secondary | ICD-10-CM

## 2017-12-07 DIAGNOSIS — Z886 Allergy status to analgesic agent status: Secondary | ICD-10-CM

## 2017-12-07 DIAGNOSIS — I2510 Atherosclerotic heart disease of native coronary artery without angina pectoris: Secondary | ICD-10-CM | POA: Diagnosis not present

## 2017-12-07 DIAGNOSIS — R0602 Shortness of breath: Secondary | ICD-10-CM

## 2017-12-07 DIAGNOSIS — Z7401 Bed confinement status: Secondary | ICD-10-CM | POA: Diagnosis not present

## 2017-12-07 DIAGNOSIS — I499 Cardiac arrhythmia, unspecified: Secondary | ICD-10-CM | POA: Diagnosis not present

## 2017-12-07 DIAGNOSIS — M109 Gout, unspecified: Secondary | ICD-10-CM | POA: Diagnosis present

## 2017-12-07 DIAGNOSIS — E1122 Type 2 diabetes mellitus with diabetic chronic kidney disease: Secondary | ICD-10-CM | POA: Diagnosis not present

## 2017-12-07 DIAGNOSIS — Z6827 Body mass index (BMI) 27.0-27.9, adult: Secondary | ICD-10-CM

## 2017-12-07 DIAGNOSIS — I2582 Chronic total occlusion of coronary artery: Secondary | ICD-10-CM | POA: Diagnosis present

## 2017-12-07 DIAGNOSIS — Z833 Family history of diabetes mellitus: Secondary | ICD-10-CM

## 2017-12-07 DIAGNOSIS — R404 Transient alteration of awareness: Secondary | ICD-10-CM | POA: Diagnosis not present

## 2017-12-07 DIAGNOSIS — E785 Hyperlipidemia, unspecified: Secondary | ICD-10-CM | POA: Diagnosis not present

## 2017-12-07 DIAGNOSIS — Z79899 Other long term (current) drug therapy: Secondary | ICD-10-CM

## 2017-12-07 DIAGNOSIS — M6281 Muscle weakness (generalized): Secondary | ICD-10-CM | POA: Diagnosis not present

## 2017-12-07 DIAGNOSIS — Z933 Colostomy status: Secondary | ICD-10-CM

## 2017-12-07 DIAGNOSIS — F329 Major depressive disorder, single episode, unspecified: Secondary | ICD-10-CM | POA: Diagnosis present

## 2017-12-07 DIAGNOSIS — R092 Respiratory arrest: Secondary | ICD-10-CM

## 2017-12-07 DIAGNOSIS — I5043 Acute on chronic combined systolic (congestive) and diastolic (congestive) heart failure: Secondary | ICD-10-CM | POA: Diagnosis not present

## 2017-12-07 DIAGNOSIS — I4510 Unspecified right bundle-branch block: Secondary | ICD-10-CM | POA: Diagnosis not present

## 2017-12-07 DIAGNOSIS — M7989 Other specified soft tissue disorders: Secondary | ICD-10-CM | POA: Diagnosis not present

## 2017-12-07 DIAGNOSIS — I453 Trifascicular block: Secondary | ICD-10-CM | POA: Diagnosis not present

## 2017-12-07 DIAGNOSIS — I214 Non-ST elevation (NSTEMI) myocardial infarction: Secondary | ICD-10-CM | POA: Diagnosis not present

## 2017-12-07 DIAGNOSIS — R4182 Altered mental status, unspecified: Secondary | ICD-10-CM | POA: Diagnosis present

## 2017-12-07 DIAGNOSIS — I472 Ventricular tachycardia, unspecified: Secondary | ICD-10-CM

## 2017-12-07 DIAGNOSIS — Z85528 Personal history of other malignant neoplasm of kidney: Secondary | ICD-10-CM

## 2017-12-07 DIAGNOSIS — R1314 Dysphagia, pharyngoesophageal phase: Secondary | ICD-10-CM | POA: Diagnosis not present

## 2017-12-07 DIAGNOSIS — I480 Paroxysmal atrial fibrillation: Secondary | ICD-10-CM | POA: Diagnosis not present

## 2017-12-07 DIAGNOSIS — R488 Other symbolic dysfunctions: Secondary | ICD-10-CM | POA: Diagnosis not present

## 2017-12-07 DIAGNOSIS — J9690 Respiratory failure, unspecified, unspecified whether with hypoxia or hypercapnia: Secondary | ICD-10-CM | POA: Diagnosis not present

## 2017-12-07 DIAGNOSIS — I2583 Coronary atherosclerosis due to lipid rich plaque: Secondary | ICD-10-CM | POA: Diagnosis not present

## 2017-12-07 DIAGNOSIS — Z79890 Hormone replacement therapy (postmenopausal): Secondary | ICD-10-CM

## 2017-12-07 DIAGNOSIS — E440 Moderate protein-calorie malnutrition: Secondary | ICD-10-CM | POA: Diagnosis not present

## 2017-12-07 DIAGNOSIS — R0902 Hypoxemia: Secondary | ICD-10-CM | POA: Diagnosis not present

## 2017-12-07 DIAGNOSIS — I25119 Atherosclerotic heart disease of native coronary artery with unspecified angina pectoris: Secondary | ICD-10-CM | POA: Diagnosis not present

## 2017-12-07 DIAGNOSIS — R293 Abnormal posture: Secondary | ICD-10-CM | POA: Diagnosis not present

## 2017-12-07 DIAGNOSIS — I361 Nonrheumatic tricuspid (valve) insufficiency: Secondary | ICD-10-CM | POA: Diagnosis not present

## 2017-12-07 DIAGNOSIS — R55 Syncope and collapse: Secondary | ICD-10-CM | POA: Diagnosis not present

## 2017-12-07 DIAGNOSIS — Z85048 Personal history of other malignant neoplasm of rectum, rectosigmoid junction, and anus: Secondary | ICD-10-CM

## 2017-12-07 DIAGNOSIS — Z87891 Personal history of nicotine dependence: Secondary | ICD-10-CM

## 2017-12-07 DIAGNOSIS — D631 Anemia in chronic kidney disease: Secondary | ICD-10-CM | POA: Diagnosis present

## 2017-12-07 DIAGNOSIS — Z4682 Encounter for fitting and adjustment of non-vascular catheter: Secondary | ICD-10-CM | POA: Diagnosis not present

## 2017-12-07 DIAGNOSIS — M19032 Primary osteoarthritis, left wrist: Secondary | ICD-10-CM | POA: Diagnosis not present

## 2017-12-07 DIAGNOSIS — E119 Type 2 diabetes mellitus without complications: Secondary | ICD-10-CM | POA: Diagnosis not present

## 2017-12-07 DIAGNOSIS — I509 Heart failure, unspecified: Secondary | ICD-10-CM | POA: Diagnosis not present

## 2017-12-07 DIAGNOSIS — Z8249 Family history of ischemic heart disease and other diseases of the circulatory system: Secondary | ICD-10-CM

## 2017-12-07 DIAGNOSIS — R278 Other lack of coordination: Secondary | ICD-10-CM | POA: Diagnosis not present

## 2017-12-07 HISTORY — DX: Chronic systolic (congestive) heart failure: I50.22

## 2017-12-07 HISTORY — DX: Chronic kidney disease, stage 3 unspecified: N18.30

## 2017-12-07 HISTORY — DX: Dyspnea, unspecified: R06.00

## 2017-12-07 HISTORY — DX: Chronic kidney disease, stage 3 (moderate): N18.3

## 2017-12-07 LAB — COMPREHENSIVE METABOLIC PANEL
ALT: 25 U/L (ref 0–44)
AST: 67 U/L — ABNORMAL HIGH (ref 15–41)
Albumin: 2.3 g/dL — ABNORMAL LOW (ref 3.5–5.0)
Alkaline Phosphatase: 166 U/L — ABNORMAL HIGH (ref 38–126)
Anion gap: 19 — ABNORMAL HIGH (ref 5–15)
BILIRUBIN TOTAL: 1.2 mg/dL (ref 0.3–1.2)
BUN: 53 mg/dL — AB (ref 8–23)
CO2: 16 mmol/L — ABNORMAL LOW (ref 22–32)
Calcium: 8.1 mg/dL — ABNORMAL LOW (ref 8.9–10.3)
Chloride: 104 mmol/L (ref 98–111)
Creatinine, Ser: 5 mg/dL — ABNORMAL HIGH (ref 0.61–1.24)
GFR calc Af Amer: 12 mL/min — ABNORMAL LOW (ref >60)
GFR calc non Af Amer: 11 mL/min — ABNORMAL LOW (ref >60)
GLUCOSE: 156 mg/dL — AB (ref 70–99)
POTASSIUM: 4.6 mmol/L (ref 3.5–5.1)
SODIUM: 139 mmol/L (ref 135–145)
TOTAL PROTEIN: 6.4 g/dL — AB (ref 6.5–8.1)

## 2017-12-07 LAB — I-STAT CHEM 8, ED
BUN: 62 mg/dL — AB (ref 8–23)
CALCIUM ION: 1.01 mmol/L — AB (ref 1.15–1.40)
CHLORIDE: 104 mmol/L (ref 98–111)
CREATININE: 4.8 mg/dL — AB (ref 0.61–1.24)
Glucose, Bld: 149 mg/dL — ABNORMAL HIGH (ref 70–99)
HEMATOCRIT: 32 % — AB (ref 39.0–52.0)
Hemoglobin: 10.9 g/dL — ABNORMAL LOW (ref 13.0–17.0)
Potassium: 4.4 mmol/L (ref 3.5–5.1)
Sodium: 140 mmol/L (ref 135–145)
TCO2: 17 mmol/L — ABNORMAL LOW (ref 22–32)

## 2017-12-07 LAB — CBC WITH DIFFERENTIAL/PLATELET
Abs Immature Granulocytes: 0.1 10*3/uL (ref 0.0–0.1)
BASOS ABS: 0 10*3/uL (ref 0.0–0.1)
BASOS PCT: 1 %
EOS ABS: 0 10*3/uL (ref 0.0–0.7)
Eosinophils Relative: 0 %
HCT: 32.1 % — ABNORMAL LOW (ref 39.0–52.0)
Hemoglobin: 9.4 g/dL — ABNORMAL LOW (ref 13.0–17.0)
Immature Granulocytes: 1 %
Lymphocytes Relative: 9 %
Lymphs Abs: 0.6 10*3/uL — ABNORMAL LOW (ref 0.7–4.0)
MCH: 26.3 pg (ref 26.0–34.0)
MCHC: 29.3 g/dL — ABNORMAL LOW (ref 30.0–36.0)
MCV: 89.9 fL (ref 78.0–100.0)
MONO ABS: 0.7 10*3/uL (ref 0.1–1.0)
Monocytes Relative: 12 %
NEUTROS ABS: 4.7 10*3/uL (ref 1.7–7.7)
NEUTROS PCT: 77 %
PLATELETS: 157 10*3/uL (ref 150–400)
RBC: 3.57 MIL/uL — ABNORMAL LOW (ref 4.22–5.81)
RDW: 19.4 % — AB (ref 11.5–15.5)
WBC: 6.1 10*3/uL (ref 4.0–10.5)

## 2017-12-07 LAB — TROPONIN I
Troponin I: 0.65 ng/mL (ref <0.03)
Troponin I: 1.71 ng/mL (ref <0.03)
Troponin I: 1.91 ng/mL (ref <0.03)

## 2017-12-07 LAB — I-STAT ARTERIAL BLOOD GAS, ED
ACID-BASE DEFICIT: 2 mmol/L (ref 0.0–2.0)
Bicarbonate: 23.7 mmol/L (ref 20.0–28.0)
O2 SAT: 100 %
PCO2 ART: 39.5 mmHg (ref 32.0–48.0)
Patient temperature: 96.4
TCO2: 25 mmol/L (ref 22–32)
pH, Arterial: 7.38 (ref 7.350–7.450)
pO2, Arterial: 473 mmHg — ABNORMAL HIGH (ref 83.0–108.0)

## 2017-12-07 LAB — URINALYSIS, ROUTINE W REFLEX MICROSCOPIC
BILIRUBIN URINE: NEGATIVE
GLUCOSE, UA: NEGATIVE mg/dL
Ketones, ur: NEGATIVE mg/dL
NITRITE: NEGATIVE
PH: 5 (ref 5.0–8.0)
Protein, ur: 100 mg/dL — AB
Specific Gravity, Urine: 1.014 (ref 1.005–1.030)
WBC, UA: >50 WBC/hpf — ABNORMAL HIGH (ref 0–5)

## 2017-12-07 LAB — GLUCOSE, CAPILLARY
GLUCOSE-CAPILLARY: 85 mg/dL (ref 70–99)
Glucose-Capillary: 62 mg/dL — ABNORMAL LOW (ref 70–99)
Glucose-Capillary: 72 mg/dL (ref 70–99)
Glucose-Capillary: 75 mg/dL (ref 70–99)

## 2017-12-07 LAB — BASIC METABOLIC PANEL
ANION GAP: 19 — AB (ref 5–15)
BUN: 54 mg/dL — AB (ref 8–23)
CALCIUM: 8.3 mg/dL — AB (ref 8.9–10.3)
CO2: 20 mmol/L — ABNORMAL LOW (ref 22–32)
Chloride: 101 mmol/L (ref 98–111)
Creatinine, Ser: 5.37 mg/dL — ABNORMAL HIGH (ref 0.61–1.24)
GFR calc Af Amer: 11 mL/min — ABNORMAL LOW (ref >60)
GFR calc non Af Amer: 10 mL/min — ABNORMAL LOW (ref >60)
GLUCOSE: 92 mg/dL (ref 70–99)
POTASSIUM: 3.6 mmol/L (ref 3.5–5.1)
Sodium: 140 mmol/L (ref 135–145)

## 2017-12-07 LAB — I-STAT TROPONIN, ED: Troponin i, poc: 0.1 ng/mL (ref 0.00–0.08)

## 2017-12-07 LAB — LACTIC ACID, PLASMA
Lactic Acid, Venous: 2.9 mmol/L (ref 0.5–1.9)
Lactic Acid, Venous: 1.4 mmol/L (ref 0.5–1.9)

## 2017-12-07 LAB — I-STAT CG4 LACTIC ACID, ED: Lactic Acid, Venous: 8.06 mmol/L (ref 0.5–1.9)

## 2017-12-07 LAB — MAGNESIUM
Magnesium: 1.8 mg/dL (ref 1.7–2.4)
Magnesium: 1.9 mg/dL (ref 1.7–2.4)

## 2017-12-07 LAB — CBC
HEMATOCRIT: 29 % — AB (ref 39.0–52.0)
Hemoglobin: 8.7 g/dL — ABNORMAL LOW (ref 13.0–17.0)
MCH: 26.7 pg (ref 26.0–34.0)
MCHC: 30 g/dL (ref 30.0–36.0)
MCV: 89 fL (ref 78.0–100.0)
PLATELETS: 124 10*3/uL — AB (ref 150–400)
RBC: 3.26 MIL/uL — ABNORMAL LOW (ref 4.22–5.81)
RDW: 19.1 % — AB (ref 11.5–15.5)
WBC: 6.6 10*3/uL (ref 4.0–10.5)

## 2017-12-07 LAB — PROTIME-INR
INR: 3.02
Prothrombin Time: 31 seconds — ABNORMAL HIGH (ref 11.4–15.2)

## 2017-12-07 LAB — PHOSPHORUS: Phosphorus: 5.2 mg/dL — ABNORMAL HIGH (ref 2.5–4.6)

## 2017-12-07 LAB — PROCALCITONIN: Procalcitonin: 0.67 ng/mL

## 2017-12-07 LAB — MRSA PCR SCREENING: MRSA by PCR: POSITIVE — AB

## 2017-12-07 MED ORDER — HEPARIN SODIUM (PORCINE) 1000 UNIT/ML DIALYSIS
1000.0000 [IU] | INTRAMUSCULAR | Status: DC | PRN
  Administered 2017-12-07: 1900 [IU] via INTRAVENOUS_CENTRAL

## 2017-12-07 MED ORDER — ACETAMINOPHEN 650 MG RE SUPP: 650.0000 mg | Freq: Four times a day (QID) | RECTAL | Status: DC | PRN

## 2017-12-07 MED ORDER — MIDAZOLAM HCL 2 MG/2ML IJ SOLN
1.0000 mg | INTRAMUSCULAR | Status: DC | PRN
Start: 2017-12-07 — End: 2017-12-07

## 2017-12-07 MED ORDER — ACETAMINOPHEN 160 MG/5ML PO SOLN: 650.0000 mg | Freq: Four times a day (QID) | ORAL | Status: DC | PRN

## 2017-12-07 MED ORDER — AMIODARONE HCL IN DEXTROSE 360-4.14 MG/200ML-% IV SOLN
60.0000 mg/h | INTRAVENOUS | Status: AC
  Administered 2017-12-07: 60 mg/h via INTRAVENOUS
  Filled 2017-12-07: qty 200

## 2017-12-07 MED ORDER — MUPIROCIN 2 % EX OINT
1.0000 "application " | TOPICAL_OINTMENT | Freq: Two times a day (BID) | CUTANEOUS | Status: AC
  Administered 2017-12-07 – 2017-12-11 (×9): 1 via NASAL
  Filled 2017-12-07 (×4): qty 22

## 2017-12-07 MED ORDER — ETOMIDATE 2 MG/ML IV SOLN
INTRAVENOUS | Status: AC | PRN
  Administered 2017-12-07: 20 mg via INTRAVENOUS

## 2017-12-07 MED ORDER — CHLORHEXIDINE GLUCONATE CLOTH 2 % EX PADS: 6.0000 | MEDICATED_PAD | Freq: Every day | CUTANEOUS | Status: DC

## 2017-12-07 MED ORDER — INSULIN ASPART 100 UNIT/ML ~~LOC~~ SOLN
2.0000 [IU] | SUBCUTANEOUS | Status: DC
  Administered 2017-12-10: 2 [IU] via SUBCUTANEOUS

## 2017-12-07 MED ORDER — FENTANYL CITRATE (PF) 100 MCG/2ML IJ SOLN: 50.0000 ug | Freq: Once | INTRAMUSCULAR | Status: DC

## 2017-12-07 MED ORDER — LIDOCAINE HCL URETHRAL/MUCOSAL 2 % EX GEL
1.0000 "application " | Freq: Once | CUTANEOUS | Status: AC
  Administered 2017-12-07: 1 via URETHRAL
  Filled 2017-12-07: qty 5

## 2017-12-07 MED ORDER — FENTANYL 2500MCG IN NS 250ML (10MCG/ML) PREMIX INFUSION: 25.0000 ug/h | INTRAVENOUS | Status: DC

## 2017-12-07 MED ORDER — CHLORHEXIDINE GLUCONATE 0.12% ORAL RINSE (MEDLINE KIT)
15.0000 mL | Freq: Two times a day (BID) | OROMUCOSAL | Status: DC
  Administered 2017-12-07 – 2017-12-08 (×3): 15 mL via OROMUCOSAL

## 2017-12-07 MED ORDER — PENTAFLUOROPROP-TETRAFLUOROETH EX AERO: 1.0000 "application " | INHALATION_SPRAY | CUTANEOUS | Status: DC | PRN

## 2017-12-07 MED ORDER — DEXTROSE 50 % IV SOLN
INTRAVENOUS | Status: AC
  Administered 2017-12-07: 25 mL via INTRAVENOUS
  Filled 2017-12-07: qty 50

## 2017-12-07 MED ORDER — MIDAZOLAM HCL 2 MG/2ML IJ SOLN: 1.0000 mg | INTRAMUSCULAR | Status: DC | PRN

## 2017-12-07 MED ORDER — DEXTROSE-NACL 5-0.9 % IV SOLN
INTRAVENOUS | Status: DC
  Administered 2017-12-07: 18:00:00 via INTRAVENOUS

## 2017-12-07 MED ORDER — DARBEPOETIN ALFA 60 MCG/0.3ML IJ SOSY
60.0000 ug | PREFILLED_SYRINGE | INTRAMUSCULAR | Status: DC
  Administered 2017-12-07 – 2017-12-14 (×2): 60 ug via INTRAVENOUS
  Filled 2017-12-07 (×2): qty 0.3

## 2017-12-07 MED ORDER — AMIODARONE LOAD VIA INFUSION
150.0000 mg | Freq: Once | INTRAVENOUS | Status: AC
  Administered 2017-12-07: 150 mg via INTRAVENOUS
  Filled 2017-12-07: qty 83.34

## 2017-12-07 MED ORDER — FAMOTIDINE 40 MG/5ML PO SUSR
20.0000 mg | Freq: Two times a day (BID) | ORAL | Status: DC
  Administered 2017-12-07 (×2): 20 mg
  Filled 2017-12-07: qty 2.5

## 2017-12-07 MED ORDER — HEPARIN SODIUM (PORCINE) 1000 UNIT/ML DIALYSIS
2700.0000 [IU] | Freq: Once | INTRAMUSCULAR | Status: AC
  Administered 2017-12-07: 2700 [IU] via INTRAVENOUS_CENTRAL

## 2017-12-07 MED ORDER — AMIODARONE HCL IN DEXTROSE 360-4.14 MG/200ML-% IV SOLN
30.0000 mg/h | INTRAVENOUS | Status: DC
  Administered 2017-12-07 – 2017-12-08 (×2): 30 mg/h via INTRAVENOUS
  Filled 2017-12-07 (×4): qty 200

## 2017-12-07 MED ORDER — MAGNESIUM SULFATE 2 GM/50ML IV SOLN
2.0000 g | Freq: Once | INTRAVENOUS | Status: AC
  Administered 2017-12-07: 2 g via INTRAVENOUS
  Filled 2017-12-07: qty 50

## 2017-12-07 MED ORDER — ORAL CARE MOUTH RINSE
15.0000 mL | OROMUCOSAL | Status: DC
  Administered 2017-12-07 – 2017-12-08 (×10): 15 mL via OROMUCOSAL

## 2017-12-07 MED ORDER — SODIUM CHLORIDE 0.9 % IV SOLN: 100.0000 mL | INTRAVENOUS | Status: DC | PRN

## 2017-12-07 MED ORDER — CHLORHEXIDINE GLUCONATE CLOTH 2 % EX PADS
6.0000 | MEDICATED_PAD | Freq: Every day | CUTANEOUS | Status: AC
  Administered 2017-12-10: 6 via TOPICAL

## 2017-12-07 MED ORDER — SODIUM CHLORIDE 0.9 % IV SOLN
250.0000 mL | INTRAVENOUS | Status: DC | PRN
  Administered 2017-12-07 (×2): 250 mL via INTRAVENOUS

## 2017-12-07 MED ORDER — DOCUSATE SODIUM 50 MG/5ML PO LIQD
100.0000 mg | Freq: Two times a day (BID) | ORAL | Status: DC | PRN
  Filled 2017-12-07: qty 10

## 2017-12-07 MED ORDER — FENTANYL BOLUS VIA INFUSION
25.0000 ug | INTRAVENOUS | Status: DC | PRN
  Administered 2017-12-07: 25 ug via INTRAVENOUS
  Filled 2017-12-07: qty 25

## 2017-12-07 MED ORDER — DEXTROSE 50 % IV SOLN
25.0000 mL | INTRAVENOUS | Status: AC
  Administered 2017-12-07: 25 mL via INTRAVENOUS

## 2017-12-07 MED ORDER — DARBEPOETIN ALFA 60 MCG/0.3ML IJ SOSY
PREFILLED_SYRINGE | INTRAMUSCULAR | Status: AC
  Filled 2017-12-07: qty 0.3

## 2017-12-07 MED ORDER — SUCCINYLCHOLINE CHLORIDE 20 MG/ML IJ SOLN
INTRAMUSCULAR | Status: AC | PRN
  Administered 2017-12-07: 100 mg via INTRAVENOUS

## 2017-12-07 MED ORDER — FENTANYL CITRATE (PF) 100 MCG/2ML IJ SOLN
100.0000 ug | Freq: Once | INTRAMUSCULAR | Status: AC
  Administered 2017-12-07: 100 ug via INTRAVENOUS
  Filled 2017-12-07: qty 2

## 2017-12-07 MED ORDER — FENTANYL 2500MCG IN NS 250ML (10MCG/ML) PREMIX INFUSION
0.0000 ug/h | INTRAVENOUS | Status: DC
  Administered 2017-12-07 (×2): 25 ug/h via INTRAVENOUS
  Filled 2017-12-07: qty 250

## 2017-12-07 MED ORDER — LIDOCAINE-PRILOCAINE 2.5-2.5 % EX CREA
1.0000 "application " | TOPICAL_CREAM | CUTANEOUS | Status: DC | PRN
  Filled 2017-12-07: qty 5

## 2017-12-07 NOTE — ED Notes (Signed)
Number og by Lucina Mellow

## 2017-12-07 NOTE — H&P (Addendum)
PULMONARY / CRITICAL CARE MEDICINE   Name: Patrick [name] MRN: [id number] DOB: Mar 07, 1951    ADMISSION DATE:  12/07/2017 CONSULTATION DATE: 12/07/17  REFERRING MD: Dr Betsey Holiday  CHIEF COMPLAINT: VT  HISTORY OF PRESENT ILLNESS:   [phone] with CHF (EF 30%), CAD, HTN, DM, OA, ESRD on HD (started HD on 10/09/17), Colon cancer s/p colostomy (2018), and Afib (on coumadin), presents to the ER following VT. He lives in a nursing home, where tonight he was found down on the floor beside his bed. Initially he was unresponsive but then woke up somewhat and c/o SOB. EMS was called and on moving him from the floor to the stretcher, patient developed VT. He was given Adenosine 6mg ,12mg ,12mg . Patient then became unresponsive and hypotensive and was defibrillated. He was then transferred to the ER where he was emergently intubated. At time of my exam patient is intubated but awake and calm. He does not remember any of the incident. He denies any CP or pain anywhere else. He has 1+ LLE edema but he says this is chronic. Discussed code status with him, and he nods that he is full code and would want defibrillation and cpr if needed.   PAST MEDICAL HISTORY :  He  has a past medical history of Arthritis, CAD (coronary artery disease), CHF (congestive heart failure) ([place]), Coronary artery disease involving native coronary artery of native heart with angina pectoris ([place]) (09/11/2016), Degenerative joint disease of knee, right, Dysrhythmia, Enlarged prostate, Hyperlipidemia, Hypertension, Pneumonia (05/2014), Prediabetes (10/03/2014), Scrotal edema (04/03/2015), and SMALL BOWEL OBSTRUCTION, HX OF (08/21/2007).  PAST SURGICAL HISTORY: He  has a past surgical history that includes Inguinal hernia repair (Left, 1990's); Cardiac catheterization (N/A, 02/16/2016); Colonoscopy (N/A, 04/01/2016); ir generic historical (07/05/2016); XI abdominal perineal resection (N/A, 09/28/2016); IR Radiologist Eval & Mgmt (03/29/2017); IR Fluoro Guide CV Line  Right (10/18/2017); and IR US Guide Vasc Access Right (10/18/2017).  Allergies  Allergen Reactions  . Nsaids     Kidney disease   No current facility-administered medications on file prior to encounter.    Current Outpatient Medications on File Prior to Encounter  Medication Sig  . acetaminophen (TYLENOL) 500 MG tablet Take 1,000 mg by mouth every 6 (six) hours as needed (for pain/fever/headaches.).   Marland Kitchen allopurinol (ZYLOPRIM) 300 MG tablet take 1 tablet by mouth once daily  . Amino Acids-Protein Hydrolys (FEEDING SUPPLEMENT, PRO-STAT SUGAR FREE 64,) LIQD Take 30 mLs by mouth 3 (three) times daily with meals.  Marland Kitchen atorvastatin (LIPITOR) 10 MG tablet Take 10 mg by mouth at bedtime.   . Baclofen 5 MG TABS Take 5 mg by mouth 3 (three) times daily as needed for muscle spasms.  Marland Kitchen buPROPion (WELLBUTRIN) 100 MG tablet take 1 tablet by mouth twice a day  . Melatonin 5 MG TABS Take 5 mg by mouth at bedtime.  . Multiple Vitamins-Minerals (MULTIVITAMIN WITH MINERALS) tablet Take 1 tablet by mouth daily. Men's 50+ Multivitamin  . nitroGLYCERIN (NITROSTAT) 0.4 MG SL tablet place 1 tablet under the tongue if needed every 5 minutes for chest pain for 3 doses IF NO RELIEF AFTER 1ST DOSE CALL 911.  Marland Kitchen oxyCODONE-acetaminophen (PERCOCET/ROXICET) 5-325 MG tablet Take 1 tablet by mouth every 6 (six) hours as needed for severe pain.  . pantoprazole (PROTONIX) 40 MG tablet Take 1 tablet (40 mg total) by mouth daily.  . traZODone (DESYREL) 50 MG tablet Take 1 tablet (50 mg total) by mouth at bedtime as needed for sleep.  Marland Kitchen warfarin (COUMADIN) 2 MG  tablet 2mg  every evening. Check INR on 6/4. Adjust depending on INR. Goal INR: 2-3. (Patient taking differently: Take 2 mg by mouth daily at 6 PM. )  . Nutritional Supplements (FEEDING SUPPLEMENT, NEPRO CARB STEADY,) LIQD Take 237 mLs by mouth 3 (three) times daily between meals. (Patient not taking: Reported on 12/07/2017)   FAMILY HISTORY:  His family history includes  Arthritis in his sister; COPD in his sister; Cancer (age of onset: 49) in his father; Dementia in his father; Diabetes in his mother; Edema in his sister; Hypertension in his mother; Stroke in his mother.  SOCIAL HISTORY: He  reports that he has quit smoking. His smoking use included pipe and cigars. He quit after 5.00 years of use. He has never used smokeless tobacco. He reports that he does not drink alcohol or use drugs.  REVIEW OF SYSTEMS:   Review of Systems  Unable to perform ROS: Critical illness   SUBJECTIVE:  Intubated and Sedated  VITAL SIGNS: BP 102/79   Pulse 85   Temp (!) 96.4 F (35.8 C) (Tympanic)   Resp 16   Ht 5\' 8"  (1.727 m)   Wt 82.6 kg (182 lb)   SpO2 100%   BMI 27.67 kg/m   VENTILATOR SETTINGS:  PRVC, 100% FIO2, 5 PEEP, 18 RR, Vt 530  INTAKE / OUTPUT: No intake/output data recorded.  PHYSICAL EXAMINATION: General: WDWN Adult male, Intubated and Sedated, Critically ill Neuro: RASS 0, Spontaneously awake and calm, PERRL, Obeying commands, Moving all extremities  HEENT: OP clear, MM moist, Orally intubated Cardiovascular: Irreg Irreg with HR 59 Lungs: CTA b/l Abdomen: Soft NTND, LLQ Ostomy  Musculoskeletal: 1+ LLE edema; no RLE edema  Skin: no rashes   LABS:  BMET Recent Labs  Lab 12/07/17 0206 12/07/17 0224  NA 139 140  K 4.6 4.4  CL 104 104  CO2 16*  --   BUN 53* 62*  CREATININE 5.00* 4.80*  GLUCOSE 156* 149*   Electrolytes Recent Labs  Lab 12/07/17 0206  CALCIUM 8.1*  MG 1.8   CBC Recent Labs  Lab 12/07/17 0206 12/07/17 0224  WBC 6.1  --   HGB 9.4* 10.9*  HCT 32.1* 32.0*  PLT 157  --    Coag's Recent Labs  Lab 12/07/17 0206  INR 3.02   Sepsis Markers Recent Labs  Lab 12/07/17 0225  LATICACIDVEN 8.06*   ABG No results for input(s): PHART, PCO2ART, PO2ART in the last 168 hours.  Liver Enzymes Recent Labs  Lab 12/07/17 0206  AST 67*  ALT 25  ALKPHOS 166*  BILITOT 1.2  ALBUMIN 2.3*   Cardiac Enzymes No  results for input(s): TROPONINI, PROBNP in the last 168 hours.  Glucose No results for input(s): GLUCAP in the last 168 hours.  Imaging Ct Head Wo Contrast  Result Date: 12/07/2017 CLINICAL DATA:  Found down at side of bed, cardioverted. History of hypertension, hyperlipidemia. EXAM: CT HEAD WITHOUT CONTRAST CT CERVICAL SPINE WITHOUT CONTRAST TECHNIQUE: Multidetector CT imaging of the head and cervical spine was performed following the standard protocol without intravenous contrast. Multiplanar CT image reconstructions of the cervical spine were also generated. COMPARISON:  CT HEAD June 01, 2014 FINDINGS: CT HEAD FINDINGS BRAIN: No intraparenchymal hemorrhage, mass effect nor midline shift. Moderate parenchymal brain volume loss. No hydrocephalus. Patchy supratentorial white matter hypodensities. No acute large vascular territory infarcts. No abnormal extra-axial fluid collections. Basal cisterns are patent. VASCULAR: Moderate calcific atherosclerosis of the carotid siphons. SKULL: No skull fracture. No significant scalp soft  tissue swelling. Focal calcified parietal convexity tricholemmal cyst. SINUSES/ORBITS: Mild-to-moderate paranasal sinus mucosal thickening. Mastoid air cells are well aerated.The included ocular globes and orbital contents are non-suspicious. OTHER: Life-support lines in place. CT CERVICAL SPINE FINDINGS ALIGNMENT: Straightened lordosis.  Vertebral bodies in alignment. SKULL BASE AND VERTEBRAE: Cervical vertebral bodies and posterior elements are intact. Moderate to severe C3-4 through C6-7 disc height loss with endplate spurring compatible with degenerative discs, moderate C2-3. C1-2 articulation maintained with moderate arthropathy. No destructive bony lesions. SOFT TISSUES AND SPINAL CANAL: Nonacute. Mild calcific atherosclerosis carotid bifurcations. DISC LEVELS: Severe LEFT C3-4, RIGHT C4-5 neural foraminal narrowing. Mild canal stenosis C6-7. UPPER CHEST: Lung apices are clear.  OTHER: Life-support lines in place. IMPRESSION: CT HEAD: 1. No acute intracranial process. 2. Moderate parenchymal brain volume loss. 3. Moderate chronic small vessel ischemic changes. CT CERVICAL SPINE: 1. No fracture or malalignment. Electronically Signed   By: Elon Alas M.D.   On: 12/07/2017 03:20   Ct Cervical Spine Wo Contrast  Result Date: 12/07/2017 CLINICAL DATA:  Found down at side of bed, cardioverted. History of hypertension, hyperlipidemia. EXAM: CT HEAD WITHOUT CONTRAST CT CERVICAL SPINE WITHOUT CONTRAST TECHNIQUE: Multidetector CT imaging of the head and cervical spine was performed following the standard protocol without intravenous contrast. Multiplanar CT image reconstructions of the cervical spine were also generated. COMPARISON:  CT HEAD June 01, 2014 FINDINGS: CT HEAD FINDINGS BRAIN: No intraparenchymal hemorrhage, mass effect nor midline shift. Moderate parenchymal brain volume loss. No hydrocephalus. Patchy supratentorial white matter hypodensities. No acute large vascular territory infarcts. No abnormal extra-axial fluid collections. Basal cisterns are patent. VASCULAR: Moderate calcific atherosclerosis of the carotid siphons. SKULL: No skull fracture. No significant scalp soft tissue swelling. Focal calcified parietal convexity tricholemmal cyst. SINUSES/ORBITS: Mild-to-moderate paranasal sinus mucosal thickening. Mastoid air cells are well aerated.The included ocular globes and orbital contents are non-suspicious. OTHER: Life-support lines in place. CT CERVICAL SPINE FINDINGS ALIGNMENT: Straightened lordosis.  Vertebral bodies in alignment. SKULL BASE AND VERTEBRAE: Cervical vertebral bodies and posterior elements are intact. Moderate to severe C3-4 through C6-7 disc height loss with endplate spurring compatible with degenerative discs, moderate C2-3. C1-2 articulation maintained with moderate arthropathy. No destructive bony lesions. SOFT TISSUES AND SPINAL CANAL: Nonacute.  Mild calcific atherosclerosis carotid bifurcations. DISC LEVELS: Severe LEFT C3-4, RIGHT C4-5 neural foraminal narrowing. Mild canal stenosis C6-7. UPPER CHEST: Lung apices are clear. OTHER: Life-support lines in place. IMPRESSION: CT HEAD: 1. No acute intracranial process. 2. Moderate parenchymal brain volume loss. 3. Moderate chronic small vessel ischemic changes. CT CERVICAL SPINE: 1. No fracture or malalignment. Electronically Signed   By: Elon Alas M.D.   On: 12/07/2017 03:20   Dg Chest Port 1 View  Result Date: 12/07/2017 CLINICAL DATA:  Intubation. EXAM: PORTABLE CHEST 1 VIEW COMPARISON:  Chest radiograph Oct 23, 2017 FINDINGS: Endotracheal tube tip projects 16 mm above the carina. Nasogastric tube looped in proximal stomach, distal tip projecting at GE junction. Multiple EKG lines overlie the patient and may obscure subtle underlying pathology. Tunneled dialysis catheter via RIGHT internal jugular venous approach with distal tip projecting in RIGHT atrium. Cardiomegaly and pulmonary vascular congestion. Low inspiratory examination with crowded vascular markings. Potential LEFT pleural effusion. RIGHT costophrenic angle out of field of view. No pneumothorax. Soft tissue planes and included osseous structures are non suspicious. IMPRESSION: Endotracheal tube tip projects 16 mm above the carina. Nasogastric tube looped in proximal stomach, distal tip projecting at GE junction. Cardiomegaly and pulmonary vascular congestion. Electronically Signed  By: Elon Alas M.D.   On: 12/07/2017 02:24   CULTURES: Blood cultures (7/11): pending UA (7/11): ordered Sputum culture (7/11): pending  ANTIBIOTICS: None   SIGNIFICANT EVENTS: 7/11: presented to ER following unstable VT requiring defibrillation; intubated on ER arrival  LINES/TUBES: RIJ Tunneled HD Catheter (present on admission) 2-PIV's ETT 7/11>> OG tube 7/11>>  DISCUSSION: 67yoM with CHF (EF 30%), CAD, HTN, DM, OA, ESRD on HD  (started HD on 10/09/17), Colon cancer s/p colostomy (2018), and Afib (on coumadin), presents to the ER following unstable VT requiring defibrillation and intubation.   ASSESSMENT / PLAN:  PULMONARY 1. Acute Hypoxic Respiratory Failure: - ABG shows no hypercapnea; FIO2 weaned on vent - CXR on my review shows mild pulmonary edema - continue daily ABG and CXRs while intubated; hopefully can wean to extubation this AM  CARDIOVASCULAR 1. Ventricular Tachycardia; Hx CAD, CHF (EF 30%), HTN, Afib:  - patient had unstable VT today at his nursing home, which EMS captured on rhythm strip. Cards has reviewed this strip in the ER and feels it was VT, not Afib with abberancy. It is unclear what caused this VT though. No electrolyte abnormalities other than a Magnesium of 1.8 which is being repleted. No signs of ischemia on EKG, and Troponin 0.10. Will continue to trend troponin. INR is therapeutic so feel it unlikely that patient has a PE. It is possible that this arrhythmia is related to his advanced cardiomyopathy, and he may need to have an AICD placed - continue Amiodarone infusion - TTE ordered - Appreciate Cardiology rec's - hold home antihypertensives given his current soft BP  RENAL 1. ESRD on HD: - recently started on HD on 10/09/17; has been on a M/W/F schedule per notes. Unclear when his last HD session was. Will need to consult Nephrology in AM. No urgent need for HD now.  GASTROINTESTINAL No active issues; NPO; GI prophylaxis  HEMATOLOGIC 1. Anemia: - Hgb 9.4 which is at his baseline; continue to monitor  INFECTIOUS 1. Lactic acidosis:  - unclear if this is related to infection vs dehydration vs possible seizure which could've led to his being found down - CXR shows mild pulm edema but no infiltrates.  - trend lactate; check procalcitonin. Pan-culture. Hold off on antibiotics without more convincing evidence of infectioon  ENDOCRINE 1. DM: - NPO; SSI  NEUROLOGIC No active  issues; Fentanyl for vent tolerance   FAMILY  - Updates: No family present in ER at time of my exam - Inter-disciplinary family meet or Palliative Care meeting due by: 12/13/17  60 minutes critical care time   Vernie Murders, MD  Pulmonary and No Name Pager: [phone]  12/07/2017, 4:15 AM

## 2017-12-07 NOTE — ED Triage Notes (Signed)
Py arrived from [facility]  Ems arrived   The pt fell  Found at the side of his bed  Alert oriented c/o sob  On monitor  vt 200   adenocard  6mg  12mg  12mg    Pt stopped responding  cardioiverted backing up to the ed.

## 2017-12-07 NOTE — ED Notes (Signed)
Patient is alert , answers questions approat. By moving   Head.

## 2017-12-07 NOTE — Progress Notes (Signed)
Received call from Dr. Jonnie Finner, order received to decrease Patrick [name]' HD trmt time to 3 hrs d/t emergent patient requiring trmt.  Time adjusted accordingly, along with UF goal.

## 2017-12-07 NOTE — ED Notes (Signed)
Intubated  With 8.0 23 at lip by rersp therapy

## 2017-12-07 NOTE — Consult Note (Signed)
Renal Service Consult Note [facility]  Patrick [name] 12/07/2017 Patrick [name] Requesting Physician: Dr Jimmey Ralph  Reason for Consult:  ESRD pt admitted  HPI: The patient is a 67 y.o. year-old w/ ESRD, DM, HTN CAD, HFrEF, colon Ca/ sp colostomy, afib admitted early this am after LOC episode and VTach arrest.  We are asked to see for ESRD.   Patient lives in a nursing home, he was found down on the floor and not responding according to ER notes.  He awoke but was SOB, then developed VTach and was coded/ defibrillated by EMS team and responded.  Intubated in ED.  Today pt is wide awake on the ventilator.  No c/o's at this time.   Pt just started dialysis in May 2019 while in hospital for urosepsis and AKI on CKD3.  Underwent CRRT and HD while inpt.  Was eventually dc'd on 10/27/17 to [facility] TTS schedule for outpt HD.  He had a hx of urinary retention w/ indwelling foley but the foley was taken out and daily bladder scans in the hospital showed no urinary retention.   ROS  denies CP  no joint pain   no HA  no blurry vision  no rash  no diarrhea  no nausea/ vomiting     Past Medical History  Past Medical History:  Diagnosis Date  . Arthritis    "right hand; right knee" (06/19/2014)  . CAD (coronary artery disease)    2v CAD with subtotal occlusion of small co-dominant RCA and borderline lesion in moderate-sized OM-1  . CHF (congestive heart failure) ([facility])    Preserved EF  . Coronary artery disease involving native coronary artery of native heart with angina pectoris ([place]) 09/11/2016  . Degenerative joint disease of knee, right   . Dysrhythmia   . Enlarged prostate   . Hyperlipidemia   . Hypertension   . Pneumonia 05/2014  . Prediabetes 10/03/2014  . Scrotal edema 04/03/2015  . SMALL BOWEL OBSTRUCTION, HX OF 08/21/2007   Annotation: with narrowing in the ileocecal region Qualifier: Diagnosis of  By: Hassell Done FNP, Tori Milks     Past Surgical History  Past Surgical  History:  Procedure Laterality Date  . CARDIAC CATHETERIZATION N/A 02/16/2016   Procedure: Right/Left Heart Cath and Coronary Angiography;  Surgeon: Jolaine Artist, MD;  Location: [facility];  Service: Cardiovascular;  Laterality: N/A;  . COLONOSCOPY N/A 04/01/2016   Procedure: COLONOSCOPY;  Surgeon: Leighton Ruff, MD;  Location: [facility] ENDOSCOPY;  Service: Endoscopy;  Laterality: N/A;  . INGUINAL HERNIA REPAIR Left 1990's  . IR FLUORO GUIDE CV LINE RIGHT  10/18/2017  . IR GENERIC HISTORICAL  07/05/2016   IR RADIOLOGIST EVAL & MGMT 07/05/2016 Corrie Mckusick, DO [facility] INTERV RAD  . IR RADIOLOGIST EVAL & MGMT  03/29/2017  . IR US GUIDE VASC ACCESS RIGHT  10/18/2017  . XI ROBOT ABDOMINAL PERINEAL RESECTION N/A 09/28/2016   Procedure: XI ROBOT ABDOMINOPERINEAL RESECTION WITH PERMANENT COLOSTOMY ERAS PATHWAY;  Surgeon: Michael Boston, MD;  Location: [facility] ORS;  Service: General;  Laterality: N/A;   Family History  Family History  Problem Relation Age of Onset  . Hypertension Mother   . Diabetes Mother   . Stroke Mother   . Cancer Father 58       Prostate  . Dementia Father   . COPD Sister   . Arthritis Sister   . Edema Sister    Social History  reports that he has quit smoking. His smoking  use included pipe and cigars. He quit after 5.00 years of use. He has never used smokeless tobacco. He reports that he does not drink alcohol or use drugs. Allergies  Allergies  Allergen Reactions  . Nsaids     Kidney disease   Home medications Prior to Admission medications   Medication Sig Start Date End Date Taking? Authorizing Provider  acetaminophen (TYLENOL) 500 MG tablet Take 1,000 mg by mouth every 6 (six) hours as needed (for pain/fever/headaches.).    Yes [provider]  allopurinol (ZYLOPRIM) 300 MG tablet take 1 tablet by mouth once daily 04/03/17  Yes Mayo, Pete Pelt, MD  Amino Acids-Protein Hydrolys (FEEDING SUPPLEMENT, PRO-STAT SUGAR FREE 64,) LIQD Take 30 mLs by mouth 3 (three)  times daily with meals.   Yes [provider]  atorvastatin (LIPITOR) 10 MG tablet Take 10 mg by mouth at bedtime.  08/19/17  Yes [provider]  Baclofen 5 MG TABS Take 5 mg by mouth 3 (three) times daily as needed for muscle spasms. 10/27/17  Yes Bonnielee Haff, MD  buPROPion [facility]) 100 MG tablet take 1 tablet by mouth twice a day 03/14/17  Yes Mikell, Jeani Sow, MD  Melatonin 5 MG TABS Take 5 mg by mouth at bedtime.   Yes [provider]  Multiple Vitamins-Minerals (MULTIVITAMIN WITH MINERALS) tablet Take 1 tablet by mouth daily. Men's 50+ Multivitamin   Yes [provider]  nitroGLYCERIN (NITROSTAT) 0.4 MG SL tablet place 1 tablet under the tongue if needed every 5 minutes for chest pain for 3 doses IF NO RELIEF AFTER 1ST DOSE CALL 911. 05/10/17  Yes Mayo, Pete Pelt, MD  oxyCODONE-acetaminophen (PERCOCET/ROXICET) 5-325 MG tablet Take 1 tablet by mouth every 6 (six) hours as needed for severe pain. 10/27/17  Yes Bonnielee Haff, MD  pantoprazole (PROTONIX) 40 MG tablet Take 1 tablet (40 mg total) by mouth daily. 10/27/17  Yes Bonnielee Haff, MD  traZODone (DESYREL) 50 MG tablet Take 1 tablet (50 mg total) by mouth at bedtime as needed for sleep. 10/27/17  Yes Bonnielee Haff, MD  warfarin (COUMADIN) 2 MG tablet 2mg  every evening. Check INR on 6/4. Adjust depending on INR. Goal INR: 2-3. Patient taking differently: Take 2 mg by mouth daily at 6 PM.  10/27/17  Yes Bonnielee Haff, MD  Nutritional Supplements (FEEDING SUPPLEMENT, NEPRO CARB STEADY,) LIQD Take 237 mLs by mouth 3 (three) times daily between meals. Patient not taking: Reported on 12/07/2017 10/27/17   Bonnielee Haff, MD   Liver Function Tests Recent Labs  Lab 12/07/17 0206  AST 67*  ALT 25  ALKPHOS 166*  BILITOT 1.2  PROT 6.4*  ALBUMIN 2.3*   No results for input(s): LIPASE, AMYLASE in the last 168 hours. CBC Recent Labs  Lab 12/07/17 0206 12/07/17 0224 12/07/17 0646  WBC 6.1  --   6.6  NEUTROABS 4.7  --   --   HGB 9.4* 10.9* 8.7*  HCT 32.1* 32.0* 29.0*  MCV 89.9  --  89.0  PLT 157  --  628*   Basic Metabolic Panel Recent Labs  Lab 12/07/17 0206 12/07/17 0224 12/07/17 0646  NA 139 140 140  K 4.6 4.4 3.6  CL 104 104 101  CO2 16*  --  20*  GLUCOSE 156* 149* 92  BUN 53* 62* 54*  CREATININE 5.00* 4.80* 5.37*  CALCIUM 8.1*  --  8.3*  PHOS  --   --  5.2*   Iron/TIBC/Ferritin/ %Sat    Component Value Date/Time  IRON 45 10/11/2017 0920   IRON 29 (L) 10/19/2016 1039   TIBC 148 (L) 10/11/2017 0920   TIBC 191 (L) 10/19/2016 1039   FERRITIN 686 (H) 10/11/2017 0920   FERRITIN 125 12/22/2016 1446   IRONPCTSAT 30 10/11/2017 0920   IRONPCTSAT 15 (L) 10/19/2016 1039    Vitals:   12/07/17 0752 12/07/17 0800 12/07/17 0806 12/07/17 0900  BP:  117/86  112/76  Pulse: 74 76  75  Resp: 17 18  18   Temp:   97.7 F (36.5 C)   TempSrc:   Oral   SpO2: 100% 100%  100%  Weight:      Height:       Exam Gen frail older adult male on vent, responsive No rash, cyanosis or gangrene Sclera anicteric, throat w ETT  No jvd or bruits Chest clear ant/ lat, no wheezing RRR loud 3/6 holo syst M, no RG Abd soft ntnd no mass or ascites +bs, LLQ colostomy GU normal male w condom cath MS R knee is warm and ++effusion, L knee normal Ext 1-2+ bilat LE edema Neuro is alert, Ox 3 , nf, moves all ext    Home meds:  - coumadin 2mg  hs/ lipitor 10 mg hs  - wellbutrin 100mg  qd/ baclofen 5 mg tid prn/ percocet prn/ desyrel 50 hs  - protonix 40 qd/ sl ntg prn/ mvi/ allopurinol 300 qd  Dialysis:  [facility] (recent start  4h   400/800  79kg   3K/ 2Ca bath  Hep 2700  R IJ TDC - mircera 75 every 2 wks last 6/27   Impression: 1  VTach - sp defib/ near cardiac arrest; on IV amio, echo pending 2  Afib - on coumadin 3  HFrEF - EF 30% 4  ESRD - on HD TTS 5  Volume - up 3kg today, vasc congestion on cxr 6  Hx colon Ca - sp colostomy 7  Depression 8  Gout 9  Anemia ckd - Hb 9's,  follow, due for esa soon  Plan - HD today in ICU, max UF 3kg, esa w/ darbe 60 ug  Kelly Splinter MD [employer] pager [phone]   12/07/2017, 11:07 AM

## 2017-12-07 NOTE — Progress Notes (Signed)
Pt transported to [facility] CVICU Room 19. CHG bath complete. 4eyes skin assessment complete. Connected to bedside monitor and zoll. Attempted foley placement. Much resistance met. Pt confirms hx of prostate issues. Condom cath placed. To be readdressed in AM. Will pass along in report. IV GTT rates confirmed.

## 2017-12-07 NOTE — Progress Notes (Signed)
Placed patient back on PRVC due to apnea on CPAP mode.

## 2017-12-07 NOTE — ED Provider Notes (Signed)
[facility] EMERGENCY DEPARTMENT Provider Note   CSN: [id number] Arrival date & time: 12/07/17  0148     History   Chief Complaint Chief Complaint  Patient presents with  . Altered Mental Status    HPI Patrick [name] is a 67 y.o. male.  Patient with history of coronary artery disease and congestive heart failure resents to the ER by ambulance from nursing home after a syncopal episode.  He apparently had an unwitnessed syncopal episode, was found on the ground.  He was initially unresponsive, but staff was able to awaken him.  EMS responded to the scene and found him on the ground.  They report that he was awake, answering questions.  He was able to say that he did not have any pain from the fall.  When they put him on the monitor, however, they found him to be in V. tach at 180 to 200 bpm.  They attempted to treat him with adenosine followed by amiodarone, but he decompensated.  Blood pressure dropped down into the 50s and he became unresponsive, was shocked and went into a sinus rhythm.  He maintained spontaneous circulation, but has been somnolent and altered throughout transport. Level V Caveat due to acuity.     Past Medical History:  Diagnosis Date  . Arthritis    "right hand; right knee" (06/19/2014)  . CAD (coronary artery disease)    2v CAD with subtotal occlusion of small co-dominant RCA and borderline lesion in moderate-sized OM-1  . CHF (congestive heart failure) (HCC)    Preserved EF  . Coronary artery disease involving native coronary artery of native heart with angina pectoris ([place]) 09/11/2016  . Degenerative joint disease of knee, right   . Dysrhythmia   . Enlarged prostate   . Hyperlipidemia   . Hypertension   . Pneumonia 05/2014  . Prediabetes 10/03/2014  . Scrotal edema 04/03/2015  . SMALL BOWEL OBSTRUCTION, HX OF 08/21/2007   Annotation: with narrowing in the ileocecal region Qualifier: Diagnosis of  By: Hassell Done FNP, Tori Milks      Patient Active  Problem List   Diagnosis Date Noted  . Shock circulatory ([place]) 10/06/2017  . Acute renal disease   . Compromised airway   . Acute respiratory failure with hypoxemia ([place])   . Community acquired bacterial pneumonia 05/24/2017  . Renal cell carcinoma, left ([place]) 05/24/2017  . Liver cirrhosis ([place]) 05/24/2017  . Pneumonia 05/23/2017  . Balance problem 03/07/2017  . Pulmonary HTN ([place]) 11/10/2016  . History of resection of rectum 09/28/2016  . Rectal cancer ([place]) 09/28/2016  . Atrial fibrillation ([place]) 09/11/2016  . Coronary artery disease involving native coronary artery of native heart with angina pectoris ([place]) 09/11/2016  . Nonischemic cardiomyopathy ([place]) 09/11/2016  . Thrombocytopenia ([place]) 05/29/2016  . Adenocarcinoma of rectum s/p robotic APR/colostomy 09/28/2016 04/14/2016  . Chronic kidney disease (CKD), stage III (moderate) ([place]) 02/05/2016  . Abnormal finding on cardiovascular stress test 02/05/2016  . Cardiomyopathy, ischemic 06/17/2015  . Acute on chronic clinical systolic heart failure ([place]) 06/17/2015  . Major depressive disorder, recurrent episode ([place]) 04/17/2015  . Hypokalemia 08/29/2014  . Other iron deficiency anemias   . Elevated troponin I measurement   . Encounter for central line placement 03/11/2014  . Obesity, unspecified 10/03/2013  . Dyslipidemia 08/13/2013  . MACULAR DEGENERATION 05/02/2008  . ERECTILE DYSFUNCTION 03/13/2007  . Essential hypertension 01/16/2007  . BPH (benign prostatic hyperplasia) 01/16/2007    Past Surgical History:  Procedure Laterality Date  .  CARDIAC CATHETERIZATION N/A 02/16/2016   Procedure: Right/Left Heart Cath and Coronary Angiography;  Surgeon: Jolaine Artist, MD;  Location: [facility] CV LAB;  Service: Cardiovascular;  Laterality: N/A;  . COLONOSCOPY N/A 04/01/2016   Procedure: COLONOSCOPY;  Surgeon: Leighton Ruff, MD;  Location: [facility] END[facility]COPY;  Service: Endoscopy;  Laterality: N/A;  . INGUINAL HERNIA REPAIR Left 1990's    . IR FLUORO GUIDE CV LINE RIGHT  10/18/2017  . IR GENERIC HISTORICAL  07/05/2016   IR RADIOLOGIST EVAL & MGMT 07/05/2016 Corrie Mckusick, DO [facility] INTERV RAD  . IR RADIOLOGIST EVAL & MGMT  03/29/2017  . IR US GUIDE VASC ACCESS RIGHT  10/18/2017  . XI ROBOT ABDOMINAL PERINEAL RESECTION N/A 09/28/2016   Procedure: XI ROBOT ABDOMINOPERINEAL RESECTION WITH PERMANENT COLOSTOMY ERAS PATHWAY;  Surgeon: Michael Boston, MD;  Location: [facility] ORS;  Service: General;  Laterality: N/A;        Home Medications    Prior to Admission medications   Medication Sig Start Date End Date Taking? Authorizing Provider  acetaminophen (TYLENOL) 500 MG tablet Take 1,000 mg by mouth every 6 (six) hours as needed (for pain/fever/headaches.).    Yes [provider]  allopurinol (ZYLOPRIM) 300 MG tablet take 1 tablet by mouth once daily 04/03/17  Yes Mayo, Pete Pelt, MD  Amino Acids-Protein Hydrolys (FEEDING SUPPLEMENT, PRO-STAT SUGAR FREE 64,) LIQD Take 30 mLs by mouth 3 (three) times daily with meals.   Yes [provider]  atorvastatin (LIPITOR) 10 MG tablet Take 10 mg by mouth at bedtime.  08/19/17  Yes [provider]  Baclofen 5 MG TABS Take 5 mg by mouth 3 (three) times daily as needed for muscle spasms. 10/27/17  Yes Bonnielee Haff, MD  buPROPion [facility]) 100 MG tablet take 1 tablet by mouth twice a day 03/14/17  Yes Mikell, Jeani Sow, MD  Melatonin 5 MG TABS Take 5 mg by mouth at bedtime.   Yes [provider]  Multiple Vitamins-Minerals (MULTIVITAMIN WITH MINERALS) tablet Take 1 tablet by mouth daily. Men's 50+ Multivitamin   Yes [provider]  nitroGLYCERIN (NITROSTAT) 0.4 MG SL tablet place 1 tablet under the tongue if needed every 5 minutes for chest pain for 3 doses IF NO RELIEF AFTER 1ST DOSE CALL 911. 05/10/17  Yes Mayo, Pete Pelt, MD  oxyCODONE-acetaminophen (PERCOCET/ROXICET) 5-325 MG tablet Take 1 tablet by mouth every 6 (six) hours as needed for severe pain.  10/27/17  Yes Bonnielee Haff, MD  pantoprazole (PROTONIX) 40 MG tablet Take 1 tablet (40 mg total) by mouth daily. 10/27/17  Yes Bonnielee Haff, MD  traZODone (DESYREL) 50 MG tablet Take 1 tablet (50 mg total) by mouth at bedtime as needed for sleep. 10/27/17  Yes Bonnielee Haff, MD  warfarin (COUMADIN) 2 MG tablet 2mg  every evening. Check INR on 6/4. Adjust depending on INR. Goal INR: 2-3. Patient taking differently: Take 2 mg by mouth daily at 6 PM.  10/27/17  Yes Bonnielee Haff, MD  Nutritional Supplements (FEEDING SUPPLEMENT, NEPRO CARB STEADY,) LIQD Take 237 mLs by mouth 3 (three) times daily between meals. Patient not taking: Reported on 12/07/2017 10/27/17   Bonnielee Haff, MD    Family History Family History  Problem Relation Age of Onset  . Hypertension Mother   . Diabetes Mother   . Stroke Mother   . Cancer Father 37       Prostate  . Dementia Father   . COPD Sister   . Arthritis Sister   . Edema Sister  Social History Social History   Tobacco Use  . Smoking status: Former Smoker    Years: 5.00    Types: Pipe, Landscape architect  . Smokeless tobacco: Never Used  . Tobacco comment: 06/19/2014 "stopped smoking in ~ 2014; used to smoke a pipe or cigar a couple times/month"  Substance Use Topics  . Alcohol use: No  . Drug use: No     Allergies   Nsaids   Review of Systems Review of Systems  Unable to perform ROS: Acuity of condition     Physical Exam Updated Vital Signs BP 90/68   Pulse 88   Temp (!) 96.4 F (35.8 C) (Tympanic)   Resp 16   Ht 5\' 8"  (1.727 m)   Wt 82.6 kg (182 lb)   SpO2 100%   BMI 27.67 kg/m   Physical Exam  Constitutional: He appears well-developed and well-nourished.  HENT:  Head: Atraumatic.  Eyes: Pupils are equal, round, and reactive to light.  Neck: Neck supple.  Cardiovascular: Normal rate, regular rhythm and normal heart sounds.  Pulmonary/Chest: Bradypnea noted. He has decreased breath sounds.  Abdominal: Soft.    Musculoskeletal: He exhibits no deformity.  Neurological: GCS eye subscore is 1. GCS verbal subscore is 2. GCS motor subscore is 5.  Skin: Skin is intact.     ED Treatments / Results  Labs (all labs ordered are listed, but only abnormal results are displayed) Labs Reviewed  CBC WITH DIFFERENTIAL/PLATELET - Abnormal; Notable for the following components:      Result Value   RBC 3.57 (*)    Hemoglobin 9.4 (*)    HCT 32.1 (*)    MCHC 29.3 (*)    RDW 19.4 (*)    Lymphs Abs 0.6 (*)    All other components within normal limits  COMPREHENSIVE METABOLIC PANEL - Abnormal; Notable for the following components:   CO2 16 (*)    Glucose, Bld 156 (*)    BUN 53 (*)    Creatinine, Ser 5.00 (*)    Calcium 8.1 (*)    Total Protein 6.4 (*)    Albumin 2.3 (*)    AST 67 (*)    Alkaline Phosphatase 166 (*)    GFR calc non Af Amer 11 (*)    GFR calc Af Amer 12 (*)    Anion gap 19 (*)    All other components within normal limits  PROTIME-INR - Abnormal; Notable for the following components:   Prothrombin Time 31.0 (*)    All other components within normal limits  I-STAT CG4 LACTIC ACID, ED - Abnormal; Notable for the following components:   Lactic Acid, Venous 8.06 (*)    All other components within normal limits  I-STAT CHEM 8, ED - Abnormal; Notable for the following components:   BUN 62 (*)    Creatinine, Ser 4.80 (*)    Glucose, Bld 149 (*)    Calcium, Ion 1.01 (*)    TCO2 17 (*)    Hemoglobin 10.9 (*)    HCT 32.0 (*)    All other components within normal limits  I-STAT TROPONIN, ED - Abnormal; Notable for the following components:   Troponin i, poc 0.10 (*)    All other components within normal limits  MAGNESIUM  URINALYSIS, ROUTINE W REFLEX MICROSCOPIC  I-STAT ARTERIAL BLOOD GAS, ED    EKG EKG Interpretation  Date/Time:  Thursday December 07 2017 01:50:33 EDT Ventricular Rate:  99 PR Interval:    QRS Duration: 200 QT Interval:  462 QTC Calculation: 593 R  Axis:   -105 Text Interpretation:  Accelerated junctional rhythm RBBB and LAFB No significant change since last tracing Confirmed by Orpah Greek [phone]) on 12/07/2017 3:30:59 AM   Radiology Ct Head Wo Contrast  Result Date: 12/07/2017 CLINICAL DATA:  Found down at side of bed, cardioverted. History of hypertension, hyperlipidemia. EXAM: CT HEAD WITHOUT CONTRAST CT CERVICAL SPINE WITHOUT CONTRAST TECHNIQUE: Multidetector CT imaging of the head and cervical spine was performed following the standard protocol without intravenous contrast. Multiplanar CT image reconstructions of the cervical spine were also generated. COMPARISON:  CT HEAD June 01, 2014 FINDINGS: CT HEAD FINDINGS BRAIN: No intraparenchymal hemorrhage, mass effect nor midline shift. Moderate parenchymal brain volume loss. No hydrocephalus. Patchy supratentorial white matter hypodensities. No acute large vascular territory infarcts. No abnormal extra-axial fluid collections. Basal cisterns are patent. VASCULAR: Moderate calcific atherosclerosis of the carotid siphons. SKULL: No skull fracture. No significant scalp soft tissue swelling. Focal calcified parietal convexity tricholemmal cyst. SINUSES/ORBITS: Mild-to-moderate paranasal sinus mucosal thickening. Mastoid air cells are well aerated.The included ocular globes and orbital contents are non-suspicious. OTHER: Life-support lines in place. CT CERVICAL SPINE FINDINGS ALIGNMENT: Straightened lordosis.  Vertebral bodies in alignment. SKULL BASE AND VERTEBRAE: Cervical vertebral bodies and posterior elements are intact. Moderate to severe C3-4 through C6-7 disc height loss with endplate spurring compatible with degenerative discs, moderate C2-3. C1-2 articulation maintained with moderate arthropathy. No destructive bony lesions. SOFT TISSUES AND SPINAL CANAL: Nonacute. Mild calcific atherosclerosis carotid bifurcations. DISC LEVELS: Severe LEFT C3-4, RIGHT C4-5 neural foraminal narrowing.  Mild canal stenosis C6-7. UPPER CHEST: Lung apices are clear. OTHER: Life-support lines in place. IMPRESSION: CT HEAD: 1. No acute intracranial process. 2. Moderate parenchymal brain volume loss. 3. Moderate chronic small vessel ischemic changes. CT CERVICAL SPINE: 1. No fracture or malalignment. Electronically Signed   By: Elon Alas M.D.   On: 12/07/2017 03:20   Ct Cervical Spine Wo Contrast  Result Date: 12/07/2017 CLINICAL DATA:  Found down at side of bed, cardioverted. History of hypertension, hyperlipidemia. EXAM: CT HEAD WITHOUT CONTRAST CT CERVICAL SPINE WITHOUT CONTRAST TECHNIQUE: Multidetector CT imaging of the head and cervical spine was performed following the standard protocol without intravenous contrast. Multiplanar CT image reconstructions of the cervical spine were also generated. COMPARISON:  CT HEAD June 01, 2014 FINDINGS: CT HEAD FINDINGS BRAIN: No intraparenchymal hemorrhage, mass effect nor midline shift. Moderate parenchymal brain volume loss. No hydrocephalus. Patchy supratentorial white matter hypodensities. No acute large vascular territory infarcts. No abnormal extra-axial fluid collections. Basal cisterns are patent. VASCULAR: Moderate calcific atherosclerosis of the carotid siphons. SKULL: No skull fracture. No significant scalp soft tissue swelling. Focal calcified parietal convexity tricholemmal cyst. SINUSES/ORBITS: Mild-to-moderate paranasal sinus mucosal thickening. Mastoid air cells are well aerated.The included ocular globes and orbital contents are non-suspicious. OTHER: Life-support lines in place. CT CERVICAL SPINE FINDINGS ALIGNMENT: Straightened lordosis.  Vertebral bodies in alignment. SKULL BASE AND VERTEBRAE: Cervical vertebral bodies and posterior elements are intact. Moderate to severe C3-4 through C6-7 disc height loss with endplate spurring compatible with degenerative discs, moderate C2-3. C1-2 articulation maintained with moderate arthropathy. No  destructive bony lesions. SOFT TISSUES AND SPINAL CANAL: Nonacute. Mild calcific atherosclerosis carotid bifurcations. DISC LEVELS: Severe LEFT C3-4, RIGHT C4-5 neural foraminal narrowing. Mild canal stenosis C6-7. UPPER CHEST: Lung apices are clear. OTHER: Life-support lines in place. IMPRESSION: CT HEAD: 1. No acute intracranial process. 2. Moderate parenchymal brain volume loss. 3. Moderate chronic small vessel ischemic changes. CT  CERVICAL SPINE: 1. No fracture or malalignment. Electronically Signed   By: Elon Alas M.D.   On: 12/07/2017 03:20   Dg Chest Port 1 View  Result Date: 12/07/2017 CLINICAL DATA:  Intubation. EXAM: PORTABLE CHEST 1 VIEW COMPARISON:  Chest radiograph Oct 23, 2017 FINDINGS: Endotracheal tube tip projects 16 mm above the carina. Nasogastric tube looped in proximal stomach, distal tip projecting at GE junction. Multiple EKG lines overlie the patient and may obscure subtle underlying pathology. Tunneled dialysis catheter via RIGHT internal jugular venous approach with distal tip projecting in RIGHT atrium. Cardiomegaly and pulmonary vascular congestion. Low inspiratory examination with crowded vascular markings. Potential LEFT pleural effusion. RIGHT costophrenic angle out of field of view. No pneumothorax. Soft tissue planes and included osseous structures are non suspicious. IMPRESSION: Endotracheal tube tip projects 16 mm above the carina. Nasogastric tube looped in proximal stomach, distal tip projecting at GE junction. Cardiomegaly and pulmonary vascular congestion. Electronically Signed   By: Elon Alas M.D.   On: 12/07/2017 02:24    Procedures Procedure Name: Intubation Date/Time: 12/07/2017 2:00 AM Performed by: Orpah Greek, MD Pre-anesthesia Checklist: Patient identified, Patient being monitored, Emergency Drugs available, Timeout performed and Suction available Oxygen Delivery Method: Non-rebreather mask Preoxygenation: Pre-oxygenation with 100%  oxygen Induction Type: Rapid sequence Ventilation: Mask ventilation without difficulty Laryngoscope Size: Glidescope and 3 Grade View: Grade I Tube size: 8.0 mm Number of attempts: 1 Placement Confirmation: ETT inserted through vocal cords under direct vision,  CO2 detector and Breath sounds checked- equal and bilateral Secured at: 24 cm Tube secured with: ETT holder Future Recommendations: Recommend- induction with short-acting agent, and alternative techniques readily available      (including critical care time)  Medications Ordered in ED Medications  amiodarone (NEXTERONE) 1.8 mg/mL load via infusion 150 mg (150 mg Intravenous Bolus from Bag 12/07/17 0222)    Followed by  amiodarone (NEXTERONE PREMIX) 360-4.14 MG/200ML-% (1.8 mg/mL) IV infusion (60 mg/hr Intravenous New Bag/Given 12/07/17 0233)    Followed by  amiodarone (NEXTERONE PREMIX) 360-4.14 MG/200ML-% (1.8 mg/mL) IV infusion (has no administration in time range)  fentaNYL 253mcg in NS 254mL (53mcg/ml) infusion-PREMIX (75 mcg/hr Intravenous Rate/Dose Change 12/07/17 0307)  etomidate (AMIDATE) injection (20 mg Intravenous Given 12/07/17 0157)  succinylcholine (ANECTINE) injection (100 mg Intravenous Given 12/07/17 0200)  fentaNYL (SUBLIMAZE) injection 100 mcg (100 mcg Intravenous Given 12/07/17 0228)     Initial Impression / Assessment and Plan / ED Course  I have reviewed the triage vital signs and the nursing notes.  Pertinent labs & imaging results that were available during my care of the patient were reviewed by me and considered in my medical decision making (see chart for details).     Patient presents to the emergency department after syncopal episode at nursing home.  Patient was initially awake and alert, conversing with EMS when they discovered that he was in V. tach.  He quickly decompensated, however, and required DC cardioversion.  He was shocked into a sinus rhythm but did not wake up.  At arrival to the ER,  GCS was 8.  Nursing home paperwork report he is a full code.  Most recent hospitalization CODE STATUS was partial with wanting intubation, patient was therefore intubated.  Patient noted to have multifocal PVCs.  He was therefore initiated on amiodarone.  Discussed briefly with Dr. Elson Areas, on-call for cardiology.  Agrees with current treatment strategy, will not require urgent or emergent cardiac catheterization.  Patient's wide-complex seen on EKG is chronic  and baseline.  Lab work does not show any evidence of hyperkalemia.  Patient starting to wake up, administered fentanyl for sedation.  CT head and cervical spine pending.  Discussed with critical care, will admit to ICU.  CRITICAL CARE Performed by: Orpah Greek   Total critical care time: 45 minutes  Critical care time was exclusive of separately billable procedures and treating other patients.  Critical care was necessary to treat or prevent imminent or life-threatening deterioration.  Critical care was time spent personally by me on the following activities: development of treatment plan with patient and/or surrogate as well as nursing, discussions with consultants, evaluation of patient's response to treatment, examination of patient, obtaining history from patient or surrogate, ordering and performing treatments and interventions, ordering and review of laboratory studies, ordering and review of radiographic studies, pulse oximetry and re-evaluation of patient's condition.   Final Clinical Impressions(s) / ED Diagnoses   Final diagnoses:  VT (ventricular tachycardia) ([place])  Respiratory arrest [facility])    ED Discharge Orders    None       Orpah Greek, MD 12/07/17 [phone]

## 2017-12-07 NOTE — ED Notes (Addendum)
Pt given etomidate 20mg  and succ  100mg  iv by Janett Billow rn  Intubation by dr Betsey Holiday

## 2017-12-07 NOTE — ED Notes (Signed)
Cellphone  And watch locked up in security

## 2017-12-07 NOTE — ED Notes (Signed)
Sister called:  Patrick [name] @ [phone].  She is at [facility] as well.

## 2017-12-07 NOTE — Progress Notes (Signed)
  Echocardiogram 2D Echocardiogram has been performed.  Patrick [name] 12/07/2017, 8:26 AM

## 2017-12-07 NOTE — Progress Notes (Deleted)
Renal Service Consult Note [facility]  Patrick [name] 12/07/2017 [name] Requesting Physician: Dr Jimmey Ralph  Reason for Consult:  ESRD pt admitted  HPI: The patient is a 67 y.o. year-old w/ ESRD, DM, HTN CAD, HFrEF, colon Ca/ sp colostomy, afib admitted early this am after LOC episode and VTach arrest.  We are asked to see for ESRD.   Patient lives in a nursing home, he was found down on the floor and not responding according to ER notes.  He awoke but was SOB, then developed VTach and was coded/ defibrillated by EMS team and responded.  Intubated in ED.  Today pt is wide awake on the ventilator.  No c/o's at this time.   Pt just started dialysis in May 2019 while in hospital for urosepsis and AKI on CKD3.  Underwent CRRT and HD while inpt.  Was eventually dc'd on 10/27/17 to [facility] TTS schedule for outpt HD.  He had a hx of urinary retention w/ indwelling foley but the foley was taken out and daily bladder scans in the hospital showed no urinary retention.   ROS  denies CP  no joint pain   no HA  no blurry vision  no rash  no diarrhea  no nausea/ vomiting     Past Medical History  Past Medical History:  Diagnosis Date  . Arthritis    "right hand; right knee" (06/19/2014)  . CAD (coronary artery disease)    2v CAD with subtotal occlusion of small co-dominant RCA and borderline lesion in moderate-sized OM-1  . CHF (congestive heart failure) (HCC)    Preserved EF  . Coronary artery disease involving native coronary artery of native heart with angina pectoris ([place]) 09/11/2016  . Degenerative joint disease of knee, right   . Dysrhythmia   . Enlarged prostate   . Hyperlipidemia   . Hypertension   . Pneumonia 05/2014  . Prediabetes 10/03/2014  . Scrotal edema 04/03/2015  . SMALL BOWEL OBSTRUCTION, HX OF 08/21/2007   Annotation: with narrowing in the ileocecal region Qualifier: Diagnosis of  By: Hassell Done FNP, Tori Milks     Past Surgical History  Past Surgical  History:  Procedure Laterality Date  . CARDIAC CATHETERIZATION N/A 02/16/2016   Procedure: Right/Left Heart Cath and Coronary Angiography;  Surgeon: Jolaine Artist, MD;  Location: [place] [facility];  Service: Cardiovascular;  Laterality: N/A;  . COLONOSCOPY N/A 04/01/2016   Procedure: COLONOSCOPY;  Surgeon: Leighton Ruff, MD;  Location: [facility] ENDOSCOPY;  Service: Endoscopy;  Laterality: N/A;  . INGUINAL HERNIA REPAIR Left 1990's  . IR FLUORO GUIDE CV LINE RIGHT  10/18/2017  . IR GENERIC HISTORICAL  07/05/2016   IR RADIOLOGIST EVAL & MGMT 07/05/2016 Corrie Mckusick, DO [facility] INTERV RAD  . IR RADIOLOGIST EVAL & MGMT  03/29/2017  . IR US GUIDE VASC ACCESS RIGHT  10/18/2017  . XI ROBOT ABDOMINAL PERINEAL RESECTION N/A 09/28/2016   Procedure: XI ROBOT ABDOMINOPERINEAL RESECTION WITH PERMANENT COLOSTOMY ERAS PATHWAY;  Surgeon: Michael Boston, MD;  Location: [facility] ORS;  Service: General;  Laterality: N/A;   Family History  Family History  Problem Relation Age of Onset  . Hypertension Mother   . Diabetes Mother   . Stroke Mother   . Cancer Father 73       Prostate  . Dementia Father   . COPD Sister   . Arthritis Sister   . Edema Sister    Social History  reports that he has quit smoking. His smoking  use included pipe and cigars. He quit after 5.00 years of use. He has never used smokeless tobacco. He reports that he does not drink alcohol or use drugs. Allergies  Allergies  Allergen Reactions  . Nsaids     Kidney disease   Home medications Prior to Admission medications   Medication Sig Start Date End Date Taking? Authorizing Provider  acetaminophen (TYLENOL) 500 MG tablet Take 1,000 mg by mouth every 6 (six) hours as needed (for pain/fever/headaches.).    Yes [provider]  allopurinol (ZYLOPRIM) 300 MG tablet take 1 tablet by mouth once daily 04/03/17  Yes Mayo, Pete Pelt, MD  Amino Acids-Protein Hydrolys (FEEDING SUPPLEMENT, PRO-STAT SUGAR FREE 64,) LIQD Take 30 mLs by mouth 3 (three)  times daily with meals.   Yes [provider]  atorvastatin (LIPITOR) 10 MG tablet Take 10 mg by mouth at bedtime.  08/19/17  Yes [provider]  Baclofen 5 MG TABS Take 5 mg by mouth 3 (three) times daily as needed for muscle spasms. 10/27/17  Yes Bonnielee Haff, MD  buPROPion [facility]) 100 MG tablet take 1 tablet by mouth twice a day 03/14/17  Yes Mikell, Jeani Sow, MD  Melatonin 5 MG TABS Take 5 mg by mouth at bedtime.   Yes [provider]  Multiple Vitamins-Minerals (MULTIVITAMIN WITH MINERALS) tablet Take 1 tablet by mouth daily. Men's 50+ Multivitamin   Yes [provider]  nitroGLYCERIN (NITROSTAT) 0.4 MG SL tablet place 1 tablet under the tongue if needed every 5 minutes for chest pain for 3 doses IF NO RELIEF AFTER 1ST DOSE CALL 911. 05/10/17  Yes Mayo, Pete Pelt, MD  oxyCODONE-acetaminophen (PERCOCET/ROXICET) 5-325 MG tablet Take 1 tablet by mouth every 6 (six) hours as needed for severe pain. 10/27/17  Yes Bonnielee Haff, MD  pantoprazole (PROTONIX) 40 MG tablet Take 1 tablet (40 mg total) by mouth daily. 10/27/17  Yes Bonnielee Haff, MD  traZODone (DESYREL) 50 MG tablet Take 1 tablet (50 mg total) by mouth at bedtime as needed for sleep. 10/27/17  Yes Bonnielee Haff, MD  warfarin (COUMADIN) 2 MG tablet 2mg  every evening. Check INR on 6/4. Adjust depending on INR. Goal INR: 2-3. Patient taking differently: Take 2 mg by mouth daily at 6 PM.  10/27/17  Yes Bonnielee Haff, MD  Nutritional Supplements (FEEDING SUPPLEMENT, NEPRO CARB STEADY,) LIQD Take 237 mLs by mouth 3 (three) times daily between meals. Patient not taking: Reported on 12/07/2017 10/27/17   Bonnielee Haff, MD   Liver Function Tests Recent Labs  Lab 12/07/17 0206  AST 67*  ALT 25  ALKPHOS 166*  BILITOT 1.2  PROT 6.4*  ALBUMIN 2.3*   No results for input(s): LIPASE, AMYLASE in the last 168 hours. CBC Recent Labs  Lab 12/07/17 0206 12/07/17 0224 12/07/17 0646  WBC 6.1  --   6.6  NEUTROABS 4.7  --   --   HGB 9.4* 10.9* 8.7*  HCT 32.1* 32.0* 29.0*  MCV 89.9  --  89.0  PLT 157  --  725*   Basic Metabolic Panel Recent Labs  Lab 12/07/17 0206 12/07/17 0224 12/07/17 0646  NA 139 140 140  K 4.6 4.4 3.6  CL 104 104 101  CO2 16*  --  20*  GLUCOSE 156* 149* 92  BUN 53* 62* 54*  CREATININE 5.00* 4.80* 5.37*  CALCIUM 8.1*  --  8.3*  PHOS  --   --  5.2*   Iron/TIBC/Ferritin/ %Sat    Component Value Date/Time  IRON 45 10/11/2017 0920   IRON 29 (L) 10/19/2016 1039   TIBC 148 (L) 10/11/2017 0920   TIBC 191 (L) 10/19/2016 1039   FERRITIN 686 (H) 10/11/2017 0920   FERRITIN 125 12/22/2016 1446   IRONPCTSAT 30 10/11/2017 0920   IRONPCTSAT 15 (L) 10/19/2016 1039    Vitals:   12/07/17 0752 12/07/17 0800 12/07/17 0806 12/07/17 0900  BP:  117/86  112/76  Pulse: 74 76  75  Resp: 17 18  18   Temp:   97.7 F (36.5 C)   TempSrc:   Oral   SpO2: 100% 100%  100%  Weight:      Height:       Exam Gen frail older adult male on vent, responsive No rash, cyanosis or gangrene Sclera anicteric, throat w ETT  No jvd or bruits Chest clear ant/ lat, no wheezing RRR loud 3/6 holo syst M, no RG Abd soft ntnd no mass or ascites +bs, LLQ colostomy GU normal male w condom cath MS R knee is warm and ++effusion, L knee normal Ext 1-2+ bilat LE edema Neuro is alert, Ox 3 , nf, moves all ext    Home meds:  - coumadin 2mg  hs/ lipitor 10 mg hs  - wellbutrin 100mg  qd/ baclofen 5 mg tid prn/ percocet prn/ desyrel 50 hs  - protonix 40 qd/ sl ntg prn/ mvi/ allopurinol 300 qd  Dialysis:  [facility] (recent start  4h   400/800  79kg   3K/ 2Ca bath  Hep 2700  R IJ TDC - mircera 75 every 2 wks last 6/27   Impression: 1  VTach - sp defib/ near cardiac arrest; on IV amio, echo pending 2  Afib - on coumadin 3  HFrEF - EF 30% 4  ESRD - on HD TTS 5  Volume - up 3kg today, vasc congestion on cxr 6  Hx colon Ca - sp colostomy 7  Depression 8  Gout 9  Anemia ckd - Hb 9's,  follow, due for esa soon  Plan - HD today in ICU, max UF 3kg, esa w/ darbe 60 ug  Kelly Splinter MD [employer] pager [phone]   12/07/2017, 11:07 AM

## 2017-12-07 NOTE — Progress Notes (Signed)
Patient was seen and examined. Chart, labs and radiology reviewed.  On th event awake following commands.  Chest clear I will do a CPAP trial and assess for possible extubation. Continue amio. Cardiology consult pending.

## 2017-12-07 NOTE — Progress Notes (Signed)
Pt transported to CT2 and back to [facility] without incidence.

## 2017-12-07 NOTE — Progress Notes (Signed)
*  Preliminary Results* Left lower extremity venous duplex completed. Left lower extremity is negative for deep vein thrombosis. There is no evidence of left Baker's cyst.  12/07/2017 6:37 PM  Maudry Mayhew, BS, RVT, RDCS, RDMS

## 2017-12-07 NOTE — Progress Notes (Signed)
Spoke to Cards fellow Dr Elson Areas regarding need for stat cards consult and rec's for this patient with hemodynamically unstable VT. Cards asked to see patient now and not wait till AM.

## 2017-12-07 NOTE — ED Notes (Signed)
To ct

## 2017-12-08 ENCOUNTER — Encounter (HOSPITAL_COMMUNITY): Payer: Self-pay | Admitting: Physician Assistant

## 2017-12-08 ENCOUNTER — Inpatient Hospital Stay (HOSPITAL_COMMUNITY): Payer: Medicare Other

## 2017-12-08 ENCOUNTER — Telehealth: Payer: Self-pay | Admitting: Cardiology

## 2017-12-08 DIAGNOSIS — R092 Respiratory arrest: Secondary | ICD-10-CM

## 2017-12-08 LAB — PROTIME-INR
INR: 2.8
PROTHROMBIN TIME: 29.3 s — AB (ref 11.4–15.2)

## 2017-12-08 LAB — ECHOCARDIOGRAM COMPLETE
HEIGHTINCHES: 67 in
WEIGHTICAEL: 2912 [oz_av]

## 2017-12-08 LAB — CBC WITH DIFFERENTIAL/PLATELET
ABS IMMATURE GRANULOCYTES: 0 10*3/uL (ref 0.0–0.1)
BASOS PCT: 1 %
Basophils Absolute: 0 10*3/uL (ref 0.0–0.1)
Eosinophils Absolute: 0 10*3/uL (ref 0.0–0.7)
Eosinophils Relative: 1 %
HCT: 29.1 % — ABNORMAL LOW (ref 39.0–52.0)
HEMOGLOBIN: 8.8 g/dL — AB (ref 13.0–17.0)
Immature Granulocytes: 1 %
LYMPHS PCT: 12 %
Lymphs Abs: 0.6 10*3/uL — ABNORMAL LOW (ref 0.7–4.0)
MCH: 26.6 pg (ref 26.0–34.0)
MCHC: 30.2 g/dL (ref 30.0–36.0)
MCV: 87.9 fL (ref 78.0–100.0)
MONOS PCT: 16 %
Monocytes Absolute: 0.8 10*3/uL (ref 0.1–1.0)
NEUTROS ABS: 3.4 10*3/uL (ref 1.7–7.7)
Neutrophils Relative %: 69 %
Platelets: 136 10*3/uL — ABNORMAL LOW (ref 150–400)
RBC: 3.31 MIL/uL — ABNORMAL LOW (ref 4.22–5.81)
RDW: 19.3 % — ABNORMAL HIGH (ref 11.5–15.5)
WBC: 4.9 10*3/uL (ref 4.0–10.5)

## 2017-12-08 LAB — GLUCOSE, CAPILLARY
GLUCOSE-CAPILLARY: 49 mg/dL — AB (ref 70–99)
Glucose-Capillary: 107 mg/dL — ABNORMAL HIGH (ref 70–99)
Glucose-Capillary: 120 mg/dL — ABNORMAL HIGH (ref 70–99)
Glucose-Capillary: 69 mg/dL — ABNORMAL LOW (ref 70–99)
Glucose-Capillary: 70 mg/dL (ref 70–99)
Glucose-Capillary: 83 mg/dL (ref 70–99)
Glucose-Capillary: 99 mg/dL (ref 70–99)

## 2017-12-08 LAB — BASIC METABOLIC PANEL
Anion gap: 11 (ref 5–15)
BUN: 30 mg/dL — AB (ref 8–23)
CHLORIDE: 103 mmol/L (ref 98–111)
CO2: 25 mmol/L (ref 22–32)
Calcium: 8.7 mg/dL — ABNORMAL LOW (ref 8.9–10.3)
Creatinine, Ser: 3.75 mg/dL — ABNORMAL HIGH (ref 0.61–1.24)
GFR calc Af Amer: 18 mL/min — ABNORMAL LOW (ref >60)
GFR calc non Af Amer: 15 mL/min — ABNORMAL LOW (ref >60)
GLUCOSE: 77 mg/dL (ref 70–99)
POTASSIUM: 3.3 mmol/L — AB (ref 3.5–5.1)
Sodium: 139 mmol/L (ref 135–145)

## 2017-12-08 LAB — PROCALCITONIN: Procalcitonin: 5.18 ng/mL

## 2017-12-08 LAB — POCT I-STAT 3, ART BLOOD GAS (G3+)
Acid-Base Excess: 1 mmol/L (ref 0.0–2.0)
Bicarbonate: 25.8 mmol/L (ref 20.0–28.0)
O2 Saturation: 100 %
PCO2 ART: 39.4 mmHg (ref 32.0–48.0)
Patient temperature: 98.4
TCO2: 27 mmol/L (ref 22–32)
pH, Arterial: 7.423 (ref 7.350–7.450)
pO2, Arterial: 170 mmHg — ABNORMAL HIGH (ref 83.0–108.0)

## 2017-12-08 MED ORDER — POTASSIUM CHLORIDE CRYS ER 20 MEQ PO TBCR
40.0000 meq | EXTENDED_RELEASE_TABLET | Freq: Once | ORAL | Status: AC
  Administered 2017-12-08: 40 meq via ORAL
  Filled 2017-12-08: qty 2

## 2017-12-08 MED ORDER — CHLORHEXIDINE GLUCONATE CLOTH 2 % EX PADS
6.0000 | MEDICATED_PAD | Freq: Every day | CUTANEOUS | Status: DC
  Administered 2017-12-09 – 2017-12-11 (×2): 6 via TOPICAL

## 2017-12-08 MED ORDER — DEXTROSE 50 % IV SOLN
25.0000 mL | Freq: Once | INTRAVENOUS | Status: AC
  Administered 2017-12-08: 25 mL via INTRAVENOUS

## 2017-12-08 MED ORDER — FAMOTIDINE 40 MG/5ML PO SUSR
20.0000 mg | Freq: Every day | ORAL | Status: DC
  Administered 2017-12-08 – 2017-12-10 (×3): 20 mg
  Filled 2017-12-08 (×4): qty 2.5

## 2017-12-08 MED ORDER — MAGNESIUM OXIDE 400 (241.3 MG) MG PO TABS
400.0000 mg | ORAL_TABLET | Freq: Once | ORAL | Status: AC
  Administered 2017-12-08: 400 mg via ORAL
  Filled 2017-12-08: qty 1

## 2017-12-08 MED ORDER — AMIODARONE HCL 200 MG PO TABS
400.0000 mg | ORAL_TABLET | Freq: Two times a day (BID) | ORAL | Status: DC
  Administered 2017-12-08 – 2017-12-13 (×11): 400 mg via ORAL
  Filled 2017-12-08 (×11): qty 2

## 2017-12-08 MED ORDER — DEXTROSE 10 % IV SOLN
INTRAVENOUS | Status: DC
  Administered 2017-12-08 – 2017-12-12 (×4): via INTRAVENOUS

## 2017-12-08 MED ORDER — DEXTROSE 50 % IV SOLN
INTRAVENOUS | Status: AC
  Administered 2017-12-08: 25 mL via INTRAVENOUS
  Filled 2017-12-08: qty 50

## 2017-12-08 MED ORDER — ACETAMINOPHEN 325 MG PO TABS
650.0000 mg | ORAL_TABLET | Freq: Four times a day (QID) | ORAL | Status: DC | PRN
  Administered 2017-12-08: 650 mg via ORAL
  Filled 2017-12-08: qty 2

## 2017-12-08 NOTE — Progress Notes (Signed)
[facility] Progress Note  Subjective: on vent, weaning well and possibly coming off vent today. 3L off w/ HD yesterday, bp's were stable on the m  Vitals:   12/08/17 0854 12/08/17 0900 12/08/17 1000 12/08/17 1100  BP: 116/89 116/89 120/84 108/85  Pulse: 87 87 94 85  Resp: (!) '21 14 18 18  ' Temp:      TempSrc:      SpO2: 100% 100% 100% 100%  Weight:      Height:        Inpatient medications: . chlorhexidine gluconate (MEDLINE KIT)  15 mL Mouth Rinse BID  . Chlorhexidine Gluconate Cloth  6 each Topical Q0600  . darbepoetin (ARANESP) injection - DIALYSIS  60 mcg Intravenous Q Thu-HD  . famotidine  20 mg Per Tube Daily  . fentaNYL (SUBLIMAZE) injection  50 mcg Intravenous Once  . insulin aspart  2-6 Units Subcutaneous Q4H  . mouth rinse  15 mL Mouth Rinse 10 times per day  . mupirocin ointment  1 application Nasal BID   . sodium chloride    . amiodarone 30 mg/hr (12/08/17 1100)  . dextrose 30 mL/hr at 12/08/17 1100  . fentaNYL infusion INTRAVENOUS Stopped (12/08/17 0856)   sodium chloride, acetaminophen (TYLENOL) oral liquid 160 mg/5 mL **OR** acetaminophen, docusate, fentaNYL, heparin, heparin, lidocaine-prilocaine, midazolam, midazolam, pentafluoroprop-tetrafluoroeth    Home meds:  - coumadin 51m hs/ lipitor 10 mg hs  - wellbutrin 1043mqd/ baclofen 5 mg tid prn/ percocet prn/ desyrel 50 hs  - protonix 40 qd/ sl ntg prn/ mvi/ allopurinol 300 qd   Exam: Gen frail older adult male on vent, responsive No rash, cyanosis or gangrene Sclera anicteric, throat w ETT  No jvd or bruits Chest clear ant/ lat, no wheezing RRR loud 3/6 holo syst M, no RG Abd soft ntnd no mass or ascites +bs, LLQ colostomy GU normal male w condom cath MS R knee is warm and ++effusion, L knee normal Ext 1-2+ bilat LE edema Neuro is alert, Ox 3 , nf, moves all ext   CXR 7/12 > crowding lung vessels, no gross edema   Dialysis:  [facility] (recent start  4h   400/800  79kg   3K/  2Ca bath  Hep 2700  R IJ TDC - mircera 75 every 2 wks last 6/27   Impression: 1  VTach - sp defib/ near cardiac arrest; on IV amio 2  Afib - on coumadin at home. In NSR here.  3  HFrEF - EF 30%, new echo pending results 4  ESRD - on HD TTS 5  Volume - at dry wt, will lower dry wt here as tolerated 6  Hx colon Ca - sp colostomy 7  Depression 8  Gout 9  Anemia ckd - Hb 9's, follow, due for esa soon   Exam: Gen frail older adult male, on vent, awake Sclera anicteric, throat w ETT  No jvd or bruits Chest clear ant/ lat, no wheezing RRR loud 3/6 holo syst M, no RG Abd soft ntnd no mass or ascites +bs, LLQ colostomy GU normal male w condom cath Ext 1+  bilat pitting LE edema Neuro is alert, Ox 3 , nf, moves all ext    Home meds:  - coumadin 38m51ms/ lipitor 10 mg hs  - wellbutrin 100m14m/ baclofen 5 mg tid prn/ percocet prn/ desyrel 50 hs  - protonix 40 qd/ sl ntg prn/ mvi/ allopurinol 300 qd  Dialysis:  [facility] (recent start  4h  400/800  79kg   3K/ 2Ca bath  Hep 2700  R IJ TDC - mircera 75 every 2 wks last 6/27   Impression: 1  VTach - sp defib/ near cardiac arrest; on IV amio, echo pending 2  Afib - on coumadin 3  HFrEF - EF 30% 4  ESRD - on HD TTS 5  Volume - up 3kg today, vasc congestion on cxr 6  Hx colon Ca - sp colostomy 7  Depression 8  Gout 9  Anemia ckd - Hb 8.8, give darbe 60ug w/ HD Sat    Plan - HD tomorrow, esa, lower dry wt   Kelly Splinter MD [place] pager [phone]   12/08/2017, 11:53 AM   Recent Labs  Lab 12/07/17 0206  12/07/17 0646 12/08/17 0154  NA 139   < > 140 139  K 4.6   < > 3.6 3.3*  CL 104   < > 101 103  CO2 16*  --  20* 25  GLUCOSE 156*   < > 92 77  BUN 53*   < > 54* 30*  CREATININE 5.00*   < > 5.37* 3.75*  CALCIUM 8.1*  --  8.3* 8.7*  PHOS  --   --  5.2*  --   ALBUMIN 2.3*  --   --   --   INR 3.02  --   --  2.80   < > = values in this interval not displayed.   Recent Labs  Lab  12/07/17 0206  AST 67*  ALT 25  ALKPHOS 166*  BILITOT 1.2  PROT 6.4*   Recent Labs  Lab 12/07/17 0206  12/07/17 0646 12/08/17 0154  WBC 6.1  --  6.6 4.9  NEUTROABS 4.7  --   --  3.4  HGB 9.4*   < > 8.7* 8.8*  HCT 32.1*   < > 29.0* 29.1*  MCV 89.9  --  89.0 87.9  PLT 157  --  124* 136*   < > = values in this interval not displayed.   Iron/TIBC/Ferritin/ %Sat    Component Value Date/Time   IRON 45 10/11/2017 0920   IRON 29 (L) 10/19/2016 1039   TIBC 148 (L) 10/11/2017 0920   TIBC 191 (L) 10/19/2016 1039   FERRITIN 686 (H) 10/11/2017 0920   FERRITIN 125 12/22/2016 1446   IRONPCTSAT 30 10/11/2017 0920   IRONPCTSAT 15 (L) 10/19/2016 1039

## 2017-12-08 NOTE — Consult Note (Addendum)
Cardiology Consultation:   Patient ID: Patrick [name]; [id number]; 1950/07/14   Admit date: 12/07/2017 Date of Consult: 12/08/2017  Primary Care Provider: Dr Deforest Hoyles Primary Cardiologist:  Minus Breeding, MD, 08/29/2017 Primary Electrophysiologist:  None   Patient Profile:   Patrick [name] is a 67 y.o. 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 No med changes.  Patrick [name] was admitted 07/11 from SNF after he was found on the floor. EMS noted VT when putting him on the stretcher. Adenosine x 3 doses>>unresponsive>>defib. Intubated in the ER.   He is being seen today for the evaluation of arrhythmia at the request of Dr Elwyn Reach.  History of Present Illness:   Patrick [name] is awake and alert on the vent.  He nods his head to simple questions.  He has not had any chest pain recently.    He denies any shortness of breath.  He has had palpitations, had some yesterday.  He has had some episodes of being lightheaded or dizzy, none yesterday.  He does not know how he ended up on the floor.  He does not remember much about coming to the hospital.  He has never passed out before.  He is resting comfortably now, he is looking forward to getting the ET tube out.  No family is present, other information was obtained from chart notes and staff.   Past Medical History:  Diagnosis Date  . Arthritis    "right hand; right knee" (06/19/2014)  . CAD (coronary artery disease)    2v CAD with subtotal occlusion of small co-dominant RCA and borderline lesion in moderate-sized OM-1  . CHF (congestive heart failure) (HCC)    Preserved EF  . Coronary artery disease involving native coronary artery of native  heart with angina pectoris ([place]) 09/11/2016  . Degenerative joint disease of knee, right   . Dysrhythmia   . Enlarged prostate   . Hyperlipidemia   . Hypertension   . Pneumonia 05/2014  . Prediabetes 10/03/2014  . Scrotal edema 04/03/2015  . SMALL BOWEL OBSTRUCTION, HX OF 08/21/2007   Annotation: with narrowing in the ileocecal region Qualifier: Diagnosis of  By: Hassell Done FNP, Tori Milks      Past Surgical History:  Procedure Laterality Date  . CARDIAC CATHETERIZATION N/A 02/16/2016   Procedure: Right/Left Heart Cath and Coronary Angiography;  Surgeon: Jolaine Artist, MD;  Location: [facility];  Service: Cardiovascular;  Laterality: N/A;  . COLONOSCOPY N/A 04/01/2016   Procedure: COLONOSCOPY;  Surgeon: Leighton Ruff, MD;  Location: [facility];  Service: Endoscopy;  Laterality: N/A;  . INGUINAL HERNIA REPAIR Left 1990's  . IR FLUORO GUIDE CV LINE RIGHT  10/18/2017  . IR GENERIC HISTORICAL  07/05/2016   IR RADIOLOGIST EVAL & MGMT 07/05/2016 Corrie Mckusick, DO [facility] INTER[facility] RAD  . IR RADIOLOGIST EVAL & MGMT  03/29/2017  . IR US GUIDE VASC ACCESS RIGHT  10/18/2017  . XI ROBOT ABDOMINAL PERINEAL RESECTION N/A 09/28/2016   Procedure: XI ROBOT ABDOMINOPERINEAL RESECTION WITH PERMANENT COLOSTOMY ERAS PATHWAY;  Surgeon: Michael Boston, MD;  Location: [facility];  Service: General;  Laterality: N/A;     Prior to Admission medications   Medication Sig Start Date  End Date Taking? Authorizing Provider  acetaminophen (TYLENOL) 500 MG tablet Take 1,000 mg by mouth every 6 (six) hours as needed (for pain/fever/headaches.).    Yes [provider]  allopurinol (ZYLOPRIM) 300 MG tablet take 1 tablet by mouth once daily 04/03/17  Yes Mayo, Pete Pelt, MD  Amino Acids-Protein Hydrolys (FEEDING SUPPLEMENT, PRO-STAT SUGAR FREE 64,) LIQD Take 30 mLs by mouth 3 (three) times daily with meals.   Yes [provider]  atorvastatin (LIPITOR) 10 MG tablet Take 10 mg by mouth at bedtime.  08/19/17  Yes [provider]  Baclofen 5 MG TABS Take 5 mg by mouth 3 (three) times daily as needed for muscle spasms. 10/27/17  Yes Bonnielee Haff, MD  buPROPion [facility]) 100 MG tablet take 1 tablet by mouth twice a day 03/14/17  Yes Mikell, Jeani Sow, MD  Melatonin 5 MG TABS Take 5 mg by mouth at bedtime.   Yes [provider]  Multiple Vitamins-Minerals (MULTIVITAMIN WITH MINERALS) tablet Take 1 tablet by mouth daily. Men's 50+ Multivitamin   Yes [provider]  nitroGLYCERIN (NITROSTAT) 0.4 MG SL tablet place 1 tablet under the tongue if needed every 5 minutes for chest pain for 3 doses IF NO RELIEF AFTER 1ST DOSE CALL 911. 05/10/17  Yes Mayo, Pete Pelt, MD  oxyCODONE-acetaminophen (PERCOCET/ROXICET) 5-325 MG tablet Take 1 tablet by mouth every 6 (six) hours as needed for severe pain. 10/27/17  Yes Bonnielee Haff, MD  pantoprazole (PROTONIX) 40 MG tablet Take 1 tablet (40 mg total) by mouth daily. 10/27/17  Yes Bonnielee Haff, MD  traZODone (DESYREL) 50 MG tablet Take 1 tablet (50 mg total) by mouth at bedtime as needed for sleep. 10/27/17  Yes Bonnielee Haff, MD  warfarin (COUMADIN) 2 MG tablet 13m every evening. Check INR on 6/4. Adjust depending on INR. Goal INR: 2-3. Patient taking differently: Take 2 mg by mouth daily at 6 PM.  10/27/17  Yes KBonnielee Haff MD  Nutritional Supplements (FEEDING SUPPLEMENT, NEPRO CARB STEADY,) LIQD Take 237 mLs by mouth 3 (three) times daily between meals. Patient not taking: Reported on 12/07/2017 10/27/17   KBonnielee Haff MD    Inpatient Medications: Scheduled Meds: . chlorhexidine gluconate (MEDLINE KIT)  15 mL Mouth Rinse BID  . Chlorhexidine Gluconate Cloth  6 each Topical Q0600  . darbepoetin (ARANESP) injection - DIALYSIS  60 mcg Intravenous Q Thu-HD  . famotidine  20 mg Per Tube Daily  . fentaNYL (SUBLIMAZE) injection  50 mcg Intravenous Once  . insulin aspart  2-6 Units Subcutaneous Q4H  . mouth rinse  15 mL Mouth Rinse 10 times per  day  . mupirocin ointment  1 application Nasal BID   Continuous Infusions: . sodium chloride    . amiodarone 30 mg/hr (12/08/17 1100)  . dextrose 30 mL/hr at 12/08/17 1100  . fentaNYL infusion INTRAVENOUS Stopped (12/08/17 0856)   PRN Meds: sodium chloride, acetaminophen (TYLENOL) oral liquid 160 mg/5 mL **OR** acetaminophen, docusate, fentaNYL, heparin, heparin, lidocaine-prilocaine, midazolam, midazolam, pentafluoroprop-tetrafluoroeth  Allergies:    Allergies  Allergen Reactions  . Nsaids     Kidney disease    Social History:   Social History   Socioeconomic History  . Marital status: Single    Spouse name: Not on file  . Number of children: Not on file  . Years of education: Not on file  . Highest education level: Not on file  Occupational History  . Occupation: Retired  SScientific laboratory technician . Financial resource strain: Not  on file  . Food insecurity:    Worry: Not on file    Inability: Not on file  . Transportation needs:    Medical: Not on file    Non-medical: Not on file  Tobacco Use  . Smoking status: Former Smoker    Years: 5.00    Types: Pipe, Landscape architect  . Smokeless tobacco: Never Used  . Tobacco comment: 06/19/2014 "stopped smoking in ~ 2014; used to smoke a pipe or cigar a couple times/month"  Substance and Sexual Activity  . Alcohol use: No  . Drug use: No  . Sexual activity: Never  Lifestyle  . Physical activity:    Days per week: Not on file    Minutes per session: Not on file  . Stress: Not on file  Relationships  . Social connections:    Talks on phone: Not on file    Gets together: Not on file    Attends religious service: Not on file    Active member of club or organization: Not on file    Attends meetings of clubs or organizations: Not on file    Relationship status: Not on file  . Intimate partner violence:    Fear of current or ex partner: Not on file    Emotionally abused: Not on file    Physically abused: Not on file    Forced sexual  activity: Not on file  Other Topics Concern  . Not on file  Social History Narrative   Single, lives in a facility. His sister lives locally, but is homebound.     Family History:   Family History  Problem Relation Age of Onset  . Hypertension Mother   . Diabetes Mother   . Stroke Mother   . Cancer Father 34       Prostate  . Dementia Father   . COPD Sister   . Arthritis Sister   . Edema Sister    Family Status:  Family Status  Relation Name Status  . Mother  Deceased  . Father  Deceased  . Sister  Alive  . MGM  Deceased  . MGF  Deceased  . PGM  Deceased  . PGF  Deceased    ROS:  Please see the history of present illness.  All other ROS reviewed and negative.     Physical Exam/Data:   Vitals:   12/08/17 0854 12/08/17 0900 12/08/17 1000 12/08/17 1100  BP: 116/89 116/89 120/84 108/85  Pulse: 87 87 94 85  Resp: (!) _0 Temp:      TempSrc:      SpO2: 100% 100% 100% 100%  Weight:      Height:        Intake/Output Summary (Last 24 hours) at 12/08/2017 1135 Last data filed at 12/08/2017 1100 Gross per 24 hour  Intake 879.85 ml  Output 3651 ml  Net -2771.15 ml   Filed Weights   12/07/17 0152 12/07/17 1215 12/07/17 1545  Weight: 182 lb (82.6 kg) 182 lb 12.2 oz (82.9 kg) 172 lb 13.5 oz (78.4 kg)   Body mass index is 27.07 kg/m.  General:  Well nourished, well developed, in no acute distress HEENT: normal Lymph: no adenopathy Neck: no JVD seen but difficult to assess secondary to lines and equipment Endocrine:  No thryomegaly Vascular: No carotid bruits; 4/4 extremity pulses 1-2+ Cardiac:  normal S1, S2; RRR; no murmur  Lungs:  clear to auscultation bilaterally, no wheezing, rhonchi or rales  Abd: soft, nontender,  no hepatomegaly  Ext: no edema Musculoskeletal:  No deformities, BUE and BLE strength weak but equal Skin: warm and dry  Psych:  Normal affect   EKG:  The EKG was personally reviewed and demonstrates:  07/11, Accelerated Junctional vs  SR, HR 99, RBBB and ?LAFB,  Telemetry:  Telemetry was personally reviewed and demonstrates: Some sinus beats, accelerated junctional versus atrial, nonsustained VT less than 10 beats, several runs.  Relevant CV Studies:  ECHO: Performed yesterday>> results pending  ECHO: 10/07/2017 - Left ventricle: Diffuse hypokineis worse in inferior wall with   abnromal septal motion Wall thickness was increased in a pattern   of mild LVH. Systolic function was moderately reduced. The   estimated ejection fraction was in the range of 35% to 40%. The   study is not technically sufficient to allow evaluation of LV   diastolic function. - Mitral valve: There was mild regurgitation. - Left atrium: The atrium was severely dilated. - Right ventricle: The cavity size was moderately dilated. - Right atrium: The atrium was moderately dilated. - Atrial septum: No defect or patent foramen ovale was identified. - Tricuspid valve: There was moderate regurgitation.  CATH: 02/16/2016 Findings: RA = 10 RV = 69/13 PA = 62/23 (36) PCW = 23 Fick cardiac output/index = 4.9/2.4 PVR = 2.6 WU SVR = 1363 FA sat = 97% PA sat = 64%, 64% Assessment: 1. 2v CAD with subtotal occlusion of small co-dominant RCA and borderline lesion in moderate-sized OM-1 2. 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 Normal cardiac output Plan/Discussion: Continue aggressive medical therapy. Consider switching losartan to Entresto and sleep study. Diagnostic Diagram       Laboratory Data:  Chemistry Recent Labs  Lab 12/07/17 0206 12/07/17 0224 12/07/17 0646 12/08/17 0154  NA 139 140 140 139  K 4.6 4.4 3.6 3.3*  CL 104 104 101 103  CO2 16*  --  20* 25  GLUCOSE 156* 149* 92 77  BUN 53* 62* 54* 30*  CREATININE 5.00* 4.80* 5.37* 3.75*  CALCIUM 8.1*  --  8.3* 8.7*  GFRNONAA 11*  --  10* 15*  GFRAA 12*  --  11* 18*  ANIONGAP 19*  --  19*  11    Lab Results  Component Value Date   ALT 25 12/07/2017   AST 67 (H) 12/07/2017   ALKPHOS 166 (H) 12/07/2017   BILITOT 1.2 12/07/2017   Hematology Recent Labs  Lab 12/07/17 0206 12/07/17 0224 12/07/17 0646 12/08/17 0154  WBC 6.1  --  6.6 4.9  RBC 3.57*  --  3.26* 3.31*  HGB 9.4* 10.9* 8.7* 8.8*  HCT 32.1* 32.0* 29.0* 29.1*  MCV 89.9  --  89.0 87.9  MCH 26.3  --  26.7 26.6  MCHC 29.3*  --  30.0 30.2  RDW 19.4*  --  19.1* 19.3*  PLT 157  --  124* 136*   Cardiac Enzymes Recent Labs  Lab 12/07/17 0646 12/07/17 1412 12/07/17 2123  TROPONINI 0.65* 1.71* 1.91*    Recent Labs  Lab 12/07/17 0223  TROPIPOC 0.10*    Magnesium:  Magnesium  Date Value Ref Range Status  12/07/2017 1.9 1.7 - 2.4 mg/dL Final    Comment:    Performed at [facility], [place] [address] [place], [place] [address]     Radiology/Studies:  Ct Head Wo Contrast  Result Date: 12/07/2017 CLINICAL DATA:  Found down at side of bed, cardioverted. History  of hypertension, hyperlipidemia. EXAM: CT HEAD WITHOUT CONTRAST CT CERVICAL SPINE WITHOUT CONTRAST TECHNIQUE: Multidetector CT imaging of the head and cervical spine was performed following the standard protocol without intravenous contrast. Multiplanar CT image reconstructions of the cervical spine were also generated. COMPARISON:  CT HEAD June 01, 2014 FINDINGS: CT HEAD FINDINGS BRAIN: No intraparenchymal hemorrhage, mass effect nor midline shift. Moderate parenchymal brain volume loss. No hydrocephalus. Patchy supratentorial white matter hypodensities. No acute large vascular territory infarcts. No abnormal extra-axial fluid collections. Basal cisterns are patent. VASCULAR: Moderate calcific atherosclerosis of the carotid siphons. SKULL: No skull fracture. No significant scalp soft tissue swelling. Focal calcified parietal convexity tricholemmal cyst. SINUSES/ORBITS: Mild-to-moderate paranasal sinus mucosal thickening. Mastoid air cells are well  aerated.The included ocular globes and orbital contents are non-suspicious. OTHER: Life-support lines in place. CT CERVICAL SPINE FINDINGS ALIGNMENT: Straightened lordosis.  Vertebral bodies in alignment. SKULL BASE AND VERTEBRAE: Cervical vertebral bodies and posterior elements are intact. Moderate to severe C3-4 through C6-7 disc height loss with endplate spurring compatible with degenerative discs, moderate C2-3. C1-2 articulation maintained with moderate arthropathy. No destructive bony lesions. SOFT TISSUES AND SPINAL CANAL: Nonacute. Mild calcific atherosclerosis carotid bifurcations. DISC LEVELS: Severe LEFT C3-4, RIGHT C4-5 neural foraminal narrowing. Mild canal stenosis C6-7. UPPER CHEST: Lung apices are clear. OTHER: Life-support lines in place. IMPRESSION: CT HEAD: 1. No acute intracranial process. 2. Moderate parenchymal brain volume loss. 3. Moderate chronic small vessel ischemic changes. CT CERVICAL SPINE: 1. No fracture or malalignment. Electronically Signed   By: Elon Alas M.D.   On: 12/07/2017 03:20   Ct Cervical Spine Wo Contrast  Result Date: 12/07/2017 CLINICAL DATA:  Found down at side of bed, cardioverted. History of hypertension, hyperlipidemia. EXAM: CT HEAD WITHOUT CONTRAST CT CERVICAL SPINE WITHOUT CONTRAST TECHNIQUE: Multidetector CT imaging of the head and cervical spine was performed following the standard protocol without intravenous contrast. Multiplanar CT image reconstructions of the cervical spine were also generated. COMPARISON:  CT HEAD June 01, 2014 FINDINGS: CT HEAD FINDINGS BRAIN: No intraparenchymal hemorrhage, mass effect nor midline shift. Moderate parenchymal brain volume loss. No hydrocephalus. Patchy supratentorial white matter hypodensities. No acute large vascular territory infarcts. No abnormal extra-axial fluid collections. Basal cisterns are patent. VASCULAR: Moderate calcific atherosclerosis of the carotid siphons. SKULL: No skull fracture. No  significant scalp soft tissue swelling. Focal calcified parietal convexity tricholemmal cyst. SINUSES/ORBITS: Mild-to-moderate paranasal sinus mucosal thickening. Mastoid air cells are well aerated.The included ocular globes and orbital contents are non-suspicious. OTHER: Life-support lines in place. CT CERVICAL SPINE FINDINGS ALIGNMENT: Straightened lordosis.  Vertebral bodies in alignment. SKULL BASE AND VERTEBRAE: Cervical vertebral bodies and posterior elements are intact. Moderate to severe C3-4 through C6-7 disc height loss with endplate spurring compatible with degenerative discs, moderate C2-3. C1-2 articulation maintained with moderate arthropathy. No destructive bony lesions. SOFT TISSUES AND SPINAL CANAL: Nonacute. Mild calcific atherosclerosis carotid bifurcations. DISC LEVELS: Severe LEFT C3-4, RIGHT C4-5 neural foraminal narrowing. Mild canal stenosis C6-7. UPPER CHEST: Lung apices are clear. OTHER: Life-support lines in place. IMPRESSION: CT HEAD: 1. No acute intracranial process. 2. Moderate parenchymal brain volume loss. 3. Moderate chronic small vessel ischemic changes. CT CERVICAL SPINE: 1. No fracture or malalignment. Electronically Signed   By: Elon Alas M.D.   On: 12/07/2017 03:20   Dg Chest Port 1 View  Result Date: 12/08/2017 CLINICAL DATA:  Respiratory failure, endotracheal tube present. EXAM: PORTABLE CHEST 1 VIEW COMPARISON:  Radiograph December 07, 2017. FINDINGS: Stable cardiomegaly. Endotracheal and nasogastric  tubes are unchanged in position. Right internal jugular dialysis catheter is unchanged. Stable mild right basilar subsegmental atelectasis is noted. Stable left basilar opacity is noted concerning for atelectasis with associated pleural effusion. No pneumothorax is noted. Bony thorax is unremarkable. IMPRESSION: Stable support apparatus. Stable bibasilar opacities, left greater than right, as described above. Electronically Signed   By: Marijo Conception, M.D.   On:  12/08/2017 08:51   Dg Chest Port 1 View  Result Date: 12/07/2017 CLINICAL DATA:  Intubation. EXAM: PORTABLE CHEST 1 VIEW COMPARISON:  Chest radiograph Oct 23, 2017 FINDINGS: Endotracheal tube tip projects 16 mm above the carina. Nasogastric tube looped in proximal stomach, distal tip projecting at GE junction. Multiple EKG lines overlie the patient and may obscure subtle underlying pathology. Tunneled dialysis catheter via RIGHT internal jugular venous approach with distal tip projecting in RIGHT atrium. Cardiomegaly and pulmonary vascular congestion. Low inspiratory examination with crowded vascular markings. Potential LEFT pleural effusion. RIGHT costophrenic angle out of field of view. No pneumothorax. Soft tissue planes and included osseous structures are non suspicious. IMPRESSION: Endotracheal tube tip projects 16 mm above the carina. Nasogastric tube looped in proximal stomach, distal tip projecting at GE junction. Cardiomegaly and pulmonary vascular congestion. Electronically Signed   By: Elon Alas M.D.   On: 12/07/2017 02:24    Assessment and Plan:   Active Problems: 1.  Acute respiratory failure with hypoxia ([place]) -Management per CCM, patient is for extubation today  2.  VT (ventricular tachycardia) ([place]) - Patient potassium is 3.3 today, management per renal team but would like to keep the potassium levels closer to 4. - Magnesium level 1.9, it needs to be this or a little higher  3.  Chronic systolic CHF - No volume significant overload on exam, volume management with dialysis  4.  CAD: -RCA is known to be subtotally occluded with moderate disease in the OM. -Last cath was 2017 - MD advise if repeat cath needed.  5.  Cardiomyopathy: - Feel this is a nonischemic cardiomyopathy with reduced EF portion to his coronary artery disease - MD advise if he is a candidate for an ICD, if one is indicated -May need EP to see regarding the VT anyway.  For questions or updates,  please contact [place] Please consult [email or web address] for contact info under Cardiology/STEMI.   SignedRosaria Ferries, PA-C  12/08/2017 11:35 AM

## 2017-12-08 NOTE — Telephone Encounter (Signed)
Returned call to patient's sister. Patient is in hospital, VT arrested yesterday per notes. She states she has been in communication with nurse at hospital but is not able to be there with patient as she is homebound. She states they are thinking of doing a cath to see if there are blockages, wanted to know if this is "standard" procedure. Advised this is a normal procedure if there are concerns that there may be decreased blood flow to heart muscle. Explained that MD is out of the office but suggested she be in communication with patient's hospital nurse for real time updates on his status and plan of care.   Routed to MD as Juluis Rainier

## 2017-12-08 NOTE — H&P (Signed)
PULMONARY / CRITICAL CARE MEDICINE   Name: Patrick [name] MRN: [id number] DOB: [birth year]    ADMISSION DATE:  12/07/2017 CONSULTATION DATE: 12/07/17  Interval Hx: Remains intubated. Awake alert following commands. He had a session of HD yesterday did well.     REFERRING MD: Dr Betsey Holiday  CHIEF COMPLAINT: VT  HISTORY OF PRESENT ILLNESS:   [id number] with CHF (EF 30%), CAD, HTN, DM, OA, ESRD on HD (started HD on 10/09/17), Colon cancer s/p colostomy (2018), and Afib (on coumadin), presents to the ER following VT. He lives in a nursing home, where tonight he was found down on the floor beside his bed. Initially he was unresponsive but then woke up somewhat and c/o SOB. EMS was called and on moving him from the floor to the stretcher, patient developed VT. He was given Adenosine 6mg ,12mg ,12mg . Patient then became unresponsive and hypotensive and was defibrillated. He was then transferred to the ER where he was emergently intubated. At time of my exam patient is intubated but awake and calm. He does not remember any of the incident. He denies any CP or pain anywhere else. He has 1+ LLE edema but he says this is chronic. Discussed code status with him, and he nods that he is full code and would want defibrillation and cpr if needed.   PAST MEDICAL HISTORY :  He  has a past medical history of Arthritis, CAD (coronary artery disease), CHF (congestive heart failure) ([place]), Coronary artery disease involving native coronary artery of native heart with angina pectoris ([place]) (09/11/2016), Degenerative joint disease of knee, right, Dysrhythmia, Enlarged prostate, Hyperlipidemia, Hypertension, Pneumonia (05/2014), Prediabetes (10/03/2014), Scrotal edema (04/03/2015), and SMALL BOWEL OBSTRUCTION, HX OF (08/21/2007).  PAST SURGICAL HISTORY: He  has a past surgical history that includes Inguinal hernia repair (Left, 1990's); Cardiac catheterization (N/A, 02/16/2016); Colonoscopy (N/A, 04/01/2016); ir generic historical (07/05/2016);  XI abdominal perineal resection (N/A, 09/28/2016); IR Radiologist Eval & Mgmt (03/29/2017); IR Fluoro Guide CV Line Right (10/18/2017); and IR US Guide Vasc Access Right (10/18/2017).  Allergies  Allergen Reactions  . Nsaids     Kidney disease   No current facility-administered medications on file prior to encounter.    Current Outpatient Medications on File Prior to Encounter  Medication Sig  . acetaminophen (TYLENOL) 500 MG tablet Take 1,000 mg by mouth every 6 (six) hours as needed (for pain/fever/headaches.).   Marland Kitchen allopurinol (ZYLOPRIM) 300 MG tablet take 1 tablet by mouth once daily  . Amino Acids-Protein Hydrolys (FEEDING SUPPLEMENT, PRO-STAT SUGAR FREE 64,) LIQD Take 30 mLs by mouth 3 (three) times daily with meals.  Marland Kitchen atorvastatin (LIPITOR) 10 MG tablet Take 10 mg by mouth at bedtime.   . Baclofen 5 MG TABS Take 5 mg by mouth 3 (three) times daily as needed for muscle spasms.  Marland Kitchen buPROPion (WELLBUTRIN) 100 MG tablet take 1 tablet by mouth twice a day  . Melatonin 5 MG TABS Take 5 mg by mouth at bedtime.  . Multiple Vitamins-Minerals (MULTIVITAMIN WITH MINERALS) tablet Take 1 tablet by mouth daily. Men's 50+ Multivitamin  . nitroGLYCERIN (NITROSTAT) 0.4 MG SL tablet place 1 tablet under the tongue if needed every 5 minutes for chest pain for 3 doses IF NO RELIEF AFTER 1ST DOSE CALL 911.  Marland Kitchen oxyCODONE-acetaminophen (PERCOCET/ROXICET) 5-325 MG tablet Take 1 tablet by mouth every 6 (six) hours as needed for severe pain.  . pantoprazole (PROTONIX) 40 MG tablet Take 1 tablet (40 mg total) by mouth daily.  . traZODone (DESYREL) 50 MG  tablet Take 1 tablet (50 mg total) by mouth at bedtime as needed for sleep.  Marland Kitchen warfarin (COUMADIN) 2 MG tablet 2mg  every evening. Check INR on 6/4. Adjust depending on INR. Goal INR: 2-3. (Patient taking differently: Take 2 mg by mouth daily at 6 PM. )  . Nutritional Supplements (FEEDING SUPPLEMENT, NEPRO CARB STEADY,) LIQD Take 237 mLs by mouth 3 (three) times daily  between meals. (Patient not taking: Reported on 12/07/2017)   FAMILY HISTORY:  His family history includes Arthritis in his sister; COPD in his sister; Cancer (age of onset: 80) in his father; Dementia in his father; Diabetes in his mother; Edema in his sister; Hypertension in his mother; Stroke in his mother.  SOCIAL HISTORY: He  reports that he has quit smoking. His smoking use included pipe and cigars. He quit after 5.00 years of use. He has never used smokeless tobacco. He reports that he does not drink alcohol or use drugs.  REVIEW OF SYSTEMS:   Review of Systems  Unable to perform ROS: Critical illness   SUBJECTIVE:  Intubated and Sedated  VITAL SIGNS: BP 116/89   Pulse 87   Temp 98.4 F (36.9 C) (Oral)   Resp 14   Ht 5\' 7"  (1.702 m) Comment: measured at bedside by RT  Wt 78.4 kg (172 lb 13.5 oz)   SpO2 100%   BMI 27.07 kg/m   VENTILATOR SETTINGS: Vent Mode: PSV;CPAP FiO2 (%):  [40 %] 40 % Set Rate:  [18 bmp] 18 bmp Vt Set:  [530 mL] 530 mL PEEP:  [5 cmH20] 5 cmH20 Pressure Support:  [10 cmH20] 10 cmH20 Plateau Pressure:  [12 cmH20-1617 cmH20] 12 cmH20PRVC, 100% FIO2, 5 PEEP, 18 RR, Vt 530  INTAKE / OUTPUT: I/O last 3 completed shifts: In: 900.3 [I.V.:900.3] Out: 3796 [Urine:690; DXAJO:8786; Stool:150]  PHYSICAL EXAMINATION: General: WDWN Adult male, Intubated awake, Critically ill Neuro: RASS 0, Spontaneously awake and calm, PERRL, Obeying commands, Moving all extremities  HEENT: OP clear, MM moist, Orally intubated Cardiovascular: Irreg Irreg with HR 59 Lungs: CTA b/l Abdomen: Soft NTND, LLQ Ostomy  Musculoskeletal: 1+ LLE edema; no RLE edema  Skin: no rashes   LABS:  BMET Recent Labs  Lab 12/07/17 0206 12/07/17 0224 12/07/17 0646 12/08/17 0154  NA 139 140 140 139  K 4.6 4.4 3.6 3.3*  CL 104 104 101 103  CO2 16*  --  20* 25  BUN 53* 62* 54* 30*  CREATININE 5.00* 4.80* 5.37* 3.75*  GLUCOSE 156* 149* 92 77   Electrolytes Recent Labs  Lab  12/07/17 0206 12/07/17 0646 12/08/17 0154  CALCIUM 8.1* 8.3* 8.7*  MG 1.8 1.9  --   PHOS  --  5.2*  --    CBC Recent Labs  Lab 12/07/17 0206 12/07/17 0224 12/07/17 0646 12/08/17 0154  WBC 6.1  --  6.6 4.9  HGB 9.4* 10.9* 8.7* 8.8*  HCT 32.1* 32.0* 29.0* 29.1*  PLT 157  --  124* 136*   Coag's Recent Labs  Lab 12/07/17 0206 12/08/17 0154  INR 3.02 2.80   Sepsis Markers Recent Labs  Lab 12/07/17 0225 12/07/17 0407 12/07/17 0646 12/07/17 0948 12/08/17 0154  LATICACIDVEN 8.06*  --  2.9* 1.4  --   PROCALCITON  --  0.67  --   --  5.18   ABG Recent Labs  Lab 12/07/17 0417  PHART 7.380  PCO2ART 39.5  PO2ART 473.0*    Liver Enzymes Recent Labs  Lab 12/07/17 0206  AST 67*  ALT  25  ALKPHOS 166*  BILITOT 1.2  ALBUMIN 2.3*   Cardiac Enzymes Recent Labs  Lab 12/07/17 0646 12/07/17 1412 12/07/17 2123  TROPONINI 0.65* 1.71* 1.91*    Glucose Recent Labs  Lab 12/07/17 1727 12/07/17 1900 12/07/17 1951 12/07/17 2346 12/08/17 0043 12/08/17 0332  GLUCAP 72 75 85 62* 99 69*    Imaging Dg Chest Port 1 View  Result Date: 12/08/2017 CLINICAL DATA:  Respiratory failure, endotracheal tube present. EXAM: PORTABLE CHEST 1 VIEW COMPARISON:  Radiograph December 07, 2017. FINDINGS: Stable cardiomegaly. Endotracheal and nasogastric tubes are unchanged in position. Right internal jugular dialysis catheter is unchanged. Stable mild right basilar subsegmental atelectasis is noted. Stable left basilar opacity is noted concerning for atelectasis with associated pleural effusion. No pneumothorax is noted. Bony thorax is unremarkable. IMPRESSION: Stable support apparatus. Stable bibasilar opacities, left greater than right, as described above. Electronically Signed   By: Marijo Conception, M.D.   On: 12/08/2017 08:51   CULTURES: Blood cultures (7/11): pending UA (7/11): ordered Sputum culture (7/11): pending  ANTIBIOTICS: None   SIGNIFICANT EVENTS: 7/11: presented to ER  following unstable VT requiring defibrillation; intubated on ER arrival  LINES/TUBES: RIJ Tunneled HD Catheter (present on admission) 2-PIV's ETT 7/11>> OG tube 7/11>>  DISCUSSION: 67yoM with CHF (EF 30%), CAD, HTN, DM, OA, ESRD on HD (started HD on 10/09/17), Colon cancer s/p colostomy (2018), and Afib (on coumadin), presents to the ER following unstable VT requiring defibrillation and intubation.   ASSESSMENT / PLAN:  PULMONARY 1. Acute Hypoxic Respiratory Failure requiring mechanical ventilation: - CPAP today and possible extubation - VAP protocol    CARDIOVASCULAR 1. Ventricular Tachycardia; Hx CAD, CHF (EF 30%), HTN, Afib:  - unclear what caused his VTach ? Related to his low EF - has not seen by cardiology yet. Cards were called again - continue Amiodarone infusion - TTE to be followed up    RENAL 1. ESRD on HD: -HD done yesterday. He is on MWF schedule usually   GASTROINTESTINAL No active issues; NPO; GI prophylaxis Start diet when extubated   HEMATOLOGIC 1. Anemia: - no signs of bleeding ? Related to ESRD   INFECTIOUS 1. Lactic acidosis:  - most likely related to arrest   ENDOCRINE 1. DM: - NPO; SSI  NEUROLOGIC No active issues; Fentanyl for vent tolerance   FAMILY  - Updates: No family present in ER at time of my exam - Inter-disciplinary family meet or Palliative Care meeting due by: 12/13/17  32 minutes critical care time   Silver Huguenin , MD  Pulmonary and [place] Pager: [phone]  12/08/2017, 9:46 AM

## 2017-12-08 NOTE — Procedures (Signed)
Extubation Procedure Note  Patient Details:   Name: Patrick [name] DOB: Jun 17, 1950 MRN: [id number]   Airway Documentation:    Vent end date: (not recorded) Vent end time: (not recorded)   Evaluation  O2 sats: stable throughout Complications: No apparent complications Patient did tolerate procedure well. Bilateral Breath Sounds: Clear   Yes  Rudene Re 12/08/2017, 11:53 AM

## 2017-12-08 NOTE — Progress Notes (Signed)
Fentanyl drip 182ml flushed down sink. Witnessed by Robinette Haines, RN

## 2017-12-08 NOTE — Telephone Encounter (Signed)
New Message    Patient sister would like to speak with Dr Percival Spanish - her brother is in [facility] for heart related problems and she wants to get his opinion on the procedure they are wanting to do

## 2017-12-09 DIAGNOSIS — I5043 Acute on chronic combined systolic (congestive) and diastolic (congestive) heart failure: Secondary | ICD-10-CM

## 2017-12-09 DIAGNOSIS — I2583 Coronary atherosclerosis due to lipid rich plaque: Secondary | ICD-10-CM

## 2017-12-09 DIAGNOSIS — I2510 Atherosclerotic heart disease of native coronary artery without angina pectoris: Secondary | ICD-10-CM

## 2017-12-09 DIAGNOSIS — I214 Non-ST elevation (NSTEMI) myocardial infarction: Secondary | ICD-10-CM

## 2017-12-09 LAB — GLUCOSE, CAPILLARY
GLUCOSE-CAPILLARY: 77 mg/dL (ref 70–99)
GLUCOSE-CAPILLARY: 84 mg/dL (ref 70–99)
Glucose-Capillary: 65 mg/dL — ABNORMAL LOW (ref 70–99)
Glucose-Capillary: 97 mg/dL (ref 70–99)
Glucose-Capillary: 110 mg/dL — ABNORMAL HIGH (ref 70–99)
Glucose-Capillary: 96 mg/dL (ref 70–99)

## 2017-12-09 LAB — CBC WITH DIFFERENTIAL/PLATELET
Abs Immature Granulocytes: 0 10*3/uL (ref 0.0–0.1)
BASOS ABS: 0.1 10*3/uL (ref 0.0–0.1)
Basophils Relative: 1 %
EOS PCT: 0 %
Eosinophils Absolute: 0 10*3/uL (ref 0.0–0.7)
HCT: 31.6 % — ABNORMAL LOW (ref 39.0–52.0)
HEMOGLOBIN: 9.1 g/dL — AB (ref 13.0–17.0)
IMMATURE GRANULOCYTES: 1 %
LYMPHS ABS: 0.9 10*3/uL (ref 0.7–4.0)
LYMPHS PCT: 12 %
MCH: 26.5 pg (ref 26.0–34.0)
MCHC: 28.8 g/dL — AB (ref 30.0–36.0)
MCV: 91.9 fL (ref 78.0–100.0)
Monocytes Absolute: 1.1 10*3/uL — ABNORMAL HIGH (ref 0.1–1.0)
Monocytes Relative: 16 %
NEUTROS PCT: 70 %
Neutro Abs: 5.1 10*3/uL (ref 1.7–7.7)
Platelets: 133 10*3/uL — ABNORMAL LOW (ref 150–400)
RBC: 3.44 MIL/uL — AB (ref 4.22–5.81)
RDW: 19.6 % — AB (ref 11.5–15.5)
WBC: 7.2 10*3/uL (ref 4.0–10.5)

## 2017-12-09 LAB — PROTIME-INR
INR: 3.06
Prothrombin Time: 31.4 seconds — ABNORMAL HIGH (ref 11.4–15.2)

## 2017-12-09 LAB — BASIC METABOLIC PANEL
ANION GAP: 14 (ref 5–15)
BUN: 40 mg/dL — ABNORMAL HIGH (ref 8–23)
CALCIUM: 8.7 mg/dL — AB (ref 8.9–10.3)
CHLORIDE: 100 mmol/L (ref 98–111)
CO2: 20 mmol/L — ABNORMAL LOW (ref 22–32)
CREATININE: 5 mg/dL — AB (ref 0.61–1.24)
GFR calc non Af Amer: 11 mL/min — ABNORMAL LOW (ref >60)
GFR, EST AFRICAN AMERICAN: 12 mL/min — AB (ref >60)
Glucose, Bld: 84 mg/dL (ref 70–99)
Potassium: 4.4 mmol/L (ref 3.5–5.1)
SODIUM: 134 mmol/L — AB (ref 135–145)

## 2017-12-09 LAB — PROCALCITONIN: Procalcitonin: 4.75 ng/mL

## 2017-12-09 LAB — MAGNESIUM: Magnesium: 2.1 mg/dL (ref 1.7–2.4)

## 2017-12-09 MED ORDER — SODIUM CHLORIDE 0.9% FLUSH
3.0000 mL | Freq: Two times a day (BID) | INTRAVENOUS | Status: DC
  Administered 2017-12-09 – 2017-12-13 (×6): 3 mL via INTRAVENOUS

## 2017-12-09 MED ORDER — HEPARIN SODIUM (PORCINE) 1000 UNIT/ML DIALYSIS: 2700.0000 [IU] | Freq: Once | INTRAMUSCULAR | Status: DC

## 2017-12-09 MED ORDER — TRAZODONE HCL 50 MG PO TABS
50.0000 mg | ORAL_TABLET | Freq: Every day | ORAL | Status: DC
  Administered 2017-12-10 – 2017-12-14 (×6): 50 mg via ORAL
  Filled 2017-12-09 (×6): qty 1

## 2017-12-09 MED ORDER — SODIUM CHLORIDE 0.9 % IV SOLN: 250.0000 mL | INTRAVENOUS | Status: DC | PRN

## 2017-12-09 MED ORDER — ASPIRIN 81 MG PO CHEW
81.0000 mg | CHEWABLE_TABLET | ORAL | Status: AC
  Administered 2017-12-11: 81 mg via ORAL
  Filled 2017-12-09: qty 1

## 2017-12-09 MED ORDER — SODIUM CHLORIDE 0.9% FLUSH
3.0000 mL | INTRAVENOUS | Status: DC | PRN
  Administered 2017-12-09: 3 mL via INTRAVENOUS
  Filled 2017-12-09: qty 3

## 2017-12-09 MED ORDER — SODIUM CHLORIDE 0.9 % IV SOLN
INTRAVENOUS | Status: DC
  Administered 2017-12-12: 07:00:00 via INTRAVENOUS

## 2017-12-09 NOTE — Progress Notes (Signed)
[facility] Progress Note  Subjective: extubated yesterday. Seen by cardiology, hx CAD , planning heart cath on Monday.  Pt w/o complaints today.   Vitals:   12/09/17 0700 12/09/17 0746 12/09/17 0800 12/09/17 1000  BP: 115/82  (!) 107/94 101/77  Pulse: 64  87 73  Resp: (!) 21  (!) 24 16  Temp:  98.5 F (36.9 C)    TempSrc:  Oral    SpO2: 92%  94% 92%  Weight:      Height:        Inpatient medications: . amiodarone  400 mg Oral BID  . [START ON 12/10/2017] aspirin  81 mg Oral Pre-Cath  . Chlorhexidine Gluconate Cloth  6 each Topical Q0600  . Chlorhexidine Gluconate Cloth  6 each Topical Q0600  . darbepoetin (ARANESP) injection - DIALYSIS  60 mcg Intravenous Q Thu-HD  . famotidine  20 mg Per Tube Daily  . [START ON 12/10/2017] heparin  2,700 Units Dialysis Once in dialysis  . insulin aspart  2-6 Units Subcutaneous Q4H  . mupirocin ointment  1 application Nasal BID  . sodium chloride flush  3 mL Intravenous Q12H   . sodium chloride    . [START ON 12/11/2017] sodium chloride    . dextrose 30 mL/hr at 12/09/17 0942   sodium chloride, acetaminophen, docusate, heparin, heparin, sodium chloride flush    Home meds:  - coumadin 2mg  hs/ lipitor 10 mg hs  - wellbutrin 100mg  qd/ baclofen 5 mg tid prn/ percocet prn/ desyrel 50 hs  - protonix 40 qd/ sl ntg prn/ mvi/ allopurinol 300 qd   Exam: Gen frail older adult male, no distress No jvd or bruits Chest clear ant/ lat, no wheezing RRR loud 3/6 holo syst M, no RG Abd soft ntnd no mass or ascites +bs, LLQ colostomy MS R knee is warm and ++effusion, L knee normal Ext 1+ LE edema Neuro is alert, Ox 3 , nf, moves all ext   CXR 7/12 > crowding lung vessels, no gross edema   Dialysis:  [facility] (recent start  4h   400/800  79kg   3K/ 2Ca bath  Hep 2700  R IJ TDC - mircera 75 every 2 wks last 6/27   Impression: 1  VTach - sp defib/ near cardiac arrest; on IV amio. For heart cath Monday.  2  Afib - on  coumadin at home. In NSR here.  3  HFrEF - hx EF 30% in past > now EF low at 15- 20%.  Per cards 4  ESRD - on HD TTS 5  Volume - at dry wt now, some leg edema still; bp's soft 6  Hx colon Ca - sp colostomy 7  Depression 8  Gout 9  Anemia ckd - Hb 9's, will get darbe 60 ug w HD today  PLan - as above   Kelly Splinter MD [employer] pgr [phone]   12/09/2017, 12:18 PM     Recent Labs  Lab 12/07/17 0206  12/07/17 0646 12/08/17 0154 12/09/17 0307  NA 139   < > 140 139 134*  K 4.6   < > 3.6 3.3* 4.4  CL 104   < > 101 103 100  CO2 16*  --  20* 25 20*  GLUCOSE 156*   < > 92 77 84  BUN 53*   < > 54* 30* 40*  CREATININE 5.00*   < > 5.37* 3.75* 5.00*  CALCIUM 8.1*  --  8.3* 8.7* 8.7*  PHOS  --   --  5.2*  --   --   ALBUMIN 2.3*  --   --   --   --   INR 3.02  --   --  2.80 3.06   < > = values in this interval not displayed.   Recent Labs  Lab 12/07/17 0206  AST 67*  ALT 25  ALKPHOS 166*  BILITOT 1.2  PROT 6.4*   Recent Labs  Lab 12/08/17 0154 12/09/17 0307  WBC 4.9 7.2  NEUTROABS 3.4 5.1  HGB 8.8* 9.1*  HCT 29.1* 31.6*  MCV 87.9 91.9  PLT 136* 133*   Iron/TIBC/Ferritin/ %Sat    Component Value Date/Time   IRON 45 10/11/2017 0920   IRON 29 (L) 10/19/2016 1039   TIBC 148 (L) 10/11/2017 0920   TIBC 191 (L) 10/19/2016 1039   FERRITIN 686 (H) 10/11/2017 0920   FERRITIN 125 12/22/2016 1446   IRONPCTSAT 30 10/11/2017 0920   IRONPCTSAT 15 (L) 10/19/2016 1039

## 2017-12-09 NOTE — Plan of Care (Signed)
  Problem: Clinical Measurements: Goal: Respiratory complications will improve Outcome: Progressing Goal: Cardiovascular complication will be avoided Outcome: Progressing   Problem: Activity: Goal: Risk for activity intolerance will decrease Outcome: Progressing   Problem: Coping: Goal: Level of anxiety will decrease Outcome: Progressing   Problem: Elimination: Goal: Will not experience complications related to bowel motility Outcome: Progressing Goal: Will not experience complications related to urinary retention Outcome: Progressing   Problem: Pain Managment: Goal: General experience of comfort will improve Outcome: Progressing   Problem: Safety: Goal: Ability to remain free from injury will improve Outcome: Progressing   Problem: Skin Integrity: Goal: Risk for impaired skin integrity will decrease Outcome: Progressing   

## 2017-12-09 NOTE — Progress Notes (Signed)
ANTICOAGULATION CONSULT NOTE - Initial Consult  Pharmacy Consult for IV heparin once INR < 2 Indication: atrial fibrillation  Allergies  Allergen Reactions  . Nsaids     Kidney disease    Patient Measurements: Height: 5\' 7"  (170.2 cm)(measured at bedside by RT) Weight: (unable to weigh pt r/t error w/ bed) IBW/kg (Calculated) : 66.1 Heparin Dosing Weight: 82.6 kg  Vital Signs: Temp: 98.5 F (36.9 C) (07/13 0746) Temp Source: Oral (07/13 0746) BP: 107/94 (07/13 0800) Pulse Rate: 87 (07/13 0800)  Labs: Recent Labs    12/07/17 0206  12/07/17 0646 12/07/17 1412 12/07/17 2123 12/08/17 0154 12/09/17 0307  HGB 9.4*   < > 8.7*  --   --  8.8* 9.1*  HCT 32.1*   < > 29.0*  --   --  29.1* 31.6*  PLT 157  --  124*  --   --  136* 133*  LABPROT 31.0*  --   --   --   --  29.3* 31.4*  INR 3.02  --   --   --   --  2.80 3.06  CREATININE 5.00*   < > 5.37*  --   --  3.75* 5.00*  TROPONINI  --   --  0.65* 1.71* 1.91*  --   --    < > = values in this interval not displayed.    Estimated Creatinine Clearance: 13 mL/min (A) (by C-G formula based on SCr of 5 mg/dL (H)).   Medical History: Past Medical History:  Diagnosis Date  . Arthritis    "right hand; right knee" (06/19/2014)  . CAD (coronary artery disease)    2v CAD with subtotal occlusion of small co-dominant RCA and borderline lesion in moderate-sized OM-1  . Chronic systolic (congestive) heart failure ([place])    Preserved EF 2016, has been between 25% and 45% since then  . Coronary artery disease involving native coronary artery of native heart with angina pectoris ([place]) 09/11/2016  . Degenerative joint disease of knee, right   . Dysrhythmia   . Enlarged prostate   . Hyperlipidemia   . Hypertension   . Pneumonia 05/2014  . Prediabetes 10/03/2014  . Scrotal edema 04/03/2015  . SMALL BOWEL OBSTRUCTION, HX OF 08/21/2007   Annotation: with narrowing in the ileocecal region Qualifier: Diagnosis of  By: Hassell Done FNP, Tori Milks       Medications:  Scheduled:  . amiodarone  400 mg Oral BID  . [START ON 12/10/2017] aspirin  81 mg Oral Pre-Cath  . Chlorhexidine Gluconate Cloth  6 each Topical Q0600  . Chlorhexidine Gluconate Cloth  6 each Topical Q0600  . darbepoetin (ARANESP) injection - DIALYSIS  60 mcg Intravenous Q Thu-HD  . famotidine  20 mg Per Tube Daily  . [START ON 12/10/2017] heparin  2,700 Units Dialysis Once in dialysis  . insulin aspart  2-6 Units Subcutaneous Q4H  . mupirocin ointment  1 application Nasal BID  . sodium chloride flush  3 mL Intravenous Q12H    Assessment: 67 yo male admitted with VT.  On chronic Coumadin for afib with INR = 3 today.  Last PTA Coumadin dose recorded as 12/06/17.  No overt bleeding or complications noted.  INR slow to fall thus far.  Goal of Therapy:  Heparin level 0.3-0.7 units/ml Monitor platelets by anticoagulation protocol: Yes   Plan:  1. Start IV heparin per pharmacy once INR falls < 2. 2. Repeat INR in AM. 3. Could consider dose of vitamin K tomorrow if INR  still remains without downward trend.  Marguerite Olea, [facility] Clinical Pharmacist Phone [phone]  12/09/2017 10:06 AM

## 2017-12-09 NOTE — Progress Notes (Signed)
Progress Note  Patient Name: Patrick [name] Date of Encounter: 12/09/2017  Primary Cardiologist: Minus Breeding, MD   Subjective   Patrick [name] is a 67 y.o. 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 No med changes.  Mr Patrick [name] was admitted 07/11 from SNF after he was found on the floor. EMS noted VT when putting him on the stretcher. Adenosine x 3 doses>>unresponsive>>defib. Intubated in the ER.   He has been extubated, is alert and oriented.  He said no recurrent chest pain.  His troponins peaked at 1.9.  He is scheduled for diagnostic coronary angiography on Monday.   Inpatient Medications    Scheduled Meds: . amiodarone  400 mg Oral BID  . [START ON 12/10/2017] aspirin  81 mg Oral Pre-Cath  . Chlorhexidine Gluconate Cloth  6 each Topical Q0600  . Chlorhexidine Gluconate Cloth  6 each Topical Q0600  . darbepoetin (ARANESP) injection - DIALYSIS  60 mcg Intravenous Q Thu-HD  . famotidine  20 mg Per Tube Daily  . [START ON 12/10/2017] heparin  2,700 Units Dialysis Once in dialysis  . insulin aspart  2-6 Units Subcutaneous Q4H  . mupirocin ointment  1 application Nasal BID  . sodium chloride flush  3 mL Intravenous Q12H   Continuous Infusions: . sodium chloride    . sodium chloride    . [START ON 12/11/2017] sodium chloride    . dextrose 30 mL/hr at 12/09/17 0700  . fentaNYL infusion INTRAVENOUS Stopped (12/08/17 0856)   PRN Meds: sodium chloride, sodium chloride, acetaminophen, docusate, heparin, heparin, lidocaine-prilocaine, pentafluoroprop-tetrafluoroeth, sodium chloride flush   Vital Signs    Vitals:   12/09/17 0600 12/09/17 0700 12/09/17 0746 12/09/17 0800  BP: 115/88  115/82  (!) 107/94  Pulse: 76 64  87  Resp: (!) 28 (!) 21  (!) 24  Temp:   98.5 F (36.9 C)   TempSrc:   Oral   SpO2: 96% 92%  94%  Weight:      Height:        Intake/Output Summary (Last 24 hours) at 12/09/2017 0919 Last data filed at 12/09/2017 0700 Gross per 24 hour  Intake 1227.55 ml  Output 65 ml  Net 1162.55 ml   Filed Weights   12/07/17 0152 12/07/17 1215 12/07/17 1545  Weight: 182 lb (82.6 kg) 182 lb 12.2 oz (82.9 kg) 172 lb 13.5 oz (78.4 kg)    Telemetry    Sinus rhythm with occasional PVCs- Personally Reviewed  ECG    Not performed today- Personally Reviewed  Physical Exam   GEN: No acute distress.   Neck: No JVD Cardiac: RRR, no murmurs, rubs, or gallops.  Respiratory: Clear to auscultation bilaterally. GI: Soft, nontender, non-distended  MS: No edema; No deformity. Neuro:  Nonfocal  Psych: Normal affect   Labs    Chemistry Recent Labs  Lab 12/07/17 0206  12/07/17 0646 12/08/17 0154 12/09/17 0307  NA 139   < > 140 139 134*  K 4.6   < > 3.6 3.3* 4.4  CL 104   < > 101 103 100  CO2 16*  --  20* 25 20*  GLUCOSE 156*   < > 92 77 84  BUN  53*   < > 54* 30* 40*  CREATININE 5.00*   < > 5.37* 3.75* 5.00*  CALCIUM 8.1*  --  8.3* 8.7* 8.7*  PROT 6.4*  --   --   --   --   ALBUMIN 2.3*  --   --   --   --   AST 67*  --   --   --   --   ALT 25  --   --   --   --   ALKPHOS 166*  --   --   --   --   BILITOT 1.2  --   --   --   --   GFRNONAA 11*  --  10* 15* 11*  GFRAA 12*  --  11* 18* 12*  ANIONGAP 19*  --  19* 11 14   < > = values in this interval not displayed.     Hematology Recent Labs  Lab 12/07/17 0646 12/08/17 0154 12/09/17 0307  WBC 6.6 4.9 7.2  RBC 3.26* 3.31* 3.44*  HGB 8.7* 8.8* 9.1*  HCT 29.0* 29.1* 31.6*  MCV 89.0 87.9 91.9  MCH 26.7 26.6 26.5  MCHC 30.0 30.2 28.8*  RDW 19.1* 19.3* 19.6*  PLT 124* 136* 133*    Cardiac Enzymes Recent Labs  Lab 12/07/17 0646 12/07/17 1412 12/07/17 2123  TROPONINI 0.65* 1.71* 1.91*      Recent Labs  Lab 12/07/17 0223  TROPIPOC 0.10*     BNPNo results for input(s): BNP, PROBNP in the last 168 hours.   DDimer No results for input(s): DDIMER in the last 168 hours.   Radiology    Dg Chest Port 1 View  Result Date: 12/08/2017 CLINICAL DATA:  Respiratory failure, endotracheal tube present. EXAM: PORTABLE CHEST 1 VIEW COMPARISON:  Radiograph December 07, 2017. FINDINGS: Stable cardiomegaly. Endotracheal and nasogastric tubes are unchanged in position. Right internal jugular dialysis catheter is unchanged. Stable mild right basilar subsegmental atelectasis is noted. Stable left basilar opacity is noted concerning for atelectasis with associated pleural effusion. No pneumothorax is noted. Bony thorax is unremarkable. IMPRESSION: Stable support apparatus. Stable bibasilar opacities, left greater than right, as described above. Electronically Signed   By: Marijo Conception, M.D.   On: 12/08/2017 08:51    Cardiac Studies   2D echocardiogram (12/07/2017)  Study Conclusions  - Left ventricle: The cavity size was normal. There was mild focal   basal hypertrophy of the septum. Systolic function was severely   reduced. The estimated ejection fraction was in the range of 15%   to 20%. Severe diffuse hypokinesis worse in the inferoseptal,   inferior and inferolateral myocardium. Features are consistent   with a pseudonormal left ventricular filling pattern, with   concomitant abnormal relaxation and increased filling pressure   (grade 2 diastolic dysfunction). Doppler parameters are   consistent with high ventricular filling pressure. - Aortic valve: Transvalvular velocity was within the normal range.   There was no stenosis. There was no regurgitation. Valve area   (VTI): 3.75 cm^2. Valve area (Vmax): 3.72 cm^2. Valve area   (Vmean): 3.59 cm^2. - Mitral valve: Transvalvular velocity was within the normal range.   There was no evidence for stenosis. There was mild regurgitation. -  Left atrium: The atrium was severely dilated. - Right ventricle: The cavity size was normal. Wall thickness was   normal. Systolic function was moderately reduced. - Right atrium: The atrium was severely dilated. - Tricuspid valve: There was moderate regurgitation. - Pulmonary arteries:  Systolic pressure was moderately increased.   PA peak pressure: 56 mm Hg (S). - Pericardium, extracardiac: There was a large left pleural   effusion.  Impressions:  - Compared with the echo 09/4560, systolic function is significantly   worse.   Patient Profile     67 y.o. male admitted after a witnessed VT arrest on 12/07/2017.  He was successfully extubated.  His EF is in the 15 to 20% range by 2D echo, worse than prior echoes.  He said no recurrent arrhythmias.  He is a hemodialysis patient is scheduled for.for dialysis today.  The plan was to perform left heart cath on Monday.  Assessment & Plan    1: Ventricular tachycardia- no further arrhythmias on telemetry.  Occasional isolated PVCs.  Electrolytes are stable including magnesium and potassium.  He is on amiodarone.  Currently not on a beta-blocker because of soft blood pressure although he would benefit from being on low-dose carvedilol.  2: Coronary artery disease- right and left heart cath performed by Dr. Haroldine Laws 02/16/2016 revealed a subtotally occluded codominant small RCA as well as moderate obtuse marginal branch disease.  His troponins increased to 1.9.  Plan for left heart cath on Monday.  3: Ischemic/nonischemic cardiomyopathy- EF now 15 to 20% by 2D echo.  He has a known nonischemic cardiomyopathy- he currently is not on a beta-blocker which can be started given stable blood pressures.  4: Chronic atrial fibrillation- rate controlled on Coumadin anticoagulation.  The INR was 3 today.  Coumadin has been held.  Plan is to transition IV heparin once the INR is less than 2.  Hopefully the INR will be appropriate for cath on Monday.  5:  Chronic renal insufficiency- on hemodialysis.       For questions or updates, please contact [place] Please consult [email or web address] for contact info under Cardiology/STEMI.      Signed, Quay Burow, MD  12/09/2017, 9:19 AM

## 2017-12-09 NOTE — H&P (Signed)
PULMONARY / CRITICAL CARE MEDICINE   Name: Patrick [name] MRN: [id number] DOB: [birth year]    ADMISSION DATE:  12/07/2017 CONSULTATION DATE: 12/07/17  Interval Hx: Extubated yesterday and has done very well. He denies any shortness of breath but complaining of being weak     REFERRING MD: Dr Betsey Holiday  CHIEF COMPLAINT: VT  HISTORY OF PRESENT ILLNESS:   [phone] with CHF (EF 30%), CAD, HTN, DM, OA, ESRD on HD (started HD on 10/09/17), Colon cancer s/p colostomy (2018), and Afib (on coumadin), presents to the ER following VT. He lives in a nursing home, where tonight he was found down on the floor beside his bed. Initially he was unresponsive but then woke up somewhat and c/o SOB. EMS was called and on moving him from the floor to the stretcher, patient developed VT. He was given Adenosine 6mg ,12mg ,12mg . Patient then became unresponsive and hypotensive and was defibrillated. He was then transferred to the ER where he was emergently intubated. At time of my exam patient is intubated but awake and calm. He does not remember any of the incident. He denies any CP or pain anywhere else. He has 1+ LLE edema but he says this is chronic. Discussed code status with him, and he nods that he is full code and would want defibrillation and cpr if needed.   PAST MEDICAL HISTORY :  He  has a past medical history of Arthritis, CAD (coronary artery disease), Chronic systolic (congestive) heart failure ([place]), Coronary artery disease involving native coronary artery of native heart with angina pectoris ([place]) (09/11/2016), Degenerative joint disease of knee, right, Dysrhythmia, Enlarged prostate, Hyperlipidemia, Hypertension, Pneumonia (05/2014), Prediabetes (10/03/2014), Scrotal edema (04/03/2015), and SMALL BOWEL OBSTRUCTION, HX OF (08/21/2007).  PAST SURGICAL HISTORY: He  has a past surgical history that includes Inguinal hernia repair (Left, 1990's); Cardiac catheterization (N/A, 02/16/2016); Colonoscopy (N/A, 04/01/2016); ir  generic historical (07/05/2016); XI abdominal perineal resection (N/A, 09/28/2016); IR Radiologist Eval & Mgmt (03/29/2017); IR Fluoro Guide CV Line Right (10/18/2017); and IR US Guide Vasc Access Right (10/18/2017).  Allergies  Allergen Reactions  . Nsaids     Kidney disease   No current facility-administered medications on file prior to encounter.    Current Outpatient Medications on File Prior to Encounter  Medication Sig  . acetaminophen (TYLENOL) 500 MG tablet Take 1,000 mg by mouth every 6 (six) hours as needed (for pain/fever/headaches.).   Marland Kitchen allopurinol (ZYLOPRIM) 300 MG tablet take 1 tablet by mouth once daily  . Amino Acids-Protein Hydrolys (FEEDING SUPPLEMENT, PRO-STAT SUGAR FREE 64,) LIQD Take 30 mLs by mouth 3 (three) times daily with meals.  Marland Kitchen atorvastatin (LIPITOR) 10 MG tablet Take 10 mg by mouth at bedtime.   . Baclofen 5 MG TABS Take 5 mg by mouth 3 (three) times daily as needed for muscle spasms.  Marland Kitchen buPROPion (WELLBUTRIN) 100 MG tablet take 1 tablet by mouth twice a day  . Melatonin 5 MG TABS Take 5 mg by mouth at bedtime.  . Multiple Vitamins-Minerals (MULTIVITAMIN WITH MINERALS) tablet Take 1 tablet by mouth daily. Men's 50+ Multivitamin  . nitroGLYCERIN (NITROSTAT) 0.4 MG SL tablet place 1 tablet under the tongue if needed every 5 minutes for chest pain for 3 doses IF NO RELIEF AFTER 1ST DOSE CALL 911.  Marland Kitchen oxyCODONE-acetaminophen (PERCOCET/ROXICET) 5-325 MG tablet Take 1 tablet by mouth every 6 (six) hours as needed for severe pain.  . pantoprazole (PROTONIX) 40 MG tablet Take 1 tablet (40 mg total) by mouth daily.  Marland Kitchen  traZODone (DESYREL) 50 MG tablet Take 1 tablet (50 mg total) by mouth at bedtime as needed for sleep.  Marland Kitchen warfarin (COUMADIN) 2 MG tablet 2mg  every evening. Check INR on 6/4. Adjust depending on INR. Goal INR: 2-3. (Patient taking differently: Take 2 mg by mouth daily at 6 PM. )  . Nutritional Supplements (FEEDING SUPPLEMENT, NEPRO CARB STEADY,) LIQD Take 237 mLs  by mouth 3 (three) times daily between meals. (Patient not taking: Reported on 12/07/2017)   FAMILY HISTORY:  His family history includes Arthritis in his sister; COPD in his sister; Cancer (age of onset: 58) in his father; Dementia in his father; Diabetes in his mother; Edema in his sister; Hypertension in his mother; Stroke in his mother.  SOCIAL HISTORY: He  reports that he has quit smoking. His smoking use included pipe and cigars. He quit after 5.00 years of use. He has never used smokeless tobacco. He reports that he does not drink alcohol or use drugs.  REVIEW OF SYSTEMS:  All 11 point system review were unremarkable other than what is mentioned in HPI      VITAL SIGNS: BP (!) 107/94 (BP Location: Left Arm)   Pulse 87   Temp 98.5 F (36.9 C) (Oral)   Resp (!) 24   Ht 5\' 7"  (1.702 m) Comment: measured at bedside by RT  Wt 78.4 kg (172 lb 13.5 oz)   SpO2 94%   BMI 27.07 kg/m   VENTILATOR SETTINGS:  PRVC, 100% FIO2, 5 PEEP, 18 RR, Vt 530  INTAKE / OUTPUT: I/O last 3 completed shifts: In: 1747.5 [P.O.:492; I.V.:1255.5] Out: 85 [Urine:35; Stool:50]  PHYSICAL EXAMINATION: General: WDWN Adult male,awake alert not in distress Neuro:  awake and calm, PERRL, Obeying commands, Moving all extremities  HEENT: OP clear, MM moist Cardiovascular: Irreg Irreg Lungs: CTA b/l Abdomen: Soft NTND, LLQ Ostomy  Musculoskeletal: 1+ LLE edema; no RLE edema  Skin: no rashes   LABS:  BMET Recent Labs  Lab 12/07/17 0646 12/08/17 0154 12/09/17 0307  NA 140 139 134*  K 3.6 3.3* 4.4  CL 101 103 100  CO2 20* 25 20*  BUN 54* 30* 40*  CREATININE 5.37* 3.75* 5.00*  GLUCOSE 92 77 84   Electrolytes Recent Labs  Lab 12/07/17 0206 12/07/17 0646 12/08/17 0154 12/09/17 0307 12/09/17 0509  CALCIUM 8.1* 8.3* 8.7* 8.7*  --   MG 1.8 1.9  --   --  2.1  PHOS  --  5.2*  --   --   --    CBC Recent Labs  Lab 12/07/17 0646 12/08/17 0154 12/09/17 0307  WBC 6.6 4.9 7.2  HGB 8.7* 8.8*  9.1*  HCT 29.0* 29.1* 31.6*  PLT 124* 136* 133*   Coag's Recent Labs  Lab 12/07/17 0206 12/08/17 0154 12/09/17 0307  INR 3.02 2.80 3.06   Sepsis Markers Recent Labs  Lab 12/07/17 0225 12/07/17 0407 12/07/17 0646 12/07/17 0948 12/08/17 0154 12/09/17 0307  LATICACIDVEN 8.06*  --  2.9* 1.4  --   --   PROCALCITON  --  0.67  --   --  5.18 4.75   ABG Recent Labs  Lab 12/07/17 0417 12/08/17 1004  PHART 7.380 7.423  PCO2ART 39.5 39.4  PO2ART 473.0* 170.0*    Liver Enzymes Recent Labs  Lab 12/07/17 0206  AST 67*  ALT 25  ALKPHOS 166*  BILITOT 1.2  ALBUMIN 2.3*   Cardiac Enzymes Recent Labs  Lab 12/07/17 0646 12/07/17 1412 12/07/17 2123  TROPONINI 0.65* 1.71* 1.91*  Glucose Recent Labs  Lab 12/08/17 1655 12/08/17 1952 12/08/17 2352 12/09/17 0352 12/09/17 0754 12/09/17 0822  GLUCAP 120* 107* 77 84 65* 97    Imaging No results found. CULTURES: Blood cultures (7/11): pending UA (7/11): ordered Sputum culture (7/11): pending  ANTIBIOTICS: None   SIGNIFICANT EVENTS: 7/11: presented to ER following unstable VT requiring defibrillation; intubated on ER arrival  LINES/TUBES: RIJ Tunneled HD Catheter (present on admission) 2-PIV's ETT 7/11>> OG tube 7/11>>  DISCUSSION: 67yoM with CHF (EF 30%), CAD, HTN, DM, OA, ESRD on HD (started HD on 10/09/17), Colon cancer s/p colostomy (2018), and Afib (on coumadin), presents to the ER following unstable VT requiring defibrillation and intubation.   ASSESSMENT / PLAN:  PULMONARY 1. Acute Hypoxic Respiratory Failure now off mechanical ventilation: - extubated 7/12   CARDIOVASCULAR 1. Ventricular Tachycardia; Hx CAD, CHF (EF 30%), NON ischemic cardiomyopathyHTN, Afib:  - unclear what caused his VTach ? Related to his low EF - cath on Monday - keep off warfarin - heparin drip when INR<2 - continue Amiodarone   RENAL 1. ESRD on HD: -HD done on 7/11. He is on MWF schedule usually  - we appreciate  nephro follow up   GASTROINTESTINAL No active issues; NPO; GI prophylaxis Cardiac renal diet    HEMATOLOGIC 1. Anemia: - no signs of bleeding ? Related to ESRD   INFECTIOUS 1. Lactic acidosis:  - most likely related to arrest   ENDOCRINE 1. DM: - diabetic diet   SSI  NEUROLOGIC No active issues    FAMILY  - Updates: No family present - disposition: patient can go to step down under the hospitalist   - Inter-disciplinary family meet or Palliative Care meeting due by: 12/13/17    Silver Huguenin , MD  Pulmonary and [place] Pager: [phone]  12/09/2017, 9:46 AM

## 2017-12-10 DIAGNOSIS — I429 Cardiomyopathy, unspecified: Secondary | ICD-10-CM

## 2017-12-10 DIAGNOSIS — Z992 Dependence on renal dialysis: Secondary | ICD-10-CM

## 2017-12-10 DIAGNOSIS — N186 End stage renal disease: Secondary | ICD-10-CM

## 2017-12-10 DIAGNOSIS — I25119 Atherosclerotic heart disease of native coronary artery with unspecified angina pectoris: Secondary | ICD-10-CM

## 2017-12-10 LAB — CBC WITH DIFFERENTIAL/PLATELET
ABS IMMATURE GRANULOCYTES: 0 10*3/uL (ref 0.0–0.1)
BASOS ABS: 0 10*3/uL (ref 0.0–0.1)
BASOS PCT: 0 %
EOS ABS: 0.1 10*3/uL (ref 0.0–0.7)
Eosinophils Relative: 1 %
HCT: 30.8 % — ABNORMAL LOW (ref 39.0–52.0)
Hemoglobin: 9.2 g/dL — ABNORMAL LOW (ref 13.0–17.0)
IMMATURE GRANULOCYTES: 0 %
Lymphocytes Relative: 10 %
Lymphs Abs: 0.7 10*3/uL (ref 0.7–4.0)
MCH: 26.1 pg (ref 26.0–34.0)
MCHC: 29.9 g/dL — ABNORMAL LOW (ref 30.0–36.0)
MCV: 87.5 fL (ref 78.0–100.0)
Monocytes Absolute: 1 10*3/uL (ref 0.1–1.0)
Monocytes Relative: 14 %
NEUTROS ABS: 5.1 10*3/uL (ref 1.7–7.7)
NEUTROS PCT: 75 %
PLATELETS: 126 10*3/uL — AB (ref 150–400)
RBC: 3.52 MIL/uL — ABNORMAL LOW (ref 4.22–5.81)
RDW: 19.3 % — ABNORMAL HIGH (ref 11.5–15.5)
WBC: 6.9 10*3/uL (ref 4.0–10.5)

## 2017-12-10 LAB — GLUCOSE, CAPILLARY
GLUCOSE-CAPILLARY: 91 mg/dL (ref 70–99)
GLUCOSE-CAPILLARY: 94 mg/dL (ref 70–99)
Glucose-Capillary: 68 mg/dL — ABNORMAL LOW (ref 70–99)
Glucose-Capillary: 87 mg/dL (ref 70–99)
Glucose-Capillary: 64 mg/dL — ABNORMAL LOW (ref 70–99)
Glucose-Capillary: 131 mg/dL — ABNORMAL HIGH (ref 70–99)

## 2017-12-10 LAB — BASIC METABOLIC PANEL
ANION GAP: 11 (ref 5–15)
BUN: 22 mg/dL (ref 8–23)
CO2: 24 mmol/L (ref 22–32)
CREATININE: 3.77 mg/dL — AB (ref 0.61–1.24)
Calcium: 8.6 mg/dL — ABNORMAL LOW (ref 8.9–10.3)
Chloride: 99 mmol/L (ref 98–111)
GFR calc Af Amer: 17 mL/min — ABNORMAL LOW (ref >60)
GFR calc non Af Amer: 15 mL/min — ABNORMAL LOW (ref >60)
Glucose, Bld: 82 mg/dL (ref 70–99)
POTASSIUM: 3.6 mmol/L (ref 3.5–5.1)
SODIUM: 134 mmol/L — AB (ref 135–145)

## 2017-12-10 LAB — PROTIME-INR
INR: 2.38
INR: 1.93
PROTHROMBIN TIME: 25.8 s — AB (ref 11.4–15.2)
Prothrombin Time: 21.9 s — ABNORMAL HIGH (ref 11.4–15.2)

## 2017-12-10 MED ORDER — METOPROLOL TARTRATE 12.5 MG HALF TABLET
12.5000 mg | ORAL_TABLET | Freq: Two times a day (BID) | ORAL | Status: DC
  Administered 2017-12-10 – 2017-12-13 (×5): 12.5 mg via ORAL
  Filled 2017-12-10 (×7): qty 1

## 2017-12-10 MED ORDER — HEPARIN (PORCINE) IN NACL 100-0.45 UNIT/ML-% IJ SOLN
1850.0000 [IU]/h | INTRAMUSCULAR | Status: DC
  Administered 2017-12-10: 1150 [IU]/h via INTRAVENOUS
  Administered 2017-12-12: 1850 [IU]/h via INTRAVENOUS
  Administered 2017-12-12: 1750 [IU]/h via INTRAVENOUS
  Administered 2017-12-13: 1850 [IU]/h via INTRAVENOUS
  Filled 2017-12-10 (×5): qty 250

## 2017-12-10 NOTE — Progress Notes (Addendum)
[facility] for IV heparin once INR < 2 Indication: atrial fibrillation  Allergies  Allergen Reactions  . Nsaids     Kidney disease    Patient Measurements: Height: 5\' 7"  (170.2 cm)(measured at bedside by RT) Weight: 176 lb 2.4 oz (79.9 kg) IBW/kg (Calculated) : 66.1 Heparin Dosing Weight: 82.6 kg  Vital Signs: Temp: 98.9 F (37.2 C) (07/14 0749) Temp Source: Oral (07/14 0749) BP: 104/67 (07/14 0948) Pulse Rate: 73 (07/14 0948)  Labs: Recent Labs    12/07/17 1412 12/07/17 2123  12/08/17 0154 12/09/17 0307 12/10/17 0620  HGB  --   --    < > 8.8* 9.1* 9.2*  HCT  --   --   --  29.1* 31.6* 30.8*  PLT  --   --   --  136* 133* 126*  LABPROT  --   --   --  29.3* 31.4* 25.8*  INR  --   --   --  2.80 3.06 2.38  CREATININE  --   --   --  3.75* 5.00* 3.77*  TROPONINI 1.71* 1.91*  --   --   --   --    < > = values in this interval not displayed.    Estimated Creatinine Clearance: 18.7 mL/min (A) (by C-G formula based on SCr of 3.77 mg/dL (H)).   Medical History: Past Medical History:  Diagnosis Date  . Arthritis    "right hand; right knee" (06/19/2014)  . CAD (coronary artery disease)    2v CAD with subtotal occlusion of small co-dominant RCA and borderline lesion in moderate-sized OM-1  . Chronic systolic (congestive) heart failure ([place])    Preserved EF 2016, has been between 25% and 45% since then  . Coronary artery disease involving native coronary artery of native heart with angina pectoris ([place]) 09/11/2016  . Degenerative joint disease of knee, right   . Dysrhythmia   . Enlarged prostate   . Hyperlipidemia   . Hypertension   . Pneumonia 05/2014  . Prediabetes 10/03/2014  . Scrotal edema 04/03/2015  . SMALL BOWEL OBSTRUCTION, HX OF 08/21/2007   Annotation: with narrowing in the ileocecal region Qualifier: Diagnosis of  By: Hassell Done FNP, Tori Milks      Medications:  Scheduled:  . amiodarone  400 mg Oral BID  . aspirin  81 mg Oral  Pre-Cath  . Chlorhexidine Gluconate Cloth  6 each Topical Q0600  . Chlorhexidine Gluconate Cloth  6 each Topical Q0600  . darbepoetin (ARANESP) injection - DIALYSIS  60 mcg Intravenous Q Thu-HD  . famotidine  20 mg Per Tube Daily  . insulin aspart  2-6 Units Subcutaneous Q4H  . metoprolol tartrate  12.5 mg Oral BID  . mupirocin ointment  1 application Nasal BID  . sodium chloride flush  3 mL Intravenous Q12H  . traZODone  50 mg Oral QHS    Assessment: 67 yo male admitted with VT.  On chronic Coumadin for afib with INR 3.02 on admission.  Last PTA Coumadin dose recorded as 12/06/17.    INR this morning decreased from 3.06 to 2.38, warfarin continues to be held. Hgb stable at 9.2, plt 126. No s/sx of bleeding. No overt bleeding or complications noted.  Goal of Therapy:  Heparin level 0.3-0.7 units/ml Monitor platelets by anticoagulation protocol: Yes   Plan:  1. Start IV heparin per pharmacy once INR falls < 2. 2. Repeat INR at 1800. 3. Follow for anticoagulation plans after cath planned for 7/15@0730 .  Doylene Canard, PharmD Clinical Pharmacist  Pager: [phone] Phone: [phone]  12/10/2017 10:54 AM   ADDENDUM INR came back at 1.93 tonight. Will start heparin infusion at 1150 units/hr without bolus and obtain heparin level 8 hr. No s/sx of bleeding.   Doylene Canard, PharmD Clinical Pharmacist

## 2017-12-10 NOTE — Progress Notes (Signed)
Progress Note  Patient Name: Patrick [name] Date of Encounter: 12/10/2017  Primary Cardiologist: Dr. Minus Breeding  Subjective   No chest pain or breathlessness this morning.  States that he slept reasonably well last night.  No palpitations.  Inpatient Medications    Scheduled Meds: . amiodarone  400 mg Oral BID  . aspirin  81 mg Oral Pre-Cath  . Chlorhexidine Gluconate Cloth  6 each Topical Q0600  . Chlorhexidine Gluconate Cloth  6 each Topical Q0600  . darbepoetin (ARANESP) injection - DIALYSIS  60 mcg Intravenous Q Thu-HD  . famotidine  20 mg Per Tube Daily  . insulin aspart  2-6 Units Subcutaneous Q4H  . mupirocin ointment  1 application Nasal BID  . sodium chloride flush  3 mL Intravenous Q12H  . traZODone  50 mg Oral QHS   Continuous Infusions: . sodium chloride    . [START ON 12/11/2017] sodium chloride    . dextrose 30 mL/hr at 12/10/17 0331   PRN Meds: sodium chloride, acetaminophen, docusate, sodium chloride flush   Vital Signs    Vitals:   12/09/17 2009 12/10/17 0024 12/10/17 0507 12/10/17 0749  BP: (!) 166/81 124/79 101/63 113/68  Pulse: 73 88 74 79  Resp: 18 18 18    Temp: 97.7 F (36.5 C) 98 F (36.7 C) 98 F (36.7 C) 98.9 F (37.2 C)  TempSrc: Oral Oral Oral Oral  SpO2:      Weight:   176 lb 2.4 oz (79.9 kg)   Height:        Intake/Output Summary (Last 24 hours) at 12/10/2017 0927 Last data filed at 12/10/2017 0331 Gross per 24 hour  Intake 852.1 ml  Output 2155 ml  Net -1302.9 ml   Filed Weights   12/09/17 1220 12/09/17 1629 12/10/17 0507  Weight: 178 lb 2.1 oz (80.8 kg) 173 lb 1 oz (78.5 kg) 176 lb 2.4 oz (79.9 kg)    Telemetry    Intermittent sinus beats, rhythm difficult to discern.  Personally reviewed.  Physical Exam   GEN:  Chronically ill-appearing male.  No acute distress.   Neck: No JVD. Cardiac:  Irregular, soft systolic murmur, no gallop.  Respiratory: Nonlabored.  Few scattered rhonchi. GI: Soft, nontender, bowel sounds  present. MS: No edema; No deformity. Neuro:  Nonfocal. Psych: Alert and oriented x 3. Normal affect.  Labs    Chemistry Recent Labs  Lab 12/07/17 0206  12/08/17 0154 12/09/17 0307 12/10/17 0620  NA 139   < > 139 134* 134*  K 4.6   < > 3.3* 4.4 3.6  CL 104   < > 103 100 99  CO2 16*   < > 25 20* 24  GLUCOSE 156*   < > 77 84 82  BUN 53*   < > 30* 40* 22  CREATININE 5.00*   < > 3.75* 5.00* 3.77*  CALCIUM 8.1*   < > 8.7* 8.7* 8.6*  PROT 6.4*  --   --   --   --   ALBUMIN 2.3*  --   --   --   --   AST 67*  --   --   --   --   ALT 25  --   --   --   --   ALKPHOS 166*  --   --   --   --   BILITOT 1.2  --   --   --   --   GFRNONAA 11*   < > 15*  11* 15*  GFRAA 12*   < > 18* 12* 17*  ANIONGAP 19*   < > 11 14 11    < > = values in this interval not displayed.     Hematology Recent Labs  Lab 12/08/17 0154 12/09/17 0307 12/10/17 0620  WBC 4.9 7.2 6.9  RBC 3.31* 3.44* 3.52*  HGB 8.8* 9.1* 9.2*  HCT 29.1* 31.6* 30.8*  MCV 87.9 91.9 87.5  MCH 26.6 26.5 26.1  MCHC 30.2 28.8* 29.9*  RDW 19.3* 19.6* 19.3*  PLT 136* 133* 126*    Cardiac Enzymes Recent Labs  Lab 12/07/17 0646 12/07/17 1412 12/07/17 2123  TROPONINI 0.65* 1.71* 1.91*    Recent Labs  Lab 12/07/17 0223  TROPIPOC 0.10*     Radiology    No results found.  Cardiac Studies   Echocardiogram 12/07/2017: Study Conclusions  - Left ventricle: The cavity size was normal. There was mild focal   basal hypertrophy of the septum. Systolic function was severely   reduced. The estimated ejection fraction was in the range of 15%   to 20%. Severe diffuse hypokinesis worse in the inferoseptal,   inferior and inferolateral myocardium. Features are consistent   with a pseudonormal left ventricular filling pattern, with   concomitant abnormal relaxation and increased filling pressure   (grade 2 diastolic dysfunction). Doppler parameters are   consistent with high ventricular filling pressure. - Aortic valve:  Transvalvular velocity was within the normal range.   There was no stenosis. There was no regurgitation. Valve area   (VTI): 3.75 cm^2. Valve area (Vmax): 3.72 cm^2. Valve area   (Vmean): 3.59 cm^2. - Mitral valve: Transvalvular velocity was within the normal range.   There was no evidence for stenosis. There was mild regurgitation. - Left atrium: The atrium was severely dilated. - Right ventricle: The cavity size was normal. Wall thickness was   normal. Systolic function was moderately reduced. - Right atrium: The atrium was severely dilated. - Tricuspid valve: There was moderate regurgitation. - Pulmonary arteries: Systolic pressure was moderately increased.   PA peak pressure: 56 mm Hg (S). - Pericardium, extracardiac: There was a large left pleural   effusion.  Impressions:  - Compared with the echo 07/7515, systolic function is significantly   worse.  Patient Profile     67 y.o. male with a history of CAD including subtotal occlusion of a small codominant RCA with 70% OM stenosis that has been managed medically, hypertension, hyperlipidemia, ESRD on hemodialysis, chronic combined heart failure, persistent atrial fibrillation on Coumadin, and colon cancer status post resection and colostomy in 2017.  He is status post presentation with witnessed VT arrest, successfully resuscitated.  He has newly documented worsening in LVEF to the range of 15 to 20%.  Assessment & Plan    1.  Status post witnessed VT arrest, no significant recurrences on telemetry.  He continues on oral amiodarone load.  2.  CAD with history of subtotally occluded codominant RCA as well as 70% OM stenosis as of 2017.  Peak troponin I during this hospitalization up to 1.91.  Reports no active chest pain.  3.  ESRD on hemodialysis.  4.  Secondary cardiomyopathy with history of nonischemic cardiomyopathy and recently documented worsening of LVEF to the range of 15 to 20%.  5.  Persistent atrial fibrillation,  previously on Coumadin, held in anticipation of further invasive testing.  To initiate IV heparin once INR less than 2.0.  Patient is symptomatically stable at this time.  Coumadin was held  in anticipation of possible cardiac catheterization for tomorrow, already scheduled.  INR is down to 2.38.  Additional medical therapy includes amiodarone load.  Plan to add low-dose beta-blocker, blood pressure has been stable.  Follow-up ECG.  Signed, Rozann Lesches, MD  12/10/2017, 9:27 AM

## 2017-12-10 NOTE — Progress Notes (Signed)
[facility]  PROGRESS NOTE  Patrick [name] QPR:[id number] DOB: [birth year] DOA: 12/07/2017 PCP: Patient, [name]   Brief HPI:   67 year old male with a history of CHF, CAD, hypertension, diabetes mellitus, ESRD on hemodialysis, atrial fibrillation, colon cancer status post colostomy was brought to the ED after patient had a witnessed VT arrest status post successful resuscitation.  He was intubated and was sent to the [facility] service.  He was extubated on 12/08/2017 transferred to try to hospitalist service on 12/10/2017.  Cardiology and nephrology are following.    Subjective   This morning patient denies any chest pain or shortness of breath.     Assessment/Plan:     1. Status post VT arrest-successful resuscitation, patient is now extubated.  Continue amiodarone 2. Atrial fibrillation-heart rate is controlled, warfarin is on hold, will start heparin when INR less than 2.  INR still greater than 2 3. ESRD-on hemodialysis, nephrology following 4. Diabetes mellitus-blood glucose controlled, continue sliding scale insulin with NovoLog. 5. History of colon cancer-status post colostomy 6. 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  CAD 7. CAD-history of subtotally occluded codominant RCA,  did have elevated troponin.  No active chest pain.  Cardiology following.    DVT prophylaxis: INR greater than 2  Code Status: Full code  Family Communication: No family at bedside  Disposition Plan: likely home when medically ready for discharge   Consultants:  PCCM  Cardiology  Nephrology  Procedures:  None   Antibiotics:   Anti-infectives (From admission, onward)   None       Objective   Vitals:   12/10/17 0507 12/10/17 0749 12/10/17 0948 12/10/17 1141  BP: 101/63 113/68 104/67 107/71  Pulse: 74 79 73 62  Resp: 18     Temp: 98 F (36.7 C) 98.9 F (37.2 C)  97.7 F (36.5 C)  TempSrc: Oral Oral  Oral  SpO2:      Weight: 79.9  kg (176 lb 2.4 oz)     Height:        Intake/Output Summary (Last 24 hours) at 12/10/2017 1607 Last data filed at 12/10/2017 0900 Gross per 24 hour  Intake 982.1 ml  Output 2100 ml  Net -1117.9 ml   Filed Weights   12/09/17 1220 12/09/17 1629 12/10/17 0507  Weight: 80.8 kg (178 lb 2.1 oz) 78.5 kg (173 lb 1 oz) 79.9 kg (176 lb 2.4 oz)     Physical Examination:    General: Appears in no acute distress   Cardiovascular: S1-S2 regular   Respiratory: Clear bilaterally  Abdomen: Soft, nontender, no organomegaly, colostomy bag in place  Extremities: No edema in the lower extremities  Neurologic:   alert, oriented x3     Data Reviewed: I have personally reviewed following labs and imaging studies  CBG: Recent Labs  Lab 12/09/17 2008 12/10/17 0022 12/10/17 0503 12/10/17 0746 12/10/17 1140  GLUCAP 96 94 91 68* 87    CBC: Recent Labs  Lab 12/07/17 0206 12/07/17 0224 12/07/17 0646 12/08/17 0154 12/09/17 0307 12/10/17 0620  WBC 6.1  --  6.6 4.9 7.2 6.9  NEUTROABS 4.7  --   --  3.4 5.1 5.1  HGB 9.4* 10.9* 8.7* 8.8* 9.1* 9.2*  HCT 32.1* 32.0* 29.0* 29.1* 31.6* 30.8*  MCV 89.9  --  89.0 87.9 91.9 87.5  PLT 157  --  124* 136* 133* 126*    Basic Metabolic Panel: Recent Labs  Lab 12/07/17 0206 12/07/17 0224 12/07/17 0646 12/08/17 0154  12/09/17 0307 12/09/17 0509 12/10/17 0620  NA 139 140 140 139 134*  --  134*  K 4.6 4.4 3.6 3.3* 4.4  --  3.6  CL 104 104 101 103 100  --  99  CO2 16*  --  20* 25 20*  --  24  GLUCOSE 156* 149* 92 77 84  --  82  BUN 53* 62* 54* 30* 40*  --  22  CREATININE 5.00* 4.80* 5.37* 3.75* 5.00*  --  3.77*  CALCIUM 8.1*  --  8.3* 8.7* 8.7*  --  8.6*  MG 1.8  --  1.9  --   --  2.1  --   PHOS  --   --  5.2*  --   --   --   --     Recent Results (from the past 240 hour(s))  Culture, blood (routine x 2)     Status: None (Preliminary result)   Collection Time: 12/07/17  4:00 AM  Result Value Ref Range Status   Specimen Description  BLOOD RIGHT HAND  Final   Special Requests   Final    BOTTLES DRAWN AEROBIC AND ANAEROBIC Blood Culture adequate volume   Culture   Final    NO GROWTH 2 DAYS Performed at [facility], [address], [place], [place] [address]    Report Status PENDING  Incomplete  Culture, blood (routine x 2)     Status: None (Preliminary result)   Collection Time: 12/07/17  4:18 AM  Result Value Ref Range Status   Specimen Description BLOOD LEFT HAND  Final   Special Requests   Final    BOTTLES DRAWN AEROBIC AND ANAEROBIC Blood Culture adequate volume   Culture   Final    NO GROWTH 2 DAYS Performed at [facility], [place] [address], [place], [place] [address]    Report Status PENDING  Incomplete  MRSA PCR Screening     Status: Abnormal   Collection Time: 12/07/17  6:04 AM  Result Value Ref Range Status   MRSA by PCR POSITIVE (A) NEGATIVE Final    Comment:        The GeneXpert MRSA Assay (FDA approved for NASAL specimens only), is one component of a comprehensive MRSA colonization surveillance program. It is not intended to diagnose MRSA infection nor to guide or monitor treatment for MRSA infections. CRITICAL RESULT CALLED TO, READ BACK BY AND VERIFIED WITH: RN C RICE 12/07/17 AT 815 BY CM Performed at [facility], [place] [address] [place], [place] [address]      Liver Function Tests: Recent Labs  Lab 12/07/17 0206  AST 67*  ALT 25  ALKPHOS 166*  BILITOT 1.2  PROT 6.4*  ALBUMIN 2.3*   No results for input(s): LIPASE, AMYLASE in the last 168 hours. No results for input(s): AMMONIA in the last 168 hours.  Cardiac Enzymes: Recent Labs  Lab 12/07/17 0646 12/07/17 1412 12/07/17 2123  TROPONINI 0.65* 1.71* 1.91*   BNP (last 3 results) Recent Labs    05/23/17 1332  BNP 487.5*    ProBNP (last 3 results) No results for input(s): PROBNP in the last 8760 hours.    Studies: No results found.  Scheduled Meds: . amiodarone  400 mg Oral BID  . aspirin   81 mg Oral Pre-Cath  . Chlorhexidine Gluconate Cloth  6 each Topical Q0600  . Chlorhexidine Gluconate Cloth  6 each Topical Q0600  . darbepoetin (ARANESP) injection - DIALYSIS  60  mcg Intravenous Q Thu-HD  . famotidine  20 mg Per Tube Daily  . insulin aspart  2-6 Units Subcutaneous Q4H  . metoprolol tartrate  12.5 mg Oral BID  . mupirocin ointment  1 application Nasal BID  . sodium chloride flush  3 mL Intravenous Q12H  . traZODone  50 mg Oral QHS      Time spent: 25 min  Oswald Hillock   [facility] Pager [phone]. If 7PM-7AM, please contact night-coverage at [email or web address], Office  [phone]  password [email or web address]  12/10/2017, 4:07 PM  LOS: 3 days

## 2017-12-10 NOTE — Progress Notes (Signed)
[facility] Progress Note  Subjective: extubated yesterday. Seen by cardiology, hx CAD , planning heart cath on Monday.  Pt w/o complaints today.   Vitals:   12/10/17 0507 12/10/17 0749 12/10/17 0948 12/10/17 1141  BP: 101/63 113/68 104/67 107/71  Pulse: 74 79 73 62  Resp: 18     Temp: 98 F (36.7 C) 98.9 F (37.2 C)  97.7 F (36.5 C)  TempSrc: Oral Oral  Oral  SpO2:      Weight: 79.9 kg (176 lb 2.4 oz)     Height:        Inpatient medications: . amiodarone  400 mg Oral BID  . aspirin  81 mg Oral Pre-Cath  . Chlorhexidine Gluconate Cloth  6 each Topical Q0600  . Chlorhexidine Gluconate Cloth  6 each Topical Q0600  . darbepoetin (ARANESP) injection - DIALYSIS  60 mcg Intravenous Q Thu-HD  . famotidine  20 mg Per Tube Daily  . insulin aspart  2-6 Units Subcutaneous Q4H  . metoprolol tartrate  12.5 mg Oral BID  . mupirocin ointment  1 application Nasal BID  . sodium chloride flush  3 mL Intravenous Q12H  . traZODone  50 mg Oral QHS   . sodium chloride    . [START ON 12/11/2017] sodium chloride    . dextrose 30 mL/hr at 12/10/17 0331   sodium chloride, acetaminophen, docusate, sodium chloride flush    Home meds:  - coumadin 2mg  hs/ lipitor 10 mg hs  - wellbutrin 100mg  qd/ baclofen 5 mg tid prn/ percocet prn/ desyrel 50 hs  - protonix 40 qd/ sl ntg prn/ mvi/ allopurinol 300 qd   Exam: Gen frail older adult male, no distress No jvd or bruits Chest clear ant/ lat, no wheezing RRR loud 3/6 holo syst M, no RG Abd soft ntnd no mass or ascites +bs, LLQ colostomy Ext on edema LE's Neuro is alert, Ox 3 , nf, moves all ext   CXR 7/12 > crowding lung vessels, no gross edema   Dialysis:  [facility] (recent start  4h   400/800  79kg   3K/ 2Ca bath  Hep 2700  R IJ TDC - mircera 75 every 2 wks last 6/27   Impression: 1  VTach/ witnessed arrest  - on amiodarone here. For heart cath Monday.  2  Afib - on coumadin at home. In NSR here.  3  HFrEF - hx EF  30% in past > now EF low at 15- 20%.  Per cards 4  ESRD - on HD TTS. Next HD 7/16.   5  Volume - at dry wt 6  Hx colon Ca - sp colostomy 7  Depression 8  Gout 9  Anemia ckd - Hb 9's, got darbe 60 ug here on Thursday 7/11  Plan - as above   Kelly Splinter MD [employer] pgr [phone]   12/10/2017, 12:36 PM     Recent Labs  Lab 12/07/17 0206  12/07/17 0646  12/09/17 0307 12/10/17 0620  NA 139   < > 140   < > 134* 134*  K 4.6   < > 3.6   < > 4.4 3.6  CL 104   < > 101   < > 100 99  CO2 16*  --  20*   < > 20* 24  GLUCOSE 156*   < > 92   < > 84 82  BUN 53*   < > 54*   < > 40* 22  CREATININE 5.00*   < > 5.37*   < > 5.00* 3.77*  CALCIUM 8.1*  --  8.3*   < > 8.7* 8.6*  PHOS  --   --  5.2*  --   --   --   ALBUMIN 2.3*  --   --   --   --   --   INR 3.02  --   --    < > 3.06 2.38   < > = values in this interval not displayed.   Recent Labs  Lab 12/07/17 0206  AST 67*  ALT 25  ALKPHOS 166*  BILITOT 1.2  PROT 6.4*   Recent Labs  Lab 12/09/17 0307 12/10/17 0620  WBC 7.2 6.9  NEUTROABS 5.1 5.1  HGB 9.1* 9.2*  HCT 31.6* 30.8*  MCV 91.9 87.5  PLT 133* 126*   Iron/TIBC/Ferritin/ %Sat    Component Value Date/Time   IRON 45 10/11/2017 0920   IRON 29 (L) 10/19/2016 1039   TIBC 148 (L) 10/11/2017 0920   TIBC 191 (L) 10/19/2016 1039   FERRITIN 686 (H) 10/11/2017 0920   FERRITIN 125 12/22/2016 1446   IRONPCTSAT 30 10/11/2017 0920   IRONPCTSAT 15 (L) 10/19/2016 1039

## 2017-12-11 LAB — BASIC METABOLIC PANEL
ANION GAP: 14 (ref 5–15)
BUN: 32 mg/dL — ABNORMAL HIGH (ref 8–23)
CALCIUM: 8.7 mg/dL — AB (ref 8.9–10.3)
CO2: 23 mmol/L (ref 22–32)
CREATININE: 4.91 mg/dL — AB (ref 0.61–1.24)
Chloride: 97 mmol/L — ABNORMAL LOW (ref 98–111)
GFR, EST AFRICAN AMERICAN: 13 mL/min — AB (ref >60)
GFR, EST NON AFRICAN AMERICAN: 11 mL/min — AB (ref >60)
Glucose, Bld: 89 mg/dL (ref 70–99)
Potassium: 3.9 mmol/L (ref 3.5–5.1)
Sodium: 134 mmol/L — ABNORMAL LOW (ref 135–145)

## 2017-12-11 LAB — PROTIME-INR
INR: 1.82
INR: 1.89
PROTHROMBIN TIME: 20.9 s — AB (ref 11.4–15.2)
PROTHROMBIN TIME: 21.5 s — AB (ref 11.4–15.2)

## 2017-12-11 LAB — HEPARIN LEVEL (UNFRACTIONATED)
Heparin Unfractionated: <0.1 IU/mL — ABNORMAL LOW (ref 0.30–0.70)
Heparin Unfractionated: <0.1 IU/mL — ABNORMAL LOW (ref 0.30–0.70)
Heparin Unfractionated: 0.29 IU/mL — ABNORMAL LOW (ref 0.30–0.70)

## 2017-12-11 LAB — CBC
HCT: 31.9 % — ABNORMAL LOW (ref 39.0–52.0)
Hemoglobin: 9.6 g/dL — ABNORMAL LOW (ref 13.0–17.0)
MCH: 26.2 pg (ref 26.0–34.0)
MCHC: 30.1 g/dL (ref 30.0–36.0)
MCV: 86.9 fL (ref 78.0–100.0)
PLATELETS: 172 10*3/uL (ref 150–400)
RBC: 3.67 MIL/uL — ABNORMAL LOW (ref 4.22–5.81)
RDW: 19.1 % — ABNORMAL HIGH (ref 11.5–15.5)
WBC: 7.1 10*3/uL (ref 4.0–10.5)

## 2017-12-11 LAB — GLUCOSE, CAPILLARY
GLUCOSE-CAPILLARY: 131 mg/dL — AB (ref 70–99)
GLUCOSE-CAPILLARY: 50 mg/dL — AB (ref 70–99)
GLUCOSE-CAPILLARY: 71 mg/dL (ref 70–99)
GLUCOSE-CAPILLARY: 87 mg/dL (ref 70–99)
Glucose-Capillary: 111 mg/dL — ABNORMAL HIGH (ref 70–99)
Glucose-Capillary: 132 mg/dL — ABNORMAL HIGH (ref 70–99)
Glucose-Capillary: 71 mg/dL (ref 70–99)
Glucose-Capillary: 94 mg/dL (ref 70–99)

## 2017-12-11 MED ORDER — FAMOTIDINE 20 MG PO TABS
20.0000 mg | ORAL_TABLET | Freq: Every day | ORAL | Status: DC
  Administered 2017-12-11 – 2017-12-17 (×7): 20 mg via ORAL
  Filled 2017-12-11 (×7): qty 1

## 2017-12-11 MED ORDER — DEXTROSE 50 % IV SOLN
INTRAVENOUS | Status: AC
  Administered 2017-12-11: 50 mL
  Filled 2017-12-11: qty 50

## 2017-12-11 MED ORDER — CHLORHEXIDINE GLUCONATE CLOTH 2 % EX PADS
6.0000 | MEDICATED_PAD | Freq: Every day | CUTANEOUS | Status: DC
  Administered 2017-12-12 – 2017-12-13 (×2): 6 via TOPICAL

## 2017-12-11 NOTE — Progress Notes (Addendum)
Inpatient Diabetes Program Recommendations  AACE/ADA: New Consensus Statement on Inpatient Glycemic Control (2015)  Target Ranges:  Prepandial:   less than 140 mg/dL      Peak postprandial:   less than 180 mg/dL (1-2 hours)      Critically ill patients:  140 - 180 mg/dL   Results for Patrick [name], Patrick [name] (MRN [id number]) as of 12/11/2017 10:01  Ref. Range 12/10/2017 05:03 12/10/2017 07:46 12/10/2017 11:40 12/10/2017 17:52 12/10/2017 20:24 12/11/2017 00:04 12/11/2017 00:43 12/11/2017 07:58  Glucose-Capillary Latest Ref Range: 70 - 99 mg/dL 91 68 (L) 87 64 (L) 131 (H) 50 (L) 131 (H) 71    Review of Glycemic Control  Diabetes history: DM Current orders for Inpatient glycemic control: Novolog 2-6 units Q4 hours  Inpatient Diabetes Program Recommendations:    Patient currently on Moderate ICU correction scale.   Patient also having hypoglycemia with each coverage. Please d/c and change to the regular glycemic control order set sensitive correction, Novolog 0-9 units Q4 hours.  Thanks,  Tama Headings RN, MSN, BC-ADM, [facility] Inpatient Diabetes Coordinator Team Pager [phone] (8a-5p)

## 2017-12-11 NOTE — Progress Notes (Signed)
[name] Progress Note   Subjective:   Sitting in bedside chair, just finished breakfast.  Reports orthopnea overnight, denies SOB today.   Objective Vitals:   12/10/17 2026 12/11/17 0007 12/11/17 0500 12/11/17 0819  BP: 126/84 120/80 108/73   Pulse:  71 71 72  Resp:  18 18 19   Temp:  (!) 97.5 F (36.4 C) 97.6 F (36.4 C) 97.9 F (36.6 C)  TempSrc:  Oral Oral Oral  SpO2: 100%   97%  Weight:   80.2 kg (176 lb 11.2 oz)   Height:       Physical Exam General:NAD, pale, frail, chronically ill appearing male Heart:RRR, +0/3 systolic murmur.  Lungs:mostly CTAB, no crackles Abdomen:soft, NTND, LLQ colostomy  Extremities:1+ LE edema Dialysis Access: R IJ [facility]   Filed Weights   12/09/17 1629 12/10/17 0507 12/11/17 0500  Weight: 78.5 kg (173 lb 1 oz) 79.9 kg (176 lb 2.4 oz) 80.2 kg (176 lb 11.2 oz)    Intake/Output Summary (Last 24 hours) at 12/11/2017 1150 Last data filed at 12/11/2017 0900 Gross per 24 hour  Intake 1637.75 ml  Output -  Net 1637.75 ml    Additional Objective Labs: Basic Metabolic Panel: Recent Labs  Lab 12/07/17 0646  12/09/17 0307 12/10/17 0620 12/11/17 0442  NA 140   < > 134* 134* 134*  K 3.6   < > 4.4 3.6 3.9  CL 101   < > 100 99 97*  CO2 20*   < > 20* 24 23  GLUCOSE 92   < > 84 82 89  BUN 54*   < > 40* 22 32*  CREATININE 5.37*   < > 5.00* 3.77* 4.91*  CALCIUM 8.3*   < > 8.7* 8.6* 8.7*  PHOS 5.2*  --   --   --   --    < > = values in this interval not displayed.   Liver Function Tests: Recent Labs  Lab 12/07/17 0206  AST 67*  ALT 25  ALKPHOS 166*  BILITOT 1.2  PROT 6.4*  ALBUMIN 2.3*   CBC: Recent Labs  Lab 12/07/17 0646 12/08/17 0154 12/09/17 0307 12/10/17 0620 12/11/17 0442  WBC 6.6 4.9 7.2 6.9 7.1  NEUTROABS  --  3.4 5.1 5.1  --   HGB 8.7* 8.8* 9.1* 9.2* 9.6*  HCT 29.0* 29.1* 31.6* 30.8* 31.9*  MCV 89.0 87.9 91.9 87.5 86.9  PLT 124* 136* 133* 126* 172   Blood Culture    Component Value Date/Time   SDES BLOOD LEFT HAND 12/07/2017 0418   SPECREQUEST  12/07/2017 0418    BOTTLES DRAWN AEROBIC AND ANAEROBIC Blood Culture adequate volume   CULT  12/07/2017 0418    NO GROWTH 4 DAYS Performed at [facility], [place] [address] [place], [place] [address]    REPTSTATUS PENDING 12/07/2017 0418    Cardiac Enzymes: Recent Labs  Lab 12/07/17 0646 12/07/17 1412 12/07/17 2123  TROPONINI 0.65* 1.71* 1.91*   CBG: Recent Labs  Lab 12/10/17 2024 12/11/17 0004 12/11/17 0043 12/11/17 0758 12/11/17 1130  GLUCAP 131* 50* 131* 71 132*    Lab Results  Component Value Date   INR 1.82 12/11/2017   INR 1.93 12/10/2017   INR 2.38 12/10/2017   Studies/Results: No results found.  Medications: . sodium chloride    . sodium chloride    . dextrose 30 mL/hr at 12/11/17 0641  . heparin 1,400 Units/hr (12/11/17 0641)   . amiodarone  400 mg Oral BID  .  Chlorhexidine Gluconate Cloth  6 each Topical Q0600  . Chlorhexidine Gluconate Cloth  6 each Topical Q0600  . darbepoetin (ARANESP) injection - DIALYSIS  60 mcg Intravenous Q Thu-HD  . famotidine  20 mg Per Tube Daily  . insulin aspart  2-6 Units Subcutaneous Q4H  . metoprolol tartrate  12.5 mg Oral BID  . mupirocin ointment  1 application Nasal BID  . sodium chloride flush  3 mL Intravenous Q12H  . traZODone  50 mg Oral QHS    Dialysis Orders: [facility] (recent start) 4h 400/800 79kg 3K/ 2Ca bath Hep 2700 R IJ TDC - mircera 75 every 2 wks last 6/27  Home meds: - coumadin 2mg  hs/ lipitor 10 mg hs - wellbutrin 100mg  qd/ baclofen 5 mg tid prn/ percocet prn/ desyrel 50 hs - protonix 40 qd/ sl ntg prn/ mvi/ allopurinol 300 qd    Assessment/Plan: 1. VTach / witnessed arrest - Cardio following - on amiodarone. Heart cath scheduled today? 2. A. Fib - on coumadin at home. Held for possible heart cath. Per cardio 3. CHF- EF reduced from 30% > 15-20%. Per cardio 4. ESRD - HD TTS - recent start. K 3.9. No urgent/emergent  indications at this time.  PE reassuring. Orders written for HD tomorrow per regular schedule.  5. Anemia of CKD- Hgb 9.6. Continue aranesp 52mcg qwk (thurs) while admitted.  6. Secondary hyperparathyroidism - Ca 8.7, need Phos. Not on VDRA or binders.  7. HTN/volume - BP well controlled. Close to EDW, LE edema on exam, new to HD still establishing EDW, likely weight loss while admitted, will continue challenge and titrate down volume as tolerated.  8. Nutrition - Renal diet with fluid restrictions.  9. Hx colon cancer 10. Hx depression 11. Hx gout   [name], PA-C [facility] Pager: [phone] 12/11/2017,11:50 AM  LOS: 4 days

## 2017-12-11 NOTE — Progress Notes (Signed)
DAILY PROGRESS NOTE   Patient Name: Patrick [name] Date of Encounter: 12/11/2017  Chief Complaint   No complaints  Patient Profile   Patrick [name] is a 67 y.o. 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 No med changes.  Patrick [name] was admitted 07/11 from SNF after he was found on the floor. EMS noted VT when putting him on the stretcher. Adenosine x 3 doses>>unresponsive>>defib. Intubated in the ER.   He is being seen today for the evaluation of arrhythmia at the request of Dr Elwyn Reach  Subjective   Comfortable sitting up in the chair. Anticipating cath today. No further arrhythmias overnight.   Objective   Vitals:   12/10/17 2026 12/11/17 0007 12/11/17 0500 12/11/17 0819  BP: 126/84 120/80 108/73   Pulse:  71 71 72  Resp:  '18 18 19  ' Temp:  (!) 97.5 F (36.4 C) 97.6 F (36.4 C) 97.9 F (36.6 C)  TempSrc:  Oral Oral Oral  SpO2: 100%   97%  Weight:   176 lb 11.2 oz (80.2 kg)   Height:        Intake/Output Summary (Last 24 hours) at 12/11/2017 1203 Last data filed at 12/11/2017 0900 Gross per 24 hour  Intake 1397.75 ml  Output -  Net 1397.75 ml   Filed Weights   12/09/17 1629 12/10/17 0507 12/11/17 0500  Weight: 173 lb 1 oz (78.5 kg) 176 lb 2.4 oz (79.9 kg) 176 lb 11.2 oz (80.2 kg)    Physical Exam   General appearance: alert and no distress Lungs: clear to auscultation bilaterally Heart: regular bradycardia Extremities: extremities normal, atraumatic, no cyanosis or edema Neurologic: Grossly normal  Inpatient Medications    Scheduled Meds: . amiodarone  400 mg Oral BID  . Chlorhexidine Gluconate Cloth  6 each Topical Q0600  . Chlorhexidine Gluconate Cloth  6  each Topical Q0600  . darbepoetin (ARANESP) injection - DIALYSIS  60 mcg Intravenous Q Thu-HD  . famotidine  20 mg Per Tube Daily  . insulin aspart  2-6 Units Subcutaneous Q4H  . metoprolol tartrate  12.5 mg Oral BID  . mupirocin ointment  1 application Nasal BID  . sodium chloride flush  3 mL Intravenous Q12H  . traZODone  50 mg Oral QHS    Continuous Infusions: . sodium chloride    . sodium chloride    . dextrose 30 mL/hr at 12/11/17 0641  . heparin 1,400 Units/hr (12/11/17 0641)    PRN Meds: sodium chloride, acetaminophen, docusate, sodium chloride flush   Labs   Results for orders placed or performed during the hospital encounter of 12/07/17 (from the past 48 hour(s))  Glucose, capillary     Status: Abnormal   Collection Time: 12/09/17  6:49 PM  Result Value Ref Range   Glucose-Capillary 110 (H) 70 - 99 mg/dL  Glucose, capillary     Status: None   Collection Time: 12/09/17  8:08 PM  Result Value Ref Range   Glucose-Capillary 96 70 - 99 mg/dL  Glucose, capillary     Status: None   Collection Time: 12/10/17 12:22 AM  Result Value Ref Range   Glucose-Capillary 94 70 - 99 mg/dL  Glucose, capillary  Status: None   Collection Time: 12/10/17  5:03 AM  Result Value Ref Range   Glucose-Capillary 91 70 - 99 mg/dL  Protime-INR     Status: Abnormal   Collection Time: 12/10/17  6:20 AM  Result Value Ref Range   Prothrombin Time 25.8 (H) 11.4 - 15.2 seconds   INR 2.38     Comment: Performed at [facility] [address], [place], [place] [address]  CBC with Differential/Platelet     Status: Abnormal   Collection Time: 12/10/17  6:20 AM  Result Value Ref Range   WBC 6.9 4.0 - 10.5 K/uL   RBC 3.52 (L) 4.22 - 5.81 MIL/uL   Hemoglobin 9.2 (L) 13.0 - 17.0 g/dL   HCT 30.8 (L) 39.0 - 52.0 %   MCV 87.5 78.0 - 100.0 fL   MCH 26.1 26.0 - 34.0 pg   MCHC 29.9 (L) 30.0 - 36.0 g/dL   RDW 19.3 (H) 11.5 - 15.5 %   Platelets 126 (L) 150 - 400 K/uL   Neutrophils Relative % 75  %   Neutro Abs 5.1 1.7 - 7.7 K/uL   Lymphocytes Relative 10 %   Lymphs Abs 0.7 0.7 - 4.0 K/uL   Monocytes Relative 14 %   Monocytes Absolute 1.0 0.1 - 1.0 K/uL   Eosinophils Relative 1 %   Eosinophils Absolute 0.1 0.0 - 0.7 K/uL   Basophils Relative 0 %   Basophils Absolute 0.0 0.0 - 0.1 K/uL   Immature Granulocytes 0 %   Abs Immature Granulocytes 0.0 0.0 - 0.1 K/uL    Comment: Performed at [facility], [address] [place],  [address]  Basic metabolic panel     Status: Abnormal   Collection Time: 12/10/17  6:20 AM  Result Value Ref Range   Sodium 134 (L) 135 - 145 mmol/L   Potassium 3.6 3.5 - 5.1 mmol/L   Chloride 99 98 - 111 mmol/L    Comment: Please note change in reference range.   CO2 24 22 - 32 mmol/L   Glucose, Bld 82 70 - 99 mg/dL    Comment: Please note change in reference range.   BUN 22 8 - 23 mg/dL    Comment: Please note change in reference range.   Creatinine, Ser 3.77 (H) 0.61 - 1.24 mg/dL   Calcium 8.6 (L) 8.9 - 10.3 mg/dL   GFR calc non Af Amer 15 (L) >60 mL/min   GFR calc Af Amer 17 (L) >60 mL/min    Comment: (NOTE) The eGFR has been calculated using the CKD EPI equation. This calculation has not been validated in all clinical situations. eGFR's persistently <60 mL/min signify possible Chronic Kidney Disease.    Anion gap 11 5 - 15    Comment: Performed at [facility] [address], [place],  [address]  Glucose, capillary     Status: Abnormal   Collection Time: 12/10/17  7:46 AM  Result Value Ref Range   Glucose-Capillary 68 (L) 70 - 99 mg/dL  Glucose, capillary     Status: None   Collection Time: 12/10/17 11:40 AM  Result Value Ref Range   Glucose-Capillary 87 70 - 99 mg/dL  Glucose, capillary     Status: Abnormal   Collection Time: 12/10/17  5:52 PM  Result Value Ref Range   Glucose-Capillary 64 (L) 70 - 99 mg/dL  Protime-INR     Status: Abnormal   Collection Time: 12/10/17  7:58 PM  Result Value  Ref Range    Prothrombin Time 21.9 (H) 11.4 - 15.2 seconds   INR 1.93     Comment: Performed at [facility], [place] [address], [place], [place] [address]  Glucose, capillary     Status: Abnormal   Collection Time: 12/10/17  8:24 PM  Result Value Ref Range   Glucose-Capillary 131 (H) 70 - 99 mg/dL  Glucose, capillary     Status: Abnormal   Collection Time: 12/11/17 12:04 AM  Result Value Ref Range   Glucose-Capillary 50 (L) 70 - 99 mg/dL  Glucose, capillary     Status: Abnormal   Collection Time: 12/11/17 12:43 AM  Result Value Ref Range   Glucose-Capillary 131 (H) 70 - 99 mg/dL  Protime-INR     Status: Abnormal   Collection Time: 12/11/17  4:42 AM  Result Value Ref Range   Prothrombin Time 20.9 (H) 11.4 - 15.2 seconds   INR 1.82     Comment: Performed at [facility], [place] [address], [place], [place] [address]  CBC     Status: Abnormal   Collection Time: 12/11/17  4:42 AM  Result Value Ref Range   WBC 7.1 4.0 - 10.5 K/uL   RBC 3.67 (L) 4.22 - 5.81 MIL/uL   Hemoglobin 9.6 (L) 13.0 - 17.0 g/dL   HCT 31.9 (L) 39.0 - 52.0 %   MCV 86.9 78.0 - 100.0 fL   MCH 26.2 26.0 - 34.0 pg   MCHC 30.1 30.0 - 36.0 g/dL   RDW 19.1 (H) 11.5 - 15.5 %   Platelets 172 150 - 400 K/uL    Comment: Performed at [facility], [place] [address], [place], [place] [address]  Basic metabolic panel     Status: Abnormal   Collection Time: 12/11/17  4:42 AM  Result Value Ref Range   Sodium 134 (L) 135 - 145 mmol/L   Potassium 3.9 3.5 - 5.1 mmol/L   Chloride 97 (L) 98 - 111 mmol/L    Comment: Please note change in reference range.   CO2 23 22 - 32 mmol/L   Glucose, Bld 89 70 - 99 mg/dL    Comment: Please note change in reference range.   BUN 32 (H) 8 - 23 mg/dL    Comment: Please note change in reference range.   Creatinine, Ser 4.91 (H) 0.61 - 1.24 mg/dL   Calcium 8.7 (L) 8.9 - 10.3 mg/dL   GFR calc non Af Amer 11 (L) >60 mL/min   GFR calc Af Amer 13 (L) >60 mL/min    Comment: (NOTE) The eGFR has  been calculated using the CKD EPI equation. This calculation has not been validated in all clinical situations. eGFR's persistently <60 mL/min signify possible Chronic Kidney Disease.    Anion gap 14 5 - 15    Comment: Performed at [facility] [address] [place], [place] [address]  Heparin level (unfractionated)     Status: Abnormal   Collection Time: 12/11/17  4:42 AM  Result Value Ref Range   Heparin Unfractionated <0.10 (L) 0.30 - 0.70 IU/mL    Comment: (NOTE) If heparin results are below expected values, and patient dosage has  been confirmed, suggest follow up testing of antithrombin III levels. Performed at [facility], [place] [address] [place], [place] [address]   Glucose, capillary     Status: None   Collection Time: 12/11/17  7:58 AM  Result Value Ref Range   Glucose-Capillary 71 70 -  99 mg/dL  Glucose, capillary     Status: Abnormal   Collection Time: 12/11/17 11:30 AM  Result Value Ref Range   Glucose-Capillary 132 (H) 70 - 99 mg/dL    ECG   Sinus bradycardia, RBBB - Personally Reviewed  Telemetry   Sinus bradycardia, no VT - Personally Reviewed  Radiology    No results found.  Cardiac Studies   N/A  Assessment   1. Active Problems: 2.   Acute on chronic combined systolic and diastolic CHF (congestive heart failure) ([place]) 3.   Acute respiratory failure with hypoxia ([facility]) 4.   VT (ventricular tachycardia) ([place]) 5.   Respiratory arrest ([place]) 6.   Plan   1. Plan for [facility] today - on HD. INR improved to 1.82. No further VT.   Time Spent Directly with Patient:  I have spent a total of 15 minutes with the patient reviewing hospital notes, telemetry, EKGs, labs and examining the patient as well as establishing an assessment and plan that was discussed personally with the patient.  > 50% of time was spent in direct patient care.  Length of Stay:  LOS: 4 days   Pixie Casino, MD, [facility], [place]  Director of the Advanced Lipid Disorders &  Cardiovascular Risk Reduction Clinic Diplomate of the American Board of Clinical Lipidology Attending Cardiologist  Direct Dial: [phone]  Fax: [phone]  Website:  [email or web address] 12/11/2017, 12:03 PM

## 2017-12-11 NOTE — Progress Notes (Signed)
Per patient's request, informed his outpatient dialysis center that he would not be able to make his dialysis appt tomorrow.

## 2017-12-11 NOTE — Clinical Social Work Note (Signed)
Clinical Social Work Assessment  Patient Details  Name: Patrick [name] MRN: [id number] Date of Birth: 24-Jan-1951  Date of referral:  12/11/17               Reason for consult:  Facility Placement, Discharge Planning                Permission sought to share information with:  Facility Art therapist granted to share information::  Yes, Verbal Permission Granted  Name::        Agency::  [facility]  Relationship::     Contact Information:     Housing/Transportation Living arrangements for the past 2 months:  [place] of Information:  Patient, Facility Patient Interpreter Needed:  None Criminal Activity/Legal Involvement Pertinent to Current Situation/Hospitalization:  No - Comment as needed Significant Relationships:  Siblings Lives with:  Facility Resident Do you feel safe going back to the place where you live?  Yes Need for family participation in patient care:  No (Coment)  Care giving concerns: Patient is long term care resident at [facility].     Social Worker assessment / plan: CSW met with patient at bedside. Patient alert and oriented, sitting up in bedside chair. CSW introduced self and role and discussed disposition planning. Patient frustrated that cath has not been completed yet. Patient agreeable to return to [facility] at discharge. He stated that his sister also lives at the facility. CSW to follow for medical readiness and will support with discharge.  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  Patient/Family's Understanding of and Emotional Response to Diagnosis, Current Treatment, and Prognosis: Patient with understanding of condition and agreeable to return to SNF at  discharge.  Emotional Assessment Appearance:  Appears stated age Attitude/Demeanor/Rapport:  Engaged Affect (typically observed):  Accepting, Calm, Appropriate Orientation:  Oriented to Self, Oriented to Place, Oriented to  Time, Oriented to Situation Alcohol / Substance use:  Not Applicable Psych involvement (Current and /or in the community):  No (Comment)  Discharge Needs  Concerns to be addressed:  Discharge Planning Concerns Readmission within the last 30 days:  No Current discharge risk:  Physical Impairment Barriers to Discharge:  Continued Medical Work up   Estanislado Emms, LCSW 12/11/2017, 1:20 PM

## 2017-12-11 NOTE — Progress Notes (Signed)
[facility] for IV heparin Indication: atrial fibrillation  Allergies  Allergen Reactions  . Nsaids     Kidney disease    Patient Measurements: Height: 5\' 7"  (170.2 cm)(measured at bedside by RT) Weight: 176 lb 11.2 oz (80.2 kg) IBW/kg (Calculated) : 66.1 Heparin Dosing Weight: 80.2 kg  Vital Signs: Temp: 97.6 F (36.4 C) (07/15 1939) Temp Source: Oral (07/15 1939) BP: 121/78 (07/15 2158) Pulse Rate: 60 (07/15 1939)  Labs: Recent Labs    12/09/17 0307 12/10/17 0620 12/10/17 1958 12/11/17 0442 12/11/17 1229 12/11/17 2251  HGB 9.1* 9.2*  --  9.6*  --   --   HCT 31.6* 30.8*  --  31.9*  --   --   PLT 133* 126*  --  172  --   --   LABPROT 31.4* 25.8* 21.9* 20.9* 21.5*  --   INR 3.06 2.38 1.93 1.82 1.89  --   HEPARINUNFRC  --   --   --  <0.10* <0.10* 0.29*  CREATININE 5.00* 3.77*  --  4.91*  --   --     Estimated Creatinine Clearance: 14.4 mL/min (A) (by C-G formula based on SCr of 4.91 mg/dL (H)).   Medical History: Past Medical History:  Diagnosis Date  . Arthritis    "right hand; right knee" (06/19/2014)  . CAD (coronary artery disease)    2v CAD with subtotal occlusion of small co-dominant RCA and borderline lesion in moderate-sized OM-1  . Chronic systolic (congestive) heart failure ([place])    Preserved EF 2016, has been between 25% and 45% since then  . Coronary artery disease involving native coronary artery of native heart with angina pectoris ([place]) 09/11/2016  . Degenerative joint disease of knee, right   . Dysrhythmia   . Enlarged prostate   . Hyperlipidemia   . Hypertension   . Pneumonia 05/2014  . Prediabetes 10/03/2014  . Scrotal edema 04/03/2015  . SMALL BOWEL OBSTRUCTION, HX OF 08/21/2007   Annotation: with narrowing in the ileocecal region Qualifier: Diagnosis of  By: Hassell Done FNP, Tori Milks      Medications:  Scheduled:  . amiodarone  400 mg Oral BID  . Chlorhexidine Gluconate Cloth  6 each Topical Q0600  .  [START ON 12/12/2017] Chlorhexidine Gluconate Cloth  6 each Topical Q0600  . darbepoetin (ARANESP) injection - DIALYSIS  60 mcg Intravenous Q Thu-HD  . famotidine  20 mg Oral Daily  . insulin aspart  2-6 Units Subcutaneous Q4H  . metoprolol tartrate  12.5 mg Oral BID  . mupirocin ointment  1 application Nasal BID  . sodium chloride flush  3 mL Intravenous Q12H  . traZODone  50 mg Oral QHS    Assessment: 67 yo male admitted with VT.  On chronic Coumadin for afib with INR 3.02 on admission.  Last PTA Coumadin dose recorded as 12/06/17. Pharmacy consulted to dose heparin when INR<2 - started 7/14 PM.  Heparin level now 0.29 units/ml  Goal of Therapy:  Heparin level 0.3-0.7 units/ml Monitor platelets by anticoagulation protocol: Yes   Plan:  Increase heparin to 1750 units/hr Monitor daily heparin level and CBC, s/sx bleeding Planning cath 7/16, f/u resume warfarin post-op as appropriate  Thanks for allowing pharmacy to be a part of this patient's care.  Excell Seltzer, PharmD Clinical Pharmacist

## 2017-12-11 NOTE — Progress Notes (Signed)
ANTICOAGULATION CONSULT NOTE - Follow Up Consult  Pharmacy Consult for heparin Indication: atrial fibrillation  Labs: Recent Labs    12/09/17 0307 12/10/17 0620 12/10/17 1958 12/11/17 0442  HGB 9.1* 9.2*  --  9.6*  HCT 31.6* 30.8*  --  31.9*  PLT 133* 126*  --  172  LABPROT 31.4* 25.8* 21.9* 20.9*  INR 3.06 2.38 1.93 1.82  HEPARINUNFRC  --   --   --  <0.10*  CREATININE 5.00* 3.77*  --  4.91*    Assessment: 67yo male undetectable on heparin with initial dosing while Coumadin on hold.  Goal of Therapy:  Heparin level 0.3-0.7 units/ml   Plan:  Will increase heparin gtt by 3-4 units/kg/hr to 1400 units/hr and check level in 8 hours.    Wynona Neat, PharmD, BCPS  12/11/2017,6:31 AM

## 2017-12-11 NOTE — Progress Notes (Signed)
[facility] for IV heparin Indication: atrial fibrillation  Allergies  Allergen Reactions  . Nsaids     Kidney disease    Patient Measurements: Height: 5\' 7"  (170.2 cm)(measured at bedside by RT) Weight: 176 lb 11.2 oz (80.2 kg) IBW/kg (Calculated) : 66.1 Heparin Dosing Weight: 80.2 kg  Vital Signs: Temp: 97.9 F (36.6 C) (07/15 1324) Temp Source: Oral (07/15 1324) BP: 108/73 (07/15 0500) Pulse Rate: 71 (07/15 1324)  Labs: Recent Labs    12/09/17 0307 12/10/17 0620 12/10/17 1958 12/11/17 0442 12/11/17 1229  HGB 9.1* 9.2*  --  9.6*  --   HCT 31.6* 30.8*  --  31.9*  --   PLT 133* 126*  --  172  --   LABPROT 31.4* 25.8* 21.9* 20.9* 21.5*  INR 3.06 2.38 1.93 1.82 1.89  HEPARINUNFRC  --   --   --  <0.10* <0.10*  CREATININE 5.00* 3.77*  --  4.91*  --     Estimated Creatinine Clearance: 14.4 mL/min (A) (by C-G formula based on SCr of 4.91 mg/dL (H)).   Medical History: Past Medical History:  Diagnosis Date  . Arthritis    "right hand; right knee" (06/19/2014)  . CAD (coronary artery disease)    2v CAD with subtotal occlusion of small co-dominant RCA and borderline lesion in moderate-sized OM-1  . Chronic systolic (congestive) heart failure ([place])    Preserved EF 2016, has been between 25% and 45% since then  . Coronary artery disease involving native coronary artery of native heart with angina pectoris ([place]) 09/11/2016  . Degenerative joint disease of knee, right   . Dysrhythmia   . Enlarged prostate   . Hyperlipidemia   . Hypertension   . Pneumonia 05/2014  . Prediabetes 10/03/2014  . Scrotal edema 04/03/2015  . SMALL BOWEL OBSTRUCTION, HX OF 08/21/2007   Annotation: with narrowing in the ileocecal region Qualifier: Diagnosis of  By: Hassell Done FNP, Tori Milks      Medications:  Scheduled:  . amiodarone  400 mg Oral BID  . Chlorhexidine Gluconate Cloth  6 each Topical Q0600  . [START ON 12/12/2017] Chlorhexidine Gluconate Cloth  6  each Topical Q0600  . darbepoetin (ARANESP) injection - DIALYSIS  60 mcg Intravenous Q Thu-HD  . famotidine  20 mg Oral Daily  . insulin aspart  2-6 Units Subcutaneous Q4H  . metoprolol tartrate  12.5 mg Oral BID  . mupirocin ointment  1 application Nasal BID  . sodium chloride flush  3 mL Intravenous Q12H  . traZODone  50 mg Oral QHS    Assessment: 67 yo male admitted with VT.  On chronic Coumadin for afib with INR 3.02 on admission.  Last PTA Coumadin dose recorded as 12/06/17. Pharmacy consulted to dose heparin when INR<2 - started 7/14 PM.  Heparin level remains undetectable despite rate increase. Hgb low but stable, plt improved. No s/sx of bleeding or issues with infusion per discussion with RN.  Goal of Therapy:  Heparin level 0.3-0.7 units/ml Monitor platelets by anticoagulation protocol: Yes   Plan:  Increase heparin to 1650 units/hr 8h heparin level Monitor daily heparin level and CBC, s/sx bleeding Planning cath 7/16, f/u resume warfarin post-op as appropriate  Elicia Lamp, PharmD, BCPS Clinical Pharmacist Clinical phone [phone] Please check AMION for all [facility] contact numbers 12/11/2017 3:23 PM

## 2017-12-11 NOTE — Progress Notes (Signed)
[facility]  PROGRESS NOTE  Patrick STTHOMAS ZJI:[id number] DOB: June 12, 1950 DOA: 12/07/2017 PCP: Patient, [name]   Brief HPI:   67 year old male with a history of CHF, CAD, hypertension, diabetes mellitus, ESRD on hemodialysis, atrial fibrillation, colon cancer status post colostomy was brought to the ED after patient had a witnessed VT arrest status post successful resuscitation.  He was intubated and was sent to the [facility] service.  He was extubated on 12/08/2017 transferred to try to hospitalist service on 12/10/2017.  Cardiology and nephrology are following.    Subjective   Patient seen and examined, denies chest pain or shortness of breath.   Assessment/Plan:     1. Status post VT arrest-successful resuscitation, patient is now extubated.  Continue amiodarone left heart cath planned for today.. 2. Atrial fibrillation-heart rate is controlled, warfarin is on hold, today INR is 1.82. 3. ESRD-on hemodialysis, nephrology following 4. Diabetes mellitus-blood glucose controlled, continue sliding scale insulin with NovoLog. 5. History of colon cancer-status post colostomy 6. 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  CAD 7. CAD-history of subtotally occluded codominant RCA,  did have elevated troponin.  No active chest pain.  Cardiology following.  Plan for LHC today.    DVT prophylaxis: INR greater than 2  Code Status: Full code  Family Communication: No family at bedside  Disposition Plan: likely home when medically ready for discharge   Consultants:  PCCM  Cardiology  Nephrology  Procedures:  None   Antibiotics:   Anti-infectives (From admission, onward)   None       Objective   Vitals:   12/11/17 0007 12/11/17 0500 12/11/17 0819 12/11/17 1324  BP: 120/80 108/73    Pulse: 71 71 72 71  Resp: 18 18 19 18   Temp: (!) 97.5 F (36.4 C) 97.6 F (36.4 C) 97.9 F (36.6 C) 97.9 F (36.6 C)  TempSrc: Oral Oral Oral  Oral  SpO2:   97% 98%  Weight:  80.2 kg (176 lb 11.2 oz)    Height:        Intake/Output Summary (Last 24 hours) at 12/11/2017 1408 Last data filed at 12/11/2017 0900 Gross per 24 hour  Intake 1397.75 ml  Output -  Net 1397.75 ml   Filed Weights   12/09/17 1629 12/10/17 0507 12/11/17 0500  Weight: 78.5 kg (173 lb 1 oz) 79.9 kg (176 lb 2.4 oz) 80.2 kg (176 lb 11.2 oz)     Physical Examination:      General: Appears in no acute distress  Cardiovascular: S1-S2, regular  Respiratory: Clear to auscultation bilaterally  Abdomen: Soft, nontender, no organomegaly  Musculoskeletal: No edema in the lower extremities      Data Reviewed: I have personally reviewed following labs and imaging studies  CBG: Recent Labs  Lab 12/10/17 2024 12/11/17 0004 12/11/17 0043 12/11/17 0758 12/11/17 1130  GLUCAP 131* 50* 131* 71 132*    CBC: Recent Labs  Lab 12/07/17 0206  12/07/17 0646 12/08/17 0154 12/09/17 0307 12/10/17 0620 12/11/17 0442  WBC 6.1  --  6.6 4.9 7.2 6.9 7.1  NEUTROABS 4.7  --   --  3.4 5.1 5.1  --   HGB 9.4*   < > 8.7* 8.8* 9.1* 9.2* 9.6*  HCT 32.1*   < > 29.0* 29.1* 31.6* 30.8* 31.9*  MCV 89.9  --  89.0 87.9 91.9 87.5 86.9  PLT 157  --  124* 136* 133* 126* 172   < > = values in this interval  not displayed.    Basic Metabolic Panel: Recent Labs  Lab 12/07/17 0206  12/07/17 6270 12/08/17 0154 12/09/17 0307 12/09/17 0509 12/10/17 0620 12/11/17 0442  NA 139   < > 140 139 134*  --  134* 134*  K 4.6   < > 3.6 3.3* 4.4  --  3.6 3.9  CL 104   < > 101 103 100  --  99 97*  CO2 16*  --  20* 25 20*  --  24 23  GLUCOSE 156*   < > 92 77 84  --  82 89  BUN 53*   < > 54* 30* 40*  --  22 32*  CREATININE 5.00*   < > 5.37* 3.75* 5.00*  --  3.77* 4.91*  CALCIUM 8.1*  --  8.3* 8.7* 8.7*  --  8.6* 8.7*  MG 1.8  --  1.9  --   --  2.1  --   --   PHOS  --   --  5.2*  --   --   --   --   --    < > = values in this interval not displayed.    Recent Results  (from the past 240 hour(s))  Culture, blood (routine x 2)     Status: None (Preliminary result)   Collection Time: 12/07/17  4:00 AM  Result Value Ref Range Status   Specimen Description BLOOD RIGHT HAND  Final   Special Requests   Final    BOTTLES DRAWN AEROBIC AND ANAEROBIC Blood Culture adequate volume   Culture   Final    NO GROWTH 4 DAYS Performed at [facility], [address], [place], [place] [address]    Report Status PENDING  Incomplete  Culture, blood (routine x 2)     Status: None (Preliminary result)   Collection Time: 12/07/17  4:18 AM  Result Value Ref Range Status   Specimen Description BLOOD LEFT HAND  Final   Special Requests   Final    BOTTLES DRAWN AEROBIC AND ANAEROBIC Blood Culture adequate volume   Culture   Final    NO GROWTH 4 DAYS Performed at [facility], [place] [address], [place], [place] [address]    Report Status PENDING  Incomplete  MRSA PCR Screening     Status: Abnormal   Collection Time: 12/07/17  6:04 AM  Result Value Ref Range Status   MRSA by PCR POSITIVE (A) NEGATIVE Final    Comment:        The GeneXpert MRSA Assay (FDA approved for NASAL specimens only), is one component of a comprehensive MRSA colonization surveillance program. It is not intended to diagnose MRSA infection nor to guide or monitor treatment for MRSA infections. CRITICAL RESULT CALLED TO, READ BACK BY AND VERIFIED WITH: RN C RICE 12/07/17 AT 815 BY CM Performed at [facility], [place] [address], [place], [place] [address]      Liver Function Tests: Recent Labs  Lab 12/07/17 0206  AST 67*  ALT 25  ALKPHOS 166*  BILITOT 1.2  PROT 6.4*  ALBUMIN 2.3*   No results for input(s): LIPASE, AMYLASE in the last 168 hours. No results for input(s): AMMONIA in the last 168 hours.  Cardiac Enzymes: Recent Labs  Lab 12/07/17 0646 12/07/17 1412 12/07/17 2123  TROPONINI 0.65* 1.71* 1.91*   BNP (last 3 results) Recent Labs    05/23/17 1332  BNP  487.5*    ProBNP (last 3 results)  No results for input(s): PROBNP in the last 8760 hours.    Studies: No results found.  Scheduled Meds: . amiodarone  400 mg Oral BID  . Chlorhexidine Gluconate Cloth  6 each Topical Q0600  . [START ON 12/12/2017] Chlorhexidine Gluconate Cloth  6 each Topical Q0600  . darbepoetin (ARANESP) injection - DIALYSIS  60 mcg Intravenous Q Thu-HD  . famotidine  20 mg Oral Daily  . insulin aspart  2-6 Units Subcutaneous Q4H  . metoprolol tartrate  12.5 mg Oral BID  . mupirocin ointment  1 application Nasal BID  . sodium chloride flush  3 mL Intravenous Q12H  . traZODone  50 mg Oral QHS      Time spent: 25 min  Oswald Hillock   [facility] Pager [phone]. If 7PM-7AM, please contact night-coverage at [email or web address], Office  [phone]  password [email or web address]  12/11/2017, 2:08 PM  LOS: 4 days

## 2017-12-12 DIAGNOSIS — E119 Type 2 diabetes mellitus without complications: Secondary | ICD-10-CM

## 2017-12-12 LAB — GLUCOSE, CAPILLARY
GLUCOSE-CAPILLARY: 91 mg/dL (ref 70–99)
GLUCOSE-CAPILLARY: 102 mg/dL — AB (ref 70–99)
GLUCOSE-CAPILLARY: 74 mg/dL (ref 70–99)
GLUCOSE-CAPILLARY: 112 mg/dL — AB (ref 70–99)
Glucose-Capillary: 61 mg/dL — ABNORMAL LOW (ref 70–99)

## 2017-12-12 LAB — CULTURE, BLOOD (ROUTINE X 2)
CULTURE: NO GROWTH
Culture: NO GROWTH
SPECIAL REQUESTS: ADEQUATE
SPECIAL REQUESTS: ADEQUATE

## 2017-12-12 LAB — BASIC METABOLIC PANEL
Anion gap: 13 (ref 5–15)
BUN: 41 mg/dL — ABNORMAL HIGH (ref 8–23)
CHLORIDE: 96 mmol/L — AB (ref 98–111)
CO2: 23 mmol/L (ref 22–32)
Calcium: 8.6 mg/dL — ABNORMAL LOW (ref 8.9–10.3)
Creatinine, Ser: 5.92 mg/dL — ABNORMAL HIGH (ref 0.61–1.24)
GFR calc non Af Amer: 9 mL/min — ABNORMAL LOW (ref >60)
GFR, EST AFRICAN AMERICAN: 10 mL/min — AB (ref >60)
Glucose, Bld: 89 mg/dL (ref 70–99)
POTASSIUM: 3.8 mmol/L (ref 3.5–5.1)
SODIUM: 132 mmol/L — AB (ref 135–145)

## 2017-12-12 LAB — CBC
HEMATOCRIT: 30 % — AB (ref 39.0–52.0)
Hemoglobin: 9.1 g/dL — ABNORMAL LOW (ref 13.0–17.0)
MCH: 26.3 pg (ref 26.0–34.0)
MCHC: 30.3 g/dL (ref 30.0–36.0)
MCV: 86.7 fL (ref 78.0–100.0)
Platelets: 141 10*3/uL — ABNORMAL LOW (ref 150–400)
RBC: 3.46 MIL/uL — AB (ref 4.22–5.81)
RDW: 19.1 % — ABNORMAL HIGH (ref 11.5–15.5)
WBC: 5.8 10*3/uL (ref 4.0–10.5)

## 2017-12-12 LAB — PROTIME-INR
INR: 1.86
INR: 1.84
PROTHROMBIN TIME: 21.2 s — AB (ref 11.4–15.2)
Prothrombin Time: 21.1 seconds — ABNORMAL HIGH (ref 11.4–15.2)

## 2017-12-12 LAB — HEPARIN LEVEL (UNFRACTIONATED)
Heparin Unfractionated: 0.27 IU/mL — ABNORMAL LOW (ref 0.30–0.70)
Heparin Unfractionated: 0.38 IU/mL (ref 0.30–0.70)

## 2017-12-12 MED ORDER — HEPARIN SODIUM (PORCINE) 1000 UNIT/ML DIALYSIS: 1000.0000 [IU] | INTRAMUSCULAR | Status: DC | PRN

## 2017-12-12 MED ORDER — ALTEPLASE 2 MG IJ SOLR: 2.0000 mg | Freq: Once | INTRAMUSCULAR | Status: DC | PRN

## 2017-12-12 MED ORDER — SODIUM CHLORIDE 0.9 % IV SOLN: 100.0000 mL | INTRAVENOUS | Status: DC | PRN

## 2017-12-12 MED ORDER — LIDOCAINE HCL (PF) 1 % IJ SOLN: 5.0000 mL | INTRAMUSCULAR | Status: DC | PRN

## 2017-12-12 MED ORDER — PENTAFLUOROPROP-TETRAFLUOROETH EX AERO: 1.0000 "application " | INHALATION_SPRAY | CUTANEOUS | Status: DC | PRN

## 2017-12-12 MED ORDER — LIDOCAINE-PRILOCAINE 2.5-2.5 % EX CREA: 1.0000 "application " | TOPICAL_CREAM | CUTANEOUS | Status: DC | PRN

## 2017-12-12 MED ORDER — DEXTROSE 50 % IV SOLN
25.0000 mL | Freq: Once | INTRAVENOUS | Status: AC
  Administered 2017-12-12: 25 mL via INTRAVENOUS

## 2017-12-12 MED ORDER — DEXTROSE 50 % IV SOLN
INTRAVENOUS | Status: AC
  Filled 2017-12-12: qty 50

## 2017-12-12 MED ORDER — ASPIRIN 81 MG PO CHEW
81.0000 mg | CHEWABLE_TABLET | ORAL | Status: AC
  Administered 2017-12-12: 81 mg via ORAL
  Filled 2017-12-12: qty 1

## 2017-12-12 NOTE — Consult Note (Signed)
   [facility] CM Inpatient Consult   12/12/2017  SOHAN POTVIN [birth year] [id number]   Patient screened for extreme high risk score for unplanned readmission in the  [facility] services with [facility] plan.  Chart reviewed and patient is a long term resident at  [facility],  prior to admission. Admitted with Acute hypoxic respiratory failure and was intubated. Patient's disposition is to return to a skilled facility per inpatient CSW notes.  [facility] Care Management will sign off.  For questions contact:   Natividad Brood, RN BSN [facility] Liaison  [phone] business mobile phone Toll free office [phone]

## 2017-12-12 NOTE — Progress Notes (Signed)
[facility]  PROGRESS NOTE  JEDD SCHULENBURG PYK:[id number] DOB: 06/06/1950 DOA: 12/07/2017 PCP: Patient, [name]   Brief HPI:   67 year old male with a history of CHF, CAD, hypertension, diabetes mellitus, ESRD on hemodialysis, atrial fibrillation, colon cancer status post colostomy was brought to the ED after patient had a witnessed VT arrest status post successful resuscitation.  He was intubated and was sent to the [facility] service.  He was extubated on 12/08/2017 transferred to try to hospitalist service on 12/10/2017.  Cardiology and nephrology are following.    Subjective   Patient seen and examined at hemodialysis.He denies chest pian, shortness of breath   Assessment/Plan:     1. Status post VT arrest-successful resuscitation, patient is now extubated.  Continue amiodarone left heart cath planned for today. 2. Atrial fibrillation-heart rate is controlled, warfarin is on hold, today INR is 1.82. 3. ESRD-on hemodialysis, nephrology following 4. Diabetes mellitus-blood glucose controlled, continue sliding scale insulin with NovoLog. 5. History of colon cancer-status post colostomy 6. 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   7. CAD-history of subtotally occluded codominant RCA,  did have elevated troponin.  No active chest pain.  Cardiology following. Plan for LHC today.    DVT prophylaxis: INR greater than 2  Code Status: Full code  Family Communication: No family at bedside  Disposition Plan: likely home when medically ready for discharge   Consultants:  PCCM  Cardiology  Nephrology  Procedures:  None   Antibiotics:   Anti-infectives (From admission, onward)   None       Objective   Vitals:   12/12/17 1000 12/12/17 1030 12/12/17 1100 12/12/17 1130  BP: (!) 104/48 (!) 104/53 (!) 106/54 114/66  Pulse: 67 67 67 67  Resp:      Temp:      TempSrc:      SpO2:      Weight:      Height:         Intake/Output Summary (Last 24 hours) at 12/12/2017 1336 Last data filed at 12/12/2017 0609 Gross per 24 hour  Intake 1166.5 ml  Output 150 ml  Net 1016.5 ml   Filed Weights   12/11/17 0500 12/12/17 0300 12/12/17 0740  Weight: 80.2 kg (176 lb 11.2 oz) 81.6 kg (180 lb) 82.8 kg (182 lb 8.7 oz)     Physical Examination:    Neck: Supple, no deformities, masses, or tenderness Lungs: Normal respiratory effort, bilateral clear to auscultation, no crackles or wheezes.  Heart: Regular rate and rhythm, S1 and S2 normal, no murmurs, rubs auscultated Abdomen: BS normoactive,soft,nondistended,non-tender to palpation,no organomegaly Extremities: No pretibial edema, no erythema, no cyanosis, no clubbing Neuro : Alert and oriented to time, place and person, No focal deficits       Data Reviewed: I have personally reviewed following labs and imaging studies  CBG: Recent Labs  Lab 12/11/17 1943 12/11/17 2353 12/12/17 0352 12/12/17 1156 12/12/17 1224  GLUCAP 94 111* 91 61* 102*    CBC: Recent Labs  Lab 12/07/17 0206  12/08/17 0154 12/09/17 0307 12/10/17 0620 12/11/17 0442 12/12/17 0650  WBC 6.1   < > 4.9 7.2 6.9 7.1 5.8  NEUTROABS 4.7  --  3.4 5.1 5.1  --   --   HGB 9.4*   < > 8.8* 9.1* 9.2* 9.6* 9.1*  HCT 32.1*   < > 29.1* 31.6* 30.8* 31.9* 30.0*  MCV 89.9   < > 87.9 91.9 87.5 86.9 86.7  PLT 157   < >  136* 133* 126* 172 141*   < > = values in this interval not displayed.    Basic Metabolic Panel: Recent Labs  Lab 12/07/17 0206  12/07/17 5361 12/08/17 0154 12/09/17 0307 12/09/17 0509 12/10/17 0620 12/11/17 0442 12/12/17 0650  NA 139   < > 140 139 134*  --  134* 134* 132*  K 4.6   < > 3.6 3.3* 4.4  --  3.6 3.9 3.8  CL 104   < > 101 103 100  --  99 97* 96*  CO2 16*  --  20* 25 20*  --  24 23 23   GLUCOSE 156*   < > 92 77 84  --  82 89 89  BUN 53*   < > 54* 30* 40*  --  22 32* 41*  CREATININE 5.00*   < > 5.37* 3.75* 5.00*  --  3.77* 4.91* 5.92*  CALCIUM 8.1*   --  8.3* 8.7* 8.7*  --  8.6* 8.7* 8.6*  MG 1.8  --  1.9  --   --  2.1  --   --   --   PHOS  --   --  5.2*  --   --   --   --   --   --    < > = values in this interval not displayed.    Recent Results (from the past 240 hour(s))  Culture, blood (routine x 2)     Status: None (Preliminary result)   Collection Time: 12/07/17  4:00 AM  Result Value Ref Range Status   Specimen Description BLOOD RIGHT HAND  Final   Special Requests   Final    BOTTLES DRAWN AEROBIC AND ANAEROBIC Blood Culture adequate volume   Culture   Final    NO GROWTH 4 DAYS Performed at [facility], [address], [place], [place] [address]    Report Status PENDING  Incomplete  Culture, blood (routine x 2)     Status: None (Preliminary result)   Collection Time: 12/07/17  4:18 AM  Result Value Ref Range Status   Specimen Description BLOOD LEFT HAND  Final   Special Requests   Final    BOTTLES DRAWN AEROBIC AND ANAEROBIC Blood Culture adequate volume   Culture   Final    NO GROWTH 4 DAYS Performed at [facility], [place] [address], [place], [place] [address]    Report Status PENDING  Incomplete  MRSA PCR Screening     Status: Abnormal   Collection Time: 12/07/17  6:04 AM  Result Value Ref Range Status   MRSA by PCR POSITIVE (A) NEGATIVE Final    Comment:        The GeneXpert MRSA Assay (FDA approved for NASAL specimens only), is one component of a comprehensive MRSA colonization surveillance program. It is not intended to diagnose MRSA infection nor to guide or monitor treatment for MRSA infections. CRITICAL RESULT CALLED TO, READ BACK BY AND VERIFIED WITH: RN C RICE 12/07/17 AT 815 BY CM Performed at [facility], [place] [address], [place], [place] [address]      Liver Function Tests: Recent Labs  Lab 12/07/17 0206  AST 67*  ALT 25  ALKPHOS 166*  BILITOT 1.2  PROT 6.4*  ALBUMIN 2.3*   No results for input(s): LIPASE, AMYLASE in the last 168 hours. No results for input(s):  AMMONIA in the last 168 hours.  Cardiac Enzymes: Recent Labs  Lab 12/07/17 0646 12/07/17 1412  12/07/17 2123  TROPONINI 0.65* 1.71* 1.91*   BNP (last 3 results) Recent Labs    05/23/17 1332  BNP 487.5*    ProBNP (last 3 results) No results for input(s): PROBNP in the last 8760 hours.    Studies: No results found.  Scheduled Meds: . amiodarone  400 mg Oral BID  . Chlorhexidine Gluconate Cloth  6 each Topical Q0600  . darbepoetin (ARANESP) injection - DIALYSIS  60 mcg Intravenous Q Thu-HD  . dextrose      . famotidine  20 mg Oral Daily  . insulin aspart  2-6 Units Subcutaneous Q4H  . metoprolol tartrate  12.5 mg Oral BID  . sodium chloride flush  3 mL Intravenous Q12H  . traZODone  50 mg Oral QHS      Time spent: 25 min  [facility] Pager [phone]. If 7PM-7AM, please contact night-coverage at [email or web address], Office  [phone]  password [email or web address]  12/12/2017, 1:36 PM  LOS: 5 days

## 2017-12-12 NOTE — Consult Note (Signed)
[facility] Nurse ostomy consult note Stoma type/location: LLQ colostomy (end, permanent). Dr. Clyda Greener, 09/2016. Patient unable to tell Nursing staff on admission what supplies he was using and he is wearing a urostomy pouching system upon my arrival/assessment. Seal is intact, pouch is full of flatus and stool. Stomal assessment/size: 1 and 5/8 inch x 2 inch oval, red, moist.  Mostly flush with abdomen. Parastomal hernia. Increased intra-abdominal pressure creates stoma movement and size change. Peristomal assessment: intact, clear Treatment options for stomal/peristomal skin: skin barrier ring Output: soft brown stool with flatus Ostomy pouching: 1pc. Education provided: None today. Supplies left at bedside, 4 pouches and 4 skin barrier rings, pattern and stoma sizing guide. Enrolled patient in [facility] program: No. Patient resides in a SNF and they provide ostomy pouching supplies.  [facility] nursing team will not follow, but will remain available to this patient, the nursing and medical teams.  Please re-consult if needed. Thanks, Maudie Flakes, MSN, RN, [place], Arther Abbott  Pager# [phone]

## 2017-12-12 NOTE — Progress Notes (Signed)
[place] for IV heparin Indication: atrial fibrillation  Allergies  Allergen Reactions  . Nsaids     Kidney disease    Patient Measurements: Height: 5\' 7"  (170.2 cm)(measured at bedside by RT) Weight: 176 lb 5.9 oz (80 kg) IBW/kg (Calculated) : 66.1 Heparin Dosing Weight: 80.2 kg  Vital Signs: Temp: 97.6 F (36.4 C) (07/16 1627) Temp Source: Axillary (07/16 1627) BP: 116/71 (07/16 1627) Pulse Rate: 58 (07/16 1144)  Labs: Recent Labs    12/10/17 0620  12/11/17 0442 12/11/17 1229 12/11/17 2251 12/12/17 0650 12/12/17 1327 12/12/17 1811  HGB 9.2*  --  9.6*  --   --  9.1*  --   --   HCT 30.8*  --  31.9*  --   --  30.0*  --   --   PLT 126*  --  172  --   --  141*  --   --   LABPROT 25.8*   < > 20.9* 21.5*  --  21.2* 21.1*  --   INR 2.38   < > 1.82 1.89  --  1.86 1.84  --   HEPARINUNFRC  --    < > <0.10* <0.10* 0.29* 0.27*  --  0.38  CREATININE 3.77*  --  4.91*  --   --  5.92*  --   --    < > = values in this interval not displayed.    Estimated Creatinine Clearance: 11.9 mL/min (A) (by C-G formula based on SCr of 5.92 mg/dL (H)).  Assessment: 67 yo male admitted with VT.  On chronic Coumadin for afib with INR 3.02 on admission.  Last PTA Coumadin dose recorded as 12/06/17. Heparin started 7/14 PM when INR <2.  Heparin level now at goal. Patient is to go for cath today to evaluate cause of VTach.  Postponed d/t HD. Hgb and plts stable, no bleeding noted  Goal of Therapy:  Heparin level 0.3-0.7 units/ml Monitor platelets by anticoagulation protocol: Yes   Plan:  Continue IV heparin at current rate. Recheck heparin level in AM. Monitor daily heparin level and CBC, s/sx bleeding Planning cath 7/16, f/u resume warfarin post-op as appropriate  Lauren D. Bajbus, PharmD, BCPS Clinical Pharmacist [phone] Please check AMION for all [place] numbers 12/12/2017 7:40 PM

## 2017-12-12 NOTE — Progress Notes (Signed)
[place] for IV heparin Indication: atrial fibrillation  Allergies  Allergen Reactions  . Nsaids     Kidney disease    Patient Measurements: Height: 5\' 7"  (170.2 cm)(measured at bedside by RT) Weight: 182 lb 8.7 oz (82.8 kg) IBW/kg (Calculated) : 66.1 Heparin Dosing Weight: 80.2 kg  Vital Signs: Temp: 97.5 F (36.4 C) (07/16 0740) Temp Source: Axillary (07/16 0740) BP: 122/70 (07/16 0800) Pulse Rate: 69 (07/16 0800)  Labs: Recent Labs    12/10/17 0620  12/11/17 0442 12/11/17 1229 12/11/17 2251 12/12/17 0650  HGB 9.2*  --  9.6*  --   --  9.1*  HCT 30.8*  --  31.9*  --   --  30.0*  PLT 126*  --  172  --   --  141*  LABPROT 25.8*   < > 20.9* 21.5*  --  21.2*  INR 2.38   < > 1.82 1.89  --  1.86  HEPARINUNFRC  --    < > <0.10* <0.10* 0.29* 0.27*  CREATININE 3.77*  --  4.91*  --   --  5.92*   < > = values in this interval not displayed.    Estimated Creatinine Clearance: 12.1 mL/min (A) (by C-G formula based on SCr of 5.92 mg/dL (H)).  Assessment: 66 yo male admitted with VT.  On chronic Coumadin for afib with INR 3.02 on admission.  Last PTA Coumadin dose recorded as 12/06/17. Heparin started 7/14 PM when INR <2.  Heparin level now still low at 0.27 units/ml despite rate increases overnight.  Patient is to go for cath today to evaluate cause of VTach. Hgb and plts stable, no bleeding noted  Goal of Therapy:  Heparin level 0.3-0.7 units/ml Monitor platelets by anticoagulation protocol: Yes   Plan:  Increase heparin to 1850 units/hr Recheck heparin level in 8 hours Monitor daily heparin level and CBC, s/sx bleeding Planning cath 7/16, f/u resume warfarin post-op as appropriate  Marqus Macphee D. Kathey Simer, PharmD, BCPS Clinical Pharmacist [phone] Please check AMION for all [place] numbers 12/12/2017 8:53 AM

## 2017-12-12 NOTE — Procedures (Signed)
I was present at this dialysis session. I have reviewed the session itself and made appropriate changes.   LHC postponed to post HD today. 3K bath.  3L UF goal 3.8kg up on EDW.  TDC.   Filed Weights   12/11/17 0500 12/12/17 0300 12/12/17 0740  Weight: 80.2 kg (176 lb 11.2 oz) 81.6 kg (180 lb) 82.8 kg (182 lb 8.7 oz)    Recent Labs  Lab 12/07/17 0646  12/12/17 0650  NA 140   < > 132*  K 3.6   < > 3.8  CL 101   < > 96*  CO2 20*   < > 23  GLUCOSE 92   < > 89  BUN 54*   < > 41*  CREATININE 5.37*   < > 5.92*  CALCIUM 8.3*   < > 8.6*  PHOS 5.2*  --   --    < > = values in this interval not displayed.    Recent Labs  Lab 12/08/17 0154 12/09/17 0307 12/10/17 0620 12/11/17 0442 12/12/17 0650  WBC 4.9 7.2 6.9 7.1 5.8  NEUTROABS 3.4 5.1 5.1  --   --   HGB 8.8* 9.1* 9.2* 9.6* 9.1*  HCT 29.1* 31.6* 30.8* 31.9* 30.0*  MCV 87.9 91.9 87.5 86.9 86.7  PLT 136* 133* 126* 172 141*    Scheduled Meds: . amiodarone  400 mg Oral BID  . Chlorhexidine Gluconate Cloth  6 each Topical Q0600  . darbepoetin (ARANESP) injection - DIALYSIS  60 mcg Intravenous Q Thu-HD  . famotidine  20 mg Oral Daily  . insulin aspart  2-6 Units Subcutaneous Q4H  . metoprolol tartrate  12.5 mg Oral BID  . mupirocin ointment  1 application Nasal BID  . sodium chloride flush  3 mL Intravenous Q12H  . traZODone  50 mg Oral QHS   Continuous Infusions: . sodium chloride    . sodium chloride 10 mL/hr at 12/12/17 0658  . dextrose 30 mL/hr at 12/12/17 0609  . heparin 1,750 Units/hr (12/12/17 0609)   PRN Meds:.sodium chloride, acetaminophen, docusate, sodium chloride flush   Pearson Grippe  MD 12/12/2017, 8:34 AM

## 2017-12-12 NOTE — Progress Notes (Signed)
LHC postponed to p dialysis today. Will follow-up after cath.  Pixie Casino, MD, [facility], [place] Director of the [facility]  Card[facility]ovascular [facility] Diplomate of the American Board of Clinical Lipidology Attending Cardiologist  Direct Dial: [phone]  Fax: [phone]  Website:  [email or web address]

## 2017-12-13 ENCOUNTER — Encounter (HOSPITAL_COMMUNITY)
Admission: EM | Disposition: A | Discharge status: Skilled Nursing Facility | Payer: Self-pay | Source: Skilled Nursing Facility | Attending: Family Medicine

## 2017-12-13 HISTORY — PX: LEFT HEART CATH AND CORONARY ANGIOGRAPHY: CATH118249

## 2017-12-13 LAB — CBC
HCT: 31.1 % — ABNORMAL LOW (ref 39.0–52.0)
HEMOGLOBIN: 9.5 g/dL — AB (ref 13.0–17.0)
MCH: 26.6 pg (ref 26.0–34.0)
MCHC: 30.5 g/dL (ref 30.0–36.0)
MCV: 87.1 fL (ref 78.0–100.0)
PLATELETS: 157 10*3/uL (ref 150–400)
RBC: 3.57 MIL/uL — AB (ref 4.22–5.81)
RDW: 19.5 % — ABNORMAL HIGH (ref 11.5–15.5)
WBC: 6.2 10*3/uL (ref 4.0–10.5)

## 2017-12-13 LAB — GLUCOSE, CAPILLARY
GLUCOSE-CAPILLARY: 56 mg/dL — AB (ref 70–99)
GLUCOSE-CAPILLARY: 58 mg/dL — AB (ref 70–99)
GLUCOSE-CAPILLARY: 63 mg/dL — AB (ref 70–99)
GLUCOSE-CAPILLARY: 72 mg/dL (ref 70–99)
GLUCOSE-CAPILLARY: 76 mg/dL (ref 70–99)
Glucose-Capillary: 76 mg/dL (ref 70–99)
Glucose-Capillary: 64 mg/dL — ABNORMAL LOW (ref 70–99)
Glucose-Capillary: 111 mg/dL — ABNORMAL HIGH (ref 70–99)

## 2017-12-13 LAB — PROTIME-INR
INR: 1.78
PROTHROMBIN TIME: 20.5 s — AB (ref 11.4–15.2)

## 2017-12-13 LAB — POCT ACTIVATED CLOTTING TIME: Activated Clotting Time: 175 seconds

## 2017-12-13 LAB — HEPARIN LEVEL (UNFRACTIONATED): Heparin Unfractionated: 0.46 IU/mL (ref 0.30–0.70)

## 2017-12-13 SURGERY — LEFT HEART CATH AND CORONARY ANGIOGRAPHY
Anesthesia: LOCAL

## 2017-12-13 MED ORDER — LIDOCAINE HCL (PF) 1 % IJ SOLN
INTRAMUSCULAR | Status: AC
  Filled 2017-12-13: qty 30

## 2017-12-13 MED ORDER — MEXILETINE HCL 150 MG PO CAPS
150.0000 mg | ORAL_CAPSULE | Freq: Two times a day (BID) | ORAL | Status: DC
  Administered 2017-12-13 – 2017-12-17 (×8): 150 mg via ORAL
  Filled 2017-12-13 (×9): qty 1

## 2017-12-13 MED ORDER — HEPARIN (PORCINE) IN NACL 100-0.45 UNIT/ML-% IJ SOLN
1850.0000 [IU]/h | INTRAMUSCULAR | Status: DC
  Administered 2017-12-13 – 2017-12-15 (×3): 1850 [IU]/h via INTRAVENOUS
  Filled 2017-12-13 (×3): qty 250

## 2017-12-13 MED ORDER — SODIUM CHLORIDE 0.9% FLUSH
3.0000 mL | Freq: Two times a day (BID) | INTRAVENOUS | Status: DC
  Administered 2017-12-13 – 2017-12-16 (×5): 3 mL via INTRAVENOUS

## 2017-12-13 MED ORDER — LIDOCAINE HCL (PF) 1 % IJ SOLN
INTRAMUSCULAR | Status: DC | PRN
  Administered 2017-12-13: 14 mL

## 2017-12-13 MED ORDER — CHLORHEXIDINE GLUCONATE CLOTH 2 % EX PADS
6.0000 | MEDICATED_PAD | Freq: Every day | CUTANEOUS | Status: DC
  Administered 2017-12-13 – 2017-12-17 (×5): 6 via TOPICAL

## 2017-12-13 MED ORDER — ASPIRIN 81 MG PO CHEW
81.0000 mg | CHEWABLE_TABLET | Freq: Once | ORAL | Status: AC
  Administered 2017-12-13: 81 mg via ORAL
  Filled 2017-12-13: qty 1

## 2017-12-13 MED ORDER — HEPARIN (PORCINE) IN NACL 1000-0.9 UT/500ML-% IV SOLN
INTRAVENOUS | Status: DC | PRN
  Administered 2017-12-13 (×2): 500 mL

## 2017-12-13 MED ORDER — MIDAZOLAM HCL 2 MG/2ML IJ SOLN
INTRAMUSCULAR | Status: AC
  Filled 2017-12-13: qty 2

## 2017-12-13 MED ORDER — MIDAZOLAM HCL 2 MG/2ML IJ SOLN
INTRAMUSCULAR | Status: DC | PRN
  Administered 2017-12-13: 0.5 mg via INTRAVENOUS

## 2017-12-13 MED ORDER — SODIUM CHLORIDE 0.9 % IV SOLN: 250.0000 mL | INTRAVENOUS | Status: DC | PRN

## 2017-12-13 MED ORDER — FENTANYL CITRATE (PF) 100 MCG/2ML IJ SOLN
INTRAMUSCULAR | Status: AC
  Filled 2017-12-13: qty 2

## 2017-12-13 MED ORDER — ONDANSETRON HCL 4 MG/2ML IJ SOLN
4.0000 mg | Freq: Four times a day (QID) | INTRAMUSCULAR | Status: DC | PRN
Start: 2017-12-13 — End: 2017-12-17

## 2017-12-13 MED ORDER — DEXTROSE 50 % IV SOLN
INTRAVENOUS | Status: AC
  Administered 2017-12-13: 25 mL
  Filled 2017-12-13: qty 50

## 2017-12-13 MED ORDER — SODIUM CHLORIDE 0.9 % IV SOLN
INTRAVENOUS | Status: DC
  Administered 2017-12-13: 07:00:00 via INTRAVENOUS

## 2017-12-13 MED ORDER — HEPARIN (PORCINE) IN NACL 1000-0.9 UT/500ML-% IV SOLN
INTRAVENOUS | Status: AC
  Filled 2017-12-13: qty 1000

## 2017-12-13 MED ORDER — RANOLAZINE ER 500 MG PO TB12
500.0000 mg | ORAL_TABLET | Freq: Two times a day (BID) | ORAL | Status: DC
  Administered 2017-12-13 – 2017-12-17 (×8): 500 mg via ORAL
  Filled 2017-12-13 (×8): qty 1

## 2017-12-13 MED ORDER — FENTANYL CITRATE (PF) 100 MCG/2ML IJ SOLN
INTRAMUSCULAR | Status: DC | PRN
  Administered 2017-12-13: 25 ug via INTRAVENOUS

## 2017-12-13 MED ORDER — PHYTONADIONE 5 MG PO TABS
2.5000 mg | ORAL_TABLET | ORAL | Status: AC
  Administered 2017-12-13: 2.5 mg via ORAL
  Filled 2017-12-13: qty 1

## 2017-12-13 MED ORDER — IOHEXOL 350 MG/ML SOLN
INTRAVENOUS | Status: DC | PRN
  Administered 2017-12-13: 55 mL via INTRA_ARTERIAL

## 2017-12-13 MED ORDER — SODIUM CHLORIDE 0.9% FLUSH: 3.0000 mL | INTRAVENOUS | Status: DC | PRN

## 2017-12-13 SURGICAL SUPPLY — 9 items
CATH INFINITI 5FR MULTPACK ANG (CATHETERS) ×1 IMPLANT
COVER PRB 48X5XTLSCP FOLD TPE (BAG) IMPLANT
COVER PROBE 5X48 (BAG) ×2
ELECT DEFIB PAD ADLT CADENCE (PAD) ×1 IMPLANT
KIT HEART LEFT (KITS) ×2 IMPLANT
PACK CARDIAC CATHETERIZATION (CUSTOM PROCEDURE TRAY) ×2 IMPLANT
SHEATH PINNACLE 5F 10CM (SHEATH) ×1 IMPLANT
TRANSDUCER W/STOPCOCK (MISCELLANEOUS) ×2 IMPLANT
WIRE EMERALD 3MM-J .035X150CM (WIRE) ×1 IMPLANT

## 2017-12-13 NOTE — Progress Notes (Signed)
ANTICOAGULATION CONSULT NOTE - Follow Up Consult  Pharmacy Consult for Heparin (Coumadin on hold) Indication: atrial fibrillation  Allergies  Allergen Reactions  . Nsaids     Kidney disease    Patient Measurements: Height: 5\' 7"  (170.2 cm)(measured at bedside by RT) Weight: 176 lb 12.8 oz (80.2 kg) IBW/kg (Calculated) : 66.1 Heparin Dosing Weight: 80 kg  Vital Signs: Temp: 97.5 F (36.4 C) (07/17 1705) Temp Source: Oral (07/17 1705) BP: 108/75 (07/17 1705) Pulse Rate: 58 (07/17 1450)  Labs: Recent Labs    12/11/17 0442  12/12/17 0650 12/12/17 1327 12/12/17 1811 12/13/17 0351  HGB 9.6*  --  9.1*  --   --  9.5*  HCT 31.9*  --  30.0*  --   --  31.1*  PLT 172  --  141*  --   --  157  LABPROT 20.9*   < > 21.2* 21.1*  --  20.5*  INR 1.82   < > 1.86 1.84  --  1.78  HEPARINUNFRC <0.10*   < > 0.27*  --  0.38 0.46  CREATININE 4.91*  --  5.92*  --   --   --    < > = values in this interval not displayed.    Estimated Creatinine Clearance: 11.9 mL/min (A) (by C-G formula based on SCr of 5.92 mg/dL (H)).  Assessment:  67 yo male admitted with VT.  On chronic Coumadin for afib with INR 3.02 on admission 12/07/17.  Last PTA Coumadin dose recorded as 12/06/17. Heparin started 7/14 PM when INR <2.      S/p cardiac cath.  Heparin to resume 8 hrs after sheath out. Sheath out ~3pm. No bleeding or hematoma noted.  Last heparin level was therapeutic (0.46) on 1850 units/hr. INR 1.78.  EP evaluated.  Noted leaning towards medical therapy.   Goal of Therapy:  Heparin level 0.3-0.7 units/ml Monitor platelets by anticoagulation protocol: Yes   Plan:   Resume heparin drip ~11pm at 1850 units/hr.  Heparin level ~8 hrs after drip resumes.  Daily heparin level, PT/INR and CBC.  Follow up plans and resuming Coumadin if no procedure.  Arty Baumgartner, [place] Pager: [phone] or phone: [phone] 12/13/2017,5:55 PM

## 2017-12-13 NOTE — Progress Notes (Signed)
DAILY PROGRESS NOTE   Patient Name: Patrick [name] Date of Encounter: 12/13/2017  Chief Complaint   No complaints  Patient Profile   Patrick [name] is a 67 y.o. 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 No med changes.  Patrick [name] was admitted 07/11 from SNF after he was found on the floor. EMS noted VT when putting him on the stretcher. Adenosine x 3 doses>>unresponsive>>defib. Intubated in the ER.   He is being seen today for the evaluation of arrhythmia at the request of Dr Elwyn Reach  Subjective   Dialysis yesterday - INR remains too high for groin cath. Per Dr. Martinique, recommended to give low dose Vitamin K today and will likely cath this afternoon.  Objective   Vitals:   12/12/17 2100 12/13/17 0036 12/13/17 0407 12/13/17 0744  BP:  (!) 112/97 115/77 118/79  Pulse: 68 69 63   Resp:  16 (!) 21   Temp:  98.2 F (36.8 C) 97.7 F (36.5 C) (!) 97.5 F (36.4 C)  TempSrc:  Oral Oral Oral  SpO2:  100% 100% 100%  Weight:   176 lb 12.8 oz (80.2 kg)   Height:        Intake/Output Summary (Last 24 hours) at 12/13/2017 0917 Last data filed at 12/13/2017 0500 Gross per 24 hour  Intake 1199.09 ml  Output 2919 ml  Net -1719.91 ml   Filed Weights   12/12/17 0740 12/12/17 1144 12/13/17 0407  Weight: 182 lb 8.7 oz (82.8 kg) 176 lb 5.9 oz (80 kg) 176 lb 12.8 oz (80.2 kg)    Physical Exam   General appearance: alert and no distress Lungs: clear to auscultation bilaterally Heart: regular bradycardia Extremities: extremities normal, atraumatic, no cyanosis or edema Neurologic: Grossly normal  Inpatient Medications    Scheduled Meds: . amiodarone  400 mg Oral BID  . Chlorhexidine  Gluconate Cloth  6 each Topical Q0600  . darbepoetin (ARANESP) injection - DIALYSIS  60 mcg Intravenous Q Thu-HD  . famotidine  20 mg Oral Daily  . insulin aspart  2-6 Units Subcutaneous Q4H  . metoprolol tartrate  12.5 mg Oral BID  . phytonadione  2.5 mg Oral NOW  . sodium chloride flush  3 mL Intravenous Q12H  . traZODone  50 mg Oral QHS    Continuous Infusions: . sodium chloride    . sodium chloride 10 mL/hr at 12/12/17 0658  . sodium chloride 10 mL/hr at 12/13/17 0658  . dextrose 30 mL/hr at 12/13/17 0500  . heparin 1,850 Units/hr (12/13/17 0500)    PRN Meds: sodium chloride, acetaminophen, docusate, sodium chloride flush   Labs   Results for orders placed or performed during the hospital encounter of 12/07/17 (from the past 48 hour(s))  Glucose, capillary     Status: Abnormal   Collection Time: 12/11/17 11:30 AM  Result Value Ref Range   Glucose-Capillary 132 (H) 70 - 99 mg/dL  Heparin level (unfractionated)     Status: Abnormal   Collection Time: 12/11/17 12:29 PM  Result Value Ref Range   Heparin Unfractionated <0.10 (L) 0.30 - 0.70 IU/mL    Comment: (NOTE) If heparin results are below expected values, and patient dosage has  been confirmed, suggest follow up testing of antithrombin III levels. Performed at [facility], [place] [address], [place], [place] [address]   Protime-INR     Status: Abnormal   Collection Time: 12/11/17 12:29 PM  Result Value Ref Range   Prothrombin Time 21.5 (H) 11.4 - 15.2 seconds   INR 1.89     Comment: Performed at [facility] [address], [place], [place] [address]  Glucose, capillary     Status: None   Collection Time: 12/11/17  4:33 PM  Result Value Ref Range   Glucose-Capillary 87 70 - 99 mg/dL  Glucose, capillary     Status: None   Collection Time: 12/11/17  7:43 PM  Result Value Ref Range   Glucose-Capillary 94 70 - 99 mg/dL  Heparin level (unfractionated)     Status: Abnormal   Collection Time: 12/11/17  10:51 PM  Result Value Ref Range   Heparin Unfractionated 0.29 (L) 0.30 - 0.70 IU/mL    Comment: (NOTE) If heparin results are below expected values, and patient dosage has  been confirmed, suggest follow up testing of antithrombin III levels. Performed at [facility], [place] [address], [place], [place] [address]   Glucose, capillary     Status: Abnormal   Collection Time: 12/11/17 11:53 PM  Result Value Ref Range   Glucose-Capillary 111 (H) 70 - 99 mg/dL  Glucose, capillary     Status: None   Collection Time: 12/12/17  3:52 AM  Result Value Ref Range   Glucose-Capillary 91 70 - 99 mg/dL  Protime-INR     Status: Abnormal   Collection Time: 12/12/17  6:50 AM  Result Value Ref Range   Prothrombin Time 21.2 (H) 11.4 - 15.2 seconds   INR 1.86     Comment: Performed at [facility], [place] [address], [place], [place] [address]  CBC     Status: Abnormal   Collection Time: 12/12/17  6:50 AM  Result Value Ref Range   WBC 5.8 4.0 - 10.5 K/uL   RBC 3.46 (L) 4.22 - 5.81 MIL/uL   Hemoglobin 9.1 (L) 13.0 - 17.0 g/dL   HCT 30.0 (L) 39.0 - 52.0 %   MCV 86.7 78.0 - 100.0 fL   MCH 26.3 26.0 - 34.0 pg   MCHC 30.3 30.0 - 36.0 g/dL   RDW 19.1 (H) 11.5 - 15.5 %   Platelets 141 (L) 150 - 400 K/uL    Comment: Performed at [facility], [place] [address], [place], [place] [address]  Basic metabolic panel     Status: Abnormal   Collection Time: 12/12/17  6:50 AM  Result Value Ref Range   Sodium 132 (L) 135 - 145 mmol/L   Potassium 3.8 3.5 - 5.1 mmol/L   Chloride 96 (L) 98 - 111 mmol/L    Comment: Please note change in reference range.   CO2 23 22 - 32 mmol/L   Glucose, Bld 89 70 - 99 mg/dL    Comment: Please note change in reference range.   BUN 41 (H) 8 - 23 mg/dL    Comment: Please note change in reference range.   Creatinine, Ser 5.92 (H) 0.61 - 1.24 mg/dL   Calcium 8.6 (L) 8.9 - 10.3 mg/dL   GFR calc non Af Amer 9 (L) >60 mL/min   GFR calc Af Amer 10 (L) >60 mL/min     Comment: (NOTE) The eGFR has been calculated using the CKD EPI equation. This  calculation has not been validated in all clinical situations. eGFR's persistently <60 mL/min signify possible Chronic Kidney Disease.    Anion gap 13 5 - 15    Comment: Performed at [facility] [address], [place], [place] [address]  Heparin level (unfractionated)     Status: Abnormal   Collection Time: 12/12/17  6:50 AM  Result Value Ref Range   Heparin Unfractionated 0.27 (L) 0.30 - 0.70 IU/mL    Comment: (NOTE) If heparin results are below expected values, and patient dosage has  been confirmed, suggest follow up testing of antithrombin III levels. Performed at [facility], [place] [address], [place], [place] [address]   Glucose, capillary     Status: Abnormal   Collection Time: 12/12/17 11:56 AM  Result Value Ref Range   Glucose-Capillary 61 (L) 70 - 99 mg/dL  Glucose, capillary     Status: Abnormal   Collection Time: 12/12/17 12:24 PM  Result Value Ref Range   Glucose-Capillary 102 (H) 70 - 99 mg/dL  Protime-INR     Status: Abnormal   Collection Time: 12/12/17  1:27 PM  Result Value Ref Range   Prothrombin Time 21.1 (H) 11.4 - 15.2 seconds   INR 1.84     Comment: Performed at [facility], [place] [address] [place], [place] [address]  Glucose, capillary     Status: None   Collection Time: 12/12/17  4:24 PM  Result Value Ref Range   Glucose-Capillary 74 70 - 99 mg/dL  Heparin level (unfractionated)     Status: None   Collection Time: 12/12/17  6:11 PM  Result Value Ref Range   Heparin Unfractionated 0.38 0.30 - 0.70 IU/mL    Comment: (NOTE) If heparin results are below expected values, and patient dosage has  been confirmed, suggest follow up testing of antithrombin III levels. Performed at [facility], [place] [address] [place], [place] [address]   Glucose, capillary     Status: Abnormal   Collection Time: 12/12/17  9:00 PM  Result Value Ref Range    Glucose-Capillary 112 (H) 70 - 99 mg/dL  Glucose, capillary     Status: None   Collection Time: 12/13/17 12:45 AM  Result Value Ref Range   Glucose-Capillary 72 70 - 99 mg/dL  Protime-INR     Status: Abnormal   Collection Time: 12/13/17  3:51 AM  Result Value Ref Range   Prothrombin Time 20.5 (H) 11.4 - 15.2 seconds   INR 1.78     Comment: Performed at [facility], [place] [address], [place], [place] [address]  CBC     Status: Abnormal   Collection Time: 12/13/17  3:51 AM  Result Value Ref Range   WBC 6.2 4.0 - 10.5 K/uL   RBC 3.57 (L) 4.22 - 5.81 MIL/uL   Hemoglobin 9.5 (L) 13.0 - 17.0 g/dL   HCT 31.1 (L) 39.0 - 52.0 %   MCV 87.1 78.0 - 100.0 fL   MCH 26.6 26.0 - 34.0 pg   MCHC 30.5 30.0 - 36.0 g/dL   RDW 19.5 (H) 11.5 - 15.5 %   Platelets 157 150 - 400 K/uL    Comment: Performed at [facility], [place] [address], [place], [place] [address]  Heparin level (unfractionated)     Status: None   Collection Time: 12/13/17  3:51 AM  Result Value Ref Range   Heparin Unfractionated 0.46 0.30 - 0.70 IU/mL    Comment: (NOTE) If  heparin results are below expected values, and patient dosage has  been confirmed, suggest follow up testing of antithrombin III levels. Performed at [facility], [place] [address] [place],  [address]   Glucose, capillary     Status: None   Collection Time: 12/13/17  4:24 AM  Result Value Ref Range   Glucose-Capillary 76 70 - 99 mg/dL  Glucose, capillary     Status: None   Collection Time: 12/13/17  7:42 AM  Result Value Ref Range   Glucose-Capillary 76 70 - 99 mg/dL    ECG   N/A  Telemetry   Sinus bradycardia - Personally Reviewed  Radiology    No results found.  Cardiac Studies   N/A  Assessment   Active Problems:   Acute on chronic combined systolic and diastolic CHF (congestive heart failure) (HCC)   Acute respiratory failure with hypoxia (HCC)   VT (ventricular tachycardia) (HCC)   Respiratory arrest  ([place])   Plan   1. Plan for LHC today - on HD. INR improved to 1.78. No further VT. Will give 2.5 mg Vit K today and plan cath later this afternoon.  Time Spent Directly with Patient:  I have spent a total of 15 minutes with the patient reviewing hospital notes, telemetry, EKGs, labs and examining the patient as well as establishing an assessment and plan that was discussed personally with the patient.  > 50% of time was spent in direct patient care.  Length of Stay:  LOS: 6 days   Pixie Casino, MD, [facility], [place] Director of the [facility] Lipid Disorders &  Cardiovascular Risk Reduction [facility] Diplomate of the American Board of Clinical Lipidology Attending Cardiologist  Direct Dial: [phone]  Fax: [phone]  Website:  [email or web address] 12/13/2017, 9:17 AM

## 2017-12-13 NOTE — Interval H&P Note (Signed)
History and Physical Interval Note:  12/13/2017 1:24 PM  Patrick [name]  has presented today for surgery, with the diagnosis of CP  The various methods of treatment have been discussed with the patient and family. After consideration of risks, benefits and other options for treatment, the patient has consented to  Procedure(s): LEFT HEART CATH AND CORONARY ANGIOGRAPHY (N/A) as a surgical intervention .  The patient's history has been reviewed, patient examined, no change in status, stable for surgery.  I have reviewed the patient's chart and labs.  Questions were answered to the patient's satisfaction.   Cath Lab Visit (complete for each Cath Lab visit)  Clinical Evaluation Leading to the Procedure:   ACS: Yes.    Non-ACS:    Anginal Classification: CCS IV  Anti-ischemic medical therapy: Minimal Therapy (1 class of medications)  Non-Invasive Test Results: No non-invasive testing performed  Prior CABG: No previous CABG        Collier Salina [facility] 12/13/2017 1:25 PM

## 2017-12-13 NOTE — Progress Notes (Signed)
Trainer for IV heparin Indication: atrial fibrillation  Allergies  Allergen Reactions  . Nsaids     Kidney disease    Patient Measurements: Height: 5\' 7"  (170.2 cm)(measured at bedside by RT) Weight: 176 lb 12.8 oz (80.2 kg) IBW/kg (Calculated) : 66.1 Heparin Dosing Weight: 80.2 kg  Vital Signs: Temp: 97.5 F (36.4 C) (07/17 0744) Temp Source: Oral (07/17 0744) BP: 118/79 (07/17 0744) Pulse Rate: 63 (07/17 0407)  Labs: Recent Labs    12/11/17 0442  12/12/17 0650 12/12/17 1327 12/12/17 1811 12/13/17 0351  HGB 9.6*  --  9.1*  --   --  9.5*  HCT 31.9*  --  30.0*  --   --  31.1*  PLT 172  --  141*  --   --  157  LABPROT 20.9*   < > 21.2* 21.1*  --  20.5*  INR 1.82   < > 1.86 1.84  --  1.78  HEPARINUNFRC <0.10*   < > 0.27*  --  0.38 0.46  CREATININE 4.91*  --  5.92*  --   --   --    < > = values in this interval not displayed.    Estimated Creatinine Clearance: 11.9 mL/min (A) (by C-G formula based on SCr of 5.92 mg/dL (H)).  Assessment: 67 yo male admitted with VT.  On chronic Coumadin for afib with INR 3.02 on admission.  Last PTA Coumadin dose recorded as 12/06/17. Heparin started 7/14 PM when INR <2.  Heparin level now at goal. Patient is to go for cath today to evaluate cause of VTach.  Postponed d/t HD. Hgb and plts stable, no bleeding noted  Goal of Therapy:  Heparin level 0.3-0.7 units/ml Monitor platelets by anticoagulation protocol: Yes   Plan:  Continue IV heparin at current rate. Recheck heparin level in AM. Monitor daily heparin level and CBC, s/sx bleeding Planning cath 7/17, f/u resume warfarin post-op as appropriate  Vertis Kelch, PharmD PGY1 Pharmacy Resident Phone [phone] 12/13/2017       8:23 AM

## 2017-12-13 NOTE — NC FL2 (Signed)
[place] LEVEL OF CARE SCREENING TOOL     IDENTIFICATION  Patient Name: Patrick [name] Birthdate: 07-19-1950 Sex: male Admission Date (Current Location): 12/07/2017  [facility] and [place] Number:  Herbalist and Address:  [facility], [place] [address] [place] [address], [place], [place] [address]      Provider Number: [id number]  Attending Physician Name and Address:  Roxan Hockey, MD  Relative Name and Phone Number:  Avrum Kimball, sister, [phone]    Current Level of Care: Hospital Recommended Level of Care: [place] Prior Approval Number:    Date Approved/Denied:   PASRR Number: [id number]  Discharge Plan: SNF    Current Diagnoses: Patient Active Problem List   Diagnosis Date Noted  . Respiratory arrest ([place])   . VT (ventricular tachycardia) ([place]) 12/07/2017  . Shock circulatory ([place]) 10/06/2017  . Acute renal disease   . Compromised airway   . Acute respiratory failure with hypoxia ([place])   . Community acquired bacterial pneumonia 05/24/2017  . Renal cell carcinoma, left ([place]) 05/24/2017  . Liver cirrhosis ([place]) 05/24/2017  . Pneumonia 05/23/2017  . Balance problem 03/07/2017  . Pulmonary HTN ([place]) 11/10/2016  . History of resection of rectum 09/28/2016  . Rectal cancer ([place]) 09/28/2016  . Atrial fibrillation ([place]) 09/11/2016  . Coronary artery disease involving native coronary artery of native heart with angina pectoris ([place]) 09/11/2016  . Nonischemic cardiomyopathy ([place]) 09/11/2016  . Thrombocytopenia ([place]) 05/29/2016  . Adenocarcinoma of rectum s/p robotic APR/colostomy 09/28/2016 04/14/2016  . Chronic kidney disease (CKD), stage III (moderate) ([place]) 02/05/2016  . Abnormal finding on cardiovascular stress test 02/05/2016  . Cardiomyopathy, ischemic 06/17/2015  . Acute on chronic combined systolic and diastolic CHF (congestive heart failure) ([place]) 06/17/2015  . Major depressive disorder, recurrent episode ([place])  04/17/2015  . Hypokalemia 08/29/2014  . Other iron deficiency anemias   . Elevated troponin I measurement   . Encounter for central line placement 03/11/2014  . Obesity, unspecified 10/03/2013  . Dyslipidemia 08/13/2013  . MACULAR DEGENERATION 05/02/2008  . ERECTILE DYSFUNCTION 03/13/2007  . Essential hypertension 01/16/2007  . BPH (benign prostatic hyperplasia) 01/16/2007    Orientation RESPIRATION BLADDER Height & Weight     Self, Situation, Place, Time  Normal Indwelling catheter Weight: 176 lb 12.8 oz (80.2 kg) Height:  5\' 7"  (170.2 cm)(measured at bedside by RT)  BEHAVIORAL SYMPTOMS/MOOD NEUROLOGICAL BOWEL NUTRITION STATUS      Colostomy Diet(please see DC summary)  AMBULATORY STATUS COMMUNICATION OF NEEDS Skin   (baseline) Verbally Normal                       Personal Care Assistance Level of Assistance  Bathing, Feeding, Dressing(baseline)           Functional Limitations Info  Sight, Hearing, Speech Sight Info: Adequate Hearing Info: Adequate Speech Info: Adequate    SPECIAL CARE FACTORS FREQUENCY                       Contractures Contractures Info: Not present    Additional Factors Info  Code Status, Allergies, Psychotropic, Insulin Sliding Scale, Isolation Precautions Code Status Info: Full Allergies Info: NSAIDs Psychotropic Info: trazadone Insulin Sliding Scale Info: novolog Q4hrs Isolation Precautions Info: contact precautions, VRE     Current Medications (12/13/2017):  This is the current hospital active medication list Current Facility-Administered Medications  Medication Dose Route Frequency Provider Last Rate Last Dose  . 0.9 %  sodium chloride infusion  250 mL Intravenous PRN Barrett, Rhonda G, PA-C      . 0.9 %  sodium chloride infusion   Intravenous Continuous Martinique, Peter M, MD 10 mL/hr at 12/12/17 [phone]    . 0.9 %  sodium chloride infusion   Intravenous Continuous Martinique, Peter M, MD 10 mL/hr at 12/13/17 [phone]    .  acetaminophen (TYLENOL) tablet 650 mg  650 mg Oral Q6H PRN Hammonds, Sharyn Blitz, MD   650 mg at 12/08/17 2010  . amiodarone (PACERONE) tablet 400 mg  400 mg Oral BID Skeet Latch, MD   400 mg at 12/13/17 0942  . Chlorhexidine Gluconate Cloth 2 % PADS 6 each  6 each Topical Q0600 Pen[place], [place], [place]      . Darbepoetin Alfa (ARANESP) injection 60 mcg  60 mcg Intravenous Q Thu-HD Roney Jaffe, MD   60 mcg at 12/07/17 1431  . dextrose 10 % infusion   Intravenous Continuous Deterding, Guadelupe Sabin, MD 30 mL/hr at 12/13/17 0500    . docusate (COLACE) 50 MG/5ML liquid 100 mg  100 mg Per Tube BID PRN Hammonds, Sharyn Blitz, MD      . famotidine (PEPCID) tablet 20 mg  20 mg Oral Daily Oswald Hillock, MD   20 mg at 12/13/17 0943  . heparin ADULT infusion 100 units/mL (25000 units/262mL sodium chloride 0.45%)  1,850 Units/hr Intravenous Continuous Bajbus, Lauren D, RPH 18.5 mL/hr at 12/13/17 0500 1,850 Units/hr at 12/13/17 0500  . insulin aspart (novoLOG) injection 2-6 Units  2-6 Units Subcutaneous Q4H Hammonds, Sharyn Blitz, MD   2 Units at 12/10/17 2037  . metoprolol tartrate (LOPRESSOR) tablet 12.5 mg  12.5 mg Oral BID Satira Sark, MD   12.5 mg at 12/13/17 0942  . sodium chloride flush (NS) 0.9 % injection 3 mL  3 mL Intravenous Q12H Barrett, Rhonda G, PA-C   3 mL at 12/13/17 0946  . sodium chloride flush (NS) 0.9 % injection 3 mL  3 mL Intravenous PRN Barrett, Rhonda G, PA-C   3 mL at 12/09/17 0944  . traZODone (DESYREL) tablet 50 mg  50 mg Oral QHS Hammonds, Sharyn Blitz, MD   50 mg at 12/12/17 2101     Discharge Medications: Please see discharge summary for a list of discharge medications.  Relevant Imaging Results:  Relevant Lab Results:   Additional Information SSN: [id number]; HD TTS [place], [place]

## 2017-12-13 NOTE — H&P (View-Only) (Signed)
DAILY PROGRESS NOTE   Patient Name: Patrick [name] Date of Encounter: 12/13/2017  Chief Complaint   No complaints  Patient Profile   Patrick [name] is a 67 y.o. 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 No med changes.  Patrick [name] was admitted 07/11 from SNF after he was found on the floor. EMS noted VT when putting him on the stretcher. Adenosine x 3 doses>>unresponsive>>defib. Intubated in the ER.   He is being seen today for the evaluation of arrhythmia at the request of Dr Elwyn Reach  Subjective   Dialysis yesterday - INR remains too high for groin cath. Per Dr. Martinique, recommended to give low dose Vitamin K today and will likely cath this afternoon.  Objective   Vitals:   12/12/17 2100 12/13/17 0036 12/13/17 0407 12/13/17 0744  BP:  (!) 112/97 115/77 118/79  Pulse: 68 69 63   Resp:  16 (!) 21   Temp:  98.2 F (36.8 C) 97.7 F (36.5 C) (!) 97.5 F (36.4 C)  TempSrc:  Oral Oral Oral  SpO2:  100% 100% 100%  Weight:   176 lb 12.8 oz (80.2 kg)   Height:        Intake/Output Summary (Last 24 hours) at 12/13/2017 0917 Last data filed at 12/13/2017 0500 Gross per 24 hour  Intake 1199.09 ml  Output 2919 ml  Net -1719.91 ml   Filed Weights   12/12/17 0740 12/12/17 1144 12/13/17 0407  Weight: 182 lb 8.7 oz (82.8 kg) 176 lb 5.9 oz (80 kg) 176 lb 12.8 oz (80.2 kg)    Physical Exam   General appearance: alert and no distress Lungs: clear to auscultation bilaterally Heart: regular bradycardia Extremities: extremities normal, atraumatic, no cyanosis or edema Neurologic: Grossly normal  Inpatient Medications    Scheduled Meds: . amiodarone  400 mg Oral BID  . Chlorhexidine  Gluconate Cloth  6 each Topical Q0600  . darbepoetin (ARANESP) injection - DIALYSIS  60 mcg Intravenous Q Thu-HD  . famotidine  20 mg Oral Daily  . insulin aspart  2-6 Units Subcutaneous Q4H  . metoprolol tartrate  12.5 mg Oral BID  . phytonadione  2.5 mg Oral NOW  . sodium chloride flush  3 mL Intravenous Q12H  . traZODone  50 mg Oral QHS    Continuous Infusions: . sodium chloride    . sodium chloride 10 mL/hr at 12/12/17 0658  . sodium chloride 10 mL/hr at 12/13/17 0658  . dextrose 30 mL/hr at 12/13/17 0500  . heparin 1,850 Units/hr (12/13/17 0500)    PRN Meds: sodium chloride, acetaminophen, docusate, sodium chloride flush   Labs   Results for orders placed or performed during the hospital encounter of 12/07/17 (from the past 48 hour(s))  Glucose, capillary     Status: Abnormal   Collection Time: 12/11/17 11:30 AM  Result Value Ref Range   Glucose-Capillary 132 (H) 70 - 99 mg/dL  Heparin level (unfractionated)     Status: Abnormal   Collection Time: 12/11/17 12:29 PM  Result Value Ref Range   Heparin Unfractionated <0.10 (L) 0.30 - 0.70 IU/mL    Comment: (NOTE) If heparin results are below expected values, and patient dosage has  been confirmed, suggest follow up testing of antithrombin III levels. Performed at [facility], [place] [address] [place], [place] [address]   Protime-INR     Status: Abnormal   Collection Time: 12/11/17 12:29 PM  Result Value Ref Range   Prothrombin Time 21.5 (H) 11.4 - 15.2 seconds   INR 1.89     Comment: Performed at [facility] [address] [place], [place] [address]  Glucose, capillary     Status: None   Collection Time: 12/11/17  4:33 PM  Result Value Ref Range   Glucose-Capillary 87 70 - 99 mg/dL  Glucose, capillary     Status: None   Collection Time: 12/11/17  7:43 PM  Result Value Ref Range   Glucose-Capillary 94 70 - 99 mg/dL  Heparin level (unfractionated)     Status: Abnormal   Collection Time: 12/11/17  10:51 PM  Result Value Ref Range   Heparin Unfractionated 0.29 (L) 0.30 - 0.70 IU/mL    Comment: (NOTE) If heparin results are below expected values, and patient dosage has  been confirmed, suggest follow up testing of antithrombin III levels. Performed at [facility], [place] [address], [place], [place] [address]   Glucose, capillary     Status: Abnormal   Collection Time: 12/11/17 11:53 PM  Result Value Ref Range   Glucose-Capillary 111 (H) 70 - 99 mg/dL  Glucose, capillary     Status: None   Collection Time: 12/12/17  3:52 AM  Result Value Ref Range   Glucose-Capillary 91 70 - 99 mg/dL  Protime-INR     Status: Abnormal   Collection Time: 12/12/17  6:50 AM  Result Value Ref Range   Prothrombin Time 21.2 (H) 11.4 - 15.2 seconds   INR 1.86     Comment: Performed at [facility], [place] [address] [place], [place] [address]  CBC     Status: Abnormal   Collection Time: 12/12/17  6:50 AM  Result Value Ref Range   WBC 5.8 4.0 - 10.5 K/uL   RBC 3.46 (L) 4.22 - 5.81 MIL/uL   Hemoglobin 9.1 (L) 13.0 - 17.0 g/dL   HCT 30.0 (L) 39.0 - 52.0 %   MCV 86.7 78.0 - 100.0 fL   MCH 26.3 26.0 - 34.0 pg   MCHC 30.3 30.0 - 36.0 g/dL   RDW 19.1 (H) 11.5 - 15.5 %   Platelets 141 (L) 150 - 400 K/uL    Comment: Performed at [facility], [place] [address], [place],  [address]  Basic metabolic panel     Status: Abnormal   Collection Time: 12/12/17  6:50 AM  Result Value Ref Range   Sodium 132 (L) 135 - 145 mmol/L   Potassium 3.8 3.5 - 5.1 mmol/L   Chloride 96 (L) 98 - 111 mmol/L    Comment: Please note change in reference range.   CO2 23 22 - 32 mmol/L   Glucose, Bld 89 70 - 99 mg/dL    Comment: Please note change in reference range.   BUN 41 (H) 8 - 23 mg/dL    Comment: Please note change in reference range.   Creatinine, Ser 5.92 (H) 0.61 - 1.24 mg/dL   Calcium 8.6 (L) 8.9 - 10.3 mg/dL   GFR calc non Af Amer 9 (L) >60 mL/min   GFR calc Af Amer 10 (L) >60 mL/min     Comment: (NOTE) The eGFR has been calculated using the CKD EPI equation. This  calculation has not been validated in all clinical situations. eGFR's persistently <60 mL/min signify possible Chronic Kidney Disease.    Anion gap 13 5 - 15    Comment: Performed at [facility] [address], [place], [place] [address]  Heparin level (unfractionated)     Status: Abnormal   Collection Time: 12/12/17  6:50 AM  Result Value Ref Range   Heparin Unfractionated 0.27 (L) 0.30 - 0.70 IU/mL    Comment: (NOTE) If heparin results are below expected values, and patient dosage has  been confirmed, suggest follow up testing of antithrombin III levels. Performed at [facility], [place] [address], [place], [place] [address]   Glucose, capillary     Status: Abnormal   Collection Time: 12/12/17 11:56 AM  Result Value Ref Range   Glucose-Capillary 61 (L) 70 - 99 mg/dL  Glucose, capillary     Status: Abnormal   Collection Time: 12/12/17 12:24 PM  Result Value Ref Range   Glucose-Capillary 102 (H) 70 - 99 mg/dL  Protime-INR     Status: Abnormal   Collection Time: 12/12/17  1:27 PM  Result Value Ref Range   Prothrombin Time 21.1 (H) 11.4 - 15.2 seconds   INR 1.84     Comment: Performed at [facility], [place] [address], [place], [place] [address]  Glucose, capillary     Status: None   Collection Time: 12/12/17  4:24 PM  Result Value Ref Range   Glucose-Capillary 74 70 - 99 mg/dL  Heparin level (unfractionated)     Status: None   Collection Time: 12/12/17  6:11 PM  Result Value Ref Range   Heparin Unfractionated 0.38 0.30 - 0.70 IU/mL    Comment: (NOTE) If heparin results are below expected values, and patient dosage has  been confirmed, suggest follow up testing of antithrombin III levels. Performed at [facility], [place] [address], [place], [place] [address]   Glucose, capillary     Status: Abnormal   Collection Time: 12/12/17  9:00 PM  Result Value Ref Range    Glucose-Capillary 112 (H) 70 - 99 mg/dL  Glucose, capillary     Status: None   Collection Time: 12/13/17 12:45 AM  Result Value Ref Range   Glucose-Capillary 72 70 - 99 mg/dL  Protime-INR     Status: Abnormal   Collection Time: 12/13/17  3:51 AM  Result Value Ref Range   Prothrombin Time 20.5 (H) 11.4 - 15.2 seconds   INR 1.78     Comment: Performed at [facility], [place] [address], [place], [place] [address]  CBC     Status: Abnormal   Collection Time: 12/13/17  3:51 AM  Result Value Ref Range   WBC 6.2 4.0 - 10.5 K/uL   RBC 3.57 (L) 4.22 - 5.81 MIL/uL   Hemoglobin 9.5 (L) 13.0 - 17.0 g/dL   HCT 31.1 (L) 39.0 - 52.0 %   MCV 87.1 78.0 - 100.0 fL   MCH 26.6 26.0 - 34.0 pg   MCHC 30.5 30.0 - 36.0 g/dL   RDW 19.5 (H) 11.5 - 15.5 %   Platelets 157 150 - 400 K/uL    Comment: Performed at [facility], [place] [address] [place], [place] [address]  Heparin level (unfractionated)     Status: None   Collection Time: 12/13/17  3:51 AM  Result Value Ref Range   Heparin Unfractionated 0.46 0.30 - 0.70 IU/mL    Comment: (NOTE) If  heparin results are below expected values, and patient dosage has  been confirmed, suggest follow up testing of antithrombin III levels. Performed at [facility], [place] [address], [place], [place] [address]   Glucose, capillary     Status: None   Collection Time: 12/13/17  4:24 AM  Result Value Ref Range   Glucose-Capillary 76 70 - 99 mg/dL  Glucose, capillary     Status: None   Collection Time: 12/13/17  7:42 AM  Result Value Ref Range   Glucose-Capillary 76 70 - 99 mg/dL    ECG   N/A  Telemetry   Sinus bradycardia - Personally Reviewed  Radiology    No results found.  Cardiac Studies   N/A  Assessment   Active Problems:   Acute on chronic combined systolic and diastolic CHF (congestive heart failure) (HCC)   Acute respiratory failure with hypoxia (HCC)   VT (ventricular tachycardia) (HCC)   Respiratory arrest  ([place])   Plan   1. Plan for [facility] today - on HD. INR improved to 1.78. No further VT. Will give 2.5 mg Vit K today and plan cath later this afternoon.  Time Spent Directly with Patient:  I have spent a total of 15 minutes with the patient reviewing hospital notes, telemetry, EKGs, labs and examining the patient as well as establishing an assessment and plan that was discussed personally with the patient.  > 50% of time was spent in direct patient care.  Length of Stay:  LOS: 6 days   Pixie Casino, MD, [facility], [place] Director of the Advanced Lipid Disorders &  Cardiovascular Risk Reduction Clinic Diplomate of the American Board of Clinical Lipidology Attending Cardiologist  Direct Dial: [phone]  Fax: [phone]  Website:  [email or web address] 12/13/2017, 9:17 AM

## 2017-12-13 NOTE — Consult Note (Addendum)
Cardiology Consultation:   Patient ID: ANTWINE AGOSTO; [id number]; 1950/12/13   Admit date: 12/07/2017 Date of Consult: 12/13/2017  Primary Care Provider: Patient, [name] Primary Cardiologist: Minus Breeding, MD  Primary Electrophysiologist:  new   Patient Profile:   ALASDAIR KLEVE is a 67 y.o. male with a hx of CAD, NICM (depressed EF historically felt out of proportion to his CAD), DM, chronic CHF (mixed), colon cancer with resection and permanent colostomy, AFib, CKD >>> ESRF started HD May this year, HTN, HLD, RBBB, appears as well with functional decline, when last saw Dr. Percival Spanish in April this year was using a wheelchair with decreasing mobility and mentioned noting some confusionwho is being seen today for the evaluation of VT at the request of Dr. .  History of Present Illness:   Mr. Devol was admitted to [facility], resides in SNF, there he reported not feeling well, EMS was called, and found in VT, given adenosine x3, became unresponsive and defibrillated, intubated in ER, started on amiodarone gtt, coumadin held and planned for cath when coags allowed.  Extubated 12/08/17 Cath today noting CAD (report below), unchanged from 2017, no new disease to explain VT, CM felt out of proportion to CAD  LABS K+ 4.6 >>  3.8 BUN/Creat 62/4.80 >> 41/5.92 AST 67 ALT 25 Trop 0.65, 1071, 1.91 WBC 6/1 >> 6.2 H/H 9.4/32.1 >> 9.5/31.1 Plts 157 >> 157  INR today 1.78  The patient only recalls feeling terrible, getting progressively SOB and telling his sister he thought he was dying, that is the last he recalls.  He does nt rcall more until well after being here.  He denied recalling any CP.  Past Medical History:  Diagnosis Date  . Arthritis    "right hand; right knee" (06/19/2014)  . CAD (coronary artery disease)    2v CAD with subtotal occlusion of small co-dominant RCA and borderline lesion in moderate-sized OM-1  . Chronic systolic (congestive) heart failure ([place])    Preserved EF 2016, has  been between 25% and 45% since then  . Coronary artery disease involving native coronary artery of native heart with angina pectoris ([place]) 09/11/2016  . Degenerative joint disease of knee, right   . Dysrhythmia   . Enlarged prostate   . Hyperlipidemia   . Hypertension   . Pneumonia 05/2014  . Prediabetes 10/03/2014  . Scrotal edema 04/03/2015  . SMALL BOWEL OBSTRUCTION, HX OF 08/21/2007   Annotation: with narrowing in the ileocecal region Qualifier: Diagnosis of  By: Hassell Done FNP, Tori Milks      Past Surgical History:  Procedure Laterality Date  . CARDIAC CATHETERIZATION N/A 02/16/2016   Procedure: Right/Left Heart Cath and Coronary Angiography;  Surgeon: Jolaine Artist, MD;  Location: [facility];  Service: Cardiovascular;  Laterality: N/A;  . COLONOSCOPY N/A 04/01/2016   Procedure: COLONOSCOPY;  Surgeon: Leighton Ruff, MD;  Location: [facility];  Service: Endoscopy;  Laterality: N/A;  . INGUINAL HERNIA REPAIR Left 1990's  . IR FLUORO GUIDE CV LINE RIGHT  10/18/2017  . IR GENERIC HISTORICAL  07/05/2016   IR RADIOLOGIST EVAL & MGMT 07/05/2016 Corrie Mckusick, DO [facility] INTERV RAD  . IR RADIOLOGIST EVAL & MGMT  03/29/2017  . IR US GUIDE VASC ACCESS RIGHT  10/18/2017  . XI ROBOT ABDOMINAL PERINEAL RESECTION N/A 09/28/2016   Procedure: XI ROBOT ABDOMINOPERINEAL RESECTION WITH PERMANENT COLOSTOMY ERAS PATHWAY;  Surgeon: Michael Boston, MD;  Location: [facility] OR[facility];  Service: General;  Laterality: N/A;  Inpatient Medications: Scheduled Meds: . [MAR Hold] amiodarone  400 mg Oral BID  . [MAR Hold] Chlorhexidine Gluconate Cloth  6 each Topical Q0600  . [MAR Hold] darbepoetin (ARANESP) injection - DIALYSIS  60 mcg Intravenous Q Thu-HD  . [MAR Hold] famotidine  20 mg Oral Daily  . [MAR Hold] insulin aspart  2-6 Units Subcutaneous Q4H  . [MAR Hold] metoprolol tartrate  12.5 mg Oral BID  . sodium chloride flush  3 mL Intravenous Q12H  . [MAR Hold] traZODone  50 mg Oral QHS   Continuous  Infusions: . sodium chloride    . sodium chloride 10 mL/hr at 12/12/17 0658  . sodium chloride 10 mL/hr at 12/13/17 0658  . dextrose 30 mL/hr at 12/13/17 0500  . heparin 1,850 Units/hr (12/13/17 1237)   PRN Meds: sodium chloride, [MAR Hold] acetaminophen, [MAR Hold] docusate, sodium chloride flush  Allergies:    Allergies  Allergen Reactions  . Nsaids     Kidney disease    Social History:   Social History   Socioeconomic History  . Marital status: Single    Spouse name: Not on file  . Number of children: Not on file  . Years of education: Not on file  . Highest education level: Not on file  Occupational History  . Occupation: Retired  Scientific laboratory technician  . Financial resource strain: Not on file  . Food insecurity:    Worry: Not on file    Inability: Not on file  . Transportation needs:    Medical: Not on file    Non-medical: Not on file  Tobacco Use  . Smoking status: Former Smoker    Years: 5.00    Types: Pipe, Landscape architect  . Smokeless tobacco: Never Used  . Tobacco comment: 06/19/2014 "stopped smoking in ~ 2014; used to smoke a pipe or cigar a couple times/month"  Substance and Sexual Activity  . Alcohol use: No  . Drug use: No  . Sexual activity: Never  Lifestyle  . Physical activity:    Days per week: Not on file    Minutes per session: Not on file  . Stress: Not on file  Relationships  . Social connections:    Talks on phone: Not on file    Gets together: Not on file    Attends religious service: Not on file    Active member of club or organization: Not on file    Attends meetings of clubs or organizations: Not on file    Relationship status: Not on file  . Intimate partner violence:    Fear of current or ex partner: Not on file    Emotionally abused: Not on file    Physically abused: Not on file    Forced sexual activity: Not on file  Other Topics Concern  . Not on file  Social History Narrative   Single, lives in a facility. His sister lives locally, but  is homebound.     Family History:   Family History  Problem Relation Age of Onset  . Hypertension Mother   . Diabetes Mother   . Stroke Mother   . Cancer Father 9       Prostate  . Dementia Father   . COPD Sister   . Arthritis Sister   . Edema Sister      ROS:  Please see the history of present illness.  All other ROS reviewed and negative.     Physical Exam/Data:   Vitals:   12/13/17 1355 12/13/17 1400  12/13/17 1405 12/13/17 1409  BP: 106/74 102/72 104/71 103/72  Pulse: (!) 57 63 (!) 52 64  Resp: 17 14 16  (!) 21  Temp:      TempSrc:      SpO2: 98% 99% 95% 97%  Weight:      Height:        Intake/Output Summary (Last 24 hours) at 12/13/2017 1449 Last data filed at 12/13/2017 0500 Gross per 24 hour  Intake 1199.09 ml  Output 45 ml  Net 1154.09 ml   Filed Weights   12/12/17 0740 12/12/17 1144 12/13/17 0407  Weight: 182 lb 8.7 oz (82.8 kg) 176 lb 5.9 oz (80 kg) 176 lb 12.8 oz (80.2 kg)   Body mass index is 27.69 kg/m.  General:  Well nourished, well developed, in no acute distress, appears much older then stated age, chronically ill appearing HEENT: normal Lymph: no adenopathy Neck: no JVD Endocrine:  No thryomegaly Vascular: No carotid bruits Cardiac:  RRR; no murmurs, gallops or rub Lungs:  CTA b/l, no wheezing, rhonchi or rales  Abd: soft, nontender Ext: no edema Musculoskeletal:  No deformities, advanced atrophy Skin: warm and dry  Neuro:   He is oriented, no gross focal motor abnormalities noted Psych:  Normal affect   EKG:  The EKG was personally reviewed and demonstrates:   12/07/17 regular rhythm, RBBB, LAD, 99bpm 12/11/17 SR, 1st degree AVblock, possibly Mobitz one, 57bpm, RBBB, PR 349ms - 464ms Telemetry:  Telemetry was personally reviewed and demonstrates:   SR/SB, 1st degree AVBlock, episodes of Mobitz-I, rates 50's-70's  Relevant CV Studies:  12/13/17: LHC  Prox RCA to Mid RCA lesion is 100% stenosed.  Ost LPDA to LPDA lesion is 90%  stenosed.  Ost 1st Mrg to 1st Mrg lesion is 70% stenosed.  Ost 1st Diag to 1st Diag lesion is 70% stenosed.  LV end diastolic pressure is normal.   1. Obstructive CAD. The patient has a left dominant circulation.    - 70% first diagonal    - 70% first OM    - 90% left PDA. A sub branch of the PDA is occluded. The vessel is diffusely diseased.    - 100% nondominant RCA with collaterals. CTO 2. Normal LVEDP  Plan: There is no change in coronary anatomy compared to September 2017. His cardiomyopathy is out of proportion to his CAD. No acute lesion to explain his VT. Recommend EP consult. Will resume IV heparin 8 hours post sheath removal. Plan to resume coumadin once cleared by EP.   2D echocardiogram (12/07/2017) Study Conclusions - Left ventricle: The cavity size was normal. There was mild focal basal hypertrophy of the septum. Systolic function was severely reduced. The estimated ejection fraction was in the range of 15% to 20%. Severe diffuse hypokinesis worse in the inferoseptal, inferior and inferolateral myocardium. Features are consistent with a pseudonormal left ventricular filling pattern, with concomitant abnormal relaxation and increased filling pressure (grade 2 diastolic dysfunction). Doppler parameters are consistent with high ventricular filling pressure. - Aortic valve: Transvalvular velocity was within the normal range. There was no stenosis. There was no regurgitation. Valve area (VTI): 3.75 cm^2. Valve area (Vmax): 3.72 cm^2. Valve area (Vmean): 3.59 cm^2. - Mitral valve: Transvalvular velocity was within the normal range. There was no evidence for stenosis. There was mild regurgitation. - Left atrium: The atrium was severely dilated. - Right ventricle: The cavity size was normal. Wall thickness was normal. Systolic function was moderately reduced. - Right atrium: The atrium was severely dilated. -  Tricuspid valve: There was moderate  regurgitation. - Pulmonary arteries: Systolic pressure was moderately increased. PA peak pressure: 56 mm Hg (S). - Pericardium, extracardiac: There was a large left pleural effusion. Impressions: - Compared with the echo 12/3660, systolic function is significantly worse.   ECHO: 10/07/2017 - Left ventricle: Diffuse hypokineis worse in inferior wall with abnromal septal motion Wall thickness was increased in a pattern of mild LVH. Systolic function was moderately reduced. The estimated ejection fraction was in the range of 35% to 40%. The study is not technically sufficient to allow evaluation of LV diastolic function. - Mitral valve: There was mild regurgitation. - Left atrium: The atrium was severely dilated. - Right ventricle: The cavity size was moderately dilated. - Right atrium: The atrium was moderately dilated. - Atrial septum: No defect or patent foramen ovale was identified. - Tricuspid valve: There was moderate regurgitation.  CATH: 02/16/2016 Findings: RA = 10 RV = 69/13 PA = 62/23 (36) PCW = 23 Fick cardiac output/index = 4.9/2.4 PVR = 2.6 WU SVR = 1363 FA sat = 97% PA sat = 64%, 64% Assessment: 1. 2v CAD with subtotal occlusion of small co-dominant RCA and borderline lesion in moderate-sized OM-1 2. 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 Normal cardiac output Plan/Discussion: Continue aggressive medical therapy. Consider switching losartan to Entresto and sleep study.    Laboratory Data:  Chemistry Recent Labs  Lab 12/10/17 0620 12/11/17 0442 12/12/17 0650  NA 134* 134* 132*  K 3.6 3.9 3.8  CL 99 97* 96*  CO2 24 23 23   GLUCOSE 82 89 89  BUN 22 32* 41*  CREATININE 3.77* 4.91* 5.92*  CALCIUM 8.6* 8.7* 8.6*  GFRNONAA 15* 11* 9*  GFRAA 17* 13* 10*  ANIONGAP 11 14 13     Recent Labs  Lab 12/07/17 0206  PROT 6.4*  ALBUMIN 2.3*  AST 67*  ALT  25  ALKPHOS 166*  BILITOT 1.2   Hematology Recent Labs  Lab 12/11/17 0442 12/12/17 0650 12/13/17 0351  WBC 7.1 5.8 6.2  RBC 3.67* 3.46* 3.57*  HGB 9.6* 9.1* 9.5*  HCT 31.9* 30.0* 31.1*  MCV 86.9 86.7 87.1  MCH 26.2 26.3 26.6  MCHC 30.1 30.3 30.5  RDW 19.1* 19.1* 19.5*  PLT 172 141* 157   Cardiac Enzymes Recent Labs  Lab 12/07/17 0646 12/07/17 1412 12/07/17 2123  TROPONINI 0.65* 1.71* 1.91*    Recent Labs  Lab 12/07/17 0223  TROPIPOC 0.10*    BNPNo results for input(s): BNP, PROBNP in the last 168 hours.  DDimer No results for input(s): DDIMER in the last 168 hours.  Radiology/Studies:  No results found.  Assessment and Plan:   1. VT arrest     Long standing NICM, RBBB     Is a candidate for ICD implant, given RBBB not certain of CRT benefit  2. Baseline conduction system disease     Trifascicular block here w/Mobitz-I as well given rise to transient bradycardia     Stop amio and BB  3. Hx of AFib described as chronic     CHA2DS2Vasc is 5, on warfarin outpatient held here for cath  >> heparin gtt     Some of his old EKGs possibly SR long 1st degree  On Metoprolol 12.5mg  BID and amiodarone 400mg  BID   4. CAD     LHC today described as unchanged from prior with known CAD     Not felt to have  any new disease to explain VT         For questions or updates, please contact [place] Please consult [email or web address] for contact info under Cardiology/STEMI.   Signed, Baldwin Jamaica, PA-C  12/13/2017 2:49 PM   VT  Trifasicular block  RBBB LAFB and 1 AVB >400 msec  ERSD  Colon cancer s/p resection  Debility  NICM    Pt presented with syncopal VT  DCCV  Cath >> CAD unchanged and LV dysfunction  Multiple comorbities.  Usual strategy would be CRT-D with Conduction disease and VT  Risk of infection and comorbity makes me wary ,and discussed with DR [facility] who leaned towards medical therapy.    Another option would be SICD but that would leave  conduction system unaddressed  We also discussed med Rx  Normally that would include amiodarone, buthis conduction system disease precludes ( and BB)  Based on RAID, suggested ranolazine and adjunctive mexilitene  Both hepatically metabolized    Reviewed with patient and Dr [facility]

## 2017-12-13 NOTE — Progress Notes (Signed)
Responded to Cath Lab page per patient  to provide emotional and spiritual support and prayer before his surgery.  Prayed with patient and patient appeared to be calmer and ready for procedure.  I told patient when he is done with procedure and back to room have chaplain page and we will follow up with him to continue support.  Jaclynn Major, [place], [facility], Pager [phone]

## 2017-12-13 NOTE — Progress Notes (Signed)
Patient Demographics:    Patrick [name], is a 67 y.o. male, DOB - 27-Feb-1951, ZOX:[id number]  Admit date - 12/07/2017   Admitting Physician Reyne Dumas, MD  Outpatient Primary MD for the patient is Patient, [name]  LOS - 6   Chief Complaint  Patient presents with  . Altered Mental Status        Subjective:    Patrick [name] today has no fevers, no emesis,  No chest pain, received vitamin K in order to allow for left heart cath given elevated INR  Assessment  & Plan :    Active Problems:   Acute on chronic combined systolic and diastolic CHF (congestive heart failure) (HCC)   Acute respiratory failure with hypoxia (HCC)   VT (ventricular tachycardia) ([place])   Respiratory arrest [facility])  Brief Summary 67 y.o.malewith a hx of subtotal occlusion of the small codominantRCA and 70% OMtreated medically, S-D-CHF, HTN, HLD, borderline DM, SBO 2009, BPH, gout, OA. 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 tach subsequently intubated on 2019 the ED extubated on 12/08/2017, transferred to hospitalist service on 12/10/2017.   Plan:- 1)S/p VTach Arrest--stable at this time, cardiology input appreciated, for left heart catheterization on 12/13/2017, continue aspirin and amiodarone as well as metoprolol  2)ESRD--last hemodialysis session on 12/12/2017 nephrology input appreciated  3)Coronary Artery Disease-patient has subtotally occluded codominant RCA,, cardiology input appreciated, left heart cath on 12/13/2017 pending, IV heparin bridge, aspirin and metoprolol as ordered  4)Persistent atrial fibrillation--stable at this time, Coumadin on hold to allow for left heart catheterization, use IV heparin bridge, amiodarone  as ordered, continue metoprolol 12.5 mg twice daily for rate control  5)HFrEF/ ischemic cardiomyopathy/systolic dysfunction CHF--EF 15 to 20%, neurology input appreciated,  6)DM2-stable, last known A1c 5.7, Use Novolog/Humalog Sliding scale insulin with Accu-Cheks/Fingersticks as ordered   7)Anemia of CKD--- ASA/EPO agent as per nephrology service, hemoglobin is stable above 9 at this time,  Code Status : full   Disposition Plan  : TBD  Consults  : Nephrology/cardiology   DVT Prophylaxis  : IV heparin drip subsequently will be back on Coumadin Lab Results  Component Value Date   PLT 157 12/13/2017    Inpatient Medications  Scheduled Meds: . [MAR Hold] amiodarone  400 mg Oral BID  . [MAR Hold] Chlorhexidine Gluconate Cloth  6 each Topical Q0600  . [MAR Hold] darbepoetin (ARANESP) injection - DIALYSIS  60 mcg Intravenous Q Thu-HD  . [MAR Hold] famotidine  20 mg Oral Daily  . [MAR Hold] insulin aspart  2-6 Units Subcutaneous Q4H  . [MAR Hold] metoprolol tartrate  12.5 mg Oral BID  . sodium chloride flush  3 mL Intravenous Q12H  . [MAR Hold] traZODone  50 mg Oral QHS   Continuous Infusions: . sodium chloride    . sodium chloride 10 mL/hr at 12/12/17 0658  . sodium chloride 10 mL/hr at 12/13/17 0658  . dextrose 30 mL/hr at 12/13/17 0500  . heparin 1,850 Units/hr (12/13/17 1237)   PRN Meds:.sodium chloride, [MAR Hold] acetaminophen, [MAR Hold] docusate, fentaNYL, Heparin (Porcine) in NaCl,  iohexol, lidocaine (PF), midazolam, sodium chloride flush    Anti-infectives (From admission, onward)   None        Objective:   Vitals:   12/13/17 1355 12/13/17 1400 12/13/17 1405 12/13/17 1409  BP: 106/74 102/72 104/71 103/72  Pulse: (!) 57 63 (!) 52 64  Resp: 17 14 16  (!) 21  Temp:      TempSrc:      SpO2: 98% 99% 95% 97%  Weight:      Height:        Wt Readings from Last 3 Encounters:  12/13/17 80.2 kg (176 lb 12.8 oz)  10/27/17 82.6 kg (182 lb 1.6 oz)  08/29/17 97.5  kg (215 lb)     Intake/Output Summary (Last 24 hours) at 12/13/2017 1415 Last data filed at 12/13/2017 0500 Gross per 24 hour  Intake 1199.09 ml  Output 45 ml  Net 1154.09 ml     Physical Exam  Gen:- Awake Alert,  In no apparent distress  HEENT:- [place].AT, No sclera icterus Neck-Supple Neck,No JVD,.  Right IJ hemodialysis catheter site is clean dry and intact Lungs-  CTAB , good air movement CV- S1, S2 normal Abd-  +ve B.Sounds, Abd Soft, No tenderness, colostomy bag with fecal contents Extremity/Skin:- No  edema,   good pulses Psych-affect is appropriate, oriented x3 Neuro-no new focal deficits, no tremors   Data Review:   Micro Results Recent Results (from the past 240 hour(s))  Culture, blood (routine x 2)     Status: None   Collection Time: 12/07/17  4:00 AM  Result Value Ref Range Status   Specimen Description BLOOD RIGHT HAND  Final   Special Requests   Final    BOTTLES DRAWN AEROBIC AND ANAEROBIC Blood Culture adequate volume   Culture   Final    NO GROWTH 5 DAYS Performed at [facility], [place] [address] [place], [place] [address]    Report Status 12/12/2017 FINAL  Final  Culture, blood (routine x 2)     Status: None   Collection Time: 12/07/17  4:18 AM  Result Value Ref Range Status   Specimen Description BLOOD LEFT HAND  Final   Special Requests   Final    BOTTLES DRAWN AEROBIC AND ANAEROBIC Blood Culture adequate volume   Culture   Final    NO GROWTH 5 DAYS Performed at [facility], [place] [address], [place], [place] [address]    Report Status 12/12/2017 FINAL  Final  MRSA PCR Screening     Status: Abnormal   Collection Time: 12/07/17  6:04 AM  Result Value Ref Range Status   MRSA by PCR POSITIVE (A) NEGATIVE Final    Comment:        The GeneXpert MRSA Assay (FDA approved for NASAL specimens only), is one component of a comprehensive MRSA colonization surveillance program. It is not intended to diagnose MRSA infection nor to guide  or monitor treatment for MRSA infections. CRITICAL RESULT CALLED TO, READ BACK BY AND VERIFIED WITH: RN C RICE 12/07/17 AT 815 BY CM Performed at [facility], [place] [address], [place], [place] [address]     Radiology Reports Ct Head Wo Contrast  Result Date: 12/07/2017 CLINICAL DATA:  Found down at side of bed, cardioverted. History of hypertension, hyperlipidemia. EXAM: CT HEAD WITHOUT CONTRAST CT CERVICAL SPINE WITHOUT CONTRAST TECHNIQUE: Multidetector CT imaging of the head and cervical spine was performed following the standard protocol without intravenous contrast. Multiplanar CT image reconstructions  of the cervical spine were also generated. COMPARISON:  CT HEAD June 01, 2014 FINDINGS: CT HEAD FINDINGS BRAIN: No intraparenchymal hemorrhage, mass effect nor midline shift. Moderate parenchymal brain volume loss. No hydrocephalus. Patchy supratentorial white matter hypodensities. No acute large vascular territory infarcts. No abnormal extra-axial fluid collections. Basal cisterns are patent. VASCULAR: Moderate calcific atherosclerosis of the carotid siphons. SKULL: No skull fracture. No significant scalp soft tissue swelling. Focal calcified parietal convexity tricholemmal cyst. SINUSES/ORBITS: Mild-to-moderate paranasal sinus mucosal thickening. Mastoid air cells are well aerated.The included ocular globes and orbital contents are non-suspicious. OTHER: Life-support lines in place. CT CERVICAL SPINE FINDINGS ALIGNMENT: Straightened lordosis.  Vertebral bodies in alignment. SKULL BASE AND VERTEBRAE: Cervical vertebral bodies and posterior elements are intact. Moderate to severe C3-4 through C6-7 disc height loss with endplate spurring compatible with degenerative discs, moderate C2-3. C1-2 articulation maintained with moderate arthropathy. No destructive bony lesions. SOFT TISSUES AND SPINAL CANAL: Nonacute. Mild calcific atherosclerosis carotid bifurcations. DISC LEVELS: Severe LEFT C3-4,  RIGHT C4-5 neural foraminal narrowing. Mild canal stenosis C6-7. UPPER CHEST: Lung apices are clear. OTHER: Life-support lines in place. IMPRESSION: CT HEAD: 1. No acute intracranial process. 2. Moderate parenchymal brain volume loss. 3. Moderate chronic small vessel ischemic changes. CT CERVICAL SPINE: 1. No fracture or malalignment. Electronically Signed   By: Elon Alas M.D.   On: 12/07/2017 03:20   Ct Cervical Spine Wo Contrast  Result Date: 12/07/2017 CLINICAL DATA:  Found down at side of bed, cardioverted. History of hypertension, hyperlipidemia. EXAM: CT HEAD WITHOUT CONTRAST CT CERVICAL SPINE WITHOUT CONTRAST TECHNIQUE: Multidetector CT imaging of the head and cervical spine was performed following the standard protocol without intravenous contrast. Multiplanar CT image reconstructions of the cervical spine were also generated. COMPARISON:  CT HEAD June 01, 2014 FINDINGS: CT HEAD FINDINGS BRAIN: No intraparenchymal hemorrhage, mass effect nor midline shift. Moderate parenchymal brain volume loss. No hydrocephalus. Patchy supratentorial white matter hypodensities. No acute large vascular territory infarcts. No abnormal extra-axial fluid collections. Basal cisterns are patent. VASCULAR: Moderate calcific atherosclerosis of the carotid siphons. SKULL: No skull fracture. No significant scalp soft tissue swelling. Focal calcified parietal convexity tricholemmal cyst. SINUSES/ORBITS: Mild-to-moderate paranasal sinus mucosal thickening. Mastoid air cells are well aerated.The included ocular globes and orbital contents are non-suspicious. OTHER: Life-support lines in place. CT CERVICAL SPINE FINDINGS ALIGNMENT: Straightened lordosis.  Vertebral bodies in alignment. SKULL BASE AND VERTEBRAE: Cervical vertebral bodies and posterior elements are intact. Moderate to severe C3-4 through C6-7 disc height loss with endplate spurring compatible with degenerative discs, moderate C2-3. C1-2 articulation  maintained with moderate arthropathy. No destructive bony lesions. SOFT TISSUES AND SPINAL CANAL: Nonacute. Mild calcific atherosclerosis carotid bifurcations. DISC LEVELS: Severe LEFT C3-4, RIGHT C4-5 neural foraminal narrowing. Mild canal stenosis C6-7. UPPER CHEST: Lung apices are clear. OTHER: Life-support lines in place. IMPRESSION: CT HEAD: 1. No acute intracranial process. 2. Moderate parenchymal brain volume loss. 3. Moderate chronic small vessel ischemic changes. CT CERVICAL SPINE: 1. No fracture or malalignment. Electronically Signed   By: Elon Alas M.D.   On: 12/07/2017 03:20   Dg Chest Port 1 View  Result Date: 12/08/2017 CLINICAL DATA:  Respiratory failure, endotracheal tube present. EXAM: PORTABLE CHEST 1 VIEW COMPARISON:  Radiograph December 07, 2017. FINDINGS: Stable cardiomegaly. Endotracheal and nasogastric tubes are unchanged in position. Right internal jugular dialysis catheter is unchanged. Stable mild right basilar subsegmental atelectasis is noted. Stable left basilar opacity is noted concerning for atelectasis with associated pleural effusion. No pneumothorax is  noted. Bony thorax is unremarkable. IMPRESSION: Stable support apparatus. Stable bibasilar opacities, left greater than right, as described above. Electronically Signed   By: Marijo Conception, M.D.   On: 12/08/2017 08:51   Dg Chest Port 1 View  Result Date: 12/07/2017 CLINICAL DATA:  Intubation. EXAM: PORTABLE CHEST 1 VIEW COMPARISON:  Chest radiograph Oct 23, 2017 FINDINGS: Endotracheal tube tip projects 16 mm above the carina. Nasogastric tube looped in proximal stomach, distal tip projecting at GE junction. Multiple EKG lines overlie the patient and may obscure subtle underlying pathology. Tunneled dialysis catheter via RIGHT internal jugular venous approach with distal tip projecting in RIGHT atrium. Cardiomegaly and pulmonary vascular congestion. Low inspiratory examination with crowded vascular markings. Potential  LEFT pleural effusion. RIGHT costophrenic angle out of field of view. No pneumothorax. Soft tissue planes and included osseous structures are non suspicious. IMPRESSION: Endotracheal tube tip projects 16 mm above the carina. Nasogastric tube looped in proximal stomach, distal tip projecting at GE junction. Cardiomegaly and pulmonary vascular congestion. Electronically Signed   By: Elon Alas M.D.   On: 12/07/2017 02:24     CBC Recent Labs  Lab 12/07/17 0206  12/08/17 0154 12/09/17 0307 12/10/17 0620 12/11/17 0442 12/12/17 0650 12/13/17 0351  WBC 6.1   < > 4.9 7.2 6.9 7.1 5.8 6.2  HGB 9.4*   < > 8.8* 9.1* 9.2* 9.6* 9.1* 9.5*  HCT 32.1*   < > 29.1* 31.6* 30.8* 31.9* 30.0* 31.1*  PLT 157   < > 136* 133* 126* 172 141* 157  MCV 89.9   < > 87.9 91.9 87.5 86.9 86.7 87.1  MCH 26.3   < > 26.6 26.5 26.1 26.2 26.3 26.6  MCHC 29.3*   < > 30.2 28.8* 29.9* 30.1 30.3 30.5  RDW 19.4*   < > 19.3* 19.6* 19.3* 19.1* 19.1* 19.5*  LYMPHSABS 0.6*  --  0.6* 0.9 0.7  --   --   --   MONOABS 0.7  --  0.8 1.1* 1.0  --   --   --   EOSABS 0.0  --  0.0 0.0 0.1  --   --   --   BASOSABS 0.0  --  0.0 0.1 0.0  --   --   --    < > = values in this interval not displayed.    Chemistries  Recent Labs  Lab 12/07/17 0206  12/07/17 0646 12/08/17 0154 12/09/17 0307 12/09/17 0509 12/10/17 0620 12/11/17 0442 12/12/17 0650  NA 139   < > 140 139 134*  --  134* 134* 132*  K 4.6   < > 3.6 3.3* 4.4  --  3.6 3.9 3.8  CL 104   < > 101 103 100  --  99 97* 96*  CO2 16*  --  20* 25 20*  --  24 23 23   GLUCOSE 156*   < > 92 77 84  --  82 89 89  BUN 53*   < > 54* 30* 40*  --  22 32* 41*  CREATININE 5.00*   < > 5.37* 3.75* 5.00*  --  3.77* 4.91* 5.92*  CALCIUM 8.1*  --  8.3* 8.7* 8.7*  --  8.6* 8.7* 8.6*  MG 1.8  --  1.9  --   --  2.1  --   --   --   AST 67*  --   --   --   --   --   --   --   --  ALT 25  --   --   --   --   --   --   --   --   ALKPHOS 166*  --   --   --   --   --   --   --   --   BILITOT 1.2  --    --   --   --   --   --   --   --    < > = values in this interval not displayed.   ------------------------------------------------------------------------------------------------------------------ No results for input(s): CHOL, HDL, LDLCALC, TRIG, CHOLHDL, LDLDIRECT in the last 72 hours.  Lab Results  Component Value Date   HGBA1C 5.7 (H) 09/23/2016   ------------------------------------------------------------------------------------------------------------------ No results for input(s): TSH, T4TOTAL, T3FREE, THYROIDAB in the last 72 hours.  Invalid input(s): FREET3 ------------------------------------------------------------------------------------------------------------------ No results for input(s): VITAMINB12, FOLATE, FERRITIN, TIBC, IRON, RETICCTPCT in the last 72 hours.  Coagulation profile Recent Labs  Lab 12/11/17 0442 12/11/17 1229 12/12/17 0650 12/12/17 1327 12/13/17 0351  INR 1.82 1.89 1.86 1.84 1.78    No results for input(s): DDIMER in the last 72 hours.  Cardiac Enzymes Recent Labs  Lab 12/07/17 0646 12/07/17 1412 12/07/17 2123  TROPONINI 0.65* 1.71* 1.91*   ------------------------------------------------------------------------------------------------------------------    Component Value Date/Time   BNP 487.5 (H) 05/23/2017 1332     Josip Merolla M.D on 12/13/2017 at 2:15 PM   Go to [email or web address] - password [email or web address] for contact info  [facility] - Office  [phone]

## 2017-12-13 NOTE — Progress Notes (Signed)
[facility] Progress Note   Subjective:   Laying in bed.  States he is doing ok.  No new complaints.  Objective Vitals:   12/12/17 2100 12/13/17 0036 12/13/17 0407 12/13/17 0744  BP:  (!) 112/97 115/77 118/79  Pulse: 68 69 63   Resp:  16 (!) 21   Temp:  98.2 F (36.8 C) 97.7 F (36.5 C) (!) 97.5 F (36.4 C)  TempSrc:  Oral Oral Oral  SpO2:  100% 100% 100%  Weight:   80.2 kg (176 lb 12.8 oz)   Height:       Physical Exam General:pale, chronically ill appearing male Heart:RRR, +3/8 systolic murmur Lungs:mostly CTAB, +wheezes on L Extremities:1+ edema on L, trace edema on R Dialysis Access: R IJ [facility]   Filed Weights   12/12/17 0740 12/12/17 1144 12/13/17 0407  Weight: 82.8 kg (182 lb 8.7 oz) 80 kg (176 lb 5.9 oz) 80.2 kg (176 lb 12.8 oz)    Intake/Output Summary (Last 24 hours) at 12/13/2017 0944 Last data filed at 12/13/2017 0500 Gross per 24 hour  Intake 1199.09 ml  Output 2919 ml  Net -1719.91 ml    Additional Objective Labs: Basic Metabolic Panel: Recent Labs  Lab 12/07/17 0646  12/10/17 0620 12/11/17 0442 12/12/17 0650  NA 140   < > 134* 134* 132*  K 3.6   < > 3.6 3.9 3.8  CL 101   < > 99 97* 96*  CO2 20*   < > _0 GLUCOSE 92   < > 82 89 89  BUN 54*   < > 22 32* 41*  CREATININE 5.37*   < > 3.77* 4.91* 5.92*  CALCIUM 8.3*   < > 8.6* 8.7* 8.6*  PHOS 5.2*  --   --   --   --    < > = values in this interval not displayed.   Liver Function Tests: Recent Labs  Lab 12/07/17 0206  AST 67*  ALT 25  ALKPHOS 166*  BILITOT 1.2  PROT 6.4*  ALBUMIN 2.3*   CBC: Recent Labs  Lab 12/08/17 0154 12/09/17 0307 12/10/17 0620 12/11/17 0442 12/12/17 0650 12/13/17 0351  WBC 4.9 7.2 6.9 7.1 5.8 6.2  NEUTROABS 3.4 5.1 5.1  --   --   --   HGB 8.8* 9.1* 9.2* 9.6* 9.1* 9.5*  HCT 29.1* 31.6* 30.8* 31.9* 30.0* 31.1*  MCV 87.9 91.9 87.5 86.9 86.7 87.1  PLT 136* 133* 126* 172 141* 157   Cardiac Enzymes: Recent Labs  Lab 12/07/17 0646  12/07/17 1412 12/07/17 2123  TROPONINI 0.65* 1.71* 1.91*   CBG: Recent Labs  Lab 12/12/17 1624 12/12/17 2100 12/13/17 0045 12/13/17 0424 12/13/17 0742  GLUCAP 74 112* 72 76 76   Lab Results  Component Value Date   INR 1.78 12/13/2017   INR 1.84 12/12/2017   INR 1.86 12/12/2017   Studies/Results: No results found.  Medications: . sodium chloride    . sodium chloride 10 mL/hr at 12/12/17 0658  . sodium chloride 10 mL/hr at 12/13/17 0658  . dextrose 30 mL/hr at 12/13/17 0500  . heparin 1,850 Units/hr (12/13/17 0500)   . amiodarone  400 mg Oral BID  . Chlorhexidine Gluconate Cloth  6 each Topical Q0600  . darbepoetin (ARANESP) injection - DIALYSIS  60 mcg Intravenous Q Thu-HD  . famotidine  20 mg Oral Daily  . insulin aspart  2-6 Units Subcutaneous Q4H  . metoprolol tartrate  12.5 mg Oral BID  .  phytonadione  2.5 mg Oral NOW  . sodium chloride flush  3 mL Intravenous Q12H  . traZODone  50 mg Oral QHS    Dialysis Orders: [facility] (recent start) 4h 400/800 79kg 3K/ 2Ca bath Hep 2700 R IJ TDC - mircera 75 every 2 wks last 6/27  Home meds: - coumadin 39m hs/ lipitor 10 mg hs - wellbutrin 1062mqd/ baclofen 5 mg tid prn/ percocet prn/ desyrel 50 hs - protonix 40 qd/ sl ntg prn/ mvi/ allopurinol 300 qd  Assessment/Plan: 1. VTach / witnessed arrest - Cardio following - on amiodarone. LHC to be completed today per cards. 2. A. Fib - on coumadin at home. Held for heart cath. Per cardio 3. CHF- EF reduced from 30% > 15-20%. Per cardio 4. ESRD - HD [facility] - recent start. Last K 3.8, using 4K bath. HD yesterday tolerated well, net UF removed 2.9L.  Orders written for HD tomorrow per regular schedule with UF goal to edw. 5. Anemia of CKD- Hgb 9.5. Continue aranesp 6090mqwk (thurs) while admitted.  6. Secondary hyperparathyroidism - Ca 8.6, last phos 5.2. Not on VDRA or binders.  7. HTN/volume - BP well controlled. EDW not met post HD. LE edema improved but still  present on exam, new to HD still establishing EDW, continue to titrate down as tolerated, should be able to meet. 8. Nutrition - Renal diet with fluid restrictions. Renavite. 9. Hx colon cancer 10. Hx depression 11. Hx gout   LinJen MowA-C [facility] Pager: [phone]/2019,9:44 AM  LOS: 6 days

## 2017-12-14 ENCOUNTER — Other Ambulatory Visit: Payer: Self-pay

## 2017-12-14 ENCOUNTER — Encounter (HOSPITAL_COMMUNITY): Payer: Self-pay | Admitting: Cardiology

## 2017-12-14 LAB — BASIC METABOLIC PANEL
Anion gap: 13 (ref 5–15)
BUN: 32 mg/dL — ABNORMAL HIGH (ref 8–23)
CO2: 24 mmol/L (ref 22–32)
Calcium: 8.6 mg/dL — ABNORMAL LOW (ref 8.9–10.3)
Chloride: 96 mmol/L — ABNORMAL LOW (ref 98–111)
Creatinine, Ser: 5.34 mg/dL — ABNORMAL HIGH (ref 0.61–1.24)
GFR calc non Af Amer: 10 mL/min — ABNORMAL LOW (ref >60)
GFR, EST AFRICAN AMERICAN: 11 mL/min — AB (ref >60)
Glucose, Bld: 94 mg/dL (ref 70–99)
Potassium: 4.4 mmol/L (ref 3.5–5.1)
Sodium: 133 mmol/L — ABNORMAL LOW (ref 135–145)

## 2017-12-14 LAB — GLUCOSE, CAPILLARY
GLUCOSE-CAPILLARY: 58 mg/dL — AB (ref 70–99)
GLUCOSE-CAPILLARY: 79 mg/dL (ref 70–99)
GLUCOSE-CAPILLARY: 84 mg/dL (ref 70–99)
GLUCOSE-CAPILLARY: 90 mg/dL (ref 70–99)
Glucose-Capillary: 62 mg/dL — ABNORMAL LOW (ref 70–99)
Glucose-Capillary: 65 mg/dL — ABNORMAL LOW (ref 70–99)
Glucose-Capillary: 66 mg/dL — ABNORMAL LOW (ref 70–99)
Glucose-Capillary: 70 mg/dL (ref 70–99)
Glucose-Capillary: 72 mg/dL (ref 70–99)
Glucose-Capillary: 86 mg/dL (ref 70–99)
Glucose-Capillary: 99 mg/dL (ref 70–99)

## 2017-12-14 LAB — CBC
HCT: 30.8 % — ABNORMAL LOW (ref 39.0–52.0)
Hemoglobin: 9.4 g/dL — ABNORMAL LOW (ref 13.0–17.0)
MCH: 26.6 pg (ref 26.0–34.0)
MCHC: 30.5 g/dL (ref 30.0–36.0)
MCV: 87.3 fL (ref 78.0–100.0)
PLATELETS: 145 10*3/uL — AB (ref 150–400)
RBC: 3.53 MIL/uL — ABNORMAL LOW (ref 4.22–5.81)
RDW: 20 % — AB (ref 11.5–15.5)
WBC: 6.1 10*3/uL (ref 4.0–10.5)

## 2017-12-14 LAB — PROTIME-INR
INR: 1.53
Prothrombin Time: 18.2 seconds — ABNORMAL HIGH (ref 11.4–15.2)

## 2017-12-14 LAB — MAGNESIUM: Magnesium: 1.6 mg/dL — ABNORMAL LOW (ref 1.7–2.4)

## 2017-12-14 LAB — HEPARIN LEVEL (UNFRACTIONATED): Heparin Unfractionated: 0.35 IU/mL (ref 0.30–0.70)

## 2017-12-14 MED ORDER — ATORVASTATIN CALCIUM 10 MG PO TABS: 10.0000 mg | ORAL_TABLET | Freq: Every day | ORAL | Status: DC

## 2017-12-14 MED ORDER — WARFARIN - PHARMACIST DOSING INPATIENT
Freq: Every day | Status: DC
  Administered 2017-12-15: 17:00:00

## 2017-12-14 MED ORDER — DARBEPOETIN ALFA 60 MCG/0.3ML IJ SOSY
PREFILLED_SYRINGE | INTRAMUSCULAR | Status: AC
  Administered 2017-12-14: 60 ug via INTRAVENOUS
  Filled 2017-12-14: qty 0.3

## 2017-12-14 MED ORDER — HEPARIN SODIUM (PORCINE) 1000 UNIT/ML DIALYSIS
1000.0000 [IU] | INTRAMUSCULAR | Status: DC | PRN
  Filled 2017-12-14: qty 1

## 2017-12-14 MED ORDER — SODIUM CHLORIDE 0.9 % IV SOLN: 100.0000 mL | INTRAVENOUS | Status: DC | PRN

## 2017-12-14 MED ORDER — WARFARIN SODIUM 4 MG PO TABS
4.0000 mg | ORAL_TABLET | Freq: Once | ORAL | Status: AC
  Administered 2017-12-14: 4 mg via ORAL
  Filled 2017-12-14: qty 1

## 2017-12-14 MED ORDER — HEPARIN SODIUM (PORCINE) 1000 UNIT/ML DIALYSIS
20.0000 [IU]/kg | INTRAMUSCULAR | Status: DC | PRN
  Filled 2017-12-14: qty 2

## 2017-12-14 MED ORDER — INSULIN ASPART 100 UNIT/ML ~~LOC~~ SOLN: 0.0000 [IU] | Freq: Three times a day (TID) | SUBCUTANEOUS | Status: DC

## 2017-12-14 MED ORDER — LIDOCAINE HCL (PF) 1 % IJ SOLN: 5.0000 mL | INTRAMUSCULAR | Status: DC | PRN

## 2017-12-14 MED ORDER — ALBUMIN HUMAN 25 % IV SOLN
25.0000 g | Freq: Once | INTRAVENOUS | Status: AC
  Administered 2017-12-14: 25 g via INTRAVENOUS

## 2017-12-14 MED ORDER — PENTAFLUOROPROP-TETRAFLUOROETH EX AERO: 1.0000 "application " | INHALATION_SPRAY | CUTANEOUS | Status: DC | PRN

## 2017-12-14 MED ORDER — ALBUMIN HUMAN 25 % IV SOLN
INTRAVENOUS | Status: AC
  Administered 2017-12-14: 25 g via INTRAVENOUS
  Filled 2017-12-14: qty 100

## 2017-12-14 MED ORDER — LIDOCAINE-PRILOCAINE 2.5-2.5 % EX CREA
1.0000 "application " | TOPICAL_CREAM | CUTANEOUS | Status: DC | PRN
  Filled 2017-12-14: qty 5

## 2017-12-14 MED ORDER — ALTEPLASE 2 MG IJ SOLR: 2.0000 mg | Freq: Once | INTRAMUSCULAR | Status: DC | PRN

## 2017-12-14 NOTE — Progress Notes (Signed)
Hypoglycemic Event  CBG: 68 AT 0805  Treatment:  OJ x 1 cup  Symptoms: NONE  Follow-up CBG: TCCE:[id number] CBG Result:79  Possible Reasons for Event: Comments/MD notified:    Nechama Guard

## 2017-12-14 NOTE — Procedures (Signed)
Patient was seen on dialysis and the procedure was supervised.  BFR 400  Via TDC BP is  101/52.   Patient appears to be tolerating treatment well  Patrick [name] A 12/14/2017

## 2017-12-14 NOTE — Progress Notes (Addendum)
Hypoglycemic Event  CBG: 65 @ 1714  Treatment: OJ X 1-->recheck=58 (@1751 ) and 66---(@1803 )--->OJ X1 given again  Symptoms: NONE  Follow-up CBG: Time: 7741 CBG Result: 84  Possible Reasons for Event:  POST HEMO  Comments/MD notified:    Nechama Guard

## 2017-12-14 NOTE — Progress Notes (Signed)
ANTICOAGULATION CONSULT NOTE - Follow Up Consult  Pharmacy Consult for Heparin/Coumadin  Indication: atrial fibrillation  Allergies  Allergen Reactions  . Nsaids     Kidney disease    Patient Measurements: Height: 5\' 7"  (170.2 cm)(measured at bedside by RT) Weight: 178 lb 1.6 oz (80.8 kg) IBW/kg (Calculated) : 66.1 Heparin Dosing Weight: 80 kg  Vital Signs: Temp: 97.7 F (36.5 C) (07/18 0809) Temp Source: Oral (07/18 0809) BP: 116/77 (07/18 0809) Pulse Rate: 63 (07/18 0809)  Labs: Recent Labs    12/12/17 0650 12/12/17 1327 12/12/17 1811 12/13/17 0351 12/14/17 0622  HGB 9.1*  --   --  9.5* 9.4*  HCT 30.0*  --   --  31.1* 30.8*  PLT 141*  --   --  157 145*  LABPROT 21.2* 21.1*  --  20.5* 18.2*  INR 1.86 1.84  --  1.78 1.53  HEPARINUNFRC 0.27*  --  0.38 0.46 0.35  CREATININE 5.92*  --   --   --   --     Estimated Creatinine Clearance: 12 mL/min (A) (by C-G formula based on SCr of 5.92 mg/dL (H)).  Assessment:  67 yo male admitted with VT.  On chronic Coumadin for afib with INR 3.02 on admission 12/07/17.  Last PTA Coumadin dose recorded as 12/06/17. Heparin started 7/14 PM when INR <2.   S/p cardiac cath 7/17. No signs/symptoms of bleeding. Heparin level at goal.   Pharmacy consulted to resume Coumadin 7/18. Patient received vitamin K 2.5mg  PO 7/17 prior to cardiac cath and INR subtherapeutic at 1.53.  Home regimen Coumadin 2mg  daily.   Goal of Therapy:  Heparin level 0.3-0.7 units/ml Monitor platelets by anticoagulation protocol: Yes  INR: 2-3      Plan:  Continue Heparin at 1850 units/hr to bridge until INR therapeutic  Daily heparin level, PT/INR and CBC. Resume Coumadin 4mg  today and will likely continue home regimen tomorrow.   [name], Sherian Rein D PGY1 Pharmacy Resident  Phone [phone] 12/14/2017      11:33 AM

## 2017-12-14 NOTE — Progress Notes (Signed)
Patient Demographics:    Patrick [name], is a 67 y.o. male, DOB - 02-May-1951, MEQ:[id number]  Admit date - 12/07/2017   Admitting Physician Reyne Dumas, MD  Outpatient Primary MD for the patient is Patient, [name]  LOS - 7   Chief Complaint  Patient presents with  . Altered Mental Status        Subjective:    Patrick [name] today has no fevers, no emesis,  No chest pain, tolerated tolerated hemodialysis well  Assessment  & Plan :    Active Problems:   Acute on chronic combined systolic and diastolic CHF (congestive heart failure) (HCC)   Acute respiratory failure with hypoxia (HCC)   VT (ventricular tachycardia) ([place])   Respiratory arrest ([place])  Brief Summary 67 y.o.malewith a hx of subtotal occlusion of the small codominantRCA and 70% OMtreated medically, S-D-CHF, HTN, HLD, borderline DM, SBO 2009, BPH, gout, OA. 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 tach subsequently intubated on 2019 the ED extubated on 12/08/2017, transferred to hospitalist service on 12/10/2017.,  Left Heart catheterization on 12/13/2017 with obstructive CAD unchanged from September 2017   Plan:- 1)S/p VTach Arrest--stable at this time, cardiology input appreciated,  left heart catheterization on 12/13/2017 noted, medical management advised, continue aspirin and amiodarone as well as metoprolol  2)ESRD--last hemodialysis session on 12/12/2017 nephrology input appreciated  3)Coronary Artery Disease-patient has subtotally occluded codominant RCA,, cardiology input appreciated, Left Heart catheterization on 12/13/2017 with obstructive CAD unchanged from September 2017, There is no change in coronary anatomy compared to September  2017. His cardiomyopathy is out of proportion to his CAD, restart Coumadin, c/n IV heparin bridge, aspirin and metoprolol as ordered  4)Persistent atrial fibrillation--stable at this time, Coumadin on hold to allow for left heart catheterization, use IV heparin bridge, amiodarone as ordered, continue metoprolol 12.5 mg twice daily for rate control  5)HFrEF/ ischemic cardiomyopathy/systolic dysfunction CHF--EF 15 to 20%, neurology input appreciated,  6)DM2-stable, last known A1c 5.7, Use Novolog/Humalog Sliding scale insulin with Accu-Cheks/Fingersticks as ordered   7)Anemia of CKD--- ASA/EPO agent as per nephrology service, hemoglobin is stable above 9 at this time,  Code Status : full   Disposition Plan  : TBD  Consults  : Nephrology/cardiology   DVT Prophylaxis  : IV heparin drip subsequently will be back on Coumadin Lab Results  Component Value Date   PLT 145 (L) 12/14/2017    Inpatient Medications  Scheduled Meds: . Chlorhexidine Gluconate Cloth  6 each Topical Q0600  . darbepoetin (ARANESP) injection - DIALYSIS  60 mcg Intravenous Q Thu-HD  . famotidine  20 mg Oral Daily  . insulin aspart  0-9 Units Subcutaneous TID WC  . mexiletine  150 mg Oral Q12H  . ranolazine  500 mg Oral BID  . sodium chloride flush  3 mL Intravenous Q12H  . traZODone  50 mg Oral QHS  . warfarin  4 mg Oral ONCE-1800  . Warfarin - Pharmacist Dosing Inpatient   Does not apply q1800   Continuous Infusions: . sodium chloride    . dextrose Stopped (12/13/17  1257)  . heparin 1,850 Units/hr (12/14/17 1307)   PRN Meds:.sodium chloride, acetaminophen, docusate, ondansetron (ZOFRAN) IV, sodium chloride flush    Anti-infectives (From admission, onward)   None        Objective:   Vitals:   12/14/17 1600 12/14/17 1630 12/14/17 1649 12/14/17 1659  BP: 106/88 (!) 90/56 (!) 90/56 (!) 107/57  Pulse: 61 60 60 69  Resp: 20 19 19 19   Temp:    (!) 97.4 F (36.3 C)  TempSrc:    Axillary  SpO2:     100%  Weight:    80.3 kg (177 lb 0.5 oz)  Height:        Wt Readings from Last 3 Encounters:  12/14/17 80.3 kg (177 lb 0.5 oz)  10/27/17 82.6 kg (182 lb 1.6 oz)  08/29/17 97.5 kg (215 lb)     Intake/Output Summary (Last 24 hours) at 12/14/2017 1721 Last data filed at 12/14/2017 1659 Gross per 24 hour  Intake 795.03 ml  Output 550 ml  Net 245.03 ml     Physical Exam  Gen:- Awake Alert,  In no apparent distress  HEENT:- [place].AT, No sclera icterus Neck-Supple Neck,.  Right IJ hemodialysis catheter site is clean dry and intact Lungs-  CTAB , good air movement CV- S1, S2 normal Abd-  +ve B.Sounds, Abd Soft, No tenderness, colostomy bag with fecal contents Extremity/Skin:- No  edema,   good pulses Psych-affect is appropriate, oriented x3 Neuro-no new focal deficits, no tremors GU-Foley catheter  Data Review:   Micro Results Recent Results (from the past 240 hour(s))  Culture, blood (routine x 2)     Status: None   Collection Time: 12/07/17  4:00 AM  Result Value Ref Range Status   Specimen Description BLOOD RIGHT HAND  Final   Special Requests   Final    BOTTLES DRAWN AEROBIC AND ANAEROBIC Blood Culture adequate volume   Culture   Final    NO GROWTH 5 DAYS Performed at [facility], [place] [address], [place], [place] [address]    Report Status 12/12/2017 FINAL  Final  Culture, blood (routine x 2)     Status: None   Collection Time: 12/07/17  4:18 AM  Result Value Ref Range Status   Specimen Description BLOOD LEFT HAND  Final   Special Requests   Final    BOTTLES DRAWN AEROBIC AND ANAEROBIC Blood Culture adequate volume   Culture   Final    NO GROWTH 5 DAYS Performed at [facility], [place] [address] [place], [place] [address]    Report Status 12/12/2017 FINAL  Final  MRSA PCR Screening     Status: Abnormal   Collection Time: 12/07/17  6:04 AM  Result Value Ref Range Status   MRSA by PCR POSITIVE (A) NEGATIVE Final    Comment:        The GeneXpert MRSA  Assay (FDA approved for NASAL specimens only), is one component of a comprehensive MRSA colonization surveillance program. It is not intended to diagnose MRSA infection nor to guide or monitor treatment for MRSA infections. CRITICAL RESULT CALLED TO, READ BACK BY AND VERIFIED WITH: RN C RICE 12/07/17 AT 815 BY CM Performed at [facility], [place] [address] [place], [place] [address]     Radiology Reports Ct Head Wo Contrast  Result Date: 12/07/2017 CLINICAL DATA:  Found down at side of bed, cardioverted. History of hypertension, hyperlipidemia. EXAM: CT HEAD WITHOUT CONTRAST CT CERVICAL SPINE WITHOUT CONTRAST  TECHNIQUE: Multidetector CT imaging of the head and cervical spine was performed following the standard protocol without intravenous contrast. Multiplanar CT image reconstructions of the cervical spine were also generated. COMPARISON:  CT HEAD June 01, 2014 FINDINGS: CT HEAD FINDINGS BRAIN: No intraparenchymal hemorrhage, mass effect nor midline shift. Moderate parenchymal brain volume loss. No hydrocephalus. Patchy supratentorial white matter hypodensities. No acute large vascular territory infarcts. No abnormal extra-axial fluid collections. Basal cisterns are patent. VASCULAR: Moderate calcific atherosclerosis of the carotid siphons. SKULL: No skull fracture. No significant scalp soft tissue swelling. Focal calcified parietal convexity tricholemmal cyst. SINUSES/ORBITS: Mild-to-moderate paranasal sinus mucosal thickening. Mastoid air cells are well aerated.The included ocular globes and orbital contents are non-suspicious. OTHER: Life-support lines in place. CT CERVICAL SPINE FINDINGS ALIGNMENT: Straightened lordosis.  Vertebral bodies in alignment. SKULL BASE AND VERTEBRAE: Cervical vertebral bodies and posterior elements are intact. Moderate to severe C3-4 through C6-7 disc height loss with endplate spurring compatible with degenerative discs, moderate C2-3. C1-2 articulation  maintained with moderate arthropathy. No destructive bony lesions. SOFT TISSUES AND SPINAL CANAL: Nonacute. Mild calcific atherosclerosis carotid bifurcations. DISC LEVELS: Severe LEFT C3-4, RIGHT C4-5 neural foraminal narrowing. Mild canal stenosis C6-7. UPPER CHEST: Lung apices are clear. OTHER: Life-support lines in place. IMPRESSION: CT HEAD: 1. No acute intracranial process. 2. Moderate parenchymal brain volume loss. 3. Moderate chronic small vessel ischemic changes. CT CERVICAL SPINE: 1. No fracture or malalignment. Electronically Signed   By: Elon Alas M.D.   On: 12/07/2017 03:20   Ct Cervical Spine Wo Contrast  Result Date: 12/07/2017 CLINICAL DATA:  Found down at side of bed, cardioverted. History of hypertension, hyperlipidemia. EXAM: CT HEAD WITHOUT CONTRAST CT CERVICAL SPINE WITHOUT CONTRAST TECHNIQUE: Multidetector CT imaging of the head and cervical spine was performed following the standard protocol without intravenous contrast. Multiplanar CT image reconstructions of the cervical spine were also generated. COMPARISON:  CT HEAD June 01, 2014 FINDINGS: CT HEAD FINDINGS BRAIN: No intraparenchymal hemorrhage, mass effect nor midline shift. Moderate parenchymal brain volume loss. No hydrocephalus. Patchy supratentorial white matter hypodensities. No acute large vascular territory infarcts. No abnormal extra-axial fluid collections. Basal cisterns are patent. VASCULAR: Moderate calcific atherosclerosis of the carotid siphons. SKULL: No skull fracture. No significant scalp soft tissue swelling. Focal calcified parietal convexity tricholemmal cyst. SINUSES/ORBITS: Mild-to-moderate paranasal sinus mucosal thickening. Mastoid air cells are well aerated.The included ocular globes and orbital contents are non-suspicious. OTHER: Life-support lines in place. CT CERVICAL SPINE FINDINGS ALIGNMENT: Straightened lordosis.  Vertebral bodies in alignment. SKULL BASE AND VERTEBRAE: Cervical vertebral bodies  and posterior elements are intact. Moderate to severe C3-4 through C6-7 disc height loss with endplate spurring compatible with degenerative discs, moderate C2-3. C1-2 articulation maintained with moderate arthropathy. No destructive bony lesions. SOFT TISSUES AND SPINAL CANAL: Nonacute. Mild calcific atherosclerosis carotid bifurcations. DISC LEVELS: Severe LEFT C3-4, RIGHT C4-5 neural foraminal narrowing. Mild canal stenosis C6-7. UPPER CHEST: Lung apices are clear. OTHER: Life-support lines in place. IMPRESSION: CT HEAD: 1. No acute intracranial process. 2. Moderate parenchymal brain volume loss. 3. Moderate chronic small vessel ischemic changes. CT CERVICAL SPINE: 1. No fracture or malalignment. Electronically Signed   By: Elon Alas M.D.   On: 12/07/2017 03:20   Dg Chest Port 1 View  Result Date: 12/08/2017 CLINICAL DATA:  Respiratory failure, endotracheal tube present. EXAM: PORTABLE CHEST 1 VIEW COMPARISON:  Radiograph December 07, 2017. FINDINGS: Stable cardiomegaly. Endotracheal and nasogastric tubes are unchanged in position. Right internal jugular dialysis catheter is unchanged. Stable  mild right basilar subsegmental atelectasis is noted. Stable left basilar opacity is noted concerning for atelectasis with associated pleural effusion. No pneumothorax is noted. Bony thorax is unremarkable. IMPRESSION: Stable support apparatus. Stable bibasilar opacities, left greater than right, as described above. Electronically Signed   By: Marijo Conception, M.D.   On: 12/08/2017 08:51   Dg Chest Port 1 View  Result Date: 12/07/2017 CLINICAL DATA:  Intubation. EXAM: PORTABLE CHEST 1 VIEW COMPARISON:  Chest radiograph Oct 23, 2017 FINDINGS: Endotracheal tube tip projects 16 mm above the carina. Nasogastric tube looped in proximal stomach, distal tip projecting at GE junction. Multiple EKG lines overlie the patient and may obscure subtle underlying pathology. Tunneled dialysis catheter via RIGHT internal jugular  venous approach with distal tip projecting in RIGHT atrium. Cardiomegaly and pulmonary vascular congestion. Low inspiratory examination with crowded vascular markings. Potential LEFT pleural effusion. RIGHT costophrenic angle out of field of view. No pneumothorax. Soft tissue planes and included osseous structures are non suspicious. IMPRESSION: Endotracheal tube tip projects 16 mm above the carina. Nasogastric tube looped in proximal stomach, distal tip projecting at GE junction. Cardiomegaly and pulmonary vascular congestion. Electronically Signed   By: Elon Alas M.D.   On: 12/07/2017 02:24     CBC Recent Labs  Lab 12/08/17 0154 12/09/17 0307 12/10/17 0620 12/11/17 0442 12/12/17 0650 12/13/17 0351 12/14/17 0622  WBC 4.9 7.2 6.9 7.1 5.8 6.2 6.1  HGB 8.8* 9.1* 9.2* 9.6* 9.1* 9.5* 9.4*  HCT 29.1* 31.6* 30.8* 31.9* 30.0* 31.1* 30.8*  PLT 136* 133* 126* 172 141* 157 145*  MCV 87.9 91.9 87.5 86.9 86.7 87.1 87.3  MCH 26.6 26.5 26.1 26.2 26.3 26.6 26.6  MCHC 30.2 28.8* 29.9* 30.1 30.3 30.5 30.5  RDW 19.3* 19.6* 19.3* 19.1* 19.1* 19.5* 20.0*  LYMPHSABS 0.6* 0.9 0.7  --   --   --   --   MONOABS 0.8 1.1* 1.0  --   --   --   --   EOSABS 0.0 0.0 0.1  --   --   --   --   BASOSABS 0.0 0.1 0.0  --   --   --   --     Chemistries  Recent Labs  Lab 12/09/17 0307 12/09/17 0509 12/10/17 0620 12/11/17 0442 12/12/17 0650 12/14/17 1058  NA 134*  --  134* 134* 132* 133*  K 4.4  --  3.6 3.9 3.8 4.4  CL 100  --  99 97* 96* 96*  CO2 20*  --  24 23 23 24   GLUCOSE 84  --  82 89 89 94  BUN 40*  --  22 32* 41* 32*  CREATININE 5.00*  --  3.77* 4.91* 5.92* 5.34*  CALCIUM 8.7*  --  8.6* 8.7* 8.6* 8.6*  MG  --  2.1  --   --   --  1.6*   ------------------------------------------------------------------------------------------------------------------ No results for input(s): CHOL, HDL, LDLCALC, TRIG, CHOLHDL, LDLDIRECT in the last 72 hours.  Lab Results  Component Value Date   HGBA1C 5.7 (H)  09/23/2016   ------------------------------------------------------------------------------------------------------------------ No results for input(s): TSH, T4TOTAL, T3FREE, THYROIDAB in the last 72 hours.  Invalid input(s): FREET3 ------------------------------------------------------------------------------------------------------------------ No results for input(s): VITAMINB12, FOLATE, FERRITIN, TIBC, IRON, RETICCTPCT in the last 72 hours.  Coagulation profile Recent Labs  Lab 12/11/17 1229 12/12/17 0650 12/12/17 1327 12/13/17 0351 12/14/17 0622  INR 1.89 1.86 1.84 1.78 1.53    No results for input(s): DDIMER in the last 72 hours.  Cardiac Enzymes  Recent Labs  Lab 12/07/17 2123  TROPONINI 1.91*   ------------------------------------------------------------------------------------------------------------------    Component Value Date/Time   BNP 487.5 (H) 05/23/2017 1332     Roxan Hockey M.D on 12/14/2017 at 5:21 PM   Go to [email or web address] - password [email or web address] for contact info  [facility] - Office  [phone]

## 2017-12-14 NOTE — Progress Notes (Signed)
Progress Note  Patient Name: Patrick [name] Date of Encounter: 12/14/2017  Primary Cardiologist: Minus Breeding, MD   Subjective   Reports feeling weak. Has some orthopnea. Denies chest pain or palpitations.   Inpatient Medications    Scheduled Meds: . Chlorhexidine Gluconate Cloth  6 each Topical Q0600  . darbepoetin (ARANESP) injection - DIALYSIS  60 mcg Intravenous Q Thu-HD  . famotidine  20 mg Oral Daily  . insulin aspart  2-6 Units Subcutaneous Q4H  . mexiletine  150 mg Oral Q12H  . ranolazine  500 mg Oral BID  . sodium chloride flush  3 mL Intravenous Q12H  . traZODone  50 mg Oral QHS   Continuous Infusions: . sodium chloride    . dextrose Stopped (12/13/17 1257)  . heparin 1,850 Units/hr (12/14/17 0000)   PRN Meds: sodium chloride, acetaminophen, docusate, ondansetron (ZOFRAN) IV, sodium chloride flush   Vital Signs    Vitals:   12/13/17 1935 12/14/17 0030 12/14/17 0500 12/14/17 0809  BP: 114/82 101/65 113/76 116/77  Pulse: 63 (!) 59 63 63  Resp: (!) 22 13 (!) 21 18  Temp: 97.6 F (36.4 C) (!) 97.3 F (36.3 C) 97.7 F (36.5 C) 97.7 F (36.5 C)  TempSrc: Oral Axillary Oral Oral  SpO2: 100% 100% 100%   Weight:   178 lb 1.6 oz (80.8 kg)   Height:        Intake/Output Summary (Last 24 hours) at 12/14/2017 0929 Last data filed at 12/14/2017 0000 Gross per 24 hour  Intake 395.03 ml  Output 350 ml  Net 45.03 ml   Filed Weights   12/12/17 1144 12/13/17 0407 12/14/17 0500  Weight: 176 lb 5.9 oz (80 kg) 176 lb 12.8 oz (80.2 kg) 178 lb 1.6 oz (80.8 kg)    Telemetry    Atrial fibrillation with controlled/slow ventricular rate - Personally Reviewed  Physical Exam   GEN: sitting on the edge of the hospital bed in no acute distress.   Neck: No JVD, no carotid bruits Cardiac: IRIR, +murmurs, no rubs or gallops.  Respiratory: Clear to auscultation bilaterally, no wheezes/ rales/ rhonchi GI: NABS, Soft, nontender, non-distended  MS: 1+ ankle edema; No  deformity. Neuro:  Nonfocal, moving all extremities spontaneously Psych: Normal affect   Labs    Chemistry Recent Labs  Lab 12/10/17 0620 12/11/17 0442 12/12/17 0650  NA 134* 134* 132*  K 3.6 3.9 3.8  CL 99 97* 96*  CO2 24 23 23   GLUCOSE 82 89 89  BUN 22 32* 41*  CREATININE 3.77* 4.91* 5.92*  CALCIUM 8.6* 8.7* 8.6*  GFRNONAA 15* 11* 9*  GFRAA 17* 13* 10*  ANIONGAP 11 14 13      Hematology Recent Labs  Lab 12/12/17 0650 12/13/17 0351 12/14/17 0622  WBC 5.8 6.2 6.1  RBC 3.46* 3.57* 3.53*  HGB 9.1* 9.5* 9.4*  HCT 30.0* 31.1* 30.8*  MCV 86.7 87.1 87.3  MCH 26.3 26.6 26.6  MCHC 30.3 30.5 30.5  RDW 19.1* 19.5* 20.0*  PLT 141* 157 145*    Cardiac Enzymes Recent Labs  Lab 12/07/17 1412 12/07/17 2123  TROPONINI 1.71* 1.91*   No results for input(s): TROPIPOC in the last 168 hours.   BNPNo results for input(s): BNP, PROBNP in the last 168 hours.   DDimer No results for input(s): DDIMER in the last 168 hours.   Radiology    No results found.  Cardiac Studies   Left heart catheterization 12/13/17:  Prox RCA to Mid RCA lesion is  100% stenosed.  Ost LPDA to LPDA lesion is 90% stenosed.  Ost 1st Mrg to 1st Mrg lesion is 70% stenosed.  Ost 1st Diag to 1st Diag lesion is 70% stenosed.  LV end diastolic pressure is normal.   1. Obstructive CAD. The patient has a left dominant circulation.    - 70% first diagonal    - 70% first OM    - 90% left PDA. A sub branch of the PDA is occluded. The vessel is diffusely diseased.    - 100% nondominant RCA with collaterals. CTO 2. Normal LVEDP  Plan: There is no change in coronary anatomy compared to September 2017. His cardiomyopathy is out of proportion to his CAD. No acute lesion to explain his VT. Recommend EP consult. Will resume IV heparin 8 hours post sheath removal. Plan to resume coumadin once cleared by EP.    Patient Profile     67 y.o. male with PMH of CAD, NICM (depressed EF historically felt out of  proportion to his CAD), DM, chronic CHF (mixed), colon cancer with resection and permanent colostomy, AFib, CKD >>> ESRF started HD May this year, HTN, HLD, RBBB, appears as well with functional decline, who presented with witnessed VT arrest s/p successful resuscitation.   Assessment & Plan    1. VT: patient underwent LHC yesterday to further evaluate VT arrest, found to have no significant changes in coronary anatomy to explain VT, and felt cardiomyopathy was out of proportion to CAD. EP evaluated the patient yesterday and after discussion with Dr. Percival Spanish, decided to pursue conservative management with ranolazine and mexiletine.  - Continue ranolazine and mexiletine  - Continue to monitor daily BMET and Mg - goal K>4, Mg>2  2. CAD: noted to have multivessel disease on LHC yesterday with no significant change in coronary blockages when compared to 2017 cath.  - Resume home statin today - BP still on the soft side with intermittent bradycardia so will continue to hold home BBlocker  3. Atrial fibrillation: previously on coumadin, transitioned to heparin gtt on admission. Rate well controlled over the past 24 hours - Can transition back to coumadin when no further procedures are planned - Continue to hold home BBlocker due to bradycardia  4. Chronic combined CHF: EF 15-20% this admission; previously 30%. With some orthopnea complaints and ankle edema on exam today. Planned for HD today - Continue volume management per nephrology - Unable to restart home BBlocker due to bradycardia  5. ESRD on HD: TTS schedule. Planned for HD today - Continue management per nephrology     For questions or updates, please contact [place] Please consult [email or web address] for contact info under Cardiology/STEMI.      Signed, Abigail Butts, PA-C  12/14/2017, 9:29 AM   [phone]

## 2017-12-14 NOTE — Progress Notes (Signed)
[name] Progress Note   Subjective:   Patient sitting on bedside about to eat breakfast.  States he is feeling a little confused this AM from "all the moving around to different rooms."  Oriented to self, month, year, & fact he is due to HD today.  Not oriented to place Patrick [name]) or day (Sat).  Reports intermittent SOB this AM, currently ok.   Objective Vitals:   12/13/17 1935 12/14/17 0030 12/14/17 0500 12/14/17 0809  BP: 114/82 101/65 113/76 116/77  Pulse: 63 (!) 59 63 63  Resp: (!) 22 13 (!) 21 18  Temp: 97.6 F (36.4 C) (!) 97.3 F (36.3 C) 97.7 F (36.5 C) 97.7 F (36.5 C)  TempSrc: Oral Axillary Oral Oral  SpO2: 100% 100% 100%   Weight:   80.8 kg (178 lb 1.6 oz)   Height:       Physical Exam General:NAD, pale, chronically ill appearing male Heart:RRR, +9/7 systolic murmur Lungs:CTAB, breath sounds decreased on L Extremities:1+ edema Dialysis Access: R IJ [facility]   Filed Weights   12/12/17 1144 12/13/17 0407 12/14/17 0500  Weight: 80 kg (176 lb 5.9 oz) 80.2 kg (176 lb 12.8 oz) 80.8 kg (178 lb 1.6 oz)    Intake/Output Summary (Last 24 hours) at 12/14/2017 1000 Last data filed at 12/14/2017 0900 Gross per 24 hour  Intake 615.03 ml  Output 350 ml  Net 265.03 ml    Additional Objective Labs: Basic Metabolic Panel: Recent Labs  Lab 12/10/17 0620 12/11/17 0442 12/12/17 0650  NA 134* 134* 132*  K 3.6 3.9 3.8  CL 99 97* 96*  CO2 24 23 23   GLUCOSE 82 89 89  BUN 22 32* 41*  CREATININE 3.77* 4.91* 5.92*  CALCIUM 8.6* 8.7* 8.6*   CBC: Recent Labs  Lab 12/08/17 0154 12/09/17 0307 12/10/17 0620 12/11/17 0442 12/12/17 0650 12/13/17 0351 12/14/17 0622  WBC 4.9 7.2 6.9 7.1 5.8 6.2 6.1  NEUTROABS 3.4 5.1 5.1  --   --   --   --   HGB 8.8* 9.1* 9.2* 9.6* 9.1* 9.5* 9.4*  HCT 29.1* 31.6* 30.8* 31.9* 30.0* 31.1* 30.8*  MCV 87.9 91.9 87.5 86.9 86.7 87.1 87.3  PLT 136* 133* 126* 172 141* 157 145*   Cardiac Enzymes: Recent Labs  Lab 12/07/17 1412  12/07/17 2123  TROPONINI 1.71* 1.91*   CBG: Recent Labs  Lab 12/13/17 1833 12/13/17 1931 12/14/17 0030 12/14/17 0508 12/14/17 0805  GLUCAP 64* 111* 90 70 62*   Lab Results  Component Value Date   INR 1.53 12/14/2017   INR 1.78 12/13/2017   INR 1.84 12/12/2017   Studies/Results: No results found.  Medications: . sodium chloride    . dextrose Stopped (12/13/17 1257)  . heparin 1,850 Units/hr (12/14/17 0000)   . Chlorhexidine Gluconate Cloth  6 each Topical Q0600  . darbepoetin (ARANESP) injection - DIALYSIS  60 mcg Intravenous Q Thu-HD  . famotidine  20 mg Oral Daily  . insulin aspart  2-6 Units Subcutaneous Q4H  . mexiletine  150 mg Oral Q12H  . ranolazine  500 mg Oral BID  . sodium chloride flush  3 mL Intravenous Q12H  . traZODone  50 mg Oral QHS    Dialysis Orders: [facility] (recent start) 4h 400/800 79kg 3K/ 2Ca bath Hep 2700 R IJ TDC - mircera 75 every 2 wks last 6/27  Home meds: - coumadin 2mg  hs/ lipitor 10 mg hs - wellbutrin 100mg  qd/ baclofen 5 mg tid prn/ percocet prn/ desyrel  50 hs - protonix 40 qd/ sl ntg prn/ mvi/ allopurinol 300 qd   Assessment/Plan: 1.VTach / witnessed arrest - Cardio following - on amiodarone. LHC showed no significant change to explain VT.  EP consulted and decided on conservative management. Per cardio 2. Confusion - per pt chronic issue.  Will check and see if improvement noted post HD.  3. A. Fib - on coumadin at home. Holding BB due to bradycardia. Per cardio 4. CHF- EF reduced from 30% > 15-20%. Per cardio 5. ESRD -HD TTS - recent start. Last K 3.8, using 4K bath and to recheck K prior. Orders written for HD today per regular schedule with UF goal to edw.   6. Anemia of CKD-Hgb 9.5. Continue aranesp 16mcg qwk (thurs) while admitted.  7. Secondary hyperparathyroidism -Ca 8.6, last phos 5.2. Not on VDRA or binders.  8. HTN/volume -BP soft, holding BB.  Plan for net UF removal to EDW today. LE edema improved  but still present on exam, new to HD still establishing EDW, continue to titrate down as tolerated, should be able to meet. 9. Nutrition -Renal diet with fluid restrictions. Renavite. 10. Hx colon cancer 11. Hx depression 12. Hx gout    [name], PA-C [facility] Pager: [phone] 12/14/2017,10:00 AM  LOS: 7 days

## 2017-12-14 NOTE — Progress Notes (Signed)
ANTICOAGULATION CONSULT NOTE - Follow Up Consult  Pharmacy Consult for Heparin (Coumadin on hold) Indication: atrial fibrillation  Allergies  Allergen Reactions  . Nsaids     Kidney disease    Patient Measurements: Height: 5\' 7"  (170.2 cm)(measured at bedside by RT) Weight: 178 lb 1.6 oz (80.8 kg) IBW/kg (Calculated) : 66.1 Heparin Dosing Weight: 80 kg  Vital Signs: Temp: 97.7 F (36.5 C) (07/18 0809) Temp Source: Oral (07/18 0809) BP: 116/77 (07/18 0809) Pulse Rate: 63 (07/18 0809)  Labs: Recent Labs    12/12/17 0650 12/12/17 1327 12/12/17 1811 12/13/17 0351 12/14/17 0622  HGB 9.1*  --   --  9.5* 9.4*  HCT 30.0*  --   --  31.1* 30.8*  PLT 141*  --   --  157 145*  LABPROT 21.2* 21.1*  --  20.5* 18.2*  INR 1.86 1.84  --  1.78 1.53  HEPARINUNFRC 0.27*  --  0.38 0.46 0.35  CREATININE 5.92*  --   --   --   --     Estimated Creatinine Clearance: 12 mL/min (A) (by C-G formula based on SCr of 5.92 mg/dL (H)).  Assessment:  67 yo male admitted with VT.  On chronic Coumadin for afib with INR 3.02 on admission 12/07/17.  Last PTA Coumadin dose recorded as 12/06/17. Heparin started 7/14 PM when INR <2.   S/p cardiac cath 7/17. No signs/symptoms of bleeding. Heparin level at goal.   Goal of Therapy:  Heparin level 0.3-0.7 units/ml Monitor platelets by anticoagulation protocol: Yes    Plan:   Continue Heparin at 1850 units/hr.  Daily heparin level, PT/INR and CBC.  Follow up plans and resuming Coumadin if no procedure.  Patrick [name], Patrick [name] D PGY1 Pharmacy Resident  Phone [phone] 12/14/2017      10:00 AM

## 2017-12-15 LAB — MAGNESIUM: MAGNESIUM: 1.7 mg/dL (ref 1.7–2.4)

## 2017-12-15 LAB — GLUCOSE, CAPILLARY
GLUCOSE-CAPILLARY: 73 mg/dL (ref 70–99)
GLUCOSE-CAPILLARY: 126 mg/dL — AB (ref 70–99)
Glucose-Capillary: 63 mg/dL — ABNORMAL LOW (ref 70–99)
Glucose-Capillary: 71 mg/dL (ref 70–99)

## 2017-12-15 LAB — CBC
HCT: 30 % — ABNORMAL LOW (ref 39.0–52.0)
Hemoglobin: 9 g/dL — ABNORMAL LOW (ref 13.0–17.0)
MCH: 26.9 pg (ref 26.0–34.0)
MCHC: 30 g/dL (ref 30.0–36.0)
MCV: 89.6 fL (ref 78.0–100.0)
Platelets: DECREASED 10*3/uL (ref 150–400)
RBC: 3.35 MIL/uL — ABNORMAL LOW (ref 4.22–5.81)
RDW: 20.1 % — ABNORMAL HIGH (ref 11.5–15.5)
WBC: 4.3 10*3/uL (ref 4.0–10.5)

## 2017-12-15 LAB — BASIC METABOLIC PANEL
ANION GAP: 11 (ref 5–15)
BUN: 13 mg/dL (ref 8–23)
CHLORIDE: 96 mmol/L — AB (ref 98–111)
CO2: 27 mmol/L (ref 22–32)
CREATININE: 3.3 mg/dL — AB (ref 0.61–1.24)
Calcium: 8.7 mg/dL — ABNORMAL LOW (ref 8.9–10.3)
GFR calc non Af Amer: 18 mL/min — ABNORMAL LOW (ref >60)
GFR, EST AFRICAN AMERICAN: 20 mL/min — AB (ref >60)
Glucose, Bld: 81 mg/dL (ref 70–99)
POTASSIUM: 3.5 mmol/L (ref 3.5–5.1)
SODIUM: 134 mmol/L — AB (ref 135–145)

## 2017-12-15 LAB — PROTIME-INR
INR: 1.59
Prothrombin Time: 18.8 seconds — ABNORMAL HIGH (ref 11.4–15.2)

## 2017-12-15 LAB — HEPARIN LEVEL (UNFRACTIONATED): HEPARIN UNFRACTIONATED: 0.38 [IU]/mL (ref 0.30–0.70)

## 2017-12-15 MED ORDER — MIRTAZAPINE 7.5 MG PO TABS
7.5000 mg | ORAL_TABLET | Freq: Every day | ORAL | Status: DC
  Administered 2017-12-15 – 2017-12-16 (×2): 7.5 mg via ORAL
  Filled 2017-12-15 (×2): qty 1

## 2017-12-15 MED ORDER — WARFARIN SODIUM 4 MG PO TABS
4.0000 mg | ORAL_TABLET | Freq: Once | ORAL | Status: AC
  Administered 2017-12-15: 4 mg via ORAL
  Filled 2017-12-15: qty 1

## 2017-12-15 MED ORDER — NEPRO/CARBSTEADY PO LIQD
237.0000 mL | Freq: Three times a day (TID) | ORAL | Status: DC
  Administered 2017-12-15 – 2017-12-17 (×6): 237 mL via ORAL

## 2017-12-15 MED ORDER — GLUCERNA SHAKE PO LIQD
237.0000 mL | Freq: Three times a day (TID) | ORAL | Status: DC
  Administered 2017-12-15 (×2): 237 mL via ORAL

## 2017-12-15 NOTE — Progress Notes (Addendum)
ANTICOAGULATION CONSULT NOTE - Follow Up Consult  Pharmacy Consult for Heparin/Coumadin  Indication: atrial fibrillation  Allergies  Allergen Reactions  . Nsaids     Kidney disease    Patient Measurements: Height: 5\' 7"  (170.2 cm)(measured at bedside by RT) Weight: 180 lb 12.4 oz (82 kg) IBW/kg (Calculated) : 66.1 Heparin Dosing Weight: 80 kg  Vital Signs: Temp: 97.6 F (36.4 C) (07/19 0731) Temp Source: Oral (07/19 0731) BP: 119/78 (07/19 0731) Pulse Rate: 59 (07/19 0731)  Labs: Recent Labs    12/13/17 0351 12/14/17 0622 12/14/17 1058 12/15/17 0513  HGB 9.5* 9.4*  --  9.0*  HCT 31.1* 30.8*  --  30.0*  PLT 157 145*  --  PLATELET CLUMPS NOTED ON SMEAR, COUNT APPEARS DECREASED  LABPROT 20.5* 18.2*  --  18.8*  INR 1.78 1.53  --  1.59  HEPARINUNFRC 0.46 0.35  --  0.38  CREATININE  --   --  5.34* 3.30*    Estimated Creatinine Clearance: 21.7 mL/min (A) (by C-G formula based on SCr of 3.3 mg/dL (H)).  Assessment:  67 yo male admitted with VT.  On chronic Coumadin for afib with INR 3.02 on admission 12/07/17.  Last PTA warfarin dose recorded as 12/06/17. Heparin started 7/14 PM when INR <2.  S/p cardiac cath 7/17 for which patient received vitamin K 2.5mg .   Pharmacy consulted to resume Coumadin on 7/18. Today INR continues to be subtherapeutic at 1.59. Likely still seeing effect of 7/17 vitamin K. Noted that patient recently discontinued from amiodarone. PTA regimen warfarin 2mg  daily.   No signs/symptoms of bleeding. Heparin level at goal.   Goal of Therapy:  Heparin level 0.3-0.7 units/ml Monitor platelets by anticoagulation protocol: Yes  INR: 2-3      Plan:  -Continue Heparin at 1850 units/hr to bridge until INR therapeutic  -Daily heparin level, PT/INR and CBC. -Continue Coumadin 4mg  today and consider continuing PTA regimen tomorrow if INR responds.   Janae Bridgeman, PharmD PGY1 Pharmacy Resident Phone: [phone] 12/15/2017 8:37 AM

## 2017-12-15 NOTE — Progress Notes (Signed)
Initial Nutrition Assessment  DOCUMENTATION CODES:   Non-severe (moderate) malnutrition in context of chronic illness  INTERVENTION:    Nepro Shake po TID, each supplement provides 425 kcal and 19 grams protein  Recommend liberalize diet to regular to help improve intake.   NUTRITION DIAGNOSIS:   Moderate Malnutrition related to chronic illness(ESRD) as evidenced by mild fat depletion, moderate fat depletion, mild muscle depletion, moderate muscle depletion.  GOAL:   Patient will meet greater than or equal to 90% of their needs  MONITOR:   PO intake, Supplement acceptance, Labs, I & O's  REASON FOR ASSESSMENT:   Consult Poor PO  ASSESSMENT:   67 yo male with PMH of HTN, HLD, DJD, CAD, CHF, ESRD who was admitted on 7/11 with ventricular tachycardia.   Patient reports very poor appetite and poor intake since starting on HD recently. He says foods Grant't taste very good and he just can't eat something that doesn't taste right. He is enjoying the Nepro supplements. Blood glucoses have been low, ranging from 63-73 this morning.   Labs and medications reviewed. Remeron added for appetite stimulant.  Per review of weight encounters, weights have fluctuated over the past year. Weight fluctuations likely associated with fluid shifts related to ESRD.   Labs reviewed. Sodium 134 (L) Medications reviewed and include Remeron.  NUTRITION - FOCUSED PHYSICAL EXAM:    Most Recent Value  Orbital Region  Moderate depletion  Upper Arm Region  Moderate depletion  Thoracic and Lumbar Region  Mild depletion  Buccal Region  Mild depletion  Temple Region  Moderate depletion  Clavicle Bone Region  Mild depletion  Clavicle and Acromion Bone Region  Mild depletion  Scapular Bone Region  Mild depletion  Dorsal Hand  Mild depletion  Patellar Region  No depletion  Anterior Thigh Region  Unable to assess  Posterior Calf Region  Mild depletion  Edema (RD Assessment)  None  Hair  Reviewed   Eyes  Reviewed  Mouth  Reviewed  Skin  Reviewed  Nails  Reviewed       Diet Order:   Diet Order           Diet renal/carb modified with fluid restriction Diet-HS Snack? Nothing; Fluid restriction: 1200 mL Fluid; Room service appropriate? Yes; Fluid consistency: Thin  Diet effective now          EDUCATION NEEDS:   No education needs have been identified at this time  Skin:  Skin Assessment: Skin Integrity Issues: Skin Integrity Issues:: Other (Comment) Other: MASD to coccyx  Last BM:  7/19  Height:   Ht Readings from Last 1 Encounters:  12/07/17 5\' 7"  (1.702 m)    Weight:   Wt Readings from Last 1 Encounters:  12/15/17 180 lb 12.4 oz (82 kg)    Ideal Body Weight:  67.3 kg  BMI:  Body mass index is 28.31 kg/m.  Estimated Nutritional Needs:   Kcal:  2200-2400  Protein:  100-125 gm  Fluid:  1.2 L    Molli Barrows, RD, LDN, [place] Pager [phone] After Hours Pager [phone]

## 2017-12-15 NOTE — Progress Notes (Signed)
Patient Demographics:    Patrick [name], is a 67 y.o. male, DOB - 12-04-1950, CNO:[id number]  Admit date - 12/07/2017   Admitting Physician Reyne Dumas, MD  Outpatient Primary MD for the patient is Patient, [name]  LOS - 8   Chief Complaint  Patient presents with  . Altered Mental Status        Subjective:    Patrick [name] today has no fevers, no emesis,  No chest pain, borderline low blood sugars noted, patient admits to poor appetite, No nausea, vomiting, diarrhea   Assessment  & Plan :    Active Problems:   Acute on chronic combined systolic and diastolic CHF (congestive heart failure) (HCC)   Acute respiratory failure with hypoxia (HCC)   VT (ventricular tachycardia) ([place])   Respiratory arrest ([place])  Brief Summary 67 y.o.malewith a hx of subtotal occlusion of the small codominantRCA and 70% OMtreated medically, S-D-CHF, HTN, HLD, borderline DM, SBO 2009, BPH, gout, OA. 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 tach subsequently intubated on 2019 the ED extubated on 12/08/2017, transferred to hospitalist service on 12/10/2017.,  Left Heart catheterization on 12/13/2017 with obstructive CAD unchanged from September 2017   Plan:- 1)S/p VTach Arrest--stable at this time, cardiology input appreciated,  left heart catheterization on 12/13/2017 noted, medical management advised, continue aspirin and amiodarone as well as metoprolol  2)ESRD--last hemodialysis session on 12/14/2017 nephrology input appreciated  3)Coronary Artery Disease-no chest pains , no on-going ACS type symptoms, patient has subtotally occluded codominant RCA,, cardiology input appreciated, Left Heart catheterization on 12/13/2017 with  obstructive CAD unchanged from September 2017, There is no change in coronary anatomy compared to September 2017. His cardiomyopathy is out of proportion to his CAD, c/n  Coumadin, c/n IV heparin bridge until INR is therapeutic, aspirin and metoprolol as ordered  4)Persistent atrial fibrillation--stable at this time, c/n  IV heparin bridge until INR is therapeutic, continue Coumadin, amiodarone as ordered, continue metoprolol 12.5 mg twice daily for rate control  5)HFrEF/ ischemic cardiomyopathy/systolic dysfunction CHF--EF 15 to 20%, cardiology input appreciated,  6)DM2-stable, last known A1c 5.7, Use Novolog/Humalog Sliding scale insulin with Accu-Cheks/Fingersticks as ordered   7)Anemia of CKD--- ASA/EPO agent as per nephrology service, hemoglobin is stable above 9 at this time,  8) anorexia,/poor appetite/hypoglycemia--- give nephro supplements, encourage increased oral intake, give Remeron for appetite stimulation  Code Status : full   Disposition Plan  : When INR is  Therapeutic, if no significant hypoglycemia Consults  : Nephrology/cardiology   DVT Prophylaxis  : IV heparin drip subsequently will be back on Coumadin Lab Results  Component Value Date   PLT  12/15/2017    PLATELET CLUMPS NOTED ON SMEAR, COUNT APPEARS DECREASED    Inpatient Medications  Scheduled Meds: . Chlorhexidine Gluconate Cloth  6 each Topical Q0600  . darbepoetin (ARANESP) injection - DIALYSIS  60 mcg Intravenous Q Thu-HD  . famotidine  20 mg Oral Daily  . feeding supplement (NEPRO CARB STEADY)  237 mL Oral TID BM  . mexiletine  150 mg Oral Q12H  .  mirtazapine  7.5 mg Oral QHS  . ranolazine  500 mg Oral BID  . sodium chloride flush  3 mL Intravenous Q12H  . warfarin  4 mg Oral ONCE-1800  . Warfarin - Pharmacist Dosing Inpatient   Does not apply q1800   Continuous Infusions: . sodium chloride    . heparin 1,850 Units/hr (12/15/17 0400)   PRN Meds:.sodium chloride, acetaminophen, docusate,  ondansetron (ZOFRAN) IV, sodium chloride flush    Anti-infectives (From admission, onward)   None        Objective:   Vitals:   12/15/17 0402 12/15/17 0403 12/15/17 0731 12/15/17 1155  BP: 107/63  119/78 121/81  Pulse: 72  (!) 59 66  Resp:   20 (!) 30  Temp: 97.8 F (36.6 C)  97.6 F (36.4 C) (!) 97.4 F (36.3 C)  TempSrc: Oral  Oral Oral  SpO2: 99%     Weight:  82 kg (180 lb 12.4 oz)    Height:        Wt Readings from Last 3 Encounters:  12/15/17 82 kg (180 lb 12.4 oz)  10/27/17 82.6 kg (182 lb 1.6 oz)  08/29/17 97.5 kg (215 lb)     Intake/Output Summary (Last 24 hours) at 12/15/2017 1539 Last data filed at 12/15/2017 1315 Gross per 24 hour  Intake 648.59 ml  Output 575 ml  Net 73.59 ml     Physical Exam  Gen:- Awake Alert,  In no apparent distress  HEENT:- [place].AT, No sclera icterus Neck-Supple Neck,.  Right IJ hemodialysis catheter site is clean dry and intact Lungs-  CTAB , good air movement CV- S1, S2 normal Abd-  +ve B.Sounds, Abd Soft, No tenderness, colostomy bag with fecal contents Extremity/Skin:- No  edema,   good pulses Psych-affect is appropriate, oriented x3 Neuro-no new focal deficits, no tremors GU-Foley catheter  Data Review:   Micro Results Recent Results (from the past 240 hour(s))  Culture, blood (routine x 2)     Status: None   Collection Time: 12/07/17  4:00 AM  Result Value Ref Range Status   Specimen Description BLOOD RIGHT HAND  Final   Special Requests   Final    BOTTLES DRAWN AEROBIC AND ANAEROBIC Blood Culture adequate volume   Culture   Final    NO GROWTH 5 DAYS Performed at [facility], [place] [address], [place], [place] [address]    Report Status 12/12/2017 FINAL  Final  Culture, blood (routine x 2)     Status: None   Collection Time: 12/07/17  4:18 AM  Result Value Ref Range Status   Specimen Description BLOOD LEFT HAND  Final   Special Requests   Final    BOTTLES DRAWN AEROBIC AND ANAEROBIC Blood Culture  adequate volume   Culture   Final    NO GROWTH 5 DAYS Performed at [facility], [place] [address] [place], [place] [address]    Report Status 12/12/2017 FINAL  Final  MRSA PCR Screening     Status: Abnormal   Collection Time: 12/07/17  6:04 AM  Result Value Ref Range Status   MRSA by PCR POSITIVE (A) NEGATIVE Final    Comment:        The GeneXpert MRSA Assay (FDA approved for NASAL specimens only), is one component of a comprehensive MRSA colonization surveillance program. It is not intended to diagnose MRSA infection nor to guide or monitor treatment for MRSA infections. CRITICAL RESULT CALLED TO, READ BACK BY AND VERIFIED WITH: RN  C RICE 12/07/17 AT 815 BY CM Performed at [facility], [place] [address], [place], [place] [address]     Radiology Reports Ct Head Wo Contrast  Result Date: 12/07/2017 CLINICAL DATA:  Found down at side of bed, cardioverted. History of hypertension, hyperlipidemia. EXAM: CT HEAD WITHOUT CONTRAST CT CERVICAL SPINE WITHOUT CONTRAST TECHNIQUE: Multidetector CT imaging of the head and cervical spine was performed following the standard protocol without intravenous contrast. Multiplanar CT image reconstructions of the cervical spine were also generated. COMPARISON:  CT HEAD June 01, 2014 FINDINGS: CT HEAD FINDINGS BRAIN: No intraparenchymal hemorrhage, mass effect nor midline shift. Moderate parenchymal brain volume loss. No hydrocephalus. Patchy supratentorial white matter hypodensities. No acute large vascular territory infarcts. No abnormal extra-axial fluid collections. Basal cisterns are patent. VASCULAR: Moderate calcific atherosclerosis of the carotid siphons. SKULL: No skull fracture. No significant scalp soft tissue swelling. Focal calcified parietal convexity tricholemmal cyst. SINUSES/ORBITS: Mild-to-moderate paranasal sinus mucosal thickening. Mastoid air cells are well aerated.The included ocular globes and orbital contents are  non-suspicious. OTHER: Life-support lines in place. CT CERVICAL SPINE FINDINGS ALIGNMENT: Straightened lordosis.  Vertebral bodies in alignment. SKULL BASE AND VERTEBRAE: Cervical vertebral bodies and posterior elements are intact. Moderate to severe C3-4 through C6-7 disc height loss with endplate spurring compatible with degenerative discs, moderate C2-3. C1-2 articulation maintained with moderate arthropathy. No destructive bony lesions. SOFT TISSUES AND SPINAL CANAL: Nonacute. Mild calcific atherosclerosis carotid bifurcations. DISC LEVELS: Severe LEFT C3-4, RIGHT C4-5 neural foraminal narrowing. Mild canal stenosis C6-7. UPPER CHEST: Lung apices are clear. OTHER: Life-support lines in place. IMPRESSION: CT HEAD: 1. No acute intracranial process. 2. Moderate parenchymal brain volume loss. 3. Moderate chronic small vessel ischemic changes. CT CERVICAL SPINE: 1. No fracture or malalignment. Electronically Signed   By: Elon Alas M.D.   On: 12/07/2017 03:20   Ct Cervical Spine Wo Contrast  Result Date: 12/07/2017 CLINICAL DATA:  Found down at side of bed, cardioverted. History of hypertension, hyperlipidemia. EXAM: CT HEAD WITHOUT CONTRAST CT CERVICAL SPINE WITHOUT CONTRAST TECHNIQUE: Multidetector CT imaging of the head and cervical spine was performed following the standard protocol without intravenous contrast. Multiplanar CT image reconstructions of the cervical spine were also generated. COMPARISON:  CT HEAD June 01, 2014 FINDINGS: CT HEAD FINDINGS BRAIN: No intraparenchymal hemorrhage, mass effect nor midline shift. Moderate parenchymal brain volume loss. No hydrocephalus. Patchy supratentorial white matter hypodensities. No acute large vascular territory infarcts. No abnormal extra-axial fluid collections. Basal cisterns are patent. VASCULAR: Moderate calcific atherosclerosis of the carotid siphons. SKULL: No skull fracture. No significant scalp soft tissue swelling. Focal calcified parietal  convexity tricholemmal cyst. SINUSES/ORBITS: Mild-to-moderate paranasal sinus mucosal thickening. Mastoid air cells are well aerated.The included ocular globes and orbital contents are non-suspicious. OTHER: Life-support lines in place. CT CERVICAL SPINE FINDINGS ALIGNMENT: Straightened lordosis.  Vertebral bodies in alignment. SKULL BASE AND VERTEBRAE: Cervical vertebral bodies and posterior elements are intact. Moderate to severe C3-4 through C6-7 disc height loss with endplate spurring compatible with degenerative discs, moderate C2-3. C1-2 articulation maintained with moderate arthropathy. No destructive bony lesions. SOFT TISSUES AND SPINAL CANAL: Nonacute. Mild calcific atherosclerosis carotid bifurcations. DISC LEVELS: Severe LEFT C3-4, RIGHT C4-5 neural foraminal narrowing. Mild canal stenosis C6-7. UPPER CHEST: Lung apices are clear. OTHER: Life-support lines in place. IMPRESSION: CT HEAD: 1. No acute intracranial process. 2. Moderate parenchymal brain volume loss. 3. Moderate chronic small vessel ischemic changes. CT CERVICAL SPINE: 1. No fracture or malalignment. Electronically Signed   By:  Elon Alas M.D.   On: 12/07/2017 03:20   Dg Chest Port 1 View  Result Date: 12/08/2017 CLINICAL DATA:  Respiratory failure, endotracheal tube present. EXAM: PORTABLE CHEST 1 VIEW COMPARISON:  Radiograph December 07, 2017. FINDINGS: Stable cardiomegaly. Endotracheal and nasogastric tubes are unchanged in position. Right internal jugular dialysis catheter is unchanged. Stable mild right basilar subsegmental atelectasis is noted. Stable left basilar opacity is noted concerning for atelectasis with associated pleural effusion. No pneumothorax is noted. Bony thorax is unremarkable. IMPRESSION: Stable support apparatus. Stable bibasilar opacities, left greater than right, as described above. Electronically Signed   By: Marijo Conception, M.D.   On: 12/08/2017 08:51   Dg Chest Port 1 View  Result Date:  12/07/2017 CLINICAL DATA:  Intubation. EXAM: PORTABLE CHEST 1 VIEW COMPARISON:  Chest radiograph Oct 23, 2017 FINDINGS: Endotracheal tube tip projects 16 mm above the carina. Nasogastric tube looped in proximal stomach, distal tip projecting at GE junction. Multiple EKG lines overlie the patient and may obscure subtle underlying pathology. Tunneled dialysis catheter via RIGHT internal jugular venous approach with distal tip projecting in RIGHT atrium. Cardiomegaly and pulmonary vascular congestion. Low inspiratory examination with crowded vascular markings. Potential LEFT pleural effusion. RIGHT costophrenic angle out of field of view. No pneumothorax. Soft tissue planes and included osseous structures are non suspicious. IMPRESSION: Endotracheal tube tip projects 16 mm above the carina. Nasogastric tube looped in proximal stomach, distal tip projecting at GE junction. Cardiomegaly and pulmonary vascular congestion. Electronically Signed   By: Elon Alas M.D.   On: 12/07/2017 02:24     CBC Recent Labs  Lab 12/09/17 0307 12/10/17 0620 12/11/17 0442 12/12/17 0650 12/13/17 0351 12/14/17 0622 12/15/17 0513  WBC 7.2 6.9 7.1 5.8 6.2 6.1 4.3  HGB 9.1* 9.2* 9.6* 9.1* 9.5* 9.4* 9.0*  HCT 31.6* 30.8* 31.9* 30.0* 31.1* 30.8* 30.0*  PLT 133* 126* 172 141* 157 145* PLATELET CLUMPS NOTED ON SMEAR, COUNT APPEARS DECREASED  MCV 91.9 87.5 86.9 86.7 87.1 87.3 89.6  MCH 26.5 26.1 26.2 26.3 26.6 26.6 26.9  MCHC 28.8* 29.9* 30.1 30.3 30.5 30.5 30.0  RDW 19.6* 19.3* 19.1* 19.1* 19.5* 20.0* 20.1*  LYMPHSABS 0.9 0.7  --   --   --   --   --   MONOABS 1.1* 1.0  --   --   --   --   --   EOSABS 0.0 0.1  --   --   --   --   --   BASOSABS 0.1 0.0  --   --   --   --   --     Chemistries  Recent Labs  Lab 12/09/17 0509 12/10/17 0620 12/11/17 0442 12/12/17 0650 12/14/17 1058 12/15/17 0513  NA  --  134* 134* 132* 133* 134*  K  --  3.6 3.9 3.8 4.4 3.5  CL  --  99 97* 96* 96* 96*  CO2  --  24 23 23 24 27    GLUCOSE  --  82 89 89 94 81  BUN  --  22 32* 41* 32* 13  CREATININE  --  3.77* 4.91* 5.92* 5.34* 3.30*  CALCIUM  --  8.6* 8.7* 8.6* 8.6* 8.7*  MG 2.1  --   --   --  1.6* 1.7   ------------------------------------------------------------------------------------------------------------------ No results for input(s): CHOL, HDL, LDLCALC, TRIG, CHOLHDL, LDLDIRECT in the last 72 hours.  Lab Results  Component Value Date   HGBA1C 5.7 (H) 09/23/2016   ------------------------------------------------------------------------------------------------------------------ No results  for input(s): TSH, T4TOTAL, T3FREE, THYROIDAB in the last 72 hours.  Invalid input(s): FREET3 ------------------------------------------------------------------------------------------------------------------ No results for input(s): VITAMINB12, FOLATE, FERRITIN, TIBC, IRON, RETICCTPCT in the last 72 hours.  Coagulation profile Recent Labs  Lab 12/12/17 0650 12/12/17 1327 12/13/17 0351 12/14/17 0622 12/15/17 0513  INR 1.86 1.84 1.78 1.53 1.59    No results for input(s): DDIMER in the last 72 hours.  Cardiac Enzymes No results for input(s): CKMB, TROPONINI, MYOGLOBIN in the last 168 hours.  Invalid input(s): CK ------------------------------------------------------------------------------------------------------------------    Component Value Date/Time   BNP 487.5 (H) 05/23/2017 1332    Roxan Hockey M.D on 12/15/2017 at 3:39 PM   Go to [email or web address] - password [email or web address] for contact info  [facility] - Office  [phone]

## 2017-12-15 NOTE — Progress Notes (Signed)
[facility] Progress Note   Subjective:  Seen in room, eating breakfast. No new complaints, still with mild SOB at times. Remains on heparin drip. No CP.  Objective Vitals:   12/14/17 2323 12/15/17 0402 12/15/17 0403 12/15/17 0731  BP: 103/72 107/63  119/78  Pulse:  72  (!) 59  Resp: (!) 24   20  Temp: (!) 97.4 F (36.3 C) 97.8 F (36.6 C)  97.6 F (36.4 C)  TempSrc: Axillary Oral  Oral  SpO2: 100% 99%    Weight:   82 kg (180 lb 12.4 oz)   Height:       Physical Exam General: Well appearing man, NAD Heart: RRR; 2/6 systolic murmur Lungs: CTAB Extremities: 1+ pitting LE edema Dialysis Access: TDC in R chest  Additional Objective Labs: Basic Metabolic Panel: Recent Labs  Lab 12/12/17 0650 12/14/17 1058 12/15/17 0513  NA 132* 133* 134*  K 3.8 4.4 3.5  CL 96* 96* 96*  CO2 23 24 27   GLUCOSE 89 94 81  BUN 41* 32* 13  CREATININE 5.92* 5.34* 3.30*  CALCIUM 8.6* 8.6* 8.7*   CBC: Recent Labs  Lab 12/09/17 0307 12/10/17 0620 12/11/17 0442 12/12/17 0650 12/13/17 0351 12/14/17 0622 12/15/17 0513  WBC 7.2 6.9 7.1 5.8 6.2 6.1 4.3  NEUTROABS 5.1 5.1  --   --   --   --   --   HGB 9.1* 9.2* 9.6* 9.1* 9.5* 9.4* 9.0*  HCT 31.6* 30.8* 31.9* 30.0* 31.1* 30.8* 30.0*  MCV 91.9 87.5 86.9 86.7 87.1 87.3 89.6  PLT 133* 126* 172 141* 157 145* PLATELET CLUMPS NOTED ON SMEAR, COUNT APPEARS DECREASED   CBG: Recent Labs  Lab 12/14/17 1830 12/14/17 2033 12/14/17 2347 12/15/17 0359 12/15/17 0729  GLUCAP 84 86 72 63* 71   Medications: . sodium chloride    . dextrose Stopped (12/13/17 1257)  . heparin 1,850 Units/hr (12/15/17 0400)   . Chlorhexidine Gluconate Cloth  6 each Topical Q0600  . darbepoetin (ARANESP) injection - DIALYSIS  60 mcg Intravenous Q Thu-HD  . famotidine  20 mg Oral Daily  . insulin aspart  0-9 Units Subcutaneous TID WC  . mexiletine  150 mg Oral Q12H  . ranolazine  500 mg Oral BID  . sodium chloride flush  3 mL Intravenous Q12H  .  traZODone  50 mg Oral QHS  . warfarin  4 mg Oral ONCE-1800  . Warfarin - Pharmacist Dosing Inpatient   Does not apply q1800    Dialysis Orders: [facility] (recent start) 4h 400/800 79kg 3K/ 2Ca bath Hep 2700 R IJ TDC - mircera 75 every 2 wks last 6/27  Home meds: - coumadin 2mg  hs/ lipitor 10 mg hs - wellbutrin 100mg  qd/ baclofen 5 mg tid prn/ percocet prn/ desyrel 50 hs - protonix 40 qd/ sl ntg prn/ mvi/ allopurinol 300 qd   Assessment/Plan: 1.VTach / witnessed arrest - Cardio following - on amiodarone.LHC showed no significant change to explain VT.  EP consulted and decided on conservative management. Per cardio 2. Confusion - per pt chronic issue. Seems improved today. 3. A. Fib - on coumadin at home. Holding BB due to bradycardia. Per cardio 4. CHF- EF reduced from 30% > 15-20%. Per cardio 5. ESRD -HD TTS - recent start.Next HD 7/20.   6. Anemia of CKD-Hgb 9.0. Continue aranesp 48mcg qwk (thurs) while admitted.  7. Secondary hyperparathyroidism -Ca 8.7,last phos 5.2. Not on VDRA or binders.  8. HTN/volume -BP ok, holding BB.LE edemaimproved but  still presenton exam, new to HD still establishing EDW, continue to titrate down as tolerated, should be able to meet. 9. Nutrition -Renal diet with fluid restrictions.Renavite. 10. Hx colon cancer 11. Hx depression 12. Hx gout    Veneta Penton, Hershal Coria 12/15/2017, 9:29 AM  [facility] Pager: [phone]

## 2017-12-16 ENCOUNTER — Inpatient Hospital Stay (HOSPITAL_COMMUNITY): Payer: Medicare Other

## 2017-12-16 DIAGNOSIS — E440 Moderate protein-calorie malnutrition: Secondary | ICD-10-CM

## 2017-12-16 LAB — BASIC METABOLIC PANEL
Anion gap: 12 (ref 5–15)
BUN: 23 mg/dL (ref 8–23)
CHLORIDE: 96 mmol/L — AB (ref 98–111)
CO2: 27 mmol/L (ref 22–32)
Calcium: 8.9 mg/dL (ref 8.9–10.3)
Creatinine, Ser: 4.27 mg/dL — ABNORMAL HIGH (ref 0.61–1.24)
GFR calc Af Amer: 15 mL/min — ABNORMAL LOW (ref >60)
GFR, EST NON AFRICAN AMERICAN: 13 mL/min — AB (ref >60)
Glucose, Bld: 73 mg/dL (ref 70–99)
POTASSIUM: 3.7 mmol/L (ref 3.5–5.1)
Sodium: 135 mmol/L (ref 135–145)

## 2017-12-16 LAB — PROTIME-INR
INR: 1.6
Prothrombin Time: 18.9 seconds — ABNORMAL HIGH (ref 11.4–15.2)

## 2017-12-16 LAB — CBC WITH DIFFERENTIAL/PLATELET
ABS IMMATURE GRANULOCYTES: 0.1 10*3/uL (ref 0.0–0.1)
Basophils Absolute: 0 10*3/uL (ref 0.0–0.1)
Basophils Relative: 0 %
Eosinophils Absolute: 0.1 10*3/uL (ref 0.0–0.7)
Eosinophils Relative: 1 %
HEMATOCRIT: 30.5 % — AB (ref 39.0–52.0)
Hemoglobin: 9.2 g/dL — ABNORMAL LOW (ref 13.0–17.0)
IMMATURE GRANULOCYTES: 1 %
LYMPHS ABS: 0.6 10*3/uL — AB (ref 0.7–4.0)
Lymphocytes Relative: 9 %
MCH: 26.7 pg (ref 26.0–34.0)
MCHC: 30.2 g/dL (ref 30.0–36.0)
MCV: 88.4 fL (ref 78.0–100.0)
MONOS PCT: 14 %
Monocytes Absolute: 1 10*3/uL (ref 0.1–1.0)
NEUTROS ABS: 5 10*3/uL (ref 1.7–7.7)
NEUTROS PCT: 75 %
PLATELETS: 122 10*3/uL — AB (ref 150–400)
RBC: 3.45 MIL/uL — ABNORMAL LOW (ref 4.22–5.81)
RDW: 20 % — ABNORMAL HIGH (ref 11.5–15.5)
WBC: 6.7 10*3/uL (ref 4.0–10.5)

## 2017-12-16 LAB — MAGNESIUM: Magnesium: 1.7 mg/dL (ref 1.7–2.4)

## 2017-12-16 LAB — GLUCOSE, CAPILLARY
GLUCOSE-CAPILLARY: 87 mg/dL (ref 70–99)
Glucose-Capillary: 117 mg/dL — ABNORMAL HIGH (ref 70–99)
Glucose-Capillary: 82 mg/dL (ref 70–99)
Glucose-Capillary: 87 mg/dL (ref 70–99)
Glucose-Capillary: 85 mg/dL (ref 70–99)

## 2017-12-16 MED ORDER — HEPARIN (PORCINE) IN NACL 100-0.45 UNIT/ML-% IJ SOLN
1850.0000 [IU]/h | INTRAMUSCULAR | Status: DC
  Administered 2017-12-16 (×2): 1850 [IU]/h via INTRAVENOUS
  Filled 2017-12-16: qty 250

## 2017-12-16 MED ORDER — WARFARIN SODIUM 4 MG PO TABS
4.0000 mg | ORAL_TABLET | Freq: Once | ORAL | Status: AC
  Administered 2017-12-16: 4 mg via ORAL
  Filled 2017-12-16: qty 1

## 2017-12-16 NOTE — Progress Notes (Signed)
Patient started bleeding from cath site (right groin) area, gauze was very saturated wit bright red blood.  Pressure was placed in the are area to stop bleeding, a total of 30 minutes it took to stop the blood.  Dr. Kennon Holter was notified of patient's conditions. Patient was placed on O2 due to intercostal breathing, heparin gtt was turned off, and new dressing was placed in right groin area.  Vitals are stable and I will keep monitoring patient.

## 2017-12-16 NOTE — Progress Notes (Signed)
Placed patient on O2/2L while feeding him due to his short of breath.  Sat levels at 94%.  Patient had a Nephro earlier in the shift but has not eaten a full meal to this point.  CBGs in the 80s through out the day and tonight at 18 at [place].  I will keep monitoring patient.

## 2017-12-16 NOTE — Progress Notes (Signed)
Hemodialysis canceled for today by Dr Joelyn Oms rescheduled for 12/17/2017. Patients Primary RN Levada Dy Notified of schedule change at 1742.

## 2017-12-16 NOTE — Progress Notes (Addendum)
[facility] Progress Note   Subjective: Up in chair, says his breathing is "OK right now". Denies chest pain. Oriented to person and place.   Objective Vitals:   12/15/17 2114 12/16/17 0016 12/16/17 0340 12/16/17 0739  BP: 127/78 117/70 110/89 104/73  Pulse: 74 76 84 79  Resp: (!) 22 16 (!) 24 18  Temp: 98 F (36.7 C) 98.1 F (36.7 C)  98 F (36.7 C)  TempSrc: Oral Oral  Oral  SpO2: 99% 99% 100% 100%  Weight:      Height:       Physical Exam General: Chronically ill appearing male in NAD Heart: Q7,M2 2/6 systolic M +S4 Lungs: decreased L base, few  Bibasilar crackles. No WOB. Abdomen: Active BS. Colostomy present Extremities: 1+ pitting BLE edema Dialysis Access: RIJ TDC drsg CDI.    Additional Objective Labs: Basic Metabolic Panel: Recent Labs  Lab 12/14/17 1058 12/15/17 0513 12/16/17 0442  NA 133* 134* 135  K 4.4 3.5 3.7  CL 96* 96* 96*  CO2 24 27 27   GLUCOSE 94 81 73  BUN 32* 13 23  CREATININE 5.34* 3.30* 4.27*  CALCIUM 8.6* 8.7* 8.9   Liver Function Tests: No results for input(s): AST, ALT, ALKPHOS, BILITOT, PROT, ALBUMIN in the last 168 hours. No results for input(s): LIPASE, AMYLASE in the last 168 hours. CBC: Recent Labs  Lab 12/10/17 0620  12/12/17 0650 12/13/17 0351 12/14/17 0622 12/15/17 0513 12/16/17 0442  WBC 6.9   < > 5.8 6.2 6.1 4.3 6.7  NEUTROABS 5.1  --   --   --   --   --  5.0  HGB 9.2*   < > 9.1* 9.5* 9.4* 9.0* 9.2*  HCT 30.8*   < > 30.0* 31.1* 30.8* 30.0* 30.5*  MCV 87.5   < > 86.7 87.1 87.3 89.6 88.4  PLT 126*   < > 141* 157 145* PLATELET CLUMPS NOTED ON SMEAR, COUNT APPEARS DECREASED 122*   < > = values in this interval not displayed.   Blood Culture    Component Value Date/Time   SDES BLOOD LEFT HAND 12/07/2017 0418   SPECREQUEST  12/07/2017 0418    BOTTLES DRAWN AEROBIC AND ANAEROBIC Blood Culture adequate volume   CULT  12/07/2017 0418    NO GROWTH 5 DAYS Performed at [facility], [place]  [address], [place], [place] [address]    REPTSTATUS 12/12/2017 FINAL 12/07/2017 0418    Cardiac Enzymes: No results for input(s): CKTOTAL, CKMB, CKMBINDEX, TROPONINI in the last 168 hours. CBG: Recent Labs  Lab 12/14/17 2347 12/15/17 0359 12/15/17 0729 12/15/17 1153 12/15/17 1659  GLUCAP 72 63* 71 73 126*   Iron Studies: No results for input(s): IRON, TIBC, TRANSFERRIN, FERRITIN in the last 72 hours. @lablastinr3 @ Studies/Results: Dg Chest Port 1 View  Result Date: 12/16/2017 CLINICAL DATA:  67 year old male with shortness of breath. EXAM: PORTABLE CHEST 1 VIEW COMPARISON:  Chest radiograph dated 12/08/2017 FINDINGS: Right-sided dialysis catheter in similar position. There is shallow inspiration with bibasilar atelectasis. No focal consolidation, pleural effusion, or pneumothorax. There is stable cardiomegaly. No significant vascular congestion or edema. No acute osseous pathology. IMPRESSION: Shallow inspiration with bibasilar atelectasis. No focal consolidation. Cardiomegaly. Electronically Signed   By: Anner Crete M.D.   On: 12/16/2017 05:07   Medications: . sodium chloride     . Chlorhexidine Gluconate Cloth  6 each Topical Q0600  . darbepoetin (ARANESP) injection - DIALYSIS  60 mcg Intravenous Q Thu-HD  . famotidine  20 mg Oral Daily  . feeding supplement (NEPRO CARB STEADY)  237 mL Oral TID BM  . mexiletine  150 mg Oral Q12H  . mirtazapine  7.5 mg Oral QHS  . ranolazine  500 mg Oral BID  . sodium chloride flush  3 mL Intravenous Q12H  . Warfarin - Pharmacist Dosing Inpatient   Does not apply q1800     Dialysis Orders: [facility] (recent start) 4h 400/800 79kg 3K/ 2Ca bath Hep 2700 R IJ TDC - mircera 75 every 2 wks last 6/27  Home meds: - coumadin 2mg  hs/ lipitor 10 mg hs - wellbutrin 100mg  qd/ baclofen 5 mg tid prn/ percocet prn/ desyrel 50 hs - protonix 40 qd/ sl ntg prn/ mvi/ allopurinol 300 qd   Assessment/Plan: 1.VTach / witnessed arrest -  Cardio following/EP consulted - BB and amiodarone DC'd. Conservative management .LHCshowed no significant change to explain VT.? Accelerated JR with  BBB on monitor.  2.Confusion - per pt chronic issue. Oriented to person and place today.  3. H/OA. Fib - on coumadinat home.Holding BB due to bradycardia. Per cardio 4. CHF- EF reduced from 30% > 15-20%. Per cardio 5. ESRD -HD TTS - recent start.HD today. K+ 3.7 Use 4.0 K bath.  6. Anemia of CKD-Hgb 9.2. Continue aranesp 26mcg qwk (thurs) while admitted. 7. Secondary hyperparathyroidism -Ca 8.7,last phos 5.2. Not on VDRA or binders. 8. HTN/volume -BPok, holding BB.LE edemaimproved but still presenton exam, new to HD still establishing EDW, continue to titrate down as tolerated, should be able to meet. 9. Nutrition -Renal diet with fluid restrictions.Renavite. 10. Hx colon cancer 11. Hx depression 12. Hx gout   Patrick Caba H. Cheridan Kibler NP-C 12/16/2017, 8:49 AM  [employer] [phone]

## 2017-12-16 NOTE — Care Management Note (Signed)
Case Management Note  Patient Details  Name: Patrick [name] MRN: [id number] Date of Birth: 1951-05-05  Subjective/Objective:  Pt presented for AMS- ESRD on HD TTS. VT- Intubated-Extubated. LHC 12-13-17. Pt is from long term care at [facility] will be to return once stable.                    Action/Plan: CSW following for disposition needs. CM will continue to monitor.   Expected Discharge Date:                  Expected Discharge Plan:  Skilled Nursing Facility  In-House Referral:  Clinical Social Work  Discharge planning Services  CM Consult  Post Acute Care Choice:  NA Choice offered to:  NA  DME Arranged:  N/A DME Agency:  NA  HH Arranged:  NA HH Agency:  NA  Status of Service:  Completed, signed off  If discussed at [place] of Stay Meetings, dates discussed:    Additional Comments:  Bethena Roys, RN 12/16/2017, 2:21 PM

## 2017-12-16 NOTE — Progress Notes (Signed)
Patient Demographics:    Mauricio Dahlen, is a 67 y.o. male, DOB - Oct 09, 1950, FVC:[id number]  Admit date - 12/07/2017   Admitting Physician Reyne Dumas, MD  Outpatient Primary MD for the patient is Patient, [name]  LOS - 9   Chief Complaint  Patient presents with  . Altered Mental Status        Subjective:    Dominico Rod today has no fevers, no emesis,  No chest pain, blood sugars remain borderline low, no further bleeding from right groin prior catheterization site, IV heparin was held overnight  Assessment  & Plan :    Active Problems:   Acute on chronic combined systolic and diastolic CHF (congestive heart failure) (HCC)   Acute respiratory failure with hypoxia (HCC)   VT (ventricular tachycardia) (HCC)   Respiratory arrest (HCC)   Malnutrition of moderate degree  Brief Summary 67 y.o.malewith a hx of subtotal occlusion of the small codominantRCA and 70% OMtreated medically, S-D-CHF, HTN, HLD, borderline DM, SBO 2009, BPH, gout, OA. 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 tach subsequently intubated on 2019 the ED extubated on 12/08/2017, transferred to hospitalist service on 12/10/2017.  Left Heart catheterization on 12/13/2017 with obstructive CAD unchanged from September 2017   Plan:- 1)S/p VTach Arrest--stable at this time, cardiology input appreciated,  left heart catheterization on 12/13/2017 noted, medical management advised, continue aspirin and amiodarone as well as metoprolol  2)ESRD--last hemodialysis session on 12/16/2017 nephrology input appreciated  3)Coronary Artery Disease-no chest pains , no on-going ACS type symptoms, patient has subtotally occluded codominant RCA,, cardiology  input appreciated, Left Heart catheterization on 12/13/2017 with obstructive CAD unchanged from September 2017, There is no change in coronary anatomy compared to September 2017. His cardiomyopathy is out of proportion to his CAD, c/n  Coumadin, Restart  IV heparin bridge as there is no further bleeding from right groin catheterization site.  C/n Coumadin ,  INR is 1.6, c/n aspirin and metoprolol as ordered  4)Persistent atrial fibrillation--stable at this time, restart IV heparin bridge as above #2 continue Coumadin, amiodarone as ordered, continue metoprolol 12.5 mg twice daily for rate control  5)HFrEF/ ischemic cardiomyopathy/systolic dysfunction CHF--EF 15 to 20%, cardiology input appreciated,  6)DM2-stable, last known A1c 5.7, Use Novolog/Humalog Sliding scale insulin with Accu-Cheks/Fingersticks as ordered   7)Anemia of CKD--- ASA/EPO agent as per nephrology service, hemoglobin is stable above 9 at this time,  8) anorexia,/poor appetite/hypoglycemia--- give nephro supplements, encourage increased oral intake, continue Remeron for appetite stimulation  Code Status : full   Disposition Plan  : Discharge when INR is  Therapeutic, if no significant hypoglycemia Consults  : Nephrology/cardiology   DVT Prophylaxis  : IV heparin drip subsequently will be back on Coumadin Lab Results  Component Value Date   PLT 122 (L) 12/16/2017    Inpatient Medications  Scheduled Meds: . Chlorhexidine Gluconate Cloth  6 each Topical Q0600  . darbepoetin (ARANESP) injection - DIALYSIS  60 mcg Intravenous Q Thu-HD  . famotidine  20 mg Oral Daily  . feeding supplement (NEPRO CARB STEADY)  237 mL Oral TID BM  . mexiletine  150 mg Oral Q12H  . mirtazapine  7.5 mg Oral QHS  . ranolazine  500 mg Oral BID  . sodium chloride flush  3 mL Intravenous Q12H  . Warfarin - Pharmacist Dosing Inpatient   Does not apply q1800   Continuous Infusions: . sodium chloride     PRN Meds:.sodium chloride,  acetaminophen, docusate, ondansetron (ZOFRAN) IV, sodium chloride flush    Anti-infectives (From admission, onward)   None        Objective:   Vitals:   12/16/17 0016 12/16/17 0340 12/16/17 0739 12/16/17 1254  BP: 117/70 110/89 104/73 108/72  Pulse: 76 84 79 72  Resp: 16 (!) 24 18 18   Temp: 98.1 F (36.7 C)  98 F (36.7 C) (!) 97.5 F (36.4 C)  TempSrc: Oral  Oral Oral  SpO2: 99% 100% 100%   Weight:      Height:        Wt Readings from Last 3 Encounters:  12/15/17 82 kg (180 lb 12.4 oz)  10/27/17 82.6 kg (182 lb 1.6 oz)  08/29/17 97.5 kg (215 lb)     Intake/Output Summary (Last 24 hours) at 12/16/2017 1428 Last data filed at 12/16/2017 1200 Gross per 24 hour  Intake 1340.17 ml  Output 275 ml  Net 1065.17 ml     Physical Exam  Gen:- Awake Alert,  In no apparent distress  HEENT:- [place].AT, No sclera icterus Neck-Supple Neck,.  Right IJ hemodialysis catheter site is clean dry and intact Lungs-  CTAB , good air movement CV- S1, S2 normal Abd-  +ve B.Sounds, Abd Soft, No tenderness, colostomy bag with fecal contents Extremity/Skin:- No  edema,   good pulses, no further bleeding or hematoma from right groin prior catheterization site Psych-affect is appropriate, oriented x3 Neuro-no new focal deficits, no tremors GU-Foley catheter  Data Review:   Micro Results Recent Results (from the past 240 hour(s))  Culture, blood (routine x 2)     Status: None   Collection Time: 12/07/17  4:00 AM  Result Value Ref Range Status   Specimen Description BLOOD RIGHT HAND  Final   Special Requests   Final    BOTTLES DRAWN AEROBIC AND ANAEROBIC Blood Culture adequate volume   Culture   Final    NO GROWTH 5 DAYS Performed at [facility], [address], [place], [place] [address]    Report Status 12/12/2017 FINAL  Final  Culture, blood (routine x 2)     Status: None   Collection Time: 12/07/17  4:18 AM  Result Value Ref Range Status   Specimen Description BLOOD LEFT  HAND  Final   Special Requests   Final    BOTTLES DRAWN AEROBIC AND ANAEROBIC Blood Culture adequate volume   Culture   Final    NO GROWTH 5 DAYS Performed at [facility], [place] [address], [place], [place] [address]    Report Status 12/12/2017 FINAL  Final  MRSA PCR Screening     Status: Abnormal   Collection Time: 12/07/17  6:04 AM  Result Value Ref Range Status   MRSA by PCR POSITIVE (A) NEGATIVE Final    Comment:        The GeneXpert MRSA Assay (FDA approved for NASAL specimens only), is one component of a comprehensive MRSA colonization surveillance program. It is not intended to diagnose MRSA infection nor to guide or monitor treatment for MRSA infections. CRITICAL RESULT CALLED TO, READ BACK  BY AND VERIFIED WITH: RN C RICE 12/07/17 AT 815 BY CM Performed at [facility], [place] [address] [place], [place] [address]     Radiology Reports Ct Head Wo Contrast  Result Date: 12/07/2017 CLINICAL DATA:  Found down at side of bed, cardioverted. History of hypertension, hyperlipidemia. EXAM: CT HEAD WITHOUT CONTRAST CT CERVICAL SPINE WITHOUT CONTRAST TECHNIQUE: Multidetector CT imaging of the head and cervical spine was performed following the standard protocol without intravenous contrast. Multiplanar CT image reconstructions of the cervical spine were also generated. COMPARISON:  CT HEAD June 01, 2014 FINDINGS: CT HEAD FINDINGS BRAIN: No intraparenchymal hemorrhage, mass effect nor midline shift. Moderate parenchymal brain volume loss. No hydrocephalus. Patchy supratentorial white matter hypodensities. No acute large vascular territory infarcts. No abnormal extra-axial fluid collections. Basal cisterns are patent. VASCULAR: Moderate calcific atherosclerosis of the carotid siphons. SKULL: No skull fracture. No significant scalp soft tissue swelling. Focal calcified parietal convexity tricholemmal cyst. SINUSES/ORBITS: Mild-to-moderate paranasal sinus mucosal thickening.  Mastoid air cells are well aerated.The included ocular globes and orbital contents are non-suspicious. OTHER: Life-support lines in place. CT CERVICAL SPINE FINDINGS ALIGNMENT: Straightened lordosis.  Vertebral bodies in alignment. SKULL BASE AND VERTEBRAE: Cervical vertebral bodies and posterior elements are intact. Moderate to severe C3-4 through C6-7 disc height loss with endplate spurring compatible with degenerative discs, moderate C2-3. C1-2 articulation maintained with moderate arthropathy. No destructive bony lesions. SOFT TISSUES AND SPINAL CANAL: Nonacute. Mild calcific atherosclerosis carotid bifurcations. DISC LEVELS: Severe LEFT C3-4, RIGHT C4-5 neural foraminal narrowing. Mild canal stenosis C6-7. UPPER CHEST: Lung apices are clear. OTHER: Life-support lines in place. IMPRESSION: CT HEAD: 1. No acute intracranial process. 2. Moderate parenchymal brain volume loss. 3. Moderate chronic small vessel ischemic changes. CT CERVICAL SPINE: 1. No fracture or malalignment. Electronically Signed   By: Elon Alas M.D.   On: 12/07/2017 03:20   Ct Cervical Spine Wo Contrast  Result Date: 12/07/2017 CLINICAL DATA:  Found down at side of bed, cardioverted. History of hypertension, hyperlipidemia. EXAM: CT HEAD WITHOUT CONTRAST CT CERVICAL SPINE WITHOUT CONTRAST TECHNIQUE: Multidetector CT imaging of the head and cervical spine was performed following the standard protocol without intravenous contrast. Multiplanar CT image reconstructions of the cervical spine were also generated. COMPARISON:  CT HEAD June 01, 2014 FINDINGS: CT HEAD FINDINGS BRAIN: No intraparenchymal hemorrhage, mass effect nor midline shift. Moderate parenchymal brain volume loss. No hydrocephalus. Patchy supratentorial white matter hypodensities. No acute large vascular territory infarcts. No abnormal extra-axial fluid collections. Basal cisterns are patent. VASCULAR: Moderate calcific atherosclerosis of the carotid siphons. SKULL: No  skull fracture. No significant scalp soft tissue swelling. Focal calcified parietal convexity tricholemmal cyst. SINUSES/ORBITS: Mild-to-moderate paranasal sinus mucosal thickening. Mastoid air cells are well aerated.The included ocular globes and orbital contents are non-suspicious. OTHER: Life-support lines in place. CT CERVICAL SPINE FINDINGS ALIGNMENT: Straightened lordosis.  Vertebral bodies in alignment. SKULL BASE AND VERTEBRAE: Cervical vertebral bodies and posterior elements are intact. Moderate to severe C3-4 through C6-7 disc height loss with endplate spurring compatible with degenerative discs, moderate C2-3. C1-2 articulation maintained with moderate arthropathy. No destructive bony lesions. SOFT TISSUES AND SPINAL CANAL: Nonacute. Mild calcific atherosclerosis carotid bifurcations. DISC LEVELS: Severe LEFT C3-4, RIGHT C4-5 neural foraminal narrowing. Mild canal stenosis C6-7. UPPER CHEST: Lung apices are clear. OTHER: Life-support lines in place. IMPRESSION: CT HEAD: 1. No acute intracranial process. 2. Moderate parenchymal brain volume loss. 3. Moderate chronic small vessel ischemic changes. CT CERVICAL SPINE: 1. No fracture or malalignment.  Electronically Signed   By: Elon Alas M.D.   On: 12/07/2017 03:20   Dg Chest Port 1 View  Result Date: 12/16/2017 CLINICAL DATA:  67 year old male with shortness of breath. EXAM: PORTABLE CHEST 1 VIEW COMPARISON:  Chest radiograph dated 12/08/2017 FINDINGS: Right-sided dialysis catheter in similar position. There is shallow inspiration with bibasilar atelectasis. No focal consolidation, pleural effusion, or pneumothorax. There is stable cardiomegaly. No significant vascular congestion or edema. No acute osseous pathology. IMPRESSION: Shallow inspiration with bibasilar atelectasis. No focal consolidation. Cardiomegaly. Electronically Signed   By: Anner Crete M.D.   On: 12/16/2017 05:07   Dg Chest Port 1 View  Result Date: 12/08/2017 CLINICAL  DATA:  Respiratory failure, endotracheal tube present. EXAM: PORTABLE CHEST 1 VIEW COMPARISON:  Radiograph December 07, 2017. FINDINGS: Stable cardiomegaly. Endotracheal and nasogastric tubes are unchanged in position. Right internal jugular dialysis catheter is unchanged. Stable mild right basilar subsegmental atelectasis is noted. Stable left basilar opacity is noted concerning for atelectasis with associated pleural effusion. No pneumothorax is noted. Bony thorax is unremarkable. IMPRESSION: Stable support apparatus. Stable bibasilar opacities, left greater than right, as described above. Electronically Signed   By: Marijo Conception, M.D.   On: 12/08/2017 08:51   Dg Chest Port 1 View  Result Date: 12/07/2017 CLINICAL DATA:  Intubation. EXAM: PORTABLE CHEST 1 VIEW COMPARISON:  Chest radiograph Oct 23, 2017 FINDINGS: Endotracheal tube tip projects 16 mm above the carina. Nasogastric tube looped in proximal stomach, distal tip projecting at GE junction. Multiple EKG lines overlie the patient and may obscure subtle underlying pathology. Tunneled dialysis catheter via RIGHT internal jugular venous approach with distal tip projecting in RIGHT atrium. Cardiomegaly and pulmonary vascular congestion. Low inspiratory examination with crowded vascular markings. Potential LEFT pleural effusion. RIGHT costophrenic angle out of field of view. No pneumothorax. Soft tissue planes and included osseous structures are non suspicious. IMPRESSION: Endotracheal tube tip projects 16 mm above the carina. Nasogastric tube looped in proximal stomach, distal tip projecting at GE junction. Cardiomegaly and pulmonary vascular congestion. Electronically Signed   By: Elon Alas M.D.   On: 12/07/2017 02:24     CBC Recent Labs  Lab 12/10/17 0620  12/12/17 0650 12/13/17 0351 12/14/17 0622 12/15/17 0513 12/16/17 0442  WBC 6.9   < > 5.8 6.2 6.1 4.3 6.7  HGB 9.2*   < > 9.1* 9.5* 9.4* 9.0* 9.2*  HCT 30.8*   < > 30.0* 31.1* 30.8*  30.0* 30.5*  PLT 126*   < > 141* 157 145* PLATELET CLUMPS NOTED ON SMEAR, COUNT APPEARS DECREASED 122*  MCV 87.5   < > 86.7 87.1 87.3 89.6 88.4  MCH 26.1   < > 26.3 26.6 26.6 26.9 26.7  MCHC 29.9*   < > 30.3 30.5 30.5 30.0 30.2  RDW 19.3*   < > 19.1* 19.5* 20.0* 20.1* 20.0*  LYMPHSABS 0.7  --   --   --   --   --  0.6*  MONOABS 1.0  --   --   --   --   --  1.0  EOSABS 0.1  --   --   --   --   --  0.1  BASOSABS 0.0  --   --   --   --   --  0.0   < > = values in this interval not displayed.    [facility]  Lab 12/11/17 0442 12/12/17 0650 12/14/17 1058 12/15/17 0513 12/16/17 0442  NA 134*  132* 133* 134* 135  K 3.9 3.8 4.4 3.5 3.7  CL 97* 96* 96* 96* 96*  CO2 23 23 24 27 27   GLUCOSE 89 89 94 81 73  BUN 32* 41* 32* 13 23  CREATININE 4.91* 5.92* 5.34* 3.30* 4.27*  CALCIUM 8.7* 8.6* 8.6* 8.7* 8.9  MG  --   --  1.6* 1.7 1.7   ------------------------------------------------------------------------------------------------------------------ No results for input(s): CHOL, HDL, LDLCALC, TRIG, CHOLHDL, LDLDIRECT in the last 72 hours.  Lab Results  Component Value Date   HGBA1C 5.7 (H) 09/23/2016   ------------------------------------------------------------------------------------------------------------------ No results for input(s): TSH, T4TOTAL, T3FREE, THYROIDAB in the last 72 hours.  Invalid input(s): FREET3 ------------------------------------------------------------------------------------------------------------------ No results for input(s): VITAMINB12, FOLATE, FERRITIN, TIBC, IRON, RETICCTPCT in the last 72 hours.  Coagulation profile Recent Labs  Lab 12/12/17 1327 12/13/17 0351 12/14/17 0622 12/15/17 0513 12/16/17 0442  INR 1.84 1.78 1.53 1.59 1.60    No results for input(s): DDIMER in the last 72 hours.  Cardiac Enzymes No results for input(s): CKMB, TROPONINI, MYOGLOBIN in the last 168 hours.  Invalid input(s):  CK ------------------------------------------------------------------------------------------------------------------    Component Value Date/Time   BNP 487.5 (H) 05/23/2017 1332    Roxan Hockey M.D on 12/16/2017 at 2:28 PM   Go to [email or web address] - password [email or web address] for contact info  [facility] - Office  [phone]

## 2017-12-16 NOTE — Progress Notes (Addendum)
ANTICOAGULATION CONSULT NOTE - Follow Up Consult  Pharmacy Consult for Heparin/Coumadin  Indication: atrial fibrillation  Allergies  Allergen Reactions  . Nsaids     Kidney disease    Patient Measurements: Height: 5\' 7"  (170.2 cm)(measured at bedside by RT) Weight: 180 lb 12.4 oz (82 kg) IBW/kg (Calculated) : 66.1 Heparin Dosing Weight: 80 kg  Vital Signs: Temp: 97.5 F (36.4 C) (07/20 1254) Temp Source: Oral (07/20 1254) BP: 108/72 (07/20 1254) Pulse Rate: 72 (07/20 1254)  Labs: Recent Labs    12/14/17 0622 12/14/17 1058 12/15/17 0513 12/16/17 0442  HGB 9.4*  --  9.0* 9.2*  HCT 30.8*  --  30.0* 30.5*  PLT 145*  --  PLATELET CLUMPS NOTED ON SMEAR, COUNT APPEARS DECREASED 122*  LABPROT 18.2*  --  18.8* 18.9*  INR 1.53  --  1.59 1.60  HEPARINUNFRC 0.35  --  0.38  --   CREATININE  --  5.34* 3.30* 4.27*    Estimated Creatinine Clearance: 16.7 mL/min (A) (by C-G formula based on SCr of 4.27 mg/dL (H)).  Assessment:  67 yo male admitted with VT.  On chronic Coumadin for afib with INR 3.02 on admission 12/07/17.  Last PTA warfarin dose recorded as 12/06/17. PTA warfarin regimen 2mg  daily. S/p cardiac cath 7/17 for which patient received vitamin K 2.5mg .   Pharmacy consulted to resume Coumadin on 7/18. Today INR continues to be subtherapeutic at 1.6 likely still due to vitamin K. Noted that patient recently discontinued from amiodarone.  Heparin gtt turned off early this morning due to bleeding noted from cath site area. Bleeding stopped and pharmacy consulted to restart heparin. Patient previously at the low end of therapeutic on heparin 1850 units/hr  Goal of Therapy:  Heparin level 0.3-0.7 units/ml Monitor platelets by anticoagulation protocol: Yes  INR: 2-3      Plan:  -Restart heparin at 1850 units/hr to bridge until INR therapeutic (No bolus)  -Heparin level 8 hours after resuming gtt -Daily heparin level, PT/INR and CBC. -Continue Coumadin 4mg  today and likely  decrease to PTA regimen tomorrow.   Thank you for involving pharmacy in this patient's care.  Janae Bridgeman, PharmD PGY1 Pharmacy Resident Phone: [phone] 12/16/2017 3:12 PM

## 2017-12-17 DIAGNOSIS — Z66 Do not resuscitate: Secondary | ICD-10-CM | POA: Diagnosis not present

## 2017-12-17 DIAGNOSIS — E440 Moderate protein-calorie malnutrition: Secondary | ICD-10-CM

## 2017-12-17 DIAGNOSIS — R402342 Coma scale, best motor response, flexion withdrawal, at arrival to emergency department: Secondary | ICD-10-CM | POA: Diagnosis not present

## 2017-12-17 DIAGNOSIS — R404 Transient alteration of awareness: Secondary | ICD-10-CM | POA: Diagnosis not present

## 2017-12-17 DIAGNOSIS — I2510 Atherosclerotic heart disease of native coronary artery without angina pectoris: Secondary | ICD-10-CM | POA: Diagnosis not present

## 2017-12-17 DIAGNOSIS — I480 Paroxysmal atrial fibrillation: Secondary | ICD-10-CM | POA: Diagnosis not present

## 2017-12-17 DIAGNOSIS — D696 Thrombocytopenia, unspecified: Secondary | ICD-10-CM | POA: Diagnosis not present

## 2017-12-17 DIAGNOSIS — I4891 Unspecified atrial fibrillation: Secondary | ICD-10-CM | POA: Diagnosis not present

## 2017-12-17 DIAGNOSIS — I5043 Acute on chronic combined systolic (congestive) and diastolic (congestive) heart failure: Secondary | ICD-10-CM | POA: Diagnosis not present

## 2017-12-17 DIAGNOSIS — I5022 Chronic systolic (congestive) heart failure: Secondary | ICD-10-CM | POA: Diagnosis not present

## 2017-12-17 DIAGNOSIS — M19032 Primary osteoarthritis, left wrist: Secondary | ICD-10-CM | POA: Diagnosis not present

## 2017-12-17 DIAGNOSIS — R402122 Coma scale, eyes open, to pain, at arrival to emergency department: Secondary | ICD-10-CM | POA: Diagnosis not present

## 2017-12-17 DIAGNOSIS — N2581 Secondary hyperparathyroidism of renal origin: Secondary | ICD-10-CM | POA: Diagnosis not present

## 2017-12-17 DIAGNOSIS — R4020 Unspecified coma: Secondary | ICD-10-CM | POA: Diagnosis not present

## 2017-12-17 DIAGNOSIS — J9601 Acute respiratory failure with hypoxia: Secondary | ICD-10-CM | POA: Diagnosis not present

## 2017-12-17 DIAGNOSIS — A419 Sepsis, unspecified organism: Secondary | ICD-10-CM | POA: Diagnosis not present

## 2017-12-17 DIAGNOSIS — E161 Other hypoglycemia: Secondary | ICD-10-CM | POA: Diagnosis not present

## 2017-12-17 DIAGNOSIS — G9341 Metabolic encephalopathy: Secondary | ICD-10-CM | POA: Diagnosis not present

## 2017-12-17 DIAGNOSIS — R488 Other symbolic dysfunctions: Secondary | ICD-10-CM | POA: Diagnosis not present

## 2017-12-17 DIAGNOSIS — D6959 Other secondary thrombocytopenia: Secondary | ICD-10-CM | POA: Diagnosis not present

## 2017-12-17 DIAGNOSIS — I120 Hypertensive chronic kidney disease with stage 5 chronic kidney disease or end stage renal disease: Secondary | ICD-10-CM | POA: Diagnosis not present

## 2017-12-17 DIAGNOSIS — I499 Cardiac arrhythmia, unspecified: Secondary | ICD-10-CM | POA: Diagnosis not present

## 2017-12-17 DIAGNOSIS — I429 Cardiomyopathy, unspecified: Secondary | ICD-10-CM | POA: Diagnosis not present

## 2017-12-17 DIAGNOSIS — N183 Chronic kidney disease, stage 3 (moderate): Secondary | ICD-10-CM | POA: Diagnosis not present

## 2017-12-17 DIAGNOSIS — R402222 Coma scale, best verbal response, incomprehensible words, at arrival to emergency department: Secondary | ICD-10-CM | POA: Diagnosis not present

## 2017-12-17 DIAGNOSIS — E872 Acidosis: Secondary | ICD-10-CM | POA: Diagnosis not present

## 2017-12-17 DIAGNOSIS — R2689 Other abnormalities of gait and mobility: Secondary | ICD-10-CM | POA: Diagnosis not present

## 2017-12-17 DIAGNOSIS — R918 Other nonspecific abnormal finding of lung field: Secondary | ICD-10-CM | POA: Diagnosis not present

## 2017-12-17 DIAGNOSIS — E162 Hypoglycemia, unspecified: Secondary | ICD-10-CM | POA: Diagnosis not present

## 2017-12-17 DIAGNOSIS — R1314 Dysphagia, pharyngoesophageal phase: Secondary | ICD-10-CM | POA: Diagnosis not present

## 2017-12-17 DIAGNOSIS — D631 Anemia in chronic kidney disease: Secondary | ICD-10-CM | POA: Diagnosis not present

## 2017-12-17 DIAGNOSIS — M6281 Muscle weakness (generalized): Secondary | ICD-10-CM | POA: Diagnosis not present

## 2017-12-17 DIAGNOSIS — R278 Other lack of coordination: Secondary | ICD-10-CM | POA: Diagnosis not present

## 2017-12-17 DIAGNOSIS — N186 End stage renal disease: Secondary | ICD-10-CM | POA: Diagnosis not present

## 2017-12-17 DIAGNOSIS — I509 Heart failure, unspecified: Secondary | ICD-10-CM | POA: Diagnosis not present

## 2017-12-17 DIAGNOSIS — I4510 Unspecified right bundle-branch block: Secondary | ICD-10-CM | POA: Diagnosis not present

## 2017-12-17 DIAGNOSIS — J9600 Acute respiratory failure, unspecified whether with hypoxia or hypercapnia: Secondary | ICD-10-CM | POA: Diagnosis not present

## 2017-12-17 DIAGNOSIS — R410 Disorientation, unspecified: Secondary | ICD-10-CM | POA: Diagnosis not present

## 2017-12-17 DIAGNOSIS — I959 Hypotension, unspecified: Secondary | ICD-10-CM | POA: Diagnosis not present

## 2017-12-17 DIAGNOSIS — R627 Adult failure to thrive: Secondary | ICD-10-CM | POA: Diagnosis not present

## 2017-12-17 DIAGNOSIS — I472 Ventricular tachycardia: Secondary | ICD-10-CM | POA: Diagnosis not present

## 2017-12-17 DIAGNOSIS — I132 Hypertensive heart and chronic kidney disease with heart failure and with stage 5 chronic kidney disease, or end stage renal disease: Secondary | ICD-10-CM | POA: Diagnosis not present

## 2017-12-17 DIAGNOSIS — R2681 Unsteadiness on feet: Secondary | ICD-10-CM | POA: Diagnosis not present

## 2017-12-17 DIAGNOSIS — R092 Respiratory arrest: Secondary | ICD-10-CM | POA: Diagnosis not present

## 2017-12-17 DIAGNOSIS — M2550 Pain in unspecified joint: Secondary | ICD-10-CM | POA: Diagnosis not present

## 2017-12-17 DIAGNOSIS — M109 Gout, unspecified: Secondary | ICD-10-CM | POA: Diagnosis not present

## 2017-12-17 DIAGNOSIS — J189 Pneumonia, unspecified organism: Secondary | ICD-10-CM | POA: Diagnosis not present

## 2017-12-17 DIAGNOSIS — Z992 Dependence on renal dialysis: Secondary | ICD-10-CM | POA: Diagnosis not present

## 2017-12-17 DIAGNOSIS — F329 Major depressive disorder, single episode, unspecified: Secondary | ICD-10-CM | POA: Diagnosis present

## 2017-12-17 DIAGNOSIS — Y95 Nosocomial condition: Secondary | ICD-10-CM | POA: Diagnosis present

## 2017-12-17 DIAGNOSIS — Z7401 Bed confinement status: Secondary | ICD-10-CM | POA: Diagnosis not present

## 2017-12-17 DIAGNOSIS — Z515 Encounter for palliative care: Secondary | ICD-10-CM | POA: Diagnosis not present

## 2017-12-17 DIAGNOSIS — R293 Abnormal posture: Secondary | ICD-10-CM | POA: Diagnosis not present

## 2017-12-17 LAB — BASIC METABOLIC PANEL
ANION GAP: 14 (ref 5–15)
BUN: 32 mg/dL — ABNORMAL HIGH (ref 8–23)
CHLORIDE: 94 mmol/L — AB (ref 98–111)
CO2: 24 mmol/L (ref 22–32)
CREATININE: 5.05 mg/dL — AB (ref 0.61–1.24)
Calcium: 8.9 mg/dL (ref 8.9–10.3)
GFR calc non Af Amer: 11 mL/min — ABNORMAL LOW (ref >60)
GFR, EST AFRICAN AMERICAN: 12 mL/min — AB (ref >60)
Glucose, Bld: 102 mg/dL — ABNORMAL HIGH (ref 70–99)
Potassium: 3.9 mmol/L (ref 3.5–5.1)
SODIUM: 132 mmol/L — AB (ref 135–145)

## 2017-12-17 LAB — CBC
HEMATOCRIT: 30.6 % — AB (ref 39.0–52.0)
HEMOGLOBIN: 9.4 g/dL — AB (ref 13.0–17.0)
MCH: 26.8 pg (ref 26.0–34.0)
MCHC: 30.7 g/dL (ref 30.0–36.0)
MCV: 87.2 fL (ref 78.0–100.0)
Platelets: 144 10*3/uL — ABNORMAL LOW (ref 150–400)
RBC: 3.51 MIL/uL — ABNORMAL LOW (ref 4.22–5.81)
RDW: 20.2 % — ABNORMAL HIGH (ref 11.5–15.5)
WBC: 8.1 10*3/uL (ref 4.0–10.5)

## 2017-12-17 LAB — GLUCOSE, CAPILLARY
GLUCOSE-CAPILLARY: 108 mg/dL — AB (ref 70–99)
Glucose-Capillary: 93 mg/dL (ref 70–99)
Glucose-Capillary: 85 mg/dL (ref 70–99)
Glucose-Capillary: 61 mg/dL — ABNORMAL LOW (ref 70–99)
Glucose-Capillary: 68 mg/dL — ABNORMAL LOW (ref 70–99)
Glucose-Capillary: 89 mg/dL (ref 70–99)

## 2017-12-17 LAB — PROTIME-INR
INR: 2.16
PROTHROMBIN TIME: 24 s — AB (ref 11.4–15.2)

## 2017-12-17 LAB — MAGNESIUM: MAGNESIUM: 1.7 mg/dL (ref 1.7–2.4)

## 2017-12-17 LAB — HEPARIN LEVEL (UNFRACTIONATED)
HEPARIN UNFRACTIONATED: 0.5 [IU]/mL (ref 0.30–0.70)
Heparin Unfractionated: 0.37 IU/mL (ref 0.30–0.70)

## 2017-12-17 MED ORDER — PRO-STAT SUGAR FREE PO LIQD: 30.0000 mL | Freq: Three times a day (TID) | ORAL | 0 refills | Status: AC

## 2017-12-17 MED ORDER — MEXILETINE HCL 150 MG PO CAPS: 150.0000 mg | ORAL_CAPSULE | Freq: Two times a day (BID) | ORAL | 2 refills | Status: AC

## 2017-12-17 MED ORDER — MIRTAZAPINE 7.5 MG PO TABS: 7.5000 mg | ORAL_TABLET | Freq: Every day | ORAL | 3 refills | Status: AC

## 2017-12-17 MED ORDER — RANOLAZINE ER 500 MG PO TB12: 500.0000 mg | ORAL_TABLET | Freq: Two times a day (BID) | ORAL | 2 refills | Status: DC

## 2017-12-17 MED ORDER — MULTI-VITAMIN/MINERALS PO TABS: 1.0000 | ORAL_TABLET | Freq: Every day | ORAL | 5 refills | Status: AC

## 2017-12-17 MED ORDER — METOPROLOL TARTRATE 25 MG PO TABS: 12.5000 mg | ORAL_TABLET | Freq: Two times a day (BID) | ORAL | 4 refills | Status: AC

## 2017-12-17 MED ORDER — HEPARIN SODIUM (PORCINE) 1000 UNIT/ML DIALYSIS: 20.0000 [IU]/kg | INTRAMUSCULAR | Status: DC | PRN

## 2017-12-17 MED ORDER — NEPRO/CARBSTEADY PO LIQD: 237.0000 mL | Freq: Three times a day (TID) | ORAL | 2 refills | Status: AC

## 2017-12-17 MED ORDER — OXYCODONE-ACETAMINOPHEN 5-325 MG PO TABS: 1.0000 | ORAL_TABLET | Freq: Four times a day (QID) | ORAL | 0 refills | Status: DC | PRN

## 2017-12-17 MED ORDER — WARFARIN SODIUM 2 MG PO TABS
2.0000 mg | ORAL_TABLET | Freq: Once | ORAL | Status: DC
  Filled 2017-12-17: qty 1

## 2017-12-17 MED ORDER — WARFARIN SODIUM 2 MG PO TABS: 2.0000 mg | ORAL_TABLET | Freq: Every day | ORAL | 2 refills | Status: DC

## 2017-12-17 MED ORDER — RANOLAZINE ER 500 MG PO TB12: 500.0000 mg | ORAL_TABLET | Freq: Two times a day (BID) | ORAL | 2 refills | Status: AC

## 2017-12-17 NOTE — Procedures (Signed)
I was present at this dialysis session. I have reviewed the session itself and made appropriate changes.   TDC. 4K bath.  UF goal 3L.  Qb 400. Tol treatment well.  INR therapeutic today.   Filed Weights   12/14/17 1244 12/14/17 1659 12/15/17 0403  Weight: 80.8 kg (178 lb 2.1 oz) 80.3 kg (177 lb 0.5 oz) 82 kg (180 lb 12.4 oz)    Recent Labs  Lab 12/17/17 0032  NA 132*  K 3.9  CL 94*  CO2 24  GLUCOSE 102*  BUN 32*  CREATININE 5.05*  CALCIUM 8.9    Recent Labs  Lab 12/15/17 0513 12/16/17 0442 12/17/17 0032  WBC 4.3 6.7 8.1  NEUTROABS  --  5.0  --   HGB 9.0* 9.2* 9.4*  HCT 30.0* 30.5* 30.6*  MCV 89.6 88.4 87.2  PLT PLATELET CLUMPS NOTED ON SMEAR, COUNT APPEARS DECREASED 122* 144*    Scheduled Meds: . Chlorhexidine Gluconate Cloth  6 each Topical Q0600  . darbepoetin (ARANESP) injection - DIALYSIS  60 mcg Intravenous Q Thu-HD  . famotidine  20 mg Oral Daily  . feeding supplement (NEPRO CARB STEADY)  237 mL Oral TID BM  . mexiletine  150 mg Oral Q12H  . mirtazapine  7.5 mg Oral QHS  . ranolazine  500 mg Oral BID  . sodium chloride flush  3 mL Intravenous Q12H  . Warfarin - Pharmacist Dosing Inpatient   Does not apply q1800   Continuous Infusions: . sodium chloride     PRN Meds:.sodium chloride, acetaminophen, docusate, heparin, ondansetron (ZOFRAN) IV, sodium chloride flush   Pearson Grippe  MD 12/17/2017, 8:41 AM

## 2017-12-17 NOTE — Discharge Summary (Signed)
Patrick [name], is a 67 y.o. male  DOB 06-24-1950  MRN [id number].  Admission date:  12/07/2017  Admitting Physician  Reyne Dumas, MD  Discharge Date:  12/17/2017   Primary MD  Patient, [name]  Recommendations for primary care physician for things to follow:   1)Very low-salt diet advised 2)Weigh yourself daily, call if you gain more than 3 pounds in 1 day or more than 5 pounds in 1 week as your diuretic medications may need to be adjusted 3)Limit your Fluid  intake to no more than 60 ounces (1.8 Liters) per day 4)Aranesp/ESA agent with Hemodialysis as per Nephrologist 5)Recheck CBC and PT/INR on 12/19/17 and adjust coumadin per result of PT/INR 6)Eats snacks and frequent meals to avoid Low Blood Sugar 7)Avoid ibuprofen/Advil/Aleve/Motrin/Goody Powders/Naproxen/BC powders/Meloxicam/Diclofenac/Indomethacin and other Nonsteroidal anti-inflammatory medications as these will make you more likely to bleed and can cause stomach ulcers, can also cause Kidney problems 8)patient has had episodes of borderline hypoglycemia, please make sure patient is having adequate oral intake and snacks in between meals to prevent significant hypoglycemia   Admission Diagnosis  Respiratory arrest ([place]) [R09.2] VT (ventricular tachycardia) ([place]) [I47.2]   Discharge Diagnosis  Respiratory arrest ([place]) [R09.2] VT (ventricular tachycardia) ([place]) [I47.2]    Active Problems:   Acute on chronic combined systolic and diastolic CHF (congestive heart failure) (HCC)   Acute respiratory failure with hypoxia (HCC)   VT (ventricular tachycardia) (HCC)   Respiratory arrest (HCC)   Malnutrition of moderate degree      Past Medical History:  Diagnosis Date  . Arthritis    "right hand; right knee" (06/19/2014)  . CAD (coronary artery disease)    2v CAD with subtotal occlusion of small co-dominant RCA and borderline lesion in  moderate-sized OM-1  . Chronic systolic (congestive) heart failure ([place])    Preserved EF 2016, has been between 25% and 45% since then  . CKD (chronic kidney disease), stage III ([place])   . Coronary artery disease involving native coronary artery of native heart with angina pectoris ([place]) 09/11/2016  . Degenerative joint disease of knee, right   . Dyspnea   . Dysrhythmia   . Enlarged prostate   . Hyperlipidemia   . Hypertension   . Pneumonia 05/2014  . Prediabetes 10/03/2014  . Scrotal edema 04/03/2015  . SMALL BOWEL OBSTRUCTION, HX OF 08/21/2007   Annotation: with narrowing in the ileocecal region Qualifier: Diagnosis of  By: Hassell Done FNP, Tori Milks      Past Surgical History:  Procedure Laterality Date  . CARDIAC CATHETERIZATION N/A 02/16/2016   Procedure: Right/Left Heart Cath and Coronary Angiography;  Surgeon: Jolaine Artist, MD;  Location: [facility];  Service: Cardiovascular;  Laterality: N/A;  . COLONOSCOPY N/A 04/01/2016   Procedure: COLONOSCOPY;  Surgeon: Leighton Ruff, MD;  Location: [facility];  Service: Endoscopy;  Laterality: N/A;  . INGUINAL HERNIA REPAIR Left 1990's  . IR FLUORO GUIDE CV LINE RIGHT  10/18/2017  . IR GENERIC HISTORICAL  07/05/2016   IR RADIOLOGIST EVAL &  MGMT 07/05/2016 Corrie Mckusick, DO [facility]  . IR RADIOLOGIST EVAL & MGMT  03/29/2017  . IR US GUIDE VASC ACCESS RIGHT  10/18/2017  . LEFT HEART CATH AND CORONARY ANGIOGRAPHY N/A 12/13/2017   Procedure: LEFT HEART CATH AND CORONARY ANGIOGRAPHY;  Surgeon: Martinique, Peter M, MD;  Location: [facility];  Service: Cardiovascular;  Laterality: N/A;  . XI ROBOT ABDOMINAL PERINEAL RESECTION N/A 09/28/2016   Procedure: XI ROBOT ABDOMINOPERINEAL RESECTION WITH PERMANENT COLOSTOMY ERAS PATHWAY;  Surgeon: Michael Boston, MD;  Location: [facility];  Service: General;  Laterality: N/A;     HPI  from the history and physical done on the day of admission:    HISTORY OF PRESENT ILLNESS:   38yoM with CHF (EF 30%),  CAD, HTN, DM, OA, ESRD on HD (started HD on 10/09/17), Colon cancer s/p colostomy (2018), and Afib (on coumadin), presents to the ER following VT. He lives in a nursing home, where tonight he was found down on the floor beside his bed. Initially he was unresponsive but then woke up somewhat and c/o SOB. EMS was called and on moving him from the floor to the stretcher, patient developed VT. He was given Adenosine 6mg ,12mg ,12mg . Patient then became unresponsive and hypotensive and was defibrillated. He was then transferred to the ER where he was emergently intubated. At time of my exam patient is intubated but awake and calm. He does not remember any of the incident. He denies any CP or pain anywhere else. He has 1+ LLE edema but he says this is chronic. Discussed code status with him, and he nods that he is full code and would want defibrillation and cpr if needed    Hospital Course:    Brief Summary 67 y.o.malewith a hx of subtotal occlusion of the small codominantRCA and 70% OMtreated medically, S-D-CHF, HTN, HLD, borderline DM, SBO 2009, BPH, gout, OA. 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 tach subsequently intubated on 2019 the ED extubated on 12/08/2017, transferred to hospitalist service on 12/10/2017.  Left Heart catheterization on 12/13/2017 with obstructive CAD unchanged from September 2017   Plan:- 1)S/p VTach Arrest--stable at this time, cardiology input appreciated,  left heart catheterization on 12/13/2017 noted, medical management advised, continue aspirin and and Mexitil as well as metoprolol  2)ESRD--last hemodialysis session on 12/17/2017 nephrology input appreciated  3)Coronary Artery Disease-no chest pains , no on-going ACS type symptoms, patient has subtotally occluded codominant  RCA,, cardiology input appreciated, Left Heart catheterization on 12/13/2017 with obstructive CAD unchanged from September 2017, There is no change in coronary anatomy compared to September 2017. His cardiomyopathy is out of proportion to his CAD, c/n  Coumadin, pt had  IV heparin bridge as there is no further bleeding from right groin catheterization site.  C/n Coumadin ,  INR is therapeutic, Recheck CBC and PT/INR on 12/19/17 and adjust coumadin per result of PT/INR c/n aspirin and metoprolol as ordered  4)Persistent atrial fibrillation--stable at this time,  pt had IV heparin bridge as above , INR is now therapeutic, continue Coumadin, Recheck CBC and PT/INR on 12/19/17 and adjust coumadin per result of PT/INR ,  continue metoprolol 12.5 mg twice daily for rate control  5)HFrEF/ ischemic cardiomyopathy/systolic dysfunction CHF--EF 15 to 20%, cardiology input appreciated, using hemodialysis to address volume status  6)DM2-stable, last known  A1c 5.7, patient has had episodes of borderline hypoglycemia, please make sure patient is having adequate oral intake and snacks in between meals to prevent significant hypoglycemia   7)Anemia of CKD--- ASA/EPO agent as per nephrology service, hemoglobin is stable above 9 at this time,  8)Anorexia,/poor appetite/hypoglycemia--- c/n  nephro supplements, encourage increased oral intake, continue Remeron for appetite stimulation,   Code Status : full   Disposition Plan  : Discharge when INR is  Therapeutic, if no significant hypoglycemia Consults  : Nephrology/cardiology  Recheck CBC and PT/INR on 12/19/17 and adjust coumadin per result of PT/INR  Discharge Condition: stable  Follow UP  Contact information for after-discharge care    [facility] .   Service:  Skilled Nursing Contact information: [place] [phone]              Diet and Activity recommendation:  As  advised  Discharge Instructions    Discharge Instructions    ([place]) Call MD:  Anytime you have any of the following symptoms: 1) 3 pound weight gain in 24 hours or 5 pounds in 1 week 2) shortness of breath, with or without a dry hacking cough 3) swelling in the hands, feet or stomach 4) if you have to sleep on extra pillows at night in order to breathe.   Complete by:  As directed    Call MD for:  difficulty breathing, headache or visual disturbances   Complete by:  As directed    Call MD for:  persistant dizziness or light-headedness   Complete by:  As directed    Call MD for:  persistant nausea and vomiting   Complete by:  As directed    Call MD for:  severe uncontrolled pain   Complete by:  As directed    Call MD for:  temperature >100.4   Complete by:  As directed    Diet - low sodium heart healthy   Complete by:  As directed    Discharge instructions   Complete by:  As directed    1)Very low-salt diet advised 2)Weigh yourself daily, call if you gain more than 3 pounds in 1 day or more than 5 pounds in 1 week as your diuretic medications may need to be adjusted 3)Limit your Fluid  intake to no more than 60 ounces (1.8 Liters) per day 4)Aranesp/ESA agent with Hemodialysis as per Nephrologist 5)Recheck CBC and PT/INR on 12/19/17 and adjust coumadin per result of PT/INR 6)Eats snacks and frequent meals to avoid Low Blood Sugar 7)Avoid ibuprofen/Advil/Aleve/Motrin/Goody Powders/Naproxen/BC powders/Meloxicam/Diclofenac/Indomethacin and other Nonsteroidal anti-inflammatory medications as these will make you more likely to bleed and can cause stomach ulcers, can also cause Kidney problems 8)patient has had episodes of borderline hypoglycemia, please make sure patient is having adequate oral intake and snacks in between meals to prevent significant hypoglycemia   Increase activity slowly   Complete by:  As directed         Discharge Medications     Allergies as of  12/17/2017      Reactions   Nsaids    Kidney disease      Medication List    STOP taking these medications   nitroGLYCERIN 0.4 MG SL tablet Commonly known as:  NITROSTAT   traZODone 50 MG tablet Commonly known as:  DESYREL     TAKE these medications   acetaminophen 500 MG tablet Commonly known as:  TYLENOL Take 1,000 mg  by mouth every 6 (six) hours as needed (for pain/fever/headaches.).   allopurinol 300 MG tablet Commonly known as:  ZYLOPRIM take 1 tablet by mouth once daily   atorvastatin 10 MG tablet Commonly known as:  LIPITOR Take 10 mg by mouth at bedtime.   Baclofen 5 MG Tabs Take 5 mg by mouth 3 (three) times daily as needed for muscle spasms.   buPROPion 100 MG tablet Commonly known as:  WELLBUTRIN take 1 tablet by mouth twice a day   feeding supplement (NEPRO CARB STEADY) Liqd Take 237 mLs by mouth 3 (three) times daily between meals.   feeding supplement (PRO-STAT SUGAR FREE 64) Liqd Take 30 mLs by mouth 3 (three) times daily with meals.   Melatonin 5 MG Tabs Take 5 mg by mouth at bedtime.   metoprolol tartrate 25 MG tablet Commonly known as:  LOPRESSOR Take 0.5 tablets (12.5 mg total) by mouth 2 (two) times daily.   mexiletine 150 MG capsule Commonly known as:  MEXITIL Take 1 capsule (150 mg total) by mouth 2 (two) times daily.   mirtazapine 7.5 MG tablet Commonly known as:  REMERON Take 1 tablet (7.5 mg total) by mouth at bedtime.   multivitamin with minerals tablet Take 1 tablet by mouth daily. Men's 50+ Multivitamin   oxyCODONE-acetaminophen 5-325 MG tablet Commonly known as:  PERCOCET/ROXICET Take 1 tablet by mouth every 6 (six) hours as needed for severe pain.   pantoprazole 40 MG tablet Commonly known as:  PROTONIX Take 1 tablet (40 mg total) by mouth daily.   ranolazine 500 MG 12 hr tablet Commonly known as:  RANEXA Take 1 tablet (500 mg total) by mouth 2 (two) times daily.   warfarin 2 MG tablet Commonly known as:   COUMADIN Take 1 tablet (2 mg total) by mouth daily at 6 PM.       Major procedures and Radiology Reports - PLEASE review detailed and final reports for all details, in brief -   Ct Head Wo Contrast  Result Date: 12/07/2017 CLINICAL DATA:  Found down at side of bed, cardioverted. History of hypertension, hyperlipidemia. EXAM: CT HEAD WITHOUT CONTRAST CT CERVICAL SPINE WITHOUT CONTRAST TECHNIQUE: Multidetector CT imaging of the head and cervical spine was performed following the standard protocol without intravenous contrast. Multiplanar CT image reconstructions of the cervical spine were also generated. COMPARISON:  CT HEAD June 01, 2014 FINDINGS: CT HEAD FINDINGS BRAIN: No intraparenchymal hemorrhage, mass effect nor midline shift. Moderate parenchymal brain volume loss. No hydrocephalus. Patchy supratentorial white matter hypodensities. No acute large vascular territory infarcts. No abnormal extra-axial fluid collections. Basal cisterns are patent. VASCULAR: Moderate calcific atherosclerosis of the carotid siphons. SKULL: No skull fracture. No significant scalp soft tissue swelling. Focal calcified parietal convexity tricholemmal cyst. SINUSES/ORBITS: Mild-to-moderate paranasal sinus mucosal thickening. Mastoid air cells are well aerated.The included ocular globes and orbital contents are non-suspicious. OTHER: Life-support lines in place. CT CERVICAL SPINE FINDINGS ALIGNMENT: Straightened lordosis.  Vertebral bodies in alignment. SKULL BASE AND VERTEBRAE: Cervical vertebral bodies and posterior elements are intact. Moderate to severe C3-4 through C6-7 disc height loss with endplate spurring compatible with degenerative discs, moderate C2-3. C1-2 articulation maintained with moderate arthropathy. No destructive bony lesions. SOFT TISSUES AND SPINAL CANAL: Nonacute. Mild calcific atherosclerosis carotid bifurcations. DISC LEVELS: Severe LEFT C3-4, RIGHT C4-5 neural foraminal narrowing. Mild canal  stenosis C6-7. UPPER CHEST: Lung apices are clear. OTHER: Life-support lines in place. IMPRESSION: CT HEAD: 1. No acute intracranial process. 2. Moderate parenchymal brain volume  loss. 3. Moderate chronic small vessel ischemic changes. CT CERVICAL SPINE: 1. No fracture or malalignment. Electronically Signed   By: Elon Alas M.D.   On: 12/07/2017 03:20   Ct Cervical Spine Wo Contrast  Result Date: 12/07/2017 CLINICAL DATA:  Found down at side of bed, cardioverted. History of hypertension, hyperlipidemia. EXAM: CT HEAD WITHOUT CONTRAST CT CERVICAL SPINE WITHOUT CONTRAST TECHNIQUE: Multidetector CT imaging of the head and cervical spine was performed following the standard protocol without intravenous contrast. Multiplanar CT image reconstructions of the cervical spine were also generated. COMPARISON:  CT HEAD June 01, 2014 FINDINGS: CT HEAD FINDINGS BRAIN: No intraparenchymal hemorrhage, mass effect nor midline shift. Moderate parenchymal brain volume loss. No hydrocephalus. Patchy supratentorial white matter hypodensities. No acute large vascular territory infarcts. No abnormal extra-axial fluid collections. Basal cisterns are patent. VASCULAR: Moderate calcific atherosclerosis of the carotid siphons. SKULL: No skull fracture. No significant scalp soft tissue swelling. Focal calcified parietal convexity tricholemmal cyst. SINUSES/ORBITS: Mild-to-moderate paranasal sinus mucosal thickening. Mastoid air cells are well aerated.The included ocular globes and orbital contents are non-suspicious. OTHER: Life-support lines in place. CT CERVICAL SPINE FINDINGS ALIGNMENT: Straightened lordosis.  Vertebral bodies in alignment. SKULL BASE AND VERTEBRAE: Cervical vertebral bodies and posterior elements are intact. Moderate to severe C3-4 through C6-7 disc height loss with endplate spurring compatible with degenerative discs, moderate C2-3. C1-2 articulation maintained with moderate arthropathy. No destructive bony  lesions. SOFT TISSUES AND SPINAL CANAL: Nonacute. Mild calcific atherosclerosis carotid bifurcations. DISC LEVELS: Severe LEFT C3-4, RIGHT C4-5 neural foraminal narrowing. Mild canal stenosis C6-7. UPPER CHEST: Lung apices are clear. OTHER: Life-support lines in place. IMPRESSION: CT HEAD: 1. No acute intracranial process. 2. Moderate parenchymal brain volume loss. 3. Moderate chronic small vessel ischemic changes. CT CERVICAL SPINE: 1. No fracture or malalignment. Electronically Signed   By: Elon Alas M.D.   On: 12/07/2017 03:20   Dg Chest Port 1 View  Result Date: 12/16/2017 CLINICAL DATA:  67 year old male with shortness of breath. EXAM: PORTABLE CHEST 1 VIEW COMPARISON:  Chest radiograph dated 12/08/2017 FINDINGS: Right-sided dialysis catheter in similar position. There is shallow inspiration with bibasilar atelectasis. No focal consolidation, pleural effusion, or pneumothorax. There is stable cardiomegaly. No significant vascular congestion or edema. No acute osseous pathology. IMPRESSION: Shallow inspiration with bibasilar atelectasis. No focal consolidation. Cardiomegaly. Electronically Signed   By: Anner Crete M.D.   On: 12/16/2017 05:07   Dg Chest Port 1 View  Result Date: 12/08/2017 CLINICAL DATA:  Respiratory failure, endotracheal tube present. EXAM: PORTABLE CHEST 1 VIEW COMPARISON:  Radiograph December 07, 2017. FINDINGS: Stable cardiomegaly. Endotracheal and nasogastric tubes are unchanged in position. Right internal jugular dialysis catheter is unchanged. Stable mild right basilar subsegmental atelectasis is noted. Stable left basilar opacity is noted concerning for atelectasis with associated pleural effusion. No pneumothorax is noted. Bony thorax is unremarkable. IMPRESSION: Stable support apparatus. Stable bibasilar opacities, left greater than right, as described above. Electronically Signed   By: Marijo Conception, M.D.   On: 12/08/2017 08:51   Dg Chest Port 1 View  Result  Date: 12/07/2017 CLINICAL DATA:  Intubation. EXAM: PORTABLE CHEST 1 VIEW COMPARISON:  Chest radiograph Oct 23, 2017 FINDINGS: Endotracheal tube tip projects 16 mm above the carina. Nasogastric tube looped in proximal stomach, distal tip projecting at GE junction. Multiple EKG lines overlie the patient and may obscure subtle underlying pathology. Tunneled dialysis catheter via RIGHT internal jugular venous approach with distal tip projecting in RIGHT atrium. Cardiomegaly and pulmonary  vascular congestion. Low inspiratory examination with crowded vascular markings. Potential LEFT pleural effusion. RIGHT costophrenic angle out of field of view. No pneumothorax. Soft tissue planes and included osseous structures are non suspicious. IMPRESSION: Endotracheal tube tip projects 16 mm above the carina. Nasogastric tube looped in proximal stomach, distal tip projecting at GE junction. Cardiomegaly and pulmonary vascular congestion. Electronically Signed   By: Elon Alas M.D.   On: 12/07/2017 02:24    Micro Results   No results found for this or any previous visit (from the past 240 hour(s)).     Today   Subjective    Patrick [name] today has no new complaints, tolerated hemodialysis well on 12/17/2017, no bleeding concerns,           Patient has been seen and examined prior to discharge   Objective   Blood pressure 125/80, pulse 85, temperature (!) 97.3 F (36.3 C), temperature source Oral, resp. rate (!) 30, height 5\' 7"  (1.702 m), weight 83.7 kg (184 lb 8.4 oz), SpO2 96 %.   Intake/Output Summary (Last 24 hours) at 12/17/2017 1332 Last data filed at 12/17/2017 1235 Gross per 24 hour  Intake 701.19 ml  Output 2024 ml  Net -1322.81 ml    Exam Gen:- Awake Alert,  In no apparent distress  HEENT:- [place].AT, No sclera icterus Neck-Supple Neck,.  Right IJ hemodialysis catheter site is clean dry and intact Lungs-  CTAB , good air movement CV- S1, S2 normal Abd-  +ve B.Sounds, Abd Soft, No  tenderness, colostomy bag with fecal contents Extremity/Skin:- No  edema,   good pulses, no further bleeding or hematoma from right groin prior catheterization site Psych-affect is appropriate, oriented x3 Neuro-no new focal deficits, no tremors    Data Review   CBC w Diff:  Lab Results  Component Value Date   WBC 8.1 12/17/2017   HGB 9.4 (L) 12/17/2017   HGB 10.9 (L) 02/20/2017   HGB 10.8 (L) 12/22/2016   HCT 30.6 (L) 12/17/2017   HCT 34.6 (L) 02/20/2017   HCT 34.7 (L) 12/22/2016   PLT 144 (L) 12/17/2017   PLT 102 (L) 02/20/2017   LYMPHOPCT 9 12/16/2017   LYMPHOPCT 7.6 (L) 12/22/2016   MONOPCT 14 12/16/2017   MONOPCT 8.8 12/22/2016   EOSPCT 1 12/16/2017   EOSPCT 1.0 12/22/2016   BASOPCT 0 12/16/2017   BASOPCT 0.1 12/22/2016    CMP:  Lab Results  Component Value Date   NA 132 (L) 12/17/2017   NA 144 02/22/2017   NA 143 12/22/2016   K 3.9 12/17/2017   K 4.3 12/22/2016   CL 94 (L) 12/17/2017   CO2 24 12/17/2017   CO2 26 12/22/2016   BUN 32 (H) 12/17/2017   BUN 23 02/22/2017   BUN 16.2 12/22/2016   CREATININE 5.05 (H) 12/17/2017   CREATININE 1.2 12/22/2016   GLU 124 11/11/2016   PROT 6.4 (L) 12/07/2017   PROT 7.6 12/22/2016   ALBUMIN 2.3 (L) 12/07/2017   ALBUMIN 3.2 (L) 12/22/2016   BILITOT 1.2 12/07/2017   BILITOT 0.63 12/22/2016   ALKPHOS 166 (H) 12/07/2017   ALKPHOS 118 12/22/2016   AST 67 (H) 12/07/2017   AST 13 12/22/2016   ALT 25 12/07/2017   ALT 9 12/22/2016     Total Discharge time is about 33 minutes  Roxan Hockey M.D on 12/17/2017 at 1:32 PM   Go to [email or web address] - password [email or web address] for contact info  [facility] - Office  [phone]

## 2017-12-17 NOTE — Progress Notes (Signed)
Per Dr. Denton Brick, pt is ready to be D/C today. He resides at [facility]. Contacted SW and left a VM regarding pt's D/C plan for today.

## 2017-12-17 NOTE — Progress Notes (Signed)
CSW left voice & text message for facility to contact CSW for disposition to facility.  Will continue to follow.  Reed Breech LCSWA [phone]

## 2017-12-17 NOTE — Progress Notes (Signed)
ANTICOAGULATION CONSULT NOTE - Follow Up Consult  Pharmacy Consult for Coumadin Indication: atrial fibrillation  Allergies  Allergen Reactions  . Nsaids     Kidney disease     Labs: Recent Labs    12/15/17 0513 12/16/17 0442 12/17/17 0000 12/17/17 0032 12/17/17 0647  HGB 9.0* 9.2*  --  9.4*  --   HCT 30.0* 30.5*  --  30.6*  --   PLT PLATELET CLUMPS NOTED ON SMEAR, COUNT APPEARS DECREASED 122*  --  144*  --   LABPROT 18.8* 18.9*  --  24.0*  --   INR 1.59 1.60  --  2.16  --   HEPARINUNFRC 0.38  --  0.37  --  0.50  CREATININE 3.30* 4.27*  --  5.05*  --     Estimated Creatinine Clearance: 14.3 mL/min (A) (by C-G formula based on SCr of 5.05 mg/dL (H)).  Assessment:  67 yo male admitted with VT.  On chronic Coumadin for afib with INR 3.02 on admission 12/07/17.  Last PTA warfarin dose recorded as 12/06/17. PTA warfarin regimen 2mg  daily. S/p cardiac cath 7/17 for which patient received vitamin K 2.5mg .   Amiodarone stopped  Heparin now stopped INR today = 2.16   Goal of Therapy:  Monitor platelets by anticoagulation protocol: Yes  INR: 2-3      Plan:  Warfarin 2 mg po x 1 today May need a higher dose at discharge with amiodarone dc - 4 mg MWF, 2 mg other days  Daily INR  Thank you Anette Guarneri, PharmD [phone]  12/17/2017 11:18 AM

## 2017-12-17 NOTE — Discharge Instructions (Signed)
1)Very low-salt diet advised 2)Weigh yourself daily, call if you gain more than 3 pounds in 1 day or more than 5 pounds in 1 week as your diuretic medications may need to be adjusted 3)Limit your Fluid  intake to no more than 60 ounces (1.8 Liters) per day 4)Aranesp/ESA agent with Hemodialysis as per Nephrologist 5)Recheck CBC and PT/INR on 12/19/17 and adjust coumadin per result of PT/INR 6)Eats snacks and frequent meals to avoid Low Blood Sugar 7)Avoid ibuprofen/Advil/Aleve/Motrin/Goody Powders/Naproxen/BC powders/Meloxicam/Diclofenac/Indomethacin and other Nonsteroidal anti-inflammatory medications as these will make you more likely to bleed and can cause stomach ulcers, can also cause Kidney problems.  8)Patient has had episodes of borderline hypoglycemia, please make sure patient is having adequate oral intake and snacks in between meals to prevent significant hypoglycemia

## 2017-12-17 NOTE — Progress Notes (Signed)
Received pt back from HD.  CBG 61.  States he did not eat lunch much.  He denied any symptoms.  Administered juice and crackers. CBG went up to 89.  Idolina Primer, RN

## 2017-12-17 NOTE — Progress Notes (Addendum)
Report was given to nurse Joaquim Lai at [facility].  His belongings were returned to him from the security.  Idolina Primer, RN

## 2017-12-17 NOTE — Progress Notes (Signed)
ANTICOAGULATION CONSULT NOTE - Follow Up Consult  Pharmacy Consult for Heparin  Indication: atrial fibrillation  Allergies  Allergen Reactions  . Nsaids     Kidney disease    Patient Measurements: Height: 5\' 7"  (170.2 cm)(measured at bedside by RT) Weight: 180 lb 12.4 oz (82 kg) IBW/kg (Calculated) : 66.1 Heparin Dosing Weight: 80 kg  Vital Signs: Temp: 97.7 F (36.5 C) (07/21 0015) Temp Source: Oral (07/21 0015) BP: 138/82 (07/21 0015) Pulse Rate: 88 (07/21 0015)  Labs: Recent Labs    12/14/17 0622  12/15/17 0513 12/16/17 0442 12/17/17 0000 12/17/17 0032  HGB 9.4*  --  9.0* 9.2*  --  9.4*  HCT 30.8*  --  30.0* 30.5*  --  30.6*  PLT 145*  --  PLATELET CLUMPS NOTED ON SMEAR, COUNT APPEARS DECREASED 122*  --  144*  LABPROT 18.2*  --  18.8* 18.9*  --  24.0*  INR 1.53  --  1.59 1.60  --  2.16  HEPARINUNFRC 0.35  --  0.38  --  0.37  --   CREATININE  --    < > 3.30* 4.27*  --  5.05*   < > = values in this interval not displayed.    Estimated Creatinine Clearance: 14.2 mL/min (A) (by C-G formula based on SCr of 5.05 mg/dL (H)).  Assessment:  67 yo male admitted with VT.  On chronic Coumadin for afib with INR 3.02 on admission 12/07/17.  Last PTA warfarin dose recorded as 12/06/17. PTA warfarin regimen 2mg  daily. S/p cardiac cath 7/17 for which patient received vitamin K 2.5mg .   Pharmacy consulted to resume Coumadin on 7/18. Today INR continues to be subtherapeutic at 1.6 likely still due to vitamin K. Noted that patient recently discontinued from amiodarone.  7/21 AM update: heparin level therapeutic x 1 after re-start, Plts with slight trend up to 144, Hgb stable  Goal of Therapy:  Heparin level 0.3-0.7 units/ml Monitor platelets by anticoagulation protocol: Yes   Plan:  Cont heparin at 1850 units/hr 0900 HL INR 2.16-can likely DC heparin today or tomorrow Monitor for bleeding  Narda Bonds, PharmD, BCPS Clinical Pharmacist Phone: [phone]

## 2017-12-17 NOTE — Progress Notes (Signed)
Patient will Discharge To: [facility] Anticipated DC Date:12/17/17 Family Notified:yes, Dabney Schanz [phone] Transport GY:BNLW   Per MD patient ready for DC to [facility], patient, patient's family, and facility notified of DC. Assessment, Fl2/Pasrr, and Discharge Summary sent to facility. RN given number for report [phone] ask to speak with Joaquim Lai on [employer].  Patient is assigned to [address] Bed [address]). DC packet on chart. Ambulance transport requested for patient.   CSW signing off.  Reed Breech LCSWA [phone]

## 2017-12-18 ENCOUNTER — Emergency Department (HOSPITAL_COMMUNITY): Payer: Medicare Other

## 2017-12-18 ENCOUNTER — Encounter (HOSPITAL_COMMUNITY): Payer: Self-pay | Admitting: Emergency Medicine

## 2017-12-18 ENCOUNTER — Inpatient Hospital Stay (HOSPITAL_COMMUNITY)
Admission: EM | Admit: 2017-12-18 | Discharge: 2017-12-21 | DRG: 208 | Disposition: A | Discharge status: Skilled Nursing Facility | Payer: Medicare Other | Attending: Pulmonary Disease | Admitting: Pulmonary Disease

## 2017-12-18 ENCOUNTER — Inpatient Hospital Stay (HOSPITAL_COMMUNITY): Payer: Medicare Other

## 2017-12-18 DIAGNOSIS — J9601 Acute respiratory failure with hypoxia: Secondary | ICD-10-CM

## 2017-12-18 DIAGNOSIS — I4891 Unspecified atrial fibrillation: Secondary | ICD-10-CM | POA: Diagnosis present

## 2017-12-18 DIAGNOSIS — Z933 Colostomy status: Secondary | ICD-10-CM

## 2017-12-18 DIAGNOSIS — J189 Pneumonia, unspecified organism: Secondary | ICD-10-CM | POA: Diagnosis not present

## 2017-12-18 DIAGNOSIS — R092 Respiratory arrest: Secondary | ICD-10-CM | POA: Diagnosis not present

## 2017-12-18 DIAGNOSIS — J9600 Acute respiratory failure, unspecified whether with hypoxia or hypercapnia: Secondary | ICD-10-CM | POA: Diagnosis not present

## 2017-12-18 DIAGNOSIS — Z992 Dependence on renal dialysis: Secondary | ICD-10-CM

## 2017-12-18 DIAGNOSIS — E162 Hypoglycemia, unspecified: Secondary | ICD-10-CM | POA: Diagnosis not present

## 2017-12-18 DIAGNOSIS — I132 Hypertensive heart and chronic kidney disease with heart failure and with stage 5 chronic kidney disease, or end stage renal disease: Secondary | ICD-10-CM | POA: Diagnosis present

## 2017-12-18 DIAGNOSIS — M109 Gout, unspecified: Secondary | ICD-10-CM | POA: Diagnosis present

## 2017-12-18 DIAGNOSIS — J9690 Respiratory failure, unspecified, unspecified whether with hypoxia or hypercapnia: Secondary | ICD-10-CM | POA: Diagnosis not present

## 2017-12-18 DIAGNOSIS — I120 Hypertensive chronic kidney disease with stage 5 chronic kidney disease or end stage renal disease: Secondary | ICD-10-CM | POA: Diagnosis not present

## 2017-12-18 DIAGNOSIS — I472 Ventricular tachycardia: Secondary | ICD-10-CM | POA: Diagnosis present

## 2017-12-18 DIAGNOSIS — Y95 Nosocomial condition: Secondary | ICD-10-CM | POA: Diagnosis present

## 2017-12-18 DIAGNOSIS — Z87891 Personal history of nicotine dependence: Secondary | ICD-10-CM

## 2017-12-18 DIAGNOSIS — F329 Major depressive disorder, single episode, unspecified: Secondary | ICD-10-CM | POA: Diagnosis present

## 2017-12-18 DIAGNOSIS — E785 Hyperlipidemia, unspecified: Secondary | ICD-10-CM | POA: Diagnosis present

## 2017-12-18 DIAGNOSIS — J90 Pleural effusion, not elsewhere classified: Secondary | ICD-10-CM | POA: Diagnosis not present

## 2017-12-18 DIAGNOSIS — Z515 Encounter for palliative care: Secondary | ICD-10-CM | POA: Diagnosis present

## 2017-12-18 DIAGNOSIS — R4020 Unspecified coma: Secondary | ICD-10-CM | POA: Diagnosis not present

## 2017-12-18 DIAGNOSIS — I4510 Unspecified right bundle-branch block: Secondary | ICD-10-CM | POA: Diagnosis not present

## 2017-12-18 DIAGNOSIS — R402342 Coma scale, best motor response, flexion withdrawal, at arrival to emergency department: Secondary | ICD-10-CM | POA: Diagnosis present

## 2017-12-18 DIAGNOSIS — M2550 Pain in unspecified joint: Secondary | ICD-10-CM | POA: Diagnosis not present

## 2017-12-18 DIAGNOSIS — D631 Anemia in chronic kidney disease: Secondary | ICD-10-CM | POA: Diagnosis present

## 2017-12-18 DIAGNOSIS — R402222 Coma scale, best verbal response, incomprehensible words, at arrival to emergency department: Secondary | ICD-10-CM | POA: Diagnosis not present

## 2017-12-18 DIAGNOSIS — R627 Adult failure to thrive: Secondary | ICD-10-CM | POA: Diagnosis present

## 2017-12-18 DIAGNOSIS — I499 Cardiac arrhythmia, unspecified: Secondary | ICD-10-CM | POA: Diagnosis not present

## 2017-12-18 DIAGNOSIS — E872 Acidosis, unspecified: Secondary | ICD-10-CM

## 2017-12-18 DIAGNOSIS — Z66 Do not resuscitate: Secondary | ICD-10-CM | POA: Diagnosis not present

## 2017-12-18 DIAGNOSIS — N2581 Secondary hyperparathyroidism of renal origin: Secondary | ICD-10-CM | POA: Diagnosis not present

## 2017-12-18 DIAGNOSIS — D696 Thrombocytopenia, unspecified: Secondary | ICD-10-CM | POA: Diagnosis not present

## 2017-12-18 DIAGNOSIS — I5022 Chronic systolic (congestive) heart failure: Secondary | ICD-10-CM | POA: Diagnosis not present

## 2017-12-18 DIAGNOSIS — I2510 Atherosclerotic heart disease of native coronary artery without angina pectoris: Secondary | ICD-10-CM | POA: Diagnosis not present

## 2017-12-18 DIAGNOSIS — Z7901 Long term (current) use of anticoagulants: Secondary | ICD-10-CM

## 2017-12-18 DIAGNOSIS — Z79899 Other long term (current) drug therapy: Secondary | ICD-10-CM

## 2017-12-18 DIAGNOSIS — I429 Cardiomyopathy, unspecified: Secondary | ICD-10-CM | POA: Diagnosis not present

## 2017-12-18 DIAGNOSIS — G9341 Metabolic encephalopathy: Secondary | ICD-10-CM | POA: Diagnosis not present

## 2017-12-18 DIAGNOSIS — E43 Unspecified severe protein-calorie malnutrition: Secondary | ICD-10-CM

## 2017-12-18 DIAGNOSIS — I959 Hypotension, unspecified: Secondary | ICD-10-CM | POA: Diagnosis present

## 2017-12-18 DIAGNOSIS — Z7189 Other specified counseling: Secondary | ICD-10-CM | POA: Diagnosis not present

## 2017-12-18 DIAGNOSIS — R404 Transient alteration of awareness: Secondary | ICD-10-CM | POA: Diagnosis not present

## 2017-12-18 DIAGNOSIS — R410 Disorientation, unspecified: Secondary | ICD-10-CM

## 2017-12-18 DIAGNOSIS — N400 Enlarged prostate without lower urinary tract symptoms: Secondary | ICD-10-CM | POA: Diagnosis present

## 2017-12-18 DIAGNOSIS — Z886 Allergy status to analgesic agent status: Secondary | ICD-10-CM

## 2017-12-18 DIAGNOSIS — L8990 Pressure ulcer of unspecified site, unspecified stage: Secondary | ICD-10-CM

## 2017-12-18 DIAGNOSIS — R918 Other nonspecific abnormal finding of lung field: Secondary | ICD-10-CM | POA: Diagnosis not present

## 2017-12-18 DIAGNOSIS — A419 Sepsis, unspecified organism: Secondary | ICD-10-CM

## 2017-12-18 DIAGNOSIS — R402122 Coma scale, eyes open, to pain, at arrival to emergency department: Secondary | ICD-10-CM | POA: Diagnosis present

## 2017-12-18 DIAGNOSIS — R4182 Altered mental status, unspecified: Secondary | ICD-10-CM | POA: Diagnosis present

## 2017-12-18 DIAGNOSIS — Z7401 Bed confinement status: Secondary | ICD-10-CM | POA: Diagnosis not present

## 2017-12-18 DIAGNOSIS — N186 End stage renal disease: Secondary | ICD-10-CM

## 2017-12-18 DIAGNOSIS — Z4682 Encounter for fitting and adjustment of non-vascular catheter: Secondary | ICD-10-CM | POA: Diagnosis not present

## 2017-12-18 DIAGNOSIS — E161 Other hypoglycemia: Secondary | ICD-10-CM | POA: Diagnosis not present

## 2017-12-18 DIAGNOSIS — Z8249 Family history of ischemic heart disease and other diseases of the circulatory system: Secondary | ICD-10-CM

## 2017-12-18 DIAGNOSIS — I469 Cardiac arrest, cause unspecified: Secondary | ICD-10-CM | POA: Diagnosis not present

## 2017-12-18 DIAGNOSIS — Z85038 Personal history of other malignant neoplasm of large intestine: Secondary | ICD-10-CM

## 2017-12-18 DIAGNOSIS — Z6829 Body mass index (BMI) 29.0-29.9, adult: Secondary | ICD-10-CM

## 2017-12-18 LAB — URINALYSIS, MICROSCOPIC (REFLEX)

## 2017-12-18 LAB — I-STAT CHEM 8, ED
BUN: 29 mg/dL — ABNORMAL HIGH (ref 8–23)
CALCIUM ION: 1.07 mmol/L — AB (ref 1.15–1.40)
Chloride: 98 mmol/L (ref 98–111)
Creatinine, Ser: 4 mg/dL — ABNORMAL HIGH (ref 0.61–1.24)
GLUCOSE: 96 mg/dL (ref 70–99)
HCT: 38 % — ABNORMAL LOW (ref 39.0–52.0)
Hemoglobin: 12.9 g/dL — ABNORMAL LOW (ref 13.0–17.0)
Potassium: 5.2 mmol/L — ABNORMAL HIGH (ref 3.5–5.1)
SODIUM: 136 mmol/L (ref 135–145)
TCO2: 22 mmol/L (ref 22–32)

## 2017-12-18 LAB — GLUCOSE, CAPILLARY
GLUCOSE-CAPILLARY: 82 mg/dL (ref 70–99)
GLUCOSE-CAPILLARY: 86 mg/dL (ref 70–99)
GLUCOSE-CAPILLARY: 98 mg/dL (ref 70–99)
Glucose-Capillary: 87 mg/dL (ref 70–99)

## 2017-12-18 LAB — I-STAT ARTERIAL BLOOD GAS, ED
Acid-base deficit: 5 mmol/L — ABNORMAL HIGH (ref 0.0–2.0)
Acid-base deficit: 4 mmol/L — ABNORMAL HIGH (ref 0.0–2.0)
BICARBONATE: 21.1 mmol/L (ref 20.0–28.0)
Bicarbonate: 20 mmol/L (ref 20.0–28.0)
O2 SAT: 100 %
O2 Saturation: 98 %
PCO2 ART: 38.5 mmHg (ref 32.0–48.0)
PH ART: 7.35 (ref 7.350–7.450)
PO2 ART: 199 mmHg — AB (ref 83.0–108.0)
PO2 ART: 113 mmHg — AB (ref 83.0–108.0)
Patient temperature: 98.6
Patient temperature: 98.6
TCO2: 21 mmol/L — AB (ref 22–32)
TCO2: 22 mmol/L (ref 22–32)
pCO2 arterial: 36.2 mmHg (ref 32.0–48.0)
pH, Arterial: 7.347 — ABNORMAL LOW (ref 7.350–7.450)

## 2017-12-18 LAB — MAGNESIUM
MAGNESIUM: 2 mg/dL (ref 1.7–2.4)
Magnesium: 1.9 mg/dL (ref 1.7–2.4)

## 2017-12-18 LAB — CBC WITH DIFFERENTIAL/PLATELET
BASOS PCT: 0 %
Basophils Absolute: 0 10*3/uL (ref 0.0–0.1)
EOS PCT: 0 %
Eosinophils Absolute: 0 10*3/uL (ref 0.0–0.7)
HEMATOCRIT: 36.5 % — AB (ref 39.0–52.0)
Hemoglobin: 10.5 g/dL — ABNORMAL LOW (ref 13.0–17.0)
LYMPHS ABS: 0.4 10*3/uL — AB (ref 0.7–4.0)
Lymphocytes Relative: 5 %
MCH: 26.9 pg (ref 26.0–34.0)
MCHC: 28.8 g/dL — ABNORMAL LOW (ref 30.0–36.0)
MCV: 93.6 fL (ref 78.0–100.0)
Monocytes Absolute: 0.9 10*3/uL (ref 0.1–1.0)
Monocytes Relative: 12 %
Neutro Abs: 6.5 10*3/uL (ref 1.7–7.7)
Neutrophils Relative %: 83 %
Platelets: 87 10*3/uL — ABNORMAL LOW (ref 150–400)
RBC: 3.9 MIL/uL — AB (ref 4.22–5.81)
RDW: 21.4 % — ABNORMAL HIGH (ref 11.5–15.5)
WBC: 7.8 10*3/uL (ref 4.0–10.5)

## 2017-12-18 LAB — I-STAT CG4 LACTIC ACID, ED: LACTIC ACID, VENOUS: 7.85 mmol/L — AB (ref 0.5–1.9)

## 2017-12-18 LAB — URINALYSIS, ROUTINE W REFLEX MICROSCOPIC
Glucose, UA: NEGATIVE mg/dL
Ketones, ur: NEGATIVE mg/dL
Nitrite: POSITIVE — AB
SPECIFIC GRAVITY, URINE: 1.015 (ref 1.005–1.030)
pH: 7 (ref 5.0–8.0)

## 2017-12-18 LAB — COMPREHENSIVE METABOLIC PANEL
ALT: 180 U/L — AB (ref 0–44)
AST: 458 U/L — AB (ref 15–41)
Albumin: 2.7 g/dL — ABNORMAL LOW (ref 3.5–5.0)
Alkaline Phosphatase: 138 U/L — ABNORMAL HIGH (ref 38–126)
Anion gap: 18 — ABNORMAL HIGH (ref 5–15)
BILIRUBIN TOTAL: 2.5 mg/dL — AB (ref 0.3–1.2)
BUN: 25 mg/dL — AB (ref 8–23)
CO2: 20 mmol/L — ABNORMAL LOW (ref 22–32)
CREATININE: 4.1 mg/dL — AB (ref 0.61–1.24)
Calcium: 9.1 mg/dL (ref 8.9–10.3)
Chloride: 98 mmol/L (ref 98–111)
GFR calc Af Amer: 16 mL/min — ABNORMAL LOW (ref >60)
GFR, EST NON AFRICAN AMERICAN: 14 mL/min — AB (ref >60)
Glucose, Bld: 103 mg/dL — ABNORMAL HIGH (ref 70–99)
Potassium: 5.2 mmol/L — ABNORMAL HIGH (ref 3.5–5.1)
Sodium: 136 mmol/L (ref 135–145)
TOTAL PROTEIN: 7 g/dL (ref 6.5–8.1)

## 2017-12-18 LAB — PHOSPHORUS
PHOSPHORUS: 4.6 mg/dL (ref 2.5–4.6)
Phosphorus: 4.6 mg/dL (ref 2.5–4.6)

## 2017-12-18 LAB — CBG MONITORING, ED
GLUCOSE-CAPILLARY: 96 mg/dL (ref 70–99)
GLUCOSE-CAPILLARY: 94 mg/dL (ref 70–99)
GLUCOSE-CAPILLARY: 64 mg/dL — AB (ref 70–99)
Glucose-Capillary: 107 mg/dL — ABNORMAL HIGH (ref 70–99)

## 2017-12-18 LAB — PROTIME-INR
INR: 3.76
Prothrombin Time: 36.9 seconds — ABNORMAL HIGH (ref 11.4–15.2)

## 2017-12-18 LAB — TROPONIN I
Troponin I: 0.18 ng/mL (ref <0.03)
Troponin I: 0.18 ng/mL (ref <0.03)

## 2017-12-18 LAB — PROCALCITONIN: PROCALCITONIN: 0.99 ng/mL

## 2017-12-18 LAB — LACTIC ACID, PLASMA
LACTIC ACID, VENOUS: 5.3 mmol/L — AB (ref 0.5–1.9)
Lactic Acid, Venous: 4 mmol/L (ref 0.5–1.9)

## 2017-12-18 LAB — I-STAT TROPONIN, ED: Troponin i, poc: 0.19 ng/mL (ref 0.00–0.08)

## 2017-12-18 MED ORDER — MIDAZOLAM HCL 2 MG/2ML IJ SOLN: 1.0000 mg | INTRAMUSCULAR | Status: DC | PRN

## 2017-12-18 MED ORDER — VITAL HIGH PROTEIN PO LIQD
1000.0000 mL | ORAL | Status: DC
  Administered 2017-12-18 – 2017-12-19 (×2): 1000 mL

## 2017-12-18 MED ORDER — FENTANYL 2500MCG IN NS 250ML (10MCG/ML) PREMIX INFUSION
25.0000 ug/h | INTRAVENOUS | Status: DC
  Administered 2017-12-18: 25 ug/h via INTRAVENOUS
  Filled 2017-12-18: qty 250

## 2017-12-18 MED ORDER — FENTANYL BOLUS VIA INFUSION
25.0000 ug | INTRAVENOUS | Status: DC | PRN
  Filled 2017-12-18: qty 25

## 2017-12-18 MED ORDER — SODIUM CHLORIDE 0.9 % IV SOLN
1.0000 g | Freq: Once | INTRAVENOUS | Status: AC
  Administered 2017-12-18: 1 g via INTRAVENOUS
  Filled 2017-12-18: qty 10

## 2017-12-18 MED ORDER — NALOXONE HCL 0.4 MG/ML IJ SOLN
INTRAMUSCULAR | Status: AC
  Filled 2017-12-18: qty 1

## 2017-12-18 MED ORDER — FENTANYL CITRATE (PF) 100 MCG/2ML IJ SOLN: 50.0000 ug | Freq: Once | INTRAMUSCULAR | Status: DC

## 2017-12-18 MED ORDER — RANOLAZINE ER 500 MG PO TB12
500.0000 mg | ORAL_TABLET | Freq: Two times a day (BID) | ORAL | Status: DC
  Administered 2017-12-18 – 2017-12-21 (×6): 500 mg via ORAL
  Filled 2017-12-18 (×7): qty 1

## 2017-12-18 MED ORDER — CHLORHEXIDINE GLUCONATE 0.12% ORAL RINSE (MEDLINE KIT)
15.0000 mL | Freq: Two times a day (BID) | OROMUCOSAL | Status: DC
  Administered 2017-12-18 – 2017-12-20 (×4): 15 mL via OROMUCOSAL

## 2017-12-18 MED ORDER — VANCOMYCIN HCL IN DEXTROSE 1-5 GM/200ML-% IV SOLN: 1000.0000 mg | Freq: Once | INTRAVENOUS | Status: DC

## 2017-12-18 MED ORDER — ETOMIDATE 2 MG/ML IV SOLN
INTRAVENOUS | Status: AC | PRN
  Administered 2017-12-18: 15 mg via INTRAVENOUS

## 2017-12-18 MED ORDER — PIPERACILLIN-TAZOBACTAM 3.375 G IVPB 30 MIN
3.3750 g | Freq: Once | INTRAVENOUS | Status: AC
  Administered 2017-12-18: 3.375 g via INTRAVENOUS
  Filled 2017-12-18: qty 50

## 2017-12-18 MED ORDER — ORAL CARE MOUTH RINSE
15.0000 mL | OROMUCOSAL | Status: DC
  Administered 2017-12-18 – 2017-12-20 (×18): 15 mL via OROMUCOSAL

## 2017-12-18 MED ORDER — NALOXONE HCL 0.4 MG/ML IJ SOLN
0.4000 mg | Freq: Once | INTRAMUSCULAR | Status: AC
Start: 2017-12-18 — End: 2017-12-18
  Administered 2017-12-18: 0.4 mg via INTRAVENOUS

## 2017-12-18 MED ORDER — PIPERACILLIN-TAZOBACTAM 3.375 G IVPB
3.3750 g | Freq: Two times a day (BID) | INTRAVENOUS | Status: DC
  Filled 2017-12-18: qty 50

## 2017-12-18 MED ORDER — PRO-STAT SUGAR FREE PO LIQD
30.0000 mL | Freq: Two times a day (BID) | ORAL | Status: DC
  Administered 2017-12-18 – 2017-12-20 (×5): 30 mL
  Filled 2017-12-18 (×5): qty 30

## 2017-12-18 MED ORDER — DEXTROSE 50 % IV SOLN
50.0000 mL | Freq: Once | INTRAVENOUS | Status: AC
  Administered 2017-12-18: 50 mL via INTRAVENOUS

## 2017-12-18 MED ORDER — DEXTROSE 50 % IV SOLN
25.0000 mL | Freq: Once | INTRAVENOUS | Status: DC
  Filled 2017-12-18: qty 50

## 2017-12-18 MED ORDER — SODIUM CHLORIDE 0.9 % IV SOLN: INTRAVENOUS | Status: DC

## 2017-12-18 MED ORDER — BISACODYL 10 MG RE SUPP: 10.0000 mg | Freq: Every day | RECTAL | Status: DC | PRN

## 2017-12-18 MED ORDER — DOCUSATE SODIUM 50 MG/5ML PO LIQD: 100.0000 mg | Freq: Two times a day (BID) | ORAL | Status: DC | PRN

## 2017-12-18 MED ORDER — FAMOTIDINE IN NACL 20-0.9 MG/50ML-% IV SOLN
20.0000 mg | Freq: Every day | INTRAVENOUS | Status: DC
  Administered 2017-12-18 – 2017-12-21 (×4): 20 mg via INTRAVENOUS
  Filled 2017-12-18 (×4): qty 50

## 2017-12-18 MED ORDER — WARFARIN - PHARMACIST DOSING INPATIENT
Freq: Every day | Status: DC
Start: 2017-12-18 — End: 2017-12-20

## 2017-12-18 MED ORDER — DEXTROSE-NACL 5-0.45 % IV SOLN
INTRAVENOUS | Status: DC
  Administered 2017-12-18: 13:00:00 via INTRAVENOUS

## 2017-12-18 MED ORDER — ROCURONIUM BROMIDE 50 MG/5ML IV SOLN
INTRAVENOUS | Status: AC | PRN
  Administered 2017-12-18: 80 mg via INTRAVENOUS

## 2017-12-18 MED ORDER — HEPARIN SODIUM (PORCINE) 5000 UNIT/ML IJ SOLN: 5000.0000 [IU] | Freq: Three times a day (TID) | INTRAMUSCULAR | Status: DC

## 2017-12-18 MED ORDER — MIDAZOLAM HCL 2 MG/2ML IJ SOLN
1.0000 mg | INTRAMUSCULAR | Status: DC | PRN
  Administered 2017-12-19: 1 mg via INTRAVENOUS
  Filled 2017-12-18: qty 2

## 2017-12-18 MED ORDER — CHLORHEXIDINE GLUCONATE CLOTH 2 % EX PADS
6.0000 | MEDICATED_PAD | Freq: Every day | CUTANEOUS | Status: DC
  Administered 2017-12-20 – 2017-12-21 (×2): 6 via TOPICAL

## 2017-12-18 MED ORDER — CHLORHEXIDINE GLUCONATE CLOTH 2 % EX PADS
6.0000 | MEDICATED_PAD | Freq: Every day | CUTANEOUS | Status: DC
  Administered 2017-12-18 – 2017-12-21 (×4): 6 via TOPICAL

## 2017-12-18 MED ORDER — VANCOMYCIN HCL IN DEXTROSE 1-5 GM/200ML-% IV SOLN
1000.0000 mg | INTRAVENOUS | Status: DC
  Administered 2017-12-19: 1000 mg via INTRAVENOUS
  Filled 2017-12-18: qty 200

## 2017-12-18 MED ORDER — SODIUM CHLORIDE 0.9 % IV BOLUS
500.0000 mL | Freq: Once | INTRAVENOUS | Status: AC
  Administered 2017-12-18: 500 mL via INTRAVENOUS

## 2017-12-18 MED ORDER — SODIUM CHLORIDE 0.9 % IV SOLN
2.0000 g | Freq: Once | INTRAVENOUS | Status: AC
  Administered 2017-12-18: 2 g via INTRAVENOUS
  Filled 2017-12-18: qty 2

## 2017-12-18 MED ORDER — FENTANYL CITRATE (PF) 100 MCG/2ML IJ SOLN
50.0000 ug | INTRAMUSCULAR | Status: DC | PRN
  Administered 2017-12-19: 50 ug via INTRAVENOUS
  Filled 2017-12-18: qty 2

## 2017-12-18 MED ORDER — SODIUM CHLORIDE 0.9 % IV SOLN: 250.0000 mL | INTRAVENOUS | Status: DC | PRN

## 2017-12-18 MED ORDER — SODIUM CHLORIDE 0.9 % IV SOLN
2.0000 g | INTRAVENOUS | Status: DC
  Administered 2017-12-19: 2 g via INTRAVENOUS
  Filled 2017-12-18 (×2): qty 2

## 2017-12-18 MED ORDER — MEXILETINE HCL 150 MG PO CAPS
150.0000 mg | ORAL_CAPSULE | Freq: Two times a day (BID) | ORAL | Status: DC
  Administered 2017-12-18 (×2): 150 mg via ORAL
  Filled 2017-12-18 (×3): qty 1

## 2017-12-18 MED ORDER — VANCOMYCIN HCL 10 G IV SOLR
1750.0000 mg | Freq: Once | INTRAVENOUS | Status: AC
  Administered 2017-12-18: 1750 mg via INTRAVENOUS
  Filled 2017-12-18: qty 1750

## 2017-12-18 NOTE — Progress Notes (Signed)
CRITICAL VALUE ALERT  Critical Value:  Trop 0.18  Date & Time Notied:  4159  Provider Notified: PA Desai  Orders Received/Actions taken: No orders at this time.

## 2017-12-18 NOTE — Progress Notes (Addendum)
Patrick [name] Progress Note   Subjective:  Unable to obtain history due to patient being intubated and sedated.   Per ED notes, patient was altered when he awoke this AM and was found to have as BS 19.  D/c from [facility] yesterday after admit for VT arrest.  Objective Vitals:   12/18/17 1415 12/18/17 1430 12/18/17 1445 12/18/17 1500  BP: 124/86 115/80 112/80 112/78  Pulse: 61 (!) 56 (!) 56 (!) 53  Resp: _0 (!) 9  Temp:      TempSrc:      SpO2: 100% 100% 100% 100%  Weight:      Height:       Physical Exam General:NAD, chronically ill appearing male, intubated Heart:bradycardia, regular rhythm, 2/6 systolic murmur Lungs:mostly CTAB anteriorly hard to appreciated due to current mental status Abdomen:soft, NTND, colostomy present Extremities:1+ LE edema b/l Dialysis Access: R IJ [facility]   Filed Weights   12/18/17 1400  Weight: 84 kg (185 lb 3 oz)    Intake/Output Summary (Last 24 hours) at 12/18/2017 1529 Last data filed at 12/18/2017 1501 Gross per 24 hour  Intake 130 ml  Output -  Net 130 ml    Additional Objective Labs: Basic Metabolic Panel: Recent Labs  Lab 12/16/17 0442 12/17/17 0032 12/18/17 1031 12/18/17 1042 12/18/17 1338  NA 135 132* 136 136  --   K 3.7 3.9 5.2* 5.2*  --   CL 96* 94* 98 98  --   CO2 27 24 20*  --   --   GLUCOSE 73 102* 103* 96  --   BUN 23 32* 25* 29*  --   CREATININE 4.27* 5.05* 4.10* 4.00*  --   CALCIUM 8.9 8.9 9.1  --   --   PHOS  --   --   --   --  4.6   Liver Function Tests: Recent Labs  Lab 12/18/17 1031  AST 458*  ALT 180*  ALKPHOS 138*  BILITOT 2.5*  PROT 7.0  ALBUMIN 2.7*   No results for input(s): LIPASE, AMYLASE in the last 168 hours. CBC: Recent Labs  Lab 12/14/17 0622 12/15/17 0513 12/16/17 0442 12/17/17 0032 12/18/17 1031 12/18/17 1042  WBC 6.1 4.3 6.7 8.1 7.8  --   NEUTROABS  --   --  5.0  --  6.5  --   HGB 9.4* 9.0* 9.2* 9.4* 10.5* 12.9*  HCT 30.8* 30.0* 30.5* 30.6* 36.5* 38.0*  MCV  87.3 89.6 88.4 87.2 93.6  --   PLT 145* PLATELET CLUMPS NOTED ON SMEAR, COUNT APPEARS DECREASED 122* 144* 87*  --    Blood Culture    Component Value Date/Time   SDES BLOOD LEFT HAND 12/07/2017 0418   SPECREQUEST  12/07/2017 0418    BOTTLES DRAWN AEROBIC AND ANAEROBIC Blood Culture adequate volume   CULT  12/07/2017 0418    NO GROWTH 5 DAYS Performed at [facility], [place] [address], [place], [place] [address]    REPTSTATUS 12/12/2017 FINAL 12/07/2017 0418    Cardiac Enzymes: Recent Labs  Lab 12/18/17 1338  TROPONINI 0.18*   CBG: Recent Labs  Lab 12/17/17 1500 12/18/17 0953 12/18/17 1035 12/18/17 1213 12/18/17 1244  GLUCAP 89 96 94 64* 107*   Iron Studies: No results for input(s): IRON, TIBC, TRANSFERRIN, FERRITIN in the last 72 hours. Lab Results  Component Value Date   INR 3.76 12/18/2017   INR 2.16 12/17/2017   INR 1.60 12/16/2017   Studies/Results: Ct Head Wo Contrast  Result Date: 12/18/2017 CLINICAL DATA:  67 year old male with altered level of consciousness. Initial encounter. EXAM: CT HEAD WITHOUT CONTRAST TECHNIQUE: Contiguous axial images were obtained from the base of the skull through the vertex without intravenous contrast. COMPARISON:  12/07/2017. FINDINGS: Brain: Motion degraded exam. No intracranial hemorrhage or CT evidence of large acute infarct. Moderate chronic microvascular changes. Atrophy. No intracranial mass lesion noted on this unenhanced exam. Vascular: Vascular calcifications Skull: No skull fracture detected. Sinuses/Orbits: No acute orbital abnormality. Mucosal thickening maxillary sinuses with polypoid configuration on the right. Mild mucosal thickening ethmoid sinus air cells. Other: Mastoid air cells and middle ear cavities are clear. IMPRESSION: Motion degraded examination. No intracranial hemorrhage or CT evidence of large acute infarct. Moderate chronic microvascular changes.  Atrophy. Maxillary sinus and ethmoid sinus mucosal  thickening. Electronically Signed   By: Genia Del M.D.   On: 12/18/2017 11:16   Dg Chest Portable 1 View  Result Date: 12/18/2017 CLINICAL DATA:  Line placement EXAM: PORTABLE CHEST 1 VIEW COMPARISON:  Chest x-ray from earlier same day. FINDINGS: Endotracheal tube is well positioned with tip approximately 2 cm above the carina. Enteric tube passes below the diaphragm. RIGHT IJ central catheter with tip projected over the RIGHT atrium, similar orientation to the previous exam. Stable cardiomegaly. LEFT basilar opacity is unchanged in the short-term interval, consistent with pneumonia, atelectasis and/or pleural effusion. No pneumothorax seen. IMPRESSION: 1. Endotracheal tube is now in place with tip well positioned just above the level of the carina. 2. Enteric tube passes below the diaphragm. 3. Stable opacity at the LEFT lung base, unchanged in the short-term interval, consistent with pneumonia, atelectasis and/or pleural effusion. 4. No pneumothorax. Electronically Signed   By: Franki Cabot M.D.   On: 12/18/2017 11:48   Dg Chest Port 1 View  Result Date: 12/18/2017 CLINICAL DATA:  Code sepsis.  Altered mental status. EXAM: PORTABLE CHEST 1 VIEW COMPARISON:  12/16/2017 and prior radiographs FINDINGS: New peripheral LEFT basilar opacity now noted and may represent consolidation, atelectasis and/or effusion. Cardiomegaly and pulmonary vascular congestion again identified. RIGHT basilar atelectasis/airspace disease is unchanged. There is no evidence of pneumothorax. A RIGHT IJ central venous catheter with tip overlying the LOWER cavoatrial junction noted. IMPRESSION: New peripheral LEFT basilar opacity which may represent consolidation, atelectasis and/or effusion. Cardiomegaly and pulmonary vascular congestion. Electronically Signed   By: Margarette Canada M.D.   On: 12/18/2017 10:36    Medications: . calcium gluconate 1 g (12/18/17 1458)  . ceFEPime (MAXIPIME) IV    . [START ON 12/19/2017] ceFEPime  (MAXIPIME) IV    . dextrose 5 % and 0.45% NaCl 50 mL/hr at 12/18/17 1326  . famotidine (PEPCID) IV 20 mg (12/18/17 1402)  . [START ON 12/19/2017] vancomycin     . chlorhexidine gluconate (MEDLINE KIT)  15 mL Mouth Rinse BID  . Chlorhexidine Gluconate Cloth  6 each Topical Daily  . feeding supplement (PRO-STAT SUGAR FREE 64)  30 mL Per Tube BID  . feeding supplement (VITAL HIGH PROTEIN)  1,000 mL Per Tube Q24H  . fentaNYL (SUBLIMAZE) injection  50 mcg Intravenous Once  . mouth rinse  15 mL Mouth Rinse 10 times per day  . mexiletine  150 mg Oral BID  . naloxone      . ranolazine  500 mg Oral BID  . Warfarin - Pharmacist Dosing Inpatient   Does not apply q1800    Dialysis Orders: [facility] (recent start) 4h 400/800 79kg 3K/ 2Ca bath Hep 2700 R IJ  TDC - mircera 75 every 2 wks last 6/27  Home meds: - coumadin 61m hs/ lipitor 10 mg hs/ lopressor 12.5 bid/ mexilitene 150 bid  - allopurinol 300 qd/ protonix 40 qd/ sl ntg prn/ mvi - wellbutrin 1014mqd/ baclofen 5 mg tid prn/ percocet prn/ remeron 7.5 hs - ranexa 500 bid  Assessment/Plan: 1. AMS/ VDRF - hypoglycemia, EEG pending. Ct unremarkable. Lactic acid 7.85. CXR shows new L basilar opacity likely to represent pneumonia. ABX started. Per primary 2. ESRD - TTS HD. Orders written for HD tomorrow. K 5.2. 3. Anemia of CKD- Hgb 12.9. No indication for ESA at this time. 4. Secondary hyperparathyroidism - Ca and phos at goal.  5. HTN/volume - BP well controlled, does not appear volume overloaded at this time. Titrate down volume as tolerated.  6. Nutrition - renal diet with fluid restrictions once advanced. 7. Hx A. Fib - per primary 8. Recent VT arrest 9. Hx colon cancer 10. Hx depression 11. Hx gout    LiJen MowPA-C CaKentuckyidney [name] Pager: [phone]/22/2019,3:29 PM  LOS: 0 days   Pt seen, examined and agree w A/P as above.  RoKelly SplinterD CaNewell Rubbermaidager [phone]  12/18/2017, 4:06 PM

## 2017-12-18 NOTE — ED Notes (Signed)
Blood draw attempt unsuccessfully.

## 2017-12-18 NOTE — Progress Notes (Signed)
Pharmacy Antibiotic Note  Patrick [name] is a 67 y.o. male admitted on 12/18/2017 with sepsis.  Patient afebrile, labs pending, patient found hypoglycemic. Pharmacy has been consulted for Vancomycin + Zosyn dosing.  Plan: Vancomycin LD 1750mg   Vancomycin 1000mg  IV post hemodialysis TTS.  Pre-HD goal: 15-25 mcg/mL. Post-HD level 5-15 mcg/mL.  Zosyn 3.375g IV q8h (4 hour infusion)     Temp (24hrs), Avg:96.7 F (35.9 C), Min:96.1 F (35.6 C), Max:97.3 F (36.3 C)  Recent Labs  Lab 12/12/17 0650 12/13/17 0351 12/14/17 0622 12/14/17 1058 12/15/17 0513 12/16/17 0442 12/17/17 0032  WBC 5.8 6.2 6.1  --  4.3 6.7 8.1  CREATININE 5.92*  --   --  5.34* 3.30* 4.27* 5.05*    Estimated Creatinine Clearance: 14.1 mL/min (A) (by C-G formula based on SCr of 5.05 mg/dL (H)).    Allergies  Allergen Reactions  . Nsaids     Kidney disease    Antimicrobials this admission: Vancomycin 7/22 >>  Zosyn  7/22 >>   Dose adjustments this admission: N/A   Microbiology results: Pending  Thank you for allowing pharmacy to be a part of this patient's care.  Abelardo Diesel Tessin 12/18/2017 10:29 AM

## 2017-12-18 NOTE — Procedures (Signed)
ELECTROENCEPHALOGRAM REPORT   Patient: Patrick [name]       Room #: [facility] EEG No. ID: [id number] Age: 67 y.o.        Sex: male Referring Physician: Halford Chessman Report Date:  12/18/2017        Interpreting Physician: Alexis Goodell  History: Patrick [name] is an 67 y.o. male s/p VT arrest with altered mental status  Medications:  Maxipime, Pepcid, Fentanyl, Mexitil, Ranexa, Vancomycin  Conditions of Recording:  This is a 21 channel routine scalp EEG performed with bipolar and monopolar montages arranged in accordance to the international 10/20 system of electrode placement. One channel was dedicated to EKG recording.  The patient in the intubated and sedated state.  Description:  The background activity consists of a mixture of low voltage, poorly organized, theta and delta activity.  This activity is continuous and diffusely distributed.  The patient has multiple episodes of tongue twitching during the recording.  There is no change in the background rhythm when these occur.  No epileptiform activity is noted.   Hyperventilation and intermittent photic stimulation were not performed.  IMPRESSION: This EEG is characterized by slowing which is consistent with normal drowse.  Can not rule out the possibility of slowing related to general cerebral disturbance such as a metabolic encephalopathy or medication effect.  Clinical correlation recommended.  No epileptiform activity is noted despite multiple episodes of tongue twitching.         Alexis Goodell, MD Neurology [phone] 12/18/2017, 5:06 PM

## 2017-12-18 NOTE — Progress Notes (Signed)
ANTICOAGULATION CONSULT NOTE - Initial Consult  Pharmacy Consult for warfarin Indication: atrial fibrillation  Allergies  Allergen Reactions  . Nsaids     Kidney disease    Patient Measurements:    Vital Signs: Temp: 97.5 F (36.4 C) (07/22 1245) Temp Source: Oral (07/22 1245) BP: 123/101 (07/22 1400) Pulse Rate: 70 (07/22 1400)  Labs: Recent Labs    12/16/17 0442 12/17/17 0000 12/17/17 0032 12/17/17 0647 12/18/17 1031 12/18/17 1042 12/18/17 1338  HGB 9.2*  --  9.4*  --  10.5* 12.9*  --   HCT 30.5*  --  30.6*  --  36.5* 38.0*  --   PLT 122*  --  144*  --  87*  --   --   LABPROT 18.9*  --  24.0*  --   --   --  36.9*  INR 1.60  --  2.16  --   --   --  3.76  HEPARINUNFRC  --  0.37  --  0.50  --   --   --   CREATININE 4.27*  --  5.05*  --  4.10* 4.00*  --     Estimated Creatinine Clearance: 17.8 mL/min (A) (by C-G formula based on SCr of 4 mg/dL (H)).   Medical History: Past Medical History:  Diagnosis Date  . Arthritis    "right hand; right knee" (06/19/2014)  . CAD (coronary artery disease)    2v CAD with subtotal occlusion of small co-dominant RCA and borderline lesion in moderate-sized OM-1  . Chronic systolic (congestive) heart failure ([place])    Preserved EF 2016, has been between 25% and 45% since then  . CKD (chronic kidney disease), stage III ([place])   . Coronary artery disease involving native coronary artery of native heart with angina pectoris ([place]) 09/11/2016  . Degenerative joint disease of knee, right   . Dyspnea   . Dysrhythmia   . Enlarged prostate   . Hyperlipidemia   . Hypertension   . Pneumonia 05/2014  . Prediabetes 10/03/2014  . Scrotal edema 04/03/2015  . SMALL BOWEL OBSTRUCTION, HX OF 08/21/2007   Annotation: with narrowing in the ileocecal region Qualifier: Diagnosis of  By: Hassell Done FNP, Tori Milks      Assessment: 40 yoM on warfarin PTA for hx of atrial fibrillation admitted with AMS and respiratory distress. Pharmacy consulted to resume  warfarin. INR on admit elevated at 3.76, last dose of warfarin unknown at this time. CT head negative for acute process, CBC appears at baseline.  Goal of Therapy:  INR 2-3 Monitor platelets by anticoagulation protocol: Yes   Plan:  -Hold warfarin tonight -Check INR tomorrow morning -May need to consider heparin drip depending on clinical status  Arrie Senate, PharmD, BCPS Clinical Pharmacist [phone] Please check AMION for all [place] numbers 12/18/2017

## 2017-12-18 NOTE — Progress Notes (Signed)
EEG completed, results pending. 

## 2017-12-18 NOTE — Progress Notes (Signed)
Pharmacy Antibiotic Note  Patrick [name] is a 67 y.o. male admitted on 12/18/2017 with sepsis. Pharmacy has been consulted for Vancomycin + cefepime dosing. Pt is hypothermic and WBC is WNL. Lactic acid is significantly elevated.   Plan: Continue vancomycin as ordered Cefepime 2gm IV x 1 then 2gm post-HD F/u renal plans, C&S, clinical status and pre-HD vanc levels PRN     Temp (24hrs), Avg:96.8 F (36 C), Min:96.1 F (35.6 C), Max:97.5 F (36.4 C)  Recent Labs  Lab 12/14/17 0622  12/15/17 0513 12/16/17 0442 12/17/17 0032 12/18/17 1031 12/18/17 1042  WBC 6.1  --  4.3 6.7 8.1 7.8  --   CREATININE  --    < > 3.30* 4.27* 5.05* 4.10* 4.00*  LATICACIDVEN  --   --   --   --   --   --  7.85*   < > = values in this interval not displayed.    Estimated Creatinine Clearance: 17.8 mL/min (A) (by C-G formula based on SCr of 4 mg/dL (H)).    Allergies  Allergen Reactions  . Nsaids     Kidney disease    Antimicrobials this admission: Cefepime 7/22>> Vancomycin 7/22 >>  Zosyn x 1 7/22   Dose adjustments this admission: N/A   Microbiology results: Pending  Thank you for allowing pharmacy to be a part of this patient's care.  Shantoria Ellwood, Rande Lawman 12/18/2017 12:46 PM

## 2017-12-18 NOTE — ED Notes (Signed)
Lab results was reported to Nurse Clark. 

## 2017-12-18 NOTE — ED Triage Notes (Signed)
Patient arrives with [facility] from [facility], called out for patient being unresponsive. EMS found patient  CBG 19 on arrival, given 1mg  glucagon and 25mg  D10, report recheck CBG 22. Patient CBG 96 on arrival. Patient arrives on NRB. Lethargic but responding to verbal stimuli. BP 78/67.

## 2017-12-18 NOTE — Progress Notes (Signed)
RT NOTE: ABG obtained per order. No critical results logged.

## 2017-12-18 NOTE — H&P (Signed)
PULMONARY / CRITICAL CARE MEDICINE   Name: Patrick [name] MRN: [id number] DOB: [birth year]    ADMISSION DATE:  12/18/2017 CONSULTATION DATE:  12/18/17  REFERRING MD:  Ellender Hose  CHIEF COMPLAINT:  AMS  HISTORY OF PRESENT ILLNESS:  Pt is encephelopathic; therefore, this HPI is obtained from chart review. Patrick [name] is a 67 y.o. male with PMH as outlined below and who resides in a nursing home. Had recent admission 12/07/17 through 12/17/17 for V.fib arrest (had cardiac cath that showed obstructive CAD with 70% occlusion first diagonal, 70% first O, 90% left PDA, 100% non-dominant RCA, normal LVEDP).  Discharged back to nursing home with repeat labs in a few weeks.  Presented to [facility] ED 7/22 after staff at facility were unable to arouse him.  CBG was noted to be 19.  He was given 1g glucagon and 25mg  D10 with some improvement.  In ED, he had GCS fluctuating from 6 - 9; therefore, he was intubated. CXR concerning for HCAP and SBP low in 70s and 80s but responded to IVF's.  PAST MEDICAL HISTORY :  He  has a past medical history of Arthritis, CAD (coronary artery disease), Chronic systolic (congestive) heart failure ([place]), CKD (chronic kidney disease), stage III ([place]), Coronary artery disease involving native coronary artery of native heart with angina pectoris ([place]) (09/11/2016), Degenerative joint disease of knee, right, Dyspnea, Dysrhythmia, Enlarged prostate, Hyperlipidemia, Hypertension, Pneumonia (05/2014), Prediabetes (10/03/2014), Scrotal edema (04/03/2015), and SMALL BOWEL OBSTRUCTION, HX OF (08/21/2007).  PAST SURGICAL HISTORY: He  has a past surgical history that includes Inguinal hernia repair (Left, 1990's); Cardiac catheterization (N/A, 02/16/2016); Colonoscopy (N/A, 04/01/2016); ir generic historical (07/05/2016); XI abdominal perineal resection (N/A, 09/28/2016); IR Radiologist Eval & Mgmt (03/29/2017); IR Fluoro Guide CV Line Right (10/18/2017); IR US Guide Vasc Access Right (10/18/2017); and LEFT HEART CATH  AND CORONARY ANGIOGRAPHY (N/A, 12/13/2017).  Allergies  Allergen Reactions  . Nsaids     Kidney disease    No current facility-administered medications on file prior to encounter.    Current Outpatient Medications on File Prior to Encounter  Medication Sig  . acetaminophen (TYLENOL) 500 MG tablet Take 1,000 mg by mouth every 6 (six) hours as needed (for pain/fever/headaches.).   Marland Kitchen allopurinol (ZYLOPRIM) 300 MG tablet take 1 tablet by mouth once daily  . Amino Acids-Protein Hydrolys (FEEDING SUPPLEMENT, PRO-STAT SUGAR FREE 64,) LIQD Take 30 mLs by mouth 3 (three) times daily with meals.  Marland Kitchen atorvastatin (LIPITOR) 10 MG tablet Take 10 mg by mouth at bedtime.   . Baclofen 5 MG TABS Take 5 mg by mouth 3 (three) times daily as needed for muscle spasms.  Marland Kitchen buPROPion (WELLBUTRIN) 100 MG tablet take 1 tablet by mouth twice a day  . Melatonin 5 MG TABS Take 5 mg by mouth at bedtime.  . metoprolol tartrate (LOPRESSOR) 25 MG tablet Take 0.5 tablets (12.5 mg total) by mouth 2 (two) times daily.  Marland Kitchen mexiletine (MEXITIL) 150 MG capsule Take 1 capsule (150 mg total) by mouth 2 (two) times daily.  . mirtazapine (REMERON) 7.5 MG tablet Take 1 tablet (7.5 mg total) by mouth at bedtime.  . Multiple Vitamins-Minerals (MULTIVITAMIN WITH MINERALS) tablet Take 1 tablet by mouth daily. Men's 50+ Multivitamin  . Nutritional Supplements (FEEDING SUPPLEMENT, NEPRO CARB STEADY,) LIQD Take 237 mLs by mouth 3 (three) times daily between meals.  Marland Kitchen oxyCODONE-acetaminophen (PERCOCET/ROXICET) 5-325 MG tablet Take 1 tablet by mouth every 6 (six) hours as needed for severe pain.  . pantoprazole (PROTONIX)  40 MG tablet Take 1 tablet (40 mg total) by mouth daily.  . ranolazine (RANEXA) 500 MG 12 hr tablet Take 1 tablet (500 mg total) by mouth 2 (two) times daily.  Marland Kitchen warfarin (COUMADIN) 2 MG tablet Take 1 tablet (2 mg total) by mouth daily at 6 PM.    FAMILY HISTORY:  His indicated that his mother is deceased. He indicated  that his father is deceased. He indicated that his sister is alive. He indicated that his maternal grandmother is deceased. He indicated that his maternal grandfather is deceased. He indicated that his paternal grandmother is deceased. He indicated that his paternal grandfather is deceased.   SOCIAL HISTORY: He  reports that he has quit smoking. His smoking use included pipe and cigars. He quit after 5.00 years of use. He has never used smokeless tobacco. He reports that he does not drink alcohol or use drugs.  REVIEW OF SYSTEMS:  Unable to obtain as pt is encephalopathic.  SUBJECTIVE:  On vent, sedated.  VITAL SIGNS: BP (!) 111/93   Pulse 78   Temp (!) 96.1 F (35.6 C) (Temporal)   Resp 16   SpO2 100%   HEMODYNAMICS:    VENTILATOR SETTINGS: Vent Mode: PRVC FiO2 (%):  [40 %] 40 % Set Rate:  [16 bmp] 16 bmp Vt Set:  [530 mL] 530 mL PEEP:  [5 cmH20] 5 cmH20 Plateau Pressure:  [18 cmH20] 18 cmH20  INTAKE / OUTPUT: No intake/output data recorded.   PHYSICAL EXAMINATION: General: Elderly male, chronically ill appearing, resting in bed, in NAD. Neuro: Sedated, unresponsive. HEENT: [place]/AT. Sclerae anicteric. Cardiovascular: RRR, no M/R/G.  Lungs: Respirations even and unlabored.  CTA bilaterally, No W/R/R. Abdomen: Ostomy bag in place.  BS x 4, soft, NT/ND.  Musculoskeletal: No gross deformities, no edema.  Skin: Warm, dry, no rashes.   LABS:  BMET Recent Labs  Lab 12/16/17 0442 12/17/17 0032 12/18/17 1031 12/18/17 1042  NA 135 132* 136 136  K 3.7 3.9 5.2* 5.2*  CL 96* 94* 98 98  CO2 27 24 20*  --   BUN 23 32* 25* 29*  CREATININE 4.27* 5.05* 4.10* 4.00*  GLUCOSE 73 102* 103* 96    Electrolytes Recent Labs  Lab 12/15/17 0513 12/16/17 0442 12/17/17 0032 12/18/17 1031  CALCIUM 8.7* 8.9 8.9 9.1  MG 1.7 1.7 1.7  --     CBC Recent Labs  Lab 12/16/17 0442 12/17/17 0032 12/18/17 1031 12/18/17 1042  WBC 6.7 8.1 7.8  --   HGB 9.2* 9.4* 10.5* 12.9*  HCT  30.5* 30.6* 36.5* 38.0*  PLT 122* 144* PENDING  --     Coag's Recent Labs  Lab 12/15/17 0513 12/16/17 0442 12/17/17 0032  INR 1.59 1.60 2.16    Sepsis Markers Recent Labs  Lab 12/18/17 1042  LATICACIDVEN 7.85*    ABG Recent Labs  Lab 12/18/17 1043  PHART 7.350  PCO2ART 36.2  PO2ART 199.0*    Liver Enzymes Recent Labs  Lab 12/18/17 1031  AST 458*  ALT 180*  ALKPHOS 138*  BILITOT 2.5*  ALBUMIN 2.7*    Cardiac Enzymes No results for input(s): TROPONINI, PROBNP in the last 168 hours.  Glucose Recent Labs  Lab 12/17/17 0732 12/17/17 1411 12/17/17 1438 12/17/17 1500 12/18/17 0953 12/18/17 1035  GLUCAP 85 61* 68* 89 96 94    Imaging Ct Head Wo Contrast  Result Date: 12/18/2017 CLINICAL DATA:  67 year old male with altered level of consciousness. Initial encounter. EXAM: CT HEAD WITHOUT CONTRAST TECHNIQUE:  Contiguous axial images were obtained from the base of the skull through the vertex without intravenous contrast. COMPARISON:  12/07/2017. FINDINGS: Brain: Motion degraded exam. No intracranial hemorrhage or CT evidence of large acute infarct. Moderate chronic microvascular changes. Atrophy. No intracranial mass lesion noted on this unenhanced exam. Vascular: Vascular calcifications Skull: No skull fracture detected. Sinuses/Orbits: No acute orbital abnormality. Mucosal thickening maxillary sinuses with polypoid configuration on the right. Mild mucosal thickening ethmoid sinus air cells. Other: Mastoid air cells and middle ear cavities are clear. IMPRESSION: Motion degraded examination. No intracranial hemorrhage or CT evidence of large acute infarct. Moderate chronic microvascular changes.  Atrophy. Maxillary sinus and ethmoid sinus mucosal thickening. Electronically Signed   By: Genia Del M.D.   On: 12/18/2017 11:16   Dg Chest Port 1 View  Result Date: 12/18/2017 CLINICAL DATA:  Code sepsis.  Altered mental status. EXAM: PORTABLE CHEST 1 VIEW COMPARISON:   12/16/2017 and prior radiographs FINDINGS: New peripheral LEFT basilar opacity now noted and may represent consolidation, atelectasis and/or effusion. Cardiomegaly and pulmonary vascular congestion again identified. RIGHT basilar atelectasis/airspace disease is unchanged. There is no evidence of pneumothorax. A RIGHT IJ central venous catheter with tip overlying the LOWER cavoatrial junction noted. IMPRESSION: New peripheral LEFT basilar opacity which may represent consolidation, atelectasis and/or effusion. Cardiomegaly and pulmonary vascular congestion. Electronically Signed   By: Margarette Canada M.D.   On: 12/18/2017 10:36     STUDIES:  CT head 7/22 > no hemorrhage or acute infarct.  Moderate chronic microvascular changes.  CULTURES: Blood 7/22 >  Urine 7/22 >  Sputum 7/22 >   ANTIBIOTICS: Vanc 7/22 >  Cefepime 7/22 >   SIGNIFICANT EVENTS: 7/11 through 7/21 > admit. 7/22 > re-admitted.  LINES/TUBES: ETT 7/22 >   DISCUSSION: 67 y.o. male with recent admit for V.Fib arrest, discharged 7/21 to nursing home.  Re-admitted 7/22 with AMS due to hypoglycemia. Required intubation in ED for airway protection given poor GCS.  ASSESSMENT / PLAN:  PULMONARY A: Respiratory insufficiency - with inability to protect the airway; s/p intubation. Possible HCAP. P:   Full vent support. Wean as able. VAP prevention measures. SBT in AM if able. Empiric abx per ID section. CXR in AM.  CARDIOVASCULAR A:  Hx VF arrest HTN, HLD, dysrhythmia, obstructive CAD (cath from July with 70% occlusion first diagonal, 70% first O, 90% left PDA, 100% non-dominant RCA, normal LVEDP). Troponin bump - suspect demand. P:  Heparin gtt in lieu of preadmission warfarin. Hold preadmission ranolazine, mexitil, atorvastatin, lopressor.  RENAL A:   ESRD - on HD TTS. Hypocalcemia. P:   Will notify nephrology of admission. 1g Ca gluconate. BMP in AM.  GASTROINTESTINAL A:   GI prophylaxis. Nutrition. P:    SUP: Pantoprazole. NPO.  HEMATOLOGIC A:   VTE Prophylaxis. P:  SCD's / heparin gtt. CBC in AM.  INFECTIOUS A:   Possible HCAP. P:   Abx as above (vanc / cefepime).  Follow cultures as above. PCT algorithm to limit abx exposure.  ENDOCRINE A:   Hypoglycemia - s/p 1g glucagon, 25mg  D10.   P:   CBG q4hrs.  NEUROLOGIC A:   Sedation needs due to mechanical ventilation. Hx DJD, arthritis. P:   Sedation:  Fentanyl gtt / Midazolam PRN. RASS goal: 0 to -1. Daily WUA. Hold preadmission baclofen, bupropion, mirtazapine, percocet.  Family updated: None available.  Interdisciplinary Family Meeting v Palliative Care Meeting:  Due by: 12/25/17.  CC time: 35 min.   Montey Hora, [place] -  Loletha Grayer [facility] Pager: [phone]  or [phone] 12/18/2017, 12:23 PM

## 2017-12-18 NOTE — ED Provider Notes (Signed)
[facility] EMERGENCY DEPARTMENT Provider Note   CSN: [id number] Arrival date & time: 12/18/17  8101     History   Chief Complaint Chief Complaint  Patient presents with  . Altered Mental Status    HPI Patrick [name] is a 67 y.o. male.  HPI 67 year old male status post recent admission for V. fib arrest complicated by respiratory failure here with altered mental status.  The patient reports he was in his usual state of health upon going to bed last night.  He was just recently discharged.  Upon awakening this morning, the facility was essentially unable to wake him.  His blood sugar dropped to 19.  MS was called.  He was given some D50.  He is been intermittently responsive in route.  On arrival here, he is minimally responsive to painful stimuli.  Occasionally, he is able to tell me his name, but not where he is.   Level 5 caveat invoked as remainder of history, ROS, and physical exam limited due to patient's AMS.   Past Medical History:  Diagnosis Date  . Arthritis    "right hand; right knee" (06/19/2014)  . CAD (coronary artery disease)    2v CAD with subtotal occlusion of small co-dominant RCA and borderline lesion in moderate-sized OM-1  . Chronic systolic (congestive) heart failure ([place])    Preserved EF 2016, has been between 25% and 45% since then  . CKD (chronic kidney disease), stage III ([place])   . Coronary artery disease involving native coronary artery of native heart with angina pectoris ([place]) 09/11/2016  . Degenerative joint disease of knee, right   . Dyspnea   . Dysrhythmia   . Enlarged prostate   . Hyperlipidemia   . Hypertension   . Pneumonia 05/2014  . Prediabetes 10/03/2014  . Scrotal edema 04/03/2015  . SMALL BOWEL OBSTRUCTION, HX OF 08/21/2007   Annotation: with narrowing in the ileocecal region Qualifier: Diagnosis of  By: Hassell Done FNP, Tori Milks      Patient Active Problem List   Diagnosis Date Noted  . Malnutrition of moderate degree  12/16/2017  . Respiratory arrest ([place])   . VT (ventricular tachycardia) ([place]) 12/07/2017  . Shock circulatory ([place]) 10/06/2017  . Acute renal disease   . Compromised airway   . Acute respiratory failure with hypoxia ([place])   . Community acquired bacterial pneumonia 05/24/2017  . Renal cell carcinoma, left ([place]) 05/24/2017  . Liver cirrhosis ([place]) 05/24/2017  . Pneumonia 05/23/2017  . Balance problem 03/07/2017  . Pulmonary HTN ([place]) 11/10/2016  . History of resection of rectum 09/28/2016  . Rectal cancer ([place]) 09/28/2016  . Atrial fibrillation ([place]) 09/11/2016  . Coronary artery disease involving native coronary artery of native heart with angina pectoris ([place]) 09/11/2016  . Nonischemic cardiomyopathy ([place]) 09/11/2016  . Thrombocytopenia ([place]) 05/29/2016  . Adenocarcinoma of rectum s/p robotic APR/colostomy 09/28/2016 04/14/2016  . Chronic kidney disease (CKD), stage III (moderate) ([place]) 02/05/2016  . Abnormal finding on cardiovascular stress test 02/05/2016  . Cardiomyopathy, ischemic 06/17/2015  . Acute on chronic combined systolic and diastolic CHF (congestive heart failure) ([place]) 06/17/2015  . Major depressive disorder, recurrent episode ([place]) 04/17/2015  . Hypokalemia 08/29/2014  . Other iron deficiency anemias   . Elevated troponin I measurement   . Encounter for central line placement 03/11/2014  . Obesity, unspecified 10/03/2013  . Dyslipidemia 08/13/2013  . MACULAR DEGENERATION 05/02/2008  . ERECTILE DYSFUNCTION 03/13/2007  . Essential hypertension 01/16/2007  . BPH (benign prostatic hyperplasia) 01/16/2007  Past Surgical History:  Procedure Laterality Date  . CARDIAC CATHETERIZATION N/A 02/16/2016   Procedure: Right/Left Heart Cath and Coronary Angiography;  Surgeon: Jolaine Artist, MD;  Location: [facility];  Service: Cardiovascular;  Laterality: N/A;  . COLONOSCOPY N/A 04/01/2016   Procedure: COLONOSCOPY;  Surgeon: Leighton Ruff, MD;  Location: [facility]  ENDOSCOPY;  Service: Endoscopy;  Laterality: N/A;  . INGUINAL HERNIA REPAIR Left 1990's  . IR FLUORO GUIDE CV LINE RIGHT  10/18/2017  . IR GENERIC HISTORICAL  07/05/2016   IR RADIOLOGIST EVAL & MGMT 07/05/2016 Corrie Mckusick, DO [facility] INTERV RAD  . IR RADIOLOGIST EVAL & MGMT  03/29/2017  . IR US GUIDE VASC ACCESS RIGHT  10/18/2017  . LEFT HEART CATH AND CORONARY ANGIOGRAPHY N/A 12/13/2017   Procedure: LEFT HEART CATH AND CORONARY ANGIOGRAPHY;  Surgeon: Martinique, Peter M, MD;  Location: [facility] CV LAB;  Service: Cardiovascular;  Laterality: N/A;  . XI ROBOT ABDOMINAL PERINEAL RESECTION N/A 09/28/2016   Procedure: XI ROBOT ABDOMINOPERINEAL RESECTION WITH PERMANENT COLOSTOMY ERAS PATHWAY;  Surgeon: Michael Boston, MD;  Location: [facility] ORS;  Service: General;  Laterality: N/A;        Home Medications    Prior to Admission medications   Medication Sig Start Date End Date Taking? Authorizing Provider  acetaminophen (TYLENOL) 500 MG tablet Take 1,000 mg by mouth every 6 (six) hours as needed (for pain/fever/headaches.).     [provider]  allopurinol (ZYLOPRIM) 300 MG tablet take 1 tablet by mouth once daily 04/03/17   Mayo, Pete Pelt, MD  Amino Acids-Protein Hydrolys (FEEDING SUPPLEMENT, PRO-STAT SUGAR FREE 64,) LIQD Take 30 mLs by mouth 3 (three) times daily with meals. 12/17/17   Roxan Hockey, MD  atorvastatin (LIPITOR) 10 MG tablet Take 10 mg by mouth at bedtime.  08/19/17   [provider]  Baclofen 5 MG TABS Take 5 mg by mouth 3 (three) times daily as needed for muscle spasms. 10/27/17   Bonnielee Haff, MD  buPROPion [facility]) 100 MG tablet take 1 tablet by mouth twice a day 03/14/17   Tonette Bihari, MD  Melatonin 5 MG TABS Take 5 mg by mouth at bedtime.    [provider]  metoprolol tartrate (LOPRESSOR) 25 MG tablet Take 0.5 tablets (12.5 mg total) by mouth 2 (two) times daily. 12/17/17 12/17/18  Roxan Hockey, MD  mexiletine (MEXITIL) 150 MG capsule Take 1  capsule (150 mg total) by mouth 2 (two) times daily. 12/17/17   Roxan Hockey, MD  mirtazapine (REMERON) 7.5 MG tablet Take 1 tablet (7.5 mg total) by mouth at bedtime. 12/17/17   Roxan Hockey, MD  Multiple Vitamins-Minerals (MULTIVITAMIN WITH MINERALS) tablet Take 1 tablet by mouth daily. Men's 50+ Multivitamin 12/17/17   Roxan Hockey, MD  Nutritional Supplements (FEEDING SUPPLEMENT, NEPRO CARB STEADY,) LIQD Take 237 mLs by mouth 3 (three) times daily between meals. 12/17/17   Roxan Hockey, MD  oxyCODONE-acetaminophen (PERCOCET/ROXICET) 5-325 MG tablet Take 1 tablet by mouth every 6 (six) hours as needed for severe pain. 12/17/17   Roxan Hockey, MD  pantoprazole (PROTONIX) 40 MG tablet Take 1 tablet (40 mg total) by mouth daily. 10/27/17   Bonnielee Haff, MD  ranolazine (RANEXA) 500 MG 12 hr tablet Take 1 tablet (500 mg total) by mouth 2 (two) times daily. 12/17/17   Roxan Hockey, MD  warfarin (COUMADIN) 2 MG tablet Take 1 tablet (2 mg total) by mouth daily at 6 PM. 12/17/17   Roxan Hockey, MD  Family History Family History  Problem Relation Age of Onset  . Hypertension Mother   . Diabetes Mother   . Stroke Mother   . Cancer Father 73       Prostate  . Dementia Father   . COPD Sister   . Arthritis Sister   . Edema Sister     Social History Social History   Tobacco Use  . Smoking status: Former Smoker    Years: 5.00    Types: Pipe, Landscape architect  . Smokeless tobacco: Never Used  . Tobacco comment: 06/19/2014 "stopped smoking in ~ 2014; used to smoke a pipe or cigar a couple times/month"  Substance Use Topics  . Alcohol use: No  . Drug use: No     Allergies   Nsaids   Review of Systems Review of Systems  Unable to perform ROS: Mental status change  Psychiatric/Behavioral: Positive for confusion.     Physical Exam Updated Vital Signs BP (!) 111/93   Pulse 78   Temp (!) 96.1 F (35.6 C) (Temporal)   Resp 16   SpO2 100%   Physical Exam    Constitutional: He appears well-developed. He appears lethargic. He appears ill. He appears distressed.  HENT:  Head: Normocephalic and atraumatic.  Markedly poor dentition  Eyes: Conjunctivae are normal.  Neck: Neck supple.  Cardiovascular: Normal rate, regular rhythm and normal heart sounds. Exam reveals no friction rub.  No murmur heard. Pulmonary/Chest: Effort normal. Tachypnea noted. No respiratory distress. He has decreased breath sounds. He has no wheezes. He has rales in the right lower field, the left middle field and the left lower field.  Abdominal: He exhibits no distension.  Musculoskeletal: He exhibits no edema.  Neurological: He appears lethargic. He is disoriented. He exhibits normal muscle tone. GCS eye subscore is 2. GCS verbal subscore is 2. GCS motor subscore is 4.  Skin: Skin is warm. Capillary refill takes less than 2 seconds.  Psychiatric: He has a normal mood and affect.  Nursing note and vitals reviewed.    ED Treatments / Results  Labs (all labs ordered are listed, but only abnormal results are displayed) Labs Reviewed  CBC WITH DIFFERENTIAL/PLATELET - Abnormal; Notable for the following components:      Result Value   RBC 3.90 (*)    Hemoglobin 10.5 (*)    HCT 36.5 (*)    MCHC 28.8 (*)    RDW 21.4 (*)    All other components within normal limits  COMPREHENSIVE METABOLIC PANEL - Abnormal; Notable for the following components:   Potassium 5.2 (*)    CO2 20 (*)    Glucose, Bld 103 (*)    BUN 25 (*)    Creatinine, Ser 4.10 (*)    Albumin 2.7 (*)    AST 458 (*)    ALT 180 (*)    Alkaline Phosphatase 138 (*)    Total Bilirubin 2.5 (*)    GFR calc non Af Amer 14 (*)    GFR calc Af Amer 16 (*)    Anion gap 18 (*)    All other components within normal limits  I-STAT CHEM 8, ED - Abnormal; Notable for the following components:   Potassium 5.2 (*)    BUN 29 (*)    Creatinine, Ser 4.00 (*)    Calcium, Ion 1.07 (*)    Hemoglobin 12.9 (*)    HCT 38.0  (*)    All other components within normal limits  I-STAT CG4 LACTIC ACID, ED -  Abnormal; Notable for the following components:   Lactic Acid, Venous 7.85 (*)    All other components within normal limits  I-STAT TROPONIN, ED - Abnormal; Notable for the following components:   Troponin i, poc 0.19 (*)    All other components within normal limits  I-STAT ARTERIAL BLOOD GAS, ED - Abnormal; Notable for the following components:   pO2, Arterial 199.0 (*)    TCO2 21 (*)    Acid-base deficit 5.0 (*)    All other components within normal limits  CULTURE, BLOOD (ROUTINE X 2)  CULTURE, BLOOD (ROUTINE X 2)  URINE CULTURE  URINALYSIS, ROUTINE W REFLEX MICROSCOPIC  CBG MONITORING, ED  CBG MONITORING, ED  CBG MONITORING, ED  I-STAT CG4 LACTIC ACID, ED    EKG EKG Interpretation  Date/Time:  Monday December 18 2017 09:57:42 EDT Ventricular Rate:  63 PR Interval:    QRS Duration: 250 QT Interval:  594 QTC Calculation: 609 R Axis:   -109 Text Interpretation:  RBBB and LAFB Since last EKG, no significant change Confirmed by Duffy Bruce [phone]) on 12/18/2017 10:08:31 AM   Radiology Ct Head Wo Contrast  Result Date: 12/18/2017 CLINICAL DATA:  67 year old male with altered level of consciousness. Initial encounter. EXAM: CT HEAD WITHOUT CONTRAST TECHNIQUE: Contiguous axial images were obtained from the base of the skull through the vertex without intravenous contrast. COMPARISON:  12/07/2017. FINDINGS: Brain: Motion degraded exam. No intracranial hemorrhage or CT evidence of large acute infarct. Moderate chronic microvascular changes. Atrophy. No intracranial mass lesion noted on this unenhanced exam. Vascular: Vascular calcifications Skull: No skull fracture detected. Sinuses/Orbits: No acute orbital abnormality. Mucosal thickening maxillary sinuses with polypoid configuration on the right. Mild mucosal thickening ethmoid sinus air cells. Other: Mastoid air cells and middle ear cavities are clear.  IMPRESSION: Motion degraded examination. No intracranial hemorrhage or CT evidence of large acute infarct. Moderate chronic microvascular changes.  Atrophy. Maxillary sinus and ethmoid sinus mucosal thickening. Electronically Signed   By: Genia Del M.D.   On: 12/18/2017 11:16   Dg Chest Port 1 View  Result Date: 12/18/2017 CLINICAL DATA:  Code sepsis.  Altered mental status. EXAM: PORTABLE CHEST 1 VIEW COMPARISON:  12/16/2017 and prior radiographs FINDINGS: New peripheral LEFT basilar opacity now noted and may represent consolidation, atelectasis and/or effusion. Cardiomegaly and pulmonary vascular congestion again identified. RIGHT basilar atelectasis/airspace disease is unchanged. There is no evidence of pneumothorax. A RIGHT IJ central venous catheter with tip overlying the LOWER cavoatrial junction noted. IMPRESSION: New peripheral LEFT basilar opacity which may represent consolidation, atelectasis and/or effusion. Cardiomegaly and pulmonary vascular congestion. Electronically Signed   By: Margarette Canada M.D.   On: 12/18/2017 10:36    Procedures .Critical Care Performed by: Duffy Bruce, MD Authorized by: Duffy Bruce, MD   Critical care provider statement:    Critical care time (minutes):  35   Critical care time was exclusive of:  Separately billable procedures and treating other patients and teaching time   Critical care was necessary to treat or prevent imminent or life-threatening deterioration of the following conditions:  Cardiac failure, circulatory failure, respiratory failure and sepsis   Critical care was time spent personally by me on the following activities:  Development of treatment plan with patient or surrogate, discussions with consultants, evaluation of patient's response to treatment, examination of patient, obtaining history from patient or surrogate, ordering and performing treatments and interventions, ordering and review of laboratory studies, ordering and review of  radiographic studies, pulse oximetry, re-evaluation  of patient's condition and review of old charts   I assumed direction of critical care for this patient from another provider in my specialty: no   Procedure Name: Intubation Date/Time: 12/18/2017 11:22 AM Performed by: Duffy Bruce, MD Pre-anesthesia Checklist: Patient identified, Patient being monitored, Emergency Drugs available, Timeout performed and Suction available Oxygen Delivery Method: Non-rebreather mask Preoxygenation: Pre-oxygenation with 100% oxygen Induction Type: Rapid sequence Ventilation: Mask ventilation without difficulty Laryngoscope Size: Glidescope and 3 Grade View: Grade I Tube size: 7.5 mm Number of attempts: 1 Airway Equipment and Method: Rigid stylet and Video-laryngoscopy Placement Confirmation: ETT inserted through vocal cords under direct vision,  CO2 detector and Breath sounds checked- equal and bilateral Secured at: 23 cm Tube secured with: ETT holder Dental Injury: Teeth and Oropharynx as per pre-operative assessment  Difficulty Due To: Difficulty was unanticipated Future Recommendations: Recommend- induction with short-acting agent, and alternative techniques readily available      (including critical care time)  Medications Ordered in ED Medications  naloxone (NARCAN) 0.4 MG/ML injection (has no administration in time range)  vancomycin (VANCOCIN) 1,750 mg in sodium chloride 0.9 % 500 mL IVPB (1,750 mg Intravenous New Bag/Given 12/18/17 1029)  piperacillin-tazobactam (ZOSYN) IVPB 3.375 g (has no administration in time range)  vancomycin (VANCOCIN) IVPB 1000 mg/200 mL premix (has no administration in time range)  fentaNYL 2512mcg in NS 222mL (42mcg/ml) infusion-PREMIX (25 mcg/hr Intravenous New Bag/Given 12/18/17 1124)  fentaNYL (SUBLIMAZE) bolus via infusion 25 mcg (has no administration in time range)  midazolam (VERSED) injection 1 mg (has no administration in time range)  midazolam (VERSED)  injection 1 mg (has no administration in time range)  naloxone [facility]) injection 0.4 mg (0.4 mg Intravenous Given 12/18/17 1010)  sodium chloride 0.9 % bolus 500 mL (500 mLs Intravenous New Bag/Given 12/18/17 1030)  piperacillin-tazobactam (ZOSYN) IVPB 3.375 g (3.375 g Intravenous New Bag/Given 12/18/17 1102)  etomidate (AMIDATE) injection (15 mg Intravenous Given 12/18/17 1112)  rocuronium (ZEMURON) injection (80 mg Intravenous Given 12/18/17 1112)     Initial Impression / Assessment and Plan / ED Course  I have reviewed the triage vital signs and the nursing notes.  Pertinent labs & imaging results that were available during my care of the patient were reviewed by me and considered in my medical decision making (see chart for details).    67 year old male status post recent long hospitalization for V. fib arrest here with altered mental status.  Although blood gas is reassuring, the patient is markedly confused with GCS alternating between 6 and 9.  While he does carry a diagnosis of dementia, this appears to be significantly less than his baseline.  Repeat sugars have been normal.  Narcan was trialed without relief.  I confirmed on his previous paperwork that he is full code.  Mental status progressively worsened, and he was subsequently intubated as above.  Patient has new possible pneumonia on chest x-ray with marketed lactic acidosis.  He was hypotensive in the 70s so code sepsis was initiated and he was given 500 cc of fluid.  He does have some pulmonary edema on his x-ray, and blood pressure has responded well to small amounts of fluid.  30/kg likely contraindicated in the setting of his ESRD and known CHF with recent EF of 15-20.  Will continue cautious fluids, admit to the ICU.   Final Clinical Impressions(s) / ED Diagnoses   Final diagnoses:  Disorientation  Sepsis, due to unspecified organism [facility])  Lactic acidosis    ED Discharge Orders  None       Duffy Bruce,  MD 12/18/17 1137

## 2017-12-19 ENCOUNTER — Inpatient Hospital Stay (HOSPITAL_COMMUNITY): Payer: Medicare Other

## 2017-12-19 DIAGNOSIS — Z7189 Other specified counseling: Secondary | ICD-10-CM

## 2017-12-19 DIAGNOSIS — Z515 Encounter for palliative care: Secondary | ICD-10-CM

## 2017-12-19 DIAGNOSIS — E162 Hypoglycemia, unspecified: Secondary | ICD-10-CM

## 2017-12-19 DIAGNOSIS — I469 Cardiac arrest, cause unspecified: Secondary | ICD-10-CM

## 2017-12-19 LAB — GLUCOSE, CAPILLARY
GLUCOSE-CAPILLARY: 92 mg/dL (ref 70–99)
GLUCOSE-CAPILLARY: 74 mg/dL (ref 70–99)
Glucose-Capillary: 86 mg/dL (ref 70–99)
Glucose-Capillary: 88 mg/dL (ref 70–99)
Glucose-Capillary: 91 mg/dL (ref 70–99)
Glucose-Capillary: 94 mg/dL (ref 70–99)

## 2017-12-19 LAB — PROCALCITONIN: Procalcitonin: 1.95 ng/mL

## 2017-12-19 LAB — BASIC METABOLIC PANEL
ANION GAP: 9 (ref 5–15)
BUN: 39 mg/dL — AB (ref 8–23)
CO2: 27 mmol/L (ref 22–32)
CREATININE: 4.27 mg/dL — AB (ref 0.61–1.24)
Calcium: 8.7 mg/dL — ABNORMAL LOW (ref 8.9–10.3)
Chloride: 100 mmol/L (ref 98–111)
GFR calc Af Amer: 15 mL/min — ABNORMAL LOW (ref >60)
GFR, EST NON AFRICAN AMERICAN: 13 mL/min — AB (ref >60)
Glucose, Bld: 106 mg/dL — ABNORMAL HIGH (ref 70–99)
POTASSIUM: 4.2 mmol/L (ref 3.5–5.1)
Sodium: 136 mmol/L (ref 135–145)

## 2017-12-19 LAB — BLOOD GAS, ARTERIAL
Acid-Base Excess: 0.7 mmol/L (ref 0.0–2.0)
Bicarbonate: 24.8 mmol/L (ref 20.0–28.0)
Drawn by: 414221
FIO2: 40
LHR: 16 {breaths}/min
O2 Saturation: 99 %
PCO2 ART: 39.1 mmHg (ref 32.0–48.0)
PEEP/CPAP: 5 cmH2O
PO2 ART: 122 mmHg — AB (ref 83.0–108.0)
Patient temperature: 98
VT: 530 mL
pH, Arterial: 7.416 (ref 7.350–7.450)

## 2017-12-19 LAB — CBC
HCT: 30.3 % — ABNORMAL LOW (ref 39.0–52.0)
HCT: 31.9 % — ABNORMAL LOW (ref 39.0–52.0)
HEMOGLOBIN: 9.1 g/dL — AB (ref 13.0–17.0)
Hemoglobin: 9.8 g/dL — ABNORMAL LOW (ref 13.0–17.0)
MCH: 27 pg (ref 26.0–34.0)
MCH: 27.5 pg (ref 26.0–34.0)
MCHC: 30 g/dL (ref 30.0–36.0)
MCHC: 30.7 g/dL (ref 30.0–36.0)
MCV: 89.9 fL (ref 78.0–100.0)
MCV: 89.4 fL (ref 78.0–100.0)
PLATELETS: 88 10*3/uL — AB (ref 150–400)
Platelets: 74 10*3/uL — ABNORMAL LOW (ref 150–400)
RBC: 3.37 MIL/uL — ABNORMAL LOW (ref 4.22–5.81)
RBC: 3.57 MIL/uL — ABNORMAL LOW (ref 4.22–5.81)
RDW: 21 % — AB (ref 11.5–15.5)
RDW: 21.3 % — AB (ref 11.5–15.5)
WBC: 8.4 10*3/uL (ref 4.0–10.5)
WBC: 9.8 10*3/uL (ref 4.0–10.5)

## 2017-12-19 LAB — PROTIME-INR
INR: 3.89
Prothrombin Time: 37.8 seconds — ABNORMAL HIGH (ref 11.4–15.2)

## 2017-12-19 LAB — URINE CULTURE: Culture: NO GROWTH

## 2017-12-19 LAB — PHOSPHORUS
PHOSPHORUS: 4.2 mg/dL (ref 2.5–4.6)
PHOSPHORUS: 1.8 mg/dL — AB (ref 2.5–4.6)

## 2017-12-19 LAB — MAGNESIUM
MAGNESIUM: 1.8 mg/dL (ref 1.7–2.4)
Magnesium: 1.8 mg/dL (ref 1.7–2.4)

## 2017-12-19 MED ORDER — LIDOCAINE HCL (PF) 1 % IJ SOLN: 5.0000 mL | INTRAMUSCULAR | Status: DC | PRN

## 2017-12-19 MED ORDER — ALTEPLASE 2 MG IJ SOLR: 2.0000 mg | Freq: Once | INTRAMUSCULAR | Status: DC | PRN

## 2017-12-19 MED ORDER — LIDOCAINE-PRILOCAINE 2.5-2.5 % EX CREA
1.0000 "application " | TOPICAL_CREAM | CUTANEOUS | Status: DC | PRN
  Filled 2017-12-19: qty 5

## 2017-12-19 MED ORDER — HEPARIN SODIUM (PORCINE) 1000 UNIT/ML DIALYSIS: 2700.0000 [IU] | INTRAMUSCULAR | Status: DC | PRN

## 2017-12-19 MED ORDER — DEXTROSE 5 % IV SOLN: INTRAVENOUS | Status: DC

## 2017-12-19 MED ORDER — MEXILETINE HCL 150 MG PO CAPS
150.0000 mg | ORAL_CAPSULE | Freq: Two times a day (BID) | ORAL | Status: DC
  Administered 2017-12-19 – 2017-12-21 (×4): 150 mg
  Filled 2017-12-19 (×6): qty 1

## 2017-12-19 MED ORDER — HEPARIN SODIUM (PORCINE) 1000 UNIT/ML DIALYSIS: 1000.0000 [IU] | INTRAMUSCULAR | Status: DC | PRN

## 2017-12-19 MED ORDER — ATORVASTATIN CALCIUM 10 MG PO TABS
10.0000 mg | ORAL_TABLET | Freq: Every day | ORAL | Status: DC
  Administered 2017-12-19: 10 mg
  Filled 2017-12-19: qty 1

## 2017-12-19 MED ORDER — DEXTROSE 5 % IV SOLN
INTRAVENOUS | Status: DC
  Administered 2017-12-19 – 2017-12-21 (×4): via INTRAVENOUS

## 2017-12-19 MED ORDER — VITAL AF 1.2 CAL PO LIQD
1500.0000 mL | ORAL | Status: DC
  Administered 2017-12-19: 1500 mL
  Filled 2017-12-19: qty 1500

## 2017-12-19 MED ORDER — SODIUM CHLORIDE 0.9 % IV SOLN: 100.0000 mL | INTRAVENOUS | Status: DC | PRN

## 2017-12-19 MED ORDER — PENTAFLUOROPROP-TETRAFLUOROETH EX AERO: 1.0000 "application " | INHALATION_SPRAY | CUTANEOUS | Status: DC | PRN

## 2017-12-19 NOTE — Progress Notes (Addendum)
[facility] Progress Note   Subjective:  On the vent, opens eyes to voice  Objective Vitals:   12/19/17 0800 12/19/17 0835 12/19/17 0900 12/19/17 1000  BP: 99/62  (!) 95/59 108/77  Pulse: (!) 55 (!) 55 (!) 54 (!) 57  Resp: '17  16 17  ' Temp:      TempSrc:      SpO2: 100% 100% 100% 100%  Weight:      Height:       Physical Exam General:NAD, chronically ill appearing male, intubated Heart:bradycardia, regular rhythm, 2/6 systolic murmur Lungs:mostly CTAB anteriorly  Abdomen:soft, NTND, colostomy present L side Extremities:1-2+ bilat thigh and lower abd wall edema Dialysis Access: R IJ TDC   CXR 7/23 - ? vasc congestion/ IS edema  Dialysis Orders: [facility] (started May 2019) 4h 400/800 79kg 3K/ 2Ca bath Hep 2700 R IJ TDC - mircera 75 every 2 wks last 6/27  Home meds: - coumadin 47m hs/ lipitor 10 mg hs/ lopressor 12.5 bid/ mexilitene 150 bid  - allopurinol 300 qd/ protonix 40 qd/ sl ntg prn/ mvi - wellbutrin 1086mqd/ baclofen 5 mg tid prn/ percocet prn/ remeron 7.5 hs - ranexa 500 bid  Assessment/Plan: 1. AMS/ VDRF - hypoglycemia/ possible HCAP- on IV abx 2. ESRD - TTS HD. HD today at bedside if BP's will tolerate. Significant FTT and wt loss since starting HD recently in May.  Poor prognosis, will meet w/ family/ pall care to discuss options.  Not CRRT candidate.  3. Anemia of CKD- Hgb 12.9. No indication for ESA at this time. 4. Secondary hyperparathyroidism - Ca and phos at goal.  5. HTN/volume - BP's soft, sig edema bilat LE's, +5kg but BP's soft, have dc'd IVF"s no need, if dextrose needed please use D5W  6. Nutrition - renal diet with fluid restrictions once advanced. 7. Hx A. Fib - per primary 8. Recent VT arrest/ severe CM EF 15-20% recent admit 9. Hx colon cancer 10. Hx depression 11. Hx gout    [facility] pager [phone] 12/19/2017, 10:20 AM  Filed Weights   12/18/17 1400  Weight: 84 kg  (185 lb 3 oz)    Intake/Output Summary (Last 24 hours) at 12/19/2017 1020 Last data filed at 12/19/2017 0900 Gross per 24 hour  Intake 1715.29 ml  Output 0 ml  Net 1715.29 ml    Additional Objective Labs: Basic Metabolic Panel: Recent Labs  Lab 12/17/17 0032 12/18/17 1031 12/18/17 1042 12/18/17 1338 12/18/17 1632 12/19/17 0433  NA 132* 136 136  --   --  136  K 3.9 5.2* 5.2*  --   --  4.2  CL 94* 98 98  --   --  100  CO2 24 20*  --   --   --  27  GLUCOSE 102* 103* 96  --   --  106*  BUN 32* 25* 29*  --   --  39*  CREATININE 5.05* 4.10* 4.00*  --   --  4.27*  CALCIUM 8.9 9.1  --   --   --  8.7*  PHOS  --   --   --  4.6 4.6 4.2   Liver Function Tests: Recent Labs  Lab 12/18/17 1031  AST 458*  ALT 180*  ALKPHOS 138*  BILITOT 2.5*  PROT 7.0  ALBUMIN 2.7*   No results for input(s): LIPASE, AMYLASE in the last 168 hours. CBC: Recent Labs  Lab 12/15/17 0513 12/16/17 0442 12/17/17 0032  12/18/17 1031 12/18/17 1042 12/19/17 0433  WBC 4.3 6.7 8.1 7.8  --  8.4  NEUTROABS  --  5.0  --  6.5  --   --   HGB 9.0* 9.2* 9.4* 10.5* 12.9* 9.1*  HCT 30.0* 30.5* 30.6* 36.5* 38.0* 30.3*  MCV 89.6 88.4 87.2 93.6  --  89.9  PLT PLATELET CLUMPS NOTED ON SMEAR, COUNT APPEARS DECREASED 122* 144* 87*  --  74*   Blood Culture    Component Value Date/Time   SDES BLOOD LEFT HAND 12/07/2017 0418   SPECREQUEST  12/07/2017 0418    BOTTLES DRAWN AEROBIC AND ANAEROBIC Blood Culture adequate volume   CULT  12/07/2017 0418    NO GROWTH 5 DAYS Performed at [facility], [place] [address] [place],  [address]    REPTSTATUS 12/12/2017 FINAL 12/07/2017 0418    Cardiac Enzymes: Recent Labs  Lab 12/18/17 1338 12/18/17 2055  TROPONINI 0.18* 0.18*   CBG: Recent Labs  Lab 12/18/17 1622 12/18/17 1926 12/18/17 2314 12/19/17 0345 12/19/17 0752  GLUCAP 86 87 98 94 92   Iron Studies: No results for input(s): IRON, TIBC, TRANSFERRIN, FERRITIN in the last 72 hours. Lab  Results  Component Value Date   INR 3.89 12/19/2017   INR 3.76 12/18/2017   INR 2.16 12/17/2017    Medications: . ceFEPime (MAXIPIME) IV    . dextrose 5 % and 0.45% NaCl 50 mL/hr at 12/19/17 0900  . famotidine (PEPCID) IV 20 mg (12/19/17 0930)  . vancomycin     . atorvastatin  10 mg Per Tube q1800  . chlorhexidine gluconate (MEDLINE KIT)  15 mL Mouth Rinse BID  . Chlorhexidine Gluconate Cloth  6 each Topical Daily  . Chlorhexidine Gluconate Cloth  6 each Topical Q0600  . feeding supplement (PRO-STAT SUGAR FREE 64)  30 mL Per Tube BID  . feeding supplement (VITAL HIGH PROTEIN)  1,000 mL Per Tube Q24H  . fentaNYL (SUBLIMAZE) injection  50 mcg Intravenous Once  . mouth rinse  15 mL Mouth Rinse 10 times per day  . mexiletine  150 mg Per Tube BID  . ranolazine  500 mg Oral BID  . Warfarin - Pharmacist Dosing Inpatient   Does not apply [phone]

## 2017-12-19 NOTE — Consult Note (Signed)
                                                                                 Consultation Note Date: 12/19/2017   Patient Name: Patrick [name]  DOB: 06/02/1950  MRN: [id number]  Age / Sex: 67 y.o., male  PCP: Patient, [name] Referring Physician: Rosenblatt, Robert J, MD  Reason for Consultation: Establishing goals of care  HPI/Patient Profile: 67 y.o. male  with past medical history of CHF (EF 15-20%), CAD, HTN, DM, OA, ESRD on HD (started HD on 10/09/17), colon cancer s/p colostomy (2018), and Afib (on coumadin), recent admission with VT arrest admitted on 12/18/2017 with AMS, glucose 19, hypotension. Intubated at admission for airway protection. Treating for possible HCAP. Overall prognosis very poor.   Clinical Assessment and Goals of Care: I met today at Barrett's bedside. He is on vent but did arouse and was attempting to communicate with me. He quickly became frustrated by not being able to speak so I explained to him that he is in the hospital and reassured him that we will take good care of him.   I called and spoke with Asaf's sister/NOK, [name]. [name] shares that they are all each other have. Neither ever married or had any children, no other siblings, and their parents are deceased. [name] shares that she lives at [facility] as well and is bed-bound herself so she is unable to come to the hospital. She is very tearful throughout our conversation as we review his decline with renal failure (began in May) and CHF EF 15-20%. [name] is extremely tearful and struggling with her brother's poor prognosis.   I spoke with [name] about Patrick [name]'s wishes and she shares with me that he has told her that he wants everything tried to keep him alive (she admits that she does not want this for herself but his goals have been different than hers). I shared with her that we may be reaching a point where everything has been tried. Encouraged her to consider if Kaliel would want CPR/resuscitation if we know that this  will not help his chronic disease causing his decline and would cause suffering. [name] understands. I am hopeful that Patrick [name] will be able to be extubated so that we can have this conversation with him. [name] is very emotional and unhealthy herself and will struggle to make these decisions alone. I will continue to follow and discuss.   Patrick [name] has previously graduated from "bible college" per [name] and has worked many jobs as minister of music. 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 He has been living at [facility] since January 2019.   Primary Decision Maker NEXT OF KIN (unless patient arouses and able to communicate to participate in discussion)    SUMMARY OF RECOMMENDATIONS   - Will continue GOC conversion with sister and hopefully patient  Code Status/Advance Care Planning:  Full code - will need further discussion   Symptom Management:   Per PCCM, renal.   Palliative Prophylaxis:   Aspiration, Bowel Regimen, Delirium Protocol, Frequent Pain Assessment, Oral Care and Turn Reposition  Psycho-social/Spiritual:   Desire for further Chaplaincy support:yes    Additional Recommendations: Caregiving  Support/Resources, Education on Hospice and Grief/Bereavement Support  Prognosis:   Prognosis very poor with severe/chronic organ dysfunction that will not improve. Unclear how long he will be able to continue dialysis.   Discharge Planning: To Be Determined      Primary Diagnoses: Present on Admission: . Altered mental state   I have reviewed the medical record, interviewed the patient and family, and examined the patient. The following aspects are pertinent.  Past Medical History:  Diagnosis Date  . Arthritis    "right hand; right knee" (06/19/2014)  . CAD (coronary artery disease)    2v CAD with subtotal occlusion of small co-dominant RCA and borderline lesion in moderate-sized OM-1  . Chronic systolic (congestive) heart failure ([place])    Preserved EF  2016, has been between 25% and 45% since then  . CKD (chronic kidney disease), stage III ([place])   . Coronary artery disease involving native coronary artery of native heart with angina pectoris ([place]) 09/11/2016  . Degenerative joint disease of knee, right   . Dyspnea   . Dysrhythmia   . Enlarged prostate   . Hyperlipidemia   . Hypertension   . Pneumonia 05/2014  . Prediabetes 10/03/2014  . Scrotal edema 04/03/2015  . SMALL BOWEL OBSTRUCTION, HX OF 08/21/2007   Annotation: with narrowing in the ileocecal region Qualifier: Diagnosis of  By: Hassell Done FNP, Tori Milks     Social History   Socioeconomic History  . Marital status: Single    Spouse name: Not on file  . Number of children: Not on file  . Years of education: Not on file  . Highest education level: Not on file  Occupational History  . Occupation: Retired  Scientific laboratory technician  . Financial resource strain: Not on file  . Food insecurity:    Worry: Not on file    Inability: Not on file  . Transportation needs:    Medical: Not on file    Non-medical: Not on file  Tobacco Use  . Smoking status: Former Smoker    Years: 5.00    Types: Pipe, Landscape architect  . Smokeless tobacco: Never Used  . Tobacco comment: 06/19/2014 "stopped smoking in ~ 2014; used to smoke a pipe or cigar a couple times/month"  Substance and Sexual Activity  . Alcohol use: No  . Drug use: No  . Sexual activity: Not Currently  Lifestyle  . Physical activity:    Days per week: Not on file    Minutes per session: Not on file  . Stress: Not on file  Relationships  . Social connections:    Talks on phone: Not on file    Gets together: Not on file    Attends religious service: Not on file    Active member of club or organization: Not on file    Attends meetings of clubs or organizations: Not on file    Relationship status: Not on file  Other Topics Concern  . Not on file  Social History Narrative   Single, lives in a facility. His sister lives locally, but is homebound.      Family History  Problem Relation Age of Onset  . Hypertension Mother   . Diabetes Mother   . Stroke Mother   . Cancer Father 47       Prostate  . Dementia Father   . COPD Sister   . Arthritis Sister   . Edema Sister    Scheduled Meds: . atorvastatin  10 mg Per Tube q1800  .  chlorhexidine gluconate (MEDLINE KIT)  15 mL Mouth Rinse BID  . Chlorhexidine Gluconate Cloth  6 each Topical Daily  . Chlorhexidine Gluconate Cloth  6 each Topical Q0600  . feeding supplement (PRO-STAT SUGAR FREE 64)  30 mL Per Tube BID  . fentaNYL (SUBLIMAZE) injection  50 mcg Intravenous Once  . mouth rinse  15 mL Mouth Rinse 10 times per day  . mexiletine  150 mg Per Tube BID  . ranolazine  500 mg Oral BID  . Warfarin - Pharmacist Dosing Inpatient   Does not apply q1800   Continuous Infusions: . sodium chloride    . sodium chloride    . ceFEPime (MAXIPIME) IV    . dextrose 40 mL/hr at 12/19/17 1300  . famotidine (PEPCID) IV Stopped (12/19/17 1000)  . feeding supplement (VITAL AF 1.2 CAL)    . vancomycin Stopped (12/19/17 1249)   PRN Meds:.sodium chloride, sodium chloride, alteplase, bisacodyl, docusate, fentaNYL (SUBLIMAZE) injection, heparin, heparin, lidocaine (PF), lidocaine-prilocaine, midazolam, pentafluoroprop-tetrafluoroeth Allergies  Allergen Reactions  . Nsaids     Kidney disease   Review of Systems  Unable to perform ROS: Intubated    Physical Exam  Constitutional: He appears well-developed. He is intubated.  HENT:  Head: Normocephalic and atraumatic.  Cardiovascular: Normal rate and regular rhythm.  Pulmonary/Chest: Effort normal. No accessory muscle usage. No tachypnea. He is intubated. No respiratory distress.  Abdominal: Soft. Normal appearance.  Colostomy  Neurological: He is alert.  Unable to assess orientation at this time  Nursing note and vitals reviewed.   Vital Signs: BP 115/82   Pulse 65   Temp 97.7 F (36.5 C) (Oral)   Resp (!) 22   Ht 5' 7" (1.702 m)    Wt 84 kg (185 lb 3 oz)   SpO2 98%   BMI 29.00 kg/m  Pain Scale: CPOT   Pain Score: Asleep   SpO2: SpO2: 98 % O2 Device:SpO2: 98 % O2 Flow Rate: .   IO: Intake/output summary:   Intake/Output Summary (Last 24 hours) at 12/19/2017 1420 Last data filed at 12/19/2017 1300 Gross per 24 hour  Intake 2087.48 ml  Output 0 ml  Net 2087.48 ml    LBM:   Baseline Weight: Weight: 84 kg (185 lb 3 oz) Most recent weight: Weight: 84 kg (185 lb 3 oz)     Palliative Assessment/Data: 40%     Time Total: 70 min  Greater than 50%  of this time was spent counseling and coordinating care related to the above assessment and plan.  Signed by: Vinie Sill, NP Palliative Medicine Team Pager # [phone] (M-F 8a-5p) Team Phone # [phone] (Nights/Weekends)

## 2017-12-19 NOTE — Progress Notes (Signed)
Peri care and foley care performed and urinary catheter was removed at 2200. Yellowish-orange drainage present at tip of catheter upon removal but there was no odor, catheter was intact. Catheter removed per order.

## 2017-12-19 NOTE — Progress Notes (Addendum)
Patrick [name] for warfarin Indication: atrial fibrillation  Allergies  Allergen Reactions  . Nsaids     Kidney disease    Patient Measurements: Height: 5\' 7"  (170.2 cm) Weight: 185 lb 3 oz (84 kg) IBW/kg (Calculated) : 66.1  Vital Signs: Temp: 97.7 F (36.5 C) (07/23 1126) Temp Source: Oral (07/23 1126) BP: 111/61 (07/23 1300) Pulse Rate: 60 (07/23 1300)  Labs: Recent Labs    12/17/17 0000  12/17/17 0032 12/17/17 0647 12/18/17 1031 12/18/17 1042 12/18/17 1338 12/18/17 2055 12/19/17 0433  HGB  --    < > 9.4*  --  10.5* 12.9*  --   --  9.1*  HCT  --    < > 30.6*  --  36.5* 38.0*  --   --  30.3*  PLT  --   --  144*  --  87*  --   --   --  74*  LABPROT  --   --  24.0*  --   --   --  36.9*  --  37.8*  INR  --   --  2.16  --   --   --  3.76  --  3.89  HEPARINUNFRC 0.37  --   --  0.50  --   --   --   --   --   CREATININE  --    < > 5.05*  --  4.10* 4.00*  --   --  4.27*  TROPONINI  --   --   --   --   --   --  0.18* 0.18*  --    < > = values in this interval not displayed.    Estimated Creatinine Clearance: 16.9 mL/min (A) (by C-G formula based on SCr of 4.27 mg/dL (H)).   Medical History: Past Medical History:  Diagnosis Date  . Arthritis    "right hand; right knee" (06/19/2014)  . CAD (coronary artery disease)    2v CAD with subtotal occlusion of small co-dominant RCA and borderline lesion in moderate-sized OM-1  . Chronic systolic (congestive) heart failure ([place])    Preserved EF 2016, has been between 25% and 45% since then  . CKD (chronic kidney disease), stage III ([place])   . Coronary artery disease involving native coronary artery of native heart with angina pectoris ([place]) 09/11/2016  . Degenerative joint disease of knee, right   . Dyspnea   . Dysrhythmia   . Enlarged prostate   . Hyperlipidemia   . Hypertension   . Pneumonia 05/2014  . Prediabetes 10/03/2014  . Scrotal edema 04/03/2015  . SMALL BOWEL OBSTRUCTION, HX OF  08/21/2007   Annotation: with narrowing in the ileocecal region Qualifier: Diagnosis of  By: Hassell Done FNP, Tori Milks      Assessment: 73 yoM on warfarin PTA for hx of atrial fibrillation admitted with AMS and respiratory distress. Pharmacy consulted to resume warfarin. INR on admit elevated at 3.76, last dose of warfarin was taken 7/21 at 1800. INR today is 3.89. CT head negative for acute process, CBC appears at baseline.  Goal of Therapy:  INR 2-3 Monitor platelets by anticoagulation protocol: Yes   Plan:  -Hold warfarin tonight -Check INR tomorrow morning -May need to consider heparin drip depending on clinical status  Vertis Kelch, PharmD PGY1 Pharmacy Resident Phone [phone] 12/19/2017       2:07 PM

## 2017-12-19 NOTE — Progress Notes (Signed)
PULMONARY / CRITICAL CARE MEDICINE   Name: Patrick [name] MRN: [id number] DOB: Apr 06, 1951    ADMISSION DATE:  12/18/2017 CONSULTATION DATE:  12/18/17  REFERRING MD:  Ellender Hose  CHIEF COMPLAINT:  AMS  HISTORY OF PRESENT ILLNESS:  Pt is encephelopathic; therefore, this HPI is obtained from chart review. Patrick [name] is a 67 y.o. male with PMH as outlined below and who resides in a nursing home. Had recent admission 12/07/17 through 12/17/17 for V.fib arrest (had cardiac cath that showed obstructive CAD with 70% occlusion first diagonal, 70% first O, 90% left PDA, 100% non-dominant RCA, normal LVEDP).  Discharged back to nursing home with repeat labs in a few weeks.  Presented to [facility] ED 7/22 after staff at facility were unable to arouse him.  CBG was noted to be 19.  He was given 1g glucagon and 25mg  D10 with some improvement.  In ED, he had GCS fluctuating from 6 - 9; therefore, he was intubated. CXR concerning for HCAP and SBP low in 70s and 80s but responded to IVF's.    SUBJECTIVE:   No acute issues.  Following commands this AM.  Attempted SBT but had low minute ventilation. EEG with slowing without epileptiform activity; however, multiple episodes of tongue twitching.  VITAL SIGNS: BP 99/62   Pulse (!) 55   Temp 97.7 F (36.5 C) (Oral)   Resp 17   Ht 5\' 7"  (1.702 m)   Wt 84 kg (185 lb 3 oz)   SpO2 100%   BMI 29.00 kg/m   HEMODYNAMICS:    VENTILATOR SETTINGS: Vent Mode: PRVC FiO2 (%):  [40 %] 40 % Set Rate:  [16 bmp] 16 bmp Vt Set:  [530 mL] 530 mL PEEP:  [5 cmH20] 5 cmH20 Plateau Pressure:  [16 cmH20-20 cmH20] 16 cmH20  INTAKE / OUTPUT: I/O last 3 completed shifts: In: 1470.3 [I.V.:650.8; NG/GT:644; IV Piggyback:175.5] Out: 0    PHYSICAL EXAMINATION: General: Elderly male, chronically ill appearing, resting in bed, in NAD. Neuro: Somnolent but follows basic commands. HEENT: [place]/AT. Sclerae anicteric. Cardiovascular: RRR, no M/R/G.  Lungs: Respirations even and unlabored.   CTA bilaterally, No W/R/R. Abdomen: Ostomy bag in place.  BS x 4, soft, NT/ND.  Musculoskeletal: No gross deformities, no edema.  Skin: Warm, dry, no rashes.   LABS:  BMET Recent Labs  Lab 12/17/17 0032 12/18/17 1031 12/18/17 1042 12/19/17 0433  NA 132* 136 136 136  K 3.9 5.2* 5.2* 4.2  CL 94* 98 98 100  CO2 24 20*  --  27  BUN 32* 25* 29* 39*  CREATININE 5.05* 4.10* 4.00* 4.27*  GLUCOSE 102* 103* 96 106*    Electrolytes Recent Labs  Lab 12/17/17 0032 12/18/17 1031 12/18/17 1338 12/18/17 1632 12/19/17 0433  CALCIUM 8.9 9.1  --   --  8.7*  MG 1.7  --  1.9 2.0 1.8  PHOS  --   --  4.6 4.6 4.2    CBC Recent Labs  Lab 12/17/17 0032 12/18/17 1031 12/18/17 1042 12/19/17 0433  WBC 8.1 7.8  --  8.4  HGB 9.4* 10.5* 12.9* 9.1*  HCT 30.6* 36.5* 38.0* 30.3*  PLT 144* 87*  --  74*    Coag's Recent Labs  Lab 12/17/17 0032 12/18/17 1338 12/19/17 0433  INR 2.16 3.76 3.89    Sepsis Markers Recent Labs  Lab 12/18/17 1042 12/18/17 1338 12/18/17 1632 12/19/17 0433  LATICACIDVEN 7.85* 5.3* 4.0*  --   PROCALCITON  --  0.99  --  1.95  ABG Recent Labs  Lab 12/18/17 1043 12/18/17 1235 12/19/17 0555  PHART 7.350 7.347* 7.416  PCO2ART 36.2 38.5 39.1  PO2ART 199.0* 113.0* 122*    Liver Enzymes Recent Labs  Lab 12/18/17 1031  AST 458*  ALT 180*  ALKPHOS 138*  BILITOT 2.5*  ALBUMIN 2.7*    Cardiac Enzymes Recent Labs  Lab 12/18/17 1338 12/18/17 2055  TROPONINI 0.18* 0.18*    Glucose Recent Labs  Lab 12/18/17 1244 12/18/17 1622 12/18/17 1926 12/18/17 2314 12/19/17 0345 12/19/17 0752  GLUCAP 107* 86 87 98 94 92    Imaging Ct Head Wo Contrast  Result Date: 12/18/2017 CLINICAL DATA:  67 year old male with altered level of consciousness. Initial encounter. EXAM: CT HEAD WITHOUT CONTRAST TECHNIQUE: Contiguous axial images were obtained from the base of the skull through the vertex without intravenous contrast. COMPARISON:  12/07/2017.  FINDINGS: Brain: Motion degraded exam. No intracranial hemorrhage or CT evidence of large acute infarct. Moderate chronic microvascular changes. Atrophy. No intracranial mass lesion noted on this unenhanced exam. Vascular: Vascular calcifications Skull: No skull fracture detected. Sinuses/Orbits: No acute orbital abnormality. Mucosal thickening maxillary sinuses with polypoid configuration on the right. Mild mucosal thickening ethmoid sinus air cells. Other: Mastoid air cells and middle ear cavities are clear. IMPRESSION: Motion degraded examination. No intracranial hemorrhage or CT evidence of large acute infarct. Moderate chronic microvascular changes.  Atrophy. Maxillary sinus and ethmoid sinus mucosal thickening. Electronically Signed   By: Genia Del M.D.   On: 12/18/2017 11:16   Dg Chest Port 1 View  Result Date: 12/19/2017 CLINICAL DATA:  Intubation.  Respiratory failure. EXAM: PORTABLE CHEST 1 VIEW COMPARISON:  12/18/2017. FINDINGS: Endotracheal tube tip 6 mm above the lower portion of the carina. Proximal repositioning of approximately 2 cm should be considered. NG tube noted with tip below left hemidiaphragm. Right IJ dual-lumen catheter with tip over right atrium again noted. Stable cardiomegaly. Persistent left base atelectasis/infiltrate and small left pleural effusion. Slight improvement aeration in the left lung base from prior exam. Mild right base atelectasis noted on today's exam. IMPRESSION: 1. Endotracheal tube tip 6 mm above the lower portion of the carina. Proximal repositioning of approximately 2 cm should be considered. Remaining lines and tubes in stable position. 2. Persistent left base atelectasis/infiltrate small left pleural effusion. Slight improvement in aeration left lung base from prior exam. Mild right base atelectasis noted on today's exam. Critical Value/emergent results were called by telephone at the time of interpretation on 12/19/2017 at 7:01 am to nurse June, who  verbally acknowledged these results. Electronically Signed   By: Marcello Moores  Register   On: 12/19/2017 07:03   Dg Chest Portable 1 View  Result Date: 12/18/2017 CLINICAL DATA:  Line placement EXAM: PORTABLE CHEST 1 VIEW COMPARISON:  Chest x-ray from earlier same day. FINDINGS: Endotracheal tube is well positioned with tip approximately 2 cm above the carina. Enteric tube passes below the diaphragm. RIGHT IJ central catheter with tip projected over the RIGHT atrium, similar orientation to the previous exam. Stable cardiomegaly. LEFT basilar opacity is unchanged in the short-term interval, consistent with pneumonia, atelectasis and/or pleural effusion. No pneumothorax seen. IMPRESSION: 1. Endotracheal tube is now in place with tip well positioned just above the level of the carina. 2. Enteric tube passes below the diaphragm. 3. Stable opacity at the LEFT lung base, unchanged in the short-term interval, consistent with pneumonia, atelectasis and/or pleural effusion. 4. No pneumothorax. Electronically Signed   By: Franki Cabot M.D.   On: 12/18/2017  11:48   Dg Chest Port 1 View  Result Date: 12/18/2017 CLINICAL DATA:  Code sepsis.  Altered mental status. EXAM: PORTABLE CHEST 1 VIEW COMPARISON:  12/16/2017 and prior radiographs FINDINGS: New peripheral LEFT basilar opacity now noted and may represent consolidation, atelectasis and/or effusion. Cardiomegaly and pulmonary vascular congestion again identified. RIGHT basilar atelectasis/airspace disease is unchanged. There is no evidence of pneumothorax. A RIGHT IJ central venous catheter with tip overlying the LOWER cavoatrial junction noted. IMPRESSION: New peripheral LEFT basilar opacity which may represent consolidation, atelectasis and/or effusion. Cardiomegaly and pulmonary vascular congestion. Electronically Signed   By: Margarette Canada M.D.   On: 12/18/2017 10:36     STUDIES:  CT head 7/22 > no hemorrhage or acute infarct.  Moderate chronic microvascular  changes. EEG 7/22 > EEG with slowing without epileptiform activity; however, multiple episodes of tongue twitching.  CULTURES: Blood 7/22 >  Urine 7/22 >  Sputum 7/22 >   ANTIBIOTICS: Vanc 7/22 >  Cefepime 7/22 >   SIGNIFICANT EVENTS: 7/11 through 7/21 > admit. 7/22 > re-admitted.  LINES/TUBES: ETT 7/22 >   DISCUSSION: 67 y.o. male with recent admit for V.Fib arrest, discharged 7/21 to nursing home.  Re-admitted 7/22 with AMS due to hypoglycemia. Required intubation in ED for airway protection given poor GCS.  ASSESSMENT / PLAN:  PULMONARY A: Respiratory insufficiency - with inability to protect the airway; s/p intubation. Possible HCAP. P:   Continue full vent support - failed SBT this AM due to low MV. Wean as able. VAP prevention measures. SBT in AM if able. Empiric abx per ID section. CXR in AM.  CARDIOVASCULAR A:  Hx VF arrest HTN, HLD, dysrhythmia, obstructive CAD (cath from July with 70% occlusion first diagonal, 70% first O, 90% left PDA, 100% non-dominant RCA, normal LVEDP). Troponin bump - suspect demand. P:  Continue warfarin per pharmacy. Continue preadmission ranolazine, atorvastatin. Hold preadmission mexitil, lopressor.  RENAL A:   ESRD - on HD TTS. P:   Nephrology following, planning for routine HD. BMP in AM.  GASTROINTESTINAL A:   GI prophylaxis. Nutrition. P:   SUP: famotidine. NPO. Continue TF's.  HEMATOLOGIC A:   VTE Prophylaxis. P:  SCD's / warfarin. CBC in AM.  INFECTIOUS A:   Possible HCAP. P:   Abx as above (vanc / cefepime).  Follow cultures as above. PCT algorithm to limit abx exposure.  ENDOCRINE A:   Hypoglycemia - s/p 1g glucagon, 25mg  D10.   P:   Continue D51/2 NS. CBG q4hrs.  NEUROLOGIC A:   Sedation needs due to mechanical ventilation. Hx DJD, arthritis. P:   Sedation:  Fentanyl gtt / Midazolam PRN. RASS goal: 0 to -1. Daily WUA. Hold preadmission baclofen, bupropion, mirtazapine,  percocet.  Family updated: None available.  Attempted to call sister Sanon for update (she requested someone call her).  No answer.  Interdisciplinary Family Meeting v Palliative Care Meeting:  Due by: 12/25/17.  CC time: 30 min.   Montey Hora, [facility] Pager: [phone]  or [phone] 12/19/2017, 9:20 AM

## 2017-12-19 NOTE — Progress Notes (Signed)
Initial Nutrition Assessment  DOCUMENTATION CODES:   Severe malnutrition in context of acute illness/injury(likely component of underlying chronic malnutrition as well)  INTERVENTION:   Tube Feeding:  Vital AF 1.2 @ 55 Pro-Stat 30 mL BID Provides 129 g of protein, 1784 kcals, 1069 mL of free water Meets 100% protein needs, 105% calorie needs  NUTRITION DIAGNOSIS:   Severe Malnutrition related to acute illness(recent initiation of HD with hx of CKD, recent VT arrest with prolonged hospital stay, FTT ) as evidenced by moderate fat depletion, moderate muscle depletion.  GOAL:   Patient will meet greater than or equal to 90% of their needs  MONITOR:   PO intake, Labs, Weight trends, Skin, Vent status, TF tolerance  REASON FOR ASSESSMENT:   Consult, Ventilator Enteral/tube feeding initiation and management  ASSESSMENT:    67 yo male admitted with AMS due to hypoglycemia, respiratory insufficiency with inability to protect airway requiring intubation. Pt with ESRD on HD, recently started on HD in May of this year. 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 RN also indicating that pt had been eating minimally prior to admission  Patient is currently intubated on ventilator support MV: 10.7 mL/min Temp (24hrs), Avg:97.8 F (36.6 C), Min:97.7 F (36.5 C), Max:98.1 F (36.7 C)  EDW 79 kg, current wt 84 kg   Pt meets severe malnutrition in the acute illness (<3 month time frame) given initiation of HD in May followed by hospitalization for VT arrest and now admitted for hypoglycemia and respiratory insufficiency requiring intubation. Pt likely has underlying chronic malnutrition as well.   Labs: phosphorus wdl, potassium wdl Meds: D5 at 40 ml/hr  NUTRITION - FOCUSED PHYSICAL EXAM:    Most Recent Value  Orbital Region  Moderate depletion  Upper Arm Region   Moderate depletion  Thoracic and Lumbar Region  Mild depletion  Buccal Region  Unable to assess  Temple Region  Moderate depletion  Clavicle Bone Region  Moderate depletion  Clavicle and Acromion Bone Region  Moderate depletion  Scapular Bone Region  Moderate depletion  Dorsal Hand  Unable to assess  Patellar Region  Moderate depletion  Anterior Thigh Region  Moderate depletion  Posterior Calf Region  Severe depletion  Edema (RD Assessment)  Mild       Diet Order:   Diet Order           Diet NPO time specified  Diet effective now          EDUCATION NEEDS:   Not appropriate for education at this time  Skin:  Skin Assessment: Skin Integrity Issues: Skin Integrity Issues:: Stage II Stage II: coccyx  Last BM:  7/23 colostomy  Height:   Ht Readings from Last 1 Encounters:  12/18/17 5\' 7"  (1.702 m)    Weight:   Wt Readings from Last 1 Encounters:  12/18/17 185 lb 3 oz (84 kg)    Ideal Body Weight:     BMI:  Body mass index is 29 kg/m.  Estimated Nutritional Needs:   Kcal:  1706 kcals   Protein:  118-158   Fluid:  1000 mL plus UOP   [employer]ner MS, RD, LDN, CNSC [phone] Pager  [phone] Weekend/On-Call Pager

## 2017-12-20 ENCOUNTER — Encounter (HOSPITAL_COMMUNITY): Payer: Self-pay

## 2017-12-20 ENCOUNTER — Other Ambulatory Visit (HOSPITAL_COMMUNITY): Payer: Self-pay

## 2017-12-20 ENCOUNTER — Inpatient Hospital Stay (HOSPITAL_COMMUNITY): Payer: Medicare Other

## 2017-12-20 ENCOUNTER — Encounter: Payer: Medicare Other | Admitting: Vascular Surgery

## 2017-12-20 DIAGNOSIS — N186 End stage renal disease: Secondary | ICD-10-CM

## 2017-12-20 DIAGNOSIS — Z992 Dependence on renal dialysis: Secondary | ICD-10-CM

## 2017-12-20 DIAGNOSIS — I5022 Chronic systolic (congestive) heart failure: Secondary | ICD-10-CM

## 2017-12-20 DIAGNOSIS — E162 Hypoglycemia, unspecified: Secondary | ICD-10-CM

## 2017-12-20 LAB — PROCALCITONIN: Procalcitonin: 2.22 ng/mL

## 2017-12-20 LAB — BLOOD GAS, ARTERIAL
Acid-Base Excess: 3.4 mmol/L — ABNORMAL HIGH (ref 0.0–2.0)
Bicarbonate: 27 mmol/L (ref 20.0–28.0)
Drawn by: 44166
FIO2: 40
O2 SAT: 99.9 %
PEEP: 5 cmH2O
Patient temperature: 97.4
RATE: 16 resp/min
VT: 530 mL
pCO2 arterial: 36.3 mmHg (ref 32.0–48.0)
pH, Arterial: 7.48 — ABNORMAL HIGH (ref 7.350–7.450)
pO2, Arterial: 142 mmHg — ABNORMAL HIGH (ref 83.0–108.0)

## 2017-12-20 LAB — BASIC METABOLIC PANEL
Anion gap: 9 (ref 5–15)
BUN: 27 mg/dL — ABNORMAL HIGH (ref 8–23)
CO2: 27 mmol/L (ref 22–32)
CREATININE: 2.84 mg/dL — AB (ref 0.61–1.24)
Calcium: 8 mg/dL — ABNORMAL LOW (ref 8.9–10.3)
Chloride: 99 mmol/L (ref 98–111)
GFR calc Af Amer: 25 mL/min — ABNORMAL LOW (ref >60)
GFR calc non Af Amer: 21 mL/min — ABNORMAL LOW (ref >60)
Glucose, Bld: 84 mg/dL (ref 70–99)
Potassium: 3.7 mmol/L (ref 3.5–5.1)
Sodium: 135 mmol/L (ref 135–145)

## 2017-12-20 LAB — CBC
HCT: 31.1 % — ABNORMAL LOW (ref 39.0–52.0)
HEMOGLOBIN: 9.4 g/dL — AB (ref 13.0–17.0)
MCH: 27.3 pg (ref 26.0–34.0)
MCHC: 30.2 g/dL (ref 30.0–36.0)
MCV: 90.4 fL (ref 78.0–100.0)
Platelets: 79 10*3/uL — ABNORMAL LOW (ref 150–400)
RBC: 3.44 MIL/uL — ABNORMAL LOW (ref 4.22–5.81)
RDW: 21.3 % — ABNORMAL HIGH (ref 11.5–15.5)
WBC: 9.3 10*3/uL (ref 4.0–10.5)

## 2017-12-20 LAB — GLUCOSE, CAPILLARY
GLUCOSE-CAPILLARY: 76 mg/dL (ref 70–99)
GLUCOSE-CAPILLARY: 58 mg/dL — AB (ref 70–99)
Glucose-Capillary: 86 mg/dL (ref 70–99)
Glucose-Capillary: 81 mg/dL (ref 70–99)

## 2017-12-20 LAB — PROTIME-INR
INR: 2.84
Prothrombin Time: 29.6 seconds — ABNORMAL HIGH (ref 11.4–15.2)

## 2017-12-20 MED ORDER — WARFARIN SODIUM 2.5 MG PO TABS: 2.5000 mg | ORAL_TABLET | Freq: Once | ORAL | Status: DC

## 2017-12-20 MED ORDER — ORAL CARE MOUTH RINSE
15.0000 mL | Freq: Two times a day (BID) | OROMUCOSAL | Status: DC
  Administered 2017-12-20 – 2017-12-21 (×2): 15 mL via OROMUCOSAL

## 2017-12-20 MED ORDER — FENTANYL CITRATE (PF) 100 MCG/2ML IJ SOLN: 50.0000 ug | INTRAMUSCULAR | Status: DC | PRN

## 2017-12-20 MED ORDER — ORAL CARE MOUTH RINSE: 15.0000 mL | Freq: Two times a day (BID) | OROMUCOSAL | Status: DC

## 2017-12-20 MED ORDER — CHLORHEXIDINE GLUCONATE 0.12 % MT SOLN: 15.0000 mL | Freq: Two times a day (BID) | OROMUCOSAL | Status: DC

## 2017-12-20 NOTE — Progress Notes (Signed)
PULMONARY / CRITICAL CARE MEDICINE   Name: Patrick [name] MRN: [id number] DOB: 1950/10/09    ADMISSION DATE:  12/18/2017 CONSULTATION DATE:  12/18/17  REFERRING MD:  Ellender Hose  CHIEF COMPLAINT:  AMS  HISTORY OF PRESENT ILLNESS:  Pt is encephelopathic; therefore, this HPI is obtained from chart review. Patrick [name] is a 67 y.o. male with PMH as outlined below and who resides in a nursing home. Had recent admission 12/07/17 through 12/17/17 for V.fib arrest (had cardiac cath that showed obstructive CAD with 70% occlusion first diagonal, 70% first O, 90% left PDA, 100% non-dominant RCA, normal LVEDP).  Discharged back to nursing home with repeat labs in a few weeks.  Presented to [facility] ED 7/22 after staff at facility were unable to arouse him.  CBG was noted to be 19.  He was given 1g glucagon and 25mg  D10 with some improvement.  In ED, he had GCS fluctuating from 6 - 9; therefore, he was intubated. CXR concerning for HCAP and SBP low in 70s and 80s but responded to IVF's.    SUBJECTIVE:   No acute issues.  Following commands this AM.  Attempted SBT but had low minute ventilation. EEG with slowing without epileptiform activity; however, multiple episodes of tongue twitching.  7/24 The patient is doing well. He arouses fairly easily. Has done several hours on CPAP and is doing well. Does not have a lot of resp secretions. Has TV consistently >400cc. Follows commands. Not on any sedationpresently   VITAL SIGNS: BP (!) 88/58   Pulse 73   Temp 98.4 F (36.9 C) (Oral)   Resp 19   Ht 5\' 7"  (1.702 m)   Wt 187 lb 6.3 oz (85 kg)   SpO2 100%   BMI 29.35 kg/m   HEMODYNAMICS:    VENTILATOR SETTINGS: Vent Mode: PSV;CPAP FiO2 (%):  [40 %] 40 % Set Rate:  [16 bmp] 16 bmp Vt Set:  [530 mL] 530 mL PEEP:  [5 cmH20] 5 cmH20 Pressure Support:  [10 cmH20] 10 cmH20 Plateau Pressure:  [15 cmH20-17 cmH20] 17 cmH20  INTAKE / OUTPUT: I/O last 3 completed shifts: In: 3263.1 [I.V.:1506.3; NG/GT:1321.3; IV  Piggyback:435.6] Out: 1586 [Other:986; Stool:600]   PHYSICAL EXAMINATION: General: Elderly male, chronically ill appearing, resting in bed, in NAD. Neuro: Somnolent but follows basic commands. HEENT: [place]/AT. Sclerae anicteric. Cardiovascular: RRR, no M/R/G.  Lungs: Respirations even and unlabored.  CTA bilaterally, No W/R/R. Abdomen: Ostomy bag in place.  BS x 4, soft, NT/ND.  Musculoskeletal: No gross deformities, no edema.  Skin: Warm, dry, no rashes.   LABS:  BMET Recent Labs  Lab 12/18/17 1031 12/18/17 1042 12/19/17 0433 12/20/17 0229  NA 136 136 136 135  K 5.2* 5.2* 4.2 3.7  CL 98 98 100 99  CO2 20*  --  27 27  BUN 25* 29* 39* 27*  CREATININE 4.10* 4.00* 4.27* 2.84*  GLUCOSE 103* 96 106* 84    Electrolytes Recent Labs  Lab 12/18/17 1031  12/18/17 1632 12/19/17 0433 12/19/17 2009 12/20/17 0229  CALCIUM 9.1  --   --  8.7*  --  8.0*  MG  --    < > 2.0 1.8 1.8  --   PHOS  --    < > 4.6 4.2 1.8*  --    < > = values in this interval not displayed.    CBC Recent Labs  Lab 12/19/17 0433 12/19/17 2009 12/20/17 0229  WBC 8.4 9.8 9.3  HGB 9.1* 9.8* 9.4*  HCT 30.3* 31.9* 31.1*  PLT 74* 88* 79*    Coag's Recent Labs  Lab 12/18/17 1338 12/19/17 0433 12/20/17 0229  INR 3.76 3.89 2.84    Sepsis Markers Recent Labs  Lab 12/18/17 1042 12/18/17 1338 12/18/17 1632 12/19/17 0433 12/20/17 0229  LATICACIDVEN 7.85* 5.3* 4.0*  --   --   PROCALCITON  --  0.99  --  1.95 2.22    ABG Recent Labs  Lab 12/18/17 1235 12/19/17 0555 12/20/17 0515  PHART 7.347* 7.416 7.480*  PCO2ART 38.5 39.1 36.3  PO2ART 113.0* 122* 142*    Liver Enzymes Recent Labs  Lab 12/18/17 1031  AST 458*  ALT 180*  ALKPHOS 138*  BILITOT 2.5*  ALBUMIN 2.7*    Cardiac Enzymes Recent Labs  Lab 12/18/17 1338 12/18/17 2055  TROPONINI 0.18* 0.18*    Glucose Recent Labs  Lab 12/19/17 0752 12/19/17 1142 12/19/17 1618 12/19/17 1936 12/19/17 2330 12/20/17 0328   GLUCAP 92 86 88 74 91 86    Imaging Dg Chest Port 1 View  Result Date: 12/20/2017 CLINICAL DATA:  Hypoxia EXAM: PORTABLE CHEST 1 VIEW COMPARISON:  December 19, 2017 FINDINGS: Endotracheal tube tip is essentially at the carina. Nasogastric tube tip and side port are below the diaphragm. Central catheter tip is in the right atrium. No pneumothorax. There is left lower lobe consolidation with small left pleural effusion. There is atelectatic change in the right base. Heart is mildly enlarged with pulmonary vascularity normal. No bone lesions. IMPRESSION: Tube and catheter positions as described without pneumothorax. Endotracheal tube tip is essentially at the carina. Advise withdrawing endotracheal tube approximately 3 cm. There is left lower lobe consolidation with small left pleural effusion. There is mild right base atelectasis. There is stable cardiac prominence. Electronically Signed   By: Lowella Grip III M.D.   On: 12/20/2017 07:21     STUDIES:  CT head 7/22 > no hemorrhage or acute infarct.  Moderate chronic microvascular changes. EEG 7/22 > EEG with slowing without epileptiform activity; however, multiple episodes of tongue twitching.  CULTURES: Blood 7/22 >  Urine 7/22 >  Sputum 7/22 >   ANTIBIOTICS: Vanc 7/22 >  Cefepime 7/22 >   SIGNIFICANT EVENTS: 7/11 through 7/21 > admit. 7/22 > re-admitted.  LINES/TUBES: ETT 7/22 >   DISCUSSION: 67 y.o. male with recent admit for V.Fib arrest, discharged 7/21 to nursing home.  Re-admitted 7/22 with AMS due to hypoglycemia. Required intubation in ED for airway protection given poor GCS.  ASSESSMENT / PLAN:  PULMONARY A: Respiratory insufficiency - with inability to protect the airway; s/p intubation. Possible HCAP. Has obscuring of the left hemidiaphragm but his was also event as of 7/12.  he is on 40% FI02. Does not have a lot of secretions presently. The patient did fine on an extended weanoing trial. TVs 400-500cc.     CARDIOVASCULAR A:  Hx VF arrest HTN, HLD, dysrhythmia, obstructive CAD (cath from July with 70% occlusion first diagonal, 70% first O, 90% left PDA, 100% non-dominant RCA, normal LVEDP). Troponin bump - suspect demand. P:  Continue warfarin per pharmacy. Continue preadmission ranolazine, atorvastatin. Hold preadmission mexitil, lopressor. His arrest at this point appears to be primarily respiratory likely due to his hypoglycemia.  RENAL A:   ESRD - on HD TTS. P:   Nephrology following, planning for routine HD. BMP in AM.  GASTROINTESTINAL A:   GI prophylaxis. Nutrition. P:   SUP: famotidine. NPO. Continue TF's.  HEMATOLOGIC A:   VTE Prophylaxis. P:  SCD's / warfarin. CBC in AM.  INFECTIOUS A:   Possible HCAP. P:   Abx as above (vanc / cefepime).  Follow cultures as above. PCT algorithm to limit abx exposure. Recent blood cultures negative. Unclear if the patient has pneumonia. I will continue antibiotics. His procal is over 2.  ENDOCRINE A:   Hypoglycemia - s/p 1g glucagon, 25mg  D10.   P:   Continue D51/2 NS. CBG q4hrs.  NEUROLOGIC Patient off sedation. He appears to respond appropriately to our commands.       Interdisciplinary Family Meeting v Palliative Care Meeting:  Due by: 12/25/17.  CC time: 30 min.  Micheal Likens MD [facility] Pager: [phone]  Cell [phone]   12/20/2017, 11:34 AM

## 2017-12-20 NOTE — Plan of Care (Signed)
  Problem: Clinical Measurements: Goal: Ability to maintain clinical measurements within normal limits will improve Outcome: Progressing Goal: Will remain free from infection Outcome: Progressing Goal: Diagnostic test results will improve Outcome: Progressing Goal: Respiratory complications will improve Outcome: Progressing Goal: Cardiovascular complication will be avoided Outcome: Progressing   Problem: Activity: Goal: Risk for activity intolerance will decrease Outcome: Progressing   Problem: Nutrition: Goal: Adequate nutrition will be maintained Outcome: Progressing   Problem: Elimination: Goal: Will not experience complications related to bowel motility Outcome: Progressing Goal: Will not experience complications related to urinary retention Outcome: Progressing   Problem: Safety: Goal: Ability to remain free from injury will improve Outcome: Progressing   Problem: Fluid Volume: Goal: Hemodynamic stability will improve Outcome: Progressing   Problem: Clinical Measurements: Goal: Diagnostic test results will improve Outcome: Progressing Goal: Signs and symptoms of infection will decrease Outcome: Progressing   Problem: Respiratory: Goal: Ability to maintain adequate ventilation will improve Outcome: Progressing   Problem: Education: Goal: Knowledge of General Education information will improve Description Including pain rating scale, medication(s)/side effects and non-pharmacologic comfort measures Outcome: Not Progressing   Problem: Health Behavior/Discharge Planning: Goal: Ability to manage health-related needs will improve Outcome: Not Progressing   Problem: Coping: Goal: Level of anxiety will decrease Outcome: Not Progressing   Problem: Pain Managment: Goal: General experience of comfort will improve Outcome: Not Progressing   Problem: Skin Integrity: Goal: Risk for impaired skin integrity will decrease Outcome: Not Progressing

## 2017-12-20 NOTE — Progress Notes (Addendum)
Palliative:  I met today along with Dr. Melvia Heaps with Mr. Marsolek. Dr. Jonnie Finner explained that his body is too sick and too weak to continue with dialysis. We explained with his CHF that dialysis is too taxing and likely to cause more harm than benefit. Discussed that without dialysis his care would be focused on keeping him comfortable and happy for as long as he lives. We further discussed poor prognosis associated with stopping dialysis (did not discuss specific timeframe). Mr. Mathes says that he is not afraid to die. He denies any symptoms or discomfort. Discussed options for EOL care as hospice facility vs return to SNF with hospice. 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 He shares that he would like to be with his sister at [facility] and agrees to having hospice to assist to ensure comfort. Emotional support provided.   I called and updated sister, Karge, with Mr. Soderholm permission. Tuggle is devastated and tearful. I called [name]'s RN to explain the situation so they can help support Coudriet. When I called Rodas back she had Education officer, museum in the room with her. Answered their questions and updated on above conversation. Social worker assisting with connecting Kelter to her friends for further emotional support.   Exam: Alert and oriented, off vent, no distress, breathing comfortably on 2L.   Plan: - No further dialysis - DNR - Comfort focused care (minimized medications, d/c labs) - Likely return to [facility] but ONLY with confirmed hospice in place prior to d/c (his sister and only family is bed-bound there and he wishes to be with her)  20 min  Vinie Sill, NP Palliative Medicine Team Pager # [phone] (M-F 8a-5p) Team Phone # [phone] (Nights/Weekends)

## 2017-12-20 NOTE — Progress Notes (Signed)
[facility] Progress Note   Subjective:  On the vent, opens eyes to voice  Objective Vitals:   12/20/17 1100 12/20/17 1130 12/20/17 1145 12/20/17 1200  BP: (!) 88/58     Pulse: 73 69 76 75  Resp: 19 18 (!) 24 (!) 21  Temp:      TempSrc:      SpO2: 100% 92% 100% 100%  Weight:      Height:       Physical Exam General:NAD, chronically ill appearing male, intubated Heart:bradycardia, regular rhythm, 2/6 systolic murmur Lungs:mostly CTAB anteriorly  Abdomen:soft, NTND, colostomy present L side Extremities:1-2+ bilat thigh and lower abd wall edema Dialysis Access: R IJ TDC   CXR 7/23 - ? vasc congestion/ IS edema Home meds: - coumadin 3m hs/ lipitor 10/ lopressor 12.5 bid/ mexilitene 150 bid  - allopurinol 300 qd/ protonix 40 qd/ sl ntg prn/ mvi - wellbutrin 1054mqd/ remeron 7.5 hs  - baclofen 5 mg tid prn/ percocet prn - ranexa 500 bid  Dialysis Orders: [facility] (started May 2019) 4h 400/800 79kg 3K/ 2Ca bath Hep 2700 R IJ TDC - mircera 75 every 2 wks last 6/27    Assessment/Plan: 1. AMS - may be improving, multifactorial infection/ ICU/ esrd 2. Hypoglycemia - requiring IV dextrose to keep BS's up 3. VDRF/ possible HCAP- on IV abx 4. ESRD - TTS HD.  Significant FTT and wt loss since starting HD in May.  Also has had VTach arrest x1 and resp failure on vent x 2 in last month.  Will d/w pall care and patient later today, poor candidate for continued dialysis. Would recommend transition to comfort/ hospice care.  5. Anemia of CKD- Hgb 12.9. No indication for ESA at this time 6. Volume - bilat edema, +5kg but BP's soft 7. Hx A. Fib - per primary 8. Recent VT arrest/ severe CM EF 15-20% recent admit 9. Hx colon cancer    RoKelly SplinterD [facility] pager [phone] 12/19/2017, 10:20 AM  Filed Weights   12/19/17 1340 12/19/17 1806 12/20/17 0436  Weight: 86.5 kg (190 lb 11.2 oz) 85.5 kg (188 lb 7.9 oz) 85 kg (187 lb 6.3  oz)    Intake/Output Summary (Last 24 hours) at 12/20/2017 1222 Last data filed at 12/20/2017 1100 Gross per 24 hour  Intake 2417.64 ml  Output 1586 ml  Net 831.64 ml    Additional Objective Labs: Basic Metabolic Panel: Recent Labs  Lab 12/18/17 1031 12/18/17 1042  12/18/17 1632 12/19/17 0433 12/19/17 2009 12/20/17 0229  NA 136 136  --   --  136  --  135  K 5.2* 5.2*  --   --  4.2  --  3.7  CL 98 98  --   --  100  --  99  CO2 20*  --   --   --  27  --  27  GLUCOSE 103* 96  --   --  106*  --  84  BUN 25* 29*  --   --  39*  --  27*  CREATININE 4.10* 4.00*  --   --  4.27*  --  2.84*  CALCIUM 9.1  --   --   --  8.7*  --  8.0*  PHOS  --   --    < > 4.6 4.2 1.8*  --    < > = values in this interval not displayed.   Liver Function Tests: Recent Labs  Lab  12/18/17 1031  AST 458*  ALT 180*  ALKPHOS 138*  BILITOT 2.5*  PROT 7.0  ALBUMIN 2.7*   No results for input(s): LIPASE, AMYLASE in the last 168 hours. CBC: Recent Labs  Lab 12/16/17 0442 12/17/17 0032 12/18/17 1031  12/19/17 0433 12/19/17 2009 12/20/17 0229  WBC 6.7 8.1 7.8  --  8.4 9.8 9.3  NEUTROABS 5.0  --  6.5  --   --   --   --   HGB 9.2* 9.4* 10.5*   < > 9.1* 9.8* 9.4*  HCT 30.5* 30.6* 36.5*   < > 30.3* 31.9* 31.1*  MCV 88.4 87.2 93.6  --  89.9 89.4 90.4  PLT 122* 144* 87*  --  74* 88* 79*   < > = values in this interval not displayed.   Blood Culture    Component Value Date/Time   SDES BLOOD LEFT ARM 12/18/2017 1326   SPECREQUEST  12/18/2017 1326    BOTTLES DRAWN AEROBIC ONLY Blood Culture adequate volume   CULT  12/18/2017 1326    NO GROWTH < 24 HOURS Performed at [facility], [place] [address], [place], [place] [address]    REPTSTATUS PENDING 12/18/2017 1326    Cardiac Enzymes: Recent Labs  Lab 12/18/17 1338 12/18/17 2055  TROPONINI 0.18* 0.18*   CBG: Recent Labs  Lab 12/19/17 1618 12/19/17 1936 12/19/17 2330 12/20/17 0328 12/20/17 1151  GLUCAP 88 74 91 86 76   Iron  Studies: No results for input(s): IRON, TIBC, TRANSFERRIN, FERRITIN in the last 72 hours. Lab Results  Component Value Date   INR 2.84 12/20/2017   INR 3.89 12/19/2017   INR 3.76 12/18/2017    Medications: . sodium chloride    . sodium chloride    . ceFEPime (MAXIPIME) IV Stopped (12/19/17 1913)  . dextrose 40 mL/hr at 12/20/17 1100  . famotidine (PEPCID) IV Stopped (12/20/17 8416)  . vancomycin Stopped (12/19/17 1249)   . atorvastatin  10 mg Per Tube q1800  . chlorhexidine gluconate (MEDLINE KIT)  15 mL Mouth Rinse BID  . Chlorhexidine Gluconate Cloth  6 each Topical Daily  . Chlorhexidine Gluconate Cloth  6 each Topical Q0600  . fentaNYL (SUBLIMAZE) injection  50 mcg Intravenous Once  . mouth rinse  15 mL Mouth Rinse 10 times per day  . mexiletine  150 mg Per Tube BID  . ranolazine  500 mg Oral BID  . warfarin  2.5 mg Oral ONCE-1800  . Warfarin - Pharmacist Dosing Inpatient   Does not apply [phone]

## 2017-12-20 NOTE — Progress Notes (Signed)
Patrick [name] for warfarin Indication: atrial fibrillation  Allergies  Allergen Reactions  . Nsaids     Kidney disease    Patient Measurements: Height: 5\' 7"  (170.2 cm) Weight: 187 lb 6.3 oz (85 kg) IBW/kg (Calculated) : 66.1  Vital Signs: Temp: 98.4 F (36.9 C) (07/24 0719) Temp Source: Oral (07/24 0719) BP: 88/58 (07/24 1100) Pulse Rate: 73 (07/24 1100)  Labs: Recent Labs    12/18/17 1042 12/18/17 1338 12/18/17 2055 12/19/17 0433 12/19/17 2009 12/20/17 0229  HGB 12.9*  --   --  9.1* 9.8* 9.4*  HCT 38.0*  --   --  30.3* 31.9* 31.1*  PLT  --   --   --  74* 88* 79*  LABPROT  --  36.9*  --  37.8*  --  29.6*  INR  --  3.76  --  3.89  --  2.84  CREATININE 4.00*  --   --  4.27*  --  2.84*  TROPONINI  --  0.18* 0.18*  --   --   --     Estimated Creatinine Clearance: 25.6 mL/min (A) (by C-G formula based on SCr of 2.84 mg/dL (H)).   Medical History: Past Medical History:  Diagnosis Date  . Arthritis    "right hand; right knee" (06/19/2014)  . CAD (coronary artery disease)    2v CAD with subtotal occlusion of small co-dominant RCA and borderline lesion in moderate-sized OM-1  . Chronic systolic (congestive) heart failure ([place])    Preserved EF 2016, has been between 25% and 45% since then  . CKD (chronic kidney disease), stage III ([place])   . Coronary artery disease involving native coronary artery of native heart with angina pectoris ([place]) 09/11/2016  . Degenerative joint disease of knee, right   . Dyspnea   . Dysrhythmia   . Enlarged prostate   . Hyperlipidemia   . Hypertension   . Pneumonia 05/2014  . Prediabetes 10/03/2014  . Scrotal edema 04/03/2015  . SMALL BOWEL OBSTRUCTION, HX OF 08/21/2007   Annotation: with narrowing in the ileocecal region Qualifier: Diagnosis of  By: Hassell Done FNP, Tori Milks      Assessment: 69 yoM on warfarin PTA for hx of atrial fibrillation admitted with AMS and respiratory distress. Pharmacy consulted to  resume warfarin. INR on admit elevated at 3.76, last dose of warfarin was taken 7/21 at 1800. INR today is therapeutic at 2.84.   Pt discharged on 2mg  daily - given elevated INR likely needs lower dose moving forward. Will give 2.5mg  tonight to prevent acute drop in INR <2.0.  Goal of Therapy:  INR 2-3 Monitor platelets by anticoagulation protocol: Yes   Plan:  -Warfarin 2.5 mg PO x1 -Daily protime  Arrie Senate, PharmD, BCPS Clinical Pharmacist [phone] Please check AMION for all [facility] Pharm[facility] numbers 12/20/2017

## 2017-12-20 NOTE — Procedures (Signed)
Extubation Procedure Note  Patient Details:   Name: DAMARI SUASTEGUI DOB: 07/13/1950 MRN: [id number]   Airway Documentation:    Vent end date: 12/20/17 Vent end time: 1142   Evaluation  O2 sats: stable throughout Complications: No apparent complications Patient did tolerate procedure well. Bilateral Breath Sounds: Clear, Diminished   Pt extubated per MD order.  Pt had +cuff leak. Pt able to voice and has strong cough. No distress noted at this time  Ciro Backer 12/20/2017, 11:44 AM

## 2017-12-21 ENCOUNTER — Other Ambulatory Visit: Payer: Self-pay

## 2017-12-21 ENCOUNTER — Encounter (HOSPITAL_COMMUNITY): Payer: Self-pay | Admitting: *Deleted

## 2017-12-21 DIAGNOSIS — I472 Ventricular tachycardia: Secondary | ICD-10-CM

## 2017-12-21 DIAGNOSIS — R092 Respiratory arrest: Secondary | ICD-10-CM

## 2017-12-21 DIAGNOSIS — E43 Unspecified severe protein-calorie malnutrition: Secondary | ICD-10-CM

## 2017-12-21 LAB — GLUCOSE, CAPILLARY
GLUCOSE-CAPILLARY: 98 mg/dL (ref 70–99)
GLUCOSE-CAPILLARY: 67 mg/dL — AB (ref 70–99)
GLUCOSE-CAPILLARY: 132 mg/dL — AB (ref 70–99)
GLUCOSE-CAPILLARY: 84 mg/dL (ref 70–99)
GLUCOSE-CAPILLARY: 84 mg/dL (ref 70–99)
Glucose-Capillary: 73 mg/dL (ref 70–99)
Glucose-Capillary: 87 mg/dL (ref 70–99)

## 2017-12-21 MED ORDER — MIRTAZAPINE 15 MG PO TABS: 7.5000 mg | ORAL_TABLET | Freq: Every day | ORAL | Status: DC

## 2017-12-21 MED ORDER — OXYCODONE HCL 5 MG/5ML PO SOLN: 5.0000 mg | ORAL | 0 refills | Status: AC | PRN

## 2017-12-21 MED ORDER — OXYCODONE HCL 5 MG/5ML PO SOLN: 5.0000 mg | ORAL | Status: DC | PRN

## 2017-12-21 MED ORDER — BUPROPION HCL 100 MG PO TABS
100.0000 mg | ORAL_TABLET | Freq: Two times a day (BID) | ORAL | Status: DC
  Administered 2017-12-21: 100 mg via ORAL
  Filled 2017-12-21 (×2): qty 1

## 2017-12-21 MED ORDER — OXYCODONE HCL 5 MG PO TABS: 5.0000 mg | ORAL_TABLET | ORAL | 0 refills | Status: AC | PRN

## 2017-12-21 MED ORDER — OXYCODONE HCL 5 MG PO TABS
5.0000 mg | ORAL_TABLET | ORAL | Status: DC | PRN
Start: 2017-12-21 — End: 2017-12-21

## 2017-12-21 MED ORDER — METOPROLOL TARTRATE 12.5 MG HALF TABLET: 12.5000 mg | ORAL_TABLET | Freq: Two times a day (BID) | ORAL | Status: DC

## 2017-12-21 MED ORDER — ALLOPURINOL 300 MG PO TABS: 300.0000 mg | ORAL_TABLET | Freq: Every day | ORAL | Status: DC

## 2017-12-21 NOTE — Progress Notes (Signed)
CSW Received consult for pt to return to [facility] with hospice in place- CSW left message for admissions to discuss getting patient back and ensuring the hospice is already set up  CSW will continue to follow  Jorge Ny, [place] Social Worker [phone]

## 2017-12-21 NOTE — Progress Notes (Signed)
Report called to Annette Stable, Therapist, sports at [facility].

## 2017-12-21 NOTE — Progress Notes (Signed)
D/c pts IVs, left HD catheter. All discharge education provided. Pt had no further questions. Transport left with pt to [facility].

## 2017-12-21 NOTE — NC FL2 (Signed)
[place] LEVEL OF CARE SCREENING TOOL     IDENTIFICATION  Patient Name: Patrick [name] Birthdate: 09-Dec-1950 Sex: male Admission Date (Current Location): 12/18/2017  [facility] and [place] Number:  Herbalist and Address:  The . [facility], [place] [address] [place], [place] [address]      Provider Number: [id number]  Attending Physician Name and Address:  Tarry Kos, MD  Relative Name and Phone Number:       Current Level of Care: Hospital Recommended Level of Care: [place] Prior Approval Number:    Date Approved/Denied:   PASRR Number:    Discharge Plan: SNF    Current Diagnoses: Patient Active Problem List   Diagnosis Date Noted  . Protein-calorie malnutrition, severe 12/21/2017  . Hypoglycemia   . ESRD on dialysis ([place])   . Altered mental state 12/18/2017  . Pressure injury of skin 12/18/2017  . Malnutrition of moderate degree 12/16/2017  . Respiratory arrest ([place])   . VT (ventricular tachycardia) ([place]) 12/07/2017  . Shock circulatory ([place]) 10/06/2017  . Acute renal disease   . Compromised airway   . Acute respiratory failure with hypoxia ([place])   . Community acquired bacterial pneumonia 05/24/2017  . Renal cell carcinoma, left ([place]) 05/24/2017  . Liver cirrhosis ([place]) 05/24/2017  . Pneumonia 05/23/2017  . Balance problem 03/07/2017  . Pulmonary HTN ([place]) 11/10/2016  . History of resection of rectum 09/28/2016  . Rectal cancer ([place]) 09/28/2016  . Atrial fibrillation ([place]) 09/11/2016  . Coronary artery disease involving native coronary artery of native heart with angina pectoris ([place]) 09/11/2016  . Nonischemic cardiomyopathy ([place]) 09/11/2016  . Thrombocytopenia ([place]) 05/29/2016  . Adenocarcinoma of rectum s/p robotic APR/colostomy 09/28/2016 04/14/2016  . Chronic kidney disease (CKD), stage III (moderate) ([place]) 02/05/2016  . Abnormal finding on cardiovascular stress test 02/05/2016  .  Cardiomyopathy, ischemic 06/17/2015  . Acute on chronic combined systolic and diastolic CHF (congestive heart failure) ([place]) 06/17/2015  . Major depressive disorder, recurrent episode ([place]) 04/17/2015  . Hypokalemia 08/29/2014  . Other iron deficiency anemias   . Elevated troponin I measurement   . Encounter for central line placement 03/11/2014  . Obesity, unspecified 10/03/2013  . Dyslipidemia 08/13/2013  . MACULAR DEGENERATION 05/02/2008  . ERECTILE DYSFUNCTION 03/13/2007  . Essential hypertension 01/16/2007  . BPH (benign prostatic hyperplasia) 01/16/2007    Orientation RESPIRATION BLADDER Height & Weight     Self, Time, Situation, Place  Normal Continent Weight: 192 lb 3.9 oz (87.2 kg) Height:  5\' 7"  (170.2 cm)  BEHAVIORAL SYMPTOMS/MOOD NEUROLOGICAL BOWEL NUTRITION STATUS      Colostomy Diet(regular)  AMBULATORY STATUS COMMUNICATION OF NEEDS Skin   Extensive Assist Verbally PU Stage and Appropriate Care   PU Stage 2 Dressing: (on coccyx- foam dressing changes)                   Personal Care Assistance Level of Assistance  Bathing, Dressing Bathing Assistance: Maximum assistance   Dressing Assistance: Maximum assistance     Functional Limitations Info             SPECIAL CARE FACTORS FREQUENCY                       Contractures      Additional Factors Info  Code Status, Allergies, Isolation Precautions Code Status Info: FULL Allergies Info: nsaids     Isolation Precautions Info: VRE, MRSA     Current  Medications (12/21/2017):  This is the current hospital active medication list Current Facility-Administered Medications  Medication Dose Route Frequency Provider Last Rate Last Dose  . 0.9 %  sodium chloride infusion  100 mL Intravenous PRN Penninger, Ria Comment, PA      . 0.9 %  sodium chloride infusion  100 mL Intravenous PRN Penninger, Ria Comment, PA      . allopurinol (ZYLOPRIM) tablet 300 mg  300 mg Oral Daily Tarry Kos, MD      .  alteplase (CATHFLO ACTIVASE) injection 2 mg  2 mg Intracatheter Once PRN Penninger, Ria Comment, PA      . bisacodyl (DULCOLAX) suppository 10 mg  10 mg Rectal Daily PRN Chesley Mires, MD      . buPROPion [facility]) tablet 100 mg  100 mg Oral BID Tarry Kos, MD   100 mg at 12/21/17 1046  . ceFEPIme (MAXIPIME) 2 g in sodium chloride 0.9 % 100 mL IVPB  2 g Intravenous Q T,Th,Sat-1800 Rumbarger, Valeda Malm, RPH   Stopped at 12/19/17 1913  . Chlorhexidine Gluconate Cloth 2 % PADS 6 each  6 each Topical Daily Desai, Rahul P, PA-C   6 each at 12/21/17 0900  . Chlorhexidine Gluconate Cloth 2 % PADS 6 each  6 each Topical Q0600 Penninger, [place], [place]   6 each at 12/21/17 0510  . dextrose 5 % solution   Intravenous Continuous Roney Jaffe, MD 40 mL/hr at 12/21/17 1200    . docusate (COLACE) 50 MG/5ML liquid 100 mg  100 mg Per Tube BID PRN Chesley Mires, MD      . famotidine (PEPCID) IVPB 20 mg premix  20 mg Intravenous Daily Desai, Rahul P, PA-C   Stopped at 12/21/17 0911  . fentaNYL (SUBLIMAZE) injection 50 mcg  50 mcg Intravenous L2G PRN Pershing Proud, NP      . MEDLINE mouth rinse  15 mL Mouth Rinse BID Tarry Kos, MD   15 mL at 12/21/17 0900  . metoprolol tartrate (LOPRESSOR) tablet 12.5 mg  12.5 mg Oral BID Tarry Kos, MD      . mexiletine (MEXITIL) capsule 150 mg  150 mg Per Tube BID Shearon Stalls, Rahul P, PA-C   150 mg at 12/21/17 0900  . mirtazapine (REMERON) tablet 7.5 mg  7.5 mg Oral QHS Tarry Kos, MD      . oxyCODONE (Oxy IR/ROXICODONE) immediate release tablet 5 mg  5 mg Oral M0N PRN Pershing Proud, NP       Or  . oxyCODONE (ROXICODONE) 5 MG/5ML solution 5 mg  5 mg Oral U2V PRN Pershing Proud, NP      . ranolazine (RANEXA) 12 hr tablet 500 mg  500 mg Oral BID Chesley Mires, MD   500 mg at 12/21/17 0900     Discharge Medications: Please see discharge summary for a list of discharge medications.  Relevant Imaging Results:  Relevant Lab  Results:   Additional Information SSN: [id number]; hopsice to follow at SNF  Jefry Lesinski, Connye Burkitt, LCSW

## 2017-12-21 NOTE — Progress Notes (Signed)
Patient will discharge to [facility] Anticipated discharge date: 7/25 Family notified: pt sister at [facility] Transportation by [facility]- scheduled for 2:30pm  CSW signing off.  Jorge Ny, LCSW Clinical Social Worker [phone]

## 2017-12-21 NOTE — Progress Notes (Signed)
PULMONARY / CRITICAL CARE MEDICINE   Name: Patrick [name] MRN: [id number] DOB: 1951/05/14    ADMISSION DATE:  12/18/2017 CONSULTATION DATE:  12/18/17  REFERRING MD:  Ellender Hose  CHIEF COMPLAINT:  AMS  HISTORY OF PRESENT ILLNESS:  Pt is encephelopathic; therefore, this HPI is obtained from chart review. Patrick [name] is a 67 y.o. male with PMH as outlined below and who resides in a nursing home. Had recent admission 12/07/17 through 12/17/17 for V.fib arrest (had cardiac cath that showed obstructive CAD with 70% occlusion first diagonal, 70% first O, 90% left PDA, 100% non-dominant RCA, normal LVEDP).  Discharged back to nursing home with repeat labs in a few weeks.  Presented to [facility] ED 7/22 after staff at facility were unable to arouse him.  CBG was noted to be 19.  He was given 1g glucagon and 25mg  D10 with some improvement.  In ED, he had GCS fluctuating from 6 - 9; therefore, he was intubated. CXR concerning for HCAP and SBP low in 70s and 80s but responded to IVF's.    SUBJECTIVE:   No acute issues.  Following commands this AM.  Attempted SBT but had low minute ventilation. EEG with slowing without epileptiform activity; however, multiple episodes of tongue twitching.  7/24 The patient is doing well. He arouses fairly easily. Has done several hours on CPAP and is doing well. Does not have a lot of resp secretions. Has TV consistently >400cc. Follows commands. Not on any sedation presently.  7/25 The patient appears comfortable. He apparently had a l;ong discussion with the palliative service yesterday and the patient has opted for hospice care at this point.    VITAL SIGNS: BP 106/68   Pulse 76   Temp 97.8 F (36.6 C) (Oral)   Resp (!) 21   Ht 5\' 7"  (1.702 m)   Wt 192 lb 3.9 oz (87.2 kg)   SpO2 95%   BMI 30.11 kg/m     INTAKE / OUTPUT: I/O last 3 completed shifts: In: 3552.6 [P.O.:840; I.V.:1392.7; NG/GT:1226.3; IV Piggyback:93.7] Out: 870 [Stool:870]   PHYSICAL  EXAMINATION: General: Elderly male, chronically ill appearing, resting in bed, in NAD. Neuro: Somnolent but follows basic commands. HEENT: [place]/AT. Sclerae anicteric. Cardiovascular: RRR, no M/R/G.  Lungs: Respirations even and unlabored.  CTA bilaterally, No W/R/R. Abdomen: Ostomy bag in place.  BS x 4, soft, NT/ND.  Musculoskeletal: No gross deformities, no edema.  Skin: Warm, dry, no rashes.   LABS:  BMET Recent Labs  Lab 12/18/17 1031 12/18/17 1042 12/19/17 0433 12/20/17 0229  NA 136 136 136 135  K 5.2* 5.2* 4.2 3.7  CL 98 98 100 99  CO2 20*  --  27 27  BUN 25* 29* 39* 27*  CREATININE 4.10* 4.00* 4.27* 2.84*  GLUCOSE 103* 96 106* 84    Electrolytes Recent Labs  Lab 12/18/17 1031  12/18/17 1632 12/19/17 0433 12/19/17 2009 12/20/17 0229  CALCIUM 9.1  --   --  8.7*  --  8.0*  MG  --    < > 2.0 1.8 1.8  --   PHOS  --    < > 4.6 4.2 1.8*  --    < > = values in this interval not displayed.    CBC Recent Labs  Lab 12/19/17 0433 12/19/17 2009 12/20/17 0229  WBC 8.4 9.8 9.3  HGB 9.1* 9.8* 9.4*  HCT 30.3* 31.9* 31.1*  PLT 74* 88* 79*    Coag's Recent Labs  Lab 12/18/17 1338 12/19/17  9924 12/20/17 0229  INR 3.76 3.89 2.84    Sepsis Markers Recent Labs  Lab 12/18/17 1042 12/18/17 1338 12/18/17 1632 12/19/17 0433 12/20/17 0229  LATICACIDVEN 7.85* 5.3* 4.0*  --   --   PROCALCITON  --  0.99  --  1.95 2.22    ABG Recent Labs  Lab 12/18/17 1235 12/19/17 0555 12/20/17 0515  PHART 7.347* 7.416 7.480*  PCO2ART 38.5 39.1 36.3  PO2ART 113.0* 122* 142*    Liver Enzymes Recent Labs  Lab 12/18/17 1031  AST 458*  ALT 180*  ALKPHOS 138*  BILITOT 2.5*  ALBUMIN 2.7*    Cardiac Enzymes Recent Labs  Lab 12/18/17 1338 12/18/17 2055  TROPONINI 0.18* 0.18*    Glucose Recent Labs  Lab 12/20/17 1724 12/20/17 1944 12/20/17 2342 12/21/17 0404 12/21/17 0639 12/21/17 0728  GLUCAP 81 98 87 67* 73 132*    Imaging No results  found.   STUDIES:  CT head 7/22 > no hemorrhage or acute infarct.  Moderate chronic microvascular changes. EEG 7/22 > EEG with slowing without epileptiform activity; however, multiple episodes of tongue twitching.  CULTURES: Blood 7/22 >  Urine 7/22 >  Sputum 7/22 >   ANTIBIOTICS: Vanc 7/22 >  Cefepime 7/22 >   SIGNIFICANT EVENTS: 7/11 through 7/21 > admit. 7/22 > re-admitted.  LINES/TUBES: ETT 7/22 >   DISCUSSION: 67 y.o. male with recent admit for V.Fib arrest, discharged 7/21 to nursing home.  Re-admitted 7/22 with AMS due to hypoglycemia. Required intubation in ED for airway protection given poor GCS.  ASSESSMENT / PLAN:  PULMONARY Respiratory status quite stable Patient not on any supplemental 02     CARDIOVASCULAR A:  Hx VF arrest HTN, HLD, dysrhythmia, obstructive CAD (cath from July with 70% occlusion first diagonal, 70% first O, 90% left PDA, 100% non-dominant RCA, normal LVEDP). Troponin bump - suspect demand ischemoia    RENAL Patient as noted in palliative care notede has elected to stop dialysis.   GASTROINTESTINAL A:   GI prophylaxis. Nutrition. P:   SUP: famotidine. NPO. Continue TF's.  HEMATOLOGIC A:   VTE Prophylaxis. P:  SCD's / warfarin. CBC in AM.  INFECTIOUS A:   Possible HCAP. P:   Abx as above (vanc / cefepime).  Follow cultures as above. PCT algorithm to limit abx exposure. Recent blood cultures negative. Unclear if the patient has pneumonia. I will continue antibiotics. His procal is over 2.  .  NEUROLOGIC Patient off sedation. He appears to respond appropriately to our commands.       Interdisciplinary Family Meeting v Palliative Care Meeting:  Due by: 12/25/17.  I have restarted most of thepatient's at home medications at this point.    Micheal Likens MD [place] [facility] Pager: [phone]  Cell [phone]   12/21/2017, 10:10 AM

## 2017-12-21 NOTE — Discharge Summary (Signed)
Physician Discharge Summary  Patient ID: Patrick [name] MRN: [id number] DOB/AGE: February 09, 1951 67 y.o.  Admit date: 12/18/2017 Discharge date: 12/21/2017    Discharge Diagnoses:  Acute Metabolic Encephalopathy Profound Hypoglycemia  Acute Respiratory Failure HCAP  Anemia  Recent VT Arrest  Severe Cardiomyopathy - LVEF 15-20% ESRD on HD  Hx Colon Cancer                                                                      DISCHARGE PLAN BY DIAGNOSIS      Acute Metabolic Encephalopathy Profound Hypoglycemia  Acute Respiratory Failure HCAP  Anemia  Recent VT Arrest  Severe Cardiomyopathy - LVEF 15-20% ESRD on HD  Hx Colon Cancer    Discharge Plan: Full comfort measures with hospice care PRN oxycodone for pain or shortness of breath  No further hemodialysis  DNR / DNI  Hold further anticoagulation  Oxygen as needed for comfort                      DISCHARGE SUMMARY   67 y/o M, SNF resident, with a PMH of ESRD on HD (T/Th/S), anemia, atrial fibrillation and severe cardiomyopathy with LVEF 15-20% who presented to [name] ER on 7/22 after being found at his facility unresponsive.    He had a recent admission from 7/11-7/21 for VF arrest.  Cardiac cath at that time showed obstructive CAD with 70% occlusion first diagonal, 70% first O, 90% left PDA, 100% non-dominant RCA, normal LVEDP). He was discharged back to the nursing home with plan for repeat labs.     On 7/22 he was found altered at the SNF. His glucose was found to be 19 at the time.  He was treated with 1gm glucagon and 25 mg D10 with some improvement.  In the ER, he had a fluctuating GCS of 6-9 and required intubation.  Initial work up concerning for HCAP and hypotension which responded to IVF.  The patient was supported in ICU.  He was empirically treated for possible PNA.  Nephrology was consulted.  He was liberated from mechanical ventilation 7/24.  Nephrology felt the patient was too ill to continue hemodialysis.   Palliative care consulted and after discussion, the patient elected to no longer continue hemodialysis and return to SNF with hospice care.              STUDIES:  CT head 7/22 > no hemorrhage or acute infarct.  Moderate chronic microvascular changes. EEG 7/22 > EEG with slowing without epileptiform activity; however, multiple episodes of tongue twitching.  CULTURES: Blood 7/22 >  Urine 7/22 > negative  Sputum 7/22 >   ANTIBIOTICS: Vanc 7/22 > 7/25 Cefepime 7/22 > 7/25  SIGNIFICANT EVENTS: 7/11 through 7/21 > admit. 7/22 > re-admitted.  LINES/TUBES: ETT 7/22 > 7/24  Discharge Exam: General: chronically ill appearing male in NAD, sitting up in chair  Neuro: AAOx4, speech clear, MAE / generalized weakness  CV: s1s2, SEM 2/6 PULM: non-labored on O2, lungs bilaterally with crackles at bases  GI: obese/soft, bsx4 active  Extremities: warm/dry, pale  Vitals:   12/21/17 0700 12/21/17 0800 12/21/17 0900 12/21/17 1000  BP:  107/66 106/68 119/79  Pulse: 70 86 76 62  Resp: (!) 29  17 (!) 21 20  Temp:   97.8 F (36.6 C)   TempSrc:   Oral   SpO2: 96% 96% 95% 98%  Weight:      Height:         Discharge Labs  BMET Recent Labs  Lab 12/16/17 0442 12/17/17 0032 12/18/17 1031 12/18/17 1042 12/18/17 1338 12/18/17 1632 12/19/17 0433 12/19/17 2009 12/20/17 0229  NA 135 132* 136 136  --   --  136  --  135  K 3.7 3.9 5.2* 5.2*  --   --  4.2  --  3.7  CL 96* 94* 98 98  --   --  100  --  99  CO2 27 24 20*  --   --   --  27  --  27  GLUCOSE 73 102* 103* 96  --   --  106*  --  84  BUN 23 32* 25* 29*  --   --  39*  --  27*  CREATININE 4.27* 5.05* 4.10* 4.00*  --   --  4.27*  --  2.84*  CALCIUM 8.9 8.9 9.1  --   --   --  8.7*  --  8.0*  MG 1.7 1.7  --   --  1.9 2.0 1.8 1.8  --   PHOS  --   --   --   --  4.6 4.6 4.2 1.8*  --     CBC Recent Labs  Lab 12/19/17 0433 12/19/17 2009 12/20/17 0229  HGB 9.1* 9.8* 9.4*  HCT 30.3* 31.9* 31.1*  WBC 8.4 9.8 9.3  PLT 74* 88* 79*     Anti-Coagulation Recent Labs  Lab 12/16/17 0442 12/17/17 0032 12/18/17 1338 12/19/17 0433 12/20/17 0229  INR 1.60 2.16 3.76 3.89 2.84    Discharge Instructions    Call MD for:  difficulty breathing, headache or visual disturbances   Complete by:  As directed    Call MD for:  extreme fatigue   Complete by:  As directed    Call MD for:  hives   Complete by:  As directed    Call MD for:  persistant dizziness or light-headedness   Complete by:  As directed    Call MD for:  persistant nausea and vomiting   Complete by:  As directed    Call MD for:  redness, tenderness, or signs of infection (pain, swelling, redness, odor or green/yellow discharge around incision site)   Complete by:  As directed    Call MD for:  severe uncontrolled pain   Complete by:  As directed    Call MD for:  temperature >100.4   Complete by:  As directed    Diet general   Complete by:  As directed    Discharge instructions   Complete by:  As directed    Review medications carefully as they have changed.  Hospice / comfort focused care. Oxycodone for pain / shortness of breath.   Increase activity slowly   Complete by:  As directed      Contact information for after-discharge care    [facility] .   Service:  Skilled Nursing Contact information: [place] [phone]               Allergies as of 12/21/2017      Reactions   Nsaids    Kidney disease      Medication List    STOP  taking these medications   atorvastatin 10 MG tablet Commonly known as:  LIPITOR   Baclofen 5 MG Tabs   oxyCODONE-acetaminophen 5-325 MG tablet Commonly known as:  PERCOCET/ROXICET   warfarin 2 MG tablet Commonly known as:  COUMADIN     TAKE these medications   acetaminophen 500 MG tablet Commonly known as:  TYLENOL Take 1,000 mg by mouth every 6 (six) hours as needed (for pain/fever/headaches.).   allopurinol 300 MG  tablet Commonly known as:  ZYLOPRIM take 1 tablet by mouth once daily   buPROPion 100 MG tablet Commonly known as:  WELLBUTRIN take 1 tablet by mouth twice a day   feeding supplement (NEPRO CARB STEADY) Liqd Take 237 mLs by mouth 3 (three) times daily between meals.   feeding supplement (PRO-STAT SUGAR FREE 64) Liqd Take 30 mLs by mouth 3 (three) times daily with meals.   Melatonin 5 MG Tabs Take 5 mg by mouth at bedtime.   metoprolol tartrate 25 MG tablet Commonly known as:  LOPRESSOR Take 0.5 tablets (12.5 mg total) by mouth 2 (two) times daily.   mexiletine 150 MG capsule Commonly known as:  MEXITIL Take 1 capsule (150 mg total) by mouth 2 (two) times daily.   mirtazapine 7.5 MG tablet Commonly known as:  REMERON Take 1 tablet (7.5 mg total) by mouth at bedtime.   multivitamin with minerals tablet Take 1 tablet by mouth daily. Men's 50+ Multivitamin   oxyCODONE 5 MG immediate release tablet Commonly known as:  Oxy IR/ROXICODONE Take 1 tablet (5 mg total) by mouth every 3 (three) hours as needed for moderate pain or severe pain (SOB).   oxyCODONE 5 MG/5ML solution Commonly known as:  ROXICODONE Take 5 mLs (5 mg total) by mouth every 3 (three) hours as needed for moderate pain or severe pain (SOB).   pantoprazole 40 MG tablet Commonly known as:  PROTONIX Take 1 tablet (40 mg total) by mouth daily.   ranolazine 500 MG 12 hr tablet Commonly known as:  RANEXA Take 1 tablet (500 mg total) by mouth 2 (two) times daily.         Disposition:  [place] with hospice care.   Discharged Condition: Patrick [name] has met maximum benefit of inpatient care and is medically stable and cleared for discharge.  Patient is pending follow up as above.      Time spent on disposition: 35 Minutes.   Signed: Noe Gens, NP-C [facility] Pgr: [phone] Office: [phone]

## 2017-12-21 NOTE — Progress Notes (Signed)
Spoke with MD Debbora Dus concerning patients HD catheter. MD stated to keep perm HD catheter in patient and remove PIVs. RN will continue to monitor.

## 2017-12-23 LAB — CULTURE, BLOOD (ROUTINE X 2)
Culture: NO GROWTH
Culture: NO GROWTH
SPECIAL REQUESTS: ADEQUATE
Special Requests: ADEQUATE

## 2017-12-25 ENCOUNTER — Telehealth: Payer: Self-pay | Admitting: *Deleted

## 2017-12-25 NOTE — Telephone Encounter (Signed)
Pt's sister called to let us know that her Brother passed on Aug 28, 2022 December 24, 2017 Patrick [name], Patrick [name], Patrick [name]

## 2017-12-28 DEATH — deceased

## 2018-10-03 ENCOUNTER — Other Ambulatory Visit: Payer: Self-pay | Admitting: *Deleted

## 2018-11-12 IMAGING — MR MR PELVIS WO/W CM
4 of 8 series · 17 of 48 positions shown · IV contrast (multihance)
Comparison: None.

CLINICAL DATA: Newly diagnosed rectal adenocarcinoma.

EXAM:
MRI PELVIS WITHOUT AND WITH CONTRAST
TECHNIQUE: Multiplanar multisequence MR imaging of the pelvis was performed
both before and after administration of intravenous contrast.
CONTRAST:  20mL MULTIHANCE GADOBENATE DIMEGLUMINE 529 MG/ML IV SOLN

[Series 2: T2 · sagittal · 3.0mm · 0.69mm/px · 5 of 36 slices shown (1 of 4)]
[im 1/36]
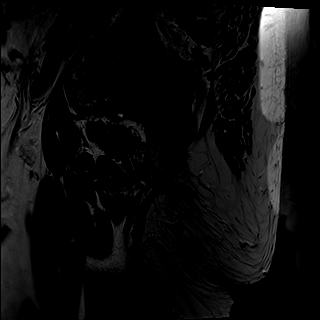
[im 9/36]
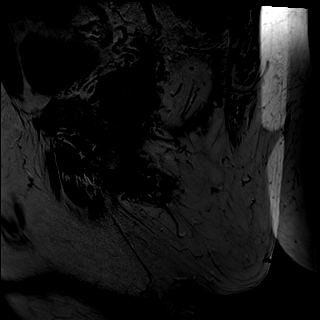
[im 18/36]
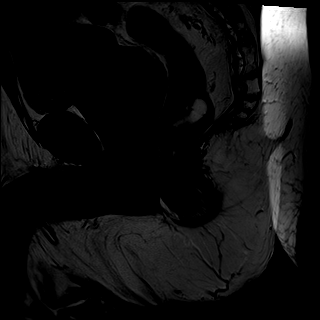
[im 27/36]
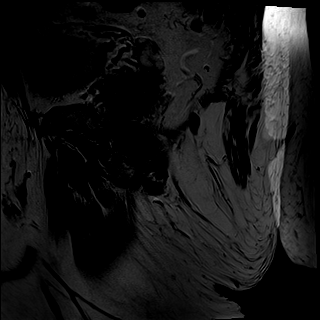
[im 36/36]
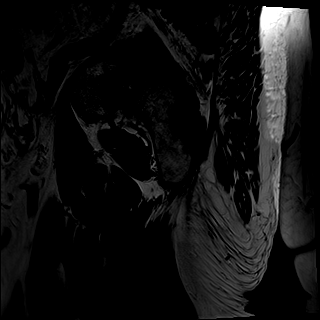

[Series 3: T2 · axial · 3.0mm · 0.38mm/px · z∈[-74,+32]mm · 5 of 40 slices shown (2 of 4)]
[im 1/40]
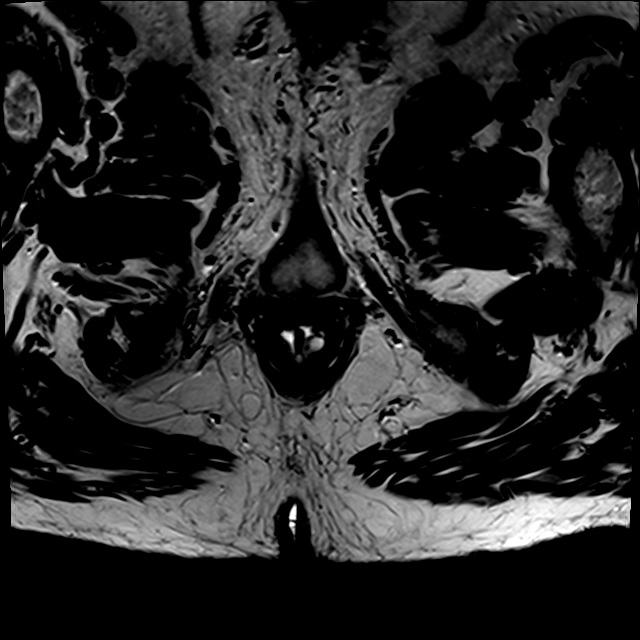
[im 10/40]
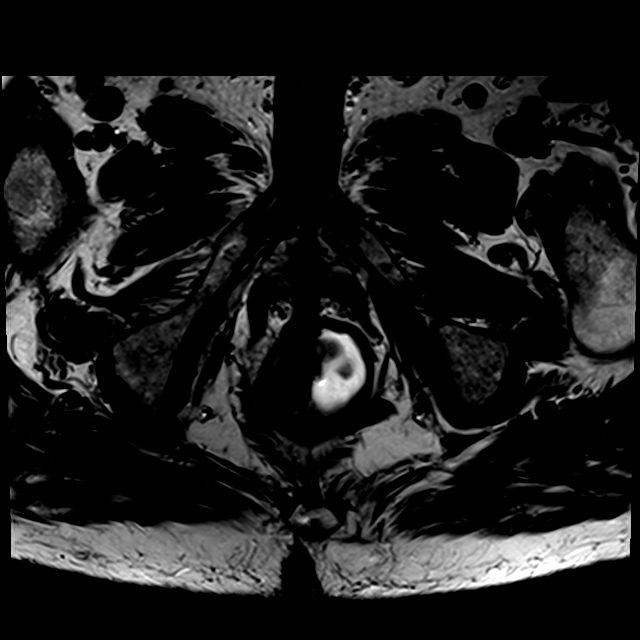
[im 20/40]
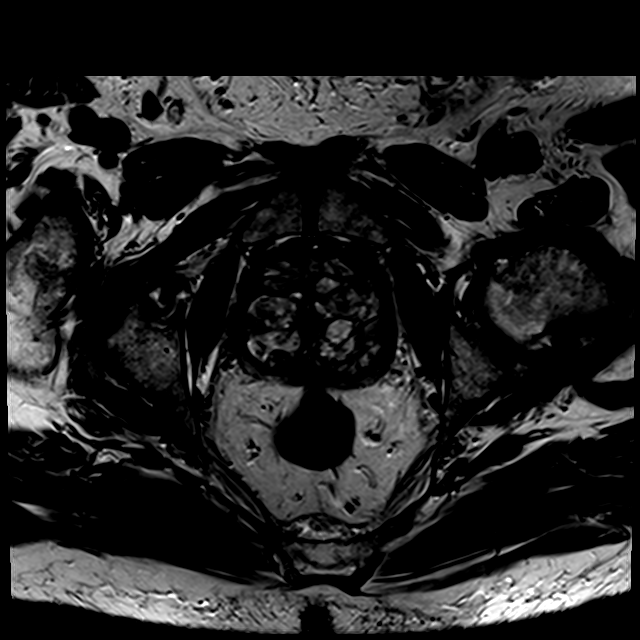
[im 30/40]
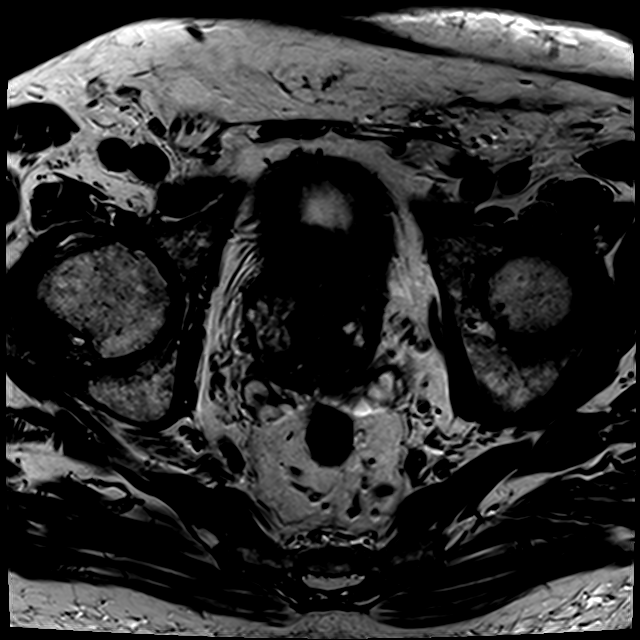
[im 40/40]
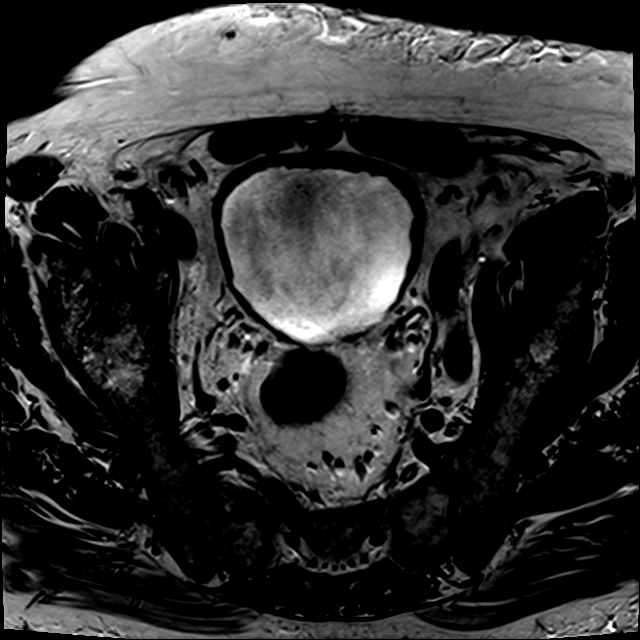

[Series 4: T2 · coronal · 3.0mm · 0.38mm/px · 4 of 49 slices shown (3 of 4)]
[im 1/49]
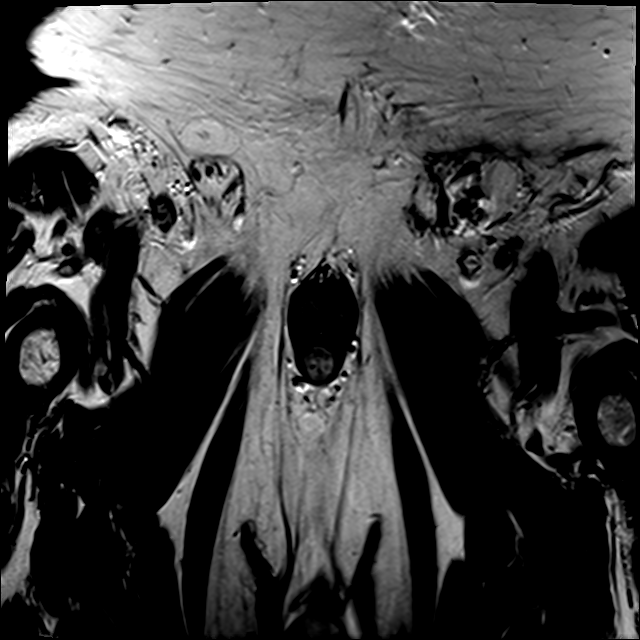
[im 9/49]
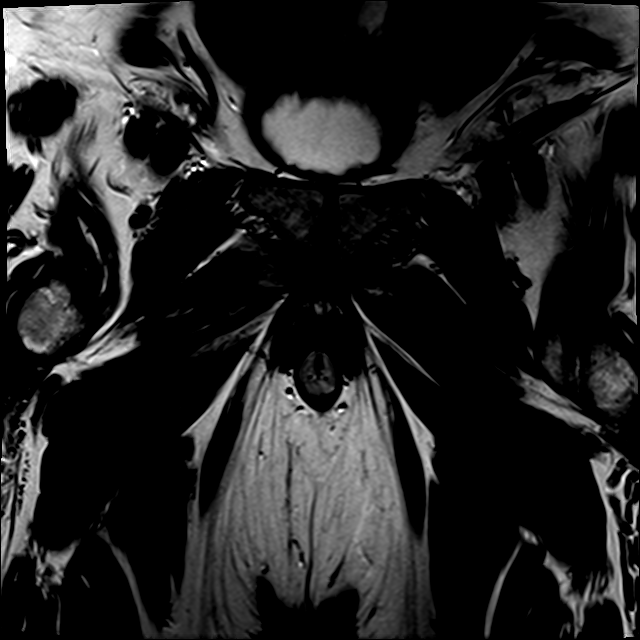
[im 25/49]
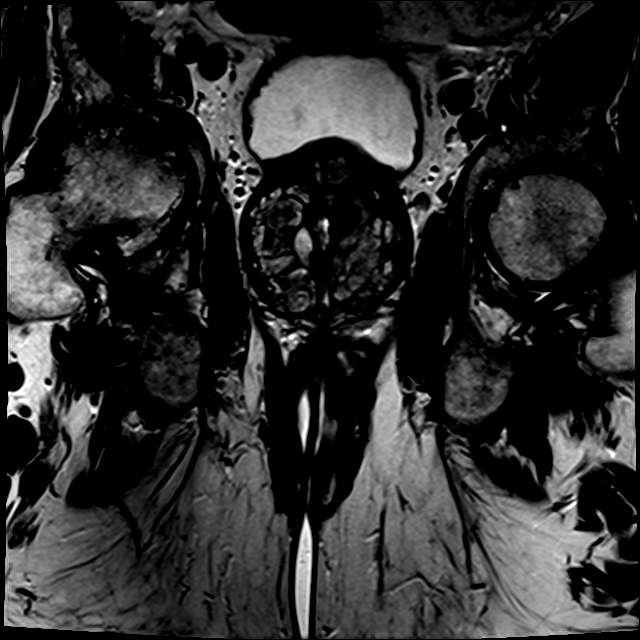
[im 41/49]
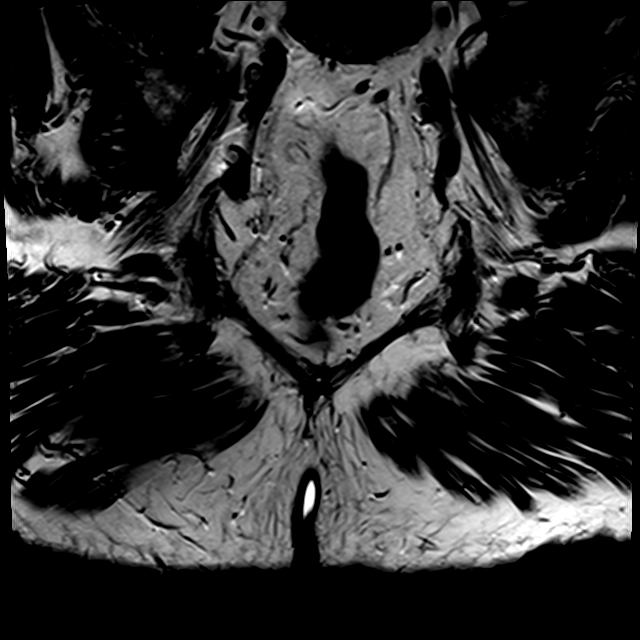

[Series 7: T2 · coronal · 3.0mm · 0.38mm/px · 3 of 49 slices shown (4 of 4)]
[im 9/49]
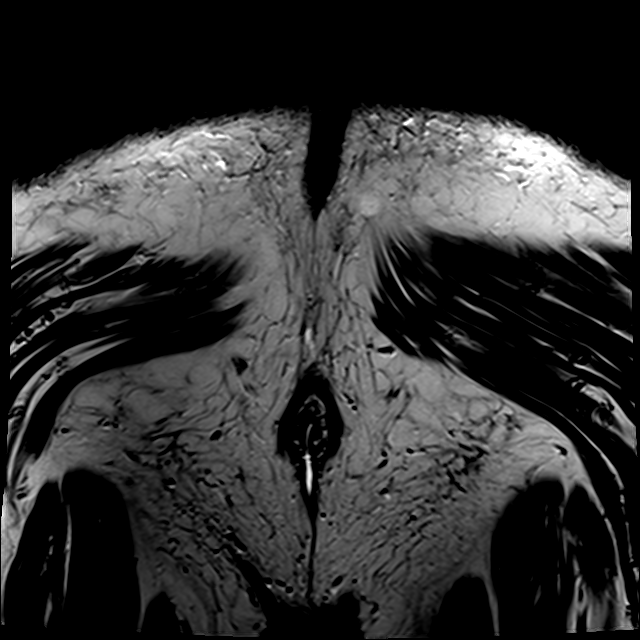
[im 25/49]
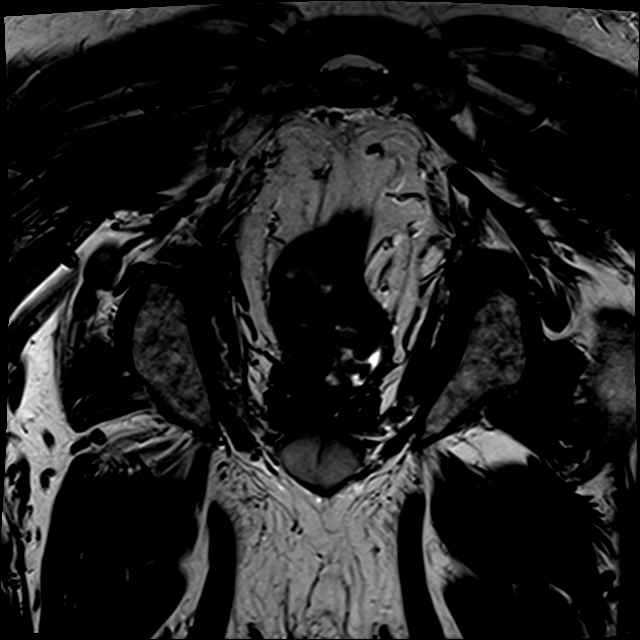
[im 41/49]
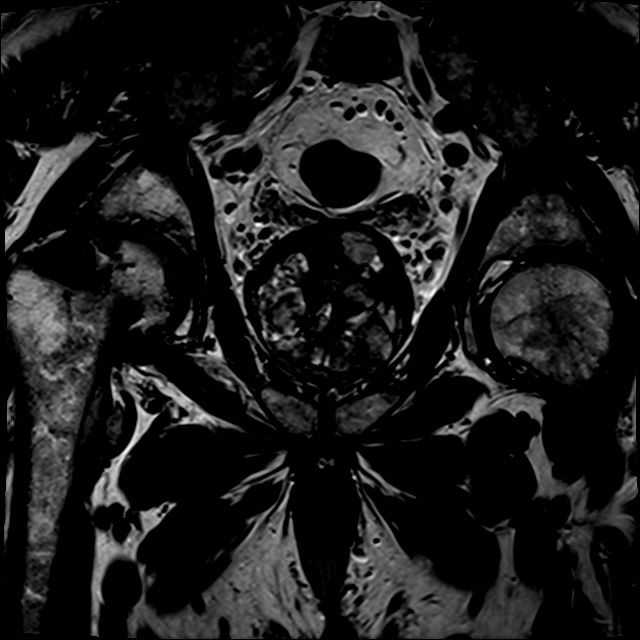

[17 of 48 positions shown; findings below may reference images not displayed]

FINDINGS: Location of Tumor in Rectum:  Low

Tumor Description: Nodular soft tissue mass is seen along the
anterior and right lateral walls of the rectum

Length of Tumor: 4.6 cm

Distance from inferior tumor margin to internal sphincter/anorectal
junction: <1 cm

Tumor Extension through Muscularis Propria: No (T2)

Adjacent Organ Involvement: No

Levator Ani Involvement:  No

Perirectal Lymph Nodes >=5mm: A left posterior perirectal lymph node
is seen measuring 9 mm on image [DATE]. A right posterior perirectal
lymph node is seen measuring 7 mm on image [DATE]. (N1)

Other: Mild to moderately enlarged prostate gland, with numerous BPH
nodules and mass effect on bladder base.
IMPRESSION: Low rectal adenocarcinoma, T2 N1 FALLON by imaging. Distance from
inferior tumor margin to anorectal junction is <1 cm.
